# Patient Record
Sex: Male | Born: 1950
Health system: Southern US, Community
[De-identification: ages and names within clinical notes are randomized; demographics above are authoritative.]

## PROBLEM LIST (undated history)

## (undated) DIAGNOSIS — C8330 Diffuse large B-cell lymphoma, unspecified site: Secondary | ICD-10-CM

## (undated) DIAGNOSIS — I10 Essential (primary) hypertension: Secondary | ICD-10-CM

## (undated) DIAGNOSIS — I5032 Chronic diastolic (congestive) heart failure: Secondary | ICD-10-CM

## (undated) DIAGNOSIS — E119 Type 2 diabetes mellitus without complications: Secondary | ICD-10-CM

## (undated) DIAGNOSIS — I872 Venous insufficiency (chronic) (peripheral): Secondary | ICD-10-CM

## (undated) DIAGNOSIS — C8580 Other specified types of non-Hodgkin lymphoma, unspecified site: Secondary | ICD-10-CM

## (undated) DIAGNOSIS — D509 Iron deficiency anemia, unspecified: Secondary | ICD-10-CM

## (undated) DIAGNOSIS — E782 Mixed hyperlipidemia: Secondary | ICD-10-CM

## (undated) DIAGNOSIS — R609 Edema, unspecified: Secondary | ICD-10-CM

## (undated) HISTORY — DX: Morbid (severe) obesity due to excess calories: E66.01

## (undated) HISTORY — DX: Other specified types of non-hodgkin lymphoma, unspecified site: C85.80

## (undated) HISTORY — PX: LIMBAL STEM CELL TRANSPLANT: SHX1969

## (undated) HISTORY — DX: Iron deficiency anemia, unspecified: D50.9

## (undated) HISTORY — DX: Venous insufficiency (chronic) (peripheral): I87.2

## (undated) HISTORY — DX: Chronic diastolic (congestive) heart failure: I50.32

## (undated) HISTORY — DX: Mixed hyperlipidemia: E78.2

## (undated) HISTORY — DX: Diffuse large B-cell lymphoma, unspecified site: C83.30

## (undated) HISTORY — DX: Edema, unspecified: R60.9

## (undated) HISTORY — PX: OTHER SURGICAL HISTORY: SHX169

## (undated) HISTORY — DX: Essential (primary) hypertension: I10

---

## 2004-08-24 HISTORY — PX: BACK SURGERY: SHX140

## 2004-10-20 ENCOUNTER — Ambulatory Visit: Payer: Self-pay | Admitting: Internal Medicine

## 2004-11-24 ENCOUNTER — Ambulatory Visit: Payer: Self-pay | Admitting: Internal Medicine

## 2004-11-24 ENCOUNTER — Ambulatory Visit (HOSPITAL_COMMUNITY): Admission: RE | Admit: 2004-11-24 | Discharge: 2004-11-24 | Payer: Self-pay | Admitting: Internal Medicine

## 2004-11-24 HISTORY — PX: COLONOSCOPY: SHX174

## 2005-01-05 ENCOUNTER — Ambulatory Visit: Payer: Self-pay | Admitting: Cardiology

## 2005-01-09 ENCOUNTER — Ambulatory Visit (HOSPITAL_COMMUNITY): Admission: RE | Admit: 2005-01-09 | Discharge: 2005-01-09 | Payer: Self-pay | Admitting: Family Medicine

## 2005-01-13 ENCOUNTER — Ambulatory Visit (HOSPITAL_COMMUNITY): Admission: RE | Admit: 2005-01-13 | Discharge: 2005-01-13 | Payer: Self-pay | Admitting: Family Medicine

## 2005-01-14 ENCOUNTER — Ambulatory Visit (HOSPITAL_COMMUNITY): Admission: RE | Admit: 2005-01-14 | Discharge: 2005-01-14 | Payer: Self-pay | Admitting: Family Medicine

## 2005-01-15 ENCOUNTER — Encounter (HOSPITAL_COMMUNITY): Admission: RE | Admit: 2005-01-15 | Discharge: 2005-02-14 | Payer: Self-pay | Admitting: Family Medicine

## 2005-01-20 ENCOUNTER — Encounter (HOSPITAL_COMMUNITY): Admission: RE | Admit: 2005-01-20 | Discharge: 2005-02-19 | Payer: Self-pay | Admitting: Cardiology

## 2005-01-20 ENCOUNTER — Ambulatory Visit: Payer: Self-pay | Admitting: *Deleted

## 2005-01-22 DIAGNOSIS — C8580 Other specified types of non-Hodgkin lymphoma, unspecified site: Secondary | ICD-10-CM

## 2005-01-22 HISTORY — PX: LUNG BIOPSY: SHX232

## 2005-01-22 HISTORY — DX: Other specified types of non-hodgkin lymphoma, unspecified site: C85.80

## 2005-02-02 ENCOUNTER — Encounter: Payer: Self-pay | Admitting: Emergency Medicine

## 2005-02-02 ENCOUNTER — Inpatient Hospital Stay (HOSPITAL_COMMUNITY): Admission: EM | Admit: 2005-02-02 | Discharge: 2005-02-10 | Payer: Self-pay | Admitting: Neurological Surgery

## 2005-02-03 ENCOUNTER — Encounter (INDEPENDENT_AMBULATORY_CARE_PROVIDER_SITE_OTHER): Payer: Self-pay | Admitting: *Deleted

## 2005-02-04 ENCOUNTER — Encounter: Payer: Self-pay | Admitting: Thoracic Surgery

## 2005-02-23 ENCOUNTER — Encounter: Admission: RE | Admit: 2005-02-23 | Discharge: 2005-02-23 | Payer: Self-pay | Admitting: Oncology

## 2005-02-23 ENCOUNTER — Encounter (HOSPITAL_COMMUNITY): Admission: RE | Admit: 2005-02-23 | Discharge: 2005-03-25 | Payer: Self-pay | Admitting: Oncology

## 2005-02-26 ENCOUNTER — Ambulatory Visit (HOSPITAL_COMMUNITY): Admission: RE | Admit: 2005-02-26 | Discharge: 2005-02-26 | Payer: Self-pay | Admitting: Psychiatry

## 2005-03-02 ENCOUNTER — Ambulatory Visit (HOSPITAL_COMMUNITY): Admission: RE | Admit: 2005-03-02 | Discharge: 2005-03-02 | Payer: Self-pay | Admitting: Thoracic Surgery

## 2005-03-06 ENCOUNTER — Ambulatory Visit (HOSPITAL_COMMUNITY): Payer: Self-pay | Admitting: Oncology

## 2005-03-10 ENCOUNTER — Encounter: Admission: EM | Admit: 2005-03-10 | Discharge: 2005-03-10 | Payer: Self-pay | Admitting: Dentistry

## 2005-03-10 ENCOUNTER — Ambulatory Visit: Payer: Self-pay | Admitting: Dentistry

## 2005-03-27 ENCOUNTER — Encounter (HOSPITAL_COMMUNITY): Admission: RE | Admit: 2005-03-27 | Discharge: 2005-04-26 | Payer: Self-pay | Admitting: Oncology

## 2005-03-27 ENCOUNTER — Encounter: Admission: RE | Admit: 2005-03-27 | Discharge: 2005-03-27 | Payer: Self-pay | Admitting: Oncology

## 2005-04-21 ENCOUNTER — Ambulatory Visit (HOSPITAL_COMMUNITY): Payer: Self-pay | Admitting: Oncology

## 2005-04-24 HISTORY — PX: MULTIPLE TOOTH EXTRACTIONS: SHX2053

## 2005-05-04 ENCOUNTER — Ambulatory Visit (HOSPITAL_COMMUNITY): Admission: RE | Admit: 2005-05-04 | Discharge: 2005-05-04 | Payer: Self-pay | Admitting: Oncology

## 2005-05-05 ENCOUNTER — Encounter (HOSPITAL_COMMUNITY): Admission: RE | Admit: 2005-05-05 | Discharge: 2005-05-22 | Payer: Self-pay | Admitting: Oncology

## 2005-05-05 ENCOUNTER — Encounter: Admission: RE | Admit: 2005-05-05 | Discharge: 2005-05-22 | Payer: Self-pay | Admitting: Oncology

## 2005-05-07 ENCOUNTER — Ambulatory Visit (HOSPITAL_COMMUNITY): Admission: RE | Admit: 2005-05-07 | Discharge: 2005-05-07 | Payer: Self-pay | Admitting: Dentistry

## 2005-05-07 ENCOUNTER — Ambulatory Visit: Payer: Self-pay | Admitting: Dentistry

## 2005-05-25 ENCOUNTER — Ambulatory Visit: Payer: Self-pay | Admitting: Dentistry

## 2005-06-02 ENCOUNTER — Encounter (HOSPITAL_COMMUNITY): Admission: RE | Admit: 2005-06-02 | Discharge: 2005-07-02 | Payer: Self-pay | Admitting: Oncology

## 2005-06-02 ENCOUNTER — Encounter: Admission: RE | Admit: 2005-06-02 | Discharge: 2005-06-02 | Payer: Self-pay | Admitting: Oncology

## 2005-06-17 ENCOUNTER — Ambulatory Visit (HOSPITAL_COMMUNITY): Payer: Self-pay | Admitting: Oncology

## 2005-07-10 ENCOUNTER — Encounter: Admission: RE | Admit: 2005-07-10 | Discharge: 2005-07-10 | Payer: Self-pay | Admitting: Oncology

## 2005-07-10 ENCOUNTER — Encounter (HOSPITAL_COMMUNITY): Admission: RE | Admit: 2005-07-10 | Discharge: 2005-08-09 | Payer: Self-pay | Admitting: Oncology

## 2005-07-13 ENCOUNTER — Encounter (HOSPITAL_COMMUNITY): Payer: Self-pay | Admitting: Oncology

## 2005-08-03 ENCOUNTER — Ambulatory Visit (HOSPITAL_COMMUNITY): Payer: Self-pay | Admitting: Oncology

## 2005-08-11 ENCOUNTER — Encounter (HOSPITAL_COMMUNITY): Admission: RE | Admit: 2005-08-11 | Discharge: 2005-08-13 | Payer: Self-pay | Admitting: Oncology

## 2005-08-11 ENCOUNTER — Encounter: Admission: RE | Admit: 2005-08-11 | Discharge: 2005-08-13 | Payer: Self-pay | Admitting: Oncology

## 2005-08-25 ENCOUNTER — Encounter: Admission: RE | Admit: 2005-08-25 | Discharge: 2005-08-25 | Payer: Self-pay | Admitting: Oncology

## 2005-08-25 ENCOUNTER — Encounter (HOSPITAL_COMMUNITY): Admission: RE | Admit: 2005-08-25 | Discharge: 2005-09-24 | Payer: Self-pay | Admitting: Oncology

## 2005-08-31 ENCOUNTER — Ambulatory Visit (HOSPITAL_COMMUNITY): Admission: RE | Admit: 2005-08-31 | Discharge: 2005-08-31 | Payer: Self-pay | Admitting: Oncology

## 2005-09-02 ENCOUNTER — Ambulatory Visit: Admission: RE | Admit: 2005-09-02 | Discharge: 2005-09-28 | Payer: Self-pay | Admitting: Radiation Oncology

## 2005-09-08 ENCOUNTER — Ambulatory Visit: Payer: Self-pay | Admitting: Internal Medicine

## 2005-09-09 ENCOUNTER — Ambulatory Visit: Payer: Self-pay | Admitting: Dentistry

## 2005-09-22 ENCOUNTER — Ambulatory Visit (HOSPITAL_COMMUNITY): Payer: Self-pay | Admitting: Oncology

## 2005-09-23 ENCOUNTER — Ambulatory Visit: Payer: Self-pay | Admitting: Dentistry

## 2005-10-06 ENCOUNTER — Encounter (HOSPITAL_COMMUNITY): Admission: RE | Admit: 2005-10-06 | Discharge: 2005-11-05 | Payer: Self-pay | Admitting: Oncology

## 2005-10-06 ENCOUNTER — Encounter: Admission: RE | Admit: 2005-10-06 | Discharge: 2005-10-06 | Payer: Self-pay | Admitting: Oncology

## 2005-10-15 ENCOUNTER — Ambulatory Visit: Payer: Self-pay | Admitting: Internal Medicine

## 2005-11-05 ENCOUNTER — Encounter: Admission: RE | Admit: 2005-11-05 | Discharge: 2005-11-05 | Payer: Self-pay | Admitting: Oncology

## 2005-11-05 ENCOUNTER — Encounter (HOSPITAL_COMMUNITY): Admission: RE | Admit: 2005-11-05 | Discharge: 2005-12-05 | Payer: Self-pay | Admitting: Oncology

## 2005-11-09 ENCOUNTER — Ambulatory Visit (HOSPITAL_COMMUNITY): Payer: Self-pay | Admitting: Oncology

## 2005-12-17 ENCOUNTER — Encounter: Admission: RE | Admit: 2005-12-17 | Discharge: 2005-12-17 | Payer: Self-pay | Admitting: Oncology

## 2006-01-01 ENCOUNTER — Ambulatory Visit (HOSPITAL_COMMUNITY): Payer: Self-pay | Admitting: Oncology

## 2006-01-29 ENCOUNTER — Encounter: Admission: RE | Admit: 2006-01-29 | Discharge: 2006-01-29 | Payer: Self-pay | Admitting: Oncology

## 2006-01-29 ENCOUNTER — Encounter (HOSPITAL_COMMUNITY): Admission: RE | Admit: 2006-01-29 | Discharge: 2006-02-28 | Payer: Self-pay | Admitting: Oncology

## 2006-04-05 ENCOUNTER — Encounter (HOSPITAL_COMMUNITY): Admission: RE | Admit: 2006-04-05 | Discharge: 2006-05-05 | Payer: Self-pay | Admitting: Oncology

## 2006-04-05 ENCOUNTER — Encounter: Admission: RE | Admit: 2006-04-05 | Discharge: 2006-04-05 | Payer: Self-pay | Admitting: Oncology

## 2006-04-05 ENCOUNTER — Ambulatory Visit (HOSPITAL_COMMUNITY): Payer: Self-pay | Admitting: Oncology

## 2006-05-07 ENCOUNTER — Encounter: Admission: RE | Admit: 2006-05-07 | Discharge: 2006-05-21 | Payer: Self-pay | Admitting: Oncology

## 2006-05-07 ENCOUNTER — Encounter (HOSPITAL_COMMUNITY): Admission: RE | Admit: 2006-05-07 | Discharge: 2006-05-21 | Payer: Self-pay | Admitting: Oncology

## 2006-06-01 ENCOUNTER — Ambulatory Visit (HOSPITAL_COMMUNITY): Payer: Self-pay | Admitting: Oncology

## 2006-06-01 ENCOUNTER — Encounter: Admission: RE | Admit: 2006-06-01 | Discharge: 2006-06-01 | Payer: Self-pay | Admitting: Oncology

## 2006-06-01 ENCOUNTER — Encounter (HOSPITAL_COMMUNITY): Admission: RE | Admit: 2006-06-01 | Discharge: 2006-07-01 | Payer: Self-pay | Admitting: Oncology

## 2006-06-07 ENCOUNTER — Ambulatory Visit: Payer: Self-pay | Admitting: Cardiology

## 2006-06-09 ENCOUNTER — Ambulatory Visit (HOSPITAL_COMMUNITY): Admission: RE | Admit: 2006-06-09 | Discharge: 2006-06-09 | Payer: Self-pay | Admitting: Cardiology

## 2006-06-09 ENCOUNTER — Ambulatory Visit: Payer: Self-pay | Admitting: Cardiology

## 2006-07-06 ENCOUNTER — Encounter: Admission: RE | Admit: 2006-07-06 | Discharge: 2006-07-06 | Payer: Self-pay | Admitting: Thoracic Surgery

## 2006-07-07 ENCOUNTER — Ambulatory Visit: Payer: Self-pay | Admitting: Cardiology

## 2006-07-29 ENCOUNTER — Ambulatory Visit: Admission: RE | Admit: 2006-07-29 | Discharge: 2006-07-29 | Payer: Self-pay | Admitting: Thoracic Surgery

## 2006-09-02 ENCOUNTER — Ambulatory Visit (HOSPITAL_COMMUNITY): Payer: Self-pay | Admitting: Oncology

## 2006-09-02 ENCOUNTER — Encounter (HOSPITAL_COMMUNITY): Admission: RE | Admit: 2006-09-02 | Discharge: 2006-10-02 | Payer: Self-pay | Admitting: Oncology

## 2006-09-07 ENCOUNTER — Ambulatory Visit (HOSPITAL_COMMUNITY): Admission: RE | Admit: 2006-09-07 | Discharge: 2006-09-07 | Payer: Self-pay | Admitting: Thoracic Surgery

## 2006-10-14 ENCOUNTER — Encounter (HOSPITAL_COMMUNITY): Admission: RE | Admit: 2006-10-14 | Discharge: 2006-11-13 | Payer: Self-pay | Admitting: Oncology

## 2006-10-18 ENCOUNTER — Ambulatory Visit (HOSPITAL_COMMUNITY): Payer: Self-pay | Admitting: Oncology

## 2006-10-26 ENCOUNTER — Ambulatory Visit: Payer: Self-pay | Admitting: Dentistry

## 2006-12-21 ENCOUNTER — Ambulatory Visit (HOSPITAL_COMMUNITY): Payer: Self-pay | Admitting: Oncology

## 2006-12-21 ENCOUNTER — Encounter (HOSPITAL_COMMUNITY): Admission: RE | Admit: 2006-12-21 | Discharge: 2007-01-20 | Payer: Self-pay | Admitting: Oncology

## 2007-01-31 ENCOUNTER — Ambulatory Visit: Payer: Self-pay | Admitting: Dentistry

## 2007-03-22 ENCOUNTER — Ambulatory Visit: Payer: Self-pay | Admitting: Dentistry

## 2007-04-21 ENCOUNTER — Encounter (HOSPITAL_COMMUNITY): Admission: RE | Admit: 2007-04-21 | Discharge: 2007-05-21 | Payer: Self-pay | Admitting: Oncology

## 2007-04-21 ENCOUNTER — Ambulatory Visit (HOSPITAL_COMMUNITY): Payer: Self-pay | Admitting: Oncology

## 2007-06-16 ENCOUNTER — Ambulatory Visit: Payer: Self-pay | Admitting: Dentistry

## 2007-06-30 ENCOUNTER — Ambulatory Visit (HOSPITAL_COMMUNITY): Payer: Self-pay | Admitting: Oncology

## 2007-07-25 ENCOUNTER — Ambulatory Visit: Payer: Self-pay | Admitting: Dentistry

## 2007-09-20 ENCOUNTER — Ambulatory Visit: Payer: Self-pay | Admitting: Dentistry

## 2007-09-22 ENCOUNTER — Ambulatory Visit (HOSPITAL_COMMUNITY): Payer: Self-pay | Admitting: Oncology

## 2007-09-22 ENCOUNTER — Encounter (HOSPITAL_COMMUNITY): Admission: RE | Admit: 2007-09-22 | Discharge: 2007-10-22 | Payer: Self-pay | Admitting: Oncology

## 2007-11-03 ENCOUNTER — Encounter (HOSPITAL_COMMUNITY): Admission: RE | Admit: 2007-11-03 | Discharge: 2007-12-03 | Payer: Self-pay | Admitting: Oncology

## 2007-11-10 ENCOUNTER — Ambulatory Visit (HOSPITAL_COMMUNITY): Payer: Self-pay | Admitting: Oncology

## 2008-01-18 ENCOUNTER — Ambulatory Visit (HOSPITAL_COMMUNITY): Payer: Self-pay | Admitting: Oncology

## 2008-02-20 ENCOUNTER — Ambulatory Visit: Payer: Self-pay | Admitting: Dentistry

## 2008-04-11 ENCOUNTER — Ambulatory Visit (HOSPITAL_COMMUNITY): Payer: Self-pay | Admitting: Oncology

## 2008-05-23 ENCOUNTER — Encounter (HOSPITAL_COMMUNITY): Admission: RE | Admit: 2008-05-23 | Discharge: 2008-06-22 | Payer: Self-pay | Admitting: Oncology

## 2008-05-30 ENCOUNTER — Ambulatory Visit (HOSPITAL_COMMUNITY): Payer: Self-pay | Admitting: Oncology

## 2008-10-11 ENCOUNTER — Ambulatory Visit (HOSPITAL_COMMUNITY): Payer: Self-pay | Admitting: Oncology

## 2008-10-19 ENCOUNTER — Ambulatory Visit (HOSPITAL_COMMUNITY): Admission: RE | Admit: 2008-10-19 | Discharge: 2008-10-19 | Payer: Self-pay | Admitting: Family Medicine

## 2008-10-24 ENCOUNTER — Ambulatory Visit (HOSPITAL_COMMUNITY): Admission: RE | Admit: 2008-10-24 | Discharge: 2008-10-24 | Payer: Self-pay | Admitting: Family Medicine

## 2008-11-01 ENCOUNTER — Ambulatory Visit: Payer: Self-pay | Admitting: Cardiology

## 2008-11-06 ENCOUNTER — Encounter: Payer: Self-pay | Admitting: Cardiology

## 2008-11-06 ENCOUNTER — Ambulatory Visit: Payer: Self-pay | Admitting: Cardiology

## 2008-11-06 ENCOUNTER — Ambulatory Visit (HOSPITAL_COMMUNITY): Admission: RE | Admit: 2008-11-06 | Discharge: 2008-11-06 | Payer: Self-pay | Admitting: Cardiology

## 2008-11-13 ENCOUNTER — Encounter (HOSPITAL_COMMUNITY): Admission: RE | Admit: 2008-11-13 | Discharge: 2008-12-13 | Payer: Self-pay | Admitting: Oncology

## 2008-12-03 ENCOUNTER — Ambulatory Visit: Payer: Self-pay | Admitting: Cardiology

## 2009-01-29 ENCOUNTER — Ambulatory Visit (HOSPITAL_COMMUNITY): Payer: Self-pay | Admitting: Oncology

## 2009-02-28 DIAGNOSIS — D649 Anemia, unspecified: Secondary | ICD-10-CM

## 2009-02-28 DIAGNOSIS — R609 Edema, unspecified: Secondary | ICD-10-CM

## 2009-02-28 DIAGNOSIS — E782 Mixed hyperlipidemia: Secondary | ICD-10-CM | POA: Insufficient documentation

## 2009-02-28 DIAGNOSIS — C8330 Diffuse large B-cell lymphoma, unspecified site: Secondary | ICD-10-CM

## 2009-02-28 DIAGNOSIS — I10 Essential (primary) hypertension: Secondary | ICD-10-CM | POA: Insufficient documentation

## 2009-02-28 DIAGNOSIS — E785 Hyperlipidemia, unspecified: Secondary | ICD-10-CM | POA: Insufficient documentation

## 2009-02-28 DIAGNOSIS — I5032 Chronic diastolic (congestive) heart failure: Secondary | ICD-10-CM

## 2009-02-28 HISTORY — DX: Diffuse large B-cell lymphoma, unspecified site: C83.30

## 2009-04-17 ENCOUNTER — Ambulatory Visit (HOSPITAL_COMMUNITY): Payer: Self-pay | Admitting: Oncology

## 2009-04-17 ENCOUNTER — Encounter (HOSPITAL_COMMUNITY): Admission: RE | Admit: 2009-04-17 | Discharge: 2009-05-17 | Payer: Self-pay | Admitting: Oncology

## 2009-04-24 ENCOUNTER — Encounter: Payer: Self-pay | Admitting: Cardiology

## 2009-06-05 ENCOUNTER — Ambulatory Visit (HOSPITAL_COMMUNITY): Payer: Self-pay | Admitting: Oncology

## 2009-08-22 ENCOUNTER — Ambulatory Visit (HOSPITAL_COMMUNITY): Payer: Self-pay | Admitting: Oncology

## 2009-10-23 ENCOUNTER — Telehealth (INDEPENDENT_AMBULATORY_CARE_PROVIDER_SITE_OTHER): Payer: Self-pay

## 2009-10-23 ENCOUNTER — Encounter (INDEPENDENT_AMBULATORY_CARE_PROVIDER_SITE_OTHER): Payer: Self-pay

## 2009-10-23 ENCOUNTER — Ambulatory Visit (HOSPITAL_COMMUNITY): Payer: Self-pay | Admitting: Oncology

## 2009-10-23 ENCOUNTER — Encounter: Payer: Self-pay | Admitting: Cardiology

## 2009-10-23 ENCOUNTER — Encounter: Payer: Self-pay | Admitting: Internal Medicine

## 2009-10-31 ENCOUNTER — Encounter (INDEPENDENT_AMBULATORY_CARE_PROVIDER_SITE_OTHER): Payer: Self-pay

## 2009-11-14 ENCOUNTER — Encounter (HOSPITAL_COMMUNITY): Admission: RE | Admit: 2009-11-14 | Discharge: 2009-12-14 | Payer: Self-pay | Admitting: Oncology

## 2009-12-30 ENCOUNTER — Ambulatory Visit (HOSPITAL_COMMUNITY): Payer: Self-pay | Admitting: Oncology

## 2010-03-24 ENCOUNTER — Ambulatory Visit (HOSPITAL_COMMUNITY): Payer: Self-pay | Admitting: Oncology

## 2010-04-29 ENCOUNTER — Encounter (HOSPITAL_COMMUNITY): Admission: RE | Admit: 2010-04-29 | Discharge: 2010-05-23 | Payer: Self-pay | Admitting: Oncology

## 2010-05-07 ENCOUNTER — Encounter: Payer: Self-pay | Admitting: Cardiology

## 2010-07-10 ENCOUNTER — Ambulatory Visit (HOSPITAL_COMMUNITY): Payer: Self-pay | Admitting: Oncology

## 2010-07-10 ENCOUNTER — Encounter (HOSPITAL_COMMUNITY)
Admission: RE | Admit: 2010-07-10 | Discharge: 2010-08-09 | Payer: Self-pay | Source: Home / Self Care | Attending: Oncology | Admitting: Oncology

## 2010-08-28 ENCOUNTER — Encounter (HOSPITAL_COMMUNITY)
Admission: RE | Admit: 2010-08-28 | Discharge: 2010-09-23 | Payer: Self-pay | Source: Home / Self Care | Attending: Oncology | Admitting: Oncology

## 2010-08-28 ENCOUNTER — Ambulatory Visit (HOSPITAL_COMMUNITY)
Admission: RE | Admit: 2010-08-28 | Discharge: 2010-09-23 | Payer: Self-pay | Source: Home / Self Care | Attending: Oncology | Admitting: Oncology

## 2010-09-14 ENCOUNTER — Encounter: Payer: Self-pay | Admitting: Thoracic Surgery

## 2010-09-14 ENCOUNTER — Encounter: Payer: Self-pay | Admitting: Cardiology

## 2010-09-14 ENCOUNTER — Encounter (HOSPITAL_COMMUNITY): Payer: Self-pay | Admitting: Oncology

## 2010-09-23 NOTE — Miscellaneous (Signed)
Summary: 11/24/2004   Colonoscopy with biopsy  Clinical Lists Changes NAME:  [name], Frank                  ACCOUNT NO.:  [id number]   MEDICAL RECORD NO.:  [id number]          PATIENT TYPE:  AMB   LOCATION:  DAY                           FACILITY:  [facility]   PHYSICIAN:  R. Roetta Sessions, M.D. DATE OF BIRTH:  [birth year]   DATE OF PROCEDURE:  11/24/2004  DATE OF DISCHARGE:                                 OPERATIVE REPORT   PROCEDURE PERFORMED:  Colonoscopy with biopsy and snare polypectomy.   INDICATIONS FOR PROCEDURE:  The patient is a 60 year old gentleman referred  by Patrica Duel, M.D. for colorectal cancer screening.  He had a  colonoscopy back in 1994.  He had some hyperplastic polyps taken out, no  family history of colorectal neoplasia.  He has no bowel symptoms.  Colonoscopy is now being done.  This approach has been discussed with the  patient at length,  Potential risks, benefits and alternatives have been  reviewed, questions answered, he is agreeable.  Please see documentation in  the medical record.   DESCRIPTION OF PROCEDURE:  Oxygen saturations, blood pressure, pulse and  respirations were monitored throughout the entire procedure.  Conscious  sedation with Versed 5 mg IV, Demerol 75 mg IVl.   INSTRUMENT USED:  Olympus video chip system.   FINDINGS:  Digital rectal examination revealed no abnormalities.   ENDOSCOPIC FINDINGS:  Prep was adequate.   Rectum:  Examination of the rectal mucosa including retroflex view of the  anal verge revealed no abnormalities.  Colon:  Colonic mucosa surveyed from the rectosigmoid junction through the  left, transverse, right colon to the area of the appendiceal orifice,  ileocecal valve and cecum.  These structures were well seen and photographed  for the record.  From this level the scope was slowly withdrawn.  All  previously mentioned mucosal surfaces were again seen.  At 30 cm there was a  diminutive 4 mm polyp which was removed  with cold biopsy forceps.  At 15 cm  there was a 7 mm somewhat pedunculated polyp which was removed  with cold  snare technique.  There was also two 2 cm submucosal lesions consistent with  lipomas.  One in the ascending colon and one in the mid descending colon  which were photographed but not manipulated.  The patient tolerated the  procedure well and was reacted in endoscopy.   IMPRESSION:  1.  Normal rectum.  2.  Polyps in the left colon ablated/removed as described above.  Two      submucosal lesions consistent with lipomas as described above not      manipulated.   RECOMMENDATIONS:  1.  Follow-up on pathology.  2.  Further recommendations to follow.      RMR/MEDQ  D:  11/24/2004  T:  11/24/2004  Job:  [id number]   cc:   Patrica Duel, M.D.  [address], [address]  [place]  [place] [address]  Fax: [phone]

## 2010-09-23 NOTE — Letter (Signed)
Summary: Internal Other/ Colon Path..11/2004  Internal Other/ Colon Path..11/2004   Imported By: Cloria Spring LPN 71/24/5809 98:33:82  _____________________________________________________________________  External Attachment:    Type:   Image     Comment:   External Document

## 2010-09-23 NOTE — Letter (Signed)
Summary: Recall Colonoscopy/Endoscopy, Change to Office Visit  [facility]  [address]   [place], [place] [address]   Phone: [phone]  Fax: [phone]      October 31, 2009   [facility] [address] [place], [place]  [address] [birth year]   Dear Mr. Adkison,   According to our records, it is time for you to schedule your next colonoscopy. Please call our office and ask to speak to the nurse to get triaged for scheduling your procedure.  You can reach [place] at [phone].   Sincerely,   Cloria Spring LPN  [facility] Ph: [phone]   Fax: [phone]

## 2010-09-23 NOTE — Letter (Signed)
Summary: [facility] Progress Note  [facility] Progress Note   Imported By: Roderic Ovens 11/28/2009 12:03:39  _____________________________________________________________________  External Attachment:    Type:   Image     Comment:   External Document

## 2010-09-23 NOTE — Letter (Signed)
Summary: [facility]  [facility]   Imported By: Marylou Mccoy 06/27/2010 09:47:49  _____________________________________________________________________  External Attachment:    Type:   Image     Comment:   External Document

## 2010-09-23 NOTE — Progress Notes (Signed)
Summary: Next TCS due 11/2009/ copies of TCS/Path of 11/2004 to Dr Lanae Boast  Phone Note Outgoing Call   Call placed by: Tyler Aas Call placed to: Patient Summary of Call: Per Dr. Jena Gauss, pt is due  next TCS in 11/2009. ( He is on April call back list). LMOM for pt to call and inform  him of that. Also,  paper copies of the Colonoscopy and path of 11/2004 were faxed to Dr. Mariel Sleet per Dr. Luvenia Starch request, for continuity  of care. Initial call taken by: Cloria Spring LPN,  October 23, 2009 4:44 PM     Appended Document: Next TCS due 11/2009/ copies of TCS/Path of 11/2004 to Dr Lanae Boast [facility] to call.  Appended Document: Next TCS due 11/2009/ copies of TCS/Path of 11/2004 to Dr Lanae Boast [facility] to call.

## 2010-10-09 ENCOUNTER — Other Ambulatory Visit (HOSPITAL_COMMUNITY): Payer: Medicare Other

## 2010-10-09 ENCOUNTER — Encounter (HOSPITAL_COMMUNITY): Payer: Medicare Other | Attending: Oncology

## 2010-10-09 DIAGNOSIS — C8589 Other specified types of non-Hodgkin lymphoma, extranodal and solid organ sites: Secondary | ICD-10-CM | POA: Insufficient documentation

## 2010-10-09 DIAGNOSIS — Z9481 Bone marrow transplant status: Secondary | ICD-10-CM | POA: Insufficient documentation

## 2010-10-09 DIAGNOSIS — Z9221 Personal history of antineoplastic chemotherapy: Secondary | ICD-10-CM | POA: Insufficient documentation

## 2010-10-09 DIAGNOSIS — Z452 Encounter for adjustment and management of vascular access device: Secondary | ICD-10-CM

## 2010-11-05 ENCOUNTER — Ambulatory Visit (HOSPITAL_COMMUNITY): Payer: Medicare Other | Admitting: Oncology

## 2010-11-05 DIAGNOSIS — C8589 Other specified types of non-Hodgkin lymphoma, extranodal and solid organ sites: Secondary | ICD-10-CM

## 2010-11-06 LAB — COMPREHENSIVE METABOLIC PANEL
Albumin: 3.7 g/dL (ref 3.5–5.2)
BUN: 7 mg/dL (ref 6–23)
Calcium: 8.6 mg/dL (ref 8.4–10.5)
Creatinine, Ser: 0.98 mg/dL (ref 0.4–1.5)
GFR calc Af Amer: >60 mL/min (ref >60)
GFR calc non Af Amer: >60 mL/min (ref >60)
Glucose, Bld: 101 mg/dL — ABNORMAL HIGH (ref 70–99)
Potassium: 3.5 mEq/L (ref 3.5–5.1)
Sodium: 140 mEq/L (ref 135–145)

## 2010-11-06 LAB — CBC
HCT: 36.4 % — ABNORMAL LOW (ref 39.0–52.0)
Hemoglobin: 12.3 g/dL — ABNORMAL LOW (ref 13.0–17.0)
MCH: 31.4 pg (ref 26.0–34.0)
MCHC: 33.8 g/dL (ref 30.0–36.0)
MCV: 92.9 fL (ref 78.0–100.0)
Platelets: 207 10*3/uL (ref 150–400)
RDW: 16.4 % — ABNORMAL HIGH (ref 11.5–15.5)

## 2010-11-06 LAB — DIFFERENTIAL
Basophils Absolute: 0 10*3/uL (ref 0.0–0.1)
Basophils Relative: 1 % (ref 0–1)
Monocytes Absolute: 0.7 10*3/uL (ref 0.1–1.0)
Monocytes Relative: 16 % — ABNORMAL HIGH (ref 3–12)
Neutro Abs: 2.4 10*3/uL (ref 1.7–7.7)

## 2010-11-06 LAB — LACTATE DEHYDROGENASE: LDH: 171 U/L (ref 94–250)

## 2010-11-06 LAB — SEDIMENTATION RATE: Sed Rate: 84 mm/hr — ABNORMAL HIGH (ref 0–16)

## 2010-11-16 LAB — COMPREHENSIVE METABOLIC PANEL
ALT: 17 U/L (ref 0–53)
AST: 17 U/L (ref 0–37)
Albumin: 3.7 g/dL (ref 3.5–5.2)
Alkaline Phosphatase: 72 U/L (ref 39–117)
Calcium: 9 mg/dL (ref 8.4–10.5)
GFR calc Af Amer: >60 mL/min (ref >60)
Potassium: 3.9 mEq/L (ref 3.5–5.1)
Sodium: 140 mEq/L (ref 135–145)
Total Protein: 6.1 g/dL (ref 6.0–8.3)

## 2010-11-16 LAB — CBC
HCT: 36.8 % — ABNORMAL LOW (ref 39.0–52.0)
Hemoglobin: 12.8 g/dL — ABNORMAL LOW (ref 13.0–17.0)
MCHC: 34.8 g/dL (ref 30.0–36.0)
MCV: 92.1 fL (ref 78.0–100.0)
Platelets: 189 10*3/uL (ref 150–400)
RBC: 3.99 MIL/uL — ABNORMAL LOW (ref 4.22–5.81)
RDW: 16.1 % — ABNORMAL HIGH (ref 11.5–15.5)
WBC: 3.9 10*3/uL — ABNORMAL LOW (ref 4.0–10.5)

## 2010-11-16 LAB — LACTATE DEHYDROGENASE: LDH: 154 U/L (ref 94–250)

## 2010-11-16 LAB — DIFFERENTIAL
Eosinophils Absolute: 0.1 10*3/uL (ref 0.0–0.7)
Eosinophils Relative: 3 % (ref 0–5)
Lymphs Abs: 1 10*3/uL (ref 0.7–4.0)
Monocytes Absolute: 0.5 10*3/uL (ref 0.1–1.0)
Monocytes Relative: 13 % — ABNORMAL HIGH (ref 3–12)
Neutrophils Relative %: 56 % (ref 43–77)

## 2010-11-20 ENCOUNTER — Encounter (HOSPITAL_COMMUNITY): Payer: Medicare Other

## 2010-11-20 DIAGNOSIS — Z452 Encounter for adjustment and management of vascular access device: Secondary | ICD-10-CM

## 2010-11-20 DIAGNOSIS — C8589 Other specified types of non-Hodgkin lymphoma, extranodal and solid organ sites: Secondary | ICD-10-CM

## 2010-11-29 LAB — CBC
HCT: 33.8 % — ABNORMAL LOW (ref 39.0–52.0)
Hemoglobin: 11.7 g/dL — ABNORMAL LOW (ref 13.0–17.0)
MCV: 94 fL (ref 78.0–100.0)
RDW: 16 % — ABNORMAL HIGH (ref 11.5–15.5)

## 2010-11-29 LAB — COMPREHENSIVE METABOLIC PANEL
Alkaline Phosphatase: 70 U/L (ref 39–117)
BUN: 8 mg/dL (ref 6–23)
Creatinine, Ser: 1.1 mg/dL (ref 0.4–1.5)
Glucose, Bld: 118 mg/dL — ABNORMAL HIGH (ref 70–99)
Potassium: 3.5 mEq/L (ref 3.5–5.1)
Total Bilirubin: 0.3 mg/dL (ref 0.3–1.2)
Total Protein: 5.4 g/dL — ABNORMAL LOW (ref 6.0–8.3)

## 2010-11-29 LAB — DIFFERENTIAL
Basophils Absolute: 0 10*3/uL (ref 0.0–0.1)
Basophils Relative: 0 % (ref 0–1)
Monocytes Relative: 15 % — ABNORMAL HIGH (ref 3–12)
Neutro Abs: 1.9 10*3/uL (ref 1.7–7.7)
Neutrophils Relative %: 54 % (ref 43–77)

## 2010-12-04 LAB — COMPREHENSIVE METABOLIC PANEL
ALT: 22 U/L (ref 0–53)
AST: 22 U/L (ref 0–37)
CO2: 26 mEq/L (ref 19–32)
Chloride: 107 mEq/L (ref 96–112)
GFR calc Af Amer: >60 mL/min (ref >60)
GFR calc non Af Amer: >60 mL/min (ref >60)
Sodium: 138 mEq/L (ref 135–145)
Total Bilirubin: 0.3 mg/dL (ref 0.3–1.2)

## 2010-12-04 LAB — CBC
RBC: 3.69 MIL/uL — ABNORMAL LOW (ref 4.22–5.81)
WBC: 3.5 10*3/uL — ABNORMAL LOW (ref 4.0–10.5)

## 2010-12-04 LAB — DIFFERENTIAL
Basophils Absolute: 0 10*3/uL (ref 0.0–0.1)
Eosinophils Absolute: 0.2 10*3/uL (ref 0.0–0.7)
Eosinophils Relative: 5 % (ref 0–5)
Lymphs Abs: 0.8 10*3/uL (ref 0.7–4.0)

## 2010-12-04 LAB — LACTATE DEHYDROGENASE: LDH: 174 U/L (ref 94–250)

## 2011-01-01 ENCOUNTER — Encounter (HOSPITAL_COMMUNITY): Payer: Medicare Other

## 2011-01-01 ENCOUNTER — Encounter (HOSPITAL_COMMUNITY): Payer: Medicare Other | Attending: Oncology

## 2011-01-01 DIAGNOSIS — Z452 Encounter for adjustment and management of vascular access device: Secondary | ICD-10-CM

## 2011-01-01 DIAGNOSIS — C8589 Other specified types of non-Hodgkin lymphoma, extranodal and solid organ sites: Secondary | ICD-10-CM | POA: Insufficient documentation

## 2011-01-01 DIAGNOSIS — Z9481 Bone marrow transplant status: Secondary | ICD-10-CM | POA: Insufficient documentation

## 2011-01-01 DIAGNOSIS — Z9221 Personal history of antineoplastic chemotherapy: Secondary | ICD-10-CM | POA: Insufficient documentation

## 2011-01-06 NOTE — Assessment & Plan Note (Signed)
[facility]                       [place] CARDIOLOGY OFFICE NOTE   [name], [name]                         MRN:          [id number]  DATE:12/03/2008                            DOB:          Oct 10, 1950    [name] comes in today for his chronic diastolic heart failure and  particularly his lower extremity edema.   On his last visit, we repeated a 2-D echocardiogram which showed  basically normal left ventricular chamber size and function, mild LVH,  no important valvular disease, mild right ventricular hypertrophy with  normal RV function, and no suggestion of pulmonary hypertension.   He takes torsemide 20 mg a day and potassium 20 mEq a day.  Apparently,  his potassium is low recently and Dr. Mariel Sleet added this supplement.  Unfortunately, he does not take his torsemide everyday.  His weight is  fluctuating as much as 10-12 pounds since last week.  He did not take  his torsemide yesterday because of church and he did not take it today  because he is coming to see [place].   PHYSICAL EXAMINATION:  VITAL SIGNS:  Today, his blood pressure is  130/78, his pulse is 90 and regular, his weight is 305, which is  identical to a month ago.  He is 5 feet 11.  HEENT:  Normal.  NECK:  I could not assess JVD.  Thyroid is not enlarged.  Trachea is  midline.  LUNGS:  Clear to auscultation and percussion.  HEART:  Poorly-appreciated PMI.  Normal S1 and S2.  No murmur, rub, or  gallop.  ABDOMEN:  Protuberant, good bowel sounds.  No midline bruit.  No  hepatomegaly.  EXTREMITIES:  No cyanosis or clubbing.  He does have tight peripheral  edema.  There are some brawny changes occurring with 2+ pitting.  There  is no tenderness.  Pulses were palpable, but reduced.  No definite sign  of DVT.   ASSESSMENT AND PLAN:  I had a long talk with [name] today.  He  clearly is taking his torsemide everyday along with his potassium.  He  says when he does that, his legs  were actually much less tense.  I  talked to him about the complications and consequences of his legs being  this tense and tight including risk of lower extremity clots,  cellulitis, or weeping an infection.   I have made no changes in his program.  I will plan on seeing him back  again in 6 months.     Thomas C. Daleen Squibb, MD, [facility]  Electronically Signed    TCW/MedQ  DD: 12/03/2008  DT: 12/04/2008  Job #: [id number]

## 2011-01-06 NOTE — Assessment & Plan Note (Signed)
[facility]                       [place] CARDIOLOGY OFFICE NOTE   [name], [name]                         MRN:          [id number]  DATE:11/01/2008                            DOB:          1950/12/11    [name] comes in today because of increasing lower extremity edema.  He saw Dr. Patrica Duel the other day who is seen him over for  evaluation.   Dr. Nobie Putnam obtained a chest x-rays which showed no acute abnormalities  and no cardiomegaly or evidence of any pleural effusion.  In addition,  he obtained lower extremity Doppler studies which showed no sign of DVT  bilaterally.   [name] was seen by Dr. Dietrich Pates in October 2007 for lower extremity  edema as well.  At that time, he had a 2-D echocardiogram which showed  an EF of 40-45% with some mild global hypokinesia.  He had normal right-  sided structures and function.  There was no pericardial effusion.  He  also had a stress nuclear study, May 2006 which basically showed  relatively good exercise tolerance with no ischemia, EF 62%.   He denies any orthopnea or PND.  He has had no hemoptysis or cough.  He  denies any tachy palpitations, presyncope, or syncope.  He has had no  symptoms of angina or ischemia other than some dyspnea on exertion and  shortness of breath.   Dr. Nobie Putnam started torsemide 20 mg per day and potassium 10 mg a day.   He has been taking the torsemide on an irregular basis based on his  schedule for taking his kids to school or his wife to [place].  The  first week, he took it religiously and he noticed a significant  difference in his edema as well as some paresthesias in his feet.   He is on no other medications.   PHYSICAL EXAMINATION:  GENERAL:  He is a pleasant gentleman, in no acute  distress.  He is markedly overweight.  VITAL SIGNS:  His blood pressure is 132/72, his pulse 76 and regular,  his weight is 305.  He was 246 when he was seen by Dr.  Dietrich Pates in 2007.  HEENT:  He has a beard otherwise.  Sclerae are muddy, but negative HEENT  otherwise.  He does have some missing teeth, but they are in good repair  otherwise.  NECK:  Large JVD, could not be really adequately assessed.  Carotid  upstrokes were equal bilaterally without bruits, no JVD.  Thyroid is not  enlarged.  LUNGS:  Clear to auscultation and percussion.  HEART:  Nondisplaced PMI, which is somewhat difficult to palpate.  There  is no obvious S3 gallop.  There is no obvious right-sided lift.  ABDOMEN:  Obese with good bowel sounds.  Organomegaly is difficult to  assess.  EXTREMITIES:  Chronic tight peripheral edema with some skin changes.  He  does have 1-2+ pitting edema on top of this in the pretibial and ankles.  His pulses were 2+/4+ dorsalis pedis and posterior tibia.  His capillary  refill is normal.  There was no  tenderness to calf squeezing.  NEURO:  Intact.   ASSESSMENT:  1. Chronic lower extremity edema now increased over the last several      months.  Rule out any significant cardiac deterioration or      pulmonary hypertension.  2. Good initial response to torsemide and potassium if he takes it on      a regular basis.  3. Morbid obese.  4. History of lymphoma, status post marrow transplant and      chemotherapy.   PLAN:  1. A 2-D echocardiogram to assess LV function, any degree of LVH, and      diastolic noncompliance, and also right-sided structures and      function, and question of pulmonary hypertension.  2. The patient encouraged to take his torsemide and potassium every      day.   I will plan on seeing him back in about 4-6 weeks.     Thomas C. Daleen Squibb, MD, [facility]  Electronically Signed    TCW/MedQ  DD: 11/01/2008  DT: 11/01/2008  Job #: [id number]   cc:   Patrica Duel, M.D.

## 2011-01-09 NOTE — H&P (Signed)
[name], Frank NO.:  [id number]   MEDICAL RECORD NO.:  [id number]          PATIENT TYPE:  INP   LOCATION:  3005                         FACILITY:  [facility]   PHYSICIAN:  Stefani Dama, M.D.  DATE OF BIRTH:  1951-02-10   DATE OF ADMISSION:  02/02/2005  DATE OF DISCHARGE:                                HISTORY & PHYSICAL   ADMISSION DIAGNOSIS:  Thoracic vertabrae-6 metastatic spinal cancer with  paraparesis.   HISTORY OF PRESENT ILLNESS:  Mr. [name] is a 60 year old right-handed  individual who tells me that he had a chest mass diagnosed several weeks ago  secondary to an abnormal chest x-ray. It apparently showed some hilar  adenopathy. He was having some diffuse back pain at the time which prompted  the x-ray study. The patient was to have further evaluation and to be  referred to Dr. Ines Bloomer; however, in the interim, his back pain  worsened and the patient developed significant weakness in his legs the  other day and had difficulty walking. Denies any problem with bowel or  bladder control.   He was seen at the [facility] Emergency Room today. MRI was  performed which demonstrated presence of pathologic fracture of the T6  vertebrae with cancerous growth and compression of the spinal cord. He was  transferred here for further definitive care.   PAST MEDICAL HISTORY:  Reveals that the patient has generally had good  health. He does have some hypercholesterolemia and had some hypertension. He  is followed by Dr. Patrica Duel for his general medical problems.   ALLERGIES:  He has an allergy to PENICILLIN which gives him hives.   SOCIAL HISTORY:  The patient does not smoke. He does not drink any alcohol.  He had some modest weight gain. Currently states his weight is at 280 pounds  and his height is 6 foot.   REVIEW OF SYMPTOMS:  Notable that he had some hypertension. He has a history  of some asthma and he has some lower  extremity weakness. Otherwise, he  denies any significant pain, though some problems with his skin and eats a  regular diet.   FAMILY HISTORY:  Negative for any substantial medical problems in the  family.   PHYSICAL EXAMINATION:  GENERAL:  Awake, alert, and oriented. He is alert and  cooperative individual in no overt distress.  VITAL SIGNS:  Blood pressure on today's examination is 164/96, heart rate 72  and regular, respirations are 20, temperature is 97.8 with a saturation of  95 on room air.  NEUROLOGICAL:  He stands with some difficulty but he walks with a severely  wide based gait and is markedly unstable on his gait. His motor strength is  good and iliopsoas quad, tibialis, anterior gastrocnemius. His deep tendon  reflexes are 2+ in the patellae, 1+ in the Achilles. Babinski's are upgoing  bilaterally. Sensation appears intact in the lower extremities. Intact in  the trunk and about the saddle. Motor strength in the upper extremities is  intact in the deltoids, biceps, triceps, grips, and  intrinsics. His reflexes  in the upper extremities reveal his biceps are 2+, triceps are 2+, brachial  radialis is 1+.  The patient has evidence of lesion in T6.  NECK:  Palpation of the neck reveals no masses. No bruits are heard.  LUNGS:  Clear to auscultation.  HEART:  Regular rate and rhythm. No murmurs are heard. There was apparently  some evidence of hilar adenopathy.   IMPRESSION:  At this point, he has a pathologic fracture of thoracic  vertabrae-6 with spinal cord compression. He has some mild signs of  myelopathy with a wide-based gait and some generalized weakness in the lower  extremities. He will require a surgical decompression via thoracotomy and  this will be planned with Dr. Ines Bloomer in the next day or so. He has  been started on some high-dose steroids in the meantime. We will admit the  patient to my service.       HJE/MEDQ  D:  02/03/2005  T:  02/03/2005   Job:  [id number]

## 2011-01-09 NOTE — Letter (Signed)
June 07, 2006    Ladona Horns. Neijstrom, MD  [address]  [place], [place] [address]   RE:  [name], [name]  MRN:  [id number]  /  DOB:  [birth year]   Dear [name]:   It was my pleasure to assess [name] in the office today at your request.  As you know, this gentleman was seen by me approximately one year ago with  chest discomfort.  A stress nuclear study was negative with normal left  ventricular systolic function.  He was subsequently diagnosed with lymphoma  and underwent chemotherapy, radiation therapy, and then autologous bone  marrow transplantation.  Approximately two months ago, he was admitted to  [facility] for what sounds like Pneumocystis pneumonia.  A chest x-ray  last month sounds as if it revealed clearing of that process.  He now  describes episodic fatigue, generally with exertion.  This has a variable  threshold, and some days he can do a lot more exercise than on other days.  Otherwise, he has been generally healthy.  He does have a history of mild  hypertension and dyslipidemia that has been well treated medically.  He has  recently been anemic with a hemoglobin of approximately 10.   Current medications include atorvastatin 10 mg daily, Prevacid 30 mg daily,  Bactrim b.i.d.   Records from [place] and from your office were obtained and reviewed.   PHYSICAL EXAMINATION:  GENERAL:  A pleasant, healthy-appearing gentleman in  no acute distress.  VITAL SIGNS:  Weight is 241, 48 pounds less than in 1997.  Blood pressure  110/70, heart rate 60 and irregular, respirations 16.  NECK:  No jugular venous distention; normal carotid upstrokes without  bruits.  LUNGS:  Clear.  CARDIAC:  Normal first and second heart sounds; minimal systolic murmur.  ABDOMEN:  Soft and nontender; liver edge at the right costal margin; normal  bowel sounds without bruits.  EXTREMITIES:  Minimal edema; distal pulses intact.   IMPRESSION:  [name] has been through a great deal over the  past year.  It  is not surprising that it is physically deconditioned and experiencing  exertional symptoms related to that process and to anemia.  Although I do  not know the exact nature of his chemotherapeutic regimens, he has probably  had some cardiotoxic agents.  An echocardiogram will be obtained.  I do not  see a recent TSH; that study will be requested as well.  If results are good  on those two tests, I think it would be safe for Mr. Mcneish to begin regular  exercise.  I will reassess this nice gentleman once those studies have been  completed.    Sincerely,     Gerrit Friends. Dietrich Pates, MD, [facility]    RMR/MedQ  /  Job #:  [id number]  DD:  06/07/2006 / DT:  06/08/2006   CC:    Dr Phillips Odor

## 2011-01-09 NOTE — Procedures (Signed)
NAME:  [name], [name] NO.:  [id number]   MEDICAL RECORD NO.:  [id number]          PATIENT TYPE:  REC   LOCATION:  RAD                           FACILITY:  [facility]   PHYSICIAN:  Vida Roller, M.D.   DATE OF BIRTH:  [birth year]   DATE OF PROCEDURE:  DATE OF DISCHARGE:                                    STRESS TEST   BRIEF HISTORY:  [name] is a 60 year old gentleman with no known coronary  disease with atypical chest discomfort and abnormal EKG and multiple cardiac  risk factors including hypertension, hyperlipidemia and tobacco use.   BASELINE DATA:  Electrocardiogram reveals a sinus rhythm at 70 beats per  minute with inferolateral ST abnormalities and poor R-wave progression.   The patient exercised for a total of 6 minutes and 38 seconds, Bruce  protocol stage 3 and 7.0 METS. Maximal heart rate achieved was 156 beats per  minute which is 93% of predicted maximum. Maximum blood pressure was 212/80  resolved down to 142/72 in recovery. Electrocardiogram revealed few PVCs.  Baseline abnormal EKG with inferolateral ST depressions, pseudo  normalization during exercise.   The patient experienced mild shortness of breath at the  end of exercise. He  reported after he had stopped exercising that he has chest tightness before  he had started, and this resolved during exercise. Exercise was stopped  secondary to fatigue.   Final images and results are pending M.D. review.      AB/MEDQ  D:  01/20/2005  T:  01/20/2005  Job:  [id number]

## 2011-01-09 NOTE — Op Note (Signed)
NAMEJOVANE, FOUTZ NO.:  [id number]   MEDICAL RECORD NO.:  [id number]          PATIENT TYPE:  AMB   LOCATION:  SDS                          FACILITY:  [facility]   PHYSICIAN:  Ines Bloomer, M.D. DATE OF BIRTH:  1951/06/04   DATE OF PROCEDURE:  09/07/2006  DATE OF DISCHARGE:  09/07/2006                               OPERATIVE REPORT   PREOPERATIVE DIAGNOSIS:  Malfunctioning left subclavian PowerPort Port-A-  Cath   POSTOPERATIVE DIAGNOSIS:  Malfunctioning left subclavian PowerPort Port-  A-Cath.   OPERATION PERFORMED:  Removal of left subclavian PowerPort Port-A-Cath,  insertion of right internal jugular PowerPort Port-A-Cath.   SURGEON:  Ines Bloomer, M.D.   ANESTHESIA:  IV sedation, 1% Xylocaine.   After adequate IV sedation, the left subclavian Port-A-Cath was  infiltrated with 1% Xylocaine at the incision and dissection was carried  down dissecting out the Port-A-Cath.  The Port-A-Cath was then injected  and when it was injected the fluid extravasated along the track.  We  thought this was first a hole in the tubing, but could not find a hole  at the area where the extravasation was, so then we infiltrated the area  at the entry site in the left subclavian area and the previous incision  was open and the tubing was dissected out and a left subclavian puncture  was then performed, and we attempted to thread a guide wire to the right  atrium, but would not pass the left innominate vein.  We suspected that  there was scarring secondary to the Port-A-Cath that was obstructing the  Port-A-Cath.  For this reason, we decided to put in a new Port-A-Cath  and infiltrated the right neck with 1% Xylocaine and did a right IJ  puncture and under fluoro guidance, passed the guide wire to the right  atrium.  A stab wound was made around the guide wire, another area was  infiltrated with 1% Xylocaine in the anterior chest and a pocket was  dissected out with  scissors and electrocautery.  Then we tunneled the  Port-A-Cath tubing from the pocket up to the stab wound over the guide  wire, passed the dilator with a peel-away sheath.  The dilator and the  guide wire were removed and the tubing was passed through the peel-away  sheath down to the superior portion of the right atrium.  We put it in  the right atrium just to be sure there was free flow and there was not  any more obstruction from scar tissue.  We then cut the tubing  appropriately and placed it on the new Port-A-Cath reservoir and put the  locking nut in place and placed the reservoir into the pocket and  sutured in place with 2-0 silk.  It flushed easy and withdrew easy.  Then we closed all the wounds with 3-0 Vicryl and the subcutaneous  tissue and Dermabond for the skin.  The patient was returned to the  recovery room in stable condition.           ______________________________  Ines Bloomer, M.D.  DPB/MEDQ  D:  09/07/2006  T:  09/07/2006  Job:  [id number]

## 2011-01-09 NOTE — Op Note (Signed)
[name], BARBIAN NO.:  [id number]   MEDICAL RECORD NO.:  [id number]          PATIENT TYPE:  AMB   LOCATION:  DFTL                         FACILITY:  [facility]   PHYSICIAN:  Ines Bloomer, M.D. DATE OF BIRTH:  [birth year]   DATE OF PROCEDURE:  DATE OF DISCHARGE:  07/29/2006                               OPERATIVE REPORT   PREOPERATIVE DIAGNOSIS:  Malfunctioning left subclavian Port-A-Cath.   POSTOPERATIVE DIAGNOSIS:  Malfunctioning left subclavian Port-A-Cath.   OPERATION PERFORMED:  Revision of left subclavian Port-A-Cath with  insertion of PowerPort Port-A-Cath.  After prep and drape in the left  chest and IV sedation, the area was infiltrated with 1% Xylocaine at the  previous Port-A-Cath incision and also at the puncture at the  subclavian.  The previous incision was opened, and dissection was  carried down, dissecting out the PowerPort.  We could infuse though  this, but it would not draw blood.  The tubing was divided and clamped  with a __________, and then the Port-A-Cath reservoir was dissected out  and enlarged to accommodate a PowerPort reservoir.  Then a guidewire was  passed through the tubing, and the tubing was removed.  Then a stab  wound was made over the guidewire at the infraclavicular just below the  clavicle, and the guidewire was dissected out.  Over the guidewire was  passed a PowerPort tubing, and it was confirmed to be in the right  atrial SVC junction by fluoro.  The tubing was then retrograde tunneled  using a tenotomy tunneler to the reservoir site.  It was connected to  the PowerPort and locked in place, and the PowerPort was placed in the  pocket and sutured in place with 3-0 silk.  It flushed easy and withdrew  easily.  Wounds were closed with 3-0 Vicryl and Dermabond for the skin.  The patient was then returned to the recovery room in stable condition.           ______________________________  Ines Bloomer, M.D.     DPB/MEDQ  D:  07/29/2006  T:  07/30/2006  Job:  [id number]   cc:   Ines Bloomer, M.D.

## 2011-01-09 NOTE — Op Note (Signed)
[name], [name] NO.:  [id number]   MEDICAL RECORD NO.:  [id number]          PATIENT TYPE:  AMB   LOCATION:  DAY                          FACILITY:  [facility]   PHYSICIAN:  Charlynne Pander, D.D.S.DATE OF BIRTH:  1950/10/16   DATE OF PROCEDURE:  05/07/2005  DATE OF DISCHARGE:                                 OPERATIVE REPORT   PREOPERATIVE DIAGNOSES:  1.  Lymphoma.  2.  Active chemotherapy.  3.  Chronic apical periodontitis.  4.  Chronic periodontitis. Marland Kitchen  5.  Accretions .  6.  Multiple retained roots   POSTOPERATIVE DIAGNOSES:  1.  Lymphoma.  2.  Active chemotherapy.  3.  Chronic apical periodontitis.  4.  Chronic periodontitis. Marland Kitchen  5.  Accretions .  6.  Multiple retained roots   OPERATIONS:  1.  Extraction of tooth numbers 1, 2, 16, 17, 19, 20, 27, 28, 30, 31 and 32      .  2.  Three quadrants of alveoloplasty .  3.  Gross debridement of the remaining dentition.   SURGEON:  Charlynne Pander, D.D.S.   ASSISTANT:  Elliot Dally (Sales executive).   ANESTHESIA:  General anesthesia via nasoendotracheal tube with Dr. Eilene Ghazi as the attending.   MEDICATIONS:  1.  Clindamycin 600 mg prior to invasive dental procedures .  2.  Local anesthesia with a total utilization of 4 carpules each containing      36 mg of Xylocaine with 0.018 mg of epinephrine as well as 2 carpules      containing 9 mg of bupivacaine with 0.009 mg of epinephrine.   SPECIMENS:  There were 11 teeth which were discarded.   CULTURES:  None.   DRAINS:  None.   COMPLICATIONS:  None.   ESTIMATED BLOOD LOSS:  100 mL.   FLUIDS:  2100 mL of lactated Ringer solution .   INDICATIONS:  The patient was recently diagnosed with lymphoma and was  undergoing active chemotherapy.  A dental consultation was requested to rule  out dental infection which would affect the patient's systemic health while  on active chemotherapy.  A treatment plan was formulated which would allow  several cycles of chemotherapy to take place with the multiple dental  extractions with alveoloplasty and gross debridement of the remaining  dentition to proceed in the operating room at that time.  This treatment  plan was formulated to decrease the risks and complications associated with  dental infection from affecting the patient's systemic health while on the  active chemotherapy.   OPERATIVE FINDINGS:  The patient was examined in operating room #6.  The  teeth were identified for extraction.  The patient was noted to be affected  by chronic apical periodontitis, chronic periodontitis, accretions, multiple  dental caries and multiple retained root segments.  The aforementioned  necessitated the removal of tooth numbers 1, 2, 16, 17, 19, 20, 27, 28, 30,  31, and 32 with alveoloplasty along with a grossly debridement of the  remaining dentition.   DESCRIPTION OF PROCEDURE:  The patient was brought to the main  operating  room #6.  The patient was then placed in supine position on the operating  room table.  General anesthesia was induced per the anesthesia team.  A  throat pack was placed at this time.  The patient was then prepped and  draped in the usual manner for a dental medicine procedure.  The oral cavity  was thoroughly examined with the findings as noted above.  The patient was  then ready for the dental medicine procedure as follows:   Local anesthesia was administered sequentially over the 2-hour long  procedure with a total utilization of 4 carpules each containing 36 mg of  Xylocaine with 0.018 mg of epinephrine as well as 2 carpules each containing  9 mg of bupivacaine with 0.009 mg of epinephrine.   The maxillary quadrants were first approached.  Anesthesia was delivered as  previously described.  A 15 blade incision was made from the distal of #1  and extended to the mesial of #3.  A surgical flap was then carefully  reflected.  Tooth numbers 1 and 2 were subluxated  with a series of straight  elevators.  Tooth number 2 was then removed with a 53-R forceps without  complications.  Appropriate amounts of buccal and interseptal bone were  removed around the remaining root #1.  This was then elevated out with a  large straight elevator.  Alveoplasty was then performed utilizing a  rongeurs and bone file.  The tissues were approximated and trimmed  appropriately.  The surgical site was then irrigated with copious amounts of  sterile saline.  Surgical site was then closed from the distal of the  tuberosity and extended to the distal of tooth #3 .   The maxillary left quadrant was then approached.  Tooth #16 was then  subluxated with a series of straight elevators and removed with a 150  forceps without complications.  The socket was curetted and compressed  appropriately.  The surgical site was then closed with a figure-of-eight  suture technique and 3-0 chromic gut suture material.   The mandibular quadrants were then approached.  The patient was given  bilateral inferior alveolar nerve blocks with the bupivacaine with  epinephrine.  Further infiltration was achieved with Xylocaine with  epinephrine.  At this time, the mandibular left quadrant was approached.  A  15 blade incision was made from the distal of #17 and extended to the mesial  of #21.  A surgical flap was then carefully reflected.  Tooth #17 was then  removed with a 23 forceps without complications.  The coronal segment of  tooth #20 was then removed at this time.  This left remaining roots in the  area of tooth numbers 19 and 20.  A surgical handpiece and bur and copious  amounts of sterile saline were utilized to remove bone around these  remaining root segments appropriately.  These roots were then elevated and  removed with a 151 forceps without complications.  Alveoplasty was then performed utilizing rongeurs and bone file.  Surgical site was then  irrigated with copious amounts of  sterile saline.  The tissues were  approximated and trimmed appropriately.  The surgical site was then closed  from the distal of #17 in a figure-of-eight suture technique in this area.  Chromic 3-0 gut suture material was then utilized from the mesial of #18 and  extended to the distal of #21, utilizing 3-0 chromic gut suture in a  continuous interrupted suture technique x 1.  At this point in time, the mandibular right quadrant was approached.  A 15  blade incision was made from the distal of #32 and extended to the distal of  #26.  Surgical flap was then carefully reflected.  An appropraite amount of  bone was removed around root #27.  The remaining roots were then elevated  and removed with a 151 forceps without complications.  Alveoloplasty was  then performed utilizing a rongeurs and bone file.  The surgical site was  then irrigated with copious amounts sterile saline.  Please note that tooth  numbers 27, 28, 30, 31, and 32 were removed at this time.  The tissues were  then approximated and trimmed appropriately.  The surgical site was then  closed from the distal of #32 and extended to the distal of #26, utilizing 3-  0 chromic gut suture in a continuous interrupted suture technique x 1.   At this point in time, a [employer] sonic scaler was then utilized to remove  accretions around the remaining teeth.  A series of hand curettes were then  utilized to further remove accretions.  The [employer] sonic scaler was then  again utilized to refine the removal of the accretions.  At this point in  time, the entire mouth was irrigated with copious amounts of sterile saline.  The patient was examined for complications, seeing none, the dental medicine  procedure was deemed to be complete.  The throat pack was removed at this  time.  A series of 4x4 gauzes were placed in the mouth along with an oral  airway at the request of the anesthesia team.  The patient was then handed  over to the  anesthesia team for final disposition.  After an appropriate  amount of time, the patient was extubated and taken to the post anesthesia  care unit with stable vital signs and good oxygenation level.  All counts  were correct for the dental medicine procedure.  The patient will be given  appropriate pain medication and will follow up with dental medicine in  approximately 1 week for evaluation for suture removal.      Charlynne Pander, D.D.S.  Electronically Signed     RFK/MEDQ  D:  05/07/2005  T:  05/07/2005  Job:  [id number]   cc:   Ladona Horns. Neijstrom, MD  [address]  [place]  [place] [address]  Fax: [phone]   Artist Pais. Kathrynn Running, M.D.  [address] [facility]  [place]  [place] [address]  Fax: [phone]

## 2011-01-09 NOTE — Op Note (Signed)
NAMEKIPPER, BUCH NO.:  [id number]   MEDICAL RECORD NO.:  [id number]          PATIENT TYPE:  INP   LOCATION:  3114                         FACILITY:  [facility]   PHYSICIAN:  Stefani Dama, M.D.  DATE OF BIRTH:  06-23-1951   DATE OF PROCEDURE:  02/03/2005  DATE OF DISCHARGE:                                 OPERATIVE REPORT   PREOPERATIVE DIAGNOSIS:  T6 compression fracture with paraparesis secondary  to metastatic cancer.   POSTOPERATIVE DIAGNOSIS:  T6 compression fracture with paraparesis secondary  to metastatic cancer.   OPERATION:  Right T5 thoracotomy, open biopsy of mediastinal lymph nodes,  corpectomy of T6 with reconstruction with titanium cage and Alphatec plate.   SURGEONS:  1.  Dr. Barnett Abu and Dr. Maeola Harman for the decompression and      stabilization.  2.  The approach and closure, and mediastinal biopsies done by Dr. Karle Plumber.   INDICATIONS:  [name] is a 60 year old individual who has had  significant problems with paraparesis.  He was noted to have a chest mass a  few weeks ago and was supposed to have further workup for this process;  however, he started developing weakness in his legs with difficulty walking.  He was advised regarding surgical decompression when it was noted on MRI  that he had collapse of T6 vertebrae with involvement of cancer.   PROCEDURE:  The patient was brought to the operating room supine on a  stretcher.  After the smooth induction of general endotracheal anesthesia,  he was placed in the left lateral decubitus position with the arm being  outstretched in f front of him on the right side to allow for a thoracotomy  of the T5 vertebra.  The patient was prepped and draped sterilely and the  procedure was started by Dr. Karle Plumber, who performed the thoracotomy  to approach the right side.  The 5th root rib was removed.  The dissection  was then continued by me once Dr. Edwyna Shell had  biopsied the lymph node and had  opened the parietal pleura along the rib cage overlying the mass at the T6  vertebra.  Once this was isolated, then the mass was entered.  Some  prespinal fat was removed and resected.  This fat was extremely edematous.  The vertebra was then noted be significantly collapsed and the bone was  quite loose and several biopsies of loose bony material were obtained.  As  the dissection continued, portions of vertebra were removed en bloc and  ultimately the endplates at the T5-6 and at T6-T7 vertebrae were identified.  Once these were identified, the endplates were cleared and the borders of  the resection cavity were identified positively.  The resection then  continued posteriorly and to the left side so as to expose the common dural  tube; this was done in a very careful piecemeal fashion, removing some small  portions of bone that were distinctly attached to the posterior longitudinal  ligament.  The ligament itself was removed and ultimately, significant  epidural fat that was markedly edematous was encountered; biopsies of this  were obtained to see if there was any tumor within this area.  This area was  decompressed so that the dura was completely exposed in the thoracic spinal  canal across the level of the T6 vertebra.  No gross tumor could be  identified; however, the edematous fat was very suspicious for a cancerous  growth.  In any event, once it was decompressed, the endplates were prepared  and then Synthes titanium cage construct was used with the medium-sized  titanium spacers being placed into the interspace and being tamped into the  center of the vertebra.  The cage measured 30 mm with the endplates attached  to it.  This was trial-fit with the interspace being distracted slightly.   The distraction was accomplished by placing the vertebral screws into the  bodies of T5 and T7, using an awl as a guide to place the holes for the  vertebra;  30-mm long, 6.5-mm-diameter screws were placed in T5, 35-mm 6.5-mm-  diameter screws were placed in T7, once these holes were tapped.  The  distraction tool was then placed on top of the screw heads and this allowed  for some modest distraction so that the cage could be started and impacted  into the interspace of T6.  Once this was accomplished, the distraction  instrument was removed and then a standard 53-mm plate was placed over the  long screws in the vertebrae; these slid into position easily and then a  small awl was used to create the holes for the small locking screws and  these were 25- mm screws that were used for this purpose; they were inserted  without difficulty.  Ultimately, the system was torqued down into its final  locking configuration.  An attempt at a cross-table lateral x-ray  demonstrated good position of hardware, so great detail was difficult to  tell because of the patient's large size and overall modest quality of the x-  ray.  In any event, the T6 vertebra was well-decompressed.  Hemostasis  around the surgical bed was obtained adequately and then the procedure was  turned over to Dr. Dewayne Shorter for final closure of the thoracotomy.       HJE/MEDQ  D:  02/03/2005  T:  02/04/2005  Job:  [id number]

## 2011-01-09 NOTE — Op Note (Signed)
NAMEJOURDON, [name] NO.:  [id number]   MEDICAL RECORD NO.:  [id number]          PATIENT TYPE:  INP   LOCATION:  3114                         FACILITY:  [facility]   PHYSICIAN:  Ines Bloomer, M.D. DATE OF BIRTH:  September 26, 1950   DATE OF PROCEDURE:  02/03/2005  DATE OF DISCHARGE:                                 OPERATIVE REPORT   PREOPERATIVE DIAGNOSIS:  Marked mediastinal adenopathy, T6 vertebral body  destruction, with cord compression.   POSTOPERATIVE DIAGNOSES:  1.  Marked mediastinal adenopathy, T6 vertebral body destruction, with cord      compression.  2.  Possible lymphoma.   OPERATION PERFORMED:  Right exploratory thoracotomy, resection of  mediastinal nodes, exposure of T6 vertebral body.   SURGEON:  Ines Bloomer, M.D.   FIRST ASSISTANT:  Stefani Dama, M.D.   ANESTHESIA:  General anesthesia.   After percutaneous insertion of all monitoring lines, the patient under  general anesthesia was turned to the right lateral thoracotomy position and  was prepped and draped in the usual sterile manner.  Two trocar sites were  made in the anterior and posterior axillary line at the seventh intercostal  space.  Then, a posterolateral thoracotomy was made over the fifth  intercostal space.  The latissimus was divided.  The serratus was reflected  anteriorly.  The fifth intercostal space was entered, and a portion of the  sixth rib was taken subperiosteally at the angle.  A fenestrated retractor  was inserted.  The lung was deflated with the dual-lumen tube.  The patient  had marked mediastinal adenopathy, and just above the azygous vein there was  a 4-5 cm node that was readily visible.  The pleura was opened with  electrocautery over this, and dissection was carried down to the base of the  node, and this was divided with electrocautery.  All bleeding was  coagulated.  The node was then sent for permanent section.  After this had  been done, attention was  turned to the T5, T6, and T7 vertebral bodies, and  the pleural was dissected off these bodies. There were marked abnormalities  of the T6 body where it was destructed, and after the pleura had been  dissected off, biopsies were taken of the T6 vertebral body and sent for  permanent section. Then, Dr. Danielle Dess did a corpectomy and reconstruction with  a titanium cage and plates.  After this had been stabilized and confirmed by  x-  ray, two chest tubes were brought into the trocar sites, tied in place with  0 silk.  The chest was closed with six pericostals, #1 Vicryl in the muscle  layer, 2-0 Vicryl in the subcutaneous tissue, and Ethicon skin clips.  The  patient was returned to the recovery room in stable condition.       DPB/MEDQ  D:  02/05/2005  T:  02/05/2005  Job:  [id number]   cc:   Ines Bloomer, M.D.  [address]  [place]  [place] [address]

## 2011-01-09 NOTE — Discharge Summary (Signed)
NAMEJAYMZ, [name] NO.:  [id number]   MEDICAL RECORD NO.:  [id number]          PATIENT TYPE:  INP   LOCATION:  3040                         FACILITY:  [facility]   PHYSICIAN:  Stefani Dama, M.D.  DATE OF BIRTH:  [birth year]   DATE OF ADMISSION:  02/02/2005  DATE OF DISCHARGE:  02/10/2005                                 DISCHARGE SUMMARY   ADMISSION DIAGNOSIS:  T6 compression fracture with paraparesis and  mediastinal mass, new diagnosis.   DISCHARGE/FINAL DIAGNOSIS:  Mediastinal mass with T6 compression fracture  secondary to metastatic B-cell type lymphoma, large cell.   OPERATIONS:  Status post thoracotomy at T5 with biopsy of mediastinal mass  and resection of T6 vertebra, with decompression stabilization from T5 to  T7.   CONDITION ON DISCHARGE:  Improving.   HOSPITAL COURSE:  Mr. [name] is a 60 year old individual who had been in  fairly good state of health until being diagnosed with a chest mass, found  to be widening his mediastinum. He was doing well until he developed  significant back pain and leg weakness and was found to have compression  fracture of T6, secondary to pathologic fracture there. He was transferred  from [facility] emergently to [facility] and further  workup demonstrated that there was cord compression at the T6 level and he  was advised regarding surgical decompression via thoracotomy. This was  undertaken on February 03, 2005 with the thoracotomy at the T5 level being  performed by Dr. Edwyna [name] and myself performing the corpectomy and  decompression of the T6 vertebral body. Biopsy specimens revealed a large  cell, B-cell type lymphoma and the patient was maintained in the Intensive  Care Unit for the first 48 hours postoperatively. Chest tubes were gradually  removed and he was then mobilized after removal of the chest tubes to the  floor and gradually started to proceed with ambulation. Initially, it was  felt  that he might need some inpatient rehabilitation because of his modest  weakness that he had in his legs but this rapidly improved after surgery and  at the time of discharge, the patient has some occasional spasms in his legs  and he has been medicated with Robaxin. Additionally, he had some modest  pain in the chest, being treated with Darvocet. He will be seen in the  office in about 3 to 4 weeks time. The incisions are clean and dry. Staples  have been removed. He will be followed up with radiation oncology and has  been seen in the hospital by Dr. Jodi [name]. He has also been seen by Dr.  Mancel [name] and followup will likely be in the [place] area with Dr.  Mariel [name].       HJE/MEDQ  D:  02/10/2005  T:  02/11/2005  Job:  [id number]

## 2011-01-09 NOTE — Letter (Signed)
July 07, 2006     RE:  Frank, [name]  MRN:  [id number]  /  DOB:  31-Jan-1951   Judithann Sheen, MD  [address], [address]  [place], [place] [address]   Dear Vonna Kotyk,   [name] returns to the office for continued assessment and treatment of  fatigue.  When I last saw him, he had just completed chemotherapy for  lymphoma.  We checked with Dr. Thornton Papas office, who reported that his  only therapeutic agent was Rituxan.  This is not apparently associated with  cardiac toxicity.   [name] reports a marked improvement in his sense of well-being.  He  remains relatively sedentary, but fatigue and malaise have virtually  resolved.  A CBC has not been reassessed to follow up his anemia.   CURRENT MEDICATIONS INCLUDE:  1. Atorvastatin 10 mg daily.  2. Prevacid 30 mg daily.  3. Bactrim bi-weekly.   PHYSICAL EXAMINATION:  GENERAL:  Burly, pleasant gentleman, in no acute  distress.  VITAL SIGNS:  The weight is 246, five pounds more than last month.  Blood  pressure 130/70, heart rate 64 and regular, respirations 16.  NECK:  No jugular venous distention.  LUNGS:  Clear.  CARDIAC:  Scratchy early systolic ejection murmur.  ABDOMEN:  Soft and nontender; no organomegaly.  EXTREMITIES:  1+ edema.   IMPRESSION:  [name] is doing well.  I reassured him that his edema is  probably insignificant.  He will use salt restriction and leg elevation to  treat this minor problem.  He will attempt to gradually increase activity.  Influenza vaccine was administered, at his request.  Please let me know at  any time that I can offer further assistance in the care of this nice  gentleman.    Sincerely,      Gerrit Friends. Dietrich Pates, MD, [facility]  Electronically Signed    RMR/MedQ  DD: 07/07/2006  DT: 07/07/2006  Job #: [id number]   CC:    Ladona Horns. Mariel Sleet, MD

## 2011-01-09 NOTE — Procedures (Signed)
NAMELAYTON, NAVES NO.:  [id number]   MEDICAL RECORD NO.:  [id number]          PATIENT TYPE:  OUT   LOCATION:  RAD                           FACILITY:  [facility]   PHYSICIAN:  Thomas C. Wall, MD, FACCDATE OF BIRTH:  1951/02/25   DATE OF PROCEDURE:  06/09/2006  DATE OF DISCHARGE:                                  ECHOCARDIOGRAM   INDICATIONS:  Nonspecified cardiovascular disease, 429.2, and hypertension,  401.1.   The echocardiogram was technically adequate.   CONCLUSION:  1. Normal left and right atrial size.  2. Mild left ventricular hypertrophy with disproportionate septal wall      thickness.  There is no obvious outflow tract obstruction.  There is      mild global hypokinesia, EF around 45-50%.  Left ventricular chamber      size is normal, however.  3. Normal valvular function.  4. Normal right-sided structures and function.  5. No pericardial effusion.      Thomas C. Daleen Squibb, MD, [facility]  Electronically Signed     TCW/MEDQ  D:  06/09/2006  T:  06/10/2006  Job:  [id number]   cc:   Patrica Duel, M.D.  Fax: [phone]   Gerrit Friends. Dietrich Pates, MD, [facility]  [address]  [place], [place] [address]

## 2011-01-09 NOTE — Consult Note (Signed)
NAME:  [name], [name] NO.:  [id number]   MEDICAL RECORD NO.:  [id number]         PATIENT TYPE:  AMB   LOCATION:                                 FACILITY:   PHYSICIAN:  [name], M.D. DATE OF BIRTH:  1950/11/29   DATE OF CONSULTATION:  10/20/2004  DATE OF DISCHARGE:                                   CONSULTATION   REFERRING PHYSICIAN:  Patrica [name], M.D.   REASON FOR CONSULTATION:  Colonoscopy.   HISTORY OF PRESENT ILLNESS:  Mr. [name] is a 60 year old African-American  male patient of Dr. Nobie [name].  He has history of colonic polyps.  Colonoscopy from June 13, 1993, revealed inflamed hyperplastic rectal  polyps.  He also had vascular ectasias in the transverse right colon of  uncertain significance and otherwise normal exam.  Over the past 11 years,  he has done quite well.  He denies any abdominal pain, rectal bleeding, or  melena.  Bowel movements have been normal at 1 to 2 per day, soft and brown.  He denies nausea, vomiting, heartburn, indigestion, dysphagia, or  odynophagia.   PAST MEDICAL HISTORY:  1.  Hyperlipidemia.  2.  Hypertension.  3.  Intermittent left lower extremity edema.  4.  Colonoscopy as described in HPI.   PAST SURGICAL HISTORY:  Denies.   CURRENT MEDICATIONS:  1.  Lipitor 10 mg daily.  2.  Benicar 1 tablet daily.   ALLERGIES:  PENICILLIN.   FAMILY HISTORY:  No known family history of colorectal carcinoma or liver  chronic GI problems. Mother, age 73, is alive with history of lung  carcinoma.  Father, age 18, has history of carcinoma of unknown etiology.  He has one healthy sister and one health brother.   SOCIAL HISTORY:  Mr. [name] has been married 21 years.  He has two children,  ages 74 and 65.  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  He reports occasional cigar smoking about every other day and  remote cigarette smoking history of about 10 years.  He denies alcohol or  drug use.   REVIEW  OF SYSTEMS:  CONSTITUTIONAL:  Weight is stable.  Appetite is not as  much as it used to be; however, his weight continues to be stable.  Denies  any fever or chills.  CARDIOVASCULAR:  Denies any chest pain or  palpitations.  PULMONARY:  Denies any discharge, cough, hemoptysis. GI:  See  HPI.   PHYSICAL EXAMINATION:  VITAL SIGNS:  Weight 271.5 pounds, temperature 98.1,  blood pressure 140/90, pulse 80.  GENERAL:  Mr. [name] is a 60 year old African-American male who is alert,  pleasant, cooperative, in no acute distress.  HEENT:  Sclerae clear.  Not icteric.  Oropharynx pink and moist without  lesions.  NECK:  Supple without mass or thyromegaly.  HEART:  Regular rate and rhythm.  Normal S1 and S2 without murmurs, clicks,  rubs, or gallops.  LUNGS:  Clear to auscultation bilaterally.  ABDOMEN:  Protuberant, positive bowel sounds x 4.  No bruits auscultated.  Soft, nontender, nondistended without palpable  mas or hepatosplenomegaly.  No rebound tenderness or guarding.  EXTREMITIES:  Trace bilateral lower extremity edema.  SKIN: Warm and dry without rash or jaundice.   IMPRESSION:  Mr.  [name] is a 60 year old African-American male who is due  for a screening colonoscopy.  He has history of rectal hyperplastic polyps  from last colonoscopy in October 1994.  Symptomatically, he is doing quite  well.   RECOMMENDATIONS:  We will scheduled screening colonoscopy with Dr. Jena [name] in  the near future.   Discussed the procedure including risks and benefits which included but are  not limited to bleeding, infection, perforation, and drug reaction.  He  agrees with the plan.  Consent will be obtained.   We would like to thank Dr. Nobie [name] for allowing [place] to participate in the  care of Mr. [name].      KC/MEDQ  D:  10/20/2004  T:  10/20/2004  Job:  [id number]   cc:   [name], M.D.  [address]  [place]  [place] [address]  Fax: [phone]

## 2011-01-09 NOTE — Op Note (Signed)
NAME:  [name], [name] NO.:  [id number]   MEDICAL RECORD NO.:  [id number]          PATIENT TYPE:  AMB   LOCATION:  DAY                           FACILITY:  [facility]   PHYSICIAN:  R. Roetta Sessions, M.D. DATE OF BIRTH:  1951/03/21   DATE OF PROCEDURE:  11/24/2004  DATE OF DISCHARGE:                                 OPERATIVE REPORT   PROCEDURE PERFORMED:  Colonoscopy with biopsy and snare polypectomy.   INDICATIONS FOR PROCEDURE:  The patient is a 60 year old gentleman referred  by Patrica Duel, M.D. for colorectal cancer screening.  He had a  colonoscopy back in 1994.  He had some hyperplastic polyps taken out, no  family history of colorectal neoplasia.  He has no bowel symptoms.  Colonoscopy is now being done.  This approach has been discussed with the  patient at length,  Potential risks, benefits and alternatives have been  reviewed, questions answered, he is agreeable.  Please see documentation in  the medical record.   DESCRIPTION OF PROCEDURE:  Oxygen saturations, blood pressure, pulse and  respirations were monitored throughout the entire procedure.  Conscious  sedation with Versed 5 mg IV, Demerol 75 mg IVl.   INSTRUMENT USED:  Olympus video chip system.   FINDINGS:  Digital rectal examination revealed no abnormalities.   ENDOSCOPIC FINDINGS:  Prep was adequate.   Rectum:  Examination of the rectal mucosa including retroflex view of the  anal verge revealed no abnormalities.  Colon:  Colonic mucosa surveyed from the rectosigmoid junction through the  left, transverse, right colon to the area of the appendiceal orifice,  ileocecal valve and cecum.  These structures were well seen and photographed  for the record.  From this level the scope was slowly withdrawn.  All  previously mentioned mucosal surfaces were again seen.  At 30 cm there was a  diminutive 4 mm polyp which was removed with cold biopsy forceps.  At 15 cm  there was a 7 mm somewhat  pedunculated polyp which was removed  with cold  snare technique.  There was also two 2 cm submucosal lesions consistent with  lipomas.  One in the ascending colon and one in the mid descending colon  which were photographed but not manipulated.  The patient tolerated the  procedure well and was reacted in endoscopy.   IMPRESSION:  1.  Normal rectum.  2.  Polyps in the left colon ablated/removed as described above.  Two      submucosal lesions consistent with lipomas as described above not      manipulated.   RECOMMENDATIONS:  1.  Follow-up on pathology.  2.  Further recommendations to follow.      RMR/MEDQ  D:  11/24/2004  T:  11/24/2004  Job:  [id number]   cc:   Patrica Duel, M.D.  [address]  [place]  [place] [address]  Fax: [phone]

## 2011-01-09 NOTE — Op Note (Signed)
[name], [name] NO.:  [id number]   MEDICAL RECORD NO.:  [id number]          PATIENT TYPE:  OIB   LOCATION:  2899                         FACILITY:  [facility]   PHYSICIAN:  Ines Bloomer, M.D. DATE OF BIRTH:  [birth year]   DATE OF PROCEDURE:  03/02/2005  DATE OF DISCHARGE:                                 OPERATIVE REPORT   PREOPERATIVE DIAGNOSIS:  Large cell lymphoma.   POSTOPERATIVE DIAGNOSIS:  Large cell lymphoma.   OPERATION PERFORMED:  Left subclavian port-A-Cath.   SURGEON:  Ines Bloomer, M.D.   ANESTHESIA:  Local anesthesia.   After local anesthesia with Cetacaine, Xylocaine, and IV sedation, the left  neck was prepped and draped in the usual sterile manner, and the left chest  was prepped and draped in the usual sterile manner.  Area was infiltrated  with 1% Xylocaine, and a left subclavian puncture was performed and a guide  wire threaded under fluoro to the right atrium.  A stab wound was made  around the guide wire, and another area was infiltrated with 1% Xylocaine  inferior to this, and a dissection was carried out, dissecting out a port-A-  Cath pocket.  The reservoir was then placed in the pocket.  The reservoir  was sutured and placed in the pocket with 3-0 silk, and then the tubing was  tunneled up to the stab wound around the guide wire.  Using fluoro guidance,  it was measured to reach to the right atrium/SVC junction and cut  appropriately.  Over the guide wire was passed a dilator with peelaway  sheath.  The dilator and the guide wire were removed, and through the  peelaway sheath was passed the tubing.  The peelaway sheath was removed.  This was confirmed to be in the right atrium.  Wounds were closed with 3-0  Vicryl in the subcutaneous tissue and subcuticular stitches, and Dermabond  for the skin.  It flushed well and withdrew easily.       DPB/MEDQ  D:  03/02/2005  T:  03/02/2005  Job:  [id number]   cc:   Ines Bloomer,  M.D.  [address]  [place]  [place] [address]

## 2011-02-12 ENCOUNTER — Encounter (HOSPITAL_COMMUNITY): Payer: Medicare Other | Attending: Oncology

## 2011-02-12 DIAGNOSIS — C8589 Other specified types of non-Hodgkin lymphoma, extranodal and solid organ sites: Secondary | ICD-10-CM

## 2011-02-12 DIAGNOSIS — Z9221 Personal history of antineoplastic chemotherapy: Secondary | ICD-10-CM | POA: Insufficient documentation

## 2011-02-12 DIAGNOSIS — Z9481 Bone marrow transplant status: Secondary | ICD-10-CM | POA: Insufficient documentation

## 2011-02-12 DIAGNOSIS — Z452 Encounter for adjustment and management of vascular access device: Secondary | ICD-10-CM

## 2011-03-26 ENCOUNTER — Encounter (HOSPITAL_COMMUNITY): Payer: Medicare Other | Attending: Oncology

## 2011-03-26 DIAGNOSIS — C8590 Non-Hodgkin lymphoma, unspecified, unspecified site: Secondary | ICD-10-CM

## 2011-03-26 DIAGNOSIS — Z452 Encounter for adjustment and management of vascular access device: Secondary | ICD-10-CM

## 2011-03-26 DIAGNOSIS — C8589 Other specified types of non-Hodgkin lymphoma, extranodal and solid organ sites: Secondary | ICD-10-CM | POA: Insufficient documentation

## 2011-03-26 LAB — DIFFERENTIAL
Basophils Absolute: 0 10*3/uL (ref 0.0–0.1)
Basophils Relative: 1 % (ref 0–1)
Eosinophils Absolute: 0.2 10*3/uL (ref 0.0–0.7)
Monocytes Absolute: 0.5 10*3/uL (ref 0.1–1.0)
Neutro Abs: 2.4 10*3/uL (ref 1.7–7.7)

## 2011-03-26 LAB — COMPREHENSIVE METABOLIC PANEL
AST: 15 U/L (ref 0–37)
Albumin: 3.4 g/dL — ABNORMAL LOW (ref 3.5–5.2)
Calcium: 9.2 mg/dL (ref 8.4–10.5)
Chloride: 105 mEq/L (ref 96–112)
Creatinine, Ser: 0.93 mg/dL (ref 0.50–1.35)
Total Bilirubin: 0.3 mg/dL (ref 0.3–1.2)
Total Protein: 6.7 g/dL (ref 6.0–8.3)

## 2011-03-26 LAB — CBC
HCT: 35.5 % — ABNORMAL LOW (ref 39.0–52.0)
MCHC: 33.8 g/dL (ref 30.0–36.0)
RDW: 15.7 % — ABNORMAL HIGH (ref 11.5–15.5)

## 2011-03-26 LAB — SEDIMENTATION RATE: Sed Rate: 15 mm/hr (ref 0–16)

## 2011-03-26 MED ORDER — HEPARIN SOD (PORK) LOCK FLUSH 100 UNIT/ML IV SOLN
INTRAVENOUS | Status: AC
  Filled 2011-03-26: qty 5

## 2011-03-26 NOTE — Progress Notes (Signed)
[name] presented for Portacath access and flush. Proper placement of portacath confirmed by CXR. Portacath located right chest wall accessed with  H 20 needle. Good blood return present.  Specimen drawn for labs. Portacath flushed with 20ml NS and 500U/24ml Heparin and needle removed intact. Procedure without incident. Patient tolerated procedure well.

## 2011-04-29 ENCOUNTER — Encounter (HOSPITAL_COMMUNITY): Payer: Medicare Other | Attending: Oncology | Admitting: Oncology

## 2011-04-29 ENCOUNTER — Encounter (HOSPITAL_COMMUNITY): Payer: Self-pay | Admitting: Oncology

## 2011-04-29 VITALS — BP 148/85 | HR 84 | Temp 99.2°F | Wt 300.0 lb

## 2011-04-29 DIAGNOSIS — R7309 Other abnormal glucose: Secondary | ICD-10-CM

## 2011-04-29 DIAGNOSIS — R609 Edema, unspecified: Secondary | ICD-10-CM

## 2011-04-29 DIAGNOSIS — E669 Obesity, unspecified: Secondary | ICD-10-CM

## 2011-04-29 DIAGNOSIS — I872 Venous insufficiency (chronic) (peripheral): Secondary | ICD-10-CM

## 2011-04-29 DIAGNOSIS — C8589 Other specified types of non-Hodgkin lymphoma, extranodal and solid organ sites: Secondary | ICD-10-CM

## 2011-04-29 DIAGNOSIS — D649 Anemia, unspecified: Secondary | ICD-10-CM

## 2011-04-29 HISTORY — DX: Venous insufficiency (chronic) (peripheral): I87.2

## 2011-04-29 MED ORDER — HEPARIN SOD (PORK) LOCK FLUSH 100 UNIT/ML IV SOLN
INTRAVENOUS | Status: AC
  Administered 2011-04-29: 500 [IU] via INTRAVENOUS
  Filled 2011-04-29: qty 5

## 2011-04-29 MED ORDER — SODIUM CHLORIDE 0.9 % IJ SOLN
10.0000 mL | Freq: Once | INTRAMUSCULAR | Status: AC
  Administered 2011-04-29: 10 mL via INTRAVENOUS
  Filled 2011-04-29: qty 10

## 2011-04-29 MED ORDER — HEPARIN SOD (PORK) LOCK FLUSH 100 UNIT/ML IV SOLN
500.0000 [IU] | Freq: Once | INTRAVENOUS | Status: AC
  Administered 2011-04-29: 500 [IU] via INTRAVENOUS
  Filled 2011-04-29: qty 5

## 2011-04-29 NOTE — Progress Notes (Signed)
[name] presented for Portacath access and flush. Proper placement of portacath confirmed by CXR. Portacath located rt chest wall accessed with  H 20 needle. Good blood return present. Portacath flushed with 20ml NS and 500U/107ml Heparin and needle removed intact. Procedure without incident. Patient tolerated procedure well.

## 2011-04-29 NOTE — Patient Instructions (Signed)
[facility]  Discharge Instructions  RECOMMENDATIONS MADE BY THE CONSULTANT AND ANY TEST RESULTS WILL BE SENT TO YOUR REFERRING DOCTOR.   EXAM FINDINGS BY MD TODAY AND SIGNS AND SYMPTOMS TO REPORT TO CLINIC OR PRIMARY MD:  Exam Good!   INSTRUCTIONS GIVEN AND DISCUSSED: Continue port flushes every 6 weeks. SPECIAL INSTRUCTIONS/FOLLOW-UP: Return to Clinic on  6 months   I acknowledge that I have been informed and understand all the instructions given to me and received a copy. I do not have any more questions at this time, but understand that I may call the Specialty Clinic at [facility] at [phone] during business hours should I have any further questions or need assistance in obtaining follow-up care.    __________________________________________  _____________  __________ Signature of Patient or Authorized Representative            Date                   Time    __________________________________________ Nurse's Signature

## 2011-04-29 NOTE — Progress Notes (Signed)
Cassell Smiles., MD [address] [place] [address]  1. LYMPHOMA  CBC, Differential, Comprehensive metabolic panel, Lactate dehydrogenase  2. ANEMIA    3. OBESITY-MORBID (>100')    4. Edema    5. Venous insufficiency      CURRENT THERAPY: S/P chemotherapy following stabilization of the spine.  Initial biopsy was on 07/13/2005 on his bone marrow.  Thoracic vertebra was biopsied on 02/04/2005 and mediastinum on 02/03/2005.  S/P R-CHOP and achieved an incomplete response, then underwent ICE chemotherapy pre-transplant and total body radiation with autologous stem cell transplant on 01/15/2006 and maintained on Rituxan for 2 years.  Thus far without recurrence.  INTERVAL HISTORY: [name] 60 y.o. male returns for  regular  visit for followup of Stage IV Diffuce Large B Cell Lymphoma.  The patient denies any complaints.  He denies any B symptoms including fevers, chills, night sweats, weight loss, and decrease in appetite.  I personally reviewed and went over laboratory results with the patient.  I went over pathophysiology and general patient education regarding his increased glucose level.    The patient reports that he has not felt any new lumps or bumps.   He reports that he is able to perform some maintenance and yard work for his church. The patient reports that he feels well.  He does tell me that he has gained weight and is "carrying more water than usual."  He explains that this is secondary to the types of foods he is eating lately.  He explains that since his children are back in school, the family has been eating out more than cooking at home.  He will try hard to refrain from eating out and do more home cooking that is healthier.   Past Medical History  Diagnosis Date  . Hypertension   . Asthma   . Large cell lymphoma 01/2005  . Venous insufficiency 04/29/2011    has DLBCL (diffuse large B cell lymphoma); HYPERLIPIDEMIA-MIXED; OBESITY-MORBID  (>100'); ANEMIA; HYPERTENSION, UNSPECIFIED; DIASTOLIC HEART FAILURE, CHRONIC; EDEMA; and Venous insufficiency on his problem list.     is allergic to penicillins.  Mr. Chatterjee does not currently have medications on file.  Past Surgical History  Procedure Date  . Multiple tooth extractions 04/2005  . Back surgery 2006    T6 vertebrae removed/titanium placed  . Lung biopsy 6/06  . Coloncscopy     Denies any headaches, dizziness, double vision, fevers, chills, night sweats, nausea, vomiting, diarrhea, constipation, chest pain, heart palpitations, shortness of breath, blood in stool, black tarry stool, urinary pain, urinary burning, urinary frequency, hematuria.   PHYSICAL EXAMINATION  ECOG PERFORMANCE STATUS: 1 - Symptomatic but completely ambulatory  Filed Vitals:   04/29/11 1028  BP: 148/85  Pulse: 84  Temp: 99.2 F (37.3 C)    GENERAL:alert, no distress, well nourished, well developed, comfortable, cooperative, obese and smiling SKIN: skin color, texture, turgor are normal HEAD: Normocephalic EYES: normal EARS: External ears normal OROPHARYNX:mucous membranes are moist  NECK: supple, no adenopathy, no bruits, no JVD, thyroid normal size, non-tender, without nodularity, no stridor, non-tender, trachea midline LYMPH:  no palpable lymphadenopathy, no hepatosplenomegaly BREAST:not examined LUNGS: clear to auscultation and percussion HEART: regular rate & rhythm, no murmurs, no gallops, S1 normal and S2 normal ABDOMEN:abdomen soft, non-tender, obese and normal bowel sounds BACK: Back symmetric, no curvature., No CVA tenderness EXTREMITIES:less then 2 second capillary refill, no joint deformities, effusion, or inflammation, no skin discoloration, no clubbing, no cyanosis, positive findings:  edema B/L LE woody infiltration of the calf with 2-3+ pitting edema.  Patient also has some UE edema which is noticeable in his finger (unable to remove his wedding band).  NEURO: alert &  oriented x 3 with fluent speech, no focal motor/sensory deficits, gait normal   LABORATORY DATA: CBC    Component Value Date/Time   WBC 4.0 03/26/2011 0925   RBC 3.92* 03/26/2011 0925   HGB 12.0* 03/26/2011 0925   HCT 35.5* 03/26/2011 0925   PLT 173 03/26/2011 0925   MCV 90.6 03/26/2011 0925   MCH 30.6 03/26/2011 0925   MCHC 33.8 03/26/2011 0925   RDW 15.7* 03/26/2011 0925   LYMPHSABS 1.0 03/26/2011 0925   MONOABS 0.5 03/26/2011 0925   EOSABS 0.2 03/26/2011 0925   BASOSABS 0.0 03/26/2011 0925      Chemistry      Component Value Date/Time   NA 139 03/26/2011 0925   K 3.8 03/26/2011 0925   CL 105 03/26/2011 0925   CO2 22 03/26/2011 0925   BUN 10 03/26/2011 0925   CREATININE 0.93 03/26/2011 0925      Component Value Date/Time   CALCIUM 9.2 03/26/2011 0925   ALKPHOS 70 03/26/2011 0925   AST 15 03/26/2011 0925   ALT 16 03/26/2011 0925   BILITOT 0.3 03/26/2011 0925        ASSESSMENT:  1. Stage IV Diffuse Large B Cell Lymphoma, S/P Chemotherapy, whole body radiation, and autologous stem cell transplant 2. Obesity 3. Pre-diabetes/borderline diabetes, will defer to PCP 4. Diffuse edema 5. Obesity   PLAN:  1. Lab work in 6 months: CBC diff, CMET, LDH 2. Return to the clinic in 6 months for follow-up. 3. I personally reviewed and went over laboratory results with the patient. 4. Went over patient education regarding pre-diabetes.   5. Encouraged healthier eating habits. 6. Encouraged weight loss.  All questions were answered. The patient knows to call the clinic with any problems, questions or concerns. We can certainly see the patient much sooner if necessary.  The patient and plan discussed with Glenford Peers, MD and he is in agreement with the aforementioned.  More than 50% of the time spent with the patient was utilized for counseling.  Allyanna Appleman

## 2011-05-07 ENCOUNTER — Encounter (HOSPITAL_COMMUNITY): Payer: Medicare Other

## 2011-05-18 LAB — CBC
Hemoglobin: 12.1 — ABNORMAL LOW
MCHC: 35.6
RBC: 3.58 — ABNORMAL LOW
WBC: 3.3 — ABNORMAL LOW

## 2011-05-18 LAB — COMPREHENSIVE METABOLIC PANEL
ALT: 23
AST: 22
Alkaline Phosphatase: 65
CO2: 25
Chloride: 108
GFR calc Af Amer: >60
GFR calc non Af Amer: >60
Glucose, Bld: 110 — ABNORMAL HIGH
Sodium: 138
Total Bilirubin: 0.5

## 2011-05-18 LAB — MAGNESIUM: Magnesium: 2.4

## 2011-05-18 LAB — DIFFERENTIAL
Basophils Absolute: 0
Basophils Relative: 1
Eosinophils Absolute: 0.2
Eosinophils Relative: 5
Neutrophils Relative %: 53

## 2011-05-18 LAB — LACTATE DEHYDROGENASE: LDH: 156

## 2011-05-25 LAB — DIFFERENTIAL
Basophils Absolute: 0
Basophils Relative: 0
Eosinophils Absolute: 0.2
Monocytes Relative: 13 — ABNORMAL HIGH
Neutro Abs: 3.2
Neutrophils Relative %: 64

## 2011-05-25 LAB — COMPREHENSIVE METABOLIC PANEL
ALT: 18
AST: 17
Albumin: 3.5
Alkaline Phosphatase: 68
BUN: 9
Chloride: 109
GFR calc Af Amer: >60
Potassium: 3.5
Sodium: 141
Total Bilirubin: 0.5
Total Protein: 5.7 — ABNORMAL LOW

## 2011-05-25 LAB — CBC
MCHC: 34.9
MCV: 95.1
Platelets: 194
RDW: 16 — ABNORMAL HIGH

## 2011-06-05 LAB — COMPREHENSIVE METABOLIC PANEL
ALT: 19
AST: 18
Albumin: 3.6
CO2: 24
Calcium: 8.5
Chloride: 109
Creatinine, Ser: 0.97
GFR calc Af Amer: >60
Sodium: 140

## 2011-06-05 LAB — CBC
MCV: 97
Platelets: 159
RBC: 3.59 — ABNORMAL LOW
WBC: 3.9 — ABNORMAL LOW

## 2011-06-05 LAB — DIFFERENTIAL
Eosinophils Absolute: 0.2
Eosinophils Relative: 4
Lymphocytes Relative: 18
Lymphs Abs: 0.7
Monocytes Absolute: 0.5

## 2011-06-11 ENCOUNTER — Encounter (HOSPITAL_COMMUNITY): Payer: Medicare Other | Attending: Oncology

## 2011-06-11 DIAGNOSIS — C8589 Other specified types of non-Hodgkin lymphoma, extranodal and solid organ sites: Secondary | ICD-10-CM | POA: Insufficient documentation

## 2011-06-11 MED ORDER — SODIUM CHLORIDE 0.9 % IJ SOLN
INTRAMUSCULAR | Status: AC
  Filled 2011-06-11: qty 10

## 2011-06-11 MED ORDER — HEPARIN SOD (PORK) LOCK FLUSH 100 UNIT/ML IV SOLN
INTRAVENOUS | Status: AC
  Filled 2011-06-11: qty 5

## 2011-06-11 NOTE — Progress Notes (Signed)
[name] presented for Portacath access and flush. Proper placement of portacath confirmed by CXR. Portacath located right chest wall accessed with  H 20 needle. Good blood return present. Portacath flushed with 20ml NS and 500U/64ml Heparin and needle removed intact. Procedure without incident. Patient tolerated procedure well.

## 2011-07-15 DIAGNOSIS — Z9481 Bone marrow transplant status: Secondary | ICD-10-CM | POA: Insufficient documentation

## 2011-08-05 ENCOUNTER — Encounter: Payer: Self-pay | Admitting: Cardiology

## 2011-09-10 ENCOUNTER — Encounter (HOSPITAL_COMMUNITY): Payer: Medicare Other | Attending: Oncology

## 2011-09-10 DIAGNOSIS — C8589 Other specified types of non-Hodgkin lymphoma, extranodal and solid organ sites: Secondary | ICD-10-CM

## 2011-09-10 DIAGNOSIS — Z452 Encounter for adjustment and management of vascular access device: Secondary | ICD-10-CM | POA: Diagnosis not present

## 2011-09-10 DIAGNOSIS — C8590 Non-Hodgkin lymphoma, unspecified, unspecified site: Secondary | ICD-10-CM

## 2011-09-10 MED ORDER — HEPARIN SOD (PORK) LOCK FLUSH 100 UNIT/ML IV SOLN
INTRAVENOUS | Status: AC
  Administered 2011-09-10: 500 [IU] via INTRAVENOUS
  Filled 2011-09-10: qty 5

## 2011-09-10 MED ORDER — HEPARIN SOD (PORK) LOCK FLUSH 100 UNIT/ML IV SOLN
500.0000 [IU] | Freq: Once | INTRAVENOUS | Status: AC
  Administered 2011-09-10: 500 [IU] via INTRAVENOUS
  Filled 2011-09-10: qty 5

## 2011-09-10 MED ORDER — SODIUM CHLORIDE 0.9 % IJ SOLN
10.0000 mL | INTRAMUSCULAR | Status: DC | PRN
  Administered 2011-09-10: 10 mL via INTRAVENOUS
  Filled 2011-09-10: qty 10

## 2011-09-10 NOTE — Progress Notes (Signed)
[name] presented for Portacath access and flush. Proper placement of portacath confirmed by CXR. Portacath located rt chest wall accessed with  H 20 needle. Good blood return present. Portacath flushed with 20ml NS and 500U/5ml Heparin and needle removed intact. Procedure without incident. Patient tolerated procedure well.   

## 2011-10-22 ENCOUNTER — Encounter (HOSPITAL_COMMUNITY): Payer: Medicare Other

## 2011-10-26 ENCOUNTER — Encounter (HOSPITAL_COMMUNITY): Payer: Medicare Other | Attending: Oncology

## 2011-10-26 DIAGNOSIS — C8589 Other specified types of non-Hodgkin lymphoma, extranodal and solid organ sites: Secondary | ICD-10-CM | POA: Insufficient documentation

## 2011-10-26 LAB — COMPREHENSIVE METABOLIC PANEL
Alkaline Phosphatase: 81 U/L (ref 39–117)
BUN: 12 mg/dL (ref 6–23)
Calcium: 9.3 mg/dL (ref 8.4–10.5)
Creatinine, Ser: 0.98 mg/dL (ref 0.50–1.35)
GFR calc Af Amer: >90 mL/min (ref >90)
Glucose, Bld: 141 mg/dL — ABNORMAL HIGH (ref 70–99)
Potassium: 3.7 mEq/L (ref 3.5–5.1)
Total Protein: 6.4 g/dL (ref 6.0–8.3)

## 2011-10-26 LAB — CBC
HCT: 36.8 % — ABNORMAL LOW (ref 39.0–52.0)
Hemoglobin: 11.9 g/dL — ABNORMAL LOW (ref 13.0–17.0)
MCH: 29.5 pg (ref 26.0–34.0)
MCHC: 32.3 g/dL (ref 30.0–36.0)
MCV: 91.1 fL (ref 78.0–100.0)
RBC: 4.04 MIL/uL — ABNORMAL LOW (ref 4.22–5.81)

## 2011-10-26 LAB — DIFFERENTIAL
Eosinophils Absolute: 0.2 10*3/uL (ref 0.0–0.7)
Eosinophils Relative: 4 % (ref 0–5)
Lymphs Abs: 1 10*3/uL (ref 0.7–4.0)
Monocytes Absolute: 0.7 10*3/uL (ref 0.1–1.0)
Monocytes Relative: 15 % — ABNORMAL HIGH (ref 3–12)

## 2011-10-26 LAB — LACTATE DEHYDROGENASE: LDH: 200 U/L (ref 94–250)

## 2011-10-26 MED ORDER — SODIUM CHLORIDE 0.9 % IJ SOLN
INTRAMUSCULAR | Status: AC
  Administered 2011-10-26: 10 mL via INTRAVENOUS
  Filled 2011-10-26: qty 10

## 2011-10-26 MED ORDER — HEPARIN SOD (PORK) LOCK FLUSH 100 UNIT/ML IV SOLN
500.0000 [IU] | Freq: Once | INTRAVENOUS | Status: AC
  Administered 2011-10-26: 500 [IU] via INTRAVENOUS
  Filled 2011-10-26: qty 5

## 2011-10-26 MED ORDER — HEPARIN SOD (PORK) LOCK FLUSH 100 UNIT/ML IV SOLN
INTRAVENOUS | Status: AC
  Administered 2011-10-26: 500 [IU] via INTRAVENOUS
  Filled 2011-10-26: qty 5

## 2011-10-26 MED ORDER — SODIUM CHLORIDE 0.9 % IJ SOLN
10.0000 mL | INTRAMUSCULAR | Status: DC | PRN
  Administered 2011-10-26: 10 mL via INTRAVENOUS
  Filled 2011-10-26: qty 10

## 2011-10-26 NOTE — Progress Notes (Signed)
Port accessed. Specimen obtained for lab.  Tolerated well.

## 2011-10-28 ENCOUNTER — Encounter (HOSPITAL_BASED_OUTPATIENT_CLINIC_OR_DEPARTMENT_OTHER): Payer: Medicare Other | Admitting: Oncology

## 2011-10-28 ENCOUNTER — Encounter (HOSPITAL_COMMUNITY): Payer: Self-pay | Admitting: Oncology

## 2011-10-28 VITALS — BP 136/71 | HR 84 | Temp 98.0°F | Ht 70.0 in | Wt 316.4 lb

## 2011-10-28 DIAGNOSIS — I872 Venous insufficiency (chronic) (peripheral): Secondary | ICD-10-CM | POA: Diagnosis not present

## 2011-10-28 DIAGNOSIS — D649 Anemia, unspecified: Secondary | ICD-10-CM

## 2011-10-28 DIAGNOSIS — C8589 Other specified types of non-Hodgkin lymphoma, extranodal and solid organ sites: Secondary | ICD-10-CM

## 2011-10-28 DIAGNOSIS — C8330 Diffuse large B-cell lymphoma, unspecified site: Secondary | ICD-10-CM

## 2011-10-28 DIAGNOSIS — R7309 Other abnormal glucose: Secondary | ICD-10-CM

## 2011-10-28 NOTE — Progress Notes (Signed)
CC:   [name]. Frank Gambler, MD [name], M.D. Frank C. Daleen Squibb, MD, [facility] Stefani Dama, M.D.  DIAGNOSES: 1. Diffuse large B-cell lymphoma presented with extensive stage IVA     disease with thoracic vertebral involvement biopsy on 02/04/2005.     He had chemotherapy after surgical stabilization of his thoracic     spine.  He was treated with R-CHOP chemotherapy and achieved an     incomplete response, went on to have ICE chemotherapy pretransplant     and total body radiation with stem cell transplant on 01/15/2006     which was on autologous stem cell transplant.  He was then     maintained on Rituxan for 2 years.  He has thus far had no     recurrence.  His last PET scan of November 2011 was negative.  We     have not repeated it since. 2. Venous insufficiency of the lower extremities probably due to     marked obesity. 3. Severe obesity now weighing 316 pounds on a 5 foot 10 inch frame. 4. Possible methicillin-resistant Staphylococcus aureus of the skin in     the past. 5. Colonoscopy April 2006, needs another one in 2016. [name] is here today with his wife.  He still remains mildly anemic but that is not new or different.  Unfortunately, his sugars are occasionally high and he may well be a prediabetic.  He saw Dr. Marlaine Hind in November but we do not have that note yet but it is probably because of the EPIC system that they initiated as well.  He is asymptomatic from the standpoint of his lymphoma.  His biggest problem is his weight which is extremely high at this juncture with a BMI that is 45.5.  He does have his brawny induration of both lower extremities from this chronic edematous state and again most likely all from his obesity.  He does not exercise on a regular basis, does not eat the right foods and we clearly went over those issues for about 25 minutes all by itself.  EXAM:  Otherwise he has no adenopathy.  No obvious hepatosplenomegaly. Lungs:  Clear to  auscultation and percussion.  Heart:  Shows a regular rhythm and rate without murmur, rub, or gallop.  Port is intact in the right upper chest wall.  Vital signs:  His blood pressure is 136/71 in the right arm sitting position.  Pulse 84 and regular, respirations 16 and unlabored, and he is afebrile.  Right now he denies any pain.  We went over his diet in detail and basically they eat out most of the time.  They do not watch what they are really eating, etc.  He eats a lot of carbohydrates.  So we talked about what he needs eat.  We offered him a dietary consult once again.  He did that before but they just do not follow that.  He needs an exercise program.  I think that can be worked out with a Building surveyor.  His port he would like to have out and what we decided is after Dr. Greggory [name] sees him in November of this year, we will probably let him get that out.  We will just have to see him once a year thereafter anyway for blood work and followup.  So he looks good. He still seems free of disease.  His biggest problems going forward are this obesity issue.  I think we addressed that quite extensively today.  ______________________________ Ladona Horns. Mariel Sleet, MD ESN/MEDQ  D:  10/28/2011  T:  10/28/2011  Job:  [id number]

## 2011-10-28 NOTE — Patient Instructions (Signed)
[name]  [id number] [birth year]   [facility]  Discharge Instructions  RECOMMENDATIONS MADE BY THE CONSULTANT AND ANY TEST RESULTS WILL BE SENT TO YOUR REFERRING DOCTOR.   EXAM FINDINGS BY MD TODAY AND SIGNS AND SYMPTOMS TO REPORT TO CLINIC OR PRIMARY MD: you are doing well concerning your lymphoma.  Need to work on losing weight. Try to eat 6 - 9 servings of fibrous foods daily and increase your exercise.  Could use recliner bike, swimming or walking if it does not hurt your back.  After you see Dr. Greggory Stallion in November you can get your port out.  MEDICATIONS PRESCRIBED: none   INSTRUCTIONS GIVEN AND DISCUSSED: Other :  Report recurring infections, any new lumps, etc.  SPECIAL INSTRUCTIONS/FOLLOW-UP: Return to Clinic :  See schedule  I acknowledge that I have been informed and understand all the instructions given to me and received a copy. I do not have any more questions at this time, but understand that I may call the Specialty Clinic at [facility] at [phone] during business hours should I have any further questions or need assistance in obtaining follow-up care.    __________________________________________  _____________  __________ Signature of Patient or Authorized Representative            Date                   Time    __________________________________________ Nurse's Signature

## 2011-10-28 NOTE — Progress Notes (Signed)
This office note has been dictated.

## 2011-11-10 ENCOUNTER — Encounter (HOSPITAL_COMMUNITY): Payer: Self-pay | Admitting: Dietician

## 2011-11-10 NOTE — Progress Notes (Signed)
[facility] Diabetes Class Completion  Date:November 10, 2011  Time: 10:00 AM  Pt attended [facility]'s Diabetes Class on November 10, 2011.   Patient was educated on the following topics: carbohydrate metabolism in relation to diabetes, sources of carbohydrate, carbohydrate counting, meal planning strategies, food label reading, and portion control.   Keva Darty A. Kayan, RD, LDN Date:November 10, 2011 Time: 10:00 AM 

## 2011-12-07 ENCOUNTER — Other Ambulatory Visit (HOSPITAL_COMMUNITY): Payer: Medicare Other

## 2011-12-07 ENCOUNTER — Encounter (HOSPITAL_COMMUNITY): Payer: Medicare Other

## 2011-12-07 ENCOUNTER — Encounter (HOSPITAL_COMMUNITY): Payer: Medicare Other | Attending: Oncology

## 2011-12-07 DIAGNOSIS — C8589 Other specified types of non-Hodgkin lymphoma, extranodal and solid organ sites: Secondary | ICD-10-CM | POA: Diagnosis not present

## 2011-12-07 DIAGNOSIS — Z452 Encounter for adjustment and management of vascular access device: Secondary | ICD-10-CM

## 2011-12-07 DIAGNOSIS — C8330 Diffuse large B-cell lymphoma, unspecified site: Secondary | ICD-10-CM

## 2011-12-07 MED ORDER — SODIUM CHLORIDE 0.9 % IJ SOLN
INTRAMUSCULAR | Status: AC
  Filled 2011-12-07: qty 10

## 2011-12-07 MED ORDER — HEPARIN SOD (PORK) LOCK FLUSH 100 UNIT/ML IV SOLN
INTRAVENOUS | Status: AC
  Filled 2011-12-07: qty 5

## 2011-12-07 MED ORDER — SODIUM CHLORIDE 0.9 % IJ SOLN
10.0000 mL | INTRAMUSCULAR | Status: DC | PRN
  Administered 2011-12-07: 10 mL via INTRAVENOUS
  Filled 2011-12-07: qty 10

## 2011-12-07 MED ORDER — HEPARIN SOD (PORK) LOCK FLUSH 100 UNIT/ML IV SOLN
500.0000 [IU] | Freq: Once | INTRAVENOUS | Status: AC
  Administered 2011-12-07: 500 [IU] via INTRAVENOUS
  Filled 2011-12-07: qty 5

## 2011-12-07 NOTE — Progress Notes (Signed)
Port flushed without difficulty.  Tolerated well.

## 2012-01-15 ENCOUNTER — Encounter (HOSPITAL_COMMUNITY): Payer: Medicare Other | Attending: Oncology

## 2012-01-15 DIAGNOSIS — Z452 Encounter for adjustment and management of vascular access device: Secondary | ICD-10-CM

## 2012-01-15 DIAGNOSIS — C8589 Other specified types of non-Hodgkin lymphoma, extranodal and solid organ sites: Secondary | ICD-10-CM

## 2012-01-15 DIAGNOSIS — C8330 Diffuse large B-cell lymphoma, unspecified site: Secondary | ICD-10-CM

## 2012-01-15 MED ORDER — HEPARIN SOD (PORK) LOCK FLUSH 100 UNIT/ML IV SOLN
INTRAVENOUS | Status: AC
  Filled 2012-01-15: qty 5

## 2012-01-15 MED ORDER — SODIUM CHLORIDE 0.9 % IJ SOLN
10.0000 mL | INTRAMUSCULAR | Status: DC | PRN
  Administered 2012-01-15: 10 mL via INTRAVENOUS
  Filled 2012-01-15: qty 10

## 2012-01-15 MED ORDER — SODIUM CHLORIDE 0.9 % IJ SOLN
INTRAMUSCULAR | Status: AC
  Filled 2012-01-15: qty 10

## 2012-01-15 MED ORDER — HEPARIN SOD (PORK) LOCK FLUSH 100 UNIT/ML IV SOLN
500.0000 [IU] | Freq: Once | INTRAVENOUS | Status: AC
  Administered 2012-01-15: 500 [IU] via INTRAVENOUS
  Filled 2012-01-15: qty 5

## 2012-01-15 NOTE — Progress Notes (Signed)
Tolerated port flush well.  Good blood return. 

## 2012-01-19 ENCOUNTER — Encounter (HOSPITAL_COMMUNITY): Payer: Medicare Other

## 2012-02-29 ENCOUNTER — Encounter (HOSPITAL_COMMUNITY): Payer: Medicare Other

## 2012-02-29 ENCOUNTER — Encounter (HOSPITAL_COMMUNITY): Payer: Medicare Other | Attending: Oncology

## 2012-02-29 DIAGNOSIS — Z452 Encounter for adjustment and management of vascular access device: Secondary | ICD-10-CM

## 2012-02-29 DIAGNOSIS — C8589 Other specified types of non-Hodgkin lymphoma, extranodal and solid organ sites: Secondary | ICD-10-CM | POA: Diagnosis not present

## 2012-02-29 DIAGNOSIS — C8330 Diffuse large B-cell lymphoma, unspecified site: Secondary | ICD-10-CM

## 2012-02-29 MED ORDER — SODIUM CHLORIDE 0.9 % IJ SOLN
10.0000 mL | INTRAMUSCULAR | Status: DC | PRN
  Administered 2012-02-29: 10 mL via INTRAVENOUS
  Filled 2012-02-29: qty 10

## 2012-02-29 MED ORDER — HEPARIN SOD (PORK) LOCK FLUSH 100 UNIT/ML IV SOLN
500.0000 [IU] | Freq: Once | INTRAVENOUS | Status: AC
  Administered 2012-02-29: 500 [IU] via INTRAVENOUS
  Filled 2012-02-29: qty 5

## 2012-02-29 MED ORDER — SODIUM CHLORIDE 0.9 % IJ SOLN
INTRAMUSCULAR | Status: AC
  Filled 2012-02-29: qty 10

## 2012-02-29 MED ORDER — HEPARIN SOD (PORK) LOCK FLUSH 100 UNIT/ML IV SOLN
INTRAVENOUS | Status: AC
  Filled 2012-02-29: qty 5

## 2012-02-29 NOTE — Progress Notes (Signed)
Tolerated port flush well. 

## 2012-04-11 ENCOUNTER — Encounter (HOSPITAL_COMMUNITY): Payer: Medicare Other | Attending: Oncology

## 2012-04-11 ENCOUNTER — Other Ambulatory Visit (HOSPITAL_COMMUNITY): Payer: Medicare Other

## 2012-04-11 ENCOUNTER — Encounter (HOSPITAL_COMMUNITY): Payer: Medicare Other

## 2012-04-11 DIAGNOSIS — C8589 Other specified types of non-Hodgkin lymphoma, extranodal and solid organ sites: Secondary | ICD-10-CM | POA: Diagnosis not present

## 2012-04-11 DIAGNOSIS — Z452 Encounter for adjustment and management of vascular access device: Secondary | ICD-10-CM | POA: Diagnosis not present

## 2012-04-11 DIAGNOSIS — C8330 Diffuse large B-cell lymphoma, unspecified site: Secondary | ICD-10-CM

## 2012-04-11 MED ORDER — HEPARIN SOD (PORK) LOCK FLUSH 100 UNIT/ML IV SOLN
500.0000 [IU] | Freq: Once | INTRAVENOUS | Status: AC
  Administered 2012-04-11: 500 [IU] via INTRAVENOUS
  Filled 2012-04-11: qty 5

## 2012-04-11 MED ORDER — SODIUM CHLORIDE 0.9 % IJ SOLN
10.0000 mL | INTRAMUSCULAR | Status: DC | PRN
  Administered 2012-04-11: 10 mL via INTRAVENOUS
  Filled 2012-04-11: qty 10

## 2012-04-11 NOTE — Progress Notes (Signed)
Tolerated port flush well.  Good blood return. 

## 2012-04-12 ENCOUNTER — Ambulatory Visit (HOSPITAL_COMMUNITY): Payer: Medicare Other | Admitting: Oncology

## 2012-05-23 ENCOUNTER — Encounter (HOSPITAL_COMMUNITY): Payer: Medicare Other | Attending: Oncology

## 2012-05-23 DIAGNOSIS — C8589 Other specified types of non-Hodgkin lymphoma, extranodal and solid organ sites: Secondary | ICD-10-CM

## 2012-05-23 DIAGNOSIS — C8330 Diffuse large B-cell lymphoma, unspecified site: Secondary | ICD-10-CM

## 2012-05-23 LAB — DIFFERENTIAL
Basophils Relative: 1 % (ref 0–1)
Monocytes Relative: 13 % — ABNORMAL HIGH (ref 3–12)
Neutro Abs: 2 10*3/uL (ref 1.7–7.7)
Neutrophils Relative %: 55 % (ref 43–77)

## 2012-05-23 LAB — COMPREHENSIVE METABOLIC PANEL
ALT: 19 U/L (ref 0–53)
AST: 18 U/L (ref 0–37)
Albumin: 3.4 g/dL — ABNORMAL LOW (ref 3.5–5.2)
Alkaline Phosphatase: 78 U/L (ref 39–117)
BUN: 10 mg/dL (ref 6–23)
Chloride: 107 mEq/L (ref 96–112)
Potassium: 3.4 mEq/L — ABNORMAL LOW (ref 3.5–5.1)
Sodium: 140 mEq/L (ref 135–145)
Total Bilirubin: 0.3 mg/dL (ref 0.3–1.2)

## 2012-05-23 LAB — CBC
Hemoglobin: 12 g/dL — ABNORMAL LOW (ref 13.0–17.0)
MCHC: 32.5 g/dL (ref 30.0–36.0)
RBC: 4.15 MIL/uL — ABNORMAL LOW (ref 4.22–5.81)

## 2012-05-23 LAB — LACTATE DEHYDROGENASE: LDH: 208 U/L (ref 94–250)

## 2012-05-23 MED ORDER — SODIUM CHLORIDE 0.9 % IJ SOLN
10.0000 mL | INTRAMUSCULAR | Status: DC | PRN
  Administered 2012-05-23: 10 mL via INTRAVENOUS
  Filled 2012-05-23: qty 10

## 2012-05-23 MED ORDER — HEPARIN SOD (PORK) LOCK FLUSH 100 UNIT/ML IV SOLN
500.0000 [IU] | Freq: Once | INTRAVENOUS | Status: AC
  Administered 2012-05-23: 500 [IU] via INTRAVENOUS
  Filled 2012-05-23: qty 5

## 2012-05-23 MED ORDER — HEPARIN SOD (PORK) LOCK FLUSH 100 UNIT/ML IV SOLN
INTRAVENOUS | Status: AC
  Filled 2012-05-23: qty 5

## 2012-05-23 MED ORDER — SODIUM CHLORIDE 0.9 % IJ SOLN
INTRAMUSCULAR | Status: AC
  Filled 2012-05-23: qty 10

## 2012-05-23 NOTE — Progress Notes (Signed)
Tolerated port flush well. 

## 2012-05-25 ENCOUNTER — Telehealth (HOSPITAL_COMMUNITY): Payer: Self-pay | Admitting: *Deleted

## 2012-05-25 NOTE — Telephone Encounter (Signed)
Kdur called in 

## 2012-06-08 ENCOUNTER — Telehealth (HOSPITAL_COMMUNITY): Payer: Self-pay | Admitting: Oncology

## 2012-06-08 ENCOUNTER — Encounter (HOSPITAL_COMMUNITY): Payer: Medicare Other | Attending: Oncology | Admitting: Oncology

## 2012-06-08 ENCOUNTER — Encounter (HOSPITAL_COMMUNITY): Payer: Self-pay | Admitting: Oncology

## 2012-06-08 VITALS — BP 133/79 | HR 77 | Temp 97.7°F | Resp 18 | Wt 300.0 lb

## 2012-06-08 DIAGNOSIS — E876 Hypokalemia: Secondary | ICD-10-CM | POA: Diagnosis not present

## 2012-06-08 DIAGNOSIS — Z23 Encounter for immunization: Secondary | ICD-10-CM

## 2012-06-08 DIAGNOSIS — C8330 Diffuse large B-cell lymphoma, unspecified site: Secondary | ICD-10-CM

## 2012-06-08 DIAGNOSIS — C8589 Other specified types of non-Hodgkin lymphoma, extranodal and solid organ sites: Secondary | ICD-10-CM | POA: Diagnosis not present

## 2012-06-08 MED ORDER — INFLUENZA VIRUS VACC SPLIT PF IM SUSP
0.5000 mL | Freq: Once | INTRAMUSCULAR | Status: AC
  Administered 2012-06-08: 0.5 mL via INTRAMUSCULAR

## 2012-06-08 MED ORDER — INFLUENZA VIRUS VACC SPLIT PF IM SUSP
INTRAMUSCULAR | Status: AC
  Filled 2012-06-08: qty 0.5

## 2012-06-08 NOTE — Progress Notes (Signed)
[name] presents today for injection per MD orders. Flu vaccine 0.5 ml administered im in right Upper Arm. Administration without incident. Patient tolerated well.

## 2012-06-08 NOTE — Progress Notes (Signed)
Diagnosis #1 diffuse large B-cell lymphoma presenting with extensive stage IV a disease including thoracic vertebral body involvement with biopsy on 02/04/2005. He had surgical stabilization of his thoracic spine followed by chemotherapy with R. CHOP. He went on to have ICE chemotherapy pre-transplant followed by total body irradiation followed by stem cell transplant on 01/15/2006. This was an autologous stem cell transplant. We then maintain him on rituximab for 2 years. He did not get a complete remission with the R. CHOP chemotherapy initially. His last PET scan was in November 2011. Diagnosis #2 chronic lower extremity edema secondary to marked obesity Diagnosis #3 morbid obesity Diagnosis #4 possible MRSA of the skin in the past Diagnosis #5 colonoscopy in April 2006 and he is due for another one in 2016 The patient remains asymptomatic. He is staying active has lost approximately 16 pounds. This is great but he still has these woody edema of both legs. He never filled the prescription for the compression stockings. I want him to try once again. We will give an intermediate compression knee-high stockings. He needs to stay active and continued to lose weight. He has no B. symptomatology. Back is stable. Physical exam shows no change. He has no lymphadenopathy. His obese abdomen. There is no obvious mass. Bowel sounds are normal. Lungs are clear. Heart shows no murmur rub or gallop. He has a regular rhythm and rate. He has no arm edema. He is alert and oriented.  He will see Dr. Greggory Stallion in November. His blood work on September 30 showed minimal hypokalemia and he is now back on his potassium pill. His diuretic for his blood pressure is most likely the cause of his hypokalemia. His white count remains intermittently low but without change. His hemoglobin was excellent at 12 g. LDH was normal We will see him in 6 months sooner if need be he would like his port removed if it is okay with Dr. Greggory Stallion and he will  discuss this with Dr. Greggory Stallion

## 2012-06-08 NOTE — Patient Instructions (Addendum)
[facility]  Discharge Instructions  RECOMMENDATIONS MADE BY THE CONSULTANT AND ANY TEST RESULTS WILL BE SENT TO YOUR REFERRING DOCTOR.   EXAM FINDINGS BY MD TODAY AND SIGNS AND SYMPTOMS TO REPORT TO CLINIC OR PRIMARY MD: Exam good  MEDICATIONS PRESCRIBED: Take your potassium daily. You will probably need this for the rest of your life.   INSTRUCTIONS GIVEN AND DISCUSSED: You can have port out if Dr. Greggory Stallion says it is ok. Flu vaccine today Intermediate compression stockings for your legs. Put them on before you get up every morning  SPECIAL INSTRUCTIONS/FOLLOW-UP:  Referral to rourk for f/u colonostomy---they will call you with appt.   I acknowledge that I have been informed and understand all the instructions given to me and received a copy. I do not have any more questions at this time, but understand that I may call the Specialty Clinic at [facility] at [phone] during business hours should I have any further questions or need assistance in obtaining follow-up care.    __________________________________________  _____________  __________ Signature of Patient or Authorized Representative            Date                   Time    __________________________________________ Nurse's Signature

## 2012-06-10 ENCOUNTER — Encounter: Payer: Self-pay | Admitting: Internal Medicine

## 2012-06-13 ENCOUNTER — Encounter: Payer: Self-pay | Admitting: Gastroenterology

## 2012-06-13 ENCOUNTER — Ambulatory Visit (INDEPENDENT_AMBULATORY_CARE_PROVIDER_SITE_OTHER): Payer: Medicare Other | Admitting: Gastroenterology

## 2012-06-13 ENCOUNTER — Other Ambulatory Visit: Payer: Self-pay | Admitting: Internal Medicine

## 2012-06-13 VITALS — BP 146/77 | HR 84 | Temp 98.2°F | Ht 71.0 in | Wt 302.0 lb

## 2012-06-13 DIAGNOSIS — Z86010 Personal history of colonic polyps: Secondary | ICD-10-CM | POA: Diagnosis not present

## 2012-06-13 DIAGNOSIS — D649 Anemia, unspecified: Secondary | ICD-10-CM | POA: Diagnosis not present

## 2012-06-13 DIAGNOSIS — Z860101 Personal history of adenomatous and serrated colon polyps: Secondary | ICD-10-CM | POA: Insufficient documentation

## 2012-06-13 DIAGNOSIS — K635 Polyp of colon: Secondary | ICD-10-CM

## 2012-06-13 MED ORDER — PEG 3350-KCL-NA BICARB-NACL 420 G PO SOLR: 4000.0000 mL | ORAL | Status: DC

## 2012-06-13 NOTE — Assessment & Plan Note (Signed)
Due for surveillance colonoscopy. H/O chronic stable mild anemia s/p stem cell transplant for lymphoma in 2007. Colonoscopy in near future.  I have discussed the risks, alternatives, benefits with regards to but not limited to the risk of reaction to medication, bleeding, infection, perforation and the patient is agreeable to proceed. Written consent to be obtained.

## 2012-06-13 NOTE — Progress Notes (Signed)
Primary Care Physician:  Cassell Smiles., MD  Primary Gastroenterologist:  Roetta Sessions, MD   Chief Complaint  Patient presents with  . Colonoscopy    HPI:  [name] is a 61 y.o. male here to schedule surveillance colonoscopy. We actually sent him a letter in 2011 but patient did not follow through at that time. His last colonoscopy was in 11/2004. He had polyps in left colon ablated/removed, two submucosal lesions c/w lipomas not manipulated. Path unavailable to me at this time but has been requested. Since we saw this nice gentlemen, he was diagnosed with B cell lymphoma in 2006. He underwent autologous stem cell transplant in 2007 but did require chemo afterwards to obtain remission. He is followed by Dr. Mariel Sleet. He has h/o chronic but stable mild anemia.   BM daily. No melena, brbpr. No abdominal pain. Some postprandial bloating. No heartburn. No dysphagia/odynophagia.   Current Outpatient Prescriptions  Medication Sig Dispense Refill  . baclofen (LIORESAL) 10 MG tablet Take 10 mg by mouth 3 (three) times daily as needed.      Marland Kitchen ibuprofen (ADVIL,MOTRIN) 200 MG tablet Take 200 mg by mouth every 6 (six) hours as needed. Use seldom.      . potassium chloride SA (K-DUR,KLOR-CON) 20 MEQ tablet Take 20 mEq by mouth daily. Taking one a day now      . triamterene-hydrochlorothiazide (DYAZIDE) 37.5-25 MG per capsule Take 1 capsule by mouth every morning.        Allergies as of 06/13/2012 - Review Complete 06/13/2012  Allergen Reaction Noted  . Penicillins Rash 04/29/2011    Past Medical History  Diagnosis Date  . Hypertension   . Asthma   . 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 Venous insufficiency 04/29/2011  . Obesity, morbid (more than 100 lbs over ideal weight or BMI > 40)   . Edema   . Heart failure, diastolic, chronic     patient denies  . Anemia     chronic, stable  . Hyperlipidemia, mixed     Past Surgical History  Procedure Date   . Multiple tooth extractions 04/2005  . Back surgery 2006    T6 vertebrae removed/titanium placed  . Lung biopsy 6/06  . Colonoscopy 11/24/2004    Polyps in the left colon ablated/removed as described above.  Two submucosal lesions consistent with lipomas as described above not  manipulated./ Normal rectum  . Port a cath placement     Family History  Problem Relation Age of Onset  . Cancer Mother     lung  . Cancer Father     prostate  . Colon cancer Neg Hx     History   Social History  . Marital Status: Married    Spouse Name: N/A    Number of Children: 2  . Years of Education: N/A   Occupational History  . disability due to back    Social History Main Topics  . Smoking status: Former Smoker -- 0.5 packs/day for 15 years  . Smokeless tobacco: Never Used  . Alcohol Use: No  . Drug Use: No  . Sexually Active: Not on file   Other Topics Concern  . Not on file   Social History Narrative   MarriedNo regular exercise      ROS:  General: Negative for anorexia, weight loss, fever, chills, fatigue, weakness. Eyes: Negative for vision changes.  ENT: Negative for hoarseness, difficulty swallowing , nasal congestion. CV: Negative for  chest pain, angina, palpitations, dyspnea on exertion. Chronic peripheral edema.  Respiratory: Negative for dyspnea at rest, dyspnea on exertion, cough, sputum, wheezing.  GI: See history of present illness. GU:  Negative for dysuria, hematuria, urinary incontinence, urinary frequency, nocturnal urination.  MS: Negative for joint pain. chronic low back pain.  Derm: Negative for rash or itching.  Neuro: Negative for weakness, abnormal sensation, seizure, frequent headaches, memory loss, confusion.  Psych: Negative for anxiety, depression, suicidal ideation, hallucinations.  Endo: Negative for unusual weight change.  Heme: Negative for bruising or bleeding. Allergy: Negative for rash or hives.    Physical Examination:  BP 146/77   Pulse 84  Temp 98.2 F (36.8 C) (Temporal)  Ht 5\' 11"  (1.803 m)  Wt 302 lb (136.986 kg)  BMI 42.12 kg/m2   General: Well-nourished, well-developed in no acute distress. obese Head: Normocephalic, atraumatic.   Eyes: Conjunctiva pink, no icterus. Mouth: Oropharyngeal mucosa moist and pink , no lesions erythema or exudate. Neck: Supple without thyromegaly, masses, or lymphadenopathy.  Lungs: Clear to auscultation bilaterally.  Heart: Regular rate and rhythm, no murmurs rubs or gallops.  Abdomen: Bowel sounds are normal, nontender, nondistended, no hepatosplenomegaly or masses, no abdominal bruits or    hernia , no rebound or guarding.   Rectal: not performed Extremities: chronic venous stasis changes. 1+ lower extremity edema. No clubbing or deformities.  Neuro: Alert and oriented x 4 , grossly normal neurologically.  Skin: Warm and dry, no rash or jaundice.   Psych: Alert and cooperative, normal mood and affect.  Labs: Lab Results  Component Value Date   WBC 3.6* 05/23/2012   HGB 12.0* 05/23/2012   HCT 36.9* 05/23/2012   MCV 88.9 05/23/2012   PLT 191 05/23/2012   Lab Results  Component Value Date   CREATININE 0.99 05/23/2012   BUN 10 05/23/2012   NA 140 05/23/2012   K 3.4* 05/23/2012   CL 107 05/23/2012   CO2 24 05/23/2012   Lab Results  Component Value Date   ALT 19 05/23/2012   AST 18 05/23/2012   ALKPHOS 78 05/23/2012   BILITOT 0.3 05/23/2012     Imaging Studies: No results found.

## 2012-06-13 NOTE — Progress Notes (Signed)
Faxed to PCP

## 2012-06-13 NOTE — Patient Instructions (Signed)
We have scheduled you for a colonoscopy with Dr. Rourk. Please see separate instructions. 

## 2012-06-14 NOTE — Progress Notes (Signed)
Received path from 11/2004 colonoscopy. Patient had adenomatous polyp at 30cm.

## 2012-06-17 ENCOUNTER — Other Ambulatory Visit: Payer: Self-pay | Admitting: Internal Medicine

## 2012-06-17 DIAGNOSIS — D649 Anemia, unspecified: Secondary | ICD-10-CM

## 2012-06-17 DIAGNOSIS — Z86010 Personal history of colonic polyps: Secondary | ICD-10-CM

## 2012-06-20 ENCOUNTER — Ambulatory Visit (HOSPITAL_COMMUNITY)
Admission: RE | Admit: 2012-06-20 | Discharge: 2012-06-20 | Disposition: A | Discharge status: Home / Self Care | Payer: Medicare Other | Source: Ambulatory Visit | Attending: Internal Medicine | Admitting: Internal Medicine

## 2012-06-20 ENCOUNTER — Encounter (HOSPITAL_COMMUNITY)
Admission: RE | Disposition: A | Discharge status: Home / Self Care | Payer: Self-pay | Source: Ambulatory Visit | Attending: Internal Medicine

## 2012-06-20 ENCOUNTER — Encounter (HOSPITAL_COMMUNITY): Payer: Self-pay | Admitting: *Deleted

## 2012-06-20 DIAGNOSIS — K626 Ulcer of anus and rectum: Secondary | ICD-10-CM | POA: Diagnosis not present

## 2012-06-20 DIAGNOSIS — I10 Essential (primary) hypertension: Secondary | ICD-10-CM | POA: Diagnosis not present

## 2012-06-20 DIAGNOSIS — Z1211 Encounter for screening for malignant neoplasm of colon: Secondary | ICD-10-CM | POA: Diagnosis not present

## 2012-06-20 DIAGNOSIS — Z860100 Personal history of colon polyps, unspecified: Secondary | ICD-10-CM | POA: Insufficient documentation

## 2012-06-20 DIAGNOSIS — D126 Benign neoplasm of colon, unspecified: Secondary | ICD-10-CM

## 2012-06-20 DIAGNOSIS — D649 Anemia, unspecified: Secondary | ICD-10-CM

## 2012-06-20 DIAGNOSIS — K621 Rectal polyp: Secondary | ICD-10-CM | POA: Diagnosis not present

## 2012-06-20 DIAGNOSIS — D128 Benign neoplasm of rectum: Secondary | ICD-10-CM | POA: Diagnosis not present

## 2012-06-20 DIAGNOSIS — Z09 Encounter for follow-up examination after completed treatment for conditions other than malignant neoplasm: Secondary | ICD-10-CM | POA: Diagnosis not present

## 2012-06-20 DIAGNOSIS — K620 Anal polyp: Secondary | ICD-10-CM | POA: Diagnosis not present

## 2012-06-20 HISTORY — PX: COLONOSCOPY: SHX5424

## 2012-06-20 SURGERY — COLONOSCOPY
Anesthesia: Moderate Sedation

## 2012-06-20 MED ORDER — MEPERIDINE HCL 100 MG/ML IJ SOLN
INTRAMUSCULAR | Status: AC
  Filled 2012-06-20: qty 1

## 2012-06-20 MED ORDER — MIDAZOLAM HCL 5 MG/5ML IJ SOLN
INTRAMUSCULAR | Status: DC | PRN
  Administered 2012-06-20: 1 mg via INTRAVENOUS
  Administered 2012-06-20: 2 mg via INTRAVENOUS
  Administered 2012-06-20 (×2): 1 mg via INTRAVENOUS

## 2012-06-20 MED ORDER — MIDAZOLAM HCL 5 MG/5ML IJ SOLN
INTRAMUSCULAR | Status: AC
  Filled 2012-06-20: qty 10

## 2012-06-20 MED ORDER — MEPERIDINE HCL 100 MG/ML IJ SOLN
INTRAMUSCULAR | Status: DC | PRN
  Administered 2012-06-20: 25 mg via INTRAVENOUS
  Administered 2012-06-20: 50 mg via INTRAVENOUS

## 2012-06-20 MED ORDER — SODIUM CHLORIDE 0.45 % IV SOLN
INTRAVENOUS | Status: DC
  Administered 2012-06-20: 13:00:00 via INTRAVENOUS

## 2012-06-20 MED ORDER — STERILE WATER FOR IRRIGATION IR SOLN
Status: DC | PRN
  Administered 2012-06-20: 14:00:00

## 2012-06-20 NOTE — H&P (View-Only) (Signed)
Primary Care Physician:  FUSCO,LAWRENCE J., MD  Primary Gastroenterologist:  Michael Rourk, MD   Chief Complaint  Patient presents with  . Colonoscopy    HPI:  [name] is a 61 y.o. male here to schedule surveillance colonoscopy. We actually sent him a letter in 2011 but patient did not follow through at that time. His last colonoscopy was in 11/2004. He had polyps in left colon ablated/removed, two submucosal lesions c/w lipomas not manipulated. Path unavailable to me at this time but has been requested. Since we saw this nice gentlemen, he was diagnosed with B cell lymphoma in 2006. He underwent autologous stem cell transplant in 2007 but did require chemo afterwards to obtain remission. He is followed by Dr. Neijstrom. He has h/o chronic but stable mild anemia.   BM daily. No melena, brbpr. No abdominal pain. Some postprandial bloating. No heartburn. No dysphagia/odynophagia.   Current Outpatient Prescriptions  Medication Sig Dispense Refill  . baclofen (LIORESAL) 10 MG tablet Take 10 mg by mouth 3 (three) times daily as needed.      . ibuprofen (ADVIL,MOTRIN) 200 MG tablet Take 200 mg by mouth every 6 (six) hours as needed. Use seldom.      . potassium chloride SA (K-DUR,KLOR-CON) 20 MEQ tablet Take 20 mEq by mouth daily. Taking one a day now      . triamterene-hydrochlorothiazide (DYAZIDE) 37.5-25 MG per capsule Take 1 capsule by mouth every morning.        Allergies as of 06/13/2012 - Review Complete 06/13/2012  Allergen Reaction Noted  . Penicillins Rash 04/29/2011    Past Medical History  Diagnosis Date  . Hypertension   . Asthma   . 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 Venous insufficiency 04/29/2011  . Obesity, morbid (more than 100 lbs over ideal weight or BMI > 40)   . Edema   . Heart failure, diastolic, chronic     patient denies  . Anemia     chronic, stable  . Hyperlipidemia, mixed     Past Surgical History  Procedure Date   . Multiple tooth extractions 04/2005  . Back surgery 2006    T6 vertebrae removed/titanium placed  . Lung biopsy 6/06  . Colonoscopy 11/24/2004    Polyps in the left colon ablated/removed as described above.  Two submucosal lesions consistent with lipomas as described above not  manipulated./ Normal rectum  . Port a cath placement     Family History  Problem Relation Age of Onset  . Cancer Mother     lung  . Cancer Father     prostate  . Colon cancer Neg Hx     History   Social History  . Marital Status: Married    Spouse Name: N/A    Number of Children: 2  . Years of Education: N/A   Occupational History  . disability due to back    Social History Main Topics  . Smoking status: Former Smoker -- 0.5 packs/day for 15 years  . Smokeless tobacco: Never Used  . Alcohol Use: No  . Drug Use: No  . Sexually Active: Not on file   Other Topics Concern  . Not on file   Social History Narrative   MarriedNo regular exercise      ROS:  General: Negative for anorexia, weight loss, fever, chills, fatigue, weakness. Eyes: Negative for vision changes.  ENT: Negative for hoarseness, difficulty swallowing , nasal congestion. CV: Negative for   chest pain, angina, palpitations, dyspnea on exertion. Chronic peripheral edema.  Respiratory: Negative for dyspnea at rest, dyspnea on exertion, cough, sputum, wheezing.  GI: See history of present illness. GU:  Negative for dysuria, hematuria, urinary incontinence, urinary frequency, nocturnal urination.  MS: Negative for joint pain. chronic low back pain.  Derm: Negative for rash or itching.  Neuro: Negative for weakness, abnormal sensation, seizure, frequent headaches, memory loss, confusion.  Psych: Negative for anxiety, depression, suicidal ideation, hallucinations.  Endo: Negative for unusual weight change.  Heme: Negative for bruising or bleeding. Allergy: Negative for rash or hives.    Physical Examination:  BP 146/77   Pulse 84  Temp 98.2 F (36.8 C) (Temporal)  Ht 5' 11" (1.803 m)  Wt 302 lb (136.986 kg)  BMI 42.12 kg/m2   General: Well-nourished, well-developed in no acute distress. obese Head: Normocephalic, atraumatic.   Eyes: Conjunctiva pink, no icterus. Mouth: Oropharyngeal mucosa moist and pink , no lesions erythema or exudate. Neck: Supple without thyromegaly, masses, or lymphadenopathy.  Lungs: Clear to auscultation bilaterally.  Heart: Regular rate and rhythm, no murmurs rubs or gallops.  Abdomen: Bowel sounds are normal, nontender, nondistended, no hepatosplenomegaly or masses, no abdominal bruits or    hernia , no rebound or guarding.   Rectal: not performed Extremities: chronic venous stasis changes. 1+ lower extremity edema. No clubbing or deformities.  Neuro: Alert and oriented x 4 , grossly normal neurologically.  Skin: Warm and dry, no rash or jaundice.   Psych: Alert and cooperative, normal mood and affect.  Labs: Lab Results  Component Value Date   WBC 3.6* 05/23/2012   HGB 12.0* 05/23/2012   HCT 36.9* 05/23/2012   MCV 88.9 05/23/2012   PLT 191 05/23/2012   Lab Results  Component Value Date   CREATININE 0.99 05/23/2012   BUN 10 05/23/2012   NA 140 05/23/2012   K 3.4* 05/23/2012   CL 107 05/23/2012   CO2 24 05/23/2012   Lab Results  Component Value Date   ALT 19 05/23/2012   AST 18 05/23/2012   ALKPHOS 78 05/23/2012   BILITOT 0.3 05/23/2012     Imaging Studies: No results found.    

## 2012-06-20 NOTE — Op Note (Signed)
[facility] [address] [place], [address]   COLONOSCOPY PROCEDURE REPORT  PATIENT: [name], [name]  MR#:         [id number] BIRTHDATE: 29-Jul-1951 , 61  yrs. old GENDER: Male ENDOSCOPIST: R.  Roetta Sessions, MD FACP FACG REFERRED BY:  Artis Delay, M.D. PROCEDURE DATE:  06/20/2012 PROCEDURE:     Colonoscopy with biopsy, snare polypectomy and polyp ablation  INDICATIONS: history of colonic adenoma; surveillance examination  INFORMED CONSENT:  The risks, benefits, alternatives and imponderables including but not limited to bleeding, perforation as well as the possibility of a missed lesion have been reviewed.  The potential for biopsy, lesion removal, etc. have also been discussed.  Questions have been answered.  All parties agreeable. Please see the history and physical in the medical record for more information.  MEDICATIONS: Versed 5 mg IV and Demerol 75 mg IV in divided doses.  DESCRIPTION OF PROCEDURE:  After a digital rectal exam was performed, the     colonoscope was advanced from the anus through the rectum and colon to the area of the cecum, ileocecal valve and appendiceal orifice.  The cecum was deeply intubated.  These structures were well-seen and photographed for the record.  From the level of the cecum and ileocecal valve, the scope was slowly and cautiously withdrawn.  The mucosal surfaces were carefully surveyed utilizing scope tip deflection to facilitate fold flattening as needed.  The scope was pulled down into the rectum where a thorough examination including retroflexion was performed.     FINDINGS:  Suboptimal preparation. 6 mm pigmented polyp and 5 cm from anal verge; otherwise normal rectum. Multiple small hyperplastic diminutive polyps in the sigmoid segment. Patient had a single diminutive polyp in the base the cecum and one of the ileocecal valve; the remainder of the colonic mucosa appeared normal. 1.5 cm yellowish submucosal  nodule soft just distal to the ileocecal valve consistent with prior observation. Appears to be a lipoma.  THERAPEUTIC / DIAGNOSTIC MANEUVERS PERFORMED:  polyps at the ileocecal valve and cecum were cold biopsy/removed. One of the sigmoid polyp was cold biopsied/removed. Multiple diminutive sigmoid polyps were ablated with the tip of the hot snare loop. The rectal polyp was hot snare removed and recovered for the pathologist.  COMPLICATIONS: none  CECAL WITHDRAWAL TIME:  18 minutes  IMPRESSION:  Multiple rectal and colonic polyps treated/removed as described above  RECOMMENDATIONS: Followup on pathology   _______________________________ eSigned:  R. Roetta Sessions, MD FACP [facility] 06/20/2012 2:55 PM   CC:

## 2012-06-20 NOTE — Interval H&P Note (Signed)
History and Physical Interval Note:  06/20/2012 2:17 PM  [name]  has presented today for surgery, with the diagnosis of COLON POLYPS AND CHRONIC ANEMIA  The various methods of treatment have been discussed with the patient and family. After consideration of risks, benefits and other options for treatment, the patient has consented to  Procedure(s) (LRB) with comments: COLONOSCOPY (N/A) - 1:45 as a surgical intervention .  The patient's history has been reviewed, patient examined, no change in status, stable for surgery.  I have reviewed the patient's chart and labs.  Questions were answered to the patient's satisfaction.     Eula Listen

## 2012-06-23 ENCOUNTER — Encounter: Payer: Self-pay | Admitting: *Deleted

## 2012-06-23 ENCOUNTER — Encounter (HOSPITAL_COMMUNITY): Payer: Self-pay | Admitting: Internal Medicine

## 2012-06-23 ENCOUNTER — Encounter: Payer: Self-pay | Admitting: Internal Medicine

## 2012-06-24 ENCOUNTER — Encounter: Payer: Self-pay | Admitting: Internal Medicine

## 2012-07-20 DIAGNOSIS — C8589 Other specified types of non-Hodgkin lymphoma, extranodal and solid organ sites: Secondary | ICD-10-CM | POA: Diagnosis not present

## 2012-07-20 DIAGNOSIS — Z9481 Bone marrow transplant status: Secondary | ICD-10-CM | POA: Diagnosis not present

## 2012-07-20 DIAGNOSIS — G608 Other hereditary and idiopathic neuropathies: Secondary | ICD-10-CM | POA: Diagnosis not present

## 2012-07-27 ENCOUNTER — Telehealth (HOSPITAL_COMMUNITY): Payer: Self-pay

## 2012-07-27 NOTE — Telephone Encounter (Signed)
Spoke with Mr. Ketchum and he wants port removed and would like to go back to one of Dr. Scheryl Darter associates for removal.

## 2012-07-27 NOTE — Telephone Encounter (Signed)
Per Dr. Thornton Papas request patient notified that he can have port a cath removed.  Patient would like port removed

## 2012-07-28 ENCOUNTER — Telehealth (HOSPITAL_COMMUNITY): Payer: Self-pay | Admitting: *Deleted

## 2012-08-30 ENCOUNTER — Ambulatory Visit: Payer: Medicare Other | Admitting: Thoracic Surgery (Cardiothoracic Vascular Surgery)

## 2012-08-31 ENCOUNTER — Ambulatory Visit (INDEPENDENT_AMBULATORY_CARE_PROVIDER_SITE_OTHER): Payer: Medicare Other | Admitting: Thoracic Surgery (Cardiothoracic Vascular Surgery)

## 2012-08-31 ENCOUNTER — Encounter: Payer: Self-pay | Admitting: Thoracic Surgery (Cardiothoracic Vascular Surgery)

## 2012-08-31 VITALS — BP 149/82 | HR 88 | Resp 20 | Ht 71.0 in | Wt 301.0 lb

## 2012-08-31 DIAGNOSIS — Z9889 Other specified postprocedural states: Secondary | ICD-10-CM | POA: Diagnosis not present

## 2012-08-31 DIAGNOSIS — C8589 Other specified types of non-Hodgkin lymphoma, extranodal and solid organ sites: Secondary | ICD-10-CM | POA: Diagnosis not present

## 2012-08-31 DIAGNOSIS — Z95828 Presence of other vascular implants and grafts: Secondary | ICD-10-CM

## 2012-08-31 NOTE — Progress Notes (Signed)
PCP is FUSCO,LAWRENCE J., MD Referring Provider is Neijstrom, Eric S, MD  Chief Complaint  Patient presents with  . Lymphoma    Referral from Dr. Neijstrom for eval on the removal of Port-A-Cath     HPI: 61-year-old gentleman with a history of bone marrow transplantation for non-Hodgkin's lymphoma since consultation regarding Port-A-Cath removal.  [name] is a 61-year-old gentleman who was diagnosed with non-Hodgkin's lymphoma 2006. After initial treatment failed he underwent bone marrow transplantation following year. He has had a Port-A-Cath in since that time. He now no longer needs the port and wishes to have it removed.    Past Medical History  Diagnosis Date  . Hypertension   . Asthma   . 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 Venous insufficiency 04/29/2011  . Obesity, morbid (more than 100 lbs over ideal weight or BMI > 40)   . Edema   . Heart failure, diastolic, chronic     patient denies  . Anemia     chronic, stable  . Hyperlipidemia, mixed     Past Surgical History  Procedure Date  . Multiple tooth extractions 04/2005  . Back surgery 2006    T6 vertebrae removed/titanium placed  . Lung biopsy 6/06  . Colonoscopy 11/24/2004    Polyps in the left colon ablated/removed as described above.  Two submucosal lesions consistent with lipomas as described above not  manipulated./ Normal rectum  . Port a cath placement   . Colonoscopy 06/20/2012    Procedure: COLONOSCOPY;  Surgeon: Robert M Rourk, MD;  Location: [facility];  Service: Endoscopy;  Laterality: N/A;  1:45  . Limbal stem cell transplant     Family History  Problem Relation Age of Onset  . Cancer Mother     lung  . Cancer Father     prostate  . Colon cancer Neg Hx     Social History History  Substance Use Topics  . Smoking status: Former Smoker -- 0.5 packs/day for 15 years    Quit date: 11/11/2011  . Smokeless tobacco: Never Used  . Alcohol Use: No     Current Outpatient Prescriptions  Medication Sig Dispense Refill  . baclofen (LIORESAL) 10 MG tablet Take 10 mg by mouth 3 (three) times daily as needed.      . ibuprofen (ADVIL,MOTRIN) 200 MG tablet Take 200 mg by mouth every 6 (six) hours as needed. Use seldom.      . potassium chloride SA (K-DUR,KLOR-CON) 20 MEQ tablet Take 20 mEq by mouth daily. Taking one a day now      . triamterene-hydrochlorothiazide (DYAZIDE) 37.5-25 MG per capsule Take 1 capsule by mouth every morning.        Allergies  Allergen Reactions  . Penicillins Rash    Review of Systems  Constitutional:       Obesity  Eyes: Positive for visual disturbance.  Respiratory: Negative for chest tightness, shortness of breath and wheezing.   Cardiovascular: Positive for leg swelling. Negative for chest pain.       Poor Circulation  All other systems reviewed and are negative.    BP 149/82  Pulse 88  Resp 20  Ht 5' 11" (1.803 m)  Wt 301 lb (136.533 kg)  BMI 41.98 kg/m2  SpO2 96% Physical Exam  Vitals reviewed. Constitutional: He is oriented to person, place, and time. No distress.       Morbidly Obese  HENT:  Head: Normocephalic and atraumatic.    Eyes: EOM are normal. Pupils are equal, round, and reactive to light.  Neck: Neck supple.  Cardiovascular: Normal rate, regular rhythm and normal heart sounds.   Pulmonary/Chest: Effort normal and breath sounds normal.  Abdominal: Soft. There is no tenderness.  Musculoskeletal: He exhibits edema (3+).  Lymphadenopathy:    He has no cervical adenopathy.  Neurological: He is alert and oriented to person, place, and time. No cranial nerve deficit.  Skin: Skin is warm and dry.     Diagnostic Tests: None  Impression: 61-year-old gentleman with indwelling Port-A-Cath no longer needs the IV access. He wishes to have it removed.  I discussed with them the indications, risks, and benefits. We discussed the nature of the procedure which would be under local  anesthesia with IV sedation. He understands the risk include bleeding, infection, and retained catheter. He accepts these risks and wishes to proceed.  Plan: Port-A-Cath removal on Thursday, 09/08/2012 

## 2012-09-01 ENCOUNTER — Other Ambulatory Visit: Payer: Self-pay | Admitting: *Deleted

## 2012-09-01 DIAGNOSIS — C8590 Non-Hodgkin lymphoma, unspecified, unspecified site: Secondary | ICD-10-CM

## 2012-09-05 ENCOUNTER — Encounter (HOSPITAL_COMMUNITY): Payer: Self-pay | Admitting: Respiratory Therapy

## 2012-09-06 NOTE — Pre-Procedure Instructions (Signed)
ELIASAR HLAVATY  09/06/2012   Your procedure is scheduled on:  Thursday, January 16th   Report to [facility] at  8:30 AM.             (per your surgeon's request)  Call this number if you have problems the morning of surgery: [phone]   Remember:   Do not eat food or drink liquids after midnight. ( that includes water, coffee, tea)   Take these medicines the morning of surgery with A SIP OF WATER: Baclofen   Do not wear jewelry. Do not wear lotions, powders, or colognes. You may NOT wear deodorant.   Men may shave face and neck.   Do not bring valuables to the hospital.  Contacts, dentures or bridgework may not be worn into surgery.   Leave suitcase in the car. After surgery it may be brought to your room.  For patients admitted to the hospital, checkout time is 11:00 AM the day of  discharge.   Patients discharged the day of surgery will not be allowed to drive home and someone will need to stay with you for the first 24 hrs after              surgery.  Name and phone number of your driver:   Special Instructions: Shower using CHG 2 nights before surgery and the night before surgery.  If you shower the day of surgery use CHG.  Use special wash - you have one bottle of CHG for all showers.  You should use approximately 1/3 of the bottle for each shower.   Please read over the following fact sheets that you were given: Pain Booklet, MRSA Information and Surgical Site Infection Prevention

## 2012-09-07 ENCOUNTER — Encounter (HOSPITAL_COMMUNITY): Payer: Self-pay

## 2012-09-07 ENCOUNTER — Encounter (HOSPITAL_COMMUNITY)
Admission: RE | Admit: 2012-09-07 | Discharge: 2012-09-07 | Disposition: A | Discharge status: Home / Self Care | Payer: Medicare Other | Source: Ambulatory Visit | Attending: Cardiothoracic Surgery | Admitting: Cardiothoracic Surgery

## 2012-09-07 ENCOUNTER — Encounter (HOSPITAL_COMMUNITY)
Admission: RE | Admit: 2012-09-07 | Discharge: 2012-09-07 | Disposition: A | Discharge status: Home / Self Care | Payer: Medicare Other | Source: Ambulatory Visit | Attending: Anesthesiology | Admitting: Anesthesiology

## 2012-09-07 VITALS — BP 141/84 | HR 75 | Temp 98.2°F | Resp 20 | Ht 71.0 in | Wt 305.8 lb

## 2012-09-07 DIAGNOSIS — D649 Anemia, unspecified: Secondary | ICD-10-CM

## 2012-09-07 DIAGNOSIS — Z01810 Encounter for preprocedural cardiovascular examination: Secondary | ICD-10-CM | POA: Diagnosis not present

## 2012-09-07 DIAGNOSIS — C8590 Non-Hodgkin lymphoma, unspecified, unspecified site: Secondary | ICD-10-CM

## 2012-09-07 DIAGNOSIS — Z01811 Encounter for preprocedural respiratory examination: Secondary | ICD-10-CM | POA: Diagnosis not present

## 2012-09-07 DIAGNOSIS — Z01818 Encounter for other preprocedural examination: Secondary | ICD-10-CM | POA: Diagnosis not present

## 2012-09-07 DIAGNOSIS — K635 Polyp of colon: Secondary | ICD-10-CM

## 2012-09-07 DIAGNOSIS — Z452 Encounter for adjustment and management of vascular access device: Secondary | ICD-10-CM | POA: Diagnosis not present

## 2012-09-07 DIAGNOSIS — C8589 Other specified types of non-Hodgkin lymphoma, extranodal and solid organ sites: Secondary | ICD-10-CM | POA: Diagnosis not present

## 2012-09-07 DIAGNOSIS — Z01812 Encounter for preprocedural laboratory examination: Secondary | ICD-10-CM | POA: Diagnosis not present

## 2012-09-07 LAB — CBC
HCT: 39.7 % (ref 39.0–52.0)
MCH: 29.1 pg (ref 26.0–34.0)
MCHC: 32.7 g/dL (ref 30.0–36.0)
MCV: 88.8 fL (ref 78.0–100.0)
Platelets: 180 10*3/uL (ref 150–400)
RDW: 16.1 % — ABNORMAL HIGH (ref 11.5–15.5)
WBC: 5.2 10*3/uL (ref 4.0–10.5)

## 2012-09-07 LAB — BASIC METABOLIC PANEL
BUN: 14 mg/dL (ref 6–23)
CO2: 23 mEq/L (ref 19–32)
Chloride: 105 mEq/L (ref 96–112)
Glucose, Bld: 138 mg/dL — ABNORMAL HIGH (ref 70–99)
Potassium: 4 mEq/L (ref 3.5–5.1)
Sodium: 140 mEq/L (ref 135–145)

## 2012-09-07 MED ORDER — SODIUM CHLORIDE 0.45 % IV SOLN: INTRAVENOUS | Status: DC

## 2012-09-07 NOTE — Progress Notes (Addendum)
71  SPOKE WITH ONE OF THE GIRLS AT THE OFFICE CONCERNING THE ARRIVAL TIME FOR MR. Scarbrough.. SURGERY ISN'T UNTIL 15:30 ON THE 17TH....TOLD HIM TO ARRIVE AT 13:30PM ( HE LIVES IN [place], HAS TO GET KIDS OFF TO SCHOOL, ETC) THE OFFICE SAID TO LEAVE AS IS UNLESS I HEAR DIFFERENT FROM THEM.......DA ALSO REQUESTED OLD EKG FROM [facility] IN [place].......Marland KitchenDA  GETTING NEW LABELS AS SURGERY IS TO BE DONE BY DR. HENDRICKSON.Marland KitchenDA

## 2012-09-07 NOTE — Progress Notes (Signed)
09/07/12 1012  OBSTRUCTIVE SLEEP APNEA  Score 4 or greater  Results sent to PCP

## 2012-09-08 ENCOUNTER — Encounter (HOSPITAL_COMMUNITY)
Admission: RE | Disposition: A | Discharge status: Home / Self Care | Payer: Self-pay | Source: Ambulatory Visit | Attending: Thoracic Surgery (Cardiothoracic Vascular Surgery)

## 2012-09-08 ENCOUNTER — Ambulatory Visit (HOSPITAL_COMMUNITY)
Admission: RE | Admit: 2012-09-08 | Discharge: 2012-09-08 | Disposition: A | Discharge status: Home / Self Care | Payer: Medicare Other | Source: Ambulatory Visit | Attending: Thoracic Surgery (Cardiothoracic Vascular Surgery) | Admitting: Thoracic Surgery (Cardiothoracic Vascular Surgery)

## 2012-09-08 ENCOUNTER — Encounter (HOSPITAL_COMMUNITY): Payer: Self-pay | Admitting: Anesthesiology

## 2012-09-08 ENCOUNTER — Encounter (HOSPITAL_COMMUNITY): Payer: Self-pay | Admitting: *Deleted

## 2012-09-08 ENCOUNTER — Ambulatory Visit (HOSPITAL_COMMUNITY): Payer: Medicare Other | Admitting: Certified Registered"

## 2012-09-08 ENCOUNTER — Encounter (HOSPITAL_COMMUNITY): Payer: Self-pay | Admitting: Certified Registered"

## 2012-09-08 DIAGNOSIS — Z01818 Encounter for other preprocedural examination: Secondary | ICD-10-CM | POA: Insufficient documentation

## 2012-09-08 DIAGNOSIS — Z01812 Encounter for preprocedural laboratory examination: Secondary | ICD-10-CM | POA: Insufficient documentation

## 2012-09-08 DIAGNOSIS — Z452 Encounter for adjustment and management of vascular access device: Secondary | ICD-10-CM | POA: Diagnosis not present

## 2012-09-08 DIAGNOSIS — C8589 Other specified types of non-Hodgkin lymphoma, extranodal and solid organ sites: Secondary | ICD-10-CM | POA: Insufficient documentation

## 2012-09-08 DIAGNOSIS — C8599 Non-Hodgkin lymphoma, unspecified, extranodal and solid organ sites: Secondary | ICD-10-CM | POA: Diagnosis not present

## 2012-09-08 DIAGNOSIS — Z01810 Encounter for preprocedural cardiovascular examination: Secondary | ICD-10-CM | POA: Insufficient documentation

## 2012-09-08 DIAGNOSIS — C8590 Non-Hodgkin lymphoma, unspecified, unspecified site: Secondary | ICD-10-CM

## 2012-09-08 DIAGNOSIS — C3490 Malignant neoplasm of unspecified part of unspecified bronchus or lung: Secondary | ICD-10-CM | POA: Diagnosis not present

## 2012-09-08 HISTORY — PX: PORT-A-CATH REMOVAL: SHX5289

## 2012-09-08 SURGERY — REMOVAL PORT-A-CATH
Anesthesia: General | Site: Chest | Wound class: Clean

## 2012-09-08 MED ORDER — FENTANYL CITRATE 0.05 MG/ML IJ SOLN
INTRAMUSCULAR | Status: DC | PRN
  Administered 2012-09-08: 50 ug via INTRAVENOUS

## 2012-09-08 MED ORDER — PROPOFOL INFUSION 10 MG/ML OPTIME
INTRAVENOUS | Status: DC | PRN
  Administered 2012-09-08: 100 ug/kg/min via INTRAVENOUS

## 2012-09-08 MED ORDER — LACTATED RINGERS IV SOLN
INTRAVENOUS | Status: DC | PRN
  Administered 2012-09-08: 14:00:00 via INTRAVENOUS

## 2012-09-08 MED ORDER — OXYCODONE HCL 5 MG PO TABS: 5.0000 mg | ORAL_TABLET | Freq: Four times a day (QID) | ORAL | Status: DC | PRN

## 2012-09-08 MED ORDER — ACETAMINOPHEN 650 MG RE SUPP: 650.0000 mg | RECTAL | Status: DC | PRN

## 2012-09-08 MED ORDER — ACETAMINOPHEN 325 MG PO TABS: 650.0000 mg | ORAL_TABLET | ORAL | Status: DC | PRN

## 2012-09-08 MED ORDER — SODIUM CHLORIDE 0.9 % IJ SOLN: 3.0000 mL | Freq: Two times a day (BID) | INTRAMUSCULAR | Status: DC

## 2012-09-08 MED ORDER — LIDOCAINE-EPINEPHRINE (PF) 1 %-1:200000 IJ SOLN
INTRAMUSCULAR | Status: DC | PRN
  Administered 2012-09-08: 17 mL

## 2012-09-08 MED ORDER — SODIUM CHLORIDE 0.9 % IV SOLN: 250.0000 mL | INTRAVENOUS | Status: DC | PRN

## 2012-09-08 MED ORDER — LACTATED RINGERS IV SOLN
INTRAVENOUS | Status: DC
  Administered 2012-09-08: 14:00:00 via INTRAVENOUS

## 2012-09-08 MED ORDER — MIDAZOLAM HCL 5 MG/5ML IJ SOLN
INTRAMUSCULAR | Status: DC | PRN
  Administered 2012-09-08: 2 mg via INTRAVENOUS

## 2012-09-08 MED ORDER — ONDANSETRON HCL 4 MG/2ML IJ SOLN
INTRAMUSCULAR | Status: DC | PRN
  Administered 2012-09-08: 4 mg via INTRAVENOUS

## 2012-09-08 MED ORDER — LIDOCAINE-EPINEPHRINE (PF) 1 %-1:200000 IJ SOLN
INTRAMUSCULAR | Status: AC
  Filled 2012-09-08: qty 10

## 2012-09-08 MED ORDER — VANCOMYCIN HCL IN DEXTROSE 1-5 GM/200ML-% IV SOLN
INTRAVENOUS | Status: AC
  Filled 2012-09-08: qty 200

## 2012-09-08 MED ORDER — 0.9 % SODIUM CHLORIDE (POUR BTL) OPTIME
TOPICAL | Status: DC | PRN
  Administered 2012-09-08: 1000 mL

## 2012-09-08 MED ORDER — ONDANSETRON HCL 4 MG/2ML IJ SOLN: 4.0000 mg | Freq: Four times a day (QID) | INTRAMUSCULAR | Status: DC | PRN

## 2012-09-08 MED ORDER — VANCOMYCIN HCL 1000 MG IV SOLR
1000.0000 mg | INTRAVENOUS | Status: DC | PRN
  Administered 2012-09-08: 1000 mg via INTRAVENOUS

## 2012-09-08 MED ORDER — SODIUM CHLORIDE 0.9 % IJ SOLN: 3.0000 mL | INTRAMUSCULAR | Status: DC | PRN

## 2012-09-08 SURGICAL SUPPLY — 31 items
BLADE SURG 11 STRL SS (BLADE) ×2 IMPLANT
CANISTER SUCTION 2500CC (MISCELLANEOUS) ×2 IMPLANT
CLOTH BEACON ORANGE TIMEOUT ST (SAFETY) ×2 IMPLANT
COVER SURGICAL LIGHT HANDLE (MISCELLANEOUS) ×4 IMPLANT
DERMABOND ADVANCED (GAUZE/BANDAGES/DRESSINGS) ×1
DERMABOND ADVANCED .7 DNX12 (GAUZE/BANDAGES/DRESSINGS) ×1 IMPLANT
DRAPE LAPAROTOMY T 102X78X121 (DRAPES) ×2 IMPLANT
ELECT REM PT RETURN 9FT ADLT (ELECTROSURGICAL) ×2
ELECTRODE REM PT RTRN 9FT ADLT (ELECTROSURGICAL) ×1 IMPLANT
GLOVE BIOGEL PI IND STRL 6.5 (GLOVE) ×2 IMPLANT
GLOVE BIOGEL PI IND STRL 7.5 (GLOVE) ×1 IMPLANT
GLOVE BIOGEL PI INDICATOR 6.5 (GLOVE) ×2
GLOVE BIOGEL PI INDICATOR 7.5 (GLOVE) ×1
GLOVE EUDERMIC 7 POWDERFREE (GLOVE) ×2 IMPLANT
GLOVE SURG SS PI 7.5 STRL IVOR (GLOVE) ×2 IMPLANT
GOWN PREVENTION PLUS XLARGE (GOWN DISPOSABLE) ×4 IMPLANT
GOWN STRL NON-REIN LRG LVL3 (GOWN DISPOSABLE) ×4 IMPLANT
KIT BASIN OR (CUSTOM PROCEDURE TRAY) ×2 IMPLANT
KIT ROOM TURNOVER OR (KITS) ×2 IMPLANT
NEEDLE 22X1 1/2 (OR ONLY) (NEEDLE) ×2 IMPLANT
NS IRRIG 1000ML POUR BTL (IV SOLUTION) ×2 IMPLANT
PACK GENERAL/GYN (CUSTOM PROCEDURE TRAY) ×2 IMPLANT
PAD ARMBOARD 7.5X6 YLW CONV (MISCELLANEOUS) ×4 IMPLANT
SPONGE GAUZE 4X4 12PLY (GAUZE/BANDAGES/DRESSINGS) ×2 IMPLANT
SUT VIC AB 3-0 SH 27 (SUTURE) ×1
SUT VIC AB 3-0 SH 27X BRD (SUTURE) ×1 IMPLANT
SUT VIC AB 4-0 PS2 27 (SUTURE) ×2 IMPLANT
SYR CONTROL 10ML LL (SYRINGE) ×2 IMPLANT
TOWEL OR 17X24 6PK STRL BLUE (TOWEL DISPOSABLE) ×2 IMPLANT
TOWEL OR 17X26 10 PK STRL BLUE (TOWEL DISPOSABLE) ×2 IMPLANT
WATER STERILE IRR 1000ML POUR (IV SOLUTION) ×2 IMPLANT

## 2012-09-08 NOTE — Brief Op Note (Signed)
09/08/2012  2:46 PM  PATIENT:  Jeffie Pollock  62 y.o. male  PRE-OPERATIVE DIAGNOSIS:  Lymphoma- in remission  POST-OPERATIVE DIAGNOSIS:  Lymphoma- in remission   PROCEDURE:  Procedure(s) (LRB) with comments: REMOVAL PORT-A-CATH (N/A)  SURGEON:  Surgeon(s) and Role:    * Loreli Slot, MD - Primary   ANESTHESIA:   general  EBL:  Total I/O In: 250 [I.V.:250] Out: 5 [Blood:5]   LOCAL MEDICATIONS USED:  LIDOCAINE  and Amount: 17 ml   PLAN OF CARE: Discharge to home after PACU  PATIENT DISPOSITION:  PACU - hemodynamically stable.   Delay start of Pharmacological VTE agent (>24hrs) due to surgical blood loss or risk of bleeding: not applicable

## 2012-09-08 NOTE — Interval H&P Note (Signed)
History and Physical Interval Note:  09/08/2012 1:52 PM  [name]  has presented today for surgery, with the diagnosis of LUNG CA  The various methods of treatment have been discussed with the patient and family. After consideration of risks, benefits and other options for treatment, the patient has consented to  Procedure(s) (LRB) with comments: REMOVAL PORT-A-CATH (N/A) as a surgical intervention .  The patient's history has been reviewed, patient examined, no change in status, stable for surgery.  I have reviewed the patient's chart and labs.  Questions were answered to the patient's satisfaction.     Teea Ducey C

## 2012-09-08 NOTE — Anesthesia Postprocedure Evaluation (Signed)
  Anesthesia Post-op Note  Patient: [name]  Procedure(s) Performed: Procedure(s) (LRB) with comments: REMOVAL PORT-A-CATH (N/A)  Patient Location: PACU  Anesthesia Type:General  Level of Consciousness: awake  Airway and Oxygen Therapy: Patient Spontanous Breathing  Post-op Pain: mild  Post-op Assessment: Post-op Vital signs reviewed  Post-op Vital Signs: Reviewed  Complications: No apparent anesthesia complications

## 2012-09-08 NOTE — Anesthesia Preprocedure Evaluation (Signed)
Anesthesia Evaluation  Patient identified by MRN, date of birth, ID band Patient awake    Reviewed: Allergy & Precautions, H&P , NPO status , Patient's Chart, lab work & pertinent test results, reviewed documented beta blocker date and time   Airway Mallampati: II TM Distance: >3 FB     Dental  (+) Teeth Intact   Pulmonary asthma ,          Cardiovascular hypertension, Pt. on medications + Peripheral Vascular Disease     Neuro/Psych    GI/Hepatic   Endo/Other  Morbid obesity  Renal/GU      Musculoskeletal   Abdominal   Peds  Hematology   Anesthesia Other Findings   Reproductive/Obstetrics                           Anesthesia Physical Anesthesia Plan  ASA: III  Anesthesia Plan: MAC   Post-op Pain Management:    Induction: Intravenous  Airway Management Planned: Mask  Additional Equipment:   Intra-op Plan:   Post-operative Plan:   Informed Consent: I have reviewed the patients History and Physical, chart, labs and discussed the procedure including the risks, benefits and alternatives for the proposed anesthesia with the patient or authorized representative who has indicated his/her understanding and acceptance.     Plan Discussed with:   Anesthesia Plan Comments:         Anesthesia Quick Evaluation

## 2012-09-08 NOTE — Preoperative (Signed)
Beta Blockers   Reason not to administer Beta Blockers:Not Applicable 

## 2012-09-08 NOTE — Anesthesia Procedure Notes (Signed)
Procedure Name: MAC Date/Time: 09/08/2012 2:08 PM Performed by: Jerilee Hoh Pre-anesthesia Checklist: Patient identified, Emergency Drugs available, Suction available and Patient being monitored Patient Re-evaluated:Patient Re-evaluated prior to inductionOxygen Delivery Method: Simple face mask Intubation Type: IV induction Placement Confirmation: positive ETCO2 and breath sounds checked- equal and bilateral

## 2012-09-08 NOTE — H&P (View-Only) (Signed)
PCP is Cassell Smiles., MD Referring Provider is Randall An, MD  Chief Complaint  Patient presents with  . Lymphoma    Referral from Dr. Mariel Sleet for eval on the removal of Port-A-Cath     HPI: 62 year old gentleman with a history of bone marrow transplantation for non-Hodgkin's lymphoma since consultation regarding Port-A-Cath removal.  [name] is a 62 year old gentleman who was diagnosed with non-Hodgkin's lymphoma 2006. After initial treatment failed he underwent bone marrow transplantation following year. He has had a Port-A-Cath in since that time. He now no longer needs the port and wishes to have it removed.    Past Medical History  Diagnosis Date  . Hypertension   . Asthma   . 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 Venous insufficiency 04/29/2011  . Obesity, morbid (more than 100 lbs over ideal weight or BMI > 40)   . Edema   . Heart failure, diastolic, chronic     patient denies  . Anemia     chronic, stable  . Hyperlipidemia, mixed     Past Surgical History  Procedure Date  . Multiple tooth extractions 04/2005  . Back surgery 2006    T6 vertebrae removed/titanium placed  . Lung biopsy 6/06  . Colonoscopy 11/24/2004    Polyps in the left colon ablated/removed as described above.  Two submucosal lesions consistent with lipomas as described above not  manipulated./ Normal rectum  . Port a cath placement   . Colonoscopy 06/20/2012    Procedure: COLONOSCOPY;  Surgeon: Corbin Ade, MD;  [facility]: [facility];  Service: Endoscopy;  Laterality: N/A;  1:45  . Limbal stem cell transplant     Family History  Problem Relation Age of Onset  . Cancer Mother     lung  . Cancer Father     prostate  . Colon cancer Neg Hx     Social History History  Substance Use Topics  . Smoking status: Former Smoker -- 0.5 packs/day for 15 years    Quit date: 11/11/2011  . Smokeless tobacco: Never Used  . Alcohol Use: No     Current Outpatient Prescriptions  Medication Sig Dispense Refill  . baclofen (LIORESAL) 10 MG tablet Take 10 mg by mouth 3 (three) times daily as needed.      Marland Kitchen ibuprofen (ADVIL,MOTRIN) 200 MG tablet Take 200 mg by mouth every 6 (six) hours as needed. Use seldom.      . potassium chloride SA (K-DUR,KLOR-CON) 20 MEQ tablet Take 20 mEq by mouth daily. Taking one a day now      . triamterene-hydrochlorothiazide (DYAZIDE) 37.5-25 MG per capsule Take 1 capsule by mouth every morning.        Allergies  Allergen Reactions  . Penicillins Rash    Review of Systems  Constitutional:       Obesity  Eyes: Positive for visual disturbance.  Respiratory: Negative for chest tightness, shortness of breath and wheezing.   Cardiovascular: Positive for leg swelling. Negative for chest pain.       Poor Circulation  All other systems reviewed and are negative.    BP 149/82  Pulse 88  Resp 20  Ht 5\' 11"  (1.803 m)  Wt 301 lb (136.533 kg)  BMI 41.98 kg/m2  SpO2 96% Physical Exam  Vitals reviewed. Constitutional: He is oriented to person, place, and time. No distress.       Morbidly Obese  HENT:  Head: Normocephalic and atraumatic.  Eyes: EOM are normal. Pupils are equal, round, and reactive to light.  Neck: Neck supple.  Cardiovascular: Normal rate, regular rhythm and normal heart sounds.   Pulmonary/Chest: Effort normal and breath sounds normal.  Abdominal: Soft. There is no tenderness.  Musculoskeletal: He exhibits edema (3+).  Lymphadenopathy:    He has no cervical adenopathy.  Neurological: He is alert and oriented to person, place, and time. No cranial nerve deficit.  Skin: Skin is warm and dry.     Diagnostic Tests: None  Impression: 62 year old gentleman with indwelling Port-A-Cath no longer needs the IV access. He wishes to have it removed.  I discussed with them the indications, risks, and benefits. We discussed the nature of the procedure which would be under local  anesthesia with IV sedation. He understands the risk include bleeding, infection, and retained catheter. He accepts these risks and wishes to proceed.  Plan: Port-A-Cath removal on Thursday, 09/08/2012

## 2012-09-08 NOTE — Transfer of Care (Signed)
Immediate Anesthesia Transfer of Care Note  Patient: [name]  Procedure(s) Performed: Procedure(s) (LRB) with comments: REMOVAL PORT-A-CATH (N/A)  Patient Location: PACU  Anesthesia Type:MAC  Level of Consciousness: awake, alert , oriented and patient cooperative  Airway & Oxygen Therapy: Patient Spontanous Breathing  Post-op Assessment: Report given to PACU RN, Post -op Vital signs reviewed and stable and Patient moving all extremities  Post vital signs: Reviewed and stable  Complications: No apparent anesthesia complications

## 2012-09-09 ENCOUNTER — Encounter (HOSPITAL_COMMUNITY): Payer: Self-pay | Admitting: Thoracic Surgery (Cardiothoracic Vascular Surgery)

## 2012-09-09 NOTE — Op Note (Signed)
[name], EKHOLM NO.:  [id number]  MEDICAL RECORD NO.:  [id number]  LOCATION:  [facility]                         FACILITY:  [facility]  PHYSICIAN:  Salvatore Decent. Dorris Fetch, M.D.DATE OF BIRTH:  11/20/1950  DATE OF PROCEDURE:  09/08/2012 DATE OF DISCHARGE:  09/08/2012                              OPERATIVE REPORT   PREOPERATIVE DIAGNOSIS:  Lymphoma in remission.  No longer needs Port-A- Cath.  POSTOPERATIVE DIAGNOSIS:  Lymphoma in remission.  No longer needs Port-A- Cath.  PROCEDURE:  Port-A-Cath removal.  SURGEON:  Salvatore Decent. Dorris Fetch, MD  ANESTHESIA:  Local with intravenous sedation.  FINDINGS:  Port-A-Cath removed intact.  Majority of capsule removed.  CLINICAL NOTE:  Mr. Cuevas is a 62 year old gentleman who had a bone marrow transplant for non-Hodgkin's lymphoma which is in remission and he wishes to have the Port-A-Cath removed.  The indications, risks, benefits, and alternatives were discussed in detail with the patient and are outlined in his office consultation note.  He understood and accepted the risks and agreed to proceed.  OPERATIVE NOTE:  Mr. Littler was brought to the operating room on August 29, 2012. Anesthesia administered intravenous vancomycin, and gave the patient sedation, and monitored the patient during the procedure.  The chest was prepped and draped in the usual sterile fashion.  Lidocaine 1% was used to anesthetize the previous incision and the area around the port itself.  The patient did experience some discomfort with initial injection of local but tolerated the remainder of the procedure without difficulty.  After assessing and ensuring that there was adequate local anesthetic effect, an incision was made through the previous scar and was carried through the skin and subcutaneous tissue.  Hemostasis was achieved with electrocautery.  The subcutaneous tissue was divided down to the port and the capsule was entered.  The catheter was  identified and dissected out and removed intact without resistance.  The capsule then was incised and the Port also came out without difficulty.  The capsule then was excised.  A small portion of the very inferior aspect of the capsule was difficult to access and would have required excessive traction or lengthening the incision.  This portion of the capsule was cauterized.  The wound then was closed in 2 layers with a running 3-0 Vicryl subcutaneous suture and a 4-0 Vicryl subcuticular suture. Dermabond was applied to the incision. The patient was taken from the operating room to the postanesthetic care unit in good condition.     Salvatore Decent Dorris Fetch, M.D.     SCH/MEDQ  D:  09/08/2012  T:  09/08/2012  Job:  [id number]

## 2012-12-01 ENCOUNTER — Other Ambulatory Visit (HOSPITAL_COMMUNITY): Payer: Self-pay | Admitting: Physician Assistant

## 2012-12-01 ENCOUNTER — Ambulatory Visit (HOSPITAL_COMMUNITY)
Admission: RE | Admit: 2012-12-01 | Discharge: 2012-12-01 | Disposition: A | Discharge status: Home / Self Care | Payer: Medicare Other | Source: Ambulatory Visit | Attending: Physician Assistant | Admitting: Physician Assistant

## 2012-12-01 DIAGNOSIS — R55 Syncope and collapse: Secondary | ICD-10-CM | POA: Diagnosis not present

## 2012-12-01 DIAGNOSIS — Z681 Body mass index (BMI) 19 or less, adult: Secondary | ICD-10-CM | POA: Diagnosis not present

## 2012-12-01 DIAGNOSIS — R0602 Shortness of breath: Secondary | ICD-10-CM | POA: Diagnosis not present

## 2012-12-01 DIAGNOSIS — I2510 Atherosclerotic heart disease of native coronary artery without angina pectoris: Secondary | ICD-10-CM

## 2012-12-01 DIAGNOSIS — J019 Acute sinusitis, unspecified: Secondary | ICD-10-CM | POA: Diagnosis not present

## 2012-12-01 DIAGNOSIS — R091 Pleurisy: Secondary | ICD-10-CM | POA: Diagnosis not present

## 2012-12-02 DIAGNOSIS — E669 Obesity, unspecified: Secondary | ICD-10-CM | POA: Diagnosis not present

## 2012-12-02 DIAGNOSIS — D539 Nutritional anemia, unspecified: Secondary | ICD-10-CM | POA: Diagnosis not present

## 2012-12-02 DIAGNOSIS — I2510 Atherosclerotic heart disease of native coronary artery without angina pectoris: Secondary | ICD-10-CM | POA: Diagnosis not present

## 2012-12-02 DIAGNOSIS — J019 Acute sinusitis, unspecified: Secondary | ICD-10-CM | POA: Diagnosis not present

## 2012-12-02 DIAGNOSIS — R55 Syncope and collapse: Secondary | ICD-10-CM | POA: Diagnosis not present

## 2012-12-15 DIAGNOSIS — E669 Obesity, unspecified: Secondary | ICD-10-CM | POA: Diagnosis not present

## 2012-12-15 DIAGNOSIS — Z0000 Encounter for general adult medical examination without abnormal findings: Secondary | ICD-10-CM | POA: Diagnosis not present

## 2012-12-15 DIAGNOSIS — E785 Hyperlipidemia, unspecified: Secondary | ICD-10-CM | POA: Diagnosis not present

## 2012-12-15 DIAGNOSIS — R7309 Other abnormal glucose: Secondary | ICD-10-CM | POA: Diagnosis not present

## 2012-12-15 DIAGNOSIS — Z6841 Body Mass Index (BMI) 40.0 and over, adult: Secondary | ICD-10-CM | POA: Diagnosis not present

## 2013-01-06 ENCOUNTER — Encounter (HOSPITAL_COMMUNITY): Payer: Medicare Other | Attending: Oncology

## 2013-01-06 DIAGNOSIS — I10 Essential (primary) hypertension: Secondary | ICD-10-CM | POA: Insufficient documentation

## 2013-01-06 DIAGNOSIS — C8589 Other specified types of non-Hodgkin lymphoma, extranodal and solid organ sites: Secondary | ICD-10-CM | POA: Insufficient documentation

## 2013-01-06 DIAGNOSIS — C8330 Diffuse large B-cell lymphoma, unspecified site: Secondary | ICD-10-CM

## 2013-01-06 LAB — COMPREHENSIVE METABOLIC PANEL
Alkaline Phosphatase: 81 U/L (ref 39–117)
BUN: 10 mg/dL (ref 6–23)
Chloride: 106 mEq/L (ref 96–112)
GFR calc Af Amer: 86 mL/min — ABNORMAL LOW (ref >90)
GFR calc non Af Amer: 74 mL/min — ABNORMAL LOW (ref >90)
Glucose, Bld: 100 mg/dL — ABNORMAL HIGH (ref 70–99)
Potassium: 4.3 mEq/L (ref 3.5–5.1)
Total Bilirubin: 0.2 mg/dL — ABNORMAL LOW (ref 0.3–1.2)
Total Protein: 6.7 g/dL (ref 6.0–8.3)

## 2013-01-06 LAB — LACTATE DEHYDROGENASE: LDH: 225 U/L (ref 94–250)

## 2013-01-06 LAB — CBC WITH DIFFERENTIAL/PLATELET
Eosinophils Absolute: 0.2 10*3/uL (ref 0.0–0.7)
Hemoglobin: 12.1 g/dL — ABNORMAL LOW (ref 13.0–17.0)
Lymphs Abs: 1.8 10*3/uL (ref 0.7–4.0)
MCH: 29.7 pg (ref 26.0–34.0)
MCV: 90.9 fL (ref 78.0–100.0)
Monocytes Relative: 12 % (ref 3–12)
Neutrophils Relative %: 46 % (ref 43–77)
RBC: 4.07 MIL/uL — ABNORMAL LOW (ref 4.22–5.81)

## 2013-01-06 NOTE — Progress Notes (Signed)
Labs drawn today for cbc/diff,cmp,ldh,sed rate 

## 2013-01-11 ENCOUNTER — Encounter (HOSPITAL_COMMUNITY): Payer: Self-pay

## 2013-01-11 ENCOUNTER — Encounter (HOSPITAL_BASED_OUTPATIENT_CLINIC_OR_DEPARTMENT_OTHER): Payer: Medicare Other

## 2013-01-11 VITALS — BP 146/82 | HR 81 | Temp 98.0°F | Resp 22 | Wt 305.7 lb

## 2013-01-11 DIAGNOSIS — C8589 Other specified types of non-Hodgkin lymphoma, extranodal and solid organ sites: Secondary | ICD-10-CM

## 2013-01-11 DIAGNOSIS — I890 Lymphedema, not elsewhere classified: Secondary | ICD-10-CM | POA: Diagnosis not present

## 2013-01-11 DIAGNOSIS — C8330 Diffuse large B-cell lymphoma, unspecified site: Secondary | ICD-10-CM

## 2013-01-11 NOTE — Patient Instructions (Addendum)
[facility] Discharge Instructions  RECOMMENDATIONS MADE BY THE CONSULTANT AND ANY TEST RESULTS WILL BE SENT TO YOUR REFERRING PHYSICIAN.  EXAM FINDINGS BY THE PHYSICIAN TODAY AND SIGNS OR SYMPTOMS TO REPORT TO CLINIC OR PRIMARY PHYSICIAN: Exam and discussion by Dr. Sharia Reeve.  You are doing well.  Blood counts are stable.  MEDICATIONS PRESCRIBED:  none  INSTRUCTIONS GIVEN AND DISCUSSED: Report night sweats, recurring infections any new lumps, etc.  SPECIAL INSTRUCTIONS/FOLLOW-UP: Referral to PT for evaluation of lower extremity edema. Blood work and follow-up in 6 months.   Thank you for choosing [facility] to provide your oncology and hematology care.  To afford each patient quality time with our providers, please arrive at least 15 minutes before your scheduled appointment time.  With your help, our goal is to use those 15 minutes to complete the necessary work-up to ensure our physicians have the information they need to help with your evaluation and healthcare recommendations.    Effective January 1st, 2014, we ask that you re-schedule your appointment with our physicians should you arrive 10 or more minutes late for your appointment.  We strive to give you quality time with our providers, and arriving late affects you and other patients whose appointments are after yours.    Again, thank you for choosing [facility].  Our hope is that these requests will decrease the amount of time that you wait before being seen by our physicians.       _____________________________________________________________  Should you have questions after your visit to [facility], please contact our office at [phone] between the hours of 8:30 a.m. and 5:00 p.m.  Voicemails left after 4:30 p.m. will not be returned until the following business day.  For prescription refill requests, have your pharmacy contact our office with your prescription  refill request.

## 2013-01-11 NOTE — Progress Notes (Signed)
[facility] Telephone:([phone]) [phone]   Fax:([phone]  OFFICE PROGRESS NOTE  [name]., MD [address] [place] [address]  DIAGNOSIS: Diffuse large B-cell lymphoma  Oncologic History: Per [name] Note; Diffuse large B-cell lymphoma presenting with extensive stage IV a disease including thoracic vertebral body involvement with biopsy on 02/04/2005. He had surgical stabilization of his thoracic spine followed by chemotherapy with R. CHOP. He went on to have ICE chemotherapy pre-transplant followed by total body irradiation followed by stem cell transplant on 01/15/2006. This was an autologous stem cell transplant. We then maintain him on rituximab for 2 years. He did not get a complete remission with the R. CHOP chemotherapy initially. His last PET scan was in November 2011.   INTERVAL HISTORY: [name] 62 y.o. male returns to the clinic today for schedule 6 monthly follow up. He tells me he feels well . He does not have any fever or chills. He also denies lymphadenopathy or unintended weight loss. He denies night sweats. He reports that he has chronic edema of both lower extremities which is on and off.It appears he had total body irradiation prior to stem cell transplant.He otherwise does not report any new complaints today.  MEDICAL HISTORY: Past Medical History  Diagnosis Date  . Hypertension   . Asthma   . 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 Venous insufficiency 04/29/2011  . Obesity, morbid (more than 100 lbs over ideal weight or BMI > 40)   . Edema   . Heart failure, diastolic, chronic     patient denies  . Hyperlipidemia, mixed     ALLERGIES:  is allergic to penicillins.  MEDICATIONS:  Current Outpatient Prescriptions  Medication Sig Dispense Refill  . acetaminophen (TYLENOL) 500 MG tablet Take 500 mg by mouth every 6 (six) hours as needed for pain.      . baclofen  (LIORESAL) 10 MG tablet Take 10 mg by mouth 3 (three) times daily as needed. For hand cramps      . ibuprofen (ADVIL,MOTRIN) 200 MG tablet Take 200 mg by mouth every 6 (six) hours as needed. Use seldom. For pain      . triamterene-hydrochlorothiazide (DYAZIDE) 37.5-25 MG per capsule Take 1 capsule by mouth daily.       . potassium chloride SA (K-DUR,KLOR-CON) 20 MEQ tablet Take 20 mEq by mouth daily.        No current facility-administered medications for this visit.    SURGICAL HISTORY:  Past Surgical History  Procedure Laterality Date  . Multiple tooth extractions  04/2005  . Back surgery  2006    T6 vertebrae removed/titanium placed  . Lung biopsy  6/06  . Colonoscopy  11/24/2004    Polyps in the left colon ablated/removed as described above.  Two submucosal lesions consistent with lipomas as described above not  manipulated./ Normal rectum  . Port a cath placement    . Colonoscopy  06/20/2012    Procedure: COLONOSCOPY;  Surgeon: Corbin Ade, MD;  Location[facility];  Service: Endoscopy;  Laterality: N/A;  1:45  . Limbal stem cell transplant    . Port-a-cath removal  09/08/2012    Procedure: REMOVAL PORT-A-CATH;  Surgeon: Loreli Slot, MD;  Location: [facility] OR;  Service: Thoracic;  Laterality: N/A;    REVIEW OF SYSTEMS:  14 point review of system is as in the history above otherwise negative.  PHYSICAL EXAMINATION:   Blood pressure 185/94, pulse 94, temperature 98 F (36.7 C), temperature source Oral, resp. rate 22, weight 305 lb 11.2 oz (138.665 kg). GENERAL: No distress, Severely obese.   SKIN:  No rashes or significant lesions  HEAD: Normocephalic, No masses, lesions, tenderness or abnormalities  EYES: Conjunctiva are pink and non-injected  ENT: External ears normal ,lips, buccal mucosa, and tongue normal and mucous membranes are moist  LYMPH: No palpable lymphadenopathy in the neck,supraclavicular, axillary or inguinal is lymph node. LUNGS: clear to auscultation  , no crackles or wheezes HEART: regular rate & rhythm, no murmurs, no gallops, S1 normal and S2 normal  ABDOMEN: Abdomen soft, non-tender, normal bowel sounds, no masses or organomegaly and no hepatosplenomegaly palpable. EXTREMITIES: Lymphedema extremities with chronic stasis changes. NEURO: alert & oriented , no focal motor/sensory deficits.    LABORATORY DATA: Lab Results  Component Value Date   WBC 4.8 01/06/2013   HGB 12.1* 01/06/2013   HCT 37.0* 01/06/2013   MCV 90.9 01/06/2013   PLT 210 01/06/2013      Chemistry      Component Value Date/Time   NA 139 01/06/2013 0953   K 4.3 01/06/2013 0953   CL 106 01/06/2013 0953   CO2 23 01/06/2013 0953   BUN 10 01/06/2013 0953   CREATININE 1.06 01/06/2013 0953      Component Value Date/Time   CALCIUM 9.0 01/06/2013 0953   ALKPHOS 81 01/06/2013 0953   AST 17 01/06/2013 0953   ALT 16 01/06/2013 0953   BILITOT 0.2* 01/06/2013 0953       RADIOGRAPHIC STUDIES: No results found.   ASSESSMENT: Mr. [name] has a history of  diffuse large B-cell lymphoma and was treated as double read that RCHOP chemotherapy followed salvage chemotherapy with ICE. This was a then followed by an total body irradiation and  autologous stem cell transplant approximately 7 years ago.Since then she has no evidence of disease. He appears to have chronic lymphedema with chronic stasis changes his lower extremities, this may be related to the radiation off the lower lymph nodes in the iliac and groin areas.He is currently not seeing PT for this. He has a mild anemia but his blood counts  are stable.Overall he has no evidence of disease recurrence.  PLAN:  1. I referred him to physical therapy for lymphedema therapy. 2. I reassured him there is no evidence of disease recurrence at this time. He told him that it is unusual for lymphoma to recurr the late. However he stands at the risk of developing on the malignancies especially considering total body radiation ,autologous stem  cell transplant and prior chemotherapies. 3. He will return to clinic in 6 months. Repeat CBC CMP and LDH has been ordered.     All questions were answered. The patient knows to call the clinic with any problems, questions or concerns. We can certainly see the patient much sooner if necessary.  I spent 15 minutes counseling the patient face to face. The total time spent in the appointment was 30 minutes.   Sherral Hammers, MD FACP. Hematology/Oncology.

## 2013-07-13 NOTE — Progress Notes (Signed)
[name]., MD [address] [place] [address]  DLBCL (diffuse large B cell lymphoma) - Plan: CBC with Differential, Comprehensive metabolic panel, Lactate dehydrogenase, CBC with Differential, Comprehensive metabolic panel, Lactate dehydrogenase, triamterene-hydrochlorothiazide (DYAZIDE) 37.5-25 MG per capsule  Preventative health care - Plan: influenza vac split quadrivalent PF (FLUARIX) injection 0.5 mL  CURRENT THERAPY: Surveillance  INTERVAL HISTORY: [name] 62 y.o. male returns for  regular  visit for followup of Diffuse large B-cell lymphoma presenting with extensive stage IV a disease including thoracic vertebral body involvement with biopsy on 02/04/2005. He had surgical stabilization of his thoracic spine followed by chemotherapy with R. CHOP. He went on to have ICE chemotherapy pre-transplant followed by total body irradiation followed by stem cell transplant on 01/15/2006. This was an autologous stem cell transplant. We then maintain him on rituximab for 2 years. He did not get a complete remission with the R. CHOP chemotherapy initially. His last PET scan was in November 2011.   I personally reviewed and went over laboratory results with the patient.  Last labs are from May 2014.  We will update labs with lab work today.  At that time however, his labs are stable and solid.   NCCN guidelines recommends the follow surveillance for Stage I-IV NHL for those who attain a complete response to therapy:  A. H&P every 3-6 months for 5 years, then yearly or as clinically indicated.  B. Labs every 3-6 months for 5 years and then annually or as clinically indicated.  C. Repeat CT scans only as clinically indicated.   He requests a flu shot today which we will give.  He reports that he thinks he is due for his next pneumovax in 1-2 years.  He will call [facility] to find out when he got it last.  He will then call us so we can update his immunization  record.   He denies any B symptoms and he feels great.  He is obese so we talked about healthy eating choices and lifestyle choices.  I recommend he start working on weight loss.  He requests a refill on his BP medication.  He plans on seeing PCP in the near future and we have agreed together that I will refill x 30 day supply and he will get in touch with PCP for future refills and BP management.   Hematologically, he denies any complaints and ROS questioning is negative.    Past Medical History  Diagnosis Date  . Hypertension   . Asthma   . 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 Venous insufficiency 04/29/2011  . Obesity, morbid (more than 100 lbs over ideal weight or BMI > 40)   . Edema   . Heart failure, diastolic, chronic     patient denies  . Hyperlipidemia, mixed     has DLBCL (diffuse large B cell lymphoma); HYPERLIPIDEMIA-MIXED; OBESITY-MORBID (>100'); ANEMIA; HYPERTENSION, UNSPECIFIED; DIASTOLIC HEART FAILURE, CHRONIC; EDEMA; Venous insufficiency; and Hx of adenomatous colonic polyps on his problem list.     is allergic to penicillins.  [name] had no medications administered during this visit.  Past Surgical History  Procedure Laterality Date  . Multiple tooth extractions  04/2005  . Back surgery  2006    T6 vertebrae removed/titanium placed  . Lung biopsy  6/06  . Colonoscopy  11/24/2004    Polyps in the left colon ablated/removed as described above.  Two submucosal  lesions consistent with lipomas as described above not  manipulated./ Normal rectum  . Port a cath placement    . Colonoscopy  06/20/2012    Procedure: COLONOSCOPY;  Surgeon: Corbin Ade, MD;  [facility];  Service: Endoscopy;  Laterality: N/A;  1:45  . Limbal stem cell transplant    . Port-a-cath removal  09/08/2012    Procedure: REMOVAL PORT-A-CATH;  Surgeon: Loreli Slot, MD;  Location: [facility] OR;  Service: Thoracic;  Laterality: N/A;    Denies  any headaches, dizziness, double vision, fevers, chills, night sweats, nausea, vomiting, diarrhea, constipation, chest pain, heart palpitations, shortness of breath, blood in stool, black tarry stool, urinary pain, urinary burning, urinary frequency, hematuria.   PHYSICAL EXAMINATION  ECOG PERFORMANCE STATUS: 0 - Asymptomatic  Filed Vitals:   07/14/13 1000  BP: 152/82  Pulse: 86  Temp: 98.6 F (37 C)  Resp: 20    GENERAL:alert, no distress, well nourished, well developed, comfortable, cooperative, obese and smiling SKIN: skin color, texture, turgor are normal, no rashes or significant lesions HEAD: Normocephalic, No masses, lesions, tenderness or abnormalities EYES: normal, PERRLA, EOMI, Conjunctiva are pink and non-injected EARS: External ears normal OROPHARYNX:mucous membranes are moist  NECK: supple, no adenopathy, thyroid normal size, non-tender, without nodularity, no stridor, non-tender, trachea midline LYMPH:  no palpable lymphadenopathy, no hepatosplenomegaly BREAST:not examined LUNGS: clear to auscultation and percussion HEART: regular rate & rhythm, no murmurs, no gallops, S1 normal and S2 normal ABDOMEN:abdomen soft, non-tender, obese, normal bowel sounds, no masses or organomegaly and no hepatosplenomegaly BACK: Back symmetric, no curvature., No CVA tenderness EXTREMITIES:less then 2 second capillary refill, no joint deformities, effusion, or inflammation, no skin discoloration, no clubbing, no cyanosis  NEURO: alert & oriented x 3 with fluent speech, no focal motor/sensory deficits, gait normal   LABORATORY DATA: CBC    Component Value Date/Time   WBC 4.8 01/06/2013 0953   RBC 4.07* 01/06/2013 0953   HGB 12.1* 01/06/2013 0953   HCT 37.0* 01/06/2013 0953   PLT 210 01/06/2013 0953   MCV 90.9 01/06/2013 0953   MCH 29.7 01/06/2013 0953   MCHC 32.7 01/06/2013 0953   RDW 16.6* 01/06/2013 0953   LYMPHSABS 1.8 01/06/2013 0953   MONOABS 0.6 01/06/2013 0953   EOSABS 0.2  01/06/2013 0953   BASOSABS 0.0 01/06/2013 0953      Chemistry      Component Value Date/Time   NA 139 01/06/2013 0953   K 4.3 01/06/2013 0953   CL 106 01/06/2013 0953   CO2 23 01/06/2013 0953   BUN 10 01/06/2013 0953   CREATININE 1.06 01/06/2013 0953      Component Value Date/Time   CALCIUM 9.0 01/06/2013 0953   ALKPHOS 81 01/06/2013 0953   AST 17 01/06/2013 0953   ALT 16 01/06/2013 0953   BILITOT 0.2* 01/06/2013 0953      Results for Lavey, VYRON FRONCZAK (MRN [id number]) as of 07/14/2013 10:37  Ref. Range 01/06/2013 09:53  LDH Latest Range: 94-250 U/L 225      RADIOGRAPHIC STUDIES:  12/01/2012  *RADIOLOGY REPORT*  Clinical Data: Shortness of breath.  CHEST - 2 VIEW  Comparison: Plain films of the chest 09/07/2012 and 10/19/2008.  Findings: Pleural thickening along the right chest wall is stable  in appearance. Lungs are clear. Heart size normal. No  pneumothorax or pleural effusion. The patient is status post mid  thoracic corpectomy and fusion.  IMPRESSION:  No acute finding. Stable compared to prior exam.  Original Report  Authenticated By: Holley Dexter, M.D.    ASSESSMENT:  1. 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 He had surgical stabilization of his thoracic spine followed by chemotherapy with R. CHOP. He went on to have ICE chemotherapy pre-transplant followed by total body irradiation followed by stem cell transplant on 01/15/2006. This was an autologous stem cell transplant. We then maintain him on rituximab for 2 years. He did not get a complete remission with the R. CHOP chemotherapy initially. His last PET scan was in November 2011. 2. Chronic LE edema, seeing physical therapy for lymphedema  Patient Active Problem List   Diagnosis Date Noted  . Hx of adenomatous colonic polyps 06/13/2012  . Venous insufficiency 04/29/2011  . DLBCL (diffuse large B cell lymphoma)  02/28/2009  . HYPERLIPIDEMIA-MIXED 02/28/2009  . OBESITY-MORBID (>100') 02/28/2009  . ANEMIA 02/28/2009  . HYPERTENSION, UNSPECIFIED 02/28/2009  . DIASTOLIC HEART FAILURE, CHRONIC 02/28/2009  . EDEMA 02/28/2009     PLAN:  1. I personally reviewed and went over laboratory results with the patient. 2. I personally reviewed and went over radiographic studies with the patient. 3. Influenza vaccine to be given today. 4. Labs today: CBC diff, CMET, LDH 5. Reviewed the NCCN guidelines for surveillance of NHL Stage IV 6. He will contact the clinic with an update on pneumovax given at [facility] so we can update our immunization record.  7. RX for Dyazide 30 day supply.  Future refills from PCP.  8. Return in 6 months for follow-up   THERAPY PLAN:  Caleel is doing well.  There is no indication of recurrence at this time.  He does stand the chance of experiencing a secondary malignancy due to intensity of previous treatment, young age, and whole body irradiation. NCCN guidelines recommends the follow surveillance for Stage I-IV NHL for those who attain a complete response to therapy:  A. H&P every 3-6 months for 5 years, then yearly or as clinically indicated.  B. Labs every 3-6 months for 5 years and then annually or as clinically indicated.  C. Repeat CT scans only as clinically indicated.    All questions were answered. The patient knows to call the clinic with any problems, questions or concerns. We can certainly see the patient much sooner if necessary.  Patient and plan discussed with Dr. Alla German and he is in agreement with the aforementioned.   Laquan Beier

## 2013-07-14 ENCOUNTER — Encounter (HOSPITAL_COMMUNITY): Payer: Medicare Other | Attending: Oncology | Admitting: Oncology

## 2013-07-14 ENCOUNTER — Encounter (HOSPITAL_COMMUNITY): Payer: Self-pay | Admitting: Oncology

## 2013-07-14 VITALS — BP 152/82 | HR 86 | Temp 98.6°F | Resp 20 | Wt 300.5 lb

## 2013-07-14 DIAGNOSIS — C8589 Other specified types of non-Hodgkin lymphoma, extranodal and solid organ sites: Secondary | ICD-10-CM | POA: Diagnosis not present

## 2013-07-14 DIAGNOSIS — Z0000 Encounter for general adult medical examination without abnormal findings: Secondary | ICD-10-CM

## 2013-07-14 DIAGNOSIS — C8330 Diffuse large B-cell lymphoma, unspecified site: Secondary | ICD-10-CM

## 2013-07-14 DIAGNOSIS — I890 Lymphedema, not elsewhere classified: Secondary | ICD-10-CM | POA: Diagnosis not present

## 2013-07-14 DIAGNOSIS — Z23 Encounter for immunization: Secondary | ICD-10-CM

## 2013-07-14 LAB — CBC WITH DIFFERENTIAL/PLATELET
Basophils Relative: 1 % (ref 0–1)
Eosinophils Absolute: 0.2 10*3/uL (ref 0.0–0.7)
MCH: 28.7 pg (ref 26.0–34.0)
MCHC: 32.6 g/dL (ref 30.0–36.0)
Neutrophils Relative %: 62 % (ref 43–77)
Platelets: 225 10*3/uL (ref 150–400)
RDW: 15.8 % — ABNORMAL HIGH (ref 11.5–15.5)

## 2013-07-14 LAB — COMPREHENSIVE METABOLIC PANEL
ALT: 20 U/L (ref 0–53)
Albumin: 3.5 g/dL (ref 3.5–5.2)
Alkaline Phosphatase: 85 U/L (ref 39–117)
Calcium: 9.6 mg/dL (ref 8.4–10.5)
Potassium: 3.9 mEq/L (ref 3.5–5.1)
Sodium: 138 mEq/L (ref 135–145)
Total Protein: 7.1 g/dL (ref 6.0–8.3)

## 2013-07-14 LAB — LACTATE DEHYDROGENASE: LDH: 238 U/L (ref 94–250)

## 2013-07-14 MED ORDER — TRIAMTERENE-HCTZ 37.5-25 MG PO CAPS: 1.0000 | ORAL_CAPSULE | Freq: Every day | ORAL | Status: DC

## 2013-07-14 MED ORDER — INFLUENZA VAC SPLIT QUAD 0.5 ML IM SUSP
0.5000 mL | Freq: Once | INTRAMUSCULAR | Status: AC
  Administered 2013-07-14: 0.5 mL via INTRAMUSCULAR
  Filled 2013-07-14: qty 0.5

## 2013-07-14 NOTE — Patient Instructions (Signed)
[facility] Discharge Instructions  RECOMMENDATIONS MADE BY THE CONSULTANT AND ANY TEST RESULTS WILL BE SENT TO YOUR REFERRING PHYSICIAN.  EXAM FINDINGS BY THE PHYSICIAN TODAY AND SIGNS OR SYMPTOMS TO REPORT TO CLINIC OR PRIMARY PHYSICIAN: Exam and findings as discussed by Dellis Anes, PA-C.  Will give you your flu shot today.  Report night sweats, fevers, etc.  MEDICATIONS PRESCRIBED:  Refill for Dyazide.  When this runs out get refills from your primary care physician  INSTRUCTIONS/FOLLOW-UP: Follow-up in 6 months.  Thank you for choosing [facility] to provide your oncology and hematology care.  To afford each patient quality time with our providers, please arrive at least 15 minutes before your scheduled appointment time.  With your help, our goal is to use those 15 minutes to complete the necessary work-up to ensure our physicians have the information they need to help with your evaluation and healthcare recommendations.    Effective January 1st, 2014, we ask that you re-schedule your appointment with our physicians should you arrive 10 or more minutes late for your appointment.  We strive to give you quality time with our providers, and arriving late affects you and other patients whose appointments are after yours.    Again, thank you for choosing [facility].  Our hope is that these requests will decrease the amount of time that you wait before being seen by our physicians.       _____________________________________________________________  Should you have questions after your visit to [facility], please contact our office at [phone] between the hours of 8:30 a.m. and 5:00 p.m.  Voicemails left after 4:30 p.m. will not be returned until the following business day.  For prescription refill requests, have your pharmacy contact our office with your prescription refill request.

## 2013-07-14 NOTE — Progress Notes (Signed)
[name] presents today for injection per MD orders. Flu vaccine administered im in right Upper Arm. Administration without incident. Patient tolerated well.

## 2014-01-09 NOTE — Progress Notes (Signed)
[name], MD [place] [address]  DLBCL (diffuse large B cell lymphoma) - Plan: Homeopathic Products (ALLERGY MEDICINE PO), CBC with Differential, Comprehensive metabolic panel, Lactate dehydrogenase, Sedimentation rate, Beta 2 microglobuline, serum  CURRENT THERAPY: Surveillance  INTERVAL HISTORY: [name] 63 y.o. male returns for  regular  visit for followup of Diffuse large B-cell lymphoma presenting with extensive stage IV a disease including thoracic vertebral body involvement with biopsy on 02/04/2005. He had surgical stabilization of his thoracic spine followed by chemotherapy with R. CHOP. He went on to have ICE chemotherapy pre-transplant followed by total body irradiation followed by stem cell transplant on 01/15/2006. This was an autologous stem cell transplant. We then maintain him on rituximab for 2 years. He did not get a complete remission with the R. CHOP chemotherapy initially. His last PET scan was in November 2011.   Oncology History   S/P chemotherapy following stabilization of the spine.  Initial biopsy was on 07/13/2005 on his bone marrow.  Thoracic vertebra was biopsied on 02/04/2005 and mediastinum on 02/03/2005.  S/P R-CHOP and achieved an incomplete response, then underwent ICE chemotherapy pre-transplant and total body radiation with autologous stem cell transplant on 01/15/2006 and maintained on Rituxan for 2 years.  Thus far without recurrence.     DLBCL (diffuse large B cell lymphoma)   01/27/2005 Imaging PET- diffuse activity  involving the L cervical area, extensive activity in the mediastinumn particularly in the R paratracheal area and to a lesser degree in the anterior mediastinum.  Lg area in porta caval nodes in upper abd and diffuse bone activity   02/03/2005 Pathology Mediastinal lymph node demonstrating diffuse large cell lymphoma, B-type.  Vertebral bone biopsy demonstrating diffuse large b-cell lymphoma   03/11/2005 -  08/04/2005 Chemotherapy R-CHOP x 8 cycles   05/04/2005 Imaging PET- near resolution of previously identified uptake.   07/13/2005 Bone Marrow Biopsy Bone marrow aspiration and biopsy is negative for lymphoma   08/31/2005 Imaging PET- further response to therapy with findings consistent with residual disease.  Hypermetabolic activity identified within the anterior mediastinal lymph node (smaller but still pathologic).  Borderline hypermetabolic small right pleural effusion   10/20/2005 - 11/13/2005 Chemotherapy R-ICE x 2 cycles at [facility]   01/08/2006 - 01/09/2006 Chemotherapy Cyclophosphamide 600 mg daily x 2   01/10/2006 - 01/14/2006 Radiation Therapy Total body radiation twice daily   01/15/2006 Bone Marrow Transplant Stem cell transplant at [facility]   04/05/2006 - 12/05/2007 Chemotherapy Rituxan weekly x 4 every 6 months x 2 years   I personally reviewed and went over laboratory results with the patient.  The results are noted within this dictation.  From a hematology standpoint he doing great!  He denies any complaints and ROS questioning is negative, including B symptoms.   He is having a few issues with his medications due to a lack of education regarding the role of the medications.  I educated him on the role of his Dyazide and Kdur.  I educated him that Dyazide is a diuretic and will produce increased urination (which was his compliant with the medication and causing him to take it only PRN).  A side effect of Dyazide is K+ loss and therefore, K+ supplementation is required.  With this information, he is agreeable to be more compliant and I will defer to PCP.  He has exhausted surveillance per NCCN guidelines and therefore, it is appropriate to see him annually with labs.   Past Medical History  Diagnosis Date  . Hypertension   . Asthma   . 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 Venous insufficiency 04/29/2011  . Obesity, morbid (more than 100 lbs over ideal weight or  BMI > 40)   . Edema   . Heart failure, diastolic, chronic     patient denies  . Hyperlipidemia, mixed     has DLBCL (diffuse large B cell lymphoma); HYPERLIPIDEMIA-MIXED; OBESITY-MORBID (>100'); ANEMIA; HYPERTENSION, UNSPECIFIED; DIASTOLIC HEART FAILURE, CHRONIC; EDEMA; Venous insufficiency; and Hx of adenomatous colonic polyps on his problem list.     is allergic to penicillins.  Mr. Biermann does not currently have medications on file.  Past Surgical History  Procedure Laterality Date  . Multiple tooth extractions  04/2005  . Back surgery  2006    T6 vertebrae removed/titanium placed  . Lung biopsy  6/06  . Colonoscopy  11/24/2004    Polyps in the left colon ablated/removed as described above.  Two submucosal lesions consistent with lipomas as described above not  manipulated./ Normal rectum  . Port a cath placement    . Colonoscopy  06/20/2012    Procedure: COLONOSCOPY;  Surgeon: Daneil Dolin, MD;  Location: [facility];  Service: Endoscopy;  Laterality: N/A;  1:45  . Limbal stem cell transplant    . Port-a-cath removal  09/08/2012    Procedure: REMOVAL PORT-A-CATH;  Surgeon: Melrose Nakayama, MD;  Location: [facility];  Service: Thoracic;  Laterality: N/A;    Denies any headaches, dizziness, double vision, fevers, chills, night sweats, nausea, vomiting, diarrhea, constipation, chest pain, heart palpitations, shortness of breath, blood in stool, black tarry stool, urinary pain, urinary burning, urinary frequency, hematuria.   PHYSICAL EXAMINATION  ECOG PERFORMANCE STATUS: 0 - Asymptomatic  Filed Vitals:   01/11/14 0943  BP: 142/81  Pulse: 77  Temp: 97.9 F (36.6 C)  Resp: 20    GENERAL:alert, no distress, well nourished, well developed, comfortable, cooperative, obese and smiling SKIN: skin color, texture, turgor are normal, no rashes or significant lesions HEAD: Normocephalic, No masses, lesions, tenderness or abnormalities EYES: normal, PERRLA, EOMI, Conjunctiva are  pink and non-injected EARS: External ears normal OROPHARYNX:mucous membranes are moist  NECK: supple, no adenopathy, thyroid normal size, non-tender, without nodularity, no stridor, non-tender, trachea midline LYMPH:  no palpable lymphadenopathy, no hepatosplenomegaly BREAST:gynecomastia noted LUNGS: clear to auscultation and percussion HEART: regular rate & rhythm, no murmurs, no gallops, S1 normal and S2 normal ABDOMEN:abdomen soft, non-tender, obese, normal bowel sounds, no masses or organomegaly and body habitus prohibits good abdominal exam BACK: Back symmetric, no curvature., No CVA tenderness EXTREMITIES:less then 2 second capillary refill, no joint deformities, effusion, or inflammation, no skin discoloration, no clubbing, no cyanosis, positive findings:  edema B/L woody infiltration of LE with 2-3+ pitting edema B/L.  NEURO: alert & oriented x 3 with fluent speech, no focal motor/sensory deficits, gait normal    LABORATORY DATA: CBC    Component Value Date/Time   WBC 4.9 01/11/2014 0909   RBC 4.23 01/11/2014 0909   HGB 11.6* 01/11/2014 0909   HCT 36.8* 01/11/2014 0909   PLT 203 01/11/2014 0909   MCV 87.0 01/11/2014 0909   MCH 27.4 01/11/2014 0909   MCHC 31.5 01/11/2014 0909   RDW 16.3* 01/11/2014 0909   LYMPHSABS 1.1 01/11/2014 0909   MONOABS 0.7 01/11/2014 0909   EOSABS 0.3 01/11/2014 0909   BASOSABS 0.0 01/11/2014 0909      Chemistry  Component Value Date/Time   NA 143 01/11/2014 0909   K 4.2 01/11/2014 0909   CL 108 01/11/2014 0909   CO2 23 01/11/2014 0909   BUN 13 01/11/2014 0909   CREATININE 1.15 01/11/2014 0909      Component Value Date/Time   CALCIUM 9.1 01/11/2014 0909   ALKPHOS 91 01/11/2014 0909   AST 19 01/11/2014 0909   ALT 18 01/11/2014 0909   BILITOT 0.2* 01/11/2014 0909      Results for Heiberger, CLINT BIELLO (MRN [id number]) as of 01/11/2014 10:12  Ref. Range 01/11/2014 09:09  LDH Latest Range: 94-250 U/L 217     RADIOGRAPHIC STUDIES:  12/01/2012  *RADIOLOGY  REPORT*  Clinical Data: Shortness of breath.  CHEST - 2 VIEW  Comparison: Plain films of the chest 09/07/2012 and 10/19/2008.  Findings: Pleural thickening along the right chest wall is stable  in appearance. Lungs are clear. Heart size normal. No  pneumothorax or pleural effusion. The patient is status post mid  thoracic corpectomy and fusion.  IMPRESSION:  No acute finding. Stable compared to prior exam.  Original Report Authenticated By: Orlean Patten, M.D.    ASSESSMENT:  1. 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 He had surgical stabilization of his thoracic spine followed by chemotherapy with R. CHOP. He went on to have ICE chemotherapy pre-transplant followed by total body irradiation followed by stem cell transplant on 01/15/2006. This was an autologous stem cell transplant. We then maintain him on rituximab for 2 years. He did not get a complete remission with the R. CHOP chemotherapy initially. His last PET scan was in November 2011.  Patient Active Problem List   Diagnosis Date Noted  . Hx of adenomatous colonic polyps 06/13/2012  . Venous insufficiency 04/29/2011  . DLBCL (diffuse large B cell lymphoma) 02/28/2009  . HYPERLIPIDEMIA-MIXED 02/28/2009  . OBESITY-MORBID (>100') 02/28/2009  . ANEMIA 02/28/2009  . HYPERTENSION, UNSPECIFIED 02/28/2009  . DIASTOLIC HEART FAILURE, CHRONIC 02/28/2009  . EDEMA 02/28/2009     PLAN:  1. I personally reviewed and went over laboratory results with the patient.  The results are noted within this dictation. 2. I personally reviewed and went over radiographic studies with the patient.  The results are noted within this dictation.   3. Chart reviewed 4. NCCN guidelines pertaining to Stage IV NHL surveillance reviewed 5. Labs today and again in 12 months: CBC diff, CMET, LDH, ESR and B2M. 6. Patient education regarding his  diuretic medication and K+ supplementation 7. Return in 1 year for follow-up   THERAPY PLAN:  Jules is doing well. There is no indication of recurrence at this time. He does stand the chance of experiencing a secondary malignancy due to intensity of previous treatment, young age, and whole body irradiation. NCCN guidelines recommends the follow surveillance for Stage I-IV NHL for those who attain a complete response to therapy:  A. H&P every 3-6 months for 5 years, then yearly or as clinically indicated.  B. Labs every 3-6 months for 5 years and then annually or as clinically indicated.  C. Repeat CT scans only as clinically indicated.    All questions were answered. The patient knows to call the clinic with any problems, questions or concerns. We can certainly see the patient much sooner if necessary.  Patient and plan discussed with Dr. Farrel Gobble and he is in agreement with the aforementioned.   [facility] 01/11/2014

## 2014-01-11 ENCOUNTER — Encounter (HOSPITAL_COMMUNITY): Payer: Medicare Other

## 2014-01-11 ENCOUNTER — Encounter (HOSPITAL_COMMUNITY): Payer: Medicare Other | Attending: Oncology | Admitting: Oncology

## 2014-01-11 ENCOUNTER — Encounter (HOSPITAL_COMMUNITY): Payer: Self-pay | Admitting: Oncology

## 2014-01-11 VITALS — BP 142/81 | HR 77 | Temp 97.9°F | Resp 20 | Wt 309.8 lb

## 2014-01-11 DIAGNOSIS — I872 Venous insufficiency (chronic) (peripheral): Secondary | ICD-10-CM | POA: Diagnosis not present

## 2014-01-11 DIAGNOSIS — Z6841 Body Mass Index (BMI) 40.0 and over, adult: Secondary | ICD-10-CM | POA: Insufficient documentation

## 2014-01-11 DIAGNOSIS — Z9489 Other transplanted organ and tissue status: Secondary | ICD-10-CM | POA: Diagnosis not present

## 2014-01-11 DIAGNOSIS — J45909 Unspecified asthma, uncomplicated: Secondary | ICD-10-CM | POA: Diagnosis not present

## 2014-01-11 DIAGNOSIS — C8330 Diffuse large B-cell lymphoma, unspecified site: Secondary | ICD-10-CM

## 2014-01-11 DIAGNOSIS — Z9221 Personal history of antineoplastic chemotherapy: Secondary | ICD-10-CM | POA: Diagnosis not present

## 2014-01-11 DIAGNOSIS — E782 Mixed hyperlipidemia: Secondary | ICD-10-CM | POA: Diagnosis not present

## 2014-01-11 DIAGNOSIS — Z09 Encounter for follow-up examination after completed treatment for conditions other than malignant neoplasm: Secondary | ICD-10-CM | POA: Diagnosis not present

## 2014-01-11 DIAGNOSIS — I10 Essential (primary) hypertension: Secondary | ICD-10-CM | POA: Insufficient documentation

## 2014-01-11 DIAGNOSIS — C8589 Other specified types of non-Hodgkin lymphoma, extranodal and solid organ sites: Secondary | ICD-10-CM

## 2014-01-11 DIAGNOSIS — I509 Heart failure, unspecified: Secondary | ICD-10-CM | POA: Diagnosis not present

## 2014-01-11 DIAGNOSIS — I5032 Chronic diastolic (congestive) heart failure: Secondary | ICD-10-CM | POA: Diagnosis not present

## 2014-01-11 LAB — CBC WITH DIFFERENTIAL/PLATELET
BASOS ABS: 0 10*3/uL (ref 0.0–0.1)
Basophils Relative: 1 % (ref 0–1)
EOS ABS: 0.3 10*3/uL (ref 0.0–0.7)
EOS PCT: 5 % (ref 0–5)
HCT: 36.8 % — ABNORMAL LOW (ref 39.0–52.0)
Hemoglobin: 11.6 g/dL — ABNORMAL LOW (ref 13.0–17.0)
LYMPHS PCT: 23 % (ref 12–46)
Lymphs Abs: 1.1 10*3/uL (ref 0.7–4.0)
MCH: 27.4 pg (ref 26.0–34.0)
MCHC: 31.5 g/dL (ref 30.0–36.0)
MCV: 87 fL (ref 78.0–100.0)
Monocytes Absolute: 0.7 10*3/uL (ref 0.1–1.0)
Monocytes Relative: 14 % — ABNORMAL HIGH (ref 3–12)
NEUTROS PCT: 57 % (ref 43–77)
Neutro Abs: 2.8 10*3/uL (ref 1.7–7.7)
PLATELETS: 203 10*3/uL (ref 150–400)
RBC: 4.23 MIL/uL (ref 4.22–5.81)
RDW: 16.3 % — AB (ref 11.5–15.5)
WBC: 4.9 10*3/uL (ref 4.0–10.5)

## 2014-01-11 LAB — COMPREHENSIVE METABOLIC PANEL
ALT: 18 U/L (ref 0–53)
AST: 19 U/L (ref 0–37)
Albumin: 3.6 g/dL (ref 3.5–5.2)
Alkaline Phosphatase: 91 U/L (ref 39–117)
BUN: 13 mg/dL (ref 6–23)
CALCIUM: 9.1 mg/dL (ref 8.4–10.5)
CO2: 23 mEq/L (ref 19–32)
Chloride: 108 mEq/L (ref 96–112)
Creatinine, Ser: 1.15 mg/dL (ref 0.50–1.35)
GFR calc Af Amer: 78 mL/min — ABNORMAL LOW (ref >90)
GFR calc non Af Amer: 67 mL/min — ABNORMAL LOW (ref >90)
Glucose, Bld: 105 mg/dL — ABNORMAL HIGH (ref 70–99)
Potassium: 4.2 mEq/L (ref 3.7–5.3)
SODIUM: 143 meq/L (ref 137–147)
TOTAL PROTEIN: 6.7 g/dL (ref 6.0–8.3)
Total Bilirubin: 0.2 mg/dL — ABNORMAL LOW (ref 0.3–1.2)

## 2014-01-11 LAB — LACTATE DEHYDROGENASE: LDH: 217 U/L (ref 94–250)

## 2014-01-11 NOTE — Patient Instructions (Signed)
[facility] Discharge Instructions  RECOMMENDATIONS MADE BY THE CONSULTANT AND ANY TEST RESULTS WILL BE SENT TO YOUR REFERRING PHYSICIAN.  EXAM FINDINGS BY THE PHYSICIAN TODAY AND SIGNS OR SYMPTOMS TO REPORT TO CLINIC OR PRIMARY PHYSICIAN: Exam and findings as discussed by Robynn Pane, PA-C.  Report any fevers, night sweats, unexplained weight loss, new lumps, etc.  MEDICATIONS PRESCRIBED:  none  INSTRUCTIONS/FOLLOW-UP: Follow-up in 1 year with labs and office visit.  Thank you for choosing [facility] to provide your oncology and hematology care.  To afford each patient quality time with our providers, please arrive at least 15 minutes before your scheduled appointment time.  With your help, our goal is to use those 15 minutes to complete the necessary work-up to ensure our physicians have the information they need to help with your evaluation and healthcare recommendations.    Effective January 1st, 2014, we ask that you re-schedule your appointment with our physicians should you arrive 10 or more minutes late for your appointment.  We strive to give you quality time with our providers, and arriving late affects you and other patients whose appointments are after yours.    Again, thank you for choosing [facility].  Our hope is that these requests will decrease the amount of time that you wait before being seen by our physicians.       _____________________________________________________________  Should you have questions after your visit to [facility], please contact our office at [phone] between the hours of 8:30 a.m. and 5:00 p.m.  Voicemails left after 4:30 p.m. will not be returned until the following business day.  For prescription refill requests, have your pharmacy contact our office with your prescription refill request.

## 2014-01-19 NOTE — Progress Notes (Signed)
Labs drawn

## 2015-01-10 ENCOUNTER — Ambulatory Visit (HOSPITAL_COMMUNITY): Payer: Medicare Other | Admitting: Oncology

## 2015-01-10 ENCOUNTER — Encounter (HOSPITAL_COMMUNITY): Payer: Medicare Other | Attending: Hematology & Oncology

## 2015-01-10 ENCOUNTER — Encounter (HOSPITAL_COMMUNITY): Payer: Self-pay | Admitting: Hematology & Oncology

## 2015-01-10 ENCOUNTER — Encounter (HOSPITAL_BASED_OUTPATIENT_CLINIC_OR_DEPARTMENT_OTHER): Payer: Medicare Other | Admitting: Hematology & Oncology

## 2015-01-10 VITALS — BP 124/59 | HR 80 | Temp 98.5°F | Resp 18 | Wt 312.6 lb

## 2015-01-10 DIAGNOSIS — D649 Anemia, unspecified: Secondary | ICD-10-CM | POA: Insufficient documentation

## 2015-01-10 DIAGNOSIS — C8330 Diffuse large B-cell lymphoma, unspecified site: Secondary | ICD-10-CM

## 2015-01-10 LAB — VITAMIN B12: VITAMIN B 12: 575 pg/mL (ref 180–914)

## 2015-01-10 LAB — CBC WITH DIFFERENTIAL/PLATELET
BASOS PCT: 1 % (ref 0–1)
Basophils Absolute: 0 10*3/uL (ref 0.0–0.1)
EOS ABS: 0.2 10*3/uL (ref 0.0–0.7)
EOS PCT: 5 % (ref 0–5)
HCT: 31.7 % — ABNORMAL LOW (ref 39.0–52.0)
Hemoglobin: 9.4 g/dL — ABNORMAL LOW (ref 13.0–17.0)
LYMPHS ABS: 0.9 10*3/uL (ref 0.7–4.0)
Lymphocytes Relative: 22 % (ref 12–46)
MCH: 23.2 pg — AB (ref 26.0–34.0)
MCHC: 29.7 g/dL — AB (ref 30.0–36.0)
MCV: 78.3 fL (ref 78.0–100.0)
MONOS PCT: 15 % — AB (ref 3–12)
Monocytes Absolute: 0.6 10*3/uL (ref 0.1–1.0)
Neutro Abs: 2.3 10*3/uL (ref 1.7–7.7)
Neutrophils Relative %: 57 % (ref 43–77)
PLATELETS: 124 10*3/uL — AB (ref 150–400)
RBC: 4.05 MIL/uL — AB (ref 4.22–5.81)
RDW: 17.8 % — ABNORMAL HIGH (ref 11.5–15.5)
WBC: 3.9 10*3/uL — ABNORMAL LOW (ref 4.0–10.5)

## 2015-01-10 LAB — IRON AND TIBC
Iron: 22 ug/dL — ABNORMAL LOW (ref 45–182)
Saturation Ratios: 4 % — ABNORMAL LOW (ref 17.9–39.5)
TIBC: 491 ug/dL — ABNORMAL HIGH (ref 250–450)
UIBC: 469 ug/dL

## 2015-01-10 LAB — RETICULOCYTES
RBC.: 3.87 MIL/uL — ABNORMAL LOW (ref 4.22–5.81)
RETIC CT PCT: 1.8 % (ref 0.4–3.1)
Retic Count, Absolute: 69.7 10*3/uL (ref 19.0–186.0)

## 2015-01-10 LAB — COMPREHENSIVE METABOLIC PANEL
ALBUMIN: 4.1 g/dL (ref 3.5–5.0)
ALT: 25 U/L (ref 17–63)
AST: 24 U/L (ref 15–41)
Alkaline Phosphatase: 75 U/L (ref 38–126)
Anion gap: 7 (ref 5–15)
BUN: 15 mg/dL (ref 6–20)
CHLORIDE: 108 mmol/L (ref 101–111)
CO2: 24 mmol/L (ref 22–32)
CREATININE: 1.09 mg/dL (ref 0.61–1.24)
Calcium: 8.9 mg/dL (ref 8.9–10.3)
GFR calc Af Amer: >60 mL/min (ref >60)
Glucose, Bld: 115 mg/dL — ABNORMAL HIGH (ref 65–99)
POTASSIUM: 4.6 mmol/L (ref 3.5–5.1)
Sodium: 139 mmol/L (ref 135–145)
Total Bilirubin: 0.5 mg/dL (ref 0.3–1.2)
Total Protein: 6.9 g/dL (ref 6.5–8.1)

## 2015-01-10 LAB — FERRITIN: Ferritin: 11 ng/mL — ABNORMAL LOW (ref 24–336)

## 2015-01-10 LAB — SEDIMENTATION RATE: Sed Rate: 25 mm/hr — ABNORMAL HIGH (ref 0–16)

## 2015-01-10 LAB — LACTATE DEHYDROGENASE: LDH: 207 U/L — ABNORMAL HIGH (ref 98–192)

## 2015-01-10 LAB — FOLATE: FOLATE: 25 ng/mL (ref >5.9)

## 2015-01-10 NOTE — Progress Notes (Signed)
[name]., MD [place] [address]  Diffuse large B cell lymphoma  CURRENT THERAPY: Surveillance  INTERVAL HISTORY: [name] 64 y.o. male returns for  regular  visit for followup of Diffuse large B-cell lymphoma presenting with extensive stage IV a disease including thoracic vertebral body involvement with biopsy on 02/04/2005. He had surgical stabilization of his thoracic spine followed by chemotherapy with R. CHOP. He went on to have ICE chemotherapy pre-transplant followed by total body irradiation followed by stem cell transplant on 01/15/2006. This was an autologous stem cell transplant. We then maintain him on rituximab for 2 years. He did not get a complete remission with the R. CHOP chemotherapy initially. His last PET scan was in November 2011.  He has stopped taking his high blood pressure medication. He is eating and sleeping well. He reports his weight varies. The patient also reports low back pain. He has occasional knee pain. The patient reports swelling in his lower extremities. He denies any blood in the stool. Patient states he has been more tired.  Oncology History   S/P chemotherapy following stabilization of the spine.  Initial biopsy was on 07/13/2005 on his bone marrow.  Thoracic vertebra was biopsied on 02/04/2005 and mediastinum on 02/03/2005.  S/P R-CHOP and achieved an incomplete response, then underwent ICE chemotherapy pre-transplant and total body radiation with autologous stem cell transplant on 01/15/2006 and maintained on Rituxan for 2 years.  Thus far without recurrence.     DLBCL (diffuse large B cell lymphoma)   01/27/2005 Imaging PET- diffuse activity  involving the L cervical area, extensive activity in the mediastinumn particularly in the R paratracheal area and to a lesser degree in the anterior mediastinum.  Lg area in porta caval nodes in upper abd and diffuse bone activity   02/03/2005 Pathology Results Mediastinal lymph  node demonstrating diffuse large cell lymphoma, B-type.  Vertebral bone biopsy demonstrating diffuse large b-cell lymphoma   03/11/2005 - 08/04/2005 Chemotherapy R-CHOP x 8 cycles   05/04/2005 Imaging PET- near resolution of previously identified uptake.   07/13/2005 Bone Marrow Biopsy Bone marrow aspiration and biopsy is negative for lymphoma   08/31/2005 Imaging PET- further response to therapy with findings consistent with residual disease.  Hypermetabolic activity identified within the anterior mediastinal lymph node (smaller but still pathologic).  Borderline hypermetabolic small right pleural effusion   10/20/2005 - 11/13/2005 Chemotherapy R-[facility] x 2 cycles at [facility]   01/08/2006 - 01/09/2006 Chemotherapy Cyclophosphamide 600 mg daily x 2   01/10/2006 - 01/14/2006 Radiation Therapy Total body radiation twice daily   01/15/2006 Bone Marrow Transplant Stem cell transplant at [facility]   04/05/2006 - 12/05/2007 Chemotherapy Rituxan weekly x 4 every 6 months x 2 years   I personally reviewed and went over laboratory results with the patient.  The results are noted within this dictation.  From a hematology standpoint he doing great!  He denies any complaints and ROS questioning is negative.     Past Medical History  Diagnosis Date  . Hypertension   . Asthma   . 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 Venous insufficiency 04/29/2011  . Obesity, morbid (more than 100 lbs over ideal weight or BMI > 40)   . Edema   . Heart failure, diastolic, chronic     patient denies  . Hyperlipidemia, mixed     has DLBCL (diffuse large B cell lymphoma); HYPERLIPIDEMIA-MIXED; OBESITY-MORBID (>100'); ANEMIA; HYPERTENSION, UNSPECIFIED; DIASTOLIC  HEART FAILURE, CHRONIC; EDEMA; Venous insufficiency; and Hx of adenomatous colonic polyps on his problem list.     is allergic to penicillins.  [name] does not currently have medications on file.  Past Surgical History  Procedure Laterality  Date  . Multiple tooth extractions  04/2005  . Back surgery  2006    T6 vertebrae removed/titanium placed  . Lung biopsy  6/06  . Colonoscopy  11/24/2004    Polyps in the left colon ablated/removed as described above.  Two submucosal lesions consistent with lipomas as described above not  manipulated./ Normal rectum  . Port a cath placement    . Colonoscopy  06/20/2012    Procedure: COLONOSCOPY;  Surgeon: Daneil Dolin, MD;  [facility];  Service: Endoscopy;  Laterality: N/A;  1:45  . Limbal stem cell transplant    . Port-a-cath removal  09/08/2012    Procedure: REMOVAL PORT-A-CATH;  Surgeon: Melrose Nakayama, MD;  Location: [facility];  Service: Thoracic;  Laterality: N/A;   Social History: He has been married for 32 years with 2 children   ROS: Denies any headaches, dizziness, double vision, fevers, chills, night sweats, nausea, vomiting, diarrhea, constipation, chest pain, heart palpitations, shortness of breath, blood in stool,  urinary pain, urinary burning, urinary frequency, hematuria.   PHYSICAL EXAMINATION  ECOG PERFORMANCE STATUS: 0 - Asymptomatic  Filed Vitals:   01/10/15 0900  BP: 124/59  Pulse: 80  Temp: 98.5 F (36.9 C)  Resp: 18    GENERAL:alert, no distress, well nourished, well developed, comfortable, cooperative, obese and smiling SKIN: skin color, texture, turgor are normal, no rashes or significant lesions HEAD: Normocephalic, No masses, lesions, tenderness or abnormalities EYES: normal, PERRLA, EOMI, Conjunctiva are pink and non-injected EARS: External ears normal OROPHARYNX:mucous membranes are moist  NECK: supple, no adenopathy, thyroid normal size, non-tender, without nodularity, no stridor, non-tender, trachea midline LYMPH:  no palpable lymphadenopathy, no hepatosplenomegaly BREAST:gynecomastia noted LUNGS: clear to auscultation and percussion HEART: regular rate & rhythm, no murmurs, no gallops, S1 normal and S2 normal ABDOMEN:abdomen  soft, non-tender, obese, normal bowel sounds, no masses or organomegaly and body habitus prohibits good abdominal exam BACK: Back symmetric, no curvature., No CVA tenderness EXTREMITIES:less then 2 second capillary refill, no joint deformities, effusion, or inflammation, no clubbing, no cyanosis, positive findings:  Firm 3+ edema in LE B/L. Chronic LE skin changes. Shoe laces are untied secondary to swelling NEURO: alert & oriented x 3 with fluent speech, no focal motor/sensory deficits, gait normal    LABORATORY DATA: CBC    Component Value Date/Time   WBC 3.9* 01/10/2015 0852   RBC 4.05* 01/10/2015 0852   HGB 9.4* 01/10/2015 0852   HCT 31.7* 01/10/2015 0852   PLT 124* 01/10/2015 0852   MCV 78.3 01/10/2015 0852   MCH 23.2* 01/10/2015 0852   MCHC 29.7* 01/10/2015 0852   RDW 17.8* 01/10/2015 0852   LYMPHSABS 0.9 01/10/2015 0852   MONOABS 0.6 01/10/2015 0852   EOSABS 0.2 01/10/2015 0852   BASOSABS 0.0 01/10/2015 0852      Chemistry      Component Value Date/Time   NA 143 01/11/2014 0909   K 4.2 01/11/2014 0909   CL 108 01/11/2014 0909   CO2 23 01/11/2014 0909   BUN 13 01/11/2014 0909   CREATININE 1.15 01/11/2014 0909      Component Value Date/Time   CALCIUM 9.1 01/11/2014 0909   ALKPHOS 91 01/11/2014 0909   AST 19 01/11/2014 0909   ALT  18 01/11/2014 0909   BILITOT 0.2* 01/11/2014 0909      Results for [name], [name] (MRN [id number]) as of 01/11/2014 10:12  Ref. Range 01/11/2014 09:09  LDH Latest Range: 94-250 U/L 217     ASSESSMENT:  Stage IV DLBCL Anemia HIstory of Colonic polyps  His lymphoma was back in 2006 and at this point he is considered cured. He did undergo a bone marrow transplant and therefore continuing with observation and blood counts is reasonable. He has a worsening anemia of uncertain etiology. I discussed this with him today. Have recommended additional laboratory studies and Hemoccult cards. His last colonoscopy was in October 2013. He has a  history of colonic polyps although they have always been benign. Additional recommendations in regards to his anemia will be made once laboratory studies are available. He will return in 3 weeks for additional review and recommendations regarding further follow-up.  Patient Active Problem List   Diagnosis Date Noted  . Hx of adenomatous colonic polyps 06/13/2012  . Venous insufficiency 04/29/2011  . DLBCL (diffuse large B cell lymphoma) 02/28/2009  . HYPERLIPIDEMIA-MIXED 02/28/2009  . OBESITY-MORBID (>100') 02/28/2009  . ANEMIA 02/28/2009  . HYPERTENSION, UNSPECIFIED 02/28/2009  . DIASTOLIC HEART FAILURE, CHRONIC 02/28/2009  . EDEMA 02/28/2009    All questions were answered. The patient knows to call the clinic with any problems, questions or concerns. We can certainly see the patient much sooner if necessary.   This document serves as a record of services personally performed by Ancil Linsey, MD. It was created on her behalf by Pearlie Oyster, a trained medical scribe. The creation of this record is based on the scribe's personal observations and the provider's statements to them. This document has been checked and approved by the attending provider.    I have reviewed the above documentation for accuracy and completeness, and I agree with the above.  Frank Fam. Rushie Brazel MD

## 2015-01-10 NOTE — Patient Instructions (Signed)
[place] at [facility] Discharge Instructions  RECOMMENDATIONS MADE BY THE CONSULTANT AND ANY TEST RESULTS WILL BE SENT TO YOUR REFERRING PHYSICIAN.  Exam and discussion by Dr. Whitney Muse.  Need to do some additional labs today because your hemoglobin has dropped to 9.4. If we need to do anything based upon lab results we will contact you. Will also get you to collect some stool samples to be checked for blood.  Follow-up in 1 month.  Thank you for choosing [place] at [facility] to provide your oncology and hematology care.  To afford each patient quality time with our provider, please arrive at least 15 minutes before your scheduled appointment time.    You need to re-schedule your appointment should you arrive 10 or more minutes late.  We strive to give you quality time with our providers, and arriving late affects you and other patients whose appointments are after yours.  Also, if you no show three or more times for appointments you may be dismissed from the clinic at the providers discretion.     Again, thank you for choosing [facility].  Our hope is that these requests will decrease the amount of time that you wait before being seen by our physicians.       _____________________________________________________________  Should you have questions after your visit to [facility], please contact our office at [phone] between the hours of 8:30 a.m. and 4:30 p.m.  Voicemails left after 4:30 p.m. will not be returned until the following business day.  For prescription refill requests, have your pharmacy contact our office.    \Fecal Occult Blood Test This is a test done on a stool specimen to screen for gastrointestinal bleeding, which may be an indicator of colon cancer Is is usually done as part of a routine examination, annually, after age 67 or as directed by your caregiver. The fecal occult blood test (FOBT)  checks for blood in your stool. Normally, there will not be enough blood lost through the gastrointestinal tract to turn an FOBT positive or for you to notice it visually in the form of bloody or dark, tarry stools. Any significant amount of blood being passed should be investigated.  A positive FOBT will tell your caregiver that you have bleeding occurring somewhere in your gastrointestinal tract. This blood loss could be due to ulcers, diverticulosis, bleeding polyps, inflammatory bowel disease, hemorrhoids, from swallowed blood due to bleeding gums or nosebleeds, or it could be due to benign or cancerous tumors. Anything that protrudes into the lumen (the empty space in the intestine), like a polyp or tumor, and is rubbed against by the fecal waste as it passes through has the potential to eventually bleed intermittently. Often this small amount of blood is the first, and sometimes the only, symptom of early colon cancer, making the FOBT a valuable screening tool. PREPARATION FOR TEST  You should not eat red meat within three days before testing. Other substances that could cause a false positive test result include fish, turnips, horseradish, and drugs such as colchicines and oxidizing drugs (for example, iodine and boric acid). Be sure to carefully follow your caregiver's instructions. With FOBT, your caregiver or laboratory will give you one or more test "cards." You collect a separate sample from three different stools, usually on consecutive days. Each stool sample should be collected into a clean container and should not be contaminated with urine or water. The  slide is labeled with your name and the date; then, with an applicator stick, you apply a thin smear of stool onto each filter paper square/window contained on the card. Allow the filter paper to dry. Once it is dry, it is stable. Usually you will collect all of the consecutive samples, and then return all of them to your caregiver or laboratory  at the same time, sometimes by mailing them. There are also over the counter tests which are dropped in your toilet. NORMAL FINDINGS   No occult blood within the stool.  The FOBT test is normally negative. A positive indicates either blood in the stool or an interfering substance. Multiple samples are done to: 1) catch intermittent bleeding; and 2) help rule out false positives. Ranges for normal findings may vary among different laboratories and hospitals. You should always check with your doctor after having lab work or other tests done to discuss the meaning of your test results and whether your values are considered within normal limits. MEANING OF TEST  Your caregiver will go over the test results with you and discuss the importance and meaning of your results, as well as treatment options and the need for additional tests if necessary. OBTAINING THE TEST RESULTS  It is your responsibility to obtain your test results. Ask the lab or department performing the test when and how you will get your results. Document Released: 09/04/2004 Document Revised: 11/02/2011 Document Reviewed: 07/20/2008 [facility] Patient Information 2015 [place], [place]. This information is not intended to replace advice given to you by your health care provider. Make sure you discuss any questions you have with your health care provider.

## 2015-01-10 NOTE — Progress Notes (Signed)
LABS DRAWN

## 2015-01-11 LAB — PROTEIN ELECTROPHORESIS, SERUM
A/G RATIO SPE: 1.3 (ref 0.7–2.0)
ALBUMIN ELP: 3.5 g/dL (ref 3.2–5.6)
ALPHA-2-GLOBULIN: 0.6 g/dL (ref 0.4–1.2)
Alpha-1-Globulin: 0.3 g/dL (ref 0.1–0.4)
Beta Globulin: 1.2 g/dL (ref 0.6–1.3)
GAMMA GLOBULIN: 0.6 g/dL (ref 0.5–1.6)
Globulin, Total: 2.6 g/dL (ref 2.0–4.5)
TOTAL PROTEIN ELP: 6.1 g/dL (ref 6.0–8.5)

## 2015-01-11 LAB — KAPPA/LAMBDA LIGHT CHAINS
KAPPA FREE LGHT CHN: 12.72 mg/L (ref 3.30–19.40)
KAPPA, LAMDA LIGHT CHAIN RATIO: 0.6 (ref 0.26–1.65)
Lambda free light chains: 21.31 mg/L (ref 5.71–26.30)

## 2015-01-11 LAB — IMMUNOFIXATION ELECTROPHORESIS
IgA: 50 mg/dL — ABNORMAL LOW (ref 61–437)
IgG (Immunoglobin G), Serum: 620 mg/dL — ABNORMAL LOW (ref 700–1600)
IgM, Serum: 124 mg/dL (ref 20–172)
TOTAL PROTEIN ELP: 6.1 g/dL (ref 6.0–8.5)

## 2015-01-11 LAB — IGG, IGA, IGM
IGA: 54 mg/dL — AB (ref 61–437)
IGM, SERUM: 125 mg/dL (ref 20–172)
IgG (Immunoglobin G), Serum: 716 mg/dL (ref 700–1600)

## 2015-01-11 LAB — BETA 2 MICROGLOBULIN, SERUM: BETA 2 MICROGLOBULIN: 2.2 mg/L (ref 0.6–2.4)

## 2015-01-11 LAB — HAPTOGLOBIN: HAPTOGLOBIN: 147 mg/dL (ref 34–200)

## 2015-01-16 ENCOUNTER — Encounter (HOSPITAL_COMMUNITY): Payer: Medicare Other

## 2015-01-16 ENCOUNTER — Other Ambulatory Visit (HOSPITAL_COMMUNITY): Payer: Self-pay | Admitting: *Deleted

## 2015-01-16 DIAGNOSIS — C8330 Diffuse large B-cell lymphoma, unspecified site: Secondary | ICD-10-CM | POA: Diagnosis not present

## 2015-01-16 DIAGNOSIS — Z86010 Personal history of colonic polyps: Secondary | ICD-10-CM

## 2015-01-16 DIAGNOSIS — D649 Anemia, unspecified: Secondary | ICD-10-CM | POA: Diagnosis not present

## 2015-01-16 LAB — OCCULT BLOOD X 1 CARD TO LAB, STOOL
FECAL OCCULT BLD: NEGATIVE
Fecal Occult Bld: NEGATIVE
Fecal Occult Bld: NEGATIVE

## 2015-01-16 NOTE — Progress Notes (Addendum)
Labs drawn cards x3 dropped off

## 2015-01-29 DIAGNOSIS — I10 Essential (primary) hypertension: Secondary | ICD-10-CM | POA: Diagnosis not present

## 2015-01-29 DIAGNOSIS — R600 Localized edema: Secondary | ICD-10-CM | POA: Diagnosis not present

## 2015-01-29 DIAGNOSIS — E119 Type 2 diabetes mellitus without complications: Secondary | ICD-10-CM | POA: Diagnosis not present

## 2015-01-29 DIAGNOSIS — Z681 Body mass index (BMI) 19 or less, adult: Secondary | ICD-10-CM | POA: Diagnosis not present

## 2015-02-04 ENCOUNTER — Encounter (HOSPITAL_COMMUNITY): Payer: Medicare Other | Attending: Hematology & Oncology | Admitting: Hematology & Oncology

## 2015-02-04 ENCOUNTER — Encounter (HOSPITAL_COMMUNITY): Payer: Self-pay | Admitting: Hematology & Oncology

## 2015-02-04 VITALS — BP 146/57 | HR 88 | Temp 98.2°F | Resp 18 | Wt 300.6 lb

## 2015-02-04 DIAGNOSIS — D649 Anemia, unspecified: Secondary | ICD-10-CM | POA: Diagnosis not present

## 2015-02-04 DIAGNOSIS — Z8572 Personal history of non-Hodgkin lymphomas: Secondary | ICD-10-CM

## 2015-02-04 DIAGNOSIS — C8330 Diffuse large B-cell lymphoma, unspecified site: Secondary | ICD-10-CM | POA: Insufficient documentation

## 2015-02-04 DIAGNOSIS — K635 Polyp of colon: Secondary | ICD-10-CM

## 2015-02-04 DIAGNOSIS — C8590 Non-Hodgkin lymphoma, unspecified, unspecified site: Secondary | ICD-10-CM

## 2015-02-04 DIAGNOSIS — D508 Other iron deficiency anemias: Secondary | ICD-10-CM

## 2015-02-04 NOTE — Progress Notes (Signed)
[name]., [name] [place] [address]  Diffuse large B cell lymphoma, Stage IV disease Treated in 2006  CURRENT THERAPY: Surveillance  INTERVAL HISTORY: [name] 64 y.o. male returns for  regular  visit for followup of Diffuse large B-cell lymphoma presenting with extensive stage IV a disease including thoracic vertebral body involvement with biopsy on 02/04/2005. He had surgical stabilization of his thoracic spine followed by chemotherapy with R. CHOP. He went on to have ICE chemotherapy pre-transplant followed by total body irradiation followed by stem cell transplant on 01/15/2006. This was an autologous stem cell transplant. We then maintain him on rituximab for 2 years. He did not get a complete remission with the R. CHOP chemotherapy initially. His last PET scan was in November 2011.  He is present alone today and says he is doing fine. Colonoscopy done in 2013 with Dr. Sydell Axon.   Previously had a Fissure, no extreme bleeding now. He notes that approximately 8 months ago, he had thick black stools  He denies any other major concerns. His PS is unchanged. His appetite is excellent.  Oncology History   S/P chemotherapy following stabilization of the spine.  Initial biopsy was on 07/13/2005 on his bone marrow.  Thoracic vertebra was biopsied on 02/04/2005 and mediastinum on 02/03/2005.  S/P R-CHOP and achieved an incomplete response, then underwent ICE chemotherapy pre-transplant and total body radiation with autologous stem cell transplant on 01/15/2006 and maintained on Rituxan for 2 years.  Thus far without recurrence.     DLBCL (diffuse large B cell lymphoma)   01/27/2005 Imaging PET- diffuse activity  involving the L cervical area, extensive activity in the mediastinumn particularly in the R paratracheal area and to a lesser degree in the anterior mediastinum.  Lg area in porta caval nodes in upper abd and diffuse bone activity   02/03/2005 Pathology  Results Mediastinal lymph node demonstrating diffuse large cell lymphoma, B-type.  Vertebral bone biopsy demonstrating diffuse large b-cell lymphoma   03/11/2005 - 08/04/2005 Chemotherapy R-CHOP x 8 cycles   05/04/2005 Imaging PET- near resolution of previously identified uptake.   07/13/2005 Bone Marrow Biopsy Bone marrow aspiration and biopsy is negative for lymphoma   08/31/2005 Imaging PET- further response to therapy with findings consistent with residual disease.  Hypermetabolic activity identified within the anterior mediastinal lymph node (smaller but still pathologic).  Borderline hypermetabolic small right pleural effusion   10/20/2005 - 11/13/2005 Chemotherapy R-ICE x 2 cycles at [facility]   01/08/2006 - 01/09/2006 Chemotherapy Cyclophosphamide 600 mg daily x 2   01/10/2006 - 01/14/2006 Radiation Therapy Total body radiation twice daily   01/15/2006 Bone Marrow Transplant St[facility] cell transplant at [facility]   04/05/2006 - 12/05/2007 Chemotherapy Rituxan weekly x 4 every 6 months x 2 years    Past Medical History  Diagnosis Date  . Hypertension   . Asthma   . 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 Venous insufficiency 04/29/2011  . Obesity, morbid (more than 100 lbs over ideal weight or BMI > 40)   . Edema   . Heart failure, diastolic, chronic     patient denies  . Hyperlipidemia, mixed     has DLBCL (diffuse large B cell lymphoma); HYPERLIPIDEMIA-MIXED; OBESITY-MORBID (>100'); ANEMIA; HYPERTENSION, UNSPECIFIED; DIASTOLIC HEART FAILURE, CHRONIC; EDEMA; Venous insufficiency; and Hx of adenomatous colonic polyps on his problem list.     is allergic to penicillins.  [name] had no medications administered during this  visit.  Past Surgical History  Procedure Laterality Date  . Multiple tooth extractions  04/2005  . Back surgery  2006    T6 vertebrae removed/titanium placed  . Lung biopsy  6/06  . Colonoscopy  11/24/2004    Polyps in the left colon ablated/removed  as described above.  Two submucosal lesions consistent with lipomas as described above not  manipulated./ Normal rectum  . Port a cath placement    . Colonoscopy  06/20/2012    Procedure: COLONOSCOPY;  Surgeon: Daneil Dolin, [name];  [facility];  Service: Endoscopy;  Laterality: N/A;  1:45  . Limbal stem cell transplant    . Port-a-cath removal  09/08/2012    Procedure: REMOVAL PORT-A-CATH;  Surgeon: Melrose Nakayama, [name];  Location: [facility];  Service: Thoracic;  Laterality: N/A;   Social History: He has been married for 32 years with 2 children   ROS: Denies any headaches, dizziness, double vision, fevers, chills, night sweats, nausea, vomiting, diarrhea, constipation, chest pain, heart palpitations, shortness of breath, blood in stool,  urinary pain, urinary burning, urinary frequency, hematuria. 14 point review of systems was performed and is negative except as detailed under history of present illness and above    PHYSICAL EXAMINATION  ECOG PERFORMANCE STATUS: 0 - Asymptomatic  Filed Vitals:   02/04/15 1015  BP: 146/57  Pulse: 88  Temp: 98.2 F (36.8 C)  Resp: 18    GENERAL:alert, no distress, well nourished, well developed, comfortable, cooperative, obese and smiling SKIN: skin color, texture, turgor are normal, no rashes or significant lesions HEAD: Normocephalic, No masses, lesions, tenderness or abnormalities EYES: normal, PERRLA, EOMI, Conjunctiva are pink and non-injected EARS: External ears normal OROPHARYNX:mucous membranes are moist  NECK: supple, no adenopathy, thyroid normal size, non-tender, without nodularity, no stridor, non-tender, trachea midline LYMPH:  no palpable lymphadenopathy, no hepatosplenomegaly BREAST:gynecomastia noted LUNGS: clear to auscultation and percussion HEART: regular rate & rhythm, no murmurs, no gallops, S1 normal and S2 normal ABDOMEN:abdomen soft, non-tender, obese, normal bowel sounds, no masses or organomegaly and body  habitus prohibits good abdominal exam BACK: Back symmetric, no curvature., No CVA tenderness EXTREMITIES:less then 2 second capillary refill, no joint deformities, effusion, or inflammation, no clubbing, no cyanosis NEURO: alert & oriented x 3 with fluent speech, no focal motor/sensory deficits, gait normal    LABORATORY DATA: CBC    Component Value Date/Time   WBC 3.9* 01/10/2015 0852   RBC 3.87* 01/10/2015 1023   RBC 4.05* 01/10/2015 0852   HGB 9.4* 01/10/2015 0852   HCT 31.7* 01/10/2015 0852   PLT 124* 01/10/2015 0852   MCV 78.3 01/10/2015 0852   MCH 23.2* 01/10/2015 0852   MCHC 29.7* 01/10/2015 0852   RDW 17.8* 01/10/2015 0852   LYMPHSABS 0.9 01/10/2015 0852   MONOABS 0.6 01/10/2015 0852   EOSABS 0.2 01/10/2015 0852   BASOSABS 0.0 01/10/2015 0852      Chemistry      Component Value Date/Time   NA 139 01/10/2015 0852   K 4.6 01/10/2015 0852   CL 108 01/10/2015 0852   CO2 24 01/10/2015 0852   BUN 15 01/10/2015 0852   CREATININE 1.09 01/10/2015 0852      Component Value Date/Time   CALCIUM 8.9 01/10/2015 0852   ALKPHOS 75 01/10/2015 0852   AST 24 01/10/2015 0852   ALT 25 01/10/2015 0852   BILITOT 0.5 01/10/2015 0852      Results for [name], [name] (MRN [id number]) as of 01/11/2014 10:12  Ref. Range 01/11/2014 09:09  LDH Latest Range: 94-250 U/L 217     ASSESSMENT:  Stage IV DLBCL Anemia HIstory of Colonic polyps  His lymphoma was back in 2006 and at this point he is considered cured. He did undergo a bone marrow transplant and therefore continuing with observation and blood counts is reasonable. He has a worsening anemia of uncertain etiology. I discussed this with him today. Have recommended additional laboratory studies and Hemoccult cards. His last colonoscopy was in October 2013. He has a history of colonic polyps although they have always been benign. Additional recommendations in regards to his anemia will be made once laboratory studies are available. He  will return in 3 weeks for additional review and recommendations regarding further follow-up.  We will make a referral back to GI.  Patient Active Problem List   Diagnosis Date Noted  . Hx of adenomatous colonic polyps 06/13/2012  . Venous insufficiency 04/29/2011  . DLBCL (diffuse large B cell lymphoma) 02/28/2009  . HYPERLIPIDEMIA-MIXED 02/28/2009  . OBESITY-MORBID (>100') 02/28/2009  . ANEMIA 02/28/2009  . HYPERTENSION, UNSPECIFIED 02/28/2009  . DIASTOLIC HEART FAILURE, CHRONIC 02/28/2009  . EDEMA 02/28/2009    All questions were answered. The patient knows to call the clinic with any problems, questions or concerns. We can certainly see the patient much sooner if necessary.   This document serves as a record of services personally performed by Ancil Linsey, [name]. It was created on her behalf by Janace Hoard, a trained medical scribe. The creation of this record is based on the scribe's personal observations and the provider's statements to them. This document has been checked and approved by the attending provider.  I have reviewed the above documentation for accuracy and completeness, and I agree with the above.  [name]

## 2015-02-04 NOTE — Patient Instructions (Addendum)
[place] at [facility] Discharge Instructions  RECOMMENDATIONS MADE BY THE CONSULTANT AND ANY TEST RESULTS WILL BE SENT TO YOUR REFERRING PHYSICIAN.  Exam and discussion by Dr. Whitney Muse Your iron level is very low and we will get you scheduled for 2 iron infusions. Referral to Dr. Gala Romney to evaluate your anemia.  Labs and office visit in 4 weeks.  Thank you for choosing [place] at [facility] to provide your oncology and hematology care.  To afford each patient quality time with our provider, please arrive at least 15 minutes before your scheduled appointment time.    You need to re-schedule your appointment should you arrive 10 or more minutes late.  We strive to give you quality time with our providers, and arriving late affects you and other patients whose appointments are after yours.  Also, if you no show three or more times for appointments you may be dismissed from the clinic at the providers discretion.     Again, thank you for choosing [facility].  Our hope is that these requests will decrease the amount of time that you wait before being seen by our physicians.       _____________________________________________________________  Should you have questions after your visit to [facility], please contact our office at [phone] between the hours of 8:30 a.m. and 4:30 p.m.  Voicemails left after 4:30 p.m. will not be returned until the following business day.  For prescription refill requests, have your pharmacy contact our office.    Iron Deficiency Anemia Anemia is when you have a low number of healthy red blood cells. It is often caused by too little iron. This is called iron deficiency anemia. It may make you tired and short of breath. HOME CARE   Take iron as told by your doctor.  Take vitamins as told by your doctor.  Eat foods that have iron in them. This includes liver, lean beef, whole-grain  bread, eggs, dried fruit, and dark green leafy vegetables. GET HELP RIGHT AWAY IF:  You pass out (faint).  You have chest pain.  You feel sick to your stomach (nauseous) or throw up (vomit).  You get very short of breath with activity.  You are weak.  You have a fast heartbeat.  You start to sweat for no reason.  You become light-headed when getting up from a chair or bed. MAKE SURE YOU:  Understand these instructions.  Will watch your condition.  Will get help right away if you are not doing well or get worse. Document Released: 09/12/2010 Document Revised: 08/15/2013 Document Reviewed: 04/17/2013 [facility] Patient Information 2015 [place], [place]. This information is not intended to replace advice given to you by your health care provider. Make sure you discuss any questions you have with your health care provider. Ferumoxytol injection What is this medicine? FERUMOXYTOL is an iron complex. Iron is used to make healthy red blood cells, which carry oxygen and nutrients throughout the body. This medicine is used to treat iron deficiency anemia in people with chronic kidney disease. This medicine may be used for other purposes; ask your health care provider or pharmacist if you have questions. COMMON BRAND NAME(S): Feraheme What should I tell my health care provider before I take this medicine? They need to know if you have any of these conditions: -anemia not caused by low iron levels -high levels of iron in the blood -magnetic resonance imaging (MRI) test scheduled -  an unusual or allergic reaction to iron, other medicines, foods, dyes, or preservatives -pregnant or trying to get pregnant -breast-feeding How should I use this medicine? This medicine is for injection into a vein. It is given by a health care professional in a hospital or clinic setting. Talk to your pediatrician regarding the use of this medicine in children. Special care may be needed. Overdosage: If you  think you've taken too much of this medicine contact a poison control center or emergency room at once. Overdosage: If you think you have taken too much of this medicine contact a poison control center or emergency room at once. NOTE: This medicine is only for you. Do not share this medicine with others. What if I miss a dose? It is important not to miss your dose. Call your doctor or health care professional if you are unable to keep an appointment. What may interact with this medicine? This medicine may interact with the following medications: -other iron products This list may not describe all possible interactions. Give your health care provider a list of all the medicines, herbs, non-prescription drugs, or dietary supplements you use. Also tell them if you smoke, drink alcohol, or use illegal drugs. Some items may interact with your medicine. What should I watch for while using this medicine? Visit your doctor or healthcare professional regularly. Tell your doctor or healthcare professional if your symptoms do not start to get better or if they get worse. You may need blood work done while you are taking this medicine. You may need to follow a special diet. Talk to your doctor. Foods that contain iron include: whole grains/cereals, dried fruits, beans, or peas, leafy green vegetables, and organ meats (liver, kidney). What side effects may I notice from receiving this medicine? Side effects that you should report to your doctor or health care professional as soon as possible: -allergic reactions like skin rash, itching or hives, swelling of the face, lips, or tongue -breathing problems -changes in blood pressure -feeling faint or lightheaded, falls -fever or chills -flushing, sweating, or hot feelings -swelling of the ankles or feet Side effects that usually do not require medical attention (Report these to your doctor or health care professional if they continue or are  bothersome.): -diarrhea -headache -nausea, vomiting -stomach pain This list may not describe all possible side effects. Call your doctor for medical advice about side effects. You may report side effects to FDA at 1-800-FDA-1088. Where should I keep my medicine? This drug is given in a hospital or clinic and will not be stored at home. NOTE: This sheet is a summary. It may not cover all possible information. If you have questions about this medicine, talk to your doctor, pharmacist, or health care provider.  2015, Elsevier/Gold Standard. (2012-03-25 15:23:36)

## 2015-02-05 ENCOUNTER — Encounter: Payer: Self-pay | Admitting: Internal Medicine

## 2015-02-08 ENCOUNTER — Encounter (HOSPITAL_COMMUNITY): Payer: Self-pay

## 2015-02-08 ENCOUNTER — Encounter (HOSPITAL_BASED_OUTPATIENT_CLINIC_OR_DEPARTMENT_OTHER): Payer: Medicare Other

## 2015-02-08 DIAGNOSIS — D649 Anemia, unspecified: Secondary | ICD-10-CM | POA: Diagnosis present

## 2015-02-08 MED ORDER — SODIUM CHLORIDE 0.9 % IV SOLN
510.0000 mg | INTRAVENOUS | Status: DC
  Administered 2015-02-08: 510 mg via INTRAVENOUS
  Filled 2015-02-08: qty 17

## 2015-02-08 MED ORDER — SODIUM CHLORIDE 0.9 % IV SOLN
INTRAVENOUS | Status: DC
  Administered 2015-02-08: 14:00:00 via INTRAVENOUS

## 2015-02-08 NOTE — Patient Instructions (Signed)
[facility] at [facility] Discharge Instructions  RECOMMENDATIONS MADE BY THE CONSULTANT AND ANY TEST RESULTS WILL BE SENT TO YOUR REFERRING PHYSICIAN.  Iron infusion today Follow up as scheduled Please call the clinic if you have any questions or concerns  Thank you for choosing [facility] at [facility] to provide your oncology and hematology care.  To afford each patient quality time with our provider, please arrive at least 15 minutes before your scheduled appointment time.    You need to re-schedule your appointment should you arrive 10 or more minutes late.  We strive to give you quality time with our providers, and arriving late affects you and other patients whose appointments are after yours.  Also, if you no show three or more times for appointments you may be dismissed from the clinic at the providers discretion.     Again, thank you for choosing [facility].  Our hope is that these requests will decrease the amount of time that you wait before being seen by our physicians.       _____________________________________________________________  Should you have questions after your visit to [facility], please contact our office at [phone] between the hours of 8:30 a.m. and 4:30 p.m.  Voicemails left after 4:30 p.m. will not be returned until the following business day.  For prescription refill requests, have your pharmacy contact our office.    

## 2015-02-08 NOTE — Progress Notes (Signed)
[name] Card Tolerated iron infusion well Discharged ambulatory

## 2015-02-13 ENCOUNTER — Encounter (HOSPITAL_COMMUNITY): Payer: Self-pay | Admitting: Hematology & Oncology

## 2015-02-15 ENCOUNTER — Encounter (HOSPITAL_COMMUNITY): Payer: Self-pay

## 2015-02-15 ENCOUNTER — Encounter (HOSPITAL_BASED_OUTPATIENT_CLINIC_OR_DEPARTMENT_OTHER): Payer: Medicare Other

## 2015-02-15 VITALS — BP 135/62 | HR 68 | Temp 98.3°F | Resp 18

## 2015-02-15 DIAGNOSIS — D508 Other iron deficiency anemias: Secondary | ICD-10-CM

## 2015-02-15 DIAGNOSIS — D649 Anemia, unspecified: Secondary | ICD-10-CM

## 2015-02-15 MED ORDER — SODIUM CHLORIDE 0.9 % IV SOLN
Freq: Once | INTRAVENOUS | Status: AC
  Administered 2015-02-15: 14:00:00 via INTRAVENOUS

## 2015-02-15 MED ORDER — SODIUM CHLORIDE 0.9 % IV SOLN
510.0000 mg | Freq: Once | INTRAVENOUS | Status: AC
  Administered 2015-02-15: 510 mg via INTRAVENOUS
  Filled 2015-02-15: qty 17

## 2015-02-15 MED ORDER — SODIUM CHLORIDE 0.9 % IJ SOLN: 10.0000 mL | Freq: Once | INTRAMUSCULAR | Status: DC

## 2015-02-15 NOTE — Progress Notes (Signed)
..  [name] presented today for iv iron, tolerated well

## 2015-02-21 ENCOUNTER — Telehealth: Payer: Self-pay

## 2015-02-21 ENCOUNTER — Encounter: Payer: Self-pay | Admitting: Nurse Practitioner

## 2015-02-21 ENCOUNTER — Ambulatory Visit (INDEPENDENT_AMBULATORY_CARE_PROVIDER_SITE_OTHER): Payer: Medicare Other | Admitting: Nurse Practitioner

## 2015-02-21 ENCOUNTER — Other Ambulatory Visit: Payer: Self-pay

## 2015-02-21 VITALS — BP 143/79 | HR 86 | Temp 97.6°F | Ht 71.0 in | Wt 298.0 lb

## 2015-02-21 DIAGNOSIS — Z86010 Personal history of colonic polyps: Secondary | ICD-10-CM | POA: Diagnosis not present

## 2015-02-21 DIAGNOSIS — D508 Other iron deficiency anemias: Secondary | ICD-10-CM | POA: Diagnosis not present

## 2015-02-21 DIAGNOSIS — D509 Iron deficiency anemia, unspecified: Secondary | ICD-10-CM

## 2015-02-21 MED ORDER — PEG 3350-KCL-NA BICARB-NACL 420 G PO SOLR: 4000.0000 mL | Freq: Once | ORAL | Status: DC

## 2015-02-21 NOTE — Patient Instructions (Signed)
1. We'll schedule your procedures for you. 2. Further recommendations to be based on results your procedures. 3. Return for follow-up in 2 months.

## 2015-02-21 NOTE — Progress Notes (Signed)
Referring Provider: Patrici Ranks, MD Primary Care Physician:  [name], MD Primary GI:  Dr. Gala Romney  Chief Complaint  Patient presents with  . Anemia    HPI:   64 year old male referred to [place] by oncology for persistent iron deficiency anemia. Patient has a diagnosis of non-Hodgkin's lymphoma which was treated with a bone marrow transplant and at this point he is considered cured. He continues to see oncology for observation and blood counts. They're unsure of the etiology of his current anemia. Patient's last colonoscopy was completed on 06/20/2012 for surveillance due to history of colon adenomas. Findings included suboptimal prep and multiple polyps removed including a 6 mm polyp at the anal verge, multiple small hyperplastic diminutive polyps in the sigmoid segment, diminutive polyp at the base of cecum, and one at the ileocecal valve. Noted 1.57 m yellowish submucosal nodule soft just distal to the ileocecal valve consistent with prior sedation appearing to be a lipoma. Impression was multiple rectal: Polyps treated and removed. Pathology showed benign polypoid colorectal mucosa with lymphoid aggregates as well as hyperplastic polyps. Recommended 5 year repeat colonoscopy (2018). Most recent labs completed by oncology on 01/10/2015 included hemoglobin of 9.4, hematocrit of 31.7, platelets of 124. Stool for occult blood cards 3 were all negative.  Today he states his current issues with anemia started a couple months ago. Is having increased fatigue, weakness, and dyspnea. Denies dizziness and chest pain. Has more gas but is taking more fiber. Denies abdominal pain, N/V, hematochezia. When asked about melena he states "not really, have had a couple episodes of darker stools but it clears up after a couple days." Last incidence of dark stools was about 3 months ago. Denies GERD symptoms. Takes Aleve "every now and then" for knee pain. Denies ASA powders. Denies fever, chills,  unintentional weight loss, dizziness, lightheadedness, syncope, near syncope. Denies any other upper or lower GI symptoms.   Past Medical History  Diagnosis Date  . Hypertension   . Asthma   . 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 Venous insufficiency 04/29/2011  . Obesity, morbid (more than 100 lbs over ideal weight or BMI > 40)   . Edema   . Heart failure, diastolic, chronic     patient denies  . Hyperlipidemia, mixed     Past Surgical History  Procedure Laterality Date  . Multiple tooth extractions  04/2005  . Back surgery  2006    T6 vertebrae removed/titanium placed  . Lung biopsy  6/06  . Colonoscopy  11/24/2004    Polyps in the left colon ablated/removed as described above.  Two submucosal lesions consistent with lipomas as described above not  manipulated./ Normal rectum  . Port a cath placement    . Colonoscopy  06/20/2012    MULTIPLE RECTAL AND COLONIC POLYPS  . Limbal stem cell transplant    . Port-a-cath removal  09/08/2012    Procedure: REMOVAL PORT-A-CATH;  Surgeon: Melrose Nakayama, MD;  Location: [place];  Service: Thoracic;  Laterality: N/A;    Current Outpatient Prescriptions  Medication Sig Dispense Refill  . acetaminophen (TYLENOL) 500 MG tablet Take 500 mg by mouth every 6 (six) hours as needed for pain.    . baclofen (LIORESAL) 10 MG tablet Take 10 mg by mouth 3 (three) times daily as needed. For hand cramps    . hydrochlorothiazide (HYDRODIURIL) 25 MG tablet Take 25 mg by mouth daily.  0  . potassium  chloride (K-DUR,KLOR-CON) 10 MEQ tablet Take 1 tablet by mouth 3 (three) times a week.  0  . Homeopathic Products (ALLERGY MEDICINE PO) Take 1 capsule by mouth as needed.    Marland Kitchen ibuprofen (ADVIL,MOTRIN) 200 MG tablet Take 200 mg by mouth every 6 (six) hours as needed. Use seldom. For pain     No current facility-administered medications for this visit.    Allergies as of 02/21/2015 - Review Complete 02/21/2015  Allergen  Reaction Noted  . Penicillins Rash 04/29/2011    Family History  Problem Relation Age of Onset  . Cancer Mother     lung  . Cancer Father     prostate  . Colon cancer Neg Hx     History   Social History  . Marital Status: Married    Spouse Name: N/A  . Number of Children: 2  . Years of Education: N/A   Occupational History  . disability due to back    Social History Main Topics  . Smoking status: Former Smoker -- 0.50 packs/day for 15 years    Quit date: 11/11/2011  . Smokeless tobacco: Never Used  . Alcohol Use: No  . Drug Use: No  . Sexual Activity: Not on file   Other Topics Concern  . None   Social History Narrative   Married   No regular exercise    Review of Systems: General: Negative for anorexia, weight loss, fever, chills. Admits increasing fatigue, weakness. Eyes: Negative for vision changes.  ENT: Negative for hoarseness, difficulty swallowing . CV: Negative for chest pain, angina, palpitations, peripheral edema.  Respiratory: Negative for cough, sputum, wheezing.  GI: See history of present illness. Derm: Negative for rash or itching.  Endo: Negative for unusual weight change.  Heme: Negative for bruising or bleeding. Allergy: Negative for rash or hives.   Physical Exam: BP 143/79 mmHg  Pulse 86  Temp(Src) 97.6 F (36.4 C) (Oral)  Ht '5\' 11"'$  (1.803 m)  Wt 298 lb (135.172 kg)  BMI 41.58 kg/m2 General:   Alert and oriented. Pleasant and cooperative. Well-nourished and well-developed. Appears tired. Head:  Normocephalic and atraumatic. Eyes:  Without icterus, sclera clear and conjunctiva pink.  Ears:  Normal auditory acuity. Cardiovascular:  S1, S2 present without murmurs appreciated. Normal pulses noted. Extremities without clubbing or edema. Respiratory:  Clear to auscultation bilaterally. No wheezes, rales, or rhonchi. No distress.  Gastrointestinal:  +BS, soft, non-tender and non-distended. No HSM noted. No guarding or rebound. No masses  appreciated.  Rectal:  Deferred  Skin:  Intact without significant lesions or rashes noted. Neurologic:  Alert and oriented x4;  grossly normal neurologically. Psych:  Alert and cooperative. Normal mood and affect. Heme/Lymph/Immune: No excessive bruising noted.    02/21/2015 9:30 AM

## 2015-02-21 NOTE — Telephone Encounter (Signed)
Pt insurance doesn't require pre- certification for [place] [phone])

## 2015-03-01 ENCOUNTER — Other Ambulatory Visit (HOSPITAL_COMMUNITY): Payer: Self-pay

## 2015-03-01 NOTE — Assessment & Plan Note (Addendum)
64 year old male with a history of non-Hodgkin's lymphoma which is cured after bone marrow transplant. Has had a recurrent new onset of anemia which is evaluated by oncology and they're unsure the etiology at this point. Patient does have a history of colonic adenomas, his last colonoscopy was completed in 2013 with multiple polyp removal which were found to be hyperplastic. His baseline hemoglobin appears to be between 12 and 13 however his last hemoglobin 1 month ago was significantly lower at 9.6. At this point given his history and new onset anemia we will plan to evaluate him further with a colonoscopy and endoscopy with possible capsule study if needed to complete workup for GI etiology. We'll have him return in 2 months for further evaluation.  Proceed with TCS and EGD +/- capsule endoscopy with Dr. Gala Romney in near future: the risks, benefits, and alternatives have been discussed with the patient in detail. The patient states understanding and desires to proceed.  The patient is not on any anticoagulants, antidepressants, chronic pain medicines, anxiolytics. His last procedure 2-3 years ago was completed with conscious sedation without issue. Conscious sedation is likely adequate for his procedure.

## 2015-03-01 NOTE — Assessment & Plan Note (Signed)
Patient with a new onset of acute anemia with hemoglobin 9.6 which is substantially lower from his baseline of 12-13. Has a history of colonic adenomas. His last colonoscopy was in 2013 which had poor prep and multiple polyps removed which were generally found to be hyperplastic. Recommended 5 year repeat colonoscopy which 2018. However given his oncology evaluation with new onset anemia of unknown etiology we will proceed with colonoscopy and endoscopy with possible capsule study to further evaluate possible GI etiology for his anemia. We will have him return to the clinic in 2 months for further evaluation after his procedure.  Proceed with TCS and EGD +/- capsule endoscopy with Dr. Gala Romney in near future: the risks, benefits, and alternatives have been discussed with the patient in detail. The patient states understanding and desires to proceed.  The patient is not on any anticoagulants, antidepressants, chronic pain medicines, anxiolytics. His last procedure 2-3 years ago was completed with conscious sedation without issue. Conscious sedation is likely adequate for his procedure.

## 2015-03-05 ENCOUNTER — Encounter (HOSPITAL_COMMUNITY): Payer: Self-pay | Admitting: Hematology & Oncology

## 2015-03-05 ENCOUNTER — Other Ambulatory Visit: Payer: Self-pay

## 2015-03-05 ENCOUNTER — Encounter (HOSPITAL_COMMUNITY): Payer: Medicare Other | Attending: Hematology & Oncology | Admitting: Hematology & Oncology

## 2015-03-05 ENCOUNTER — Encounter (HOSPITAL_BASED_OUTPATIENT_CLINIC_OR_DEPARTMENT_OTHER): Payer: Medicare Other

## 2015-03-05 VITALS — BP 137/41 | HR 48 | Temp 98.0°F | Resp 18 | Wt 305.2 lb

## 2015-03-05 DIAGNOSIS — D649 Anemia, unspecified: Secondary | ICD-10-CM | POA: Diagnosis not present

## 2015-03-05 DIAGNOSIS — D508 Other iron deficiency anemias: Secondary | ICD-10-CM

## 2015-03-05 DIAGNOSIS — C8330 Diffuse large B-cell lymphoma, unspecified site: Secondary | ICD-10-CM | POA: Diagnosis not present

## 2015-03-05 DIAGNOSIS — C8590 Non-Hodgkin lymphoma, unspecified, unspecified site: Secondary | ICD-10-CM | POA: Diagnosis not present

## 2015-03-05 DIAGNOSIS — I499 Cardiac arrhythmia, unspecified: Secondary | ICD-10-CM

## 2015-03-05 DIAGNOSIS — D509 Iron deficiency anemia, unspecified: Secondary | ICD-10-CM

## 2015-03-05 DIAGNOSIS — I440 Atrioventricular block, first degree: Secondary | ICD-10-CM

## 2015-03-05 LAB — CBC WITH DIFFERENTIAL/PLATELET
Basophils Absolute: 0 10*3/uL (ref 0.0–0.1)
Basophils Relative: 1 % (ref 0–1)
EOS PCT: 5 % (ref 0–5)
Eosinophils Absolute: 0.2 10*3/uL (ref 0.0–0.7)
HCT: 36.7 % — ABNORMAL LOW (ref 39.0–52.0)
HEMOGLOBIN: 11.6 g/dL — AB (ref 13.0–17.0)
LYMPHS PCT: 22 % (ref 12–46)
Lymphs Abs: 0.9 10*3/uL (ref 0.7–4.0)
MCH: 26.4 pg (ref 26.0–34.0)
MCHC: 31.6 g/dL (ref 30.0–36.0)
MCV: 83.6 fL (ref 78.0–100.0)
MONOS PCT: 13 % — AB (ref 3–12)
Monocytes Absolute: 0.5 10*3/uL (ref 0.1–1.0)
NEUTROS PCT: 59 % (ref 43–77)
Neutro Abs: 2.3 10*3/uL (ref 1.7–7.7)
Platelets: 187 10*3/uL (ref 150–400)
RBC: 4.39 MIL/uL (ref 4.22–5.81)
RDW: 26.8 % — AB (ref 11.5–15.5)
WBC: 3.9 10*3/uL — ABNORMAL LOW (ref 4.0–10.5)

## 2015-03-05 LAB — IRON AND TIBC
Iron: 57 ug/dL (ref 45–182)
Saturation Ratios: 16 % — ABNORMAL LOW (ref 17.9–39.5)
TIBC: 354 ug/dL (ref 250–450)
UIBC: 297 ug/dL

## 2015-03-05 LAB — FERRITIN: Ferritin: 83 ng/mL (ref 24–336)

## 2015-03-05 NOTE — Progress Notes (Signed)
CC'ED TO REFERRING

## 2015-03-05 NOTE — Progress Notes (Signed)
[name], MD [place] [address]  Diffuse large B cell lymphoma, Stage IV disease Treated in 2006  CURRENT THERAPY: Surveillance  INTERVAL HISTORY: [name] 64 y.o. male returns for  regular  visit for followup of Diffuse large B-cell lymphoma presenting with extensive stage IV a disease including thoracic vertebral body involvement with biopsy on 02/04/2005. He had surgical stabilization of his thoracic spine followed by chemotherapy with R. CHOP. He went on to have ICE chemotherapy pre-transplant followed by total body irradiation followed by stem cell transplant on 01/15/2006. This was an autologous stem cell transplant. We then maintain him on rituximab for 2 years. He did not get a complete remission with the R. CHOP chemotherapy initially. His last PET scan was in November 2011.  He is present alone today and says he is doing fine. Colonoscopy done in 2013 with Dr. Gala Romney.   Previously had a Fissure, no extreme bleeding now. He notes that approximately 8 months ago, he had thick black stools  At our last visit he was noted to have a new anemia and iron deficiency. He has received IV iron.His energy levels are better though he is still not to 100%. He felt better after his second iron infusion, not the first. We have sent him back to Dr. Gala Romney for additional GI workup.  This Friday, 7/15, he will be having a pill cam done.   Oncology History   S/P chemotherapy following stabilization of the spine.  Initial biopsy was on 07/13/2005 on his bone marrow.  Thoracic vertebra was biopsied on 02/04/2005 and mediastinum on 02/03/2005.  S/P R-CHOP and achieved an incomplete response, then underwent ICE chemotherapy pre-transplant and total body radiation with autologous stem cell transplant on 01/15/2006 and maintained on Rituxan for 2 years.  Thus far without recurrence.     DLBCL (diffuse large B cell lymphoma)   01/27/2005 Imaging PET- diffuse activity   involving the L cervical area, extensive activity in the mediastinumn particularly in the R paratracheal area and to a lesser degree in the anterior mediastinum.  Lg area in porta caval nodes in upper abd and diffuse bone activity   02/03/2005 Pathology Results Mediastinal lymph node demonstrating diffuse large cell lymphoma, B-type.  Vertebral bone biopsy demonstrating diffuse large b-cell lymphoma   03/11/2005 - 08/04/2005 Chemotherapy R-CHOP x 8 cycles   05/04/2005 Imaging PET- near resolution of previously identified uptake.   07/13/2005 Bone Marrow Biopsy Bone marrow aspiration and biopsy is negative for lymphoma   08/31/2005 Imaging PET- further response to therapy with findings consistent with residual disease.  Hypermetabolic activity identified within the anterior mediastinal lymph node (smaller but still pathologic).  Borderline hypermetabolic small right pleural effusion   10/20/2005 - 11/13/2005 Chemotherapy R-ICE x 2 [facility] at [facility]   01/08/2006 - 01/09/2006 Chemotherapy Cyclophosphamide 600 mg daily x 2   01/10/2006 - 01/14/2006 Radiation Therapy Total body radiation twice daily   01/15/2006 Bone Marrow Transplant Stem cell transplant at [facility]   04/05/2006 - 12/05/2007 Chemotherapy Rituxan weekly x 4 every 6 months x 2 years    Past Medical History  Diagnosis Date  . Hypertension   . Asthma   . 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 Venous insufficiency 04/29/2011  . Obesity, morbid (more than 100 lbs over ideal weight or BMI > 40)   . Edema   . Heart failure, diastolic, chronic     patient denies  .  Hyperlipidemia, mixed     has DLBCL (diffuse large B cell lymphoma); HYPERLIPIDEMIA-MIXED; OBESITY-MORBID (>100'); Anemia; HYPERTENSION, UNSPECIFIED; DIASTOLIC HEART FAILURE, CHRONIC; EDEMA; Venous insufficiency; Hx of adenomatous colonic polyps; Mucosal abnormality of stomach; History of colonic polyps; and IDA (iron deficiency anemia) on his problem list.      is allergic to penicillins.  [name] does not currently have medications on file.  Past Surgical History  Procedure Laterality Date  . Multiple tooth extractions  04/2005  . Back surgery  2006    T6 vertebrae removed/titanium placed  . Lung biopsy  6/06  . Colonoscopy  11/24/2004    Polyps in the left colon ablated/removed as described above.  Two submucosal lesions consistent with lipomas as described above not  manipulated./ Normal rectum  . Port a cath placement    . Colonoscopy  06/20/2012    MULTIPLE RECTAL AND COLONIC POLYPS  . Limbal stem cell transplant    . Port-a-cath removal  09/08/2012    Procedure: REMOVAL PORT-A-CATH;  Surgeon: Melrose Nakayama, MD;  Location: [place] [facility];  Service: Thoracic;  Laterality: N/A;  . Colonoscopy N/A 03/08/2015    Procedure: COLONOSCOPY;  Surgeon: Daneil Dolin, MD;  [facility];  Service: Endoscopy;  Laterality: N/A;  930  . Esophagogastroduodenoscopy N/A 03/08/2015    Procedure: ESOPHAGOGASTRODUODENOSCOPY (EGD);  Surgeon: Daneil Dolin, MD;  [facility];  Service: Endoscopy;  Laterality: N/A;  . Givens capsule study N/A 03/08/2015    Procedure: GIVENS CAPSULE STUDY;  Surgeon: Daneil Dolin, MD;  [facility];  Service: Endoscopy;  Laterality: N/A;   Social History: He has been married for 32 years with 2 children   ROS: Denies any headaches, dizziness, double vision, fevers, chills, night sweats, nausea, vomiting, diarrhea, constipation, chest pain, heart palpitations, shortness of breath, blood in stool,  urinary pain, urinary burning, urinary frequency, hematuria. Positive for malaise/fatigue and shortness of breath. 14 point review of systems was performed and is negative except as detailed under history of present illness and above    PHYSICAL EXAMINATION  ECOG PERFORMANCE STATUS: 0 - Asymptomatic  Filed Vitals:   03/05/15 1338  BP:   Pulse: 48  Temp:   Resp:     GENERAL:alert, no  distress, well nourished, well developed, comfortable, cooperative, obese and smiling SKIN: skin color, texture, turgor are normal, no rashes or significant lesions HEAD: Normocephalic, No masses, lesions, tenderness or abnormalities EYES: normal, PERRLA, EOMI, Conjunctiva are pink and non-injected EARS: External ears normal OROPHARYNX:mucous membranes are moist  NECK: supple, no adenopathy, thyroid normal size, non-tender, without nodularity, no stridor, non-tender, trachea midline LYMPH:  no palpable lymphadenopathy, no hepatosplenomegaly BREAST:gynecomastia noted LUNGS: clear to auscultation and percussion HEART: no murmurs, no gallops, S1 normal and S2 normal Irregular ABDOMEN:abdomen soft, non-tender, obese, normal bowel sounds, no masses or organomegaly and body habitus prohibits good abdominal exam BACK: Back symmetric, no curvature., No CVA tenderness EXTREMITIES:less then 2 second capillary refill, no joint deformities, effusion, or inflammation, no clubbing, no cyanosis NEURO: alert & oriented x 3 with fluent speech, no focal motor/sensory deficits, gait normal   LABORATORY DATA: CBC    Component Value Date/Time   WBC 3.9* 03/05/2015 1258   RBC 4.39 03/05/2015 1258   RBC 3.87* 01/10/2015 1023   HGB 11.6* 03/05/2015 1258   HCT 36.7* 03/05/2015 1258   PLT 187 03/05/2015 1258   MCV 83.6 03/05/2015 1258   MCH 26.4 03/05/2015 1258   MCHC 31.6  03/05/2015 1258   RDW 26.8* 03/05/2015 1258   LYMPHSABS 0.9 03/05/2015 1258   MONOABS 0.5 03/05/2015 1258   EOSABS 0.2 03/05/2015 1258   BASOSABS 0.0 03/05/2015 1258      Chemistry      Component Value Date/Time   NA 140 03/06/2015 1620   K 3.1* 03/06/2015 1620   CL 104 03/06/2015 1620   CO2 27 03/06/2015 1620   BUN 13 03/06/2015 1620   CREATININE 1.19 03/06/2015 1620      Component Value Date/Time   CALCIUM 8.5* 03/06/2015 1620   ALKPHOS 75 01/10/2015 0852   AST 24 01/10/2015 0852   ALT 25 01/10/2015 0852   BILITOT 0.5  01/10/2015 0852      Results for Hoose, [name] (MRN [id number]) as of 01/11/2014 10:12  Ref. Range 01/11/2014 09:09  LDH Latest Range: 94-250 U/L 217     ASSESSMENT:  Stage IV DLBCL Anemia HIstory of Colonic polyps Iron deficiency with serum ferritin of 11 ng/ml First Degree AV block  His lymphoma was back in 2006 and at this point he is considered cured. He did undergo a bone marrow transplant and therefore continuing with observation and blood counts is reasonable  He has been diagnosed with iron deficiency and has received IV iron with improvement in his counts. Heme occult cards were negative 3. He had developed a significant anemia with a hemoglobin of 9.4 and a serum ferritin of 11. He is working with gastroenterology and has a PillCam scheduled this upcoming Friday.  EKG was performed given his ectopy on exam and showed a first degree AV block, incomplete R bundle branch block. He will return to see [place] again in 6 weeks with additional lab work. He currently does not follow with cardiology. He is not opposed to a cardiology consultation and given his history of transplantation and TBI I feel he needs to proceed with a cardiology visit.   Patient Active Problem List   Diagnosis Date Noted  . IDA (iron deficiency anemia)   . Mucosal abnormality of stomach   . History of colonic polyps   . Hx of adenomatous colonic polyps 06/13/2012  . Venous insufficiency 04/29/2011  . DLBCL (diffuse large B cell lymphoma) 02/28/2009  . HYPERLIPIDEMIA-MIXED 02/28/2009  . OBESITY-MORBID (>100') 02/28/2009  . Anemia 02/28/2009  . HYPERTENSION, UNSPECIFIED 02/28/2009  . DIASTOLIC HEART FAILURE, CHRONIC 02/28/2009  . EDEMA 02/28/2009    All questions were answered. The patient knows to call the clinic with any problems, questions or concerns. We can certainly see the patient much sooner if necessary.   This document serves as a record of services personally performed by Ancil Linsey, MD. It  was created on her behalf by Arlyce Harman, a trained medical scribe. The creation of this record is based on the scribe's personal observations and the provider's statements to them. This document has been checked and approved by the attending provider.  I have reviewed the above documentation for accuracy and completeness, and I agree with the above.  Kelby Fam. Penland MD

## 2015-03-05 NOTE — Progress Notes (Signed)
CC'ED TO PCP 

## 2015-03-05 NOTE — Patient Instructions (Signed)
[place] at [facility] Discharge Instructions  RECOMMENDATIONS MADE BY THE CONSULTANT AND ANY TEST RESULTS WILL BE SENT TO YOUR REFERRING PHYSICIAN.  Exam and discussion by Dr. Whitney Muse Your heart has an irregular rhythm and we need to make a referral to cardiology.  You can still have your GI procedure as scheduled. Will let you know if any concerns with your blood work today.  Follow-up in 6 weeks with labs and office visits.  Thank you for choosing [place] at [facility] to provide your oncology and hematology care.  To afford each patient quality time with our provider, please arrive at least 15 minutes before your scheduled appointment time.    You need to re-schedule your appointment should you arrive 10 or more minutes late.  We strive to give you quality time with our providers, and arriving late affects you and other patients whose appointments are after yours.  Also, if you no show three or more times for appointments you may be dismissed from the clinic at the providers discretion.     Again, thank you for choosing [facility].  Our hope is that these requests will decrease the amount of time that you wait before being seen by our physicians.       _____________________________________________________________  Should you have questions after your visit to [facility], please contact our office at [phone] between the hours of 8:30 a.m. and 4:30 p.m.  Voicemails left after 4:30 p.m. will not be returned until the following business day.  For prescription refill requests, have your pharmacy contact our office.

## 2015-03-05 NOTE — Progress Notes (Signed)
Labs drawn

## 2015-03-06 ENCOUNTER — Ambulatory Visit (INDEPENDENT_AMBULATORY_CARE_PROVIDER_SITE_OTHER): Payer: Medicare Other | Admitting: Cardiovascular Disease

## 2015-03-06 ENCOUNTER — Encounter: Payer: Self-pay | Admitting: Cardiovascular Disease

## 2015-03-06 ENCOUNTER — Other Ambulatory Visit (HOSPITAL_COMMUNITY)
Admission: RE | Admit: 2015-03-06 | Discharge: 2015-03-06 | Disposition: A | Discharge status: Home / Self Care | Payer: Medicare Other | Source: Ambulatory Visit | Attending: Cardiovascular Disease | Admitting: Cardiovascular Disease

## 2015-03-06 VITALS — BP 132/74 | HR 83 | Ht 71.5 in | Wt 300.8 lb

## 2015-03-06 DIAGNOSIS — I498 Other specified cardiac arrhythmias: Secondary | ICD-10-CM | POA: Diagnosis not present

## 2015-03-06 DIAGNOSIS — K635 Polyp of colon: Secondary | ICD-10-CM | POA: Diagnosis not present

## 2015-03-06 DIAGNOSIS — R0609 Other forms of dyspnea: Secondary | ICD-10-CM

## 2015-03-06 DIAGNOSIS — K317 Polyp of stomach and duodenum: Secondary | ICD-10-CM

## 2015-03-06 DIAGNOSIS — Z86010 Personal history of colonic polyps: Secondary | ICD-10-CM

## 2015-03-06 DIAGNOSIS — K3189 Other diseases of stomach and duodenum: Secondary | ICD-10-CM

## 2015-03-06 DIAGNOSIS — R5383 Other fatigue: Secondary | ICD-10-CM

## 2015-03-06 DIAGNOSIS — I517 Cardiomegaly: Secondary | ICD-10-CM

## 2015-03-06 DIAGNOSIS — I493 Ventricular premature depolarization: Secondary | ICD-10-CM

## 2015-03-06 DIAGNOSIS — M7989 Other specified soft tissue disorders: Secondary | ICD-10-CM | POA: Diagnosis not present

## 2015-03-06 LAB — BASIC METABOLIC PANEL
Anion gap: 9 (ref 5–15)
BUN: 13 mg/dL (ref 6–20)
CO2: 27 mmol/L (ref 22–32)
Calcium: 8.5 mg/dL — ABNORMAL LOW (ref 8.9–10.3)
Chloride: 104 mmol/L (ref 101–111)
Creatinine, Ser: 1.19 mg/dL (ref 0.61–1.24)
GFR calc Af Amer: >60 mL/min (ref >60)
GFR calc non Af Amer: >60 mL/min (ref >60)
GLUCOSE: 105 mg/dL — AB (ref 65–99)
Potassium: 3.1 mmol/L — ABNORMAL LOW (ref 3.5–5.1)
SODIUM: 140 mmol/L (ref 135–145)

## 2015-03-06 LAB — MAGNESIUM: MAGNESIUM: 2.1 mg/dL (ref 1.7–2.4)

## 2015-03-06 NOTE — Patient Instructions (Signed)
Your physician recommends that you schedule a follow-up appointment in: 4-6 weeks with Arnold Long NP  Please get labs BMET,Magnesium   Your physician has requested that you have a lexiscan myoview. For further information please visit [email or web address]. Please follow instruction sheet, as given.   Your physician has requested that you have an echocardiogram. Echocardiography is a painless test that uses sound waves to create images of your heart. It provides your doctor with information about the size and shape of your heart and how well your heart's chambers and valves are working. This procedure takes approximately one hour. There are no restrictions for this procedure.    Thank you for choosing [place] !

## 2015-03-06 NOTE — Progress Notes (Signed)
Patient ID: [name], male   DOB: 10-13-1950, 64 y.o.   MRN: [id number]       CARDIOLOGY CONSULT NOTE  Patient ID: [name] MRN: [id number] DOB/AGE: [birth year] 63 y.o.  Admit date: (Not on file) Primary Physician [name], MD  Reason for Consultation: arrhythmia  HPI: The patient is a 64 year old male with a history of diffuse large B-cell lymphoma who previously underwent chemotherapy and total body radiation followed by stem cell transplant in May 2007. He is considered cured from this standpoint according to Dr. Whitney Muse. He has an anemia of uncertain etiology which is also being monitored. During an office visit on 03/05/15, his heart rhythm was noted to be irregular. He underwent an ECG which demonstrated normal sinus rhythm with first-degree AV block, PR interval 220 ms, incomplete right bundle-branch block, QRS duration 114 ms, PVCs, and left ventricular hypertrophy with repolarization abnormalities.  He denies chest pain. He has had leg edema for several years and takes hydrochlorothiazide. 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 He used to be able to go "non stop". He did not experience this last year. He denies orthopnea and paroxysmal nocturnal dyspnea, as well as lightheadedness and dizziness.  An echocardiogram on 11/06/2008 demonstrated normal left ventricular systolic function and regional wall motion with mild LVH and mild RVH.  CBC on 7/12 showed hemoglobin 11.6, blood cells 3.9, platelets 187.  Allergies  Allergen Reactions  . Penicillins Rash    Current Outpatient Prescriptions  Medication Sig Dispense Refill  . baclofen (LIORESAL) 10 MG tablet Take 10 mg by mouth 3 (three) times daily as needed. For hand cramps    . hydrochlorothiazide (HYDRODIURIL) 25 MG tablet Take 25 mg by mouth daily.  0  . ibuprofen (ADVIL,MOTRIN) 200 MG tablet Take 200 mg by mouth every 6 (six) hours as needed. Use seldom. For  pain    . potassium chloride (K-DUR,KLOR-CON) 10 MEQ tablet Take 1 tablet by mouth 3 (three) times a week.  0   No current facility-administered medications for this visit.    Past Medical History  Diagnosis Date  . Hypertension   . Asthma   . 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 Venous insufficiency 04/29/2011  . Obesity, morbid (more than 100 lbs over ideal weight or BMI > 40)   . Edema   . Heart failure, diastolic, chronic     patient denies  . Hyperlipidemia, mixed     Past Surgical History  Procedure Laterality Date  . Multiple tooth extractions  04/2005  . Back surgery  2006    T6 vertebrae removed/titanium placed  . Lung biopsy  6/06  . Colonoscopy  11/24/2004    Polyps in the left colon ablated/removed as described above.  Two submucosal lesions consistent with lipomas as described above not  manipulated./ Normal rectum  . Port a cath placement    . Colonoscopy  06/20/2012    MULTIPLE RECTAL AND COLONIC POLYPS  . Limbal stem cell transplant    . Port-a-cath removal  09/08/2012    Procedure: REMOVAL PORT-A-CATH;  Surgeon: Melrose Nakayama, MD;  Location: [place];  Service: Thoracic;  Laterality: N/A;    History   Social History  . Marital Status: Married    Spouse Name: N/A  . Number of Children: 2  . Years of Education: N/A   Occupational History  . disability due to back  Social History Main Topics  . Smoking status: Former Smoker -- 0.50 packs/day for 15 years    Quit date: 11/11/2002  . Smokeless tobacco: Never Used  . Alcohol Use: No  . Drug Use: No  . Sexual Activity: Not on file   Other Topics Concern  . Not on file   Social History Narrative   Married   No regular exercise     No family history of premature CAD in 1st degree relatives.  Prior to Admission medications   Medication Sig Start Date End Date Taking? Authorizing Provider  baclofen (LIORESAL) 10 MG tablet Take 10 mg by mouth 3 (three) times  daily as needed. For hand cramps   Yes Historical Provider, MD  hydrochlorothiazide (HYDRODIURIL) 25 MG tablet Take 25 mg by mouth daily. 01/29/15  Yes Historical Provider, MD  ibuprofen (ADVIL,MOTRIN) 200 MG tablet Take 200 mg by mouth every 6 (six) hours as needed. Use seldom. For pain   Yes Historical Provider, MD  potassium chloride (K-DUR,KLOR-CON) 10 MEQ tablet Take 1 tablet by mouth 3 (three) times a week. 01/31/15  Yes Historical Provider, MD     Review of systems complete and found to be negative unless listed above in HPI     Physical exam Blood pressure 132/74, pulse 83, height 5' 11.5" (1.816 m), weight 300 lb 12.8 oz (136.442 kg), SpO2 96 %. General: NAD Neck: No JVD, no thyromegaly or thyroid nodule.  Lungs: Clear to auscultation bilaterally with normal respiratory effort. CV: Nondisplaced PMI. Regular rate and rhythm, normal S1/S2, no S3/S4, no murmur.  Woody pretibial edema with chronic venous stasis.  No carotid bruit.   Abdomen: Firm, obese. Skin: Intact without lesions or rashes.  Neurologic: Alert and oriented x 3.  Psych: Normal affect. HEENT: Normal.   ECG: Most recent ECG reviewed.  Labs:   Lab Results  Component Value Date   WBC 3.9* 03/05/2015   HGB 11.6* 03/05/2015   HCT 36.7* 03/05/2015   MCV 83.6 03/05/2015   PLT 187 03/05/2015   No results for input(s): NA, K, CL, CO2, BUN, CREATININE, CALCIUM, PROT, BILITOT, ALKPHOS, ALT, AST, GLUCOSE in the last 168 hours.  Invalid input(s): LABALBU No results found for: CKTOTAL, CKMB, CKMBINDEX, TROPONINI No results found for: CHOL No results found for: HDL No results found for: LDLCALC No results found for: TRIG No results found for: CHOLHDL No results found for: LDLDIRECT       Studies: No results found.  ASSESSMENT AND PLAN:  1. Arrhythmia with PVC's: Appears to be asymptomatic from this standpoint, and is in a regular rhythm today. Does have LVH with repolarization changes. Will obtain basic  metabolic panel and serum magnesium level to evaluate for readily correctable electrolyte abnormalities which can lead to frequent PVCs. Will also obtain an echocardiogram to evaluate left ventricular systolic and diastolic function, regional wall motion, and wall thickness.  2. Progressive exertional dyspnea and fatigue: Given ECG abnormalities, will obtain a Lexiscan Cardiolite stress test to evaluate for occult ischemic heart disease.  Dispo: f/u 4-6 weeks.   Signed: Kate Sable, M.D., F.A.C.C.  03/06/2015, 3:25 PM

## 2015-03-08 ENCOUNTER — Telehealth: Payer: Self-pay | Admitting: Cardiovascular Disease

## 2015-03-08 ENCOUNTER — Other Ambulatory Visit (HOSPITAL_COMMUNITY): Payer: Self-pay | Admitting: Hematology & Oncology

## 2015-03-08 ENCOUNTER — Ambulatory Visit (HOSPITAL_COMMUNITY)
Admission: RE | Admit: 2015-03-08 | Discharge: 2015-03-08 | Disposition: A | Discharge status: Home / Self Care | Payer: Medicare Other | Source: Ambulatory Visit | Attending: Internal Medicine | Admitting: Internal Medicine

## 2015-03-08 ENCOUNTER — Encounter (HOSPITAL_COMMUNITY)
Admission: RE | Disposition: A | Discharge status: Home / Self Care | Payer: Self-pay | Source: Ambulatory Visit | Attending: Internal Medicine

## 2015-03-08 ENCOUNTER — Telehealth: Payer: Self-pay

## 2015-03-08 ENCOUNTER — Encounter (HOSPITAL_COMMUNITY): Payer: Self-pay | Admitting: *Deleted

## 2015-03-08 DIAGNOSIS — Z8572 Personal history of non-Hodgkin lymphomas: Secondary | ICD-10-CM | POA: Diagnosis not present

## 2015-03-08 DIAGNOSIS — Z6841 Body Mass Index (BMI) 40.0 and over, adult: Secondary | ICD-10-CM | POA: Diagnosis not present

## 2015-03-08 DIAGNOSIS — D122 Benign neoplasm of ascending colon: Secondary | ICD-10-CM | POA: Insufficient documentation

## 2015-03-08 DIAGNOSIS — K5140 Inflammatory polyps of colon without complications: Secondary | ICD-10-CM | POA: Diagnosis not present

## 2015-03-08 DIAGNOSIS — Z79899 Other long term (current) drug therapy: Secondary | ICD-10-CM | POA: Diagnosis not present

## 2015-03-08 DIAGNOSIS — K3189 Other diseases of stomach and duodenum: Secondary | ICD-10-CM | POA: Insufficient documentation

## 2015-03-08 DIAGNOSIS — K449 Diaphragmatic hernia without obstruction or gangrene: Secondary | ICD-10-CM | POA: Insufficient documentation

## 2015-03-08 DIAGNOSIS — I10 Essential (primary) hypertension: Secondary | ICD-10-CM | POA: Diagnosis not present

## 2015-03-08 DIAGNOSIS — Z7982 Long term (current) use of aspirin: Secondary | ICD-10-CM | POA: Diagnosis not present

## 2015-03-08 DIAGNOSIS — Q438 Other specified congenital malformations of intestine: Secondary | ICD-10-CM | POA: Diagnosis not present

## 2015-03-08 DIAGNOSIS — Z86010 Personal history of colonic polyps: Secondary | ICD-10-CM | POA: Diagnosis not present

## 2015-03-08 DIAGNOSIS — I5032 Chronic diastolic (congestive) heart failure: Secondary | ICD-10-CM | POA: Insufficient documentation

## 2015-03-08 DIAGNOSIS — E876 Hypokalemia: Secondary | ICD-10-CM

## 2015-03-08 DIAGNOSIS — D509 Iron deficiency anemia, unspecified: Secondary | ICD-10-CM | POA: Insufficient documentation

## 2015-03-08 DIAGNOSIS — D125 Benign neoplasm of sigmoid colon: Secondary | ICD-10-CM | POA: Insufficient documentation

## 2015-03-08 DIAGNOSIS — D127 Benign neoplasm of rectosigmoid junction: Secondary | ICD-10-CM | POA: Diagnosis not present

## 2015-03-08 DIAGNOSIS — Z87891 Personal history of nicotine dependence: Secondary | ICD-10-CM | POA: Diagnosis not present

## 2015-03-08 DIAGNOSIS — E782 Mixed hyperlipidemia: Secondary | ICD-10-CM | POA: Diagnosis not present

## 2015-03-08 DIAGNOSIS — J45909 Unspecified asthma, uncomplicated: Secondary | ICD-10-CM | POA: Diagnosis not present

## 2015-03-08 DIAGNOSIS — D175 Benign lipomatous neoplasm of intra-abdominal organs: Secondary | ICD-10-CM | POA: Diagnosis not present

## 2015-03-08 DIAGNOSIS — K317 Polyp of stomach and duodenum: Secondary | ICD-10-CM | POA: Diagnosis not present

## 2015-03-08 DIAGNOSIS — K2950 Unspecified chronic gastritis without bleeding: Secondary | ICD-10-CM | POA: Diagnosis not present

## 2015-03-08 HISTORY — PX: COLONOSCOPY: SHX5424

## 2015-03-08 HISTORY — PX: ESOPHAGOGASTRODUODENOSCOPY: SHX5428

## 2015-03-08 HISTORY — PX: GIVENS CAPSULE STUDY: SHX5432

## 2015-03-08 SURGERY — COLONOSCOPY
Anesthesia: Moderate Sedation

## 2015-03-08 MED ORDER — LIDOCAINE VISCOUS 2 % MT SOLN
OROMUCOSAL | Status: AC
  Filled 2015-03-08: qty 15

## 2015-03-08 MED ORDER — MIDAZOLAM HCL 5 MG/5ML IJ SOLN
INTRAMUSCULAR | Status: AC
  Filled 2015-03-08: qty 5

## 2015-03-08 MED ORDER — POTASSIUM CHLORIDE ER 10 MEQ PO TBCR: EXTENDED_RELEASE_TABLET | ORAL | Status: DC

## 2015-03-08 MED ORDER — MEPERIDINE HCL 100 MG/ML IJ SOLN
INTRAMUSCULAR | Status: DC | PRN
  Administered 2015-03-08: 50 mg via INTRAVENOUS
  Administered 2015-03-08 (×4): 25 mg via INTRAVENOUS

## 2015-03-08 MED ORDER — ONDANSETRON HCL 4 MG/2ML IJ SOLN
INTRAMUSCULAR | Status: AC
  Filled 2015-03-08: qty 2

## 2015-03-08 MED ORDER — ONDANSETRON HCL 4 MG/2ML IJ SOLN
INTRAMUSCULAR | Status: DC | PRN
  Administered 2015-03-08: 4 mg via INTRAVENOUS

## 2015-03-08 MED ORDER — LIDOCAINE VISCOUS 2 % MT SOLN
OROMUCOSAL | Status: DC | PRN
  Administered 2015-03-08: 6 mL via OROMUCOSAL

## 2015-03-08 MED ORDER — MIDAZOLAM HCL 5 MG/5ML IJ SOLN
INTRAMUSCULAR | Status: DC | PRN
  Administered 2015-03-08 (×4): 1 mg via INTRAVENOUS
  Administered 2015-03-08 (×2): 2 mg via INTRAVENOUS
  Administered 2015-03-08 (×2): 1 mg via INTRAVENOUS

## 2015-03-08 MED ORDER — STERILE WATER FOR IRRIGATION IR SOLN
Status: DC | PRN
  Administered 2015-03-08: 10:00:00

## 2015-03-08 MED ORDER — SODIUM CHLORIDE 0.9 % IV SOLN
INTRAVENOUS | Status: DC | PRN
  Administered 2015-03-08: 1000 mL via INTRAVENOUS

## 2015-03-08 MED ORDER — MIDAZOLAM HCL 5 MG/5ML IJ SOLN
INTRAMUSCULAR | Status: AC
  Filled 2015-03-08: qty 10

## 2015-03-08 MED ORDER — MEPERIDINE HCL 100 MG/ML IJ SOLN
INTRAMUSCULAR | Status: AC
  Filled 2015-03-08: qty 2

## 2015-03-08 MED ORDER — SODIUM CHLORIDE 0.9 % IV SOLN
INTRAVENOUS | Status: DC
  Administered 2015-03-08: 09:00:00 via INTRAVENOUS

## 2015-03-08 NOTE — Telephone Encounter (Signed)
Error - see previous phone note for message.

## 2015-03-08 NOTE — Telephone Encounter (Signed)
LMTCB-cc,lab order placed for BMET

## 2015-03-08 NOTE — Op Note (Signed)
[facility] [address] [place], [address]   ENDOSCOPY PROCEDURE REPORT  PATIENT: [name], [name]  MR#: [id number] BIRTHDATE: Jun 08, 1951 , 43  yrs. old GENDER: male ENDOSCOPIST: R.  Garfield Cornea, MD FACP [facility] REFERRED BY: PROCEDURE DATE:  03/31/15 PROCEDURE:  EGD w/ biopsy and snare polypectomy  ; Video Capsule Deployment and hemostasis clip placement INDICATIONS:  Iron deficiency anemia; see colonoscopy report. MEDICATIONS: Versed 11 mg IV and Demerol 150 mg IV in divided doses. Xylocaine gel orally.  Zofran 4 mg IV. ASA CLASS:      Class III  CONSENT: The risks, benefits, limitations, alternatives and imponderables have been discussed.  The potential for biopsy, esophogeal dilation, etc. have also been reviewed.  Questions have been answered.  All parties agreeable.  Please see the history and physical in the medical record for more information.  DESCRIPTION OF PROCEDURE: After the risks benefits and alternatives of the procedure were thoroughly explained, informed consent was obtained.  The EC-[id number] ([id number]) endoscope was introduced through the mouth and advanced to the second portion of the duodenum , limited by Without limitations. The instrument was slowly withdrawn as the mucosa was fully examined. Estimated blood loss is zero unless otherwise noted in this procedure report.    Normal-appearing tubular esophagus.  Stomach with some bile-stained gastric mucus.  Gastric mucosa appeared polypoid diffusely. Patient had multiple pedunculated polyps some some friable; the largest 8 mm pedunculated at the angularis.  Please see photos.  3 cm hiatal hernia.  No ulcer or infiltrating process.  Pyloric channel somewhat angulated and difficult approach with the scope it was patent.  Examination of the first and second portion of the duodenum revealed no abnormalities.  The scope was withdrawn and capsule deployment device was loaded and the video capsule  was loaded up.  Scope was reintroduced into the stomach.  Very difficult approach lining up on the pyloric channel after multiple attempts, I was unable to deploy the capsule via the scope through the pyloric channel; I decided to go ahead and deploy it into the antrum.  I subsequently placed a hemostasis clip on the stalk of the largest gastric polyp.  Subsequently, I removed it with hot snare cautery.  Finally, biopsies of the gastric mucosa were taken.  Patient tolerated the procedure well.  EBL 5 mL  Of note, after these maneuvers, it was noted that the capsule spontaneously passed into the duodenum on its own.         The scope was then withdrawn from the patient and the procedure completed.  COMPLICATIONS: There were no immediate complications.  ENDOSCOPIC IMPRESSION: Hiatal hernia. Polypoid gastric mucosa with multiple gastric polyps. Largest polyp removed via snare polypectomy hemostasis clip placed at base. Status post gastric biopsy. Status post video capsule placement  RECOMMENDATIONS: Follow up on pathology. See colonoscopy report. No MRI until clips gone. Review Data As It Becomes Available.  REPEAT EXAM:  eSigned:  R. Garfield Cornea, MD [facility] March 31, 2015 11:01 AM    CC:  CPT CODES: ICD CODES:  The ICD and CPT codes recommended by this software are interpretations from the data that the clinical staff has captured with the software.  The verification of the translation of this report to the ICD and CPT codes and modifiers is the sole responsibility of the health care institution and practicing physician where this report was generated.  [place]. will not be held responsible for the validity of the ICD and CPT codes  included on this report.  AMA assumes no liability for data contained or not contained herein. CPT is a Designer, television/film set of the [employer].  PATIENT NAME:  Frank, [name] MR#: [id number]

## 2015-03-08 NOTE — Op Note (Signed)
[facility] [address] [place], [address]   COLONOSCOPY PROCEDURE REPORT  PATIENT: [name], [name]  MR#: [id number] BIRTHDATE: 1951-07-23 , 12  yrs. old GENDER: male ENDOSCOPIST: R.  Garfield Cornea, MD FACP FACG REFERRED IH:[id number] Penland, MD PROCEDURE DATE:  03/31/2015 PROCEDURE:   Ileo-colonoscopy with ablation, snare polypectomy and biopsy INDICATIONS:Iron deficiency anemia; history of colonic adenoma. MEDICATIONS: Versed 7 mg IV and Demerol 100 mg IV in divided doses. Zofran 4 mg IV. ASA CLASS:       Class III  CONSENT: The risks, benefits, alternatives and imponderables including but not limited to bleeding, perforation as well as the possibility of a missed lesion have been reviewed.  The potential for biopsy, lesion removal, etc. have also been discussed. Questions have been answered.  All parties agreeable.  Please see the history and physical in the medical record for more information.  DESCRIPTION OF PROCEDURE:   After the risks benefits and alternatives of the procedure were thoroughly explained, informed consent was obtained.  The digital rectal exam revealed no abnormalities of the rectum.   The EC-[id number] ([id number])  endoscope was introduced through the anus and advanced to the terminal ileum which was intubated for a short distance. No adverse events experienced.   The quality of the prep was adequate  The instrument was then slowly withdrawn as the colon was fully examined. Estimated blood loss is zero unless otherwise noted in this procedure report.      COLON FINDINGS: Redundant, capacious colon.  Marginal preparation with some vegetable matter and viscous colonic effluent throughout which took some washing and suctioning to gain an adequate preparation.  Multiple position changes and external abdominal pressure required to reach the cecum.  The patient had (1) 7 mm flat polyp at the rectosigmoid junction with adjacent diminutive  polyps.  The patient had (1) 8 mm pedunculated polyp in the mid ascending segment.  The patient also had (2) diminutive polyps in the mid sigmoid segment.  The terminal ileum was intubated to 10 cm.  The patient had a very prominent lymphoid aggregates in this segment but no evidence of ulceration or overt neoplasm.   The patient had a nearly 2 cm lipoma in the ascending segment (chronic finding)  Biopsies of the terminal ileum taken.  The sigmoid polyps were cold biopsy/removed.  The ascending and largest rectosigmoid polyp were hot snare removed.  The adjacent diminutive rectosigmoid polyps were ablated with the tip of a hot snare loop.  Retroflexion was performed. Marland Kitchen EBL 5 mL Withdrawal time=20 minutes 0 seconds.  The scope was withdrawn and the procedure completed. COMPLICATIONS: There were no immediate complications.  ENDOSCOPIC IMPRESSION: Capacious, redundant colon. Multiple colonic and rectosigmoid polyps removed/ablated as described above.  Colonic lipoma Abnormal appearing terminal ileum (likely a variant of normal). However, with history, biopsies obtained.  No explanation for iron deficiency anemia based on today's colonoscopy findings.  RECOMMENDATIONS: Follow-up on pathology. See EGD report.  eSigned:  R. Garfield Cornea, MD Rosalita Chessman [facility] 03-31-2015 10:24 AM   cc:  CPT CODES: ICD CODES:  The ICD and CPT codes recommended by this software are interpretations from the data that the clinical staff has captured with the software.  The verification of the translation of this report to the ICD and CPT codes and modifiers is the sole responsibility of the health care institution and practicing physician where this report was generated.  [facility]. will not be held responsible for the validity of the ICD  and CPT codes included on this report.  AMA assumes no liability for data contained or not contained herein. CPT is a Designer, television/film set of the  [employer].  PATIENT NAME:  [name], [name] MR#: [id number]

## 2015-03-08 NOTE — Addendum Note (Signed)
Addended by: Barbarann Ehlers A on: 03/08/2015 03:35 PM   Modules accepted: Orders, Medications

## 2015-03-08 NOTE — H&P (View-Only) (Signed)
Referring Provider: Patrici Ranks, MD Primary Care Physician:  [name], MD Primary GI:  Dr. Gala Romney  Chief Complaint  Patient presents with  . Anemia    HPI:   64 year old male referred to [place] by oncology for persistent iron deficiency anemia. Patient has a diagnosis of non-Hodgkin's lymphoma which was treated with a bone marrow transplant and at this point he is considered cured. He continues to see oncology for observation and blood counts. They're unsure of the etiology of his current anemia. Patient's last colonoscopy was completed on 06/20/2012 for surveillance due to history of colon adenomas. Findings included suboptimal prep and multiple polyps removed including a 6 mm polyp at the anal verge, multiple small hyperplastic diminutive polyps in the sigmoid segment, diminutive polyp at the base of cecum, and one at the ileocecal valve. Noted 1.57 m yellowish submucosal nodule soft just distal to the ileocecal valve consistent with prior sedation appearing to be a lipoma. Impression was multiple rectal: Polyps treated and removed. Pathology showed benign polypoid colorectal mucosa with lymphoid aggregates as well as hyperplastic polyps. Recommended 5 year repeat colonoscopy (2018). Most recent labs completed by oncology on 01/10/2015 included hemoglobin of 9.4, hematocrit of 31.7, platelets of 124. Stool for occult blood cards 3 were all negative.  Today he states his current issues with anemia started a couple months ago. Is having increased fatigue, weakness, and dyspnea. Denies dizziness and chest pain. Has more gas but is taking more fiber. Denies abdominal pain, N/V, hematochezia. When asked about melena he states "not really, have had a couple episodes of darker stools but it clears up after a couple days." Last incidence of dark stools was about 3 months ago. Denies GERD symptoms. Takes Aleve "every now and then" for knee pain. Denies ASA powders. Denies fever, chills,  unintentional weight loss, dizziness, lightheadedness, syncope, near syncope. Denies any other upper or lower GI symptoms.   Past Medical History  Diagnosis Date  . Hypertension   . Asthma   . 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 Venous insufficiency 04/29/2011  . Obesity, morbid (more than 100 lbs over ideal weight or BMI > 40)   . Edema   . Heart failure, diastolic, chronic     patient denies  . Hyperlipidemia, mixed     Past Surgical History  Procedure Laterality Date  . Multiple tooth extractions  04/2005  . Back surgery  2006    T6 vertebrae removed/titanium placed  . Lung biopsy  6/06  . Colonoscopy  11/24/2004    Polyps in the left colon ablated/removed as described above.  Two submucosal lesions consistent with lipomas as described above not  manipulated./ Normal rectum  . Port a cath placement    . Colonoscopy  06/20/2012    MULTIPLE RECTAL AND COLONIC POLYPS  . Limbal stem cell transplant    . Port-a-cath removal  09/08/2012    Procedure: REMOVAL PORT-A-CATH;  Surgeon: Melrose Nakayama, MD;  Location: [facility];  Service: Thoracic;  Laterality: N/A;    Current Outpatient Prescriptions  Medication Sig Dispense Refill  . acetaminophen (TYLENOL) 500 MG tablet Take 500 mg by mouth every 6 (six) hours as needed for pain.    . baclofen (LIORESAL) 10 MG tablet Take 10 mg by mouth 3 (three) times daily as needed. For hand cramps    . hydrochlorothiazide (HYDRODIURIL) 25 MG tablet Take 25 mg by mouth daily.  0  . potassium  chloride (K-DUR,KLOR-CON) 10 MEQ tablet Take 1 tablet by mouth 3 (three) times a week.  0  . Homeopathic Products (ALLERGY MEDICINE PO) Take 1 capsule by mouth as needed.    Marland Kitchen ibuprofen (ADVIL,MOTRIN) 200 MG tablet Take 200 mg by mouth every 6 (six) hours as needed. Use seldom. For pain     No current facility-administered medications for this visit.    Allergies as of 02/21/2015 - Review Complete 02/21/2015  Allergen  Reaction Noted  . Penicillins Rash 04/29/2011    Family History  Problem Relation Age of Onset  . Cancer Mother     lung  . Cancer Father     prostate  . Colon cancer Neg Hx     History   Social History  . Marital Status: Married    Spouse Name: N/A  . Number of Children: 2  . Years of Education: N/A   Occupational History  . disability due to back    Social History Main Topics  . Smoking status: Former Smoker -- 0.50 packs/day for 15 years    Quit date: 11/11/2011  . Smokeless tobacco: Never Used  . Alcohol Use: No  . Drug Use: No  . Sexual Activity: Not on file   Other Topics Concern  . None   Social History Narrative   Married   No regular exercise    Review of Systems: General: Negative for anorexia, weight loss, fever, chills. Admits increasing fatigue, weakness. Eyes: Negative for vision changes.  ENT: Negative for hoarseness, difficulty swallowing . CV: Negative for chest pain, angina, palpitations, peripheral edema.  Respiratory: Negative for cough, sputum, wheezing.  GI: See history of present illness. Derm: Negative for rash or itching.  Endo: Negative for unusual weight change.  Heme: Negative for bruising or bleeding. Allergy: Negative for rash or hives.   Physical Exam: BP 143/79 mmHg  Pulse 86  Temp(Src) 97.6 F (36.4 C) (Oral)  Ht '5\' 11"'$  (1.803 m)  Wt 298 lb (135.172 kg)  BMI 41.58 kg/m2 General:   Alert and oriented. Pleasant and cooperative. Well-nourished and well-developed. Appears tired. Head:  Normocephalic and atraumatic. Eyes:  Without icterus, sclera clear and conjunctiva pink.  Ears:  Normal auditory acuity. Cardiovascular:  S1, S2 present without murmurs appreciated. Normal pulses noted. Extremities without clubbing or edema. Respiratory:  Clear to auscultation bilaterally. No wheezes, rales, or rhonchi. No distress.  Gastrointestinal:  +BS, soft, non-tender and non-distended. No HSM noted. No guarding or rebound. No masses  appreciated.  Rectal:  Deferred  Skin:  Intact without significant lesions or rashes noted. Neurologic:  Alert and oriented x4;  grossly normal neurologically. Psych:  Alert and cooperative. Normal mood and affect. Heme/Lymph/Immune: No excessive bruising noted.    02/21/2015 9:30 AM

## 2015-03-08 NOTE — Discharge Instructions (Signed)
Colonoscopy Discharge Instructions  Read the instructions outlined below and refer to this sheet in the next few weeks. These discharge instructions provide you with general information on caring for yourself after you leave the hospital. Your doctor may also give you specific instructions. While your treatment has been planned according to the most current medical practices available, unavoidable complications occasionally occur. If you have any problems or questions after discharge, call Dr. Gala Romney at [phone]. ACTIVITY  You may resume your regular activity, but move at a slower pace for the next 24 hours.   Take frequent rest periods for the next 24 hours.   Walking will help get rid of the air and reduce the bloated feeling in your belly (abdomen).   No driving for 24 hours (because of the medicine (anesthesia) used during the test).    Do not sign any important legal documents or operate any machinery for 24 hours (because of the anesthesia used during the test).  NUTRITION  Drink plenty of fluids.   You may resume your normal diet as instructed by your doctor.   Begin with a light meal and progress to your normal diet. Heavy or fried foods are harder to digest and may make you feel sick to your stomach (nauseated).   Avoid alcoholic beverages for 24 hours or as instructed.  MEDICATIONS  You may resume your normal medications unless your doctor tells you otherwise.  WHAT YOU CAN EXPECT TODAY  Some feelings of bloating in the abdomen.   Passage of more gas than usual.   Spotting of blood in your stool or on the toilet paper.  IF YOU HAD POLYPS REMOVED DURING THE COLONOSCOPY:  No aspirin products for 7 days or as instructed.   No alcohol for 7 days or as instructed.   Eat a soft diet for the next 24 hours.  FINDING OUT THE RESULTS OF YOUR TEST Not all test results are available during your visit. If your test results are not back during the visit, make an appointment  with your caregiver to find out the results. Do not assume everything is normal if you have not heard from your caregiver or the medical facility. It is important for you to follow up on all of your test results.  SEEK IMMEDIATE MEDICAL ATTENTION IF:  You have more than a spotting of blood in your stool.   Your belly is swollen (abdominal distention).   You are nauseated or vomiting.   You have a temperature over 101.  You have abdominal pain or discomfort that is severe or gets worse throughout the day. EGD Discharge instructions Please read the instructions outlined below and refer to this sheet in the next few weeks. These discharge instructions provide you with general information on caring for yourself after you leave the hospital. Your doctor may also give you specific instructions. While your treatment has been planned according to the most current medical practices available, unavoidable complications occasionally occur. If you have any problems or questions after discharge, please call your doctor. ACTIVITY You may resume your regular activity but move at a slower pace for the next 24 hours.  Take frequent rest periods for the next 24 hours.  Walking will help expel (get rid of) the air and reduce the bloated feeling in your abdomen.  No driving for 24 hours (because of the anesthesia (medicine) used during the test).  You may shower.  Do not sign any important legal documents or operate any machinery for 24  hours (because of the anesthesia used during the test).  NUTRITION Drink plenty of fluids.  You may resume your normal diet.  Begin with a light meal and progress to your normal diet.  Avoid alcoholic beverages for 24 hours or as instructed by your caregiver.  MEDICATIONS You may resume your normal medications unless your caregiver tells you otherwise.  WHAT YOU CAN EXPECT TODAY You may experience abdominal discomfort such as a feeling of fullness or gas pains.   FOLLOW-UP Your doctor will discuss the results of your test with you.  SEEK IMMEDIATE MEDICAL ATTENTION IF ANY OF THE FOLLOWING OCCUR: Excessive nausea (feeling sick to your stomach) and/or vomiting.  Severe abdominal pain and distention (swelling).  Trouble swallowing.  Temperature over 101 F (37.8 C).  Rectal bleeding or vomiting of blood.     Follow-up instructions provided regarding small intestine video capsule placement  Polyp information provided  No MRI in the future until stomach clip noted to be gone  Further recommendations to follow once results are back for review.   Colon Polyps Polyps are lumps of extra tissue growing inside the body. Polyps can grow in the large intestine (colon). Most colon polyps are noncancerous (benign). However, some colon polyps can become cancerous over time. Polyps that are larger than a pea may be harmful. To be safe, caregivers remove and test all polyps. CAUSES  Polyps form when mutations in the genes cause your cells to grow and divide even though no more tissue is needed. RISK FACTORS There are a number of risk factors that can increase your chances of getting colon polyps. They include: Being older than 50 years. Family history of colon polyps or colon cancer. Long-term colon diseases, such as colitis or Crohn disease. Being overweight. Smoking. Being inactive. Drinking too much alcohol. SYMPTOMS  Most small polyps do not cause symptoms. If symptoms are present, they may include: Blood in the stool. The stool may look dark red or black. Constipation or diarrhea that lasts longer than 1 week. DIAGNOSIS People often do not know they have polyps until their caregiver finds them during a regular checkup. Your caregiver can use 4 tests to check for polyps: Digital rectal exam. The caregiver wears gloves and feels inside the rectum. This test would find polyps only in the rectum. Barium enema. The caregiver puts a liquid called  barium into your rectum before taking X-rays of your colon. Barium makes your colon look white. Polyps are dark, so they are easy to see in the X-ray pictures. Sigmoidoscopy. A thin, flexible tube (sigmoidoscope) is placed into your rectum. The sigmoidoscope has a light and tiny camera in it. The caregiver uses the sigmoidoscope to look at the last third of your colon. Colonoscopy. This test is like sigmoidoscopy, but the caregiver looks at the entire colon. This is the most common method for finding and removing polyps. TREATMENT  Any polyps will be removed during a sigmoidoscopy or colonoscopy. The polyps are then tested for cancer. PREVENTION  To help lower your risk of getting more colon polyps: Eat plenty of fruits and vegetables. Avoid eating fatty foods. Do not smoke. Avoid drinking alcohol. Exercise every day. Lose weight if recommended by your caregiver. Eat plenty of calcium and folate. Foods that are rich in calcium include milk, cheese, and broccoli. Foods that are rich in folate include chickpeas, kidney beans, and spinach. HOME CARE INSTRUCTIONS Keep all follow-up appointments as directed by your caregiver. You may need periodic exams to check for  polyps. SEEK MEDICAL CARE IF: You notice bleeding during a bowel movement. Document Released: 05/06/2004 Document Revised: 11/02/2011 Document Reviewed: 10/20/2011 [facility] Patient Information 2015 [place], [place]. This information is not intended to replace advice given to you by your health care provider. Make sure you discuss any questions you have with your health care provider.

## 2015-03-08 NOTE — Telephone Encounter (Signed)
Spoke with wife,she understands that pt will take Potassium 40 meq today and then 10 meq daily thereafter

## 2015-03-08 NOTE — Telephone Encounter (Signed)
-----   Message from Herminio Commons, MD sent at 03/07/2015 10:18 AM EDT ----- Hypokalemic. Would take 40 meq KCl x 1, then take 10 meq daily and repeat BMET in 5 days. Likely contributing to PVC's.

## 2015-03-08 NOTE — Telephone Encounter (Signed)
Please Mrs. Broker back at [phone]. The patient is with her at home.

## 2015-03-08 NOTE — Interval H&P Note (Signed)
History and Physical Interval Note:  03/08/2015 9:22 AM  [name]  has presented today for surgery, with the diagnosis of IDA, hx of colon polyps  The various methods of treatment have been discussed with the patient and family. After consideration of risks, benefits and other options for treatment, the patient has consented to  Procedure(s) with comments: COLONOSCOPY (N/A) - 930 ESOPHAGOGASTRODUODENOSCOPY (EGD) (N/A) GIVENS CAPSULE STUDY (N/A) as a surgical intervention .  The patient's history has been reviewed, patient examined, no change in status, stable for surgery.  I have reviewed the patient's chart and labs.  Questions were answered to the patient's satisfaction.     [name]  No change. Iron deficiency anemia. Hemoccult-negative multiple times. Plan as outlined in the office note.  The risks, benefits, limitations, imponderables and alternatives regarding both EGD and colonoscopy have been reviewed with the patient. Questions have been answered. All parties agreeable.

## 2015-03-08 NOTE — Progress Notes (Signed)
Given's capsule at started at 1040

## 2015-03-08 NOTE — Telephone Encounter (Signed)
Continue to attempt to reach pt-cc

## 2015-03-11 ENCOUNTER — Encounter (HOSPITAL_COMMUNITY): Payer: Self-pay | Admitting: Internal Medicine

## 2015-03-13 ENCOUNTER — Encounter: Payer: Self-pay | Admitting: Internal Medicine

## 2015-03-14 ENCOUNTER — Telehealth: Payer: Self-pay | Admitting: Gastroenterology

## 2015-03-14 DIAGNOSIS — D509 Iron deficiency anemia, unspecified: Secondary | ICD-10-CM | POA: Insufficient documentation

## 2015-03-14 NOTE — Telephone Encounter (Signed)
CAPSULE STUDY completed. I have attached my recommendations/findings below:   64 year old male with IDA and without endoscopic findings for etiology, with capsule raising question of AVMs as culprit. It must be noted that visualization of small bowel was not ideal but fair throughout the study. Likely AVM at 04:07:33, but other areas as mentioned above appeared to have "old blood", which is likely secondary to previous biopsies completed at time of procedure. Upon review of outpatient med list, Ibuprofen is listed as taken prn. Appears iron, ferritin, and Hgb have improved from several months ago, after iron infusion per Hematology.   1. Would recommend absolute avoidance of all NSAIDs, aspirin powders.  2. Keep close follow-up with Hematology and GI. Repeat CBC, iron studies in 6 weeks (already on file, hematology has ordered).  3. Keep upcoming office visit with Walden Field, NP on August 30th. Hold on referral to [facility], as AVMs are questionable and very sparse.  4. Monitor for worsening anemia, black stools. In interim, supportive measures indicated.

## 2015-03-14 NOTE — Op Note (Signed)
Small Bowel Givens Capsule Study Procedure date:  March 08, 2015  Referring Provider:  Dr. Gala Romney  PCP:  Dr. [name], MD  Indication for procedure:   64 year old male with history of new onset IDA, with recent colonoscopy and EGD completed. EGD noting polypoid gastric mucosa with multiple gastric polyps s/p snare polypectomy of largest polyps, gastric biopsy with inflammatory gastric polyp. No explanation for IDA. Colonoscopy with redundant colon, multiple colonic and rectosigmoid polyps removed, hyperplastic. Terminal ileum biopsies normal. Capsule study now indicated to conclude evaluation for IDA.    Findings:   Pyloric channel angulated and difficult to deploy capsule via the scope; therefore, it was deployed in the antrum. Capsule study was complete to the cecum. Exam somewhat limited due to debris, bile. Question of AVM at 4:00:36 vs downstream remnant from polypectomy, biopsies. Likely definite AVM at 4:07:33. Again at 4:39:23 a question of old blood. Interesting appearance to distal small bowel consistent with ileal lymphoid hyperplasia. This was also noted on colonoscopy. Bile stained mucosa throughout distal small bowel. No obvious mass noted. Query possibility of AVMs as culprit for IDA. Due to amount of time in stomach, unable to rule out delayed gastric emptying.   First Gastric image:  00:15:44 First Duodenal image: 03:50:48 First Cecal image: 16:83:72 Gastric Passage time: 3h 6mSmall Bowel Passage time:  5h 314mSummary & Recommendations: [age]ear old male with IDA and without endoscopic findings for etiology, with capsule raising question of AVMs as culprit. It must be noted that visualization of small bowel was not ideal but fair throughout the study. Likely AVM at 04:07:33, but other areas as mentioned above appeared to have "old blood", which is likely secondary to previous biopsies completed at time of procedure. Upon review of outpatient med list, Ibuprofen is  listed as taken prn. Appears iron, ferritin, and Hgb have improved from several months ago, after iron infusion per Hematology.   Would recommend absolute avoidance of all NSAIDs, aspirin powders. Keep close follow-up with Hematology and GI. Repeat CBC, iron studies in 6 weeks (already on file, hematology has ordered). Keep upcoming office visit with ErWalden FieldNP on August 30th. [facility]. Monitor for worsening anemia, black stools. In interim, supportive measures indicated.   AnOrvil FeilANP-BC [facility]     Attending note:  Pertinent images reviewed. Agree with above assessment and recommendations

## 2015-03-18 ENCOUNTER — Other Ambulatory Visit (HOSPITAL_COMMUNITY): Payer: Medicare Other

## 2015-03-18 ENCOUNTER — Encounter (HOSPITAL_COMMUNITY): Payer: Medicare Other

## 2015-03-18 ENCOUNTER — Inpatient Hospital Stay (HOSPITAL_COMMUNITY): Admission: RE | Admit: 2015-03-18 | Payer: Medicare Other | Source: Ambulatory Visit

## 2015-03-20 ENCOUNTER — Other Ambulatory Visit (HOSPITAL_COMMUNITY): Payer: Medicare Other

## 2015-03-20 DIAGNOSIS — Z0000 Encounter for general adult medical examination without abnormal findings: Secondary | ICD-10-CM | POA: Diagnosis not present

## 2015-03-20 DIAGNOSIS — E782 Mixed hyperlipidemia: Secondary | ICD-10-CM | POA: Diagnosis not present

## 2015-03-20 DIAGNOSIS — Z6841 Body Mass Index (BMI) 40.0 and over, adult: Secondary | ICD-10-CM | POA: Diagnosis not present

## 2015-03-20 DIAGNOSIS — R972 Elevated prostate specific antigen [PSA]: Secondary | ICD-10-CM | POA: Diagnosis not present

## 2015-03-20 DIAGNOSIS — Z1389 Encounter for screening for other disorder: Secondary | ICD-10-CM | POA: Diagnosis not present

## 2015-03-21 ENCOUNTER — Encounter (HOSPITAL_COMMUNITY): Payer: Self-pay | Admitting: Hematology & Oncology

## 2015-03-22 ENCOUNTER — Other Ambulatory Visit (HOSPITAL_COMMUNITY): Payer: Medicare Other

## 2015-03-22 ENCOUNTER — Encounter (HOSPITAL_COMMUNITY): Payer: Medicare Other

## 2015-03-25 NOTE — Telephone Encounter (Signed)
Pt is aware of results, recommendations,  and his appt date and time.

## 2015-03-27 ENCOUNTER — Encounter (HOSPITAL_COMMUNITY): Payer: Self-pay

## 2015-03-27 ENCOUNTER — Ambulatory Visit (HOSPITAL_BASED_OUTPATIENT_CLINIC_OR_DEPARTMENT_OTHER)
Admission: RE | Admit: 2015-03-27 | Discharge: 2015-03-27 | Disposition: A | Discharge status: Home / Self Care | Payer: Medicare Other | Source: Ambulatory Visit | Attending: Cardiovascular Disease | Admitting: Cardiovascular Disease

## 2015-03-27 ENCOUNTER — Encounter (HOSPITAL_COMMUNITY)
Admission: RE | Admit: 2015-03-27 | Discharge: 2015-03-27 | Disposition: A | Discharge status: Home / Self Care | Payer: Medicare Other | Source: Ambulatory Visit | Attending: Cardiovascular Disease | Admitting: Cardiovascular Disease

## 2015-03-27 ENCOUNTER — Inpatient Hospital Stay (HOSPITAL_COMMUNITY): Admission: RE | Admit: 2015-03-27 | Payer: Medicare Other | Source: Ambulatory Visit

## 2015-03-27 DIAGNOSIS — M7989 Other specified soft tissue disorders: Secondary | ICD-10-CM | POA: Diagnosis not present

## 2015-03-27 DIAGNOSIS — R0609 Other forms of dyspnea: Secondary | ICD-10-CM

## 2015-03-27 LAB — NM MYOCAR MULTI W/SPECT W/WALL MOTION / EF
CHL CUP NUCLEAR SDS: 4
CHL CUP NUCLEAR SRS: 4
LVDIAVOL: 101 mL
LVSYSVOL: 42 mL
Peak HR: 99 {beats}/min
RATE: 0.29
Rest HR: 79 {beats}/min
SSS: 7
TID: 1.05

## 2015-03-27 MED ORDER — TECHNETIUM TC 99M SESTAMIBI GENERIC - CARDIOLITE
10.0000 | Freq: Once | INTRAVENOUS | Status: AC | PRN
  Administered 2015-03-27: 10 via INTRAVENOUS

## 2015-03-27 MED ORDER — TECHNETIUM TC 99M SESTAMIBI - CARDIOLITE
30.0000 | Freq: Once | INTRAVENOUS | Status: AC | PRN
  Administered 2015-03-27: 30 via INTRAVENOUS

## 2015-03-27 MED ORDER — REGADENOSON 0.4 MG/5ML IV SOLN
INTRAVENOUS | Status: AC
  Administered 2015-03-27: 0.4 mg via INTRAVENOUS
  Filled 2015-03-27: qty 5

## 2015-03-27 MED ORDER — SODIUM CHLORIDE 0.9 % IJ SOLN
INTRAMUSCULAR | Status: AC
  Administered 2015-03-27: 10 mL via INTRAVENOUS
  Filled 2015-03-27: qty 3

## 2015-03-29 ENCOUNTER — Encounter (HOSPITAL_COMMUNITY): Payer: Medicare Other | Attending: Hematology & Oncology

## 2015-03-29 ENCOUNTER — Encounter (HOSPITAL_COMMUNITY): Payer: Self-pay

## 2015-03-29 DIAGNOSIS — C8330 Diffuse large B-cell lymphoma, unspecified site: Secondary | ICD-10-CM | POA: Insufficient documentation

## 2015-03-29 DIAGNOSIS — D649 Anemia, unspecified: Secondary | ICD-10-CM

## 2015-03-29 MED ORDER — SODIUM CHLORIDE 0.9 % IV SOLN
INTRAVENOUS | Status: DC
  Administered 2015-03-29: 14:00:00 via INTRAVENOUS

## 2015-03-29 MED ORDER — SODIUM CHLORIDE 0.9 % IV SOLN
510.0000 mg | Freq: Once | INTRAVENOUS | Status: AC
  Administered 2015-03-29: 510 mg via INTRAVENOUS
  Filled 2015-03-29: qty 17

## 2015-03-29 NOTE — Progress Notes (Signed)
Bradlee H Spark Tolerated iron infusion well discharged ambulatory

## 2015-03-29 NOTE — Patient Instructions (Signed)
[place] at [facility] Discharge Instructions  RECOMMENDATIONS MADE BY THE CONSULTANT AND ANY TEST RESULTS WILL BE SENT TO YOUR REFERRING PHYSICIAN.  IV iron infusion today Please return as scheduled Please call the clinic if you have any questions or concerns  Thank you for choosing [place] at [facility] to provide your oncology and hematology care.  To afford each patient quality time with our provider, please arrive at least 15 minutes before your scheduled appointment time.    You need to re-schedule your appointment should you arrive 10 or more minutes late.  We strive to give you quality time with our providers, and arriving late affects you and other patients whose appointments are after yours.  Also, if you no show three or more times for appointments you may be dismissed from the clinic at the providers discretion.     Again, thank you for choosing [facility].  Our hope is that these requests will decrease the amount of time that you wait before being seen by our physicians.       _____________________________________________________________  Should you have questions after your visit to [facility], please contact our office at [phone] between the hours of 8:30 a.m. and 4:30 p.m.  Voicemails left after 4:30 p.m. will not be returned until the following business day.  For prescription refill requests, have your pharmacy contact our office.

## 2015-04-03 ENCOUNTER — Encounter: Payer: Medicare Other | Admitting: Adult Health

## 2015-04-03 ENCOUNTER — Encounter: Payer: Self-pay | Admitting: Adult Health

## 2015-04-03 VITALS — BP 126/68 | HR 84 | Ht 71.5 in | Wt 299.0 lb

## 2015-04-03 DIAGNOSIS — I10 Essential (primary) hypertension: Secondary | ICD-10-CM | POA: Insufficient documentation

## 2015-04-03 NOTE — Progress Notes (Signed)
Cardiology Office Note   Date:  04/03/2015   ID:  [name], DOB 03-01-1951, MRN [id number]  PCP:  [name], MD  Cardiologist:  Woodroe Chen, NP   Chief Complaint  Patient presents with  . Irregular Heart Beat      History of Present Illness: [name] is a 64 y.o. male who presents for for ongoing assessment and management of hypertension, irregular heart rhythm, who was seen on consultation during admission to the hospital.  The patient EKG during office visit on 03/05/2015 demonstrated normal sinus rhythm with first degree AV block, PR interval of 220 ms, incomplete right bundle branch block, QRS duration 114 ms.  A repeat echocardiogram revealed an EF of 55-60%.  On 03/27/2015, images were inadequate for LV wall motion assessment, Doppler parameters are consistent with abnormal left ventricular relaxation with grade 1 diastolic dysfunction.  The left atrium was mildly dilated.  A stress test was completed on 03/27/2015, demonstrating no ST segment deviation noted during stress.  There was a small defect of moderate severity present in the basal inferior location.  Due to soft tissue attenuation artifact.  This was found to  be a low risk study.  Left ventricular ejection fraction was normal at 55%.  He is doing well and is compliant with medications. He denies any symptoms of chest pain, palpitations, or DOE.   Past Medical History  Diagnosis Date  . Hypertension   . Asthma   . 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 Venous insufficiency 04/29/2011  . Obesity, morbid (more than 100 lbs over ideal weight or BMI > 40)   . Edema   . Heart failure, diastolic, chronic     patient denies  . Hyperlipidemia, mixed     Past Surgical History  Procedure Laterality Date  . Multiple tooth extractions  04/2005  . Back surgery  2006    T6 vertebrae removed/titanium placed  . Lung biopsy  6/06  . Colonoscopy  11/24/2004     Polyps in the left colon ablated/removed as described above.  Two submucosal lesions consistent with lipomas as described above not  manipulated./ Normal rectum  . Port a cath placement    . Colonoscopy  06/20/2012    MULTIPLE RECTAL AND COLONIC POLYPS  . Limbal stem cell transplant    . Port-a-cath removal  09/08/2012    Procedure: REMOVAL PORT-A-CATH;  Surgeon: Melrose Nakayama, MD;  Location: [facility];  Service: Thoracic;  Laterality: N/A;  . Colonoscopy N/A 03/08/2015    Procedure: COLONOSCOPY;  Surgeon: Daneil Dolin, MD;  [facility];  Service: Endoscopy;  Laterality: N/A;  930  . Esophagogastroduodenoscopy N/A 03/08/2015    Procedure: ESOPHAGOGASTRODUODENOSCOPY (EGD);  Surgeon: Daneil Dolin, MD;  [facility];  Service: Endoscopy;  Laterality: N/A;  . Givens capsule study N/A 03/08/2015    Procedure: GIVENS CAPSULE STUDY;  Surgeon: Daneil Dolin, MD;  [facility];  Service: Endoscopy;  Laterality: N/A;     Current Outpatient Prescriptions  Medication Sig Dispense Refill  . baclofen (LIORESAL) 10 MG tablet Take 10 mg by mouth 3 (three) times daily as needed. For hand cramps    . hydrochlorothiazide (HYDRODIURIL) 25 MG tablet Take 25 mg by mouth daily.  0  . ibuprofen (ADVIL,MOTRIN) 200 MG tablet Take 200 mg by mouth every 6 (six) hours as needed. Use seldom. For pain    . potassium chloride (  K-DUR) 10 MEQ tablet Take 40 meq TODAY 7/15, then take 10 meq daily 90 tablet 3   No current facility-administered medications for this visit.    Allergies:   Penicillins    Social History:  The patient  reports that he quit smoking about 12 years ago. He has never used smokeless tobacco. He reports that he does not drink alcohol or use illicit drugs.   Family History:  The patient's family history includes Cancer in his father and mother. There is no history of Colon cancer.    ROS: .   All other systems are reviewed and negative.Unless otherwise  mentioned in H&P above.   PHYSICAL EXAM: VS:  BP 126/68 mmHg  Pulse 84  Ht 5' 11.5" (1.816 m)  Wt 299 lb (135.626 kg)  BMI 41.13 kg/m2  SpO2 98% , BMI Body mass index is 41.13 kg/(m^2). GEN: Well nourished, well developed, in no acute distress HEENT: normal Neck: no JVD, carotid bruits, or masses Cardiac: RRR; no murmurs, rubs, or gallops,no edema  Respiratory:  clear to auscultation bilaterally, normal work of breathing GI: soft, nontender, nondistended, + BS MS: no deformity or atrophy Skin: warm and dry, no rash Neuro:  Strength and sensation are intact Psych: euthymic mood, full affect   Recent Labs: 01/10/2015: ALT 25 03/05/2015: Hemoglobin 11.6*; Platelets 187 03/06/2015: BUN 13; Creatinine, Ser 1.19; Magnesium 2.1; Potassium 3.1*; Sodium 140    Lipid Panel No results found for: CHOL, TRIG, HDL, CHOLHDL, VLDL, LDLCALC, LDLDIRECT    Wt Readings from Last 3 Encounters:  04/03/15 299 lb (135.626 kg)  03/08/15 300 lb (136.079 kg)  03/06/15 300 lb 12.8 oz (136.442 kg)      Other studies Reviewed: Additional studies/ records that were reviewed today include: None Review of the above records demonstrates: N/A   ASSESSMENT AND PLAN:  1. Palpitations:  Follow up MPI was negative for ischemia. He is without complaints. No further ischemic testing is planned.   2. Hypertension: Review of BP's are all WNL. He is to continue current antihypertensive regimen. He is recommended for a low salt diet and increased exercise regimen. He verbalizes understanding. He will be seen PRN.   Current medicines are reviewed at length with the patient today.    Labs/ tests ordered today include: NA No orders of the defined types were placed in this encounter.     Disposition:   FU with PRN   Signed, Jory Sims, NP  04/03/2015 1:54 PM    [facility]. [address], [place], [place] [address] Phone: [phone]; Fax: [phone]

## 2015-04-03 NOTE — Patient Instructions (Signed)
Your physician recommends that you schedule a follow-up appointment As needed  Your physician recommends that you continue on your current medications as directed. Please refer to the Current Medication list given to you today.  Thank you for choosing [facility]!

## 2015-04-03 NOTE — Progress Notes (Deleted)
Name: [name]    DOB: 1951/03/27  Age: 64 y.o.  MR#: [id number]       PCP:  [name], MD      Insurance: Payor: MEDICARE / Plan: MEDICARE PART A AND B / Product Type: *No Product type* /   CC:    Chief Complaint  Patient presents with  . Irregular Heart Beat    VS Filed Vitals:   04/03/15 1347  BP: 126/68  Pulse: 84  Height: 5' 11.5" (1.816 m)  Weight: 299 lb (135.626 kg)  SpO2: 98%    Weights Current Weight  04/03/15 299 lb (135.626 kg)  03/08/15 300 lb (136.079 kg)  03/06/15 300 lb 12.8 oz (136.442 kg)    Blood Pressure  BP Readings from Last 3 Encounters:  04/03/15 126/68  03/29/15 127/58  03/08/15 122/68     Admit date:  (Not on file) Last encounter with RMR:  Visit date not found   Allergy Penicillins  Current Outpatient Prescriptions  Medication Sig Dispense Refill  . baclofen (LIORESAL) 10 MG tablet Take 10 mg by mouth 3 (three) times daily as needed. For hand cramps    . hydrochlorothiazide (HYDRODIURIL) 25 MG tablet Take 25 mg by mouth daily.  0  . ibuprofen (ADVIL,MOTRIN) 200 MG tablet Take 200 mg by mouth every 6 (six) hours as needed. Use seldom. For pain    . potassium chloride (K-DUR) 10 MEQ tablet Take 40 meq TODAY 7/15, then take 10 meq daily 90 tablet 3   No current facility-administered medications for this visit.    Discontinued Meds:    Medications Discontinued During This Encounter  Medication Reason  . naproxen sodium (ANAPROX) 220 MG tablet Error    Patient Active Problem List   Diagnosis Date Noted  . IDA (iron deficiency anemia)   . Mucosal abnormality of stomach   . History of colonic polyps   . Hx of adenomatous colonic polyps 06/13/2012  . Venous insufficiency 04/29/2011  . DLBCL (diffuse large B cell lymphoma) 02/28/2009  . HYPERLIPIDEMIA-MIXED 02/28/2009  . OBESITY-MORBID (>100') 02/28/2009  . Anemia 02/28/2009  . HYPERTENSION, UNSPECIFIED 02/28/2009  . DIASTOLIC HEART FAILURE, CHRONIC 02/28/2009  . EDEMA  02/28/2009    LABS    Component Value Date/Time   NA 140 03/06/2015 1620   NA 139 01/10/2015 0852   NA 143 01/11/2014 0909   K 3.1* 03/06/2015 1620   K 4.6 01/10/2015 0852   K 4.2 01/11/2014 0909   CL 104 03/06/2015 1620   CL 108 01/10/2015 0852   CL 108 01/11/2014 0909   CO2 27 03/06/2015 1620   CO2 24 01/10/2015 0852   CO2 23 01/11/2014 0909   GLUCOSE 105* 03/06/2015 1620   GLUCOSE 115* 01/10/2015 0852   GLUCOSE 105* 01/11/2014 0909   BUN 13 03/06/2015 1620   BUN 15 01/10/2015 0852   BUN 13 01/11/2014 0909   CREATININE 1.19 03/06/2015 1620   CREATININE 1.09 01/10/2015 0852   CREATININE 1.15 01/11/2014 0909   CALCIUM 8.5* 03/06/2015 1620   CALCIUM 8.9 01/10/2015 0852   CALCIUM 9.1 01/11/2014 0909   GFRNONAA >60 03/06/2015 1620   GFRNONAA >60 01/10/2015 0852   GFRNONAA 67* 01/11/2014 0909   GFRAA >60 03/06/2015 1620   GFRAA >60 01/10/2015 0852   GFRAA 78* 01/11/2014 0909   CMP     Component Value Date/Time   NA 140 03/06/2015 1620   K 3.1* 03/06/2015 1620   CL 104 03/06/2015 1620   CO2 27  03/06/2015 1620   GLUCOSE 105* 03/06/2015 1620   BUN 13 03/06/2015 1620   CREATININE 1.19 03/06/2015 1620   CALCIUM 8.5* 03/06/2015 1620   PROT 6.9 01/10/2015 0852   ALBUMIN 4.1 01/10/2015 0852   AST 24 01/10/2015 0852   ALT 25 01/10/2015 0852   ALKPHOS 75 01/10/2015 0852   BILITOT 0.5 01/10/2015 0852   GFRNONAA >60 03/06/2015 1620   GFRAA >60 03/06/2015 1620       Component Value Date/Time   WBC 3.9* 03/05/2015 1258   WBC 3.9* 01/10/2015 0852   WBC 4.9 01/11/2014 0909   HGB 11.6* 03/05/2015 1258   HGB 9.4* 01/10/2015 0852   HGB 11.6* 01/11/2014 0909   HCT 36.7* 03/05/2015 1258   HCT 31.7* 01/10/2015 0852   HCT 36.8* 01/11/2014 0909   MCV 83.6 03/05/2015 1258   MCV 78.3 01/10/2015 0852   MCV 87.0 01/11/2014 0909    Lipid Panel  No results found for: CHOL, TRIG, HDL, CHOLHDL, VLDL, LDLCALC, LDLDIRECT  ABG No results found for: PHART, PCO2ART, PO2ART, HCO3,  TCO2, ACIDBASEDEF, O2SAT   No results found for: TSH BNP (last 3 results) No results for input(s): BNP in the last 8760 hours.  ProBNP (last 3 results) No results for input(s): PROBNP in the last 8760 hours.  Cardiac Panel (last 3 results) No results for input(s): CKTOTAL, CKMB, TROPONINI, RELINDX in the last 72 hours.  Iron/TIBC/Ferritin/ %Sat    Component Value Date/Time   IRON 57 03/05/2015 1259   TIBC 354 03/05/2015 1259   FERRITIN 83 03/05/2015 1259   IRONPCTSAT 16* 03/05/2015 1259     EKG Orders placed or performed in visit on 03/05/15  . EKG 12-Lead     Prior Assessment and Plan Problem List as of 04/03/2015      Cardiovascular and Mediastinum   HYPERTENSION, UNSPECIFIED   DIASTOLIC HEART FAILURE, CHRONIC   Venous insufficiency     Digestive   Mucosal abnormality of stomach     Other   DLBCL (diffuse large B cell lymphoma)   HYPERLIPIDEMIA-MIXED   OBESITY-MORBID (>100')   Anemia   Last Assessment & Plan 02/21/2015 Office Visit Edited 03/01/2015  3:47 PM by Carlis Stable, NP    64 year old male with a history of non-Hodgkin's lymphoma which is cured after bone marrow transplant. Has had a recurrent new onset of anemia which is evaluated by oncology and they're unsure the etiology at this point. Patient does have a history of colonic adenomas, his last colonoscopy was completed in 2013 with multiple polyp removal which were found to be hyperplastic. His baseline hemoglobin appears to be between 12 and 13 however his last hemoglobin 1 month ago was significantly lower at 9.6. At this point given his history and new onset anemia we will plan to evaluate him further with a colonoscopy and endoscopy with possible capsule study if needed to complete workup for GI etiology. We'll have him return in 2 months for further evaluation.  Proceed with TCS and EGD +/- capsule endoscopy with Dr. Gala Romney in near future: the risks, benefits, and alternatives have been discussed with the  patient in detail. The patient states understanding and desires to proceed.  The patient is not on any anticoagulants, antidepressants, chronic pain medicines, anxiolytics. His last procedure 2-3 years ago was completed with conscious sedation without issue. Conscious sedation is likely adequate for his procedure.       EDEMA   Hx of adenomatous colonic polyps   Last Assessment & Plan  02/21/2015 Office Visit Written 03/01/2015  3:48 PM by Carlis Stable, NP    Patient with a new onset of acute anemia with hemoglobin 9.6 which is substantially lower from his baseline of 12-13. Has a history of colonic adenomas. His last colonoscopy was in 2013 which had poor prep and multiple polyps removed which were generally found to be hyperplastic. Recommended 5 year repeat colonoscopy which 2018. However given his oncology evaluation with new onset anemia of unknown etiology we will proceed with colonoscopy and endoscopy with possible capsule study to further evaluate possible GI etiology for his anemia. We will have him return to the clinic in 2 months for further evaluation after his procedure.  Proceed with TCS and EGD +/- capsule endoscopy with Dr. Gala Romney in near future: the risks, benefits, and alternatives have been discussed with the patient in detail. The patient states understanding and desires to proceed.  The patient is not on any anticoagulants, antidepressants, chronic pain medicines, anxiolytics. His last procedure 2-3 years ago was completed with conscious sedation without issue. Conscious sedation is likely adequate for his procedure.       History of colonic polyps   IDA (iron deficiency anemia)       Imaging: Nm Myocar Multi W/spect W/wall Motion / Ef  03/27/2015    There was no ST segment deviation noted during stress.  Defect 1: There is a small defect of moderate severity present in the  basal inferior location. Due to soft tissue attenuation artifact.  This is a low risk study.  The  left ventricular ejection fraction is normal (55-65%).

## 2015-04-16 ENCOUNTER — Encounter (HOSPITAL_BASED_OUTPATIENT_CLINIC_OR_DEPARTMENT_OTHER): Payer: Medicare Other

## 2015-04-16 ENCOUNTER — Encounter (HOSPITAL_BASED_OUTPATIENT_CLINIC_OR_DEPARTMENT_OTHER): Payer: Medicare Other | Admitting: Hematology & Oncology

## 2015-04-16 ENCOUNTER — Encounter (HOSPITAL_COMMUNITY): Payer: Self-pay | Admitting: Hematology & Oncology

## 2015-04-16 VITALS — BP 136/76 | HR 80 | Temp 98.3°F | Resp 18 | Wt 298.4 lb

## 2015-04-16 DIAGNOSIS — E611 Iron deficiency: Secondary | ICD-10-CM | POA: Diagnosis not present

## 2015-04-16 DIAGNOSIS — D508 Other iron deficiency anemias: Secondary | ICD-10-CM

## 2015-04-16 DIAGNOSIS — D649 Anemia, unspecified: Secondary | ICD-10-CM | POA: Diagnosis not present

## 2015-04-16 DIAGNOSIS — I499 Cardiac arrhythmia, unspecified: Secondary | ICD-10-CM

## 2015-04-16 DIAGNOSIS — D509 Iron deficiency anemia, unspecified: Secondary | ICD-10-CM

## 2015-04-16 DIAGNOSIS — Z8572 Personal history of non-Hodgkin lymphomas: Secondary | ICD-10-CM

## 2015-04-16 DIAGNOSIS — Z86010 Personal history of colonic polyps: Secondary | ICD-10-CM | POA: Diagnosis not present

## 2015-04-16 DIAGNOSIS — C8330 Diffuse large B-cell lymphoma, unspecified site: Secondary | ICD-10-CM | POA: Diagnosis not present

## 2015-04-16 LAB — CBC WITH DIFFERENTIAL/PLATELET
Basophils Absolute: 0.1 10*3/uL (ref 0.0–0.1)
Basophils Relative: 1 % (ref 0–1)
Eosinophils Absolute: 0.2 10*3/uL (ref 0.0–0.7)
Eosinophils Relative: 6 % — ABNORMAL HIGH (ref 0–5)
HCT: 41.1 % (ref 39.0–52.0)
Hemoglobin: 14.1 g/dL (ref 13.0–17.0)
Lymphocytes Relative: 27 % (ref 12–46)
Lymphs Abs: 1.1 10*3/uL (ref 0.7–4.0)
MCH: 31 pg (ref 26.0–34.0)
MCHC: 34.3 g/dL (ref 30.0–36.0)
MCV: 90.3 fL (ref 78.0–100.0)
Monocytes Absolute: 0.7 10*3/uL (ref 0.1–1.0)
Monocytes Relative: 16 % — ABNORMAL HIGH (ref 3–12)
Neutro Abs: 2.1 10*3/uL (ref 1.7–7.7)
Neutrophils Relative %: 50 % (ref 43–77)
Platelets: 172 10*3/uL (ref 150–400)
RBC: 4.55 MIL/uL (ref 4.22–5.81)
RDW: 21.9 % — ABNORMAL HIGH (ref 11.5–15.5)
WBC: 4.2 10*3/uL (ref 4.0–10.5)

## 2015-04-16 LAB — IRON AND TIBC
Iron: 71 ug/dL (ref 45–182)
Saturation Ratios: 20 % (ref 17.9–39.5)
TIBC: 351 ug/dL (ref 250–450)
UIBC: 280 ug/dL

## 2015-04-16 LAB — COMPREHENSIVE METABOLIC PANEL
ALT: 31 U/L (ref 17–63)
AST: 29 U/L (ref 15–41)
Albumin: 4.1 g/dL (ref 3.5–5.0)
Alkaline Phosphatase: 64 U/L (ref 38–126)
Anion gap: 8 (ref 5–15)
BUN: 13 mg/dL (ref 6–20)
CO2: 27 mmol/L (ref 22–32)
Calcium: 8.8 mg/dL — ABNORMAL LOW (ref 8.9–10.3)
Chloride: 103 mmol/L (ref 101–111)
Creatinine, Ser: 1.09 mg/dL (ref 0.61–1.24)
GFR calc Af Amer: >60 mL/min (ref >60)
GFR calc non Af Amer: >60 mL/min (ref >60)
Glucose, Bld: 134 mg/dL — ABNORMAL HIGH (ref 65–99)
Potassium: 3.5 mmol/L (ref 3.5–5.1)
Sodium: 138 mmol/L (ref 135–145)
Total Bilirubin: 0.6 mg/dL (ref 0.3–1.2)
Total Protein: 7.2 g/dL (ref 6.5–8.1)

## 2015-04-16 LAB — FERRITIN: FERRITIN: 114 ng/mL (ref 24–336)

## 2015-04-16 NOTE — Patient Instructions (Signed)
[place] at [facility] Discharge Instructions  RECOMMENDATIONS MADE BY THE CONSULTANT AND ANY TEST RESULTS WILL BE SENT TO YOUR REFERRING PHYSICIAN.  Exam and discussion by Dr. Whitney Muse Your blood counts are stable. Report night sweats, fevers, increased weakness, increased shortness of breath or other concerns.  Follow-up: Labs every 2 months and office visit in 6 months.  Thank you for choosing [place] at [facility] to provide your oncology and hematology care.  To afford each patient quality time with our provider, please arrive at least 15 minutes before your scheduled appointment time.    You need to re-schedule your appointment should you arrive 10 or more minutes late.  We strive to give you quality time with our providers, and arriving late affects you and other patients whose appointments are after yours.  Also, if you no show three or more times for appointments you may be dismissed from the clinic at the providers discretion.     Again, thank you for choosing [facility].  Our hope is that these requests will decrease the amount of time that you wait before being seen by our physicians.       _____________________________________________________________  Should you have questions after your visit to [facility], please contact our office at [phone] between the hours of 8:30 a.m. and 4:30 p.m.  Voicemails left after 4:30 p.m. will not be returned until the following business day.  For prescription refill requests, have your pharmacy contact our office.

## 2015-04-16 NOTE — Progress Notes (Signed)
LABS DRAWN

## 2015-04-16 NOTE — Progress Notes (Signed)
[name], MD [place] [address]  Diffuse large B cell lymphoma, Stage IV disease Treated in 2006 Iron deficiency anemia Colonoscopy and EGD 03/08/2015 Pillcam 03/08/2015 with probable AVM's  CURRENT THERAPY: Surveillance, IV iron prn  INTERVAL HISTORY: [name] 64 y.o. male returns for  regular  visit for followup of Diffuse large B-cell lymphoma presenting with extensive stage IV a disease including thoracic vertebral body involvement with biopsy on 02/04/2005. He had surgical stabilization of his thoracic spine followed by chemotherapy with R. CHOP. He went on to have ICE chemotherapy pre-transplant followed by total body irradiation followed by stem cell transplant on 01/15/2006. This was an autologous stem cell transplant. We then maintain him on rituximab for 2 years. He did not get a complete remission with the R. CHOP chemotherapy initially. His last PET scan was in November 2011.  The patient will discuss the results of his biopsies and pill cam with Dr. Sydell Axon on 8/30.  He states that all of the procedures from his cardiologist returned negative.  He has no complaints at this time. His energy level is improved with his iron replacement.   Oncology History   S/P chemotherapy following stabilization of the spine.  Initial biopsy was on 07/13/2005 on his bone marrow.  Thoracic vertebra was biopsied on 02/04/2005 and mediastinum on 02/03/2005.  S/P R-CHOP and achieved an incomplete response, then underwent ICE chemotherapy pre-transplant and total body radiation with autologous stem cell transplant on 01/15/2006 and maintained on Rituxan for 2 years.  Thus far without recurrence.     DLBCL (diffuse large B cell lymphoma)   01/27/2005 Imaging PET- diffuse activity  involving the L cervical area, extensive activity in the mediastinumn particularly in the R paratracheal area and to a lesser degree in the anterior mediastinum.  Lg area in porta caval nodes  in upper abd and diffuse bone activity   02/03/2005 Pathology Results Mediastinal lymph node demonstrating diffuse large cell lymphoma, B-type.  Vertebral bone biopsy demonstrating diffuse large b-cell lymphoma   03/11/2005 - 08/04/2005 Chemotherapy R-CHOP x 8 cycles   05/04/2005 Imaging PET- near resolution of previously identified uptake.   07/13/2005 Bone Marrow Biopsy Bone marrow aspiration and biopsy is negative for lymphoma   08/31/2005 Imaging PET- further response to therapy with findings consistent with residual disease.  Hypermetabolic activity identified within the anterior mediastinal lymph node (smaller but still pathologic).  Borderline hypermetabolic small right pleural effusion   10/20/2005 - 11/13/2005 Chemotherapy R-ICE x 2 cycles at [facility]   01/08/2006 - 01/09/2006 Chemotherapy Cyclophosphamide 600 mg daily x 2   01/10/2006 - 01/14/2006 Radiation Therapy Total body radiation twice daily   01/15/2006 Bone Marrow Transplant Stem cell transplant at [facility]   04/05/2006 - 12/05/2007 Chemotherapy Rituxan weekly x 4 every 6 months x 2 years    Past Medical History  Diagnosis Date  . Hypertension   . Asthma   . 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 Venous insufficiency 04/29/2011  . Obesity, morbid (more than 100 lbs over ideal weight or BMI > 40)   . Edema   . Heart failure, diastolic, chronic     patient denies  . Hyperlipidemia, mixed     has DLBCL (diffuse large B cell lymphoma); HYPERLIPIDEMIA-MIXED; OBESITY-MORBID (>100'); Anemia; HYPERTENSION, UNSPECIFIED; DIASTOLIC HEART FAILURE, CHRONIC; EDEMA; Venous insufficiency; Hx of adenomatous colonic polyps; Mucosal abnormality of stomach; History of colonic polyps; IDA (iron deficiency anemia); and Essential  hypertension on his problem list.     is allergic to penicillins.  [name] does not currently have medications on file.  Past Surgical History  Procedure Laterality Date  . Multiple tooth  extractions  04/2005  . Back surgery  2006    T6 vertebrae removed/titanium placed  . Lung biopsy  6/06  . Colonoscopy  11/24/2004    Polyps in the left colon ablated/removed as described above.  Two submucosal lesions consistent with lipomas as described above not  manipulated./ Normal rectum  . Port a cath placement    . Colonoscopy  06/20/2012    MULTIPLE RECTAL AND COLONIC POLYPS  . Limbal stem cell transplant    . Port-a-cath removal  09/08/2012    Procedure: REMOVAL PORT-A-CATH;  Surgeon: Melrose Nakayama, MD;  Location: [place];  Service: Thoracic;  Laterality: N/A;  . Colonoscopy N/A 03/08/2015    Procedure: COLONOSCOPY;  Surgeon: Daneil Dolin, MD;  [facility];  Service: Endoscopy;  Laterality: N/A;  930  . Esophagogastroduodenoscopy N/A 03/08/2015    Procedure: ESOPHAGOGASTRODUODENOSCOPY (EGD);  Surgeon: Daneil Dolin, MD;  [facility];  Service: Endoscopy;  Laterality: N/A;  . Givens capsule study N/A 03/08/2015    Procedure: GIVENS CAPSULE STUDY;  Surgeon: Daneil Dolin, MD;  [facility];  Service: Endoscopy;  Laterality: N/A;   Social History: He has been married for 32 years with 2 children  ROS: Denies any headaches, dizziness, double vision, fevers, chills, night sweats, nausea, vomiting, diarrhea, constipation, chest pain, heart palpitations, shortness of breath, blood in stool,  urinary pain, urinary burning, urinary frequency, hematuria. Positive for malaise/fatigue (improved) 14 point review of systems was performed and is negative except as detailed under history of present illness and above    PHYSICAL EXAMINATION  ECOG PERFORMANCE STATUS: 0 - Asymptomatic  Filed Vitals:   04/16/15 1328  BP: 136/76  Pulse: 80  Temp: 98.3 F (36.8 C)  Resp: 18    GENERAL:alert, no distress, well nourished, well developed, comfortable, cooperative, obese and smiling SKIN: skin color, texture, turgor are normal, no rashes or significant  lesions HEAD: Normocephalic, No masses, lesions, tenderness or abnormalities EYES: normal, PERRLA, EOMI, Conjunctiva are pink and non-injected EARS: External ears normal OROPHARYNX:mucous membranes are moist  NECK: supple, no adenopathy, thyroid normal size, non-tender, without nodularity, no stridor, non-tender, trachea midline LYMPH:  no palpable lymphadenopathy, no hepatosplenomegaly BREAST:gynecomastia noted LUNGS: clear to auscultation and percussion HEART: no murmurs, no gallops, S1 normal and S2 normal ABDOMEN:abdomen soft, non-tender, obese, normal bowel sounds, no masses or organomegaly and body habitus prohibits good abdominal exam BACK: Back symmetric, no curvature., No CVA tenderness EXTREMITIES:less then 2 second capillary refill, no joint deformities, effusion, or inflammation, no clubbing, no cyanosis NEURO: alert & oriented x 3 with fluent speech, no focal motor/sensory deficits, gait normal   LABORATORY DATA: CBC    Component Value Date/Time   WBC 3.9* 03/05/2015 1258   RBC 4.39 03/05/2015 1258   RBC 3.87* 01/10/2015 1023   HGB 11.6* 03/05/2015 1258   HCT 36.7* 03/05/2015 1258   PLT 187 03/05/2015 1258   MCV 83.6 03/05/2015 1258   MCH 26.4 03/05/2015 1258   MCHC 31.6 03/05/2015 1258   RDW 26.8* 03/05/2015 1258   LYMPHSABS 0.9 03/05/2015 1258   MONOABS 0.5 03/05/2015 1258   EOSABS 0.2 03/05/2015 1258   BASOSABS 0.0 03/05/2015 1258      Chemistry      Component Value  Date/Time   NA 140 03/06/2015 1620   K 3.1* 03/06/2015 1620   CL 104 03/06/2015 1620   CO2 27 03/06/2015 1620   BUN 13 03/06/2015 1620   CREATININE 1.19 03/06/2015 1620      Component Value Date/Time   CALCIUM 8.5* 03/06/2015 1620   ALKPHOS 75 01/10/2015 0852   AST 24 01/10/2015 0852   ALT 25 01/10/2015 0852   BILITOT 0.5 01/10/2015 0852      Results for [name], Frank BIALAS (MRN [id number]) as of 01/11/2014 10:12  Ref. Range 01/11/2014 09:09  LDH Latest Range: 94-250 U/L 217       ASSESSMENT:  Stage IV DLBCL Anemia HIstory of Colonic polyps Iron deficiency with serum ferritin of 11 ng/ml First Degree AV block  His lymphoma was back in 2006 and at this point he is considered cured. He did undergo a bone marrow transplant and therefore continuing with observation and blood counts is reasonable  He has been diagnosed with iron deficiency and has received IV iron with improvement in his counts.  He had developed a significant anemia with a hemoglobin of 9.4 and a serum ferritin of 11. Pillcam is suggests AVM's.  He has been evaluated by cardiology. He is currently doing well.    We will proceed with a CBC and ferritin level every 2 months. He will be notified of the results of his ferritin when it is available from today. If he needs additional iron replacement we will arrange for this. I will tentatively see him back in 6 months for physical exam and office visit.   Patient Active Problem List   Diagnosis Date Noted  . Essential hypertension 04/03/2015  . IDA (iron deficiency anemia)   . Mucosal abnormality of stomach   . History of colonic polyps   . Hx of adenomatous colonic polyps 06/13/2012  . Venous insufficiency 04/29/2011  . DLBCL (diffuse large B cell lymphoma) 02/28/2009  . HYPERLIPIDEMIA-MIXED 02/28/2009  . OBESITY-MORBID (>100') 02/28/2009  . Anemia 02/28/2009  . HYPERTENSION, UNSPECIFIED 02/28/2009  . DIASTOLIC HEART FAILURE, CHRONIC 02/28/2009  . EDEMA 02/28/2009    All questions were answered. The patient knows to call the clinic with any problems, questions or concerns. We can certainly see the patient much sooner if necessary.   This document serves as a record of services personally performed by Ancil Linsey, MD. It was created on her behalf by Janace Hoard, a trained medical scribe. The creation of this record is based on the scribe's personal observations and the provider's statements to them. This document has been checked and  approved by the attending provider.  I have reviewed the above documentation for accuracy and completeness, and I agree with the above.  This note was electronically signed.   Kelby Fam. Brooklyn Jeff MD

## 2015-04-23 ENCOUNTER — Encounter: Payer: Self-pay | Admitting: Nurse Practitioner

## 2015-04-23 ENCOUNTER — Ambulatory Visit (INDEPENDENT_AMBULATORY_CARE_PROVIDER_SITE_OTHER): Payer: Medicare Other | Admitting: Nurse Practitioner

## 2015-04-23 VITALS — BP 145/67 | HR 75 | Temp 97.8°F | Ht 71.0 in | Wt 292.0 lb

## 2015-04-23 DIAGNOSIS — D509 Iron deficiency anemia, unspecified: Secondary | ICD-10-CM | POA: Diagnosis not present

## 2015-04-23 NOTE — Progress Notes (Signed)
Referring Provider: Redmond School, MD Primary Care Physician:  [name], MD Primary GI:  Dr. Gala Romney  Chief Complaint  Patient presents with  . Follow-up    HPI:   64 year old male presents for follow-up on anemia and status post endoscopic procedures. Colonoscopy completed 03/08/2015 which found redundant, capacious colon, marginal prep, multiple colon and rectosigmoid polyps removed, colonic lipoma, abnormal appearing terminal ileum likely a variant of normal status post biopsy. Endoscopy completed the same day found hiatal hernia, polypoid gastric mucosa with multiple polyps and the largest polyp removed with hemostasis clip placement, gastric biopsy.   Pathology of multiple polyps found a mix of benign inflammation type polyp with prolapse changes, hyperplastic. Ileum biopsy found benign small bowel mucosa with associated prominent benign lymphoid aggregates. Stomach biopsies found benign gastric mucosa with chronic inactive gastritis and inflammatory hyperplastic polyps, all negative for H. pylori.   Capsule endoscopy was a complete study. Somewhat limited due to debris. 1 questionable N1 likely definite AVM found and deemed likely culprit for IDA. Possible delayed gastric emptying. Some areas appeared to have "old blood" likely residual from previous biopsies. Recommendation includes avoidance of all NSAIDs and aspirin powders, close follow-up with hematology in GI, repeat CBC and iron studies in 6 weeks. Hold on referral to [facility] as AVMs are questionable and very sparse. Last CBC completed a 20 11/11/2014 which had a normal H&H of 14.1/41.1. CMP essentially normal. Iron and TIBC all normal. Ferritin normal as well.  Today he states he is not having abdominal pain, N/V, hematochezia, melena, unintentional weight loss, fevers. Denies chest pain. Dyspnea better with improved iron levels. Denies lightheadedness, dizziness, syncope, and near syncope. Denies changes in bowel habits.  Denies any other upper or lowe GI symptoms. States he feels pretty good, other than needing to lose some weight.  Past Medical History  Diagnosis Date  . Hypertension   . Asthma   . 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 Venous insufficiency 04/29/2011  . Obesity, morbid (more than 100 lbs over ideal weight or BMI > 40)   . Edema   . Heart failure, diastolic, chronic     patient denies  . Hyperlipidemia, mixed     Past Surgical History  Procedure Laterality Date  . Multiple tooth extractions  04/2005  . Back surgery  2006    T6 vertebrae removed/titanium placed  . Lung biopsy  6/06  . Colonoscopy  11/24/2004    Polyps in the left colon ablated/removed as described above.  Two submucosal lesions consistent with lipomas as described above not  manipulated./ Normal rectum  . Port a cath placement    . Colonoscopy  06/20/2012    MULTIPLE RECTAL AND COLONIC POLYPS  . Limbal stem cell transplant    . Port-a-cath removal  09/08/2012    Procedure: REMOVAL PORT-A-CATH;  Surgeon: Melrose Nakayama, MD;  Location: [place];  Service: Thoracic;  Laterality: N/A;  . Colonoscopy N/A 03/08/2015    RMR: Capacious, redundant colon. Multiple colonic and rectosigmoid polyps removed. ablated as described above. colonic lipoma abnormal appearing terminal ileum likely a variant of normal). Howeverwith history  biopsies obtained.   . Esophagogastroduodenoscopy N/A 03/08/2015    RMR: Hiatal hernia Polypoid gastric mucosa with multiple gastric polyps. largest polyp removed via snare polypectomgy hemostasis clip placed at base. Status post gastric biopsy. Status post video capsule placement.   . Givens capsule study N/A 03/08/2015    Procedure: GIVENS CAPSULE  STUDY;  Surgeon: Daneil Dolin, MD;  [facility];  Service: Endoscopy;  Laterality: N/A;    Current Outpatient Prescriptions  Medication Sig Dispense Refill  . baclofen (LIORESAL) 10 MG tablet Take 10 mg by  mouth 3 (three) times daily as needed. For hand cramps    . hydrochlorothiazide (HYDRODIURIL) 25 MG tablet Take 25 mg by mouth daily.  0  . ibuprofen (ADVIL,MOTRIN) 200 MG tablet Take 200 mg by mouth every 6 (six) hours as needed. Use seldom. For pain    . potassium chloride (K-DUR) 10 MEQ tablet Take 40 meq TODAY 7/15, then take 10 meq daily 90 tablet 3   No current facility-administered medications for this visit.    Allergies as of 04/23/2015 - Review Complete 04/23/2015  Allergen Reaction Noted  . Penicillins Rash 04/29/2011    Family History  Problem Relation Age of Onset  . Cancer Mother     lung  . Cancer Father     prostate  . Colon cancer Neg Hx     Social History   Social History  . Marital Status: Married    Spouse Name: N/A  . Number of Children: 2  . Years of Education: N/A   Occupational History  . disability due to back    Social History Main Topics  . Smoking status: Former Smoker -- 0.50 packs/day for 15 years    Quit date: 11/11/2002  . Smokeless tobacco: Never Used  . Alcohol Use: No  . Drug Use: No  . Sexual Activity: Not Asked   Other Topics Concern  . None   Social History Narrative   Married   No regular exercise    Review of Systems: General: Negative for anorexia, weight loss, fever, chills, fatigue, weakness. CV: Negative for chest pain, angina, palpitations, dyspnea on exertion, peripheral edema.  Respiratory: Negative for dyspnea at rest, dyspnea on exertion, cough, sputum, wheezing.  GI: See history of present illness. Endo: Negative for unusual weight change.  Heme: Negative for bruising or bleeding.   Physical Exam: BP 145/67 mmHg  Pulse 75  Temp(Src) 97.8 F (36.6 C)  Ht '5\' 11"'$  (1.803 m)  Wt 292 lb (132.45 kg)  BMI 40.74 kg/m2 General:   Alert and oriented. Pleasant and cooperative. Well-nourished and well-developed.  Cardiovascular:  S1, S2 present without murmurs appreciated. Normal pulses noted. Extremities without  clubbing or edema. Respiratory:  Clear to auscultation bilaterally. No wheezes, rales, or rhonchi. No distress.  Gastrointestinal:  +BS, soft, non-tender and non-distended. No HSM noted. No guarding or rebound. No masses appreciated.  Rectal:  Deferred  Psych:  Alert and cooperative. Normal mood and affect. Heme/Lymph/Immune: No excessive bruising noted.    04/23/2015 9:20 AM

## 2015-04-23 NOTE — Assessment & Plan Note (Signed)
Patient with a history of iron deficiency anemia followed actively by hematology. The referred him to [place] for GI evaluation. He completed colonoscopy with endoscopy and capsule study. Only likely etiology for bleeding are sparse AVMs, only two noted. It was decided to hold off on sending him to [facility] due to the rarity of his AVMs. Last labs checked about a week ago by hematology and all iron studies, ferritin, and CBC are normal. We will continue to follow for any changes and necessary further evaluation or referral. Return for follow-up in 6 months. Instructed the patient to call if any changes such as blood in stool, Elin

## 2015-04-23 NOTE — Patient Instructions (Signed)
1. Return for follow-up in 6 months. 2. Notify us if you have any changes such as blood in her stool or black tarry sticky stools. 3. Continue follow-up with hematology.

## 2015-04-23 NOTE — Progress Notes (Signed)
CC'ED TO PCP 

## 2015-05-14 ENCOUNTER — Other Ambulatory Visit (HOSPITAL_COMMUNITY): Payer: Medicare Other

## 2015-06-05 ENCOUNTER — Other Ambulatory Visit (HOSPITAL_COMMUNITY): Payer: Self-pay

## 2015-06-11 ENCOUNTER — Encounter (HOSPITAL_COMMUNITY): Payer: Medicare Other | Attending: Hematology & Oncology

## 2015-06-11 ENCOUNTER — Other Ambulatory Visit (HOSPITAL_COMMUNITY): Payer: Medicare Other

## 2015-06-11 DIAGNOSIS — D649 Anemia, unspecified: Secondary | ICD-10-CM | POA: Insufficient documentation

## 2015-06-11 DIAGNOSIS — D509 Iron deficiency anemia, unspecified: Secondary | ICD-10-CM

## 2015-06-11 DIAGNOSIS — C8330 Diffuse large B-cell lymphoma, unspecified site: Secondary | ICD-10-CM | POA: Insufficient documentation

## 2015-06-11 LAB — CBC WITH DIFFERENTIAL/PLATELET
BASOS PCT: 1 %
Basophils Absolute: 0.1 10*3/uL (ref 0.0–0.1)
Eosinophils Absolute: 0.3 10*3/uL (ref 0.0–0.7)
Eosinophils Relative: 7 %
HCT: 39.7 % (ref 39.0–52.0)
HEMOGLOBIN: 13.6 g/dL (ref 13.0–17.0)
Lymphocytes Relative: 22 %
Lymphs Abs: 0.8 10*3/uL (ref 0.7–4.0)
MCH: 32.2 pg (ref 26.0–34.0)
MCHC: 34.3 g/dL (ref 30.0–36.0)
MCV: 93.9 fL (ref 78.0–100.0)
Monocytes Absolute: 0.5 10*3/uL (ref 0.1–1.0)
Monocytes Relative: 13 %
NEUTROS PCT: 57 %
Neutro Abs: 2.2 10*3/uL (ref 1.7–7.7)
Platelets: 168 10*3/uL (ref 150–400)
RBC: 4.23 MIL/uL (ref 4.22–5.81)
RDW: 14.7 % (ref 11.5–15.5)
WBC: 3.8 10*3/uL — AB (ref 4.0–10.5)

## 2015-06-11 LAB — FERRITIN: FERRITIN: 43 ng/mL (ref 24–336)

## 2015-06-13 NOTE — Progress Notes (Signed)
LABS DRAWN

## 2015-06-14 ENCOUNTER — Other Ambulatory Visit (HOSPITAL_COMMUNITY): Payer: Self-pay | Admitting: Hematology & Oncology

## 2015-06-20 ENCOUNTER — Encounter (HOSPITAL_BASED_OUTPATIENT_CLINIC_OR_DEPARTMENT_OTHER): Payer: Medicare Other

## 2015-06-20 VITALS — BP 124/64 | HR 83 | Temp 98.4°F | Resp 20

## 2015-06-20 DIAGNOSIS — D509 Iron deficiency anemia, unspecified: Secondary | ICD-10-CM | POA: Diagnosis present

## 2015-06-20 DIAGNOSIS — Z23 Encounter for immunization: Secondary | ICD-10-CM

## 2015-06-20 MED ORDER — SODIUM CHLORIDE 0.9 % IV SOLN
INTRAVENOUS | Status: DC
  Administered 2015-06-20: 13:00:00 via INTRAVENOUS

## 2015-06-20 MED ORDER — SODIUM CHLORIDE 0.9 % IV SOLN
510.0000 mg | Freq: Once | INTRAVENOUS | Status: AC
  Administered 2015-06-20: 510 mg via INTRAVENOUS
  Filled 2015-06-20: qty 17

## 2015-06-20 MED ORDER — INFLUENZA VAC SPLIT QUAD 0.5 ML IM SUSY
0.5000 mL | PREFILLED_SYRINGE | Freq: Once | INTRAMUSCULAR | Status: AC
  Administered 2015-06-20: 0.5 mL via INTRAMUSCULAR
  Filled 2015-06-20: qty 0.5

## 2015-06-20 NOTE — Progress Notes (Signed)
Tolerated iron infusion well. [name] presents today for injection per MD orders. Fluarix vaccine administered IM in left Upper Arm. Administration without incident. Patient tolerated well.

## 2015-06-20 NOTE — Patient Instructions (Signed)
[place] at [facility] Discharge Instructions  RECOMMENDATIONS MADE BY THE CONSULTANT AND ANY TEST RESULTS WILL BE SENT TO YOUR REFERRING PHYSICIAN.  Ferritin (iron) level 43. You received Feraheme 510 mg iron infusion today as ordered. Return as scheduled.  Thank you for choosing [place] at [facility] to provide your oncology and hematology care.  To afford each patient quality time with our provider, please arrive at least 15 minutes before your scheduled appointment time.    You need to re-schedule your appointment should you arrive 10 or more minutes late.  We strive to give you quality time with our providers, and arriving late affects you and other patients whose appointments are after yours.  Also, if you no show three or more times for appointments you may be dismissed from the clinic at the providers discretion.     Again, thank you for choosing [facility].  Our hope is that these requests will decrease the amount of time that you wait before being seen by our physicians.       _____________________________________________________________  Should you have questions after your visit to [facility], please contact our office at [phone] between the hours of 8:30 a.m. and 4:30 p.m.  Voicemails left after 4:30 p.m. will not be returned until the following business day.  For prescription refill requests, have your pharmacy contact our office.

## 2015-07-09 ENCOUNTER — Other Ambulatory Visit (HOSPITAL_COMMUNITY): Payer: Medicare Other

## 2015-08-06 ENCOUNTER — Encounter (HOSPITAL_COMMUNITY): Payer: Medicare Other | Attending: Hematology & Oncology

## 2015-08-06 DIAGNOSIS — C8330 Diffuse large B-cell lymphoma, unspecified site: Secondary | ICD-10-CM | POA: Diagnosis not present

## 2015-08-06 DIAGNOSIS — D649 Anemia, unspecified: Secondary | ICD-10-CM | POA: Insufficient documentation

## 2015-08-06 DIAGNOSIS — D509 Iron deficiency anemia, unspecified: Secondary | ICD-10-CM

## 2015-08-06 LAB — CBC WITH DIFFERENTIAL/PLATELET
BASOS ABS: 0.1 10*3/uL (ref 0.0–0.1)
BASOS PCT: 1 %
EOS ABS: 0.2 10*3/uL (ref 0.0–0.7)
EOS PCT: 3 %
HCT: 34.5 % — ABNORMAL LOW (ref 39.0–52.0)
HEMOGLOBIN: 11.9 g/dL — AB (ref 13.0–17.0)
LYMPHS ABS: 1.1 10*3/uL (ref 0.7–4.0)
Lymphocytes Relative: 22 %
MCH: 35.2 pg — ABNORMAL HIGH (ref 26.0–34.0)
MCHC: 34.5 g/dL (ref 30.0–36.0)
MCV: 102.1 fL — ABNORMAL HIGH (ref 78.0–100.0)
Monocytes Absolute: 0.7 10*3/uL (ref 0.1–1.0)
Monocytes Relative: 13 %
NEUTROS PCT: 61 %
Neutro Abs: 3.2 10*3/uL (ref 1.7–7.7)
PLATELETS: 205 10*3/uL (ref 150–400)
RBC: 3.38 MIL/uL — AB (ref 4.22–5.81)
RDW: 16.4 % — ABNORMAL HIGH (ref 11.5–15.5)
WBC: 5.2 10*3/uL (ref 4.0–10.5)

## 2015-08-06 LAB — FERRITIN: FERRITIN: 219 ng/mL (ref 24–336)

## 2015-08-09 ENCOUNTER — Other Ambulatory Visit (HOSPITAL_COMMUNITY): Payer: Self-pay

## 2015-08-09 DIAGNOSIS — D649 Anemia, unspecified: Secondary | ICD-10-CM

## 2015-08-15 ENCOUNTER — Encounter (HOSPITAL_COMMUNITY): Payer: Medicare Other

## 2015-08-15 DIAGNOSIS — D649 Anemia, unspecified: Secondary | ICD-10-CM | POA: Diagnosis not present

## 2015-08-15 DIAGNOSIS — C8330 Diffuse large B-cell lymphoma, unspecified site: Secondary | ICD-10-CM | POA: Diagnosis not present

## 2015-08-15 LAB — FOLATE: Folate: 21.5 ng/mL (ref >5.9)

## 2015-08-15 LAB — RETICULOCYTES
RBC.: 3.25 MIL/uL — ABNORMAL LOW (ref 4.22–5.81)
RETIC COUNT ABSOLUTE: 156 10*3/uL (ref 19.0–186.0)
RETIC CT PCT: 4.8 % — AB (ref 0.4–3.1)

## 2015-08-15 LAB — VITAMIN B12: VITAMIN B 12: 426 pg/mL (ref 180–914)

## 2015-09-02 ENCOUNTER — Encounter: Payer: Self-pay | Admitting: Internal Medicine

## 2015-10-01 ENCOUNTER — Encounter (HOSPITAL_COMMUNITY): Payer: Medicare Other | Attending: Hematology & Oncology | Admitting: Hematology & Oncology

## 2015-10-01 ENCOUNTER — Encounter (HOSPITAL_COMMUNITY): Payer: Medicare Other

## 2015-10-01 ENCOUNTER — Encounter (HOSPITAL_COMMUNITY): Payer: Self-pay | Admitting: Hematology & Oncology

## 2015-10-01 VITALS — BP 156/77 | HR 84 | Temp 98.6°F | Resp 18 | Wt 304.8 lb

## 2015-10-01 DIAGNOSIS — D500 Iron deficiency anemia secondary to blood loss (chronic): Secondary | ICD-10-CM | POA: Diagnosis not present

## 2015-10-01 DIAGNOSIS — C8330 Diffuse large B-cell lymphoma, unspecified site: Secondary | ICD-10-CM

## 2015-10-01 DIAGNOSIS — Q2730 Arteriovenous malformation, site unspecified: Secondary | ICD-10-CM | POA: Diagnosis not present

## 2015-10-01 DIAGNOSIS — Z8572 Personal history of non-Hodgkin lymphomas: Secondary | ICD-10-CM

## 2015-10-01 DIAGNOSIS — D509 Iron deficiency anemia, unspecified: Secondary | ICD-10-CM

## 2015-10-01 DIAGNOSIS — D649 Anemia, unspecified: Secondary | ICD-10-CM | POA: Diagnosis not present

## 2015-10-01 LAB — CBC WITH DIFFERENTIAL/PLATELET
BASOS ABS: 0 10*3/uL (ref 0.0–0.1)
BASOS PCT: 1 %
EOS PCT: 5 %
Eosinophils Absolute: 0.2 10*3/uL (ref 0.0–0.7)
HCT: 37.4 % — ABNORMAL LOW (ref 39.0–52.0)
Hemoglobin: 13 g/dL (ref 13.0–17.0)
Lymphocytes Relative: 25 %
Lymphs Abs: 1 10*3/uL (ref 0.7–4.0)
MCH: 34.5 pg — ABNORMAL HIGH (ref 26.0–34.0)
MCHC: 34.8 g/dL (ref 30.0–36.0)
MCV: 99.2 fL (ref 78.0–100.0)
MONO ABS: 0.6 10*3/uL (ref 0.1–1.0)
Monocytes Relative: 15 %
Neutro Abs: 2.1 10*3/uL (ref 1.7–7.7)
Neutrophils Relative %: 54 %
PLATELETS: 147 10*3/uL — AB (ref 150–400)
RBC: 3.77 MIL/uL — AB (ref 4.22–5.81)
RDW: 13 % (ref 11.5–15.5)
WBC: 3.9 10*3/uL — AB (ref 4.0–10.5)

## 2015-10-01 LAB — FERRITIN: FERRITIN: 97 ng/mL (ref 24–336)

## 2015-10-01 NOTE — Progress Notes (Signed)
[facility] [place] [address] [place] [id number]  Diffuse large B cell lymphoma, Stage IV disease Treated in 2006 Iron deficiency anemia Colonoscopy and EGD 03/08/2015 Pillcam 03/08/2015 with probable AVM's  CURRENT THERAPY: Surveillance, IV iron prn  INTERVAL HISTORY: [name] 65 y.o. male returns for  regular  visit for followup of Diffuse large B-cell lymphoma presenting with extensive stage IV a disease including thoracic vertebral body involvement with biopsy on 02/04/2005. He had surgical stabilization of his thoracic spine followed by chemotherapy with R. CHOP. He went on to have ICE chemotherapy pre-transplant followed by total body irradiation followed by stem cell transplant on 01/15/2006. This was an autologous stem cell transplant. He received Rituxan for 2 years. He did not get a complete remission with the R. CHOP chemotherapy initially. His last PET scan was in November 2011.  He has also been diagnosed with iron deficiency anemia secondary to GI related blood loss. GI evaluation has shown AVM's.  [name] returns to the [place] alone today.  He says that he's feeling great. He says that his appetite is "too good" and that "everything's fine." He says that he goes back to get his heart checked out soon, but other than that, he's fine.   He says that he did good through the holidays with his weight, but then he guesses the snow got him. For the summer and spring, he has a 32 month old grandbaby that he plans on spending time with. He says he's very proud, and plans on taking care of himself so he can be there for his grandson.  He denies any blood in his stool. He denies any palpitations. No nausea or vomiting. No chest pain or new SOB.   He confirms that his legs are swelling and feel tight today. (chronic)   Oncology History   S/P chemotherapy following stabilization of the spine.   Initial biopsy was on 07/13/2005 on his bone marrow.  Thoracic vertebra was biopsied on 02/04/2005 and mediastinum on 02/03/2005.  S/P R-CHOP and achieved an incomplete response, then underwent ICE chemotherapy pre-transplant and total body radiation with autologous stem cell transplant on 01/15/2006 and maintained on Rituxan for 2 years.  Thus far without recurrence.     DLBCL (diffuse large B cell lymphoma) ([place])   01/27/2005 Imaging PET- diffuse activity  involving the L cervical area, extensive activity in the mediastinumn particularly in the R paratracheal area and to a lesser degree in the anterior mediastinum.  Lg area in porta caval nodes in upper abd and diffuse bone activity   02/03/2005 Pathology Results Mediastinal lymph node demonstrating diffuse large cell lymphoma, B-type.  Vertebral bone biopsy demonstrating diffuse large b-cell lymphoma   03/11/2005 - 08/04/2005 Chemotherapy R-CHOP x 8 cycles   05/04/2005 Imaging PET- near resolution of previously identified uptake.   07/13/2005 Bone Marrow Biopsy Bone marrow aspiration and biopsy is negative for lymphoma   08/31/2005 Imaging PET- further response to therapy with findings consistent with residual disease.  Hypermetabolic activity identified within the anterior mediastinal lymph node (smaller but still pathologic).  Borderline hypermetabolic small right pleural effusion   10/20/2005 - 11/13/2005 Chemotherapy R-ICE x 2 cycles at [facility]   01/08/2006 - 01/09/2006 Chemotherapy Cyclophosphamide 600 mg daily x 2   01/10/2006 - 01/14/2006 Radiation Therapy Total body radiation twice daily   01/15/2006 Bone Marrow Transplant Stem cell transplant at [facility]   04/05/2006 -  12/05/2007 Chemotherapy Rituxan weekly x 4 every 6 months x 2 years    Past Medical History  Diagnosis Date  . Hypertension   . Asthma   . Large cell lymphoma ([place]) 01/2005    autologous stem cell transplant 12/2005  . Venous insufficiency 04/29/2011  . Obesity, morbid (more than 100 lbs over  ideal weight or BMI > 40) (HCC)   . Edema   . Heart failure, diastolic, chronic ([place])     patient denies  . Hyperlipidemia, mixed     has DLBCL (diffuse large B cell lymphoma) ([place]); HYPERLIPIDEMIA-MIXED; OBESITY-MORBID (>100'); Anemia; HYPERTENSION, UNSPECIFIED; DIASTOLIC HEART FAILURE, CHRONIC; EDEMA; Venous insufficiency; Hx of adenomatous colonic polyps; Mucosal abnormality of stomach; History of colonic polyps; IDA (iron deficiency anemia); and Essential hypertension on his problem list.     is allergic to penicillins.  [name] does not currently have medications on file.  Past Surgical History  Procedure Laterality Date  . Multiple tooth extractions  04/2005  . Back surgery  2006    T6 vertebrae removed/titanium placed  . Lung biopsy  6/06  . Colonoscopy  11/24/2004    Polyps in the left colon ablated/removed as described above.  Two submucosal lesions consistent with lipomas as described above not  manipulated./ Normal rectum  . Port a cath placement    . Colonoscopy  06/20/2012    MULTIPLE RECTAL AND COLONIC POLYPS  . Limbal stem cell transplant    . Port-a-cath removal  09/08/2012    Procedure: REMOVAL PORT-A-CATH;  Surgeon: Melrose Nakayama, MD;  Location: [place];  Service: Thoracic;  Laterality: N/A;  . Colonoscopy N/A 03/08/2015    RMR: Capacious, redundant colon. Multiple colonic and rectosigmoid polyps removed. ablated as described above. colonic lipoma abnormal appearing terminal ileum likely a variant of normal). Howeverwith history  biopsies obtained.   . Esophagogastroduodenoscopy N/A 03/08/2015    RMR: Hiatal hernia Polypoid gastric mucosa with multiple gastric polyps. largest polyp removed via snare polypectomgy hemostasis clip placed at base. Status post gastric biopsy. Status post video capsule placement.   Freda Munro capsule study N/A 03/08/2015    Procedure: GIVENS CAPSULE STUDY;  Surgeon: Daneil Dolin, MD;  [facility];  Service: Endoscopy;   Laterality: N/A;   Social History: He has been married for 32 years with 2 children  ROS: Denies any headaches, dizziness, double vision, fevers, chills, night sweats, nausea, vomiting, diarrhea, constipation, chest pain, heart palpitations, shortness of breath, blood in stool,  urinary pain, urinary burning, urinary frequency, hematuria. Positive for malaise/fatigue (improved) 14 point review of systems was performed and is negative except as detailed under history of present illness and above   PHYSICAL EXAMINATION  ECOG PERFORMANCE STATUS: 0 - Asymptomatic  Filed Vitals:   10/01/15 1326  BP: 156/77  Pulse: 84  Temp: 98.6 F (37 C)  Resp: 18    GENERAL:alert, no distress, well nourished, well developed, comfortable, cooperative, obese and smiling SKIN: skin color, texture, turgor are normal, no rashes or significant lesions HEAD: Normocephalic, No masses, lesions, tenderness or abnormalities EYES: normal, PERRLA, EOMI, Conjunctiva are pink and non-injected EARS: External ears normal OROPHARYNX:mucous membranes are moist  NECK: supple, no adenopathy, thyroid normal size, non-tender, without nodularity, no stridor, non-tender, trachea midline LYMPH:  no palpable lymphadenopathy, no hepatosplenomegaly BREAST:gynecomastia noted LUNGS: clear to auscultation and percussion HEART: no murmurs, no gallops, S1 normal and S2 normal   Heart rate is regular today and sounds good. ABDOMEN:abdomen soft, non-tender, obese,  normal bowel sounds, no masses or organomegaly and body habitus prohibits good abdominal exam BACK: Back symmetric, no curvature., No CVA tenderness EXTREMITIES:less then 2 second capillary refill, no joint deformities, effusion, or inflammation, no clubbing, no cyanosis  Leg swelling. NEURO: alert & oriented x 3 with fluent speech, no focal motor/sensory deficits, gait normal   LABORATORY DATA: I have reviewed the data as listed.  CBC    Component Value Date/Time    WBC 3.9* 10/01/2015 1219   RBC 3.77* 10/01/2015 1219   RBC 3.25* 08/15/2015 0845   HGB 13.0 10/01/2015 1219   HCT 37.4* 10/01/2015 1219   PLT 147* 10/01/2015 1219   MCV 99.2 10/01/2015 1219   MCH 34.5* 10/01/2015 1219   MCHC 34.8 10/01/2015 1219   RDW 13.0 10/01/2015 1219   LYMPHSABS 1.0 10/01/2015 1219   MONOABS 0.6 10/01/2015 1219   EOSABS 0.2 10/01/2015 1219   BASOSABS 0.0 10/01/2015 1219      Chemistry      Component Value Date/Time   NA 138 04/16/2015 1318   K 3.5 04/16/2015 1318   CL 103 04/16/2015 1318   CO2 27 04/16/2015 1318   BUN 13 04/16/2015 1318   CREATININE 1.09 04/16/2015 1318      Component Value Date/Time   CALCIUM 8.8* 04/16/2015 1318   ALKPHOS 64 04/16/2015 1318   AST 29 04/16/2015 1318   ALT 31 04/16/2015 1318   BILITOT 0.6 04/16/2015 1318      Results for [name], [name] (MRN [id number]) as of 01/11/2014 10:12  Ref. Range 01/11/2014 09:09  LDH Latest Range: 94-250 U/L 217     ASSESSMENT:  H/O Stage IV DLBCL Iron deficiency anemia secondary to GI related blood loss AVM's HIstory of Colonic polyps Iron deficiency with serum ferritin of 11 ng/ml First Degree AV block  His lymphoma was back in 2006 and at this point he is considered cured. He did undergo a bone marrow transplant and therefore continuing with observation and blood counts is reasonable  He has been diagnosed with iron deficiency and has received IV iron with improvement in his counts.  He had developed a significant anemia with a hemoglobin of 9.4 and a serum ferritin of 11. Pillcam shows AVM's.  He has been evaluated by cardiology. He is currently doing well.    We will proceed with a CBC and ferritin level every 2 months. He will be notified of the results of his ferritin when it is available from today. If he needs additional iron replacement we will arrange for this. I will tentatively see him back in 6 months for physical exam and office visit.  Patient Active Problem List    Diagnosis Date Noted  . Essential hypertension 04/03/2015  . IDA (iron deficiency anemia)   . Mucosal abnormality of stomach   . History of colonic polyps   . Hx of adenomatous colonic polyps 06/13/2012  . Venous insufficiency 04/29/2011  . DLBCL (diffuse large B cell lymphoma) ([place]) 02/28/2009  . HYPERLIPIDEMIA-MIXED 02/28/2009  . OBESITY-MORBID (>100') 02/28/2009  . Anemia 02/28/2009  . HYPERTENSION, UNSPECIFIED 02/28/2009  . DIASTOLIC HEART FAILURE, CHRONIC 02/28/2009  . EDEMA 02/28/2009    Orders Placed This Encounter  Procedures  . CBC with Differential    Standing Status: Standing     Number of Occurrences: 15     Standing Expiration Date: 09/30/2016  . Ferritin    Standing Status: Standing     Number of Occurrences: 15     Standing Expiration  Date: 09/30/2016   All questions were answered. The patient knows to call the clinic with any problems, questions or concerns. We can certainly see the patient much sooner if necessary.   This document serves as a record of services personally performed by Ancil Linsey, MD. It was created on her behalf by Toni Amend, a trained medical scribe. The creation of this record is based on the scribe's personal observations and the provider's statements to them. This document has been checked and approved by the attending provider.  I have reviewed the above documentation for accuracy and completeness, and I agree with the above.  This note was electronically signed.   Kelby Fam. Lenny Bouchillon MD

## 2015-10-01 NOTE — Patient Instructions (Addendum)
[place] at [facility] Discharge Instructions  RECOMMENDATIONS MADE BY THE CONSULTANT AND ANY TEST RESULTS WILL BE SENT TO YOUR REFERRING PHYSICIAN.   Exam and discussion by Dr Whitney Muse today Ferritin is still pending, we will call you with those results Hemoglobin is 13.0 Return to see the doctor in 6 months Monthly lab work Please call the clinic if you have any questions or concerns   Thank you for choosing [place] at [facility] to provide your oncology and hematology care.  To afford each patient quality time with our provider, please arrive at least 15 minutes before your scheduled appointment time.   Beginning January 23rd 2017 lab work for the [facility] will be done in the  Main lab at [facility] on 1st floor. If you have a lab appointment with the [facility] please come in thru the  Main Entrance and check in at the main information desk  You need to re-schedule your appointment should you arrive 10 or more minutes late.  We strive to give you quality time with our providers, and arriving late affects you and other patients whose appointments are after yours.  Also, if you no show three or more times for appointments you may be dismissed from the clinic at the providers discretion.     Again, thank you for choosing [facility].  Our hope is that these requests will decrease the amount of time that you wait before being seen by our physicians.       _____________________________________________________________  Should you have questions after your visit to [facility], please contact our office at [phone] between the hours of 8:30 a.m. and 4:30 p.m.  Voicemails left after 4:30 p.m. will not be returned until the following business day.  For prescription refill requests, have your pharmacy contact our office.

## 2015-10-22 ENCOUNTER — Ambulatory Visit: Payer: Medicare Other | Admitting: Nurse Practitioner

## 2015-10-23 ENCOUNTER — Ambulatory Visit: Payer: Medicare Other | Admitting: Nurse Practitioner

## 2015-10-29 ENCOUNTER — Encounter (HOSPITAL_COMMUNITY): Payer: Medicare Other | Attending: Hematology & Oncology

## 2015-10-29 DIAGNOSIS — D649 Anemia, unspecified: Secondary | ICD-10-CM | POA: Diagnosis not present

## 2015-10-29 DIAGNOSIS — C8330 Diffuse large B-cell lymphoma, unspecified site: Secondary | ICD-10-CM | POA: Diagnosis not present

## 2015-10-29 LAB — CBC WITH DIFFERENTIAL/PLATELET
Basophils Absolute: 0 10*3/uL (ref 0.0–0.1)
Basophils Relative: 0 %
EOS ABS: 0.2 10*3/uL (ref 0.0–0.7)
EOS PCT: 4 %
HCT: 39.2 % (ref 39.0–52.0)
Hemoglobin: 13.7 g/dL (ref 13.0–17.0)
LYMPHS ABS: 0.9 10*3/uL (ref 0.7–4.0)
Lymphocytes Relative: 20 %
MCH: 33.4 pg (ref 26.0–34.0)
MCHC: 34.9 g/dL (ref 30.0–36.0)
MCV: 95.6 fL (ref 78.0–100.0)
MONO ABS: 0.6 10*3/uL (ref 0.1–1.0)
Monocytes Relative: 14 %
Neutro Abs: 2.8 10*3/uL (ref 1.7–7.7)
Neutrophils Relative %: 62 %
PLATELETS: 149 10*3/uL — AB (ref 150–400)
RBC: 4.1 MIL/uL — AB (ref 4.22–5.81)
RDW: 12.7 % (ref 11.5–15.5)
WBC: 4.5 10*3/uL (ref 4.0–10.5)

## 2015-10-29 LAB — FERRITIN: FERRITIN: 100 ng/mL (ref 24–336)

## 2015-11-07 ENCOUNTER — Ambulatory Visit: Payer: Medicare Other | Admitting: Nurse Practitioner

## 2015-11-21 ENCOUNTER — Encounter: Payer: Self-pay | Admitting: Nurse Practitioner

## 2015-11-21 ENCOUNTER — Ambulatory Visit (INDEPENDENT_AMBULATORY_CARE_PROVIDER_SITE_OTHER): Payer: Medicare Other | Admitting: Nurse Practitioner

## 2015-11-21 VITALS — BP 152/91 | HR 88 | Temp 98.4°F | Ht 71.0 in | Wt 289.6 lb

## 2015-11-21 DIAGNOSIS — D509 Iron deficiency anemia, unspecified: Secondary | ICD-10-CM | POA: Diagnosis not present

## 2015-11-21 NOTE — Assessment & Plan Note (Signed)
Asian with iron deficiency anemia, actively seen hematology. His labs look great as of a couple weeks ago with normal hemoglobin and normal ferritin. Not needed iron transfusion since December 2016. Generally asymptomatic from a GI standpoint today. Does take Advil-type medications about twice a month or less for knee pain. Recommend he minimize NSAID use. Colonoscopy up-to-date, next due 2021. Recommend continue seen hematology for ID and management, return for follow-up in one year or sooner if symptoms change.

## 2015-11-21 NOTE — Patient Instructions (Signed)
1. Minimize the use of ibuprofen-based medications as much as possible. 2. Keep seeing Dr. Whitney Muse with hematology. 3. Return for follow-up in one year. 4. He have any new stomach or colon symptoms were noticed any bleeding, call us and we can see you sooner.

## 2015-11-21 NOTE — Progress Notes (Signed)
Referring Provider: Redmond School, MD Primary Care Physician:  [name], MD Primary GI:  Dr. Gala Romney  Chief Complaint  Patient presents with  . Follow-up  . Other    IDA    HPI:   [name] is a 65 y.o. male who presents for follow-up on iron deficiency anemia and last seen in our office on 04/23/2015. Is also followed by hematology/oncology. At his last office visit he was generally asymptomatic. Workup from a GI perspective included colonoscopy, endoscopy, and capsule study with only findings related to possible etiology of anemia include sparse AVMs on the capsule study. The patient was made to not refer back to [facility] due to the sparsity of his AVMs. At that time his recent labs were normal.  Today he states he's doing well. Denies hematochezia, melena. Some darker stools, has been on iron supplementation with last infusion in December 2016. Denies excessive bruising. Denies abdominal pain, N/V, unintentional weight loss. No GERD symptoms. Gets excess gas with dairy consumption, only drinks it seldomly. Denies chest pain, dyspnea, dizziness, lightheadedness, syncope, near syncope. Denies any other upper or lower GI symptoms. Takes Ibuprofen rarely, twice a month or less.  Past Medical History  Diagnosis Date  . Hypertension   . Asthma   . Large cell lymphoma ([place]) 01/2005    autologous stem cell transplant 12/2005  . Venous insufficiency 04/29/2011  . Obesity, morbid (more than 100 lbs over ideal weight or BMI > 40) (HCC)   . Edema   . Heart failure, diastolic, chronic ([place])     patient denies  . Hyperlipidemia, mixed     Past Surgical History  Procedure Laterality Date  . Multiple tooth extractions  04/2005  . Back surgery  2006    T6 vertebrae removed/titanium placed  . Lung biopsy  6/06  . Colonoscopy  11/24/2004    Polyps in the left colon ablated/removed as described above.  Two submucosal lesions consistent with lipomas as described above not   manipulated./ Normal rectum  . Port a cath placement    . Colonoscopy  06/20/2012    MULTIPLE RECTAL AND COLONIC POLYPS  . Limbal stem cell transplant    . Port-a-cath removal  09/08/2012    Procedure: REMOVAL PORT-A-CATH;  Surgeon: Melrose Nakayama, MD;  Location: [place];  Service: Thoracic;  Laterality: N/A;  . Colonoscopy N/A 03/08/2015    RMR: Capacious, redundant colon. Multiple colonic and rectosigmoid polyps removed. ablated as described above. colonic lipoma abnormal appearing terminal ileum likely a variant of normal). Howeverwith history  biopsies obtained.   . Esophagogastroduodenoscopy N/A 03/08/2015    RMR: Hiatal hernia Polypoid gastric mucosa with multiple gastric polyps. largest polyp removed via snare polypectomgy hemostasis clip placed at base. Status post gastric biopsy. Status post video capsule placement.   Freda Munro capsule study N/A 03/08/2015    Procedure: GIVENS CAPSULE STUDY;  Surgeon: Daneil Dolin, MD;  [facility];  Service: Endoscopy;  Laterality: N/A;    Current Outpatient Prescriptions  Medication Sig Dispense Refill  . baclofen (LIORESAL) 10 MG tablet Take 10 mg by mouth 3 (three) times daily as needed. For hand cramps    . hydrochlorothiazide (HYDRODIURIL) 25 MG tablet Take 25 mg by mouth daily.  0  . ibuprofen (ADVIL,MOTRIN) 200 MG tablet Take 200 mg by mouth every 6 (six) hours as needed. Use seldom. For pain    . potassium chloride (K-DUR) 10 MEQ tablet Take 40 meq TODAY 7/15, then  take 10 meq daily 90 tablet 3   No current facility-administered medications for this visit.    Allergies as of 11/21/2015 - Review Complete 11/21/2015  Allergen Reaction Noted  . Penicillins Rash 04/29/2011    Family History  Problem Relation Age of Onset  . Cancer Mother     lung  . Cancer Father     prostate  . Colon cancer Neg Hx     Social History   Social History  . Marital Status: Married    Spouse Name: N/A  . Number of Children: 2  .  Years of Education: N/A   Occupational History  . disability due to back    Social History Main Topics  . Smoking status: Former Smoker -- 0.50 packs/day for 15 years    Quit date: 11/11/2002  . Smokeless tobacco: Never Used  . Alcohol Use: No  . Drug Use: No  . Sexual Activity: Not Asked   Other Topics Concern  . None   Social History Narrative   Married   No regular exercise    Review of Systems: General: Negative for anorexia, weight loss, fever, chills, fatigue, weakness. ENT: Negative for hoarseness, difficulty swallowing. CV: Negative for chest pain, angina, palpitations, peripheral edema.  Respiratory: Negative for dyspnea at rest, cough, sputum, wheezing.  GI: See history of present illness. MS: Admits chronic knee pain.  Endo: Negative for unusual weight change.  Heme: Negative for bruising or bleeding.   Physical Exam: BP 152/91 mmHg  Pulse 88  Temp(Src) 98.4 F (36.9 C)  Ht '5\' 11"'$  (1.803 m)  Wt 289 lb 9.6 oz (131.362 kg)  BMI 40.41 kg/m2 General:   Obese male, alert and oriented. Pleasant and cooperative. Well-nourished and well-developed.  Head:  Normocephalic and atraumatic. Cardiovascular:  S1, S2 present without murmurs appreciated. Extremities without clubbing or edema. Respiratory:  Clear to auscultation bilaterally. No wheezes, rales, or rhonchi. No distress.  Gastrointestinal:  +BS, rounded but soft, non-tender and non-distended. No HSM noted. No guarding or rebound. No masses appreciated.  Rectal:  Deferred  Neurologic:  Alert and oriented x4;  grossly normal neurologically. Psych:  Alert and cooperative. Normal mood and affect. Heme/Lymph/Immune: No excessive bruising noted.    11/21/2015 11:30 AM   Disclaimer: This note was dictated with voice recognition software. Similar sounding words can inadvertently be transcribed and may not be corrected upon review.

## 2015-11-21 NOTE — Progress Notes (Signed)
cc'ed to pcp °

## 2015-11-29 ENCOUNTER — Other Ambulatory Visit (HOSPITAL_COMMUNITY): Payer: Medicare Other

## 2015-12-02 ENCOUNTER — Encounter (HOSPITAL_COMMUNITY): Payer: Medicare Other | Attending: Hematology & Oncology

## 2015-12-02 DIAGNOSIS — C8330 Diffuse large B-cell lymphoma, unspecified site: Secondary | ICD-10-CM

## 2015-12-02 DIAGNOSIS — D649 Anemia, unspecified: Secondary | ICD-10-CM | POA: Diagnosis not present

## 2015-12-02 LAB — CBC WITH DIFFERENTIAL/PLATELET
BASOS ABS: 0 10*3/uL (ref 0.0–0.1)
Basophils Relative: 1 %
EOS ABS: 0.2 10*3/uL (ref 0.0–0.7)
EOS PCT: 5 %
HCT: 38.8 % — ABNORMAL LOW (ref 39.0–52.0)
Hemoglobin: 13.5 g/dL (ref 13.0–17.0)
LYMPHS ABS: 1 10*3/uL (ref 0.7–4.0)
LYMPHS PCT: 26 %
MCH: 32.3 pg (ref 26.0–34.0)
MCHC: 34.8 g/dL (ref 30.0–36.0)
MCV: 92.8 fL (ref 78.0–100.0)
MONO ABS: 0.5 10*3/uL (ref 0.1–1.0)
Monocytes Relative: 12 %
Neutro Abs: 2.1 10*3/uL (ref 1.7–7.7)
Neutrophils Relative %: 56 %
PLATELETS: 175 10*3/uL (ref 150–400)
RBC: 4.18 MIL/uL — ABNORMAL LOW (ref 4.22–5.81)
RDW: 13.4 % (ref 11.5–15.5)
WBC: 3.8 10*3/uL — ABNORMAL LOW (ref 4.0–10.5)

## 2015-12-02 LAB — FERRITIN: Ferritin: 147 ng/mL (ref 24–336)

## 2015-12-26 DIAGNOSIS — Z6838 Body mass index (BMI) 38.0-38.9, adult: Secondary | ICD-10-CM | POA: Diagnosis not present

## 2015-12-26 DIAGNOSIS — I10 Essential (primary) hypertension: Secondary | ICD-10-CM | POA: Diagnosis not present

## 2015-12-26 DIAGNOSIS — E1165 Type 2 diabetes mellitus with hyperglycemia: Secondary | ICD-10-CM | POA: Diagnosis not present

## 2015-12-26 DIAGNOSIS — Z1389 Encounter for screening for other disorder: Secondary | ICD-10-CM | POA: Diagnosis not present

## 2015-12-26 DIAGNOSIS — E782 Mixed hyperlipidemia: Secondary | ICD-10-CM | POA: Diagnosis not present

## 2015-12-30 ENCOUNTER — Other Ambulatory Visit (HOSPITAL_COMMUNITY): Payer: Medicare Other

## 2016-01-01 ENCOUNTER — Emergency Department (HOSPITAL_COMMUNITY)
Admission: EM | Admit: 2016-01-01 | Discharge: 2016-01-01 | Disposition: A | Discharge status: Home / Self Care | Payer: Medicare Other | Attending: Emergency Medicine | Admitting: Emergency Medicine

## 2016-01-01 ENCOUNTER — Encounter (HOSPITAL_COMMUNITY): Payer: Self-pay

## 2016-01-01 DIAGNOSIS — E119 Type 2 diabetes mellitus without complications: Secondary | ICD-10-CM | POA: Diagnosis not present

## 2016-01-01 DIAGNOSIS — J45909 Unspecified asthma, uncomplicated: Secondary | ICD-10-CM | POA: Diagnosis not present

## 2016-01-01 DIAGNOSIS — E1165 Type 2 diabetes mellitus with hyperglycemia: Secondary | ICD-10-CM

## 2016-01-01 DIAGNOSIS — R000 Tachycardia, unspecified: Secondary | ICD-10-CM | POA: Diagnosis not present

## 2016-01-01 DIAGNOSIS — Z87891 Personal history of nicotine dependence: Secondary | ICD-10-CM | POA: Insufficient documentation

## 2016-01-01 DIAGNOSIS — I10 Essential (primary) hypertension: Secondary | ICD-10-CM | POA: Insufficient documentation

## 2016-01-01 DIAGNOSIS — E785 Hyperlipidemia, unspecified: Secondary | ICD-10-CM | POA: Diagnosis not present

## 2016-01-01 DIAGNOSIS — R358 Other polyuria: Secondary | ICD-10-CM | POA: Insufficient documentation

## 2016-01-01 HISTORY — DX: Type 2 diabetes mellitus without complications: E11.9

## 2016-01-01 LAB — CBC WITH DIFFERENTIAL/PLATELET
Basophils Absolute: 0 10*3/uL (ref 0.0–0.1)
Basophils Relative: 1 %
EOS ABS: 0.1 10*3/uL (ref 0.0–0.7)
EOS PCT: 2 %
HCT: 49.2 % (ref 39.0–52.0)
HEMOGLOBIN: 17 g/dL (ref 13.0–17.0)
LYMPHS ABS: 1.7 10*3/uL (ref 0.7–4.0)
Lymphocytes Relative: 30 %
MCH: 32 pg (ref 26.0–34.0)
MCHC: 34.6 g/dL (ref 30.0–36.0)
MCV: 92.5 fL (ref 78.0–100.0)
MONO ABS: 0.6 10*3/uL (ref 0.1–1.0)
MONOS PCT: 11 %
Neutro Abs: 3.2 10*3/uL (ref 1.7–7.7)
Neutrophils Relative %: 56 %
Platelets: 213 10*3/uL (ref 150–400)
RBC: 5.32 MIL/uL (ref 4.22–5.81)
RDW: 13.7 % (ref 11.5–15.5)
WBC: 5.7 10*3/uL (ref 4.0–10.5)

## 2016-01-01 LAB — BASIC METABOLIC PANEL
Anion gap: 17 — ABNORMAL HIGH (ref 5–15)
BUN: 21 mg/dL — AB (ref 6–20)
CHLORIDE: 96 mmol/L — AB (ref 101–111)
CO2: 23 mmol/L (ref 22–32)
Calcium: 9.6 mg/dL (ref 8.9–10.3)
Creatinine, Ser: 1.51 mg/dL — ABNORMAL HIGH (ref 0.61–1.24)
GFR calc Af Amer: 55 mL/min — ABNORMAL LOW (ref >60)
GFR calc non Af Amer: 47 mL/min — ABNORMAL LOW (ref >60)
GLUCOSE: 360 mg/dL — AB (ref 65–99)
POTASSIUM: 3.9 mmol/L (ref 3.5–5.1)
Sodium: 136 mmol/L (ref 135–145)

## 2016-01-01 LAB — CBG MONITORING, ED
Glucose-Capillary: 345 mg/dL — ABNORMAL HIGH (ref 65–99)
Glucose-Capillary: 270 mg/dL — ABNORMAL HIGH (ref 65–99)

## 2016-01-01 LAB — URINE MICROSCOPIC-ADD ON

## 2016-01-01 LAB — URINALYSIS, ROUTINE W REFLEX MICROSCOPIC
Glucose, UA: 500 mg/dL — AB
HGB URINE DIPSTICK: NEGATIVE
Ketones, ur: 15 mg/dL — AB
Leukocytes, UA: NEGATIVE
Nitrite: NEGATIVE
PH: 5.5 (ref 5.0–8.0)
Protein, ur: 100 mg/dL — AB

## 2016-01-01 LAB — TROPONIN I

## 2016-01-01 MED ORDER — SODIUM CHLORIDE 0.9 % IV BOLUS (SEPSIS)
1000.0000 mL | Freq: Once | INTRAVENOUS | Status: AC
  Administered 2016-01-01: 1000 mL via INTRAVENOUS

## 2016-01-01 MED ORDER — INSULIN ASPART 100 UNIT/ML IV SOLN
10.0000 [IU] | Freq: Once | INTRAVENOUS | Status: AC
  Administered 2016-01-01: 10 [IU] via INTRAVENOUS

## 2016-01-01 NOTE — ED Provider Notes (Signed)
CSN: [id number]     Arrival date & time 01/01/16  1630 History   First MD Initiated Contact with Patient 01/01/16 1812     Chief Complaint  Patient presents with  . Hyperglycemia  PT SAID THAT HE HAS BEEN DRINKING A LOT AND URINATING A LOT, SO HE SAW HIS PCP.  HIS PCP STARTED HIM ON MULTIPLE MEDS ON 5/4.  PT STILL FEELS LIKE HIS SUGAR IS HIGH SO HE CAME INTO THE ED.  PT FEELS WEAK, HAS A DRY MOUTH, AND HAS DECREASED TASTE.   (Consider location/radiation/quality/duration/timing/severity/associated sxs/prior Treatment) Patient is a 65 y.o. male presenting with hyperglycemia. The history is provided by the patient.  Hyperglycemia Severity:  Moderate Onset quality:  Gradual Timing:  Constant Progression:  Unchanged Chronicity:  Chronic Context: new diabetes diagnosis   Associated symptoms: increased thirst, polyuria and weakness     Past Medical History  Diagnosis Date  . Hypertension   . Asthma   . 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 Venous insufficiency 04/29/2011  . Obesity, morbid (more than 100 lbs over ideal weight or BMI > 40) ([facility])   . Edema   . Heart failure, diastolic, chronic ([place])     patient denies  . Hyperlipidemia, mixed   . Diabetes mellitus without complication [facility])    Past Surgical History  Procedure Laterality Date  . Multiple tooth extractions  04/2005  . Back surgery  2006    T6 vertebrae removed/titanium placed  . Lung biopsy  6/06  . Colonoscopy  11/24/2004    Polyps in the left colon ablated/removed as described above.  Two submucosal lesions consistent with lipomas as described above not  manipulated./ Normal rectum  . Port a cath placement    . Colonoscopy  06/20/2012    MULTIPLE RECTAL AND COLONIC POLYPS  . Limbal stem cell transplant    . Port-a-cath removal  09/08/2012    Procedure: REMOVAL PORT-A-CATH;  Surgeon: Melrose Nakayama, MD;  Location: [facility];  Service: Thoracic;  Laterality: N/A;  .  Colonoscopy N/A 03/08/2015    RMR: Capacious, redundant colon. Multiple colonic and rectosigmoid polyps removed. ablated as described above. colonic lipoma abnormal appearing terminal ileum likely a variant of normal). Howeverwith history  biopsies obtained.   . Esophagogastroduodenoscopy N/A 03/08/2015    RMR: Hiatal hernia Polypoid gastric mucosa with multiple gastric polyps. largest polyp removed via snare polypectomgy hemostasis clip placed at base. Status post gastric biopsy. Status post video capsule placement.   Freda Munro capsule study N/A 03/08/2015    Procedure: GIVENS CAPSULE STUDY;  Surgeon: Daneil Dolin, MD;  [facility];  Service: Endoscopy;  Laterality: N/A;   Family History  Problem Relation Age of Onset  . Cancer Mother     lung  . Cancer Father     prostate  . Colon cancer Neg Hx    Social History  Substance Use Topics  . Smoking status: Former Smoker -- 0.50 packs/day for 15 years    Quit date: 11/11/2002  . Smokeless tobacco: Never Used  . Alcohol Use: No    Review of Systems  Endocrine: Positive for polydipsia and polyuria.  Neurological: Positive for weakness.  All other systems reviewed and are negative.     Allergies  Penicillins  Home Medications   Prior to Admission medications   Medication Sig Start Date End Date Taking? Authorizing Provider  baclofen (LIORESAL) 10 MG tablet Take 10 mg by  mouth 3 (three) times daily as needed. For hand cramps    Historical Provider, MD  hydrochlorothiazide (HYDRODIURIL) 25 MG tablet Take 25 mg by mouth daily. 01/29/15   Historical Provider, MD  ibuprofen (ADVIL,MOTRIN) 200 MG tablet Take 200 mg by mouth every 6 (six) hours as needed. Use seldom. For pain    Historical Provider, MD  potassium chloride (K-DUR) 10 MEQ tablet Take 40 meq TODAY 7/15, then take 10 meq daily 03/08/15   Herminio Commons, MD   BP 127/66 mmHg  Pulse 93  Temp(Src) 98 F (36.7 C) (Oral)  Resp 18  Ht 6' (1.829 m)  Wt 257 lb  (116.574 kg)  BMI 34.85 kg/m2  SpO2 100% Physical Exam  Constitutional: He is oriented to person, place, and time. He appears well-developed and well-nourished.  HENT:  Head: Normocephalic and atraumatic.  Right Ear: External ear normal.  Left Ear: External ear normal.  Mouth/Throat: Mucous membranes are dry.  Cardiovascular: Regular rhythm, normal heart sounds and intact distal pulses.  Tachycardia present.   Pulmonary/Chest: Effort normal and breath sounds normal.  Abdominal: Soft. Bowel sounds are normal.  Musculoskeletal: Normal range of motion.  Neurological: He is alert and oriented to person, place, and time.  Skin: Skin is warm and dry.  Psychiatric: He has a normal mood and affect. His behavior is normal. Judgment and thought content normal.  Nursing note and vitals reviewed.   ED Course  Procedures (including critical care time) Labs Review Labs Reviewed  BASIC METABOLIC PANEL - Abnormal; Notable for the following:    Chloride 96 (*)    Glucose, Bld 360 (*)    BUN 21 (*)    Creatinine, Ser 1.51 (*)    GFR calc non Af Amer 47 (*)    GFR calc Af Amer 55 (*)    Anion gap 17 (*)    All other components within normal limits  URINALYSIS, ROUTINE W REFLEX MICROSCOPIC (NOT AT [facility]) - Abnormal; Notable for the following:    Specific Gravity, Urine >1.030 (*)    Glucose, UA 500 (*)    Bilirubin Urine MODERATE (*)    Ketones, ur 15 (*)    Protein, ur 100 (*)    All other components within normal limits  URINE MICROSCOPIC-ADD ON - Abnormal; Notable for the following:    Squamous Epithelial / LPF 0-5 (*)    Bacteria, UA FEW (*)    Casts HYALINE CASTS (*)    All other components within normal limits  CBG MONITORING, ED - Abnormal; Notable for the following:    Glucose-Capillary 345 (*)    All other components within normal limits  CBG MONITORING, ED - Abnormal; Notable for the following:    Glucose-Capillary 270 (*)    All other components within normal limits  CBC WITH  DIFFERENTIAL/PLATELET  TROPONIN I    Imaging Review No results found. I have personally reviewed and evaluated these images and lab results as part of my medical decision-making.   EKG Interpretation   Date/Time:  Wednesday Jan 01 2016 16:39:02 EDT Ventricular Rate:  119 PR Interval:  174 QRS Duration: 104 QT Interval:  312 QTC Calculation: 438 R Axis:   115 Text Interpretation:  Sinus tachycardia Incomplete right bundle branch  block Left posterior fascicular block T wave abnormality, consider  inferior ischemia Abnormal ECG INFERIOR T WAVE CHANGES ARE NEW Confirmed  by Emrys Mckamie MD, Ailanie Ruttan ([id number]) on 01/01/2016 6:21:48 PM      MDM  PT LOOKS  AND FEELS MUCH BETTER.  I SPOKE WITH HIM ABOUT THE CORRECT FOODS TO EAT.  HE KNOWS TO RETURN IF WORSE. Final diagnoses:  Poorly controlled type 2 diabetes mellitus ([place])        Isla Pence, MD 01/01/16 2012

## 2016-01-01 NOTE — ED Notes (Signed)
Pt reports saw his pcp a few days ago and was told his blood sugar was elevated.  Pt was given flimerpiride, lisinopril, metformin, pioglitazone, simvastatin,  COQ-10, and HCTZ to start on 5/4.  Pt says here today because of generalized weakness, dry mouth, decreased appetite and decreased taste.

## 2016-01-23 DIAGNOSIS — Z1389 Encounter for screening for other disorder: Secondary | ICD-10-CM | POA: Diagnosis not present

## 2016-01-23 DIAGNOSIS — E1129 Type 2 diabetes mellitus with other diabetic kidney complication: Secondary | ICD-10-CM | POA: Diagnosis not present

## 2016-01-23 DIAGNOSIS — Z6838 Body mass index (BMI) 38.0-38.9, adult: Secondary | ICD-10-CM | POA: Diagnosis not present

## 2016-01-30 ENCOUNTER — Encounter (HOSPITAL_COMMUNITY): Payer: Medicare Other | Attending: Hematology & Oncology

## 2016-01-30 DIAGNOSIS — D649 Anemia, unspecified: Secondary | ICD-10-CM | POA: Insufficient documentation

## 2016-01-30 DIAGNOSIS — C8330 Diffuse large B-cell lymphoma, unspecified site: Secondary | ICD-10-CM | POA: Insufficient documentation

## 2016-01-30 LAB — CBC WITH DIFFERENTIAL/PLATELET
Basophils Absolute: 0 10*3/uL (ref 0.0–0.1)
Basophils Relative: 1 %
EOS ABS: 0.1 10*3/uL (ref 0.0–0.7)
EOS PCT: 3 %
HCT: 38.5 % — ABNORMAL LOW (ref 39.0–52.0)
Hemoglobin: 13 g/dL (ref 13.0–17.0)
LYMPHS ABS: 1.1 10*3/uL (ref 0.7–4.0)
LYMPHS PCT: 27 %
MCH: 32.1 pg (ref 26.0–34.0)
MCHC: 33.8 g/dL (ref 30.0–36.0)
MCV: 95.1 fL (ref 78.0–100.0)
MONO ABS: 0.5 10*3/uL (ref 0.1–1.0)
Monocytes Relative: 12 %
Neutro Abs: 2.2 10*3/uL (ref 1.7–7.7)
Neutrophils Relative %: 57 %
PLATELETS: 203 10*3/uL (ref 150–400)
RBC: 4.05 MIL/uL — ABNORMAL LOW (ref 4.22–5.81)
RDW: 14.7 % (ref 11.5–15.5)
WBC: 3.9 10*3/uL — ABNORMAL LOW (ref 4.0–10.5)

## 2016-01-30 LAB — FERRITIN: Ferritin: 173 ng/mL (ref 24–336)

## 2016-02-28 ENCOUNTER — Encounter (HOSPITAL_COMMUNITY): Payer: Medicare Other | Attending: Hematology & Oncology

## 2016-02-28 ENCOUNTER — Other Ambulatory Visit (HOSPITAL_COMMUNITY): Payer: Self-pay | Admitting: Hematology & Oncology

## 2016-02-28 DIAGNOSIS — D649 Anemia, unspecified: Secondary | ICD-10-CM | POA: Insufficient documentation

## 2016-02-28 DIAGNOSIS — C8330 Diffuse large B-cell lymphoma, unspecified site: Secondary | ICD-10-CM | POA: Diagnosis not present

## 2016-02-28 LAB — CBC WITH DIFFERENTIAL/PLATELET
BASOS PCT: 1 %
Basophils Absolute: 0 10*3/uL (ref 0.0–0.1)
EOS ABS: 0.2 10*3/uL (ref 0.0–0.7)
EOS PCT: 5 %
HCT: 38.6 % — ABNORMAL LOW (ref 39.0–52.0)
HEMOGLOBIN: 12.8 g/dL — AB (ref 13.0–17.0)
Lymphocytes Relative: 28 %
Lymphs Abs: 1 10*3/uL (ref 0.7–4.0)
MCH: 32.3 pg (ref 26.0–34.0)
MCHC: 33.2 g/dL (ref 30.0–36.0)
MCV: 97.5 fL (ref 78.0–100.0)
MONOS PCT: 15 %
Monocytes Absolute: 0.5 10*3/uL (ref 0.1–1.0)
NEUTROS PCT: 51 %
Neutro Abs: 1.7 10*3/uL (ref 1.7–7.7)
PLATELETS: 149 10*3/uL — AB (ref 150–400)
RBC: 3.96 MIL/uL — ABNORMAL LOW (ref 4.22–5.81)
RDW: 15.1 % (ref 11.5–15.5)
WBC: 3.4 10*3/uL — ABNORMAL LOW (ref 4.0–10.5)

## 2016-02-28 LAB — FERRITIN: FERRITIN: 69 ng/mL (ref 24–336)

## 2016-03-10 ENCOUNTER — Ambulatory Visit (HOSPITAL_COMMUNITY): Payer: Medicare Other

## 2016-03-16 ENCOUNTER — Encounter (HOSPITAL_BASED_OUTPATIENT_CLINIC_OR_DEPARTMENT_OTHER): Payer: Medicare Other

## 2016-03-16 VITALS — BP 117/55 | HR 67 | Temp 97.9°F | Resp 18

## 2016-03-16 DIAGNOSIS — D500 Iron deficiency anemia secondary to blood loss (chronic): Secondary | ICD-10-CM

## 2016-03-16 DIAGNOSIS — D509 Iron deficiency anemia, unspecified: Secondary | ICD-10-CM

## 2016-03-16 DIAGNOSIS — Q2730 Arteriovenous malformation, site unspecified: Secondary | ICD-10-CM | POA: Diagnosis present

## 2016-03-16 MED ORDER — SODIUM CHLORIDE 0.9 % IV SOLN
510.0000 mg | Freq: Once | INTRAVENOUS | Status: AC
  Administered 2016-03-16: 510 mg via INTRAVENOUS
  Filled 2016-03-16: qty 17

## 2016-03-16 MED ORDER — SODIUM CHLORIDE 0.9 % IV SOLN
INTRAVENOUS | Status: DC
  Administered 2016-03-16: 15:00:00 via INTRAVENOUS

## 2016-03-16 NOTE — Progress Notes (Signed)
Patient tolerated infusion well.  VSS.   

## 2016-03-16 NOTE — Patient Instructions (Signed)
[facility] at [facility] Discharge Instructions  RECOMMENDATIONS MADE BY THE CONSULTANT AND ANY TEST RESULTS WILL BE SENT TO YOUR REFERRING PHYSICIAN.  IV iron today.    Thank you for choosing [facility] at [facility] to provide your oncology and hematology care.  To afford each patient quality time with our provider, please arrive at least 15 minutes before your scheduled appointment time.   Beginning January 23rd 2017 lab work for the Cancer Center will be done in the  Main lab at [place] on 1st floor. If you have a lab appointment with the Cancer Center please come in thru the  Main Entrance and check in at the main information desk  You need to re-schedule your appointment should you arrive 10 or more minutes late.  We strive to give you quality time with our providers, and arriving late affects you and other patients whose appointments are after yours.  Also, if you no show three or more times for appointments you may be dismissed from the clinic at the providers discretion.     Again, thank you for choosing [facility].  Our hope is that these requests will decrease the amount of time that you wait before being seen by our physicians.       _____________________________________________________________  Should you have questions after your visit to [facility], please contact our office at [phone] between the hours of 8:30 a.m. and 4:30 p.m.  Voicemails left after 4:30 p.m. will not be returned until the following business day.  For prescription refill requests, have your pharmacy contact our office.         Resources For Cancer Patients and their Caregivers ? American Cancer Society: Can assist with transportation, wigs, general needs, runs Look Good Feel Better.        1-888-227-6333 ? Cancer Care: Provides financial assistance, online support groups, medication/co-pay assistance.  1-800-813-HOPE  (4673) ? [facility] Assists [place] cancer patients and their families through emotional , educational and financial support.  336-427-4357 ? [place] DSS Where to apply for food stamps, Medicaid and utility assistance. 336-342-1394 ? RCATS: Transportation to medical appointments. 336-347-2287 ? Social Security Administration: May apply for disability if have a Stage IV cancer. 336-342-7796 1-800-772-1213 ? [place] Aging, Disability and Transit Services: Assists with nutrition, care and transit needs. 336-349-2343  Cancer Center Support Programs: @10RELATIVEDAYS@ > Cancer Support Group  2nd Tuesday of the month 1pm-2pm, Journey Room  > Creative Journey  3rd Tuesday of the month 1130am-1pm, Journey Room  > Look Good Feel Better  1st Wednesday of the month 10am-12 noon, Journey Room (Call [employer] to register 1-800-395-5775)    

## 2016-03-30 ENCOUNTER — Encounter (HOSPITAL_COMMUNITY): Payer: Medicare Other | Attending: Oncology | Admitting: Oncology

## 2016-03-30 ENCOUNTER — Encounter (HOSPITAL_COMMUNITY): Payer: Medicare Other

## 2016-03-30 ENCOUNTER — Encounter (HOSPITAL_COMMUNITY): Payer: Self-pay | Admitting: Oncology

## 2016-03-30 DIAGNOSIS — D649 Anemia, unspecified: Secondary | ICD-10-CM | POA: Diagnosis not present

## 2016-03-30 DIAGNOSIS — C8330 Diffuse large B-cell lymphoma, unspecified site: Secondary | ICD-10-CM | POA: Insufficient documentation

## 2016-03-30 DIAGNOSIS — D509 Iron deficiency anemia, unspecified: Secondary | ICD-10-CM

## 2016-03-30 DIAGNOSIS — Z8572 Personal history of non-Hodgkin lymphomas: Secondary | ICD-10-CM

## 2016-03-30 LAB — CBC WITH DIFFERENTIAL/PLATELET
BASOS ABS: 0 10*3/uL (ref 0.0–0.1)
Basophils Relative: 1 %
Eosinophils Absolute: 0.2 10*3/uL (ref 0.0–0.7)
Eosinophils Relative: 5 %
HCT: 40.7 % (ref 39.0–52.0)
HEMOGLOBIN: 13.7 g/dL (ref 13.0–17.0)
LYMPHS ABS: 0.8 10*3/uL (ref 0.7–4.0)
LYMPHS PCT: 26 %
MCH: 32.7 pg (ref 26.0–34.0)
MCHC: 33.7 g/dL (ref 30.0–36.0)
MCV: 97.1 fL (ref 78.0–100.0)
Monocytes Absolute: 0.5 10*3/uL (ref 0.1–1.0)
Monocytes Relative: 17 %
NEUTROS PCT: 51 %
Neutro Abs: 1.6 10*3/uL — ABNORMAL LOW (ref 1.7–7.7)
Platelets: 145 10*3/uL — ABNORMAL LOW (ref 150–400)
RBC: 4.19 MIL/uL — AB (ref 4.22–5.81)
RDW: 15 % (ref 11.5–15.5)
WBC: 3.1 10*3/uL — AB (ref 4.0–10.5)

## 2016-03-30 LAB — FERRITIN: FERRITIN: 176 ng/mL (ref 24–336)

## 2016-03-30 NOTE — Patient Instructions (Signed)
[facility] at [facility] Discharge Instructions  RECOMMENDATIONS MADE BY THE CONSULTANT AND ANY TEST RESULTS WILL BE SENT TO YOUR REFERRING PHYSICIAN.  You were seen by Gershon Mussel today. Lab work monthly. Return to clinic in 6 months for follow-up visit. Referral placed to Survivorship. Call clinic with any questions or concerns.   Thank you for choosing [place] at [facility] to provide your oncology and hematology care.  To afford each patient quality time with our provider, please arrive at least 15 minutes before your scheduled appointment time.   Beginning January 23rd 2017 lab work for the [facility] will be done in the  Main lab at [facility] on 1st floor. If you have a lab appointment with the [facility] please come in thru the  Main Entrance and check in at the main information desk  You need to re-schedule your appointment should you arrive 10 or more minutes late.  We strive to give you quality time with our providers, and arriving late affects you and other patients whose appointments are after yours.  Also, if you no show three or more times for appointments you may be dismissed from the clinic at the providers discretion.     Again, thank you for choosing [facility].  Our hope is that these requests will decrease the amount of time that you wait before being seen by our physicians.       _____________________________________________________________  Should you have questions after your visit to [facility], please contact our office at [phone] between the hours of 8:30 a.m. and 4:30 p.m.  Voicemails left after 4:30 p.m. will not be returned until the following business day.  For prescription refill requests, have your pharmacy contact our office.         Resources For Cancer Patients and their Caregivers ? American Cancer Society: Can assist with transportation, wigs, general needs, runs Look  Good Feel Better.        (501)381-6187 ? Cancer Care: Provides financial assistance, online support groups, medication/co-pay assistance.  1-800-813-HOPE 480-230-5118) ? [facility] Assists [place] cancer patients and their families through emotional , educational and financial support.  (504)724-1263 ? [place] DSS Where to apply for food stamps, Medicaid and utility assistance. 321-463-8930 ? RCATS: Transportation to medical appointments. (706) 353-7538 ? Social Security Administration: May apply for disability if have a Stage IV cancer. 682-842-9268 (225)760-5410 ? [employer], Disability and Transit Services: Assists with nutrition, care and transit needs. [place] Support Programs: '@10RELATIVEDAYS'$ @ > Cancer Support Group  2nd Tuesday of the month 1pm-2pm, Journey Room  > Creative Journey  3rd Tuesday of the month 1130am-1pm, Journey Room  > Look Good Feel Better  1st Wednesday of the month 10am-12 noon, Journey Room (Call [place] to register [phone])

## 2016-03-30 NOTE — Assessment & Plan Note (Addendum)
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 He had surgical stabilization of his thoracic spine followed by chemotherapy with R. CHOP. He went on to have ICE chemotherapy pre-transplant followed by total body irradiation followed by stem cell transplant on 01/15/2006. This was an autologous stem cell transplant. He received Rituxan for 2 years. He did not get a complete remission with the R. CHOP chemotherapy initially. His last PET scan was in November 2011.  Oncology history is up to date.  Labs today: CBC diff, ferritin.  I personally reviewed and went over laboratory results with the patient.  The results are noted within this dictation.  Labs in 6 months: CBC diff, CMET, LDH.  Will refer to survivorship program.  Return in 6 months for follow-up.

## 2016-03-30 NOTE — Assessment & Plan Note (Signed)
Iron deficiency anemia with Colonoscopy and EGD on 03/08/2015 and Pillcam 03/08/2015 with probable AVMs.  Requriing intermittent IV iron infusion.  Oncology Flowsheet 03/16/2016  ferumoxytol [facility]) IV 510 mg   Labs today: CBC diff, ferritin.  I personally reviewed and went over laboratory results with the patient.  The results are noted within this dictation.  Labs monthly: CBC diff, ferritin.  Return in 6 months for follow-up.

## 2016-03-30 NOTE — Progress Notes (Signed)
[name]., MD [place] [address]  DLBCL (diffuse large B cell lymphoma) (HCC) - Plan: Comprehensive metabolic panel, Lactate dehydrogenase  IDA (iron deficiency anemia)  CURRENT THERAPY: IV iron when indicated.  Surveillance per NCCN guidelines  INTERVAL HISTORY: [name] 65 y.o. male returns for followup of Iron deficiency anemia with Colonoscopy and EGD on 03/08/2015 and Pillcam 03/08/2015 with probable AVMs.  Requriing intermittent IV iron infusion. AND Diffuse large B-cell lymphoma presenting with extensive stage IV a disease including thoracic vertebral body involvement with biopsy on 02/04/2005. He had surgical stabilization of his thoracic spine followed by chemotherapy with R. CHOP. He went on to have ICE chemotherapy pre-transplant followed by total body irradiation followed by stem cell transplant on 01/15/2006. This was an autologous stem cell transplant. He received Rituxan for 2 years. He did not get a complete remission with the R. CHOP chemotherapy initially. His last PET scan was in November 2011.  Oncology History   S/P chemotherapy following stabilization of the spine.  Initial biopsy was on 07/13/2005 on his bone marrow.  Thoracic vertebra was biopsied on 02/04/2005 and mediastinum on 02/03/2005.  S/P R-CHOP and achieved an incomplete response, then underwent ICE chemotherapy pre-transplant and total body radiation with autologous stem cell transplant on 01/15/2006 and maintained on Rituxan for 2 years.  Thus far without recurrence.     DLBCL (diffuse large B cell lymphoma) ([place])   01/27/2005 Imaging    PET- diffuse activity  involving the L cervical area, extensive activity in the mediastinumn particularly in the R paratracheal area and to a lesser degree in the anterior mediastinum.  Lg area in porta caval nodes in upper abd and diffuse bone activity     02/03/2005 Pathology Results    Mediastinal lymph node demonstrating diffuse  large cell lymphoma, B-type.  Vertebral bone biopsy demonstrating diffuse large b-cell lymphoma     03/11/2005 - 08/04/2005 Chemotherapy    R-CHOP x 8 cycles     05/04/2005 Imaging    PET- near resolution of previously identified uptake.     07/13/2005 Bone Marrow Biopsy    Bone marrow aspiration and biopsy is negative for lymphoma     08/31/2005 Imaging    PET- further response to therapy with findings consistent with residual disease.  Hypermetabolic activity identified within the anterior mediastinal lymph node (smaller but still pathologic).  Borderline hypermetabolic small right pleural effusion     10/20/2005 - 11/13/2005 Chemotherapy    R-ICE x 2 cycles at [facility]     01/08/2006 - 01/09/2006 Chemotherapy    Cyclophosphamide 600 mg daily x 2     01/10/2006 - 01/14/2006 Radiation Therapy    Total body radiation twice daily     01/15/2006 Bone Marrow Transplant    Stem cell transplant at [facility]     04/05/2006 - 12/05/2007 Chemotherapy    Rituxan weekly x 4 every 6 months x 2 years     He is doing very well.  He denies any B symptoms.  He denies any unintentional weight loss.  He denies any blood in his stools.  Review of Systems  Constitutional: Negative.  Negative for chills and fever.  HENT: Negative.   Eyes: Negative.  Negative for blurred vision and double vision.  Respiratory: Negative.  Negative for cough and shortness of breath.   Cardiovascular: Negative.  Negative for chest pain.  Gastrointestinal: Negative.  Negative for abdominal pain, blood in stool, melena, nausea  and vomiting.  Genitourinary: Negative.   Musculoskeletal: Negative.  Negative for falls.  Skin: Negative.  Negative for rash.  Neurological: Negative.  Negative for weakness and headaches.  Endo/Heme/Allergies: Negative.   Psychiatric/Behavioral: Negative.     Past Medical History:  Diagnosis Date  . Asthma   . Diabetes mellitus without complication ([place])   . DLBCL (diffuse large B cell lymphoma) ([place])  02/28/2009  . Edema   . Heart failure, diastolic, chronic ([place])    patient denies  . Hyperlipidemia, mixed   . Hypertension   . IDA (iron deficiency anemia)   . Large cell lymphoma ([place]) 01/2005   autologous stem cell transplant 12/2005  . Obesity, morbid (more than 100 lbs over ideal weight or BMI > 40) (HCC)   . Venous insufficiency 04/29/2011    Past Surgical History:  Procedure Laterality Date  . BACK SURGERY  2006   T6 vertebrae removed/titanium placed  . COLONOSCOPY  11/24/2004   Polyps in the left colon ablated/removed as described above.  Two submucosal lesions consistent with lipomas as described above not  manipulated./ Normal rectum  . COLONOSCOPY  06/20/2012   MULTIPLE RECTAL AND COLONIC POLYPS  . COLONOSCOPY N/A 03/08/2015   RMR: Capacious, redundant colon. Multiple colonic and rectosigmoid polyps removed. ablated as described above. colonic lipoma abnormal appearing terminal ileum likely a variant of normal). Howeverwith history  biopsies obtained.   . ESOPHAGOGASTRODUODENOSCOPY N/A 03/08/2015   RMR: Hiatal hernia Polypoid gastric mucosa with multiple gastric polyps. largest polyp removed via snare polypectomgy hemostasis clip placed at base. Status post gastric biopsy. Status post video capsule placement.   Marland Kitchen GIVENS CAPSULE STUDY N/A 03/08/2015   Procedure: GIVENS CAPSULE STUDY;  Surgeon: Daneil Dolin, MD;  Location: [facility];  Service: Endoscopy;  Laterality: N/A;  . LIMBAL STEM CELL TRANSPLANT    . LUNG BIOPSY  6/06  . MULTIPLE TOOTH EXTRACTIONS  04/2005  . port a cath placement    . PORT-A-CATH REMOVAL  09/08/2012   Procedure: REMOVAL PORT-A-CATH;  Surgeon: Melrose Nakayama, MD;  Location: [facility] OR;  Service: Thoracic;  Laterality: N/A;    Family History  Problem Relation Age of Onset  . Cancer Mother     lung  . Cancer Father     prostate  . Colon cancer Neg Hx     Social History   Social History  . Marital status: Married    Spouse name: N/A  . Number  of children: 2  . Years of education: N/A   Occupational History  . disability due to back Unemployed   Social History Main Topics  . Smoking status: Former Smoker    Packs/day: 0.50    Years: 15.00    Quit date: 11/11/2002  . Smokeless tobacco: Never Used  . Alcohol use No  . Drug use: No  . Sexual activity: Not Asked   Other Topics Concern  . None   Social History Narrative   Married   No regular exercise     PHYSICAL EXAMINATION  ECOG PERFORMANCE STATUS: 0 - Asymptomatic  Vitals:   03/30/16 0846  BP: 119/61  Pulse: 76  Resp: 16  Temp: 98.8 F (37.1 C)    GENERAL:alert, no distress, well nourished, well developed, comfortable, cooperative, obese, smiling and unaccompanied SKIN: skin color, texture, turgor are normal, no rashes or significant lesions HEAD: Normocephalic, No masses, lesions, tenderness or abnormalities EYES: normal, EOMI, Conjunctiva are pink and non-injected EARS: External ears normal OROPHARYNX:lips, buccal mucosa,  and tongue normal and mucous membranes are moist  NECK: supple, no adenopathy, thyroid normal size, non-tender, without nodularity, trachea midline LYMPH:  no palpable lymphadenopathy, no hepatosplenomegaly BREAST:not examined LUNGS: clear to auscultation  HEART: regular rate & rhythm ABDOMEN:abdomen soft, non-tender, obese and normal bowel sounds BACK: Back symmetric, no curvature. EXTREMITIES:less then 2 second capillary refill, no joint deformities, effusion, or inflammation, no skin discoloration, no cyanosis  NEURO: alert & oriented x 3 with fluent speech, no focal motor/sensory deficits, gait normal   LABORATORY DATA: CBC    Component Value Date/Time   WBC 3.1 (L) 03/30/2016 0813   RBC 4.19 (L) 03/30/2016 0813   HGB 13.7 03/30/2016 0813   HCT 40.7 03/30/2016 0813   PLT 145 (L) 03/30/2016 0813   MCV 97.1 03/30/2016 0813   MCH 32.7 03/30/2016 0813   MCHC 33.7 03/30/2016 0813   RDW 15.0 03/30/2016 0813   LYMPHSABS 0.8  03/30/2016 0813   MONOABS 0.5 03/30/2016 0813   EOSABS 0.2 03/30/2016 0813   BASOSABS 0.0 03/30/2016 0813      Chemistry      Component Value Date/Time   NA 136 01/01/2016 1648   K 3.9 01/01/2016 1648   CL 96 (L) 01/01/2016 1648   CO2 23 01/01/2016 1648   BUN 21 (H) 01/01/2016 1648   CREATININE 1.51 (H) 01/01/2016 1648      Component Value Date/Time   CALCIUM 9.6 01/01/2016 1648   ALKPHOS 64 04/16/2015 1318   AST 29 04/16/2015 1318   ALT 31 04/16/2015 1318   BILITOT 0.6 04/16/2015 1318        PENDING LABS:   RADIOGRAPHIC STUDIES:  No results found.   PATHOLOGY:    ASSESSMENT AND PLAN:  DLBCL (diffuse large B cell lymphoma) (HCC) Diffuse large B-cell lymphoma presenting with extensive stage IV a disease including thoracic vertebral body involvement with biopsy on 02/04/2005. He had surgical stabilization of his thoracic spine followed by chemotherapy with R. CHOP. He went on to have ICE chemotherapy pre-transplant followed by total body irradiation followed by stem cell transplant on 01/15/2006. This was an autologous stem cell transplant. He received Rituxan for 2 years. He did not get a complete remission with the R. CHOP chemotherapy initially. His last PET scan was in November 2011.  Oncology history is up to date.  Labs today: CBC diff, ferritin.  I personally reviewed and went over laboratory results with the patient.  The results are noted within this dictation.  Labs in 6 months: CBC diff, CMET, LDH.  Will refer to survivorship program.  Return in 6 months for follow-up.  IDA (iron deficiency anemia) Iron deficiency anemia with Colonoscopy and EGD on 03/08/2015 and Pillcam 03/08/2015 with probable AVMs.  Requriing intermittent IV iron infusion.  Oncology Flowsheet 03/16/2016  ferumoxytol [facility]) IV 510 mg   Labs today: CBC diff, ferritin.  I personally reviewed and went over laboratory results with the patient.  The results are noted within this  dictation.  Labs monthly: CBC diff, ferritin.  Return in 6 months for follow-up.   ORDERS PLACED FOR THIS ENCOUNTER: Orders Placed This Encounter  Procedures  . Comprehensive metabolic panel  . Lactate dehydrogenase    MEDICATIONS PRESCRIBED THIS ENCOUNTER: Meds ordered this encounter  Medications  . glimepiride (AMARYL) 1 MG tablet    Refill:  0  . BAYER CONTOUR NEXT TEST test strip    Sig: TEST FOUR TIMES A DAY AND AS NEEDED DEPENDING ON SYMPTOMS    Refill:  0  . BAYER MICROLET LANCETS lancets    Sig: daily. for testing    Refill:  0  . lisinopril (PRINIVIL,ZESTRIL) 2.5 MG tablet  . metFORMIN (GLUCOPHAGE) 1000 MG tablet    Sig: Take 1,000 mg by mouth 2 (two) times daily.    Refill:  0  . pioglitazone (ACTOS) 30 MG tablet    Refill:  0  . simvastatin (ZOCOR) 20 MG tablet    THERAPY PLAN:  We will continue to monitor iron studies and provide IV iron when indicated.  NCCN guidelines recommends the follow surveillance for Stage I, II for those who asertain a complete response in the first-line treatment setting (3.2017):  A. H&P every 3-6 months for 5 years, then yearly or as clinically indicated.  B. Labs every 3-6 months for 5 years and then annually or as clinically indicated.  C. Imaging only as indicated. For those ascertaining a complete response in the Stage III, IV setting in first-line treatment setting:  A. H&P every 3-6 months for 5 years, then yearly or as clinically indicated.  B. Labs every 3-6 months for 5 years and then annually or as clinically indicated.  C. CT C/A/P with contrast no more than every 6 months for 2 years after  completion of treatment; then only as indicated.   All questions were answered. The patient knows to call the clinic with any problems, questions or concerns. We can certainly see the patient much sooner if necessary.  Patient and plan discussed with Dr. Ancil Linsey and she is in agreement with the aforementioned.   This  note is electronically signed by: Doy Mince 03/30/2016 8:59 AM

## 2016-04-30 ENCOUNTER — Encounter (HOSPITAL_COMMUNITY): Payer: Medicare Other | Attending: Hematology & Oncology

## 2016-04-30 DIAGNOSIS — C8330 Diffuse large B-cell lymphoma, unspecified site: Secondary | ICD-10-CM | POA: Diagnosis not present

## 2016-04-30 LAB — FERRITIN: FERRITIN: 105 ng/mL (ref 24–336)

## 2016-04-30 LAB — CBC WITH DIFFERENTIAL/PLATELET
BASOS PCT: 1 %
Basophils Absolute: 0 10*3/uL (ref 0.0–0.1)
EOS ABS: 0.2 10*3/uL (ref 0.0–0.7)
EOS PCT: 4 %
HCT: 43.2 % (ref 39.0–52.0)
Hemoglobin: 15 g/dL (ref 13.0–17.0)
LYMPHS ABS: 1.1 10*3/uL (ref 0.7–4.0)
Lymphocytes Relative: 24 %
MCH: 33.3 pg (ref 26.0–34.0)
MCHC: 34.7 g/dL (ref 30.0–36.0)
MCV: 95.8 fL (ref 78.0–100.0)
MONO ABS: 0.7 10*3/uL (ref 0.1–1.0)
MONOS PCT: 16 %
Neutro Abs: 2.5 10*3/uL (ref 1.7–7.7)
Neutrophils Relative %: 55 %
PLATELETS: 169 10*3/uL (ref 150–400)
RBC: 4.51 MIL/uL (ref 4.22–5.81)
RDW: 14.1 % (ref 11.5–15.5)
WBC: 4.5 10*3/uL (ref 4.0–10.5)

## 2016-05-12 ENCOUNTER — Ambulatory Visit (HOSPITAL_COMMUNITY): Payer: Medicare Other | Admitting: Adult Health

## 2016-05-29 ENCOUNTER — Encounter (HOSPITAL_COMMUNITY): Payer: Medicare Other | Attending: Hematology & Oncology

## 2016-05-29 DIAGNOSIS — C8330 Diffuse large B-cell lymphoma, unspecified site: Secondary | ICD-10-CM | POA: Insufficient documentation

## 2016-05-29 LAB — CBC WITH DIFFERENTIAL/PLATELET
BASOS ABS: 0 10*3/uL (ref 0.0–0.1)
BASOS PCT: 1 %
EOS ABS: 0.2 10*3/uL (ref 0.0–0.7)
Eosinophils Relative: 6 %
HEMATOCRIT: 42.6 % (ref 39.0–52.0)
HEMOGLOBIN: 14.6 g/dL (ref 13.0–17.0)
Lymphocytes Relative: 26 %
Lymphs Abs: 0.9 10*3/uL (ref 0.7–4.0)
MCH: 32.7 pg (ref 26.0–34.0)
MCHC: 34.3 g/dL (ref 30.0–36.0)
MCV: 95.5 fL (ref 78.0–100.0)
Monocytes Absolute: 0.3 10*3/uL (ref 0.1–1.0)
Monocytes Relative: 10 %
NEUTROS ABS: 1.9 10*3/uL (ref 1.7–7.7)
NEUTROS PCT: 57 %
Platelets: 155 10*3/uL (ref 150–400)
RBC: 4.46 MIL/uL (ref 4.22–5.81)
RDW: 14.2 % (ref 11.5–15.5)
WBC: 3.3 10*3/uL — AB (ref 4.0–10.5)

## 2016-05-29 LAB — FERRITIN: FERRITIN: 103 ng/mL (ref 24–336)

## 2016-06-09 ENCOUNTER — Encounter (HOSPITAL_BASED_OUTPATIENT_CLINIC_OR_DEPARTMENT_OTHER): Payer: Medicare Other | Admitting: Adult Health

## 2016-06-09 ENCOUNTER — Encounter (HOSPITAL_COMMUNITY): Payer: Self-pay | Admitting: Adult Health

## 2016-06-09 DIAGNOSIS — C8330 Diffuse large B-cell lymphoma, unspecified site: Secondary | ICD-10-CM | POA: Diagnosis not present

## 2016-06-09 DIAGNOSIS — D509 Iron deficiency anemia, unspecified: Secondary | ICD-10-CM

## 2016-06-09 DIAGNOSIS — R413 Other amnesia: Secondary | ICD-10-CM | POA: Diagnosis not present

## 2016-06-09 NOTE — Progress Notes (Signed)
[place], [place] [address]   CLINIC:  Survivorship  REASON FOR VISIT:  Long-term survivorship visit for h/o DLBCL s/p stem cell transplant.   BRIEF ONCOLOGY HISTORY:  Oncology History   S/P chemotherapy following stabilization of the spine.  Initial biopsy was on 07/13/2005 on his bone marrow.  Thoracic vertebra was biopsied on 02/04/2005 and mediastinum on 02/03/2005.  S/P R-CHOP and achieved an incomplete response, then underwent ICE chemotherapy pre-transplant and total body radiation with autologous stem cell transplant on 01/15/2006 and maintained on Rituxan for 2 years.  Thus far without recurrence.     DLBCL (diffuse large B cell lymphoma) ([facility] [facility])   01/27/2005 Imaging    PET- diffuse activity  involving the L cervical area, extensive activity in the mediastinumn particularly in the R paratracheal area and to a lesser degree in the anterior mediastinum.  Lg area in porta caval nodes in upper abd and diffuse bone activity      02/03/2005 Pathology Results    Mediastinal lymph node demonstrating diffuse large cell lymphoma, B-type.  Vertebral bone biopsy demonstrating diffuse large b-cell lymphoma      03/11/2005 - 08/04/2005 Chemotherapy    R-CHOP x 8 cycles      05/04/2005 Imaging    PET- near resolution of previously identified uptake.      07/13/2005 Bone Marrow Biopsy    Bone marrow aspiration and biopsy is negative for lymphoma      08/31/2005 Imaging    PET- further response to therapy with findings consistent with residual disease.  Hypermetabolic activity identified within the anterior mediastinal lymph node (smaller but still pathologic).  Borderline hypermetabolic small right pleural effusion      10/20/2005 - 11/13/2005 Chemotherapy    R-ICE x 2 cycles at [facility]      01/08/2006 - 01/09/2006 Chemotherapy    Cyclophosphamide 600 mg daily x 2      01/10/2006 - 01/14/2006 Radiation Therapy    Total body radiation twice daily      01/15/2006 Bone Marrow Transplant    Stem cell transplant at [facility]      04/05/2006 - 12/05/2007 Chemotherapy    Rituxan weekly x 4 every 6 months x 2 years        INTERVAL HISTORY:  Mr. Nazir presents to the survivorship clinic today for our initial meeting. He is now 11 years out from his diffuse large B-cell lymphoma with extensive thoracic vertebral body involvement. He underwent 8 cycles of R-CHOP chemotherapy, which he remained with residual disease. His care was transferred to [facility], where he received additional chemotherapy and total body radiation. He underwent stem cell transplant under the care of Dr. Marcell Anger at [facility] in 12/2005. He went on to complete 2 years of post transplant Rituxan therapy, which he completed in 11/2007.  Since his last visit to the cancer center, he tells me he has been feeling well. He denies any B symptoms. He has not had any unintentional weight loss. He does have some peripheral neuropathy in his bilateral feet, but this is not worsened from previous. He also endorses occasional back pain (r/t h/o thoracic cancer involvement & subsequent placement of titanium plate per patient).  Also reports occasional joint aches/pains, for which he takes ibuprofen as needed. He is trying to make more healthy choices with regards to his diet, particularly in the setting of his diabetes. He takes a diuretic for hypertension. He tells me his energy levels have been  good, "but when I get tired, I'm tired and rest." He doesn't tell acinic exercises regularly, including stretching. He tells me that he walks some for exercise, but is limited due to his peripheral neuropathy and arthritis. He does endorse that he has not been sleeping well, but this does not interfere with his ability to function during the day. He tells me his mental focus is not as sharp as it used to be, he wonders if this is related to chemo brain.  He is also being followed for her iron deficiency anemia,  intermittently requiring IV iron infusions. He last saw Dr. Gordy Levan from gastroenterology in 10/2015.  His wife has been very ill for the past several years, and was recently diagnosed with Sjogren syndrome. She has been hospitalized a few times within the past year, and often has several doctors' appointments. They've been married for 33 years, and it has been hard on him to see her sick. They have 2 children; 1 daughter (age 87), who is in school and working. They also have 1 son (age 35), who is also in school and working. He has one grandson (his daughter's son) who just turned 1 on 9/26.  He feels like his spirits are good overall.  He currently is unable to work; he is on disability. 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 Not being able to work is one of his current stressors. He is definitely interested in learning more about potential financial resources that may be available to he and his family, should he need them in the future.  For fun, he really enjoys spending time with an caring for his family. He really enjoys playing with his 17-year-old grandson.  ADDITIONAL REVIEW OF SYSTEMS:  Review of Systems  Constitutional: Negative for chills and fever.  HENT: Negative.   Eyes: Negative.   Respiratory: Negative.   Cardiovascular: Negative.   Gastrointestinal: Negative.  Negative for blood in stool and melena.  Genitourinary: Negative.   Musculoskeletal: Positive for back pain and joint pain.  Skin: Negative.   Neurological: Negative.   Endo/Heme/Allergies: Negative.   Psychiatric/Behavioral: Positive for memory loss.     PAST MEDICAL & SURGICAL HISTORY:  Past Medical History:  Diagnosis Date  . Asthma   . Diabetes mellitus without complication ([facility])   . DLBCL (diffuse large B cell lymphoma) ([facility]) 02/28/2009  . Edema   . Heart failure, diastolic, chronic ([facility] [facility])    patient denies  . Hyperlipidemia, mixed   . Hypertension   . IDA (iron deficiency anemia)   .  Large cell lymphoma ([facility]) 01/2005   autologous stem cell transplant 12/2005  . Obesity, morbid (more than 100 lbs over ideal weight or BMI > 40) ([facility])   . Venous insufficiency 04/29/2011   Past Surgical History:  Procedure Laterality Date  . BACK SURGERY  2006   T6 vertebrae removed/titanium placed  . COLONOSCOPY  11/24/2004   Polyps in the left colon ablated/removed as described above.  Two submucosal lesions consistent with lipomas as described above not  manipulated./ Normal rectum  . COLONOSCOPY  06/20/2012   MULTIPLE RECTAL AND COLONIC POLYPS  . COLONOSCOPY N/A 03/08/2015   RMR: Capacious, redundant colon. Multiple colonic and rectosigmoid polyps removed. ablated as described above. colonic lipoma abnormal appearing terminal ileum likely a variant of normal). Howeverwith history  biopsies obtained.   . ESOPHAGOGASTRODUODENOSCOPY N/A 03/08/2015   RMR: Hiatal hernia Polypoid gastric mucosa with multiple gastric polyps. largest polyp removed via snare polypectomgy  hemostasis clip placed at base. Status post gastric biopsy. Status post video capsule placement.   Marland Kitchen GIVENS CAPSULE STUDY N/A 03/08/2015   Procedure: GIVENS CAPSULE STUDY;  Surgeon: Daneil Dolin, MD;  Location[facility];  Service: Endoscopy;  Laterality: N/A;  . LIMBAL STEM CELL TRANSPLANT    . LUNG BIOPSY  6/06  . MULTIPLE TOOTH EXTRACTIONS  04/2005  . port a cath placement    . PORT-A-CATH REMOVAL  09/08/2012   Procedure: REMOVAL PORT-A-CATH;  Surgeon: Melrose Nakayama, MD;  Location: [facility];  Service: Thoracic;  Laterality: N/A;     SOCIAL HISTORY:  Mr. Petrich is married to his wife of 62 years. They have 2 children; a daughter age 35 and the son age 88. They have one grandson, who is 1. Mr. Kirksey is currently on disability and unable to work. 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 He denies any current tobacco, alcohol, or illicit drug use.  CURRENT MEDICATIONS:  Current Outpatient  Prescriptions on File Prior to Visit  Medication Sig Dispense Refill  . baclofen (LIORESAL) 10 MG tablet Take 10 mg by mouth 3 (three) times daily as needed. For hand cramps    . BAYER CONTOUR NEXT TEST test strip TEST FOUR TIMES A DAY AND AS NEEDED DEPENDING ON SYMPTOMS  0  . BAYER MICROLET LANCETS lancets daily. for testing  0  . glimepiride (AMARYL) 1 MG tablet   0  . hydrochlorothiazide (HYDRODIURIL) 25 MG tablet Take 25 mg by mouth daily.  0  . ibuprofen (ADVIL,MOTRIN) 200 MG tablet Take 200 mg by mouth every 6 (six) hours as needed. Use seldom. For pain    . lisinopril (PRINIVIL,ZESTRIL) 2.5 MG tablet     . metFORMIN (GLUCOPHAGE) 1000 MG tablet Take 1,000 mg by mouth 2 (two) times daily.  0  . pioglitazone (ACTOS) 30 MG tablet   0  . potassium chloride (K-DUR) 10 MEQ tablet Take 40 meq TODAY 7/15, then take 10 meq daily (Patient not taking: Reported on 03/30/2016) 90 tablet 3  . simvastatin (ZOCOR) 20 MG tablet      No current facility-administered medications on file prior to visit.     ALLERGIES: Allergies  Allergen Reactions  . Penicillins Rash    PHYSICAL EXAM:  General: Male in no acute distress.  Unaccompanied today. HEENT: Head is normocephalic. Conjunctivae clear without exudate.  Sclerae anicteric.   Cardiovascular: Normal rate and rhythm Respiratory: Clear to auscultation bilaterally. Chest expansion symmetric without accessory muscle use. Breathing non-labored.    GU: Deferred.   GI: Soft, non-tender abdomen. Normoactive bowel sounds.  Neuro: No focal deficits. Steady gait.   Psych: Normal mood and affect for situation. Appropriately tearful at times during visit.  Extremities: 1+ BLE edema.    Skin: Warm and dry.   LABORATORY DATA: None for this visit.   DIAGNOSTIC IMAGING:  None for this visit.    ASSESSMENT & PLAN:  Mr. Requena is a pleasant 65 y.o. male with history of Stage IV diffuse large B-cell lymphoma including the thoracic vertebral body, diagnosed  in 01/2005; he underwent thoracic spine surgery for stabilization, followed by R CHOP chemotherapy and ice chemotherapy before transplant. On 01/15/2006, he underwent total body radiation followed by autologous stem cell transplant.  He received a total of 2 years of Rituxan post transplant. He has reportedly been without evidence of disease since that time. Patient presents to survivorship clinic today for long-term survivorship visit and routine cancer surveillance.  1. History of Stage IV DLBCL s/p autologous BMT, now in remission: Clinically, Mr. Pontillo continues to do quite well. He is now over 11 years out from his original diagnosis without evidence of recurrence, which is very favorable. He has occasional back pain, secondary to his thoracic spine disease and subsequent surgery. He has occasional peripheral neuropathy to his feet since completing chemotherapy; this is chronic, and not worsened from previous. Otherwise, he is largely without complaints. He will continue to have labs checked monthly. He will continue follow-up at [facility] in 09/2016 with labs and physical exam.  2. Iron deficiency anemia: Mr. Wager denies any blood in his stools or melena. He will continue follow-up for iron deficiency anemia at the cancer center as previously directed. He will also maintain adequate follow-up with gastroenterology.  3. Memory loss: We discussed that Mr. Macgowan memory loss could certainly be secondary to chemotherapy, but that generally "chemo brain" improves with time. We also discussed that perhaps the current stressors in his life with the illness of his wife, could be contributing to his inability to remember things as well as he could previously. He does endorse occasional sleep disturbance, as he is more comfortable sleeping wall in a seated position, given his back pain. Fatigue and lack of sleep and certainly contributing to memory loss as well. I encouraged him to engage in  exercise, as this may improve but this fatigue and memory concerns. He is encouraged to let us know if symptoms do not improve.  4. Emotional support: It is not uncommon for this period of the patient's cancer care trajectory to be one of many emotions.  One of his current major stressors is the declining health with his wife. I encouraged him to take care of himself, and let us know how we can continue to support him.  Mr. Sharp was encouraged to take advantage of our support services programs and support groups to better cope in his new life as a cancer survivor after completing anti-cancer treatment. Mr. Drees became tearful today during our visit, expressing his gratitude for his cancer team both at [facility] and here at [facility].  I provided support through active listening, validation & normalization of concerns, and expressive supportive counseling. He stated, "I think I have been holding that in for a long time and I feel better for letting it out."  Encouraged him to call me with any questions or concerns.   5. Physical activity/Healthy eating: Getting adequate physical activity and maintaining a healthy diet as a cancer survivor is important for overall wellness and reduces the risk of cancer recurrence. We discussed the [facility], which is a fitness program that is offered to cancer survivors free of charge.  We also reviewed "The Nutrition Rainbow" handout, which provides patients with recommendations for fresh fruits/vegetables and the anti-cancer properties of foods in certain color groups.     6. Health promotion:  Mr. Goeller currently does not use tobacco products or drink alcohol. I commended his efforts to remain both tobacco and alcohol free, as both of the known to increase risk of cancer recurrence.    Dispo:  -Encouraged to call the cancer center with any additional questions or concerns before his next scheduled appointment here. -Return to [facility]  in 09/2016.     A total of 35 minutes was spent in face-to-face care of this patient, with greater than 50% of that time spent in counseling and care  coordination.   Mike Craze, NP Survivorship Program [place] [phone]

## 2016-06-16 ENCOUNTER — Encounter: Payer: Self-pay | Admitting: *Deleted

## 2016-06-16 NOTE — Progress Notes (Signed)
[facility] Clinical Social Work  Clinical Social Work was referred by Engineer, mining for assessment of psychosocial needs due to request for possible community resources. Clinical Social Worker attempted to contact patient at home to offer support and assess for needs.   CSW left brief, supportive message and encouraged pt to return CSW call.    Clinical Social Work interventions: Resource assistance  Loren Racer, [place] Tuesdays   [phone]

## 2016-06-29 ENCOUNTER — Other Ambulatory Visit (HOSPITAL_COMMUNITY): Payer: Medicare Other

## 2016-07-29 ENCOUNTER — Encounter (HOSPITAL_COMMUNITY): Payer: Medicare Other | Attending: Hematology & Oncology

## 2016-07-29 DIAGNOSIS — C8330 Diffuse large B-cell lymphoma, unspecified site: Secondary | ICD-10-CM | POA: Insufficient documentation

## 2016-07-29 LAB — CBC WITH DIFFERENTIAL/PLATELET
BASOS PCT: 0 %
Basophils Absolute: 0 10*3/uL (ref 0.0–0.1)
EOS ABS: 0.3 10*3/uL (ref 0.0–0.7)
EOS PCT: 5 %
HCT: 39.1 % (ref 39.0–52.0)
Hemoglobin: 13.3 g/dL (ref 13.0–17.0)
LYMPHS ABS: 0.9 10*3/uL (ref 0.7–4.0)
Lymphocytes Relative: 18 %
MCH: 32.8 pg (ref 26.0–34.0)
MCHC: 34 g/dL (ref 30.0–36.0)
MCV: 96.5 fL (ref 78.0–100.0)
MONOS PCT: 12 %
Monocytes Absolute: 0.6 10*3/uL (ref 0.1–1.0)
NEUTROS PCT: 65 %
Neutro Abs: 3.3 10*3/uL (ref 1.7–7.7)
Platelets: 170 10*3/uL (ref 150–400)
RBC: 4.05 MIL/uL — AB (ref 4.22–5.81)
RDW: 14.3 % (ref 11.5–15.5)
WBC: 5.1 10*3/uL (ref 4.0–10.5)

## 2016-07-29 LAB — FERRITIN: FERRITIN: 145 ng/mL (ref 24–336)

## 2016-08-28 ENCOUNTER — Encounter (HOSPITAL_COMMUNITY): Payer: Medicare Other | Attending: Hematology & Oncology

## 2016-08-28 DIAGNOSIS — C8330 Diffuse large B-cell lymphoma, unspecified site: Secondary | ICD-10-CM | POA: Diagnosis not present

## 2016-08-28 LAB — FERRITIN: Ferritin: 124 ng/mL (ref 24–336)

## 2016-08-28 LAB — CBC WITH DIFFERENTIAL/PLATELET
BASOS PCT: 1 %
Basophils Absolute: 0 10*3/uL (ref 0.0–0.1)
EOS PCT: 6 %
Eosinophils Absolute: 0.2 10*3/uL (ref 0.0–0.7)
HEMATOCRIT: 41.2 % (ref 39.0–52.0)
Hemoglobin: 13.9 g/dL (ref 13.0–17.0)
Lymphocytes Relative: 27 %
Lymphs Abs: 0.9 10*3/uL (ref 0.7–4.0)
MCH: 33 pg (ref 26.0–34.0)
MCHC: 33.7 g/dL (ref 30.0–36.0)
MCV: 97.9 fL (ref 78.0–100.0)
MONO ABS: 0.4 10*3/uL (ref 0.1–1.0)
MONOS PCT: 12 %
NEUTROS ABS: 1.9 10*3/uL (ref 1.7–7.7)
Neutrophils Relative %: 54 %
Platelets: 140 10*3/uL — ABNORMAL LOW (ref 150–400)
RBC: 4.21 MIL/uL — ABNORMAL LOW (ref 4.22–5.81)
RDW: 14.4 % (ref 11.5–15.5)
WBC: 3.4 10*3/uL — ABNORMAL LOW (ref 4.0–10.5)

## 2016-09-30 ENCOUNTER — Other Ambulatory Visit (HOSPITAL_COMMUNITY): Payer: Medicare Other

## 2016-09-30 ENCOUNTER — Ambulatory Visit (HOSPITAL_COMMUNITY): Payer: Medicare Other | Admitting: Oncology

## 2016-10-01 ENCOUNTER — Encounter: Payer: Self-pay | Admitting: Internal Medicine

## 2016-10-27 ENCOUNTER — Other Ambulatory Visit (HOSPITAL_COMMUNITY): Payer: Medicare Other

## 2016-10-27 ENCOUNTER — Ambulatory Visit (HOSPITAL_COMMUNITY): Payer: Medicare Other | Admitting: Oncology

## 2016-11-03 ENCOUNTER — Other Ambulatory Visit (HOSPITAL_COMMUNITY): Payer: Self-pay | Admitting: *Deleted

## 2016-11-03 NOTE — Assessment & Plan Note (Deleted)
Iron deficiency anemia with Colonoscopy and EGD on 03/08/2015 and Pillcam 03/08/2015 with probable AVMs.  Requring intermittent IV iron infusion.  Oncology Flowsheet 03/16/2016  ferumoxytol [facility]) IV 510 mg   Labs today: CBC diff, CMET, iron/TIBC, ferritin.  I personally reviewed and went over laboratory results with the patient.  The results are noted within this dictation.  Labs in 6 months: CBC diff, CMET, iron/TIBC, ferritin.  Return in 6 months for follow-up.

## 2016-11-03 NOTE — Assessment & Plan Note (Deleted)
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 He had surgical stabilization of his thoracic spine followed by chemotherapy with R. CHOP. He went on to have ICE chemotherapy pre-transplant followed by total body irradiation followed by stem cell transplant on 01/15/2006. This was an autologous stem cell transplant. He received Rituxan for 2 years. He did not get a complete remission with the R. CHOP chemotherapy initially. His last PET scan was in November 2011.  Oncology history is up to date.  Labs today: CBC diff, CMET, LDH.  I personally reviewed and went over laboratory results with the patient.  The results are noted within this dictation.  Labs in 6 months: CBC diff, CMET, LDH.

## 2016-11-03 NOTE — Progress Notes (Signed)
NO SHOW  Review of Systems

## 2016-11-04 ENCOUNTER — Other Ambulatory Visit (HOSPITAL_COMMUNITY): Payer: Medicare Other

## 2016-11-04 ENCOUNTER — Ambulatory Visit (HOSPITAL_COMMUNITY): Payer: Medicare Other | Admitting: Oncology

## 2016-11-17 DIAGNOSIS — Z1389 Encounter for screening for other disorder: Secondary | ICD-10-CM | POA: Diagnosis not present

## 2016-11-17 DIAGNOSIS — Z6841 Body Mass Index (BMI) 40.0 and over, adult: Secondary | ICD-10-CM | POA: Diagnosis not present

## 2016-11-17 DIAGNOSIS — B351 Tinea unguium: Secondary | ICD-10-CM | POA: Diagnosis not present

## 2016-11-17 DIAGNOSIS — L84 Corns and callosities: Secondary | ICD-10-CM | POA: Diagnosis not present

## 2016-11-17 DIAGNOSIS — C8330 Diffuse large B-cell lymphoma, unspecified site: Secondary | ICD-10-CM | POA: Diagnosis not present

## 2016-11-17 DIAGNOSIS — Z0000 Encounter for general adult medical examination without abnormal findings: Secondary | ICD-10-CM | POA: Diagnosis not present

## 2016-11-17 DIAGNOSIS — Z0001 Encounter for general adult medical examination with abnormal findings: Secondary | ICD-10-CM | POA: Diagnosis not present

## 2016-11-17 DIAGNOSIS — E782 Mixed hyperlipidemia: Secondary | ICD-10-CM | POA: Diagnosis not present

## 2016-11-17 DIAGNOSIS — Z23 Encounter for immunization: Secondary | ICD-10-CM | POA: Diagnosis not present

## 2016-11-17 DIAGNOSIS — Z125 Encounter for screening for malignant neoplasm of prostate: Secondary | ICD-10-CM | POA: Diagnosis not present

## 2016-11-17 DIAGNOSIS — E1129 Type 2 diabetes mellitus with other diabetic kidney complication: Secondary | ICD-10-CM | POA: Diagnosis not present

## 2017-01-13 ENCOUNTER — Encounter: Payer: Self-pay | Admitting: Internal Medicine

## 2017-01-20 ENCOUNTER — Other Ambulatory Visit (HOSPITAL_COMMUNITY): Payer: Self-pay | Admitting: *Deleted

## 2017-01-21 ENCOUNTER — Encounter (HOSPITAL_COMMUNITY): Payer: Medicare Other | Attending: Oncology

## 2017-01-21 ENCOUNTER — Other Ambulatory Visit (HOSPITAL_COMMUNITY): Payer: Medicare Other

## 2017-01-22 ENCOUNTER — Encounter (HOSPITAL_COMMUNITY): Payer: Medicare Other | Attending: Oncology

## 2017-01-22 DIAGNOSIS — C8330 Diffuse large B-cell lymphoma, unspecified site: Secondary | ICD-10-CM | POA: Insufficient documentation

## 2017-01-22 DIAGNOSIS — D509 Iron deficiency anemia, unspecified: Secondary | ICD-10-CM | POA: Insufficient documentation

## 2017-01-22 LAB — CBC WITH DIFFERENTIAL/PLATELET
BASOS ABS: 0 10*3/uL (ref 0.0–0.1)
Basophils Relative: 0 %
Eosinophils Absolute: 0.2 10*3/uL (ref 0.0–0.7)
Eosinophils Relative: 3 %
HEMATOCRIT: 39.3 % (ref 39.0–52.0)
HEMOGLOBIN: 13.7 g/dL (ref 13.0–17.0)
LYMPHS PCT: 19 %
Lymphs Abs: 1.5 10*3/uL (ref 0.7–4.0)
MCH: 32.2 pg (ref 26.0–34.0)
MCHC: 34.9 g/dL (ref 30.0–36.0)
MCV: 92.3 fL (ref 78.0–100.0)
Monocytes Absolute: 0.9 10*3/uL (ref 0.1–1.0)
Monocytes Relative: 12 %
NEUTROS PCT: 66 %
Neutro Abs: 5.3 10*3/uL (ref 1.7–7.7)
PLATELETS: 159 10*3/uL (ref 150–400)
RBC: 4.26 MIL/uL (ref 4.22–5.81)
RDW: 14.2 % (ref 11.5–15.5)
WBC: 7.9 10*3/uL (ref 4.0–10.5)

## 2017-01-22 LAB — IRON AND TIBC
Iron: 31 ug/dL — ABNORMAL LOW (ref 45–182)
Saturation Ratios: 10 % — ABNORMAL LOW (ref 17.9–39.5)
TIBC: 311 ug/dL (ref 250–450)
UIBC: 280 ug/dL

## 2017-01-22 LAB — COMPREHENSIVE METABOLIC PANEL
ALT: 20 U/L (ref 17–63)
AST: 19 U/L (ref 15–41)
Albumin: 3.8 g/dL (ref 3.5–5.0)
Alkaline Phosphatase: 64 U/L (ref 38–126)
Anion gap: 11 (ref 5–15)
BILIRUBIN TOTAL: 0.9 mg/dL (ref 0.3–1.2)
BUN: 18 mg/dL (ref 6–20)
CHLORIDE: 100 mmol/L — AB (ref 101–111)
CO2: 25 mmol/L (ref 22–32)
Calcium: 9.2 mg/dL (ref 8.9–10.3)
Creatinine, Ser: 1.35 mg/dL — ABNORMAL HIGH (ref 0.61–1.24)
GFR calc Af Amer: >60 mL/min (ref >60)
GFR, EST NON AFRICAN AMERICAN: 54 mL/min — AB (ref >60)
Glucose, Bld: 103 mg/dL — ABNORMAL HIGH (ref 65–99)
Potassium: 3.4 mmol/L — ABNORMAL LOW (ref 3.5–5.1)
Sodium: 136 mmol/L (ref 135–145)
Total Protein: 7.3 g/dL (ref 6.5–8.1)

## 2017-01-22 LAB — LACTATE DEHYDROGENASE: LDH: 167 U/L (ref 98–192)

## 2017-01-22 LAB — FERRITIN: Ferritin: 147 ng/mL (ref 24–336)

## 2017-02-05 ENCOUNTER — Encounter (HOSPITAL_BASED_OUTPATIENT_CLINIC_OR_DEPARTMENT_OTHER): Payer: Medicare Other | Admitting: Adult Health

## 2017-02-05 ENCOUNTER — Encounter (HOSPITAL_COMMUNITY): Payer: Self-pay | Admitting: Adult Health

## 2017-02-05 VITALS — Ht 71.0 in | Wt 276.0 lb

## 2017-02-05 DIAGNOSIS — Z8579 Personal history of other malignant neoplasms of lymphoid, hematopoietic and related tissues: Secondary | ICD-10-CM | POA: Diagnosis not present

## 2017-02-05 DIAGNOSIS — R05 Cough: Secondary | ICD-10-CM | POA: Diagnosis not present

## 2017-02-05 DIAGNOSIS — C8330 Diffuse large B-cell lymphoma, unspecified site: Secondary | ICD-10-CM

## 2017-02-05 DIAGNOSIS — D509 Iron deficiency anemia, unspecified: Secondary | ICD-10-CM

## 2017-02-05 NOTE — Patient Instructions (Addendum)
[place] at [facility] Discharge Instructions  RECOMMENDATIONS MADE BY THE CONSULTANT AND ANY TEST RESULTS WILL BE SENT TO YOUR REFERRING PHYSICIAN.  You were seen today by Mike Craze NP. Try taking Zyrtec or Claritin for cough and allergies. Call [facility] if interested in [facility]. Return in 3 months for labs. Return in 6 months for labs and follow up.   Thank you for choosing [place] at [facility] to provide your oncology and hematology care.  To afford each patient quality time with our provider, please arrive at least 15 minutes before your scheduled appointment time.    If you have a lab appointment with the [facility] please come in thru the  Main Entrance and check in at the main information desk  You need to re-schedule your appointment should you arrive 10 or more minutes late.  We strive to give you quality time with our providers, and arriving late affects you and other patients whose appointments are after yours.  Also, if you no show three or more times for appointments you may be dismissed from the clinic at the providers discretion.     Again, thank you for choosing [facility].  Our hope is that these requests will decrease the amount of time that you wait before being seen by our physicians.       _____________________________________________________________  Should you have questions after your visit to [facility], please contact our office at [phone] between the hours of 8:30 a.m. and 4:30 p.m.  Voicemails left after 4:30 p.m. will not be returned until the following business day.  For prescription refill requests, have your pharmacy contact our office.       Resources For Cancer Patients and their Caregivers ? American Cancer Society: Can assist with transportation, wigs, general needs, runs Look Good Feel Better.        (270)608-1131 ? Cancer Care: Provides  financial assistance, online support groups, medication/co-pay assistance.  1-800-813-HOPE (973) 034-4250) ? [place] [facility] Assists [place] cancer patients and their families through emotional , educational and financial support.  828-616-6274 ? [place] DSS Where to apply for food stamps, Medicaid and utility assistance. 731-028-7361 ? [facility]: Transportation to medical appointments. 805 760 6183 ? Social Security Administration: May apply for disability if have a Stage IV cancer. 802-660-5317 315-477-7133 ? [employer], Disability and Transit Services: Assists with nutrition, care and transit needs. [place] Support Programs: @10RELATIVEDAYS @ > Cancer Support Group  2nd Tuesday of the month 1pm-2pm, Journey Room  > Creative Journey  3rd Tuesday of the month 1130am-1pm, Journey Room  > Look Good Feel Better  1st Wednesday of the month 10am-12 noon, Journey Room (Call [facility] to register [phone])

## 2017-02-07 NOTE — Addendum Note (Signed)
Addended by: Holley Bouche on: 02/07/2017 02:53 PM   Modules accepted: Level of Service

## 2017-02-07 NOTE — Progress Notes (Addendum)
[address] [place], [place] [address]   CLINIC:  Medical Oncology/Hematology  PCP:  [facility], [place] [address] [phone]   REASON FOR VISIT:  Follow-up for DLBCL s/p stem cell transplant (2007) AND Iron deficiency anemia   CURRENT THERAPY: Observation AND IV iron prn    BRIEF ONCOLOGIC HISTORY:  Oncology History   S/P chemotherapy following stabilization of the spine.  Initial biopsy was on 07/13/2005 on his bone marrow.  Thoracic vertebra was biopsied on 02/04/2005 and mediastinum on 02/03/2005.  S/P R-CHOP and achieved an incomplete response, then underwent ICE chemotherapy pre-transplant and total body radiation with autologous stem cell transplant on 01/15/2006 and maintained on Rituxan for 2 years.  Thus far without recurrence.     DLBCL (diffuse large B cell lymphoma) ([place])   01/27/2005 Imaging    PET- diffuse activity  involving the L cervical area, extensive activity in the mediastinumn particularly in the R paratracheal area and to a lesser degree in the anterior mediastinum.  Lg area in porta caval nodes in upper abd and diffuse bone activity      02/03/2005 Pathology Results    Mediastinal lymph node demonstrating diffuse large cell lymphoma, B-type.  Vertebral bone biopsy demonstrating diffuse large b-cell lymphoma      03/11/2005 - 08/04/2005 Chemotherapy    R-CHOP x 8 cycles      05/04/2005 Imaging    PET- near resolution of previously identified uptake.      07/13/2005 Bone Marrow Biopsy    Bone marrow aspiration and biopsy is negative for lymphoma      08/31/2005 Imaging    PET- further response to therapy with findings consistent with residual disease.  Hypermetabolic activity identified within the anterior mediastinal lymph node (smaller but still pathologic).  Borderline hypermetabolic small right pleural effusion      10/20/2005 - 11/13/2005 Chemotherapy    R-ICE x 2 cycles at [facility]      01/08/2006 - 01/09/2006 Chemotherapy    Cyclophosphamide 600 mg daily x 2      01/10/2006 - 01/14/2006 Radiation Therapy    Total body radiation twice daily      01/15/2006 Bone Marrow Transplant    Stem cell transplant at [facility]      04/05/2006 - 12/05/2007 Chemotherapy    Rituxan weekly x 4 every 6 months x 2 years        INTERVAL HISTORY:  Mr. [name] 66 y.o. male returns for routine follow-up for [facility] s/p stem cell transplant in 2007, as well as iron deficiency anemia.   He is here today with his wife. Overall, he tells me that he has been feeling great. Both his appetite and energy levels are 100%.  He has been working on losing weight by being more active and trying to eat a more healthy diet.  Denies fevers, night sweats, new lumps, or unexplained weight loss.   He has occasional fatigue; most recent episode a few weeks ago where he felt very weak "like my blood counts were low."  He was called with his most recent labs a few weeks ago and does not need IV iron infusion at that time. Denies any blood in his stool or dark/tarry stools.  Denies hematuria, nosebleeds, or gingival bleeding. He has a chronic cough, which he attributes to his sinuses/allergies.  The peripheral neuropathy to his feet that he has had for many years is improving.    Overall, he feels "pretty  good."      REVIEW OF SYSTEMS:  Review of Systems  Constitutional: Positive for fatigue (occasional fatigue). Negative for chills and fever.  HENT:  Negative.  Negative for lump/mass and nosebleeds.   Eyes: Negative.   Respiratory: Positive for cough. Negative for shortness of breath.   Cardiovascular: Positive for leg swelling. Negative for chest pain.  Gastrointestinal: Negative.  Negative for abdominal pain, blood in stool, constipation, diarrhea, nausea and vomiting.  Endocrine: Negative.   Genitourinary: Negative.  Negative for dysuria and hematuria.   Musculoskeletal: Negative.  Negative for arthralgias.    Skin: Negative.  Negative for rash.  Neurological: Positive for numbness. Negative for dizziness and headaches.  Hematological: Negative.  Negative for adenopathy. Does not bruise/bleed easily.  Psychiatric/Behavioral: Negative.  Negative for depression and sleep disturbance. The patient is not nervous/anxious.      PAST MEDICAL/SURGICAL HISTORY:  Past Medical History:  Diagnosis Date  . Asthma   . Diabetes mellitus without complication ([place])   . DLBCL (diffuse large B cell lymphoma) ([place]) 02/28/2009  . Edema   . Heart failure, diastolic, chronic ([place])    patient denies  . Hyperlipidemia, mixed   . Hypertension   . IDA (iron deficiency anemia)   . Large cell lymphoma ([place]) 01/2005   autologous stem cell transplant 12/2005  . Obesity, morbid (more than 100 lbs over ideal weight or BMI > 40) ([facility])   . Venous insufficiency 04/29/2011   Past Surgical History:  Procedure Laterality Date  . BACK SURGERY  2006   T6 vertebrae removed/titanium placed  . COLONOSCOPY  11/24/2004   Polyps in the left colon ablated/removed as described above.  Two submucosal lesions consistent with lipomas as described above not  manipulated./ Normal rectum  . COLONOSCOPY  06/20/2012   MULTIPLE RECTAL AND COLONIC POLYPS  . COLONOSCOPY N/A 03/08/2015   RMR: Capacious, redundant colon. Multiple colonic and rectosigmoid polyps removed. ablated as described above. colonic lipoma abnormal appearing terminal ileum likely a variant of normal). Howeverwith history  biopsies obtained.   . ESOPHAGOGASTRODUODENOSCOPY N/A 03/08/2015   RMR: Hiatal hernia Polypoid gastric mucosa with multiple gastric polyps. largest polyp removed via snare polypectomgy hemostasis clip placed at base. Status post gastric biopsy. Status post video capsule placement.   Marland Kitchen GIVENS CAPSULE STUDY N/A 03/08/2015   Procedure: GIVENS CAPSULE STUDY;  Surgeon: Daneil Dolin, MD;  [facility];  Service: Endoscopy;  Laterality: N/A;  . LIMBAL STEM  CELL TRANSPLANT    . LUNG BIOPSY  6/06  . MULTIPLE TOOTH EXTRACTIONS  04/2005  . port a cath placement    . PORT-A-CATH REMOVAL  09/08/2012   Procedure: REMOVAL PORT-A-CATH;  Surgeon: Melrose Nakayama, MD;  Location: [facility];  Service: Thoracic;  Laterality: N/A;     SOCIAL HISTORY:  Social History   Social History  . Marital status: Married    Spouse name: N/A  . Number of children: 2  . Years of education: N/A   Occupational History  . disability due to back Unemployed   Social History Main Topics  . Smoking status: Former Smoker    Packs/day: 0.50    Years: 15.00    Quit date: 11/11/2002  . Smokeless tobacco: Never Used  . Alcohol use No  . Drug use: No  . Sexual activity: Not on file   Other Topics Concern  . Not on file   Social History Narrative   Married   No regular exercise    FAMILY  HISTORY:  Family History  Problem Relation Age of Onset  . Cancer Mother        lung  . Cancer Father        prostate  . Colon cancer Neg Hx     CURRENT MEDICATIONS:  Outpatient Encounter Prescriptions as of 02/05/2017  Medication Sig Note  . baclofen (LIORESAL) 10 MG tablet Take 10 mg by mouth 3 (three) times daily as needed. For hand cramps   . BAYER CONTOUR NEXT TEST test strip TEST FOUR TIMES A DAY AND AS NEEDED DEPENDING ON SYMPTOMS 03/30/2016: Received from: External Pharmacy  . BAYER MICROLET LANCETS lancets daily. for testing 03/30/2016: Received from: External Pharmacy Received Sig: TEST once daily  . glimepiride (AMARYL) 1 MG tablet  03/30/2016: Received from: External Pharmacy  . hydrochlorothiazide (HYDRODIURIL) 25 MG tablet Take 25 mg by mouth daily. 02/04/2015: Received from: External Pharmacy  . ibuprofen (ADVIL,MOTRIN) 200 MG tablet Take 200 mg by mouth every 6 (six) hours as needed. Use seldom. For pain   . lisinopril (PRINIVIL,ZESTRIL) 2.5 MG tablet  03/30/2016: Received from: External Pharmacy  . simvastatin (ZOCOR) 20 MG tablet  03/30/2016: Received from:  External Pharmacy  . simvastatin (ZOCOR) 40 MG tablet Take 40 mg by mouth at bedtime.   . terbinafine (LAMISIL) 250 MG tablet Take 250 mg by mouth daily.   . [DISCONTINUED] metFORMIN (GLUCOPHAGE) 1000 MG tablet Take 1,000 mg by mouth 2 (two) times daily. 03/30/2016: Received from: External Pharmacy Received Sig: take 1 tablet by mouth twice a day  . [DISCONTINUED] pioglitazone (ACTOS) 30 MG tablet  03/30/2016: Received from: External Pharmacy  . [DISCONTINUED] potassium chloride (K-DUR) 10 MEQ tablet Take 40 meq TODAY 7/15, then take 10 meq daily (Patient not taking: Reported on 03/30/2016) 04/16/2015: Taking 10 meq 3 times week   No facility-administered encounter medications on file as of 02/05/2017.     ALLERGIES:  Allergies  Allergen Reactions  . Penicillins Rash     PHYSICAL EXAM:  ECOG Performance status: 0-1 - Mild symptoms; remains independent   BP: 125/58 HR: 88 Resp: 16 O2 sat: 99%  Filed Weights   02/05/17 1405  Weight: 276 lb (125.2 kg)    Physical Exam  Constitutional: He is oriented to person, place, and time and well-developed, well-nourished, and in no distress.  HENT:  Head: Normocephalic.  Mild posterior pharyngeal erythema.   Eyes: Conjunctivae are normal. Pupils are equal, round, and reactive to light. No scleral icterus.  Neck: Normal range of motion. Neck supple.  Cardiovascular: Normal rate, regular rhythm and normal heart sounds.   Pulmonary/Chest: Effort normal and breath sounds normal. No respiratory distress.  Abdominal: Soft. Bowel sounds are normal. There is no tenderness. There is no rebound and no guarding.  Musculoskeletal: Normal range of motion. He exhibits edema (1-2+ BLE edema (chronic)).  Lymphadenopathy:    He has no cervical adenopathy.    He has no axillary adenopathy.       Right: No supraclavicular adenopathy present.       Left: No supraclavicular adenopathy present.  Neurological: He is alert and oriented to person, place, and time. No  cranial nerve deficit. Gait normal.  Skin: Skin is warm and dry. No rash noted.  Very dry skin, particularly to legs.   Psychiatric: Mood, memory, affect and judgment normal.  Nursing note and vitals reviewed.    LABORATORY DATA:  I have reviewed the labs as listed.  CBC    Component Value Date/Time   WBC  7.9 01/22/2017 1420   RBC 4.26 01/22/2017 1420   HGB 13.7 01/22/2017 1420   HCT 39.3 01/22/2017 1420   PLT 159 01/22/2017 1420   MCV 92.3 01/22/2017 1420   MCH 32.2 01/22/2017 1420   MCHC 34.9 01/22/2017 1420   RDW 14.2 01/22/2017 1420   LYMPHSABS 1.5 01/22/2017 1420   MONOABS 0.9 01/22/2017 1420   EOSABS 0.2 01/22/2017 1420   BASOSABS 0.0 01/22/2017 1420   CMP Latest Ref Rng & Units 01/22/2017 01/01/2016 04/16/2015  Glucose 65 - 99 mg/dL 103(H) 360(H) 134(H)  BUN 6 - 20 mg/dL 18 21(H) 13  Creatinine 0.61 - 1.24 mg/dL 1.35(H) 1.51(H) 1.09  Sodium 135 - 145 mmol/L 136 136 138  Potassium 3.5 - 5.1 mmol/L 3.4(L) 3.9 3.5  Chloride 101 - 111 mmol/L 100(L) 96(L) 103  CO2 22 - 32 mmol/L 25 23 27   Calcium 8.9 - 10.3 mg/dL 9.2 9.6 8.8(L)  Total Protein 6.5 - 8.1 g/dL 7.3 - 7.2  Total Bilirubin 0.3 - 1.2 mg/dL 0.9 - 0.6  Alkaline Phos 38 - 126 U/L 64 - 64  AST 15 - 41 U/L 19 - 29  ALT 17 - 63 U/L 20 - 31    Ref. Range 01/22/2017 14:20  Iron Latest Ref Range: 45 - 182 ug/dL 31 (L)  UIBC Latest Units: ug/dL 280  TIBC Latest Ref Range: 250 - 450 ug/dL 311  Saturation Ratios Latest Ref Range: 17.9 - 39.5 % 10 (L)  Ferritin Latest Ref Range: 24 - 336 ng/mL 147   PENDING LABS:    DIAGNOSTIC IMAGING:    PATHOLOGY:     ASSESSMENT & PLAN:   DLBLC s/p stem cell transplant:  -Diagnosed in 06/2005. Completed R-CHOP chemotherapy with incomplete response. Went on to have ICE chemotherapy pre-transplant and total body radiation with autologous stem cell transplant on 01/15/06. Completed 2 years of maintenance Rituxan post-transplant.  He has remained without recurrent disease since  that time.  -Clinically remains NED. He was released from stem cell transplant clinic several years ago.   -We will continue to monitor.   Iron deficiency anemia:  -Thought to be secondary to chronic GI blood loss; h/o colonoscopy and EGD in 02/2015 revealed probable AVMs.  -Last dose of IV iron in 02/2016.  Oncology Flowsheet 03/16/2016  ferumoxytol [facility]) IV 510 mg  -Last iron studies adequate for now.  Ferritin >100.  Will recheck labs in about 3 months, as he may possibly need IV iron soon (with lower iron sats at 10 on 01/22/17). Return to cancer center in 6 months with labs.  -Discussed option of trial of oral prescription iron supplementation.  He is reluctant to try oral iron given possible side effects, which is certainly reasonable.  -Discussed that if his iron levels remain stable, then we can increase time between follow-up visits to annually (from every 6 months).   Cough:  -Lungs clear to auscultation today. Cough likely secondary to post-nasal drip.  -Recommended he try OTC allergy medication, like Claritin or Zyrtec, to see if his symptoms improve. Encouraged him to follow-up with PCP if symptoms remain.   Health maintenance/Wellness promotion:  -Recommended he try the [facility] program, which is a free fitness program designed for cancer survivors to work on creating a healthy lifestyle.  -Encouraged him to continue follow-up with PCP as indicated for routine physical exams, age/gender appropriate cancer screenings, and vaccinations.  -Commended his efforts for healthy diet and exercise.     Dispo:  -Labs only in  3 months.  -Return to cancer center in 6 months with labs.    All questions were answered to patient's stated satisfaction. Encouraged patient to call with any new concerns or questions before his next visit to the cancer center and we can certain see him sooner, if needed.    Plan of care discussed with Dr. Irene Limbo, who agrees with the above aforementioned.     Orders placed this encounter:  Orders Placed This Encounter  Procedures  . CBC with Differential/Platelet  . Comprehensive metabolic panel  . Ferritin  . Iron and TIBC  . Lactate dehydrogenase      Mike Craze, NP [place] [phone]

## 2017-02-19 ENCOUNTER — Ambulatory Visit (INDEPENDENT_AMBULATORY_CARE_PROVIDER_SITE_OTHER): Payer: Medicare Other | Admitting: Endocrinology

## 2017-02-19 ENCOUNTER — Encounter: Payer: Self-pay | Admitting: Endocrinology

## 2017-02-19 DIAGNOSIS — N183 Chronic kidney disease, stage 3 (moderate): Secondary | ICD-10-CM

## 2017-02-19 DIAGNOSIS — E118 Type 2 diabetes mellitus with unspecified complications: Secondary | ICD-10-CM | POA: Insufficient documentation

## 2017-02-19 DIAGNOSIS — E119 Type 2 diabetes mellitus without complications: Secondary | ICD-10-CM | POA: Insufficient documentation

## 2017-02-19 DIAGNOSIS — E1122 Type 2 diabetes mellitus with diabetic chronic kidney disease: Secondary | ICD-10-CM | POA: Diagnosis not present

## 2017-02-19 LAB — POCT GLYCOSYLATED HEMOGLOBIN (HGB A1C): Hemoglobin A1C: 6.2

## 2017-02-19 MED ORDER — GLIMEPIRIDE 1 MG PO TABS: 0.5000 mg | ORAL_TABLET | Freq: Every day | ORAL | 1 refills | Status: DC

## 2017-02-19 NOTE — Progress Notes (Signed)
Subjective:    Patient ID: [name], male    DOB: 07-Oct-1950, 66 y.o.   MRN: [id number]  HPI pt is referred by Dr Hilma Favors, for diabetes.  Pt states DM was dx'ed in 2006; he has mild neuropathy of the lower extremities, and associated renal insuff; he has never been on insulin; pt says his diet and exercise are not good; he has never had pancreatitis, pancreatic surgery, severe hypoglycemia or DKA (but he was seen in ER for severe hyperglycemia).  He brings med bottles.  He takes amaryl only.  He says cbg's are approx 100.   Past Medical History:  Diagnosis Date  . Asthma   . Diabetes mellitus without complication ([place])   . DLBCL (diffuse large B cell lymphoma) ([place]) 02/28/2009  . Edema   . Heart failure, diastolic, chronic ([place])    patient denies  . Hyperlipidemia, mixed   . Hypertension   . IDA (iron deficiency anemia)   . Large cell lymphoma ([place]) 01/2005   autologous stem cell transplant 12/2005  . Obesity, morbid (more than 100 lbs over ideal weight or BMI > 40) ([facility])   . Venous insufficiency 04/29/2011    Past Surgical History:  Procedure Laterality Date  . BACK SURGERY  2006   T6 vertebrae removed/titanium placed  . COLONOSCOPY  11/24/2004   Polyps in the left colon ablated/removed as described above.  Two submucosal lesions consistent with lipomas as described above not  manipulated./ Normal rectum  . COLONOSCOPY  06/20/2012   MULTIPLE RECTAL AND COLONIC POLYPS  . COLONOSCOPY N/A 03/08/2015   RMR: Capacious, redundant colon. Multiple colonic and rectosigmoid polyps removed. ablated as described above. colonic lipoma abnormal appearing terminal ileum likely a variant of normal). Howeverwith history  biopsies obtained.   . ESOPHAGOGASTRODUODENOSCOPY N/A 03/08/2015   RMR: Hiatal hernia Polypoid gastric mucosa with multiple gastric polyps. largest polyp removed via snare polypectomgy hemostasis clip placed at base. Status post gastric biopsy. Status post video capsule placement.     Marland Kitchen GIVENS CAPSULE STUDY N/A 03/08/2015   Procedure: GIVENS CAPSULE STUDY;  Surgeon: Daneil Dolin, MD;  [facility];  Service: Endoscopy;  Laterality: N/A;  . LIMBAL STEM CELL TRANSPLANT    . LUNG BIOPSY  6/06  . MULTIPLE TOOTH EXTRACTIONS  04/2005  . port a cath placement    . PORT-A-CATH REMOVAL  09/08/2012   Procedure: REMOVAL PORT-A-CATH;  Surgeon: Melrose Nakayama, MD;  Location: [facility];  Service: Thoracic;  Laterality: N/A;    Social History   Social History  . Marital status: Married    Spouse name: N/A  . Number of children: 2  . Years of education: N/A   Occupational History  . disability due to back Unemployed   Social History Main Topics  . Smoking status: Former Smoker    Packs/day: 0.50    Years: 15.00    Quit date: 11/11/2002  . Smokeless tobacco: Never Used  . Alcohol use No  . Drug use: No  . Sexual activity: Not on file   Other Topics Concern  . Not on file   Social History Narrative   Married   No regular exercise    Current Outpatient Prescriptions on File Prior to Visit  Medication Sig Dispense Refill  . baclofen (LIORESAL) 10 MG tablet Take 10 mg by mouth 3 (three) times daily as needed. For hand cramps    . BAYER CONTOUR NEXT TEST test strip TEST FOUR TIMES A DAY  AND AS NEEDED DEPENDING ON SYMPTOMS  0  . BAYER MICROLET LANCETS lancets daily. for testing  0  . hydrochlorothiazide (HYDRODIURIL) 25 MG tablet Take 25 mg by mouth daily.  0  . ibuprofen (ADVIL,MOTRIN) 200 MG tablet Take 200 mg by mouth every 6 (six) hours as needed. Use seldom. For pain    . lisinopril (PRINIVIL,ZESTRIL) 2.5 MG tablet     . simvastatin (ZOCOR) 40 MG tablet Take 40 mg by mouth at bedtime.  0  . terbinafine (LAMISIL) 250 MG tablet Take 250 mg by mouth daily.  0  . simvastatin (ZOCOR) 20 MG tablet      No current facility-administered medications on file prior to visit.     Allergies  Allergen Reactions  . Penicillins Rash    Family History   Problem Relation Age of Onset  . Cancer Mother        lung  . Cancer Father        prostate  . Colon cancer Neg Hx   . Diabetes Neg Hx     BP 124/70   Pulse 86   Ht 5\' 11"  (1.803 m)   Wt 274 lb (124.3 kg)   SpO2 96%   BMI 38.22 kg/m    Review of Systems denies blurry vision, headache, chest pain, sob, n/v, urinary frequency, muscle cramps, memory loss, hypoglycemia, cold intolerance, rhinorrhea, and easy bruising.  He has lost a few lbs recently.  He has dry skin.     Objective:   Physical Exam VS: see vs page GEN: no distress HEAD: head: no deformity eyes: no periorbital swelling, no proptosis external nose and ears are normal mouth: no lesion seen NECK: supple, thyroid is not enlarged CHEST WALL: no deformity LUNGS: clear to auscultation CV: reg rate and rhythm, no murmur ABD: abdomen is soft, nontender.  no hepatosplenomegaly.  not distended.  no hernia MUSCULOSKELETAL: muscle bulk and strength are grossly normal.  no obvious joint swelling.  gait is normal and steady EXTEMITIES: no deformity.  no ulcer on the feet.  feet are of normal color and temp.  2+ bilat leg edema.  There is bilateral onychomycosis of the toenails.  PULSES: dorsalis pedis intact bilat.  no carotid bruit NEURO:  cn 2-12 grossly intact.   readily moves all 4's.  sensation is intact to touch on the feet SKIN:  Normal texture and temperature.  No rash or suspicious lesion is visible.  There is spotty hyperpigmentation of the legs and feet  NODES:  None palpable at the neck PSYCH: alert, well-oriented.  Does not appear anxious nor depressed.  A1c=6.2%  I personally reviewed electrocardiogram tracing (01/01/16): Indication: severe hyperglycemia Impression: ST.  No MI.  No hypertrophy.  Bifasicular block Compared to 2016: ST is new  Lab Results  Component Value Date   CREATININE 1.35 (H) 01/22/2017   BUN 18 01/22/2017   NA 136 01/22/2017   K 3.4 (L) 01/22/2017   CL 100 (L) 01/22/2017   CO2  25 01/22/2017      Assessment & Plan:  Type 2 DM: overcontrolled, for this SU-containing regimen.  Decrease amaryl to 0.5 mg qam.  Edema: this limits rx options Renal insuff: this may explain decreased med requirement.  Patient Instructions  good diet and exercise significantly improve the control of your diabetes.  please let me know if you wish to be referred to a dietician.  high blood sugar is very risky to your health.  you should see an eye doctor  and dentist every year.  It is very important to get all recommended vaccinations.  Controlling your blood pressure and cholesterol drastically reduces the damage diabetes does to your body.  Those who smoke should quit.  Please discuss these with your doctor.  check your blood sugar once a day.  vary the time of day when you check, between before the 3 meals, and at bedtime.  also check if you have symptoms of your blood sugar being too high or too low.  please keep a record of the readings and bring it to your next appointment here (or you can bring the meter itself).  You can write it on any piece of paper.  please call us sooner if your blood sugar goes below 70, or if you have a lot of readings over 200.  Please come back for a follow-up appointment in 2-3 months.

## 2017-02-19 NOTE — Patient Instructions (Signed)
good diet and exercise significantly improve the control of your diabetes.  please let me know if you wish to be referred to a dietician.  high blood sugar is very risky to your health.  you should see an eye doctor and dentist every year.  It is very important to get all recommended vaccinations.  Controlling your blood pressure and cholesterol drastically reduces the damage diabetes does to your body.  Those who smoke should quit.  Please discuss these with your doctor.  check your blood sugar once a day.  vary the time of day when you check, between before the 3 meals, and at bedtime.  also check if you have symptoms of your blood sugar being too high or too low.  please keep a record of the readings and bring it to your next appointment here (or you can bring the meter itself).  You can write it on any piece of paper.  please call us sooner if your blood sugar goes below 70, or if you have a lot of readings over 200.  Please come back for a follow-up appointment in 2-3 months.

## 2017-05-10 ENCOUNTER — Encounter (HOSPITAL_COMMUNITY): Payer: Medicare Other | Attending: Oncology

## 2017-05-10 DIAGNOSIS — D509 Iron deficiency anemia, unspecified: Secondary | ICD-10-CM | POA: Diagnosis not present

## 2017-05-10 DIAGNOSIS — C8330 Diffuse large B-cell lymphoma, unspecified site: Secondary | ICD-10-CM | POA: Diagnosis not present

## 2017-05-10 LAB — CBC WITH DIFFERENTIAL/PLATELET
Basophils Absolute: 0 10*3/uL (ref 0.0–0.1)
Basophils Relative: 0 %
EOS ABS: 0.3 10*3/uL (ref 0.0–0.7)
EOS PCT: 8 %
HCT: 38.4 % — ABNORMAL LOW (ref 39.0–52.0)
Hemoglobin: 13.6 g/dL (ref 13.0–17.0)
LYMPHS ABS: 0.9 10*3/uL (ref 0.7–4.0)
LYMPHS PCT: 22 %
MCH: 32.8 pg (ref 26.0–34.0)
MCHC: 35.4 g/dL (ref 30.0–36.0)
MCV: 92.5 fL (ref 78.0–100.0)
Monocytes Absolute: 0.5 10*3/uL (ref 0.1–1.0)
Monocytes Relative: 12 %
Neutro Abs: 2.5 10*3/uL (ref 1.7–7.7)
Neutrophils Relative %: 58 %
PLATELETS: 162 10*3/uL (ref 150–400)
RBC: 4.15 MIL/uL — AB (ref 4.22–5.81)
RDW: 13.4 % (ref 11.5–15.5)
WBC: 4.3 10*3/uL (ref 4.0–10.5)

## 2017-05-10 LAB — COMPREHENSIVE METABOLIC PANEL
ALT: 27 U/L (ref 17–63)
AST: 25 U/L (ref 15–41)
Albumin: 3.7 g/dL (ref 3.5–5.0)
Alkaline Phosphatase: 69 U/L (ref 38–126)
Anion gap: 12 (ref 5–15)
BUN: 14 mg/dL (ref 6–20)
CHLORIDE: 99 mmol/L — AB (ref 101–111)
CO2: 24 mmol/L (ref 22–32)
CREATININE: 1.15 mg/dL (ref 0.61–1.24)
Calcium: 9 mg/dL (ref 8.9–10.3)
GFR calc Af Amer: >60 mL/min (ref >60)
Glucose, Bld: 346 mg/dL — ABNORMAL HIGH (ref 65–99)
Potassium: 3.8 mmol/L (ref 3.5–5.1)
SODIUM: 135 mmol/L (ref 135–145)
Total Bilirubin: 0.4 mg/dL (ref 0.3–1.2)
Total Protein: 6.9 g/dL (ref 6.5–8.1)

## 2017-05-10 LAB — IRON AND TIBC
Iron: 69 ug/dL (ref 45–182)
SATURATION RATIOS: 21 % (ref 17.9–39.5)
TIBC: 329 ug/dL (ref 250–450)
UIBC: 260 ug/dL

## 2017-05-10 LAB — FERRITIN: FERRITIN: 90 ng/mL (ref 24–336)

## 2017-05-10 LAB — LACTATE DEHYDROGENASE: LDH: 187 U/L (ref 98–192)

## 2017-05-18 ENCOUNTER — Other Ambulatory Visit: Payer: Self-pay

## 2017-05-18 ENCOUNTER — Inpatient Hospital Stay (HOSPITAL_COMMUNITY)
Admission: EM | Admit: 2017-05-18 | Discharge: 2017-05-21 | DRG: 871 | Disposition: A | Discharge status: Home Health | Payer: Medicare Other | Attending: Internal Medicine | Admitting: Internal Medicine

## 2017-05-18 ENCOUNTER — Emergency Department (HOSPITAL_COMMUNITY): Payer: Medicare Other

## 2017-05-18 ENCOUNTER — Encounter (HOSPITAL_COMMUNITY): Payer: Self-pay | Admitting: *Deleted

## 2017-05-18 DIAGNOSIS — Z7984 Long term (current) use of oral hypoglycemic drugs: Secondary | ICD-10-CM | POA: Diagnosis not present

## 2017-05-18 DIAGNOSIS — E119 Type 2 diabetes mellitus without complications: Secondary | ICD-10-CM

## 2017-05-18 DIAGNOSIS — K760 Fatty (change of) liver, not elsewhere classified: Secondary | ICD-10-CM | POA: Diagnosis not present

## 2017-05-18 DIAGNOSIS — N179 Acute kidney failure, unspecified: Secondary | ICD-10-CM | POA: Diagnosis present

## 2017-05-18 DIAGNOSIS — E782 Mixed hyperlipidemia: Secondary | ICD-10-CM | POA: Diagnosis present

## 2017-05-18 DIAGNOSIS — R6520 Severe sepsis without septic shock: Secondary | ICD-10-CM | POA: Diagnosis present

## 2017-05-18 DIAGNOSIS — I110 Hypertensive heart disease with heart failure: Secondary | ICD-10-CM | POA: Diagnosis present

## 2017-05-18 DIAGNOSIS — Z9484 Stem cells transplant status: Secondary | ICD-10-CM | POA: Diagnosis not present

## 2017-05-18 DIAGNOSIS — M6282 Rhabdomyolysis: Secondary | ICD-10-CM | POA: Diagnosis present

## 2017-05-18 DIAGNOSIS — J45909 Unspecified asthma, uncomplicated: Secondary | ICD-10-CM | POA: Diagnosis present

## 2017-05-18 DIAGNOSIS — Z8572 Personal history of non-Hodgkin lymphomas: Secondary | ICD-10-CM

## 2017-05-18 DIAGNOSIS — I10 Essential (primary) hypertension: Secondary | ICD-10-CM | POA: Diagnosis not present

## 2017-05-18 DIAGNOSIS — T380X5A Adverse effect of glucocorticoids and synthetic analogues, initial encounter: Secondary | ICD-10-CM | POA: Diagnosis present

## 2017-05-18 DIAGNOSIS — A419 Sepsis, unspecified organism: Principal | ICD-10-CM | POA: Diagnosis present

## 2017-05-18 DIAGNOSIS — J181 Lobar pneumonia, unspecified organism: Secondary | ICD-10-CM

## 2017-05-18 DIAGNOSIS — J189 Pneumonia, unspecified organism: Secondary | ICD-10-CM | POA: Insufficient documentation

## 2017-05-18 DIAGNOSIS — J9601 Acute respiratory failure with hypoxia: Secondary | ICD-10-CM | POA: Diagnosis not present

## 2017-05-18 DIAGNOSIS — R05 Cough: Secondary | ICD-10-CM | POA: Diagnosis not present

## 2017-05-18 DIAGNOSIS — E118 Type 2 diabetes mellitus with unspecified complications: Secondary | ICD-10-CM

## 2017-05-18 DIAGNOSIS — I5032 Chronic diastolic (congestive) heart failure: Secondary | ICD-10-CM | POA: Diagnosis present

## 2017-05-18 DIAGNOSIS — Z87891 Personal history of nicotine dependence: Secondary | ICD-10-CM | POA: Diagnosis not present

## 2017-05-18 DIAGNOSIS — E860 Dehydration: Secondary | ICD-10-CM | POA: Diagnosis present

## 2017-05-18 DIAGNOSIS — R0602 Shortness of breath: Secondary | ICD-10-CM | POA: Diagnosis not present

## 2017-05-18 DIAGNOSIS — J9600 Acute respiratory failure, unspecified whether with hypoxia or hypercapnia: Secondary | ICD-10-CM | POA: Diagnosis not present

## 2017-05-18 LAB — COMPREHENSIVE METABOLIC PANEL
ALK PHOS: 60 U/L (ref 38–126)
ALT: 21 U/L (ref 17–63)
ANION GAP: 12 (ref 5–15)
AST: 18 U/L (ref 15–41)
Albumin: 3.8 g/dL (ref 3.5–5.0)
BILIRUBIN TOTAL: 1.1 mg/dL (ref 0.3–1.2)
BUN: 23 mg/dL — ABNORMAL HIGH (ref 6–20)
CALCIUM: 9.1 mg/dL (ref 8.9–10.3)
CO2: 25 mmol/L (ref 22–32)
CREATININE: 1.79 mg/dL — AB (ref 0.61–1.24)
Chloride: 96 mmol/L — ABNORMAL LOW (ref 101–111)
GFR, EST AFRICAN AMERICAN: 44 mL/min — AB (ref >60)
GFR, EST NON AFRICAN AMERICAN: 38 mL/min — AB (ref >60)
Glucose, Bld: 231 mg/dL — ABNORMAL HIGH (ref 65–99)
Potassium: 3.7 mmol/L (ref 3.5–5.1)
SODIUM: 133 mmol/L — AB (ref 135–145)
TOTAL PROTEIN: 7.8 g/dL (ref 6.5–8.1)

## 2017-05-18 LAB — CBC
HCT: 41.3 % (ref 39.0–52.0)
HEMOGLOBIN: 14.4 g/dL (ref 13.0–17.0)
MCH: 32.3 pg (ref 26.0–34.0)
MCHC: 34.9 g/dL (ref 30.0–36.0)
MCV: 92.6 fL (ref 78.0–100.0)
PLATELETS: 191 10*3/uL (ref 150–400)
RBC: 4.46 MIL/uL (ref 4.22–5.81)
RDW: 13.2 % (ref 11.5–15.5)
WBC: 9.8 10*3/uL (ref 4.0–10.5)

## 2017-05-18 LAB — BLOOD GAS, ARTERIAL
Acid-base deficit: 1.3 mmol/L (ref 0.0–2.0)
Bicarbonate: 23.9 mmol/L (ref 20.0–28.0)
Drawn by: 28459
FIO2: 28
O2 SAT: 94 %
PCO2 ART: 31.5 mmHg — AB (ref 32.0–48.0)
PH ART: 7.458 — AB (ref 7.350–7.450)
Patient temperature: 37
pO2, Arterial: 68.6 mmHg — ABNORMAL LOW (ref 83.0–108.0)

## 2017-05-18 LAB — APTT: aPTT: 31 seconds (ref 24–36)

## 2017-05-18 LAB — LIPASE, BLOOD: Lipase: 21 U/L (ref 11–51)

## 2017-05-18 LAB — PROTIME-INR
INR: 1.24
PROTHROMBIN TIME: 15.5 s — AB (ref 11.4–15.2)

## 2017-05-18 LAB — BRAIN NATRIURETIC PEPTIDE: B NATRIURETIC PEPTIDE 5: 73 pg/mL (ref 0.0–100.0)

## 2017-05-18 LAB — INFLUENZA PANEL BY PCR (TYPE A & B)
Influenza A By PCR: NEGATIVE
Influenza B By PCR: NEGATIVE

## 2017-05-18 LAB — GLUCOSE, CAPILLARY: Glucose-Capillary: 250 mg/dL — ABNORMAL HIGH (ref 65–99)

## 2017-05-18 LAB — TROPONIN I: Troponin I: <0.03 ng/mL (ref <0.03)

## 2017-05-18 LAB — PROCALCITONIN: PROCALCITONIN: 27 ng/mL

## 2017-05-18 LAB — LACTIC ACID, PLASMA: Lactic Acid, Venous: 2 mmol/L (ref 0.5–1.9)

## 2017-05-18 MED ORDER — DEXTROSE 5 % IV SOLN
500.0000 mg | INTRAVENOUS | Status: DC
  Administered 2017-05-19 (×2): 500 mg via INTRAVENOUS
  Filled 2017-05-18 (×4): qty 500

## 2017-05-18 MED ORDER — SODIUM CHLORIDE 0.9 % IV SOLN
INTRAVENOUS | Status: DC
  Administered 2017-05-18 – 2017-05-21 (×5): via INTRAVENOUS

## 2017-05-18 MED ORDER — ACETAMINOPHEN 325 MG PO TABS
650.0000 mg | ORAL_TABLET | Freq: Four times a day (QID) | ORAL | Status: DC | PRN
  Administered 2017-05-18 – 2017-05-20 (×3): 650 mg via ORAL
  Filled 2017-05-18 (×3): qty 2

## 2017-05-18 MED ORDER — ALBUTEROL SULFATE (2.5 MG/3ML) 0.083% IN NEBU: 2.5000 mg | INHALATION_SOLUTION | RESPIRATORY_TRACT | Status: DC | PRN

## 2017-05-18 MED ORDER — IPRATROPIUM-ALBUTEROL 0.5-2.5 (3) MG/3ML IN SOLN
3.0000 mL | Freq: Four times a day (QID) | RESPIRATORY_TRACT | Status: DC
  Administered 2017-05-18: 3 mL via RESPIRATORY_TRACT
  Filled 2017-05-18: qty 3

## 2017-05-18 MED ORDER — DEXTROSE 5 % IV SOLN
1.0000 g | Freq: Once | INTRAVENOUS | Status: AC
  Administered 2017-05-18: 1 g via INTRAVENOUS
  Filled 2017-05-18: qty 10

## 2017-05-18 MED ORDER — IOPAMIDOL (ISOVUE-370) INJECTION 76%
80.0000 mL | Freq: Once | INTRAVENOUS | Status: AC | PRN
  Administered 2017-05-18: 80 mL via INTRAVENOUS

## 2017-05-18 MED ORDER — INSULIN ASPART 100 UNIT/ML ~~LOC~~ SOLN
0.0000 [IU] | Freq: Every day | SUBCUTANEOUS | Status: DC
  Administered 2017-05-18: 2 [IU] via SUBCUTANEOUS
  Administered 2017-05-20: 3 [IU] via SUBCUTANEOUS

## 2017-05-18 MED ORDER — INSULIN ASPART 100 UNIT/ML ~~LOC~~ SOLN
0.0000 [IU] | Freq: Three times a day (TID) | SUBCUTANEOUS | Status: DC
  Administered 2017-05-19: 5 [IU] via SUBCUTANEOUS
  Administered 2017-05-19 (×2): 11 [IU] via SUBCUTANEOUS
  Administered 2017-05-20: 8 [IU] via SUBCUTANEOUS
  Administered 2017-05-20 – 2017-05-21 (×5): 5 [IU] via SUBCUTANEOUS

## 2017-05-18 MED ORDER — ENOXAPARIN SODIUM 40 MG/0.4ML ~~LOC~~ SOLN
40.0000 mg | SUBCUTANEOUS | Status: DC
  Administered 2017-05-18 – 2017-05-20 (×3): 40 mg via SUBCUTANEOUS
  Filled 2017-05-18 (×3): qty 0.4

## 2017-05-18 MED ORDER — SIMVASTATIN 20 MG PO TABS
20.0000 mg | ORAL_TABLET | Freq: Every day | ORAL | Status: DC
  Administered 2017-05-18 – 2017-05-19 (×2): 20 mg via ORAL
  Filled 2017-05-18 (×2): qty 1

## 2017-05-18 MED ORDER — PREDNISONE 20 MG PO TABS
40.0000 mg | ORAL_TABLET | Freq: Every day | ORAL | Status: DC
  Administered 2017-05-18 – 2017-05-20 (×2): 40 mg via ORAL
  Filled 2017-05-18 (×2): qty 2

## 2017-05-18 MED ORDER — SODIUM CHLORIDE 0.9 % IV BOLUS (SEPSIS)
500.0000 mL | Freq: Once | INTRAVENOUS | Status: AC
  Administered 2017-05-18: 500 mL via INTRAVENOUS

## 2017-05-18 MED ORDER — HYDRALAZINE HCL 20 MG/ML IJ SOLN: 5.0000 mg | INTRAMUSCULAR | Status: DC | PRN

## 2017-05-18 MED ORDER — AZITHROMYCIN 250 MG PO TABS
500.0000 mg | ORAL_TABLET | Freq: Once | ORAL | Status: AC
  Administered 2017-05-18: 500 mg via ORAL
  Filled 2017-05-18: qty 2

## 2017-05-18 MED ORDER — MORPHINE SULFATE (PF) 4 MG/ML IV SOLN
4.0000 mg | Freq: Once | INTRAVENOUS | Status: AC
  Administered 2017-05-18: 4 mg via INTRAVENOUS
  Filled 2017-05-18: qty 1

## 2017-05-18 MED ORDER — IPRATROPIUM-ALBUTEROL 0.5-2.5 (3) MG/3ML IN SOLN
3.0000 mL | Freq: Three times a day (TID) | RESPIRATORY_TRACT | Status: DC
  Administered 2017-05-19 – 2017-05-21 (×7): 3 mL via RESPIRATORY_TRACT
  Filled 2017-05-18 (×7): qty 3

## 2017-05-18 MED ORDER — DEXTROSE 5 % IV SOLN
1.0000 g | INTRAVENOUS | Status: DC
  Administered 2017-05-19 – 2017-05-21 (×3): 1 g via INTRAVENOUS
  Filled 2017-05-18 (×5): qty 10

## 2017-05-18 NOTE — Progress Notes (Signed)
CRITICAL VALUE ALERT  Critical Value:  Lactic 2.0  Date & Time Notied:  05/18/2017 2315  Provider Notified: Schorr NP  Orders Received/Actions taken: 500cc NS Bolus

## 2017-05-18 NOTE — H&P (Signed)
History and Physical    [name] WNU:[id number] DOB: 02/21/1951 DOA: 05/18/2017  PCP: Redmond School, MD Consultants:  Talbert Cage - oncology; GI - [place] Patient coming from:  Home - lives with son; [place]: son  Chief Complaint: SOB  HPI: [name] is a 66 y.o. 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  Abdominal pain in LLQ.  No n/v/d.  SOB since Thursday or Friday.  Chronic cough.  Cough is productive of a small amount of white sputum.  Excessively sleepy during my evaluation and so history was limited.   ED Course: Tachypnea, hypoxia.  Abd CT negative.  CXR with PNA - given Rocephin and Azithromycin.  Just prior to transfer to SDU, he was sitting on the side of the bed with the urinal and he slid to the floor.  He denies pain.  Review of Systems: As per HPI; otherwise review of systems reviewed and negative. This was limited by his somnolence.  Ambulatory Status:  Ambulates without assistance  Past Medical History:  Diagnosis Date  . Asthma   . Diabetes mellitus without complication ([place])   . DLBCL (diffuse large B cell lymphoma) ([place]) 02/28/2009  . Edema   . Heart failure, diastolic, chronic ([place])    patient denies  . Hyperlipidemia, mixed   . Hypertension   . IDA (iron deficiency anemia)   . Large cell lymphoma ([place]) 01/2005   autologous stem cell transplant 12/2005  . Obesity, morbid (more than 100 lbs over ideal weight or BMI > 40) ([facility])   . Venous insufficiency 04/29/2011    Past Surgical History:  Procedure Laterality Date  . BACK SURGERY  2006   T6 vertebrae removed/titanium placed  . COLONOSCOPY  11/24/2004   Polyps in the left colon ablated/removed as described above.  Two submucosal lesions consistent with lipomas as described above not  manipulated./ Normal rectum  . COLONOSCOPY   06/20/2012   MULTIPLE RECTAL AND COLONIC POLYPS  . COLONOSCOPY N/A 03/08/2015   RMR: Capacious, redundant colon. Multiple colonic and rectosigmoid polyps removed. ablated as described above. colonic lipoma abnormal appearing terminal ileum likely a variant of normal). Howeverwith history  biopsies obtained.   . ESOPHAGOGASTRODUODENOSCOPY N/A 03/08/2015   RMR: Hiatal hernia Polypoid gastric mucosa with multiple gastric polyps. largest polyp removed via snare polypectomgy hemostasis clip placed at base. Status post gastric biopsy. Status post video capsule placement.   Marland Kitchen GIVENS CAPSULE STUDY N/A 03/08/2015   Procedure: GIVENS CAPSULE STUDY;  Surgeon: Daneil Dolin, MD;  [facility];  Service: Endoscopy;  Laterality: N/A;  . LIMBAL STEM CELL TRANSPLANT    . LUNG BIOPSY  6/06  . MULTIPLE TOOTH EXTRACTIONS  04/2005  . port a cath placement    . PORT-A-CATH REMOVAL  09/08/2012   Procedure: REMOVAL PORT-A-CATH;  Surgeon: Melrose Nakayama, MD;  Location: [facility];  Service: Thoracic;  Laterality: N/A;    Social History   Social History  . Marital status: Married    Spouse name: N/A  . Number of children: 2  . Years of education: N/A   Occupational History  . disability due to back Unemployed   Social History Main Topics  . Smoking status: Former Smoker    Packs/day: 0.50    Years: 15.00    Quit date: 11/11/2002  .  Smokeless tobacco: Never Used  . Alcohol use No  . Drug use: No  . Sexual activity: Not on file   Other Topics Concern  . Not on file   Social History Narrative   Married   No regular exercise    Allergies  Allergen Reactions  . Penicillins Rash    Has patient had a PCN reaction causing immediate rash, facial/tongue/throat swelling, SOB or lightheadedness with hypotension: Unknown Has patient had a PCN reaction causing severe rash involving mucus membranes or skin necrosis: Unknown Has patient had a PCN reaction that required hospitalization: Unknown Has  patient had a PCN reaction occurring within the last 10 years: No If all of the above answers are "NO", then may proceed with Cephalosporin use.     Family History  Problem Relation Age of Onset  . Cancer Mother        lung  . Cancer Father        prostate  . Colon cancer Neg Hx   . Diabetes Neg Hx     Prior to Admission medications   Medication Sig Start Date End Date Taking? Authorizing Provider  glimepiride (AMARYL) 1 MG tablet Take 0.5 tablets (0.5 mg total) by mouth daily with breakfast. 02/19/17  Yes Renato Shin, MD  hydrochlorothiazide (HYDRODIURIL) 25 MG tablet Take 25 mg by mouth daily. 01/29/15  Yes [provider]  ibuprofen (ADVIL,MOTRIN) 200 MG tablet Take 200 mg by mouth every 6 (six) hours as needed. Use seldom. For pain   Yes [provider]  lisinopril (PRINIVIL,ZESTRIL) 2.5 MG tablet Take 2.5 mg by mouth daily.  03/28/16  Yes [provider]  simvastatin (ZOCOR) 20 MG tablet Take 20 mg by mouth daily at 6 PM.  03/28/16  Yes [provider]    Physical Exam: Vitals:   05/18/17 2030 05/18/17 2102 05/18/17 2122 05/18/17 2130  BP: (!) 131/58 125/63  (!) 118/58  Pulse:   (!) 113 (!) 113  Resp: (!) 34  (!) 44 (!) 37  Temp:   (!) 101 F (38.3 C)   TempSrc:   Oral   SpO2:   93% 93%  Weight:   123.7 kg (272 lb 11.3 oz)   Height:   5' 10.5" (1.791 m)      General:  Appears quite somnolent Eyes:  PERRL, EOMI, normal lids, iris ENT:  grossly normal hearing, lips & tongue, mmm; poor dentition Neck:  no LAD, masses or thyromegaly; no carotid bruits Cardiovascular:  Tachycardia, no m/r/g. No LE edema.  Respiratory:  CTA bilaterally with no wheezes/rales/rhonchi.  Mildly increased respiratory effort. Abdomen:  soft, NT, ND, NABS Skin:  no rash or induration seen on limited exam Musculoskeletal:  grossly normal tone BUE/BLE, good ROM, no bony abnormality Psychiatric:  grossly normal mood and affect, speech fluent and appropriate,  AOx3 Neurologic:  CN 2-12 grossly intact, moves all extremities in coordinated fashion, sensation intact    Radiological Exams on Admission: Dg Chest 2 View  Result Date: 05/18/2017 CLINICAL DATA:  Shortness of breath, productive cough. EXAM: CHEST  2 VIEW COMPARISON:  Radiographs of December 01, 2012. FINDINGS: Stable cardiomediastinal silhouette. Atherosclerosis of thoracic aorta is noted. Mild central pulmonary vascular congestion is noted. Increased bibasilar atelectasis or edema is noted with mild bilateral pleural effusions. Status post surgical fixation of midthoracic spine. IMPRESSION: Aortic atherosclerosis. Mild central pulmonary vascular congestion. Increased bibasilar atelectasis or edema is noted with mild bilateral pleural effusions. Electronically Signed   By: Marijo Conception,  M.D.   On: 05/18/2017 12:43   Ct Angio Chest Pe W And/or Wo Contrast  Result Date: 05/18/2017 CLINICAL DATA:  Shortness of breath today and cough. Abdomen pain. Suspect diverticulitis. EXAM: CT ANGIOGRAPHY CHEST CT ABDOMEN AND PELVIS WITH CONTRAST TECHNIQUE: Multidetector CT imaging of the chest was performed using the standard protocol during bolus administration of intravenous contrast. Multiplanar CT image reconstructions and MIPs were obtained to evaluate the vascular anatomy. Multidetector CT imaging of the abdomen and pelvis was performed using the standard protocol during bolus administration of intravenous contrast. CONTRAST:  80 mL Isovue 370. COMPARISON:  None. FINDINGS: CTA CHEST FINDINGS Cardiovascular: Satisfactory opacification of the pulmonary arteries to the segmental level. No evidence of pulmonary embolism. The heart size is mildly enlarged. No pericardial effusion. Mediastinum/Nodes: No enlarged mediastinal, hilar, or axillary lymph nodes. Thyroid gland, trachea, and esophagus demonstrate no significant findings. Lungs/Pleura: There is a small left pleural effusion. Patchy consolidation of the left  lower lobe, inferior left upper lobe, peripheral right lower lobe and to a lesser degree right middle lobe are noted. Musculoskeletal: Postsurgical changes of thoracic spine are noted. Review of the MIP images confirms the above findings. CT ABDOMEN and PELVIS FINDINGS Hepatobiliary: There is diffuse low density of the liver without vessel displacement. No focal liver lesion is identified. The gallbladder is normal. The biliary tree is normal. Pancreas: Unremarkable. No pancreatic ductal dilatation or surrounding inflammatory changes. Spleen: No focal or acute abnormality identified within the spleen. Adrenals/Urinary Tract: Adrenal glands are unremarkable. Kidneys are normal, without renal calculi, focal lesion, or hydronephrosis. Bladder is unremarkable. Stomach/Bowel: There is a small hiatal hernia. The stomach is otherwise normal. There is no small bowel obstruction. There is no evidence of diverticulitis. The appendix is not definitely seen but no inflammation is noted around cecum. Vascular/Lymphatic: Aortic atherosclerosis. No enlarged abdominal or pelvic lymph nodes. Reproductive: Prostate is unremarkable. Other: There is mild umbilical herniation of mesenteric fat. Musculoskeletal: Degenerative joint changes of the spine are noted. Review of the MIP images confirms the above findings. IMPRESSION: No pulmonary embolus. Patchy consolidations of bilateral lungs suspicious for developing pneumonias. Small left pleural effusion. No evidence of diverticulitis. No acute abnormality identified in the abdomen and pelvis. Fatty infiltration of liver. Electronically Signed   By: Abelardo Diesel M.D.   On: 05/18/2017 16:26   Ct Abdomen Pelvis W Contrast  Result Date: 05/18/2017 CLINICAL DATA:  Shortness of breath today and cough. Abdomen pain. Suspect diverticulitis. EXAM: CT ANGIOGRAPHY CHEST CT ABDOMEN AND PELVIS WITH CONTRAST TECHNIQUE: Multidetector CT imaging of the chest was performed using the standard  protocol during bolus administration of intravenous contrast. Multiplanar CT image reconstructions and MIPs were obtained to evaluate the vascular anatomy. Multidetector CT imaging of the abdomen and pelvis was performed using the standard protocol during bolus administration of intravenous contrast. CONTRAST:  80 mL Isovue 370. COMPARISON:  None. FINDINGS: CTA CHEST FINDINGS Cardiovascular: Satisfactory opacification of the pulmonary arteries to the segmental level. No evidence of pulmonary embolism. The heart size is mildly enlarged. No pericardial effusion. Mediastinum/Nodes: No enlarged mediastinal, hilar, or axillary lymph nodes. Thyroid gland, trachea, and esophagus demonstrate no significant findings. Lungs/Pleura: There is a small left pleural effusion. Patchy consolidation of the left lower lobe, inferior left upper lobe, peripheral right lower lobe and to a lesser degree right middle lobe are noted. Musculoskeletal: Postsurgical changes of thoracic spine are noted. Review of the MIP images confirms the above findings. CT ABDOMEN and PELVIS FINDINGS Hepatobiliary: There is  diffuse low density of the liver without vessel displacement. No focal liver lesion is identified. The gallbladder is normal. The biliary tree is normal. Pancreas: Unremarkable. No pancreatic ductal dilatation or surrounding inflammatory changes. Spleen: No focal or acute abnormality identified within the spleen. Adrenals/Urinary Tract: Adrenal glands are unremarkable. Kidneys are normal, without renal calculi, focal lesion, or hydronephrosis. Bladder is unremarkable. Stomach/Bowel: There is a small hiatal hernia. The stomach is otherwise normal. There is no small bowel obstruction. There is no evidence of diverticulitis. The appendix is not definitely seen but no inflammation is noted around cecum. Vascular/Lymphatic: Aortic atherosclerosis. No enlarged abdominal or pelvic lymph nodes. Reproductive: Prostate is unremarkable. Other: There  is mild umbilical herniation of mesenteric fat. Musculoskeletal: Degenerative joint changes of the spine are noted. Review of the MIP images confirms the above findings. IMPRESSION: No pulmonary embolus. Patchy consolidations of bilateral lungs suspicious for developing pneumonias. Small left pleural effusion. No evidence of diverticulitis. No acute abnormality identified in the abdomen and pelvis. Fatty infiltration of liver. Electronically Signed   By: Abelardo Diesel M.D.   On: 05/18/2017 16:26    EKG: Independently reviewed.  Sinus tachycardia with rate 113; PVCs and nonspecific ST changes with no evidence of acute ischemia   Labs on Admission: I have personally reviewed the available labs and imaging studies at the time of the admission.  Pertinent labs:   ABG: 7.458/31.5/68.6 Glucose 231 BUN 23/Creatinine 1.79/GFR 44; 14/1.15/>60 on 9/17 BNP negative Troponin <0.03 Flu negative WBC 9.8   Assessment/Plan Principal Problem:   Sepsis due to pneumonia [facility]) Active Problems:   Essential hypertension   Diabetes (HCC)   Acute renal failure (ARF) (HCC)   Sepsis due to PNA -Fever, tachycardia, tachypnea with pending lactate  -While awaiting blood cultures, this appears to be a preseptic condition. -Sepsis protocol initiated --Given productive cough, fever to 101, mildly decreased oxygen saturation, and developing bilateral infiltrates on chest CT, most likely community-acquired pneumonia.  -Influenza negative. -CURB-65 score is 4, meaning that the patient has a 40% risk of death; will place in SDU. -Pneumonia Severity Index (PSI) is Class 4, 9% mortality. -Corticosteroids have been to shown to low overall mortality rate; risk of ARDS; and need for mechanical ventilation.  This is particularly true in severe PNA (class 3+ PSI).  Will add 40 mg prednisone daily for 3-7 days. -Will start Azithromycin 500 mg daily AND Rocephin due to no risk factors for MDR cause. -Additional complicating  factors include: hypoxia/hypoxemia; persistent tachypnea. -NS @ 75cc/hr -Fever control -Repeat CBC in am -Sputum cultures -Blood cultures -Strep pneumo testing -Will order procalcitonin level.  >0.5 indicates infection and >>0.5 indicates more serious disease.  As the procalcitonin level normalizes, it will be reasonable to consider de-escalation of antibiotic coverage. -albuterol PRN -Standing Duonebs -mucinex -Blood and urine cultures pending -Will admit to SDU and continue to monitor -Will add HIV -Will trend lactate  Acute renal failure  -Likely due to prerenal failure secondary to dehydration in the setting of infection and continuation of ACEI, diruetic and NSAIDs use. -Hold nephrotoxic medications for now -IVF as above -Check FeNa -Follow up renal function by BMP -Avoid ACEI and NSAIDs  HTN -Holding HCTZ and Lisinopril -Will cover with IV prn hydralazine  DM -Recent A1c 6.2 -hold Amaryl -Cover with moderate-scale SSI   DVT prophylaxis:  Lovenox  Code Status: Full - confirmed with patient Family Communication: None present Disposition Plan:  Home once clinically improved Consults called: None  Admission status: Admit -  It is my clinical opinion that admission to INPATIENT is reasonable and necessary because this patient will require at least 2 midnights in the hospital to treat this condition based on the medical complexity of the problems presented.  Given the aforementioned information, the predictability of an adverse outcome is felt to be significant.    Karmen Bongo MD [facility]  If note is complete, please contact covering daytime or nighttime physician. [email or web address] Password [facility]  05/18/2017, 9:46 PM

## 2017-05-18 NOTE — ED Provider Notes (Signed)
[facility] DEPT Provider Note   CSN: [id number] Arrival date & time: 05/18/17  1139     History   Chief Complaint Chief Complaint  Patient presents with  . Abdominal Pain  . Shortness of Breath    HPI [name] is a 66 y.o. male.  HPI Pt has had a cough for months.  He has not seen anyone for it.  On Sunday it started to increase and then he started having pain.   The pain is on the left side of his abdomen.  It increases with coughing.  No vomiting or diarrhea.  He does feel short of breath.  No fevers.  NO  Trouble urinating.  Past Medical History:  Diagnosis Date  . Asthma   . Diabetes mellitus without complication ([facility])   . DLBCL (diffuse large B cell lymphoma) ([place]) 02/28/2009  . Edema   . Heart failure, diastolic, chronic ([place])    patient denies  . Hyperlipidemia, mixed   . Hypertension   . IDA (iron deficiency anemia)   . Large cell lymphoma ([place]) 01/2005   autologous stem cell transplant 12/2005  . Obesity, morbid (more than 100 lbs over ideal weight or BMI > 40) ([facility])   . Venous insufficiency 04/29/2011    Patient Active Problem List   Diagnosis Date Noted  . Diabetes ([place]) 02/19/2017  . Essential hypertension 04/03/2015  . IDA (iron deficiency anemia)   . Mucosal abnormality of stomach   . History of colonic polyps   . Hx of adenomatous colonic polyps 06/13/2012  . Venous insufficiency 04/29/2011  . DLBCL (diffuse large B cell lymphoma) ([place]) 02/28/2009  . HYPERLIPIDEMIA-MIXED 02/28/2009  . OBESITY-MORBID (>100') 02/28/2009  . Anemia 02/28/2009  . HYPERTENSION, UNSPECIFIED 02/28/2009  . DIASTOLIC HEART FAILURE, CHRONIC 02/28/2009  . EDEMA 02/28/2009    Past Surgical History:  Procedure Laterality Date  . BACK SURGERY  2006   T6 vertebrae removed/titanium placed  . COLONOSCOPY  11/24/2004   Polyps in the left colon ablated/removed as described above.  Two submucosal lesions consistent with lipomas as described above not  manipulated./ Normal  rectum  . COLONOSCOPY  06/20/2012   MULTIPLE RECTAL AND COLONIC POLYPS  . COLONOSCOPY N/A 03/08/2015   RMR: Capacious, redundant colon. Multiple colonic and rectosigmoid polyps removed. ablated as described above. colonic lipoma abnormal appearing terminal ileum likely a variant of normal). Howeverwith history  biopsies obtained.   . ESOPHAGOGASTRODUODENOSCOPY N/A 03/08/2015   RMR: Hiatal hernia Polypoid gastric mucosa with multiple gastric polyps. largest polyp removed via snare polypectomgy hemostasis clip placed at base. Status post gastric biopsy. Status post video capsule placement.   Marland Kitchen GIVENS CAPSULE STUDY N/A 03/08/2015   Procedure: GIVENS CAPSULE STUDY;  Surgeon: Daneil Dolin, MD;  Location: [facility];  Service: Endoscopy;  Laterality: N/A;  . LIMBAL STEM CELL TRANSPLANT    . LUNG BIOPSY  6/06  . MULTIPLE TOOTH EXTRACTIONS  04/2005  . port a cath placement    . PORT-A-CATH REMOVAL  09/08/2012   Procedure: REMOVAL PORT-A-CATH;  Surgeon: Melrose Nakayama, MD;  Location: [facility] OR;  Service: Thoracic;  Laterality: N/A;       Home Medications    Prior to Admission medications   Medication Sig Start Date End Date Taking? Authorizing Provider  glimepiride (AMARYL) 1 MG tablet Take 0.5 tablets (0.5 mg total) by mouth daily with breakfast. 02/19/17  Yes Renato Shin, MD  hydrochlorothiazide (HYDRODIURIL) 25 MG tablet Take 25 mg by mouth daily. 01/29/15  Yes [provider]  ibuprofen (ADVIL,MOTRIN) 200 MG tablet Take 200 mg by mouth every 6 (six) hours as needed. Use seldom. For pain   Yes [provider]  lisinopril (PRINIVIL,ZESTRIL) 2.5 MG tablet Take 2.5 mg by mouth daily.  03/28/16  Yes [provider]  simvastatin (ZOCOR) 20 MG tablet Take 20 mg by mouth daily at 6 PM.  03/28/16  Yes [provider]    Family History Family History  Problem Relation Age of Onset  . Cancer Mother        lung  . Cancer Father        prostate  . Colon cancer Neg  Hx   . Diabetes Neg Hx     Social History Social History  Substance Use Topics  . Smoking status: Former Smoker    Packs/day: 0.50    Years: 15.00    Quit date: 11/11/2002  . Smokeless tobacco: Never Used  . Alcohol use No     Allergies   Penicillins   Review of Systems Review of Systems  Cardiovascular: Positive for leg swelling.  All other systems reviewed and are negative.    Physical Exam Updated Vital Signs BP (!) 111/49   Pulse (!) 109   Temp 98.3 F (36.8 C) (Oral)   Resp (!) 36   Ht 1.816 m (5' 11.5")   Wt 121.6 kg (268 lb)   SpO2 91%   BMI 36.86 kg/m   Physical Exam  Constitutional: He appears well-developed and well-nourished. No distress.  HENT:  Head: Normocephalic and atraumatic.  Right Ear: External ear normal.  Left Ear: External ear normal.  Eyes: Conjunctivae are normal. Right eye exhibits no discharge. Left eye exhibits no discharge. No scleral icterus.  Neck: Neck supple. No tracheal deviation present.  Cardiovascular: Normal rate, regular rhythm and intact distal pulses.   Pulmonary/Chest: Effort normal and breath sounds normal. No stridor. Tachypnea noted. No respiratory distress. He has no wheezes. He has no rales.  Abdominal: Soft. Bowel sounds are normal. He exhibits no distension. There is tenderness in the epigastric area, left upper quadrant and left lower quadrant. There is no rebound and no guarding.  Musculoskeletal: He exhibits edema. He exhibits no tenderness.  Edema bilateral lower extrem   Neurological: He is alert. He has normal strength. No cranial nerve deficit (no facial droop, extraocular movements intact, no slurred speech) or sensory deficit. He exhibits normal muscle tone. He displays no seizure activity. Coordination normal.  Skin: Skin is warm and dry. No rash noted.  Psychiatric: He has a normal mood and affect.  Nursing note and vitals reviewed.    ED Treatments / Results  Labs (all labs ordered are listed,  but only abnormal results are displayed) Labs Reviewed  COMPREHENSIVE METABOLIC PANEL - Abnormal; Notable for the following:       Result Value   Sodium 133 (*)    Chloride 96 (*)    Glucose, Bld 231 (*)    BUN 23 (*)    Creatinine, Ser 1.79 (*)    GFR calc non Af Amer 38 (*)    GFR calc Af Amer 44 (*)    All other components within normal limits  LIPASE, BLOOD  CBC  BRAIN NATRIURETIC PEPTIDE  TROPONIN I  URINALYSIS, ROUTINE W REFLEX MICROSCOPIC  INFLUENZA PANEL BY PCR (TYPE A & B)    EKG  EKG Interpretation  Date/Time:  Tuesday May 18 2017 11:49:17 EDT Ventricular Rate:  113 PR Interval:  186 QRS Duration: 106 QT Interval:  326 QTC Calculation: 447 R Axis:   -26 Text Interpretation:  Sinus tachycardia with Premature atrial complexes with Abberant conduction Left ventricular hypertrophy with repolarization abnormality Cannot rule out Septal infarct , age undetermined Abnormal ECG inferior t wave changes resolved since last tracing Confirmed by Dorie Rank [phone]) on 05/18/2017 1:30:46 PM       Radiology Dg Chest 2 View  Result Date: 05/18/2017 CLINICAL DATA:  Shortness of breath, productive cough. EXAM: CHEST  2 VIEW COMPARISON:  Radiographs of December 01, 2012. FINDINGS: Stable cardiomediastinal silhouette. Atherosclerosis of thoracic aorta is noted. Mild central pulmonary vascular congestion is noted. Increased bibasilar atelectasis or edema is noted with mild bilateral pleural effusions. Status post surgical fixation of midthoracic spine. IMPRESSION: Aortic atherosclerosis. Mild central pulmonary vascular congestion. Increased bibasilar atelectasis or edema is noted with mild bilateral pleural effusions. Electronically Signed   By: Marijo Conception, M.D.   On: 05/18/2017 12:43   Ct Angio Chest Pe W And/or Wo Contrast  Result Date: 05/18/2017 CLINICAL DATA:  Shortness of breath today and cough. Abdomen pain. Suspect diverticulitis. EXAM: CT ANGIOGRAPHY CHEST CT ABDOMEN  AND PELVIS WITH CONTRAST TECHNIQUE: Multidetector CT imaging of the chest was performed using the standard protocol during bolus administration of intravenous contrast. Multiplanar CT image reconstructions and MIPs were obtained to evaluate the vascular anatomy. Multidetector CT imaging of the abdomen and pelvis was performed using the standard protocol during bolus administration of intravenous contrast. CONTRAST:  80 mL Isovue 370. COMPARISON:  None. FINDINGS: CTA CHEST FINDINGS Cardiovascular: Satisfactory opacification of the pulmonary arteries to the segmental level. No evidence of pulmonary embolism. The heart size is mildly enlarged. No pericardial effusion. Mediastinum/Nodes: No enlarged mediastinal, hilar, or axillary lymph nodes. Thyroid gland, trachea, and esophagus demonstrate no significant findings. Lungs/Pleura: There is a small left pleural effusion. Patchy consolidation of the left lower lobe, inferior left upper lobe, peripheral right lower lobe and to a lesser degree right middle lobe are noted. Musculoskeletal: Postsurgical changes of thoracic spine are noted. Review of the MIP images confirms the above findings. CT ABDOMEN and PELVIS FINDINGS Hepatobiliary: There is diffuse low density of the liver without vessel displacement. No focal liver lesion is identified. The gallbladder is normal. The biliary tree is normal. Pancreas: Unremarkable. No pancreatic ductal dilatation or surrounding inflammatory changes. Spleen: No focal or acute abnormality identified within the spleen. Adrenals/Urinary Tract: Adrenal glands are unremarkable. Kidneys are normal, without renal calculi, focal lesion, or hydronephrosis. Bladder is unremarkable. Stomach/Bowel: There is a small hiatal hernia. The stomach is otherwise normal. There is no small bowel obstruction. There is no evidence of diverticulitis. The appendix is not definitely seen but no inflammation is noted around cecum. Vascular/Lymphatic: Aortic  atherosclerosis. No enlarged abdominal or pelvic lymph nodes. Reproductive: Prostate is unremarkable. Other: There is mild umbilical herniation of mesenteric fat. Musculoskeletal: Degenerative joint changes of the spine are noted. Review of the MIP images confirms the above findings. IMPRESSION: No pulmonary embolus. Patchy consolidations of bilateral lungs suspicious for developing pneumonias. Small left pleural effusion. No evidence of diverticulitis. No acute abnormality identified in the abdomen and pelvis. Fatty infiltration of liver. Electronically Signed   By: Abelardo Diesel M.D.   On: 05/18/2017 16:26   Ct Abdomen Pelvis W Contrast  Result Date: 05/18/2017 CLINICAL DATA:  Shortness of breath today and cough. Abdomen pain. Suspect diverticulitis. EXAM: CT ANGIOGRAPHY CHEST CT ABDOMEN AND PELVIS WITH CONTRAST TECHNIQUE: Multidetector CT imaging of  the chest was performed using the standard protocol during bolus administration of intravenous contrast. Multiplanar CT image reconstructions and MIPs were obtained to evaluate the vascular anatomy. Multidetector CT imaging of the abdomen and pelvis was performed using the standard protocol during bolus administration of intravenous contrast. CONTRAST:  80 mL Isovue 370. COMPARISON:  None. FINDINGS: CTA CHEST FINDINGS Cardiovascular: Satisfactory opacification of the pulmonary arteries to the segmental level. No evidence of pulmonary embolism. The heart size is mildly enlarged. No pericardial effusion. Mediastinum/Nodes: No enlarged mediastinal, hilar, or axillary lymph nodes. Thyroid gland, trachea, and esophagus demonstrate no significant findings. Lungs/Pleura: There is a small left pleural effusion. Patchy consolidation of the left lower lobe, inferior left upper lobe, peripheral right lower lobe and to a lesser degree right middle lobe are noted. Musculoskeletal: Postsurgical changes of thoracic spine are noted. Review of the MIP images confirms the above  findings. CT ABDOMEN and PELVIS FINDINGS Hepatobiliary: There is diffuse low density of the liver without vessel displacement. No focal liver lesion is identified. The gallbladder is normal. The biliary tree is normal. Pancreas: Unremarkable. No pancreatic ductal dilatation or surrounding inflammatory changes. Spleen: No focal or acute abnormality identified within the spleen. Adrenals/Urinary Tract: Adrenal glands are unremarkable. Kidneys are normal, without renal calculi, focal lesion, or hydronephrosis. Bladder is unremarkable. Stomach/Bowel: There is a small hiatal hernia. The stomach is otherwise normal. There is no small bowel obstruction. There is no evidence of diverticulitis. The appendix is not definitely seen but no inflammation is noted around cecum. Vascular/Lymphatic: Aortic atherosclerosis. No enlarged abdominal or pelvic lymph nodes. Reproductive: Prostate is unremarkable. Other: There is mild umbilical herniation of mesenteric fat. Musculoskeletal: Degenerative joint changes of the spine are noted. Review of the MIP images confirms the above findings. IMPRESSION: No pulmonary embolus. Patchy consolidations of bilateral lungs suspicious for developing pneumonias. Small left pleural effusion. No evidence of diverticulitis. No acute abnormality identified in the abdomen and pelvis. Fatty infiltration of liver. Electronically Signed   By: Abelardo Diesel M.D.   On: 05/18/2017 16:26    Procedures Procedures (including critical care time)  Medications Ordered in ED Medications  cefTRIAXone (ROCEPHIN) 1 g in dextrose 5 % 50 mL IVPB (not administered)  azithromycin (ZITHROMAX) tablet 500 mg (not administered)  morphine 4 MG/ML injection 4 mg (4 mg Intravenous Given 05/18/17 1332)  iopamidol (ISOVUE-370) 76 % injection 80 mL (80 mLs Intravenous Contrast Given 05/18/17 1552)     Initial Impression / Assessment and Plan / ED Course  I have reviewed the triage vital signs and the nursing  notes.  Pertinent labs & imaging results that were available during my care of the patient were reviewed by me and considered in my medical decision making (see chart for details).   patient presented to the emergency room with complaints of cough, chest and abdominal pain.  HE was notably tachypnea And his oxygen saturation was lower than normal. Cardiac evaluation is reassuring. No evidence of congestive heart failure or cardiac ischemia. CT abdomen pelvis was performed. No evidence of any acute abdominal pathology. X-ray does suggest developing pneumonia.  I have ordered IV antibiotics. I will add on influenza screen. I will consult the medical service for admission.  Final Clinical Impressions(s) / ED Diagnoses   Final diagnoses:  Community acquired pneumonia, unspecified laterality       Dorie Rank, MD 05/18/17 1701

## 2017-05-18 NOTE — ED Notes (Signed)
Pt sob going from wheelchair to bed. sats stayed 99-100 %.  Pt placed on heart monitor.  Irregular rhythm with rate in the 120's.

## 2017-05-18 NOTE — ED Triage Notes (Signed)
Pt c/o left sided abdominal pain and trouble breathing that started today after coughing really hard. Pt reports non productive cough x several weeks. Denies fever. Pt states, "I feel as if I've pulled a stomach muscle".

## 2017-05-18 NOTE — ED Notes (Signed)
Report to Lysbeth Galas, RN ICU

## 2017-05-18 NOTE — ED Notes (Signed)
Pt placed on 2 liters [place].

## 2017-05-19 ENCOUNTER — Inpatient Hospital Stay (HOSPITAL_COMMUNITY): Payer: Medicare Other

## 2017-05-19 DIAGNOSIS — N179 Acute kidney failure, unspecified: Secondary | ICD-10-CM

## 2017-05-19 DIAGNOSIS — J9601 Acute respiratory failure with hypoxia: Secondary | ICD-10-CM

## 2017-05-19 DIAGNOSIS — J9600 Acute respiratory failure, unspecified whether with hypoxia or hypercapnia: Secondary | ICD-10-CM

## 2017-05-19 DIAGNOSIS — A419 Sepsis, unspecified organism: Secondary | ICD-10-CM

## 2017-05-19 DIAGNOSIS — I10 Essential (primary) hypertension: Secondary | ICD-10-CM

## 2017-05-19 DIAGNOSIS — J181 Lobar pneumonia, unspecified organism: Secondary | ICD-10-CM

## 2017-05-19 LAB — NA AND K (SODIUM & POTASSIUM), RAND UR: POTASSIUM UR: 61 mmol/L

## 2017-05-19 LAB — CBC WITH DIFFERENTIAL/PLATELET
BASOS ABS: 0 10*3/uL (ref 0.0–0.1)
Basophils Relative: 0 %
EOS ABS: 0 10*3/uL (ref 0.0–0.7)
Eosinophils Relative: 0 %
HEMATOCRIT: 38.3 % — AB (ref 39.0–52.0)
Hemoglobin: 13.1 g/dL (ref 13.0–17.0)
LYMPHS ABS: 0.7 10*3/uL (ref 0.7–4.0)
Lymphocytes Relative: 5 %
MCH: 32 pg (ref 26.0–34.0)
MCHC: 34.2 g/dL (ref 30.0–36.0)
MCV: 93.6 fL (ref 78.0–100.0)
MONOS PCT: 13 %
Monocytes Absolute: 1.8 10*3/uL — ABNORMAL HIGH (ref 0.1–1.0)
Neutro Abs: 11.5 10*3/uL — ABNORMAL HIGH (ref 1.7–7.7)
Neutrophils Relative %: 82 %
PLATELETS: 179 10*3/uL (ref 150–400)
RBC: 4.09 MIL/uL — AB (ref 4.22–5.81)
RDW: 13.1 % (ref 11.5–15.5)
WBC: 14 10*3/uL — AB (ref 4.0–10.5)

## 2017-05-19 LAB — LACTIC ACID, PLASMA
LACTIC ACID, VENOUS: 2.1 mmol/L — AB (ref 0.5–1.9)
Lactic Acid, Venous: 2.7 mmol/L (ref 0.5–1.9)

## 2017-05-19 LAB — BASIC METABOLIC PANEL
ANION GAP: 13 (ref 5–15)
BUN: 32 mg/dL — AB (ref 6–20)
CALCIUM: 8.6 mg/dL — AB (ref 8.9–10.3)
CO2: 23 mmol/L (ref 22–32)
Chloride: 98 mmol/L — ABNORMAL LOW (ref 101–111)
Creatinine, Ser: 2.32 mg/dL — ABNORMAL HIGH (ref 0.61–1.24)
GFR calc Af Amer: 32 mL/min — ABNORMAL LOW (ref >60)
GFR, EST NON AFRICAN AMERICAN: 28 mL/min — AB (ref >60)
Glucose, Bld: 349 mg/dL — ABNORMAL HIGH (ref 65–99)
POTASSIUM: 3.9 mmol/L (ref 3.5–5.1)
SODIUM: 134 mmol/L — AB (ref 135–145)

## 2017-05-19 LAB — GLUCOSE, CAPILLARY
GLUCOSE-CAPILLARY: 214 mg/dL — AB (ref 65–99)
GLUCOSE-CAPILLARY: 158 mg/dL — AB (ref 65–99)
Glucose-Capillary: 340 mg/dL — ABNORMAL HIGH (ref 65–99)
Glucose-Capillary: 324 mg/dL — ABNORMAL HIGH (ref 65–99)

## 2017-05-19 LAB — MRSA PCR SCREENING: MRSA by PCR: NEGATIVE

## 2017-05-19 LAB — STREP PNEUMONIAE URINARY ANTIGEN: STREP PNEUMO URINARY ANTIGEN: NEGATIVE

## 2017-05-19 LAB — TROPONIN I
Troponin I: <0.03 ng/mL (ref <0.03)
Troponin I: <0.03 ng/mL (ref <0.03)

## 2017-05-19 LAB — CK: Total CK: 984 U/L — ABNORMAL HIGH (ref 49–397)

## 2017-05-19 MED ORDER — SODIUM CHLORIDE 0.9 % IV BOLUS (SEPSIS)
1000.0000 mL | Freq: Once | INTRAVENOUS | Status: AC
Start: 2017-05-19 — End: 2017-05-19
  Administered 2017-05-19: 1000 mL via INTRAVENOUS

## 2017-05-19 NOTE — Care Management Note (Signed)
Case Management Note  Patient Details  Name: [name] MRN: [id number] Date of Birth: 02/10/1951  Subjective/Objective: Adm with sepsis due to pneumonia. From home, lives with son. Ind with ADL's. No home health or DME prior to admission. Has PCP, still drives to appointments. Reports no issues affording medications. Acutely using oxygen.                    Action/Plan: Anticipate DC home with self care. Anticipate wean to room air. CM will follow for needs.  Expected Discharge Date:     05/21/2017             Expected Discharge Plan:  Home/Self Care  In-House Referral:     Discharge planning Services  CM Consult  Post Acute Care Choice:    Choice offered to:  NA  DME Arranged:    DME Agency:     HH Arranged:    HH Agency:     Status of Service:  In process, will continue to follow  If discussed at Long Length of Stay Meetings, dates discussed:    Additional Comments:  Beulah Capobianco, Chauncey Reading, RN 05/19/2017, 2:01 PM

## 2017-05-19 NOTE — ED Notes (Signed)
Pt sitting on end of bed, urinal given

## 2017-05-19 NOTE — Progress Notes (Signed)
Inpatient Diabetes Program Recommendations  AACE/ADA: New Consensus Statement on Inpatient Glycemic Control (2015)  Target Ranges:  Prepandial:   less than 140 mg/dL      Peak postprandial:   less than 180 mg/dL (1-2 hours)      Critically ill patients:  140 - 180 mg/dL  Results for BJORN, HALLAS (MRN [id number]) as of 05/19/2017 11:50  Ref. Range 05/18/2017 22:22 05/19/2017 07:46  Glucose-Capillary Latest Ref Range: 65 - 99 mg/dL 250 (H) 340 (H)   Results for [name], SHADWICK (MRN [id number]) as of 05/19/2017 11:50  Ref. Range 02/19/2017 13:45  Hemoglobin A1C Unknown 6.2   Review of Glycemic Control  Diabetes history: DM2 Outpatient Diabetes medications: Amaryl 0.5 mg QAM Current orders for Inpatient glycemic control: Novolog 0-15 units TID with meals, Novolog 0-5 units QHS  Inpatient Diabetes Program Recommendations: Insulin - Basal: If steroids are continued, please consider ordering Lantus 10 units Q24H. HgbA1C: Please consider ordering an A1C to evaluate glycemic control over the past 2-3 months.  Thanks, Barnie Alderman, RN, MSN, CDE Diabetes Coordinator Inpatient Diabetes Program [phone] (Team Pager from 8am to 5pm)

## 2017-05-19 NOTE — ED Notes (Signed)
Pt slid off end of bed into floor, upon entering room pt sitting on floor, pt denies hitting head, pain and injuries, assisted pt up and into bed

## 2017-05-19 NOTE — Progress Notes (Signed)
PROGRESS NOTE  [name] ENI:[id number] DOB: [birth year] DOA: 05/18/2017 PCP: [employer] [employer], MD  Brief History:  66 year old male with history of large B cell lymphoma status post stem cell transplant 2007, essential hypertension, hyperlipidemia, diabetes mellitus presented with shortness of breath that began on 05/14/2017. The patient has also been complaining of worsening cough with clear sputum production at that time. He denied any fevers, chills, headache, neck pain, nausea, vomiting, diarrhea, dysuria, hematuria. He complained of left sided chest pain radiating to his left upper quadrant. Upon admission, CT angio of the chest was performed was negative for pulmonary embolus but showed LLL consolidation with small left pleural effusion. There is also consolidation of RLL. CT of abdomen was negative for any acute findings. The patient was started on azithromycin and ceftriaxone.  Assessment/Plan: Sepsis -Secondary to pneumonia -Continue ceftriaxone and azithromycin -Continue IV fluids -Lactic acid peaked to 2.1 -Procalcitonin 27.0  Lobar Pneumonia -05/18/17 CTA chest--neg for PE, showed LLL consolidation with small left pleural effusion. There is also consolidation of RLL -Continue ceftriaxone and azithromycin  Acute respiratory failure with hypoxia -Secondary to pneumonia -Presently stable on 3 L nasal cannula -wean oxygen for saturation greater than 92% -continue bronchodilators  AKI -Secondary to sepsis -Baseline creatinine 1.0-1.3 -Anticipate a serum creatinine to plateau in next 24 hours -renal US -AM BMP  Atypical chest pain -likely due to PNA -finish cycling troponins -personally reviewed EKG--sinus tachycardia, nonspecific T-wave changes -personally reviewed CXR--LLL opacity, bilateral pleural effusions  Essential hypertension -Holding lisinopril and HCTZ  Diabetes mellitus type 2, controlled -02/19/2017 hemoglobin A1c 6.2 -NovoLog sliding  scale -Holding Amaryl  Hyperlipidemia -Continue statin   Disposition Plan:   Home in 2-3 days  Family Communication:   No Family at bedside  Consultants:  none  Code Status:  FULL  DVT Prophylaxis: [place] Lovenox   Procedures: As Listed in Progress Note Above  Antibiotics: Ceftriaxone 9/25>>> Azithromycin 9/25>>>    Subjective: Patient feels that he is breathing better than yesterday. He still has a nonproductive cough. Denies any hemoptysis. Denies any fevers, chills, headache, neck pain, nausea, vomiting, diarrhea, abdominal pain, dysuria, hematuria. He has some LUQ pain with coughing.  Objective: Vitals:   05/19/17 0600 05/19/17 0700 05/19/17 0800 05/19/17 0826  BP: (!) 109/55 102/61 105/62   Pulse: 80 92 94   Resp: (!) 25 (!) 32 (!) 35   Temp: 98.7 F (37.1 C)     TempSrc: Oral     SpO2: 95% 95% 95% 95%  Weight: 125 kg (275 lb 9.2 oz)     Height:        Intake/Output Summary (Last 24 hours) at 05/19/17 1002 Last data filed at 05/19/17 0800  Gross per 24 hour  Intake          1193.75 ml  Output              350 ml  Net           843.75 ml   Weight change:  Exam:   General:  Pt is alert, follows commands appropriately, not in acute distress  HEENT: No icterus, No thrush, No neck mass, Pelzer/AT  Cardiovascular: RRR, S1/S2, no rubs, no gallops  Respiratory: Bibasilar crackles with diminished breath sounds left greater than right. No wheezing.  Abdomen: Soft/+BS, non tender, non distended, no guarding  Extremities: No edema, No lymphangitis, No petechiae, No rashes, no synovitis   Data Reviewed: I have personally  reviewed following labs and imaging studies Basic Metabolic Panel:  Recent Labs Lab 05/18/17 1339 05/19/17 0444  NA 133* 134*  K 3.7 3.9  CL 96* 98*  CO2 25 23  GLUCOSE 231* 349*  BUN 23* 32*  CREATININE 1.79* 2.32*  CALCIUM 9.1 8.6*   Liver Function Tests:  Recent Labs Lab 05/18/17 1339  AST 18  ALT 21  ALKPHOS 60  BILITOT  1.1  PROT 7.8  ALBUMIN 3.8    Recent Labs Lab 05/18/17 1339  LIPASE 21   No results for input(s): AMMONIA in the last 168 hours. Coagulation Profile:  Recent Labs Lab 05/18/17 2209  INR 1.24   CBC:  Recent Labs Lab 05/18/17 1339 05/19/17 0444  WBC 9.8 14.0*  NEUTROABS  --  11.5*  HGB 14.4 13.1  HCT 41.3 38.3*  MCV 92.6 93.6  PLT 191 179   Cardiac Enzymes:  Recent Labs Lab 05/18/17 1339  TROPONINI <0.03   BNP: Invalid input(s): POCBNP CBG:  Recent Labs Lab 05/18/17 2222 05/19/17 0746  GLUCAP 250* 340*   HbA1C: No results for input(s): HGBA1C in the last 72 hours. Urine analysis:    Component Value Date/Time   COLORURINE YELLOW 01/01/2016 1740   APPEARANCEUR CLEAR 01/01/2016 1740   LABSPEC >1.030 (H) 01/01/2016 1740   PHURINE 5.5 01/01/2016 1740   GLUCOSEU 500 (A) 01/01/2016 1740   HGBUR NEGATIVE 01/01/2016 1740   BILIRUBINUR MODERATE (A) 01/01/2016 1740   KETONESUR 15 (A) 01/01/2016 1740   PROTEINUR 100 (A) 01/01/2016 1740   NITRITE NEGATIVE 01/01/2016 1740   LEUKOCYTESUR NEGATIVE 01/01/2016 1740   Sepsis Labs: @LABRCNTIP (procalcitonin:4,lacticidven:4) ) Recent Results (from the past 240 hour(s))  MRSA PCR Screening     Status: None   Collection Time: 05/18/17  9:32 PM  Result Value Ref Range Status   MRSA by PCR NEGATIVE NEGATIVE Final    Comment:        The GeneXpert MRSA Assay (FDA approved for NASAL specimens only), is one component of a comprehensive MRSA colonization surveillance program. It is not intended to diagnose MRSA infection nor to guide or monitor treatment for MRSA infections.   Culture, blood (routine x 2) Call MD if unable to obtain prior to antibiotics being given     Status: None (Preliminary result)   Collection Time: 05/18/17  9:43 PM  Result Value Ref Range Status   Specimen Description RIGHT ANTECUBITAL  Final   Special Requests   Final    BOTTLES DRAWN AEROBIC AND ANAEROBIC Blood Culture results may not  be optimal due to an inadequate volume of blood received in culture bottles   Culture NO GROWTH < 12 HOURS  Final   Report Status PENDING  Incomplete  Culture, blood (routine x 2) Call MD if unable to obtain prior to antibiotics being given     Status: None (Preliminary result)   Collection Time: 05/18/17  9:43 PM  Result Value Ref Range Status   Specimen Description BLOOD LEFT HAND  Final   Special Requests   Final    BOTTLES DRAWN AEROBIC ONLY Blood Culture results may not be optimal due to an inadequate volume of blood received in culture bottles   Culture NO GROWTH < 12 HOURS  Final   Report Status PENDING  Incomplete     Scheduled Meds: . enoxaparin (LOVENOX) injection  40 mg Subcutaneous Q24H  . insulin aspart  0-15 Units Subcutaneous TID WC  . insulin aspart  0-5 Units Subcutaneous QHS  . ipratropium-albuterol  3 mL Nebulization TID  . predniSONE  40 mg Oral Q breakfast  . simvastatin  20 mg Oral q1800   Continuous Infusions: . sodium chloride 75 mL/hr at 05/19/17 0801  . azithromycin Stopped (05/19/17 0125)  . cefTRIAXone (ROCEPHIN)  IV      Procedures/Studies: Dg Chest 2 View  Result Date: 05/18/2017 CLINICAL DATA:  Shortness of breath, productive cough. EXAM: CHEST  2 VIEW COMPARISON:  Radiographs of December 01, 2012. FINDINGS: Stable cardiomediastinal silhouette. Atherosclerosis of thoracic aorta is noted. Mild central pulmonary vascular congestion is noted. Increased bibasilar atelectasis or edema is noted with mild bilateral pleural effusions. Status post surgical fixation of midthoracic spine. IMPRESSION: Aortic atherosclerosis. Mild central pulmonary vascular congestion. Increased bibasilar atelectasis or edema is noted with mild bilateral pleural effusions. Electronically Signed   By: Marijo Conception, M.D.   On: 05/18/2017 12:43   Ct Angio Chest Pe W And/or Wo Contrast  Result Date: 05/18/2017 CLINICAL DATA:  Shortness of breath today and cough. Abdomen pain. Suspect  diverticulitis. EXAM: CT ANGIOGRAPHY CHEST CT ABDOMEN AND PELVIS WITH CONTRAST TECHNIQUE: Multidetector CT imaging of the chest was performed using the standard protocol during bolus administration of intravenous contrast. Multiplanar CT image reconstructions and MIPs were obtained to evaluate the vascular anatomy. Multidetector CT imaging of the abdomen and pelvis was performed using the standard protocol during bolus administration of intravenous contrast. CONTRAST:  80 mL Isovue 370. COMPARISON:  None. FINDINGS: CTA CHEST FINDINGS Cardiovascular: Satisfactory opacification of the pulmonary arteries to the segmental level. No evidence of pulmonary embolism. The heart size is mildly enlarged. No pericardial effusion. Mediastinum/Nodes: No enlarged mediastinal, hilar, or axillary lymph nodes. Thyroid gland, trachea, and esophagus demonstrate no significant findings. Lungs/Pleura: There is a small left pleural effusion. Patchy consolidation of the left lower lobe, inferior left upper lobe, peripheral right lower lobe and to a lesser degree right middle lobe are noted. Musculoskeletal: Postsurgical changes of thoracic spine are noted. Review of the MIP images confirms the above findings. CT ABDOMEN and PELVIS FINDINGS Hepatobiliary: There is diffuse low density of the liver without vessel displacement. No focal liver lesion is identified. The gallbladder is normal. The biliary tree is normal. Pancreas: Unremarkable. No pancreatic ductal dilatation or surrounding inflammatory changes. Spleen: No focal or acute abnormality identified within the spleen. Adrenals/Urinary Tract: Adrenal glands are unremarkable. Kidneys are normal, without renal calculi, focal lesion, or hydronephrosis. Bladder is unremarkable. Stomach/Bowel: There is a small hiatal hernia. The stomach is otherwise normal. There is no small bowel obstruction. There is no evidence of diverticulitis. The appendix is not definitely seen but no inflammation is  noted around cecum. Vascular/Lymphatic: Aortic atherosclerosis. No enlarged abdominal or pelvic lymph nodes. Reproductive: Prostate is unremarkable. Other: There is mild umbilical herniation of mesenteric fat. Musculoskeletal: Degenerative joint changes of the spine are noted. Review of the MIP images confirms the above findings. IMPRESSION: No pulmonary embolus. Patchy consolidations of bilateral lungs suspicious for developing pneumonias. Small left pleural effusion. No evidence of diverticulitis. No acute abnormality identified in the abdomen and pelvis. Fatty infiltration of liver. Electronically Signed   By: Abelardo Diesel M.D.   On: 05/18/2017 16:26   Ct Abdomen Pelvis W Contrast  Result Date: 05/18/2017 CLINICAL DATA:  Shortness of breath today and cough. Abdomen pain. Suspect diverticulitis. EXAM: CT ANGIOGRAPHY CHEST CT ABDOMEN AND PELVIS WITH CONTRAST TECHNIQUE: Multidetector CT imaging of the chest was performed using the standard protocol during bolus administration of intravenous contrast. Multiplanar CT image  reconstructions and MIPs were obtained to evaluate the vascular anatomy. Multidetector CT imaging of the abdomen and pelvis was performed using the standard protocol during bolus administration of intravenous contrast. CONTRAST:  80 mL Isovue 370. COMPARISON:  None. FINDINGS: CTA CHEST FINDINGS Cardiovascular: Satisfactory opacification of the pulmonary arteries to the segmental level. No evidence of pulmonary embolism. The heart size is mildly enlarged. No pericardial effusion. Mediastinum/Nodes: No enlarged mediastinal, hilar, or axillary lymph nodes. Thyroid gland, trachea, and esophagus demonstrate no significant findings. Lungs/Pleura: There is a small left pleural effusion. Patchy consolidation of the left lower lobe, inferior left upper lobe, peripheral right lower lobe and to a lesser degree right middle lobe are noted. Musculoskeletal: Postsurgical changes of thoracic spine are noted.  Review of the MIP images confirms the above findings. CT ABDOMEN and PELVIS FINDINGS Hepatobiliary: There is diffuse low density of the liver without vessel displacement. No focal liver lesion is identified. The gallbladder is normal. The biliary tree is normal. Pancreas: Unremarkable. No pancreatic ductal dilatation or surrounding inflammatory changes. Spleen: No focal or acute abnormality identified within the spleen. Adrenals/Urinary Tract: Adrenal glands are unremarkable. Kidneys are normal, without renal calculi, focal lesion, or hydronephrosis. Bladder is unremarkable. Stomach/Bowel: There is a small hiatal hernia. The stomach is otherwise normal. There is no small bowel obstruction. There is no evidence of diverticulitis. The appendix is not definitely seen but no inflammation is noted around cecum. Vascular/Lymphatic: Aortic atherosclerosis. No enlarged abdominal or pelvic lymph nodes. Reproductive: Prostate is unremarkable. Other: There is mild umbilical herniation of mesenteric fat. Musculoskeletal: Degenerative joint changes of the spine are noted. Review of the MIP images confirms the above findings. IMPRESSION: No pulmonary embolus. Patchy consolidations of bilateral lungs suspicious for developing pneumonias. Small left pleural effusion. No evidence of diverticulitis. No acute abnormality identified in the abdomen and pelvis. Fatty infiltration of liver. Electronically Signed   By: Abelardo Diesel M.D.   On: 05/18/2017 16:26    Irasema Chalk, DO  [facility] Pager [phone]  If 7PM-7AM, please contact night-coverage [email or web address] Password [email or web address] 05/19/2017, 10:02 AM   LOS: 1 day

## 2017-05-19 NOTE — Progress Notes (Signed)
CRITICAL VALUE ALERT  Critical Value:  Lactic 2.1  Date & Time Notied:  05/19/2017 0135  Provider Notified: MD Darrick Meigs

## 2017-05-20 DIAGNOSIS — J189 Pneumonia, unspecified organism: Secondary | ICD-10-CM

## 2017-05-20 LAB — BASIC METABOLIC PANEL
Anion gap: 11 (ref 5–15)
BUN: 26 mg/dL — AB (ref 6–20)
CO2: 24 mmol/L (ref 22–32)
CREATININE: 1.34 mg/dL — AB (ref 0.61–1.24)
Calcium: 8.5 mg/dL — ABNORMAL LOW (ref 8.9–10.3)
Chloride: 102 mmol/L (ref 101–111)
GFR calc Af Amer: >60 mL/min (ref >60)
GFR calc non Af Amer: 54 mL/min — ABNORMAL LOW (ref >60)
GLUCOSE: 209 mg/dL — AB (ref 65–99)
Potassium: 3.5 mmol/L (ref 3.5–5.1)
Sodium: 137 mmol/L (ref 135–145)

## 2017-05-20 LAB — CBC
HCT: 36.5 % — ABNORMAL LOW (ref 39.0–52.0)
Hemoglobin: 12.6 g/dL — ABNORMAL LOW (ref 13.0–17.0)
MCH: 31.9 pg (ref 26.0–34.0)
MCHC: 34.5 g/dL (ref 30.0–36.0)
MCV: 92.4 fL (ref 78.0–100.0)
PLATELETS: 190 10*3/uL (ref 150–400)
RBC: 3.95 MIL/uL — ABNORMAL LOW (ref 4.22–5.81)
RDW: 13.4 % (ref 11.5–15.5)
WBC: 11 10*3/uL — ABNORMAL HIGH (ref 4.0–10.5)

## 2017-05-20 LAB — HIV ANTIBODY (ROUTINE TESTING W REFLEX): HIV SCREEN 4TH GENERATION: NONREACTIVE

## 2017-05-20 LAB — GLUCOSE, CAPILLARY
GLUCOSE-CAPILLARY: 252 mg/dL — AB (ref 65–99)
Glucose-Capillary: 209 mg/dL — ABNORMAL HIGH (ref 65–99)
Glucose-Capillary: 263 mg/dL — ABNORMAL HIGH (ref 65–99)
Glucose-Capillary: 272 mg/dL — ABNORMAL HIGH (ref 65–99)

## 2017-05-20 LAB — LACTIC ACID, PLASMA: LACTIC ACID, VENOUS: 1.3 mmol/L (ref 0.5–1.9)

## 2017-05-20 MED ORDER — AZITHROMYCIN 250 MG PO TABS
500.0000 mg | ORAL_TABLET | Freq: Every day | ORAL | Status: DC
  Administered 2017-05-20 – 2017-05-21 (×2): 500 mg via ORAL
  Filled 2017-05-20 (×2): qty 2

## 2017-05-20 MED ORDER — INSULIN GLARGINE 100 UNIT/ML ~~LOC~~ SOLN
SUBCUTANEOUS | Status: AC
  Filled 2017-05-20: qty 10

## 2017-05-20 MED ORDER — INSULIN GLARGINE 100 UNIT/ML ~~LOC~~ SOLN
5.0000 [IU] | Freq: Every day | SUBCUTANEOUS | Status: DC
  Administered 2017-05-20 – 2017-05-21 (×2): 5 [IU] via SUBCUTANEOUS
  Filled 2017-05-20 (×4): qty 0.05

## 2017-05-20 NOTE — Progress Notes (Signed)
Inpatient Diabetes Program Recommendations  AACE/ADA: New Consensus Statement on Inpatient Glycemic Control (2015)  Target Ranges:  Prepandial:   less than 140 mg/dL      Peak postprandial:   less than 180 mg/dL (1-2 hours)      Critically ill patients:  140 - 180 mg/dL   Results for KHAMANI, FAIRLEY (MRN [id number]) as of 05/20/2017 09:46  Ref. Range 05/19/2017 07:46 05/19/2017 11:45 05/19/2017 16:53 05/19/2017 21:16 05/20/2017 07:49  Glucose-Capillary Latest Ref Range: 65 - 99 mg/dL 340 (H) 324 (H) 214 (H) 158 (H) 209 (H)   Review of Glycemic Control  Diabetes history: DM2 Outpatient Diabetes medications: Amaryl 0.5 mg QAM Current orders for Inpatient glycemic control: Novolog 0-15 units TID with meals, Novolog 0-5 units QHS  Inpatient Diabetes Program Recommendations: Insulin - Basal: If steroids are continued, please consider ordering Lantus 10 units Q24H. HgbA1C: Please consider ordering an A1C to evaluate glycemic control over the past 2-3 months.  Thanks, Barnie Alderman, RN, MSN, CDE Diabetes Coordinator Inpatient Diabetes Program [phone] (Team Pager from 8am to 5pm)

## 2017-05-20 NOTE — Progress Notes (Signed)
Pt had runs of vtach, MD made aware, vitals stable, will continue to monitor.

## 2017-05-20 NOTE — Progress Notes (Signed)
PROGRESS NOTE  [name] OVF:[id number] DOB: 1950/09/11 DOA: 05/18/2017 PCP: [place]mond School, MD  Brief History:  66 year old male with history of large B cell lymphoma status post stem cell transplant 2007, essential hypertension, hyperlipidemia, diabetes mellitus presented with shortness of breath that began on 05/14/2017. The patient has also been complaining of worsening cough with clear sputum production at that time. He denied any fevers, chills, headache, neck pain, nausea, vomiting, diarrhea, dysuria, hematuria. He complained of left sided chest pain radiating to his left upper quadrant. Upon admission, CT angio of the chest was performed was negative for pulmonary embolus but showed LLL consolidation with small left pleural effusion. There is also consolidation of RLL. CT of abdomen was negative for any acute findings. The patient was started on azithromycin and ceftriaxone.  Assessment/Plan: Sepsis -Secondary to pneumonia -Continue ceftriaxone and azithromycin -Continue IV fluids -Lactic acid peaked to 2.7>>>1.3 -Procalcitonin 27.0  Lobar Pneumonia -05/18/17 CTA chest--neg for PE, showed LLL consolidation with small left pleural effusion. There is also consolidation of RLL -Continue ceftriaxone and azithromycin -d/c prednisone  Acute respiratory failure with hypoxia -Secondary to pneumonia -Presently stable on 2 L nasal cannula -wean oxygen for saturation greater than 92% -continue bronchodilators  AKI -Secondary to sepsis -Baseline creatinine 1.0-1.3 -Anticipate a serum creatinine to plateau in next 24 hours -renal US--neg for hydronephrosis -AM BMP  Atypical chest pain -likely due to PNA -troponins neg x 3 -personally reviewed EKG--sinus tachycardia, nonspecific T-wave changes -personally reviewed CXR--LLL opacity, bilateral pleural effusions  Essential hypertension -Holding lisinopril and HCTZ -BP remains acceptable  Diabetes mellitus type  2, controlled -02/19/2017 hemoglobin A1c 6.2 -NovoLog sliding scale -Holding Amaryl -CBGs elevated due to steroids -add lantus 5 units -anticipate improvement now with d/c steroids  Hyperlipidemia -holding statin due to elevated CK -CK 989 -am CK  Rhabdomyolysis -mild -d/c statin for now   Disposition Plan:   Home vs SNF 9/28 or 9/29 Family Communication:   No Family at bedside  Consultants:  none  Code Status:  FULL  DVT Prophylaxis: [place] Lovenox   Procedures: As Listed in Progress Note Above  Antibiotics: Ceftriaxone 9/25>>> Azithromycin 9/25>>>   Subjective: Overall, the patient is breathing better. He still has some dyspnea on exertion. He denies any fevers, chills, chest pain, nausea, vomiting, diarrhea, abdominal pain. He still has his left upper quadrant pain. He has a nonproductive cough.  Objective: Vitals:   05/20/17 1400 05/20/17 1500 05/20/17 1600 05/20/17 1652  BP: (!) 142/65 (!) 141/79 134/76   Pulse: 93 94 93   Resp: (!) 33 (!) 23 (!) 28   Temp:   98.8 F (37.1 C)   TempSrc:   Oral   SpO2: 93% 92% 95% 96%  Weight:      Height:        Intake/Output Summary (Last 24 hours) at 05/20/17 1720 Last data filed at 05/20/17 1500  Gross per 24 hour  Intake             1975 ml  Output             1150 ml  Net              825 ml   Weight change: 3.836 kg (8 lb 7.3 oz) Exam:   General:  Pt is alert, follows commands appropriately, not in acute distress  HEENT: No icterus, No thrush, No neck mass, [place]/AT  Cardiovascular: RRR, S1/S2, no rubs, no  gallops  Respiratory: Bibasilar crackles. Diminished breath sounds left base. No wheezing.  Abdomen: Soft/+BS, LUQ tender, non distended, no guarding  Extremities: No edema, No lymphangitis, No petechiae, No rashes, no synovitis   Data Reviewed: I have personally reviewed following labs and imaging studies Basic Metabolic Panel:  Recent Labs Lab 05/18/17 1339 05/19/17 0444  05/20/17 0410  NA 133* 134* 137  K 3.7 3.9 3.5  CL 96* 98* 102  CO2 25 23 24   GLUCOSE 231* 349* 209*  BUN 23* 32* 26*  CREATININE 1.79* 2.32* 1.34*  CALCIUM 9.1 8.6* 8.5*   Liver Function Tests:  Recent Labs Lab 05/18/17 1339  AST 18  ALT 21  ALKPHOS 60  BILITOT 1.1  PROT 7.8  ALBUMIN 3.8    Recent Labs Lab 05/18/17 1339  LIPASE 21   No results for input(s): AMMONIA in the last 168 hours. Coagulation Profile:  Recent Labs Lab 05/18/17 2209  INR 1.24   CBC:  Recent Labs Lab 05/18/17 1339 05/19/17 0444 05/20/17 0410  WBC 9.8 14.0* 11.0*  NEUTROABS  --  11.5*  --   HGB 14.4 13.1 12.6*  HCT 41.3 38.3* 36.5*  MCV 92.6 93.6 92.4  PLT 191 179 190   Cardiac Enzymes:  Recent Labs Lab 05/18/17 1339 05/19/17 1040 05/19/17 1616  CKTOTAL  --  984*  --   TROPONINI <0.03 <0.03 <0.03   BNP: Invalid input(s): POCBNP CBG:  Recent Labs Lab 05/19/17 1653 05/19/17 2116 05/20/17 0749 05/20/17 1129 05/20/17 1608  GLUCAP 214* 158* 209* 263* 272*   HbA1C: No results for input(s): HGBA1C in the last 72 hours. Urine analysis:    Component Value Date/Time   COLORURINE YELLOW 01/01/2016 1740   APPEARANCEUR CLEAR 01/01/2016 1740   LABSPEC >1.030 (H) 01/01/2016 1740   PHURINE 5.5 01/01/2016 1740   GLUCOSEU 500 (A) 01/01/2016 1740   HGBUR NEGATIVE 01/01/2016 1740   BILIRUBINUR MODERATE (A) 01/01/2016 1740   KETONESUR 15 (A) 01/01/2016 1740   PROTEINUR 100 (A) 01/01/2016 1740   NITRITE NEGATIVE 01/01/2016 1740   LEUKOCYTESUR NEGATIVE 01/01/2016 1740   Sepsis Labs: @LABRCNTIP (procalcitonin:4,lacticidven:4) ) Recent Results (from the past 240 hour(s))  MRSA PCR Screening     Status: None   Collection Time: 05/18/17  9:32 PM  Result Value Ref Range Status   MRSA by PCR NEGATIVE NEGATIVE Final    Comment:        The GeneXpert MRSA Assay (FDA approved for NASAL specimens only), is one component of a comprehensive MRSA colonization surveillance  program. It is not intended to diagnose MRSA infection nor to guide or monitor treatment for MRSA infections.   Culture, blood (routine x 2) Call MD if unable to obtain prior to antibiotics being given     Status: None (Preliminary result)   Collection Time: 05/18/17  9:43 PM  Result Value Ref Range Status   Specimen Description RIGHT ANTECUBITAL  Final   Special Requests   Final    BOTTLES DRAWN AEROBIC AND ANAEROBIC Blood Culture results may not be optimal due to an inadequate volume of blood received in culture bottles   Culture NO GROWTH 2 DAYS  Final   Report Status PENDING  Incomplete  Culture, blood (routine x 2) Call MD if unable to obtain prior to antibiotics being given     Status: None (Preliminary result)   Collection Time: 05/18/17  9:43 PM  Result Value Ref Range Status   Specimen Description BLOOD LEFT HAND  Final   Special  Requests   Final    BOTTLES DRAWN AEROBIC ONLY Blood Culture results may not be optimal due to an inadequate volume of blood received in culture bottles   Culture NO GROWTH 2 DAYS  Final   Report Status PENDING  Incomplete     Scheduled Meds: . enoxaparin (LOVENOX) injection  40 mg Subcutaneous Q24H  . insulin aspart  0-15 Units Subcutaneous TID WC  . insulin aspart  0-5 Units Subcutaneous QHS  . ipratropium-albuterol  3 mL Nebulization TID   Continuous Infusions: . sodium chloride 75 mL/hr at 05/20/17 1630  . azithromycin Stopped (05/20/17 0041)  . cefTRIAXone (ROCEPHIN)  IV 1 g (05/20/17 1630)    Procedures/Studies: Dg Chest 2 View  Result Date: 05/18/2017 CLINICAL DATA:  Shortness of breath, productive cough. EXAM: CHEST  2 VIEW COMPARISON:  Radiographs of December 01, 2012. FINDINGS: Stable cardiomediastinal silhouette. Atherosclerosis of thoracic aorta is noted. Mild central pulmonary vascular congestion is noted. Increased bibasilar atelectasis or edema is noted with mild bilateral pleural effusions. Status post surgical fixation of  midthoracic spine. IMPRESSION: Aortic atherosclerosis. Mild central pulmonary vascular congestion. Increased bibasilar atelectasis or edema is noted with mild bilateral pleural effusions. Electronically Signed   By: Marijo Conception, M.D.   On: 05/18/2017 12:43   Ct Angio Chest Pe W And/or Wo Contrast  Result Date: 05/18/2017 CLINICAL DATA:  Shortness of breath today and cough. Abdomen pain. Suspect diverticulitis. EXAM: CT ANGIOGRAPHY CHEST CT ABDOMEN AND PELVIS WITH CONTRAST TECHNIQUE: Multidetector CT imaging of the chest was performed using the standard protocol during bolus administration of intravenous contrast. Multiplanar CT image reconstructions and MIPs were obtained to evaluate the vascular anatomy. Multidetector CT imaging of the abdomen and pelvis was performed using the standard protocol during bolus administration of intravenous contrast. CONTRAST:  80 mL Isovue 370. COMPARISON:  None. FINDINGS: CTA CHEST FINDINGS Cardiovascular: Satisfactory opacification of the pulmonary arteries to the segmental level. No evidence of pulmonary embolism. The heart size is mildly enlarged. No pericardial effusion. Mediastinum/Nodes: No enlarged mediastinal, hilar, or axillary lymph nodes. Thyroid gland, trachea, and esophagus demonstrate no significant findings. Lungs/Pleura: There is a small left pleural effusion. Patchy consolidation of the left lower lobe, inferior left upper lobe, peripheral right lower lobe and to a lesser degree right middle lobe are noted. Musculoskeletal: Postsurgical changes of thoracic spine are noted. Review of the MIP images confirms the above findings. CT ABDOMEN and PELVIS FINDINGS Hepatobiliary: There is diffuse low density of the liver without vessel displacement. No focal liver lesion is identified. The gallbladder is normal. The biliary tree is normal. Pancreas: Unremarkable. No pancreatic ductal dilatation or surrounding inflammatory changes. Spleen: No focal or acute  abnormality identified within the spleen. Adrenals/Urinary Tract: Adrenal glands are unremarkable. Kidneys are normal, without renal calculi, focal lesion, or hydronephrosis. Bladder is unremarkable. Stomach/Bowel: There is a small hiatal hernia. The stomach is otherwise normal. There is no small bowel obstruction. There is no evidence of diverticulitis. The appendix is not definitely seen but no inflammation is noted around cecum. Vascular/Lymphatic: Aortic atherosclerosis. No enlarged abdominal or pelvic lymph nodes. Reproductive: Prostate is unremarkable. Other: There is mild umbilical herniation of mesenteric fat. Musculoskeletal: Degenerative joint changes of the spine are noted. Review of the MIP images confirms the above findings. IMPRESSION: No pulmonary embolus. Patchy consolidations of bilateral lungs suspicious for developing pneumonias. Small left pleural effusion. No evidence of diverticulitis. No acute abnormality identified in the abdomen and pelvis. Fatty infiltration of liver. Electronically Signed  By: Abelardo Diesel M.D.   On: 05/18/2017 16:26   Ct Abdomen Pelvis W Contrast  Result Date: 05/18/2017 CLINICAL DATA:  Shortness of breath today and cough. Abdomen pain. Suspect diverticulitis. EXAM: CT ANGIOGRAPHY CHEST CT ABDOMEN AND PELVIS WITH CONTRAST TECHNIQUE: Multidetector CT imaging of the chest was performed using the standard protocol during bolus administration of intravenous contrast. Multiplanar CT image reconstructions and MIPs were obtained to evaluate the vascular anatomy. Multidetector CT imaging of the abdomen and pelvis was performed using the standard protocol during bolus administration of intravenous contrast. CONTRAST:  80 mL Isovue 370. COMPARISON:  None. FINDINGS: CTA CHEST FINDINGS Cardiovascular: Satisfactory opacification of the pulmonary arteries to the segmental level. No evidence of pulmonary embolism. The heart size is mildly enlarged. No pericardial effusion.  Mediastinum/Nodes: No enlarged mediastinal, hilar, or axillary lymph nodes. Thyroid gland, trachea, and esophagus demonstrate no significant findings. Lungs/Pleura: There is a small left pleural effusion. Patchy consolidation of the left lower lobe, inferior left upper lobe, peripheral right lower lobe and to a lesser degree right middle lobe are noted. Musculoskeletal: Postsurgical changes of thoracic spine are noted. Review of the MIP images confirms the above findings. CT ABDOMEN and PELVIS FINDINGS Hepatobiliary: There is diffuse low density of the liver without vessel displacement. No focal liver lesion is identified. The gallbladder is normal. The biliary tree is normal. Pancreas: Unremarkable. No pancreatic ductal dilatation or surrounding inflammatory changes. Spleen: No focal or acute abnormality identified within the spleen. Adrenals/Urinary Tract: Adrenal glands are unremarkable. Kidneys are normal, without renal calculi, focal lesion, or hydronephrosis. Bladder is unremarkable. Stomach/Bowel: There is a small hiatal hernia. The stomach is otherwise normal. There is no small bowel obstruction. There is no evidence of diverticulitis. The appendix is not definitely seen but no inflammation is noted around cecum. Vascular/Lymphatic: Aortic atherosclerosis. No enlarged abdominal or pelvic lymph nodes. Reproductive: Prostate is unremarkable. Other: There is mild umbilical herniation of mesenteric fat. Musculoskeletal: Degenerative joint changes of the spine are noted. Review of the MIP images confirms the above findings. IMPRESSION: No pulmonary embolus. Patchy consolidations of bilateral lungs suspicious for developing pneumonias. Small left pleural effusion. No evidence of diverticulitis. No acute abnormality identified in the abdomen and pelvis. Fatty infiltration of liver. Electronically Signed   By: Abelardo Diesel M.D.   On: 05/18/2017 16:26   US Renal  Result Date: 05/19/2017 CLINICAL DATA:  Acute  kidney injury. Admitted for pneumonia. History of hypertension, lymphoma, diabetes. EXAM: RENAL / URINARY TRACT ULTRASOUND COMPLETE COMPARISON:  CT chest, abdomen and pelvis May 18, 2017 FINDINGS: Right Kidney: Length: 12.8 cm. Echogenicity within normal limits. No mass or hydronephrosis visualized. Left Kidney: Length: 11 cm. Echogenicity within normal limits. No mass or hydronephrosis visualized. Bladder: Decompressed. Incidental LEFT pleural effusion as seen on yesterday's CT chest. IMPRESSION: Normal renal ultrasound. Electronically Signed   By: Elon Alas M.D.   On: 05/19/2017 13:46    Jonnell Hentges, DO  [facility] Pager [phone]  If 7PM-7AM, please contact night-coverage [email or web address] Password [email or web address] 05/20/2017, 5:20 PM   LOS: 2 days

## 2017-05-21 ENCOUNTER — Ambulatory Visit: Payer: Medicare Other | Admitting: Endocrinology

## 2017-05-21 DIAGNOSIS — Z0289 Encounter for other administrative examinations: Secondary | ICD-10-CM

## 2017-05-21 LAB — BASIC METABOLIC PANEL
ANION GAP: 9 (ref 5–15)
BUN: 21 mg/dL — AB (ref 6–20)
CO2: 25 mmol/L (ref 22–32)
Calcium: 8.5 mg/dL — ABNORMAL LOW (ref 8.9–10.3)
Chloride: 104 mmol/L (ref 101–111)
Creatinine, Ser: 1.12 mg/dL (ref 0.61–1.24)
GFR calc Af Amer: >60 mL/min (ref >60)
GFR calc non Af Amer: >60 mL/min (ref >60)
GLUCOSE: 217 mg/dL — AB (ref 65–99)
POTASSIUM: 3.7 mmol/L (ref 3.5–5.1)
Sodium: 138 mmol/L (ref 135–145)

## 2017-05-21 LAB — GLUCOSE, CAPILLARY
GLUCOSE-CAPILLARY: 236 mg/dL — AB (ref 65–99)
GLUCOSE-CAPILLARY: 165 mg/dL — AB (ref 65–99)
Glucose-Capillary: 201 mg/dL — ABNORMAL HIGH (ref 65–99)
Glucose-Capillary: 148 mg/dL — ABNORMAL HIGH (ref 65–99)

## 2017-05-21 LAB — HEMOGLOBIN A1C
Hgb A1c MFr Bld: 8.2 % — ABNORMAL HIGH (ref 4.8–5.6)
Mean Plasma Glucose: 188.64 mg/dL

## 2017-05-21 LAB — MAGNESIUM: Magnesium: 2.1 mg/dL (ref 1.7–2.4)

## 2017-05-21 LAB — CK: Total CK: 419 U/L — ABNORMAL HIGH (ref 49–397)

## 2017-05-21 MED ORDER — METOPROLOL TARTRATE 25 MG PO TABS
12.5000 mg | ORAL_TABLET | Freq: Two times a day (BID) | ORAL | Status: DC
  Administered 2017-05-21 (×2): 12.5 mg via ORAL
  Filled 2017-05-21 (×2): qty 1

## 2017-05-21 MED ORDER — METOPROLOL TARTRATE 25 MG PO TABS: 12.5000 mg | ORAL_TABLET | Freq: Two times a day (BID) | ORAL | 1 refills | Status: DC

## 2017-05-21 MED ORDER — POTASSIUM CHLORIDE CRYS ER 20 MEQ PO TBCR
20.0000 meq | EXTENDED_RELEASE_TABLET | Freq: Once | ORAL | Status: AC
  Administered 2017-05-21: 20 meq via ORAL
  Filled 2017-05-21: qty 1

## 2017-05-21 MED ORDER — AZITHROMYCIN 500 MG PO TABS: 500.0000 mg | ORAL_TABLET | Freq: Every day | ORAL | 0 refills | Status: DC

## 2017-05-21 MED ORDER — IPRATROPIUM-ALBUTEROL 0.5-2.5 (3) MG/3ML IN SOLN
3.0000 mL | Freq: Two times a day (BID) | RESPIRATORY_TRACT | Status: DC
  Administered 2017-05-21: 3 mL via RESPIRATORY_TRACT
  Filled 2017-05-21: qty 3

## 2017-05-21 MED ORDER — CEFPODOXIME PROXETIL 200 MG PO TABS: 200.0000 mg | ORAL_TABLET | Freq: Two times a day (BID) | ORAL | 0 refills | Status: DC

## 2017-05-21 NOTE — Care Management Note (Signed)
Case Management Note  Patient Details  Name: [name] MRN: [id number] Date of Birth: July 12, 1951  Expected Discharge Date:  05/21/17               Expected Discharge Plan:  [place]  In-House Referral:  NA  Discharge planning Services  CM Consult  Post Acute Care Choice:  Home Health, Durable Medical Equipment Choice offered to:  Patient  DME Arranged:  [facility] rolling DME Agency:  [place] Arranged:  RN, PT [facility] Agency:  [place]  Status of Service:  Completed, signed off  Additional Comments: Pt discharging home today. Pt may decide to go stay at dtr house after DC. PT has recommended RW and HH PT. Pt agreeable, no preference of HH/DME provider, agreeable to [facility] Brad, [facility] rep, aware of referral and will pull pt info from [facility]. Pt aware HH has 48 hrs to make first visit.  Sherald Barge, RN 05/21/2017, 11:47 AM

## 2017-05-21 NOTE — Evaluation (Signed)
Physical Therapy Evaluation Patient Details Name: [name] MRN: [id number] DOB: 1951/02/22 Today's Date: 05/21/2017   History of Present Illness  MALYK GIROUARD is a 66 y.o. 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  Abdominal pain in LLQ.  No n/v/d.  SOB since Thursday or Friday.  Chronic cough.  Cough is productive of a small amount of white sputum.  Excessively sleepy during my evaluation and so history was limited.    Clinical Impression  Patient limited mostly due to SOB (see below).  Patient will benefit from continued physical therapy in hospital and recommended venue below to increase strength, balance, endurance for safe ADLs and gait.    Follow Up Recommendations Home health PT;Supervision/Assistance - 24 hour    Equipment Recommendations  Rolling walker with 5" wheels    Recommendations for Other Services       Precautions / Restrictions Precautions Precautions: Fall Restrictions Weight Bearing Restrictions: No      Mobility  Bed Mobility Overal bed mobility: Needs Assistance Bed Mobility: Supine to Sit;Sit to Supine     Supine to sit: Supervision Sit to supine: Supervision      Transfers Overall transfer level: Needs assistance Equipment used: Rolling walker (2 wheeled) Transfers: Sit to/from [facility] Sit to Stand: Supervision Stand pivot transfers: Supervision          Ambulation/Gait Ambulation/Gait assistance: Min guard Ambulation Distance (Feet): 20 Feet Assistive device: Rolling walker (2 wheeled);Straight cane Gait Pattern/deviations: Decreased step length - right;Decreased step length - left;Decreased stride length   Gait velocity interpretation: Below normal speed for age/gender General Gait Details: limited secondary to left sided chest  wall soreness and SOB with O2 sats dropping to 88% on room, able to recover to 94% once in chair  Stairs            Wheelchair Mobility    Modified Rankin (Stroke Patients Only)       Balance Overall balance assessment: Needs assistance Sitting-balance support: Feet supported;No upper extremity supported Sitting balance-Leahy Scale: Good     Standing balance support: Bilateral upper extremity supported;During functional activity Standing balance-Leahy Scale: Fair                               Pertinent Vitals/Pain Pain Assessment: 0-10 Pain Score: 5  Pain Location: left flank possibly due to severe coughing per patient Pain Descriptors / Indicators: Pressure;Sore Pain Intervention(s): Limited activity within patient's tolerance;Monitored during session    [place] expects to be discharged to:: Private residence Living Arrangements: Children Available Help at Discharge: Family Type of Home: House Home Access: Level entry     Home Layout: One level [place]: Kasandra Knudsen - single point      Prior Function Level of Independence: Independent         Comments: occasionally uses SPC due to knee pain     Hand Dominance        Extremity/Trunk Assessment   Upper Extremity Assessment Upper Extremity Assessment: Generalized weakness    Lower Extremity Assessment Lower Extremity Assessment: Generalized weakness    Cervical / Trunk Assessment Cervical / Trunk Assessment: Kyphotic  Communication   Communication: No difficulties  Cognition Arousal/Alertness: Awake/alert Behavior During Therapy: WFL for tasks assessed/performed Overall  Cognitive Status: Within Functional Limits for tasks assessed                                        General Comments      Exercises     Assessment/Plan    PT Assessment Patient needs continued PT services  PT Problem List Decreased strength;Decreased activity  tolerance;Decreased balance;Decreased mobility       PT Treatment Interventions Gait training;Therapeutic activities;Therapeutic exercise;Patient/family education;Functional mobility training    PT Goals (Current goals can be found in the Care Plan section)  Acute Rehab PT Goals Patient Stated Goal: Return home able to walk safely PT Goal Formulation: With patient Time For Goal Achievement: 05/25/17 Potential to Achieve Goals: Good    Frequency Min 3X/week   Barriers to discharge        Co-evaluation               AM-PAC PT "6 Clicks" Daily Activity  Outcome Measure Difficulty turning over in bed (including adjusting bedclothes, sheets and blankets)?: None Difficulty moving from lying on back to sitting on the side of the bed? : None Difficulty sitting down on and standing up from a chair with arms (e.g., wheelchair, bedside commode, etc,.)?: None Help needed moving to and from a bed to chair (including a wheelchair)?: A Little Help needed walking in hospital room?: A Little Help needed climbing 3-5 steps with a railing? : A Little 6 Click Score: 21    End of Session   Activity Tolerance: Patient limited by fatigue Patient left: in chair;with call bell/phone within reach Nurse Communication: Mobility status PT Visit Diagnosis: Unsteadiness on feet (R26.81);Other abnormalities of gait and mobility (R26.89);Muscle weakness (generalized) (M62.81)    Time: 7371-0626 PT Time Calculation (min) (ACUTE ONLY): 33 min   Charges:   PT Evaluation $PT Eval Low Complexity: 1 Low PT Treatments $Therapeutic Activity: 23-37 mins   PT G Codes:   PT G-Codes **NOT FOR INPATIENT CLASS** Functional Assessment Tool Used: AM-PAC 6 Clicks Basic Mobility Functional Limitation: Mobility: Walking and moving around Mobility: Walking and Moving Around Current Status (R4854): At least 20 percent but less than 40 percent impaired, limited or restricted Mobility: Walking and Moving Around  Goal Status [phone]): At least 20 percent but less than 40 percent impaired, limited or restricted Mobility: Walking and Moving Around Discharge Status [phone]): At least 20 percent but less than 40 percent impaired, limited or restricted    3:38 PM, 05/21/17 Lonell Grandchild, MPT Physical Therapist with [facility] [phone] office [phone] mobile phone

## 2017-05-21 NOTE — Progress Notes (Signed)
SATURATION QUALIFICATIONS: (This note is used to comply with regulatory documentation for home oxygen)  Patient Saturations on Room Air at Rest = 93%  Patient Saturations on Room Air while Ambulating = 95%  Patient Saturations on Room Air after Ambulating = 92%

## 2017-05-21 NOTE — Progress Notes (Signed)
patient being d/c @ this time. IV and telemetry removed. D/c teaching reviewed by nurse and signed by patient

## 2017-05-21 NOTE — Care Management Important Message (Signed)
Important Message  Patient Details  Name: [name] MRN: [id number] Date of Birth: Jun 28, 1951   Medicare Important Message Given:  Yes    Sherald Barge, RN 05/21/2017, 11:11 AM

## 2017-05-21 NOTE — Discharge Summary (Addendum)
Physician Discharge Summary  [name]:[id number] DOB: Nov 10, 1950 DOA: 05/18/2017  PCP: Redmond School, MD  Admit date: 05/18/2017 Discharge date: 05/21/2017  Admitted From: Home Disposition: Home  Recommendations for Outpatient Follow-up:  1. Follow up with PCP in 1-2 weeks 2. Please obtain BMP/CBC and CK in one week 3. Restart zocor 20 mg if repeat CK is normal   Home Health: YES Equipment/Devices: Rolling walker, HHPT  Discharge Condition: Stable CODE STATUS:FULL Diet recommendation: Heart Healthy / Carb Modified   Brief/Interim Summary: 66 year old male with history of large B cell lymphoma status post stem cell transplant 2007, essential hypertension, hyperlipidemia, diabetes mellitus presented with shortness of breath that began on 05/14/2017. The patient has also been complaining of worsening cough with clear sputum production at that time. He denied any fevers, chills, headache, neck pain, nausea, vomiting, diarrhea, dysuria, hematuria. He complained of left sided chest pain radiating to his left upper quadrant. Upon admission, CT angioof the chest was performed was negative for pulmonary embolus but showed LLL consolidation with small left pleural effusion. There is also consolidation of RLL.CT of abdomen was negative for any acute findings.The patient was started on azithromycin and ceftriaxone.  Discharge Diagnoses:  Sepsis -Secondary to pneumonia -Continue ceftriaxone and azithromycin -d/c with cefpodoxime and azithromycin x 4 more days to complete one week of tx -Continue IV fluids -Lactic acid peaked to 2.7>>>1.3 -Procalcitonin 27.0  Lobar Pneumonia -05/18/17 CTA chest--neg for PE, showed LLL consolidation with small left pleural effusion. There is also consolidation of RLL -Continue ceftriaxone and azithromycin -d/c prednisone  Acute respiratory failure with hypoxia -Secondary to pneumonia -Presently stable on 2 L nasal cannula-->weaned to  RA -wean oxygen for saturation greater than 92% -continue bronchodilators -pt did not desaturate with ambulation  AKI -Secondary to sepsis -Baseline creatinine 1.0-1.3 -Anticipate a serum creatinine to plateau in next 24 hours -renal US--neg for hydronephrosis -serum creatinine 1.12 at time of d/c  Atypical chest pain -likely due to PNA -troponins neg x 3 -personally reviewed EKG--sinus tachycardia, nonspecific T-wave changes -personally reviewed CXR--LLL opacity, bilateral pleural effusions  Essential hypertension -Holdinglisinopril and HCTZ due to AKI and soft BPs initially -will not restart lisinopril/HCTZ in setting of AKI -start low dose metoprolol tartrate   Diabetes mellitus type 2, controlled -02/19/2017 hemoglobin A1c 6.2 -NovoLog sliding scale -Holding Amaryl--restart after d/c -CBGs elevated due to steroids -add lantus 5 units during the hospitalization -anticipate improvement now with d/c steroids  Hyperlipidemia -holding zocor 20 mg due to elevated CK -CK 989>>>419  Rhabdomyolysis -mild -d/c statin for now -repeat CK in one week--can restart zocor 20 mg if CK ok   Discharge Instructions  Discharge Instructions    Diet - low sodium heart healthy    Complete by:  As directed    Increase activity slowly    Complete by:  As directed      Allergies as of 05/21/2017      Reactions   Penicillins Rash   Has patient had a PCN reaction causing immediate rash, facial/tongue/throat swelling, SOB or lightheadedness with hypotension: Unknown Has patient had a PCN reaction causing severe rash involving mucus membranes or skin necrosis: Unknown Has patient had a PCN reaction that required hospitalization: Unknown Has patient had a PCN reaction occurring within the last 10 years: No If all of the above answers are "NO", then may proceed with Cephalosporin use.      Medication List    STOP taking these medications   hydrochlorothiazide 25 MG  tablet  Commonly known as:  HYDRODIURIL   ibuprofen 200 MG tablet Commonly known as:  ADVIL,MOTRIN   lisinopril 2.5 MG tablet Commonly known as:  PRINIVIL,ZESTRIL   simvastatin 20 MG tablet Commonly known as:  ZOCOR     TAKE these medications   azithromycin 500 MG tablet Commonly known as:  ZITHROMAX Take 1 tablet (500 mg total) by mouth daily.   cefpodoxime 200 MG tablet Commonly known as:  VANTIN Take 1 tablet (200 mg total) by mouth 2 (two) times daily.   glimepiride 1 MG tablet Commonly known as:  AMARYL Take 0.5 tablets (0.5 mg total) by mouth daily with breakfast.   metoprolol tartrate 25 MG tablet Commonly known as:  LOPRESSOR Take 0.5 tablets (12.5 mg total) by mouth 2 (two) times daily.            Durable Medical Equipment        Start     Ordered   05/21/17 1114  For home use only DME Walker rolling  Once    Comments:  May need bariatric RW Ht: 5'11" Wt: 276lbs  Question:  Patient needs a walker to treat with the following condition  Answer:  Sepsis ([name])   05/21/17 1114   05/21/17 1106  For home use only DME oxygen  Once    Question Answer Comment  Mode or (Route) Nasal cannula   Liters per Minute 2   Frequency Continuous (stationary and portable oxygen unit needed)   Oxygen delivery system Gas      05/21/17 1106       Discharge Care Instructions        Start     Ordered   05/21/17 0000  azithromycin (ZITHROMAX) 500 MG tablet  Daily     05/21/17 1019   05/21/17 0000  metoprolol tartrate (LOPRESSOR) 25 MG tablet  2 times daily     05/21/17 1019   05/21/17 0000  cefpodoxime (VANTIN) 200 MG tablet  2 times daily     05/21/17 1019   05/21/17 0000  Increase activity slowly     05/21/17 1019   05/21/17 0000  Diet - low sodium heart healthy     05/21/17 1019      Allergies  Allergen Reactions  . Penicillins Rash    Has patient had a PCN reaction causing immediate rash, facial/tongue/throat swelling, SOB or lightheadedness with  hypotension: Unknown Has patient had a PCN reaction causing severe rash involving mucus membranes or skin necrosis: Unknown Has patient had a PCN reaction that required hospitalization: Unknown Has patient had a PCN reaction occurring within the last 10 years: No If all of the above answers are "NO", then may proceed with Cephalosporin use.     Consultations:  none   Procedures/Studies: Dg Chest 2 View  Result Date: 05/18/2017 CLINICAL DATA:  Shortness of breath, productive cough. EXAM: CHEST  2 VIEW COMPARISON:  Radiographs of December 01, 2012. FINDINGS: Stable cardiomediastinal silhouette. Atherosclerosis of thoracic aorta is noted. Mild central pulmonary vascular congestion is noted. Increased bibasilar atelectasis or edema is noted with mild bilateral pleural effusions. Status post surgical fixation of midthoracic spine. IMPRESSION: Aortic atherosclerosis. Mild central pulmonary vascular congestion. Increased bibasilar atelectasis or edema is noted with mild bilateral pleural effusions. Electronically Signed   By: Marijo Conception, M.D.   On: 05/18/2017 12:43   Ct Angio Chest Pe W And/or Wo Contrast  Result Date: 05/18/2017 CLINICAL DATA:  Shortness of breath today and cough. Abdomen pain. Suspect diverticulitis. EXAM:  CT ANGIOGRAPHY CHEST CT ABDOMEN AND PELVIS WITH CONTRAST TECHNIQUE: Multidetector CT imaging of the chest was performed using the standard protocol during bolus administration of intravenous contrast. Multiplanar CT image reconstructions and MIPs were obtained to evaluate the vascular anatomy. Multidetector CT imaging of the abdomen and pelvis was performed using the standard protocol during bolus administration of intravenous contrast. CONTRAST:  80 mL Isovue 370. COMPARISON:  None. FINDINGS: CTA CHEST FINDINGS Cardiovascular: Satisfactory opacification of the pulmonary arteries to the segmental level. No evidence of pulmonary embolism. The heart size is mildly enlarged. No  pericardial effusion. Mediastinum/Nodes: No enlarged mediastinal, hilar, or axillary lymph nodes. Thyroid gland, trachea, and esophagus demonstrate no significant findings. Lungs/Pleura: There is a small left pleural effusion. Patchy consolidation of the left lower lobe, inferior left upper lobe, peripheral right lower lobe and to a lesser degree right middle lobe are noted. Musculoskeletal: Postsurgical changes of thoracic spine are noted. Review of the MIP images confirms the above findings. CT ABDOMEN and PELVIS FINDINGS Hepatobiliary: There is diffuse low density of the liver without vessel displacement. No focal liver lesion is identified. The gallbladder is normal. The biliary tree is normal. Pancreas: Unremarkable. No pancreatic ductal dilatation or surrounding inflammatory changes. Spleen: No focal or acute abnormality identified within the spleen. Adrenals/Urinary Tract: Adrenal glands are unremarkable. Kidneys are normal, without renal calculi, focal lesion, or hydronephrosis. Bladder is unremarkable. Stomach/Bowel: There is a small hiatal hernia. The stomach is otherwise normal. There is no small bowel obstruction. There is no evidence of diverticulitis. The appendix is not definitely seen but no inflammation is noted around cecum. Vascular/Lymphatic: Aortic atherosclerosis. No enlarged abdominal or pelvic lymph nodes. Reproductive: Prostate is unremarkable. Other: There is mild umbilical herniation of mesenteric fat. Musculoskeletal: Degenerative joint changes of the spine are noted. Review of the MIP images confirms the above findings. IMPRESSION: No pulmonary embolus. Patchy consolidations of bilateral lungs suspicious for developing pneumonias. Small left pleural effusion. No evidence of diverticulitis. No acute abnormality identified in the abdomen and pelvis. Fatty infiltration of liver. Electronically Signed   By: Abelardo Diesel M.D.   On: 05/18/2017 16:26   Ct Abdomen Pelvis W Contrast  Result  Date: 05/18/2017 CLINICAL DATA:  Shortness of breath today and cough. Abdomen pain. Suspect diverticulitis. EXAM: CT ANGIOGRAPHY CHEST CT ABDOMEN AND PELVIS WITH CONTRAST TECHNIQUE: Multidetector CT imaging of the chest was performed using the standard protocol during bolus administration of intravenous contrast. Multiplanar CT image reconstructions and MIPs were obtained to evaluate the vascular anatomy. Multidetector CT imaging of the abdomen and pelvis was performed using the standard protocol during bolus administration of intravenous contrast. CONTRAST:  80 mL Isovue 370. COMPARISON:  None. FINDINGS: CTA CHEST FINDINGS Cardiovascular: Satisfactory opacification of the pulmonary arteries to the segmental level. No evidence of pulmonary embolism. The heart size is mildly enlarged. No pericardial effusion. Mediastinum/Nodes: No enlarged mediastinal, hilar, or axillary lymph nodes. Thyroid gland, trachea, and esophagus demonstrate no significant findings. Lungs/Pleura: There is a small left pleural effusion. Patchy consolidation of the left lower lobe, inferior left upper lobe, peripheral right lower lobe and to a lesser degree right middle lobe are noted. Musculoskeletal: Postsurgical changes of thoracic spine are noted. Review of the MIP images confirms the above findings. CT ABDOMEN and PELVIS FINDINGS Hepatobiliary: There is diffuse low density of the liver without vessel displacement. No focal liver lesion is identified. The gallbladder is normal. The biliary tree is normal. Pancreas: Unremarkable. No pancreatic ductal dilatation or surrounding inflammatory changes.  Spleen: No focal or acute abnormality identified within the spleen. Adrenals/Urinary Tract: Adrenal glands are unremarkable. Kidneys are normal, without renal calculi, focal lesion, or hydronephrosis. Bladder is unremarkable. Stomach/Bowel: There is a small hiatal hernia. The stomach is otherwise normal. There is no small bowel obstruction. There  is no evidence of diverticulitis. The appendix is not definitely seen but no inflammation is noted around cecum. Vascular/Lymphatic: Aortic atherosclerosis. No enlarged abdominal or pelvic lymph nodes. Reproductive: Prostate is unremarkable. Other: There is mild umbilical herniation of mesenteric fat. Musculoskeletal: Degenerative joint changes of the spine are noted. Review of the MIP images confirms the above findings. IMPRESSION: No pulmonary embolus. Patchy consolidations of bilateral lungs suspicious for developing pneumonias. Small left pleural effusion. No evidence of diverticulitis. No acute abnormality identified in the abdomen and pelvis. Fatty infiltration of liver. Electronically Signed   By: Abelardo Diesel M.D.   On: 05/18/2017 16:26   US Renal  Result Date: 05/19/2017 CLINICAL DATA:  Acute kidney injury. Admitted for pneumonia. History of hypertension, lymphoma, diabetes. EXAM: RENAL / URINARY TRACT ULTRASOUND COMPLETE COMPARISON:  CT chest, abdomen and pelvis May 18, 2017 FINDINGS: Right Kidney: Length: 12.8 cm. Echogenicity within normal limits. No mass or hydronephrosis visualized. Left Kidney: Length: 11 cm. Echogenicity within normal limits. No mass or hydronephrosis visualized. Bladder: Decompressed. Incidental LEFT pleural effusion as seen on yesterday's CT chest. IMPRESSION: Normal renal ultrasound. Electronically Signed   By: Elon Alas M.D.   On: 05/19/2017 13:46        Discharge Exam: Vitals:   05/21/17 0432 05/21/17 0840  BP: (!) 141/65   Pulse: 81   Resp: 20   Temp: 99.1 F (37.3 C)   SpO2: 94% 95%   Vitals:   05/20/17 1952 05/20/17 2051 05/21/17 0432 05/21/17 0840  BP:  118/69 (!) 141/65   Pulse:  100 81   Resp:  20 20   Temp:  98.2 F (36.8 C) 99.1 F (37.3 C)   TempSrc:  Oral Oral   SpO2: 95% 96% 94% 95%  Weight:  125.6 kg (276 lb 12.8 oz)    Height:  5\' 11"  (1.803 m)      General: Pt is alert, awake, not in acute distress Cardiovascular:  RRR, S1/S2 +, no rubs, no gallops Respiratory: CTA bilaterally, no wheezing, no rhonchi Abdominal: Soft, NT, ND, bowel sounds + Extremities: no edema, no cyanosis   The results of significant diagnostics from this hospitalization (including imaging, microbiology, ancillary and laboratory) are listed below for reference.    Significant Diagnostic Studies: Dg Chest 2 View  Result Date: 05/18/2017 CLINICAL DATA:  Shortness of breath, productive cough. EXAM: CHEST  2 VIEW COMPARISON:  Radiographs of December 01, 2012. FINDINGS: Stable cardiomediastinal silhouette. Atherosclerosis of thoracic aorta is noted. Mild central pulmonary vascular congestion is noted. Increased bibasilar atelectasis or edema is noted with mild bilateral pleural effusions. Status post surgical fixation of midthoracic spine. IMPRESSION: Aortic atherosclerosis. Mild central pulmonary vascular congestion. Increased bibasilar atelectasis or edema is noted with mild bilateral pleural effusions. Electronically Signed   By: Marijo Conception, M.D.   On: 05/18/2017 12:43   Ct Angio Chest Pe W And/or Wo Contrast  Result Date: 05/18/2017 CLINICAL DATA:  Shortness of breath today and cough. Abdomen pain. Suspect diverticulitis. EXAM: CT ANGIOGRAPHY CHEST CT ABDOMEN AND PELVIS WITH CONTRAST TECHNIQUE: Multidetector CT imaging of the chest was performed using the standard protocol during bolus administration of intravenous contrast. Multiplanar CT image reconstructions and MIPs were obtained  to evaluate the vascular anatomy. Multidetector CT imaging of the abdomen and pelvis was performed using the standard protocol during bolus administration of intravenous contrast. CONTRAST:  80 mL Isovue 370. COMPARISON:  None. FINDINGS: CTA CHEST FINDINGS Cardiovascular: Satisfactory opacification of the pulmonary arteries to the segmental level. No evidence of pulmonary embolism. The heart size is mildly enlarged. No pericardial effusion. Mediastinum/Nodes: No  enlarged mediastinal, hilar, or axillary lymph nodes. Thyroid gland, trachea, and esophagus demonstrate no significant findings. Lungs/Pleura: There is a small left pleural effusion. Patchy consolidation of the left lower lobe, inferior left upper lobe, peripheral right lower lobe and to a lesser degree right middle lobe are noted. Musculoskeletal: Postsurgical changes of thoracic spine are noted. Review of the MIP images confirms the above findings. CT ABDOMEN and PELVIS FINDINGS Hepatobiliary: There is diffuse low density of the liver without vessel displacement. No focal liver lesion is identified. The gallbladder is normal. The biliary tree is normal. Pancreas: Unremarkable. No pancreatic ductal dilatation or surrounding inflammatory changes. Spleen: No focal or acute abnormality identified within the spleen. Adrenals/Urinary Tract: Adrenal glands are unremarkable. Kidneys are normal, without renal calculi, focal lesion, or hydronephrosis. Bladder is unremarkable. Stomach/Bowel: There is a small hiatal hernia. The stomach is otherwise normal. There is no small bowel obstruction. There is no evidence of diverticulitis. The appendix is not definitely seen but no inflammation is noted around cecum. Vascular/Lymphatic: Aortic atherosclerosis. No enlarged abdominal or pelvic lymph nodes. Reproductive: Prostate is unremarkable. Other: There is mild umbilical herniation of mesenteric fat. Musculoskeletal: Degenerative joint changes of the spine are noted. Review of the MIP images confirms the above findings. IMPRESSION: No pulmonary embolus. Patchy consolidations of bilateral lungs suspicious for developing pneumonias. Small left pleural effusion. No evidence of diverticulitis. No acute abnormality identified in the abdomen and pelvis. Fatty infiltration of liver. Electronically Signed   By: Abelardo Diesel M.D.   On: 05/18/2017 16:26   Ct Abdomen Pelvis W Contrast  Result Date: 05/18/2017 CLINICAL DATA:  Shortness  of breath today and cough. Abdomen pain. Suspect diverticulitis. EXAM: CT ANGIOGRAPHY CHEST CT ABDOMEN AND PELVIS WITH CONTRAST TECHNIQUE: Multidetector CT imaging of the chest was performed using the standard protocol during bolus administration of intravenous contrast. Multiplanar CT image reconstructions and MIPs were obtained to evaluate the vascular anatomy. Multidetector CT imaging of the abdomen and pelvis was performed using the standard protocol during bolus administration of intravenous contrast. CONTRAST:  80 mL Isovue 370. COMPARISON:  None. FINDINGS: CTA CHEST FINDINGS Cardiovascular: Satisfactory opacification of the pulmonary arteries to the segmental level. No evidence of pulmonary embolism. The heart size is mildly enlarged. No pericardial effusion. Mediastinum/Nodes: No enlarged mediastinal, hilar, or axillary lymph nodes. Thyroid gland, trachea, and esophagus demonstrate no significant findings. Lungs/Pleura: There is a small left pleural effusion. Patchy consolidation of the left lower lobe, inferior left upper lobe, peripheral right lower lobe and to a lesser degree right middle lobe are noted. Musculoskeletal: Postsurgical changes of thoracic spine are noted. Review of the MIP images confirms the above findings. CT ABDOMEN and PELVIS FINDINGS Hepatobiliary: There is diffuse low density of the liver without vessel displacement. No focal liver lesion is identified. The gallbladder is normal. The biliary tree is normal. Pancreas: Unremarkable. No pancreatic ductal dilatation or surrounding inflammatory changes. Spleen: No focal or acute abnormality identified within the spleen. Adrenals/Urinary Tract: Adrenal glands are unremarkable. Kidneys are normal, without renal calculi, focal lesion, or hydronephrosis. Bladder is unremarkable. Stomach/Bowel: There is a small hiatal hernia.  The stomach is otherwise normal. There is no small bowel obstruction. There is no evidence of diverticulitis. The  appendix is not definitely seen but no inflammation is noted around cecum. Vascular/Lymphatic: Aortic atherosclerosis. No enlarged abdominal or pelvic lymph nodes. Reproductive: Prostate is unremarkable. Other: There is mild umbilical herniation of mesenteric fat. Musculoskeletal: Degenerative joint changes of the spine are noted. Review of the MIP images confirms the above findings. IMPRESSION: No pulmonary embolus. Patchy consolidations of bilateral lungs suspicious for developing pneumonias. Small left pleural effusion. No evidence of diverticulitis. No acute abnormality identified in the abdomen and pelvis. Fatty infiltration of liver. Electronically Signed   By: Abelardo Diesel M.D.   On: 05/18/2017 16:26   US Renal  Result Date: 05/19/2017 CLINICAL DATA:  Acute kidney injury. Admitted for pneumonia. History of hypertension, lymphoma, diabetes. EXAM: RENAL / URINARY TRACT ULTRASOUND COMPLETE COMPARISON:  CT chest, abdomen and pelvis May 18, 2017 FINDINGS: Right Kidney: Length: 12.8 cm. Echogenicity within normal limits. No mass or hydronephrosis visualized. Left Kidney: Length: 11 cm. Echogenicity within normal limits. No mass or hydronephrosis visualized. Bladder: Decompressed. Incidental LEFT pleural effusion as seen on yesterday's CT chest. IMPRESSION: Normal renal ultrasound. Electronically Signed   By: Elon Alas M.D.   On: 05/19/2017 13:46     Microbiology: Recent Results (from the past 240 hour(s))  MRSA PCR Screening     Status: None   Collection Time: 05/18/17  9:32 PM  Result Value Ref Range Status   MRSA by PCR NEGATIVE NEGATIVE Final    Comment:        The GeneXpert MRSA Assay (FDA approved for NASAL specimens only), is one component of a comprehensive MRSA colonization surveillance program. It is not intended to diagnose MRSA infection nor to guide or monitor treatment for MRSA infections.   Culture, blood (routine x 2) Call MD if unable to obtain prior to  antibiotics being given     Status: None (Preliminary result)   Collection Time: 05/18/17  9:43 PM  Result Value Ref Range Status   Specimen Description RIGHT ANTECUBITAL  Final   Special Requests   Final    BOTTLES DRAWN AEROBIC AND ANAEROBIC Blood Culture results may not be optimal due to an inadequate volume of blood received in culture bottles   Culture NO GROWTH 3 DAYS  Final   Report Status PENDING  Incomplete  Culture, blood (routine x 2) Call MD if unable to obtain prior to antibiotics being given     Status: None (Preliminary result)   Collection Time: 05/18/17  9:43 PM  Result Value Ref Range Status   Specimen Description BLOOD LEFT HAND  Final   Special Requests   Final    BOTTLES DRAWN AEROBIC ONLY Blood Culture results may not be optimal due to an inadequate volume of blood received in culture bottles   Culture NO GROWTH 3 DAYS  Final   Report Status PENDING  Incomplete     Labs: Basic Metabolic Panel:  Recent Labs Lab 05/18/17 1339 05/19/17 0444 05/20/17 0410 05/21/17 0608  NA 133* 134* 137 138  K 3.7 3.9 3.5 3.7  CL 96* 98* 102 104  CO2 25 23 24 25   GLUCOSE 231* 349* 209* 217*  BUN 23* 32* 26* 21*  CREATININE 1.79* 2.32* 1.34* 1.12  CALCIUM 9.1 8.6* 8.5* 8.5*  MG  --   --   --  2.1   Liver Function Tests:  Recent Labs Lab 05/18/17 1339  AST 18  ALT  21  ALKPHOS 60  BILITOT 1.1  PROT 7.8  ALBUMIN 3.8    Recent Labs Lab 05/18/17 1339  LIPASE 21   No results for input(s): AMMONIA in the last 168 hours. CBC:  Recent Labs Lab 05/18/17 1339 05/19/17 0444 05/20/17 0410  WBC 9.8 14.0* 11.0*  NEUTROABS  --  11.5*  --   HGB 14.4 13.1 12.6*  HCT 41.3 38.3* 36.5*  MCV 92.6 93.6 92.4  PLT 191 179 190   Cardiac Enzymes:  Recent Labs Lab 05/18/17 1339 05/19/17 1040 05/19/17 1616 05/21/17 0608  CKTOTAL  --  984*  --  419*  TROPONINI <0.03 <0.03 <0.03  --    BNP: Invalid input(s): POCBNP CBG:  Recent Labs Lab 05/20/17 0749  05/20/17 1129 05/20/17 1608 05/20/17 2048 05/21/17 0730  GLUCAP 209* 263* 272* 252* 201*    Time coordinating discharge:  Greater than 30 minutes  Signed:  Leitha Hyppolite, DO [facility] Pager: [phone] 05/21/2017, 11:20 AM

## 2017-05-23 LAB — CULTURE, BLOOD (ROUTINE X 2)
Culture: NO GROWTH
Culture: NO GROWTH

## 2017-05-26 DIAGNOSIS — I5032 Chronic diastolic (congestive) heart failure: Secondary | ICD-10-CM | POA: Diagnosis not present

## 2017-05-26 DIAGNOSIS — J181 Lobar pneumonia, unspecified organism: Secondary | ICD-10-CM | POA: Diagnosis not present

## 2017-05-26 DIAGNOSIS — Z7984 Long term (current) use of oral hypoglycemic drugs: Secondary | ICD-10-CM | POA: Diagnosis not present

## 2017-05-26 DIAGNOSIS — Z6841 Body Mass Index (BMI) 40.0 and over, adult: Secondary | ICD-10-CM | POA: Diagnosis not present

## 2017-05-26 DIAGNOSIS — E785 Hyperlipidemia, unspecified: Secondary | ICD-10-CM | POA: Diagnosis not present

## 2017-05-26 DIAGNOSIS — M6282 Rhabdomyolysis: Secondary | ICD-10-CM | POA: Diagnosis not present

## 2017-05-26 DIAGNOSIS — I110 Hypertensive heart disease with heart failure: Secondary | ICD-10-CM | POA: Diagnosis not present

## 2017-05-26 DIAGNOSIS — C8330 Diffuse large B-cell lymphoma, unspecified site: Secondary | ICD-10-CM | POA: Diagnosis not present

## 2017-05-26 DIAGNOSIS — E1151 Type 2 diabetes mellitus with diabetic peripheral angiopathy without gangrene: Secondary | ICD-10-CM | POA: Diagnosis not present

## 2017-05-26 DIAGNOSIS — I10 Essential (primary) hypertension: Secondary | ICD-10-CM | POA: Diagnosis not present

## 2017-05-26 DIAGNOSIS — Z87891 Personal history of nicotine dependence: Secondary | ICD-10-CM | POA: Diagnosis not present

## 2017-05-27 DIAGNOSIS — I10 Essential (primary) hypertension: Secondary | ICD-10-CM | POA: Diagnosis not present

## 2017-05-27 DIAGNOSIS — Z6838 Body mass index (BMI) 38.0-38.9, adult: Secondary | ICD-10-CM | POA: Diagnosis not present

## 2017-05-27 DIAGNOSIS — J189 Pneumonia, unspecified organism: Secondary | ICD-10-CM | POA: Diagnosis not present

## 2017-05-27 DIAGNOSIS — I5032 Chronic diastolic (congestive) heart failure: Secondary | ICD-10-CM | POA: Diagnosis not present

## 2017-05-27 DIAGNOSIS — E669 Obesity, unspecified: Secondary | ICD-10-CM | POA: Diagnosis not present

## 2017-05-27 DIAGNOSIS — Z23 Encounter for immunization: Secondary | ICD-10-CM | POA: Diagnosis not present

## 2017-05-27 DIAGNOSIS — E1165 Type 2 diabetes mellitus with hyperglycemia: Secondary | ICD-10-CM | POA: Diagnosis not present

## 2017-05-28 DIAGNOSIS — I5032 Chronic diastolic (congestive) heart failure: Secondary | ICD-10-CM | POA: Diagnosis not present

## 2017-05-28 DIAGNOSIS — J181 Lobar pneumonia, unspecified organism: Secondary | ICD-10-CM | POA: Diagnosis not present

## 2017-05-28 DIAGNOSIS — I110 Hypertensive heart disease with heart failure: Secondary | ICD-10-CM | POA: Diagnosis not present

## 2017-05-28 DIAGNOSIS — C8330 Diffuse large B-cell lymphoma, unspecified site: Secondary | ICD-10-CM | POA: Diagnosis not present

## 2017-05-28 DIAGNOSIS — E1151 Type 2 diabetes mellitus with diabetic peripheral angiopathy without gangrene: Secondary | ICD-10-CM | POA: Diagnosis not present

## 2017-05-28 DIAGNOSIS — M6282 Rhabdomyolysis: Secondary | ICD-10-CM | POA: Diagnosis not present

## 2017-06-02 DIAGNOSIS — M6282 Rhabdomyolysis: Secondary | ICD-10-CM | POA: Diagnosis not present

## 2017-06-02 DIAGNOSIS — C8330 Diffuse large B-cell lymphoma, unspecified site: Secondary | ICD-10-CM | POA: Diagnosis not present

## 2017-06-02 DIAGNOSIS — I110 Hypertensive heart disease with heart failure: Secondary | ICD-10-CM | POA: Diagnosis not present

## 2017-06-02 DIAGNOSIS — I5032 Chronic diastolic (congestive) heart failure: Secondary | ICD-10-CM | POA: Diagnosis not present

## 2017-06-02 DIAGNOSIS — J181 Lobar pneumonia, unspecified organism: Secondary | ICD-10-CM | POA: Diagnosis not present

## 2017-06-02 DIAGNOSIS — E1151 Type 2 diabetes mellitus with diabetic peripheral angiopathy without gangrene: Secondary | ICD-10-CM | POA: Diagnosis not present

## 2017-06-04 ENCOUNTER — Other Ambulatory Visit: Payer: Self-pay | Admitting: Endocrinology

## 2017-06-07 DIAGNOSIS — E1151 Type 2 diabetes mellitus with diabetic peripheral angiopathy without gangrene: Secondary | ICD-10-CM | POA: Diagnosis not present

## 2017-06-07 DIAGNOSIS — M6282 Rhabdomyolysis: Secondary | ICD-10-CM | POA: Diagnosis not present

## 2017-06-07 DIAGNOSIS — I110 Hypertensive heart disease with heart failure: Secondary | ICD-10-CM | POA: Diagnosis not present

## 2017-06-07 DIAGNOSIS — C8330 Diffuse large B-cell lymphoma, unspecified site: Secondary | ICD-10-CM | POA: Diagnosis not present

## 2017-06-07 DIAGNOSIS — J181 Lobar pneumonia, unspecified organism: Secondary | ICD-10-CM | POA: Diagnosis not present

## 2017-06-07 DIAGNOSIS — I5032 Chronic diastolic (congestive) heart failure: Secondary | ICD-10-CM | POA: Diagnosis not present

## 2017-06-09 DIAGNOSIS — I110 Hypertensive heart disease with heart failure: Secondary | ICD-10-CM | POA: Diagnosis not present

## 2017-06-09 DIAGNOSIS — M6282 Rhabdomyolysis: Secondary | ICD-10-CM | POA: Diagnosis not present

## 2017-06-09 DIAGNOSIS — J181 Lobar pneumonia, unspecified organism: Secondary | ICD-10-CM | POA: Diagnosis not present

## 2017-06-09 DIAGNOSIS — I5032 Chronic diastolic (congestive) heart failure: Secondary | ICD-10-CM | POA: Diagnosis not present

## 2017-06-09 DIAGNOSIS — C8330 Diffuse large B-cell lymphoma, unspecified site: Secondary | ICD-10-CM | POA: Diagnosis not present

## 2017-06-09 DIAGNOSIS — E1151 Type 2 diabetes mellitus with diabetic peripheral angiopathy without gangrene: Secondary | ICD-10-CM | POA: Diagnosis not present

## 2017-06-15 DIAGNOSIS — J181 Lobar pneumonia, unspecified organism: Secondary | ICD-10-CM | POA: Diagnosis not present

## 2017-06-15 DIAGNOSIS — E1151 Type 2 diabetes mellitus with diabetic peripheral angiopathy without gangrene: Secondary | ICD-10-CM | POA: Diagnosis not present

## 2017-06-15 DIAGNOSIS — I110 Hypertensive heart disease with heart failure: Secondary | ICD-10-CM | POA: Diagnosis not present

## 2017-06-15 DIAGNOSIS — I5032 Chronic diastolic (congestive) heart failure: Secondary | ICD-10-CM | POA: Diagnosis not present

## 2017-06-15 DIAGNOSIS — M6282 Rhabdomyolysis: Secondary | ICD-10-CM | POA: Diagnosis not present

## 2017-06-15 DIAGNOSIS — C8330 Diffuse large B-cell lymphoma, unspecified site: Secondary | ICD-10-CM | POA: Diagnosis not present

## 2017-06-17 DIAGNOSIS — M6282 Rhabdomyolysis: Secondary | ICD-10-CM | POA: Diagnosis not present

## 2017-06-17 DIAGNOSIS — J181 Lobar pneumonia, unspecified organism: Secondary | ICD-10-CM | POA: Diagnosis not present

## 2017-06-17 DIAGNOSIS — C8330 Diffuse large B-cell lymphoma, unspecified site: Secondary | ICD-10-CM | POA: Diagnosis not present

## 2017-06-17 DIAGNOSIS — I5032 Chronic diastolic (congestive) heart failure: Secondary | ICD-10-CM | POA: Diagnosis not present

## 2017-06-17 DIAGNOSIS — E1151 Type 2 diabetes mellitus with diabetic peripheral angiopathy without gangrene: Secondary | ICD-10-CM | POA: Diagnosis not present

## 2017-06-17 DIAGNOSIS — I110 Hypertensive heart disease with heart failure: Secondary | ICD-10-CM | POA: Diagnosis not present

## 2017-08-09 ENCOUNTER — Other Ambulatory Visit (HOSPITAL_COMMUNITY): Payer: Medicare Other

## 2017-08-09 ENCOUNTER — Ambulatory Visit (HOSPITAL_COMMUNITY): Payer: Medicare Other

## 2017-09-06 ENCOUNTER — Encounter (HOSPITAL_COMMUNITY): Payer: Self-pay | Admitting: Emergency Medicine

## 2017-09-06 ENCOUNTER — Inpatient Hospital Stay (HOSPITAL_COMMUNITY)
Admission: EM | Admit: 2017-09-06 | Discharge: 2017-09-10 | DRG: 871 | Disposition: A | Discharge status: Skilled Nursing Facility | Payer: Medicare Other | Attending: Family Medicine | Admitting: Family Medicine

## 2017-09-06 ENCOUNTER — Emergency Department (HOSPITAL_COMMUNITY): Payer: Medicare Other

## 2017-09-06 ENCOUNTER — Other Ambulatory Visit: Payer: Self-pay

## 2017-09-06 DIAGNOSIS — J9601 Acute respiratory failure with hypoxia: Secondary | ICD-10-CM | POA: Diagnosis not present

## 2017-09-06 DIAGNOSIS — C8330 Diffuse large B-cell lymphoma, unspecified site: Secondary | ICD-10-CM | POA: Diagnosis present

## 2017-09-06 DIAGNOSIS — Z8572 Personal history of non-Hodgkin lymphomas: Secondary | ICD-10-CM

## 2017-09-06 DIAGNOSIS — I10 Essential (primary) hypertension: Secondary | ICD-10-CM | POA: Diagnosis present

## 2017-09-06 DIAGNOSIS — A419 Sepsis, unspecified organism: Secondary | ICD-10-CM

## 2017-09-06 DIAGNOSIS — Z880 Allergy status to penicillin: Secondary | ICD-10-CM

## 2017-09-06 DIAGNOSIS — J189 Pneumonia, unspecified organism: Secondary | ICD-10-CM | POA: Diagnosis not present

## 2017-09-06 DIAGNOSIS — Z9481 Bone marrow transplant status: Secondary | ICD-10-CM

## 2017-09-06 DIAGNOSIS — N183 Chronic kidney disease, stage 3 (moderate): Secondary | ICD-10-CM | POA: Diagnosis not present

## 2017-09-06 DIAGNOSIS — E119 Type 2 diabetes mellitus without complications: Secondary | ICD-10-CM | POA: Diagnosis present

## 2017-09-06 DIAGNOSIS — E872 Acidosis: Secondary | ICD-10-CM | POA: Diagnosis present

## 2017-09-06 DIAGNOSIS — R6520 Severe sepsis without septic shock: Secondary | ICD-10-CM | POA: Diagnosis present

## 2017-09-06 DIAGNOSIS — I4892 Unspecified atrial flutter: Secondary | ICD-10-CM | POA: Diagnosis not present

## 2017-09-06 DIAGNOSIS — Z6838 Body mass index (BMI) 38.0-38.9, adult: Secondary | ICD-10-CM

## 2017-09-06 DIAGNOSIS — R0602 Shortness of breath: Secondary | ICD-10-CM | POA: Diagnosis not present

## 2017-09-06 DIAGNOSIS — Z8042 Family history of malignant neoplasm of prostate: Secondary | ICD-10-CM

## 2017-09-06 DIAGNOSIS — I110 Hypertensive heart disease with heart failure: Secondary | ICD-10-CM | POA: Diagnosis present

## 2017-09-06 DIAGNOSIS — Z87891 Personal history of nicotine dependence: Secondary | ICD-10-CM

## 2017-09-06 DIAGNOSIS — Z86010 Personal history of colonic polyps: Secondary | ICD-10-CM

## 2017-09-06 DIAGNOSIS — I5032 Chronic diastolic (congestive) heart failure: Secondary | ICD-10-CM | POA: Diagnosis present

## 2017-09-06 DIAGNOSIS — E782 Mixed hyperlipidemia: Secondary | ICD-10-CM | POA: Diagnosis present

## 2017-09-06 DIAGNOSIS — Z801 Family history of malignant neoplasm of trachea, bronchus and lung: Secondary | ICD-10-CM

## 2017-09-06 DIAGNOSIS — E118 Type 2 diabetes mellitus with unspecified complications: Secondary | ICD-10-CM

## 2017-09-06 DIAGNOSIS — E1122 Type 2 diabetes mellitus with diabetic chronic kidney disease: Secondary | ICD-10-CM | POA: Diagnosis not present

## 2017-09-06 DIAGNOSIS — R05 Cough: Secondary | ICD-10-CM | POA: Diagnosis not present

## 2017-09-06 DIAGNOSIS — R531 Weakness: Secondary | ICD-10-CM | POA: Diagnosis not present

## 2017-09-06 DIAGNOSIS — Z7984 Long term (current) use of oral hypoglycemic drugs: Secondary | ICD-10-CM

## 2017-09-06 LAB — COMPREHENSIVE METABOLIC PANEL
ALT: 21 U/L (ref 17–63)
AST: 30 U/L (ref 15–41)
Albumin: 3.8 g/dL (ref 3.5–5.0)
Alkaline Phosphatase: 73 U/L (ref 38–126)
Anion gap: 11 (ref 5–15)
BUN: 10 mg/dL (ref 6–20)
CHLORIDE: 103 mmol/L (ref 101–111)
CO2: 23 mmol/L (ref 22–32)
CREATININE: 1.04 mg/dL (ref 0.61–1.24)
Calcium: 9.2 mg/dL (ref 8.9–10.3)
GFR calc non Af Amer: >60 mL/min (ref >60)
Glucose, Bld: 94 mg/dL (ref 65–99)
POTASSIUM: 4.4 mmol/L (ref 3.5–5.1)
SODIUM: 137 mmol/L (ref 135–145)
Total Bilirubin: 1 mg/dL (ref 0.3–1.2)
Total Protein: 7.4 g/dL (ref 6.5–8.1)

## 2017-09-06 LAB — I-STAT CG4 LACTIC ACID, ED: LACTIC ACID, VENOUS: 2.22 mmol/L — AB (ref 0.5–1.9)

## 2017-09-06 LAB — URINALYSIS, ROUTINE W REFLEX MICROSCOPIC
Bacteria, UA: NONE SEEN
Bilirubin Urine: NEGATIVE
GLUCOSE, UA: NEGATIVE mg/dL
Hgb urine dipstick: NEGATIVE
Ketones, ur: NEGATIVE mg/dL
Leukocytes, UA: NEGATIVE
Nitrite: NEGATIVE
PH: 5 (ref 5.0–8.0)
PROTEIN: 100 mg/dL — AB
Specific Gravity, Urine: 1.021 (ref 1.005–1.030)

## 2017-09-06 LAB — CBC WITH DIFFERENTIAL/PLATELET
Basophils Absolute: 0 10*3/uL (ref 0.0–0.1)
Basophils Relative: 0 %
EOS ABS: 0.1 10*3/uL (ref 0.0–0.7)
Eosinophils Relative: 3 %
HEMATOCRIT: 43.5 % (ref 39.0–52.0)
Hemoglobin: 14.5 g/dL (ref 13.0–17.0)
LYMPHS ABS: 0.7 10*3/uL (ref 0.7–4.0)
LYMPHS PCT: 12 %
MCH: 30.6 pg (ref 26.0–34.0)
MCHC: 33.3 g/dL (ref 30.0–36.0)
MCV: 91.8 fL (ref 78.0–100.0)
MONOS PCT: 12 %
Monocytes Absolute: 0.7 10*3/uL (ref 0.1–1.0)
NEUTROS ABS: 4.2 10*3/uL (ref 1.7–7.7)
NEUTROS PCT: 73 %
Platelets: 166 10*3/uL (ref 150–400)
RBC: 4.74 MIL/uL (ref 4.22–5.81)
RDW: 15 % (ref 11.5–15.5)
WBC: 5.7 10*3/uL (ref 4.0–10.5)

## 2017-09-06 LAB — LACTIC ACID, PLASMA
LACTIC ACID, VENOUS: 1 mmol/L (ref 0.5–1.9)
Lactic Acid, Venous: 1.8 mmol/L (ref 0.5–1.9)

## 2017-09-06 MED ORDER — ENOXAPARIN SODIUM 40 MG/0.4ML ~~LOC~~ SOLN
40.0000 mg | SUBCUTANEOUS | Status: DC
  Administered 2017-09-07: 40 mg via SUBCUTANEOUS
  Filled 2017-09-06: qty 0.4

## 2017-09-06 MED ORDER — ACETAMINOPHEN 500 MG PO TABS
ORAL_TABLET | ORAL | Status: AC
  Filled 2017-09-06: qty 2

## 2017-09-06 MED ORDER — INSULIN ASPART 100 UNIT/ML ~~LOC~~ SOLN
0.0000 [IU] | Freq: Three times a day (TID) | SUBCUTANEOUS | Status: DC
  Administered 2017-09-10: 1 [IU] via SUBCUTANEOUS

## 2017-09-06 MED ORDER — DEXTROSE 5 % IV SOLN
1.0000 g | Freq: Once | INTRAVENOUS | Status: AC
  Administered 2017-09-06: 1 g via INTRAVENOUS
  Filled 2017-09-06: qty 10

## 2017-09-06 MED ORDER — SODIUM CHLORIDE 0.9 % IV BOLUS (SEPSIS)
1000.0000 mL | Freq: Once | INTRAVENOUS | Status: AC
  Administered 2017-09-06: 1000 mL via INTRAVENOUS

## 2017-09-06 MED ORDER — ACETAMINOPHEN 500 MG PO TABS
1000.0000 mg | ORAL_TABLET | Freq: Once | ORAL | Status: AC
  Administered 2017-09-06: 1000 mg via ORAL

## 2017-09-06 MED ORDER — DEXTROSE 5 % IV SOLN
1.0000 g | INTRAVENOUS | Status: DC
  Administered 2017-09-07 – 2017-09-08 (×2): 1 g via INTRAVENOUS
  Filled 2017-09-06 (×3): qty 10

## 2017-09-06 MED ORDER — SODIUM CHLORIDE 0.9 % IV BOLUS (SEPSIS): 1000.0000 mL | Freq: Once | INTRAVENOUS | Status: DC

## 2017-09-06 MED ORDER — SODIUM CHLORIDE 0.9 % IV SOLN
INTRAVENOUS | Status: DC
  Administered 2017-09-07: via INTRAVENOUS

## 2017-09-06 MED ORDER — DEXTROSE 5 % IV SOLN
500.0000 mg | INTRAVENOUS | Status: DC
  Administered 2017-09-07 – 2017-09-08 (×2): 500 mg via INTRAVENOUS
  Filled 2017-09-06 (×3): qty 500

## 2017-09-06 MED ORDER — DEXTROSE 5 % IV SOLN
500.0000 mg | Freq: Once | INTRAVENOUS | Status: AC
  Administered 2017-09-06: 500 mg via INTRAVENOUS
  Filled 2017-09-06: qty 500

## 2017-09-06 NOTE — ED Provider Notes (Addendum)
[facility] EMERGENCY DEPARTMENT Provider Note   CSN: [id number] Arrival date & time: 09/06/17  1812     History   Chief Complaint Chief Complaint  Patient presents with  . Shortness of Breath    HPI [name] is a 67 y.o. male.  Chief complaint is weakness, shortness of breath, chills.  HPI: 67 year old male.  History of asthma, not as apparent diabetes, grade 1 diastolic dysfunction not on medications, hypertension, previous non-Hodgkin's lymphoma status post autologous bone marrow transplant, not immune suppressed.  He describes a cough for 2 weeks.  He did receive the Prevnar vaccine, as well as seasonal influenza vaccine.  He had community acquired pneumonia last fall and was hospitalized for a short time and recovered well.  Son encouraged him to come in today because of weakness and difficulty doing ADLs or even getting from room to room at home secondary to dyspnea.  Chills.  Was not aware of fever.  No pain.  No GI complaints.  Past Medical History:  Diagnosis Date  . Asthma   . Diabetes mellitus without complication ([place])   . DLBCL (diffuse large B cell lymphoma) ([place]) 02/28/2009  . Edema   . Heart failure, diastolic, chronic ([place])    patient denies  . Hyperlipidemia, mixed   . Hypertension   . IDA (iron deficiency anemia)   . Large cell lymphoma ([place]) 01/2005   autologous stem cell transplant 12/2005  . Obesity, morbid (more than 100 lbs over ideal weight or BMI > 40) (HCC)   . Venous insufficiency 04/29/2011    Patient Active Problem List   Diagnosis Date Noted  . Acute respiratory failure with hypoxia ([place]) 05/19/2017  . Lobar pneumonia ([place]) 05/19/2017  . Sepsis due to undetermined organism ([place]) 05/19/2017  . AKI (acute kidney injury) ([place])   . Sepsis due to pneumonia ([place]) 05/18/2017  . Acute renal failure (ARF) ([place]) 05/18/2017  . Diabetes ([place]) 02/19/2017  . Essential hypertension 04/03/2015  . IDA (iron deficiency anemia)   . Mucosal abnormality  of stomach   . History of colonic polyps   . Hx of adenomatous colonic polyps 06/13/2012  . Venous insufficiency 04/29/2011  . DLBCL (diffuse large B cell lymphoma) ([place]) 02/28/2009  . HYPERLIPIDEMIA-MIXED 02/28/2009  . OBESITY-MORBID (>100') 02/28/2009  . DIASTOLIC HEART FAILURE, CHRONIC 02/28/2009  . EDEMA 02/28/2009    Past Surgical History:  Procedure Laterality Date  . BACK SURGERY  2006   T6 vertebrae removed/titanium placed  . COLONOSCOPY  11/24/2004   Polyps in the left colon ablated/removed as described above.  Two submucosal lesions consistent with lipomas as described above not  manipulated./ Normal rectum  . COLONOSCOPY  06/20/2012   MULTIPLE RECTAL AND COLONIC POLYPS  . COLONOSCOPY N/A 03/08/2015   RMR: Capacious, redundant colon. Multiple colonic and rectosigmoid polyps removed. ablated as described above. colonic lipoma abnormal appearing terminal ileum likely a variant of normal). Howeverwith history  biopsies obtained.   . ESOPHAGOGASTRODUODENOSCOPY N/A 03/08/2015   RMR: Hiatal hernia Polypoid gastric mucosa with multiple gastric polyps. largest polyp removed via snare polypectomgy hemostasis clip placed at base. Status post gastric biopsy. Status post video capsule placement.   Marland Kitchen GIVENS CAPSULE STUDY N/A 03/08/2015   Procedure: GIVENS CAPSULE STUDY;  Surgeon: Daneil Dolin, MD;  Location[facility];  Service: Endoscopy;  Laterality: N/A;  . LIMBAL STEM CELL TRANSPLANT    . LUNG BIOPSY  6/06  . MULTIPLE TOOTH EXTRACTIONS  04/2005  . port a cath  placement    . PORT-A-CATH REMOVAL  09/08/2012   Procedure: REMOVAL PORT-A-CATH;  Surgeon: Melrose Nakayama, MD;  Location: [facility] OR;  Service: Thoracic;  Laterality: N/A;       Home Medications    Prior to Admission medications   Medication Sig Start Date End Date Taking? Authorizing Provider  glimepiride (AMARYL) 1 MG tablet take 1/2 tablet by mouth once daily WITH BREAKFAST 06/04/17  Yes Renato Shin, MD    ibuprofen (ADVIL,MOTRIN) 200 MG tablet Take 200 mg by mouth daily as needed for headache.    Yes [provider]  metoprolol tartrate (LOPRESSOR) 25 MG tablet Take 0.5 tablets (12.5 mg total) by mouth 2 (two) times daily. 05/21/17  Yes TatShanon Brow, MD    Family History Family History  Problem Relation Age of Onset  . Cancer Mother        lung  . Cancer Father        prostate  . Colon cancer Neg Hx   . Diabetes Neg Hx     Social History Social History   Tobacco Use  . Smoking status: Former Smoker    Packs/day: 0.50    Years: 15.00    Pack years: 7.50    Last attempt to quit: 11/11/2002    Years since quitting: 14.8  . Smokeless tobacco: Never Used  Substance Use Topics  . Alcohol use: No    Alcohol/week: 0.0 oz  . Drug use: No     Allergies   Penicillins   Review of Systems Review of Systems  Constitutional: Positive for chills and fatigue. Negative for appetite change, diaphoresis and fever.  HENT: Negative for mouth sores, sore throat and trouble swallowing.   Eyes: Negative for visual disturbance.  Respiratory: Positive for cough and shortness of breath. Negative for chest tightness and wheezing.   Cardiovascular: Negative for chest pain.  Gastrointestinal: Negative for abdominal distention, abdominal pain, diarrhea, nausea and vomiting.  Endocrine: Negative for polydipsia, polyphagia and polyuria.  Genitourinary: Negative for dysuria, frequency and hematuria.  Musculoskeletal: Negative for gait problem.  Skin: Negative for color change, pallor and rash.  Neurological: Positive for weakness. Negative for dizziness, syncope, light-headedness and headaches.  Hematological: Does not bruise/bleed easily.  Psychiatric/Behavioral: Negative for behavioral problems and confusion.     Physical Exam Updated Vital Signs BP 137/77   Pulse 96   Temp 99 F (37.2 C) (Oral)   Resp (!) 29   Ht 5\' 11"  (1.803 m)   Wt 126.1 kg (278 lb)   SpO2 95%   BMI 38.77  kg/m   Physical Exam  Constitutional: He is oriented to person, place, and time. He appears well-developed and well-nourished. No distress.  Awake and alert.  Appears ill.  Mentating well.  Dyspneic with conversation.  HENT:  Head: Normocephalic.  Eyes: Conjunctivae are normal. Pupils are equal, round, and reactive to light. No scleral icterus.  Neck: Normal range of motion. Neck supple. No thyromegaly present.  Cardiovascular: Regular rhythm. Tachycardia present. Exam reveals no gallop and no friction rub.  No murmur heard. [place] tachycardia 108.  Pulmonary/Chest: Effort normal and breath sounds normal. No respiratory distress. He has no wheezes. He has no rales.      Abdominal: Soft. Bowel sounds are normal. He exhibits no distension. There is no tenderness. There is no rebound.  Musculoskeletal: Normal range of motion.  Neurological: He is alert and oriented to person, place, and time.  Skin: Skin is warm and dry. No rash noted.  Symmetric lower extremity edema.  Psychiatric: He has a normal mood and affect. His behavior is normal.     ED Treatments / Results  Labs (all labs ordered are listed, but only abnormal results are displayed) Labs Reviewed  URINALYSIS, ROUTINE W REFLEX MICROSCOPIC - Abnormal; Notable for the following components:      Result Value   Protein, ur 100 (*)    Squamous Epithelial / LPF 0-5 (*)    All other components within normal limits  I-STAT CG4 LACTIC ACID, ED - Abnormal; Notable for the following components:   Lactic Acid, Venous 2.22 (*)    All other components within normal limits  CULTURE, BLOOD (ROUTINE X 2)  CULTURE, BLOOD (ROUTINE X 2)  COMPREHENSIVE METABOLIC PANEL  CBC WITH DIFFERENTIAL/PLATELET  LACTIC ACID, PLASMA  LACTIC ACID, PLASMA  I-STAT CG4 LACTIC ACID, ED  I-STAT CG4 LACTIC ACID, ED    EKG  EKG Interpretation None       Radiology Dg Chest 2 View  Result Date: 09/06/2017 CLINICAL DATA:  Cough fever and short of  breath EXAM: CHEST  2 VIEW COMPARISON:  CT chest 05/18/2017, chest radiograph 05/18/2017 FINDINGS: Suspected small pleural effusion. Multifocal airspace opacities within the bilateral lung bases and left mid lung. Stable cardiomegaly with vascular congestion. No pneumothorax. Surgical changes of the thoracic spine. IMPRESSION: Suspected small pleural effusions with multifocal bilateral airspace opacities suspicious for pneumonia. Radiographic follow-up to resolution is recommended Mild cardiomegaly with vascular congestion Electronically Signed   By: Donavan Foil M.D.   On: 09/06/2017 19:13    Procedures Procedures (including critical care time)  Medications Ordered in ED Medications  acetaminophen (TYLENOL) 500 MG tablet (not administered)  sodium chloride 0.9 % bolus 1,000 mL (0 mLs Intravenous Stopped 09/06/17 2049)    And  sodium chloride 0.9 % bolus 1,000 mL (0 mLs Intravenous Stopped 09/06/17 2200)    And  sodium chloride 0.9 % bolus 1,000 mL (1,000 mLs Intravenous New Bag/Given 09/06/17 2200)    And  sodium chloride 0.9 % bolus 1,000 mL (not administered)  acetaminophen (TYLENOL) tablet 1,000 mg (1,000 mg Oral Given 09/06/17 1851)  cefTRIAXone (ROCEPHIN) 1 g in dextrose 5 % 50 mL IVPB (0 g Intravenous Stopped 09/06/17 2038)  azithromycin (ZITHROMAX) 500 mg in dextrose 5 % 250 mL IVPB (0 mg Intravenous Stopped 09/06/17 2201)     Initial Impression / Assessment and Plan / ED Course  I have reviewed the triage vital signs and the nursing notes.  Pertinent labs & imaging results that were available during my care of the patient were reviewed by me and considered in my medical decision making (see chart for details).    Sirs criteria.  Tachycardic.  Not hypoxemic.  Elevated lactate.  Clinical, and radiographical pneumonia.  Community-acquired.  Given Rocephin, and Zithromax.  He has penicillin allergy but tolerated Rocephin and Vantin last fall.  Plan sepsis fluids, sepsis alert.  Will  reevaluate.  Serial lactates.  Probable admission.  CRITICAL CARE Performed by: Lolita Patella   Total critical care time: 30 minutes  Critical care time was exclusive of separately billable procedures and treating other patients.  Critical care was necessary to treat or prevent imminent or life-threatening deterioration.  Critical care was time spent personally by me on the following activities: development of treatment plan with patient and/or surrogate as well as nursing, discussions with consultants, evaluation of patient's response to treatment, examination of patient, obtaining history from patient or surrogate, ordering and performing  treatments and interventions, ordering and review of laboratory studies, ordering and review of radiographic studies, pulse oximetry and re-evaluation of patient's condition.   Final Clinical Impressions(s) / ED Diagnoses   Final diagnoses:  Community acquired pneumonia, unspecified laterality      ED Discharge Orders    None     22:20: Heart rate 98.  Saturation 93-95%.  States he still feels quite weak.  Still dyspneic with conversation.  Lactate is cleared to 1.0.  Gust with hospitalist regarding admission.  Although his numbers have improved he still is tachypneic, and dyspneic with conversation   Tanna Furry, MD 09/06/17 2220    Tanna Furry, MD 09/25/17 [phone]

## 2017-09-06 NOTE — H&P (Signed)
TRH H&P    Patient Demographics:    [name], is a 67 y.o. male  MRN: [id number]  DOB - Jan 05, 1951  Admit Date - 09/06/2017  Referring MD/NP/PA:  Dr. Jeneen Rinks  Outpatient Primary MD for the patient is Redmond School, MD  Patient coming from:  home  Chief Complaint  Patient presents with  . Shortness of Breath      HPI:    [name]  is a 66 y.o. male, with history of diabetes mellitus, hypertension, non-Hodgkin lymphoma status post autologous bone marrow transplant in 2007, hypertension, hyperlipidemia came to hospital with two-week history of shortness of breath, cough, fever. Patient also was coughing up yellow colored phlegm. Patient says that because of increased generalized weakness he came to ED for further evaluation.  In the ED patient was found to be febrile with temperature 102.9. Chest x-ray showed multifocal bilateral airspace opacity suspicious for pneumonia. Lactic acid was 1.8, went up to 2.22 and then 1.0 blood cultures time to obtain in the ED. Patient started on ceftriaxone and Zithromax.  He admits to having nausea with retching but no vomiting. Had one episode of loose bowel movement which resolved yesterday. Denies chest pain no dysuria or urgency of urination denies blurred vision or passing out.   Review of systems:      All other systems reviewed and are negative.   With Past History of the following :    Past Medical History:  Diagnosis Date  . Asthma   . Diabetes mellitus without complication ([place])   . DLBCL (diffuse large B cell lymphoma) ([place]) 02/28/2009  . Edema   . Heart failure, diastolic, chronic ([place])    patient denies  . Hyperlipidemia, mixed   . Hypertension   . IDA (iron deficiency anemia)   . Large cell lymphoma ([place]) 01/2005   autologous stem cell transplant 12/2005  . Obesity, morbid (more than 100 lbs over ideal weight or BMI > 40) (HCC)   .  Venous insufficiency 04/29/2011      Past Surgical History:  Procedure Laterality Date  . BACK SURGERY  2006   T6 vertebrae removed/titanium placed  . COLONOSCOPY  11/24/2004   Polyps in the left colon ablated/removed as described above.  Two submucosal lesions consistent with lipomas as described above not  manipulated./ Normal rectum  . COLONOSCOPY  06/20/2012   MULTIPLE RECTAL AND COLONIC POLYPS  . COLONOSCOPY N/A 03/08/2015   RMR: Capacious, redundant colon. Multiple colonic and rectosigmoid polyps removed. ablated as described above. colonic lipoma abnormal appearing terminal ileum likely a variant of normal). Howeverwith history  biopsies obtained.   . ESOPHAGOGASTRODUODENOSCOPY N/A 03/08/2015   RMR: Hiatal hernia Polypoid gastric mucosa with multiple gastric polyps. largest polyp removed via snare polypectomgy hemostasis clip placed at base. Status post gastric biopsy. Status post video capsule placement.   Marland Kitchen GIVENS CAPSULE STUDY N/A 03/08/2015   Procedure: GIVENS CAPSULE STUDY;  Surgeon: Daneil Dolin, MD;  Location[facility];  Service: Endoscopy;  Laterality: N/A;  . LIMBAL STEM CELL TRANSPLANT    .  LUNG BIOPSY  6/06  . MULTIPLE TOOTH EXTRACTIONS  04/2005  . port a cath placement    . PORT-A-CATH REMOVAL  09/08/2012   Procedure: REMOVAL PORT-A-CATH;  Surgeon: Melrose Nakayama, MD;  Location: [facility] OR;  Service: Thoracic;  Laterality: N/A;      Social History:      Social History   Tobacco Use  . Smoking status: Former Smoker    Packs/day: 0.50    Years: 15.00    Pack years: 7.50    Last attempt to quit: 11/11/2002    Years since quitting: 14.8  . Smokeless tobacco: Never Used  Substance Use Topics  . Alcohol use: No    Alcohol/week: 0.0 oz       Family History :     Family History  Problem Relation Age of Onset  . Cancer Mother        lung  . Cancer Father        prostate  . Colon cancer Neg Hx   . Diabetes Neg Hx       Home Medications:   Prior  to Admission medications   Medication Sig Start Date End Date Taking? Authorizing Provider  glimepiride (AMARYL) 1 MG tablet take 1/2 tablet by mouth once daily WITH BREAKFAST 06/04/17  Yes Renato Shin, MD  ibuprofen (ADVIL,MOTRIN) 200 MG tablet Take 200 mg by mouth daily as needed for headache.    Yes [provider]  metoprolol tartrate (LOPRESSOR) 25 MG tablet Take 0.5 tablets (12.5 mg total) by mouth 2 (two) times daily. 05/21/17  Yes Orson Eva, MD     Allergies:     Allergies  Allergen Reactions  . Penicillins Rash    Has patient had a PCN reaction causing immediate rash, facial/tongue/throat swelling, SOB or lightheadedness with hypotension: Unknown Has patient had a PCN reaction causing severe rash involving mucus membranes or skin necrosis: Unknown Has patient had a PCN reaction that required hospitalization: Unknown Has patient had a PCN reaction occurring within the last 10 years: No If all of the above answers are "NO", then may proceed with Cephalosporin use.      Physical Exam:   Vitals  Blood pressure 134/80, pulse 88, temperature 99 F (37.2 C), temperature source Oral, resp. rate (!) 28, height 5\' 11"  (1.803 m), weight 126.1 kg (278 lb), SpO2 92 %.  1.  General: appears in no acute distress  2. Psychiatric:  Intact judgement and  insight, awake alert, oriented x 3.  3. Neurologic: No focal neurological deficits, all cranial nerves intact.Strength 5/5 all 4 extremities, sensation intact all 4 extremities, plantars down going.  4. Eyes :  anicteric sclerae, moist conjunctivae with no lid lag. PERRLA.  5. ENMT:  Oropharynx clear with moist mucous membranes and good dentition  6. Neck:  supple, no cervical lymphadenopathy appriciated, No thyromegaly  7. Respiratory : Normal respiratory effort, bibasilar  crackles left more than right  8. Cardiovascular : RRR, no gallops, rubs or murmurs, no leg edema  9. Gastrointestinal:  Positive bowel  sounds, abdomen soft, non-tender to palpation,no hepatosplenomegaly, no rigidity or guarding       10. Skin:  No cyanosis, normal texture and turgor, no rash, lesions or ulcers  11.Musculoskeletal:  Good muscle tone,  joints appear normal , no effusions,  normal range of motion    Data Review:    CBC Recent Labs  Lab 09/06/17 1910  WBC 5.7  HGB 14.5  HCT 43.5  PLT 166  MCV 91.8  MCH 30.6  MCHC 33.3  RDW 15.0  LYMPHSABS 0.7  MONOABS 0.7  EOSABS 0.1  BASOSABS 0.0   ------------------------------------------------------------------------------------------------------------------  Chemistries  Recent Labs  Lab 09/06/17 1910  NA 137  K 4.4  CL 103  CO2 23  GLUCOSE 94  BUN 10  CREATININE 1.04  CALCIUM 9.2  AST 30  ALT 21  ALKPHOS 73  BILITOT 1.0   ------------------------------------------------------------------------------------------------------------------  ------------------------------------------------------------------------------------------------------------------ GFR: Estimated Creatinine Clearance: 95.8 mL/min (by C-G formula based on SCr of 1.04 mg/dL). Liver Function Tests: Recent Labs  Lab 09/06/17 1910  AST 30  ALT 21  ALKPHOS 73  BILITOT 1.0  PROT 7.4  ALBUMIN 3.8   No results for input(s): LIPASE, AMYLASE in the last 168 hours. No results for input(s): AMMONIA in the last 168 hours. Coagulation Profile: No results for input(s): INR, PROTIME in the last 168 hours. Cardiac Enzymes: No results for input(s): CKTOTAL, CKMB, CKMBINDEX, TROPONINI in the last 168 hours. BNP (last 3 results) No results for input(s): PROBNP in the last 8760 hours. HbA1C: No results for input(s): HGBA1C in the last 72 hours. CBG: No results for input(s): GLUCAP in the last 168 hours. Lipid Profile: No results for input(s): CHOL, HDL, LDLCALC, TRIG, CHOLHDL, LDLDIRECT in the last 72 hours. Thyroid Function Tests: No results for input(s): TSH, T4TOTAL,  FREET4, T3FREE, THYROIDAB in the last 72 hours. Anemia Panel: No results for input(s): VITAMINB12, FOLATE, FERRITIN, TIBC, IRON, RETICCTPCT in the last 72 hours.  --------------------------------------------------------------------------------------------------------------- Urine analysis:    Component Value Date/Time   COLORURINE YELLOW 09/06/2017 2119   APPEARANCEUR CLEAR 09/06/2017 2119   LABSPEC 1.021 09/06/2017 2119   PHURINE 5.0 09/06/2017 2119   GLUCOSEU NEGATIVE 09/06/2017 2119   HGBUR NEGATIVE 09/06/2017 2119   [place] NEGATIVE 09/06/2017 2119   [place] NEGATIVE 09/06/2017 2119   PROTEINUR 100 (A) 09/06/2017 2119   NITRITE NEGATIVE 09/06/2017 2119   LEUKOCYTESUR NEGATIVE 09/06/2017 2119      Imaging Results:    Dg Chest 2 View  Result Date: 09/06/2017 CLINICAL DATA:  Cough fever and short of breath EXAM: CHEST  2 VIEW COMPARISON:  CT chest 05/18/2017, chest radiograph 05/18/2017 FINDINGS: Suspected small pleural effusion. Multifocal airspace opacities within the bilateral lung bases and left mid lung. Stable cardiomegaly with vascular congestion. No pneumothorax. Surgical changes of the thoracic spine. IMPRESSION: Suspected small pleural effusions with multifocal bilateral airspace opacities suspicious for pneumonia. Radiographic follow-up to resolution is recommended Mild cardiomegaly with vascular congestion Electronically Signed   By: Donavan Foil M.D.   On: 09/06/2017 19:13    My personal review of EKG: Rhythm -irregular rhythm,? Atrial fibrillation   Assessment & Plan:    Active Problems:   DLBCL (diffuse large B cell lymphoma) (HCC)   DIASTOLIC HEART FAILURE, CHRONIC   Essential hypertension   Diabetes ([place])   CAP (community acquired pneumonia)   1. Community acquired pneumonia-will admit the patient, initiate pneumonia protocol. Ceftriaxone and Zithromax started. Will obtain urinary strep pneumo antigen, follow blood culture results.  2. Diabetes  mellitus-hold Glucotrol, start sliding scale insulin with NovoLog. Check CBG QAC &HS. 3. Hypertension-blood pressure is stable, continue metoprolol. 4. History of non-Hodgkin lymphoma-status post autologous bone marrow transplantation in 2007.  5. Chronic diastolic CHF- stable, echocardiogram from 2016 showed EF 55 to 64%, grade 1 diastolic dysfunction. 6. Abnormal EKG- EKG showed  ? irregular rhythm, no previous history of atrial fibrillation. Will repeat EKG.   DVT Prophylaxis-   Lovenox  AM Labs Ordered, also please review Full Orders  Family Communication: Admission, patients condition and plan of care including tests being ordered have been discussed with the patient  who indicate understanding and agree with the plan and Code Status.  Code Status: full code  Admission status:  inpatient  Time spent in minutes :  60 minutes   Oswald Hillock M.D on 09/06/2017 at 11:01 PM  Between 7am to 7pm - Pager - [phone]. After 7pm go to [email or web address] - password [facility]  [facility] - Office  [phone]

## 2017-09-06 NOTE — ED Notes (Addendum)
Critical result. Lactic acid 2.22. MD Jeneen Rinks notified.

## 2017-09-06 NOTE — ED Notes (Signed)
Gave pt urinal & informed him we need urine specimen.

## 2017-09-06 NOTE — ED Triage Notes (Signed)
Pt c/o sob 2 weeks. Prod cough with yellow sputum.

## 2017-09-06 NOTE — ED Notes (Signed)
Dr Jeneen Rinks aware of pt

## 2017-09-07 ENCOUNTER — Inpatient Hospital Stay (HOSPITAL_COMMUNITY): Payer: Medicare Other

## 2017-09-07 ENCOUNTER — Other Ambulatory Visit: Payer: Self-pay

## 2017-09-07 DIAGNOSIS — R6520 Severe sepsis without septic shock: Secondary | ICD-10-CM | POA: Diagnosis not present

## 2017-09-07 DIAGNOSIS — C8330 Diffuse large B-cell lymphoma, unspecified site: Secondary | ICD-10-CM

## 2017-09-07 DIAGNOSIS — R0600 Dyspnea, unspecified: Secondary | ICD-10-CM | POA: Diagnosis not present

## 2017-09-07 DIAGNOSIS — R279 Unspecified lack of coordination: Secondary | ICD-10-CM | POA: Diagnosis not present

## 2017-09-07 DIAGNOSIS — I10 Essential (primary) hypertension: Secondary | ICD-10-CM | POA: Diagnosis not present

## 2017-09-07 DIAGNOSIS — Z86010 Personal history of colonic polyps: Secondary | ICD-10-CM | POA: Diagnosis not present

## 2017-09-07 DIAGNOSIS — Z9981 Dependence on supplemental oxygen: Secondary | ICD-10-CM | POA: Diagnosis not present

## 2017-09-07 DIAGNOSIS — I4892 Unspecified atrial flutter: Secondary | ICD-10-CM

## 2017-09-07 DIAGNOSIS — E872 Acidosis: Secondary | ICD-10-CM | POA: Diagnosis not present

## 2017-09-07 DIAGNOSIS — A419 Sepsis, unspecified organism: Principal | ICD-10-CM

## 2017-09-07 DIAGNOSIS — E1122 Type 2 diabetes mellitus with diabetic chronic kidney disease: Secondary | ICD-10-CM | POA: Diagnosis not present

## 2017-09-07 DIAGNOSIS — J9601 Acute respiratory failure with hypoxia: Secondary | ICD-10-CM

## 2017-09-07 DIAGNOSIS — I5032 Chronic diastolic (congestive) heart failure: Secondary | ICD-10-CM | POA: Diagnosis not present

## 2017-09-07 DIAGNOSIS — Z880 Allergy status to penicillin: Secondary | ICD-10-CM | POA: Diagnosis not present

## 2017-09-07 DIAGNOSIS — Z7984 Long term (current) use of oral hypoglycemic drugs: Secondary | ICD-10-CM | POA: Diagnosis not present

## 2017-09-07 DIAGNOSIS — I110 Hypertensive heart disease with heart failure: Secondary | ICD-10-CM | POA: Diagnosis not present

## 2017-09-07 DIAGNOSIS — Z5189 Encounter for other specified aftercare: Secondary | ICD-10-CM | POA: Diagnosis not present

## 2017-09-07 DIAGNOSIS — J189 Pneumonia, unspecified organism: Secondary | ICD-10-CM

## 2017-09-07 DIAGNOSIS — Z801 Family history of malignant neoplasm of trachea, bronchus and lung: Secondary | ICD-10-CM | POA: Diagnosis not present

## 2017-09-07 DIAGNOSIS — R0602 Shortness of breath: Secondary | ICD-10-CM | POA: Diagnosis not present

## 2017-09-07 DIAGNOSIS — R262 Difficulty in walking, not elsewhere classified: Secondary | ICD-10-CM | POA: Diagnosis not present

## 2017-09-07 DIAGNOSIS — Z6838 Body mass index (BMI) 38.0-38.9, adult: Secondary | ICD-10-CM | POA: Diagnosis not present

## 2017-09-07 DIAGNOSIS — Z8572 Personal history of non-Hodgkin lymphomas: Secondary | ICD-10-CM | POA: Diagnosis not present

## 2017-09-07 DIAGNOSIS — Z9481 Bone marrow transplant status: Secondary | ICD-10-CM | POA: Diagnosis not present

## 2017-09-07 DIAGNOSIS — E785 Hyperlipidemia, unspecified: Secondary | ICD-10-CM | POA: Diagnosis not present

## 2017-09-07 DIAGNOSIS — Z87891 Personal history of nicotine dependence: Secondary | ICD-10-CM | POA: Diagnosis not present

## 2017-09-07 DIAGNOSIS — M6281 Muscle weakness (generalized): Secondary | ICD-10-CM | POA: Diagnosis not present

## 2017-09-07 DIAGNOSIS — E119 Type 2 diabetes mellitus without complications: Secondary | ICD-10-CM | POA: Diagnosis not present

## 2017-09-07 DIAGNOSIS — E782 Mixed hyperlipidemia: Secondary | ICD-10-CM | POA: Diagnosis present

## 2017-09-07 DIAGNOSIS — Z8042 Family history of malignant neoplasm of prostate: Secondary | ICD-10-CM | POA: Diagnosis not present

## 2017-09-07 DIAGNOSIS — N183 Chronic kidney disease, stage 3 (moderate): Secondary | ICD-10-CM | POA: Diagnosis not present

## 2017-09-07 LAB — COMPREHENSIVE METABOLIC PANEL
ALBUMIN: 3.2 g/dL — AB (ref 3.5–5.0)
ALT: 15 U/L — ABNORMAL LOW (ref 17–63)
ANION GAP: 10 (ref 5–15)
AST: 17 U/L (ref 15–41)
Alkaline Phosphatase: 57 U/L (ref 38–126)
BILIRUBIN TOTAL: 0.7 mg/dL (ref 0.3–1.2)
BUN: 9 mg/dL (ref 6–20)
CHLORIDE: 106 mmol/L (ref 101–111)
CO2: 20 mmol/L — AB (ref 22–32)
Calcium: 8.3 mg/dL — ABNORMAL LOW (ref 8.9–10.3)
Creatinine, Ser: 0.9 mg/dL (ref 0.61–1.24)
GFR calc Af Amer: >60 mL/min (ref >60)
GFR calc non Af Amer: >60 mL/min (ref >60)
GLUCOSE: 96 mg/dL (ref 65–99)
POTASSIUM: 3.6 mmol/L (ref 3.5–5.1)
SODIUM: 136 mmol/L (ref 135–145)
TOTAL PROTEIN: 6.1 g/dL — AB (ref 6.5–8.1)

## 2017-09-07 LAB — CBC
HCT: 38.3 % — ABNORMAL LOW (ref 39.0–52.0)
Hemoglobin: 12.5 g/dL — ABNORMAL LOW (ref 13.0–17.0)
MCH: 30.1 pg (ref 26.0–34.0)
MCHC: 32.6 g/dL (ref 30.0–36.0)
MCV: 92.3 fL (ref 78.0–100.0)
PLATELETS: 160 10*3/uL (ref 150–400)
RBC: 4.15 MIL/uL — ABNORMAL LOW (ref 4.22–5.81)
RDW: 15.2 % (ref 11.5–15.5)
WBC: 4.4 10*3/uL (ref 4.0–10.5)

## 2017-09-07 LAB — STREP PNEUMONIAE URINARY ANTIGEN: Strep Pneumo Urinary Antigen: NEGATIVE

## 2017-09-07 LAB — HEMOGLOBIN A1C
Hgb A1c MFr Bld: 5.3 % (ref 4.8–5.6)
MEAN PLASMA GLUCOSE: 105.41 mg/dL

## 2017-09-07 LAB — ECHOCARDIOGRAM COMPLETE
HEIGHTINCHES: 71 in
WEIGHTICAEL: 4443.2 [oz_av]

## 2017-09-07 LAB — GLUCOSE, CAPILLARY
GLUCOSE-CAPILLARY: 88 mg/dL (ref 65–99)
GLUCOSE-CAPILLARY: 75 mg/dL (ref 65–99)
Glucose-Capillary: 75 mg/dL (ref 65–99)
Glucose-Capillary: 86 mg/dL (ref 65–99)
Glucose-Capillary: 91 mg/dL (ref 65–99)

## 2017-09-07 MED ORDER — METOPROLOL TARTRATE 25 MG PO TABS
12.5000 mg | ORAL_TABLET | Freq: Two times a day (BID) | ORAL | Status: DC
  Administered 2017-09-07 (×2): 12.5 mg via ORAL
  Filled 2017-09-07 (×2): qty 1

## 2017-09-07 MED ORDER — PERFLUTREN LIPID MICROSPHERE
1.0000 mL | INTRAVENOUS | Status: AC | PRN
  Administered 2017-09-07: 2 mL via INTRAVENOUS
  Filled 2017-09-07: qty 10

## 2017-09-07 MED ORDER — IPRATROPIUM-ALBUTEROL 0.5-2.5 (3) MG/3ML IN SOLN
3.0000 mL | Freq: Four times a day (QID) | RESPIRATORY_TRACT | Status: DC | PRN
  Administered 2017-09-07 – 2017-09-09 (×2): 3 mL via RESPIRATORY_TRACT
  Filled 2017-09-07 (×2): qty 3

## 2017-09-07 MED ORDER — APIXABAN 5 MG PO TABS
5.0000 mg | ORAL_TABLET | Freq: Two times a day (BID) | ORAL | Status: DC
  Administered 2017-09-07 – 2017-09-10 (×7): 5 mg via ORAL
  Filled 2017-09-07: qty 2
  Filled 2017-09-07: qty 1
  Filled 2017-09-07: qty 2
  Filled 2017-09-07 (×4): qty 1

## 2017-09-07 NOTE — Progress Notes (Signed)
*  PRELIMINARY RESULTS* Echocardiogram 2D Echocardiogram with definity has been performed.  Leavy Cella 09/07/2017, 3:20 PM

## 2017-09-07 NOTE — Progress Notes (Signed)
Pt ambulated in hallway but was very short of breath with exertion. Pt had to be placed on 2L oxygen. O2 saturation now 100% however I was informed by central telemetry that pt became very tachycardic during ambulation with HR in 140's. MD has been notified. Will continue to monitor patient.

## 2017-09-07 NOTE — Progress Notes (Signed)
SATURATION QUALIFICATIONS: (This note is used to comply with regulatory documentation for home oxygen)  Patient Saturations on Room Air at Rest = 95 %  Patient Saturations on Room Air while Ambulating = 88 %  Patient Saturations on 2 Liters of oxygen while Ambulating = 100 %  Please briefly explain why patient needs home oxygen:

## 2017-09-07 NOTE — Consult Note (Signed)
Cardiology Consult    Patient ID: [name]; [id number]; 04/10/1951   Admit date: 09/06/2017 Date of Consult: 09/07/2017  Primary Care Provider: Redmond School, MD Primary Cardiologist: Dr. Bronson Ing   Patient Profile    [name] is a 67 y.o. male with past medical history of diffuse large B-cell lymphoma (in remission), HTN, and HLD, and Type 2 DM who is being seen today for the evaluation of new-onset atrial flutter at the request of Dr. Darrick Meigs.   History of Present Illness    [name] was last examined by Dr. Bronson Ing in 02/2015 for an abnormal EKG. He was noted to have frequent PVC's and an echocardiogram was obtained which showed a preserved EF of 55-60% with Grade 1 DD and no regional WMA. NST showed no significant ischemia or prior infarct, overall being a low-risk study.   He presented to [facility] ED on 09/06/2017 for evaluation of a productive cough and weakness over the past several days. He initially thought he just had a common cold but symptoms continued to worsen, therefore he proceeded to the ED for further evaluation. Was febrile to 103.9 while in the ED. Initial labs showed WBC of 5.7, Hgb 14.5, and platelets 166. Na+ 137, K+ 4.4, and creatinine 1.04. Lactic Acid 2.22. CXR showing small pleural effusions with multifocal bilateral airspace opacities suspicious for PNA. EKG showing an irregular tachycardia, HR 108, consistent with atrial flutter.   He was admitted for further treatment of his PNA and has been started on Zithromax and Rocephin. Cardiology is asked to see for new-onset atrial flutter.   He denies any recent chest pain, dyspnea on exertion, orthopnea, PND, or palpitations. Unaware of his irregular heart rhythm. Has already noticed improvement in his productive cough and weakness since being started on antibiotic therapy. Telemetry is reviewed and shows atrial flutter with HR well-controlled with rates in the 80's to 90's. Possible p-waves noted at  times.   Past Medical History:  Diagnosis Date  . Asthma   . Diabetes mellitus without complication ([place])   . DLBCL (diffuse large B cell lymphoma) ([place]) 02/28/2009  . Edema   . Heart failure, diastolic, chronic ([place])    patient denies  . Hyperlipidemia, mixed   . Hypertension   . IDA (iron deficiency anemia)   . Large cell lymphoma ([place]) 01/2005   autologous stem cell transplant 12/2005  . Obesity, morbid (more than 100 lbs over ideal weight or BMI > 40) ([facility])   . Venous insufficiency 04/29/2011    Past Surgical History:  Procedure Laterality Date  . BACK SURGERY  2006   T6 vertebrae removed/titanium placed  . COLONOSCOPY  11/24/2004   Polyps in the left colon ablated/removed as described above.  Two submucosal lesions consistent with lipomas as described above not  manipulated./ Normal rectum  . COLONOSCOPY  06/20/2012   MULTIPLE RECTAL AND COLONIC POLYPS  . COLONOSCOPY N/A 03/08/2015   RMR: Capacious, redundant colon. Multiple colonic and rectosigmoid polyps removed. ablated as described above. colonic lipoma abnormal appearing terminal ileum likely a variant of normal). Howeverwith history  biopsies obtained.   . ESOPHAGOGASTRODUODENOSCOPY N/A 03/08/2015   RMR: Hiatal hernia Polypoid gastric mucosa with multiple gastric polyps. largest polyp removed via snare polypectomgy hemostasis clip placed at base. Status post gastric biopsy. Status post video capsule placement.   Marland Kitchen GIVENS CAPSULE STUDY N/A 03/08/2015   Procedure: GIVENS CAPSULE STUDY;  Surgeon: Daneil Dolin, MD;  Location[facility];  Service:  Endoscopy;  Laterality: N/A;  . LIMBAL STEM CELL TRANSPLANT    . LUNG BIOPSY  6/06  . MULTIPLE TOOTH EXTRACTIONS  04/2005  . port a cath placement    . PORT-A-CATH REMOVAL  09/08/2012   Procedure: REMOVAL PORT-A-CATH;  Surgeon: Melrose Nakayama, MD;  Location: [place];  Service: Thoracic;  Laterality: N/A;     Home Medications:  Prior to Admission medications   Medication Sig  Start Date End Date Taking? Authorizing Provider  glimepiride (AMARYL) 1 MG tablet take 1/2 tablet by mouth once daily WITH BREAKFAST 06/04/17  Yes Renato Shin, MD  ibuprofen (ADVIL,MOTRIN) 200 MG tablet Take 200 mg by mouth daily as needed for headache.    Yes [provider]  metoprolol tartrate (LOPRESSOR) 25 MG tablet Take 0.5 tablets (12.5 mg total) by mouth 2 (two) times daily. 05/21/17  Yes TatShanon Brow, MD    Inpatient Medications: Scheduled Meds: . enoxaparin (LOVENOX) injection  40 mg Subcutaneous Q24H  . insulin aspart  0-9 Units Subcutaneous TID WC  . metoprolol tartrate  12.5 mg Oral BID   Continuous Infusions: . sodium chloride 75 mL/hr at 09/07/17 0017  . azithromycin    . cefTRIAXone (ROCEPHIN)  IV    . sodium chloride     PRN Meds: ipratropium-albuterol  Allergies:    Allergies  Allergen Reactions  . Penicillins Rash    Has patient had a PCN reaction causing immediate rash, facial/tongue/throat swelling, SOB or lightheadedness with hypotension: Unknown Has patient had a PCN reaction causing severe rash involving mucus membranes or skin necrosis: Unknown Has patient had a PCN reaction that required hospitalization: Unknown Has patient had a PCN reaction occurring within the last 10 years: No If all of the above answers are "NO", then may proceed with Cephalosporin use.     Social History:   Social History   Socioeconomic History  . Marital status: Married    Spouse name: Not on file  . Number of children: 2  . Years of education: Not on file  . Highest education level: Not on file  Social Needs  . Financial resource strain: Not on file  . Food insecurity - worry: Not on file  . Food insecurity - inability: Not on file  . Transportation needs - medical: Not on file  . Transportation needs - non-medical: Not on file  Occupational History  . Occupation: disability due to back    Employer: UNEMPLOYED  Tobacco Use  . Smoking status: Former Smoker     Packs/day: 0.50    Years: 15.00    Pack years: 7.50    Last attempt to quit: 11/11/2002    Years since quitting: 14.8  . Smokeless tobacco: Never Used  Substance and Sexual Activity  . Alcohol use: No    Alcohol/week: 0.0 oz  . Drug use: No  . Sexual activity: Not on file  Other Topics Concern  . Not on file  Social History Narrative   Married   No regular exercise     Family History:    Family History  Problem Relation Age of Onset  . Cancer Mother        lung  . Cancer Father        prostate  . Colon cancer Neg Hx   . Diabetes Neg Hx       Review of Systems    General:  No chills, fever, night sweats or weight changes.  Cardiovascular:  No chest pain, dyspnea on exertion,  edema, orthopnea, palpitations, paroxysmal nocturnal dyspnea. Dermatological: No rash, lesions/masses Respiratory: No dyspnea. Positive for productive cough.  Urologic: No hematuria, dysuria Abdominal:   No nausea, vomiting, diarrhea, bright red blood per rectum, melena, or hematemesis Neurologic:  No visual changes, changes in mental status. Positive for weakness. All other systems reviewed and are otherwise negative except as noted above.  Physical Exam/Data    Vitals:   09/06/17 2300 09/06/17 2357 09/07/17 0142 09/07/17 0441  BP: 125/80 (!) 146/64  (!) 142/79  Pulse: 88 88  91  Resp: (!) 34   20  Temp:  98.7 F (37.1 C)  99.5 F (37.5 C)  TempSrc:  Oral  Oral  SpO2: 94% 100% 91% 97%  Weight:  277 lb 11.2 oz (126 kg)    Height:  5\' 11"  (1.803 m)      Intake/Output Summary (Last 24 hours) at 09/07/2017 1041 Last data filed at 09/07/2017 0545 Gross per 24 hour  Intake 3603.75 ml  Output 600 ml  Net 3003.75 ml   Filed Weights   09/06/17 2005 09/06/17 2357  Weight: 278 lb (126.1 kg) 277 lb 11.2 oz (126 kg)   Body mass index is 38.73 kg/m.   General: Pleasant African American male appearing in NAD Psych: Normal affect. Neuro: Alert and oriented X 3. Moves all extremities  spontaneously. HEENT: Normal  Neck: Supple without bruits or JVD. Lungs:  Resp regular and unlabored, mild wheezing along upper lung fields bilaterally.  Heart: Irregularly irregular no s3, s4, or murmurs. Abdomen: Soft, non-tender, non-distended, BS + x 4.  Extremities: No clubbing or cyanosis. 1+ pitting edema with chronic xerosis. DP/PT/Radials 2+ and equal bilaterally.   EKG:  The EKG was personally reviewed and demonstrates: Irregular tachycardia, HR 108, consistent with atrial flutter.    Labs/Studies     Relevant CV Studies:  Echocardiogram: 03/2015 Study Conclusions  - Procedure narrative: Transthoracic echocardiography. Image   quality was fair. Endocardium was suboptimally visualized. - Left ventricle: The cavity size was normal. Wall thickness was   increased in a pattern of mild LVH. Systolic function was normal.   The estimated ejection fraction was in the range of 55% to 60%.   Images were inadequate for LV wall motion assessment. Doppler   parameters are consistent with abnormal left ventricular   relaxation (grade 1 diastolic dysfunction). - Ventricular septum: Septal motion showed abnormal function and   dyssynergy. These changes are consistent with intraventricular   conduction delay. - Left atrium: The atrium was mildly dilated  NST: 03/2015  There was no ST segment deviation noted during stress.  Defect 1: There is a small defect of moderate severity present in the basal inferior location. Due to soft tissue attenuation artifact.  This is a low risk study.  The left ventricular ejection fraction is normal (55-65%).  Laboratory Data:  Chemistry Recent Labs  Lab 09/06/17 1910 09/07/17 0421  NA 137 136  K 4.4 3.6  CL 103 106  CO2 23 20*  GLUCOSE 94 96  BUN 10 9  CREATININE 1.04 0.90  CALCIUM 9.2 8.3*  GFRNONAA >60 >60  GFRAA >60 >60  ANIONGAP 11 10    Recent Labs  Lab 09/06/17 1910 09/07/17 0421  PROT 7.4 6.1*  ALBUMIN 3.8 3.2*    AST 30 17  ALT 21 15*  ALKPHOS 73 57  BILITOT 1.0 0.7   Hematology Recent Labs  Lab 09/06/17 1910 09/07/17 0421  WBC 5.7 4.4  RBC 4.74 4.15*  HGB 14.5  12.5*  HCT 43.5 38.3*  MCV 91.8 92.3  MCH 30.6 30.1  MCHC 33.3 32.6  RDW 15.0 15.2  PLT 166 160   Cardiac EnzymesNo results for input(s): TROPONINI in the last 168 hours. No results for input(s): TROPIPOC in the last 168 hours.  BNPNo results for input(s): BNP, PROBNP in the last 168 hours.  DDimer No results for input(s): DDIMER in the last 168 hours.  Radiology/Studies:  Dg Chest 2 View  Result Date: 09/06/2017 CLINICAL DATA:  Cough fever and short of breath EXAM: CHEST  2 VIEW COMPARISON:  CT chest 05/18/2017, chest radiograph 05/18/2017 FINDINGS: Suspected small pleural effusion. Multifocal airspace opacities within the bilateral lung bases and left mid lung. Stable cardiomegaly with vascular congestion. No pneumothorax. Surgical changes of the thoracic spine. IMPRESSION: Suspected small pleural effusions with multifocal bilateral airspace opacities suspicious for pneumonia. Radiographic follow-up to resolution is recommended Mild cardiomegaly with vascular congestion Electronically Signed   By: Donavan Foil M.D.   On: 09/06/2017 19:13     Assessment & Plan    1. New-Onset Atrial Flutter - presented with worsening weakness and a productive cough, found to have multifocal PNA and started on antibiotic treatment. Initial EKG showed what appears to be most consistent with atrial flutter. Telemetry reviewed and it appears he has remained in atrial flutter with possible episodes MAT overnight as it is difficult to discern his flutter waves from possible p-waves at times. K+ WNL. Will check TSH and Mg. Atrial flutter likely secondary to his PNA but difficult to discern an exact starting date as he is asymptomatic with the arrhythmia. - will restart PTA Lopressor 12.5mg  BID for rate-control. HR has been mostly well-controlled in the  80's to 90's and we can further titrate this if needed.  - This patients CHA2DS2-VASc Score and unadjusted Ischemic Stroke Rate (% per year) is equal to 3.2 % stroke rate/year from a score of 3 (HTN, DM, Age). Discussed the indication for anticoagulation with the patient and his sister and he is in agreement to start a NOAC.   2. HTN - BP has been variable at 119/58 - 166/80 while admitted.  - will restart PTA Lopressor 12.5mg  BID.   3. Type 2 DM - Hgb A1c elevated to 8.2 in 04/2017. Repeat labs pending.  - on SSI while admitted.   4. Large B-cell lymphoma - s/p stem cell transplant. In remission according to the patient and followed by Oncology in the outpatient setting.  5. CAP -  has been started on Zithromax and Rocephin. - per admitting team.    For questions or updates, please contact [place] Please consult [email or web address] for contact info under Cardiology/STEMI.  Signed, Erma Heritage, PA-C 09/07/2017, 10:41 AM Pager: [phone]  The patient was seen and examined, and I agree with the history, physical exam, assessment and plan as documented above, with modifications as noted below. I have also personally reviewed all relevant documentation, old records, labs, and both radiographic and cardiovascular studies. I have also independently interpreted old and new ECG's.  Briefly, this is a 67 year old male who I last evaluated in July 2016 for an abnormal EKG.  He had frequent PVCs at that time.  Echocardiogram at that time demonstrated normal left ventricular systolic function, LVEF 69-62%, with grade 1 diastolic dysfunction and normal regional wall motion.  Nuclear stress test was normal.  Past medical history includes hypertension, type 2 diabetes, and large B-cell lymphoma status post bone marrow transplant.  He presented to  the [facility] emergency room yesterday with a productive cough and generalized weakness over the last several days.  He also had  progressive exertional dyspnea.  He was febrile on admission with a chest x-ray suspicious for pneumonia.  ECG which I personally interpreted demonstrated atrial flutter with variable conduction.  He has been started on azithromycin and ceftriaxone.  He denies chest pain, palpitations, orthopnea, and paroxysmal nocturnal dyspnea.  He is feeling better than he was yesterday.  He did try walking with his nurse and became tachycardic and short of breath.  We will restart low-dose metoprolol tartrate 12.5 mg twice daily for rate control.  His heart rate is controlled at rest but quickly accelerates with exertion, being driven by his multifocal pneumonia. CHADSVASC score is 3 thus anticoagulation is indicated for thromboembolic risk reduction.  I will review his echocardiogram to assess valvular apparatus.  I will likely start Eliquis 5 mg twice daily.   Kate Sable, MD, [facility]  09/07/2017 12:16 PM

## 2017-09-07 NOTE — Discharge Instructions (Addendum)
Information on my medicine - ELIQUIS (apixaban)  This medication education was reviewed with me or my healthcare representative as part of my discharge preparation.  Why was Eliquis prescribed for you? Eliquis was prescribed for you to reduce the risk of a blood clot forming that can cause a stroke if you have a medical condition called atrial fibrillation (a type of irregular heartbeat).  What do You need to know about Eliquis ? Take your Eliquis TWICE DAILY - one tablet in the morning and one tablet in the evening with or without food. If you have difficulty swallowing the tablet whole please discuss with your pharmacist how to take the medication safely.  Take Eliquis exactly as prescribed by your doctor and DO NOT stop taking Eliquis without talking to the doctor who prescribed the medication.  Stopping may increase your risk of developing a stroke.  Refill your prescription before you run out.  After discharge, you should have regular check-up appointments with your healthcare provider that is prescribing your Eliquis.  In the future your dose may need to be changed if your kidney function or weight changes by a significant amount or as you get older.  What do you do if you miss a dose? If you miss a dose, take it as soon as you remember on the same day and resume taking twice daily.  Do not take more than one dose of ELIQUIS at the same time to make up a missed dose.  Important Safety Information A possible side effect of Eliquis is bleeding. You should call your healthcare provider right away if you experience any of the following: ? Bleeding from an injury or your nose that does not stop. ? Unusual colored urine (red or dark brown) or unusual colored stools (red or black). ? Unusual bruising for unknown reasons. ? A serious fall or if you hit your head (even if there is no bleeding).  Some medicines may interact with Eliquis and might increase your risk of bleeding or  clotting while on Eliquis. To help avoid this, consult your healthcare provider or pharmacist prior to using any new prescription or non-prescription medications, including herbals, vitamins, non-steroidal anti-inflammatory drugs (NSAIDs) and supplements.  This website has more information on Eliquis (apixaban): http://www.eliquis.com/eliquis/home     Antibiotic Medicine, Adult Antibiotic medicines are used to treat infections caused by bacteria, such as strep throat and urinary tract infection (UTI). Antibiotic medicines will not work for viral illnesses, such as colds or the flu (influenza). They work by killing the bacteria that is making you sick. Antibiotics can also have serious side effects. It is important that you take antibiotic medicines safely and only when needed. When do I need to take antibiotics? Antibiotics are medicines that treat bacterial infections. You may need antibiotics for:  UTI.  Strep throat.  Meningitis. This infection affects the spinal cord and brain.  Bacterial sinusitis.  Serious lung infection.  You may start antibiotics while your health care provider waits for test results to come back. Common tests may include throat, urine, blood, or mucus culture. Your health care provider may change or stop the antibiotic depending on your test results. When are antibiotics not needed? You do not need antibiotics for most common illnesses. These illnesses may be caused by a virus, not a bacteria. You do not need antibiotics for:  The common cold.  Influenza.  Sore throat.  Discolored mucus.  Bronchitis.  Antibiotics are not always needed for all bacterial infections. Many of these  infections clear up without antibiotic treatment. Do not ask for or take antibiotics when they are not necessary. How long should I take the antibiotic? You must take the entire prescription. Continue to take your antibiotic for as long as told by your health care provider. Do  not stop taking it even if you start to feel better. If you stop taking it too soon:  You may start to feel sick again.  Your infection may become harder to treat.  Complications may develop.  Each course of antibiotics needs a different amount of time to work. Some antibiotic courses last only a few days. Some last about a week to 10 days. In some cases, you may need to take antibiotics for a few weeks to completely treat the infection. What if I miss a dose? Try not to miss any doses of medicine. If you miss a dose, call your health care provider or pharmacist for advice. Sometimes it is okay to take the missed dose as soon as possible. What are the risks of taking antibiotics? Most antibiotics can cause an infection called Clostridium difficile (C. difficile), which causes severe diarrhea. This infection happens when the antibiotics kill the healthy bacteria in your intestines. This allows C. difficile to grow. The infection needs to be treated right away. Let your health care provider know if:  You have diarrhea while taking an antibiotic.  You have diarrhea after you stop taking an antibiotic. C. difficile infection can start weeks after stopping the antibiotic.  Taking an antibiotic also puts you at risk for getting a bacteria that does not respond to medicine (antibiotic-resistant infection) in the future. Antibiotics can cause bacteria to change so that if the antibiotic is taken again, the medicine is not able to kill the bacteria. These infections can be more serious and, in some cases, life-threatening. Do antibiotics affect birth control? Birth control pills may not work while you are on antibiotics. If you are taking birth control pills, continue taking them as usual and use a second form of birth control, such as a condom, to avoid unwanted pregnancy. Continue using the second form of birth control until your health care provider says you can stop. What else should I know about  taking antibiotics? It is important for you to take antibiotics exactly as told. Make sure that you:  Take the entire course of antibiotic that was prescribed. Do not stop taking your antibiotics even if your symptoms improve.  Take the correct amount of medicine each day.  Ask your health care provider: ? How long to wait in between doses. ? If the antibiotic should be taken with food. ? If there are any foods, drinks, or medicines that you should avoid while taking the antibiotics. ? If there are any side effects you should be aware of.  Only use the antibiotics prescribed for you by your health care provider. Do not use antibiotics prescribed for someone else.  Drink a large glass of water along with the antibiotics.  Ask the pharmacist for a syringe, cup, or spoon that properly measures the antibiotics.  Throw away any leftover medicine.  Contact a health care provider if:  Your symptoms get worse.  You have new joint pain or muscle aches that begin after starting the antibiotic. When should I seek immediate medical care?  You have signs of a serious allergic reaction to antibiotics. If you have signs of a severe allergic reaction, stop taking the antibiotic right away. Signs may include: ?  Hives, which are raised, itchy, red bumps on the skin. ? Skin rash. ? Trouble breathing. ? A wheezing sound when you breathe. ? Swelling anywhere on your body. ? Feeling dizzy. ? Vomiting.  Your urine turns dark or becomes blood-colored.  Your skin turns yellow.  You bruise or bleed easily.  You have severe diarrhea and abdominal cramps.  You have a severe headache. Summary  Antibiotic medicines are used to treat infections caused by bacteria, such as strep throat and UTIs. It is important that you take antibiotic medicines only when needed.  Your health care provider may change or stop the antibiotic depending on your test results.  Most antibiotics can cause an infection  called Clostridium difficile (C. difficile), which causes severe diarrhea. Let your health care provider know if you develop diarrhea while taking an antibiotic.  Take the entire course of antibiotic that was prescribed. This information is not intended to replace advice given to you by your health care provider. Make sure you discuss any questions you have with your health care provider. Document Released: 04/22/2004 Document Revised: 08/11/2016 Document Reviewed: 08/11/2016 Elsevier Interactive Patient Education  [employer].

## 2017-09-07 NOTE — Progress Notes (Addendum)
PROGRESS NOTE    [name]   ZOX:[id number]  DOB: 1950/09/10  DOA: 09/06/2017 PCP: Redmond School, MD   Brief Narrative:  [name] a 67 y.o. male, with history of NIDDM, hypertension, non-Hodgkin lymphoma status post autologous bone marrow transplant in 2007, hypertension, hyperlipidemia who presents with a complaint of dyspnea, cough with yellow sputum, fevers and generalized weakness. Symptoms have been progressive over the past 1-2 wks.  In the ED he is noted to have a fever of 102.9 and has a CXR revealing bilateral infiltrates suspicious for pneumonia. He was also noted to have mild lactic acidosis.   Subjective: Cough is slightly better today. No dyspnea and no palpitations or chest pain    Assessment & Plan:   Principal Problem:   Bilateral pneumonia/   Acute respiratory failure with hypoxia - Severe Sepsis with fever, tachypnea, tachycardia and lactic acidosis - cont Rocephin and Zithromax- wean O2 as able -cont Neb treatments - f/u on strep U antigen  Active Problems:     Atrial flutter - new diagnosis - cont on Lopressor - CHA2DS2/VAS  3 but  ECHO is pending - add Eliquis if eCHO shows that this is non-valvular     DLBCL (diffuse large B cell lymphoma)  - in remission     Essential hypertension -cont Metoprolol    Diabetes   - on Amaryl at home- SSI in the hospital-  follow sugars   DVT prophylaxis: Lovenox Code Status: Full code Family Communication:  Disposition Plan: home when stable Consultants:   cardiology Procedures:   ECHO pending Antimicrobials:  Anti-infectives (From admission, onward)   Start     Dose/Rate Route Frequency Ordered Stop   09/07/17 2100  azithromycin (ZITHROMAX) 500 mg in dextrose 5 % 250 mL IVPB     500 mg 250 mL/hr over 60 Minutes Intravenous Every 24 hours 09/06/17 2342 09/14/17 2059   09/07/17 2000  cefTRIAXone (ROCEPHIN) 1 g in dextrose 5 % 50 mL IVPB     1 g 100 mL/hr over 30 Minutes Intravenous Every 24  hours 09/06/17 2342 09/14/17 1959   09/06/17 1945  cefTRIAXone (ROCEPHIN) 1 g in dextrose 5 % 50 mL IVPB     1 g 100 mL/hr over 30 Minutes Intravenous  Once 09/06/17 1933 09/06/17 2038   09/06/17 1945  azithromycin (ZITHROMAX) 500 mg in dextrose 5 % 250 mL IVPB     500 mg 250 mL/hr over 60 Minutes Intravenous  Once 09/06/17 1933 09/06/17 2201       Objective: Vitals:   09/06/17 2300 09/06/17 2357 09/07/17 0142 09/07/17 0441  BP: 125/80 (!) 146/64  (!) 142/79  Pulse: 88 88  91  Resp: (!) 34   20  Temp:  98.7 F (37.1 C)  99.5 F (37.5 C)  TempSrc:  Oral  Oral  SpO2: 94% 100% 91% 97%  Weight:  126 kg (277 lb 11.2 oz)    Height:  5\' 11"  (1.803 m)      Intake/Output Summary (Last 24 hours) at 09/07/2017 1130 Last data filed at 09/07/2017 0900 Gross per 24 hour  Intake 3603.75 ml  Output 600 ml  Net 3003.75 ml   Filed Weights   09/06/17 2005 09/06/17 2357  Weight: 126.1 kg (278 lb) 126 kg (277 lb 11.2 oz)    Examination: General exam: Appears comfortable  HEENT: PERRLA, oral mucosa moist, no sclera icterus or thrush Respiratory system: mild rhonchi. Respiratory effort normal. Cardiovascular system: S1 & S2 heard, IIRR.  No murmurs  Gastrointestinal system: Abdomen soft, non-tender, nondistended. Normal bowel sound. No organomegaly Central nervous system: Alert and oriented. No focal neurological deficits. Extremities: No cyanosis, clubbing or edema Skin: No rashes or ulcers Psychiatry:  Mood & affect appropriate.     Data Reviewed: I have personally reviewed following labs and imaging studies  CBC: Recent Labs  Lab 09/06/17 1910 09/07/17 0421  WBC 5.7 4.4  NEUTROABS 4.2  --   HGB 14.5 12.5*  HCT 43.5 38.3*  MCV 91.8 92.3  PLT 166 161   Basic Metabolic Panel: Recent Labs  Lab 09/06/17 1910 09/07/17 0421  NA 137 136  K 4.4 3.6  CL 103 106  CO2 23 20*  GLUCOSE 94 96  BUN 10 9  CREATININE 1.04 0.90  CALCIUM 9.2 8.3*   GFR: Estimated Creatinine  Clearance: 110.6 mL/min (by C-G formula based on SCr of 0.9 mg/dL). Liver Function Tests: Recent Labs  Lab 09/06/17 1910 09/07/17 0421  AST 30 17  ALT 21 15*  ALKPHOS 73 57  BILITOT 1.0 0.7  PROT 7.4 6.1*  ALBUMIN 3.8 3.2*   No results for input(s): LIPASE, AMYLASE in the last 168 hours. No results for input(s): AMMONIA in the last 168 hours. Coagulation Profile: No results for input(s): INR, PROTIME in the last 168 hours. Cardiac Enzymes: No results for input(s): CKTOTAL, CKMB, CKMBINDEX, TROPONINI in the last 168 hours. BNP (last 3 results) No results for input(s): PROBNP in the last 8760 hours. HbA1C: No results for input(s): HGBA1C in the last 72 hours. CBG: Recent Labs  Lab 09/07/17 0006 09/07/17 0741 09/07/17 1118  GLUCAP 75 91 88   Lipid Profile: No results for input(s): CHOL, HDL, LDLCALC, TRIG, CHOLHDL, LDLDIRECT in the last 72 hours. Thyroid Function Tests: No results for input(s): TSH, T4TOTAL, FREET4, T3FREE, THYROIDAB in the last 72 hours. Anemia Panel: No results for input(s): VITAMINB12, FOLATE, FERRITIN, TIBC, IRON, RETICCTPCT in the last 72 hours. Urine analysis:    Component Value Date/Time   COLORURINE YELLOW 09/06/2017 2119   APPEARANCEUR CLEAR 09/06/2017 2119   LABSPEC 1.021 09/06/2017 2119   PHURINE 5.0 09/06/2017 2119   GLUCOSEU NEGATIVE 09/06/2017 2119   HGBUR NEGATIVE 09/06/2017 2119   [place] NEGATIVE 09/06/2017 2119   [place] NEGATIVE 09/06/2017 2119   PROTEINUR 100 (A) 09/06/2017 2119   NITRITE NEGATIVE 09/06/2017 2119   LEUKOCYTESUR NEGATIVE 09/06/2017 2119   Sepsis Labs: @LABRCNTIP (procalcitonin:4,lacticidven:4) ) Recent Results (from the past 240 hour(s))  Culture, blood (Routine x 2)     Status: None (Preliminary result)   Collection Time: 09/06/17  7:10 PM  Result Value Ref Range Status   Specimen Description BLOOD LEFT FOREARM  Final   Special Requests   Final    BOTTLES DRAWN AEROBIC AND ANAEROBIC Blood Culture  adequate volume   Culture NO GROWTH < 12 HOURS  Final   Report Status PENDING  Incomplete  Culture, blood (Routine x 2)     Status: None (Preliminary result)   Collection Time: 09/06/17  7:16 PM  Result Value Ref Range Status   Specimen Description RIGHT ANTECUBITAL  Final   Special Requests   Final    BOTTLES DRAWN AEROBIC AND ANAEROBIC Blood Culture adequate volume   Culture NO GROWTH < 12 HOURS  Final   Report Status PENDING  Incomplete         Radiology Studies: Dg Chest 2 View  Result Date: 09/06/2017 CLINICAL DATA:  Cough fever and short of breath EXAM: CHEST  2  VIEW COMPARISON:  CT chest 05/18/2017, chest radiograph 05/18/2017 FINDINGS: Suspected small pleural effusion. Multifocal airspace opacities within the bilateral lung bases and left mid lung. Stable cardiomegaly with vascular congestion. No pneumothorax. Surgical changes of the thoracic spine. IMPRESSION: Suspected small pleural effusions with multifocal bilateral airspace opacities suspicious for pneumonia. Radiographic follow-up to resolution is recommended Mild cardiomegaly with vascular congestion Electronically Signed   By: Donavan Foil M.D.   On: 09/06/2017 19:13      Scheduled Meds: . enoxaparin (LOVENOX) injection  40 mg Subcutaneous Q24H  . insulin aspart  0-9 Units Subcutaneous TID WC  . metoprolol tartrate  12.5 mg Oral BID   Continuous Infusions: . sodium chloride 75 mL/hr at 09/07/17 0017  . azithromycin    . cefTRIAXone (ROCEPHIN)  IV    . sodium chloride       LOS: 0 days    Time spent in minutes: 35    Debbe Odea, MD [facility] Pager: [email or web address] Password [email or web address] 09/07/2017, 11:30 AM

## 2017-09-07 NOTE — Care Management Note (Signed)
Case Management Note  Patient Details  Name: [name] MRN: [id number] Date of Birth: 1951/02/07  Subjective/Objective:  Bilateral pneumonia/   Acute respiratory failure with hypoxia - Severe Sepsis with fever, tachypnea, tachycardia and lactic acidosis. From home with family. Ind with ADL's pta. Has RW and cane at home, does not use. He has PCP, still drives, reports no issues affording medications, gets them filled at [facility]. Acutely requiring oxygen. Desaturated today with ambulation.   Action/Plan: Anticipate DC home with self care. CM following for oxygen needs.    Expected Discharge Date:      unk             Expected Discharge Plan:  Home/Self Care  In-House Referral:     Discharge planning Services  CM Consult  Post Acute Care Choice:    Choice offered to:     DME Arranged:    DME Agency:     HH Arranged:    HH Agency:     Status of Service:  In process, will continue to follow  If discussed at Long Length of Stay Meetings, dates discussed:    Additional Comments:  Wilberth Damon, Chauncey Reading, RN 09/07/2017, 11:57 AM

## 2017-09-08 DIAGNOSIS — I5032 Chronic diastolic (congestive) heart failure: Secondary | ICD-10-CM

## 2017-09-08 LAB — CBC
HCT: 41.6 % (ref 39.0–52.0)
Hemoglobin: 13.5 g/dL (ref 13.0–17.0)
MCH: 30 pg (ref 26.0–34.0)
MCHC: 32.5 g/dL (ref 30.0–36.0)
MCV: 92.4 fL (ref 78.0–100.0)
PLATELETS: 169 10*3/uL (ref 150–400)
RBC: 4.5 MIL/uL (ref 4.22–5.81)
RDW: 15.2 % (ref 11.5–15.5)
WBC: 4.5 10*3/uL (ref 4.0–10.5)

## 2017-09-08 LAB — RESPIRATORY PANEL BY PCR
Adenovirus: NOT DETECTED
Bordetella pertussis: NOT DETECTED
CORONAVIRUS 229E-RVPPCR: NOT DETECTED
Chlamydophila pneumoniae: NOT DETECTED
Coronavirus HKU1: NOT DETECTED
Coronavirus NL63: NOT DETECTED
Coronavirus OC43: NOT DETECTED
INFLUENZA A-RVPPCR: NOT DETECTED
INFLUENZA B-RVPPCR: NOT DETECTED
METAPNEUMOVIRUS-RVPPCR: NOT DETECTED
MYCOPLASMA PNEUMONIAE-RVPPCR: NOT DETECTED
PARAINFLUENZA VIRUS 4-RVPPCR: NOT DETECTED
Parainfluenza Virus 1: NOT DETECTED
Parainfluenza Virus 2: NOT DETECTED
Parainfluenza Virus 3: NOT DETECTED
RESPIRATORY SYNCYTIAL VIRUS-RVPPCR: NOT DETECTED
Rhinovirus / Enterovirus: NOT DETECTED

## 2017-09-08 LAB — TSH: TSH: 1.122 u[IU]/mL (ref 0.350–4.500)

## 2017-09-08 LAB — EXPECTORATED SPUTUM ASSESSMENT W REFEX TO RESP CULTURE

## 2017-09-08 LAB — BASIC METABOLIC PANEL
Anion gap: 11 (ref 5–15)
BUN: 9 mg/dL (ref 6–20)
CALCIUM: 9.1 mg/dL (ref 8.9–10.3)
CHLORIDE: 105 mmol/L (ref 101–111)
CO2: 22 mmol/L (ref 22–32)
CREATININE: 0.91 mg/dL (ref 0.61–1.24)
GFR calc non Af Amer: >60 mL/min (ref >60)
Glucose, Bld: 87 mg/dL (ref 65–99)
Potassium: 4.1 mmol/L (ref 3.5–5.1)
Sodium: 138 mmol/L (ref 135–145)

## 2017-09-08 LAB — GLUCOSE, CAPILLARY
Glucose-Capillary: 82 mg/dL (ref 65–99)
Glucose-Capillary: 82 mg/dL (ref 65–99)
Glucose-Capillary: 77 mg/dL (ref 65–99)
Glucose-Capillary: 99 mg/dL (ref 65–99)

## 2017-09-08 LAB — HIV ANTIBODY (ROUTINE TESTING W REFLEX): HIV SCREEN 4TH GENERATION: NONREACTIVE

## 2017-09-08 LAB — EXPECTORATED SPUTUM ASSESSMENT W GRAM STAIN, RFLX TO RESP C

## 2017-09-08 LAB — MAGNESIUM: Magnesium: 2 mg/dL (ref 1.7–2.4)

## 2017-09-08 MED ORDER — METOPROLOL TARTRATE 25 MG PO TABS
25.0000 mg | ORAL_TABLET | Freq: Two times a day (BID) | ORAL | Status: DC
  Administered 2017-09-08 – 2017-09-10 (×5): 25 mg via ORAL
  Filled 2017-09-08 (×5): qty 1

## 2017-09-08 MED ORDER — IPRATROPIUM-ALBUTEROL 0.5-2.5 (3) MG/3ML IN SOLN
3.0000 mL | Freq: Once | RESPIRATORY_TRACT | Status: AC
  Administered 2017-09-08: 3 mL via RESPIRATORY_TRACT
  Filled 2017-09-08: qty 3

## 2017-09-08 MED ORDER — APIXABAN 5 MG PO TABS: 5.0000 mg | ORAL_TABLET | Freq: Two times a day (BID) | ORAL | 0 refills | Status: DC

## 2017-09-08 MED ORDER — DOXYCYCLINE HYCLATE 100 MG PO CAPS: 100.0000 mg | ORAL_CAPSULE | Freq: Two times a day (BID) | ORAL | 0 refills | Status: AC

## 2017-09-08 MED ORDER — GLIMEPIRIDE 1 MG PO TABS: ORAL_TABLET | ORAL | 0 refills | Status: DC

## 2017-09-08 MED ORDER — METOPROLOL TARTRATE 25 MG PO TABS: 25.0000 mg | ORAL_TABLET | Freq: Two times a day (BID) | ORAL | 0 refills | Status: DC

## 2017-09-08 NOTE — Care Management (Signed)
Patient qualifies for home oxygen, orders faxed to [facility] per patient request.

## 2017-09-08 NOTE — Progress Notes (Signed)
SATURATION QUALIFICATIONS: (This note is used to comply with regulatory documentation for home oxygen)  Patient Saturations on Room Air at Rest = 88%  Patient Saturations on Room Air while Ambulating = 88%  Patient Saturations on 4 Liters of oxygen while Ambulating = 92%  Please briefly explain why patient needs home oxygen:

## 2017-09-08 NOTE — Progress Notes (Signed)
09/08/2017 11:20 AM  Holding discharge today.  Re-evaluate tomorrow.   Murvin Natal MD

## 2017-09-08 NOTE — Discharge Summary (Addendum)
Physician Discharge Summary  [name] MWU:[id number] DOB: 1951-01-14 DOA: 09/06/2017  PCP: Redmond School, MD Cardiologist: Dr. Bronson Ing  Admit date: 09/06/2017 Discharge date: 09/10/2017  Admitted From: Home  Disposition: [place]    Recommendations for Outpatient Follow-up:  1. Follow up with PCP in 1 weeks 2. Please follow up with cardiologist in 2 weeks 3. Please monitor blood sugar every morning and PRN if not feeling well. 4. Continue Oxygen Nasal Cannula 2L/min - wean off as tolerated 5. Please obtain BMP/CBC in one week 6. Please follow up on the following pending results: final culture data  Discharge Condition: STABLE   CODE STATUS: FULL    Brief Hospitalization Summary: Please see all hospital notes, images, labs for full details of the hospitalization.  Brief Narrative:  [name] a66 y.o.male,with history of NIDDM,hypertension, non-Hodgkin lymphoma status post autologous bone marrow transplant in 2007, hypertension, hyperlipidemia who presents with a complaint of dyspnea, cough with yellow sputum, fevers and generalized weakness. Symptoms have been progressive over the past 1-2 wks.  In the ED he is noted to have a fever of 102.9 and has a CXR revealing bilateral infiltrates suspicious for pneumonia. He was also noted to have mild lactic acidosis.    Bilateral pneumonia/Acute respiratory failure with hypoxia - Severe Sepsis with fever, tachypnea, tachycardia and lactic acidosis - Sepsis Resolved now.  - discharge on doxycycline 100 mg BID x 7 days - cont Neb treatments - strep pneumo antigen negative - He qualifies for continuous oxygen.  He is being discharged with oxygen 2L/min, re-evaluate with PCP and wean off as tolerated.    Atrial flutter - new diagnosis - cont on Lopressor per cardiology team. Increased dose to 25 mg BID.   - CHA2DS2/VAS  3  - added Eliquis 5 mg BID per cardiology team.  Outpatient cardiology follow up  arranged.      DLBCL (diffuse large B cell lymphoma)  - in remission     Essential hypertension -cont Metoprolol 25 mg BID, titrate outpatient if needed    Diabetes   - on Amaryl at home- SSI in the hospital, resume amaryl at discharge. Follow BS every morning and PRN if not feeling well.    CBG (last 3)  Recent Labs    09/09/17 1547 09/09/17 2046 09/10/17 0804  GLUCAP 86 125* 116*   DVT prophylaxis: Lovenox Code Status: Full code Family Communication:  Disposition Plan: home when stable Consultants:   cardiology  Echocardiogram: Study Conclusions - Procedure narrative: Transthoracic echocardiography. Image  quality was adequate. Intravenous contrast (Definity) was administered. - Left ventricle: The cavity size was normal. Wall thickness was  increased in a pattern of mild LVH. Systolic function was normal.   The estimated ejection fraction was in the range of 60% to 65%.   Wall motion was normal; there were no regional wall motion  abnormalities. The study is not technically sufficient to allow   evaluation of LV diastolic function due to atrial flutter. - Left atrium: The atrium was mildly dilated. - Systemic veins: IVC is dilated with normal respiratory variation.  Estimated CVP 8 mmHg  Discharge Diagnoses:  Principal Problem:   Bilateral pneumonia Active Problems:   DLBCL (diffuse large B cell lymphoma) (HCC)   DIASTOLIC HEART FAILURE, CHRONIC   Essential hypertension   Diabetes ([place])   Acute respiratory failure with hypoxia (HCC)   Atrial flutter [facility])  Discharge Instructions: Discharge Instructions    Call MD for:  difficulty breathing, headache  or visual disturbances   Complete by:  As directed    Call MD for:  difficulty breathing, headache or visual disturbances   Complete by:  As directed    Call MD for:  extreme fatigue   Complete by:  As directed    Call MD for:  extreme fatigue   Complete by:  As directed    Call MD for:  hives   Complete by:   As directed    Call MD for:  hives   Complete by:  As directed    Call MD for:  persistant dizziness or light-headedness   Complete by:  As directed    Call MD for:  persistant dizziness or light-headedness   Complete by:  As directed    Call MD for:  persistant nausea and vomiting   Complete by:  As directed    Call MD for:  severe uncontrolled pain   Complete by:  As directed    Increase activity slowly   Complete by:  As directed    Increase activity slowly   Complete by:  As directed    Increase activity slowly   Complete by:  As directed      Allergies as of 09/10/2017      Reactions   Penicillins Rash   Has patient had a PCN reaction causing immediate rash, facial/tongue/throat swelling, SOB or lightheadedness with hypotension: Unknown Has patient had a PCN reaction causing severe rash involving mucus membranes or skin necrosis: Unknown Has patient had a PCN reaction that required hospitalization: Unknown Has patient had a PCN reaction occurring within the last 10 years: No If all of the above answers are "NO", then may proceed with Cephalosporin use.      Medication List    TAKE these medications   apixaban 5 MG Tabs tablet Commonly known as:  ELIQUIS Take 1 tablet (5 mg total) by mouth 2 (two) times daily.   doxycycline 100 MG capsule Commonly known as:  VIBRAMYCIN Take 1 capsule (100 mg total) by mouth 2 (two) times daily for 7 days.   glimepiride 1 MG tablet Commonly known as:  AMARYL take 1/2 tablet by mouth once daily WITH BREAKFAST   ibuprofen 200 MG tablet Commonly known as:  ADVIL,MOTRIN Take 200 mg by mouth daily as needed for headache.   ipratropium-albuterol 0.5-2.5 (3) MG/3ML Soln Commonly known as:  DUONEB Take 3 mLs by nebulization every 8 (eight) hours.   metoprolol tartrate 25 MG tablet Commonly known as:  LOPRESSOR Take 1 tablet (25 mg total) by mouth 2 (two) times daily. What changed:  how much to take   predniSONE 20 MG  tablet Commonly known as:  DELTASONE Take 2 tablets (40 mg total) by mouth daily with breakfast for 5 days. Start taking on:  09/11/2017            Durable Medical Equipment  (From admission, onward)        Start     Ordered   09/08/17 1041  For home use only DME oxygen  Once    Question Answer Comment  Mode or (Route) Nasal cannula   Liters per Minute 2   Frequency Continuous (stationary and portable oxygen unit needed)   Oxygen conserving device Yes   Oxygen delivery system Gas      09/08/17 1040      Contact information for follow-up providers    Herminio Commons, MD Follow up on 09/23/2017.   Specialty:  Cardiology Why:  at 1:30 pm Contact information: [place] [address] [phone]        Redmond School, MD Follow up on 09/13/2017.   Specialty:  Internal Medicine Why:  at 11:15 am Contact information: [address] [place] [address] [phone]            Contact information for after-discharge care    [facility] Follow up.   Service:  Skilled Nursing Contact information: [address] [place] [address] [phone]                 Allergies  Allergen Reactions  . Penicillins Rash    Has patient had a PCN reaction causing immediate rash, facial/tongue/throat swelling, SOB or lightheadedness with hypotension: Unknown Has patient had a PCN reaction causing severe rash involving mucus membranes or skin necrosis: Unknown Has patient had a PCN reaction that required hospitalization: Unknown Has patient had a PCN reaction occurring within the last 10 years: No If all of the above answers are "NO", then may proceed with Cephalosporin use.    Allergies as of 09/10/2017      Reactions   Penicillins Rash   Has patient had a PCN reaction causing immediate rash, facial/tongue/throat swelling, SOB or lightheadedness with hypotension: Unknown Has patient had a PCN  reaction causing severe rash involving mucus membranes or skin necrosis: Unknown Has patient had a PCN reaction that required hospitalization: Unknown Has patient had a PCN reaction occurring within the last 10 years: No If all of the above answers are "NO", then may proceed with Cephalosporin use.      Medication List    TAKE these medications   apixaban 5 MG Tabs tablet Commonly known as:  ELIQUIS Take 1 tablet (5 mg total) by mouth 2 (two) times daily.   doxycycline 100 MG capsule Commonly known as:  VIBRAMYCIN Take 1 capsule (100 mg total) by mouth 2 (two) times daily for 7 days.   glimepiride 1 MG tablet Commonly known as:  AMARYL take 1/2 tablet by mouth once daily WITH BREAKFAST   ibuprofen 200 MG tablet Commonly known as:  ADVIL,MOTRIN Take 200 mg by mouth daily as needed for headache.   ipratropium-albuterol 0.5-2.5 (3) MG/3ML Soln Commonly known as:  DUONEB Take 3 mLs by nebulization every 8 (eight) hours.   metoprolol tartrate 25 MG tablet Commonly known as:  LOPRESSOR Take 1 tablet (25 mg total) by mouth 2 (two) times daily. What changed:  how much to take   predniSONE 20 MG tablet Commonly known as:  DELTASONE Take 2 tablets (40 mg total) by mouth daily with breakfast for 5 days. Start taking on:  09/11/2017            Durable Medical Equipment  (From admission, onward)        Start     Ordered   09/08/17 1041  For home use only DME oxygen  Once    Question Answer Comment  Mode or (Route) Nasal cannula   Liters per Minute 2   Frequency Continuous (stationary and portable oxygen unit needed)   Oxygen conserving device Yes   Oxygen delivery system Gas      09/08/17 1040     Procedures/Studies: Dg Chest 2 View  Result Date: 09/06/2017 CLINICAL DATA:  Cough fever and short of breath EXAM: CHEST  2 VIEW COMPARISON:  CT chest 05/18/2017, chest radiograph 05/18/2017 FINDINGS: Suspected small pleural effusion. Multifocal airspace opacities  within  the bilateral lung bases and left mid lung. Stable cardiomegaly with vascular congestion. No pneumothorax. Surgical changes of the thoracic spine. IMPRESSION: Suspected small pleural effusions with multifocal bilateral airspace opacities suspicious for pneumonia. Radiographic follow-up to resolution is recommended Mild cardiomegaly with vascular congestion Electronically Signed   By: Donavan Foil M.D.   On: 09/06/2017 19:13   Dg Chest Port 1 View  Result Date: 09/09/2017 CLINICAL DATA:  Increase shortness of breath, asthma, lymphoma, former smoking history EXAM: PORTABLE CHEST 1 VIEW COMPARISON:  Chest x-ray of 09/06/2017 FINDINGS: The lungs of the again are poorly aerated and there is right basilar parenchymal opacity present. There is some pulmonary vascular congestion, and a passage at the right base could also be due to effusion although pneumonia cannot be excluded. Cardiomegaly is stable. Corpectomy is noted in the mid upper thoracic spine. IMPRESSION: 1. Question CHF with cardiomegaly, pulmonary vascular congestion and possible small right effusion. 2. Cannot exclude pneumonia at the right lung base. Electronically Signed   By: Ivar Drape M.D.   On: 09/09/2017 12:04     Subjective: Pt without complaints.  He says he feels much better today.  No CP.  No SOB.  Ready to discharge to SNF.   Discharge Exam: Vitals:   09/10/17 0759 09/10/17 0840  BP:  (!) 150/78  Pulse:  89  Resp:    Temp:    SpO2: 94%    Vitals:   09/09/17 2126 09/10/17 0407 09/10/17 0759 09/10/17 0840  BP: (!) 150/79 (!) 149/88  (!) 150/78  Pulse: (!) 106 70  89  Resp: 20 16    Temp: 98.4 F (36.9 C) 97.8 F (36.6 C)    TempSrc: Oral Oral    SpO2: 97% 95% 94%   Weight:      Height:       General: Pt is alert, awake, not in acute distress Cardiovascular: irregularly irregular, S1/S2 +, no rubs, no gallops Respiratory: CTA bilaterally, no wheezing, no rhonchi Abdominal: Soft, NT, ND, bowel sounds + Extremities:  no edema, no cyanosis   The results of significant diagnostics from this hospitalization (including imaging, microbiology, ancillary and laboratory) are listed below for reference.     Microbiology: Recent Results (from the past 240 hour(s))  Culture, blood (Routine x 2)     Status: None (Preliminary result)   Collection Time: 09/06/17  7:10 PM  Result Value Ref Range Status   Specimen Description BLOOD LEFT FOREARM  Final   Special Requests   Final    BOTTLES DRAWN AEROBIC AND ANAEROBIC Blood Culture adequate volume   Culture NO GROWTH 4 DAYS  Final   Report Status PENDING  Incomplete  Culture, blood (Routine x 2)     Status: None (Preliminary result)   Collection Time: 09/06/17  7:16 PM  Result Value Ref Range Status   Specimen Description RIGHT ANTECUBITAL  Final   Special Requests   Final    BOTTLES DRAWN AEROBIC AND ANAEROBIC Blood Culture adequate volume   Culture NO GROWTH 4 DAYS  Final   Report Status PENDING  Incomplete  Respiratory Panel by PCR     Status: None   Collection Time: 09/07/17  3:00 PM  Result Value Ref Range Status   Adenovirus NOT DETECTED NOT DETECTED Final   Coronavirus 229E NOT DETECTED NOT DETECTED Final   Coronavirus HKU1 NOT DETECTED NOT DETECTED Final   Coronavirus NL63 NOT DETECTED NOT DETECTED Final   Coronavirus OC43 NOT DETECTED NOT DETECTED  Final   Metapneumovirus NOT DETECTED NOT DETECTED Final   Rhinovirus / Enterovirus NOT DETECTED NOT DETECTED Final   Influenza A NOT DETECTED NOT DETECTED Final   Influenza B NOT DETECTED NOT DETECTED Final   Parainfluenza Virus 1 NOT DETECTED NOT DETECTED Final   Parainfluenza Virus 2 NOT DETECTED NOT DETECTED Final   Parainfluenza Virus 3 NOT DETECTED NOT DETECTED Final   Parainfluenza Virus 4 NOT DETECTED NOT DETECTED Final   Respiratory Syncytial Virus NOT DETECTED NOT DETECTED Final   Bordetella pertussis NOT DETECTED NOT DETECTED Final   Chlamydophila pneumoniae NOT DETECTED NOT DETECTED Final    Mycoplasma pneumoniae NOT DETECTED NOT DETECTED Final    Comment: Performed at [facility], [place] [address], [place], [place] [address]  Culture, sputum-assessment     Status: None   Collection Time: 09/08/17  6:00 AM  Result Value Ref Range Status   Specimen Description SPUTUM  Final   Special Requests NONE  Final   Sputum evaluation THIS SPECIMEN IS ACCEPTABLE FOR SPUTUM CULTURE  Final   Report Status 09/08/2017 FINAL  Final  Culture, respiratory (NON-Expectorated)     Status: None (Preliminary result)   Collection Time: 09/08/17  6:00 AM  Result Value Ref Range Status   Specimen Description SPUTUM  Final   Special Requests NONE Reflexed from [id number]  Final   Gram Stain   Final    FEW WBC PRESENT, PREDOMINANTLY PMN FEW GRAM POSITIVE COCCI FEW GRAM POSITIVE RODS    Culture   Final    CULTURE REINCUBATED FOR BETTER GROWTH Performed at [facility], [place] [address], [place], [place] [address]    Report Status PENDING  Incomplete     Labs: BNP (last 3 results) Recent Labs    05/18/17 1339  BNP 99.3   Basic Metabolic Panel: Recent Labs  Lab 09/06/17 1910 09/07/17 0421 09/08/17 0515  NA 137 136 138  K 4.4 3.6 4.1  CL 103 106 105  CO2 23 20* 22  GLUCOSE 94 96 87  BUN 10 9 9   CREATININE 1.04 0.90 0.91  CALCIUM 9.2 8.3* 9.1  MG  --   --  2.0   Liver Function Tests: Recent Labs  Lab 09/06/17 1910 09/07/17 0421  AST 30 17  ALT 21 15*  ALKPHOS 73 57  BILITOT 1.0 0.7  PROT 7.4 6.1*  ALBUMIN 3.8 3.2*   No results for input(s): LIPASE, AMYLASE in the last 168 hours. No results for input(s): AMMONIA in the last 168 hours. CBC: Recent Labs  Lab 09/06/17 1910 09/07/17 0421 09/08/17 0515  WBC 5.7 4.4 4.5  NEUTROABS 4.2  --   --   HGB 14.5 12.5* 13.5  HCT 43.5 38.3* 41.6  MCV 91.8 92.3 92.4  PLT 166 160 169   Cardiac Enzymes: No results for input(s): CKTOTAL, CKMB, CKMBINDEX, TROPONINI in the last 168 hours. BNP: Invalid input(s):  POCBNP CBG: Recent Labs  Lab 09/09/17 0748 09/09/17 1133 09/09/17 1547 09/09/17 2046 09/10/17 0804  GLUCAP 94 96 86 125* 116*   D-Dimer No results for input(s): DDIMER in the last 72 hours. Hgb A1c No results for input(s): HGBA1C in the last 72 hours. Lipid Profile No results for input(s): CHOL, HDL, LDLCALC, TRIG, CHOLHDL, LDLDIRECT in the last 72 hours. Thyroid function studies Recent Labs    09/08/17 0515  TSH 1.122   Anemia work up No results for input(s): VITAMINB12, FOLATE, FERRITIN, TIBC, IRON, RETICCTPCT in the last 72 hours. Urinalysis  Component Value Date/Time   COLORURINE YELLOW 09/06/2017 2119   APPEARANCEUR CLEAR 09/06/2017 2119   LABSPEC 1.021 09/06/2017 2119   PHURINE 5.0 09/06/2017 2119   GLUCOSEU NEGATIVE 09/06/2017 2119   HGBUR NEGATIVE 09/06/2017 2119   [place] NEGATIVE 09/06/2017 2119   [place] NEGATIVE 09/06/2017 2119   PROTEINUR 100 (A) 09/06/2017 2119   NITRITE NEGATIVE 09/06/2017 2119   LEUKOCYTESUR NEGATIVE 09/06/2017 2119   Sepsis Labs Invalid input(s): PROCALCITONIN,  WBC,  LACTICIDVEN Microbiology Recent Results (from the past 240 hour(s))  Culture, blood (Routine x 2)     Status: None (Preliminary result)   Collection Time: 09/06/17  7:10 PM  Result Value Ref Range Status   Specimen Description BLOOD LEFT FOREARM  Final   Special Requests   Final    BOTTLES DRAWN AEROBIC AND ANAEROBIC Blood Culture adequate volume   Culture NO GROWTH 4 DAYS  Final   Report Status PENDING  Incomplete  Culture, blood (Routine x 2)     Status: None (Preliminary result)   Collection Time: 09/06/17  7:16 PM  Result Value Ref Range Status   Specimen Description RIGHT ANTECUBITAL  Final   Special Requests   Final    BOTTLES DRAWN AEROBIC AND ANAEROBIC Blood Culture adequate volume   Culture NO GROWTH 4 DAYS  Final   Report Status PENDING  Incomplete  Respiratory Panel by PCR     Status: None   Collection Time: 09/07/17  3:00 PM  Result Value  Ref Range Status   Adenovirus NOT DETECTED NOT DETECTED Final   Coronavirus 229E NOT DETECTED NOT DETECTED Final   Coronavirus HKU1 NOT DETECTED NOT DETECTED Final   Coronavirus NL63 NOT DETECTED NOT DETECTED Final   Coronavirus OC43 NOT DETECTED NOT DETECTED Final   Metapneumovirus NOT DETECTED NOT DETECTED Final   Rhinovirus / Enterovirus NOT DETECTED NOT DETECTED Final   Influenza A NOT DETECTED NOT DETECTED Final   Influenza B NOT DETECTED NOT DETECTED Final   Parainfluenza Virus 1 NOT DETECTED NOT DETECTED Final   Parainfluenza Virus 2 NOT DETECTED NOT DETECTED Final   Parainfluenza Virus 3 NOT DETECTED NOT DETECTED Final   Parainfluenza Virus 4 NOT DETECTED NOT DETECTED Final   Respiratory Syncytial Virus NOT DETECTED NOT DETECTED Final   Bordetella pertussis NOT DETECTED NOT DETECTED Final   Chlamydophila pneumoniae NOT DETECTED NOT DETECTED Final   Mycoplasma pneumoniae NOT DETECTED NOT DETECTED Final    Comment: Performed at [facility], [place] [address] [place], [place] [address]  Culture, sputum-assessment     Status: None   Collection Time: 09/08/17  6:00 AM  Result Value Ref Range Status   Specimen Description SPUTUM  Final   Special Requests NONE  Final   Sputum evaluation THIS SPECIMEN IS ACCEPTABLE FOR SPUTUM CULTURE  Final   Report Status 09/08/2017 FINAL  Final  Culture, respiratory (NON-Expectorated)     Status: None (Preliminary result)   Collection Time: 09/08/17  6:00 AM  Result Value Ref Range Status   Specimen Description SPUTUM  Final   Special Requests NONE Reflexed from [id number]  Final   Gram Stain   Final    FEW WBC PRESENT, PREDOMINANTLY PMN FEW GRAM POSITIVE COCCI FEW GRAM POSITIVE RODS    Culture   Final    CULTURE REINCUBATED FOR BETTER GROWTH Performed at [facility], [place] [address], [place], [place] [address]    Report Status PENDING  Incomplete    Time coordinating discharge:  42  minutes  SIGNED:  Irwin Brakeman,  MD  [facility] 09/10/2017, 10:04 AM Pager [phone]  If 7PM-7AM, please contact night-coverage [email or web address] Password [email or web address]

## 2017-09-08 NOTE — Progress Notes (Signed)
Pt weaned to room air, O2 saturation 88% on room air sitting/walking to bathroom. Pt became very short of breath, was assisted back to chair and instructed to take slow deep breaths. O2 via [place] replaced and patient's O2 saturation increased over time. Pt was then assisted ambulating in hallway, O2 had to be increased to 4 L to keep O2 saturations above 92%. HR was also elevated 130's to 140's while ambulating. Dr. Wynetta Emery paged and made aware.

## 2017-09-09 ENCOUNTER — Encounter (HOSPITAL_COMMUNITY): Payer: Self-pay | Admitting: Family Medicine

## 2017-09-09 ENCOUNTER — Inpatient Hospital Stay (HOSPITAL_COMMUNITY): Payer: Medicare Other

## 2017-09-09 DIAGNOSIS — E1122 Type 2 diabetes mellitus with diabetic chronic kidney disease: Secondary | ICD-10-CM

## 2017-09-09 DIAGNOSIS — N183 Chronic kidney disease, stage 3 (moderate): Secondary | ICD-10-CM

## 2017-09-09 LAB — GLUCOSE, CAPILLARY
GLUCOSE-CAPILLARY: 86 mg/dL (ref 65–99)
GLUCOSE-CAPILLARY: 125 mg/dL — AB (ref 65–99)
Glucose-Capillary: 94 mg/dL (ref 65–99)
Glucose-Capillary: 96 mg/dL (ref 65–99)

## 2017-09-09 MED ORDER — IPRATROPIUM-ALBUTEROL 0.5-2.5 (3) MG/3ML IN SOLN
3.0000 mL | RESPIRATORY_TRACT | Status: DC
  Administered 2017-09-09 – 2017-09-10 (×5): 3 mL via RESPIRATORY_TRACT
  Filled 2017-09-09 (×6): qty 3

## 2017-09-09 MED ORDER — PREDNISONE 20 MG PO TABS
40.0000 mg | ORAL_TABLET | Freq: Two times a day (BID) | ORAL | Status: DC
  Administered 2017-09-09 – 2017-09-10 (×3): 40 mg via ORAL
  Filled 2017-09-09 (×3): qty 2

## 2017-09-09 MED ORDER — DOXYCYCLINE HYCLATE 100 MG PO TABS
100.0000 mg | ORAL_TABLET | Freq: Two times a day (BID) | ORAL | Status: DC
  Administered 2017-09-09 – 2017-09-10 (×3): 100 mg via ORAL
  Filled 2017-09-09 (×3): qty 1

## 2017-09-09 MED ORDER — METHYLPREDNISOLONE SODIUM SUCC 125 MG IJ SOLR: 60.0000 mg | Freq: Two times a day (BID) | INTRAMUSCULAR | Status: DC

## 2017-09-09 NOTE — Progress Notes (Addendum)
PROGRESS NOTE    [name]   HQI:[id number]  DOB: [birth year]  DOA: 09/06/2017 PCP: Redmond School, MD   Brief Narrative:  [name] a 67 y.o. male, with history of NIDDM, hypertension, non-Hodgkin lymphoma status post autologous bone marrow transplant in 2007, hypertension, hyperlipidemia who presents with a complaint of dyspnea, cough with yellow sputum, fevers and generalized weakness. Symptoms have been progressive over the past 1-2 wks.  In the ED he is noted to have a fever of 102.9 and has a CXR revealing bilateral infiltrates suspicious for pneumonia. He was also noted to have mild lactic acidosis.  Subjective: Pt having SOB with slight activity.  He is very deconditioned.     Assessment & Plan:     Bilateral pneumonia/   Acute respiratory failure with hypoxia - Severe Sepsis with fever, tachypnea, tachycardia and lactic acidosis - cont Rocephin and Zithromax- continue oxygen support -cont Neb treatments - strep antigen negative    Atrial flutter - new diagnosis - cont on Lopressor - CHA2DS2/VAS  3 but  ECHO is pending - added Eliquis per cardiology team     DLBCL (diffuse large B cell lymphoma)  - in remission     Essential hypertension -cont Metoprolol 25 mg BID    Diabetes   - on Amaryl at home- SSI in the hospital-  follow sugars  CBG (last 3)  Recent Labs    09/08/17 2041 09/09/17 0748 09/09/17 1133  GLUCAP 99 94 96   DVT prophylaxis: Lovenox Code Status: Full code Family Communication:  Disposition Plan: PT eval pending, I think he will need SNF placement Consultants:   cardiology Procedures:   ECHO Study Conclusions - Procedure narrative: Transthoracic echocardiography. Image  quality was adequate. Intravenous contrast (Definity) was  administered. - Left ventricle: The cavity size was normal. Wall thickness was increased in a pattern of mild LVH. Systolic function was normal.   The estimated ejection fraction was in the range of 60% to  65%.   Wall motion was normal; there were no regional wall motion abnormalities. The study is not technically sufficient to allow evaluation of LV diastolic function due to atrial flutter. - Left atrium: The atrium was mildly dilated. - Systemic veins: IVC is dilated with normal respiratory variation.  Estimated CVP 8 mmHg. Antimicrobials:  Anti-infectives (From admission, onward)   Start     Dose/Rate Route Frequency Ordered Stop   09/08/17 0000  doxycycline (VIBRAMYCIN) 100 MG capsule     100 mg Oral 2 times daily 09/08/17 0940 09/15/17 2359   09/07/17 2100  azithromycin (ZITHROMAX) 500 mg in dextrose 5 % 250 mL IVPB     500 mg 250 mL/hr over 60 Minutes Intravenous Every 24 hours 09/06/17 2342 09/14/17 2059   09/07/17 2000  cefTRIAXone (ROCEPHIN) 1 g in dextrose 5 % 50 mL IVPB     1 g 100 mL/hr over 30 Minutes Intravenous Every 24 hours 09/06/17 2342 09/14/17 1959   09/06/17 1945  cefTRIAXone (ROCEPHIN) 1 g in dextrose 5 % 50 mL IVPB     1 g 100 mL/hr over 30 Minutes Intravenous  Once 09/06/17 1933 09/06/17 2038   09/06/17 1945  azithromycin (ZITHROMAX) 500 mg in dextrose 5 % 250 mL IVPB     500 mg 250 mL/hr over 60 Minutes Intravenous  Once 09/06/17 1933 09/06/17 2201     Objective: Vitals:   09/08/17 2111 09/08/17 2123 09/09/17 0121 09/09/17 0541  BP: (!) 179/89   (!) 147/81  Pulse: 100   98  Resp: 20   18  Temp: 99 F (37.2 C)   98.2 F (36.8 C)  TempSrc: Oral   Oral  SpO2: 93% 95% 91% 94%  Weight:      Height:        Intake/Output Summary (Last 24 hours) at 09/09/2017 1150 Last data filed at 09/08/2017 1900 Gross per 24 hour  Intake 240 ml  Output -  Net 240 ml   Filed Weights   09/06/17 2005 09/06/17 2357  Weight: 126.1 kg (278 lb) 126 kg (277 lb 11.2 oz)   Examination: General exam: Appears comfortable  HEENT: PERRLA, oral mucosa moist, no sclera icterus or thrush Respiratory system: mild tachypnea. Poor air movement.  Cardiovascular system: S1 & S2 heard,  IIRR.  No murmurs  Gastrointestinal system: Abdomen soft, non-tender, nondistended. Normal bowel sound. No organomegaly Central nervous system: Alert and oriented. No focal neurological deficits. Extremities: No cyanosis, clubbing or edema Skin: No rashes or ulcers Psychiatry:  Mood & affect appropriate.   Data Reviewed: I have personally reviewed following labs and imaging studies  CBC: Recent Labs  Lab 09/06/17 1910 09/07/17 0421 09/08/17 0515  WBC 5.7 4.4 4.5  NEUTROABS 4.2  --   --   HGB 14.5 12.5* 13.5  HCT 43.5 38.3* 41.6  MCV 91.8 92.3 92.4  PLT 166 160 202   Basic Metabolic Panel: Recent Labs  Lab 09/06/17 1910 09/07/17 0421 09/08/17 0515  NA 137 136 138  K 4.4 3.6 4.1  CL 103 106 105  CO2 23 20* 22  GLUCOSE 94 96 87  BUN 10 9 9   CREATININE 1.04 0.90 0.91  CALCIUM 9.2 8.3* 9.1  MG  --   --  2.0   GFR: Estimated Creatinine Clearance: 109.4 mL/min (by C-G formula based on SCr of 0.91 mg/dL). Liver Function Tests: Recent Labs  Lab 09/06/17 1910 09/07/17 0421  AST 30 17  ALT 21 15*  ALKPHOS 73 57  BILITOT 1.0 0.7  PROT 7.4 6.1*  ALBUMIN 3.8 3.2*   No results for input(s): LIPASE, AMYLASE in the last 168 hours. No results for input(s): AMMONIA in the last 168 hours. Coagulation Profile: No results for input(s): INR, PROTIME in the last 168 hours. Cardiac Enzymes: No results for input(s): CKTOTAL, CKMB, CKMBINDEX, TROPONINI in the last 168 hours. BNP (last 3 results) No results for input(s): PROBNP in the last 8760 hours. HbA1C: Recent Labs    09/06/17 2047  HGBA1C 5.3   CBG: Recent Labs  Lab 09/08/17 1133 09/08/17 1629 09/08/17 2041 09/09/17 0748 09/09/17 1133  GLUCAP 82 77 99 94 96   Lipid Profile: No results for input(s): CHOL, HDL, LDLCALC, TRIG, CHOLHDL, LDLDIRECT in the last 72 hours. Thyroid Function Tests: Recent Labs    09/08/17 0515  TSH 1.122   Anemia Panel: No results for input(s): VITAMINB12, FOLATE, FERRITIN, TIBC,  IRON, RETICCTPCT in the last 72 hours. Urine analysis:    Component Value Date/Time   COLORURINE YELLOW 09/06/2017 2119   APPEARANCEUR CLEAR 09/06/2017 2119   LABSPEC 1.021 09/06/2017 2119   PHURINE 5.0 09/06/2017 2119   GLUCOSEU NEGATIVE 09/06/2017 2119   HGBUR NEGATIVE 09/06/2017 2119   [place] NEGATIVE 09/06/2017 2119   [place] NEGATIVE 09/06/2017 2119   PROTEINUR 100 (A) 09/06/2017 2119   NITRITE NEGATIVE 09/06/2017 2119   LEUKOCYTESUR NEGATIVE 09/06/2017 2119   Recent Results (from the past 240 hour(s))  Culture, blood (Routine x 2)     Status: None (  Preliminary result)   Collection Time: 09/06/17  7:10 PM  Result Value Ref Range Status   Specimen Description BLOOD LEFT FOREARM  Final   Special Requests   Final    BOTTLES DRAWN AEROBIC AND ANAEROBIC Blood Culture adequate volume   Culture NO GROWTH 3 DAYS  Final   Report Status PENDING  Incomplete  Culture, blood (Routine x 2)     Status: None (Preliminary result)   Collection Time: 09/06/17  7:16 PM  Result Value Ref Range Status   Specimen Description RIGHT ANTECUBITAL  Final   Special Requests   Final    BOTTLES DRAWN AEROBIC AND ANAEROBIC Blood Culture adequate volume   Culture NO GROWTH 3 DAYS  Final   Report Status PENDING  Incomplete  Respiratory Panel by PCR     Status: None   Collection Time: 09/07/17  3:00 PM  Result Value Ref Range Status   Adenovirus NOT DETECTED NOT DETECTED Final   Coronavirus 229E NOT DETECTED NOT DETECTED Final   Coronavirus HKU1 NOT DETECTED NOT DETECTED Final   Coronavirus NL63 NOT DETECTED NOT DETECTED Final   Coronavirus OC43 NOT DETECTED NOT DETECTED Final   Metapneumovirus NOT DETECTED NOT DETECTED Final   Rhinovirus / Enterovirus NOT DETECTED NOT DETECTED Final   Influenza A NOT DETECTED NOT DETECTED Final   Influenza B NOT DETECTED NOT DETECTED Final   Parainfluenza Virus 1 NOT DETECTED NOT DETECTED Final   Parainfluenza Virus 2 NOT DETECTED NOT DETECTED Final    Parainfluenza Virus 3 NOT DETECTED NOT DETECTED Final   Parainfluenza Virus 4 NOT DETECTED NOT DETECTED Final   Respiratory Syncytial Virus NOT DETECTED NOT DETECTED Final   Bordetella pertussis NOT DETECTED NOT DETECTED Final   Chlamydophila pneumoniae NOT DETECTED NOT DETECTED Final   Mycoplasma pneumoniae NOT DETECTED NOT DETECTED Final    Comment: Performed at [facility], [place] [address], [place], [place] [address]  Culture, sputum-assessment     Status: None   Collection Time: 09/08/17  6:00 AM  Result Value Ref Range Status   Specimen Description SPUTUM  Final   Special Requests NONE  Final   Sputum evaluation THIS SPECIMEN IS ACCEPTABLE FOR SPUTUM CULTURE  Final   Report Status 09/08/2017 FINAL  Final  Culture, respiratory (NON-Expectorated)     Status: None (Preliminary result)   Collection Time: 09/08/17  6:00 AM  Result Value Ref Range Status   Specimen Description SPUTUM  Final   Special Requests NONE Reflexed from [id number]  Final   Gram Stain   Final    FEW WBC PRESENT, PREDOMINANTLY PMN FEW GRAM POSITIVE COCCI FEW GRAM POSITIVE RODS    Culture   Final    CULTURE REINCUBATED FOR BETTER GROWTH Performed at [facility], [place] [address], [place], [place] [address]    Report Status PENDING  Incomplete    Radiology Studies: No results found.  Scheduled Meds: . apixaban  5 mg Oral BID  . insulin aspart  0-9 Units Subcutaneous TID WC  . ipratropium-albuterol  3 mL Nebulization Q4H  . metoprolol tartrate  25 mg Oral BID   Continuous Infusions: . azithromycin Stopped (09/08/17 2155)  . cefTRIAXone (ROCEPHIN)  IV Stopped (09/08/17 2030)  . sodium chloride      LOS: 2 days   Time spent in minutes: 35  Irwin Brakeman, MD [facility] Pager: [email or web address] Password [email or web address] 09/09/2017, 11:50 AM

## 2017-09-09 NOTE — Evaluation (Signed)
Physical Therapy Evaluation Patient Details Name: [name] MRN: [id number] DOB: [birth year] Today's Date: 09/09/2017   History of Present Illness  67 yo male with onset of PNA was admitted, has CHF suspected and cardiomegaly.  No previous use of O2 at home.  PMHx:  Lymphoma, asthma, bone marrow transplant, HTN, DM,   Clinical Impression  Pt is up to walk with PT after having been admitted for SOB and PNA and now demonstrating ability to maintain O2 sats on O2 but did not attempt gait without it.  Plan is for pt to go to SNF for strengthening and endurance training, and will return home when comfortably able to walk and care for himself.  Follow acutely for strengthening and gait training as tolerated and may try a gait trip with no O2 for confirmation of the need to maintain sats.  96% pregait and 94% after gait on O2 2L.    Follow Up Recommendations SNF    Equipment Recommendations  None recommended by PT    Recommendations for Other Services       Precautions / Restrictions Precautions Precautions: Fall(telemetry) Precaution Comments: O2 via nasal cannula Restrictions Weight Bearing Restrictions: No      Mobility  Bed Mobility               General bed mobility comments: up in chair when PT arrived  Transfers Overall transfer level: Needs assistance Equipment used: Rolling walker (2 wheeled);1 person hand held assist Transfers: Sit to/from Stand Sit to Stand: Mod assist         General transfer comment: mod to power up from both recliner and later in BR  Ambulation/Gait Ambulation/Gait assistance: Min guard;Min assist Ambulation Distance (Feet): 70 Feet Assistive device: Rolling walker (2 wheeled);1 person hand held assist Gait Pattern/deviations: Step-through pattern;Decreased stride length;Wide base of support;Trunk flexed;Shuffle(slow pace with controlled breathing pursed lips w/SOB) Gait velocity: reduced Gait velocity interpretation: Below normal speed  for age/gender General Gait Details: pt is motivated to increase distance but very slow pace due to SOB and can turn walker with no help but can take a few steps with no AD  Stairs            Wheelchair Mobility    Modified Rankin (Stroke Patients Only)       Balance Overall balance assessment: Needs assistance Sitting-balance support: Feet supported Sitting balance-Leahy Scale: Good     Standing balance support: Bilateral upper extremity supported;During functional activity Standing balance-Leahy Scale: Fair                               Pertinent Vitals/Pain Pain Assessment: No/denies pain    Home Living Family/patient expects to be discharged to:: Skilled nursing facility Living Arrangements: Children Available Help at Discharge: Family Type of Home: House Home Access: Level entry     Home Layout: One level Home Equipment: Environmental consultant - 2 wheels;Cane - single point      Prior Function Level of Independence: Independent with assistive device(s)         Comments: was walking with walker more than cane recently     Hand Dominance        Extremity/Trunk Assessment   Upper Extremity Assessment Upper Extremity Assessment: Overall WFL for tasks assessed    Lower Extremity Assessment Lower Extremity Assessment: Generalized weakness    Cervical / Trunk Assessment Cervical / Trunk Assessment: Normal  Communication   Communication: No difficulties  Cognition Arousal/Alertness: Awake/alert Behavior During Therapy: WFL for tasks assessed/performed Overall Cognitive Status: Within Functional Limits for tasks assessed                                        General Comments      Exercises     Assessment/Plan    PT Assessment Patient needs continued PT services  PT Problem List Decreased strength;Decreased range of motion;Decreased activity tolerance;Decreased balance;Decreased mobility;Decreased coordination;Decreased  knowledge of use of DME;Decreased safety awareness;Cardiopulmonary status limiting activity;Obesity       PT Treatment Interventions DME instruction;Gait training;Functional mobility training;Therapeutic activities;Therapeutic exercise;Balance training;Neuromuscular re-education;Patient/family education    PT Goals (Current goals can be found in the Care Plan section)  Acute Rehab PT Goals Patient Stated Goal: to get stronger and get home PT Goal Formulation: With patient Time For Goal Achievement: 09/23/17 Potential to Achieve Goals: Good    Frequency Min 2X/week   Barriers to discharge Decreased caregiver support home with family assist PRN    Co-evaluation               AM-PAC PT "6 Clicks" Daily Activity  Outcome Measure Difficulty turning over in bed (including adjusting bedclothes, sheets and blankets)?: A Lot Difficulty moving from lying on back to sitting on the side of the bed? : Unable Difficulty sitting down on and standing up from a chair with arms (e.g., wheelchair, bedside commode, etc,.)?: Unable Help needed moving to and from a bed to chair (including a wheelchair)?: A Lot Help needed walking in hospital room?: A Little Help needed climbing 3-5 steps with a railing? : A Lot 6 Click Score: 11    End of Session Equipment Utilized During Treatment: Gait belt;Oxygen Activity Tolerance: Patient tolerated treatment well;Patient limited by fatigue;Treatment limited secondary to medical complications (Comment) Patient left: in chair;with call bell/phone within reach;with family/visitor present Nurse Communication: Mobility status PT Visit Diagnosis: Unsteadiness on feet (R26.81);Muscle weakness (generalized) (M62.81);Difficulty in walking, not elsewhere classified (R26.2)    Time: 8466-5993 PT Time Calculation (min) (ACUTE ONLY): 32 min   Charges:   PT Evaluation $PT Eval Moderate Complexity: 1 Mod PT Treatments $Gait Training: 8-22 mins   PT G Codes:    PT G-Codes **NOT FOR INPATIENT CLASS** Functional Assessment Tool Used: AM-PAC 6 Clicks Basic Mobility    Ramond Dial 09/09/2017, 5:10 PM  Mee Hives, PT MS Acute Rehab Dept. Number: [place] and [place]

## 2017-09-09 NOTE — Progress Notes (Deleted)
09/09/2017 10:19 AM   Pt was seen and examined.  He feels much better.  His vitals are stable and his home oxygen has been arranged.  He is stable for discharge home today.  Please see updated discharge summary.    Murvin Natal MD

## 2017-09-09 NOTE — Care Management Important Message (Signed)
Important Message  Patient Details  Name: IVAL PACER MRN: [id number] Date of Birth: 1951/05/11   Medicare Important Message Given:  Yes    Nikolaj Geraghty, Chauncey Reading, RN 09/09/2017, 10:17 AM

## 2017-09-09 NOTE — NC FL2 (Signed)
[place] LEVEL OF CARE SCREENING TOOL     IDENTIFICATION  Patient Name: [name] Birthdate: [birth year] Sex: male Admission Date (Current Location): 09/06/2017  [facility] and [place] Number:  [employer] and Address:  [place] [address] [place]      Provider Number: [id number]  Attending Physician Name and Address:  Murlean Iba, MD  Relative Name and Phone Number:       Current Level of Care: Hospital Recommended Level of Care: [place] Prior Approval Number:    Date Approved/Denied:   PASRR Number: [id number]  Discharge Plan: SNF    Current Diagnoses: Patient Active Problem List   Diagnosis Date Noted  . Atrial flutter ([place]) 09/07/2017  . Bilateral pneumonia 09/07/2017  . Acute respiratory failure with hypoxia ([place]) 05/19/2017  . Lobar pneumonia ([place]) 05/19/2017  . Sepsis due to undetermined organism ([place]) 05/19/2017  . AKI (acute kidney injury) ([place])   . Sepsis due to pneumonia ([place]) 05/18/2017  . Acute renal failure (ARF) ([place]) 05/18/2017  . Diabetes ([place]) 02/19/2017  . Essential hypertension 04/03/2015  . IDA (iron deficiency anemia)   . Mucosal abnormality of stomach   . History of colonic polyps   . Hx of adenomatous colonic polyps 06/13/2012  . Venous insufficiency 04/29/2011  . DLBCL (diffuse large B cell lymphoma) ([place]) 02/28/2009  . HYPERLIPIDEMIA-MIXED 02/28/2009  . OBESITY-MORBID (>100') 02/28/2009  . DIASTOLIC HEART FAILURE, CHRONIC 02/28/2009  . EDEMA 02/28/2009    Orientation RESPIRATION BLADDER Height & Weight     Self, Time, Situation, Place  O2(2L) Continent Weight: 277 lb 11.2 oz (126 kg) Height:  5\' 11"  (180.3 cm)  BEHAVIORAL SYMPTOMS/MOOD NEUROLOGICAL BOWEL NUTRITION STATUS      Continent Diet(Carb Modified)  AMBULATORY STATUS COMMUNICATION OF NEEDS Skin   Limited Assist Verbally Normal                       Personal Care Assistance Level of  Assistance  Bathing, Feeding, Dressing Bathing Assistance: Limited assistance Feeding assistance: Independent Dressing Assistance: Limited assistance     Functional Limitations Info  Sight, Hearing, Speech Sight Info: Adequate Hearing Info: Adequate Speech Info: Adequate    SPECIAL CARE FACTORS FREQUENCY  PT (By licensed PT)     PT Frequency: 5x/week              Contractures Contractures Info: Not present    Additional Factors Info  Code Status Code Status Info: Full Code             Current Medications (09/09/2017):  This is the current hospital active medication list Current Facility-Administered Medications  Medication Dose Route Frequency Provider Last Rate Last Dose  . apixaban (ELIQUIS) tablet 5 mg  5 mg Oral BID Herminio Commons, MD   5 mg at 09/09/17 0840  . doxycycline (VIBRA-TABS) tablet 100 mg  100 mg Oral Q12H Johnson, Clanford L, MD   100 mg at 09/09/17 1302  . insulin aspart (novoLOG) injection 0-9 Units  0-9 Units Subcutaneous TID WC Lama, Marge Duncans, MD      . ipratropium-albuterol (DUONEB) 0.5-2.5 (3) MG/3ML nebulizer solution 3 mL  3 mL Nebulization Q4H Johnson, Clanford L, MD   3 mL at 09/09/17 1211  . metoprolol tartrate (LOPRESSOR) tablet 25 mg  25 mg Oral BID Johnson, Clanford L, MD   25 mg at 09/09/17 0840  . predniSONE (DELTASONE) tablet 40 mg  40  mg Oral BID WC Johnson, Clanford L, MD   40 mg at 09/09/17 1302     Discharge Medications: Please see discharge summary for a list of discharge medications.  Relevant Imaging Results:  Relevant Lab Results:   Additional Information SSN [id number] Clydene Pugh, LCSW

## 2017-09-09 NOTE — Clinical Social Work Note (Signed)
Clinical Social Work Assessment  Patient Details  Name: [name] MRN: [id number] Date of Birth: 29-May-1951  Date of referral:  09/09/17               Reason for consult:  Facility Placement                Permission sought to share information with:    Permission granted to share information::     Name::        Agency::     Relationship::     Contact Information:  multiple family members were at bedside  Housing/Transportation Living arrangements for the past 2 months:  [place] of Information:  Adult Children, Siblings, Spouse Patient Interpreter Needed:  None Criminal Activity/Legal Involvement Pertinent to Current Situation/Hospitalization:  No - Comment as needed Significant Relationships:  Adult Children, Siblings, Spouse Lives with:    Do you feel safe going back to the place where you live?  Yes Need for family participation in patient care:  Yes (Comment)  Care giving concerns:  Patient is independent at baseline.    Social Worker assessment / plan:  Patient lives with his son. His wife is currently staying with their daughter. He states that once he completes rehab he will be moving in with his daughter as well. Patient is independent at baseline in ADL, drives and requires no assistance.  He is not on O2 at baseline. Patient is agreeable to SNF at this time.   Employment status:  Retired Forensic scientist:  Medicare PT Recommendations:  Not assessed at this time Information / Referral to community resources:     Patient/Family's Response to care:  Patient and family are agreeable to short term SNF.   Patient/Family's Understanding of and Emotional Response to Diagnosis, Current Treatment, and Prognosis:  Patient and family understand his diagnosis, treatment and prognosis and realize that patient is in need of SNF due to his deconditioned state.   Emotional Assessment Appearance:  Appears stated age Attitude/Demeanor/Rapport:     Affect (typically observed):  Accepting, Calm Orientation:  Oriented to Self, Oriented to Place, Oriented to  Time, Oriented to Situation Alcohol / Substance use:  Not Applicable Psych involvement (Current and /or in the community):  No (Comment)  Discharge Needs  Concerns to be addressed:  No discharge needs identified Readmission within the last 30 days:  Yes Current discharge risk:  None Barriers to Discharge:  No Barriers Identified   Ihor Gully, LCSW 09/09/2017, 1:17 PM

## 2017-09-09 NOTE — Care Management Note (Signed)
Case Management Note  Patient Details  Name: [name] MRN: [id number] Date of Birth: 02/15/1951    Expected Discharge Date:  09/09/17               Expected Discharge Plan:  Home/Self Care  In-House Referral:     Discharge planning Services  CM Consult  Post Acute Care Choice:  Durable Medical Equipment, Home Health Choice offered to:  Patient  DME Arranged:  Oxygen DME Agency:  [facility]  HH Arranged:  RN [place] Agency:  [place]  Status of Service:  Completed, signed off  If discussed at [place] of Stay Meetings, dates discussed:    Additional Comments: Patient discharging home today. Portable O2 tanks delivered to room by [employer]. Discussed HH RN, patient agreeable. Has had [facility] in the past, would like them again. Vaughan Basta of [facility] notified and will obtain orders when available. MD paged for orders. Zakye Baby, Chauncey Reading, RN 09/09/2017, 10:57 AM

## 2017-09-10 ENCOUNTER — Inpatient Hospital Stay
Admission: RE | Admit: 2017-09-10 | Discharge: 2017-09-21 | Disposition: A | Discharge status: Home / Self Care | Payer: Medicare Other | Source: Ambulatory Visit | Attending: Internal Medicine | Admitting: Internal Medicine

## 2017-09-10 DIAGNOSIS — A419 Sepsis, unspecified organism: Secondary | ICD-10-CM | POA: Diagnosis not present

## 2017-09-10 DIAGNOSIS — I4892 Unspecified atrial flutter: Secondary | ICD-10-CM | POA: Diagnosis not present

## 2017-09-10 DIAGNOSIS — E785 Hyperlipidemia, unspecified: Secondary | ICD-10-CM | POA: Diagnosis not present

## 2017-09-10 DIAGNOSIS — C8330 Diffuse large B-cell lymphoma, unspecified site: Secondary | ICD-10-CM | POA: Diagnosis not present

## 2017-09-10 DIAGNOSIS — Z5189 Encounter for other specified aftercare: Secondary | ICD-10-CM | POA: Diagnosis not present

## 2017-09-10 DIAGNOSIS — R0600 Dyspnea, unspecified: Secondary | ICD-10-CM | POA: Diagnosis not present

## 2017-09-10 DIAGNOSIS — R279 Unspecified lack of coordination: Secondary | ICD-10-CM | POA: Diagnosis not present

## 2017-09-10 DIAGNOSIS — E1122 Type 2 diabetes mellitus with diabetic chronic kidney disease: Secondary | ICD-10-CM | POA: Diagnosis not present

## 2017-09-10 DIAGNOSIS — Z9981 Dependence on supplemental oxygen: Secondary | ICD-10-CM | POA: Diagnosis not present

## 2017-09-10 DIAGNOSIS — M6281 Muscle weakness (generalized): Secondary | ICD-10-CM | POA: Diagnosis not present

## 2017-09-10 DIAGNOSIS — J189 Pneumonia, unspecified organism: Secondary | ICD-10-CM | POA: Diagnosis not present

## 2017-09-10 DIAGNOSIS — E119 Type 2 diabetes mellitus without complications: Secondary | ICD-10-CM | POA: Diagnosis not present

## 2017-09-10 DIAGNOSIS — N183 Chronic kidney disease, stage 3 (moderate): Secondary | ICD-10-CM | POA: Diagnosis not present

## 2017-09-10 DIAGNOSIS — I5032 Chronic diastolic (congestive) heart failure: Secondary | ICD-10-CM | POA: Diagnosis not present

## 2017-09-10 DIAGNOSIS — R262 Difficulty in walking, not elsewhere classified: Secondary | ICD-10-CM | POA: Diagnosis not present

## 2017-09-10 DIAGNOSIS — I10 Essential (primary) hypertension: Secondary | ICD-10-CM | POA: Diagnosis not present

## 2017-09-10 LAB — CULTURE, RESPIRATORY: CULTURE: NORMAL

## 2017-09-10 LAB — CULTURE, RESPIRATORY W GRAM STAIN

## 2017-09-10 LAB — GLUCOSE, CAPILLARY
GLUCOSE-CAPILLARY: 116 mg/dL — AB (ref 65–99)
GLUCOSE-CAPILLARY: 122 mg/dL — AB (ref 65–99)

## 2017-09-10 MED ORDER — IPRATROPIUM-ALBUTEROL 0.5-2.5 (3) MG/3ML IN SOLN: 3.0000 mL | Freq: Three times a day (TID) | RESPIRATORY_TRACT | Status: DC

## 2017-09-10 MED ORDER — PREDNISONE 20 MG PO TABS: 40.0000 mg | ORAL_TABLET | Freq: Every day | ORAL | 0 refills | Status: AC

## 2017-09-10 NOTE — Progress Notes (Signed)
09/10/2017 10:02 AM  Pt was seen and examined. He says he feels much better.  He is ready to go to SNF today.  He is breathing much better.   See updated discharge summary.    Murvin Natal MD

## 2017-09-10 NOTE — Clinical Social Work Placement (Signed)
   CLINICAL SOCIAL WORK PLACEMENT  NOTE  Date:  09/10/2017  Patient Details  Name: [name] MRN: [id number] Date of Birth: 15-Jan-1951  Clinical Social Work is seeking post-discharge placement for this patient at the [place] level of care (*CSW will initial, date and re-position this form in  chart as items are completed):  Yes   Patient/family provided with [place] Work Department's list of facilities offering this level of care within the geographic area requested by the patient (or if unable, by the patient's family).  Yes   Patient/family informed of their freedom to choose among providers that offer the needed level of care, that participate in Medicare, Medicaid or managed care program needed by the patient, have an available bed and are willing to accept the patient.  Yes   Patient/family informed of [place]'s ownership interest in [facility] and [facility], as well as of the fact that they are under no obligation to receive care at these facilities.  PASRR submitted to EDS on       PASRR number received on       Existing PASRR number confirmed on 09/09/17     FL2 transmitted to all facilities in geographic area requested by pt/family on 09/09/17     FL2 transmitted to all facilities within larger geographic area on       Patient informed that his/her managed care company has contracts with or will negotiate with certain facilities, including the following:        Yes   Patient/family informed of bed offers received.  Patient chooses bed at [facility]     Physician recommends and patient chooses bed at      Patient to be transferred to [facility] on 09/10/17.  Patient to be transferred to facility by [facility] staff     Patient family notified on 09/10/17 of transfer.  Name of family member notified:  Patinet's family is aware of discharge.      PHYSICIAN       Additional Comment:  Facility  notified. Discharge clinicals sent.   _______________________________________________ Ihor Gully, LCSW 09/10/2017, 10:07 AM

## 2017-09-10 NOTE — Progress Notes (Signed)
Physical Therapy Treatment Patient Details Name: [name] MRN: [id number] DOB: 1951/03/29 Today's Date: 09/10/2017    History of Present Illness 67 yo male with onset of PNA was admitted, has CHF suspected and cardiomegaly.  No previous use of O2 at home.  PMHx:  Lymphoma, asthma, bone marrow transplant, HTN, DM,     PT Comments    Patient limited for gait secondary to c/o fatigue and mild SOB, on room air with O2 sats between 90-94%, winded after walking and tolerated staying up in chair after therapy.  Patient will benefit from continued physical therapy in hospital and recommended venue below to increase strength, balance, endurance for safe ADLs and gait.    Follow Up Recommendations  SNF     Equipment Recommendations  None recommended by PT    Recommendations for Other Services       Precautions / Restrictions Precautions Precautions: Fall Restrictions Weight Bearing Restrictions: No    Mobility  Bed Mobility               General bed mobility comments: up in chair when PT arrived  Transfers Overall transfer level: Needs assistance Equipment used: Rolling walker (2 wheeled) Transfers: Sit to/from Stand Sit to Stand: Min guard         General transfer comment: demonstrates increased BLE strength for sit to stands  Ambulation/Gait Ambulation/Gait assistance: Min guard Ambulation Distance (Feet): 40 Feet Assistive device: Rolling walker (2 wheeled) Gait Pattern/deviations: Decreased step length - right;Decreased step length - left;Decreased stride length   Gait velocity interpretation: Below normal speed for age/gender General Gait Details: Patient demonstrates slighlty labored cadence without loss of balance, limited secondary to mild SOB while on room air with O2 sats dropping to 90%   Stairs            Wheelchair Mobility    Modified Rankin (Stroke Patients Only)       Balance Overall balance assessment: Needs  assistance Sitting-balance support: No upper extremity supported;Feet supported Sitting balance-Leahy Scale: Good     Standing balance support: Bilateral upper extremity supported;During functional activity Standing balance-Leahy Scale: Fair                              Cognition Arousal/Alertness: Awake/alert Behavior During Therapy: WFL for tasks assessed/performed Overall Cognitive Status: Within Functional Limits for tasks assessed                                        Exercises General Exercises - Lower Extremity Ankle Circles/Pumps: Seated;AROM;Strengthening;Both;10 reps Long Arc Quad: Seated;AROM;Strengthening;Both;10 reps Hip Flexion/Marching: Seated;AROM;Strengthening;Both;10 reps    General Comments        Pertinent Vitals/Pain Pain Assessment: No/denies pain    Home Living                      Prior Function            PT Goals (current goals can now be found in the care plan section) Acute Rehab PT Goals Patient Stated Goal: to get stronger and get home PT Goal Formulation: With patient Time For Goal Achievement: 09/23/17 Potential to Achieve Goals: Good Progress towards PT goals: Progressing toward goals    Frequency    Min 3X/week      PT Plan Current plan remains appropriate    Co-evaluation  AM-PAC PT "6 Clicks" Daily Activity  Outcome Measure  Difficulty turning over in bed (including adjusting bedclothes, sheets and blankets)?: A Little Difficulty moving from lying on back to sitting on the side of the bed? : A Little Difficulty sitting down on and standing up from a chair with arms (e.g., wheelchair, bedside commode, etc,.)?: A Little Help needed moving to and from a bed to chair (including a wheelchair)?: A Little Help needed walking in hospital room?: A Little Help needed climbing 3-5 steps with a railing? : A Lot 6 Click Score: 17    End of Session   Activity Tolerance:  Patient tolerated treatment well;Patient limited by fatigue Patient left: in chair;with call bell/phone within reach Nurse Communication: Mobility status PT Visit Diagnosis: Unsteadiness on feet (R26.81);Muscle weakness (generalized) (M62.81);Difficulty in walking, not elsewhere classified (R26.2)     Time: 3846-6599 PT Time Calculation (min) (ACUTE ONLY): 22 min  Charges:  $Therapeutic Activity: 8-22 mins                    G Codes:       11:01 AM, September 22, 2017 Lonell Grandchild, MPT Physical Therapist with [facility] [phone] office [phone] mobile phone

## 2017-09-10 NOTE — Progress Notes (Signed)
Pt's IV catheter removed and intact. Pt's IV site clean dry and intact. Report called and given to [place]. All questions were answered and no further questions at this time. Pt in stable condition and in no acute distress at time of discharge. Pt will be escorted by nurse tech.

## 2017-09-11 LAB — CULTURE, BLOOD (ROUTINE X 2)
CULTURE: NO GROWTH
Culture: NO GROWTH
SPECIAL REQUESTS: ADEQUATE
Special Requests: ADEQUATE

## 2017-09-13 ENCOUNTER — Encounter: Payer: Self-pay | Admitting: Internal Medicine

## 2017-09-13 ENCOUNTER — Non-Acute Institutional Stay (SKILLED_NURSING_FACILITY): Payer: Medicare Other | Admitting: Internal Medicine

## 2017-09-13 DIAGNOSIS — J189 Pneumonia, unspecified organism: Secondary | ICD-10-CM | POA: Diagnosis not present

## 2017-09-13 DIAGNOSIS — I10 Essential (primary) hypertension: Secondary | ICD-10-CM

## 2017-09-13 DIAGNOSIS — I5032 Chronic diastolic (congestive) heart failure: Secondary | ICD-10-CM

## 2017-09-13 DIAGNOSIS — A419 Sepsis, unspecified organism: Secondary | ICD-10-CM | POA: Diagnosis not present

## 2017-09-13 DIAGNOSIS — N183 Chronic kidney disease, stage 3 (moderate): Secondary | ICD-10-CM | POA: Diagnosis not present

## 2017-09-13 DIAGNOSIS — I4892 Unspecified atrial flutter: Secondary | ICD-10-CM | POA: Diagnosis not present

## 2017-09-13 DIAGNOSIS — E1122 Type 2 diabetes mellitus with diabetic chronic kidney disease: Secondary | ICD-10-CM

## 2017-09-13 NOTE — Progress Notes (Signed)
Provider:  Veleta Miners Location:   [facility] Room Number: 159/P Place of Service:  SNF (31)  PCP: Redmond School, MD Patient Care Team: Redmond School, MD as PCP - General (Internal Medicine) Gala Romney Cristopher Estimable, MD (Gastroenterology)  Extended Emergency Contact Information Primary Emergency Contact: Gunnerson,[name] Address: [address]          [place], [place] [address] [place] Phone: [phone] Mobile Phone: [phone] Relation: Spouse Secondary Emergency Contact: Kirt Boys [place] Mobile Phone: [phone] Relation: Daughter  Code Status: Full Code Goals of Care: Advanced Directive information Advanced Directives 09/13/2017  Does Patient Have a Medical Advance Directive? Yes  Type of Advance Directive (No Data)  Does patient want to make changes to medical advance directive? No - Patient declined  Would patient like information on creating a medical advance directive? No - Patient declined  Pre-existing out of facility DNR order (yellow form or pink MOST form) -      Chief Complaint  Patient presents with  . New Admit To SNF    New Admission Visit    HPI: Patient is a 67 y.o. male seen today for admission to SNF for Therapy after hospitalization for Bilateral Pneumonia From 01/14-01/18  Patient has a history of NIDDM, hypertension, hyperlipidemia, history of non-Hodgkin's lymphoma status post bone marrow transplant in 2007.  He came to the emergency room with fever cough and weakness.  He had chest x-ray show bilateral infiltrates suspicious for pneumonia.  He was treated with antibiotics and is now discharged on doxycycline for 7 days. He also has a new diagnosis of atrial flutter.  Per cardiology he was started on Lopressor and Eliquis. Patient is doing very well in the facility.  He says his cough is better.  He is off his oxygen.  He walks with a walker today with therapy. Patient lives with  his son.  He said he was active and driving before.  He plans to go home    Past Medical History:  Diagnosis Date  . Asthma   . Diabetes mellitus without complication ([place])   . DLBCL (diffuse large B cell lymphoma) ([place]) 02/28/2009  . Edema   . Heart failure, diastolic, chronic ([place])    patient denies  . Hyperlipidemia, mixed   . Hypertension   . IDA (iron deficiency anemia)   . 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 Obesity, morbid (more than 100 lbs over ideal weight or BMI > 40) ([facility])   . Venous insufficiency 04/29/2011   Past Surgical History:  Procedure Laterality Date  . BACK SURGERY  2006   T6 vertebrae removed/titanium placed  . COLONOSCOPY  11/24/2004   Polyps in the left colon ablated/removed as described above.  Two submucosal lesions consistent with lipomas as described above not  manipulated./ Normal rectum  . COLONOSCOPY  06/20/2012   MULTIPLE RECTAL AND COLONIC POLYPS  . COLONOSCOPY N/A 03/08/2015   RMR: Capacious, redundant colon. Multiple colonic and rectosigmoid polyps removed. ablated as described above. colonic lipoma abnormal appearing terminal ileum likely a variant of normal). Howeverwith history  biopsies obtained.   . ESOPHAGOGASTRODUODENOSCOPY N/A 03/08/2015   RMR: Hiatal hernia Polypoid gastric mucosa with multiple gastric polyps. largest polyp removed via snare polypectomgy hemostasis clip placed at base. Status post gastric biopsy. Status post video capsule placement.   Marland Kitchen GIVENS CAPSULE STUDY N/A 03/08/2015   Procedure: GIVENS CAPSULE STUDY;  Surgeon: Cristopher Estimable  Rourk, MD;  Location: [facility];  Service: Endoscopy;  Laterality: N/A;  . LIMBAL STEM CELL TRANSPLANT    . LUNG BIOPSY  6/06  . MULTIPLE TOOTH EXTRACTIONS  04/2005  . port a cath placement    . PORT-A-CATH REMOVAL  09/08/2012   Procedure: REMOVAL PORT-A-CATH;  Surgeon: Melrose Nakayama, MD;  Location: [facility];  Service: Thoracic;  Laterality: N/A;     reports that he quit smoking about 14 years ago. He has a 7.50 pack-year smoking history. he has never used smokeless tobacco. He reports that he does not drink alcohol or use drugs. Social History   Socioeconomic History  . Marital status: Married    Spouse name: Not on file  . Number of children: 2  . Years of education: Not on file  . Highest education level: Not on file  Social Needs  . Financial resource strain: Not on file  . Food insecurity - worry: Not on file  . Food insecurity - inability: Not on file  . Transportation needs - medical: Not on file  . Transportation needs - non-medical: Not on file  Occupational History  . Occupation: disability due to back    Employer: UNEMPLOYED  Tobacco Use  . Smoking status: Former Smoker    Packs/day: 0.50    Years: 15.00    Pack years: 7.50    Last attempt to quit: 11/11/2002    Years since quitting: 14.8  . Smokeless tobacco: Never Used  Substance and Sexual Activity  . Alcohol use: No    Alcohol/week: 0.0 oz  . Drug use: No  . Sexual activity: Not on file  Other Topics Concern  . Not on file  Social History Narrative   Married   No regular exercise    Functional Status Survey:    Family History  Problem Relation Age of Onset  . Cancer Mother        lung  . Cancer Father        prostate  . Colon cancer Neg Hx   . Diabetes Neg Hx     Health Maintenance  Topic Date Due  . INFLUENZA VACCINE  10/14/2017 (Originally 03/24/2017)  . FOOT EXAM  10/14/2017 (Originally 04/23/1962)  . OPHTHALMOLOGY EXAM  10/14/2017 (Originally 04/23/1962)  . URINE MICROALBUMIN  10/14/2017 (Originally 04/23/1962)  . TETANUS/TDAP  10/14/2017 (Originally 04/24/1971)  . Hepatitis C Screening  10/14/2017 (Originally 05/20/1951)  . PNA vac Low Risk Adult (1 of 2 - PCV13) 10/14/2017 (Originally 04/23/2017)  . HEMOGLOBIN A1C  03/06/2018  . COLONOSCOPY  03/07/2025  . HIV Screening  Completed    Allergies  Allergen Reactions  . Penicillins Rash     Has patient had a PCN reaction causing immediate rash, facial/tongue/throat swelling, SOB or lightheadedness with hypotension: Unknown Has patient had a PCN reaction causing severe rash involving mucus membranes or skin necrosis: Unknown Has patient had a PCN reaction that required hospitalization: Unknown Has patient had a PCN reaction occurring within the last 10 years: No If all of the above answers are "NO", then may proceed with Cephalosporin use.     Outpatient Encounter Medications as of 09/13/2017  Medication Sig  . apixaban (ELIQUIS) 5 MG TABS tablet Take 1 tablet (5 mg total) by mouth 2 (two) times daily.  Marland Kitchen doxycycline (VIBRAMYCIN) 100 MG capsule Take 1 capsule (100 mg total) by mouth 2 (two) times daily for 7 days.  Marland Kitchen glimepiride (AMARYL) 1 MG tablet take 1/2 tablet by mouth once  daily WITH BREAKFAST  . ipratropium-albuterol (DUONEB) 0.5-2.5 (3) MG/3ML SOLN Take 3 mLs by nebulization every 8 (eight) hours.  . metoprolol tartrate (LOPRESSOR) 25 MG tablet Take 1 tablet (25 mg total) by mouth 2 (two) times daily.  . predniSONE (DELTASONE) 20 MG tablet Take 2 tablets (40 mg total) by mouth daily with breakfast for 5 days.  . [DISCONTINUED] ibuprofen (ADVIL,MOTRIN) 200 MG tablet Take 200 mg by mouth daily as needed for headache.    No facility-administered encounter medications on file as of 09/13/2017.      Review of Systems  Review of Systems  Constitutional: Negative for activity change, appetite change, chills, diaphoresis, fatigue and fever.  HENT: Negative for mouth sores, postnasal drip, rhinorrhea, sinus pain and sore throat.   Respiratory: Negative for apnea, cough, chest tightness, shortness of breath and wheezing.   Cardiovascular: Negative for chest pain, palpitations and leg swelling.  Gastrointestinal: Negative for abdominal distention, abdominal pain, constipation, diarrhea, nausea and vomiting.  Genitourinary: Negative for dysuria and frequency.  Musculoskeletal:  Negative for arthralgias, joint swelling and myalgias.  Skin: Negative for rash.  Neurological: Negative for dizziness, syncope, weakness, light-headedness and numbness.  Psychiatric/Behavioral: Negative for behavioral problems, confusion and sleep disturbance.     Vitals:   09/13/17 1124  BP: 133/84  Pulse: 86  Resp: 20  Temp: 97.8 F (36.6 C)  TempSrc: Oral   There is no height or weight on file to calculate BMI. Physical Exam  Constitutional: He appears well-developed and well-nourished.  HENT:  Head: Normocephalic.  Mouth/Throat: Oropharynx is clear and moist.  Eyes: Pupils are equal, round, and reactive to light.  Neck: Neck supple.  Cardiovascular: Normal rate, regular rhythm and normal heart sounds.  No murmur heard. Pulmonary/Chest: Effort normal.  Bilateral Rales in the Bases.  Abdominal: Soft. Bowel sounds are normal. He exhibits no distension. There is no tenderness. There is no rebound.  Musculoskeletal:  Edema with Chronic Venous Changes Bilateral.  Lymphadenopathy:    He has no cervical adenopathy.  Skin: Skin is warm and dry.  Psychiatric: He has a normal mood and affect. His behavior is normal. Thought content normal.    Labs reviewed: Basic Metabolic Panel: Recent Labs    05/21/17 0608 09/06/17 1910 09/07/17 0421 09/08/17 0515  NA 138 137 136 138  K 3.7 4.4 3.6 4.1  CL 104 103 106 105  CO2 25 23 20* 22  GLUCOSE 217* 94 96 87  BUN 21* 10 9 9   CREATININE 1.12 1.04 0.90 0.91  CALCIUM 8.5* 9.2 8.3* 9.1  MG 2.1  --   --  2.0   Liver Function Tests: Recent Labs    05/18/17 1339 09/06/17 1910 09/07/17 0421  AST 18 30 17   ALT 21 21 15*  ALKPHOS 60 73 57  BILITOT 1.1 1.0 0.7  PROT 7.8 7.4 6.1*  ALBUMIN 3.8 3.8 3.2*   Recent Labs    05/18/17 1339  LIPASE 21   No results for input(s): AMMONIA in the last 8760 hours. CBC: Recent Labs    05/10/17 1110  05/19/17 0444  09/06/17 1910 09/07/17 0421 09/08/17 0515  WBC 4.3   < > 14.0*    < > 5.7 4.4 4.5  NEUTROABS 2.5  --  11.5*  --  4.2  --   --   HGB 13.6   < > 13.1   < > 14.5 12.5* 13.5  HCT 38.4*   < > 38.3*   < > 43.5 38.3* 41.6  MCV 92.5   < > 93.6   < > 91.8 92.3 92.4  PLT 162   < > 179   < > 166 160 169   < > = values in this interval not displayed.   Cardiac Enzymes: Recent Labs    05/18/17 1339 05/19/17 1040 05/19/17 1616 05/21/17 0608  CKTOTAL  --  984*  --  419*  TROPONINI <0.03 <0.03 <0.03  --    BNP: Invalid input(s): POCBNP Lab Results  Component Value Date   HGBA1C 5.3 09/06/2017   Lab Results  Component Value Date   TSH 1.122 09/08/2017   Lab Results  Component Value Date   EUMPNTIR44 315 08/15/2015   Lab Results  Component Value Date   FOLATE 21.5 08/15/2015   Lab Results  Component Value Date   IRON 69 05/10/2017   TIBC 329 05/10/2017   FERRITIN 90 05/10/2017    Imaging and Procedures obtained prior to SNF admission: No results found.  Assessment/Plan  Bilateral pneumonia with sepsis Patient feeling better Finishing a course of doxycycline He is weaned off his oxygen He is also on prednisone for a few more days Repeat chest x-ray in 4 weeks to follow the infiltrate per radiology. To be noted patient had bilateral pneumonia in 09/18 also.  But did have a CT scan of his chest at that time.  Patient denies any problems with swallowing.  Atrial flutter new diagnosis His Lopressor was increased to 25 twice daily and he was started on Eliquis 5 mg twice daily Follow-up with cardiology in 2 weeks Diabetes type 2 Continue Amaryl Blood sugars mostly less than 200 A1c was 5.3 in the hospital Hypertension Continue on metoprolol 25 twice daily Discharge planning Patient will be discharged home.  Family/ staff Communication:   Labs/tests ordered: Total time spent in this patient care encounter was 45_ minutes; greater than 50% of the visit spent counseling patient, reviewing records , Labs and coordinating care for  problems addressed at this encounter.

## 2017-09-20 ENCOUNTER — Encounter: Payer: Self-pay | Admitting: Internal Medicine

## 2017-09-20 ENCOUNTER — Non-Acute Institutional Stay (SKILLED_NURSING_FACILITY): Payer: Medicare Other | Admitting: Internal Medicine

## 2017-09-20 ENCOUNTER — Encounter (HOSPITAL_COMMUNITY)
Admission: RE | Admit: 2017-09-20 | Discharge: 2017-09-20 | Disposition: A | Discharge status: Home / Self Care | Payer: Medicare Other | Source: Skilled Nursing Facility | Attending: Internal Medicine | Admitting: Internal Medicine

## 2017-09-20 DIAGNOSIS — N183 Chronic kidney disease, stage 3 unspecified: Secondary | ICD-10-CM

## 2017-09-20 DIAGNOSIS — I4892 Unspecified atrial flutter: Secondary | ICD-10-CM | POA: Diagnosis not present

## 2017-09-20 DIAGNOSIS — E1122 Type 2 diabetes mellitus with diabetic chronic kidney disease: Secondary | ICD-10-CM

## 2017-09-20 DIAGNOSIS — J189 Pneumonia, unspecified organism: Secondary | ICD-10-CM

## 2017-09-20 DIAGNOSIS — I10 Essential (primary) hypertension: Secondary | ICD-10-CM

## 2017-09-20 DIAGNOSIS — Z9481 Bone marrow transplant status: Secondary | ICD-10-CM | POA: Insufficient documentation

## 2017-09-20 LAB — COMPREHENSIVE METABOLIC PANEL
ALK PHOS: 61 U/L (ref 38–126)
ALT: 31 U/L (ref 17–63)
ANION GAP: 8 (ref 5–15)
AST: 24 U/L (ref 15–41)
Albumin: 3.4 g/dL — ABNORMAL LOW (ref 3.5–5.0)
BILIRUBIN TOTAL: 0.5 mg/dL (ref 0.3–1.2)
BUN: 16 mg/dL (ref 6–20)
CALCIUM: 8.7 mg/dL — AB (ref 8.9–10.3)
CO2: 25 mmol/L (ref 22–32)
CREATININE: 0.91 mg/dL (ref 0.61–1.24)
Chloride: 106 mmol/L (ref 101–111)
GFR calc Af Amer: >60 mL/min (ref >60)
GFR calc non Af Amer: >60 mL/min (ref >60)
GLUCOSE: 114 mg/dL — AB (ref 65–99)
Potassium: 4.3 mmol/L (ref 3.5–5.1)
SODIUM: 139 mmol/L (ref 135–145)
TOTAL PROTEIN: 6.2 g/dL — AB (ref 6.5–8.1)

## 2017-09-20 LAB — CBC WITH DIFFERENTIAL/PLATELET
Basophils Absolute: 0 10*3/uL (ref 0.0–0.1)
Basophils Relative: 0 %
Eosinophils Absolute: 0.3 10*3/uL (ref 0.0–0.7)
Eosinophils Relative: 7 %
HEMATOCRIT: 43.3 % (ref 39.0–52.0)
HEMOGLOBIN: 13.6 g/dL (ref 13.0–17.0)
Lymphocytes Relative: 26 %
Lymphs Abs: 0.9 10*3/uL (ref 0.7–4.0)
MCH: 29.4 pg (ref 26.0–34.0)
MCHC: 31.4 g/dL (ref 30.0–36.0)
MCV: 93.7 fL (ref 78.0–100.0)
MONOS PCT: 16 %
Monocytes Absolute: 0.5 10*3/uL (ref 0.1–1.0)
NEUTROS ABS: 1.7 10*3/uL (ref 1.7–7.7)
NEUTROS PCT: 51 %
Platelets: 181 10*3/uL (ref 150–400)
RBC: 4.62 MIL/uL (ref 4.22–5.81)
RDW: 14.9 % (ref 11.5–15.5)
WBC: 3.4 10*3/uL — AB (ref 4.0–10.5)

## 2017-09-20 NOTE — Progress Notes (Signed)
Location:   [facility] Room Number: 159/P Place of Service:  SNF (31)  Provider: Granville Lewis  PCP: Redmond School, MD Patient Care Team: Redmond School, MD as PCP - General (Internal Medicine) Gala Romney Cristopher Estimable, MD (Gastroenterology)  Extended Emergency Contact Information Primary Emergency Contact: Begue,[name] Address: [address]          [place], [place] [address] Johnnette Litter of [place] Phone: [phone] Mobile Phone: [phone] Relation: Spouse Secondary Emergency Contact: Kirt Boys [place] of [place] Mobile Phone: [phone] Relation: Daughter  Code Status: Full Code Goals of care:  Advanced Directive information Advanced Directives 09/20/2017  Does Patient Have a Medical Advance Directive? Yes  Type of Advance Directive (No Data)  Does patient want to make changes to medical advance directive? No - Patient declined  Would patient like information on creating a medical advance directive? No - Patient declined  Pre-existing out of facility DNR order (yellow form or pink MOST form) -     Allergies  Allergen Reactions  . Penicillins Rash    Has patient had a PCN reaction causing immediate rash, facial/tongue/throat swelling, SOB or lightheadedness with hypotension: Unknown Has patient had a PCN reaction causing severe rash involving mucus membranes or skin necrosis: Unknown Has patient had a PCN reaction that required hospitalization: Unknown Has patient had a PCN reaction occurring within the last 10 years: No If all of the above answers are "NO", then may proceed with Cephalosporin use.     Chief Complaint  Patient presents with  . Discharge Note    Discharge Visit    HPI:  67 y.o. male seen today for discharge from facility tomorrow  Patient was here for rehab after hospital admission for bilateral pneumonia earlier this month.  He also has a history of non-insulin-dependent diabetes as well as hypertension  hyperlipidemia history of non-Hodgkin's lymphoma status post bone marrow transplant back in 2007.  He came to the ER with fever cough and weakness x-ray showed bilateral infiltrates-history with antibiotics and has completed a course of doxycycline here in the facility.  He also has a new diagnosis of atrial flutter as per cardiology he was started on Lopressor and Eliquis and this appears to be rate controlled.  He continues to do quite well in the facility he has a minimal cough he says he does not complain of shortness of breath he is off oxygen now doing well  He does have a history of diabetes type 2 on Glimepiride--CBGs appear to be largely in the lower mid 100s  Patient will be going home with his daughter and wife he will need continued PT and OT for strengthening with some debility he also has cardiology follow-up    Past Medical History:  Diagnosis Date  . Asthma   . Diabetes mellitus without complication ([facility])   . DLBCL (diffuse large B cell lymphoma) ([facility]) 02/28/2009  . Edema   . Heart failure, diastolic, chronic ([facility])    patient denies  . Hyperlipidemia, mixed   . Hypertension   . IDA (iron deficiency anemia)   . Large cell lymphoma ([facility]) 01/2005   autologous stem cell transplant 12/2005  . Obesity, morbid (more than 100 lbs over ideal weight or BMI > 40) ([facility])   . Venous insufficiency 04/29/2011    Past Surgical History:  Procedure Laterality Date  . BACK SURGERY  2006   T6 vertebrae removed/titanium placed  . COLONOSCOPY  11/24/2004   Polyps in the left colon ablated/removed  as described above.  Two submucosal lesions consistent with lipomas as described above not  manipulated./ Normal rectum  . COLONOSCOPY  06/20/2012   MULTIPLE RECTAL AND COLONIC POLYPS  . COLONOSCOPY N/A 03/08/2015   RMR: Capacious, redundant colon. Multiple colonic and rectosigmoid polyps removed. ablated as described above. colonic lipoma abnormal appearing terminal ileum likely a variant of  normal). Howeverwith history  biopsies obtained.   . ESOPHAGOGASTRODUODENOSCOPY N/A 03/08/2015   RMR: Hiatal hernia Polypoid gastric mucosa with multiple gastric polyps. largest polyp removed via snare polypectomgy hemostasis clip placed at base. Status post gastric biopsy. Status post video capsule placement.   Marland Kitchen GIVENS CAPSULE STUDY N/A 03/08/2015   Procedure: GIVENS CAPSULE STUDY;  Surgeon: Daneil Dolin, MD;  [facility];  Service: Endoscopy;  Laterality: N/A;  . LIMBAL STEM CELL TRANSPLANT    . LUNG BIOPSY  6/06  . MULTIPLE TOOTH EXTRACTIONS  04/2005  . port a cath placement    . PORT-A-CATH REMOVAL  09/08/2012   Procedure: REMOVAL PORT-A-CATH;  Surgeon: Melrose Nakayama, MD;  Location: [facility];  Service: Thoracic;  Laterality: N/A;      reports that he quit smoking about 14 years ago. He has a 7.50 pack-year smoking history. he has never used smokeless tobacco. He reports that he does not drink alcohol or use drugs. Social History   Socioeconomic History  . Marital status: Married    Spouse name: Not on file  . Number of children: 2  . Years of education: Not on file  . Highest education level: Not on file  Social Needs  . Financial resource strain: Not on file  . Food insecurity - worry: Not on file  . Food insecurity - inability: Not on file  . Transportation needs - medical: Not on file  . Transportation needs - non-medical: Not on file  Occupational History  . Occupation: disability due to back    Employer: UNEMPLOYED  Tobacco Use  . Smoking status: Former Smoker    Packs/day: 0.50    Years: 15.00    Pack years: 7.50    Last attempt to quit: 11/11/2002    Years since quitting: 14.8  . Smokeless tobacco: Never Used  Substance and Sexual Activity  . Alcohol use: No    Alcohol/week: 0.0 oz  . Drug use: No  . Sexual activity: Not on file  Other Topics Concern  . Not on file  Social History Narrative   Married   No regular exercise   Functional Status  Survey:    Allergies  Allergen Reactions  . Penicillins Rash    Has patient had a PCN reaction causing immediate rash, facial/tongue/throat swelling, SOB or lightheadedness with hypotension: Unknown Has patient had a PCN reaction causing severe rash involving mucus membranes or skin necrosis: Unknown Has patient had a PCN reaction that required hospitalization: Unknown Has patient had a PCN reaction occurring within the last 10 years: No If all of the above answers are "NO", then may proceed with Cephalosporin use.     Pertinent  Health Maintenance Due  Topic Date Due  . INFLUENZA VACCINE  10/14/2017 (Originally 03/24/2017)  . FOOT EXAM  10/14/2017 (Originally 04/23/1961)  . OPHTHALMOLOGY EXAM  10/14/2017 (Originally 04/23/1961)  . URINE MICROALBUMIN  10/14/2017 (Originally 04/23/1961)  . PNA vac Low Risk Adult (1 of 2 - PCV13) 10/14/2017 (Originally 04/23/2017)  . HEMOGLOBIN A1C  03/06/2018  . COLONOSCOPY  03/07/2025    Medications: Outpatient Encounter Medications as of 09/20/2017  Medication Sig  . apixaban (ELIQUIS) 5 MG TABS tablet Take 1 tablet (5 mg total) by mouth 2 (two) times daily.  Marland Kitchen glimepiride (AMARYL) 1 MG tablet take 1/2 tablet by mouth once daily WITH BREAKFAST  . ipratropium-albuterol (DUONEB) 0.5-2.5 (3) MG/3ML SOLN Take 3 mLs by nebulization every 8 (eight) hours.  . metoprolol tartrate (LOPRESSOR) 25 MG tablet Take 1 tablet (25 mg total) by mouth 2 (two) times daily.   No facility-administered encounter medications on file as of 09/20/2017.      Review of Systems   In general is not complaining of any fever chills weight appears to be stable.  Skin does not complain of rashes or itching  .  Head ears eyes nose mouth and throat does not complain of visual changes or sore throat.  Respiratory denies shortness of breath says he has a mild residual cough which is improving.  Cardiac does not complain of chest pain or palpitations does have a history of atrial  flutter--has chronic venous stasis edema.  GI does not complain of abdominal discomfort nausea vomiting diarrhea or constipation.  Musculoskeletal is not complaining of joint pain.  Neurologic does not complain of dizziness headache syncope or numbness.  Psych does not complain of any anxiety or depression appears to be in good spirits looking forward to going home   He is afebrile pulse of 80 respirations of 18 blood pressure manually 140/90 Physical Exam   In general this is a very pleasant elderly male in no distress.  His skin is warm and dry he does have venous stasis changes to lower extremities bilaterally.  Eyes visual acuity appears grossly intact.  Oropharynx clear mucous membranes moist.  Chest is clear to auscultation with somewhat shallow air entry there is no labored breathing.  Heart is regular rate and rhythm with occasional irregular beats he has chronic edema with venous stasis changes bilaterally.  Abdomen is obese soft nontender with positive bowel sounds.  Musculoskeletal moves all extremities x4 and appears at baseline strength appears to be intact all 4 extremities.  Neurologic is grossly intact no lateralizing findings his speech is clear.  Psych he is alert and oriented pleasant and appropriate.  Back  Labs reviewed: Basic Metabolic Panel: Recent Labs    05/21/17 0608  09/07/17 0421 09/08/17 0515 09/20/17 0330  NA 138   < > 136 138 139  K 3.7   < > 3.6 4.1 4.3  CL 104   < > 106 105 106  CO2 25   < > 20* 22 25  GLUCOSE 217*   < > 96 87 114*  BUN 21*   < > 9 9 16   CREATININE 1.12   < > 0.90 0.91 0.91  CALCIUM 8.5*   < > 8.3* 9.1 8.7*  MG 2.1  --   --  2.0  --    < > = values in this interval not displayed.   Liver Function Tests: Recent Labs    09/06/17 1910 09/07/17 0421 09/20/17 0330  AST 30 17 24   ALT 21 15* 31  ALKPHOS 73 57 61  BILITOT 1.0 0.7 0.5  PROT 7.4 6.1* 6.2*  ALBUMIN 3.8 3.2* 3.4*   Recent Labs    05/18/17 1339    LIPASE 21   No results for input(s): AMMONIA in the last 8760 hours. CBC: Recent Labs    05/19/17 0444  09/06/17 1910 09/07/17 0421 09/08/17 0515 09/20/17 0330  WBC 14.0*   < > 5.7 4.4  4.5 3.4*  NEUTROABS 11.5*  --  4.2  --   --  1.7  HGB 13.1   < > 14.5 12.5* 13.5 13.6  HCT 38.3*   < > 43.5 38.3* 41.6 43.3  MCV 93.6   < > 91.8 92.3 92.4 93.7  PLT 179   < > 166 160 169 181   < > = values in this interval not displayed.   Cardiac Enzymes: Recent Labs    05/18/17 1339 05/19/17 1040 05/19/17 1616 05/21/17 0608  CKTOTAL  --  984*  --  419*  TROPONINI <0.03 <0.03 <0.03  --    BNP: Invalid input(s): POCBNP CBG: Recent Labs    09/09/17 2046 09/10/17 0804 09/10/17 1144  GLUCAP 125* 116* 122*    Procedures and Imaging Studies During Stay: Dg Chest 2 View  Result Date: 09/06/2017 CLINICAL DATA:  Cough fever and short of breath EXAM: CHEST  2 VIEW COMPARISON:  CT chest 05/18/2017, chest radiograph 05/18/2017 FINDINGS: Suspected small pleural effusion. Multifocal airspace opacities within the bilateral lung bases and left mid lung. Stable cardiomegaly with vascular congestion. No pneumothorax. Surgical changes of the thoracic spine. IMPRESSION: Suspected small pleural effusions with multifocal bilateral airspace opacities suspicious for pneumonia. Radiographic follow-up to resolution is recommended Mild cardiomegaly with vascular congestion Electronically Signed   By: Donavan Foil M.D.   On: 09/06/2017 19:13   Dg Chest Port 1 View  Result Date: 09/09/2017 CLINICAL DATA:  Increase shortness of breath, asthma, lymphoma, former smoking history EXAM: PORTABLE CHEST 1 VIEW COMPARISON:  Chest x-ray of 09/06/2017 FINDINGS: The lungs of the again are poorly aerated and there is right basilar parenchymal opacity present. There is some pulmonary vascular congestion, and a passage at the right base could also be due to effusion although pneumonia cannot be excluded. Cardiomegaly is stable.  Corpectomy is noted in the mid upper thoracic spine. IMPRESSION: 1. Question CHF with cardiomegaly, pulmonary vascular congestion and possible small right effusion. 2. Cannot exclude pneumonia at the right lung base. Electronically Signed   By: Ivar Drape M.D.   On: 09/09/2017 12:04    Assessment/Plan:    #1 history of bilateral pneumonia with sepsis patient appears to be doing very well in this regards he says his cough is now minimal does not complain of shortness of breath he does have a follow-up chest x-ray first week of March.  He no longer requires oxygen is completed prednisone and doxycycline-at this point will defer to primary care provider for follow-up he does have orders for duo nebs every 8 hours with.  2.  History of atrial flutter which is a new diagnosis this appears to be rate controlled on Lopressor his Lopressor was increased recently-he is also on Eliquis for anticoagulation-he does have cardiology follow-up.  .  3.  History of type 2 diabetes he is on Amaryl as noted above this appears well controlled with blood sugars largely in the lower mid 100s.  Hemoglobin A1c was 5.3 in the hospital.  4.  Hypertension  this appears to be relatively stable he does have some variable readings 140/90 manually today --see previous reading 136/78- 164/85-at this point since he is about to leave will defer to primary care provider as well as cardiology for any changes.  Again clinically he appears to be quite stable he will need PT and OT for further strengthening but appears to be doing well he will need again expedient follow-up by cardiology as well as his primary care provider  will need a follow-up chest x-ray the first week of March.  [id number] note greater than 30 minutes spent on this discharge summary-greater than 50% of time spent coordinating a plan of care for numerous diagnoses

## 2017-09-21 NOTE — Progress Notes (Signed)
Cardiology Office Note    Date:  09/23/2017   ID:  [name], DOB May 30, 1951, MRN [id number]  PCP:  Redmond School, MD  Cardiologist: Dr. Bronson Ing  Chief Complaint  Patient presents with  . Hospitalization Follow-up    History of Present Illness:    [name] is a 67 y.o. male with past medical history of diffuse large B-cell lymphoma (in remission), HTN, and Type 2 DM who presents to the office today for hospital follow-up.   He was recently admitted to [facility] 09/06/2017 for evaluation of a productive cough and progressive weakness over the past several days. A CXR was obtained and showed small pleural effusions with multifocal bilateral airspace opacities concerning for pneumonia. EKG at time of admission showed atrial flutter with a heart rate of 108. He was admitted for treatment of his pneumonia and Cardiology was consulted in regards to new onset atrial flutter. TSH and electrolytes were WNL. An echocardiogram was obtained and showed a preserved EF of 60-65% with no regional wall motion abnormalities and mild dilation of the left atrium. He was restarted on Lopressor 12.5 mg twice daily for rate control and Eliquis 5 mg twice daily was initiated for anticoagulation.  In talking with the patient today, he reports significant improvement in his symptoms since hospital discharge. He was initially discharged to the [facility] for short-term rehabilitation and was just discharged two days ago. Says he underwent PT on a daily basis and feels like his weakness improved. Says his HR was checked regularly and remained in the 60's to 90's. Unsure about his BP but says it was well-controlled. He received Eliquis while at the [facility] but did not pick up his Rx from the Pharmacy due to being unsure if he needed to continue the medication. Has also only been taking Lopressor once daily.   He denies any recent dyspnea on exertion, orthopnea, PND, chest pain, or palpitations. Has  chronic lower extremity edema which is unchanged by his report. Weight was 277 lbs at the time of hospital discharge, down to 271 lbs today.    Past Medical History:  Diagnosis Date  . Asthma   . Diabetes mellitus without complication ([place])   . DLBCL (diffuse large B cell lymphoma) ([place]) 02/28/2009  . Edema   . Heart failure, diastolic, chronic ([place])    patient denies  . Hyperlipidemia, mixed   . Hypertension   . IDA (iron deficiency anemia)   . Large cell lymphoma ([place]) 01/2005   autologous stem cell transplant 12/2005  . Obesity, morbid (more than 100 lbs over ideal weight or BMI > 40) ([facility])   . Venous insufficiency 04/29/2011    Past Surgical History:  Procedure Laterality Date  . BACK SURGERY  2006   T6 vertebrae removed/titanium placed  . COLONOSCOPY  11/24/2004   Polyps in the left colon ablated/removed as described above.  Two submucosal lesions consistent with lipomas as described above not  manipulated./ Normal rectum  . COLONOSCOPY  06/20/2012   MULTIPLE RECTAL AND COLONIC POLYPS  . COLONOSCOPY N/A 03/08/2015   RMR: Capacious, redundant colon. Multiple colonic and rectosigmoid polyps removed. ablated as described above. colonic lipoma abnormal appearing terminal ileum likely a variant of normal). Howeverwith history  biopsies obtained.   . ESOPHAGOGASTRODUODENOSCOPY N/A 03/08/2015   RMR: Hiatal hernia Polypoid gastric mucosa with multiple gastric polyps. largest polyp removed via snare polypectomgy hemostasis clip placed at base. Status post gastric biopsy. Status post video capsule placement.   Marland Kitchen  GIVENS CAPSULE STUDY N/A 03/08/2015   Procedure: GIVENS CAPSULE STUDY;  Surgeon: Daneil Dolin, MD;  Location: [facility];  Service: Endoscopy;  Laterality: N/A;  . LIMBAL STEM CELL TRANSPLANT    . LUNG BIOPSY  6/06  . MULTIPLE TOOTH EXTRACTIONS  04/2005  . port a cath placement    . PORT-A-CATH REMOVAL  09/08/2012   Procedure: REMOVAL PORT-A-CATH;  Surgeon: Melrose Nakayama,  MD;  Location: [facility];  Service: Thoracic;  Laterality: N/A;    Current Medications: Outpatient Medications Prior to Visit  Medication Sig Dispense Refill  . glimepiride (AMARYL) 1 MG tablet take 1/2 tablet by mouth once daily WITH BREAKFAST 15 tablet 0  . ipratropium-albuterol (DUONEB) 0.5-2.5 (3) MG/3ML SOLN Take 3 mLs by nebulization every 8 (eight) hours. 360 mL   . metoprolol tartrate (LOPRESSOR) 25 MG tablet Take 1 tablet (25 mg total) by mouth 2 (two) times daily. 60 tablet 0  . apixaban (ELIQUIS) 5 MG TABS tablet Take 1 tablet (5 mg total) by mouth 2 (two) times daily. (Patient not taking: Reported on 09/23/2017) 60 tablet 0   No facility-administered medications prior to visit.      Allergies:   Penicillins   Social History   Socioeconomic History  . Marital status: Married    Spouse name: None  . Number of children: 2  . Years of education: None  . Highest education level: None  Social Needs  . Financial resource strain: None  . Food insecurity - worry: None  . Food insecurity - inability: None  . Transportation needs - medical: None  . Transportation needs - non-medical: None  Occupational History  . Occupation: disability due to back    Employer: UNEMPLOYED  Tobacco Use  . Smoking status: Former Smoker    Packs/day: 0.50    Years: 15.00    Pack years: 7.50    Last attempt to quit: 11/11/2002    Years since quitting: 14.8  . Smokeless tobacco: Never Used  Substance and Sexual Activity  . Alcohol use: No    Alcohol/week: 0.0 oz  . Drug use: No  . Sexual activity: None  Other Topics Concern  . None  Social History Narrative   Married   No regular exercise     Family History:  The patient's family history includes Cancer in his father and mother.   Review of Systems:   Please see the history of present illness.     General:  No chills, fever, night sweats or weight changes.  Cardiovascular:  No chest pain, dyspnea on exertion, orthopnea, palpitations,  paroxysmal nocturnal dyspnea. Positive for lower extremity edema.  Dermatological: No rash, lesions/masses Respiratory: No cough, dyspnea Urologic: No hematuria, dysuria Abdominal:   No nausea, vomiting, diarrhea, bright red blood per rectum, melena, or hematemesis Neurologic:  No visual changes, changes in mental status. Positive for weakness.   All other systems reviewed and are otherwise negative except as noted above.   Physical Exam:    VS:  BP (!) 152/98   Pulse 93   Ht 5\' 11"  (1.803 m)   Wt 271 lb (122.9 kg)   SpO2 95%   BMI 37.80 kg/m    General: Well developed, well nourished [place] American male appearing in no acute distress. Head: Normocephalic, atraumatic, sclera non-icteric, no xanthomas, nares are without discharge.  Neck: No carotid bruits. JVD not elevated.  Lungs: Respirations regular and unlabored, without wheezes or rales.  Heart: Irregularly irregular. No S3 or S4.  No murmur, no rubs, or gallops appreciated. Abdomen: Soft, non-tender, non-distended with normoactive bowel sounds. No hepatomegaly. No rebound/guarding. No obvious abdominal masses. Msk:  Strength and tone appear normal for age. No joint deformities or effusions. Extremities: No clubbing or cyanosis. Chronic edema with xerosis.  Distal pedal pulses are 2+ bilaterally. Neuro: Alert and oriented X 3. Moves all extremities spontaneously. No focal deficits noted. Psych:  Responds to questions appropriately with a normal affect. Skin: No rashes or lesions noted  Wt Readings from Last 3 Encounters:  09/23/17 271 lb (122.9 kg)  09/06/17 277 lb 11.2 oz (126 kg)  05/20/17 276 lb 12.8 oz (125.6 kg)     Studies/Labs Reviewed:   EKG:  EKG is not ordered today.    Recent Labs: 05/18/2017: B Natriuretic Peptide 73.0 09/08/2017: Magnesium 2.0; TSH 1.122 09/20/2017: ALT 31; BUN 16; Creatinine, Ser 0.91; Hemoglobin 13.6; Platelets 181; Potassium 4.3; Sodium 139   Lipid Panel No results found for: CHOL,  TRIG, HDL, CHOLHDL, VLDL, LDLCALC, LDLDIRECT  Additional studies/ records that were reviewed today include:   Echocardiogram: 09/07/2017 Study Conclusions  - Procedure narrative: Transthoracic echocardiography. Image   quality was adequate. Intravenous contrast (Definity) was   administered. - Left ventricle: The cavity size was normal. Wall thickness was   increased in a pattern of mild LVH. Systolic function was normal.   The estimated ejection fraction was in the range of 60% to 65%.   Wall motion was normal; there were no regional wall motion   abnormalities. The study is not technically sufficient to allow   evaluation of LV diastolic function due to atrial flutter. - Left atrium: The atrium was mildly dilated. - Systemic veins: IVC is dilated with normal respiratory variation.   Estimated CVP 8 mmHg.  Assessment:    1. Atrial flutter with controlled response ([place])   2. Current use of long term anticoagulation   3. DIASTOLIC HEART FAILURE, CHRONIC   4. Essential hypertension   5. Type 2 diabetes mellitus with complication, without long-term current use of insulin ([place])      Plan:   In order of problems listed above:  1. Atrial Flutter/ Use of Long-Term Anticoagulation - the patient was recently admitted for PNA and found to have new onset atrial flutter. TSH and electrolytes were WNL. Echo showed a preserved EF of 60-65% with no regional wall motion abnormalities and mild dilation of the left atrium. - he was started on Eliquis for anticoagulation but did not pick up the Rx following discharge from the [facility]. He does not wish to remain on medications long-term but we extensively reviewed the indications for anticoagulation in the setting of his arrhythmia along with risks and benefits and he is in agreement to being on the medication at this time. Will therefore resume Eliquis 5mg  BID in the setting of his CHA2DS2-VASc Score of 3 (HTN, DM, Age).  - HR is in the 80's to  90's during today's visit. He has only been taking Lopressor once daily. Will titrate to 25mg  BID for improved HR and BP control.   2. Chronic Diastolic CHF - recent echo showed a preserved EF of 60-65% with no regional WMA. He has chronic edema but denies any recent dyspnea on exertion, orthopnea, or PND. Weight has declined by 6 lbs since hospital discharge.  - he declines to be on a diuretic at this time. Continued fluid restriction and sodium restriction advised along with elevating his extremities.   3. HTN - BP  is elevated at 152/98 during today's visit. Reports this was well-controlled when checked at the [facility] but since returning home he has only been taking Lopressor once daily. Will restart at 25mg  BID. I have asked him to keep a log of blood pressure readings at home and to call back if numbers remain elevated.   4. Type 2 DM - Hgb A1c well-controlled at 5.3 when checked earlier this month.  - remains on Glimepiride.    Medication Adjustments/Labs and Tests Ordered: Current medicines are reviewed at length with the patient today.  Concerns regarding medicines are outlined above.  Medication changes, Labs and Tests ordered today are listed in the Patient Instructions below. Patient Instructions  Medication Instructions:  Your physician recommends that you continue on your current medications as directed. Please refer to the Current Medication list given to you today.  Labwork: NONE   Testing/Procedures: NONE   Follow-Up: Your physician recommends that you schedule a follow-up appointment in: 2 Months with Dr. Bronson Ing   Any Other Special Instructions Will Be Listed Below (If Applicable).  If you need a refill on your cardiac medications before your next appointment, please call your pharmacy. Thank you for choosing [facility]!     Signed, Erma Heritage, PA-C  09/23/2017 4:46 PM    [facility] [address]  [place], [place] [address] Phone: [phone]

## 2017-09-23 ENCOUNTER — Encounter: Payer: Self-pay | Admitting: Student

## 2017-09-23 ENCOUNTER — Ambulatory Visit (INDEPENDENT_AMBULATORY_CARE_PROVIDER_SITE_OTHER): Payer: Medicare Other | Admitting: Student

## 2017-09-23 VITALS — BP 152/98 | HR 93 | Ht 71.0 in | Wt 271.0 lb

## 2017-09-23 DIAGNOSIS — I4892 Unspecified atrial flutter: Secondary | ICD-10-CM | POA: Diagnosis not present

## 2017-09-23 DIAGNOSIS — I10 Essential (primary) hypertension: Secondary | ICD-10-CM

## 2017-09-23 DIAGNOSIS — Z7901 Long term (current) use of anticoagulants: Secondary | ICD-10-CM

## 2017-09-23 DIAGNOSIS — I5032 Chronic diastolic (congestive) heart failure: Secondary | ICD-10-CM | POA: Diagnosis not present

## 2017-09-23 DIAGNOSIS — E118 Type 2 diabetes mellitus with unspecified complications: Secondary | ICD-10-CM

## 2017-09-23 MED ORDER — METOPROLOL TARTRATE 25 MG PO TABS: 25.0000 mg | ORAL_TABLET | Freq: Two times a day (BID) | ORAL | 3 refills | Status: DC

## 2017-09-23 MED ORDER — APIXABAN 5 MG PO TABS: 5.0000 mg | ORAL_TABLET | Freq: Two times a day (BID) | ORAL | 0 refills | Status: DC

## 2017-09-23 MED ORDER — APIXABAN 5 MG PO TABS: 5.0000 mg | ORAL_TABLET | Freq: Two times a day (BID) | ORAL | 11 refills | Status: DC

## 2017-09-23 NOTE — Patient Instructions (Signed)
Medication Instructions:  Your physician recommends that you continue on your current medications as directed. Please refer to the Current Medication list given to you today.   Labwork: NONE   Testing/Procedures: NONE   Follow-Up: Your physician recommends that you schedule a follow-up appointment in: 2 Months with Dr. Bronson Ing    Any Other Special Instructions Will Be Listed Below (If Applicable).     If you need a refill on your cardiac medications before your next appointment, please call your pharmacy. Thank you for choosing [facility]!

## 2017-09-27 DIAGNOSIS — R946 Abnormal results of thyroid function studies: Secondary | ICD-10-CM | POA: Diagnosis not present

## 2017-09-27 DIAGNOSIS — I509 Heart failure, unspecified: Secondary | ICD-10-CM | POA: Diagnosis not present

## 2017-09-27 DIAGNOSIS — J189 Pneumonia, unspecified organism: Secondary | ICD-10-CM | POA: Diagnosis not present

## 2017-09-27 DIAGNOSIS — Z1389 Encounter for screening for other disorder: Secondary | ICD-10-CM | POA: Diagnosis not present

## 2017-09-27 DIAGNOSIS — I4892 Unspecified atrial flutter: Secondary | ICD-10-CM | POA: Diagnosis not present

## 2017-09-27 DIAGNOSIS — Z6838 Body mass index (BMI) 38.0-38.9, adult: Secondary | ICD-10-CM | POA: Diagnosis not present

## 2017-09-27 DIAGNOSIS — Z125 Encounter for screening for malignant neoplasm of prostate: Secondary | ICD-10-CM | POA: Diagnosis not present

## 2017-10-05 DIAGNOSIS — Z7984 Long term (current) use of oral hypoglycemic drugs: Secondary | ICD-10-CM | POA: Diagnosis not present

## 2017-10-05 DIAGNOSIS — Z9481 Bone marrow transplant status: Secondary | ICD-10-CM | POA: Diagnosis not present

## 2017-10-05 DIAGNOSIS — J9600 Acute respiratory failure, unspecified whether with hypoxia or hypercapnia: Secondary | ICD-10-CM | POA: Diagnosis not present

## 2017-10-05 DIAGNOSIS — I110 Hypertensive heart disease with heart failure: Secondary | ICD-10-CM | POA: Diagnosis not present

## 2017-10-05 DIAGNOSIS — I4892 Unspecified atrial flutter: Secondary | ICD-10-CM | POA: Diagnosis not present

## 2017-10-05 DIAGNOSIS — E119 Type 2 diabetes mellitus without complications: Secondary | ICD-10-CM | POA: Diagnosis not present

## 2017-10-05 DIAGNOSIS — Z8572 Personal history of non-Hodgkin lymphomas: Secondary | ICD-10-CM | POA: Diagnosis not present

## 2017-10-05 DIAGNOSIS — J189 Pneumonia, unspecified organism: Secondary | ICD-10-CM | POA: Diagnosis not present

## 2017-10-05 DIAGNOSIS — E782 Mixed hyperlipidemia: Secondary | ICD-10-CM | POA: Diagnosis not present

## 2017-10-05 DIAGNOSIS — I5032 Chronic diastolic (congestive) heart failure: Secondary | ICD-10-CM | POA: Diagnosis not present

## 2017-10-05 DIAGNOSIS — Z87891 Personal history of nicotine dependence: Secondary | ICD-10-CM | POA: Diagnosis not present

## 2017-10-05 DIAGNOSIS — Z6837 Body mass index (BMI) 37.0-37.9, adult: Secondary | ICD-10-CM | POA: Diagnosis not present

## 2017-10-05 DIAGNOSIS — J45909 Unspecified asthma, uncomplicated: Secondary | ICD-10-CM | POA: Diagnosis not present

## 2017-10-08 DIAGNOSIS — I110 Hypertensive heart disease with heart failure: Secondary | ICD-10-CM | POA: Diagnosis not present

## 2017-10-08 DIAGNOSIS — J9600 Acute respiratory failure, unspecified whether with hypoxia or hypercapnia: Secondary | ICD-10-CM | POA: Diagnosis not present

## 2017-10-08 DIAGNOSIS — E119 Type 2 diabetes mellitus without complications: Secondary | ICD-10-CM | POA: Diagnosis not present

## 2017-10-08 DIAGNOSIS — J189 Pneumonia, unspecified organism: Secondary | ICD-10-CM | POA: Diagnosis not present

## 2017-10-08 DIAGNOSIS — I5032 Chronic diastolic (congestive) heart failure: Secondary | ICD-10-CM | POA: Diagnosis not present

## 2017-10-08 DIAGNOSIS — I4892 Unspecified atrial flutter: Secondary | ICD-10-CM | POA: Diagnosis not present

## 2017-10-13 DIAGNOSIS — I110 Hypertensive heart disease with heart failure: Secondary | ICD-10-CM | POA: Diagnosis not present

## 2017-10-13 DIAGNOSIS — E119 Type 2 diabetes mellitus without complications: Secondary | ICD-10-CM | POA: Diagnosis not present

## 2017-10-13 DIAGNOSIS — J189 Pneumonia, unspecified organism: Secondary | ICD-10-CM | POA: Diagnosis not present

## 2017-10-13 DIAGNOSIS — J9600 Acute respiratory failure, unspecified whether with hypoxia or hypercapnia: Secondary | ICD-10-CM | POA: Diagnosis not present

## 2017-10-13 DIAGNOSIS — I5032 Chronic diastolic (congestive) heart failure: Secondary | ICD-10-CM | POA: Diagnosis not present

## 2017-10-13 DIAGNOSIS — I4892 Unspecified atrial flutter: Secondary | ICD-10-CM | POA: Diagnosis not present

## 2017-10-15 DIAGNOSIS — I110 Hypertensive heart disease with heart failure: Secondary | ICD-10-CM | POA: Diagnosis not present

## 2017-10-15 DIAGNOSIS — J189 Pneumonia, unspecified organism: Secondary | ICD-10-CM | POA: Diagnosis not present

## 2017-10-15 DIAGNOSIS — I4892 Unspecified atrial flutter: Secondary | ICD-10-CM | POA: Diagnosis not present

## 2017-10-15 DIAGNOSIS — I5032 Chronic diastolic (congestive) heart failure: Secondary | ICD-10-CM | POA: Diagnosis not present

## 2017-10-15 DIAGNOSIS — E119 Type 2 diabetes mellitus without complications: Secondary | ICD-10-CM | POA: Diagnosis not present

## 2017-10-15 DIAGNOSIS — J9600 Acute respiratory failure, unspecified whether with hypoxia or hypercapnia: Secondary | ICD-10-CM | POA: Diagnosis not present

## 2017-11-24 ENCOUNTER — Inpatient Hospital Stay (HOSPITAL_COMMUNITY): Payer: Medicare Other | Admitting: Adult Health

## 2017-11-24 ENCOUNTER — Other Ambulatory Visit (HOSPITAL_COMMUNITY): Payer: Self-pay | Admitting: *Deleted

## 2017-11-24 ENCOUNTER — Inpatient Hospital Stay (HOSPITAL_COMMUNITY): Payer: Medicare Other | Attending: Internal Medicine

## 2017-11-24 DIAGNOSIS — C8330 Diffuse large B-cell lymphoma, unspecified site: Secondary | ICD-10-CM

## 2017-11-24 DIAGNOSIS — Z8572 Personal history of non-Hodgkin lymphomas: Secondary | ICD-10-CM | POA: Diagnosis not present

## 2017-11-24 DIAGNOSIS — D509 Iron deficiency anemia, unspecified: Secondary | ICD-10-CM | POA: Diagnosis not present

## 2017-11-24 DIAGNOSIS — D696 Thrombocytopenia, unspecified: Secondary | ICD-10-CM | POA: Insufficient documentation

## 2017-11-24 LAB — CBC WITH DIFFERENTIAL/PLATELET
BASOS ABS: 0 10*3/uL (ref 0.0–0.1)
Basophils Relative: 1 %
EOS ABS: 0.2 10*3/uL (ref 0.0–0.7)
EOS PCT: 6 %
HCT: 41.3 % (ref 39.0–52.0)
HEMOGLOBIN: 13.9 g/dL (ref 13.0–17.0)
LYMPHS PCT: 26 %
Lymphs Abs: 1 10*3/uL (ref 0.7–4.0)
MCH: 29.6 pg (ref 26.0–34.0)
MCHC: 33.7 g/dL (ref 30.0–36.0)
MCV: 88.1 fL (ref 78.0–100.0)
Monocytes Absolute: 0.6 10*3/uL (ref 0.1–1.0)
Monocytes Relative: 16 %
Neutro Abs: 2 10*3/uL (ref 1.7–7.7)
Neutrophils Relative %: 51 %
PLATELETS: 70 10*3/uL — AB (ref 150–400)
RBC: 4.69 MIL/uL (ref 4.22–5.81)
RDW: 14.7 % (ref 11.5–15.5)
WBC: 4 10*3/uL (ref 4.0–10.5)

## 2017-11-24 LAB — FERRITIN: Ferritin: 90 ng/mL (ref 24–336)

## 2017-11-24 LAB — COMPREHENSIVE METABOLIC PANEL
ALT: 18 U/L (ref 17–63)
AST: 22 U/L (ref 15–41)
Albumin: 3.8 g/dL (ref 3.5–5.0)
Alkaline Phosphatase: 71 U/L (ref 38–126)
Anion gap: 10 (ref 5–15)
BUN: 14 mg/dL (ref 6–20)
CHLORIDE: 107 mmol/L (ref 101–111)
CO2: 23 mmol/L (ref 22–32)
CREATININE: 1.12 mg/dL (ref 0.61–1.24)
Calcium: 9.3 mg/dL (ref 8.9–10.3)
GFR calc Af Amer: >60 mL/min (ref >60)
GFR calc non Af Amer: >60 mL/min (ref >60)
GLUCOSE: 88 mg/dL (ref 65–99)
Potassium: 4.2 mmol/L (ref 3.5–5.1)
Sodium: 140 mmol/L (ref 135–145)
Total Bilirubin: 0.6 mg/dL (ref 0.3–1.2)
Total Protein: 7.2 g/dL (ref 6.5–8.1)

## 2017-11-24 LAB — LACTATE DEHYDROGENASE: LDH: 182 U/L (ref 98–192)

## 2017-11-24 LAB — IRON AND TIBC
Iron: 50 ug/dL (ref 45–182)
Saturation Ratios: 14 % — ABNORMAL LOW (ref 17.9–39.5)
TIBC: 363 ug/dL (ref 250–450)
UIBC: 313 ug/dL

## 2017-11-24 NOTE — Progress Notes (Signed)
[name], [name] [address]   CLINIC:  Medical Oncology/Hematology  PCP:  [facility], [place] [address] [phone]   REASON FOR VISIT:  Follow-up for DLBCL s/p stem cell transplant (2007) AND Iron deficiency anemia   CURRENT THERAPY: Observation AND IV iron prn    BRIEF ONCOLOGIC HISTORY:  Oncology History   S/P chemotherapy following stabilization of the spine.  Initial biopsy was on 07/13/2005 on his bone marrow.  Thoracic vertebra was biopsied on 02/04/2005 and mediastinum on 02/03/2005.  S/P R-CHOP and achieved an incomplete response, then underwent ICE chemotherapy pre-transplant and total body radiation with autologous stem cell transplant on 01/15/2006 and maintained on Rituxan for 2 years.  Thus far without recurrence.     DLBCL (diffuse large B cell lymphoma) ([facility])   01/27/2005 Imaging    PET- diffuse activity  involving the L cervical area, extensive activity in the mediastinumn particularly in the R paratracheal area and to a lesser degree in the anterior mediastinum.  Lg area in porta caval nodes in upper abd and diffuse bone activity      02/03/2005 Pathology Results    Mediastinal lymph node demonstrating diffuse large cell lymphoma, B-type.  Vertebral bone biopsy demonstrating diffuse large b-cell lymphoma      03/11/2005 - 08/04/2005 Chemotherapy    R-CHOP x 8 cycles      05/04/2005 Imaging    PET- near resolution of previously identified uptake.      07/13/2005 Bone Marrow Biopsy    Bone marrow aspiration and biopsy is negative for lymphoma      08/31/2005 Imaging    PET- further response to therapy with findings consistent with residual disease.  Hypermetabolic activity identified within the anterior mediastinal lymph node (smaller but still pathologic).  Borderline hypermetabolic small right pleural effusion      10/20/2005 - 11/13/2005 Chemotherapy    R-[facility] x 2 [facility] at [facility]      01/08/2006 - 01/09/2006 Chemotherapy    Cyclophosphamide 600 mg daily x 2      01/10/2006 - 01/14/2006 Radiation Therapy    Total body radiation twice daily      01/15/2006 Bone Marrow Transplant    Stem cell transplant at [facility]      04/05/2006 - 12/05/2007 Chemotherapy    Rituxan weekly x 4 every 6 months x 2 years        INTERVAL HISTORY:  [name] 67 y.o. male returns for routine follow-up for [facility] s/p stem cell transplant in 2007, as well as iron deficiency anemia.   He is here today with his wife. Overall, he tells me that he has been feeling great. Both his appetite and energy levels are 100%.  He has been working on losing weight by being more active and trying to eat a more healthy diet.  Denies fevers, night sweats, new lumps, or unexplained weight loss.   He has occasional fatigue; most recent episode a few weeks ago where he felt very weak "like my blood counts were low."  He was called with his most recent labs a few weeks ago and does not need IV iron infusion at that time. Denies any blood in his stool or dark/tarry stools.  Denies hematuria, nosebleeds, or gingival bleeding. He has a chronic cough, which he attributes to his sinuses/allergies.  The peripheral neuropathy to his feet that he has had for many years is improving.    Overall, he feels "pretty  good."      REVIEW OF SYSTEMS:  Review of Systems  Constitutional: Positive for fatigue (occasional fatigue). Negative for chills and fever.  HENT:  Negative.  Negative for lump/mass and nosebleeds.   Eyes: Negative.   Respiratory: Positive for cough. Negative for shortness of breath.   Cardiovascular: Positive for leg swelling. Negative for chest pain.  Gastrointestinal: Negative.  Negative for abdominal pain, blood in stool, constipation, diarrhea, nausea and vomiting.  Endocrine: Negative.   Genitourinary: Negative.  Negative for dysuria and hematuria.   Musculoskeletal: Negative.  Negative for arthralgias.    Skin: Negative.  Negative for rash.  Neurological: Positive for numbness. Negative for dizziness and headaches.  Hematological: Negative.  Negative for adenopathy. Does not bruise/bleed easily.  Psychiatric/Behavioral: Negative.  Negative for depression and sleep disturbance. The patient is not nervous/anxious.      PAST MEDICAL/SURGICAL HISTORY:  Past Medical History:  Diagnosis Date  . Asthma   . Diabetes mellitus without complication ([facility])   . DLBCL (diffuse large B cell lymphoma) ([place]) 02/28/2009  . Edema   . Heart failure, diastolic, chronic ([place])    patient denies  . Hyperlipidemia, mixed   . Hypertension   . IDA (iron deficiency anemia)   . Large cell lymphoma ([place]) 01/2005   autologous stem cell transplant 12/2005  . Obesity, morbid (more than 100 lbs over ideal weight or BMI > 40) ([facility])   . Venous insufficiency 04/29/2011   Past Surgical History:  Procedure Laterality Date  . BACK SURGERY  2006   T6 vertebrae removed/titanium placed  . COLONOSCOPY  11/24/2004   Polyps in the left colon ablated/removed as described above.  Two submucosal lesions consistent with lipomas as described above not  manipulated./ Normal rectum  . COLONOSCOPY  06/20/2012   MULTIPLE RECTAL AND COLONIC POLYPS  . COLONOSCOPY N/A 03/08/2015   RMR: Capacious, redundant colon. Multiple colonic and rectosigmoid polyps removed. ablated as described above. colonic lipoma abnormal appearing terminal ileum likely a variant of normal). Howeverwith history  biopsies obtained.   . ESOPHAGOGASTRODUODENOSCOPY N/A 03/08/2015   RMR: Hiatal hernia Polypoid gastric mucosa with multiple gastric polyps. largest polyp removed via snare polypectomgy hemostasis clip placed at base. Status post gastric biopsy. Status post video capsule placement.   Marland Kitchen GIVENS CAPSULE STUDY N/A 03/08/2015   Procedure: GIVENS CAPSULE STUDY;  Surgeon: Daneil Dolin, MD;  Location[facility];  Service: Endoscopy;  Laterality: N/A;  . LIMBAL STEM  CELL TRANSPLANT    . LUNG BIOPSY  6/06  . MULTIPLE TOOTH EXTRACTIONS  04/2005  . port a cath placement    . PORT-A-CATH REMOVAL  09/08/2012   Procedure: REMOVAL PORT-A-CATH;  Surgeon: Melrose Nakayama, MD;  Location: [facility];  Service: Thoracic;  Laterality: N/A;     SOCIAL HISTORY:  Social History   Socioeconomic History  . Marital status: Married    Spouse name: Not on file  . Number of children: 2  . Years of education: Not on file  . Highest education level: Not on file  Occupational History  . Occupation: disability due to back    Employer: UNEMPLOYED  Social Needs  . Financial resource strain: Not on file  . Food insecurity:    Worry: Not on file    Inability: Not on file  . Transportation needs:    Medical: Not on file    Non-medical: Not on file  Tobacco Use  . Smoking status: Former Smoker    Packs/day: 0.50  Years: 15.00    Pack years: 7.50    Last attempt to quit: 11/11/2002    Years since quitting: 15.0  . Smokeless tobacco: Never Used  Substance and Sexual Activity  . Alcohol use: No    Alcohol/week: 0.0 oz  . Drug use: No  . Sexual activity: Not on file  Lifestyle  . Physical activity:    Days per week: Not on file    Minutes per session: Not on file  . Stress: Not on file  Relationships  . Social connections:    Talks on phone: Not on file    Gets together: Not on file    Attends religious service: Not on file    Active member of club or organization: Not on file    Attends meetings of clubs or organizations: Not on file    Relationship status: Not on file  . Intimate partner violence:    Fear of current or ex partner: Not on file    Emotionally abused: Not on file    Physically abused: Not on file    Forced sexual activity: Not on file  Other Topics Concern  . Not on file  Social History Narrative   Married   No regular exercise    FAMILY HISTORY:  Family History  Problem Relation Age of Onset  . Cancer Mother        lung  .  Cancer Father        prostate  . Colon cancer Neg Hx   . Diabetes Neg Hx     CURRENT MEDICATIONS:  Outpatient Encounter Medications as of 11/24/2017  Medication Sig  . apixaban (ELIQUIS) 5 MG TABS tablet Take 1 tablet (5 mg total) by mouth 2 (two) times daily.  Marland Kitchen glimepiride (AMARYL) 1 MG tablet take 1/2 tablet by mouth once daily WITH BREAKFAST  . ipratropium-albuterol (DUONEB) 0.5-2.5 (3) MG/3ML SOLN Take 3 mLs by nebulization every 8 (eight) hours.  . metoprolol tartrate (LOPRESSOR) 25 MG tablet Take 1 tablet (25 mg total) by mouth 2 (two) times daily.   No facility-administered encounter medications on file as of 11/24/2017.     ALLERGIES:  Allergies  Allergen Reactions  . Penicillins Rash    Has patient had a PCN reaction causing immediate rash, facial/tongue/throat swelling, SOB or lightheadedness with hypotension: Unknown Has patient had a PCN reaction causing severe rash involving mucus membranes or skin necrosis: Unknown Has patient had a PCN reaction that required hospitalization: Unknown Has patient had a PCN reaction occurring within the last 10 years: No If all of the above answers are "NO", then may proceed with Cephalosporin use.      PHYSICAL EXAM:  ECOG Performance status: 0-1 - Mild symptoms; remains independent   BP: 125/58 HR: 88 Resp: 16 O2 sat: 99%  There were no vitals filed for this visit.  Physical Exam  Constitutional: He is oriented to person, place, and time and well-developed, well-nourished, and in no distress.  HENT:  Head: Normocephalic.  Mild posterior pharyngeal erythema.   Eyes: Pupils are equal, round, and reactive to light. Conjunctivae are normal. No scleral icterus.  Neck: Normal range of motion. Neck supple.  Cardiovascular: Normal rate, regular rhythm and normal heart sounds.  Pulmonary/Chest: Effort normal and breath sounds normal. No respiratory distress.  Abdominal: Soft. Bowel sounds are normal. There is no tenderness. There is  no rebound and no guarding.  Musculoskeletal: Normal range of motion. He exhibits edema (1-2+ BLE edema (chronic)).  Lymphadenopathy:  He has no cervical adenopathy.    He has no axillary adenopathy.       Right: No supraclavicular adenopathy present.       Left: No supraclavicular adenopathy present.  Neurological: He is alert and oriented to person, place, and time. No cranial nerve deficit. Gait normal.  Skin: Skin is warm and dry. No rash noted.  Very dry skin, particularly to legs.   Psychiatric: Mood, memory, affect and judgment normal.  Nursing note and vitals reviewed.    LABORATORY DATA:  I have reviewed the labs as listed.  CBC    Component Value Date/Time   WBC 3.4 (L) 09/20/2017 0330   RBC 4.62 09/20/2017 0330   HGB 13.6 09/20/2017 0330   HCT 43.3 09/20/2017 0330   PLT 181 09/20/2017 0330   MCV 93.7 09/20/2017 0330   MCH 29.4 09/20/2017 0330   MCHC 31.4 09/20/2017 0330   RDW 14.9 09/20/2017 0330   LYMPHSABS 0.9 09/20/2017 0330   MONOABS 0.5 09/20/2017 0330   EOSABS 0.3 09/20/2017 0330   BASOSABS 0.0 09/20/2017 0330   CMP Latest Ref Rng & Units 09/20/2017 09/08/2017 09/07/2017  Glucose 65 - 99 mg/dL 114(H) 87 96  BUN 6 - 20 mg/dL 16 9 9   Creatinine 0.61 - 1.24 mg/dL 0.91 0.91 0.90  Sodium 135 - 145 mmol/L 139 138 136  Potassium 3.5 - 5.1 mmol/L 4.3 4.1 3.6  Chloride 101 - 111 mmol/L 106 105 106  CO2 22 - 32 mmol/L 25 22 20(L)  Calcium 8.9 - 10.3 mg/dL 8.7(L) 9.1 8.3(L)  Total Protein 6.5 - 8.1 g/dL 6.2(L) - 6.1(L)  Total Bilirubin 0.3 - 1.2 mg/dL 0.5 - 0.7  Alkaline Phos 38 - 126 U/L 61 - 57  AST 15 - 41 U/L 24 - 17  ALT 17 - 63 U/L 31 - 15(L)    Ref. Range 01/22/2017 14:20  Iron Latest Ref Range: 45 - 182 ug/dL 31 (L)  UIBC Latest Units: ug/dL 280  TIBC Latest Ref Range: 250 - 450 ug/dL 311  Saturation Ratios Latest Ref Range: 17.9 - 39.5 % 10 (L)  Ferritin Latest Ref Range: 24 - 336 ng/mL 147   PENDING LABS:    DIAGNOSTIC IMAGING:    PATHOLOGY:       ASSESSMENT & PLAN:   DLBLC s/p stem cell transplant:  -Diagnosed in 06/2005. Completed R-CHOP chemotherapy with incomplete response. Went on to have ICE chemotherapy pre-transplant and total body radiation with autologous stem cell transplant on 01/15/06. Completed 2 years of maintenance Rituxan post-transplant.  He has remained without recurrent disease since that time.  -Clinically remains NED. He was released from stem cell transplant clinic several years ago.   -We will continue to monitor.   Iron deficiency anemia:  -Thought to be secondary to chronic GI blood loss; h/o colonoscopy and EGD in 02/2015 revealed probable AVMs.  -Last dose of IV iron in 02/2016.  Oncology Flowsheet 03/16/2016  ferumoxytol [facility]) IV 510 mg  -Last iron studies adequate for now.  Ferritin >100.  Will recheck labs in about 3 months, as he may possibly need IV iron soon (with lower iron sats at 10 on 01/22/17). Return to cancer center in 6 months with labs.  -Discussed option of trial of oral prescription iron supplementation.  He is reluctant to try oral iron given possible side effects, which is certainly reasonable.  -Discussed that if his iron levels remain stable, then we can increase time between follow-up visits to annually (from every  6 months).   Cough:  -Lungs clear to auscultation today. Cough likely secondary to post-nasal drip.  -Recommended he try OTC allergy medication, like Claritin or Zyrtec, to see if his symptoms improve. Encouraged him to follow-up with PCP if symptoms remain.   Health maintenance/Wellness promotion:  -Recommended he try the [facility] program, which is a free fitness program designed for cancer survivors to work on creating a healthy lifestyle.  -Encouraged him to continue follow-up with PCP as indicated for routine physical exams, age/gender appropriate cancer screenings, and vaccinations.  -Commended his efforts for healthy diet and exercise.     Dispo:   -Labs only in 3 months.  -Return to cancer center in 6 months with labs.    All questions were answered to patient's stated satisfaction. Encouraged patient to call with any new concerns or questions before his next visit to the cancer center and we can certain see him sooner, if needed.      Orders placed this encounter:  No orders of the defined types were placed in this encounter.     Mike Craze, NP [place] [phone]   This encounter was created in error - please disregard.

## 2017-11-25 ENCOUNTER — Encounter (HOSPITAL_COMMUNITY): Payer: Self-pay | Admitting: Adult Health

## 2017-11-25 ENCOUNTER — Other Ambulatory Visit: Payer: Self-pay

## 2017-11-25 ENCOUNTER — Inpatient Hospital Stay (HOSPITAL_BASED_OUTPATIENT_CLINIC_OR_DEPARTMENT_OTHER): Payer: Medicare Other | Admitting: Adult Health

## 2017-11-25 VITALS — BP 118/102 | HR 85 | Temp 98.4°F | Resp 18 | Ht 71.5 in | Wt 274.5 lb

## 2017-11-25 DIAGNOSIS — Z8572 Personal history of non-Hodgkin lymphomas: Secondary | ICD-10-CM

## 2017-11-25 DIAGNOSIS — D696 Thrombocytopenia, unspecified: Secondary | ICD-10-CM | POA: Diagnosis not present

## 2017-11-25 DIAGNOSIS — D509 Iron deficiency anemia, unspecified: Secondary | ICD-10-CM

## 2017-11-25 DIAGNOSIS — C8330 Diffuse large B-cell lymphoma, unspecified site: Secondary | ICD-10-CM

## 2017-11-25 NOTE — Progress Notes (Signed)
[place], [address] [address]   CLINIC:  Medical Oncology/Hematology  PCP:  [facility], [place] [address] [phone]   REASON FOR VISIT:  Follow-up for DLBCL s/p stem cell transplant (2007) AND Iron deficiency anemia   CURRENT THERAPY: Observation AND IV iron prn    BRIEF ONCOLOGIC HISTORY:  Oncology History   S/P chemotherapy following stabilization of the spine.  Initial biopsy was on 07/13/2005 on his bone marrow.  Thoracic vertebra was biopsied on 02/04/2005 and mediastinum on 02/03/2005.  S/P R-CHOP and achieved an incomplete response, then underwent ICE chemotherapy pre-transplant and total body radiation with autologous stem cell transplant on 01/15/2006 and maintained on Rituxan for 2 years.  Thus far without recurrence.     DLBCL (diffuse large B cell lymphoma) ([facility])   01/27/2005 Imaging    PET- diffuse activity  involving the L cervical area, extensive activity in the mediastinumn particularly in the R paratracheal area and to a lesser degree in the anterior mediastinum.  Lg area in porta caval nodes in upper abd and diffuse bone activity      02/03/2005 Pathology Results    Mediastinal lymph node demonstrating diffuse large cell lymphoma, B-type.  Vertebral bone biopsy demonstrating diffuse large b-cell lymphoma      03/11/2005 - 08/04/2005 Chemotherapy    R-CHOP x 8 cycles      05/04/2005 Imaging    PET- near resolution of previously identified uptake.      07/13/2005 Bone Marrow Biopsy    Bone marrow aspiration and biopsy is negative for lymphoma      08/31/2005 Imaging    PET- further response to therapy with findings consistent with residual disease.  Hypermetabolic activity identified within the anterior mediastinal lymph node (smaller but still pathologic).  Borderline hypermetabolic small right pleural effusion      10/20/2005 - 11/13/2005 Chemotherapy    R-ICE x 2 cycles at [facility]      01/08/2006 - 01/09/2006 Chemotherapy    Cyclophosphamide 600 mg daily x 2      01/10/2006 - 01/14/2006 Radiation Therapy    Total body radiation twice daily      01/15/2006 Bone Marrow Transplant    Stem cell transplant at [facility]      04/05/2006 - 12/05/2007 Chemotherapy    Rituxan weekly x 4 every 6 months x 2 years        INTERVAL HISTORY:  Mr. Ermis 67 y.o. male returns for routine follow-up for [facility] s/p stem cell transplant in 2007, as well as iron deficiency anemia.   Here today unaccompanied.   Overall, he tells me he has been feeling "pretty good."  Appetite 100%; energy levels 50%. Does report feeling a bit more tired recently; he attributes some of this to his sinuses/allergies. "My head just feels stopped up."  He has not tried any OTC medications for this.  He has chronic issues with bilateral leg swelling, skin itching/dryness, feeling weak, and peripheral neuropathy to his legs/feet.  His knees have been bothering him more lately; he is walking with a cane today d/t pain.   Denies any adenopathy, night sweats, feverschills, blood in stools, dark/tarry stools, hematuria, hemoptysis, nosebleeds, or gingival bleeding.    Remains on Eliquis for A-fib; managed by cardiology.   He has a "bump" on his right upper leg that he wants me to take a look at today. "My brother had blood clots in his legs, so I just want  to make sure this isn't a blood clot."      REVIEW OF SYSTEMS:  Review of Systems  per HPI. Otherwise, 12 point ROS completed and negative except as stated above.    PAST MEDICAL/SURGICAL HISTORY:  Past Medical History:  Diagnosis Date  . Asthma   . Diabetes mellitus without complication ([facility])   . DLBCL (diffuse large B cell lymphoma) ([facility]) 02/28/2009  . Edema   . Heart failure, diastolic, chronic ([facility])    patient denies  . Hyperlipidemia, mixed   . Hypertension   . IDA (iron deficiency anemia)   . Large cell lymphoma ([facility]) 01/2005   autologous stem cell  transplant 12/2005  . Obesity, morbid (more than 100 lbs over ideal weight or BMI > 40) ([facility])   . Venous insufficiency 04/29/2011   Past Surgical History:  Procedure Laterality Date  . BACK SURGERY  2006   T6 vertebrae removed/titanium placed  . COLONOSCOPY  11/24/2004   Polyps in the left colon ablated/removed as described above.  Two submucosal lesions consistent with lipomas as described above not  manipulated./ Normal rectum  . COLONOSCOPY  06/20/2012   MULTIPLE RECTAL AND COLONIC POLYPS  . COLONOSCOPY N/A 03/08/2015   RMR: Capacious, redundant colon. Multiple colonic and rectosigmoid polyps removed. ablated as described above. colonic lipoma abnormal appearing terminal ileum likely a variant of normal). Howeverwith history  biopsies obtained.   . ESOPHAGOGASTRODUODENOSCOPY N/A 03/08/2015   RMR: Hiatal hernia Polypoid gastric mucosa with multiple gastric polyps. largest polyp removed via snare polypectomgy hemostasis clip placed at base. Status post gastric biopsy. Status post video capsule placement.   Marland Kitchen GIVENS CAPSULE STUDY N/A 03/08/2015   Procedure: GIVENS CAPSULE STUDY;  Surgeon: Daneil Dolin, MD;  [facility];  Service: Endoscopy;  Laterality: N/A;  . LIMBAL STEM CELL TRANSPLANT    . LUNG BIOPSY  6/06  . MULTIPLE TOOTH EXTRACTIONS  04/2005  . port a cath placement    . PORT-A-CATH REMOVAL  09/08/2012   Procedure: REMOVAL PORT-A-CATH;  Surgeon: Melrose Nakayama, MD;  Location: [facility];  Service: Thoracic;  Laterality: N/A;     SOCIAL HISTORY:  Social History   Socioeconomic History  . Marital status: Married    Spouse name: Not on file  . Number of children: 2  . Years of education: Not on file  . Highest education level: Not on file  Occupational History  . Occupation: disability due to back    Employer: UNEMPLOYED  Social Needs  . Financial resource strain: Not on file  . Food insecurity:    Worry: Not on file    Inability: Not on file  . Transportation  needs:    Medical: Not on file    Non-medical: Not on file  Tobacco Use  . Smoking status: Former Smoker    Packs/day: 0.50    Years: 15.00    Pack years: 7.50    Last attempt to quit: 11/11/2002    Years since quitting: 15.0  . Smokeless tobacco: Never Used  Substance and Sexual Activity  . Alcohol use: No    Alcohol/week: 0.0 oz  . Drug use: No  . Sexual activity: Not on file  Lifestyle  . Physical activity:    Days per week: Not on file    Minutes per session: Not on file  . Stress: Not on file  Relationships  . Social connections:    Talks on phone: Not on file    Gets together: Not  on file    Attends religious service: Not on file    Active member of club or organization: Not on file    Attends meetings of clubs or organizations: Not on file    Relationship status: Not on file  . Intimate partner violence:    Fear of current or ex partner: Not on file    Emotionally abused: Not on file    Physically abused: Not on file    Forced sexual activity: Not on file  Other Topics Concern  . Not on file  Social History Narrative   Married   No regular exercise    FAMILY HISTORY:  Family History  Problem Relation Age of Onset  . Cancer Mother        lung  . Cancer Father        prostate  . Colon cancer Neg Hx   . Diabetes Neg Hx     CURRENT MEDICATIONS:  Outpatient Encounter Medications as of 11/25/2017  Medication Sig  . apixaban (ELIQUIS) 5 MG TABS tablet Take 1 tablet (5 mg total) by mouth 2 (two) times daily.  . furosemide (LASIX) 20 MG tablet take 1 tablet by mouth once daily if needed  . glimepiride (AMARYL) 1 MG tablet take 1/2 tablet by mouth once daily WITH BREAKFAST  . metoprolol tartrate (LOPRESSOR) 25 MG tablet Take 1 tablet (25 mg total) by mouth 2 (two) times daily.  Marland Kitchen ipratropium-albuterol (DUONEB) 0.5-2.5 (3) MG/3ML SOLN Take 3 mLs by nebulization every 8 (eight) hours. (Patient not taking: Reported on 11/25/2017)   No facility-administered encounter  medications on file as of 11/25/2017.     ALLERGIES:  Allergies  Allergen Reactions  . Penicillins Rash    Has patient had a PCN reaction causing immediate rash, facial/tongue/throat swelling, SOB or lightheadedness with hypotension: Unknown Has patient had a PCN reaction causing severe rash involving mucus membranes or skin necrosis: Unknown Has patient had a PCN reaction that required hospitalization: Unknown Has patient had a PCN reaction occurring within the last 10 years: No If all of the above answers are "NO", then may proceed with Cephalosporin use.      PHYSICAL EXAM:  ECOG Performance status: 0-1 - Mild symptoms; remains independent   Vitals:   11/25/17 1417  BP: (!) 118/102  Pulse: 85  Resp: 18  Temp: 98.4 F (36.9 C)  SpO2: 98%    Filed Weights   11/25/17 1417  Weight: 274 lb 8 oz (124.5 kg)    Physical Exam  Constitutional: He is oriented to person, place, and time and well-developed, well-nourished, and in no distress.  HENT:  Head: Normocephalic.  Mouth/Throat: Oropharynx is clear and moist. No oropharyngeal exudate.  Eyes: Pupils are equal, round, and reactive to light. Conjunctivae are normal. No scleral icterus.  Neck: Normal range of motion. Neck supple.  Cardiovascular: Normal rate.  Irregularly regular   Pulmonary/Chest: Effort normal and breath sounds normal. No respiratory distress. He has no wheezes.  Abdominal: Soft. Bowel sounds are normal. There is no tenderness.  Musculoskeletal: He exhibits edema (2-3+ BLE edema ).  Guarded assistance to get onto exam table -Ambulating with cane   Lymphadenopathy:    He has no cervical adenopathy.       Right: No supraclavicular adenopathy present.       Left: No supraclavicular adenopathy present.  Neurological: He is alert and oriented to person, place, and time. No cranial nerve deficit. Gait normal.  Skin: Skin is warm and dry. No  rash noted.     Skin very dry/scaly, particularly to BLE    Psychiatric: Mood, memory, affect and judgment normal.  Nursing note and vitals reviewed.    LABORATORY DATA:  I have reviewed the labs as listed.  CBC    Component Value Date/Time   WBC 4.0 11/24/2017 1321   RBC 4.69 11/24/2017 1321   HGB 13.9 11/24/2017 1321   HCT 41.3 11/24/2017 1321   PLT 70 (L) 11/24/2017 1321   MCV 88.1 11/24/2017 1321   MCH 29.6 11/24/2017 1321   MCHC 33.7 11/24/2017 1321   RDW 14.7 11/24/2017 1321   LYMPHSABS 1.0 11/24/2017 1321   MONOABS 0.6 11/24/2017 1321   EOSABS 0.2 11/24/2017 1321   BASOSABS 0.0 11/24/2017 1321   CMP Latest Ref Rng & Units 11/24/2017 09/20/2017 09/08/2017  Glucose 65 - 99 mg/dL 88 114(H) 87  BUN 6 - 20 mg/dL 14 16 9   Creatinine 0.61 - 1.24 mg/dL 1.12 0.91 0.91  Sodium 135 - 145 mmol/L 140 139 138  Potassium 3.5 - 5.1 mmol/L 4.2 4.3 4.1  Chloride 101 - 111 mmol/L 107 106 105  CO2 22 - 32 mmol/L 23 25 22   Calcium 8.9 - 10.3 mg/dL 9.3 8.7(L) 9.1  Total Protein 6.5 - 8.1 g/dL 7.2 6.2(L) -  Total Bilirubin 0.3 - 1.2 mg/dL 0.6 0.5 -  Alkaline Phos 38 - 126 U/L 71 61 -  AST 15 - 41 U/L 22 24 -  ALT 17 - 63 U/L 18 31 -   Results for Jaking, Thayer CARLOSDANIEL GROB (MRN [id number])   Ref. Range 11/24/2017 13:21  LDH Latest Ref Range: 98 - 192 U/L 182  Iron Latest Ref Range: 45 - 182 ug/dL 50  UIBC Latest Units: ug/dL 313  TIBC Latest Ref Range: 250 - 450 ug/dL 363  Saturation Ratios Latest Ref Range: 17.9 - 39.5 % 14 (L)  Ferritin Latest Ref Range: 24 - 336 ng/mL 90    PENDING LABS:    DIAGNOSTIC IMAGING:    PATHOLOGY:     ASSESSMENT & PLAN:   DLBLC s/p stem cell transplant:  -Diagnosed in 06/2005. Completed R-CHOP chemotherapy with incomplete response. Went on to have ICE chemotherapy pre-transplant and total body radiation with autologous stem cell transplant on 01/15/06. Completed 2 years of maintenance Rituxan post-transplant.  He has remained NED since that time.  -Clinically remains NED on physical exam today. No B symptoms noted. He  was released from stem cell transplant clinic follow-up several years ago.   -Labs reviewed in detail with patient. He was provided a paper copy of labs as well; all labs are stable, with exception of platelet count (see below).   -Will move his follow-up visits for his h/o lymphoma to annually; he is enthusiastic about this plan.  Will check labs only in 6 months to ensure stability of iron studies and blood counts.   -Return to cancer center in 1 year for follow-up with labs.     Thrombocytopenia:  -New thrombocytopenia noted with plt count 70,000 from labs done on 11/24/17.  He did have hospitalization a few months ago for pneumonia, which required discharge to SNF/Rehab after hospital stay.  He may have a bit of residual bone marrow suppression from this semi-recent infection.  Medications reviewed and no new medications that could be contributing to low platelet count.  He denies any frank bleeding episodes or abnormal bruising.  -Will plan to recheck CBC with diff in ~1 month to re-assess.  If continued or  progressive thrombocytopenia noted at that time, then may need additional work-up at that time.  Will keep monitoring for now.     Iron deficiency anemia:  -Thought to be secondary to chronic GI blood loss; h/o colonoscopy and EGD in 02/2015 revealed probable AVMs.  -Last dose of IV iron in 02/2016.  Oncology Flowsheet 03/16/2016  ferumoxytol [facility]) IV 510 mg  -Iron studies from recent labs on 11/24/17 reviewed with patient.  Ferritin 90; iron sats mildly low at 14%.  Denies any bleeding episodes or dark/tarry stools.  Hgb normal at 13.9 g/dL.  He does endorse fatigue, but attributes this to his current sinus issues.  We have discussed oral iron supplements in the past, which he has historically been resistant to try d/t concerns for side effects.  Offered him 1 dose of IV Feraheme today, given low iron sats.  He prefers to forgo any additional IV iron for now, which is reasonable.  He was given  strict instructions to contact us with any new/worsening symptoms and/or go to closest ED with any active bleeding episodes.  He agrees with this plan.   -Will check labs only in 6 months to ensure stability of his iron stores.     Sinus concerns:  -He has complained of chronic sinusitis/post-nasal drip/cough type symptoms in the past. These symptoms persist.  He has occasional headaches as well.  Afebrile today and denies any recent fevers at home.  Lungs clear to auscultation today.   -Recommended he try OTC allergy medication, like Claritin or Zyrtec, +/- decongestant to see if his symptoms improve. Encouraged him to follow-up with PCP if symptoms remain.    Health maintenance/Wellness promotion:  -Encouraged him to continue follow-up with PCP as indicated for routine physical exams, age/gender appropriate cancer screenings, and vaccinations. He asks about his last pneumonia vaccine, which we unfortunately do not have record of him receiving here.  Recommended he follow-up with his PCP, as they may have a more comprehensive record of his previously given vaccinations.  -Commended his efforts for healthy diet and exercise. He is continuing to work on losing weight and reports being frustrated by how slow his progress has been. Encouraged him to keep up the good work, as tolerated.        Dispo:  -Recheck CBC in ~1 month.  -Labs only in 6 months. (CBC with diff, iron studies)  -Return to cancer center in 1 year for follow-up with labs. (CBC with diff, CMET, iron studies, LDH)    All questions were answered to patient's stated satisfaction. Encouraged patient to call with any new concerns or questions before his next visit to the cancer center and we can certain see him sooner, if needed.      Orders placed this encounter:  Orders Placed This Encounter  Procedures  . Comprehensive metabolic panel  . CBC with Differential/Platelet  . Iron and TIBC  . Ferritin  . CBC with  Differential/Platelet  . Lactate dehydrogenase      Mike Craze, NP [place] [phone]

## 2017-11-25 NOTE — Patient Instructions (Signed)
[place] at [facility] Discharge Instructions  You were seen today by Mike Craze NP. Try using Claritin or Zyrtec over the counter to help with your allergies. Return in 6 months for labs. Return in 1 year for labs and follow up.    Thank you for choosing [place] at [facility] to provide your oncology and hematology care.  To afford each patient quality time with our provider, please arrive at least 15 minutes before your scheduled appointment time.   If you have a lab appointment with the [facility] please come in thru the  Main Entrance and check in at the main information desk  You need to re-schedule your appointment should you arrive 10 or more minutes late.  We strive to give you quality time with our providers, and arriving late affects you and other patients whose appointments are after yours.  Also, if you no show three or more times for appointments you may be dismissed from the clinic at the providers discretion.     Again, thank you for choosing [facility].  Our hope is that these requests will decrease the amount of time that you wait before being seen by our physicians.       _____________________________________________________________  Should you have questions after your visit to [facility], please contact our office at [phone] between the hours of 8:30 a.m. and 4:30 p.m.  Voicemails left after 4:30 p.m. will not be returned until the following business day.  For prescription refill requests, have your pharmacy contact our office.       Resources For Cancer Patients and their Caregivers ? American Cancer Society: Can assist with transportation, wigs, general needs, runs Look Good Feel Better.        (302) 197-6387 ? Cancer Care: Provides financial assistance, online support groups, medication/co-pay assistance.  1-800-813-HOPE 940-777-4594) ? [place] Assists [place] cancer patients and their families through emotional , educational and financial support.  417 175 2050 ? [place] DSS Where to apply for food stamps, Medicaid and utility assistance. 850-143-9092 ? [facility]: Transportation to medical appointments. 425-173-3196 ? Social Security Administration: May apply for disability if have a Stage IV cancer. (906)519-6583 724 720 9040 ? [employer], Disability and Transit Services: Assists with nutrition, care and transit needs. [facility] Support Programs:   > Cancer Support Group  2nd Tuesday of the month 1pm-2pm, Journey Room   > Creative Journey  3rd Tuesday of the month 1130am-1pm, Journey Room

## 2017-11-30 DIAGNOSIS — I10 Essential (primary) hypertension: Secondary | ICD-10-CM | POA: Diagnosis not present

## 2017-11-30 DIAGNOSIS — I4891 Unspecified atrial fibrillation: Secondary | ICD-10-CM | POA: Diagnosis not present

## 2017-11-30 DIAGNOSIS — Z6839 Body mass index (BMI) 39.0-39.9, adult: Secondary | ICD-10-CM | POA: Diagnosis not present

## 2017-11-30 DIAGNOSIS — E1129 Type 2 diabetes mellitus with other diabetic kidney complication: Secondary | ICD-10-CM | POA: Diagnosis not present

## 2017-11-30 DIAGNOSIS — I8311 Varicose veins of right lower extremity with inflammation: Secondary | ICD-10-CM | POA: Diagnosis not present

## 2017-11-30 DIAGNOSIS — I8312 Varicose veins of left lower extremity with inflammation: Secondary | ICD-10-CM | POA: Diagnosis not present

## 2017-11-30 DIAGNOSIS — E782 Mixed hyperlipidemia: Secondary | ICD-10-CM | POA: Diagnosis not present

## 2017-11-30 DIAGNOSIS — B351 Tinea unguium: Secondary | ICD-10-CM | POA: Diagnosis not present

## 2017-11-30 DIAGNOSIS — J329 Chronic sinusitis, unspecified: Secondary | ICD-10-CM | POA: Diagnosis not present

## 2017-11-30 DIAGNOSIS — E669 Obesity, unspecified: Secondary | ICD-10-CM | POA: Diagnosis not present

## 2017-11-30 DIAGNOSIS — I2510 Atherosclerotic heart disease of native coronary artery without angina pectoris: Secondary | ICD-10-CM | POA: Diagnosis not present

## 2017-12-01 DIAGNOSIS — E119 Type 2 diabetes mellitus without complications: Secondary | ICD-10-CM | POA: Diagnosis not present

## 2017-12-01 DIAGNOSIS — E1129 Type 2 diabetes mellitus with other diabetic kidney complication: Secondary | ICD-10-CM | POA: Diagnosis not present

## 2017-12-08 ENCOUNTER — Encounter: Payer: Self-pay | Admitting: Cardiovascular Disease

## 2017-12-08 ENCOUNTER — Ambulatory Visit (INDEPENDENT_AMBULATORY_CARE_PROVIDER_SITE_OTHER): Payer: Medicare Other | Admitting: Cardiovascular Disease

## 2017-12-08 VITALS — BP 150/84 | HR 90 | Ht 71.0 in | Wt 268.0 lb

## 2017-12-08 DIAGNOSIS — Z7901 Long term (current) use of anticoagulants: Secondary | ICD-10-CM

## 2017-12-08 DIAGNOSIS — I4892 Unspecified atrial flutter: Secondary | ICD-10-CM

## 2017-12-08 DIAGNOSIS — I10 Essential (primary) hypertension: Secondary | ICD-10-CM | POA: Diagnosis not present

## 2017-12-08 DIAGNOSIS — I5032 Chronic diastolic (congestive) heart failure: Secondary | ICD-10-CM

## 2017-12-08 NOTE — Progress Notes (Signed)
SUBJECTIVE: The patient presents for follow-up of atrial flutter and chronic diastolic heart failure.  He declined a diuretic at the time of office visit on 09/23/17.  He also has a history of diffuse large B-cell lymphoma status post bone marrow transplant.  He recently developed a cough productive of phlegm.  He was prescribed an antibiotic by his PCP.  He denies chest pain, fevers, and chills.  He said his systolic blood pressure at his PCPs office generally ranges in the 120-130 range.    Review of Systems: As per "subjective", otherwise negative.  Allergies  Allergen Reactions  . Penicillins Rash    Has patient had a PCN reaction causing immediate rash, facial/tongue/throat swelling, SOB or lightheadedness with hypotension: Unknown Has patient had a PCN reaction causing severe rash involving mucus membranes or skin necrosis: Unknown Has patient had a PCN reaction that required hospitalization: Unknown Has patient had a PCN reaction occurring within the last 10 years: No If all of the above answers are "NO", then may proceed with Cephalosporin use.     Current Outpatient Medications  Medication Sig Dispense Refill  . apixaban (ELIQUIS) 5 MG TABS tablet Take 1 tablet (5 mg total) by mouth 2 (two) times daily. 60 tablet 11  . furosemide (LASIX) 20 MG tablet take 1 tablet by mouth once daily if needed  0  . glimepiride (AMARYL) 1 MG tablet take 1/2 tablet by mouth once daily WITH BREAKFAST (Patient taking differently: Take 1 mg by mouth daily with breakfast. take 1 tablet by mouth twice daily WITH BREAKFAST) 15 tablet 0  . ipratropium-albuterol (DUONEB) 0.5-2.5 (3) MG/3ML SOLN Take 3 mLs by nebulization every 8 (eight) hours. 360 mL   . metoprolol tartrate (LOPRESSOR) 25 MG tablet Take 1 tablet (25 mg total) by mouth 2 (two) times daily. 180 tablet 3   No current facility-administered medications for this visit.     Past Medical History:  Diagnosis Date  . Asthma   .  Diabetes mellitus without complication ([place])   . DLBCL (diffuse large B cell lymphoma) ([place]) 02/28/2009  . Edema   . Heart failure, diastolic, chronic ([place])    patient denies  . Hyperlipidemia, mixed   . Hypertension   . IDA (iron deficiency anemia)   . Large cell lymphoma ([place]) 01/2005   autologous stem cell transplant 12/2005  . Obesity, morbid (more than 100 lbs over ideal weight or BMI > 40) (HCC)   . Venous insufficiency 04/29/2011    Past Surgical History:  Procedure Laterality Date  . BACK SURGERY  2006   T6 vertebrae removed/titanium placed  . COLONOSCOPY  11/24/2004   Polyps in the left colon ablated/removed as described above.  Two submucosal lesions consistent with lipomas as described above not  manipulated./ Normal rectum  . COLONOSCOPY  06/20/2012   MULTIPLE RECTAL AND COLONIC POLYPS  . COLONOSCOPY N/A 03/08/2015   RMR: Capacious, redundant colon. Multiple colonic and rectosigmoid polyps removed. ablated as described above. colonic lipoma abnormal appearing terminal ileum likely a variant of normal). Howeverwith history  biopsies obtained.   . ESOPHAGOGASTRODUODENOSCOPY N/A 03/08/2015   RMR: Hiatal hernia Polypoid gastric mucosa with multiple gastric polyps. largest polyp removed via snare polypectomgy hemostasis clip placed at base. Status post gastric biopsy. Status post video capsule placement.   Marland Kitchen GIVENS CAPSULE STUDY N/A 03/08/2015   Procedure: GIVENS CAPSULE STUDY;  Surgeon: Daneil Dolin, MD;  [facility];  Service: Endoscopy;  Laterality: N/A;  .  LIMBAL STEM CELL TRANSPLANT    . LUNG BIOPSY  6/06  . MULTIPLE TOOTH EXTRACTIONS  04/2005  . port a cath placement    . PORT-A-CATH REMOVAL  09/08/2012   Procedure: REMOVAL PORT-A-CATH;  Surgeon: Melrose Nakayama, MD;  Location: [facility] OR;  Service: Thoracic;  Laterality: N/A;    Social History   Socioeconomic History  . Marital status: Married    Spouse name: Not on file  . Number of children: 2  . Years of  education: Not on file  . Highest education level: Not on file  Occupational History  . Occupation: disability due to back    Employer: UNEMPLOYED  Social Needs  . Financial resource strain: Not on file  . Food insecurity:    Worry: Not on file    Inability: Not on file  . Transportation needs:    Medical: Not on file    Non-medical: Not on file  Tobacco Use  . Smoking status: Former Smoker    Packs/day: 0.50    Years: 15.00    Pack years: 7.50    Last attempt to quit: 11/11/2002    Years since quitting: 15.0  . Smokeless tobacco: Never Used  Substance and Sexual Activity  . Alcohol use: No    Alcohol/week: 0.0 oz  . Drug use: No  . Sexual activity: Not on file  Lifestyle  . Physical activity:    Days per week: Not on file    Minutes per session: Not on file  . Stress: Not on file  Relationships  . Social connections:    Talks on phone: Not on file    Gets together: Not on file    Attends religious service: Not on file    Active member of club or organization: Not on file    Attends meetings of clubs or organizations: Not on file    Relationship status: Not on file  . Intimate partner violence:    Fear of current or ex partner: Not on file    Emotionally abused: Not on file    Physically abused: Not on file    Forced sexual activity: Not on file  Other Topics Concern  . Not on file  Social History Narrative   Married   No regular exercise     Vitals:   12/08/17 1607  BP: (!) 150/84  Pulse: 90  SpO2: 94%  Weight: 268 lb (121.6 kg)  Height: 5\' 11"  (1.803 m)    Wt Readings from Last 3 Encounters:  12/08/17 268 lb (121.6 kg)  11/25/17 274 lb 8 oz (124.5 kg)  09/23/17 271 lb (122.9 kg)     PHYSICAL EXAM General: NAD HEENT: Normal. Neck: No JVD, no thyromegaly. Lungs: Faint scattered inspiratory wheezes, no crackles. CV: Regular rate and rhythm, normal S1/S2, no S3/S4, no murmur.  Chronic bilateral lower extremity edema with  lipodermatosclerosis. Abdomen: Soft, nontender, no distention.  Neurologic: Alert and oriented.  Psych: Normal affect. Skin: Bilateral lower extremity lipodermatosclerosis. Musculoskeletal: No gross deformities.    ECG: Most recent ECG reviewed.   Labs: Lab Results  Component Value Date/Time   K 4.2 11/24/2017 01:21 PM   BUN 14 11/24/2017 01:21 PM   CREATININE 1.12 11/24/2017 01:21 PM   ALT 18 11/24/2017 01:21 PM   TSH 1.122 09/08/2017 05:15 AM   HGB 13.9 11/24/2017 01:21 PM     Lipids: No results found for: LDLCALC, LDLDIRECT, CHOL, TRIG, HDL     ASSESSMENT AND PLAN: 1.  Atrial  flutter: Symptomatically stable.  Heart rate is controlled on metoprolol tartrate 25 mg twice daily.  Systemically anticoagulated with Eliquis 5 mg twice daily.  2.  Hypertension: Blood pressure is elevated.  He said it is usually normal at his PCPs office.  I told him to continue to monitor this to see if further antihypertensive titration is indicated.  3.  Chronic diastolic heart failure: Blood pressure is elevated. He said it is usually normal at his PCPs office.  I told him to continue to monitor this to see if further antihypertensive titration is indicated.  He takes Lasix 20 mg as needed.   Disposition: Follow up 1 year.   Kate Sable, M.D., F.A.C.C.

## 2017-12-08 NOTE — Patient Instructions (Signed)
Your physician wants you to follow-up in: 1 year with Dr.Koneswaran You will receive a reminder letter in the mail two months in advance. If you don't receive a letter, please call our office to schedule the follow-up appointment.    Your physician recommends that you continue on your current medications as directed. Please refer to the Current Medication list given to you today.   If you need a refill on your cardiac medications before your next appointment, please call your pharmacy.    No lab work or tests ordered      Thank you for choosing [facility] !

## 2017-12-27 ENCOUNTER — Inpatient Hospital Stay (HOSPITAL_COMMUNITY): Payer: Medicare Other | Attending: Hematology

## 2017-12-27 DIAGNOSIS — Z8572 Personal history of non-Hodgkin lymphomas: Secondary | ICD-10-CM | POA: Diagnosis not present

## 2017-12-27 DIAGNOSIS — D509 Iron deficiency anemia, unspecified: Secondary | ICD-10-CM

## 2017-12-27 DIAGNOSIS — C8330 Diffuse large B-cell lymphoma, unspecified site: Secondary | ICD-10-CM

## 2017-12-27 DIAGNOSIS — D696 Thrombocytopenia, unspecified: Secondary | ICD-10-CM

## 2017-12-27 LAB — CBC WITH DIFFERENTIAL/PLATELET
BASOS ABS: 0 10*3/uL (ref 0.0–0.1)
BASOS PCT: 0 %
Eosinophils Absolute: 0.4 10*3/uL (ref 0.0–0.7)
Eosinophils Relative: 6 %
HCT: 43.4 % (ref 39.0–52.0)
HEMOGLOBIN: 14.3 g/dL (ref 13.0–17.0)
Lymphocytes Relative: 16 %
Lymphs Abs: 1.1 10*3/uL (ref 0.7–4.0)
MCH: 30.3 pg (ref 26.0–34.0)
MCHC: 32.9 g/dL (ref 30.0–36.0)
MCV: 91.9 fL (ref 78.0–100.0)
MONOS PCT: 13 %
Monocytes Absolute: 0.9 10*3/uL (ref 0.1–1.0)
NEUTROS PCT: 65 %
Neutro Abs: 4.3 10*3/uL (ref 1.7–7.7)
Platelets: 102 10*3/uL — ABNORMAL LOW (ref 150–400)
RBC: 4.72 MIL/uL (ref 4.22–5.81)
RDW: 15.6 % — ABNORMAL HIGH (ref 11.5–15.5)
WBC: 6.7 10*3/uL (ref 4.0–10.5)

## 2017-12-30 ENCOUNTER — Other Ambulatory Visit (HOSPITAL_COMMUNITY): Payer: Self-pay | Admitting: *Deleted

## 2017-12-30 DIAGNOSIS — C8330 Diffuse large B-cell lymphoma, unspecified site: Secondary | ICD-10-CM

## 2017-12-30 DIAGNOSIS — D696 Thrombocytopenia, unspecified: Secondary | ICD-10-CM

## 2018-01-10 ENCOUNTER — Other Ambulatory Visit: Payer: Self-pay | Admitting: Internal Medicine

## 2018-01-24 ENCOUNTER — Other Ambulatory Visit: Payer: Self-pay | Admitting: *Deleted

## 2018-01-24 MED ORDER — APIXABAN 5 MG PO TABS: 5.0000 mg | ORAL_TABLET | Freq: Two times a day (BID) | ORAL | 11 refills | Status: DC

## 2018-02-09 ENCOUNTER — Ambulatory Visit (HOSPITAL_COMMUNITY)
Admission: RE | Admit: 2018-02-09 | Discharge: 2018-02-09 | Disposition: A | Discharge status: Home / Self Care | Payer: Medicare Other | Source: Ambulatory Visit | Attending: Internal Medicine | Admitting: Internal Medicine

## 2018-02-09 ENCOUNTER — Other Ambulatory Visit (HOSPITAL_COMMUNITY): Payer: Self-pay | Admitting: Internal Medicine

## 2018-02-09 DIAGNOSIS — E119 Type 2 diabetes mellitus without complications: Secondary | ICD-10-CM | POA: Diagnosis not present

## 2018-02-09 DIAGNOSIS — M1991 Primary osteoarthritis, unspecified site: Secondary | ICD-10-CM | POA: Diagnosis not present

## 2018-02-09 DIAGNOSIS — Z0001 Encounter for general adult medical examination with abnormal findings: Secondary | ICD-10-CM | POA: Diagnosis not present

## 2018-02-09 DIAGNOSIS — R059 Cough, unspecified: Secondary | ICD-10-CM

## 2018-02-09 DIAGNOSIS — I10 Essential (primary) hypertension: Secondary | ICD-10-CM | POA: Diagnosis not present

## 2018-02-09 DIAGNOSIS — R05 Cough: Secondary | ICD-10-CM | POA: Diagnosis not present

## 2018-02-09 DIAGNOSIS — J418 Mixed simple and mucopurulent chronic bronchitis: Secondary | ICD-10-CM | POA: Diagnosis not present

## 2018-02-09 DIAGNOSIS — Z6841 Body Mass Index (BMI) 40.0 and over, adult: Secondary | ICD-10-CM | POA: Diagnosis not present

## 2018-02-09 DIAGNOSIS — J984 Other disorders of lung: Secondary | ICD-10-CM | POA: Diagnosis not present

## 2018-02-09 DIAGNOSIS — E782 Mixed hyperlipidemia: Secondary | ICD-10-CM | POA: Diagnosis not present

## 2018-02-09 DIAGNOSIS — Z1389 Encounter for screening for other disorder: Secondary | ICD-10-CM | POA: Diagnosis not present

## 2018-03-02 ENCOUNTER — Inpatient Hospital Stay (HOSPITAL_COMMUNITY): Payer: Medicare Other | Attending: Hematology

## 2018-03-02 DIAGNOSIS — D696 Thrombocytopenia, unspecified: Secondary | ICD-10-CM

## 2018-03-02 DIAGNOSIS — D509 Iron deficiency anemia, unspecified: Secondary | ICD-10-CM | POA: Diagnosis not present

## 2018-03-02 DIAGNOSIS — Z8572 Personal history of non-Hodgkin lymphomas: Secondary | ICD-10-CM | POA: Insufficient documentation

## 2018-03-02 DIAGNOSIS — C8330 Diffuse large B-cell lymphoma, unspecified site: Secondary | ICD-10-CM

## 2018-03-02 LAB — CBC WITH DIFFERENTIAL/PLATELET
Basophils Absolute: 0 10*3/uL (ref 0.0–0.1)
Basophils Relative: 1 %
Eosinophils Absolute: 0.4 10*3/uL (ref 0.0–0.7)
Eosinophils Relative: 8 %
HCT: 42.4 % (ref 39.0–52.0)
Hemoglobin: 14.1 g/dL (ref 13.0–17.0)
Lymphocytes Relative: 34 %
Lymphs Abs: 1.6 10*3/uL (ref 0.7–4.0)
MCH: 30.9 pg (ref 26.0–34.0)
MCHC: 33.3 g/dL (ref 30.0–36.0)
MCV: 92.8 fL (ref 78.0–100.0)
Monocytes Absolute: 0.8 10*3/uL (ref 0.1–1.0)
Monocytes Relative: 16 %
Neutro Abs: 2 10*3/uL (ref 1.7–7.7)
Neutrophils Relative %: 41 %
Platelets: 139 10*3/uL — ABNORMAL LOW (ref 150–400)
RBC: 4.57 MIL/uL (ref 4.22–5.81)
RDW: 15.1 % (ref 11.5–15.5)
WBC: 4.8 10*3/uL (ref 4.0–10.5)

## 2018-05-27 ENCOUNTER — Inpatient Hospital Stay (HOSPITAL_COMMUNITY): Payer: Medicare Other | Attending: Hematology

## 2018-05-27 DIAGNOSIS — Z8572 Personal history of non-Hodgkin lymphomas: Secondary | ICD-10-CM | POA: Diagnosis not present

## 2018-05-27 DIAGNOSIS — D509 Iron deficiency anemia, unspecified: Secondary | ICD-10-CM | POA: Diagnosis not present

## 2018-05-27 DIAGNOSIS — D696 Thrombocytopenia, unspecified: Secondary | ICD-10-CM

## 2018-05-27 DIAGNOSIS — C8330 Diffuse large B-cell lymphoma, unspecified site: Secondary | ICD-10-CM

## 2018-05-27 LAB — CBC WITH DIFFERENTIAL/PLATELET
BASOS PCT: 1 %
Basophils Absolute: 0 10*3/uL (ref 0.0–0.1)
EOS ABS: 0.3 10*3/uL (ref 0.0–0.7)
EOS PCT: 7 %
HCT: 43.6 % (ref 39.0–52.0)
Hemoglobin: 14.8 g/dL (ref 13.0–17.0)
Lymphocytes Relative: 35 %
Lymphs Abs: 1.4 10*3/uL (ref 0.7–4.0)
MCH: 31.5 pg (ref 26.0–34.0)
MCHC: 33.9 g/dL (ref 30.0–36.0)
MCV: 92.8 fL (ref 78.0–100.0)
MONO ABS: 0.6 10*3/uL (ref 0.1–1.0)
Monocytes Relative: 14 %
Neutro Abs: 1.7 10*3/uL (ref 1.7–7.7)
Neutrophils Relative %: 43 %
Platelets: 151 10*3/uL (ref 150–400)
RBC: 4.7 MIL/uL (ref 4.22–5.81)
RDW: 14.5 % (ref 11.5–15.5)
WBC: 3.9 10*3/uL — ABNORMAL LOW (ref 4.0–10.5)

## 2018-05-27 LAB — IRON AND TIBC
IRON: 60 ug/dL (ref 45–182)
Saturation Ratios: 15 % — ABNORMAL LOW (ref 17.9–39.5)
TIBC: 408 ug/dL (ref 250–450)
UIBC: 348 ug/dL

## 2018-05-27 LAB — FERRITIN: Ferritin: 53 ng/mL (ref 24–336)

## 2018-09-06 DIAGNOSIS — E1129 Type 2 diabetes mellitus with other diabetic kidney complication: Secondary | ICD-10-CM | POA: Diagnosis not present

## 2018-09-06 DIAGNOSIS — I10 Essential (primary) hypertension: Secondary | ICD-10-CM | POA: Diagnosis not present

## 2018-09-06 DIAGNOSIS — E119 Type 2 diabetes mellitus without complications: Secondary | ICD-10-CM | POA: Diagnosis not present

## 2018-09-06 DIAGNOSIS — Z1389 Encounter for screening for other disorder: Secondary | ICD-10-CM | POA: Diagnosis not present

## 2018-09-06 DIAGNOSIS — Z0000 Encounter for general adult medical examination without abnormal findings: Secondary | ICD-10-CM | POA: Diagnosis not present

## 2018-09-06 DIAGNOSIS — Z0001 Encounter for general adult medical examination with abnormal findings: Secondary | ICD-10-CM | POA: Diagnosis not present

## 2018-09-22 DIAGNOSIS — Z23 Encounter for immunization: Secondary | ICD-10-CM | POA: Diagnosis not present

## 2018-11-02 DIAGNOSIS — R05 Cough: Secondary | ICD-10-CM | POA: Diagnosis not present

## 2018-11-02 DIAGNOSIS — R0602 Shortness of breath: Secondary | ICD-10-CM | POA: Diagnosis not present

## 2018-11-18 ENCOUNTER — Other Ambulatory Visit (HOSPITAL_COMMUNITY): Payer: Self-pay | Admitting: Nurse Practitioner

## 2018-11-18 DIAGNOSIS — C8330 Diffuse large B-cell lymphoma, unspecified site: Secondary | ICD-10-CM

## 2018-11-18 DIAGNOSIS — D509 Iron deficiency anemia, unspecified: Secondary | ICD-10-CM

## 2018-11-18 DIAGNOSIS — D696 Thrombocytopenia, unspecified: Secondary | ICD-10-CM

## 2018-11-28 ENCOUNTER — Other Ambulatory Visit (HOSPITAL_COMMUNITY): Payer: Medicare Other

## 2018-11-28 ENCOUNTER — Ambulatory Visit (HOSPITAL_COMMUNITY): Payer: Medicare Other | Admitting: Hematology

## 2018-11-28 ENCOUNTER — Ambulatory Visit (HOSPITAL_COMMUNITY): Payer: Medicare Other | Admitting: Nurse Practitioner

## 2018-12-23 ENCOUNTER — Other Ambulatory Visit: Payer: Self-pay

## 2018-12-23 ENCOUNTER — Inpatient Hospital Stay (HOSPITAL_COMMUNITY): Payer: Medicare Other | Attending: Nurse Practitioner

## 2018-12-23 DIAGNOSIS — D696 Thrombocytopenia, unspecified: Secondary | ICD-10-CM | POA: Insufficient documentation

## 2018-12-23 DIAGNOSIS — J45909 Unspecified asthma, uncomplicated: Secondary | ICD-10-CM | POA: Diagnosis not present

## 2018-12-23 DIAGNOSIS — C8330 Diffuse large B-cell lymphoma, unspecified site: Secondary | ICD-10-CM | POA: Insufficient documentation

## 2018-12-23 DIAGNOSIS — Z923 Personal history of irradiation: Secondary | ICD-10-CM | POA: Insufficient documentation

## 2018-12-23 DIAGNOSIS — Z87891 Personal history of nicotine dependence: Secondary | ICD-10-CM | POA: Insufficient documentation

## 2018-12-23 DIAGNOSIS — I10 Essential (primary) hypertension: Secondary | ICD-10-CM | POA: Diagnosis not present

## 2018-12-23 DIAGNOSIS — Z7984 Long term (current) use of oral hypoglycemic drugs: Secondary | ICD-10-CM | POA: Insufficient documentation

## 2018-12-23 DIAGNOSIS — Z9484 Stem cells transplant status: Secondary | ICD-10-CM | POA: Insufficient documentation

## 2018-12-23 DIAGNOSIS — Z7901 Long term (current) use of anticoagulants: Secondary | ICD-10-CM | POA: Insufficient documentation

## 2018-12-23 DIAGNOSIS — Z7951 Long term (current) use of inhaled steroids: Secondary | ICD-10-CM | POA: Diagnosis not present

## 2018-12-23 DIAGNOSIS — D509 Iron deficiency anemia, unspecified: Secondary | ICD-10-CM | POA: Diagnosis not present

## 2018-12-23 DIAGNOSIS — Z9221 Personal history of antineoplastic chemotherapy: Secondary | ICD-10-CM | POA: Insufficient documentation

## 2018-12-23 DIAGNOSIS — E119 Type 2 diabetes mellitus without complications: Secondary | ICD-10-CM | POA: Insufficient documentation

## 2018-12-23 DIAGNOSIS — Z79899 Other long term (current) drug therapy: Secondary | ICD-10-CM | POA: Diagnosis not present

## 2018-12-23 LAB — COMPREHENSIVE METABOLIC PANEL
ALT: 17 U/L (ref 0–44)
AST: 17 U/L (ref 15–41)
Albumin: 3.8 g/dL (ref 3.5–5.0)
Alkaline Phosphatase: 73 U/L (ref 38–126)
Anion gap: 9 (ref 5–15)
BUN: 14 mg/dL (ref 8–23)
CO2: 26 mmol/L (ref 22–32)
Calcium: 9.2 mg/dL (ref 8.9–10.3)
Chloride: 105 mmol/L (ref 98–111)
Creatinine, Ser: 1.09 mg/dL (ref 0.61–1.24)
GFR calc Af Amer: >60 mL/min (ref >60)
GFR calc non Af Amer: >60 mL/min (ref >60)
Glucose, Bld: 117 mg/dL — ABNORMAL HIGH (ref 70–99)
Potassium: 4.1 mmol/L (ref 3.5–5.1)
Sodium: 140 mmol/L (ref 135–145)
Total Bilirubin: 0.6 mg/dL (ref 0.3–1.2)
Total Protein: 7.2 g/dL (ref 6.5–8.1)

## 2018-12-23 LAB — CBC WITH DIFFERENTIAL/PLATELET
Abs Immature Granulocytes: 0.01 10*3/uL (ref 0.00–0.07)
Basophils Absolute: 0 10*3/uL (ref 0.0–0.1)
Basophils Relative: 1 %
Eosinophils Absolute: 0.3 10*3/uL (ref 0.0–0.5)
Eosinophils Relative: 7 %
HCT: 44.3 % (ref 39.0–52.0)
Hemoglobin: 14.5 g/dL (ref 13.0–17.0)
Immature Granulocytes: 0 %
Lymphocytes Relative: 24 %
Lymphs Abs: 1 10*3/uL (ref 0.7–4.0)
MCH: 31 pg (ref 26.0–34.0)
MCHC: 32.7 g/dL (ref 30.0–36.0)
MCV: 94.7 fL (ref 80.0–100.0)
Monocytes Absolute: 0.6 10*3/uL (ref 0.1–1.0)
Monocytes Relative: 16 %
Neutro Abs: 2.1 10*3/uL (ref 1.7–7.7)
Neutrophils Relative %: 52 %
Platelets: 189 10*3/uL (ref 150–400)
RBC: 4.68 MIL/uL (ref 4.22–5.81)
RDW: 14.3 % (ref 11.5–15.5)
WBC: 4 10*3/uL (ref 4.0–10.5)
nRBC: 0 % (ref 0.0–0.2)

## 2018-12-23 LAB — IRON AND TIBC
Iron: 63 ug/dL (ref 45–182)
Saturation Ratios: 16 % — ABNORMAL LOW (ref 17.9–39.5)
TIBC: 402 ug/dL (ref 250–450)
UIBC: 339 ug/dL

## 2018-12-23 LAB — VITAMIN B12: Vitamin B-12: 578 pg/mL (ref 180–914)

## 2018-12-23 LAB — LACTATE DEHYDROGENASE: LDH: 189 U/L (ref 98–192)

## 2018-12-23 LAB — FOLATE: Folate: 19.5 ng/mL (ref >5.9)

## 2018-12-23 LAB — FERRITIN: Ferritin: 55 ng/mL (ref 24–336)

## 2018-12-28 ENCOUNTER — Encounter (HOSPITAL_COMMUNITY): Payer: Self-pay | Admitting: Nurse Practitioner

## 2018-12-28 ENCOUNTER — Other Ambulatory Visit: Payer: Self-pay

## 2018-12-28 ENCOUNTER — Inpatient Hospital Stay (HOSPITAL_COMMUNITY): Payer: Medicare Other | Admitting: Nurse Practitioner

## 2018-12-28 DIAGNOSIS — D509 Iron deficiency anemia, unspecified: Secondary | ICD-10-CM | POA: Diagnosis not present

## 2018-12-28 DIAGNOSIS — J45909 Unspecified asthma, uncomplicated: Secondary | ICD-10-CM | POA: Diagnosis not present

## 2018-12-28 DIAGNOSIS — E119 Type 2 diabetes mellitus without complications: Secondary | ICD-10-CM

## 2018-12-28 DIAGNOSIS — C8330 Diffuse large B-cell lymphoma, unspecified site: Secondary | ICD-10-CM | POA: Diagnosis not present

## 2018-12-28 DIAGNOSIS — I10 Essential (primary) hypertension: Secondary | ICD-10-CM

## 2018-12-28 DIAGNOSIS — Z7984 Long term (current) use of oral hypoglycemic drugs: Secondary | ICD-10-CM

## 2018-12-28 DIAGNOSIS — Z9221 Personal history of antineoplastic chemotherapy: Secondary | ICD-10-CM | POA: Diagnosis not present

## 2018-12-28 DIAGNOSIS — Z7901 Long term (current) use of anticoagulants: Secondary | ICD-10-CM

## 2018-12-28 DIAGNOSIS — D696 Thrombocytopenia, unspecified: Secondary | ICD-10-CM | POA: Diagnosis not present

## 2018-12-28 DIAGNOSIS — Z87891 Personal history of nicotine dependence: Secondary | ICD-10-CM

## 2018-12-28 DIAGNOSIS — Z9484 Stem cells transplant status: Secondary | ICD-10-CM

## 2018-12-28 DIAGNOSIS — Z923 Personal history of irradiation: Secondary | ICD-10-CM

## 2018-12-28 DIAGNOSIS — Z79899 Other long term (current) drug therapy: Secondary | ICD-10-CM

## 2018-12-28 DIAGNOSIS — Z7951 Long term (current) use of inhaled steroids: Secondary | ICD-10-CM

## 2018-12-28 NOTE — Patient Instructions (Signed)
[place] at [facility] Discharge Instructions  Follow up in 1 year with repeat labs. Please have your labs checked in 6 months.   Thank you for choosing [place] at [facility] to provide your oncology and hematology care.  To afford each patient quality time with our provider, please arrive at least 15 minutes before your scheduled appointment time.   If you have a lab appointment with the [facility] please come in thru the  Main Entrance and check in at the main information desk  You need to re-schedule your appointment should you arrive 10 or more minutes late.  We strive to give you quality time with our providers, and arriving late affects you and other patients whose appointments are after yours.  Also, if you no show three or more times for appointments you may be dismissed from the clinic at the providers discretion.     Again, thank you for choosing [facility].  Our hope is that these requests will decrease the amount of time that you wait before being seen by our physicians.       _____________________________________________________________  Should you have questions after your visit to [facility], please contact our office at [phone] between the hours of 8:00 a.m. and 4:30 p.m.  Voicemails left after 4:00 p.m. will not be returned until the following business day.  For prescription refill requests, have your pharmacy contact our office and allow 72 hours.    Cancer Center Support Programs:   > Cancer Support Group  2nd Tuesday of the month 1pm-2pm, Journey Room

## 2018-12-28 NOTE — Assessment & Plan Note (Signed)
1.  DLBLC status post stem cell transplant: - Patient was diagnosed 06/2005. -She completed R-CHOP chemotherapy with incomplete response. -She went on to have ICE chemotherapy pre-transplant and total body radiation with autologous stem cell transplant on 01/15/2006. -He completed 2 years of maintenance Rituxan post transplant. -He has remained NED since that time. -He was released from the stem cell transplant clinic a few years ago. - Physical exam today was negative.  Patient denies any B symptoms. - We will continue to check labs every 6 months to ensure stability due to his thrombocytopenia and iron deficiency. - He can follow-up in 1 year with labs.  2.  Thrombocytopenia: - Patient was found to have thrombocytopenia on 11/24/2017 with his labs. -The initial platelet count was 70,000. - Around that time he was hospitalized for pneumonia. - He was on antibiotics but no other new medications contributing to his low platelet count. -He denies any bright red bleeding or melena. -Labs on 12/23/2018 showed his platelet counts at 189. - We will continue to monitor.  3.  Iron deficiency anemia: - This is thought to be secondary to chronic GI blood loss. - His last dose of Feraheme was on 03/16/2016. - He had a colonoscopy and EGD in 02/2015 which revealed probable AVMs. -He does report fatigue but denies any bleeding. - He has tried oral iron supplements in the past and was unable to tolerate them due to side effects. - Labs on 12/23/2018 showed his ferritin 55, percent saturation at 16, his hemoglobin was 14.5. - He wishes to hold off on any IV iron at this time. - We will continue to follow him every 6 months with labs.

## 2018-12-28 NOTE — Progress Notes (Signed)
[place], [place] [address]   CLINIC:  Medical Oncology/Hematology  PCP:  [facility], [place] [address] [phone]   REASON FOR VISIT: Follow-up for  DLBCL s/p stem cell transplant (2007) AND Iron deficiency anemia   CURRENT THERAPY: Observation AND IV iron prn   BRIEF ONCOLOGIC HISTORY:  Oncology History   S/P chemotherapy following stabilization of the spine.  Initial biopsy was on 07/13/2005 on his bone marrow.  Thoracic vertebra was biopsied on 02/04/2005 and mediastinum on 02/03/2005.  S/P R-CHOP and achieved an incomplete response, then underwent ICE chemotherapy pre-transplant and total body radiation with autologous stem cell transplant on 01/15/2006 and maintained on Rituxan for 2 years.  Thus far without recurrence.     DLBCL (diffuse large B cell lymphoma) ([place])   01/27/2005 Imaging    PET- diffuse activity  involving the L cervical area, extensive activity in the mediastinumn particularly in the R paratracheal area and to a lesser degree in the anterior mediastinum.  Lg area in porta caval nodes in upper abd and diffuse bone activity    02/03/2005 Pathology Results    Mediastinal lymph node demonstrating diffuse large cell lymphoma, B-type.  Vertebral bone biopsy demonstrating diffuse large b-cell lymphoma    03/11/2005 - 08/04/2005 Chemotherapy    R-CHOP x 8 cycles    05/04/2005 Imaging    PET- near resolution of previously identified uptake.    07/13/2005 Bone Marrow Biopsy    Bone marrow aspiration and biopsy is negative for lymphoma    08/31/2005 Imaging    PET- further response to therapy with findings consistent with residual disease.  Hypermetabolic activity identified within the anterior mediastinal lymph node (smaller but still pathologic).  Borderline hypermetabolic small right pleural effusion    10/20/2005 - 11/13/2005 Chemotherapy    R-ICE x 2 cycles at [facility]    01/08/2006 - 01/09/2006  Chemotherapy    Cyclophosphamide 600 mg daily x 2    01/10/2006 - 01/14/2006 Radiation Therapy    Total body radiation twice daily    01/15/2006 Bone Marrow Transplant    [facility] at [facility]    04/05/2006 - 12/05/2007 Chemotherapy    Rituxan weekly x 4 every 6 months x 2 years      CANCER STAGING: Cancer Staging DLBCL (diffuse large B cell lymphoma) (HCC) Staging form: Lymphoid Neoplasms, AJCC 6th Edition - Clinical: Stage IV - Signed by B[facility] Cancer, PA on 04/29/2011    INTERVAL HISTORY:  [name] 68 y.o. male returns for routine follow-up for diffuse large B cell lymphoma and iron deficiency anemia. Denies any nausea, vomiting, or diarrhea. Denies any new pains. Had not noticed any recent bleeding such as epistaxis, hematuria or hematochezia. Denies recent chest pain on exertion, shortness of breath on minimal exertion, pre-syncopal episodes, or palpitations. Denies any numbness or tingling in hands or feet. Denies any recent fevers, infections, or recent hospitalizations. Patient reports appetite at 75% and energy level at 75%.  He is eating well and maintaining his weight at this time.   REVIEW OF SYSTEMS:  Review of Systems  Constitutional: Positive for fatigue.  Respiratory: Positive for shortness of breath.   Cardiovascular: Positive for leg swelling.  Neurological: Positive for numbness.  All other systems reviewed and are negative.    PAST MEDICAL/SURGICAL HISTORY:  Past Medical History:  Diagnosis Date  . Asthma   . Diabetes mellitus without complication ([place])   . DLBCL (diffuse  large B cell lymphoma) ([place]) 02/28/2009  . Edema   . Heart failure, diastolic, chronic ([place])    patient denies  . Hyperlipidemia, mixed   . Hypertension   . IDA (iron deficiency anemia)   . Large cell lymphoma ([place]) 01/2005   autologous stem cell transplant 12/2005  . Obesity, morbid (more than 100 lbs over ideal weight or BMI > 40) (HCC)   . Venous insufficiency 04/29/2011    Past Surgical History:  Procedure Laterality Date  . BACK SURGERY  2006   T6 vertebrae removed/titanium placed  . COLONOSCOPY  11/24/2004   Polyps in the left colon ablated/removed as described above.  Two submucosal lesions consistent with lipomas as described above not  manipulated./ Normal rectum  . COLONOSCOPY  06/20/2012   MULTIPLE RECTAL AND COLONIC POLYPS  . COLONOSCOPY N/A 03/08/2015   RMR: Capacious, redundant colon. Multiple colonic and rectosigmoid polyps removed. ablated as described above. colonic lipoma abnormal appearing terminal ileum likely a variant of normal). Howeverwith history  biopsies obtained.   . ESOPHAGOGASTRODUODENOSCOPY N/A 03/08/2015   RMR: Hiatal hernia Polypoid gastric mucosa with multiple gastric polyps. largest polyp removed via snare polypectomgy hemostasis clip placed at base. Status post gastric biopsy. Status post video capsule placement.   Marland Kitchen GIVENS CAPSULE STUDY N/A 03/08/2015   Procedure: GIVENS CAPSULE STUDY;  Surgeon: Daneil Dolin, MD;  Location[facility];  Service: Endoscopy;  Laterality: N/A;  . LIMBAL STEM CELL TRANSPLANT    . LUNG BIOPSY  6/06  . MULTIPLE TOOTH EXTRACTIONS  04/2005  . port a cath placement    . PORT-A-CATH REMOVAL  09/08/2012   Procedure: REMOVAL PORT-A-CATH;  Surgeon: Melrose Nakayama, MD;  Location: [facility];  Service: Thoracic;  Laterality: N/A;     SOCIAL HISTORY:  Social History   Socioeconomic History  . Marital status: Married    Spouse name: Not on file  . Number of children: 2  . Years of education: Not on file  . Highest education level: Not on file  Occupational History  . Occupation: disability due to back    Employer: UNEMPLOYED  Social Needs  . Financial resource strain: Not on file  . Food insecurity:    Worry: Not on file    Inability: Not on file  . Transportation needs:    Medical: Not on file    Non-medical: Not on file  Tobacco Use  . Smoking status: Former Smoker    Packs/day: 0.50     Years: 15.00    Pack years: 7.50    Last attempt to quit: 11/11/2002    Years since quitting: 16.1  . Smokeless tobacco: Never Used  Substance and Sexual Activity  . Alcohol use: No    Alcohol/week: 0.0 standard drinks  . Drug use: No  . Sexual activity: Not on file  Lifestyle  . Physical activity:    Days per week: Not on file    Minutes per session: Not on file  . Stress: Not on file  Relationships  . Social connections:    Talks on phone: Not on file    Gets together: Not on file    Attends religious service: Not on file    Active member of club or organization: Not on file    Attends meetings of clubs or organizations: Not on file    Relationship status: Not on file  . Intimate partner violence:    Fear of current or ex partner: Not on file  Emotionally abused: Not on file    Physically abused: Not on file    Forced sexual activity: Not on file  Other Topics Concern  . Not on file  Social History Narrative   Married   No regular exercise    FAMILY HISTORY:  Family History  Problem Relation Age of Onset  . Cancer Mother        lung  . Cancer Father        prostate  . Colon cancer Neg Hx   . Diabetes Neg Hx     CURRENT MEDICATIONS:  Outpatient Encounter Medications as of 12/28/2018  Medication Sig  . apixaban (ELIQUIS) 5 MG TABS tablet Take 1 tablet (5 mg total) by mouth 2 (two) times daily.  . clotrimazole-betamethasone (LOTRISONE) cream APP EXT AA BID  . furosemide (LASIX) 20 MG tablet take 1 tablet by mouth once daily if needed  . glimepiride (AMARYL) 1 MG tablet take 1/2 tablet by mouth once daily WITH BREAKFAST (Patient taking differently: Take 1 mg by mouth daily with breakfast. take 1 tablet by mouth twice daily WITH BREAKFAST)  . metoprolol tartrate (LOPRESSOR) 25 MG tablet Take 25 mg by mouth 2 (two) times daily.  Marland Kitchen ipratropium-albuterol (DUONEB) 0.5-2.5 (3) MG/3ML SOLN Take 3 mLs by nebulization every 8 (eight) hours. (Patient not taking: Reported on  12/28/2018)  . metoprolol tartrate (LOPRESSOR) 25 MG tablet Take 1 tablet (25 mg total) by mouth 2 (two) times daily.   No facility-administered encounter medications on file as of 12/28/2018.     ALLERGIES:  Allergies  Allergen Reactions  . Penicillins Rash    Has patient had a PCN reaction causing immediate rash, facial/tongue/throat swelling, SOB or lightheadedness with hypotension: Unknown Has patient had a PCN reaction causing severe rash involving mucus membranes or skin necrosis: Unknown Has patient had a PCN reaction that required hospitalization: Unknown Has patient had a PCN reaction occurring within the last 10 years: No If all of the above answers are "NO", then may proceed with Cephalosporin use.      PHYSICAL EXAM:  ECOG Performance status: 1  Vitals:   12/28/18 0940  BP: 123/64  Pulse: 85  Resp: 20  Temp: 97.8 F (36.6 C)  SpO2: 99%   Filed Weights   12/28/18 0940  Weight: 293 lb (132.9 kg)    Physical Exam Constitutional:      Appearance: Normal appearance. He is normal weight.  Cardiovascular:     Rate and Rhythm: Normal rate and regular rhythm.     Heart sounds: Normal heart sounds.  Pulmonary:     Effort: Pulmonary effort is normal.     Breath sounds: Normal breath sounds.  Abdominal:     General: Bowel sounds are normal.     Palpations: Abdomen is soft.  Musculoskeletal: Normal range of motion.  Skin:    General: Skin is warm and dry.  Neurological:     Mental Status: He is alert and oriented to person, place, and time. Mental status is at baseline.  Psychiatric:        Mood and Affect: Mood normal.        Behavior: Behavior normal.        Thought Content: Thought content normal.        Judgment: Judgment normal.      LABORATORY DATA:  I have reviewed the labs as listed.  CBC    Component Value Date/Time   WBC 4.0 12/23/2018 1027   RBC 4.68 12/23/2018 1027  HGB 14.5 12/23/2018 1027   HCT 44.3 12/23/2018 1027   PLT 189 12/23/2018  1027   MCV 94.7 12/23/2018 1027   MCH 31.0 12/23/2018 1027   MCHC 32.7 12/23/2018 1027   RDW 14.3 12/23/2018 1027   LYMPHSABS 1.0 12/23/2018 1027   MONOABS 0.6 12/23/2018 1027   EOSABS 0.3 12/23/2018 1027   BASOSABS 0.0 12/23/2018 1027   CMP Latest Ref Rng & Units 12/23/2018 11/24/2017 09/20/2017  Glucose 70 - 99 mg/dL 117(H) 88 114(H)  BUN 8 - 23 mg/dL _0 Creatinine 0.61 - 1.24 mg/dL 1.09 1.12 0.91  Sodium 135 - 145 mmol/L 140 140 139  Potassium 3.5 - 5.1 mmol/L 4.1 4.2 4.3  Chloride 98 - 111 mmol/L 105 107 106  CO2 22 - 32 mmol/L _1 Calcium 8.9 - 10.3 mg/dL 9.2 9.3 8.7(L)  Total Protein 6.5 - 8.1 g/dL 7.2 7.2 6.2(L)  Total Bilirubin 0.3 - 1.2 mg/dL 0.6 0.6 0.5  Alkaline Phos 38 - 126 U/L 73 71 61  AST 15 - 41 U/L _2 ALT 0 - 44 U/L _3 I personally performed a face-to-face visit.  All questions were answered to patient's stated satisfaction. Encouraged patient to call with any new concerns or questions before his next visit to the cancer center and we can certain see him sooner, if needed.     ASSESSMENT & PLAN:   DLBCL (diffuse large B cell lymphoma) ([place]) 1.  DLBLC status post stem cell transplant: - Patient was diagnosed 06/2005. -She completed R-CHOP chemotherapy with incomplete response. -She went on to have ICE chemotherapy pre-transplant and total body radiation with autologous stem cell transplant on 01/15/2006. -He completed 2 years of maintenance Rituxan post transplant. -He has remained NED since that time. -He was released from the stem cell transplant clinic a few years ago. - Physical exam today was negative.  Patient denies any B symptoms. - We will continue to check labs every 6 months to ensure stability due to his thrombocytopenia and iron deficiency. - He can follow-up in 1 year with labs.  2.  Thrombocytopenia: - Patient was found to have thrombocytopenia on 11/24/2017 with his labs. -The initial platelet count was 70,000. -  Around that time he was hospitalized for pneumonia. - He was on antibiotics but no other new medications contributing to his low platelet count. -He denies any bright red bleeding or melena. -Labs on 12/23/2018 showed his platelet counts at 189. - We will continue to monitor.  3.  Iron deficiency anemia: - This is thought to be secondary to chronic GI blood loss. - His last dose of Feraheme was on 03/16/2016. - He had a colonoscopy and EGD in 02/2015 which revealed probable AVMs. -He does report fatigue but denies any bleeding. - He has tried oral iron supplements in the past and was unable to tolerate them due to side effects. - Labs on 12/23/2018 showed his ferritin 55, percent saturation at 16, his hemoglobin was 14.5. - He wishes to hold off on any IV iron at this time. - We will continue to follow him every 6 months with labs.      Orders placed this encounter:  Orders Placed This Encounter  Procedures  . Lactate dehydrogenase  . CBC with Differential/Platelet  . Comprehensive metabolic panel  . Ferritin  . Iron and TIBC  . Vitamin B12  . VITAMIN D 25 Hydroxy (Vit-D Deficiency, Fractures)  . Folate  .  Lactate dehydrogenase  . CBC with Differential/Platelet  . Comprehensive metabolic panel  . Ferritin  . Iron and TIBC  . VITAMIN D 25 Hydroxy (Vit-D Deficiency, Fractures)  . Vitamin B12  . Folate      Francene Finders, FNP-C [place] [phone]

## 2019-02-08 DIAGNOSIS — E119 Type 2 diabetes mellitus without complications: Secondary | ICD-10-CM | POA: Diagnosis not present

## 2019-03-09 DIAGNOSIS — H25813 Combined forms of age-related cataract, bilateral: Secondary | ICD-10-CM | POA: Diagnosis not present

## 2019-03-22 NOTE — H&P (Signed)
Surgical History & Physical  Patient Name: [name] DOB: [birth year]  Surgery: Cataract extraction with intraocular lens implant phacoemulsification; Right Eye  Surgeon: Baruch Goldmann MD Surgery Date:  03/31/2019 Pre-Op Date:  03/14/2019  HPI: A 32 Yr. old male patient referred by Dr. Jorja Loa for cataract evaluation 1. 1. The patient complains of difficulty when viewing TV, reading closed caption, news scrolls on TV. Both eyes are affected. The episode is gradual. The patient describes dull and hazy symptoms affecting their eyes/vision. The condition's severity decreased since last visit with Dr. Jorja Loa. Patient does note glare with sunlight. This is negatively affecting the patient's quality of life. Patient ready for cataract surgery for BCVA. HPI was performed by Baruch Goldmann .  Medical History: Cataracts Arthritis Cancer Diabetes Heart Problem High Blood Pressure Lung Problems Musculoskeletal Joint Ache, Stiffness Respiratory Shortness of Breath  Review of Systems All recorded systems are negative except as noted above.  Social   Former smoker   Medication Artifical Tears,  Eliquis, Metformin, Metoprolol, Patient unsure of meds,   Sx/Procedures  None  Drug Allergies  Penicillin,   History & Physical: Heent: Cataract, Right eye NECK: supple without bruits LUNGS: lungs clear to auscultation CV: regular rate and rhythm Abdomen: soft and non-tender  Impression & Plan: Assessment: 1.  COMBINED FORMS AGE RELATED CATARACT; Both Eyes (H25.813)  Plan: 1.  Cataract accounts for the patient's decreased vision. This visual impairment is not correctable with a tolerable change in glasses or contact lenses. Cataract surgery with an implantation of a new lens should significantly improve the visual and functional status of the patient. Discussed all risks, benefits, alternatives, and potential complications. Discussed the procedures and recovery. Patient desires to have  surgery. A-scan ordered and performed today for intra-ocular lens calculations. The surgery will be performed in order to improve vision for driving, reading, and for eye examinations. Recommend phacoemulsification with intra-ocular lens. Right Eye. Dilates well - shugarcaine by protocol.

## 2019-03-24 DIAGNOSIS — H25811 Combined forms of age-related cataract, right eye: Secondary | ICD-10-CM | POA: Diagnosis not present

## 2019-03-27 NOTE — Patient Instructions (Signed)
[name]  03/27/2019     @PREFPERIOPPHARMACY @   Your procedure is scheduled on 03/31/19.  Report to [facility] at 1000 A.M.  Call this number if you have problems the morning of surgery:  [phone]   Remember:  Do not eat or drink after midnight.      Take these medicines the morning of surgery with A SIP OF WATER Duoneb and Metoprolol    Do not wear jewelry, make-up or nail polish.  Do not wear lotions, powders, or perfumes, or deodorant.  Do not shave 48 hours prior to surgery.  Men may shave face and neck.  Do not bring valuables to the hospital.  [facility] is not responsible for any belongings or valuables.  Contacts, dentures or bridgework may not be worn into surgery.  Leave your suitcase in the car.  After surgery it may be brought to your room.  For patients admitted to the hospital, discharge time will be determined by your treatment team.  Patients discharged the day of surgery will not be allowed to drive home.    Please read over the following fact sheets that you were given. Anesthesia Post-op Instructions     PATIENT INSTRUCTIONS POST-ANESTHESIA  IMMEDIATELY FOLLOWING SURGERY:  Do not drive or operate machinery for the first twenty four hours after surgery.  Do not make any important decisions for twenty four hours after surgery or while taking narcotic pain medications or sedatives.  If you develop intractable nausea and vomiting or a severe headache please notify your doctor immediately.  FOLLOW-UP:  Please make an appointment with your surgeon as instructed. You do not need to follow up with anesthesia unless specifically instructed to do so.  WOUND CARE INSTRUCTIONS (if applicable):  Keep a dry clean dressing on the anesthesia/puncture wound site if there is drainage.  Once the wound has quit draining you may leave it open to air.  Generally you should leave the bandage intact for twenty four hours unless there is drainage.  If the epidural site  drains for more than 36-48 hours please call the anesthesia department.  QUESTIONS?:  Please feel free to call your physician or the hospital operator if you have any questions, and they will be happy to assist you.      Cataract Surgery Cataract surgery is a procedure to remove a cloudy lens (cataract) in the eye. The lens focuses light inside the eye. When a lens becomes cloudy, your vision is affected. Another lens (intraocular lens or IOL) is usually inserted to replace the cloudy lens and to properly focus light in the eye. Cataract surgery is usually recommended when the cataract is causing problems with daily functioning, such as reading, watching television, or driving. Tell a health care provider about:  Any allergies you have.  All medicines you are taking, including vitamins, herbs, eye drops, creams, and over-the-counter medicines.  Any problems you or family members have had with anesthetic medicines.  Any blood disorders you have.  Any surgeries you have had, especially eye surgeries that include refractive surgery, such as PRK and LASIK.  Any medical conditions you have.  Whether you are pregnant or may be pregnant. What are the risks? Generally, this is a safe procedure. However, problems may occur, including:  Infection.  Bleeding.  High pressure in the eye (glaucoma).  Detachment of the retina.  Corneal swelling.  Allergic reactions to medicines.  Damage to other structures or organs.  Inflammation of the eye.  Clouding  of the part of your eye that holds an IOL in place (after-cataract). This is common and can be treated at a later date with laser surgery.  An IOL moving out of position (rare).  Loss of vision (rare). What happens before the procedure? Staying hydrated Follow instructions from your health care provider about hydration, which may include:  Up to 2 to 6 hours before the procedure - you may continue to drink clear liquids, such as  water, clear fruit juice, black coffee, and plain tea. Exact instructions will be given by your health care provider.  Eating and drinking restrictions Follow instructions from your health care provider about eating and drinking, which may include:  8 hours before the procedure - stop eating heavy meals or foods, such as meat, fried foods, or fatty foods.  6-8 hours before the procedure - stop eating light meals or foods, such as toast or cereal.  6-8 hours before the procedure - stop drinking milk or drinks that contain milk.  2-6 hours before the procedure - stop drinking clear liquids. Medicines Ask your health care provider about:  Changing or stopping your regular medicines. This is especially important if you are taking diabetes medicines or blood thinners.  Taking medicines such as aspirin and ibuprofen. These medicines can thin your blood. Do not take these medicines unless your health care provider tells you to take them.  Taking over-the-counter medicines, vitamins, herbs, and supplements. General instructions  Do not put contact lenses in either eye on the day of your surgery.  Plan to have someone take you home from the hospital or clinic.  If you will be going home right after the procedure, plan to have someone with you for 24 hours.  Ask your health care provider what steps will be taken to help prevent infection. These may include: ? Washing skin with a germ-killing soap. ? Taking antibiotic medicine. What happens during the procedure?      An IV may be inserted into one of your veins.  You will be given one or both of the following: ? A medicine to help you relax (sedative). ? A medicine to numb the area (local anesthetic). This may be numbing eye drops or an injection that is given behind the eye.  A small cut (incision) will be made to the edge of the clear surface that covers the front of the eye (cornea).  A small probe will be inserted into the eye.  This device gives off ultrasound waves that soften and break up the cloudy center of the lens. This makes it easier for the cataract to be removed.  The cataract will be removed by suction.  An IOL may be implanted.  Part of the capsule that surrounds the lens will be left in the eye to support the IOL.  Your surgeon may use stitches (sutures) to close the incision. The procedure may vary among health care providers and hospitals. What happens after the procedure?  Your blood pressure, heart rate, breathing rate, and blood oxygen level will be monitored until you leave the hospital or clinic.  You may be given a protective shield to wear over your eye.  You may be given medicines to relieve discomfort and swelling. Some medicines may be in the form of eye drops.  Do not drive for 24 hours if you were given a sedative during your procedure. Summary  Cataract surgery is a procedure to remove a cloudy lens (cataract) in the eye.  This is  a safe procedure. However, problems may occur, including infection, bleeding, glaucoma, inflammation, and loss of vision.  Follow your health care provider's instructions about when to stop eating and drinking and whether to change or stop certain medicines.  After the procedure, you may be given medicines or an eye shield to wear over your eye.  Do not drive for 24 hours if you were given a sedative during your procedure. This information is not intended to replace advice given to you by your health care provider. Make sure you discuss any questions you have with your health care provider. Document Released: 07/30/2011 Document Revised: 02/07/2018 Document Reviewed: 02/07/2018 Elsevier Patient Education  2020 [employer].

## 2019-03-29 ENCOUNTER — Other Ambulatory Visit: Payer: Self-pay

## 2019-03-29 ENCOUNTER — Encounter (HOSPITAL_COMMUNITY)
Admission: RE | Admit: 2019-03-29 | Discharge: 2019-03-29 | Disposition: A | Discharge status: Home / Self Care | Payer: Medicare Other | Source: Ambulatory Visit | Attending: Ophthalmology | Admitting: Ophthalmology

## 2019-03-29 ENCOUNTER — Other Ambulatory Visit (HOSPITAL_COMMUNITY)
Admission: RE | Admit: 2019-03-29 | Discharge: 2019-03-29 | Disposition: A | Discharge status: Home / Self Care | Payer: Medicare Other | Source: Ambulatory Visit | Attending: Ophthalmology | Admitting: Ophthalmology

## 2019-03-29 ENCOUNTER — Encounter (HOSPITAL_COMMUNITY): Payer: Self-pay

## 2019-03-29 DIAGNOSIS — E119 Type 2 diabetes mellitus without complications: Secondary | ICD-10-CM | POA: Diagnosis not present

## 2019-03-29 DIAGNOSIS — I10 Essential (primary) hypertension: Secondary | ICD-10-CM | POA: Diagnosis not present

## 2019-03-29 DIAGNOSIS — Z7901 Long term (current) use of anticoagulants: Secondary | ICD-10-CM | POA: Diagnosis not present

## 2019-03-29 DIAGNOSIS — H25811 Combined forms of age-related cataract, right eye: Secondary | ICD-10-CM | POA: Diagnosis not present

## 2019-03-29 DIAGNOSIS — Z87891 Personal history of nicotine dependence: Secondary | ICD-10-CM | POA: Diagnosis not present

## 2019-03-29 DIAGNOSIS — E1136 Type 2 diabetes mellitus with diabetic cataract: Secondary | ICD-10-CM | POA: Diagnosis not present

## 2019-03-29 DIAGNOSIS — Z20828 Contact with and (suspected) exposure to other viral communicable diseases: Secondary | ICD-10-CM | POA: Diagnosis not present

## 2019-03-29 DIAGNOSIS — Z880 Allergy status to penicillin: Secondary | ICD-10-CM | POA: Diagnosis not present

## 2019-03-29 DIAGNOSIS — J45909 Unspecified asthma, uncomplicated: Secondary | ICD-10-CM | POA: Diagnosis not present

## 2019-03-29 DIAGNOSIS — Z7984 Long term (current) use of oral hypoglycemic drugs: Secondary | ICD-10-CM | POA: Diagnosis not present

## 2019-03-29 DIAGNOSIS — M1990 Unspecified osteoarthritis, unspecified site: Secondary | ICD-10-CM | POA: Diagnosis not present

## 2019-03-29 DIAGNOSIS — Z6841 Body Mass Index (BMI) 40.0 and over, adult: Secondary | ICD-10-CM | POA: Diagnosis not present

## 2019-03-29 DIAGNOSIS — Z79899 Other long term (current) drug therapy: Secondary | ICD-10-CM | POA: Diagnosis not present

## 2019-03-29 LAB — BASIC METABOLIC PANEL
Anion gap: 9 (ref 5–15)
BUN: 9 mg/dL (ref 8–23)
CO2: 23 mmol/L (ref 22–32)
Calcium: 8.8 mg/dL — ABNORMAL LOW (ref 8.9–10.3)
Chloride: 107 mmol/L (ref 98–111)
Creatinine, Ser: 1.05 mg/dL (ref 0.61–1.24)
GFR calc Af Amer: >60 mL/min (ref >60)
GFR calc non Af Amer: >60 mL/min (ref >60)
Glucose, Bld: 111 mg/dL — ABNORMAL HIGH (ref 70–99)
Potassium: 3.7 mmol/L (ref 3.5–5.1)
Sodium: 139 mmol/L (ref 135–145)

## 2019-03-29 LAB — SARS CORONAVIRUS 2 (TAT 6-24 HRS): SARS Coronavirus 2: NEGATIVE

## 2019-03-29 LAB — HEMOGLOBIN A1C
Hgb A1c MFr Bld: 4.9 % (ref 4.8–5.6)
Mean Plasma Glucose: 93.93 mg/dL

## 2019-03-29 LAB — GLUCOSE, CAPILLARY: Glucose-Capillary: 100 mg/dL — ABNORMAL HIGH (ref 70–99)

## 2019-03-29 NOTE — Progress Notes (Signed)
   03/29/19 1004  OBSTRUCTIVE SLEEP APNEA  Have you ever been diagnosed with sleep apnea through a sleep study? No  Do you snore loudly (loud enough to be heard through closed doors)?  0  Do you often feel tired, fatigued, or sleepy during the daytime (such as falling asleep during driving or talking to someone)? 0  Has anyone observed you stop breathing during your sleep? 0  Do you have, or are you being treated for high blood pressure? 1  BMI more than 35 kg/m2? 1  Age > 50 (1-yes) 1  Neck circumference greater than:Male 16 inches or larger, Male 17inches or larger? 0  Male Gender (Yes=1) 1  Obstructive Sleep Apnea Score 4  Score 5 or greater  Results sent to PCP

## 2019-03-31 ENCOUNTER — Ambulatory Visit (HOSPITAL_COMMUNITY)
Admission: RE | Admit: 2019-03-31 | Discharge: 2019-03-31 | Disposition: A | Discharge status: Home / Self Care | Payer: Medicare Other | Attending: Ophthalmology | Admitting: Ophthalmology

## 2019-03-31 ENCOUNTER — Ambulatory Visit (HOSPITAL_COMMUNITY): Payer: Medicare Other | Admitting: Anesthesiology

## 2019-03-31 ENCOUNTER — Encounter (HOSPITAL_COMMUNITY)
Admission: RE | Disposition: A | Discharge status: Home / Self Care | Payer: Self-pay | Source: Home / Self Care | Attending: Ophthalmology

## 2019-03-31 DIAGNOSIS — M1990 Unspecified osteoarthritis, unspecified site: Secondary | ICD-10-CM | POA: Insufficient documentation

## 2019-03-31 DIAGNOSIS — Z7984 Long term (current) use of oral hypoglycemic drugs: Secondary | ICD-10-CM | POA: Diagnosis not present

## 2019-03-31 DIAGNOSIS — Z7901 Long term (current) use of anticoagulants: Secondary | ICD-10-CM | POA: Insufficient documentation

## 2019-03-31 DIAGNOSIS — J45909 Unspecified asthma, uncomplicated: Secondary | ICD-10-CM | POA: Diagnosis not present

## 2019-03-31 DIAGNOSIS — Z880 Allergy status to penicillin: Secondary | ICD-10-CM | POA: Diagnosis not present

## 2019-03-31 DIAGNOSIS — Z6841 Body Mass Index (BMI) 40.0 and over, adult: Secondary | ICD-10-CM | POA: Insufficient documentation

## 2019-03-31 DIAGNOSIS — Z20828 Contact with and (suspected) exposure to other viral communicable diseases: Secondary | ICD-10-CM | POA: Insufficient documentation

## 2019-03-31 DIAGNOSIS — Z87891 Personal history of nicotine dependence: Secondary | ICD-10-CM | POA: Insufficient documentation

## 2019-03-31 DIAGNOSIS — H25811 Combined forms of age-related cataract, right eye: Secondary | ICD-10-CM | POA: Diagnosis not present

## 2019-03-31 DIAGNOSIS — E1136 Type 2 diabetes mellitus with diabetic cataract: Secondary | ICD-10-CM | POA: Diagnosis not present

## 2019-03-31 DIAGNOSIS — Z79899 Other long term (current) drug therapy: Secondary | ICD-10-CM | POA: Insufficient documentation

## 2019-03-31 DIAGNOSIS — I10 Essential (primary) hypertension: Secondary | ICD-10-CM | POA: Diagnosis not present

## 2019-03-31 HISTORY — PX: CATARACT EXTRACTION W/PHACO: SHX586

## 2019-03-31 LAB — GLUCOSE, CAPILLARY: Glucose-Capillary: 97 mg/dL (ref 70–99)

## 2019-03-31 SURGERY — PHACOEMULSIFICATION, CATARACT, WITH IOL INSERTION
Anesthesia: Monitor Anesthesia Care | Site: Eye | Laterality: Right

## 2019-03-31 MED ORDER — LIDOCAINE HCL 3.5 % OP GEL
1.0000 "application " | Freq: Once | OPHTHALMIC | Status: AC
  Administered 2019-03-31: 1 via OPHTHALMIC

## 2019-03-31 MED ORDER — BSS IO SOLN
INTRAOCULAR | Status: DC | PRN
  Administered 2019-03-31: 15 mL via INTRAOCULAR

## 2019-03-31 MED ORDER — POVIDONE-IODINE 5 % OP SOLN
OPHTHALMIC | Status: DC | PRN
  Administered 2019-03-31: 1 via OPHTHALMIC

## 2019-03-31 MED ORDER — MIDAZOLAM HCL 2 MG/2ML IJ SOLN
INTRAMUSCULAR | Status: DC | PRN
  Administered 2019-03-31: 2 mg via INTRAVENOUS

## 2019-03-31 MED ORDER — PHENYLEPHRINE HCL 2.5 % OP SOLN
1.0000 [drp] | OPHTHALMIC | Status: AC | PRN
  Administered 2019-03-31 (×3): 1 [drp] via OPHTHALMIC

## 2019-03-31 MED ORDER — LIDOCAINE HCL (PF) 1 % IJ SOLN
INTRAOCULAR | Status: DC | PRN
  Administered 2019-03-31: 11:00:00 1 mL via OPHTHALMIC

## 2019-03-31 MED ORDER — SODIUM CHLORIDE 0.9% FLUSH
10.0000 mL | INTRAVENOUS | Status: DC | PRN
  Administered 2019-03-31: 11:00:00 5 mL via INTRAVENOUS

## 2019-03-31 MED ORDER — PROVISC 10 MG/ML IO SOLN
INTRAOCULAR | Status: DC | PRN
  Administered 2019-03-31: 0.85 mL via INTRAOCULAR

## 2019-03-31 MED ORDER — CYCLOPENTOLATE-PHENYLEPHRINE 0.2-1 % OP SOLN
1.0000 [drp] | OPHTHALMIC | Status: AC | PRN
  Administered 2019-03-31 (×3): 1 [drp] via OPHTHALMIC

## 2019-03-31 MED ORDER — SODIUM HYALURONATE 23 MG/ML IO SOLN
INTRAOCULAR | Status: DC | PRN
  Administered 2019-03-31: 0.6 mL via INTRAOCULAR

## 2019-03-31 MED ORDER — TETRACAINE HCL 0.5 % OP SOLN
1.0000 [drp] | OPHTHALMIC | Status: AC | PRN
  Administered 2019-03-31 (×3): 1 [drp] via OPHTHALMIC

## 2019-03-31 MED ORDER — EPINEPHRINE PF 1 MG/ML IJ SOLN
INTRAOCULAR | Status: DC | PRN
  Administered 2019-03-31: 11:00:00 500 mL

## 2019-03-31 MED ORDER — NEOMYCIN-POLYMYXIN-DEXAMETH 3.5-10000-0.1 OP SUSP
OPHTHALMIC | Status: DC | PRN
  Administered 2019-03-31: 2 [drp] via OPHTHALMIC

## 2019-03-31 MED ORDER — MIDAZOLAM HCL 2 MG/2ML IJ SOLN
INTRAMUSCULAR | Status: AC
  Filled 2019-03-31: qty 2

## 2019-03-31 SURGICAL SUPPLY — 16 items
CLOTH BEACON ORANGE TIMEOUT ST (SAFETY) ×2 IMPLANT
DEVICE MILOOP (MISCELLANEOUS) IMPLANT
EYE SHIELD UNIVERSAL CLEAR (GAUZE/BANDAGES/DRESSINGS) ×2 IMPLANT
GLOVE BIOGEL PI IND STRL 7.0 (GLOVE) IMPLANT
GLOVE BIOGEL PI INDICATOR 7.0 (GLOVE) ×4
LENS ALC ACRYL/TECN (Ophthalmic Related) ×2 IMPLANT
MILOOP DEVICE (MISCELLANEOUS)
NDL HYPO 18GX1.5 BLUNT FILL (NEEDLE) IMPLANT
NEEDLE HYPO 18GX1.5 BLUNT FILL (NEEDLE) ×3 IMPLANT
PAD ARMBOARD 7.5X6 YLW CONV (MISCELLANEOUS) ×2 IMPLANT
RING MALYGIN (MISCELLANEOUS) IMPLANT
SYR TB 1ML LL NO SAFETY (SYRINGE) ×2 IMPLANT
TAPE SURG TRANSPORE 1 IN (GAUZE/BANDAGES/DRESSINGS) IMPLANT
TAPE SURGICAL TRANSPORE 1 IN (GAUZE/BANDAGES/DRESSINGS) ×2
VISCOELASTIC ADDITIONAL (OPHTHALMIC RELATED) ×2 IMPLANT
WATER STERILE IRR 250ML POUR (IV SOLUTION) ×2 IMPLANT

## 2019-03-31 NOTE — Op Note (Signed)
Date of procedure: 03/31/19  Pre-operative diagnosis: Visually significant age-related cataract, Right Eye (H25.811)  Post-operative diagnosis: Visually significant age-related cataract, Right Eye  Procedure: Removal of cataract via phacoemulsification and insertion of intra-ocular lens [employer] PCB00  +18.0D into the capsular bag of the Right Eye  Attending surgeon: Gerda Diss. Arshia Rondon, MD, MA  Anesthesia: MAC, Topical Akten  Complications: None  Estimated Blood Loss: <35m (minimal)  Specimens: None  Implants: As above  Indications:  Visually significant age-related cataract, Right Eye  Procedure:  The patient was seen and identified in the pre-operative area. The operative eye was identified and dilated.  The operative eye was marked.  Topical anesthesia was administered to the operative eye.     The patient was then to the operative suite and placed in the supine position.  A timeout was performed confirming the patient, procedure to be performed, and all other relevant information.   The patient's face was prepped and draped in the usual fashion for intra-ocular surgery.  A lid speculum was placed into the operative eye and the surgical microscope moved into place and focused.  A superotemporal paracentesis was created using a 20 gauge paracentesis blade.  Shugarcaine was injected into the anterior chamber.  Viscoelastic was injected into the anterior chamber.  A temporal clear-corneal main wound incision was created using a 2.426mmicrokeratome.  A continuous curvilinear capsulorrhexis was initiated using an irrigating cystitome and completed using capsulorrhexis forceps.  Hydrodissection and hydrodeliniation were performed.  Viscoelastic was injected into the anterior chamber.  A phacoemulsification handpiece and a chopper as a second instrument were used to remove the nucleus and epinucleus. The irrigation/aspiration handpiece was used to remove any remaining cortical  material.   The capsular bag was reinflated with viscoelastic, checked, and found to be intact.  The intraocular lens was inserted into the capsular bag and dialed into place using a Kuglen hook.  The irrigation/aspiration handpiece was used to remove any remaining viscoelastic.  The clear corneal wound and paracentesis wounds were then hydrated and checked with Weck-Cels to be watertight.  The lid-speculum and drape was removed, and the patient's face was cleaned with a wet and dry 4x4.  Maxitrol was instilled in the eye before a clear shield was taped over the eye. The patient was taken to the post-operative care unit in good condition, having tolerated the procedure well.  Post-Op Instructions: [facility] and will receive all other orders and instructions.

## 2019-03-31 NOTE — Transfer of Care (Signed)
Immediate Anesthesia Transfer of Care Note  Patient: [name]  Procedure(s) Performed: CATARACT EXTRACTION PHACO AND INTRAOCULAR LENS PLACEMENT (IOC) (Right Eye)  Patient Location: Short Stay  Anesthesia Type:MAC  Level of Consciousness: awake, alert  and patient cooperative  Airway & Oxygen Therapy: Patient Spontanous Breathing  Post-op Assessment: Report given to RN and Post -op Vital signs reviewed and stable  Post vital signs: Reviewed and stable  Last Vitals:  Vitals Value Taken Time  BP 139/79   Temp 98.1   Pulse 61   Resp 18   SpO2 96     Last Pain:  Vitals:   03/31/19 1033  TempSrc: Oral  PainSc: 0-No pain      Patients Stated Pain Goal: 5 (87/68/11 5726)  Complications: No apparent anesthesia complications

## 2019-03-31 NOTE — Anesthesia Postprocedure Evaluation (Signed)
Anesthesia Post Note  Patient: [name]  Procedure(s) Performed: CATARACT EXTRACTION PHACO AND INTRAOCULAR LENS PLACEMENT (IOC) (Right Eye)  Patient location during evaluation: Short Stay Anesthesia Type: MAC Level of consciousness: awake and alert and patient cooperative Pain management: pain level controlled Vital Signs Assessment: post-procedure vital signs reviewed and stable Respiratory status: spontaneous breathing Cardiovascular status: stable Postop Assessment: no apparent nausea or vomiting Anesthetic complications: no     Last Vitals:  Vitals:   03/31/19 1033 03/31/19 1131  BP: 136/80 139/79  Pulse: 62 63  Resp: 18 16  Temp: 37.1 C 36.7 C  SpO2: 99% 98%    Last Pain:  Vitals:   03/31/19 1131  TempSrc: Oral  PainSc: 0-No pain                 Deyona Soza

## 2019-03-31 NOTE — Discharge Instructions (Addendum)
Monitored Anesthesia Care, Care After These instructions provide you with information about caring for yourself after your procedure. Your health care provider may also give you more specific instructions. Your treatment has been planned according to current medical practices, but problems sometimes occur. Call your health care provider if you have any problems or questions after your procedure. What can I expect after the procedure? After your procedure, you may:  Feel sleepy for several hours.  Feel clumsy and have poor balance for several hours.  Feel forgetful about what happened after the procedure.  Have poor judgment for several hours.  Feel nauseous or vomit.  Have a sore throat if you had a breathing tube during the procedure. Follow these instructions at home: For at least 24 hours after the procedure:      Have a responsible adult stay with you. It is important to have someone help care for you until you are awake and alert.  Rest as needed.  Do not: ? Participate in activities in which you could fall or become injured. ? Drive. ? Use heavy machinery. ? Drink alcohol. ? Take sleeping pills or medicines that cause drowsiness. ? Make important decisions or sign legal documents. ? Take care of children on your own. Eating and drinking  Follow the diet that is recommended by your health care provider.  If you vomit, drink water, juice, or soup when you can drink without vomiting.  Make sure you have little or no nausea before eating solid foods. General instructions  Take over-the-counter and prescription medicines only as told by your health care provider.  If you have sleep apnea, surgery and certain medicines can increase your risk for breathing problems. Follow instructions from your health care provider about wearing your sleep device: ? Anytime you are sleeping, including during daytime naps. ? While taking prescription pain medicines, sleeping medicines,  or medicines that make you drowsy.  If you smoke, do not smoke without supervision.  Keep all follow-up visits as told by your health care provider. This is important. Contact a health care provider if:  You keep feeling nauseous or you keep vomiting.  You feel light-headed.  You develop a rash.  You have a fever. Get help right away if:  You have trouble breathing. Summary  For several hours after your procedure, you may feel sleepy and have poor judgment.  Have a responsible adult stay with you for at least 24 hours or until you are awake and alert. This information is not intended to replace advice given to you by your health care provider. Make sure you discuss any questions you have with your health care provider. Document Released: 12/01/2015 Document Revised: 11/08/2017 Document Reviewed: 12/01/2015 Elsevier Patient Education  2020 [place]. Please discharge patient when stable, will follow up today with Dr. Marisa Hua at the [facility] office immediately following discharge.  Leave shield in place until visit.  All paperwork with discharge instructions will be given at the office.

## 2019-03-31 NOTE — Anesthesia Procedure Notes (Signed)
Procedure Name: MAC Date/Time: 03/31/2019 11:00 AM Performed by: Vista Deck, CRNA Pre-anesthesia Checklist: Patient identified, Emergency Drugs available, Suction available, Timeout performed and Patient being monitored Patient Re-evaluated:Patient Re-evaluated prior to induction Oxygen Delivery Method: Nasal Cannula

## 2019-03-31 NOTE — Anesthesia Preprocedure Evaluation (Addendum)
Anesthesia Evaluation  Patient identified by MRN, date of birth, ID band Patient awake    Reviewed: Allergy & Precautions, NPO status , Patient's Chart, lab work & pertinent test results  Airway Mallampati: III  TM Distance: >3 FB Neck ROM: Full    Dental  (+) Upper Dentures, Lower Dentures,    Pulmonary asthma , pneumonia (2 years ago), resolved, former smoker,    Pulmonary exam normal        Cardiovascular Exercise Tolerance: Poor hypertension, Pt. on medications Normal cardiovascular exam     Neuro/Psych    GI/Hepatic negative GI ROS, Neg liver ROS,   Endo/Other  diabetes, Well Controlled, Type 2Morbid obesity  Renal/GU Renal disease     Musculoskeletal   Abdominal   Peds  Hematology  (+) anemia ,   Anesthesia Other Findings   Reproductive/Obstetrics                           Anesthesia Physical Anesthesia Plan  ASA: III  Anesthesia Plan: MAC   Post-op Pain Management:    Induction:   PONV Risk Score and Plan:   Airway Management Planned: Nasal Cannula and Natural Airway  Additional Equipment:   Intra-op Plan:   Post-operative Plan:   Informed Consent: I have reviewed the patients History and Physical, chart, labs and discussed the procedure including the risks, benefits and alternatives for the proposed anesthesia with the patient or authorized representative who has indicated his/her understanding and acceptance.     Dental advisory given  Plan Discussed with:   Anesthesia Plan Comments:        Anesthesia Quick Evaluation

## 2019-03-31 NOTE — Interval H&P Note (Signed)
History and Physical Interval Note: The H and P was reviewed and updated. The patient was examined.  No changes were found after exam.  The surgical eye was marked.  03/31/2019 10:58 AM  [name]  has presented today for surgery, with the diagnosis of nuclear cataract right eye.  The various methods of treatment have been discussed with the patient and family. After consideration of risks, benefits and other options for treatment, the patient has consented to  Procedure(s) with comments: CATARACT EXTRACTION PHACO AND INTRAOCULAR LENS PLACEMENT (IOC) (Right) - right as a surgical intervention.  The patient's history has been reviewed, patient examined, no change in status, stable for surgery.  I have reviewed the patient's chart and labs.  Questions were answered to the patient's satisfaction.     [name]

## 2019-04-03 ENCOUNTER — Encounter (HOSPITAL_COMMUNITY): Payer: Self-pay | Admitting: Ophthalmology

## 2019-04-07 DIAGNOSIS — H25812 Combined forms of age-related cataract, left eye: Secondary | ICD-10-CM | POA: Diagnosis not present

## 2019-04-07 NOTE — H&P (Signed)
Surgical History & Physical  Patient Name: [name] DOB: 1951/01/14  Surgery: Cataract extraction with intraocular lens implant phacoemulsification; Left Eye  Surgeon: Baruch Goldmann MD Surgery Date:  04/14/2019 Pre-Op Date:  04/06/2019  HPI: A 77 Yr. old male patient 1. The patient is returning after cataract surgery. The right eye is affected. Status post cataract surgery: Onset was S/P 1 week. Since the last visit, the affected area feels improvement; Pt says he is very happy due to how much more clear everything looks in OD. Patient is following medication instructions; TID OD. Pt complains that if he closes his OD , OS VA is very blurry, he is not able to read small prints or drive comfortably bc of OS VA. This This is negatively affecting the patient's quality of life. Pt BS Friday was 100. HPI was performed by Baruch Goldmann .  Medical History: Cataracts Arthritis Cancer Diabetes Heart Problem High Blood Pressure Lung Problems  Review of Systems Musculoskeletal Joint Ache, Stiffness Respiratory Shortness of Breath All recorded systems are negative except as noted above.  Social   Former smoker   Medication Artifical Tears, Ilevro, Moxifloxacin, Prednisolone acetate 1%,  Eliquis, Metformin, Metoprolol, Patient unsure of meds,   Sx/Procedures Phaco c IOL,   Drug Allergies  Penicillin,   History & Physical: Heent:  Cataract, Left eye NECK: supple without bruits LUNGS: lungs clear to auscultation CV: regular rate and rhythm Abdomen: soft and non-tender  Impression & Plan: Assessment: 1.  COMBINED FORMS AGE RELATED CATARACT; Left Eye (H25.812) 2.  CATARACT EXTRACTION STATUS; Right Eye (Z98.41) 3.  Hyperopia ; Right Eye (H52.01)  Plan: 1.  Cataract accounts for the patient's decreased vision. This visual impairment is not correctable with a tolerable change in glasses or contact lenses. Cataract surgery with an implantation of a new lens should significantly  improve the visual and functional status of the patient. Discussed all risks, benefits, alternatives, and potential complications. Discussed the procedures and recovery. Patient desires to have surgery. A-scan ordered and performed today for intra-ocular lens calculations. The surgery will be performed in order to improve vision for driving, reading, and for eye examinations. Recommend phacoemulsification with intra-ocular lens. Left Eye. Surgery required to correct imbalance of vision. Dilates well - shugarcaine by protocol. 2.  1 week after cataract surgery. Doing well with improved vision and normal eye pressure. Call with any problems or concerns. Stop Vigamox. Continue Ilevro 1 drop 1x/day for 3 more weeks. Continue Pred Acetate 1 drop 2x/day for 3 more weeks.

## 2019-04-11 ENCOUNTER — Encounter (HOSPITAL_COMMUNITY)
Admission: RE | Admit: 2019-04-11 | Discharge: 2019-04-11 | Disposition: A | Discharge status: Home / Self Care | Payer: Medicare Other | Source: Ambulatory Visit | Attending: Ophthalmology | Admitting: Ophthalmology

## 2019-04-11 ENCOUNTER — Other Ambulatory Visit: Payer: Self-pay

## 2019-04-12 ENCOUNTER — Other Ambulatory Visit (HOSPITAL_COMMUNITY)
Admission: RE | Admit: 2019-04-12 | Discharge: 2019-04-12 | Disposition: A | Discharge status: Home / Self Care | Payer: Medicare Other | Source: Ambulatory Visit | Attending: Ophthalmology | Admitting: Ophthalmology

## 2019-04-12 DIAGNOSIS — Z20828 Contact with and (suspected) exposure to other viral communicable diseases: Secondary | ICD-10-CM | POA: Insufficient documentation

## 2019-04-12 DIAGNOSIS — Z01812 Encounter for preprocedural laboratory examination: Secondary | ICD-10-CM | POA: Diagnosis not present

## 2019-04-12 LAB — SARS CORONAVIRUS 2 (TAT 6-24 HRS): SARS Coronavirus 2: NEGATIVE

## 2019-04-14 ENCOUNTER — Ambulatory Visit (HOSPITAL_COMMUNITY): Payer: Medicare Other | Admitting: Anesthesiology

## 2019-04-14 ENCOUNTER — Encounter (HOSPITAL_COMMUNITY)
Admission: RE | Disposition: A | Discharge status: Home / Self Care | Payer: Self-pay | Source: Home / Self Care | Attending: Ophthalmology

## 2019-04-14 ENCOUNTER — Other Ambulatory Visit: Payer: Self-pay

## 2019-04-14 ENCOUNTER — Encounter (HOSPITAL_COMMUNITY): Payer: Self-pay | Admitting: *Deleted

## 2019-04-14 ENCOUNTER — Ambulatory Visit (HOSPITAL_COMMUNITY)
Admission: RE | Admit: 2019-04-14 | Discharge: 2019-04-14 | Disposition: A | Discharge status: Home / Self Care | Payer: Medicare Other | Attending: Ophthalmology | Admitting: Ophthalmology

## 2019-04-14 DIAGNOSIS — Z7901 Long term (current) use of anticoagulants: Secondary | ICD-10-CM | POA: Insufficient documentation

## 2019-04-14 DIAGNOSIS — H25812 Combined forms of age-related cataract, left eye: Secondary | ICD-10-CM | POA: Insufficient documentation

## 2019-04-14 DIAGNOSIS — Z87891 Personal history of nicotine dependence: Secondary | ICD-10-CM | POA: Insufficient documentation

## 2019-04-14 DIAGNOSIS — J45909 Unspecified asthma, uncomplicated: Secondary | ICD-10-CM | POA: Insufficient documentation

## 2019-04-14 DIAGNOSIS — Z9841 Cataract extraction status, right eye: Secondary | ICD-10-CM | POA: Insufficient documentation

## 2019-04-14 DIAGNOSIS — I4891 Unspecified atrial fibrillation: Secondary | ICD-10-CM | POA: Diagnosis not present

## 2019-04-14 DIAGNOSIS — H5201 Hypermetropia, right eye: Secondary | ICD-10-CM | POA: Diagnosis not present

## 2019-04-14 DIAGNOSIS — Z880 Allergy status to penicillin: Secondary | ICD-10-CM | POA: Diagnosis not present

## 2019-04-14 DIAGNOSIS — E1136 Type 2 diabetes mellitus with diabetic cataract: Secondary | ICD-10-CM | POA: Diagnosis not present

## 2019-04-14 DIAGNOSIS — Z79899 Other long term (current) drug therapy: Secondary | ICD-10-CM | POA: Insufficient documentation

## 2019-04-14 DIAGNOSIS — M1990 Unspecified osteoarthritis, unspecified site: Secondary | ICD-10-CM | POA: Diagnosis not present

## 2019-04-14 DIAGNOSIS — Z7984 Long term (current) use of oral hypoglycemic drugs: Secondary | ICD-10-CM | POA: Insufficient documentation

## 2019-04-14 DIAGNOSIS — I509 Heart failure, unspecified: Secondary | ICD-10-CM | POA: Insufficient documentation

## 2019-04-14 DIAGNOSIS — I110 Hypertensive heart disease with heart failure: Secondary | ICD-10-CM | POA: Insufficient documentation

## 2019-04-14 HISTORY — PX: CATARACT EXTRACTION W/PHACO: SHX586

## 2019-04-14 LAB — GLUCOSE, CAPILLARY: Glucose-Capillary: 120 mg/dL — ABNORMAL HIGH (ref 70–99)

## 2019-04-14 SURGERY — PHACOEMULSIFICATION, CATARACT, WITH IOL INSERTION
Anesthesia: Monitor Anesthesia Care | Site: Eye | Laterality: Left

## 2019-04-14 MED ORDER — PHENYLEPHRINE HCL 2.5 % OP SOLN
1.0000 [drp] | OPHTHALMIC | Status: AC | PRN
  Administered 2019-04-14 (×3): 1 [drp] via OPHTHALMIC

## 2019-04-14 MED ORDER — BSS IO SOLN
INTRAOCULAR | Status: DC | PRN
  Administered 2019-04-14: 15 mL

## 2019-04-14 MED ORDER — MIDAZOLAM HCL 5 MG/5ML IJ SOLN
INTRAMUSCULAR | Status: DC | PRN
  Administered 2019-04-14 (×2): 1 mg via INTRAVENOUS

## 2019-04-14 MED ORDER — SODIUM HYALURONATE 23 MG/ML IO SOLN
INTRAOCULAR | Status: DC | PRN
  Administered 2019-04-14: 0.6 mL via INTRAOCULAR

## 2019-04-14 MED ORDER — EPINEPHRINE PF 1 MG/ML IJ SOLN
INTRAOCULAR | Status: DC | PRN
  Administered 2019-04-14: 10:00:00 500 mL

## 2019-04-14 MED ORDER — MIDAZOLAM HCL 2 MG/2ML IJ SOLN
INTRAMUSCULAR | Status: AC
  Filled 2019-04-14: qty 2

## 2019-04-14 MED ORDER — NEOMYCIN-POLYMYXIN-DEXAMETH 3.5-10000-0.1 OP SUSP
OPHTHALMIC | Status: DC | PRN
  Administered 2019-04-14: 1 [drp] via OPHTHALMIC

## 2019-04-14 MED ORDER — POVIDONE-IODINE 5 % OP SOLN
OPHTHALMIC | Status: DC | PRN
  Administered 2019-04-14: 1 via OPHTHALMIC

## 2019-04-14 MED ORDER — LIDOCAINE HCL (PF) 1 % IJ SOLN
INTRAOCULAR | Status: DC | PRN
  Administered 2019-04-14: 1 mL via OPHTHALMIC

## 2019-04-14 MED ORDER — LIDOCAINE HCL 3.5 % OP GEL: 1.0000 "application " | Freq: Once | OPHTHALMIC | Status: DC

## 2019-04-14 MED ORDER — TETRACAINE HCL 0.5 % OP SOLN
1.0000 [drp] | OPHTHALMIC | Status: AC | PRN
  Administered 2019-04-14 (×3): 1 [drp] via OPHTHALMIC

## 2019-04-14 MED ORDER — PROVISC 10 MG/ML IO SOLN
INTRAOCULAR | Status: DC | PRN
  Administered 2019-04-14: 0.85 mL via INTRAOCULAR

## 2019-04-14 MED ORDER — CYCLOPENTOLATE-PHENYLEPHRINE 0.2-1 % OP SOLN
1.0000 [drp] | OPHTHALMIC | Status: AC | PRN
  Administered 2019-04-14 (×3): 1 [drp] via OPHTHALMIC

## 2019-04-14 SURGICAL SUPPLY — 14 items
CLOTH BEACON ORANGE TIMEOUT ST (SAFETY) ×3 IMPLANT
EYE SHIELD UNIVERSAL CLEAR (GAUZE/BANDAGES/DRESSINGS) ×3 IMPLANT
GLOVE BIOGEL PI IND STRL 6.5 (GLOVE) ×1 IMPLANT
GLOVE BIOGEL PI IND STRL 7.0 (GLOVE) ×1 IMPLANT
GLOVE BIOGEL PI INDICATOR 6.5 (GLOVE) ×2
GLOVE BIOGEL PI INDICATOR 7.0 (GLOVE) ×2
LENS ALC ACRYL/TECN (Ophthalmic Related) ×3 IMPLANT
NEEDLE HYPO 18GX1.5 BLUNT FILL (NEEDLE) ×3 IMPLANT
PAD ARMBOARD 7.5X6 YLW CONV (MISCELLANEOUS) ×3 IMPLANT
SYR TB 1ML LL NO SAFETY (SYRINGE) ×3 IMPLANT
TAPE SURG TRANSPORE 1 IN (GAUZE/BANDAGES/DRESSINGS) ×1 IMPLANT
TAPE SURGICAL TRANSPORE 1 IN (GAUZE/BANDAGES/DRESSINGS) ×2
VISCOELASTIC ADDITIONAL (OPHTHALMIC RELATED) ×3 IMPLANT
WATER STERILE IRR 250ML POUR (IV SOLUTION) ×3 IMPLANT

## 2019-04-14 NOTE — Progress Notes (Signed)
Dr Hilaria Ota aware of infiltration. Education provided to patient and wife on caring for IV infiltration. Pt in no pain.   Chandell Attridge Hermina Barters, RN

## 2019-04-14 NOTE — Discharge Instructions (Addendum)
Please discharge patient when stable, will follow up today with Dr. Marisa Hua at the [facility] office immediately following discharge.  Leave shield in place until visit.  All paperwork with discharge instructions will be given at the office.   IV Infiltration IV infiltration is when fluid from your IV leaks under your skin. This can happen if the IV needle comes out of the vein or if the IV pokes through the vein to the other side. What are the causes?  Your IV needle is not inserted in the correct position.  Your IV needle moves after being positioned correctly.  You have swelling (inflammation) or blood flow problems near the IV needle. What are the signs or symptoms?  Pain at the IV site.  Skin at the IV site that is: ? Swollen. ? Tight. ? Stiff.  Skin at the IV site that is: ? Red. ? White. ? Cool-feeling.  A damp or wet bandage (dressing), if you have one.  An IV that works more slowly than normal.  Burning or loss of skin at the IV site. This may happen in very bad cases. How is this treated?  Treatment for mild cases may include: ? Removing your IV. ? Applying a cold compress. ? Applying a warm compress. ? Keeping your arm raised (elevated) when at rest. ? Wearing a bandage if your injection site is leaking. ? Injecting medicine into the area to help your body absorb the leaking fluid.  Treatment for very bad cases may include surgery to remove skin. Follow these instructions at home: Managing pain, stiffness, and swelling      If told, put ice on the swollen area. ? Put ice in a plastic bag. ? Place a towel between your skin and the bag. ? Leave the ice on for 20 minutes, 2-3 times a day.  Raise the swollen area above the level of your heart while you are sitting or lying down.  If told, put heat on the swollen area. Do this as often as told by your doctor. Use the heat source that your doctor recommends, such as a moist heat pack or a heating  pad. ? Place a towel between your skin and the heat source. ? Leave the heat on for 20-30 minutes ? Remove the heat if your skin turns bright red. This is very important if you are unable to feel pain, heat, or cold. You may have a greater risk of getting burned. General instructions  Take over-the-counter and prescription medicines only as told by your doctor.  Do not wear tight-fitting clothing, watches, or jewelry near the swollen area. Contact a doctor if:  You have a fever.  Your skin at the IV site becomes: ? Swollen. ? Red. ? Painful.  Your skin feels numb.  Your skin feels tight or cool to the touch.  You have blisters around the IV site.  Your skin changes color or is bruised.  You cannot feel your pulse where the IV was placed. Get help right away if:  Your skin around the IV site turns dark and peels.  You have red streaks on your skin coming from the IV site.  You have pus coming from the IV site. Summary  IV infiltration is when fluid from your IV leaks under your skin.  This can happen if the IV needle comes out of the vein or if the IV pokes through the vein to the other side.  Treatment may include removing your IV, putting  ice or a warm compress on the area, and wearing a bandage if your injection site is leaking.  Take over-the-counter and prescription medicines only as told by your doctor.  Contact a doctor if your skin at the IV site becomes swollen, red, or painful. This information is not intended to replace advice given to you by your health care provider. Make sure you discuss any questions you have with your health care provider. Document Released: 05/19/2008 Document Revised: 06/09/2018 Document Reviewed: 06/09/2018 Elsevier Patient Education  2020 [employer].

## 2019-04-14 NOTE — Transfer of Care (Signed)
Immediate Anesthesia Transfer of Care Note  Patient: [name]  Procedure(s) Performed: CATARACT EXTRACTION PHACO AND INTRAOCULAR LENS PLACEMENT (IOC) (Left Eye)  Patient Location: Short Stay  Anesthesia Type:MAC  Level of Consciousness: awake and patient cooperative  Airway & Oxygen Therapy: Patient Spontanous Breathing  Post-op Assessment: Report given to RN and Post -op Vital signs reviewed and stable  Post vital signs: Reviewed and stable  Last Vitals:  Vitals Value Taken Time  BP    Temp    Pulse    Resp    SpO2      Last Pain:  Vitals:   04/14/19 0832  TempSrc: Oral  PainSc: 0-No pain      Patients Stated Pain Goal: 5 (65/79/03 [id number])  Complications: No apparent anesthesia complications

## 2019-04-14 NOTE — Op Note (Signed)
Date of procedure: 04/14/19  Pre-operative diagnosis: Visually significant age-related cataract, Left Eye (H25.812)  Post-operative diagnosis: Visually significant age-related cataract, Left Eye  Procedure: Removal of cataract via phacoemulsification and insertion of intra-ocular lens Johnson and [place]  +18.0D into the capsular bag of the Left Eye  Attending surgeon: Gerda Diss. Rohn Fritsch, MD, MA  Anesthesia: MAC, Topical Akten  Complications: None  Estimated Blood Loss: <45m (minimal)  Specimens: None  Implants: As above  Indications:  Visually significant age-related cataract, Left Eye  Procedure:  The patient was seen and identified in the pre-operative area. The operative eye was identified and dilated.  The operative eye was marked.  Topical anesthesia was administered to the operative eye.     The patient was then to the operative suite and placed in the supine position.  A timeout was performed confirming the patient, procedure to be performed, and all other relevant information.   The patient's face was prepped and draped in the usual fashion for intra-ocular surgery.  A lid speculum was placed into the operative eye and the surgical microscope moved into place and focused.  An inferotemporal paracentesis was created using a 20 gauge paracentesis blade.  Shugarcaine was injected into the anterior chamber.  Viscoelastic was injected into the anterior chamber.  A temporal clear-corneal main wound incision was created using a 2.42mmicrokeratome.  A continuous curvilinear capsulorrhexis was initiated using an irrigating cystitome and completed using capsulorrhexis forceps.  Hydrodissection and hydrodeliniation were performed.  Viscoelastic was injected into the anterior chamber.  A phacoemulsification handpiece and a chopper as a second instrument were used to remove the nucleus and epinucleus. The irrigation/aspiration handpiece was used to remove any remaining cortical  material.   The capsular bag was reinflated with viscoelastic, checked, and found to be intact.  The intraocular lens was inserted into the capsular bag and dialed into place using a Kuglen hook.  The irrigation/aspiration handpiece was used to remove any remaining viscoelastic.  The clear corneal wound and paracentesis wounds were then hydrated and checked with Weck-Cels to be watertight.  The lid-speculum and drape was removed, and the patient's face was cleaned with a wet and dry 4x4.  Maxitrol was instilled in the eye before a clear shield was taped over the eye. The patient was taken to the post-operative care unit in good condition, having tolerated the procedure well.  Post-Op Instructions: [facility] and will receive all other orders and instructions.

## 2019-04-14 NOTE — Interval H&P Note (Signed)
History and Physical Interval Note: The H and P was reviewed and updated. The patient was examined.  No changes were found after exam.  The surgical eye was marked.  04/14/2019 9:26 AM  [name]  has presented today for surgery, with the diagnosis of Nuclear sclerotic cataract - LEFT EYE.  The various methods of treatment have been discussed with the patient and family. After consideration of risks, benefits and other options for treatment, the patient has consented to  Procedure(s) with comments: CATARACT EXTRACTION PHACO AND INTRAOCULAR LENS PLACEMENT (IOC) (Left) - left - pt knows to arrive at 7:45 as a surgical intervention.  The patient's history has been reviewed, patient examined, no change in status, stable for surgery.  I have reviewed the patient's chart and labs.  Questions were answered to the patient's satisfaction.     Baruch Goldmann

## 2019-04-14 NOTE — Anesthesia Postprocedure Evaluation (Signed)
Anesthesia Post Note  Patient: [name]  Procedure(s) Performed: CATARACT EXTRACTION PHACO AND INTRAOCULAR LENS PLACEMENT (IOC) (Left Eye)  Patient location during evaluation: Short Stay Anesthesia Type: MAC Level of consciousness: awake and oriented Pain management: pain level controlled Vital Signs Assessment: post-procedure vital signs reviewed and stable Respiratory status: spontaneous breathing and respiratory function stable Cardiovascular status: blood pressure returned to baseline Postop Assessment: no apparent nausea or vomiting Anesthetic complications: no     Last Vitals:  Vitals:   04/14/19 0832 04/14/19 0913  BP: 123/68   Pulse: 68   Resp: (!) 22 (!) 23  Temp: 36.9 C   SpO2: 99%     Last Pain:  Vitals:   04/14/19 0832  TempSrc: Oral  PainSc: 0-No pain                 Eliav Mechling J

## 2019-04-14 NOTE — Anesthesia Preprocedure Evaluation (Addendum)
Anesthesia Evaluation  Patient identified by MRN, date of birth, ID band Patient awake    Reviewed: Allergy & Precautions, NPO status , Patient's Chart, lab work & pertinent test results  Airway Mallampati: II  TM Distance: >3 FB Neck ROM: Full    Dental no notable dental hx.    Pulmonary asthma , pneumonia, resolved, former smoker,    Pulmonary exam normal breath sounds clear to auscultation       Cardiovascular Exercise Tolerance: Poor hypertension, Pt. on medications and Pt. on home beta blockers +CHF  Normal cardiovascular exam+ dysrhythmias Atrial Fibrillation II Rhythm:Irregular Rate:Normal  On Eliquis, states feels in usual state of health  In Aflutter on moniter , on Eliquis and B blocker  Old Echo shows indication Aflutter - poor historian  After D/w pt and Dr. Viona Gilmore - both WTP    Neuro/Psych negative neurological ROS  negative psych ROS   GI/Hepatic negative GI ROS, Neg liver ROS,   Endo/Other  negative endocrine ROSdiabetes  Renal/GU Renal InsufficiencyRenal disease  negative genitourinary   Musculoskeletal negative musculoskeletal ROS (+)   Abdominal   Peds negative pediatric ROS (+)  Hematology negative hematology ROS (+) H/o DLgBCL s/p BMT 2007 Appears to have had recurrence since  Very poor historian      Anesthesia Other Findings   Reproductive/Obstetrics negative OB ROS                            Anesthesia Physical Anesthesia Plan  ASA: III  Anesthesia Plan: MAC   Post-op Pain Management:    Induction: Intravenous  PONV Risk Score and Plan:   Airway Management Planned: Nasal Cannula  Additional Equipment:   Intra-op Plan:   Post-operative Plan:   Informed Consent: I have reviewed the patients History and Physical, chart, labs and discussed the procedure including the risks, benefits and alternatives for the proposed anesthesia with the patient or  authorized representative who has indicated his/her understanding and acceptance.     Dental advisory given  Plan Discussed with: CRNA  Anesthesia Plan Comments: (Plan Full PPE use  Plan MAC - WTP as previosly  03/31/19 without issues )        Anesthesia Quick Evaluation

## 2019-04-17 ENCOUNTER — Encounter (HOSPITAL_COMMUNITY): Payer: Self-pay | Admitting: Ophthalmology

## 2019-04-24 DIAGNOSIS — E782 Mixed hyperlipidemia: Secondary | ICD-10-CM | POA: Diagnosis not present

## 2019-04-24 DIAGNOSIS — I10 Essential (primary) hypertension: Secondary | ICD-10-CM | POA: Diagnosis not present

## 2019-04-24 DIAGNOSIS — E119 Type 2 diabetes mellitus without complications: Secondary | ICD-10-CM | POA: Diagnosis not present

## 2019-05-24 DIAGNOSIS — E039 Hypothyroidism, unspecified: Secondary | ICD-10-CM | POA: Diagnosis not present

## 2019-05-24 DIAGNOSIS — E7800 Pure hypercholesterolemia, unspecified: Secondary | ICD-10-CM | POA: Diagnosis not present

## 2019-05-24 DIAGNOSIS — K219 Gastro-esophageal reflux disease without esophagitis: Secondary | ICD-10-CM | POA: Diagnosis not present

## 2019-05-24 DIAGNOSIS — I10 Essential (primary) hypertension: Secondary | ICD-10-CM | POA: Diagnosis not present

## 2019-06-24 DIAGNOSIS — E7849 Other hyperlipidemia: Secondary | ICD-10-CM | POA: Diagnosis not present

## 2019-06-24 DIAGNOSIS — E1165 Type 2 diabetes mellitus with hyperglycemia: Secondary | ICD-10-CM | POA: Diagnosis not present

## 2019-06-24 DIAGNOSIS — E1129 Type 2 diabetes mellitus with other diabetic kidney complication: Secondary | ICD-10-CM | POA: Diagnosis not present

## 2019-06-24 DIAGNOSIS — E1140 Type 2 diabetes mellitus with diabetic neuropathy, unspecified: Secondary | ICD-10-CM | POA: Diagnosis not present

## 2019-06-30 ENCOUNTER — Inpatient Hospital Stay (HOSPITAL_COMMUNITY): Payer: Medicare Other | Attending: Hematology

## 2019-07-19 ENCOUNTER — Other Ambulatory Visit: Payer: Self-pay

## 2019-07-19 DIAGNOSIS — Z20822 Contact with and (suspected) exposure to covid-19: Secondary | ICD-10-CM

## 2019-07-21 LAB — NOVEL CORONAVIRUS, NAA: SARS-CoV-2, NAA: NOT DETECTED

## 2019-07-24 DIAGNOSIS — E7849 Other hyperlipidemia: Secondary | ICD-10-CM | POA: Diagnosis not present

## 2019-07-24 DIAGNOSIS — E1129 Type 2 diabetes mellitus with other diabetic kidney complication: Secondary | ICD-10-CM | POA: Diagnosis not present

## 2019-07-24 DIAGNOSIS — I2510 Atherosclerotic heart disease of native coronary artery without angina pectoris: Secondary | ICD-10-CM | POA: Diagnosis not present

## 2019-07-24 DIAGNOSIS — I4891 Unspecified atrial fibrillation: Secondary | ICD-10-CM | POA: Diagnosis not present

## 2019-08-02 ENCOUNTER — Emergency Department (HOSPITAL_COMMUNITY): Payer: Medicare Other

## 2019-08-02 ENCOUNTER — Encounter (HOSPITAL_COMMUNITY): Payer: Self-pay | Admitting: *Deleted

## 2019-08-02 ENCOUNTER — Inpatient Hospital Stay (HOSPITAL_COMMUNITY)
Admission: EM | Admit: 2019-08-02 | Discharge: 2019-08-07 | DRG: 809 | Disposition: A | Discharge status: Home / Self Care | Payer: Medicare Other | Attending: Hospitalist | Admitting: Hospitalist

## 2019-08-02 ENCOUNTER — Other Ambulatory Visit: Payer: Self-pay

## 2019-08-02 DIAGNOSIS — R0600 Dyspnea, unspecified: Secondary | ICD-10-CM | POA: Diagnosis present

## 2019-08-02 DIAGNOSIS — I110 Hypertensive heart disease with heart failure: Secondary | ICD-10-CM | POA: Diagnosis present

## 2019-08-02 DIAGNOSIS — Z20828 Contact with and (suspected) exposure to other viral communicable diseases: Secondary | ICD-10-CM | POA: Diagnosis present

## 2019-08-02 DIAGNOSIS — J90 Pleural effusion, not elsewhere classified: Secondary | ICD-10-CM | POA: Diagnosis not present

## 2019-08-02 DIAGNOSIS — Z9889 Other specified postprocedural states: Secondary | ICD-10-CM

## 2019-08-02 DIAGNOSIS — D649 Anemia, unspecified: Secondary | ICD-10-CM | POA: Diagnosis present

## 2019-08-02 DIAGNOSIS — J9811 Atelectasis: Secondary | ICD-10-CM | POA: Diagnosis not present

## 2019-08-02 DIAGNOSIS — Z86010 Personal history of colonic polyps: Secondary | ICD-10-CM

## 2019-08-02 DIAGNOSIS — I5032 Chronic diastolic (congestive) heart failure: Secondary | ICD-10-CM | POA: Diagnosis not present

## 2019-08-02 DIAGNOSIS — Z23 Encounter for immunization: Secondary | ICD-10-CM

## 2019-08-02 DIAGNOSIS — C8338 Diffuse large B-cell lymphoma, lymph nodes of multiple sites: Secondary | ICD-10-CM | POA: Diagnosis not present

## 2019-08-02 DIAGNOSIS — Z7901 Long term (current) use of anticoagulants: Secondary | ICD-10-CM | POA: Diagnosis not present

## 2019-08-02 DIAGNOSIS — I872 Venous insufficiency (chronic) (peripheral): Secondary | ICD-10-CM | POA: Diagnosis present

## 2019-08-02 DIAGNOSIS — D509 Iron deficiency anemia, unspecified: Secondary | ICD-10-CM | POA: Diagnosis not present

## 2019-08-02 DIAGNOSIS — Z87891 Personal history of nicotine dependence: Secondary | ICD-10-CM

## 2019-08-02 DIAGNOSIS — D539 Nutritional anemia, unspecified: Secondary | ICD-10-CM

## 2019-08-02 DIAGNOSIS — R0602 Shortness of breath: Secondary | ICD-10-CM | POA: Diagnosis not present

## 2019-08-02 DIAGNOSIS — Z79899 Other long term (current) drug therapy: Secondary | ICD-10-CM

## 2019-08-02 DIAGNOSIS — R0609 Other forms of dyspnea: Secondary | ICD-10-CM | POA: Diagnosis present

## 2019-08-02 DIAGNOSIS — C8330 Diffuse large B-cell lymphoma, unspecified site: Secondary | ICD-10-CM | POA: Diagnosis not present

## 2019-08-02 DIAGNOSIS — E782 Mixed hyperlipidemia: Secondary | ICD-10-CM | POA: Diagnosis not present

## 2019-08-02 DIAGNOSIS — T50Z15A Adverse effect of immunoglobulin, initial encounter: Secondary | ICD-10-CM | POA: Diagnosis present

## 2019-08-02 DIAGNOSIS — I5030 Unspecified diastolic (congestive) heart failure: Secondary | ICD-10-CM

## 2019-08-02 DIAGNOSIS — Z801 Family history of malignant neoplasm of trachea, bronchus and lung: Secondary | ICD-10-CM

## 2019-08-02 DIAGNOSIS — D590 Drug-induced autoimmune hemolytic anemia: Principal | ICD-10-CM | POA: Diagnosis present

## 2019-08-02 DIAGNOSIS — K689 Other disorders of retroperitoneum: Secondary | ICD-10-CM | POA: Diagnosis not present

## 2019-08-02 DIAGNOSIS — R591 Generalized enlarged lymph nodes: Secondary | ICD-10-CM | POA: Diagnosis not present

## 2019-08-02 DIAGNOSIS — K760 Fatty (change of) liver, not elsewhere classified: Secondary | ICD-10-CM | POA: Diagnosis not present

## 2019-08-02 DIAGNOSIS — E119 Type 2 diabetes mellitus without complications: Secondary | ICD-10-CM | POA: Diagnosis present

## 2019-08-02 DIAGNOSIS — Z9484 Stem cells transplant status: Secondary | ICD-10-CM

## 2019-08-02 DIAGNOSIS — D5919 Other autoimmune hemolytic anemia: Secondary | ICD-10-CM

## 2019-08-02 DIAGNOSIS — E669 Obesity, unspecified: Secondary | ICD-10-CM | POA: Diagnosis present

## 2019-08-02 DIAGNOSIS — Z6838 Body mass index (BMI) 38.0-38.9, adult: Secondary | ICD-10-CM

## 2019-08-02 DIAGNOSIS — Z9221 Personal history of antineoplastic chemotherapy: Secondary | ICD-10-CM

## 2019-08-02 DIAGNOSIS — Z560 Unemployment, unspecified: Secondary | ICD-10-CM

## 2019-08-02 DIAGNOSIS — Z923 Personal history of irradiation: Secondary | ICD-10-CM

## 2019-08-02 DIAGNOSIS — I4892 Unspecified atrial flutter: Secondary | ICD-10-CM | POA: Diagnosis not present

## 2019-08-02 DIAGNOSIS — Z880 Allergy status to penicillin: Secondary | ICD-10-CM

## 2019-08-02 LAB — CBC WITH DIFFERENTIAL/PLATELET
Abs Immature Granulocytes: 0.04 10*3/uL (ref 0.00–0.07)
Basophils Absolute: 0.1 10*3/uL (ref 0.0–0.1)
Basophils Relative: 1 %
Eosinophils Absolute: 0.3 10*3/uL (ref 0.0–0.5)
Eosinophils Relative: 3 %
HCT: 25.1 % — ABNORMAL LOW (ref 39.0–52.0)
Hemoglobin: 7.1 g/dL — ABNORMAL LOW (ref 13.0–17.0)
Immature Granulocytes: 1 %
Lymphocytes Relative: 15 %
Lymphs Abs: 1.2 10*3/uL (ref 0.7–4.0)
MCH: 38.8 pg — ABNORMAL HIGH (ref 26.0–34.0)
MCHC: 28.3 g/dL — ABNORMAL LOW (ref 30.0–36.0)
MCV: 137.2 fL — ABNORMAL HIGH (ref 80.0–100.0)
Monocytes Absolute: 0.7 10*3/uL (ref 0.1–1.0)
Monocytes Relative: 10 %
Neutro Abs: 5.4 10*3/uL (ref 1.7–7.7)
Neutrophils Relative %: 70 %
Platelets: 258 10*3/uL (ref 150–400)
RBC: 1.83 MIL/uL — ABNORMAL LOW (ref 4.22–5.81)
RDW: 21.3 % — ABNORMAL HIGH (ref 11.5–15.5)
WBC: 7.7 10*3/uL (ref 4.0–10.5)
nRBC: 2.1 % — ABNORMAL HIGH (ref 0.0–0.2)

## 2019-08-02 LAB — COMPREHENSIVE METABOLIC PANEL
ALT: 12 U/L (ref 0–44)
AST: 25 U/L (ref 15–41)
Albumin: 4.2 g/dL (ref 3.5–5.0)
Alkaline Phosphatase: 53 U/L (ref 38–126)
Anion gap: 12 (ref 5–15)
BUN: 12 mg/dL (ref 8–23)
CO2: 21 mmol/L — ABNORMAL LOW (ref 22–32)
Calcium: 8.9 mg/dL (ref 8.9–10.3)
Chloride: 107 mmol/L (ref 98–111)
Creatinine, Ser: 1.16 mg/dL (ref 0.61–1.24)
GFR calc Af Amer: >60 mL/min (ref >60)
GFR calc non Af Amer: >60 mL/min (ref >60)
Glucose, Bld: 100 mg/dL — ABNORMAL HIGH (ref 70–99)
Potassium: 3.7 mmol/L (ref 3.5–5.1)
Sodium: 140 mmol/L (ref 135–145)
Total Bilirubin: 5.6 mg/dL — ABNORMAL HIGH (ref 0.3–1.2)
Total Protein: 7.5 g/dL (ref 6.5–8.1)

## 2019-08-02 LAB — BILIRUBIN, DIRECT: Bilirubin, Direct: 0.6 mg/dL — ABNORMAL HIGH (ref 0.0–0.2)

## 2019-08-02 LAB — TROPONIN I (HIGH SENSITIVITY)
Troponin I (High Sensitivity): 10 ng/L (ref <18)
Troponin I (High Sensitivity): 9 ng/L (ref <18)

## 2019-08-02 LAB — URINALYSIS, ROUTINE W REFLEX MICROSCOPIC
Bacteria, UA: NONE SEEN
Bilirubin Urine: NEGATIVE
Glucose, UA: NEGATIVE mg/dL
Ketones, ur: 5 mg/dL — AB
Leukocytes,Ua: NEGATIVE
Nitrite: NEGATIVE
Protein, ur: 30 mg/dL — AB
Specific Gravity, Urine: 1.018 (ref 1.005–1.030)
pH: 5 (ref 5.0–8.0)

## 2019-08-02 LAB — VITAMIN B12: Vitamin B-12: 444 pg/mL (ref 180–914)

## 2019-08-02 LAB — LACTATE DEHYDROGENASE: LDH: 496 U/L — ABNORMAL HIGH (ref 98–192)

## 2019-08-02 LAB — SARS CORONAVIRUS 2 (TAT 6-24 HRS): SARS Coronavirus 2: NEGATIVE

## 2019-08-02 LAB — CK: Total CK: 69 U/L (ref 49–397)

## 2019-08-02 LAB — RETICULOCYTES
Immature Retic Fract: 44.8 % — ABNORMAL HIGH (ref 2.3–15.9)
RBC.: 1.66 MIL/uL — ABNORMAL LOW (ref 4.22–5.81)
Retic Count, Absolute: 329.3 10*3/uL — ABNORMAL HIGH (ref 19.0–186.0)
Retic Ct Pct: 19.8 % — ABNORMAL HIGH (ref 0.4–3.1)

## 2019-08-02 LAB — IRON AND TIBC
Iron: 133 ug/dL (ref 45–182)
Saturation Ratios: 32 % (ref 17.9–39.5)
TIBC: 410 ug/dL (ref 250–450)
UIBC: 277 ug/dL

## 2019-08-02 LAB — HEMOGLOBIN AND HEMATOCRIT, BLOOD
HCT: 23 % — ABNORMAL LOW (ref 39.0–52.0)
Hemoglobin: 6.7 g/dL — CL (ref 13.0–17.0)

## 2019-08-02 LAB — FERRITIN: Ferritin: 74 ng/mL (ref 24–336)

## 2019-08-02 LAB — PREPARE RBC (CROSSMATCH)

## 2019-08-02 LAB — FOLATE: Folate: 6.7 ng/mL (ref >5.9)

## 2019-08-02 LAB — BRAIN NATRIURETIC PEPTIDE: B Natriuretic Peptide: 209 pg/mL — ABNORMAL HIGH (ref 0.0–100.0)

## 2019-08-02 MED ORDER — PANTOPRAZOLE SODIUM 40 MG IV SOLR
40.0000 mg | Freq: Two times a day (BID) | INTRAVENOUS | Status: DC
  Administered 2019-08-02: 12:00:00 40 mg via INTRAVENOUS
  Filled 2019-08-02: qty 40

## 2019-08-02 MED ORDER — METOPROLOL TARTRATE 25 MG PO TABS
25.0000 mg | ORAL_TABLET | Freq: Two times a day (BID) | ORAL | Status: DC
  Administered 2019-08-02 – 2019-08-07 (×10): 25 mg via ORAL
  Filled 2019-08-02 (×10): qty 1

## 2019-08-02 MED ORDER — PANTOPRAZOLE SODIUM 40 MG PO TBEC
40.0000 mg | DELAYED_RELEASE_TABLET | Freq: Every day | ORAL | Status: DC
  Administered 2019-08-02 – 2019-08-07 (×6): 40 mg via ORAL
  Filled 2019-08-02 (×6): qty 1

## 2019-08-02 MED ORDER — SODIUM CHLORIDE 0.9% IV SOLUTION
Freq: Once | INTRAVENOUS | Status: AC
  Administered 2019-08-02: 12:00:00 10 mL via INTRAVENOUS

## 2019-08-02 MED ORDER — INFLUENZA VAC A&B SA ADJ QUAD 0.5 ML IM PRSY
0.5000 mL | PREFILLED_SYRINGE | INTRAMUSCULAR | Status: AC
  Administered 2019-08-04: 10:00:00 0.5 mL via INTRAMUSCULAR
  Filled 2019-08-02: qty 0.5

## 2019-08-02 NOTE — Progress Notes (Signed)
Late entry  Spoke with lab, patient's blood for transfusion has not arrived from [facility]. Lab to call when blood arrives.

## 2019-08-02 NOTE — Consult Note (Signed)
Referring Provider: No ref. provider found Primary Care Physician:  Redmond School, MD Primary Gastroenterologist:  Dr. Sydell Axon   Reason for Consultation: Anemia  HPI: [name] is a 68 y.o. male with a past medical history of hypertension, atrial flutter on Eliquis, diastolic heart failure, diabetes mellitus type 2, diffuse large B-cell lymphoma status post stem cell transplant 2007, iron deficiency anemia and chronic lower extremity edema. He presented to [facility] earlier today with complaints of shortness of breath and a dry cough has progressively worsened over the past 2 weeks.  He producing clear to white sputum.  No hemoptysis.  His laboratory studies are significant for profound anemia.  Hemoglobin 7.1.  Hematocrit 25.1.  MCV 137.2.  His baseline Hg 14.5.  Total bilirubin level is elevated at 5.6.  BUN is 12.  He denies having any dysphagia, heartburn or upper abdominal pain.  No lower abdominal pain.  He passed a black solid stool for few days 6 or 7 months ago.  No further black stools since that time.  He is passing a normal formed brown bowel movement once daily.  A rectal exam was done by the ER physician which identified brown stool in the rectum which was guaiac negative.  He is on Eliquis 5 mg p.o. twice daily, his last dose was taken at 7 PM on 12/08.  He takes ibuprofen 200 mg 2 tabs once daily as needed for knee pain, he last took approximately 4 weeks ago.  He has a history of iron deficiency anemia which include an extensive GI evaluation by Dr. Emerson Monte in 2016.  An EGD 03/08/2015 identified a hiatal hernia with polypoid gastric mucosa with multiple gastric polyps.  A colonoscopy was done on the same date which identified several polyps and a colonic lipoma at the terminal ileum.  The EGD and colonoscopy findings did not explain his iron deficiency anemia.  He underwent a Givens capsule endoscopy 03/14/2015 which identified several AVMs, the distal small bowel showed  evidence of ileal lymphoid hyperplasia.  He denies any family history of esophageal, stomach or colorectal cancer.  Mother died from lung cancer.  He is followed by hematology by Francene Finders FNP. He last received an iron infusion was 03/16/2016.  He denies having any fever or sweats.  He has occasional chills.  No significant weight loss.  He denies alcohol or drug use.  ED course: Sodium 140.  Potassium 3.7.  Glucose 100.  BUN 12.  Creatinine 1.16.  Calcium 8.9.  Alk phos 53.  Albumin 4.2.  AST 25.  ALT 12.  Total bili 5.6.  Troponin X.  BNP 209.  WBC 7.7.  Hemoglobin 7.1.  Hematocrit 25.1.  MCV 137.2.  Platelet 258.  SARS coronavirus 2 pending.   Chest x-ray: No acute process in the chest.  He received Pantoprazole 40 mg IV x1.  Comparison labs 12/23/2018: WBC 4.0.  Hemoglobin 14.5.  Hematocrit 44.3.  MCV 94.7.  Platelet 189.  EGD 03/08/2015 by Dr. Gala Romney: Hiatal hernia. Polypoid gastric mucosa with multiple gastric polyps. Largest polyp removed via snare polypectomy hemostasis clip placed at base. Status post gastric biopsy. Status post video capsule placement Follow up on pathology. See colonoscopy report.  Colonoscopy 03/08/2015: Redundant colon. Multiple colonic and rectosigmoid hyperplastic polyps removed/ablated as described above. Colonic lipoma Abnormal appearing terminal ileum (likely a variant of normal). However, with history, biopsies obtained. No explanation for iron deficiency anemia based on today's colonoscopy findings.  Givens capsule endoscopy 03/14/2015: Pyloric  channel angulated and difficult to deploy capsule via the scope; therefore, it was deployed in the antrum. Capsule study was complete to the cecum. Exam somewhat limited due to debris, bile. Question of AVM at 4:00:36 vs downstream remnant from polypectomy, biopsies. Likely definite AVM at 4:07:33. Again at 4:39:23 a question of old blood. Interesting appearance to distal small bowel consistent with ileal lymphoid hyperplasia.  This was also noted on colonoscopy. Bile stained mucosa throughout distal small bowel. No obvious mass noted. Query possibility of AVMs as culprit for IDA. Due to amount of time in stomach, unable to rule out delayed gastric emptying  Chest/abdominal/pelvic CT with contrast 05/18/2017: No pulmonary embolus. Patchy consolidations of bilateral lungs suspicious for developing pneumonias. Small left pleural effusion. No evidence of diverticulitis. No acute abnormality identified in the abdomen and pelvis. Fatty infiltration of liver  Past Medical History:  Diagnosis Date   Asthma    as child   Diabetes mellitus without complication ([place])    DLBCL (diffuse large B cell lymphoma) ([place]) 02/28/2009   Edema    Heart failure, diastolic, chronic ([place])    patient denies   Hyperlipidemia, mixed    Hypertension    IDA (iron deficiency anemia)    Large cell lymphoma ([place]) 01/2005   autologous stem cell transplant 12/2005   Obesity, morbid (more than 100 lbs over ideal weight or BMI > 40) ([place])    Venous insufficiency 04/29/2011    Past Surgical History:  Procedure Laterality Date   BACK SURGERY  2006   T6 vertebrae removed/titanium placed   CATARACT EXTRACTION W/PHACO Right 03/31/2019   Procedure: CATARACT EXTRACTION PHACO AND INTRAOCULAR LENS PLACEMENT ([place]);  Surgeon: Baruch Goldmann, MD;  Location: [facility];  Service: Ophthalmology;  Laterality: Right;  CDE: 3.21   CATARACT EXTRACTION W/PHACO Left 04/14/2019   Procedure: CATARACT EXTRACTION PHACO AND INTRAOCULAR LENS PLACEMENT ([facility]);  Surgeon: Baruch Goldmann, MD;  Location: [facility];  Service: Ophthalmology;  Laterality: Left;  left - pt knows to arrive at 7:45, CDE: 3.55   COLONOSCOPY  11/24/2004   Polyps in the left colon ablated/removed as described above.  Two submucosal lesions consistent with lipomas as described above not  manipulated./ Normal rectum   COLONOSCOPY  06/20/2012   MULTIPLE RECTAL AND COLONIC POLYPS   COLONOSCOPY  N/A 03/08/2015   RMR: Capacious, redundant colon. Multiple colonic and rectosigmoid polyps removed. ablated as described above. colonic lipoma abnormal appearing terminal ileum likely a variant of normal). Howeverwith history  biopsies obtained.    ESOPHAGOGASTRODUODENOSCOPY N/A 03/08/2015   RMR: Hiatal hernia Polypoid gastric mucosa with multiple gastric polyps. largest polyp removed via snare polypectomgy hemostasis clip placed at base. Status post gastric biopsy. Status post video capsule placement.    GIVENS CAPSULE STUDY N/A 03/08/2015   Procedure: GIVENS CAPSULE STUDY;  Surgeon: Daneil Dolin, MD;  Location: [facility];  Service: Endoscopy;  Laterality: N/A;   LIMBAL STEM CELL TRANSPLANT     LUNG BIOPSY  6/06   MULTIPLE TOOTH EXTRACTIONS  04/2005   port a cath placement     PORT-A-CATH REMOVAL  09/08/2012   Procedure: REMOVAL PORT-A-CATH;  Surgeon: Melrose Nakayama, MD;  Location: [facility];  Service: Thoracic;  Laterality: N/A;    Prior to Admission medications   Medication Sig Start Date End Date Taking? Authorizing Provider  apixaban (ELIQUIS) 5 MG TABS tablet Take 1 tablet (5 mg total) by mouth 2 (two) times daily. 01/24/18  Yes Herminio Commons, MD  clotrimazole-betamethasone (  LOTRISONE) cream Apply 1 application topically 2 (two) times daily as needed (leg infection).  09/15/18  Yes [provider]  furosemide (LASIX) 20 MG tablet Take 20 mg by mouth daily as needed for fluid or edema.  09/27/17  Yes [provider]  glimepiride (AMARYL) 1 MG tablet take 1/2 tablet by mouth once daily WITH BREAKFAST Patient taking differently: Take 1 mg by mouth 2 (two) times daily with a meal. take 1 tablet by mouth twice daily WITH BREAKFAST 09/08/17  Yes Johnson, Clanford L, MD  metoprolol tartrate (LOPRESSOR) 25 MG tablet Take 1 tablet (25 mg total) by mouth 2 (two) times daily. 09/23/17 08/02/19 Yes Strader, Fransisco Hertz, PA-C  rosuvastatin (CRESTOR) 40 MG tablet Take 40 mg by  mouth daily. 05/04/19  Yes [provider]    Current Facility-Administered Medications  Medication Dose Route Frequency Provider Last Rate Last Dose   [START ON 08/03/2019] influenza vaccine adjuvanted (FLUAD) injection 0.5 mL  0.5 mL Intramuscular Tomorrow-1000 Enzo Bi, MD       pantoprazole (PROTONIX) injection 40 mg  40 mg Intravenous Toy Cookey, MD   40 mg at 08/02/19 1148   Current Outpatient Medications  Medication Sig Dispense Refill   apixaban (ELIQUIS) 5 MG TABS tablet Take 1 tablet (5 mg total) by mouth 2 (two) times daily. 60 tablet 11   clotrimazole-betamethasone (LOTRISONE) cream Apply 1 application topically 2 (two) times daily as needed (leg infection).      furosemide (LASIX) 20 MG tablet Take 20 mg by mouth daily as needed for fluid or edema.   0   glimepiride (AMARYL) 1 MG tablet take 1/2 tablet by mouth once daily WITH BREAKFAST (Patient taking differently: Take 1 mg by mouth 2 (two) times daily with a meal. take 1 tablet by mouth twice daily WITH BREAKFAST) 15 tablet 0   metoprolol tartrate (LOPRESSOR) 25 MG tablet Take 1 tablet (25 mg total) by mouth 2 (two) times daily. 180 tablet 3   rosuvastatin (CRESTOR) 40 MG tablet Take 40 mg by mouth daily.      Allergies as of 08/02/2019 - Review Complete 08/02/2019  Allergen Reaction Noted   Penicillins Rash 04/29/2011    Family History  Problem Relation Age of Onset   Cancer Mother        lung   Cancer Father        prostate   Colon cancer Neg Hx    Diabetes Neg Hx     Social History   Socioeconomic History   Marital status: Married    Spouse name: Not on file   Number of children: 2   Years of education: Not on file   Highest education level: Not on file  Occupational History   Occupation: disability due to back    Employer: UNEMPLOYED  Social Designer, fashion/clothing strain: Not on file   Food insecurity    Worry: Not on file    Inability: Not on file    Transportation needs    Medical: Not on file    Non-medical: Not on file  Tobacco Use   Smoking status: Former Smoker    Packs/day: 0.50    Years: 15.00    Pack years: 7.50    Quit date: 11/11/2002    Years since quitting: 16.7   Smokeless tobacco: Never Used  Substance and Sexual Activity   Alcohol use: No    Alcohol/week: 0.0 standard drinks   Drug use: No   Sexual activity:  Yes    Birth control/protection: None  Lifestyle   Physical activity    Days per week: Not on file    Minutes per session: Not on file   Stress: Not on file  Relationships   Social connections    Talks on phone: Not on file    Gets together: Not on file    Attends religious service: Not on file    Active member of club or organization: Not on file    Attends meetings of clubs or organizations: Not on file    Relationship status: Not on file   Intimate partner violence    Fear of current or ex partner: Not on file    Emotionally abused: Not on file    Physically abused: Not on file    Forced sexual activity: Not on file  Other Topics Concern   Not on file  Social History Narrative   Married   No regular exercise    Review of Systems: Gen: Positive chills. Denies fever or sweats. No weight loss.  CV: Denies chest pain or palpitations. Resp: See HPI. GI: Denies heartburn, dysphagia, stomach or lower abdominal pain. No diarrhea or constipation.  See HPI. GU : Denies urinary burning, blood in urine, increased urinary frequency or incontinence. MS: Positive knee pain. Derm: Healing ulcer to his left lower extremity associated with chronic edema. Psych: Denies depression, anxiety, memory loss, suicidal ideation and confusion. Heme: Denies bruising, bleeding. Neuro:  Denies headaches, dizziness or paresthesias. Endo:  Denies any problems with DM, thyroid or adrenal function.  Physical Exam: Vital signs in last 24 hours: Temp:  [98.8 F (37.1 C)] 98.8 F (37.1 C) (12/09 0926) Pulse  Rate:  [71-80] 77 (12/09 1130) Resp:  [23-40] 40 (12/09 1130) BP: (124-145)/(49-68) 134/49 (12/09 1130) SpO2:  [98 %-100 %] 98 % (12/09 1130) Weight:  [449 kg] 127 kg (12/09 6759)   General:   Alert well-developed 68 year old male in no acute distress Head:  Normocephalic and atraumatic. Eyes: Scleral icterus present.  Conjunctiva pink. Ears:  Normal auditory acuity. Nose:  No deformity, discharge or lesions. Mouth: Absent dentition.  No ulcers or lesions.   Neck:  Supple. Lungs: Crackles to the right lower base otherwise clear throughout. Heart: Regular rate and rhythm, no murmurs. Abdomen: Soft, nondistended, positive bowel sounds to all 4 quadrants, no masses or organomegaly. Rectal:  Deferred.  Till exam done by the ER physician documented brown stool in the rectum which was guaiac negative. Msk:  Symmetrical without gross deformities.  Pulses:  Normal pulses noted. Extremities: 2+ bilateral lower extremity edema. Neurologic:  Alert and  oriented x4;  grossly normal neurologically. Skin: No obvious jaundice.  No rash. Psych:  Alert and cooperative. Normal mood and affect.  Intake/Output from previous day: No intake/output data recorded. Intake/Output this shift: No intake/output data recorded.  Lab Results: Recent Labs    08/02/19 0839  WBC 7.7  HGB 7.1*  HCT 25.1*  PLT 258   BMET Recent Labs    08/02/19 0839  NA 140  K 3.7  CL 107  CO2 21*  GLUCOSE 100*  BUN 12  CREATININE 1.16  CALCIUM 8.9   LFT Recent Labs    08/02/19 0839  PROT 7.5  ALBUMIN 4.2  AST 25  ALT 12  ALKPHOS 53  BILITOT 5.6*   PT/INR No results for input(s): LABPROT, INR in the last 72 hours. Hepatitis Panel No results for input(s): HEPBSAG, HCVAB, HEPAIGM, HEPBIGM in the last 72 hours.  Studies/Results: Dg Chest Portable 1 View  Result Date: 08/02/2019 CLINICAL DATA:  Shortness of breath EXAM: PORTABLE CHEST 1 VIEW COMPARISON:  02/09/2018 FINDINGS: Chronic peripheral lower lung  pleuroparenchymal opacities. No new consolidation or edema. Stable cardiomediastinal contours. Thoracic spine fixation hardware is again noted. IMPRESSION: No acute process in the chest. Electronically Signed   By: Macy Mis M.D.   On: 08/02/2019 09:13    IMPRESSION/PLAN:  79.  68 year old male [facility] ER with shortness of breath found to have profound macrocytic anemia Hg 7.1, HCT 25.1. MCV 137.2 with an elevated total bilirubin level with evidence of scleral icterus concerning for hemolysis, however, rectal exam identified guaiac negative stool.  History of small bowel AVMs. -Agree with transfusion 1 PRBC -Repeat H&H posttransfusion -Pathologist smear review to be obtained stat prior to blood transfusion -LDH, haptoglobin level -Iron panel, vitamin B12 and reticulocyte count already ordered -No plans for endoscopic evaluation at this time -Clear liquid diet -Continue Pantoprazole 40 mg IV twice daily down -Consider chest CT  2.  History of atrial flutter on Eliquis.  Last dose of Eliquis was 7 PM on 12/8. -Hold Eliquis for now  3.  History of hyperplastic colon polyps  4.  History of diffuse large B-cell lymphoma status post stem cell transplant 2007  Further recommendations per Dr. Merrily Pew Dorathy Daft  08/02/2019, 12:03 PM

## 2019-08-02 NOTE — ED Provider Notes (Signed)
[facility] EMERGENCY DEPARTMENT Provider Note   CSN: [id number] Arrival date & time: 08/02/19  2683     History   Chief Complaint Chief Complaint  Patient presents with  . Shortness of Breath    HPI [name] is a 68 y.o. male.     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  Patient developed dry cough as well.  No known Covid contacts.  No fevers or chills or vomiting.  Patient has chronic leg edema not specifically worse bilateral.  No blood clot history no major surgeries recently.  Patient has not had any worsening orthopnea.     Past Medical History:  Diagnosis Date  . Asthma    as child  . Diabetes mellitus without complication ([place])   . DLBCL (diffuse large B cell lymphoma) ([place]) 02/28/2009  . Edema   . Heart failure, diastolic, chronic ([place])    patient denies  . Hyperlipidemia, mixed   . Hypertension   . IDA (iron deficiency anemia)   . Large cell lymphoma ([place]) 01/2005   autologous stem cell transplant 12/2005  . Obesity, morbid (more than 100 lbs over ideal weight or BMI > 40) (H[facility])   . Venous insufficiency 04/29/2011    Patient Active Problem List   Diagnosis Date Noted  . Anemia 08/02/2019  . Atrial flutter ([place]) 09/07/2017  . Bilateral pneumonia 09/07/2017  . Acute respiratory failure with hypoxia ([place]) 05/19/2017  . AKI (acute kidney injury) ([place])   . Sepsis due to pneumonia ([place]) 05/18/2017  . Diabetes ([place]) 02/19/2017  . Essential hypertension 04/03/2015  . IDA (iron deficiency anemia)   . Mucosal abnormality of stomach   . Hx of adenomatous colonic polyps 06/13/2012  . Status post autologous bone marrow transplantation ([place]) 07/15/2011  . Venous insufficiency 04/29/2011  . DLBCL (diffuse large B cell lymphoma) ([place])  02/28/2009  . HYPERLIPIDEMIA-MIXED 02/28/2009  . OBESITY-MORBID (>100') 02/28/2009  . DIASTOLIC HEART FAILURE, CHRONIC 02/28/2009  . EDEMA 02/28/2009    Past Surgical History:  Procedure Laterality Date  . BACK SURGERY  2006   T6 vertebrae removed/titanium placed  . CATARACT EXTRACTION W/PHACO Right 03/31/2019   Procedure: CATARACT EXTRACTION PHACO AND INTRAOCULAR LENS PLACEMENT (IOC);  Surgeon: Baruch Goldmann, MD;  Location: [facility] ORS;  Service: Ophthalmology;  Laterality: Right;  CDE: 3.21  . CATARACT EXTRACTION W/PHACO Left 04/14/2019   Procedure: CATARACT EXTRACTION PHACO AND INTRAOCULAR LENS PLACEMENT (IOC);  Surgeon: Baruch Goldmann, MD;  Location: [facility];  Service: Ophthalmology;  Laterality: Left;  left - pt knows to arrive at 7:45, CDE: 3.55  . COLONOSCOPY  11/24/2004   Polyps in the left colon ablated/removed as described above.  Two submucosal lesions consistent with lipomas as described above not  manipulated./ Normal rectum  . COLONOSCOPY  06/20/2012   MULTIPLE RECTAL AND COLONIC POLYPS  . COLONOSCOPY N/A 03/08/2015   RMR: Capacious, redundant colon. Multiple colonic and rectosigmoid polyps removed. ablated as described above. colonic lipoma abnormal appearing terminal ileum likely a variant of normal). Howeverwith history  biopsies obtained.   . ESOPHAGOGASTRODUODENOSCOPY N/A 03/08/2015   RMR: Hiatal hernia Polypoid gastric mucosa with multiple gastric polyps. largest polyp removed via snare polypectomgy hemostasis clip placed at base. Status post gastric biopsy. Status post video capsule placement.   Marland Kitchen GIVENS CAPSULE  STUDY N/A 03/08/2015   Procedure: GIVENS CAPSULE STUDY;  Surgeon: Daneil Dolin, MD;  Location: [facility] ENDO SUITE;  Service: Endoscopy;  Laterality: N/A;  . LIMBAL STEM CELL TRANSPLANT    . LUNG BIOPSY  6/06  . MULTIPLE TOOTH EXTRACTIONS  04/2005  . port a cath placement    . PORT-A-CATH REMOVAL  09/08/2012   Procedure: REMOVAL PORT-A-CATH;  Surgeon: Melrose Nakayama,  MD;  Location[facility];  Service: Thoracic;  Laterality: N/A;        Home Medications    Prior to Admission medications   Medication Sig Start Date End Date Taking? Authorizing Provider  apixaban (ELIQUIS) 5 MG TABS tablet Take 1 tablet (5 mg total) by mouth 2 (two) times daily. 01/24/18  Yes Herminio Commons, MD  clotrimazole-betamethasone (LOTRISONE) cream Apply 1 application topically 2 (two) times daily as needed (leg infection).  09/15/18  Yes [provider]  furosemide (LASIX) 20 MG tablet Take 20 mg by mouth daily as needed for fluid or edema.  09/27/17  Yes [provider]  glimepiride (AMARYL) 1 MG tablet take 1/2 tablet by mouth once daily WITH BREAKFAST Patient taking differently: Take 1 mg by mouth 2 (two) times daily with a meal. take 1 tablet by mouth twice daily WITH BREAKFAST 09/08/17  Yes Johnson, Clanford L, MD  metoprolol tartrate (LOPRESSOR) 25 MG tablet Take 1 tablet (25 mg total) by mouth 2 (two) times daily. 09/23/17 08/02/19 Yes Strader, Fransisco Hertz, PA-C  rosuvastatin (CRESTOR) 40 MG tablet Take 40 mg by mouth daily. 05/04/19  Yes [provider]    Family History Family History  Problem Relation Age of Onset  . Cancer Mother        lung  . Cancer Father        prostate  . Colon cancer Neg Hx   . Diabetes Neg Hx     Social History Social History   Tobacco Use  . Smoking status: Former Smoker    Packs/day: 0.50    Years: 15.00    Pack years: 7.50    Quit date: 11/11/2002    Years since quitting: 16.7  . Smokeless tobacco: Never Used  Substance Use Topics  . Alcohol use: No    Alcohol/week: 0.0 standard drinks  . Drug use: No     Allergies   Penicillins   Review of Systems Review of Systems  Constitutional: Positive for fatigue. Negative for chills and fever.  HENT: Negative for congestion.   Eyes: Negative for visual disturbance.  Respiratory: Positive for cough and shortness of breath.   Cardiovascular: Negative for  chest pain.  Gastrointestinal: Negative for abdominal pain, anal bleeding, blood in stool and vomiting.  Genitourinary: Negative for dysuria and flank pain.  Musculoskeletal: Negative for back pain, neck pain and neck stiffness.  Skin: Negative for rash.  Neurological: Negative for light-headedness and headaches.     Physical Exam Updated Vital Signs BP 131/68   Pulse 78   Temp 98.8 F (37.1 C) (Rectal)   Resp (!) 25   Ht 5' 11.5" (1.816 m)   Wt 127 kg   SpO2 100%   BMI 38.51 kg/m   Physical Exam Vitals signs and nursing note reviewed.  Constitutional:      Appearance: He is well-developed.  HENT:     Head: Normocephalic and atraumatic.  Eyes:     General:        Right eye: No discharge.  Left eye: No discharge.     Conjunctiva/sclera: Conjunctivae normal.  Neck:     Musculoskeletal: Normal range of motion and neck supple.     Trachea: No tracheal deviation.  Cardiovascular:     Rate and Rhythm: Normal rate and regular rhythm.  Pulmonary:     Effort: Pulmonary effort is normal.     Breath sounds: Rales (sparse bases) present.  Abdominal:     General: There is no distension.     Palpations: Abdomen is soft.     Tenderness: There is no abdominal tenderness. There is no guarding.  Musculoskeletal:     Right lower leg: Edema present.     Left lower leg: Edema present.  Skin:    General: Skin is warm.     Findings: No rash.  Neurological:     Mental Status: He is alert and oriented to person, place, and time.  Psychiatric:        Mood and Affect: Mood normal.      ED Treatments / Results  Labs (all labs ordered are listed, but only abnormal results are displayed) Labs Reviewed  COMPREHENSIVE METABOLIC PANEL - Abnormal; Notable for the following components:      Result Value   CO2 21 (*)    Glucose, Bld 100 (*)    Total Bilirubin 5.6 (*)    All other components within normal limits  CBC WITH DIFFERENTIAL/PLATELET - Abnormal; Notable for the  following components:   RBC 1.83 (*)    Hemoglobin 7.1 (*)    HCT 25.1 (*)    MCV 137.2 (*)    MCH 38.8 (*)    MCHC 28.3 (*)    RDW 21.3 (*)    nRBC 2.1 (*)    All other components within normal limits  BRAIN NATRIURETIC PEPTIDE - Abnormal; Notable for the following components:   B Natriuretic Peptide 209.0 (*)    All other components within normal limits  SARS CORONAVIRUS 2 (TAT 6-24 HRS)  URINE CULTURE  VITAMIN B12  FOLATE  IRON AND TIBC  FERRITIN  RETICULOCYTES  URINALYSIS, ROUTINE W REFLEX MICROSCOPIC  CK  POC OCCULT BLOOD, ED  TYPE AND SCREEN  PREPARE RBC (CROSSMATCH)  TROPONIN I (HIGH SENSITIVITY)  TROPONIN I (HIGH SENSITIVITY)    EKG None  Radiology Dg Chest Portable 1 View  Result Date: 08/02/2019 CLINICAL DATA:  Shortness of breath EXAM: PORTABLE CHEST 1 VIEW COMPARISON:  02/09/2018 FINDINGS: Chronic peripheral lower lung pleuroparenchymal opacities. No new consolidation or edema. Stable cardiomediastinal contours. Thoracic spine fixation hardware is again noted. IMPRESSION: No acute process in the chest. Electronically Signed   By: Macy Mis M.D.   On: 08/02/2019 09:13    Procedures .Critical Care Performed by: Elnora Morrison, MD Authorized by: Elnora Morrison, MD   Critical care provider statement:    Critical care time (minutes):  35   Critical care start time:  08/02/2019 9:00 AM   Critical care end time:  08/02/2019 9:35 AM   Critical care time was exclusive of:  Separately billable procedures and treating other patients and teaching time   Critical care was necessary to treat or prevent imminent or life-threatening deterioration of the following conditions:  Circulatory failure   Critical care was time spent personally by me on the following activities:  Evaluation of patient's response to treatment, examination of patient, ordering and performing treatments and interventions, ordering and review of laboratory studies, ordering and review of  radiographic studies, pulse oximetry, re-evaluation of patient's  condition, obtaining history from patient or surrogate and review of old charts   (including critical care time)  Medications Ordered in ED Medications  0.9 %  sodium chloride infusion (Manually program via Guardrails IV Fluids) (has no administration in time range)     Initial Impression / Assessment and Plan / ED Course  I have reviewed the triage vital signs and the nursing notes.  Pertinent labs & imaging results that were available during my care of the patient were reviewed by me and considered in my medical decision making (see chart for details).       Patient with history of iron deficiency anemia heart failure presents with worsening dyspnea.  Discussed differential diagnosis including acute heart failure exacerbation, ACS equivalent, Covid/pneumonia, anemia, other. Patient's blood work reviewed concerning for hemoglobin 7.1 significantly decreased compared to previous on file.  Has BNP approximately 200.  Patient clinically symptomatic anemia type and screen and type and cross for 1 unit ordered.  Anemia panel sent.  Patient will need admission for further evaluation.  Patient denied gross bleeding however will obtain Hemoccult test at this time.  On reassessment patient comfortable, denies blood in the stools, brown stool on exam, Hemoccult negative. Patient is on anticoagulant.  Patient does report blood or dark urine.  Urinalysis and CK added.  Discussed with hospitalist for admission for further evaluation of worsening anemia.  The patients results and plan were reviewed and discussed.   Any x-rays performed were independently reviewed by myself.   Differential diagnosis were considered with the presenting HPI.  Medications  0.9 %  sodium chloride infusion (Manually program via Guardrails IV Fluids) (has no administration in time range)    Vitals:   08/02/19 0900 08/02/19 0926 08/02/19 0930 08/02/19 1000   BP: (!) 124/51  129/64 131/68  Pulse: 79  79 78  Resp: (!) 25  (!) 27 (!) 25  Temp:  98.8 F (37.1 C)    TempSrc:  Rectal    SpO2: 100%  99% 100%  Weight:      Height:        Final diagnoses:  Acute dyspnea  Symptomatic anemia  Diastolic heart failure, unspecified HF chronicity ([place])    Admission/ observation were discussed with the admitting physician, patient and/or family and they are comfortable with the plan.    Final Clinical Impressions(s) / ED Diagnoses   Final diagnoses:  Acute dyspnea  Symptomatic anemia  Diastolic heart failure, unspecified HF chronicity [facility])    ED Discharge Orders    None       Elnora Morrison, MD 08/02/19 1040

## 2019-08-02 NOTE — Progress Notes (Signed)
CRITICAL VALUE ALERT  Critical Value:  Hemoglobin 6.7  Date & Time Notied:  08/02/2019 1555  Provider Notified: Dr. Billie Ruddy  Orders Received/Actions taken:

## 2019-08-02 NOTE — ED Triage Notes (Signed)
Pt comes in from home with c/o SOB and dry cough over the last couple of weeks. Pt reports he has been trying to be seen by PCP but they wouldn't see him until he had a Covid test. Pt reports his Covid test is still pending at this time. Denies fever, diarrhea, sore throat, runny nose.

## 2019-08-02 NOTE — H&P (Addendum)
History and Physical    [name] TGG:[id number] DOB: [birth year] DOA: 08/02/2019  PCP: Redmond School, MD  Patient coming from: home  I have personally briefly reviewed patient's old medical records in [place]  Chief Complaint: worsening dyspnea  HPI: [name] is a 68 y.o. male with medical history significant for hypertension, atrial flutter on Eliquis, diastolic heart failure, diabetes mellitus type 2, diffuse large B-cell lymphoma status post stem cell transplant 2007, iron deficiency anemia who presented with worsening dyspnea.   Pt complained of dyspnea, worse with exertion, for the past couple of weeks.  Has a cough but no sputum production.  No fever, chest pain, abdominal pain, N/V/D, dysuria.  Has chronic leg swelling that's not worse.  No signs of bleeding.  Normal PO intake, no change in BM or stools.    ED Course: afebrile, HR 80, BP 145/67, RR 23, sating 100% on room air.  Labs notable for normal WBC, Hgb 7.1 (14.5 back in May 2020), MCV 137, total bili 5.6, BNP 209, CXR wnl.  Rectal exam by ED provider showed brown stool.  1u pRBC ordered in the ED.  Review of Systems: As per HPI otherwise 10 point review of systems negative.   Past Medical History:  Diagnosis Date  . Asthma    as child  . Diabetes mellitus without complication ([place])   . DLBCL (diffuse large B cell lymphoma) ([place]) 02/28/2009  . Edema   . Heart failure, diastolic, chronic ([place])    patient denies  . Hyperlipidemia, mixed   . Hypertension   . IDA (iron deficiency anemia)   . Large cell lymphoma ([place]) 01/2005   autologous stem cell transplant 12/2005  . Obesity, morbid (more than 100 lbs over ideal weight or BMI > 40) (H[facility])   . Venous insufficiency 04/29/2011    Past Surgical History:  Procedure Laterality Date  . BACK SURGERY  2006   T6 vertebrae removed/titanium placed  . CATARACT EXTRACTION W/PHACO Right 03/31/2019   Procedure: CATARACT EXTRACTION PHACO AND INTRAOCULAR LENS PLACEMENT  (IOC);  Surgeon: Baruch Goldmann, MD;  Location: [facility] ORS;  Service: Ophthalmology;  Laterality: Right;  CDE: 3.21  . CATARACT EXTRACTION W/PHACO Left 04/14/2019   Procedure: CATARACT EXTRACTION PHACO AND INTRAOCULAR LENS PLACEMENT (IOC);  Surgeon: Baruch Goldmann, MD;  Location: [facility];  Service: Ophthalmology;  Laterality: Left;  left - pt knows to arrive at 7:45, CDE: 3.55  . COLONOSCOPY  11/24/2004   Polyps in the left colon ablated/removed as described above.  Two submucosal lesions consistent with lipomas as described above not  manipulated./ Normal rectum  . COLONOSCOPY  06/20/2012   MULTIPLE RECTAL AND COLONIC POLYPS  . COLONOSCOPY N/A 03/08/2015   RMR: Capacious, redundant colon. Multiple colonic and rectosigmoid polyps removed. ablated as described above. colonic lipoma abnormal appearing terminal ileum likely a variant of normal). Howeverwith history  biopsies obtained.   . ESOPHAGOGASTRODUODENOSCOPY N/A 03/08/2015   RMR: Hiatal hernia Polypoid gastric mucosa with multiple gastric polyps. largest polyp removed via snare polypectomgy hemostasis clip placed at base. Status post gastric biopsy. Status post video capsule placement.   Marland Kitchen GIVENS CAPSULE STUDY N/A 03/08/2015   Procedure: GIVENS CAPSULE STUDY;  Surgeon: Daneil Dolin, MD;  Location: [facility];  Service: Endoscopy;  Laterality: N/A;  . LIMBAL STEM CELL TRANSPLANT    . LUNG BIOPSY  6/06  . MULTIPLE TOOTH EXTRACTIONS  04/2005  . port a cath placement    . PORT-A-CATH REMOVAL  09/08/2012   Procedure: REMOVAL PORT-A-CATH;  Surgeon: Melrose Nakayama, MD;  Location: [place];  Service: Thoracic;  Laterality: N/A;     reports that he quit smoking about 16 years ago. He has a 7.50 pack-year smoking history. He has never used smokeless tobacco. He reports that he does not drink alcohol or use drugs.  Allergies  Allergen Reactions  . Penicillins Rash    Has patient had a PCN reaction causing immediate rash, facial/tongue/throat  swelling, SOB or lightheadedness with hypotension: Unknown Has patient had a PCN reaction causing severe rash involving mucus membranes or skin necrosis: Unknown Has patient had a PCN reaction that required hospitalization: Unknown Has patient had a PCN reaction occurring within the last 10 years: No If all of the above answers are "NO", then may proceed with Cephalosporin use.     Family History  Problem Relation Age of Onset  . Cancer Mother        lung  . Cancer Father        prostate  . Colon cancer Neg Hx   . Diabetes Neg Hx     Prior to Admission medications   Medication Sig Start Date End Date Taking? Authorizing Provider  apixaban (ELIQUIS) 5 MG TABS tablet Take 1 tablet (5 mg total) by mouth 2 (two) times daily. 01/24/18  Yes Herminio Commons, MD  clotrimazole-betamethasone (LOTRISONE) cream Apply 1 application topically 2 (two) times daily as needed (leg infection).  09/15/18  Yes [provider]  furosemide (LASIX) 20 MG tablet Take 20 mg by mouth daily as needed for fluid or edema.  09/27/17  Yes [provider]  glimepiride (AMARYL) 1 MG tablet take 1/2 tablet by mouth once daily WITH BREAKFAST Patient taking differently: Take 1 mg by mouth 2 (two) times daily with a meal. take 1 tablet by mouth twice daily WITH BREAKFAST 09/08/17  Yes Johnson, Clanford L, MD  metoprolol tartrate (LOPRESSOR) 25 MG tablet Take 1 tablet (25 mg total) by mouth 2 (two) times daily. 09/23/17 08/02/19 Yes Strader, Fransisco Hertz, PA-C  rosuvastatin (CRESTOR) 40 MG tablet Take 40 mg by mouth daily. 05/04/19  Yes [provider]    Physical Exam: Vitals:   08/02/19 0926 08/02/19 0930 08/02/19 1000 08/02/19 1030  BP:  129/64 131/68 126/62  Pulse:  79 78 71  Resp:  (!) 27 (!) 25 (!) 28  Temp: 98.8 F (37.1 C)     TempSrc: Rectal     SpO2:  99% 100% 100%  Weight:      Height:        Vitals:   08/02/19 0926 08/02/19 0930 08/02/19 1000 08/02/19 1030  BP:  129/64 131/68  126/62  Pulse:  79 78 71  Resp:  (!) 27 (!) 25 (!) 28  Temp: 98.8 F (37.1 C)     TempSrc: Rectal     SpO2:  99% 100% 100%  Weight:      Height:       Constitutional: NAD, AAOx3 HEENT: conjunctivae icterus, EOMI CV: RRR no M,R,G. Distal pulses +2.  No cyanosis.   RESP: CTA B/L, increased RR, on room air GI: +BS, NTND MSK: normal ROM and strength, no joint enlargement or tenderness of both UE and LE SKIN: warm, dry and intact Neuro: II - XII grossly intact.  Sensation intact Psych: Normal mood and affect.  Appropriate judgement and reason  Labs on Admission: I have personally reviewed following labs and imaging studies  CBC: Recent Labs  Lab 08/02/19 0839  WBC 7.7  NEUTROABS 5.4  HGB 7.1*  HCT 25.1*  MCV 137.2*  PLT 096   Basic Metabolic Panel: Recent Labs  Lab 08/02/19 0839  NA 140  K 3.7  CL 107  CO2 21*  GLUCOSE 100*  BUN 12  CREATININE 1.16  CALCIUM 8.9   GFR: Estimated Creatinine Clearance: 84.5 mL/min (by C-G formula based on SCr of 1.16 mg/dL). Liver Function Tests: Recent Labs  Lab 08/02/19 0839  AST 25  ALT 12  ALKPHOS 53  BILITOT 5.6*  PROT 7.5  ALBUMIN 4.2   No results for input(s): LIPASE, AMYLASE in the last 168 hours. No results for input(s): AMMONIA in the last 168 hours. Coagulation Profile: No results for input(s): INR, PROTIME in the last 168 hours. Cardiac Enzymes: No results for input(s): CKTOTAL, CKMB, CKMBINDEX, TROPONINI in the last 168 hours. BNP (last 3 results) No results for input(s): PROBNP in the last 8760 hours. HbA1C: No results for input(s): HGBA1C in the last 72 hours. CBG: No results for input(s): GLUCAP in the last 168 hours. Lipid Profile: No results for input(s): CHOL, HDL, LDLCALC, TRIG, CHOLHDL, LDLDIRECT in the last 72 hours. Thyroid Function Tests: No results for input(s): TSH, T4TOTAL, FREET4, T3FREE, THYROIDAB in the last 72 hours. Anemia Panel: No results for input(s): VITAMINB12, FOLATE, FERRITIN,  TIBC, IRON, RETICCTPCT in the last 72 hours. Urine analysis:    Component Value Date/Time   COLORURINE YELLOW 09/06/2017 2119   APPEARANCEUR CLEAR 09/06/2017 2119   LABSPEC 1.021 09/06/2017 2119   PHURINE 5.0 09/06/2017 2119   GLUCOSEU NEGATIVE 09/06/2017 2119   HGBUR NEGATIVE 09/06/2017 2119   [place] NEGATIVE 09/06/2017 2119   [place] NEGATIVE 09/06/2017 2119   PROTEINUR 100 (A) 09/06/2017 2119   NITRITE NEGATIVE 09/06/2017 2119   LEUKOCYTESUR NEGATIVE 09/06/2017 2119    Radiological Exams on Admission: Dg Chest Portable 1 View  Result Date: 08/02/2019 CLINICAL DATA:  Shortness of breath EXAM: PORTABLE CHEST 1 VIEW COMPARISON:  02/09/2018 FINDINGS: Chronic peripheral lower lung pleuroparenchymal opacities. No new consolidation or edema. Stable cardiomediastinal contours. Thoracic spine fixation hardware is again noted. IMPRESSION: No acute process in the chest. Electronically Signed   By: Macy Mis M.D.   On: 08/02/2019 09:13     Assessment/Plan Active Problems:   Anemia  # Symptomatic macrocytic anemia --On presentation, Hgb 7.1, MCV 137 (14.5 and 94.7 respectively back in May 2020).  HDS.  No obvious signs of GI bleed, though is on Eliquis.  Elevated bili suggests hemolysis.   --1u pRBC ordered in the ED. PLAN:  --Transfuse and check post-transfusion Hgb --Basic anemia workup, specifically checking hemolysis with LDH, haptoglobin, reticulocytes and blood smear. --Start PPI BID --GI consult --Hematology consult   # Dyspnea on exertion --Increased RR though sating well on room air.  CXR no acute finding.  No signs of volume overload or PNA.  Dyspnea likely due to anemia. --COVID test obtained in the ED. PLAN:  --f/u COVID test result --workup for anemia and transfuse PRN  # Atrial flutter on Eliquis, rate controlled --continue home metop --Hold home Eliquis for now  # DM2, not on insulin --Most recent A1c wnl. --Hold home oral agent while inpatient  #  HLD --continue home statin   DVT prophylaxis: SCD/Compression stockings Code Status: Full code  Family Communication: none today  Disposition Plan: Home 2-3 days  Consults called: GI Admission status: Inpatient   Enzo Bi MD [facility]  If 7PM-7AM, please contact night-coverage  08/02/2019, 11:05 AM

## 2019-08-02 NOTE — ED Notes (Signed)
ED TO INPATIENT HANDOFF REPORT  ED Nurse Name and Phone #: Glena Pharris [phone]  S Name/Age/Gender [name] 68 y.o. male Room/Bed: APA05/APA05  Code Status   Code Status: Prior  Home/SNF/Other Home Patient oriented to: self, place, time and situation Is this baseline? Yes   Triage Complete: Triage complete  Chief Complaint sob  Triage Note Pt comes in from home with c/o SOB and dry cough over the last couple of weeks. Pt reports he has been trying to be seen by PCP but they wouldn't see him until he had a Covid test. Pt reports his Covid test is still pending at this time. Denies fever, diarrhea, sore throat, runny nose.    Allergies Allergies  Allergen Reactions  . Penicillins Rash    Has patient had a PCN reaction causing immediate rash, facial/tongue/throat swelling, SOB or lightheadedness with hypotension: Unknown Has patient had a PCN reaction causing severe rash involving mucus membranes or skin necrosis: Unknown Has patient had a PCN reaction that required hospitalization: Unknown Has patient had a PCN reaction occurring within the last 10 years: No If all of the above answers are "NO", then may proceed with Cephalosporin use.     Level of Care/Admitting Diagnosis ED Disposition    ED Disposition Condition [facility] Area: [facility] [[id number]]  Level of Care: Med-Surg [16]  Covid Evaluation: Symptomatic Person Under Investigation (PUI)  Diagnosis: Anemia [229798]  Admitting Physician: Enzo Bi [[id number]]  Attending Physician: Enzo Bi [[id number]]  PT Class (Do Not Modify): Observation [104]  PT Acc Code (Do Not Modify): Observation [10022]       B Medical/Surgery History Past Medical History:  Diagnosis Date  . Asthma    as child  . Diabetes mellitus without complication ([place])   . DLBCL (diffuse large B cell lymphoma) ([place]) 02/28/2009  . Edema   . Heart failure, diastolic, chronic ([place])    patient denies  . Hyperlipidemia,  mixed   . Hypertension   . IDA (iron deficiency anemia)   . Large cell lymphoma ([place]) 01/2005   autologous stem cell transplant 12/2005  . Obesity, morbid (more than 100 lbs over ideal weight or BMI > 40) ([facility])   . Venous insufficiency 04/29/2011   Past Surgical History:  Procedure Laterality Date  . BACK SURGERY  2006   T6 vertebrae removed/titanium placed  . CATARACT EXTRACTION W/PHACO Right 03/31/2019   Procedure: CATARACT EXTRACTION PHACO AND INTRAOCULAR LENS PLACEMENT (IOC);  Surgeon: Baruch Goldmann, MD;  Location: [facility];  Service: Ophthalmology;  Laterality: Right;  CDE: 3.21  . CATARACT EXTRACTION W/PHACO Left 04/14/2019   Procedure: CATARACT EXTRACTION PHACO AND INTRAOCULAR LENS PLACEMENT (IOC);  Surgeon: Baruch Goldmann, MD;  Location: [facility];  Service: Ophthalmology;  Laterality: Left;  left - pt knows to arrive at 7:45, CDE: 3.55  . COLONOSCOPY  11/24/2004   Polyps in the left colon ablated/removed as described above.  Two submucosal lesions consistent with lipomas as described above not  manipulated./ Normal rectum  . COLONOSCOPY  06/20/2012   MULTIPLE RECTAL AND COLONIC POLYPS  . COLONOSCOPY N/A 03/08/2015   RMR: Capacious, redundant colon. Multiple colonic and rectosigmoid polyps removed. ablated as described above. colonic lipoma abnormal appearing terminal ileum likely a variant of normal). Howeverwith history  biopsies obtained.   . ESOPHAGOGASTRODUODENOSCOPY N/A 03/08/2015   RMR: Hiatal hernia Polypoid gastric mucosa with multiple gastric polyps. largest polyp removed via snare polypectomgy hemostasis clip placed at base. Status post gastric  biopsy. Status post video capsule placement.   Marland Kitchen GIVENS CAPSULE STUDY N/A 03/08/2015   Procedure: GIVENS CAPSULE STUDY;  Surgeon: Daneil Dolin, MD;  Location: [facility];  Service: Endoscopy;  Laterality: N/A;  . LIMBAL STEM CELL TRANSPLANT    . LUNG BIOPSY  6/06  . MULTIPLE TOOTH EXTRACTIONS  04/2005  . port a cath placement    .  PORT-A-CATH REMOVAL  09/08/2012   Procedure: REMOVAL PORT-A-CATH;  Surgeon: Melrose Nakayama, MD;  Location: [place];  Service: Thoracic;  Laterality: N/A;     A IV Location/Drains/Wounds Patient Lines/Drains/Airways Status   Active Line/Drains/Airways    Name:   Placement date:   Placement time:   Site:   Days:   Peripheral IV 08/02/19 Right Antecubital   08/02/19    0826    Antecubital   less than 1   Incision 09/08/12 Chest Right   09/08/12    1346     2519   Incision (Closed) 03/31/19 Eye Right   03/31/19    1117     124   Incision (Closed) 04/14/19 Eye Left   04/14/19    0954     110          Intake/Output Last 24 hours No intake or output data in the 24 hours ending 08/02/19 1202  Labs/Imaging Results for orders placed or performed during the hospital encounter of 08/02/19 (from the past 48 hour(s))  Comprehensive metabolic panel     Status: Abnormal   Collection Time: 08/02/19  8:39 AM  Result Value Ref Range   Sodium 140 135 - 145 mmol/L   Potassium 3.7 3.5 - 5.1 mmol/L   Chloride 107 98 - 111 mmol/L   CO2 21 (L) 22 - 32 mmol/L   Glucose, Bld 100 (H) 70 - 99 mg/dL   BUN 12 8 - 23 mg/dL   Creatinine, Ser 1.16 0.61 - 1.24 mg/dL   Calcium 8.9 8.9 - 10.3 mg/dL   Total Protein 7.5 6.5 - 8.1 g/dL   Albumin 4.2 3.5 - 5.0 g/dL   AST 25 15 - 41 U/L   ALT 12 0 - 44 U/L   Alkaline Phosphatase 53 38 - 126 U/L   Total Bilirubin 5.6 (H) 0.3 - 1.2 mg/dL   GFR calc non Af Amer >60 >60 mL/min   GFR calc Af Amer >60 >60 mL/min   Anion gap 12 5 - 15    Comment: Performed at [facility], [address], [place], [place] [address]  CBC with Differential     Status: Abnormal   Collection Time: 08/02/19  8:39 AM  Result Value Ref Range   WBC 7.7 4.0 - 10.5 K/uL   RBC 1.83 (L) 4.22 - 5.81 MIL/uL   Hemoglobin 7.1 (L) 13.0 - 17.0 g/dL   HCT 25.1 (L) 39.0 - 52.0 %   MCV 137.2 (H) 80.0 - 100.0 fL   MCH 38.8 (H) 26.0 - 34.0 pg   MCHC 28.3 (L) 30.0 - 36.0 g/dL   RDW 21.3 (H) 11.5 -  15.5 %   Platelets 258 150 - 400 K/uL   nRBC 2.1 (H) 0.0 - 0.2 %   Neutrophils Relative % 70 %   Neutro Abs 5.4 1.7 - 7.7 K/uL   Lymphocytes Relative 15 %   Lymphs Abs 1.2 0.7 - 4.0 K/uL   Monocytes Relative 10 %   Monocytes Absolute 0.7 0.1 - 1.0 K/uL   Eosinophils Relative 3 %  Eosinophils Absolute 0.3 0.0 - 0.5 K/uL   Basophils Relative 1 %   Basophils Absolute 0.1 0.0 - 0.1 K/uL   RBC Morphology ANISOCYTOSIS AND MACROCYTOSIS    Immature Granulocytes 1 %   Abs Immature Granulocytes 0.04 0.00 - 0.07 K/uL   Polychromasia PRESENT     Comment: Performed at [facility], [address], [place], [place] [address]  Troponin I (High Sensitivity)     Status: None   Collection Time: 08/02/19  8:39 AM  Result Value Ref Range   Troponin I (High Sensitivity) 10 <18 ng/L    Comment: (NOTE) Elevated high sensitivity troponin I (hsTnI) values and significant  changes across serial measurements may suggest ACS but many other  chronic and acute conditions are known to elevate hsTnI results.  Refer to the "Links" section for chest pain algorithms and additional  guidance. Performed at [facility], [address], [place], [place] [address]   Brain natriuretic peptide     Status: Abnormal   Collection Time: 08/02/19  8:41 AM  Result Value Ref Range   B Natriuretic Peptide 209.0 (H) 0.0 - 100.0 pg/mL    Comment: Performed at [facility], [address], [place], [place] [address]   Dg Chest Portable 1 View  Result Date: 08/02/2019 CLINICAL DATA:  Shortness of breath EXAM: PORTABLE CHEST 1 VIEW COMPARISON:  02/09/2018 FINDINGS: Chronic peripheral lower lung pleuroparenchymal opacities. No new consolidation or edema. Stable cardiomediastinal contours. Thoracic spine fixation hardware is again noted. IMPRESSION: No acute process in the chest. Electronically Signed   By: Macy Mis M.D.   On: 08/02/2019 09:13    Pending Labs Unresulted Labs (From admission, onward)    Start     Ordered    08/02/19 1042  Hemoglobin and hematocrit, blood  ONCE - STAT,   STAT     08/02/19 1041   08/02/19 1039  Urinalysis, Routine w reflex microscopic  Once,   STAT     08/02/19 1038   08/02/19 1039  Urine culture  ONCE - STAT,   STAT     08/02/19 1038   08/02/19 1039  CK  ONCE - STAT,   STAT     08/02/19 1038   08/02/19 0933  Type and screen  ONCE - STAT,   STAT     08/02/19 0932   08/02/19 0933  Vitamin B12  (Anemia Panel (PNL))  ONCE - STAT,   STAT     08/02/19 0932   08/02/19 0933  Folate  (Anemia Panel (PNL))  ONCE - STAT,   STAT     08/02/19 0932   08/02/19 0933  Iron and TIBC  (Anemia Panel (PNL))  ONCE - STAT,   STAT     08/02/19 0932   08/02/19 0933  Ferritin  (Anemia Panel (PNL))  ONCE - STAT,   STAT     08/02/19 0932   08/02/19 0933  Reticulocytes  (Anemia Panel (PNL))  ONCE - STAT,   STAT     08/02/19 0932   08/02/19 0933  Prepare RBC  (Emergency Blood Administration - Red Blood Cells)  Once,   R    Question Answer Comment  # of Units 1 unit   Transfusion Indications Emergent/ Trauma   Transfusion Indications Symptomatic Anemia      08/02/19 0933   08/02/19 0841  SARS CORONAVIRUS 2 (TAT 6-24 HRS) Nasopharyngeal Nasopharyngeal Swab  (Asymptomatic/Tier 3)  ONCE - STAT,   STAT    Question Answer Comment  Is this test for diagnosis or screening Diagnosis of ill patient   Symptomatic for COVID-19 as defined by CDC Yes   Date of Symptom Onset 07/26/2019   Hospitalized for COVID-19 Yes   Admitted to ICU for COVID-19 No   Previously tested for COVID-19 No   Resident in a congregate (group) care setting No   Employed in healthcare setting No      08/02/19 0841   Signed and Held  HIV Antibody (routine testing w rflx)  (HIV Antibody (Routine testing w reflex) panel)  Once,   R     Signed and Held   Signed and Held  Basic metabolic panel  Tomorrow morning,   R     Signed and Held   Signed and Held  CBC  Tomorrow morning,   R     Signed and Held          Vitals/Pain Today's  Vitals   08/02/19 1030 08/02/19 1100 08/02/19 1130 08/02/19 1149  BP: 126/62 130/65 (!) 134/49   Pulse: 71 78 77   Resp: (!) 28 (!) 34 (!) 40   Temp:      TempSrc:      SpO2: 100% 100% 98%   Weight:      Height:      PainSc:    0-No pain    Isolation Precautions No active isolations  Medications Medications  pantoprazole (PROTONIX) injection 40 mg (40 mg Intravenous Given 08/02/19 1148)  influenza vaccine adjuvanted (FLUAD) injection 0.5 mL (has no administration in time range)  0.9 %  sodium chloride infusion (Manually program via Guardrails IV Fluids) (10 mLs Intravenous New Bag/Given 08/02/19 1144)    Mobility walks with device Low fall risk   Focused Assessments    R Recommendations: See Admitting Provider Note  Report given to:   Additional Notes:

## 2019-08-02 NOTE — ED Notes (Signed)
Call from dr Shonna Chock, pt may have clear liquids, no plans to scope unless their is a change.

## 2019-08-02 NOTE — Progress Notes (Signed)
Lab unable to draw for type and screen until this afternoon.  Spoke with blood bank and will be several hours until blood is ready due to antibodies.  Spoke with Dr. Billie Ruddy concerning this.

## 2019-08-03 DIAGNOSIS — D539 Nutritional anemia, unspecified: Secondary | ICD-10-CM

## 2019-08-03 DIAGNOSIS — D649 Anemia, unspecified: Secondary | ICD-10-CM

## 2019-08-03 LAB — BASIC METABOLIC PANEL
Anion gap: 10 (ref 5–15)
BUN: 11 mg/dL (ref 8–23)
CO2: 23 mmol/L (ref 22–32)
Calcium: 8.7 mg/dL — ABNORMAL LOW (ref 8.9–10.3)
Chloride: 109 mmol/L (ref 98–111)
Creatinine, Ser: 1.1 mg/dL (ref 0.61–1.24)
GFR calc Af Amer: >60 mL/min (ref >60)
GFR calc non Af Amer: >60 mL/min (ref >60)
Glucose, Bld: 101 mg/dL — ABNORMAL HIGH (ref 70–99)
Potassium: 4.3 mmol/L (ref 3.5–5.1)
Sodium: 142 mmol/L (ref 135–145)

## 2019-08-03 LAB — CBC
HCT: 20.7 % — ABNORMAL LOW (ref 39.0–52.0)
Hemoglobin: 6.3 g/dL — CL (ref 13.0–17.0)
MCH: 41.2 pg — ABNORMAL HIGH (ref 26.0–34.0)
MCHC: 30.4 g/dL (ref 30.0–36.0)
MCV: 135.3 fL — ABNORMAL HIGH (ref 80.0–100.0)
Platelets: 200 10*3/uL (ref 150–400)
RBC: 1.53 MIL/uL — ABNORMAL LOW (ref 4.22–5.81)
RDW: 22.1 % — ABNORMAL HIGH (ref 11.5–15.5)
WBC: 6.2 10*3/uL (ref 4.0–10.5)
nRBC: 2.4 % — ABNORMAL HIGH (ref 0.0–0.2)

## 2019-08-03 LAB — HAPTOGLOBIN: Haptoglobin: <10 mg/dL — ABNORMAL LOW (ref 32–363)

## 2019-08-03 LAB — HIV ANTIBODY (ROUTINE TESTING W REFLEX): HIV Screen 4th Generation wRfx: NONREACTIVE

## 2019-08-03 LAB — HEMOGLOBIN AND HEMATOCRIT, BLOOD
HCT: 25 % — ABNORMAL LOW (ref 39.0–52.0)
Hemoglobin: 7.5 g/dL — ABNORMAL LOW (ref 13.0–17.0)

## 2019-08-03 MED ORDER — ROSUVASTATIN CALCIUM 20 MG PO TABS
40.0000 mg | ORAL_TABLET | Freq: Every day | ORAL | Status: DC
  Administered 2019-08-03 – 2019-08-06 (×4): 40 mg via ORAL
  Filled 2019-08-03 (×4): qty 2

## 2019-08-03 NOTE — Progress Notes (Addendum)
PROGRESS NOTE    [name]  QAS:[id number] DOB: May 13, 1951 DOA: 08/02/2019 PCP: Redmond School, MD    Assessment & Plan:   Active Problems:   Anemia   Macrocytic anemia   [name] is a 68 y.o. male with medical history significant for hypertension, atrial flutter on Eliquis, diastolic heart failure, diabetes mellitus type 2, diffuse large B-cell lymphoma status post stem cell transplant 2007, iron deficiency anemia who presented with worsening dyspnea.   # Symptomatic macrocytic anemia --On presentation, Hgb 7.1, MCV 137 (14.5 and 94.7 respectively back in May 2020).  HDS.  No obvious signs of GI bleed, though is on Eliquis.  Elevated bili suggests hemolysis.   --1u pRBC ordered in the ED. --Workup consistent with hemolysis. --GI consult rec no procedure PLAN:  --Transfuse and check post-transfusion Hgb --continue PPI BID for now --Hematology consult   # Dyspnea on exertion --Increased RR though sating well on room air.  CXR no acute finding.  No signs of volume overload or PNA.  Dyspnea likely due to anemia. --COVID neg PLAN:  --workup for anemia and transfuse PRN  # Atrial flutter on Eliquis, rate controlled --continue home metop --Hold home Eliquis for now  # DM2, not on insulin --Most recent A1c wnl. --Hold home oral agent while inpatient  # HLD --continue home statin   DVT prophylaxis: SCD/Compression stockings Code Status: Full code  Family Communication: none today  Disposition Plan: Home 1-2 days  Consults called: GI and hematology Admission status: Inpatient   Subjective: Pt still has dyspnea, but improved today.  Received 1u pRBC today with appropriate rise of Hgb to 7's.  No fever, chest pain, abdominal pain, N/V/D, dysuria, increased swelling.   Objective: Vitals:   08/03/19 0400 08/03/19 0443 08/03/19 0836 08/03/19 1331  BP: (!) 130/56 (!) 132/58 131/66 103/63  Pulse: 80 81 76 61  Resp: (!) 31 (!) 33  20  Temp: 97.8 F (36.6 C)  97.8 F (36.6 C) 98.8 F (37.1 C) 98.8 F (37.1 C)  TempSrc: Oral  Oral   SpO2: 99% 99% 99% 100%  Weight:      Height:        Intake/Output Summary (Last 24 hours) at 08/03/2019 2059 Last data filed at 08/03/2019 1700 Gross per 24 hour  Intake 1194 ml  Output --  Net 1194 ml   Filed Weights   08/02/19 0821  Weight: 127 kg    Examination:  Constitutional: NAD, AAOx3 HEENT: conjunctivae icterus, EOMI CV: RRR no M,R,G. Distal pulses +2.  No cyanosis.   RESP: CTA B/L, increased RR, on room air GI: +BS, NTND MSK: normal ROM and strength, no joint enlargement or tenderness of both UE and LE SKIN: warm, dry and intact Neuro: II - XII grossly intact.  Sensation intact Psych: Normal mood and affect.  Appropriate judgement and reason   Data Reviewed: I have personally reviewed following labs and imaging studies  CBC: Recent Labs  Lab 08/02/19 0839 08/02/19 1453 08/03/19 0333 08/03/19 1413  WBC 7.7  --  6.2  --   NEUTROABS 5.4  --   --   --   HGB 7.1* 6.7* 6.3* 7.5*  HCT 25.1* 23.0* 20.7* 25.0*  MCV 137.2*  --  135.3*  --   PLT 258  --  200  --    Basic Metabolic Panel: Recent Labs  Lab 08/02/19 0839 08/03/19 0333  NA 140 142  K 3.7 4.3  CL 107 109  CO2 21* 23  GLUCOSE 100* 101*  BUN 12 11  CREATININE 1.16 1.10  CALCIUM 8.9 8.7*   GFR: Estimated Creatinine Clearance: 89.1 mL/min (by C-G formula based on SCr of 1.1 mg/dL). Liver Function Tests: Recent Labs  Lab 08/02/19 0839  AST 25  ALT 12  ALKPHOS 53  BILITOT 5.6*  PROT 7.5  ALBUMIN 4.2   No results for input(s): LIPASE, AMYLASE in the last 168 hours. No results for input(s): AMMONIA in the last 168 hours. Coagulation Profile: No results for input(s): INR, PROTIME in the last 168 hours. Cardiac Enzymes: Recent Labs  Lab 08/02/19 1453  CKTOTAL 69   BNP (last 3 results) No results for input(s): PROBNP in the last 8760 hours. HbA1C: No results for input(s): HGBA1C in the last 72  hours. CBG: No results for input(s): GLUCAP in the last 168 hours. Lipid Profile: No results for input(s): CHOL, HDL, LDLCALC, TRIG, CHOLHDL, LDLDIRECT in the last 72 hours. Thyroid Function Tests: No results for input(s): TSH, T4TOTAL, FREET4, T3FREE, THYROIDAB in the last 72 hours. Anemia Panel: Recent Labs    08/02/19 1453  VITAMINB12 444  FOLATE 6.7  FERRITIN 74  TIBC 410  IRON 133  RETICCTPCT 19.8*   Sepsis Labs: No results for input(s): PROCALCITON, LATICACIDVEN in the last 168 hours.  Recent Results (from the past 240 hour(s))  SARS CORONAVIRUS 2 (TAT 6-24 HRS) Nasopharyngeal Nasopharyngeal Swab     Status: None   Collection Time: 08/02/19  8:41 AM   Specimen: Nasopharyngeal Swab  Result Value Ref Range Status   SARS Coronavirus 2 NEGATIVE NEGATIVE Final    Comment: (NOTE) SARS-CoV-2 target nucleic acids are NOT DETECTED. The SARS-CoV-2 RNA is generally detectable in upper and lower respiratory specimens during the acute phase of infection. Negative results do not preclude SARS-CoV-2 infection, do not rule out co-infections with other pathogens, and should not be used as the sole basis for treatment or other patient management decisions. Negative results must be combined with clinical observations, patient history, and epidemiological information. The expected result is Negative. Fact Sheet for Patients: SugarRoll.be Fact Sheet for Healthcare Providers: https://www.woods-mathews.com/ This test is not yet approved or cleared by the [place] FDA and  has been authorized for detection and/or diagnosis of SARS-CoV-2 by FDA under an Emergency Use Authorization (EUA). This EUA will remain  in effect (meaning this test can be used) for the duration of the COVID-19 declaration under Section 56 4(b)(1) of the Act, 21 U.S.C. section 360bbb-3(b)(1), unless the authorization is terminated or revoked sooner. Performed at [facility], [place] [address] [place], [place] [address]          Radiology Studies: DG Chest Portable 1 View  Result Date: 08/02/2019 CLINICAL DATA:  Shortness of breath EXAM: PORTABLE CHEST 1 VIEW COMPARISON:  02/09/2018 FINDINGS: Chronic peripheral lower lung pleuroparenchymal opacities. No new consolidation or edema. Stable cardiomediastinal contours. Thoracic spine fixation hardware is again noted. IMPRESSION: No acute process in the chest. Electronically Signed   By: Macy Mis M.D.   On: 08/02/2019 09:13        Scheduled Meds: . influenza vaccine adjuvanted  0.5 mL Intramuscular Tomorrow-1000  . metoprolol tartrate  25 mg Oral BID  . pantoprazole  40 mg Oral Daily  . rosuvastatin  40 mg Oral q1800   Continuous Infusions:   LOS: 1 day    Enzo Bi, MD [facility] If 7PM-7AM, please contact night-coverage 08/03/2019, 8:59 PM

## 2019-08-03 NOTE — Progress Notes (Addendum)
Subjective:  Coughing fit this morning. Some shortness of breath. No abdominal pain. No melena, brbpr. He noticed his eyes are yellow and urine is dark brown. Just completed unit of blood.   Objective: Vital signs in last 24 hours: Temp:  [97.8 F (36.6 C)-98.8 F (37.1 C)] 97.8 F (36.6 C) (12/10 0443) Pulse Rate:  [71-86] 81 (12/10 0443) Resp:  [25-40] 33 (12/10 0443) BP: (104-134)/(48-74) 132/58 (12/10 0443) SpO2:  [97 %-100 %] 99 % (12/10 0443) Last BM Date: 08/02/19 General:   Alert,  Obese male with slight shortness of breath. Improved while interviewing. No acute distress. Head:  Normocephalic and atraumatic. Eyes:  Sclera icterus.  Chest: CTA bilaterally without rales, rhonchi, crackles.    Heart:  Regular rate and rhythm; no murmurs, clicks, rubs,  or gallops. Abdomen:  Soft, nontender and nondistended.  Extremities:  Chronic venous stasis changes. Without clubbing, deformity. Neurologic:  Alert and  oriented x4;  grossly normal neurologically. Skin:  Intact without significant lesions or rashes. Psych:  Alert and cooperative. Normal mood and affect.  Intake/Output from previous day: 12/09 0701 - 12/10 0700 In: 240 [P.O.:240] Out: 250 [Urine:250] Intake/Output this shift: No intake/output data recorded.  Lab Results: CBC Recent Labs    08/02/19 0839 08/02/19 1453 08/03/19 0333  WBC 7.7  --  6.2  HGB 7.1* 6.7* 6.3*  HCT 25.1* 23.0* 20.7*  MCV 137.2*  --  135.3*  PLT 258  --  200   BMET Recent Labs    08/02/19 0839 08/03/19 0333  NA 140 142  K 3.7 4.3  CL 107 109  CO2 21* 23  GLUCOSE 100* 101*  BUN 12 11  CREATININE 1.16 1.10  CALCIUM 8.9 8.7*   LFTs Recent Labs    08/02/19 0839 08/02/19 1453  BILITOT 5.6*  --   BILIDIR  --  0.6*  ALKPHOS 53  --   AST 25  --   ALT 12  --   PROT 7.5  --   ALBUMIN 4.2  --    No results for input(s): LIPASE in the last 72 hours. PT/INR No results for input(s): LABPROT, INR in the last 72 hours.     Imaging Studies: DG Chest Portable 1 View  Result Date: 08/02/2019 CLINICAL DATA:  Shortness of breath EXAM: PORTABLE CHEST 1 VIEW COMPARISON:  02/09/2018 FINDINGS: Chronic peripheral lower lung pleuroparenchymal opacities. No new consolidation or edema. Stable cardiomediastinal contours. Thoracic spine fixation hardware is again noted. IMPRESSION: No acute process in the chest. Electronically Signed   By: Macy Mis M.D.   On: 08/02/2019 09:13  [2 weeks]   Assessment: 68 y/o male with multiple comorbidities including HTN, Aflutter on Eliquis, diastolic heart failure, diabetes, diffuse large B-cell lymphoma status post stem cell transplant in 2007, iron deficiency anemia, chronic lower extremity edema presented to the emergency department with complaints of shortness of breath, dry cough progressively worsening over the past 2 weeks.  Noted to have profound anemia.  Macrocytic anemia: Hemoglobin 7.1, MCV 137.2 (hemoglobin 14.5 and MCV 94.7 in May 2020).  Total bilirubin elevated at 5.6 mostly indirect.  BUN 12.  No reported recent melena or rectal bleeding.  Hemoccult negative stool in the ED.  LDH elevated at 496.  Haptoglobin less than 10.  Absolute reticulocyte count 329.3.B12, folate, ferritin, iron all normal.  Findings concerning for hematological process.  Peripheral smear pending.  Patient is on Eliquis twice daily for arrhythmia, last dose 08/01/2019 at 7 PM.  Currently on hold.  History of IDA with extensive GI evaluation in 2016 including EGD, ileocolonoscopy, small bowel capsule study.  Noted to have several lesions suspicious for AVMs in the small bowel.  Patient takes ibuprofen 400 mg daily as needed for knee pain.  Has been on Eliquis.  Hemoccult negative stool this admission.  No recent overt GI bleeding.  Reported possible melena 6 to 7 months ago.  Plan: 1. Follow-up peripheral smear as available. 2. Posttransfusion CBC pending. 3. PPI orally daily. 4. Consider hematology  evaluation. 5. Currently Eliquis is on hold.   Laureen Ochs. Bernarda Caffey [facility] [phone] 12/10/20209:24 AM     LOS: 1 day

## 2019-08-03 NOTE — Plan of Care (Signed)

## 2019-08-04 ENCOUNTER — Inpatient Hospital Stay (HOSPITAL_COMMUNITY): Payer: Medicare Other

## 2019-08-04 LAB — DIRECT ANTIGLOBULIN TEST (NOT AT ARMC)
DAT, IgG: POSITIVE
DAT, complement: NEGATIVE

## 2019-08-04 LAB — LACTATE DEHYDROGENASE: LDH: 479 U/L — ABNORMAL HIGH (ref 98–192)

## 2019-08-04 LAB — URINE CULTURE: Culture: <10000 — AB

## 2019-08-04 LAB — PREPARE RBC (CROSSMATCH)

## 2019-08-04 LAB — BILIRUBIN, TOTAL: Total Bilirubin: 3.8 mg/dL — ABNORMAL HIGH (ref 0.3–1.2)

## 2019-08-04 MED ORDER — IOHEXOL 9 MG/ML PO SOLN
ORAL | Status: AC
  Administered 2019-08-04: 500 mL
  Filled 2019-08-04: qty 1000

## 2019-08-04 MED ORDER — IOHEXOL 300 MG/ML  SOLN
100.0000 mL | Freq: Once | INTRAMUSCULAR | Status: AC | PRN
  Administered 2019-08-04: 19:00:00 100 mL via INTRAVENOUS

## 2019-08-04 MED ORDER — SODIUM CHLORIDE 0.9% IV SOLUTION
Freq: Once | INTRAVENOUS | Status: AC
  Administered 2019-08-05: 06:00:00 via INTRAVENOUS

## 2019-08-04 NOTE — Care Management Important Message (Signed)
Important Message  Patient Details  Name: [name] MRN: [id number] Date of Birth: 1951/01/27   Medicare Important Message Given:  Yes(Nancy, RN will deliver to patient due to contact precaution)     Tommy Medal 08/04/2019, 4:01 PM

## 2019-08-04 NOTE — Progress Notes (Signed)
PROGRESS NOTE    [name]  LZJ:[id number] DOB: 02-06-1951 DOA: 08/02/2019 PCP: [employer], MD    Assessment & Plan:   Active Problems:   Anemia   Macrocytic anemia   [name] is a 68 y.o. male with medical history significant for hypertension, atrial flutter on Eliquis, diastolic heart failure, diabetes mellitus type 2, diffuse large B-cell lymphoma status post stem cell transplant 2007, iron deficiency anemia who presented with worsening dyspnea.   # Symptomatic macrocytic anemia likely due to hemolysis --On presentation, Hgb 7.1, MCV 137 (14.5 and 94.7 respectively back in May 2020).  HDS.  No obvious signs of GI bleed, though is on Eliquis.  Elevated bili suggests hemolysis.   --1u pRBC ordered in the ED (of note, pt's blood was difficulty to match due to abx) --Workup consistent with hemolysis. --GI consult rec no procedure PLAN:  --Order 2nd unit of pRBC given still symptomatic  --d/c PPI BID  --Hematology consult, recommend CT chest/ab/pelvis w contrast to evaluate for return of lymphoma --Repeat LDH and total bili --obtain direct comb test  # Dyspnea on exertion --Increased RR though sating well on room air.  CXR no acute finding.  No signs of volume overload or PNA.  Dyspnea likely due to anemia. --COVID neg PLAN:  --workup for anemia and transfuse PRN  # Atrial flutter on Eliquis, rate controlled --continue home metop --Hold home Eliquis for now  # DM2, not on insulin --Most recent A1c wnl. --Hold home oral agent while inpatient  # HLD --continue home statin   DVT prophylaxis: SCD/Compression stockings Code Status: Full code  Family Communication: none today  Disposition Plan: Home 1-2 days  Consults called: GI and hematology Admission status: Inpatient   Subjective: Pt still has dyspnea on exertion, but improved.  No fever, cough, chest pain, abdominal pain, N/V/D, dysuria, increased swelling.   Objective: Vitals:   08/03/19  1331 08/03/19 2128 08/04/19 0650 08/04/19 1517  BP: 103/63 110/67 (!) 134/49 112/66  Pulse: 61 74 64 77  Resp: 20 20 18 20   Temp: 98.8 F (37.1 C) 99.3 F (37.4 C)  98 F (36.7 C)  TempSrc:    Oral  SpO2: 100% 100% 99% 99%  Weight:      Height:       No intake or output data in the 24 hours ending 08/04/19 1915 Filed Weights   08/02/19 0821  Weight: 127 kg    Examination:  Constitutional: NAD, AAOx3 HEENT: conjunctivae icterus, EOMI CV: RRR no M,R,G. Distal pulses +2.  No cyanosis.   RESP: CTA B/L, normal work of breathing, on room air GI: +BS, NTND MSK: normal ROM and strength, no joint enlargement or tenderness of both UE and LE SKIN: warm, dry and intact Neuro: II - XII grossly intact.  Sensation intact Psych: Normal mood and affect.  Appropriate judgement and reason   Data Reviewed: I have personally reviewed following labs and imaging studies  CBC: Recent Labs  Lab 08/02/19 0839 08/02/19 1453 08/03/19 0333 08/03/19 1413  WBC 7.7  --  6.2  --   NEUTROABS 5.4  --   --   --   HGB 7.1* 6.7* 6.3* 7.5*  HCT 25.1* 23.0* 20.7* 25.0*  MCV 137.2*  --  135.3*  --   PLT 258  --  200  --    Basic Metabolic Panel: Recent Labs  Lab 08/02/19 0839 08/03/19 0333  NA 140 142  K 3.7 4.3  CL 107 109  CO2 21* 23  GLUCOSE 100* 101*  BUN 12 11  CREATININE 1.16 1.10  CALCIUM 8.9 8.7*   GFR: Estimated Creatinine Clearance: 89.1 mL/min (by C-G formula based on SCr of 1.1 mg/dL). Liver Function Tests: Recent Labs  Lab 08/02/19 0839 08/04/19 1458  AST 25  --   ALT 12  --   ALKPHOS 53  --   BILITOT 5.6* 3.8*  PROT 7.5  --   ALBUMIN 4.2  --    No results for input(s): LIPASE, AMYLASE in the last 168 hours. No results for input(s): AMMONIA in the last 168 hours. Coagulation Profile: No results for input(s): INR, PROTIME in the last 168 hours. Cardiac Enzymes: Recent Labs  Lab 08/02/19 1453  CKTOTAL 69   BNP (last 3 results) No results for input(s): PROBNP  in the last 8760 hours. HbA1C: No results for input(s): HGBA1C in the last 72 hours. CBG: No results for input(s): GLUCAP in the last 168 hours. Lipid Profile: No results for input(s): CHOL, HDL, LDLCALC, TRIG, CHOLHDL, LDLDIRECT in the last 72 hours. Thyroid Function Tests: No results for input(s): TSH, T4TOTAL, FREET4, T3FREE, THYROIDAB in the last 72 hours. Anemia Panel: Recent Labs    08/02/19 1453  VITAMINB12 444  FOLATE 6.7  FERRITIN 74  TIBC 410  IRON 133  RETICCTPCT 19.8*   Sepsis Labs: No results for input(s): PROCALCITON, LATICACIDVEN in the last 168 hours.  Recent Results (from the past 240 hour(s))  SARS CORONAVIRUS 2 (TAT 6-24 HRS) Nasopharyngeal Nasopharyngeal Swab     Status: None   Collection Time: 08/02/19  8:41 AM   Specimen: Nasopharyngeal Swab  Result Value Ref Range Status   SARS Coronavirus 2 NEGATIVE NEGATIVE Final    Comment: (NOTE) SARS-CoV-2 target nucleic acids are NOT DETECTED. The SARS-CoV-2 RNA is generally detectable in upper and lower respiratory specimens during the acute phase of infection. Negative results do not preclude SARS-CoV-2 infection, do not rule out co-infections with other pathogens, and should not be used as the sole basis for treatment or other patient management decisions. Negative results must be combined with clinical observations, patient history, and epidemiological information. The expected result is Negative. Fact Sheet for Patients: SugarRoll.be Fact Sheet for Healthcare Providers: https://www.woods-mathews.com/ This test is not yet approved or cleared by the [place] FDA and  has been authorized for detection and/or diagnosis of SARS-CoV-2 by FDA under an Emergency Use Authorization (EUA). This EUA will remain  in effect (meaning this test can be used) for the duration of the COVID-19 declaration under Section 56 4(b)(1) of the Act, 21 U.S.C. section 360bbb-3(b)(1),  unless the authorization is terminated or revoked sooner. Performed at [facility], [place] [address] [place], [place] [address]   Urine culture     Status: Abnormal   Collection Time: 08/02/19  8:10 PM   Specimen: Urine, Clean Catch  Result Value Ref Range Status   Specimen Description   Final    URINE, CLEAN CATCH Performed at [facility], [address] [place], [place] [address]    Special Requests   Final    NONE Performed at [facility], [address], [place], [place] [address]    Culture (A)  Final    <10,000 COLONIES/mL INSIGNIFICANT GROWTH Performed at [place] [address], [place], [place] [address]    Report Status 08/04/2019 FINAL  Final         Radiology Studies: No results found.  Scheduled Meds: . metoprolol tartrate  25 mg Oral BID  . pantoprazole  40 mg Oral Daily  . rosuvastatin  40 mg Oral q1800   Continuous Infusions:   LOS: 2 days    Enzo Bi, MD [facility] If 7PM-7AM, please contact night-coverage 08/04/2019, 7:15 PM

## 2019-08-04 NOTE — Consult Note (Signed)
[facility] NOTE  Patient Care Team: Redmond School, MD as PCP - General (Internal Medicine) Herminio Commons, MD as PCP - Cardiology (Cardiology) Gala Romney Cristopher Estimable, MD (Gastroenterology)  CHIEF COMPLAINTS/PURPOSE OF CONSULTATION: Macrocytic anemia acute onset  HISTORY OF PRESENTING ILLNESS:  [name] 68 y.o. male is being followed in my clinic for history of diffuse large B-cell lymphoma and iron deficiency anemia.  He was diagnosed in 2006 and had stem cell transplant in 2007.  He completed 2 years of Rituxan post transplant.  He has been in remission ever since.  We are following him every 6 months for labs.  Patient presented to the ER with a hemoglobin of 6.3.  With suspected hemolytic anemia.  MEDICAL HISTORY:  Past Medical History:  Diagnosis Date  . Asthma    as child  . Diabetes mellitus without complication ([place])   . DLBCL (diffuse large B cell lymphoma) ([place]) 02/28/2009  . Edema   . Heart failure, diastolic, chronic ([place])    patient denies  . Hyperlipidemia, mixed   . Hypertension   . IDA (iron deficiency anemia)   . Large cell lymphoma ([place]) 01/2005   autologous stem cell transplant 12/2005  . Obesity, morbid (more than 100 lbs over ideal weight or BMI > 40) ([facility])   . Venous insufficiency 04/29/2011    SURGICAL HISTORY: Past Surgical History:  Procedure Laterality Date  . BACK SURGERY  2006   T6 vertebrae removed/titanium placed  . CATARACT EXTRACTION W/PHACO Right 03/31/2019   Procedure: CATARACT EXTRACTION PHACO AND INTRAOCULAR LENS PLACEMENT (IOC);  Surgeon: Baruch Goldmann, MD;  Location: [facility];  Service: Ophthalmology;  Laterality: Right;  CDE: 3.21  . CATARACT EXTRACTION W/PHACO Left 04/14/2019   Procedure: CATARACT EXTRACTION PHACO AND INTRAOCULAR LENS PLACEMENT (IOC);  Surgeon: Baruch Goldmann, MD;  Location: [facility];  Service: Ophthalmology;  Laterality: Left;  left - pt knows to arrive at 7:45, CDE: 3.55  . COLONOSCOPY  11/24/2004    Polyps in the left colon ablated/removed as described above.  Two submucosal lesions consistent with lipomas as described above not  manipulated./ Normal rectum  . COLONOSCOPY  06/20/2012   MULTIPLE RECTAL AND COLONIC POLYPS  . COLONOSCOPY N/A 03/08/2015   RMR: Capacious, redundant colon. Multiple colonic and rectosigmoid polyps removed. ablated as described above. colonic lipoma abnormal appearing terminal ileum likely a variant of normal). Howeverwith history  biopsies obtained.   . ESOPHAGOGASTRODUODENOSCOPY N/A 03/08/2015   RMR: Hiatal hernia Polypoid gastric mucosa with multiple gastric polyps. largest polyp removed via snare polypectomgy hemostasis clip placed at base. Status post gastric biopsy. Status post video capsule placement.   Marland Kitchen GIVENS CAPSULE STUDY N/A 03/08/2015   Procedure: GIVENS CAPSULE STUDY;  Surgeon: Daneil Dolin, MD;  Location: [facility];  Service: Endoscopy;  Laterality: N/A;  . LIMBAL STEM CELL TRANSPLANT    . LUNG BIOPSY  6/06  . MULTIPLE TOOTH EXTRACTIONS  04/2005  . port a cath placement    . PORT-A-CATH REMOVAL  09/08/2012   Procedure: REMOVAL PORT-A-CATH;  Surgeon: Melrose Nakayama, MD;  Location: [place];  Service: Thoracic;  Laterality: N/A;    SOCIAL HISTORY: Social History   Socioeconomic History  . Marital status: Married    Spouse name: Not on file  . Number of children: 2  . Years of education: Not on file  . Highest education level: Not on file  Occupational History  . Occupation: disability due to back  Employer: UNEMPLOYED  Tobacco Use  . Smoking status: Former Smoker    Packs/day: 0.50    Years: 15.00    Pack years: 7.50    Quit date: 11/11/2002    Years since quitting: 16.7  . Smokeless tobacco: Never Used  Substance and Sexual Activity  . Alcohol use: No    Alcohol/week: 0.0 standard drinks  . Drug use: No  . Sexual activity: Yes    Birth control/protection: None  Other Topics Concern  . Not on file  Social History Narrative    Married   No regular exercise   Social Determinants of Health   Financial Resource Strain:   . Difficulty of Paying Living Expenses: Not on file  Food Insecurity:   . Worried About Charity fundraiser in the Last Year: Not on file  . Ran Out of Food in the Last Year: Not on file  Transportation Needs:   . Lack of Transportation (Medical): Not on file  . Lack of Transportation (Non-Medical): Not on file  Physical Activity:   . Days of Exercise per Week: Not on file  . Minutes of Exercise per Session: Not on file  Stress:   . Feeling of Stress : Not on file  Social Connections:   . Frequency of Communication with Friends and Family: Not on file  . Frequency of Social Gatherings with Friends and Family: Not on file  . Attends Religious Services: Not on file  . Active Member of Clubs or Organizations: Not on file  . Attends Archivist Meetings: Not on file  . Marital Status: Not on file  Intimate Partner Violence:   . Fear of Current or Ex-Partner: Not on file  . Emotionally Abused: Not on file  . Physically Abused: Not on file  . Sexually Abused: Not on file    FAMILY HISTORY: Family History  Problem Relation Age of Onset  . Cancer Mother        lung  . Cancer Father        prostate  . Colon cancer Neg Hx   . Diabetes Neg Hx     ALLERGIES:  is allergic to penicillins.  MEDICATIONS:  Current Facility-Administered Medications  Medication Dose Route Frequency Provider Last Rate Last Admin  . iohexol (OMNIPAQUE) 9 MG/ML oral solution           . metoprolol tartrate (LOPRESSOR) tablet 25 mg  25 mg Oral BID Enzo Bi, MD   25 mg at 08/04/19 1021  . pantoprazole (PROTONIX) EC tablet 40 mg  40 mg Oral Daily Rehman, Mechele Dawley, MD   40 mg at 08/04/19 1022  . rosuvastatin (CRESTOR) tablet 40 mg  40 mg Oral q1800 Enzo Bi, MD   40 mg at 08/03/19 1754    REVIEW OF SYSTEMS:   Constitutional: Denies fevers, chills or abnormal night sweats Respiratory: Denies cough,  dyspnea or wheezes Cardiovascular: Denies palpitation, chest discomfort or lower extremity swelling Gastrointestinal:  Denies nausea, heartburn or change in bowel habits Skin: Denies abnormal skin rashes Lymphatics: Denies new lymphadenopathy or easy bruising Neurological:Denies numbness, tingling or new weaknesses Behavioral/Psych: Mood is stable, no new changes  All other systems were reviewed with the patient and are negative.  PHYSICAL EXAMINATION: ECOG PERFORMANCE STATUS: 2 - Symptomatic, <50% confined to bed  Vitals:   08/03/19 2128 08/04/19 0650  BP: 110/67 (!) 134/49  Pulse: 74 64  Resp: 20 18  Temp: 99.3 F (37.4 C)   SpO2: 100% 99%  Filed Weights   08/02/19 0821  Weight: 280 lb (127 kg)    GENERAL:alert, no distress and comfortable SKIN: skin color, texture, turgor are normal, no rashes or significant lesions EYES: normal, conjunctiva are pink and non-injected, sclera clear OROPHARYNX:no exudate, no erythema and lips, buccal mucosa, and tongue normal  NECK: supple, thyroid normal size, non-tender, without nodularity LYMPH:  no palpable lymphadenopathy in the cervical, axillary or inguinal LUNGS: clear to auscultation and percussion with normal breathing effort HEART: regular rate & rhythm and no murmurs and no lower extremity edema ABDOMEN:abdomen soft, non-tender and normal bowel sounds Musculoskeletal:no cyanosis of digits and no clubbing  PSYCH: alert & oriented x 3 with fluent speech NEURO: no focal motor/sensory deficits  LABORATORY DATA:  I have reviewed the data as listed Lab Results  Component Value Date   WBC 6.2 08/03/2019   HGB 7.5 (L) 08/03/2019   HCT 25.0 (L) 08/03/2019   MCV 135.3 (H) 08/03/2019   PLT 200 08/03/2019     RADIOGRAPHIC STUDIES: I have personally reviewed the radiological images as listed and agreed with the findings in the report. DG Chest Portable 1 View  Result Date: 08/02/2019 CLINICAL DATA:  Shortness of breath EXAM:  PORTABLE CHEST 1 VIEW COMPARISON:  02/09/2018 FINDINGS: Chronic peripheral lower lung pleuroparenchymal opacities. No new consolidation or edema. Stable cardiomediastinal contours. Thoracic spine fixation hardware is again noted. IMPRESSION: No acute process in the chest. Electronically Signed   By: Macy Mis M.D.   On: 08/02/2019 09:13      ASSESSMENT & PLAN:  1.DLBLC status post stem cell transplant: -Patient was diagnosed 06/2005. -He completed R-CHOP chemotherapy with incomplete response. -He went on to have ICE chemotherapy pretransplant and total body radiation with autologous stem cell transplant on 01/15/2006. -He completed 2 years of maintenance Rituxan post transplant. -He has remained NED since that time. -He was released from the stem cell transplant clinic a few years ago. -He presented to the ER on 08/02/2019 with macrocytic anemia acute onset. -Suspected hemolytic anemia.  Hemoglobin 6.3 on admission. -He was given 1 unit of PRBC. -We will order CT CAP, LDH, reticulocytes, save smear, direct Coombs, total bilirubin, direct bilirubin. -We will consider systemic steroids if Coombs test is positive.      Orders Placed This Encounter  Procedures  . Critical Care    This order was created via procedure documentation    Standing Status:   Standing    Number of Occurrences:   1  . SARS CORONAVIRUS 2 (TAT 6-24 HRS) Nasopharyngeal Nasopharyngeal Swab    Standing Status:   Standing    Number of Occurrences:   1    Order Specific Question:   Is this test for diagnosis or screening    Answer:   Diagnosis of ill patient    Order Specific Question:   Symptomatic for COVID-19 as defined by CDC    Answer:   Yes    Order Specific Question:   Date of Symptom Onset    Answer:   07/26/2019    Order Specific Question:   Hospitalized for COVID-19    Answer:   Yes    Order Specific Question:   Admitted to ICU for COVID-19    Answer:   No    Order Specific Question:   Previously  tested for COVID-19    Answer:   No    Order Specific Question:   Resident in a congregate (group) care setting    Answer:  No    Order Specific Question:   Employed in healthcare setting    Answer:   No  . Urine culture    Standing Status:   Standing    Number of Occurrences:   1  . DG Chest Portable 1 View    Standing Status:   Standing    Number of Occurrences:   1    Order Specific Question:   Reason for Exam (SYMPTOM  OR DIAGNOSIS REQUIRED)    Answer:   sob  . CT ABDOMEN PELVIS W CONTRAST    Standing Status:   Standing    Number of Occurrences:   1    Order Specific Question:   ** REASON FOR EXAM (FREE TEXT)    Answer:   concern for lymphoma return    Order Specific Question:   Does the patient have a contrast media/X-ray dye allergy?    Answer:   No    Order Specific Question:   If indicated for the ordered procedure, I authorize the administration of contrast media per Radiology protocol    Answer:   Yes    Order Specific Question:   Is Oral Contrast requested for this exam?    Answer:   Yes, Per Radiology protocol    Order Specific Question:   Radiology Contrast Protocol - do NOT remove file path    Answer:   \\[email or web address].[email or web address]  . CT CHEST W CONTRAST    Standing Status:   Standing    Number of Occurrences:   1    Order Specific Question:   Does the patient have a contrast media/X-ray dye allergy?    Answer:   No    Order Specific Question:   If indicated for the ordered procedure, I authorize the administration of contrast media per Radiology protocol    Answer:   Yes    Order Specific Question:   Radiology Contrast Protocol - do NOT remove file path    Answer:   \\[email or web address].[email or web address]  . Comprehensive metabolic panel    Standing Status:   Standing    Number of Occurrences:   1  . CBC with Differential    Standing Status:   Standing    Number of Occurrences:   1  . Brain natriuretic peptide    Standing Status:    Standing    Number of Occurrences:   1  . Vitamin B12    Standing Status:   Standing    Number of Occurrences:   1  . Folate    Standing Status:   Standing    Number of Occurrences:   1  . Iron and TIBC    Standing Status:   Standing    Number of Occurrences:   1  . Ferritin    Standing Status:   Standing    Number of Occurrences:   1  . Reticulocytes    Standing Status:   Standing    Number of Occurrences:   1  . Urinalysis, Routine w reflex microscopic    Standing Status:   Standing    Number of Occurrences:   1  . CK    Standing Status:   Standing    Number of Occurrences:   1  . Hemoglobin and hematocrit, blood    Standing Status:   Standing    Number of Occurrences:   1  . HIV Antibody (routine testing w rflx)    Standing Status:   Standing    Number of  Occurrences:   1  . Basic metabolic panel    Standing Status:   Standing    Number of Occurrences:   1  . CBC    Standing Status:   Standing    Number of Occurrences:   1  . Haptoglobin    Standing Status:   Standing    Number of Occurrences:   1  . Pathologist smear review    Standing Status:   Standing    Number of Occurrences:   1  . Bilirubin, direct    Standing Status:   Standing    Number of Occurrences:   1  . Lactate dehydrogenase    Standing Status:   Standing    Number of Occurrences:   1  . Hemoglobin and hematocrit, blood    Standing Status:   Standing    Number of Occurrences:   1  . Bilirubin, total    Standing Status:   Standing    Number of Occurrences:   1    Order Specific Question:   Specimen collection method    Answer:   Lab=Lab collect  . Lactate dehydrogenase    Standing Status:   Standing    Number of Occurrences:   1    Order Specific Question:   Specimen collection method    Answer:   Lab=Lab collect  . Diet heart healthy/carb modified Room service appropriate? Yes; Fluid consistency: Thin    Standing Status:   Standing    Number of Occurrences:   1    Order Specific  Question:   Diet-HS Snack?    Answer:   Nothing    Order Specific Question:   Room service appropriate?    Answer:   Yes    Order Specific Question:   Fluid consistency:    Answer:   Thin  . Cardiac monitoring    Standing Status:   Standing    Number of Occurrences:   1  . Check Rectal Temperature    Standing Status:   Standing    Number of Occurrences:   1  . Vital signs    Standing Status:   Standing    Number of Occurrences:   1  . Notify physician    Standing Status:   Standing    Number of Occurrences:   20    Order Specific Question:   Notify Physician    Answer:   for pulse less than 55 or greater than 120    Order Specific Question:   Notify Physician    Answer:   for respiratory rate less than 12 or greater than 25    Order Specific Question:   Notify Physician    Answer:   for temperature greater than 100.5 F    Order Specific Question:   Notify Physician    Answer:   for urinary output less than 30 mL/hr for four hours    Order Specific Question:   Notify Physician    Answer:   for systolic BP less than 90 or greater than 539, diastolic BP less than 60 or greater than 100  . Initiate Oral Care Protocol    Standing Status:   Standing    Number of Occurrences:   1  . Initiate Carrier Fluid Protocol    Standing Status:   Standing    Number of Occurrences:   1  . RN may order General Admission PRN Orders utilizing "General Admission PRN medications" (through manage orders) for the  following patient needs: allergy symptoms (Claritin), cold sores (Carmex), cough (Robitussin DM), eye irritation (Liquifilm Tears), hemorrhoids (Tucks), indigestion (Maalox), minor skin irritation (Hydrocortisone Cream), muscle pain Suezanne Jacquet Gay), nose irritation (saline nasal spray) and sore throat (Chloraseptic spray).    Standing Status:   Standing    Number of Occurrences:   [id number]  . Patient has an active order for admit to inpatient/place in observation    Standing Status:   Standing    Number  of Occurrences:   1  . SCDs    Standing Status:   Standing    Number of Occurrences:   1    Order Specific Question:   Laterality    Answer:   Bilateral  . Full code    Standing Status:   Standing    Number of Occurrences:   1  . Consult to hospitalist  ALL PATIENTS BEING ADMITTED/HAVING PROCEDURES NEED COVID-19 SCREENING    ALL PATIENTS BEING ADMITTED/HAVING PROCEDURES NEED COVID-19 SCREENING    Standing Status:   Standing    Number of Occurrences:   1    Order Specific Question:   Place call to:    Answer:   [employer] photographer Question:   Reason for Consult    Answer:   Admit  . Consult to gastroenterology Consult Timeframe: STAT - requires a response within one hour; STAT timeframe requires provider to provider communication, has the provider to provider communication been completed: Yes; Reason for Consult? symptomatic .Marland KitchenMarland Kitchen    Please let me know if you want to do EGD today.  If not, I will let pt eat. thx    Standing Status:   Standing    Number of Occurrences:   1    Order Specific Question:   Consult Timeframe    Answer:   STAT - requires a response within one hour    Order Specific Question:   STAT timeframe requires provider to provider communication, has the provider to provider communication been completed    Answer:   Yes    Order Specific Question:   Reason for Consult?    Answer:   symptomatic anemia, Hgb 7, has been NPO since yesterday.  . Consult to hematology Consult Timeframe: ROUTINE - requires response within 24 hours; Reason for Consult? symptomatic anemia likely due to hemolysis    Standing Status:   Standing    Number of Occurrences:   1    Order Specific Question:   Consult Timeframe    Answer:   ROUTINE - requires response within 24 hours    Order Specific Question:   Reason for Consult?    Answer:   symptomatic anemia likely due to hemolysis  . POC occult blood, ED    Standing Status:   Standing    Number of Occurrences:   1  . EKG 12-Lead     Standing Status:   Standing    Number of Occurrences:   1    Order Specific Question:   Reason for Exam    Answer:   sob  . EKG 12-Lead    Standing Status:   Standing    Number of Occurrences:   1  . EKG  . Type and screen    Standing Status:   Standing    Number of Occurrences:   1  . Prepare RBC    Standing Status:   Standing    Number of Occurrences:   1    Order Specific  Question:   # of Units    Answer:   1 unit    Order Specific Question:   Transfusion Indications    Answer:   Emergent/ Trauma    Order Specific Question:   Transfusion Indications    Answer:   Symptomatic Anemia  . Direct antiglobulin test (not at [facility])    Standing Status:   Standing    Number of Occurrences:   1  . Place in observation (patient's expected length of stay will be less than 2 midnights)    Standing Status:   Standing    Number of Occurrences:   1    Order Specific Question:   Hospital Area    Answer:   [facility] [[id number]]    Order Specific Question:   Level of Care    Answer:   Med-Surg [16]    Order Specific Question:   Covid Evaluation    Answer:   Symptomatic Person Under Investigation (PUI)    Order Specific Question:   Diagnosis    Answer:   Anemia [242168]    Order Specific Question:   Admitting Physician    Answer:   Enzo Bi [[id number]]    Order Specific Question:   Attending Physician    Answer:   Enzo Bi [[id number]]    Order Specific Question:   PT Class (Do Not Modify)    Answer:   Observation [104]    Order Specific Question:   PT Acc Code (Do Not Modify)    Answer:   Observation [10022]  . Admit to Inpatient (patient's expected length of stay will be greater than 2 midnights or inpatient only procedure)    Standing Status:   Standing    Number of Occurrences:   1    Order Specific Question:   Hospital Area    Answer:   [facility] [[id number]]    Order Specific Question:   Level of Care    Answer:   Med-Surg [16]    Order Specific Question:   Covid  Evaluation    Answer:   Confirmed COVID Negative    Order Specific Question:   Diagnosis    Answer:   Symptomatic anemia [[id number]]    Order Specific Question:   Admitting Physician    Answer:   Enzo Bi [[id number]]    Order Specific Question:   Attending Physician    Answer:   Enzo Bi [[id number]]    Order Specific Question:   Estimated length of stay    Answer:   3 - 4 days    Order Specific Question:   Certification:    Answer:   I certify this patient will need inpatient services for at least 2 midnights    Order Specific Question:   PT Class (Do Not Modify)    Answer:   Inpatient [101]    Order Specific Question:   PT Acc Code (Do Not Modify)    Answer:   Private [1]    All questions were answered. The patient knows to call the clinic with any problems, questions or concerns.     Glennie Isle, NP-C 08/04/2019 2:58 PM

## 2019-08-04 NOTE — Care Management Important Message (Deleted)
Important Message  Patient Details  Name: [name] MRN: [id number] Date of Birth: 12/29/1950   Medicare Important Message Given:  Yes     Tommy Medal 08/04/2019, 2:12 PM

## 2019-08-04 NOTE — Progress Notes (Signed)
Chart reviewed. Peripheral smear remains pending. Hgb 7.5 after 1 unit PRBCs yesterday. No indication for GI intervention currently. Agree with Hematology consultation, which has been requested.

## 2019-08-05 DIAGNOSIS — D5919 Other autoimmune hemolytic anemia: Secondary | ICD-10-CM

## 2019-08-05 DIAGNOSIS — D649 Anemia, unspecified: Secondary | ICD-10-CM

## 2019-08-05 DIAGNOSIS — C8330 Diffuse large B-cell lymphoma, unspecified site: Secondary | ICD-10-CM

## 2019-08-05 LAB — BASIC METABOLIC PANEL
Anion gap: 10 (ref 5–15)
BUN: 11 mg/dL (ref 8–23)
CO2: 22 mmol/L (ref 22–32)
Calcium: 8.8 mg/dL — ABNORMAL LOW (ref 8.9–10.3)
Chloride: 106 mmol/L (ref 98–111)
Creatinine, Ser: 1.01 mg/dL (ref 0.61–1.24)
GFR calc Af Amer: >60 mL/min (ref >60)
GFR calc non Af Amer: >60 mL/min (ref >60)
Glucose, Bld: 99 mg/dL (ref 70–99)
Potassium: 3.9 mmol/L (ref 3.5–5.1)
Sodium: 138 mmol/L (ref 135–145)

## 2019-08-05 LAB — CBC
HCT: 28.2 % — ABNORMAL LOW (ref 39.0–52.0)
Hemoglobin: 8.8 g/dL — ABNORMAL LOW (ref 13.0–17.0)
MCH: 37.6 pg — ABNORMAL HIGH (ref 26.0–34.0)
MCHC: 31.2 g/dL (ref 30.0–36.0)
MCV: 120.5 fL — ABNORMAL HIGH (ref 80.0–100.0)
Platelets: 202 10*3/uL (ref 150–400)
RBC: 2.34 MIL/uL — ABNORMAL LOW (ref 4.22–5.81)
RDW: 26.7 % — ABNORMAL HIGH (ref 11.5–15.5)
WBC: 7.3 10*3/uL (ref 4.0–10.5)
nRBC: 2.2 % — ABNORMAL HIGH (ref 0.0–0.2)

## 2019-08-05 LAB — MAGNESIUM: Magnesium: 2.2 mg/dL (ref 1.7–2.4)

## 2019-08-05 MED ORDER — PREDNISONE 20 MG PO TABS
120.0000 mg | ORAL_TABLET | Freq: Once | ORAL | Status: AC
  Administered 2019-08-05: 19:00:00 120 mg via ORAL
  Filled 2019-08-05: qty 6

## 2019-08-05 MED ORDER — PREDNISONE 20 MG PO TABS
120.0000 mg | ORAL_TABLET | Freq: Every day | ORAL | Status: DC
  Administered 2019-08-06 – 2019-08-07 (×2): 120 mg via ORAL
  Filled 2019-08-05 (×2): qty 6

## 2019-08-05 NOTE — Progress Notes (Signed)
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 male with medical history significant for hypertension, atrial flutter on Eliquis, diastolic heart failure, diabetes mellitus type 2, diffuse large B-cell lymphoma status post stem cell transplant 2007, iron deficiency anemia who presented with worsening dyspnea.   # Symptomatic macrocytic anemia due to autoimmune hemolytic anemia --On presentation, Hgb 7.1, MCV 137 (14.5 and 94.7 respectively back in May 2020).  HDS.  No obvious signs of GI bleed, though is on Eliquis.  Elevated bili and LDH suggests hemolysis.  HemOnc consulted who ordered additional labs: Direct Coombs test positive for IgG complement, negative for C3d. --s/p 2u pRBC with appropriate rise in Hgb (of note, pt's blood was difficulty to match due to abx) PLAN:  --start prednisone 1 mg/kg daily --Will start rituximab weekly x4 after discharge, in the clinic --follow reticulocyte count, LDH, total and direct bilirubin on daily labs.  # Dyspnea on exertion --Increased RR though sating well on room air.  CXR no acute finding.  No signs of volume overload or PNA.  Dyspnea likely due to anemia. --COVID neg PLAN:  --tx for anemia as above  # Atrial flutter on Eliquis, rate controlled --continue home metop --Hold home Eliquis for now  # DM2, not on insulin --Most recent A1c wnl. --Hold home oral agent while inpatient  # HLD --continue home statin  Diffuse large B-cell lymphoma: -Diagnosed in November 2006, treated with R-CHOP chemotherapy with incomplete response. -He went on to receive ICE chemotherapy followed by autologous stem cell transplant on 01/15/2006. -He completed 2 years of maintenance rituximab posttreatment. -He has been NED since then.  -CT CAP on 10/04/2018 showed enlarged anterior mediastinal, pretracheal and AP window lymph nodes, some of which are calcified in the anterior mediastinum.  Pretracheal lymph node appears enlarged compared to prior exam of the chest dated 05/18/2017, measuring 1.9 x 1.5 cm.  Other lymph nodes are generally similar. -We will consider doing a PET CT scan as outpatient.  DVT prophylaxis: SCD/Compression stockings Code Status: Full code  Family Communication: none today  Disposition Plan: Home 1-2 days  Consults called: GI and hematology Admission status: Inpatient   Subjective: Still having DOE.  No fever, cough, chest pain, abdominal pain, N/V/D, dysuria, increased swelling.   Objective: Vitals:   08/04/19 2119 08/05/19 0537 08/05/19 0628 08/05/19 0936  BP: 137/63 135/60 122/74 (!) 121/45  Pulse: 76 81 76 (!) 58  Resp: 17 18 18 20   Temp: 97.7 F (36.5 C) 98.6 F (37 C) 98.6 F (37 C) 98.5 F (36.9 C)  TempSrc: Oral  Oral   SpO2: 99% 100% 99% 97%  Weight:      Height:        Intake/Output Summary (Last 24 hours) at 08/05/2019 2035 Last data filed at 08/05/2019 1815 Gross per 24 hour  Intake 842.5 ml  Output -  Net 842.5 ml   Filed Weights   08/02/19 0821  Weight: 127 kg    Examination:  Constitutional: NAD, AAOx3 HEENT: conjunctivae icterus, EOMI CV: RRR no M,R,G. Distal pulses +2.  No cyanosis.   RESP: CTA B/L, normal work of breathing, on room air, but increased RR with minimum movement GI: +BS, NTND MSK: normal ROM and  strength, no joint enlargement or tenderness of both UE and LE SKIN: warm, dry and intact Neuro: II - XII grossly intact.  Sensation intact Psych: Normal mood and affect.  Appropriate judgement and reason   Data Reviewed: I have personally reviewed following labs and imaging studies  CBC: Recent Labs  Lab 08/02/19 0839 08/02/19 1453 08/03/19 0333 08/03/19 1413 08/05/19 1309  WBC 7.7  --  6.2  --  7.3  NEUTROABS 5.4  --   --   --   --    HGB 7.1* 6.7* 6.3* 7.5* 8.8*  HCT 25.1* 23.0* 20.7* 25.0* 28.2*  MCV 137.2*  --  135.3*  --  120.5*  PLT 258  --  200  --  196   Basic Metabolic Panel: Recent Labs  Lab 08/02/19 0839 08/03/19 0333 08/05/19 1309  NA 140 142 138  K 3.7 4.3 3.9  CL 107 109 106  CO2 21* 23 22  GLUCOSE 100* 101* 99  BUN 12 11 11   CREATININE 1.16 1.10 1.01  CALCIUM 8.9 8.7* 8.8*  MG  --   --  2.2   GFR: Estimated Creatinine Clearance: 97.1 mL/min (by C-G formula based on SCr of 1.01 mg/dL). Liver Function Tests: Recent Labs  Lab 08/02/19 0839 08/04/19 1458  AST 25  --   ALT 12  --   ALKPHOS 53  --   BILITOT 5.6* 3.8*  PROT 7.5  --   ALBUMIN 4.2  --    No results for input(s): LIPASE, AMYLASE in the last 168 hours. No results for input(s): AMMONIA in the last 168 hours. Coagulation Profile: No results for input(s): INR, PROTIME in the last 168 hours. Cardiac Enzymes: Recent Labs  Lab 08/02/19 1453  CKTOTAL 69   BNP (last 3 results) No results for input(s): PROBNP in the last 8760 hours. HbA1C: No results for input(s): HGBA1C in the last 72 hours. CBG: No results for input(s): GLUCAP in the last 168 hours. Lipid Profile: No results for input(s): CHOL, HDL, LDLCALC, TRIG, CHOLHDL, LDLDIRECT in the last 72 hours. Thyroid Function Tests: No results for input(s): TSH, T4TOTAL, FREET4, T3FREE, THYROIDAB in the last 72 hours. Anemia Panel: No results for input(s): VITAMINB12, FOLATE, FERRITIN, TIBC, IRON, RETICCTPCT in the last 72 hours. Sepsis Labs: No results for input(s): PROCALCITON, LATICACIDVEN in the last 168 hours.  Recent Results (from the past 240 hour(s))  SARS CORONAVIRUS 2 (TAT 6-24 HRS) Nasopharyngeal Nasopharyngeal Swab     Status: None   Collection Time: 08/02/19  8:41 AM   Specimen: Nasopharyngeal Swab  Result Value Ref Range Status   SARS Coronavirus 2 NEGATIVE NEGATIVE Final    Comment: (NOTE) SARS-CoV-2 target nucleic acids are NOT DETECTED. The SARS-CoV-2  RNA is generally detectable in upper and lower respiratory specimens during the acute phase of infection. Negative results do not preclude SARS-CoV-2 infection, do not rule out co-infections with other pathogens, and should not be used as the sole basis for treatment or other patient management decisions. Negative results must be combined with clinical observations, patient history, and epidemiological information. The expected result is Negative. Fact Sheet for Patients: SugarRoll.be Fact Sheet for Healthcare Providers: https://www.woods-mathews.com/ This test is not yet approved or cleared by the [place] FDA and  has been authorized for detection and/or diagnosis of SARS-CoV-2 by FDA under an Emergency Use Authorization (EUA). This EUA will remain  in effect (meaning this test can be used) for the duration of the COVID-19 declaration under Section 56 4(b)(1)  of the Act, 21 U.S.C. section 360bbb-3(b)(1), unless the authorization is terminated or revoked sooner. Performed at [facility], [place] [address] [place], [place] [address]   Urine culture     Status: Abnormal   Collection Time: 08/02/19  8:10 PM   Specimen: Urine, Clean Catch  Result Value Ref Range Status   Specimen Description   Final    URINE, CLEAN CATCH Performed at [facility], [address], [place], [place] [address]    Special Requests   Final    NONE Performed at [facility], [address], [place], [place] [address]    Culture (A)  Final    <10,000 COLONIES/mL INSIGNIFICANT GROWTH Performed at [place] [address], [place],  [address]    Report Status 08/04/2019 FINAL  Final         Radiology Studies: CT CHEST W CONTRAST  Result Date: 08/04/2019 CLINICAL DATA:  Shortness of breath, chest pain, history of lymphoma in remission EXAM: CT CHEST, ABDOMEN, AND PELVIS WITH CONTRAST TECHNIQUE: Multidetector CT imaging of the chest,  abdomen and pelvis was performed following the standard protocol during bolus administration of intravenous contrast. CONTRAST:  162mL OMNIPAQUE IOHEXOL 300 MG/ML SOLN, additional oral enteric contrast COMPARISON:  CT chest angiogram, 05/18/2017, PET-CT, 08/31/2005 FINDINGS: CT CHEST FINDINGS Cardiovascular: No significant vascular findings. Cardiomegaly. Scattered coronary artery calcifications. No pericardial effusion. Mediastinum/Nodes: There are enlarged anterior mediastinal, pretracheal, and AP window lymph nodes, some of which are calcified in the anterior mediastinum. A pretracheal lymph node appears enlarged compared to prior examination, measuring 1.9 x 1.5 cm (series 3, image 14). Other lymph nodes are generally similar. Thyroid gland, trachea, and esophagus demonstrate no significant findings. Lungs/Pleura: Moderate right, small left pleural effusions, which appear loculated. There is associated partial atelectasis and/or scarring of the dependent bilateral lungs. Musculoskeletal: Corpectomy of T6. CT ABDOMEN PELVIS FINDINGS Hepatobiliary: No solid liver abnormality is seen. No gallstones, gallbladder wall thickening, or biliary dilatation. Pancreas: Unremarkable. No pancreatic ductal dilatation or surrounding inflammatory changes. Spleen: Normal in size without significant abnormality. Adrenals/Urinary Tract: Adrenal glands are unremarkable. Kidneys are normal, without renal calculi, solid lesion, or hydronephrosis. Bladder is unremarkable. Stomach/Bowel: Stomach is within normal limits. Appendix appears normal. No evidence of bowel wall thickening, distention, or inflammatory changes. Vascular/Lymphatic: Aortic atherosclerosis. There is periaortic soft tissue thickening, which is new compared to prior examination dated 08/31/2005 and generally most consistent with post treatment appearance of lymphadenopathy (series 3, image 71). There are no discretely enlarged lymph nodes in the abdomen or pelvis.  Reproductive: No mass or other abnormality. Other: No abdominal wall hernia or abnormality. No abdominopelvic ascites. Musculoskeletal: No acute or significant osseous findings. IMPRESSION: 1. Moderate right, small left pleural effusions, which appear loculated. Associated partial atelectasis and/or scarring of the dependent bilateral lungs. 2. There are enlarged anterior mediastinal, pretracheal, and AP window lymph nodes, some of which are calcified in the anterior mediastinum. A pretracheal lymph node appears enlarged compared to prior examination of the chest dated 05/18/2017, measuring 1.9 x 1.5 cm (series 3, image 14). Other lymph nodes are generally similar. No definite evidence of recurrent lymphoma. PET-CT may be helpful to assess for metabolic activity and recurrent malignancy if generally suspected based upon clinical presentation. 3. Retroperitoneal soft tissue thickening, which is new compared to most recent imaging of the abdomen and pelvis dated 08/31/2005 and generally most consistent with post treatment appearance of lymphadenopathy. 4. Coronary artery disease. Aortic Atherosclerosis (ICD10-I70.0). Electronically Signed  By: Eddie Candle M.D.   On: 08/04/2019 21:34   CT ABDOMEN PELVIS W CONTRAST  Result Date: 08/04/2019 CLINICAL DATA:  Shortness of breath, chest pain, history of lymphoma in remission EXAM: CT CHEST, ABDOMEN, AND PELVIS WITH CONTRAST TECHNIQUE: Multidetector CT imaging of the chest, abdomen and pelvis was performed following the standard protocol during bolus administration of intravenous contrast. CONTRAST:  12mL OMNIPAQUE IOHEXOL 300 MG/ML SOLN, additional oral enteric contrast COMPARISON:  CT chest angiogram, 05/18/2017, PET-CT, 08/31/2005 FINDINGS: CT CHEST FINDINGS Cardiovascular: No significant vascular findings. Cardiomegaly. Scattered coronary artery calcifications. No pericardial effusion. Mediastinum/Nodes: There are enlarged anterior mediastinal, pretracheal, and AP  window lymph nodes, some of which are calcified in the anterior mediastinum. A pretracheal lymph node appears enlarged compared to prior examination, measuring 1.9 x 1.5 cm (series 3, image 14). Other lymph nodes are generally similar. Thyroid gland, trachea, and esophagus demonstrate no significant findings. Lungs/Pleura: Moderate right, small left pleural effusions, which appear loculated. There is associated partial atelectasis and/or scarring of the dependent bilateral lungs. Musculoskeletal: Corpectomy of T6. CT ABDOMEN PELVIS FINDINGS Hepatobiliary: No solid liver abnormality is seen. No gallstones, gallbladder wall thickening, or biliary dilatation. Pancreas: Unremarkable. No pancreatic ductal dilatation or surrounding inflammatory changes. Spleen: Normal in size without significant abnormality. Adrenals/Urinary Tract: Adrenal glands are unremarkable. Kidneys are normal, without renal calculi, solid lesion, or hydronephrosis. Bladder is unremarkable. Stomach/Bowel: Stomach is within normal limits. Appendix appears normal. No evidence of bowel wall thickening, distention, or inflammatory changes. Vascular/Lymphatic: Aortic atherosclerosis. There is periaortic soft tissue thickening, which is new compared to prior examination dated 08/31/2005 and generally most consistent with post treatment appearance of lymphadenopathy (series 3, image 71). There are no discretely enlarged lymph nodes in the abdomen or pelvis. Reproductive: No mass or other abnormality. Other: No abdominal wall hernia or abnormality. No abdominopelvic ascites. Musculoskeletal: No acute or significant osseous findings. IMPRESSION: 1. Moderate right, small left pleural effusions, which appear loculated. Associated partial atelectasis and/or scarring of the dependent bilateral lungs. 2. There are enlarged anterior mediastinal, pretracheal, and AP window lymph nodes, some of which are calcified in the anterior mediastinum. A pretracheal lymph  node appears enlarged compared to prior examination of the chest dated 05/18/2017, measuring 1.9 x 1.5 cm (series 3, image 14). Other lymph nodes are generally similar. No definite evidence of recurrent lymphoma. PET-CT may be helpful to assess for metabolic activity and recurrent malignancy if generally suspected based upon clinical presentation. 3. Retroperitoneal soft tissue thickening, which is new compared to most recent imaging of the abdomen and pelvis dated 08/31/2005 and generally most consistent with post treatment appearance of lymphadenopathy. 4. Coronary artery disease. Aortic Atherosclerosis (ICD10-I70.0). Electronically Signed   By: Eddie Candle M.D.   On: 08/04/2019 21:34        Scheduled Meds: . metoprolol tartrate  25 mg Oral BID  . pantoprazole  40 mg Oral Daily  . [START ON 08/06/2019] predniSONE  120 mg Oral Q breakfast  . rosuvastatin  40 mg Oral q1800   Continuous Infusions:   LOS: 3 days    Enzo Bi, MD [facility] If 7PM-7AM, please contact night-coverage 08/05/2019, 8:35 PM

## 2019-08-05 NOTE — Progress Notes (Signed)
UNMATCHED BLOOD PRODUCT NOTE  Compare the patient ID on the blood tag to the patient ID on the hospital armband and Blood Bank armband. Then confirm the unit number on the blood tag matches the unit number on the blood product.  If a discrepancy is discovered return the product to blood bank immediately.   Blood Product Type: Packed Red Blood Cells  Unit #: (Found on blood product bag, begins with W) [id number]   Product Code #:  [id number]    Start Time: 8676   Starting Rate: 120  Rate increase/decreased  (if applicable):  Na     ml/hr  Rate changed time (if applicable): NA   Stop Time:    All Other Documentation should be documented within the Blood Admin Flowsheet per policy.

## 2019-08-05 NOTE — Progress Notes (Signed)
PRBC complete with no adverse reaction

## 2019-08-05 NOTE — Consult Note (Signed)
[facility] Consultation Oncology  Name: [name]      MRN: [id number]    Location: N277/O242-35  Date: 08/05/2019 Time:5:02 PM   REFERRING PHYSICIAN: Dr. Billie Ruddy  REASON FOR CONSULT: Macrocytic anemia   DIAGNOSIS: Autoimmune hemolytic anemia  HISTORY OF PRESENT ILLNESS: [name] is a 68 year old very pleasant African-American male who is seen in consultation today for further work-up and management of sudden onset macrocytic anemia.  Labs on 08/03/2019 showed hemoglobin 6.3 with MCV of 135.3.  LDH was found to be elevated at 479.  Total bilirubin was 3.8.  He received 1 unit of PRBC as he had multiple antibodies.  His hemoglobin improved to 7.5 on 08/03/2019.  As he was having shortness of breath, another unit was transfused this morning.  Hemoglobin is 8.8.  Direct Coombs test was positive for IgG and negative for complement.  Haptoglobin was less than 10.  Reticulocyte was 13%.  This patient has a history of diffuse large B-cell lymphoma diagnosed in November 2006, completed R-CHOP chemotherapy with incomplete response.  He went on to have ice chemotherapy followed by autologous stem cell transplant on 01/15/2006.  He has been NED since then.  He denied any fevers, night sweats or weight loss in the last 6 months.  No recent new medications or antibiotic use.  A CT scan of the chest, abdomen and pelvis was also performed which showed moderate right and small left pleural effusions, which appear loculated.  Associated partial atelectasis.  There are enlarged anterior mediastinal, pretracheal and AP window lymph nodes, some of which are calcified in the anterior mediastinum.  Pretracheal lymph node appears enlarged compared to prior exam of the chest dated 05/18/2017, measuring 1.9 x 1.5 cm.  Retroperitoneal soft tissue thickening, new compared to most recent imaging in 2007, generally consistent with posttreatment appearance of lymphadenopathy.  Denies any bleeding per rectum or melena.  PAST  MEDICAL HISTORY:   Past Medical History:  Diagnosis Date  . Asthma    as child  . Diabetes mellitus without complication ([place])   . DLBCL (diffuse large B cell lymphoma) ([place]) 02/28/2009  . Edema   . Heart failure, diastolic, chronic ([place])    patient denies  . Hyperlipidemia, mixed   . Hypertension   . IDA (iron deficiency anemia)   . Large cell lymphoma ([place]) 01/2005   autologous stem cell transplant 12/2005  . Obesity, morbid (more than 100 lbs over ideal weight or BMI > 40) ([facility])   . Venous insufficiency 04/29/2011    ALLERGIES: Allergies  Allergen Reactions  . Penicillins Rash    Has patient had a PCN reaction causing immediate rash, facial/tongue/throat swelling, SOB or lightheadedness with hypotension: Unknown Has patient had a PCN reaction causing severe rash involving mucus membranes or skin necrosis: Unknown Has patient had a PCN reaction that required hospitalization: Unknown Has patient had a PCN reaction occurring within the last 10 years: No If all of the above answers are "NO", then may proceed with Cephalosporin use.       MEDICATIONS: I have reviewed the patient's current medications.     PAST SURGICAL HISTORY Past Surgical History:  Procedure Laterality Date  . BACK SURGERY  2006   T6 vertebrae removed/titanium placed  . CATARACT EXTRACTION W/PHACO Right 03/31/2019   Procedure: CATARACT EXTRACTION PHACO AND INTRAOCULAR LENS PLACEMENT (IOC);  Surgeon: Baruch Goldmann, MD;  Location: [facility];  Service: Ophthalmology;  Laterality: Right;  CDE: 3.21  . CATARACT EXTRACTION W/PHACO Left 04/14/2019  Procedure: CATARACT EXTRACTION PHACO AND INTRAOCULAR LENS PLACEMENT (IOC);  Surgeon: Baruch Goldmann, MD;  Location: [facility];  Service: Ophthalmology;  Laterality: Left;  left - pt knows to arrive at 7:45, CDE: 3.55  . COLONOSCOPY  11/24/2004   Polyps in the left colon ablated/removed as described above.  Two submucosal lesions consistent with lipomas as described above not   manipulated./ Normal rectum  . COLONOSCOPY  06/20/2012   MULTIPLE RECTAL AND COLONIC POLYPS  . COLONOSCOPY N/A 03/08/2015   RMR: Capacious, redundant colon. Multiple colonic and rectosigmoid polyps removed. ablated as described above. colonic lipoma abnormal appearing terminal ileum likely a variant of normal). Howeverwith history  biopsies obtained.   . ESOPHAGOGASTRODUODENOSCOPY N/A 03/08/2015   RMR: Hiatal hernia Polypoid gastric mucosa with multiple gastric polyps. largest polyp removed via snare polypectomgy hemostasis clip placed at base. Status post gastric biopsy. Status post video capsule placement.   Marland Kitchen GIVENS CAPSULE STUDY N/A 03/08/2015   Procedure: GIVENS CAPSULE STUDY;  Surgeon: Daneil Dolin, MD;  Location: [facility];  Service: Endoscopy;  Laterality: N/A;  . LIMBAL STEM CELL TRANSPLANT    . LUNG BIOPSY  6/06  . MULTIPLE TOOTH EXTRACTIONS  04/2005  . port a cath placement    . PORT-A-CATH REMOVAL  09/08/2012   Procedure: REMOVAL PORT-A-CATH;  Surgeon: Melrose Nakayama, MD;  Location: [facility];  Service: Thoracic;  Laterality: N/A;    FAMILY HISTORY: Family History  Problem Relation Age of Onset  . Cancer Mother        lung  . Cancer Father        prostate  . Colon cancer Neg Hx   . Diabetes Neg Hx     SOCIAL HISTORY:  reports that he quit smoking about 16 years ago. He has a 7.50 pack-year smoking history. He has never used smokeless tobacco. He reports that he does not drink alcohol or use drugs.  PERFORMANCE STATUS: The patient's performance status is 1 - Symptomatic but completely ambulatory  PHYSICAL EXAM: Most Recent Vital Signs: Blood pressure (!) 121/45, pulse (!) 58, temperature 98.5 F (36.9 C), resp. rate 20, height 5' 11.5" (1.816 m), weight 280 lb (127 kg), SpO2 97 %. BP (!) 121/45   Pulse (!) 58   Temp 98.5 F (36.9 C)   Resp 20   Ht 5' 11.5" (1.816 m)   Wt 280 lb (127 kg)   SpO2 97%   BMI 38.51 kg/m  General appearance: alert, cooperative  and appears stated age Neck: no adenopathy Lungs: clear to auscultation bilaterally and Decreased breath sounds at bases. Heart: regular rate and rhythm Abdomen: soft, non-tender; bowel sounds normal; no masses,  no organomegaly Extremities: 2+ edema bilaterally. Skin: Skin color, texture, turgor normal. No rashes or lesions Lymph nodes: Cervical, supraclavicular, and axillary nodes normal. Neurologic: Grossly normal  LABORATORY DATA:  Results for orders placed or performed during the hospital encounter of 08/02/19 (from the past 48 hour(s))  Bilirubin, total     Status: Abnormal   Collection Time: 08/04/19  2:58 PM  Result Value Ref Range   Total Bilirubin 3.8 (H) 0.3 - 1.2 mg/dL    Comment: Performed at [facility], [address] [place], [place] [address]  Lactate dehydrogenase     Status: Abnormal   Collection Time: 08/04/19  2:58 PM  Result Value Ref Range   LDH 479 (H) 98 - 192 U/L    Comment: Performed at [facility], [address], [place], [place] [address]  Direct antiglobulin test (not at [facility])     Status: None   Collection Time: 08/04/19  2:58 PM  Result Value Ref Range   DAT, complement NEG    DAT, IgG      POS Performed at [facility], [place] [address], [place], [place] [address]   CBC     Status: Abnormal   Collection Time: 08/05/19  1:09 PM  Result Value Ref Range   WBC 7.3 4.0 - 10.5 K/uL   RBC 2.34 (L) 4.22 - 5.81 MIL/uL   Hemoglobin 8.8 (L) 13.0 - 17.0 g/dL   HCT 28.2 (L) 39.0 - 52.0 %   MCV 120.5 (H) 80.0 - 100.0 fL   MCH 37.6 (H) 26.0 - 34.0 pg   MCHC 31.2 30.0 - 36.0 g/dL   RDW 26.7 (H) 11.5 - 15.5 %   Platelets 202 150 - 400 K/uL   nRBC 2.2 (H) 0.0 - 0.2 %    Comment: Performed at [facility], [address] [place], [place] [address]  Basic metabolic panel     Status: Abnormal   Collection Time: 08/05/19  1:09 PM  Result Value Ref Range   Sodium 138 135 - 145 mmol/L   Potassium 3.9 3.5 - 5.1 mmol/L   Chloride 106 98 - 111 mmol/L   CO2  22 22 - 32 mmol/L   Glucose, Bld 99 70 - 99 mg/dL   BUN 11 8 - 23 mg/dL   Creatinine, Ser 1.01 0.61 - 1.24 mg/dL   Calcium 8.8 (L) 8.9 - 10.3 mg/dL   GFR calc non Af Amer >60 >60 mL/min   GFR calc Af Amer >60 >60 mL/min   Anion gap 10 5 - 15    Comment: Performed at [facility], [address], [place], [place] [address]  Magnesium     Status: None   Collection Time: 08/05/19  1:09 PM  Result Value Ref Range   Magnesium 2.2 1.7 - 2.4 mg/dL    Comment: Performed at [facility], [address] [place], [place] [address]      RADIOGRAPHY: CT CHEST W CONTRAST  Result Date: 08/04/2019 CLINICAL DATA:  Shortness of breath, chest pain, history of lymphoma in remission EXAM: CT CHEST, ABDOMEN, AND PELVIS WITH CONTRAST TECHNIQUE: Multidetector CT imaging of the chest, abdomen and pelvis was performed following the standard protocol during bolus administration of intravenous contrast. CONTRAST:  156mL OMNIPAQUE IOHEXOL 300 MG/ML SOLN, additional oral enteric contrast COMPARISON:  CT chest angiogram, 05/18/2017, PET-CT, 08/31/2005 FINDINGS: CT CHEST FINDINGS Cardiovascular: No significant vascular findings. Cardiomegaly. Scattered coronary artery calcifications. No pericardial effusion. Mediastinum/Nodes: There are enlarged anterior mediastinal, pretracheal, and AP window lymph nodes, some of which are calcified in the anterior mediastinum. A pretracheal lymph node appears enlarged compared to prior examination, measuring 1.9 x 1.5 cm (series 3, image 14). Other lymph nodes are generally similar. Thyroid gland, trachea, and esophagus demonstrate no significant findings. Lungs/Pleura: Moderate right, small left pleural effusions, which appear loculated. There is associated partial atelectasis and/or scarring of the dependent bilateral lungs. Musculoskeletal: Corpectomy of T6. CT ABDOMEN PELVIS FINDINGS Hepatobiliary: No solid liver abnormality is seen. No gallstones, gallbladder wall thickening, or biliary  dilatation. Pancreas: Unremarkable. No pancreatic ductal dilatation or surrounding inflammatory changes. Spleen: Normal in size without significant abnormality. Adrenals/Urinary Tract: Adrenal glands are unremarkable. Kidneys are normal, without renal calculi, solid lesion, or hydronephrosis. Bladder is unremarkable. Stomach/Bowel: Stomach is within normal limits. Appendix appears normal. No evidence of bowel wall  thickening, distention, or inflammatory changes. Vascular/Lymphatic: Aortic atherosclerosis. There is periaortic soft tissue thickening, which is new compared to prior examination dated 08/31/2005 and generally most consistent with post treatment appearance of lymphadenopathy (series 3, image 71). There are no discretely enlarged lymph nodes in the abdomen or pelvis. Reproductive: No mass or other abnormality. Other: No abdominal wall hernia or abnormality. No abdominopelvic ascites. Musculoskeletal: No acute or significant osseous findings. IMPRESSION: 1. Moderate right, small left pleural effusions, which appear loculated. Associated partial atelectasis and/or scarring of the dependent bilateral lungs. 2. There are enlarged anterior mediastinal, pretracheal, and AP window lymph nodes, some of which are calcified in the anterior mediastinum. A pretracheal lymph node appears enlarged compared to prior examination of the chest dated 05/18/2017, measuring 1.9 x 1.5 cm (series 3, image 14). Other lymph nodes are generally similar. No definite evidence of recurrent lymphoma. PET-CT may be helpful to assess for metabolic activity and recurrent malignancy if generally suspected based upon clinical presentation. 3. Retroperitoneal soft tissue thickening, which is new compared to most recent imaging of the abdomen and pelvis dated 08/31/2005 and generally most consistent with post treatment appearance of lymphadenopathy. 4. Coronary artery disease. Aortic Atherosclerosis (ICD10-I70.0). Electronically Signed   By:  Eddie Candle M.D.   On: 08/04/2019 21:34   CT ABDOMEN PELVIS W CONTRAST  Result Date: 08/04/2019 CLINICAL DATA:  Shortness of breath, chest pain, history of lymphoma in remission EXAM: CT CHEST, ABDOMEN, AND PELVIS WITH CONTRAST TECHNIQUE: Multidetector CT imaging of the chest, abdomen and pelvis was performed following the standard protocol during bolus administration of intravenous contrast. CONTRAST:  11mL OMNIPAQUE IOHEXOL 300 MG/ML SOLN, additional oral enteric contrast COMPARISON:  CT chest angiogram, 05/18/2017, PET-CT, 08/31/2005 FINDINGS: CT CHEST FINDINGS Cardiovascular: No significant vascular findings. Cardiomegaly. Scattered coronary artery calcifications. No pericardial effusion. Mediastinum/Nodes: There are enlarged anterior mediastinal, pretracheal, and AP window lymph nodes, some of which are calcified in the anterior mediastinum. A pretracheal lymph node appears enlarged compared to prior examination, measuring 1.9 x 1.5 cm (series 3, image 14). Other lymph nodes are generally similar. Thyroid gland, trachea, and esophagus demonstrate no significant findings. Lungs/Pleura: Moderate right, small left pleural effusions, which appear loculated. There is associated partial atelectasis and/or scarring of the dependent bilateral lungs. Musculoskeletal: Corpectomy of T6. CT ABDOMEN PELVIS FINDINGS Hepatobiliary: No solid liver abnormality is seen. No gallstones, gallbladder wall thickening, or biliary dilatation. Pancreas: Unremarkable. No pancreatic ductal dilatation or surrounding inflammatory changes. Spleen: Normal in size without significant abnormality. Adrenals/Urinary Tract: Adrenal glands are unremarkable. Kidneys are normal, without renal calculi, solid lesion, or hydronephrosis. Bladder is unremarkable. Stomach/Bowel: Stomach is within normal limits. Appendix appears normal. No evidence of bowel wall thickening, distention, or inflammatory changes. Vascular/Lymphatic: Aortic  atherosclerosis. There is periaortic soft tissue thickening, which is new compared to prior examination dated 08/31/2005 and generally most consistent with post treatment appearance of lymphadenopathy (series 3, image 71). There are no discretely enlarged lymph nodes in the abdomen or pelvis. Reproductive: No mass or other abnormality. Other: No abdominal wall hernia or abnormality. No abdominopelvic ascites. Musculoskeletal: No acute or significant osseous findings. IMPRESSION: 1. Moderate right, small left pleural effusions, which appear loculated. Associated partial atelectasis and/or scarring of the dependent bilateral lungs. 2. There are enlarged anterior mediastinal, pretracheal, and AP window lymph nodes, some of which are calcified in the anterior mediastinum. A pretracheal lymph node appears enlarged compared to prior examination of the chest dated 05/18/2017, measuring 1.9 x 1.5 cm (series 3, image  14). Other lymph nodes are generally similar. No definite evidence of recurrent lymphoma. PET-CT may be helpful to assess for metabolic activity and recurrent malignancy if generally suspected based upon clinical presentation. 3. Retroperitoneal soft tissue thickening, which is new compared to most recent imaging of the abdomen and pelvis dated 08/31/2005 and generally most consistent with post treatment appearance of lymphadenopathy. 4. Coronary artery disease. Aortic Atherosclerosis (ICD10-I70.0). Electronically Signed   By: Eddie Candle M.D.   On: 08/04/2019 21:34       ASSESSMENT and PLAN:  1.  Acute onset macrocytic anemia: -Presentation with hemoglobin of 6.5, elevated LDH, elevated reticulocyte count, elevated indirect fraction of bilirubin. -No evidence of GI bleeding.  No new medications. -Direct Coombs test positive for IgG complement, negative for C3d -The constellation of labs consistent with autoimmune hemolytic anemia. -He received 2 units of PRBC.  He does have lots of antibodies.  His  hemoglobin posttransfusion was 8.8. -I have talked to him about the starting him on prednisone 1 mg/kg daily.  I would also recommend rituximab weekly x4.  However we will do rituximab once he is discharged in the clinic. -Please follow reticulocyte count, LDH, total and direct bilirubin on daily labs.  2.  Diffuse large B-cell lymphoma: -Diagnosed in November 2006, treated with R-CHOP chemotherapy with incomplete response. -He went on to receive ICE chemotherapy followed by autologous stem cell transplant on 01/15/2006. -He completed 2 years of maintenance rituximab posttreatment. -He has been NED since then. -CT CAP on 10/04/2018 showed enlarged anterior mediastinal, pretracheal and AP window lymph nodes, some of which are calcified in the anterior mediastinum.  Pretracheal lymph node appears enlarged compared to prior exam of the chest dated 05/18/2017, measuring 1.9 x 1.5 cm.  Other lymph nodes are generally similar. -We will consider doing a PET CT scan as outpatient.  All questions were answered. The patient knows to call the clinic with any problems, questions or concerns. We can certainly see the patient much sooner if necessary.    Derek Jack

## 2019-08-06 LAB — BPAM RBC
Blood Product Expiration Date: 202012312359
Blood Product Expiration Date: 202012312359
ISSUE DATE / TIME: 202012100423
ISSUE DATE / TIME: 202012100423
Unit Type and Rh: 6200
Unit Type and Rh: 6200

## 2019-08-06 LAB — TYPE AND SCREEN
ABO/RH(D): A POS
Antibody Screen: POSITIVE
DAT, IgG: POSITIVE
Unit division: 0
Unit division: 0

## 2019-08-06 LAB — HEPATIC FUNCTION PANEL
ALT: 12 U/L (ref 0–44)
AST: 24 U/L (ref 15–41)
Albumin: 3.8 g/dL (ref 3.5–5.0)
Alkaline Phosphatase: 54 U/L (ref 38–126)
Bilirubin, Direct: 0.5 mg/dL — ABNORMAL HIGH (ref 0.0–0.2)
Indirect Bilirubin: 3.1 mg/dL — ABNORMAL HIGH (ref 0.3–0.9)
Total Bilirubin: 3.6 mg/dL — ABNORMAL HIGH (ref 0.3–1.2)
Total Protein: 6.9 g/dL (ref 6.5–8.1)

## 2019-08-06 LAB — RETICULOCYTES
Immature Retic Fract: 38.4 % — ABNORMAL HIGH (ref 2.3–15.9)
RBC.: 2.52 MIL/uL — ABNORMAL LOW (ref 4.22–5.81)
Retic Count, Absolute: 452.3 10*3/uL — ABNORMAL HIGH (ref 19.0–186.0)
Retic Ct Pct: 18 % — ABNORMAL HIGH (ref 0.4–3.1)

## 2019-08-06 LAB — GLUCOSE, CAPILLARY
Glucose-Capillary: 130 mg/dL — ABNORMAL HIGH (ref 70–99)
Glucose-Capillary: 139 mg/dL — ABNORMAL HIGH (ref 70–99)

## 2019-08-06 LAB — LACTATE DEHYDROGENASE: LDH: 528 U/L — ABNORMAL HIGH (ref 98–192)

## 2019-08-06 NOTE — Progress Notes (Signed)
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 AA male with medical history significant for hypertension, atrial flutter on Eliquis, diastolic heart failure, diabetes mellitus type 2, diffuse large B-cell lymphoma status post stem cell transplant 2007, iron deficiency anemia who presented with worsening dyspnea.   # Symptomatic macrocytic anemia due to autoimmune hemolytic anemia --On presentation, Hgb 7.1, MCV 137 (14.5 and 94.7 respectively back in May 2020).  HDS.  No obvious signs of GI bleed, though is on Eliquis.  Elevated bili and LDH suggests hemolysis.  HemOnc consulted who ordered additional labs: Direct Coombs test positive for IgG complement, negative for C3d. --s/p 2u pRBC with appropriate rise in Hgb (of note, pt's blood was difficulty to match due to abx) --started prednisone 1 mg/kg daily on 12/12 by HemOnc. PLAN:  --continue high-dose prednisone --Will start rituximab weekly x4 after discharge, in the clinic --follow reticulocyte count, LDH, total and direct bilirubin on daily labs.  # Dyspnea on exertion --Increased RR though sating well on room air.  CXR no acute finding.  No signs of volume overload or PNA.  Recent CT chest showed small to mod pleural effusion, likely loculated.   --COVID neg PLAN:  --tx for anemia as above --IR thoracentesis tomorrow with labs  # Atrial flutter on Eliquis, rate controlled --continue home metop --Hold home Eliquis for now  # DM2, not on insulin --Most recent A1c wnl. --Hold home oral agent while inpatient  # HLD --continue home statin  Diffuse large B-cell lymphoma: -Diagnosed in November 2006, treated with R-CHOP chemotherapy with incomplete response. -He went on to receive ICE chemotherapy followed  by autologous stem cell transplant on 01/15/2006. -He completed 2 years of maintenance rituximab posttreatment. -He has been NED since then. -CT CAP on 10/04/2018 showed enlarged anterior mediastinal, pretracheal and AP window lymph nodes, some of which are calcified in the anterior mediastinum.  Pretracheal lymph node appears enlarged compared to prior exam of the chest dated 05/18/2017, measuring 1.9 x 1.5 cm.  Other lymph nodes are generally similar. -We will consider doing a PET CT scan as outpatient.  DVT prophylaxis: SCD/Compression stockings Code Status: Full code  Family Communication: none today  Disposition Plan: Home tomorrow Consults called: GI and hematology Admission status: Inpatient   Subjective: Started high-dose steroid treatment yesterday.  Pt reported not much change, no complaints.  Still has dyspnea with minimum exertion.  No fever, chest pain, abdominal pain, N/V/D, dysuria, increased swelling.    Objective: Vitals:   08/05/19 0936 08/05/19 2339 08/06/19 0546 08/06/19 1522  BP: (!) 121/45 128/74 132/67 131/62  Pulse: (!) 58 66 77 66  Resp: 20  17 18   Temp: 98.5 F (36.9 C) 98.1 F (36.7 C) 97.8 F (36.6 C) 98.9 F (37.2 C)  TempSrc:  Oral  Oral  SpO2: 97% 100% 95% 100%  Weight:      Height:       No intake or output data in the 24 hours ending 08/06/19 2125 Filed Weights   08/02/19 0821  Weight: 127 kg    Examination:  Constitutional: NAD, AAOx3 HEENT: conjunctivae cleared, EOMI CV: RRR no M,R,G. Distal pulses +2.  No cyanosis.   RESP: CTA B/L, normal work of breathing,  on room air, but increased RR with minimum movement GI: +BS, NTND MSK: normal ROM and strength, no joint enlargement or tenderness of both UE and LE SKIN: warm, dry, scaly crusted skin in BLE. Neuro: II - XII grossly intact.  Sensation intact Psych: Normal mood and affect.  Appropriate judgement and reason   Data Reviewed: I have personally reviewed following labs and imaging  studies  CBC: Recent Labs  Lab 08/02/19 0839 08/02/19 1453 08/03/19 0333 08/03/19 1413 08/05/19 1309  WBC 7.7  --  6.2  --  7.3  NEUTROABS 5.4  --   --   --   --   HGB 7.1* 6.7* 6.3* 7.5* 8.8*  HCT 25.1* 23.0* 20.7* 25.0* 28.2*  MCV 137.2*  --  135.3*  --  120.5*  PLT 258  --  200  --  397   Basic Metabolic Panel: Recent Labs  Lab 08/02/19 0839 08/03/19 0333 08/05/19 1309  NA 140 142 138  K 3.7 4.3 3.9  CL 107 109 106  CO2 21* 23 22  GLUCOSE 100* 101* 99  BUN 12 11 11   CREATININE 1.16 1.10 1.01  CALCIUM 8.9 8.7* 8.8*  MG  --   --  2.2   GFR: Estimated Creatinine Clearance: 97.1 mL/min (by C-G formula based on SCr of 1.01 mg/dL). Liver Function Tests: Recent Labs  Lab 08/02/19 0839 08/04/19 1458 08/06/19 0700  AST 25  --  24  ALT 12  --  12  ALKPHOS 53  --  54  BILITOT 5.6* 3.8* 3.6*  PROT 7.5  --  6.9  ALBUMIN 4.2  --  3.8   No results for input(s): LIPASE, AMYLASE in the last 168 hours. No results for input(s): AMMONIA in the last 168 hours. Coagulation Profile: No results for input(s): INR, PROTIME in the last 168 hours. Cardiac Enzymes: Recent Labs  Lab 08/02/19 1453  CKTOTAL 69   BNP (last 3 results) No results for input(s): PROBNP in the last 8760 hours. HbA1C: No results for input(s): HGBA1C in the last 72 hours. CBG: Recent Labs  Lab 08/06/19 0006  GLUCAP 130*   Lipid Profile: No results for input(s): CHOL, HDL, LDLCALC, TRIG, CHOLHDL, LDLDIRECT in the last 72 hours. Thyroid Function Tests: No results for input(s): TSH, T4TOTAL, FREET4, T3FREE, THYROIDAB in the last 72 hours. Anemia Panel: Recent Labs    08/06/19 0700  RETICCTPCT 18.0*   Sepsis Labs: No results for input(s): PROCALCITON, LATICACIDVEN in the last 168 hours.  Recent Results (from the past 240 hour(s))  SARS CORONAVIRUS 2 (TAT 6-24 HRS) Nasopharyngeal Nasopharyngeal Swab     Status: None   Collection Time: 08/02/19  8:41 AM   Specimen: Nasopharyngeal Swab    Result Value Ref Range Status   SARS Coronavirus 2 NEGATIVE NEGATIVE Final    Comment: (NOTE) SARS-CoV-2 target nucleic acids are NOT DETECTED. The SARS-CoV-2 RNA is generally detectable in upper and lower respiratory specimens during the acute phase of infection. Negative results do not preclude SARS-CoV-2 infection, do not rule out co-infections with other pathogens, and should not be used as the sole basis for treatment or other patient management decisions. Negative results must be combined with clinical observations, patient history, and epidemiological information. The expected result is Negative. Fact Sheet for Patients: SugarRoll.be Fact Sheet for Healthcare Providers: https://www.woods-mathews.com/ This test is not yet approved or cleared by the [place] FDA and  has been authorized for detection and/or diagnosis of SARS-CoV-2 by FDA under an Emergency Use  Authorization (EUA). This EUA will remain  in effect (meaning this test can be used) for the duration of the COVID-19 declaration under Section 56 4(b)(1) of the Act, 21 U.S.C. section 360bbb-3(b)(1), unless the authorization is terminated or revoked sooner. Performed at [facility], [place] [address], [place], [place] [address]   Urine culture     Status: Abnormal   Collection Time: 08/02/19  8:10 PM   Specimen: Urine, Clean Catch  Result Value Ref Range Status   Specimen Description   Final    URINE, CLEAN CATCH Performed at [facility], [address] [place], [place] [address]    Special Requests   Final    NONE Performed at [facility], [address] [place], [place] [address]    Culture (A)  Final    <10,000 COLONIES/mL INSIGNIFICANT GROWTH Performed at [facility] [address], [place], [place] [address]    Report Status 08/04/2019 FINAL  Final         Radiology Studies: No results found.      Scheduled Meds: . metoprolol  tartrate  25 mg Oral BID  . pantoprazole  40 mg Oral Daily  . predniSONE  120 mg Oral Q breakfast  . rosuvastatin  40 mg Oral q1800   Continuous Infusions:   LOS: 4 days    Enzo Bi, MD [facility] If 7PM-7AM, please contact night-coverage 08/06/2019, 9:25 PM

## 2019-08-07 ENCOUNTER — Encounter (HOSPITAL_COMMUNITY): Payer: Self-pay | Admitting: Hospitalist

## 2019-08-07 ENCOUNTER — Inpatient Hospital Stay (HOSPITAL_COMMUNITY): Payer: Medicare Other

## 2019-08-07 LAB — COMPREHENSIVE METABOLIC PANEL
ALT: 10 U/L (ref 0–44)
AST: 21 U/L (ref 15–41)
Albumin: 4 g/dL (ref 3.5–5.0)
Alkaline Phosphatase: 49 U/L (ref 38–126)
Anion gap: 10 (ref 5–15)
BUN: 17 mg/dL (ref 8–23)
CO2: 24 mmol/L (ref 22–32)
Calcium: 9.5 mg/dL (ref 8.9–10.3)
Chloride: 106 mmol/L (ref 98–111)
Creatinine, Ser: 1.13 mg/dL (ref 0.61–1.24)
GFR calc Af Amer: >60 mL/min (ref >60)
GFR calc non Af Amer: >60 mL/min (ref >60)
Glucose, Bld: 111 mg/dL — ABNORMAL HIGH (ref 70–99)
Potassium: 4 mmol/L (ref 3.5–5.1)
Sodium: 140 mmol/L (ref 135–145)
Total Bilirubin: 3.3 mg/dL — ABNORMAL HIGH (ref 0.3–1.2)
Total Protein: 7.1 g/dL (ref 6.5–8.1)

## 2019-08-07 LAB — LACTATE DEHYDROGENASE, PLEURAL OR PERITONEAL FLUID: LD, Fluid: 144 U/L — ABNORMAL HIGH (ref 3–23)

## 2019-08-07 LAB — BODY FLUID CELL COUNT WITH DIFFERENTIAL
Eos, Fluid: 0 %
Lymphs, Fluid: 66 %
Monocyte-Macrophage-Serous Fluid: 20 % — ABNORMAL LOW (ref 50–90)
Neutrophil Count, Fluid: 14 % (ref 0–25)
Other Cells, Fluid: 1 %
Total Nucleated Cell Count, Fluid: 1349 cu mm — ABNORMAL HIGH (ref 0–1000)

## 2019-08-07 LAB — GLUCOSE, CAPILLARY
Glucose-Capillary: 112 mg/dL — ABNORMAL HIGH (ref 70–99)
Glucose-Capillary: 111 mg/dL — ABNORMAL HIGH (ref 70–99)

## 2019-08-07 LAB — PATHOLOGIST SMEAR REVIEW

## 2019-08-07 LAB — RETICULOCYTES
Immature Retic Fract: 37.6 % — ABNORMAL HIGH (ref 2.3–15.9)
RBC.: 2.56 MIL/uL — ABNORMAL LOW (ref 4.22–5.81)
Retic Count, Absolute: 453 10*3/uL — ABNORMAL HIGH (ref 19.0–186.0)
Retic Ct Pct: 17.8 % — ABNORMAL HIGH (ref 0.4–3.1)

## 2019-08-07 LAB — GRAM STAIN: Gram Stain: NONE SEEN

## 2019-08-07 LAB — CBC
HCT: 31.1 % — ABNORMAL LOW (ref 39.0–52.0)
Hemoglobin: 9.6 g/dL — ABNORMAL LOW (ref 13.0–17.0)
MCH: 37.5 pg — ABNORMAL HIGH (ref 26.0–34.0)
MCHC: 30.9 g/dL (ref 30.0–36.0)
MCV: 121.5 fL — ABNORMAL HIGH (ref 80.0–100.0)
Platelets: 244 10*3/uL (ref 150–400)
RBC: 2.56 MIL/uL — ABNORMAL LOW (ref 4.22–5.81)
RDW: 26.4 % — ABNORMAL HIGH (ref 11.5–15.5)
WBC: 15 10*3/uL — ABNORMAL HIGH (ref 4.0–10.5)
nRBC: 0.5 % — ABNORMAL HIGH (ref 0.0–0.2)

## 2019-08-07 LAB — PROTEIN, PLEURAL OR PERITONEAL FLUID: Total protein, fluid: <3 g/dL

## 2019-08-07 LAB — GLUCOSE, PLEURAL OR PERITONEAL FLUID: Glucose, Fluid: 128 mg/dL

## 2019-08-07 LAB — LACTATE DEHYDROGENASE: LDH: 500 U/L — ABNORMAL HIGH (ref 98–192)

## 2019-08-07 MED ORDER — PANTOPRAZOLE SODIUM 40 MG PO TBEC: 40.0000 mg | DELAYED_RELEASE_TABLET | Freq: Every day | ORAL | 0 refills | Status: DC

## 2019-08-07 MED ORDER — PREDNISONE 20 MG PO TABS: 120.0000 mg | ORAL_TABLET | Freq: Every day | ORAL | 0 refills | Status: AC

## 2019-08-07 MED ORDER — GLIMEPIRIDE 1 MG PO TABS: 1.0000 mg | ORAL_TABLET | Freq: Two times a day (BID) | ORAL | Status: DC

## 2019-08-07 NOTE — Procedures (Signed)
PreOperative Dx: RIGHT pleural effusion Postoperative Dx: RIGHT  pleural effusion Procedure:   US guided diagnostic RIGHT thoracentesis Radiologist:  Thornton Papas Anesthesia:  10 ml of 1% lidocaine Specimen:  60 mL of yellow RIGHT pleural fluid EBL:   < 1 ml Complications: None

## 2019-08-07 NOTE — Progress Notes (Signed)
Thoracentesis complete no signs of distress.  

## 2019-08-08 LAB — PATHOLOGIST SMEAR REVIEW

## 2019-08-09 ENCOUNTER — Ambulatory Visit (HOSPITAL_COMMUNITY): Payer: Medicare Other | Admitting: Hematology

## 2019-08-10 ENCOUNTER — Other Ambulatory Visit (HOSPITAL_COMMUNITY): Payer: Self-pay | Admitting: *Deleted

## 2019-08-10 ENCOUNTER — Other Ambulatory Visit: Payer: Self-pay

## 2019-08-10 ENCOUNTER — Inpatient Hospital Stay (HOSPITAL_COMMUNITY): Payer: Medicare Other | Attending: Hematology | Admitting: Hematology

## 2019-08-10 ENCOUNTER — Encounter (HOSPITAL_COMMUNITY): Payer: Self-pay | Admitting: Hematology

## 2019-08-10 VITALS — BP 159/117 | HR 81 | Temp 97.0°F | Resp 20 | Wt 280.1 lb

## 2019-08-10 DIAGNOSIS — Z8572 Personal history of non-Hodgkin lymphomas: Secondary | ICD-10-CM | POA: Insufficient documentation

## 2019-08-10 DIAGNOSIS — C8330 Diffuse large B-cell lymphoma, unspecified site: Secondary | ICD-10-CM | POA: Diagnosis not present

## 2019-08-10 DIAGNOSIS — D591 Autoimmune hemolytic anemia, unspecified: Secondary | ICD-10-CM | POA: Diagnosis not present

## 2019-08-10 DIAGNOSIS — D509 Iron deficiency anemia, unspecified: Secondary | ICD-10-CM | POA: Insufficient documentation

## 2019-08-10 DIAGNOSIS — R599 Enlarged lymph nodes, unspecified: Secondary | ICD-10-CM

## 2019-08-10 DIAGNOSIS — D5919 Other autoimmune hemolytic anemia: Secondary | ICD-10-CM

## 2019-08-10 DIAGNOSIS — Z5112 Encounter for antineoplastic immunotherapy: Secondary | ICD-10-CM | POA: Insufficient documentation

## 2019-08-10 LAB — CBC WITH DIFFERENTIAL/PLATELET
Abs Immature Granulocytes: 0.06 10*3/uL (ref 0.00–0.07)
Basophils Absolute: 0 10*3/uL (ref 0.0–0.1)
Basophils Relative: 0 %
Eosinophils Absolute: 0 10*3/uL (ref 0.0–0.5)
Eosinophils Relative: 0 %
HCT: 31.2 % — ABNORMAL LOW (ref 39.0–52.0)
Hemoglobin: 9.8 g/dL — ABNORMAL LOW (ref 13.0–17.0)
Immature Granulocytes: 1 %
Lymphocytes Relative: 14 %
Lymphs Abs: 1.5 10*3/uL (ref 0.7–4.0)
MCH: 37.7 pg — ABNORMAL HIGH (ref 26.0–34.0)
MCHC: 31.4 g/dL (ref 30.0–36.0)
MCV: 120 fL — ABNORMAL HIGH (ref 80.0–100.0)
Monocytes Absolute: 1 10*3/uL (ref 0.1–1.0)
Monocytes Relative: 9 %
Neutro Abs: 8 10*3/uL — ABNORMAL HIGH (ref 1.7–7.7)
Neutrophils Relative %: 76 %
Platelets: 228 10*3/uL (ref 150–400)
RBC: 2.6 MIL/uL — ABNORMAL LOW (ref 4.22–5.81)
RDW: 23.5 % — ABNORMAL HIGH (ref 11.5–15.5)
WBC: 10.5 10*3/uL (ref 4.0–10.5)
nRBC: 1 % — ABNORMAL HIGH (ref 0.0–0.2)

## 2019-08-10 LAB — COMPREHENSIVE METABOLIC PANEL
ALT: 19 U/L (ref 0–44)
AST: 20 U/L (ref 15–41)
Albumin: 4.1 g/dL (ref 3.5–5.0)
Alkaline Phosphatase: 49 U/L (ref 38–126)
Anion gap: 12 (ref 5–15)
BUN: 19 mg/dL (ref 8–23)
CO2: 24 mmol/L (ref 22–32)
Calcium: 9.1 mg/dL (ref 8.9–10.3)
Chloride: 106 mmol/L (ref 98–111)
Creatinine, Ser: 1.01 mg/dL (ref 0.61–1.24)
GFR calc Af Amer: >60 mL/min (ref >60)
GFR calc non Af Amer: >60 mL/min (ref >60)
Glucose, Bld: 82 mg/dL (ref 70–99)
Potassium: 3.8 mmol/L (ref 3.5–5.1)
Sodium: 142 mmol/L (ref 135–145)
Total Bilirubin: 3 mg/dL — ABNORMAL HIGH (ref 0.3–1.2)
Total Protein: 6.9 g/dL (ref 6.5–8.1)

## 2019-08-10 LAB — RETICULOCYTES
Immature Retic Fract: 32.3 % — ABNORMAL HIGH (ref 2.3–15.9)
RBC.: 2.6 MIL/uL — ABNORMAL LOW (ref 4.22–5.81)
Retic Count, Absolute: 435 10*3/uL — ABNORMAL HIGH (ref 19.0–186.0)
Retic Ct Pct: 17.1 % — ABNORMAL HIGH (ref 0.4–3.1)

## 2019-08-10 LAB — LACTATE DEHYDROGENASE: LDH: 432 U/L — ABNORMAL HIGH (ref 98–192)

## 2019-08-10 NOTE — Patient Instructions (Signed)
[place] at [facility] Discharge Instructions  You were seen today by Dr. Delton Coombes. He went over your recent lab results. You have developed Hemolytic Anemia, he recommends that you continue on the prednisone at this time and for you to start Rituximab infusions. He will also schedule you for a PET scan to further evaluate the small area on your lung. He will see you back in 1 week for labs, treatment and follow up.   Thank you for choosing [place] at [facility] to provide your oncology and hematology care.  To afford each patient quality time with our provider, please arrive at least 15 minutes before your scheduled appointment time.   If you have a lab appointment with the [facility] please come in thru the  Main Entrance and check in at the main information desk  You need to re-schedule your appointment should you arrive 10 or more minutes late.  We strive to give you quality time with our providers, and arriving late affects you and other patients whose appointments are after yours.  Also, if you no show three or more times for appointments you may be dismissed from the clinic at the providers discretion.     Again, thank you for choosing [facility].  Our hope is that these requests will decrease the amount of time that you wait before being seen by our physicians.       _____________________________________________________________  Should you have questions after your visit to [facility], please contact our office at [phone] between the hours of 8:00 a.m. and 4:30 p.m.  Voicemails left after 4:00 p.m. will not be returned until the following business day.  For prescription refill requests, have your pharmacy contact our office and allow 72 hours.    Cancer Center Support Programs:   > Cancer Support Group  2nd Tuesday of the month 1pm-2pm, Journey Room

## 2019-08-10 NOTE — Progress Notes (Signed)
Patient on plan of care prior to pathways. 

## 2019-08-10 NOTE — Progress Notes (Signed)
[name], [place] [address]   CLINIC:  Medical Oncology/Hematology  PCP:  Redmond School, [place] [address] [phone]   REASON FOR VISIT:  Follow-up for large B-cell lymphoma and newly diagnosed autoimmune hemolytic anemia.  CURRENT THERAPY: Prednisone.  BRIEF ONCOLOGIC HISTORY:  Oncology History Overview Note  S/P chemotherapy following stabilization of the spine.  Initial biopsy was on 07/13/2005 on his bone marrow.  Thoracic vertebra was biopsied on 02/04/2005 and mediastinum on 02/03/2005.  S/P R-CHOP and achieved an incomplete response, then underwent ICE chemotherapy pre-transplant and total body radiation with autologous stem cell transplant on 01/15/2006 and maintained on Rituxan for 2 years.  Thus far without recurrence.   DLBCL (diffuse large B cell lymphoma) ([place])  01/27/2005 Imaging   PET- diffuse activity  involving the L cervical area, extensive activity in the mediastinumn particularly in the R paratracheal area and to a lesser degree in the anterior mediastinum.  Lg area in porta caval nodes in upper abd and diffuse bone activity   02/03/2005 Pathology Results   Mediastinal lymph node demonstrating diffuse large cell lymphoma, B-type.  Vertebral bone biopsy demonstrating diffuse large b-cell lymphoma   03/11/2005 - 08/04/2005 Chemotherapy   R-CHOP x 8 cycles   05/04/2005 Imaging   PET- near resolution of previously identified uptake.   07/13/2005 Bone Marrow Biopsy   Bone marrow aspiration and biopsy is negative for lymphoma   08/31/2005 Imaging   PET- further response to therapy with findings consistent with residual disease.  Hypermetabolic activity identified within the anterior mediastinal lymph node (smaller but still pathologic).  Borderline hypermetabolic small right pleural effusion   10/20/2005 - 11/13/2005 Chemotherapy   R-ICE x 2 cycles at [facility]   01/08/2006 - 01/09/2006 Chemotherapy   Cyclophosphamide 600 mg daily x 2   01/10/2006 - 01/14/2006 Radiation Therapy   Total body radiation twice daily   01/15/2006 Bone Marrow Transplant   Stem [facility] transplant at [facility]   04/05/2006 - 12/05/2007 Chemotherapy   Rituxan weekly x 4 every 6 months x 2 years      CANCER STAGING: Cancer Staging DLBCL (diffuse large B cell lymphoma) (HCC) Staging form: Lymphoid Neoplasms, AJCC 6th Edition - Clinical: Stage IV - Signed by Baird Cancer, PA on 04/29/2011    INTERVAL HISTORY:  [name] 68 y.o. male seen for follow-up of autoimmune hemolytic anemia in the setting of history of diffuse large B-cell lymphoma.  He was admitted to the hospital on 08/03/2019 with severe tiredness and was found to have hemoglobin of 6.3.  Further work-up for hemolytic anemia was positive for direct Coombs test for IgG.  LDH and reticulocyte count was elevated.  He was started on prednisone 120 mg daily on 07/28/2019.  He is tolerating prednisone 20 mg daily very well.  Denies any gastric distress.  Shortness of breath on exertion is present.  Appetite and energy levels are 75%.  No pain reported.  Denies any fevers, night sweats or weight loss in the last 6 months.    REVIEW OF SYSTEMS:  Review of Systems  Respiratory: Positive for shortness of breath.   All other systems reviewed and are negative.    PAST MEDICAL/SURGICAL HISTORY:  Past Medical History:  Diagnosis Date  . Asthma    as child  . Diabetes mellitus without complication ([place])   . DLBCL (diffuse large B cell lymphoma) ([place]) 02/28/2009  . Edema   . Heart failure, diastolic, chronic (  [place])    patient denies  . Hyperlipidemia, mixed   . Hypertension   . IDA (iron deficiency anemia)   . Large cell lymphoma ([place]) 01/2005   autologous stem cell transplant 12/2005  . Obesity, morbid (more than 100 lbs over ideal weight or BMI > 40) (HCC)   . Venous insufficiency 04/29/2011   Past Surgical History:  Procedure Laterality Date  . BACK SURGERY   2006   T6 vertebrae removed/titanium placed  . CATARACT EXTRACTION W/PHACO Right 03/31/2019   Procedure: CATARACT EXTRACTION PHACO AND INTRAOCULAR LENS PLACEMENT (IOC);  Surgeon: Baruch Goldmann, MD;  Location: [facility];  Service: Ophthalmology;  Laterality: Right;  CDE: 3.21  . CATARACT EXTRACTION W/PHACO Left 04/14/2019   Procedure: CATARACT EXTRACTION PHACO AND INTRAOCULAR LENS PLACEMENT (IOC);  Surgeon: Baruch Goldmann, MD;  Location: [facility];  Service: Ophthalmology;  Laterality: Left;  left - pt knows to arrive at 7:45, CDE: 3.55  . COLONOSCOPY  11/24/2004   Polyps in the left colon ablated/removed as described above.  Two submucosal lesions consistent with lipomas as described above not  manipulated./ Normal rectum  . COLONOSCOPY  06/20/2012   MULTIPLE RECTAL AND COLONIC POLYPS  . COLONOSCOPY N/A 03/08/2015   RMR: Capacious, redundant colon. Multiple colonic and rectosigmoid polyps removed. ablated as described above. colonic lipoma abnormal appearing terminal ileum likely a variant of normal). Howeverwith history  biopsies obtained.   . ESOPHAGOGASTRODUODENOSCOPY N/A 03/08/2015   RMR: Hiatal hernia Polypoid gastric mucosa with multiple gastric polyps. largest polyp removed via snare polypectomgy hemostasis clip placed at base. Status post gastric biopsy. Status post video capsule placement.   Marland Kitchen GIVENS CAPSULE STUDY N/A 03/08/2015   Procedure: GIVENS CAPSULE STUDY;  Surgeon: Daneil Dolin, MD;  Location: [facility] SU[facility];  Service: Endoscopy;  Laterality: N/A;  . LIMBAL STEM CELL TRANSPLANT    . LUNG BIOPSY  6/06  . MULTIPLE TOOTH EXTRACTIONS  04/2005  . port a cath placement    . PORT-A-CATH REMOVAL  09/08/2012   Procedure: REMOVAL PORT-A-CATH;  Surgeon: Melrose Nakayama, MD;  Location: [facility];  Service: Thoracic;  Laterality: N/A;     SOCIAL HISTORY:  Social History   Socioeconomic History  . Marital status: Married    Spouse name: Not on file  . Number of children: 2  . Years of  education: Not on file  . Highest education level: Not on file  Occupational History  . Occupation: disability due to back    Employer: UNEMPLOYED  Tobacco Use  . Smoking status: Former Smoker    Packs/day: 0.50    Years: 15.00    Pack years: 7.50    Quit date: 11/11/2002    Years since quitting: 16.7  . Smokeless tobacco: Never Used  Substance and Sexual Activity  . Alcohol use: No    Alcohol/week: 0.0 standard drinks  . Drug use: No  . Sexual activity: Yes    Birth control/protection: None  Other Topics Concern  . Not on file  Social History Narrative   Married   No regular exercise   Social Determinants of Health   Financial Resource Strain:   . Difficulty of Paying Living Expenses: Not on file  Food Insecurity:   . Worried About Charity fundraiser in the Last Year: Not on file  . Ran Out of Food in the Last Year: Not on file  Transportation Needs:   . Lack of Transportation (Medical): Not on file  . Lack of Transportation (Non-Medical):  Not on file  Physical Activity:   . Days of Exercise per Week: Not on file  . Minutes of Exercise per Session: Not on file  Stress:   . Feeling of Stress : Not on file  Social Connections:   . Frequency of Communication with Friends and Family: Not on file  . Frequency of Social Gatherings with Friends and Family: Not on file  . Attends Religious Services: Not on file  . Active Member of Clubs or Organizations: Not on file  . Attends Archivist Meetings: Not on file  . Marital Status: Not on file  Intimate Partner Violence:   . Fear of Current or Ex-Partner: Not on file  . Emotionally Abused: Not on file  . Physically Abused: Not on file  . Sexually Abused: Not on file    FAMILY HISTORY:  Family History  Problem Relation Age of Onset  . Cancer Mother        lung  . Cancer Father        prostate  . Colon cancer Neg Hx   . Diabetes Neg Hx     CURRENT MEDICATIONS:  Outpatient Encounter Medications as of  08/10/2019  Medication Sig  . apixaban (ELIQUIS) 5 MG TABS tablet Take 1 tablet (5 mg total) by mouth 2 (two) times daily.  . furosemide (LASIX) 20 MG tablet Take 20 mg by mouth daily as needed for fluid or edema.   Marland Kitchen glimepiride (AMARYL) 1 MG tablet Take 1 tablet (1 mg total) by mouth 2 (two) times daily with a meal. take 1 tablet by mouth twice daily WITH BREAKFAST  . metoprolol tartrate (LOPRESSOR) 25 MG tablet Take 1 tablet (25 mg total) by mouth 2 (two) times daily.  . pantoprazole (PROTONIX) 40 MG tablet Take 1 tablet (40 mg total) by mouth daily.  . predniSONE (DELTASONE) 20 MG tablet Take 6 tablets (120 mg total) by mouth daily with breakfast for 7 days. Or until you see the [facility] doctor.  . rosuvastatin (CRESTOR) 40 MG tablet Take 40 mg by mouth daily.  . clotrimazole-betamethasone (LOTRISONE) cream Apply 1 application topically 2 (two) times daily as needed (leg infection).    No facility-administered encounter medications on file as of 08/10/2019.    ALLERGIES:  Allergies  Allergen Reactions  . Penicillins Rash    Has patient had a PCN reaction causing immediate rash, facial/tongue/throat swelling, SOB or lightheadedness with hypotension: Unknown Has patient had a PCN reaction causing severe rash involving mucus membranes or skin necrosis: Unknown Has patient had a PCN reaction that required hospitalization: Unknown Has patient had a PCN reaction occurring within the last 10 years: No If all of the above answers are "NO", then may proceed with Cephalosporin use.      PHYSICAL EXAM:  ECOG Performance status: 1  Vitals:   08/10/19 0827  BP: (!) 159/117  Pulse: 81  Resp: 20  Temp: (!) 97 F (36.1 C)  SpO2: 93%   Filed Weights   08/10/19 0827  Weight: 280 lb 1.6 oz (127.1 kg)    Physical Exam Vitals reviewed.  Constitutional:      Appearance: Normal appearance.  Cardiovascular:     Rate and Rhythm: Normal rate and regular rhythm.     Heart sounds: Normal  heart sounds.  Pulmonary:     Effort: Pulmonary effort is normal.     Breath sounds: Normal breath sounds.  Abdominal:     General: There is no distension.  Palpations: Abdomen is soft. There is no mass.  Lymphadenopathy:     Cervical: No cervical adenopathy.  Skin:    General: Skin is warm.  Neurological:     General: No focal deficit present.     Mental Status: He is alert and oriented to person, place, and time.  Psychiatric:        Mood and Affect: Mood normal.        Behavior: Behavior normal.      LABORATORY DATA:  I have reviewed the labs as listed.  CBC    Component Value Date/Time   WBC 10.5 08/10/2019 0828   RBC 2.60 (L) 08/10/2019 0828   RBC 2.60 (L) 08/10/2019 0828   HGB 9.8 (L) 08/10/2019 0828   HCT 31.2 (L) 08/10/2019 0828   PLT 228 08/10/2019 0828   MCV 120.0 (H) 08/10/2019 0828   MCH 37.7 (H) 08/10/2019 0828   MCHC 31.4 08/10/2019 0828   RDW 23.5 (H) 08/10/2019 0828   LYMPHSABS 1.5 08/10/2019 0828   MONOABS 1.0 08/10/2019 0828   EOSABS 0.0 08/10/2019 0828   BASOSABS 0.0 08/10/2019 0828   CMP Latest Ref Rng & Units 08/10/2019 08/07/2019 08/06/2019  Glucose 70 - 99 mg/dL 82 111(H) -  BUN 8 - 23 mg/dL 19 17 -  Creatinine 0.61 - 1.24 mg/dL 1.01 1.13 -  Sodium 135 - 145 mmol/L 142 140 -  Potassium 3.5 - 5.1 mmol/L 3.8 4.0 -  Chloride 98 - 111 mmol/L 106 106 -  CO2 22 - 32 mmol/L 24 24 -  Calcium 8.9 - 10.3 mg/dL 9.1 9.5 -  Total Protein 6.5 - 8.1 g/dL 6.9 7.1 6.9  Total Bilirubin 0.3 - 1.2 mg/dL 3.0(H) 3.3(H) 3.6(H)  Alkaline Phos 38 - 126 U/L 49 49 54  AST 15 - 41 U/L 20 21 24   ALT 0 - 44 U/L 19 10 12        DIAGNOSTIC IMAGING:  I have independently reviewed the scans and discussed with the patient.   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  Autoimmune hemolytic anemia: -Admission to the hospital on 08/03/2019 with hemoglobin of 6.3.  Direct Coombs test IgG was positive.  LDH and reticulocyte count was  elevated. -Prednisone 120 mg daily started on 08/05/2019. -Patient tolerating steroids reasonably well. -We reviewed CBC today.  Hemoglobin is 9.8.  LDH is 442 and reticulocyte 17%. -I have recommended weekly rituximab x4 along with steroids for better and durable response rates.  We talked about the side effects in detail. -We will obtain hepatitis panel.  We will schedule him as soon as we get authorization from insurance. -He will continue prednisone at the same dose of 120 mg daily.  2.  Diffuse large B-cell lymphoma: -Originally diagnosed in 2006, treated with R-CHOP (03/11/2005 -08/04/2005) with complete response on PET scan. -Relapse of lymphoma on 08/31/2005.  He was given ICE chemotherapy followed by stem cell transplant on 01/15/2006. -He received rituximab weekly x4 followed by every 6 months for 2 years from 04/05/2006 through 12/05/2007. -CT CAP on 08/04/2019 showed increase in size of the pretracheal lymph node compared to prior scans.  Rest of the lymphadenopathy is stable. -I have recommended doing a PET CT scan to evaluate for recurrence.      Orders placed this encounter:  Orders Placed This Encounter  Procedures  . NM PET Image Restag (PS) Skull Base To Thigh  . CBC with Differential/Platelet  . Comprehensive metabolic panel  . Lactate  dehydrogenase  . Reticulocytes      Derek Jack, MD [facility] [phone]

## 2019-08-10 NOTE — Progress Notes (Signed)
START OFF PATHWAY REGIMEN - Other   [id number]:Rituximab (Weekly):   Administer weekly:     Rituximab-xxxx   **Always confirm dose/schedule in your pharmacy ordering system**  Patient Characteristics: Intent of Therapy: Curative Intent, Discussed with Patient

## 2019-08-10 NOTE — Assessment & Plan Note (Addendum)
1.  Autoimmune hemolytic anemia: -Admission to the hospital on 08/03/2019 with hemoglobin of 6.3.  Direct Coombs test IgG was positive.  LDH and reticulocyte count was elevated. -Prednisone 120 mg daily started on 08/05/2019. -Patient tolerating steroids reasonably well. -We reviewed CBC today.  Hemoglobin is 9.8.  LDH is 442 and reticulocyte 17%. -I have recommended weekly rituximab x4 along with steroids for better and durable response rates.  We talked about the side effects in detail. -We will obtain hepatitis panel.  We will schedule him as soon as we get authorization from insurance. -He will continue prednisone at the same dose of 120 mg daily.  2.  Diffuse large B-cell lymphoma: -Originally diagnosed in 2006, treated with R-CHOP (03/11/2005 -08/04/2005) with complete response on PET scan. -Relapse of lymphoma on 08/31/2005.  He was given ICE chemotherapy followed by stem cell transplant on 01/15/2006. -He received rituximab weekly x4 followed by every 6 months for 2 years from 04/05/2006 through 12/05/2007. -CT CAP on 08/04/2019 showed increase in size of the pretracheal lymph node compared to prior scans.  Rest of the lymphadenopathy is stable. -I have recommended doing a PET CT scan to evaluate for recurrence.

## 2019-08-11 NOTE — Discharge Summary (Addendum)
Physician Discharge Summary   [name]  male DOB: 18-Nov-1950  KKX:[id number]  PCP: Redmond School, MD  Admit date: 08/02/2019 Discharge date: 08/07/2019  Admitted From: home Disposition:  home CODE STATUS: Full code  Discharge Instructions    Diet - low sodium heart healthy   Complete by: As directed    Discharge instructions   Complete by: As directed    You need to follow up with the [place] doctor, Dr. Delton Coombes, soon after discharge.  I have provided the Dr. Tomie China contact info, please call to make sure you have an appointment scheduled.  You will take the prednisone 120 mg daily until you see your HemOnc doctor.  Please see your primary care doctor 1-2 weeks after discharge for follow up and lab checks.    Dr. Enzo Bi  Please follow up with   Increase activity slowly   Complete by: As directed        Hospital Course:  For full details, please see H&P, progress notes, consult notes and ancillary notes.  Briefly,  Frank Nicklaus Burnsis a 68 y.o.AA malewith medical history significantforhypertension, atrial flutter on Eliquis, diastolic heart failure, diabetes mellitus type 2, diffuse large B-cell lymphoma status post stem cell transplant 2007, iron deficiency anemiawho presented with worsening dyspnea.   #Symptomatic macrocytic anemia due to autoimmune hemolytic anemia On presentation,Hgb 7.1,MCV 137 (14.5 and 94.7 respectivelyback inMay 2020). HDS. No obvious signs of GI bleed, though was on Eliquis. Total bili 5.6, haptoglobin <10, LDH 496, strongly suggesting hemolysis.  Pt received 2u pRBC with appropriate rise in Hgb (of note, pt's blood was difficulty to match due to abx).  HemOnc consulted who ordered additional labs: Direct Coombs test positive for IgG complement, negative for C3d.  Pt was started on prednisone 1 mg/kg daily on 12/12 by HemOnc, and was continued at discharge until outpatient HemOnc followup.  Will start rituximab weekly x4  after discharge, in the clinic, per HemOnc.  Hgb 9.6 on the day of discharge.  #Dyspnea on exertion due to anemia Pt was very symptomatic with increased RR with just minimum exertion, though sating well on room air. COVID neg.  CXR on presentation showed no acute finding. No signs of volume overload or PNA.  CT chest/ab/pelvis on 08/04/19 showed "Moderate right, small left pleural effusions, which appear Loculated".  Since pt remained dyspneic after Hgb had increased to 8.8 with 2u pRBC, US thoracentesis was ordered with removal of 60 ml of yellow pleural fluid from the right side on 08/07/19.  Pleural fluid studies sent, which based on the protein content (<3), appeared to be transudative.  LDH was elevated in pleural fluid at 144, which was to be expected given serum LDH was very elevated due to the known hemolysis.  Pleural fluid cx neg growth after 4 days.  Dyspnea was therefore mostly due to anemia.  Diffuse large B-cell lymphoma: Per HemOnc note, "Diagnosed in November 2006, treated with R-CHOP chemotherapy with incomplete response.  He went on to receiveICEchemotherapy followed by autologous stem cell transplant on 01/15/2006.  He completed 2 years of maintenance rituximab posttreatment.  He has been NED since then."  CT CAP on 08/04/2019 showed "enlarged anterior mediastinal, pretracheal and AP window lymph nodes, some of which are calcified in the anterior mediastinum. Pretracheal lymph node appears enlarged compared to prior exam of the chest dated 05/18/2017, measuring 1.9 x 1.5 cm. Other lymph nodes are generally similar."  HemOnc will consider doing a PET CT scan as outpatient.  #  Atrial flutter on Eliquis, rate controlled Continued home metop and held home Eliquis on presentation.  Since anemia was found to be due to hemolysis and not bleeding, Eliquis was resumed at discharge.  # DM2, not on insulin Most recent A1c wnl.  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      Medication List    TAKE these medications   apixaban 5 MG Tabs tablet Commonly known as: Eliquis Take 1 tablet (5 mg total) by mouth 2 (two) times daily.   clotrimazole-betamethasone cream Commonly known as: LOTRISONE Apply 1 application topically 2 (two) times daily as needed (leg infection).   furosemide 20 MG tablet Commonly known as: LASIX Take 20 mg by mouth daily as needed for fluid or edema.   glimepiride 1 MG tablet Commonly known as: AMARYL Take 1 tablet (1 mg total) by mouth 2 (two) times daily with a meal. take 1 tablet by mouth twice daily WITH BREAKFAST   metoprolol tartrate 25 MG tablet Commonly known as: LOPRESSOR Take 1 tablet (25 mg total) by mouth 2 (two) times daily.   pantoprazole 40 MG tablet Commonly known as: PROTONIX Take 1 tablet (40 mg total) by mouth daily.   predniSONE 20 MG tablet Commonly known as: DELTASONE Take 6 tablets (120 mg total) by mouth daily with breakfast for 7 days. Or until you see the [place] doctor.   rosuvastatin 40 MG tablet Commonly known as: CRESTOR Take 40 mg by mouth daily.       Follow-up Information    Derek Jack, MD. Go on 08/09/2019.   Specialty: Hematology Why: You need to follow up with Dr. Delton Coombes  2-3 days after discharge for your anemia. Be at [facility] at 2:20 on 08/09/19 Contact information: [place] [address] [phone]           Allergies  Allergen Reactions  . Penicillins Rash    Has patient had a PCN reaction causing immediate rash, facial/tongue/throat swelling, SOB or lightheadedness with hypotension: Unknown Has patient had a PCN reaction causing severe rash involving mucus membranes or skin necrosis: Unknown Has patient had a PCN reaction that required hospitalization: Unknown Has patient had a PCN reaction occurring within the last 10 years: No If all of the above answers are "NO", then may proceed with Cephalosporin use.      The results of significant diagnostics from this hospitalization (including imaging, microbiology, ancillary and laboratory) are listed below for reference.   Consultations:   Procedures/Studies: DG Chest 1 View  Result Date: 08/07/2019 CLINICAL DATA:  Small RIGHT pleural effusion post diagnostic thoracentesis, history of large cell lymphoma, hypertension, diabetes mellitus, asthma, former smoker EXAM: CHEST  1 VIEW COMPARISON:  08/02/2019 FINDINGS: Small bibasilar effusions. Loculated fluid at RIGHT major fissure  as noted on prior CT. No pneumothorax post thoracentesis. Enlargement of cardiac silhouette. Prior thoracic spine stabilization procedure. IMPRESSION: No pneumothorax following RIGHT thoracentesis. Electronically Signed   By: Lavonia Dana M.D.   On: 08/07/2019 10:17   CT CHEST W CONTRAST  Result Date: 08/04/2019 CLINICAL DATA:  Shortness of breath, chest pain, history of lymphoma in remission EXAM: CT CHEST, ABDOMEN, AND PELVIS WITH CONTRAST TECHNIQUE: Multidetector CT imaging of the chest, abdomen and pelvis was performed following the standard protocol during bolus administration of intravenous contrast. CONTRAST:  132mL OMNIPAQUE IOHEXOL 300 MG/ML SOLN, additional oral enteric contrast COMPARISON:  CT  chest angiogram, 05/18/2017, PET-CT, 08/31/2005 FINDINGS: CT CHEST FINDINGS Cardiovascular: No significant vascular findings. Cardiomegaly. Scattered coronary artery calcifications. No pericardial effusion. Mediastinum/Nodes: There are enlarged anterior mediastinal, pretracheal, and AP window lymph nodes, some of which are calcified in the anterior mediastinum. A pretracheal lymph node appears enlarged compared to prior examination, measuring 1.9 x 1.5 cm (series 3, image 14). Other lymph nodes are generally similar. Thyroid gland, trachea, and esophagus demonstrate no significant findings. Lungs/Pleura: Moderate right, small left pleural effusions, which appear loculated. There is associated partial atelectasis and/or scarring of the dependent bilateral lungs. Musculoskeletal: Corpectomy of T6. CT ABDOMEN PELVIS FINDINGS Hepatobiliary: No solid liver abnormality is seen. No gallstones, gallbladder wall thickening, or biliary dilatation. Pancreas: Unremarkable. No pancreatic ductal dilatation or surrounding inflammatory changes. Spleen: Normal in size without significant abnormality. Adrenals/Urinary Tract: Adrenal glands are unremarkable. Kidneys are normal, without renal calculi, solid lesion, or hydronephrosis. Bladder is unremarkable. Stomach/Bowel: Stomach is within normal limits. Appendix appears normal. No evidence of bowel wall thickening, distention, or inflammatory changes. Vascular/Lymphatic: Aortic atherosclerosis. There is periaortic soft tissue thickening, which is new compared to prior examination dated 08/31/2005 and generally most consistent with post treatment appearance of lymphadenopathy (series 3, image 71). There are no discretely enlarged lymph nodes in the abdomen or pelvis. Reproductive: No mass or other abnormality. Other: No abdominal wall hernia or abnormality. No abdominopelvic ascites. Musculoskeletal: No acute or significant osseous findings. IMPRESSION: 1. Moderate right, small left  pleural effusions, which appear loculated. Associated partial atelectasis and/or scarring of the dependent bilateral lungs. 2. There are enlarged anterior mediastinal, pretracheal, and AP window lymph nodes, some of which are calcified in the anterior mediastinum. A pretracheal lymph node appears enlarged compared to prior examination of the chest dated 05/18/2017, measuring 1.9 x 1.5 cm (series 3, image 14). Other lymph nodes are generally similar. No definite evidence of recurrent lymphoma. PET-CT may be helpful to assess for metabolic activity and recurrent malignancy if generally suspected based upon clinical presentation. 3. Retroperitoneal soft tissue thickening, which is new compared to most recent imaging of the abdomen and pelvis dated 08/31/2005 and generally most consistent with post treatment appearance of lymphadenopathy. 4. Coronary artery disease. Aortic Atherosclerosis (ICD10-I70.0). Electronically Signed   By: Eddie Candle M.D.   On: 08/04/2019 21:34   CT ABDOMEN PELVIS W CONTRAST  Result Date: 08/04/2019 CLINICAL DATA:  Shortness of breath, chest pain, history of lymphoma in remission EXAM: CT CHEST, ABDOMEN, AND PELVIS WITH CONTRAST TECHNIQUE: Multidetector CT imaging of the chest, abdomen and pelvis was performed following the standard protocol during bolus administration of intravenous contrast. CONTRAST:  160mL OMNIPAQUE IOHEXOL 300 MG/ML SOLN, additional oral enteric contrast COMPARISON:  CT chest angiogram, 05/18/2017, PET-CT, 08/31/2005 FINDINGS: CT CHEST FINDINGS Cardiovascular: No significant vascular findings. Cardiomegaly. Scattered coronary artery calcifications. No pericardial effusion. Mediastinum/Nodes: There are enlarged anterior mediastinal, pretracheal, and AP  window lymph nodes, some of which are calcified in the anterior mediastinum. A pretracheal lymph node appears enlarged compared to prior examination, measuring 1.9 x 1.5 cm (series 3, image 14). Other lymph nodes are  generally similar. Thyroid gland, trachea, and esophagus demonstrate no significant findings. Lungs/Pleura: Moderate right, small left pleural effusions, which appear loculated. There is associated partial atelectasis and/or scarring of the dependent bilateral lungs. Musculoskeletal: Corpectomy of T6. CT ABDOMEN PELVIS FINDINGS Hepatobiliary: No solid liver abnormality is seen. No gallstones, gallbladder wall thickening, or biliary dilatation. Pancreas: Unremarkable. No pancreatic ductal dilatation or surrounding inflammatory changes. Spleen: Normal in size without significant abnormality. Adrenals/Urinary Tract: Adrenal glands are unremarkable. Kidneys are normal, without renal calculi, solid lesion, or hydronephrosis. Bladder is unremarkable. Stomach/Bowel: Stomach is within normal limits. Appendix appears normal. No evidence of bowel wall thickening, distention, or inflammatory changes. Vascular/Lymphatic: Aortic atherosclerosis. There is periaortic soft tissue thickening, which is new compared to prior examination dated 08/31/2005 and generally most consistent with post treatment appearance of lymphadenopathy (series 3, image 71). There are no discretely enlarged lymph nodes in the abdomen or pelvis. Reproductive: No mass or other abnormality. Other: No abdominal wall hernia or abnormality. No abdominopelvic ascites. Musculoskeletal: No acute or significant osseous findings. IMPRESSION: 1. Moderate right, small left pleural effusions, which appear loculated. Associated partial atelectasis and/or scarring of the dependent bilateral lungs. 2. There are enlarged anterior mediastinal, pretracheal, and AP window lymph nodes, some of which are calcified in the anterior mediastinum. A pretracheal lymph node appears enlarged compared to prior examination of the chest dated 05/18/2017, measuring 1.9 x 1.5 cm (series 3, image 14). Other lymph nodes are generally similar. No definite evidence of recurrent lymphoma. PET-CT  may be helpful to assess for metabolic activity and recurrent malignancy if generally suspected based upon clinical presentation. 3. Retroperitoneal soft tissue thickening, which is new compared to most recent imaging of the abdomen and pelvis dated 08/31/2005 and generally most consistent with post treatment appearance of lymphadenopathy. 4. Coronary artery disease. Aortic Atherosclerosis (ICD10-I70.0). Electronically Signed   By: Eddie Candle M.D.   On: 08/04/2019 21:34   DG Chest Portable 1 View  Result Date: 08/02/2019 CLINICAL DATA:  Shortness of breath EXAM: PORTABLE CHEST 1 VIEW COMPARISON:  02/09/2018 FINDINGS: Chronic peripheral lower lung pleuroparenchymal opacities. No new consolidation or edema. Stable cardiomediastinal contours. Thoracic spine fixation hardware is again noted. IMPRESSION: No acute process in the chest. Electronically Signed   By: Macy Mis M.D.   On: 08/02/2019 09:13   US THORACENTESIS ASP PLEURAL SPACE W/IMG GUIDE  Result Date: 08/07/2019 INDICATION: Shortness of breath, dyspnea, loculated RIGHT pleural effusion, for diagnostic thoracentesis, history large-cell lymphoma, diabetes mellitus, hypertension EXAM: ULTRASOUND GUIDED DIAGNOSTIC RIGHT THORACENTESIS MEDICATIONS: None. COMPLICATIONS: None immediate. PROCEDURE: An ultrasound guided thoracentesis was thoroughly discussed with the patient and questions answered. The benefits, risks, alternatives and complications were also discussed. The patient understands and wishes to proceed with the procedure. Written consent was obtained. Ultrasound was performed to localize and mark an small pleural effusion at the posteroinferior RIGHT chest. The area was then prepped and draped in the normal sterile fashion. 1% Lidocaine was used for local anesthesia. Under ultrasound guidance a 5 [place] Yueh catheter was introduced. Thoracentesis was performed. The catheter was removed and a dressing applied. FINDINGS: A total of  approximately 60 cc of yellow RIGHT pleural fluid was removed. Samples were sent to the laboratory as requested by the clinical team. IMPRESSION: Successful ultrasound guided RIGHT thoracentesis yielding  60 mL of pleural fluid. Electronically Signed   By: Lavonia Dana M.D.   On: 08/07/2019 10:15      Labs: BNP (last 3 results) Recent Labs    08/02/19 0841  BNP 440.3*   Basic Metabolic Panel: Recent Labs  Lab 08/05/19 1309 08/07/19 1015 08/10/19 0828  NA 138 140 142  K 3.9 4.0 3.8  CL 106 106 106  CO2 22 24 24   GLUCOSE 99 111* 82  BUN 11 17 19   CREATININE 1.01 1.13 1.01  CALCIUM 8.8* 9.5 9.1  MG 2.2  --   --    Liver Function Tests: Recent Labs  Lab 08/06/19 0700 08/07/19 1015 08/10/19 0828  AST 24 21 20   ALT 12 10 19   ALKPHOS 54 49 49  BILITOT 3.6* 3.3* 3.0*  PROT 6.9 7.1 6.9  ALBUMIN 3.8 4.0 4.1   No results for input(s): LIPASE, AMYLASE in the last 168 hours. No results for input(s): AMMONIA in the last 168 hours. CBC: Recent Labs  Lab 08/05/19 1309 08/07/19 1015 08/10/19 0828  WBC 7.3 15.0* 10.5  NEUTROABS  --   --  8.0*  HGB 8.8* 9.6* 9.8*  HCT 28.2* 31.1* 31.2*  MCV 120.5* 121.5* 120.0*  PLT 202 244 228   Cardiac Enzymes: No results for input(s): CKTOTAL, CKMB, CKMBINDEX, TROPONINI in the last 168 hours. BNP: Invalid input(s): POCBNP CBG: Recent Labs  Lab 08/06/19 0006 08/06/19 2153 08/07/19 0738 08/07/19 1142  GLUCAP 130* 139* 112* 111*   D-Dimer No results for input(s): DDIMER in the last 72 hours. Hgb A1c No results for input(s): HGBA1C in the last 72 hours. Lipid Profile No results for input(s): CHOL, HDL, LDLCALC, TRIG, CHOLHDL, LDLDIRECT in the last 72 hours. Thyroid function studies No results for input(s): TSH, T4TOTAL, T3FREE, THYROIDAB in the last 72 hours.  Invalid input(s): FREET3 Anemia work up Recent Labs    08/10/19 0828  RETICCTPCT 17.1*   Urinalysis    Component Value Date/Time   COLORURINE AMBER (A)  08/02/2019 2010   APPEARANCEUR CLEAR 08/02/2019 2010   LABSPEC 1.018 08/02/2019 2010   PHURINE 5.0 08/02/2019 2010   GLUCOSEU NEGATIVE 08/02/2019 2010   HGBUR MODERATE (A) 08/02/2019 2010   [place] NEGATIVE 08/02/2019 2010   KETONESUR 5 (A) 08/02/2019 2010   PROTEINUR 30 (A) 08/02/2019 2010   NITRITE NEGATIVE 08/02/2019 2010   LEUKOCYTESUR NEGATIVE 08/02/2019 2010   Sepsis Labs Invalid input(s): PROCALCITONIN,  WBC,  LACTICIDVEN Microbiology Recent Results (from the past 240 hour(s))  SARS CORONAVIRUS 2 (TAT 6-24 HRS) Nasopharyngeal Nasopharyngeal Swab     Status: None   Collection Time: 08/02/19  8:41 AM   Specimen: Nasopharyngeal Swab  Result Value Ref Range Status   SARS Coronavirus 2 NEGATIVE NEGATIVE Final    Comment: (NOTE) SARS-CoV-2 target nucleic acids are NOT DETECTED. The SARS-CoV-2 RNA is generally detectable in upper and lower respiratory specimens during the acute phase of infection. Negative results do not preclude SARS-CoV-2 infection, do not rule out co-infections with other pathogens, and should not be used as the sole basis for treatment or other patient management decisions. Negative results must be combined with clinical observations, patient history, and epidemiological information. The expected result is Negative. Fact Sheet for Patients: SugarRoll.be Fact Sheet for Healthcare Providers: https://www.woods-mathews.com/ This test is not yet approved or cleared by the [place] FDA and  has been authorized for detection and/or diagnosis of SARS-CoV-2 by FDA under an Emergency Use Authorization (EUA). This EUA will remain  in effect (meaning this test can be used) for the duration of the COVID-19 declaration under Section 56 4(b)(1) of the Act, 21 U.S.C. section 360bbb-3(b)(1), unless the authorization is terminated or revoked sooner. Performed at [facility], [place] [address] [place],  [place] [address]   Urine culture     Status: Abnormal   Collection Time: 08/02/19  8:10 PM   Specimen: Urine, Clean Catch  Result Value Ref Range Status   Specimen Description   Final    URINE, CLEAN CATCH Performed at [facility], [address], [place], [place] [address]    Special Requests   Final    NONE Performed at [facility], [address], [place], [place] [address]    Culture (A)  Final    <10,000 COLONIES/mL INSIGNIFICANT GROWTH Performed at [place] [address] [place], [place] [address]    Report Status 08/04/2019 FINAL  Final  Culture, body fluid-bottle     Status: None (Preliminary result)   Collection Time: 08/07/19  9:40 AM   Specimen: Pleura  Result Value Ref Range Status   Specimen Description PLEURAL  Final   Special Requests BOTTLES DRAWN AEROBIC AND ANAEROBIC 10 CC EACH  Final   Culture   Final    NO GROWTH 4 DAYS Performed at [facility], [address] [place], [place] [address]    Report Status PENDING  Incomplete  Gram stain     Status: None   Collection Time: 08/07/19  9:40 AM   Specimen: Pleura  Result Value Ref Range Status   Specimen Description PLEURAL  Final   Special Requests NONE  Final   Gram Stain   Final    NO ORGANISMS SEEN WBC PRESENT, PREDOMINANTLY MONONUCLEAR CYTOSPIN SMEAR Performed at [facility], [address], [place], [place] [address]    Report Status 08/07/2019 FINAL  Final     Total time spend on discharging this patient, including the last patient exam, discussing the hospital stay, instructions for ongoing care as it relates to all pertinent caregivers, as well as preparing the medical discharge records, prescriptions, and/or referrals as applicable, is 35 minutes.    Enzo Bi, MD  [facility] 08/11/2019, 3:00 PM  If 7PM-7AM, please contact night-coverage

## 2019-08-12 LAB — CULTURE, BODY FLUID W GRAM STAIN -BOTTLE: Culture: NO GROWTH

## 2019-08-14 ENCOUNTER — Encounter (HOSPITAL_COMMUNITY)
Admission: RE | Admit: 2019-08-14 | Discharge: 2019-08-14 | Disposition: A | Discharge status: Home / Self Care | Payer: Medicare Other | Source: Ambulatory Visit | Attending: Hematology | Admitting: Hematology

## 2019-08-14 ENCOUNTER — Other Ambulatory Visit: Payer: Self-pay

## 2019-08-14 DIAGNOSIS — C8330 Diffuse large B-cell lymphoma, unspecified site: Secondary | ICD-10-CM | POA: Diagnosis not present

## 2019-08-14 DIAGNOSIS — R599 Enlarged lymph nodes, unspecified: Secondary | ICD-10-CM | POA: Diagnosis not present

## 2019-08-14 DIAGNOSIS — D5919 Other autoimmune hemolytic anemia: Secondary | ICD-10-CM | POA: Insufficient documentation

## 2019-08-14 DIAGNOSIS — C8590 Non-Hodgkin lymphoma, unspecified, unspecified site: Secondary | ICD-10-CM | POA: Diagnosis not present

## 2019-08-14 MED ORDER — FLUDEOXYGLUCOSE F - 18 (FDG) INJECTION
16.2300 | Freq: Once | INTRAVENOUS | Status: AC | PRN
  Administered 2019-08-14: 18:00:00 16.23 via INTRAVENOUS

## 2019-08-14 NOTE — Patient Instructions (Addendum)
[name] are diagnosed with autoimmune hemolytic anemia.  You will be treated weekly for 4 weeks with rituximab to treat this disorder.  The intent of treatment is cure this disease. You will see the doctor regularly throughout treatment.  We will obtain blood work from you prior to every treatment and monitor your results to make sure it is safe to give your treatment. The doctor monitors your response to treatment by the way you are feeling, your blood work, and by obtaining scans periodically.  There will be wait times while you are here for treatment.  It will take about 30 minutes to 1 hour for your lab work to result.  Then there will be wait times while pharmacy mixes your medications.   Rituximab-xxxx (Rituxan, Ruxience, Truxima)  About This Drug  Rituximab is a monoclonal antibody (immunotherapy) used to treat cancer. It is given in the vein (IV).  Possible Side Effects . Fever and chills . Tiredness and weakness . Infection . While you are getting this drug in your vein (IV), you may have a reaction to the drug. Sometimes you may be given medication to stop or lessen these side effects. Your nurse will check you closely for these signs: fever or shaking chills, flushing, facial swelling, feeling dizzy, headache, trouble breathing, rash, itching, chest tightness, or chest pain. These reactions may happen after your infusion. If this happens, call 911 for emergency care.  . A decrease in the number of white blood cells. This may raise your risk of infection.  Note: Each of the side effects above was reported in 25% or greater of patients treated with rituximabxxxx. Not all possible side effects are included above.  Warnings and Precautions  . Tumor lysis syndrome: This drug may act on the cancer cells very quickly. This may affect how your kidneys work. . Severe infections, including viral, bacterial and fungal, which can be  life-threatening. . Severe infusion reactions, which can be life-threatening. . Abnormal heartbeat or heart attack. . Changes in your kidney function, which can cause kidney failure and be life-threatening. . Bowel obstruction - a partial or complete blockage of your small and/or large intestine. . Perforation - an abnormal hole in your small and/or large intestine. . Severe allergic skin reaction. You may develop blisters on your skin that are filled with fluid or a severe red rash all over your body that may be painful and very rarely be fatal. You may have soreness of the mouth and throat. You may have red areas, white patches, or sores that hurt. . Reactivation of the hepatitis B virus if you have ever been exposed to the virus which can affect your liver function and cause liver failure and be life-threatening. . Changes in your central nervous system can happen. The central nervous system is made up of your brain and spinal cord. You could feel extreme tiredness, agitation, or confusion, or have hallucinations (see or hear things that are not there), trouble understanding or speaking, loss of control of your bowels or bladder, eyesight changes, numbness or lack of strength to your arms, legs, face, or body, and coma. If you start to have any of these symptoms let your doctor know right away. Note: Some of the side effects above are very rare. If you have concerns and/or questions, please discuss them with your medical team.  Important Information  . Talk to your doctor before receiving any vaccinations during your treatment. Some vaccinations are  not recommended while receiving rituximab-xxxx.  Treating Side Effects  . Manage tiredness by pacing your activities for the day. . Be sure to include periods of rest between energy-draining activities. . To decrease the risk of infection, wash your hands regularly. . Avoid close contact with people who have a cold, the flu, or other  infections. . Take your temperature as your doctor or nurse tells you, and whenever you feel like you may have a fever. . Drink plenty of fluids (a minimum of eight glasses per day is recommended). . If you get a rash do not put anything on it unless your doctor or nurse says you may. Keep the area around the rash clean and dry. Ask your doctor for medicine if your rash bothers you. . Infusion reactions may occur after your infusion. If this happens, call 911 for emergency care.  Food and Drug Interactions  . There are no known interactions of rituximab-xxxx with food. . This drug may interact with other medicines. Tell your doctor and pharmacist about all the prescription and over-the-counter medicines and dietary supplements (vitamins, minerals, herbs and others) that you are taking at this time. Also, check with your doctor or pharmacist before starting any new prescription or over-the-counter medicines, or dietary supplements to make sure that there are no interactions.  When to Call the Doctor  Call your doctor or nurse if you have any of these symptoms and/or any new or unusual symptoms: . Fever of 100.4 F (38 C) or higher . Chills . Feeling dizzy or lightheaded . Confusion and/or agitation . Hallucinations . Trouble understanding or speaking . Blurry vision or changes in your eyesight . Numbness or lack of strength to your arms, legs, face, or body . Wheezing or trouble breathing . Chest pain or symptoms of a heart attack. Most heart attacks involve pain in the center of the chest that lasts more than a few minutes. The pain may go away and come back or it can be constant. It can feel like pressure, squeezing, fullness, or pain. Sometimes pain is felt in one or both arms, the back, neck, jaw, or stomach. If any of these symptoms last 2 minutes, call 911. Marland Kitchen Feeling that your heart is beating in a fast or not normal way (palpitations) . Tiredness that interferes with your daily  activities . Flu-like symptoms: fever, headache, muscle and joint aches, and fatigue (low energy, feeling weak) . Difficulty swallowing . Abdominal pain that does not go away . Throwing up more than 3 times a way . Diarrhea, 4 times in one day or diarrhea with lack of strength or a feeling of being dizzy . Nausea that stops you from eating or drinking or is not relieved by prescribed medicine . No bowel movement in 3 days or when you feel uncomfortable . Decreased or very dark urine . A new rash or a rash that is not relieved by prescribed medicines . Signs of infusion reaction: fever or shaking chills, flushing, facial swelling, feeling dizzy, headache, trouble breathing, rash, itching, chest tightness, or chest pain. If this happens, call 911 for emergency care. . While you are getting this drug, please tell your nurse right away if you have any pain, redness, or swelling at the site of the injection . Signs of possible liver problems: dark urine, pale bowel movements, bad stomach pain, feeling very tired and weak, unusual itching, or yellowing of the eyes or skin . Signs of tumor lysis: Confusion or agitation, decreased urine,  nausea/vomiting, diarrhea, muscle cramping, numbness and/or tingling, seizures. . If you think you may be pregnant  Reproduction Warnings  . Pregnancy warning: This drug can have harmful effects on the unborn baby. Women of childbearing potential should use effective methods of birth control during your cancer treatment and for at least 12 months after treatment. Let your doctor know right away if you think you may be pregnant. . Breastfeeding warning: Women should not breastfeed during treatment and for 6 months after treatment because this drug could enter the breast milk and cause harm to a breastfeeding baby. . Fertility warning: Fertility studies have not been done with this drug. Talk with your doctor or nurse if you plan to have children. Ask for information  on sperm or egg banking.  SELF CARE ACTIVITIES WHILE ON IMMUNOTHERAPY:  Hydration Increase your fluid intake 48 hours prior to treatment and drink at least 8 to 12 cups (64 ounces) of water/decaffeinated beverages per day after treatment. You can still have your cup of coffee or soda but these beverages do not count as part of your 8 to 12 cups that you need to drink daily. No alcohol intake.  Medications Continue taking your normal prescription medication as prescribed.  If you start any new herbal or new supplements please let us know first to make sure it is safe.  Mouth Care Have teeth cleaned professionally before starting treatment. Keep dentures and partial plates clean. Use soft toothbrush and do not use mouthwashes that contain alcohol. Biotene is a good mouthwash that is available at most pharmacies or may be ordered by calling [phone]. Use warm salt water gargles (1 teaspoon salt per 1 quart warm water) before and after meals and at bedtime. Or you may rinse with 2 tablespoons of three-percent hydrogen peroxide mixed in eight ounces of water. If you are still having problems with your mouth or sores in your mouth please call the clinic. If you need dental work, please let the doctor know before you go for your appointment so that we can coordinate the best possible time for you in regards to your chemo regimen. You need to also let your dentist know that you are actively taking chemo. We may need to do labs prior to your dental appointment.  Skin Care Always use sunscreen that has not expired and with SPF (Sun Protection Factor) of 50 or higher. Wear hats to protect your head from the sun. Remember to use sunscreen on your hands, ears, face, & feet.  Use good moisturizing lotions such as udder cream, eucerin, or even Vaseline. Some chemotherapies can cause dry skin, color changes in your skin and nails.    . Avoid long, hot showers or baths. . Use gentle, fragrance-free soaps and  laundry detergent. . Use moisturizers, preferably creams or ointments rather than lotions because the thicker consistency is better at preventing skin dehydration. Apply the cream or ointment within 15 minutes of showering. Reapply moisturizer at night, and moisturize your hands every time after you wash them.   Infection Prevention Please wash your hands for at least 30 seconds using warm soapy water. Handwashing is the #1 way to prevent the spread of germs. Stay away from sick people or people who are getting over a cold. If you develop respiratory systems such as green/yellow mucus production or productive cough or persistent cough let us know and we will see if you need an antibiotic. It is a good idea to keep a pair of gloves on  when going into grocery stores/[employer] to decrease your risk of coming into contact with germs on the carts, etc. Carry alcohol hand gel with you at all times and use it frequently if out in public. If your temperature reaches 100.5 or higher please call the clinic and let us know.  If it is after hours or on the weekend please go to the ER if your temperature is over 100.4.  Please have your own personal thermometer at home to use.    Sex and bodily fluids If you are going to have sex, a condom must be used to protect the person that isn't taking immunotherapy. For a few days after treatment, immunotherapy can be excreted through your bodily fluids.  When using the toilet please close the lid and flush the toilet twice.  Do this for a few day after you have had immunotherapy.   Contraception It is not known for sure whether or not immunotherapy drugs can be passed on through semen or secretions from the vagina. Because of this some doctors advise people to use a barrier method if you have sex during treatment. This applies to vaginal, anal or oral sex.  Generally, doctors advise a barrier method only for the time you are actually having the treatment and for about a week  after your treatment.  Advice like this can be worrying, but this does not mean that you have to avoid being intimate with your partner. You can still have close contact with your partner and continue to enjoy sex.  Animals If you have cats or birds we just ask that you not change the litter or change the cage.  Please have someone else do this for you while you are on immunotherapy.   Food Safety During and After Cancer Treatment Food safety is important for people both during and after cancer treatment. Cancer and cancer treatments, such as chemotherapy, radiation therapy, and stem cell/bone marrow transplantation, often weaken the immune system. This makes it harder for your body to protect itself from foodborne illness, also called food poisoning. Foodborne illness is caused by eating food that contains harmful bacteria, parasites, or viruses.  Foods to avoid Some foods have a higher risk of becoming tainted with bacteria. These include: Marland Kitchen Unwashed fresh fruit and vegetables, especially leafy vegetables that can hide dirt and other contaminants . Raw sprouts, such as alfalfa sprouts . Raw or undercooked beef, especially ground beef, or other raw or undercooked meat and poultry . Fatty, fried, or spicy foods immediately before or after treatment.  These can sit heavy on your stomach and make you feel nauseous. . Raw or undercooked shellfish, such as oysters. . Sushi and sashimi, which often contain raw fish.  . Unpasteurized beverages, such as unpasteurized fruit juices, raw milk, raw yogurt, or cider . Undercooked eggs, such as soft boiled, over easy, and poached; raw, unpasteurized eggs; or foods made with raw egg, such as homemade raw cookie dough and homemade mayonnaise  Simple steps for food safety  Shop smart. . Do not buy food stored or displayed in an unclean area. . Do not buy bruised or damaged fruits or vegetables. . Do not buy cans that have cracks, dents, or bulges. . Pick  up foods that can spoil at the end of your shopping trip and store them in a cooler on the way home.  Prepare and clean up foods carefully. . Rinse all fresh fruits and vegetables under running water, and dry them with a clean  towel or paper towel. . Clean the top of cans before opening them. . After preparing food, wash your hands for 20 seconds with hot water and soap. Pay special attention to areas between fingers and under nails. . Clean your utensils and dishes with hot water and soap. Marland Kitchen Disinfect your kitchen and cutting boards using 1 teaspoon of liquid, unscented bleach mixed into 1 quart of water.    Dispose of old food. . Eat canned and packaged food before its expiration date (the "use by" or "best before" date). . Consume refrigerated leftovers within 3 to 4 days. After that time, throw out the food. Even if the food does not smell or look spoiled, it still may be unsafe. Some bacteria, such as Listeria, can grow even on foods stored in the refrigerator if they are kept for too long.  Take precautions when eating out. . At restaurants, avoid buffets and salad bars where food sits out for a long time and comes in contact with many people. Food can become contaminated when someone with a virus, often a norovirus, or another "bug" handles it. . Put any leftover food in a "to-go" container yourself, rather than having the server do it. And, refrigerate leftovers as soon as you get home. . Choose restaurants that are clean and that are willing to prepare your food as you order it cooked.     SYMPTOMS TO REPORT AS SOON AS POSSIBLE AFTER TREATMENT:   FEVER GREATER THAN 100.5 F  CHILLS WITH OR WITHOUT FEVER  NAUSEA AND VOMITING THAT IS NOT CONTROLLED WITH YOUR NAUSEA MEDICATION  UNUSUAL SHORTNESS OF BREATH  UNUSUAL BRUISING OR BLEEDING  TENDERNESS IN MOUTH AND THROAT WITH OR WITHOUT PRESENCE OF ULCERS  URINARY PROBLEMS  BOWEL PROBLEMS  UNUSUAL RASH      Wear  comfortable clothing and clothing appropriate for easy access to any Portacath or PICC line. Let us know if there is anything that we can do to make your therapy better!    What to do if you need assistance after hours or on the weekends: CALL [phone].  HOLD on the line, do not hang up.  You will hear multiple messages but at the end you will be connected with a nurse triage line.  They will contact the doctor if necessary.  Most of the time they will be able to assist you.  Do not call the hospital operator.     I have been informed and understand all of the instructions given to me and have received a copy. I have been instructed to call the clinic [phone] or my family physician as soon as possible for continued medical care, if indicated. I do not have any more questions at this time but understand that I may call the [facility] or the Patient Navigator at [phone] during office hours should I have questions or need assistance in obtaining follow-up care.

## 2019-08-15 ENCOUNTER — Other Ambulatory Visit: Payer: Self-pay

## 2019-08-15 ENCOUNTER — Inpatient Hospital Stay (HOSPITAL_BASED_OUTPATIENT_CLINIC_OR_DEPARTMENT_OTHER): Payer: Medicare Other | Admitting: Hematology

## 2019-08-15 ENCOUNTER — Inpatient Hospital Stay (HOSPITAL_COMMUNITY): Payer: Medicare Other

## 2019-08-15 ENCOUNTER — Encounter (HOSPITAL_COMMUNITY): Payer: Self-pay | Admitting: Hematology

## 2019-08-15 VITALS — BP 160/66 | HR 71 | Temp 97.1°F | Resp 18 | Wt 276.8 lb

## 2019-08-15 VITALS — BP 137/67 | HR 84 | Temp 97.6°F | Resp 18

## 2019-08-15 DIAGNOSIS — C8330 Diffuse large B-cell lymphoma, unspecified site: Secondary | ICD-10-CM

## 2019-08-15 DIAGNOSIS — I4892 Unspecified atrial flutter: Secondary | ICD-10-CM | POA: Diagnosis not present

## 2019-08-15 DIAGNOSIS — Z5112 Encounter for antineoplastic immunotherapy: Secondary | ICD-10-CM | POA: Diagnosis not present

## 2019-08-15 DIAGNOSIS — D591 Autoimmune hemolytic anemia, unspecified: Secondary | ICD-10-CM | POA: Diagnosis not present

## 2019-08-15 DIAGNOSIS — D5919 Other autoimmune hemolytic anemia: Secondary | ICD-10-CM

## 2019-08-15 DIAGNOSIS — Z8572 Personal history of non-Hodgkin lymphomas: Secondary | ICD-10-CM | POA: Diagnosis not present

## 2019-08-15 DIAGNOSIS — D509 Iron deficiency anemia, unspecified: Secondary | ICD-10-CM | POA: Diagnosis not present

## 2019-08-15 DIAGNOSIS — E118 Type 2 diabetes mellitus with unspecified complications: Secondary | ICD-10-CM

## 2019-08-15 DIAGNOSIS — J9601 Acute respiratory failure with hypoxia: Secondary | ICD-10-CM | POA: Diagnosis not present

## 2019-08-15 LAB — CBC WITH DIFFERENTIAL/PLATELET
Abs Immature Granulocytes: 0.02 10*3/uL (ref 0.00–0.07)
Basophils Absolute: 0 10*3/uL (ref 0.0–0.1)
Basophils Relative: 0 %
Eosinophils Absolute: 0 10*3/uL (ref 0.0–0.5)
Eosinophils Relative: 0 %
HCT: 32.6 % — ABNORMAL LOW (ref 39.0–52.0)
Hemoglobin: 10.4 g/dL — ABNORMAL LOW (ref 13.0–17.0)
Immature Granulocytes: 0 %
Lymphocytes Relative: 16 %
Lymphs Abs: 1.3 10*3/uL (ref 0.7–4.0)
MCH: 37.7 pg — ABNORMAL HIGH (ref 26.0–34.0)
MCHC: 31.9 g/dL (ref 30.0–36.0)
MCV: 118.1 fL — ABNORMAL HIGH (ref 80.0–100.0)
Monocytes Absolute: 0.9 10*3/uL (ref 0.1–1.0)
Monocytes Relative: 11 %
Neutro Abs: 5.9 10*3/uL (ref 1.7–7.7)
Neutrophils Relative %: 73 %
Platelets: 196 10*3/uL (ref 150–400)
RBC: 2.76 MIL/uL — ABNORMAL LOW (ref 4.22–5.81)
RDW: 20.8 % — ABNORMAL HIGH (ref 11.5–15.5)
WBC: 8 10*3/uL (ref 4.0–10.5)
nRBC: 0 % (ref 0.0–0.2)

## 2019-08-15 LAB — COMPREHENSIVE METABOLIC PANEL
ALT: 23 U/L (ref 0–44)
AST: 17 U/L (ref 15–41)
Albumin: 3.8 g/dL (ref 3.5–5.0)
Alkaline Phosphatase: 43 U/L (ref 38–126)
Anion gap: 10 (ref 5–15)
BUN: 18 mg/dL (ref 8–23)
CO2: 25 mmol/L (ref 22–32)
Calcium: 8.8 mg/dL — ABNORMAL LOW (ref 8.9–10.3)
Chloride: 105 mmol/L (ref 98–111)
Creatinine, Ser: 1.13 mg/dL (ref 0.61–1.24)
GFR calc Af Amer: >60 mL/min (ref >60)
GFR calc non Af Amer: >60 mL/min (ref >60)
Glucose, Bld: 81 mg/dL (ref 70–99)
Potassium: 3.9 mmol/L (ref 3.5–5.1)
Sodium: 140 mmol/L (ref 135–145)
Total Bilirubin: 2 mg/dL — ABNORMAL HIGH (ref 0.3–1.2)
Total Protein: 6.3 g/dL — ABNORMAL LOW (ref 6.5–8.1)

## 2019-08-15 LAB — HEPATITIS B SURFACE ANTIGEN: Hepatitis B Surface Ag: NONREACTIVE

## 2019-08-15 LAB — RETICULOCYTES
Immature Retic Fract: 23.4 % — ABNORMAL HIGH (ref 2.3–15.9)
RBC.: 2.75 MIL/uL — ABNORMAL LOW (ref 4.22–5.81)
Retic Count, Absolute: 313.8 10*3/uL — ABNORMAL HIGH (ref 19.0–186.0)
Retic Ct Pct: 11.3 % — ABNORMAL HIGH (ref 0.4–3.1)

## 2019-08-15 LAB — LACTATE DEHYDROGENASE: LDH: 292 U/L — ABNORMAL HIGH (ref 98–192)

## 2019-08-15 LAB — HEPATITIS B SURFACE ANTIBODY,QUALITATIVE: Hep B S Ab: REACTIVE — AB

## 2019-08-15 LAB — HEPATITIS C ANTIBODY: HCV Ab: NONREACTIVE

## 2019-08-15 LAB — HEPATITIS B CORE ANTIBODY, TOTAL: Hep B Core Total Ab: NONREACTIVE

## 2019-08-15 MED ORDER — FAMOTIDINE IN NACL 20-0.9 MG/50ML-% IV SOLN
20.0000 mg | Freq: Once | INTRAVENOUS | Status: AC
  Administered 2019-08-15: 10:00:00 20 mg via INTRAVENOUS
  Filled 2019-08-15: qty 50

## 2019-08-15 MED ORDER — DIPHENHYDRAMINE HCL 50 MG/ML IJ SOLN
50.0000 mg | Freq: Once | INTRAMUSCULAR | Status: AC
  Administered 2019-08-15: 10:00:00 50 mg via INTRAVENOUS
  Filled 2019-08-15: qty 1

## 2019-08-15 MED ORDER — SODIUM CHLORIDE 0.9 % IV SOLN: Freq: Once | INTRAVENOUS | Status: AC

## 2019-08-15 MED ORDER — SODIUM CHLORIDE 0.9 % IV SOLN
100.0000 mg | Freq: Once | INTRAVENOUS | Status: AC
  Administered 2019-08-15: 11:00:00 100 mg via INTRAVENOUS
  Filled 2019-08-15: qty 10

## 2019-08-15 MED ORDER — SODIUM CHLORIDE 0.9 % IV SOLN
800.0000 mg | Freq: Once | INTRAVENOUS | Status: AC
  Administered 2019-08-15: 13:00:00 800 mg via INTRAVENOUS
  Filled 2019-08-15: qty 50

## 2019-08-15 MED ORDER — PREDNISONE 5 MG PO TABS: 20.0000 mg | ORAL_TABLET | Freq: Every day | ORAL | 2 refills | Status: DC

## 2019-08-15 MED ORDER — PREDNISONE 20 MG PO TABS: 100.0000 mg | ORAL_TABLET | Freq: Every day | ORAL | 1 refills | Status: DC

## 2019-08-15 MED ORDER — ACETAMINOPHEN 325 MG PO TABS
650.0000 mg | ORAL_TABLET | Freq: Once | ORAL | Status: AC
  Administered 2019-08-15: 10:00:00 650 mg via ORAL
  Filled 2019-08-15: qty 2

## 2019-08-15 NOTE — Progress Notes (Signed)
Immunotherapy education packet given and discussed with pt in detail.  Discussed diagnosis, staging, tx regimen, and intent of tx.  Reviewed immunotherapy medications and side effects.  Instructed on how to manage side effects at home, and when to call the clinic.  Importance of fever/chills discussed with pt and family. Discussed precautions to implement at home after receiving tx, as well as self care strategies. Phone numbers provided for clinic during regular working hours, also how to reach the clinic after hours and on weekends. Pt provided the opportunity to ask questions - all questions answered to pt's satisfaction.     

## 2019-08-15 NOTE — Patient Instructions (Signed)
[facility] Discharge Instructions for Patients Receiving Chemotherapy  Today you received the following chemotherapy agents   To help prevent nausea and vomiting after your treatment, we encourage you to take your nausea medication   If you develop nausea and vomiting that is not controlled by your nausea medication, call the clinic.   BELOW ARE SYMPTOMS THAT SHOULD BE REPORTED IMMEDIATELY:  *FEVER GREATER THAN 100.5 F  *CHILLS WITH OR WITHOUT FEVER  NAUSEA AND VOMITING THAT IS NOT CONTROLLED WITH YOUR NAUSEA MEDICATION  *UNUSUAL SHORTNESS OF BREATH  *UNUSUAL BRUISING OR BLEEDING  TENDERNESS IN MOUTH AND THROAT WITH OR WITHOUT PRESENCE OF ULCERS  *URINARY PROBLEMS  *BOWEL PROBLEMS  UNUSUAL RASH Items with * indicate a potential emergency and should be followed up as soon as possible.  Feel free to call the clinic should you have any questions or concerns. The clinic phone number is [phone].  Please show the CHEMO ALERT CARD at check-in to the Emergency Department and triage nurse.   

## 2019-08-15 NOTE — Patient Instructions (Addendum)
[facility] Discharge Instructions  You were seen today by Dr. Delton Coombes. He went over your recent lab results. He will refill your prednisone, please start taking 20mg  tomorrow. He will see you back in 1 week for labs, treatment and follow up.   Thank you for choosing [facility] at [facility] to provide your oncology and hematology care.  To afford each patient quality time with our provider, please arrive at least 15 minutes before your scheduled appointment time.   If you have a lab appointment with the [facility] please come in thru the  Main Entrance and check in at the main information desk  You need to re-schedule your appointment should you arrive 10 or more minutes late.  We strive to give you quality time with our providers, and arriving late affects you and other patients whose appointments are after yours.  Also, if you no show three or more times for appointments you may be dismissed from the clinic at the providers discretion.     Again, thank you for choosing [facility].  Our hope is that these requests will decrease the amount of time that you wait before being seen by our physicians.       _____________________________________________________________  Should you have questions after your visit to [facility], please contact our office at [phone] between the hours of 8:00 a.m. and 4:30 p.m.  Voicemails left after 4:00 p.m. will not be returned until the following business day.  For prescription refill requests, have your pharmacy contact our office and allow 72 hours.    Cancer Center Support Programs:   > Cancer Support Group  2nd Tuesday of the month 1pm-2pm, Journey Room

## 2019-08-15 NOTE — Progress Notes (Signed)
Labs reviewed with MD today, proceed with treatment per MD.   Treatment given per orders. Patient tolerated it well without problems. Vitals stable and discharged home from clinic via wheelchair.  Follow up as scheduled.

## 2019-08-15 NOTE — Progress Notes (Signed)
[address], [place] [address]   CLINIC:  Medical Oncology/Hematology  PCP:  [facility], [place] [address] [phone]   REASON FOR VISIT:  Follow-up for large B-cell lymphoma and newly diagnosed autoimmune hemolytic anemia.  CURRENT THERAPY: Prednisone.  BRIEF ONCOLOGIC HISTORY:  Oncology History Overview Note  S/P chemotherapy following stabilization of the spine.  Initial biopsy was on 07/13/2005 on his bone marrow.  Thoracic vertebra was biopsied on 02/04/2005 and mediastinum on 02/03/2005.  S/P R-CHOP and achieved an incomplete response, then underwent ICE chemotherapy pre-transplant and total body radiation with autologous stem cell transplant on 01/15/2006 and maintained on Rituxan for 2 years.  Thus far without recurrence.   DLBCL (diffuse large B cell lymphoma) ([facility])  01/27/2005 Imaging   PET- diffuse activity  involving the L cervical area, extensive activity in the mediastinumn particularly in the R paratracheal area and to a lesser degree in the anterior mediastinum.  Lg area in porta caval nodes in upper abd and diffuse bone activity   02/03/2005 Pathology Results   Mediastinal lymph node demonstrating diffuse large cell lymphoma, B-type.  Vertebral bone biopsy demonstrating diffuse large b-cell lymphoma   03/11/2005 - 08/04/2005 Chemotherapy   R-CHOP x 8 cycles   05/04/2005 Imaging   PET- near resolution of previously identified uptake.   07/13/2005 Bone Marrow Biopsy   Bone marrow aspiration and biopsy is negative for lymphoma   08/31/2005 Imaging   PET- further response to therapy with findings consistent with residual disease.  Hypermetabolic activity identified within the anterior mediastinal lymph node (smaller but still pathologic).  Borderline hypermetabolic small right pleural effusion   10/20/2005 - 11/13/2005 Chemotherapy   R-ICE x 2 cycles at [facility]   01/08/2006 - 01/09/2006 Chemotherapy   Cyclophosphamide 600 mg daily x 2   01/10/2006 - 01/14/2006 Radiation Therapy   Total body radiation twice daily   01/15/2006 Bone Marrow Transplant   Stem cell transplant at [facility]   04/05/2006 - 12/05/2007 Chemotherapy   Rituxan weekly x 4 every 6 months x 2 years      CANCER STAGING: Cancer Staging DLBCL (diffuse large B cell lymphoma) (HCC) Staging form: Lymphoid Neoplasms, AJCC 6th Edition - Clinical: Stage IV - Signed by Baird Cancer, PA on 04/29/2011    INTERVAL HISTORY:  Mr. [name] 68 y.o. male seen for follow-up of autoimmune hemolytic anemia with a history of diffuse large B-cell lymphoma.  He underwent PET scan yesterday.  He ran out of prednisone yesterday.  Appetite is 100%.  Energy levels are 75%.  Shortness of breath on exertion is present.  Leg swelling is also stable.  Denies any fevers or chills.  No nausea, vomiting, diarrhea or constipation.  No stomach upset.   REVIEW OF SYSTEMS:  Review of Systems  Respiratory: Positive for shortness of breath.   Cardiovascular: Positive for leg swelling.  All other systems reviewed and are negative.    PAST MEDICAL/SURGICAL HISTORY:  Past Medical History:  Diagnosis Date  . Asthma    as child  . Diabetes mellitus without complication ([facility])   . DLBCL (diffuse large B cell lymphoma) ([facility]) 02/28/2009  . Edema   . Heart failure, diastolic, chronic ([place])    patient denies  . Hyperlipidemia, mixed   . Hypertension   . IDA (iron deficiency anemia)   . Large cell lymphoma ([facility]) 01/2005   autologous stem cell transplant 12/2005  . Obesity, morbid (more than 100 lbs  over ideal weight or BMI > 40) (HCC)   . Venous insufficiency 04/29/2011   Past Surgical History:  Procedure Laterality Date  . BACK SURGERY  2006   T6 vertebrae removed/titanium placed  . CATARACT EXTRACTION W/PHACO Right 03/31/2019   Procedure: CATARACT EXTRACTION PHACO AND INTRAOCULAR LENS PLACEMENT (IOC);  Surgeon: Baruch Goldmann, MD;  Location: [facility];   Service: Ophthalmology;  Laterality: Right;  CDE: 3.21  . CATARACT EXTRACTION W/PHACO Left 04/14/2019   Procedure: CATARACT EXTRACTION PHACO AND INTRAOCULAR LENS PLACEMENT (IOC);  Surgeon: Baruch Goldmann, MD;  Location: [facility];  Service: Ophthalmology;  Laterality: Left;  left - pt knows to arrive at 7:45, CDE: 3.55  . COLONOSCOPY  11/24/2004   Polyps in the left colon ablated/removed as described above.  Two submucosal lesions consistent with lipomas as described above not  manipulated./ Normal rectum  . COLONOSCOPY  06/20/2012   MULTIPLE RECTAL AND COLONIC POLYPS  . COLONOSCOPY N/A 03/08/2015   RMR: Capacious, redundant colon. Multiple colonic and rectosigmoid polyps removed. ablated as described above. colonic lipoma abnormal appearing terminal ileum likely a variant of normal). Howeverwith history  biopsies obtained.   . ESOPHAGOGASTRODUODENOSCOPY N/A 03/08/2015   RMR: Hiatal hernia Polypoid gastric mucosa with multiple gastric polyps. largest polyp removed via snare polypectomgy hemostasis clip placed at base. Status post gastric biopsy. Status post video capsule placement.   Marland Kitchen GIVENS CAPSULE STUDY N/A 03/08/2015   Procedure: GIVENS CAPSULE STUDY;  Surgeon: Daneil Dolin, MD;  Location: [facility];  Service: Endoscopy;  Laterality: N/A;  . LIMBAL STEM CELL TRANSPLANT    . LUNG BIOPSY  6/06  . MULTIPLE TOOTH EXTRACTIONS  04/2005  . port a cath placement    . PORT-A-CATH REMOVAL  09/08/2012   Procedure: REMOVAL PORT-A-CATH;  Surgeon: Melrose Nakayama, MD;  Location: [facility];  Service: Thoracic;  Laterality: N/A;     SOCIAL HISTORY:  Social History   Socioeconomic History  . Marital status: Married    Spouse name: Not on file  . Number of children: 2  . Years of education: Not on file  . Highest education level: Not on file  Occupational History  . Occupation: disability due to back    Employer: UNEMPLOYED  Tobacco Use  . Smoking status: Former Smoker    Packs/day: 0.50     Years: 15.00    Pack years: 7.50    Quit date: 11/11/2002    Years since quitting: 16.7  . Smokeless tobacco: Never Used  Substance and Sexual Activity  . Alcohol use: No    Alcohol/week: 0.0 standard drinks  . Drug use: No  . Sexual activity: Yes    Birth control/protection: None  Other Topics Concern  . Not on file  Social History Narrative   Married   No regular exercise   Social Determinants of Health   Financial Resource Strain:   . Difficulty of Paying Living Expenses: Not on file  Food Insecurity:   . Worried About Charity fundraiser in the Last Year: Not on file  . Ran Out of Food in the Last Year: Not on file  Transportation Needs:   . Lack of Transportation (Medical): Not on file  . Lack of Transportation (Non-Medical): Not on file  Physical Activity:   . Days of Exercise per Week: Not on file  . Minutes of Exercise per Session: Not on file  Stress:   . Feeling of Stress : Not on file  Social Connections:   .  Frequency of Communication with Friends and Family: Not on file  . Frequency of Social Gatherings with Friends and Family: Not on file  . Attends Religious Services: Not on file  . Active Member of Clubs or Organizations: Not on file  . Attends Archivist Meetings: Not on file  . Marital Status: Not on file  Intimate Partner Violence:   . Fear of Current or Ex-Partner: Not on file  . Emotionally Abused: Not on file  . Physically Abused: Not on file  . Sexually Abused: Not on file    FAMILY HISTORY:  Family History  Problem Relation Age of Onset  . Cancer Mother        lung  . Cancer Father        prostate  . Colon cancer Neg Hx   . Diabetes Neg Hx     CURRENT MEDICATIONS:  Outpatient Encounter Medications as of 08/15/2019  Medication Sig  . apixaban (ELIQUIS) 5 MG TABS tablet Take 1 tablet (5 mg total) by mouth 2 (two) times daily.  . clotrimazole-betamethasone (LOTRISONE) cream Apply 1 application topically 2 (two) times daily as  needed (leg infection).   Marland Kitchen glimepiride (AMARYL) 1 MG tablet Take 1 tablet (1 mg total) by mouth 2 (two) times daily with a meal. take 1 tablet by mouth twice daily WITH BREAKFAST  . metoprolol tartrate (LOPRESSOR) 25 MG tablet Take 1 tablet (25 mg total) by mouth 2 (two) times daily.  . pantoprazole (PROTONIX) 40 MG tablet Take 1 tablet (40 mg total) by mouth daily.  . rosuvastatin (CRESTOR) 40 MG tablet Take 40 mg by mouth daily.  . furosemide (LASIX) 20 MG tablet Take 20 mg by mouth daily as needed for fluid or edema.   . predniSONE (DELTASONE) 20 MG tablet Take 5 tablets (100 mg total) by mouth daily with breakfast.  . [DISCONTINUED] predniSONE (DELTASONE) 5 MG tablet Take 4 tablets (20 mg total) by mouth daily with breakfast.   No facility-administered encounter medications on file as of 08/15/2019.    ALLERGIES:  Allergies  Allergen Reactions  . Penicillins Rash    Has patient had a PCN reaction causing immediate rash, facial/tongue/throat swelling, SOB or lightheadedness with hypotension: Unknown Has patient had a PCN reaction causing severe rash involving mucus membranes or skin necrosis: Unknown Has patient had a PCN reaction that required hospitalization: Unknown Has patient had a PCN reaction occurring within the last 10 years: No If all of the above answers are "NO", then may proceed with Cephalosporin use.      PHYSICAL EXAM:  ECOG Performance status: 1  Vitals:   08/15/19 0838  BP: (!) 160/66  Pulse: 71  Resp: 18  Temp: (!) 97.1 F (36.2 C)  SpO2: 96%   Filed Weights   08/15/19 0838  Weight: 276 lb 12.8 oz (125.6 kg)    Physical Exam Vitals reviewed.  Constitutional:      Appearance: Normal appearance.  Cardiovascular:     Rate and Rhythm: Normal rate and regular rhythm.     Heart sounds: Normal heart sounds.  Pulmonary:     Effort: Pulmonary effort is normal.     Breath sounds: Normal breath sounds.  Abdominal:     General: There is no distension.       Palpations: Abdomen is soft. There is no mass.  Lymphadenopathy:     Cervical: No cervical adenopathy.  Skin:    General: Skin is warm.  Neurological:     General: No  focal deficit present.     Mental Status: He is alert and oriented to person, place, and time.  Psychiatric:        Mood and Affect: Mood normal.        Behavior: Behavior normal.      LABORATORY DATA:  I have reviewed the labs as listed.  CBC    Component Value Date/Time   WBC 8.0 08/15/2019 0804   RBC 2.75 (L) 08/15/2019 1003   RBC 2.76 (L) 08/15/2019 0804   HGB 10.4 (L) 08/15/2019 0804   HCT 32.6 (L) 08/15/2019 0804   PLT 196 08/15/2019 0804   MCV 118.1 (H) 08/15/2019 0804   MCH 37.7 (H) 08/15/2019 0804   MCHC 31.9 08/15/2019 0804   RDW 20.8 (H) 08/15/2019 0804   LYMPHSABS 1.3 08/15/2019 0804   MONOABS 0.9 08/15/2019 0804   EOSABS 0.0 08/15/2019 0804   BASOSABS 0.0 08/15/2019 0804   CMP Latest Ref Rng & Units 08/15/2019 08/10/2019 08/07/2019  Glucose 70 - 99 mg/dL 81 82 111(H)  BUN 8 - 23 mg/dL 18 19 17   Creatinine 0.61 - 1.24 mg/dL 1.13 1.01 1.13  Sodium 135 - 145 mmol/L 140 142 140  Potassium 3.5 - 5.1 mmol/L 3.9 3.8 4.0  Chloride 98 - 111 mmol/L 105 106 106  CO2 22 - 32 mmol/L 25 24 24   Calcium 8.9 - 10.3 mg/dL 8.8(L) 9.1 9.5  Total Protein 6.5 - 8.1 g/dL 6.3(L) 6.9 7.1  Total Bilirubin 0.3 - 1.2 mg/dL 2.0(H) 3.0(H) 3.3(H)  Alkaline Phos 38 - 126 U/L 43 49 49  AST 15 - 41 U/L 17 20 21   ALT 0 - 44 U/L 23 19 10        DIAGNOSTIC IMAGING:  I have independently reviewed the scans and discussed with the patient.   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  Autoimmune hemolytic anemia: -Admission to the hospital on 08/03/2019 with hemoglobin of 6.3.  Direct Coombs test IgG was positive.  LDH and reticulocyte count was elevated. -Prednisone 120 mg daily started on 08/05/2019. -He reports tolerating prednisone without any major problems.  He ran out of prednisone  yesterday. -I have reviewed labs today.  Hemoglobin is 10.4.  Total bilirubin is 2.0.  LDH is 292.  Reticulocyte count is 11%. -I have recommended weekly rituximab x4 for a deeper and prolonged response. -We talked about side effects in detail.  We reviewed his hepatitis panel which was negative. -He will proceed with first dose today.  We will give him prednisone prior to infusion as he did not take any today. -We have sent a prescription for prednisone 20 mg tablets.  I will cut back on the dose to 100 mg daily. -He will come back in 1 week for his second weekly dose of rituximab.  If hemoglobin goes above 11 g/dL, would recommend further decreasing prednisone to 80 mg daily.  2.  Diffuse large B-cell lymphoma: -Originally diagnosed in 2006, treated with R-CHOP (03/11/2005 -08/04/2005) with complete response on PET scan. -Relapse of lymphoma on 08/31/2005.  He was given ICE chemotherapy followed by stem cell transplant on 01/15/2006. -He received rituximab weekly x4 followed by every 6 months for 2 years from 04/05/2006 through 12/05/2007. -CT CAP on 08/04/2019 showed increase in size of the pretracheal lymph node compared to prior scans.  Rest of the lymphadenopathy is stable. -I have reviewed results of PET scan dated 08/14/2019 which did not show any evidence of recurrence of lymphoma.  Orders placed this encounter:  Orders Placed This Encounter  Procedures  . Lactate dehydrogenase  . Reticulocytes      Derek Jack, MD [place] [phone]

## 2019-08-15 NOTE — Assessment & Plan Note (Addendum)
1.  Autoimmune hemolytic anemia: -Admission to the hospital on 08/03/2019 with hemoglobin of 6.3.  Direct Coombs test IgG was positive.  LDH and reticulocyte count was elevated. -Prednisone 120 mg daily started on 08/05/2019. -He reports tolerating prednisone without any major problems.  He ran out of prednisone yesterday. -I have reviewed labs today.  Hemoglobin is 10.4.  Total bilirubin is 2.0.  LDH is 292.  Reticulocyte count is 11%. -I have recommended weekly rituximab x4 for a deeper and prolonged response. -We talked about side effects in detail.  We reviewed his hepatitis panel which was negative. -He will proceed with first dose today.  We will give him prednisone prior to infusion as he did not take any today. -We have sent a prescription for prednisone 20 mg tablets.  I will cut back on the dose to 100 mg daily. -He will come back in 1 week for his second weekly dose of rituximab.  If hemoglobin goes above 11 g/dL, would recommend further decreasing prednisone to 80 mg daily.  2.  Diffuse large B-cell lymphoma: -Originally diagnosed in 2006, treated with R-CHOP (03/11/2005 -08/04/2005) with complete response on PET scan. -Relapse of lymphoma on 08/31/2005.  He was given ICE chemotherapy followed by stem cell transplant on 01/15/2006. -He received rituximab weekly x4 followed by every 6 months for 2 years from 04/05/2006 through 12/05/2007. -CT CAP on 08/04/2019 showed increase in size of the pretracheal lymph node compared to prior scans.  Rest of the lymphadenopathy is stable. -I have reviewed results of PET scan dated 08/14/2019 which did not show any evidence of recurrence of lymphoma.

## 2019-08-15 NOTE — Progress Notes (Signed)
08/15/19  Ok to proceed with rituximab-pvvr while awaiting Hepatitis B panel.  Give rituxumab-pvvr today as 2 infusions -  1st : 100 mg in 100 ml NS to ensure tolerates infusion then give remainder 800 mg/250 ml as 2nd bag.  T.O. Dr Rhys Martini, PharmD

## 2019-08-15 NOTE — Patient Outreach (Signed)
[facility]) Care Management  08/15/2019  [name] October 26, 1950 [id number]   Medication Adherence call to Mr. [name] Telephone call to Patient regarding Medication Adherence unable to reach patient. [name] is showing past due on Rosuvastatin 40 mg under [place].   [facility] Management Direct Dial [phone]  Fax [phone] [email or web address]

## 2019-08-16 ENCOUNTER — Other Ambulatory Visit (HOSPITAL_COMMUNITY): Payer: Self-pay | Admitting: *Deleted

## 2019-08-16 ENCOUNTER — Encounter (HOSPITAL_COMMUNITY): Payer: Self-pay | Admitting: *Deleted

## 2019-08-16 NOTE — Progress Notes (Signed)
24 hour post-treatment follow up:     I spoke with patient this morning via telephone.  I asked about his recent visit and if he had any follow up questions or concerns.  Patient reports that the is a little tired but overall doing well. He has not picked up his prescription for prednisone yet but has plans to do that today.  I made sure he was aware of the dosing and he needs to take 100 mg all at one time early mornings.  He verbalizes understanding.

## 2019-08-21 ENCOUNTER — Other Ambulatory Visit: Payer: Self-pay

## 2019-08-21 NOTE — Patient Outreach (Signed)
[facility]) Care [name]  08/21/2019  [name] 1951/02/22 [id number]  Medication Adherence call to Mr. [name] [place] [facility] Verify spoke with patient he is past due on Rosuvastatin 40 mg,patient explain he takes 1 tablet daily he explain he has enough for 20 more days and then he will order,patient is showing past due under [place].  [name] Direct Dial [phone]  Fax [phone] [name]

## 2019-08-22 ENCOUNTER — Other Ambulatory Visit: Payer: Self-pay

## 2019-08-22 ENCOUNTER — Inpatient Hospital Stay (HOSPITAL_BASED_OUTPATIENT_CLINIC_OR_DEPARTMENT_OTHER): Payer: Medicare Other | Admitting: Nurse Practitioner

## 2019-08-22 ENCOUNTER — Inpatient Hospital Stay (HOSPITAL_COMMUNITY): Payer: Medicare Other

## 2019-08-22 VITALS — BP 151/84 | HR 64 | Temp 98.5°F | Resp 16

## 2019-08-22 DIAGNOSIS — D5919 Other autoimmune hemolytic anemia: Secondary | ICD-10-CM

## 2019-08-22 DIAGNOSIS — Z5112 Encounter for antineoplastic immunotherapy: Secondary | ICD-10-CM | POA: Diagnosis not present

## 2019-08-22 DIAGNOSIS — Z8572 Personal history of non-Hodgkin lymphomas: Secondary | ICD-10-CM | POA: Diagnosis not present

## 2019-08-22 DIAGNOSIS — D591 Autoimmune hemolytic anemia, unspecified: Secondary | ICD-10-CM | POA: Diagnosis not present

## 2019-08-22 DIAGNOSIS — D509 Iron deficiency anemia, unspecified: Secondary | ICD-10-CM | POA: Diagnosis not present

## 2019-08-22 DIAGNOSIS — C8330 Diffuse large B-cell lymphoma, unspecified site: Secondary | ICD-10-CM

## 2019-08-22 LAB — IRON AND TIBC
Iron: 66 ug/dL (ref 45–182)
Saturation Ratios: 17 % — ABNORMAL LOW (ref 17.9–39.5)
TIBC: 392 ug/dL (ref 250–450)
UIBC: 326 ug/dL

## 2019-08-22 LAB — CBC WITH DIFFERENTIAL/PLATELET
Abs Immature Granulocytes: 0.03 10*3/uL (ref 0.00–0.07)
Basophils Absolute: 0 10*3/uL (ref 0.0–0.1)
Basophils Relative: 0 %
Eosinophils Absolute: 0 10*3/uL (ref 0.0–0.5)
Eosinophils Relative: 0 %
HCT: 36.1 % — ABNORMAL LOW (ref 39.0–52.0)
Hemoglobin: 11.7 g/dL — ABNORMAL LOW (ref 13.0–17.0)
Immature Granulocytes: 0 %
Lymphocytes Relative: 11 %
Lymphs Abs: 0.9 10*3/uL (ref 0.7–4.0)
MCH: 36.4 pg — ABNORMAL HIGH (ref 26.0–34.0)
MCHC: 32.4 g/dL (ref 30.0–36.0)
MCV: 112.5 fL — ABNORMAL HIGH (ref 80.0–100.0)
Monocytes Absolute: 0.7 10*3/uL (ref 0.1–1.0)
Monocytes Relative: 8 %
Neutro Abs: 7 10*3/uL (ref 1.7–7.7)
Neutrophils Relative %: 81 %
Platelets: 135 10*3/uL — ABNORMAL LOW (ref 150–400)
RBC: 3.21 MIL/uL — ABNORMAL LOW (ref 4.22–5.81)
RDW: 16.7 % — ABNORMAL HIGH (ref 11.5–15.5)
WBC: 8.7 10*3/uL (ref 4.0–10.5)
nRBC: 0 % (ref 0.0–0.2)

## 2019-08-22 LAB — VITAMIN D 25 HYDROXY (VIT D DEFICIENCY, FRACTURES): Vit D, 25-Hydroxy: 11.87 ng/mL — ABNORMAL LOW (ref 30–100)

## 2019-08-22 LAB — COMPREHENSIVE METABOLIC PANEL
ALT: 36 U/L (ref 0–44)
AST: 15 U/L (ref 15–41)
Albumin: 3.7 g/dL (ref 3.5–5.0)
Alkaline Phosphatase: 42 U/L (ref 38–126)
Anion gap: 10 (ref 5–15)
BUN: 18 mg/dL (ref 8–23)
CO2: 25 mmol/L (ref 22–32)
Calcium: 9 mg/dL (ref 8.9–10.3)
Chloride: 106 mmol/L (ref 98–111)
Creatinine, Ser: 0.91 mg/dL (ref 0.61–1.24)
GFR calc Af Amer: >60 mL/min (ref >60)
GFR calc non Af Amer: >60 mL/min (ref >60)
Glucose, Bld: 111 mg/dL — ABNORMAL HIGH (ref 70–99)
Potassium: 4 mmol/L (ref 3.5–5.1)
Sodium: 141 mmol/L (ref 135–145)
Total Bilirubin: 1 mg/dL (ref 0.3–1.2)
Total Protein: 6.2 g/dL — ABNORMAL LOW (ref 6.5–8.1)

## 2019-08-22 LAB — FERRITIN: Ferritin: 48 ng/mL (ref 24–336)

## 2019-08-22 LAB — FOLATE: Folate: 7 ng/mL (ref >5.9)

## 2019-08-22 LAB — VITAMIN B12: Vitamin B-12: 324 pg/mL (ref 180–914)

## 2019-08-22 LAB — LACTATE DEHYDROGENASE: LDH: 205 U/L — ABNORMAL HIGH (ref 98–192)

## 2019-08-22 MED ORDER — FAMOTIDINE IN NACL 20-0.9 MG/50ML-% IV SOLN
INTRAVENOUS | Status: AC
  Filled 2019-08-22: qty 50

## 2019-08-22 MED ORDER — FAMOTIDINE IN NACL 20-0.9 MG/50ML-% IV SOLN
20.0000 mg | Freq: Once | INTRAVENOUS | Status: AC
  Administered 2019-08-22: 10:00:00 20 mg via INTRAVENOUS

## 2019-08-22 MED ORDER — DIPHENHYDRAMINE HCL 50 MG/ML IJ SOLN
50.0000 mg | Freq: Once | INTRAMUSCULAR | Status: AC
  Administered 2019-08-22: 10:00:00 50 mg via INTRAVENOUS

## 2019-08-22 MED ORDER — DIPHENHYDRAMINE HCL 50 MG/ML IJ SOLN
INTRAMUSCULAR | Status: AC
  Filled 2019-08-22: qty 1

## 2019-08-22 MED ORDER — ACETAMINOPHEN 325 MG PO TABS
650.0000 mg | ORAL_TABLET | Freq: Once | ORAL | Status: AC
  Administered 2019-08-22: 650 mg via ORAL

## 2019-08-22 MED ORDER — SODIUM CHLORIDE 0.9 % IV SOLN: Freq: Once | INTRAVENOUS | Status: AC

## 2019-08-22 MED ORDER — ACETAMINOPHEN 325 MG PO TABS
ORAL_TABLET | ORAL | Status: AC
  Filled 2019-08-22: qty 2

## 2019-08-22 MED ORDER — SODIUM CHLORIDE 0.9 % IV SOLN
375.0000 mg/m2 | Freq: Once | INTRAVENOUS | Status: AC
  Administered 2019-08-22: 900 mg via INTRAVENOUS
  Filled 2019-08-22: qty 40

## 2019-08-22 NOTE — Progress Notes (Signed)
[name], [place] [address]   CLINIC:  Medical Oncology/Hematology  PCP:  [facility], [place] [address] [phone]   REASON FOR VISIT: Follow-up for autoimmune hemolytic anemia  CURRENT THERAPY: Rituximab and prednisone  BRIEF ONCOLOGIC HISTORY:  Oncology History Overview Note  S/P chemotherapy following stabilization of the spine.  Initial biopsy was on 07/13/2005 on his bone marrow.  Thoracic vertebra was biopsied on 02/04/2005 and mediastinum on 02/03/2005.  S/P R-CHOP and achieved an incomplete response, then underwent ICE chemotherapy pre-transplant and total body radiation with autologous stem cell transplant on 01/15/2006 and maintained on Rituxan for 2 years.  Thus far without recurrence.   DLBCL (diffuse large B cell lymphoma) ([place])  01/27/2005 Imaging   PET- diffuse activity  involving the L cervical area, extensive activity in the mediastinumn particularly in the R paratracheal area and to a lesser degree in the anterior mediastinum.  Lg area in porta caval nodes in upper abd and diffuse bone activity   02/03/2005 Pathology Results   Mediastinal lymph node demonstrating diffuse large cell lymphoma, B-type.  Vertebral bone biopsy demonstrating diffuse large b-cell lymphoma   03/11/2005 - 08/04/2005 Chemotherapy   R-CHOP x 8 cycles   05/04/2005 Imaging   PET- near resolution of previously identified uptake.   07/13/2005 Bone Marrow Biopsy   Bone marrow aspiration and biopsy is negative for lymphoma   08/31/2005 Imaging   PET- further response to therapy with findings consistent with residual disease.  Hypermetabolic activity identified within the anterior mediastinal lymph node (smaller but still pathologic).  Borderline hypermetabolic small right pleural effusion   10/20/2005 - 11/13/2005 Chemotherapy   R-ICE x 2 cycles at [facility]   01/08/2006 - 01/09/2006 Chemotherapy   Cyclophosphamide 600 mg daily x 2   01/10/2006 - 01/14/2006 Radiation Therapy   Total body radiation twice daily   01/15/2006 Bone Marrow Transplant   Stem cell transplant at [facility]   04/05/2006 - 12/05/2007 Chemotherapy   Rituxan weekly x 4 every 6 months x 2 years      CANCER STAGING: Cancer Staging DLBCL (diffuse large B cell lymphoma) (HCC) Staging form: Lymphoid Neoplasms, AJCC 6th Edition - Clinical: Stage IV - Signed by Baird Cancer, PA on 04/29/2011    INTERVAL HISTORY:  [name] 68 y.o. male returns for routine follow-up for autoimmune hemolytic anemia.  Patient reports he did well with his treatment last week.  He has no complaints at this time. Denies any nausea, vomiting, or diarrhea. Denies any new pains. Had not noticed any recent bleeding such as epistaxis, hematuria or hematochezia. Denies recent chest pain on exertion, shortness of breath on minimal exertion, pre-syncopal episodes, or palpitations. Denies any numbness or tingling in hands or feet. Denies any recent fevers, infections, or recent hospitalizations. Patient reports appetite at 75% and energy level at 75%.  He is eating well maintain his weight at this time.   REVIEW OF SYSTEMS:  Review of Systems  All other systems reviewed and are negative.    PAST MEDICAL/SURGICAL HISTORY:  Past Medical History:  Diagnosis Date  . Asthma    as child  . Diabetes mellitus without complication ([place])   . DLBCL (diffuse large B cell lymphoma) ([place]) 02/28/2009  . Edema   . Heart failure, diastolic, chronic ([place])    patient denies  . Hyperlipidemia, mixed   . Hypertension   . IDA (iron deficiency anemia)   . Large cell lymphoma ([place])  01/2005   autologous stem cell transplant 12/2005  . Obesity, morbid (more than 100 lbs over ideal weight or BMI > 40) (HCC)   . Venous insufficiency 04/29/2011   Past Surgical History:  Procedure Laterality Date  . BACK SURGERY  2006   T6 vertebrae removed/titanium placed  . CATARACT EXTRACTION W/PHACO Right 03/31/2019     Procedure: CATARACT EXTRACTION PHACO AND INTRAOCULAR LENS PLACEMENT (IOC);  Surgeon: Baruch Goldmann, MD;  Location: [facility];  Service: Ophthalmology;  Laterality: Right;  CDE: 3.21  . CATARACT EXTRACTION W/PHACO Left 04/14/2019   Procedure: CATARACT EXTRACTION PHACO AND INTRAOCULAR LENS PLACEMENT (IOC);  Surgeon: Baruch Goldmann, MD;  Location: [facility];  Service: Ophthalmology;  Laterality: Left;  left - pt knows to arrive at 7:45, CDE: 3.55  . COLONOSCOPY  11/24/2004   Polyps in the left colon ablated/removed as described above.  Two submucosal lesions consistent with lipomas as described above not  manipulated./ Normal rectum  . COLONOSCOPY  06/20/2012   MULTIPLE RECTAL AND COLONIC POLYPS  . COLONOSCOPY N/A 03/08/2015   RMR: Capacious, redundant colon. Multiple colonic and rectosigmoid polyps removed. ablated as described above. colonic lipoma abnormal appearing terminal ileum likely a variant of normal). Howeverwith history  biopsies obtained.   . ESOPHAGOGASTRODUODENOSCOPY N/A 03/08/2015   RMR: Hiatal hernia Polypoid gastric mucosa with multiple gastric polyps. largest polyp removed via snare polypectomgy hemostasis clip placed at base. Status post gastric biopsy. Status post video capsule placement.   Marland Kitchen GIVENS CAPSULE STUDY N/A 03/08/2015   Procedure: GIVENS CAPSULE STUDY;  Surgeon: Daneil Dolin, MD;  Location: [facility];  Service: Endoscopy;  Laterality: N/A;  . LIMBAL STEM CELL TRANSPLANT    . LUNG BIOPSY  6/06  . MULTIPLE TOOTH EXTRACTIONS  04/2005  . port a cath placement    . PORT-A-CATH REMOVAL  09/08/2012   Procedure: REMOVAL PORT-A-CATH;  Surgeon: Melrose Nakayama, MD;  Location: [facility];  Service: Thoracic;  Laterality: N/A;     SOCIAL HISTORY:  Social History   Socioeconomic History  . Marital status: Married    Spouse name: Not on file  . Number of children: 2  . Years of education: Not on file  . Highest education level: Not on file  Occupational History  .  Occupation: disability due to back    Employer: UNEMPLOYED  Tobacco Use  . Smoking status: Former Smoker    Packs/day: 0.50    Years: 15.00    Pack years: 7.50    Quit date: 11/11/2002    Years since quitting: 16.7  . Smokeless tobacco: Never Used  Substance and Sexual Activity  . Alcohol use: No    Alcohol/week: 0.0 standard drinks  . Drug use: No  . Sexual activity: Yes    Birth control/protection: None  Other Topics Concern  . Not on file  Social History Narrative   Married   No regular exercise   Social Determinants of Health   Financial Resource Strain:   . Difficulty of Paying Living Expenses: Not on file  Food Insecurity:   . Worried About Charity fundraiser in the Last Year: Not on file  . Ran Out of Food in the Last Year: Not on file  Transportation Needs:   . Lack of Transportation (Medical): Not on file  . Lack of Transportation (Non-Medical): Not on file  Physical Activity:   . Days of Exercise per Week: Not on file  . Minutes of Exercise per Session: Not on file  Stress:   . Feeling of Stress : Not on file  Social Connections:   . Frequency of Communication with Friends and Family: Not on file  . Frequency of Social Gatherings with Friends and Family: Not on file  . Attends Religious Services: Not on file  . Active Member of Clubs or Organizations: Not on file  . Attends Archivist Meetings: Not on file  . Marital Status: Not on file  Intimate Partner Violence:   . Fear of Current or Ex-Partner: Not on file  . Emotionally Abused: Not on file  . Physically Abused: Not on file  . Sexually Abused: Not on file    FAMILY HISTORY:  Family History  Problem Relation Age of Onset  . Cancer Mother        lung  . Cancer Father        prostate  . Colon cancer Neg Hx   . Diabetes Neg Hx     CURRENT MEDICATIONS:  Outpatient Encounter Medications as of 08/22/2019  Medication Sig  . apixaban (ELIQUIS) 5 MG TABS tablet Take 1 tablet (5 mg total)  by mouth 2 (two) times daily.  . furosemide (LASIX) 20 MG tablet Take 20 mg by mouth daily as needed for fluid or edema.   Marland Kitchen glimepiride (AMARYL) 1 MG tablet Take 1 tablet (1 mg total) by mouth 2 (two) times daily with a meal. take 1 tablet by mouth twice daily WITH BREAKFAST  . metoprolol tartrate (LOPRESSOR) 25 MG tablet Take 1 tablet (25 mg total) by mouth 2 (two) times daily.  . pantoprazole (PROTONIX) 40 MG tablet Take 1 tablet (40 mg total) by mouth daily.  . predniSONE (DELTASONE) 20 MG tablet Take 5 tablets (100 mg total) by mouth daily with breakfast.  . riTUXimab in sodium chloride 0.9 % 250 mL Inject into the vein once a week. Weekly x 4 weeks  . rosuvastatin (CRESTOR) 40 MG tablet Take 40 mg by mouth daily.  . clotrimazole-betamethasone (LOTRISONE) cream Apply 1 application topically 2 (two) times daily as needed (leg infection).    No facility-administered encounter medications on file as of 08/22/2019.    ALLERGIES:  Allergies  Allergen Reactions  . Penicillins Rash    Has patient had a PCN reaction causing immediate rash, facial/tongue/throat swelling, SOB or lightheadedness with hypotension: Unknown Has patient had a PCN reaction causing severe rash involving mucus membranes or skin necrosis: Unknown Has patient had a PCN reaction that required hospitalization: Unknown Has patient had a PCN reaction occurring within the last 10 years: No If all of the above answers are "NO", then may proceed with Cephalosporin use.      PHYSICAL EXAM:  ECOG Performance status: 1  Vitals:   08/22/19 0835  BP: (!) 170/85  Pulse: 72  Resp: (!) 22  Temp: (!) 97.1 F (36.2 C)  SpO2: 100%   Filed Weights   08/22/19 0835  Weight: 281 lb (127.5 kg)    Physical Exam Constitutional:      Appearance: Normal appearance. He is normal weight.  Cardiovascular:     Rate and Rhythm: Normal rate and regular rhythm.     Heart sounds: Normal heart sounds.  Pulmonary:     Effort:  Pulmonary effort is normal.     Breath sounds: Normal breath sounds.  Abdominal:     General: Bowel sounds are normal.     Palpations: Abdomen is soft.  Musculoskeletal:        General: Normal range of  motion.  Skin:    General: Skin is warm.  Neurological:     Mental Status: He is alert and oriented to person, place, and time. Mental status is at baseline.  Psychiatric:        Mood and Affect: Mood normal.        Behavior: Behavior normal.        Thought Content: Thought content normal.        Judgment: Judgment normal.      LABORATORY DATA:  I have reviewed the labs as listed.  CBC    Component Value Date/Time   WBC 8.7 08/22/2019 0815   RBC 3.21 (L) 08/22/2019 0815   HGB 11.7 (L) 08/22/2019 0815   HCT 36.1 (L) 08/22/2019 0815   PLT 135 (L) 08/22/2019 0815   MCV 112.5 (H) 08/22/2019 0815   MCH 36.4 (H) 08/22/2019 0815   MCHC 32.4 08/22/2019 0815   RDW 16.7 (H) 08/22/2019 0815   LYMPHSABS 0.9 08/22/2019 0815   MONOABS 0.7 08/22/2019 0815   EOSABS 0.0 08/22/2019 0815   BASOSABS 0.0 08/22/2019 0815   CMP Latest Ref Rng & Units 08/22/2019 08/15/2019 08/10/2019  Glucose 70 - 99 mg/dL 111(H) 81 82  BUN 8 - 23 mg/dL _0 Creatinine 0.61 - 1.24 mg/dL 0.91 1.13 1.01  Sodium 135 - 145 mmol/L 141 140 142  Potassium 3.5 - 5.1 mmol/L 4.0 3.9 3.8  Chloride 98 - 111 mmol/L 106 105 106  CO2 22 - 32 mmol/L _1 Calcium 8.9 - 10.3 mg/dL 9.0 8.8(L) 9.1  Total Protein 6.5 - 8.1 g/dL 6.2(L) 6.3(L) 6.9  Total Bilirubin 0.3 - 1.2 mg/dL 1.0 2.0(H) 3.0(H)  Alkaline Phos 38 - 126 U/L 42 43 49  AST 15 - 41 U/L _2 ALT 0 - 44 U/L 36 23 19     I personally performed a face-to-face visit.  All questions were answered to patient's stated satisfaction. Encouraged patient to call with any new concerns or questions before his next visit to the cancer center and we can certain see him sooner, if needed.     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  Autoimmune hemolytic anemia: -Admission to the hospital on 08/03/2019 with hemoglobin 6.3. Direct Coombs test IgG was positive. LDH and reticulocyte count was elevated. -Prednisone 120 mg daily started on 08/05/2019. -He reported tolerating prednisone without any major problems. -He was started on rituximab x4 for a deep and prolonged response. -Hepatitis panel was negative -Prednisone was cut back to 100 mg daily when he started the rituximab. -He is here today 08/22/2019 for his second dose of rituximab. -Labs done on 08/22/2019 showed his hemoglobin 11.7.  I will cut back his prednisone to 80 mg daily. -We will see him back next week with labs and treatment.  2.  Diffuse large B-cell lymphoma: -Originally diagnosed in 2006, treated with R-CHOP (03/11/2005-08/04/2005) with complete response on PET scan. -Relapse of lymphoma on 08/31/2005.  He was given ICE chemotherapy followed by stem cell transplant on 01/15/2006. -He received rituximab weekly x4 followed by every 6 months for 2 years from 04/05/2006-12/05/2007. -CT CAP on 08/04/2019 showed increase in size of the pretracheal lymph node compared to prior scans.  Rest of the lymphadenopathy was stable. -PET scan on 08/14/2019 did not show any evidence of recurrent lymphoma.      Orders placed this encounter:  Orders Placed This Encounter  Procedures  . Lactate dehydrogenase  .  CBC with Differential/Platelet  . Comprehensive metabolic panel  . Reticulocytes      Francene Finders, FNP-C [employer] [phone]

## 2019-08-22 NOTE — Assessment & Plan Note (Addendum)
1. Autoimmune hemolytic anemia: -Admission to the hospital on 08/03/2019 with hemoglobin 6.3. Direct Coombs test IgG was positive. LDH and reticulocyte count was elevated. -Prednisone 120 mg daily started on 08/05/2019. -He reported tolerating prednisone without any major problems. -He was started on rituximab x4 for a deep and prolonged response. -Hepatitis panel was negative -Prednisone was cut back to 100 mg daily when he started the rituximab. -He is here today 08/22/2019 for his second dose of rituximab. -Labs done on 08/22/2019 showed his hemoglobin 11.7.  I will cut back his prednisone to 80 mg daily. -We will see him back next week with labs and treatment.  2.  Diffuse large B-cell lymphoma: -Originally diagnosed in 2006, treated with R-CHOP (03/11/2005-08/04/2005) with complete response on PET scan. -Relapse of lymphoma on 08/31/2005.  He was given ICE chemotherapy followed by stem cell transplant on 01/15/2006. -He received rituximab weekly x4 followed by every 6 months for 2 years from 04/05/2006-12/05/2007. -CT CAP on 08/04/2019 showed increase in size of the pretracheal lymph node compared to prior scans.  Rest of the lymphadenopathy was stable. -PET scan on 08/14/2019 did not show any evidence of recurrent lymphoma.

## 2019-08-22 NOTE — Patient Instructions (Signed)
[facility] Discharge Instructions for Patients Receiving Chemotherapy  Today you received the following chemotherapy agents   To help prevent nausea and vomiting after your treatment, we encourage you to take your nausea medication   If you develop nausea and vomiting that is not controlled by your nausea medication, call the clinic.   BELOW ARE SYMPTOMS THAT SHOULD BE REPORTED IMMEDIATELY:  *FEVER GREATER THAN 100.5 F  *CHILLS WITH OR WITHOUT FEVER  NAUSEA AND VOMITING THAT IS NOT CONTROLLED WITH YOUR NAUSEA MEDICATION  *UNUSUAL SHORTNESS OF BREATH  *UNUSUAL BRUISING OR BLEEDING  TENDERNESS IN MOUTH AND THROAT WITH OR WITHOUT PRESENCE OF ULCERS  *URINARY PROBLEMS  *BOWEL PROBLEMS  UNUSUAL RASH Items with * indicate a potential emergency and should be followed up as soon as possible.  Feel free to call the clinic should you have any questions or concerns. The clinic phone number is [phone].  Please show the CHEMO ALERT CARD at check-in to the Emergency Department and triage nurse.   

## 2019-08-22 NOTE — Patient Instructions (Signed)
[place] at [facility] Discharge Instructions  Follow up in 1 week with labs and treatment    Thank you for choosing [place] at [facility] to provide your oncology and hematology care.  To afford each patient quality time with our provider, please arrive at least 15 minutes before your scheduled appointment time.   If you have a lab appointment with the [facility] please come in thru the Main Entrance and check in at the main information desk.  You need to re-schedule your appointment should you arrive 10 or more minutes late.  We strive to give you quality time with our providers, and arriving late affects you and other patients whose appointments are after yours.  Also, if you no show three or more times for appointments you may be dismissed from the clinic at the providers discretion.     Again, thank you for choosing [facility].  Our hope is that these requests will decrease the amount of time that you wait before being seen by our physicians.       _____________________________________________________________  Should you have questions after your visit to [facility], please contact our office at [phone] between the hours of 8:00 a.m. and 4:30 p.m.  Voicemails left after 4:00 p.m. will not be returned until the following business day.  For prescription refill requests, have your pharmacy contact our office and allow 72 hours.    Due to Covid, you will need to wear a mask upon entering the hospital. If you do not have a mask, a mask will be given to you at the Main Entrance upon arrival. For doctor visits, patients may have 1 support person with them. For treatment visits, patients can not have anyone with them due to social distancing guidelines and our immunocompromised population.

## 2019-08-22 NOTE — Progress Notes (Signed)
Labs reviewed today with Francene Finders NP. Proceed with treatment as planned.  Treatment given per orders. Patient tolerated it well without problems. Vitals stable and discharged home from clinic via wheelchair. Follow up as scheduled.

## 2019-08-29 ENCOUNTER — Encounter (HOSPITAL_COMMUNITY): Payer: Self-pay | Admitting: Hematology

## 2019-08-29 ENCOUNTER — Inpatient Hospital Stay (HOSPITAL_COMMUNITY): Payer: Medicare Other

## 2019-08-29 ENCOUNTER — Inpatient Hospital Stay (HOSPITAL_COMMUNITY): Payer: Medicare Other | Attending: Hematology

## 2019-08-29 ENCOUNTER — Inpatient Hospital Stay (HOSPITAL_COMMUNITY): Payer: Medicare Other | Admitting: Hematology

## 2019-08-29 ENCOUNTER — Other Ambulatory Visit: Payer: Self-pay

## 2019-08-29 VITALS — BP 158/74 | HR 66 | Temp 96.8°F | Resp 18

## 2019-08-29 DIAGNOSIS — I110 Hypertensive heart disease with heart failure: Secondary | ICD-10-CM | POA: Insufficient documentation

## 2019-08-29 DIAGNOSIS — I5032 Chronic diastolic (congestive) heart failure: Secondary | ICD-10-CM | POA: Insufficient documentation

## 2019-08-29 DIAGNOSIS — D5919 Other autoimmune hemolytic anemia: Secondary | ICD-10-CM | POA: Diagnosis not present

## 2019-08-29 DIAGNOSIS — M7989 Other specified soft tissue disorders: Secondary | ICD-10-CM | POA: Insufficient documentation

## 2019-08-29 DIAGNOSIS — Z5112 Encounter for antineoplastic immunotherapy: Secondary | ICD-10-CM | POA: Diagnosis not present

## 2019-08-29 DIAGNOSIS — D591 Autoimmune hemolytic anemia, unspecified: Secondary | ICD-10-CM | POA: Diagnosis not present

## 2019-08-29 DIAGNOSIS — Z8572 Personal history of non-Hodgkin lymphomas: Secondary | ICD-10-CM | POA: Diagnosis not present

## 2019-08-29 DIAGNOSIS — E119 Type 2 diabetes mellitus without complications: Secondary | ICD-10-CM | POA: Insufficient documentation

## 2019-08-29 LAB — RETICULOCYTES
Immature Retic Fract: 16.9 % — ABNORMAL HIGH (ref 2.3–15.9)
RBC.: 3.55 MIL/uL — ABNORMAL LOW (ref 4.22–5.81)
Retic Count, Absolute: 85.2 10*3/uL (ref 19.0–186.0)
Retic Ct Pct: 2.4 % (ref 0.4–3.1)

## 2019-08-29 LAB — COMPREHENSIVE METABOLIC PANEL
ALT: 46 U/L — ABNORMAL HIGH (ref 0–44)
AST: 15 U/L (ref 15–41)
Albumin: 3.6 g/dL (ref 3.5–5.0)
Alkaline Phosphatase: 47 U/L (ref 38–126)
Anion gap: 8 (ref 5–15)
BUN: 18 mg/dL (ref 8–23)
CO2: 26 mmol/L (ref 22–32)
Calcium: 8.8 mg/dL — ABNORMAL LOW (ref 8.9–10.3)
Chloride: 105 mmol/L (ref 98–111)
Creatinine, Ser: 0.92 mg/dL (ref 0.61–1.24)
GFR calc Af Amer: >60 mL/min (ref >60)
GFR calc non Af Amer: >60 mL/min (ref >60)
Glucose, Bld: 129 mg/dL — ABNORMAL HIGH (ref 70–99)
Potassium: 4.1 mmol/L (ref 3.5–5.1)
Sodium: 139 mmol/L (ref 135–145)
Total Bilirubin: 0.9 mg/dL (ref 0.3–1.2)
Total Protein: 6.3 g/dL — ABNORMAL LOW (ref 6.5–8.1)

## 2019-08-29 LAB — CBC WITH DIFFERENTIAL/PLATELET
Abs Immature Granulocytes: 0.01 10*3/uL (ref 0.00–0.07)
Basophils Absolute: 0 10*3/uL (ref 0.0–0.1)
Basophils Relative: 0 %
Eosinophils Absolute: 0 10*3/uL (ref 0.0–0.5)
Eosinophils Relative: 0 %
HCT: 38.7 % — ABNORMAL LOW (ref 39.0–52.0)
Hemoglobin: 12.5 g/dL — ABNORMAL LOW (ref 13.0–17.0)
Immature Granulocytes: 0 %
Lymphocytes Relative: 8 %
Lymphs Abs: 0.6 10*3/uL — ABNORMAL LOW (ref 0.7–4.0)
MCH: 35.8 pg — ABNORMAL HIGH (ref 26.0–34.0)
MCHC: 32.3 g/dL (ref 30.0–36.0)
MCV: 110.9 fL — ABNORMAL HIGH (ref 80.0–100.0)
Monocytes Absolute: 0.6 10*3/uL (ref 0.1–1.0)
Monocytes Relative: 9 %
Neutro Abs: 6.1 10*3/uL (ref 1.7–7.7)
Neutrophils Relative %: 83 %
Platelets: 138 10*3/uL — ABNORMAL LOW (ref 150–400)
RBC: 3.49 MIL/uL — ABNORMAL LOW (ref 4.22–5.81)
RDW: 15.2 % (ref 11.5–15.5)
WBC: 7.3 10*3/uL (ref 4.0–10.5)
nRBC: 0 % (ref 0.0–0.2)

## 2019-08-29 LAB — LACTATE DEHYDROGENASE: LDH: 168 U/L (ref 98–192)

## 2019-08-29 MED ORDER — DIPHENHYDRAMINE HCL 50 MG/ML IJ SOLN
50.0000 mg | Freq: Once | INTRAMUSCULAR | Status: AC
  Administered 2019-08-29: 50 mg via INTRAVENOUS
  Filled 2019-08-29: qty 1

## 2019-08-29 MED ORDER — FAMOTIDINE IN NACL 20-0.9 MG/50ML-% IV SOLN
20.0000 mg | Freq: Once | INTRAVENOUS | Status: AC
  Administered 2019-08-29: 20 mg via INTRAVENOUS
  Filled 2019-08-29: qty 50

## 2019-08-29 MED ORDER — ACETAMINOPHEN 325 MG PO TABS
650.0000 mg | ORAL_TABLET | Freq: Once | ORAL | Status: AC
  Administered 2019-08-29: 650 mg via ORAL
  Filled 2019-08-29: qty 2

## 2019-08-29 MED ORDER — SODIUM CHLORIDE 0.9 % IV SOLN
375.0000 mg/m2 | Freq: Once | INTRAVENOUS | Status: AC
  Administered 2019-08-29: 900 mg via INTRAVENOUS
  Filled 2019-08-29: qty 50

## 2019-08-29 MED ORDER — SODIUM CHLORIDE 0.9 % IV SOLN: Freq: Once | INTRAVENOUS | Status: AC

## 2019-08-29 NOTE — Patient Instructions (Signed)
[place] at [facility] Discharge Instructions  You were seen today by Dr. Delton Coombes. He went over your recent lab results. Start taking 3 tablets of prednisone daily. Start taking your Lasix every other day. He will see you back in 1 week for labs, treatment and follow up.   Thank you for choosing [place] at [facility] to provide your oncology and hematology care.  To afford each patient quality time with our provider, please arrive at least 15 minutes before your scheduled appointment time.   If you have a lab appointment with the [facility] please come in thru the  Main Entrance and check in at the main information desk  You need to re-schedule your appointment should you arrive 10 or more minutes late.  We strive to give you quality time with our providers, and arriving late affects you and other patients whose appointments are after yours.  Also, if you no show three or more times for appointments you may be dismissed from the clinic at the providers discretion.     Again, thank you for choosing [facility].  Our hope is that these requests will decrease the amount of time that you wait before being seen by our physicians.       _____________________________________________________________  Should you have questions after your visit to [facility], please contact our office at [phone] between the hours of 8:00 a.m. and 4:30 p.m.  Voicemails left after 4:00 p.m. will not be returned until the following business day.  For prescription refill requests, have your pharmacy contact our office and allow 72 hours.    Cancer Center Support Programs:   > Cancer Support Group  2nd Tuesday of the month 1pm-2pm, Journey Room

## 2019-08-29 NOTE — Progress Notes (Signed)
Labs reviewed today with MD. Proceed as planned.  Treatment given per orders. Patient tolerated it well without problems. Vitals stable and discharged home from clinic ambulatory. Follow up as scheduled.

## 2019-08-29 NOTE — Assessment & Plan Note (Addendum)
1.  Autoimmune hemolytic anemia: -Admission to the hospital on 08/03/2019 with hemoglobin of 6.3.  Direct Coombs test IgG was positive.  LDH and reticulocyte count was elevated. -Prednisone 120 mg daily started on 08/05/2019. -Weekly rituximab x2 from 08/15/2019 through 08/22/2019. -Prednisone dose tapered to 80 mg on 08/22/2019. -I reviewed his blood work.  Hemoglobin improved about 12.  LDH and reticulocyte count was normal.  I will cut back prednisone to 60 mg daily. -We will proceed with week 3 of rituximab today.  He will be seen back in 1 week for follow-up.  2.  Lower extremity swelling: -He reported losing from the right lateral leg due to increased swelling. -He was told to take Lasix 20 mg every other day.  3.  Diffuse large B-cell lymphoma: -Originally diagnosed in 2006, treated with R-CHOP (03/11/2005 -08/04/2005) with complete response on PET scan. -Relapse of lymphoma on 08/31/2005.  He was given ICE chemotherapy followed by stem cell transplant on 01/15/2006. -He received rituximab weekly x4 followed by every 6 months for 2 years from 04/05/2006 through 12/05/2007. -CT CAP on 08/04/2019 showed increase in size of the pretracheal lymph node compared to prior scans.  Rest of the lymphadenopathy is stable. -I have independently reviewed results of PET scan from 08/14/2019 which did not show any evidence of recurrence of lymphoma.

## 2019-08-29 NOTE — Progress Notes (Signed)
[place], [place] [address]   CLINIC:  Medical Oncology/Hematology  PCP:  [facility], [place] [address] [phone]   REASON FOR VISIT:  Follow-up for large B-cell lymphoma and newly diagnosed autoimmune hemolytic anemia.  CURRENT THERAPY: Weekly rituximab and prednisone.  BRIEF ONCOLOGIC HISTORY:  Oncology History Overview Note  S/P chemotherapy following stabilization of the spine.  Initial biopsy was on 07/13/2005 on his bone marrow.  Thoracic vertebra was biopsied on 02/04/2005 and mediastinum on 02/03/2005.  S/P R-CHOP and achieved an incomplete response, then underwent ICE chemotherapy pre-transplant and total body radiation with autologous stem cell transplant on 01/15/2006 and maintained on Rituxan for 2 years.  Thus far without recurrence.   DLBCL (diffuse large B cell lymphoma) ([facility])  01/27/2005 Imaging   PET- diffuse activity  involving the L cervical area, extensive activity in the mediastinumn particularly in the R paratracheal area and to a lesser degree in the anterior mediastinum.  Lg area in porta caval nodes in upper abd and diffuse bone activity   02/03/2005 Pathology Results   Mediastinal lymph node demonstrating diffuse large cell lymphoma, B-type.  Vertebral bone biopsy demonstrating diffuse large b-cell lymphoma   03/11/2005 - 08/04/2005 Chemotherapy   R-CHOP x 8 cycles   05/04/2005 Imaging   PET- near resolution of previously identified uptake.   07/13/2005 Bone Marrow Biopsy   Bone marrow aspiration and biopsy is negative for lymphoma   08/31/2005 Imaging   PET- further response to therapy with findings consistent with residual disease.  Hypermetabolic activity identified within the anterior mediastinal lymph node (smaller but still pathologic).  Borderline hypermetabolic small right pleural effusion   10/20/2005 - 11/13/2005 Chemotherapy   R-ICE x 2 cycles at [facility]   01/08/2006 -  01/09/2006 Chemotherapy   Cyclophosphamide 600 mg daily x 2   01/10/2006 - 01/14/2006 Radiation Therapy   Total body radiation twice daily   01/15/2006 Bone Marrow Transplant   Stem cell transplant at [facility]   04/05/2006 - 12/05/2007 Chemotherapy   Rituxan weekly x 4 every 6 months x 2 years      CANCER STAGING: Cancer Staging DLBCL (diffuse large B cell lymphoma) (HCC) Staging form: Lymphoid Neoplasms, AJCC 6th Edition - Clinical: Stage IV - Signed by Baird Cancer, PA on 04/29/2011    INTERVAL HISTORY:  [name] 69 y.o. male seen for follow-up of autoimmune hemolytic anemia and history of large B-cell lymphoma.  He complained of increased swelling of the legs with oozing of the right lateral leg.  Appetite and energy levels are 75%.  No fevers or night sweats reported.    REVIEW OF SYSTEMS:  Review of Systems  Cardiovascular: Positive for leg swelling.  All other systems reviewed and are negative.    PAST MEDICAL/SURGICAL HISTORY:  Past Medical History:  Diagnosis Date  . Asthma    as child  . Diabetes mellitus without complication ([facility])   . DLBCL (diffuse large B cell lymphoma) ([facility]) 02/28/2009  . Edema   . Heart failure, diastolic, chronic ([facility])    patient denies  . Hyperlipidemia, mixed   . Hypertension   . IDA (iron deficiency anemia)   . Large cell lymphoma ([facility]) 01/2005   autologous stem cell transplant 12/2005  . Obesity, morbid (more than 100 lbs over ideal weight or BMI > 40) (HCC)   . Venous insufficiency 04/29/2011   Past Surgical History:  Procedure Laterality Date  . BACK SURGERY  2006   T6 vertebrae removed/titanium placed  . CATARACT EXTRACTION W/PHACO Right 03/31/2019   Procedure: CATARACT EXTRACTION PHACO AND INTRAOCULAR LENS PLACEMENT (IOC);  Surgeon: Baruch Goldmann, MD;  Location: [facility];  Service: Ophthalmology;  Laterality: Right;  CDE: 3.21  . CATARACT EXTRACTION W/PHACO Left 04/14/2019   Procedure: CATARACT EXTRACTION PHACO AND INTRAOCULAR LENS  PLACEMENT (IOC);  Surgeon: Baruch Goldmann, MD;  Location: [facility];  Service: Ophthalmology;  Laterality: Left;  left - pt knows to arrive at 7:45, CDE: 3.55  . COLONOSCOPY  11/24/2004   Polyps in the left colon ablated/removed as described above.  Two submucosal lesions consistent with lipomas as described above not  manipulated./ Normal rectum  . COLONOSCOPY  06/20/2012   MULTIPLE RECTAL AND COLONIC POLYPS  . COLONOSCOPY N/A 03/08/2015   RMR: Capacious, redundant colon. Multiple colonic and rectosigmoid polyps removed. ablated as described above. colonic lipoma abnormal appearing terminal ileum likely a variant of normal). Howeverwith history  biopsies obtained.   . ESOPHAGOGASTRODUODENOSCOPY N/A 03/08/2015   RMR: Hiatal hernia Polypoid gastric mucosa with multiple gastric polyps. largest polyp removed via snare polypectomgy hemostasis clip placed at base. Status post gastric biopsy. Status post video capsule placement.   Marland Kitchen GIVENS CAPSULE STUDY N/A 03/08/2015   Procedure: GIVENS CAPSULE STUDY;  Surgeon: Daneil Dolin, MD;  Location: [facility];  Service: Endoscopy;  Laterality: N/A;  . LIMBAL STEM CELL TRANSPLANT    . LUNG BIOPSY  6/06  . MULTIPLE TOOTH EXTRACTIONS  04/2005  . port a cath placement    . PORT-A-CATH REMOVAL  09/08/2012   Procedure: REMOVAL PORT-A-CATH;  Surgeon: Melrose Nakayama, MD;  Location: [facility];  Service: Thoracic;  Laterality: N/A;     SOCIAL HISTORY:  Social History   Socioeconomic History  . Marital status: Married    Spouse name: Not on file  . Number of children: 2  . Years of education: Not on file  . Highest education level: Not on file  Occupational History  . Occupation: disability due to back    Employer: UNEMPLOYED  Tobacco Use  . Smoking status: Former Smoker    Packs/day: 0.50    Years: 15.00    Pack years: 7.50    Quit date: 11/11/2002    Years since quitting: 16.8  . Smokeless tobacco: Never Used  Substance and Sexual Activity  . Alcohol  use: No    Alcohol/week: 0.0 standard drinks  . Drug use: No  . Sexual activity: Yes    Birth control/protection: None  Other Topics Concern  . Not on file  Social History Narrative   Married   No regular exercise   Social Determinants of Health   Financial Resource Strain:   . Difficulty of Paying Living Expenses: Not on file  Food Insecurity:   . Worried About Charity fundraiser in the Last Year: Not on file  . Ran Out of Food in the Last Year: Not on file  Transportation Needs:   . Lack of Transportation (Medical): Not on file  . Lack of Transportation (Non-Medical): Not on file  Physical Activity:   . Days of Exercise per Week: Not on file  . Minutes of Exercise per Session: Not on file  Stress:   . Feeling of Stress : Not on file  Social Connections:   . Frequency of Communication with Friends and Family: Not on file  . Frequency of Social Gatherings with Friends and Family: Not on file  . Attends Religious  Services: Not on file  . Active Member of Clubs or Organizations: Not on file  . Attends Archivist Meetings: Not on file  . Marital Status: Not on file  Intimate Partner Violence:   . Fear of Current or Ex-Partner: Not on file  . Emotionally Abused: Not on file  . Physically Abused: Not on file  . Sexually Abused: Not on file    FAMILY HISTORY:  Family History  Problem Relation Age of Onset  . Cancer Mother        lung  . Cancer Father        prostate  . Colon cancer Neg Hx   . Diabetes Neg Hx     CURRENT MEDICATIONS:  Outpatient Encounter Medications as of 08/29/2019  Medication Sig  . apixaban (ELIQUIS) 5 MG TABS tablet Take 1 tablet (5 mg total) by mouth 2 (two) times daily.  Marland Kitchen glimepiride (AMARYL) 1 MG tablet Take 1 tablet (1 mg total) by mouth 2 (two) times daily with a meal. take 1 tablet by mouth twice daily WITH BREAKFAST  . metoprolol tartrate (LOPRESSOR) 25 MG tablet Take 1 tablet (25 mg total) by mouth 2 (two) times daily.  .  pantoprazole (PROTONIX) 40 MG tablet Take 1 tablet (40 mg total) by mouth daily.  . predniSONE (DELTASONE) 20 MG tablet Take 5 tablets (100 mg total) by mouth daily with breakfast.  . riTUXimab in sodium chloride 0.9 % 250 mL Inject into the vein once a week. Weekly x 4 weeks  . rosuvastatin (CRESTOR) 40 MG tablet Take 40 mg by mouth daily.  . clotrimazole-betamethasone (LOTRISONE) cream Apply 1 application topically 2 (two) times daily as needed (leg infection).   . furosemide (LASIX) 20 MG tablet Take 20 mg by mouth daily as needed for fluid or edema.    No facility-administered encounter medications on file as of 08/29/2019.    ALLERGIES:  Allergies  Allergen Reactions  . Penicillins Rash    Has patient had a PCN reaction causing immediate rash, facial/tongue/throat swelling, SOB or lightheadedness with hypotension: Unknown Has patient had a PCN reaction causing severe rash involving mucus membranes or skin necrosis: Unknown Has patient had a PCN reaction that required hospitalization: Unknown Has patient had a PCN reaction occurring within the last 10 years: No If all of the above answers are "NO", then may proceed with Cephalosporin use.      PHYSICAL EXAM:  ECOG Performance status: 1  Vitals:   08/29/19 0819 08/29/19 0825  BP:  (!) 153/79  Pulse:  72  Resp:  20  Temp: (!) 97.3 F (36.3 C) (!) 97.3 F (36.3 C)  SpO2:  100%   Filed Weights   08/29/19 0819 08/29/19 0825  Weight: 278 lb (126.1 kg) 278 lb (126.1 kg)    Physical Exam Vitals reviewed.  Constitutional:      Appearance: Normal appearance.  Cardiovascular:     Rate and Rhythm: Normal rate and regular rhythm.     Heart sounds: Normal heart sounds.  Pulmonary:     Effort: Pulmonary effort is normal.     Breath sounds: Normal breath sounds.  Abdominal:     General: There is no distension.     Palpations: Abdomen is soft. There is no mass.  Lymphadenopathy:     Cervical: No cervical adenopathy.  Skin:     General: Skin is warm.  Neurological:     General: No focal deficit present.     Mental Status: He is  alert and oriented to person, place, and time.  Psychiatric:        Mood and Affect: Mood normal.        Behavior: Behavior normal.      LABORATORY DATA:  I have reviewed the labs as listed.  CBC    Component Value Date/Time   WBC 7.3 08/29/2019 0810   RBC 3.55 (L) 08/29/2019 0811   RBC 3.49 (L) 08/29/2019 0810   HGB 12.5 (L) 08/29/2019 0810   HCT 38.7 (L) 08/29/2019 0810   PLT 138 (L) 08/29/2019 0810   MCV 110.9 (H) 08/29/2019 0810   MCH 35.8 (H) 08/29/2019 0810   MCHC 32.3 08/29/2019 0810   RDW 15.2 08/29/2019 0810   LYMPHSABS 0.6 (L) 08/29/2019 0810   MONOABS 0.6 08/29/2019 0810   EOSABS 0.0 08/29/2019 0810   BASOSABS 0.0 08/29/2019 0810   CMP Latest Ref Rng & Units 08/29/2019 08/22/2019 08/15/2019  Glucose 70 - 99 mg/dL 129(H) 111(H) 81  BUN 8 - 23 mg/dL 18 18 18   Creatinine 0.61 - 1.24 mg/dL 0.92 0.91 1.13  Sodium 135 - 145 mmol/L 139 141 140  Potassium 3.5 - 5.1 mmol/L 4.1 4.0 3.9  Chloride 98 - 111 mmol/L 105 106 105  CO2 22 - 32 mmol/L 26 25 25   Calcium 8.9 - 10.3 mg/dL 8.8(L) 9.0 8.8(L)  Total Protein 6.5 - 8.1 g/dL 6.3(L) 6.2(L) 6.3(L)  Total Bilirubin 0.3 - 1.2 mg/dL 0.9 1.0 2.0(H)  Alkaline Phos 38 - 126 U/L 47 42 43  AST 15 - 41 U/L 15 15 17   ALT 0 - 44 U/L 46(H) 36 23       DIAGNOSTIC IMAGING:  I have independently reviewed the scans and discussed with the patient.   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  Autoimmune hemolytic anemia: -Admission to the hospital on 08/03/2019 with hemoglobin of 6.3.  Direct Coombs test IgG was positive.  LDH and reticulocyte count was elevated. -Prednisone 120 mg daily started on 08/05/2019. -Weekly rituximab x2 from 08/15/2019 through 08/22/2019. -Prednisone dose tapered to 80 mg on 08/22/2019. -I reviewed his blood work.  Hemoglobin improved about 12.  LDH and reticulocyte count was  normal.  I will cut back prednisone to 60 mg daily. -We will proceed with week 3 of rituximab today.  He will be seen back in 1 week for follow-up.  2.  Lower extremity swelling: -He reported losing from the right lateral leg due to increased swelling. -He was told to take Lasix 20 mg every other day.  3.  Diffuse large B-cell lymphoma: -Originally diagnosed in 2006, treated with R-CHOP (03/11/2005 -08/04/2005) with complete response on PET scan. -Relapse of lymphoma on 08/31/2005.  He was given ICE chemotherapy followed by stem cell transplant on 01/15/2006. -He received rituximab weekly x4 followed by every 6 months for 2 years from 04/05/2006 through 12/05/2007. -CT CAP on 08/04/2019 showed increase in size of the pretracheal lymph node compared to prior scans.  Rest of the lymphadenopathy is stable. -I have independently reviewed results of PET scan from 08/14/2019 which did not show any evidence of recurrence of lymphoma.  Total time spent is 30 minutes with more than 50% of the time spent face-to-face discussing lab results and treatment plan.    Orders placed this encounter:  No orders of the defined types were placed in this encounter.     Derek Jack, MD [place] [phone]

## 2019-08-31 ENCOUNTER — Other Ambulatory Visit: Payer: Self-pay

## 2019-08-31 NOTE — Patient Outreach (Signed)
[facility]) Care Management  08/31/2019  SHIRLEY BOLLE [birth year] [id number]   Medication Adherence call to Mr. [name] [facility] Identifiers Verify spoke with patient he is past due on Rosuvastatin 40 mg.patient explain he takes 1 tablet daily,patient has enough for 6 more days,patient will order in a couple of days along his other medications. [name] is showing past due under [place].  [place] Management Direct Dial [phone]  Fax [phone] [email or web address]

## 2019-09-04 ENCOUNTER — Other Ambulatory Visit (HOSPITAL_COMMUNITY): Payer: Self-pay | Admitting: *Deleted

## 2019-09-04 DIAGNOSIS — D696 Thrombocytopenia, unspecified: Secondary | ICD-10-CM

## 2019-09-04 DIAGNOSIS — D5919 Other autoimmune hemolytic anemia: Secondary | ICD-10-CM

## 2019-09-05 ENCOUNTER — Encounter (HOSPITAL_COMMUNITY): Payer: Self-pay | Admitting: Hematology

## 2019-09-05 ENCOUNTER — Inpatient Hospital Stay (HOSPITAL_COMMUNITY): Payer: Medicare Other

## 2019-09-05 ENCOUNTER — Other Ambulatory Visit: Payer: Self-pay

## 2019-09-05 ENCOUNTER — Other Ambulatory Visit (HOSPITAL_COMMUNITY): Payer: Self-pay | Admitting: Hematology

## 2019-09-05 ENCOUNTER — Inpatient Hospital Stay (HOSPITAL_BASED_OUTPATIENT_CLINIC_OR_DEPARTMENT_OTHER): Payer: Medicare Other | Admitting: Hematology

## 2019-09-05 VITALS — BP 154/73 | HR 65 | Temp 97.7°F | Resp 18

## 2019-09-05 VITALS — BP 159/81 | HR 73 | Temp 97.3°F | Resp 18 | Wt 270.0 lb

## 2019-09-05 DIAGNOSIS — E119 Type 2 diabetes mellitus without complications: Secondary | ICD-10-CM | POA: Diagnosis not present

## 2019-09-05 DIAGNOSIS — D591 Autoimmune hemolytic anemia, unspecified: Secondary | ICD-10-CM | POA: Diagnosis not present

## 2019-09-05 DIAGNOSIS — Z8572 Personal history of non-Hodgkin lymphomas: Secondary | ICD-10-CM | POA: Diagnosis not present

## 2019-09-05 DIAGNOSIS — Z5112 Encounter for antineoplastic immunotherapy: Secondary | ICD-10-CM | POA: Diagnosis not present

## 2019-09-05 DIAGNOSIS — D5919 Other autoimmune hemolytic anemia: Secondary | ICD-10-CM

## 2019-09-05 DIAGNOSIS — D696 Thrombocytopenia, unspecified: Secondary | ICD-10-CM

## 2019-09-05 DIAGNOSIS — I110 Hypertensive heart disease with heart failure: Secondary | ICD-10-CM | POA: Diagnosis not present

## 2019-09-05 DIAGNOSIS — I5032 Chronic diastolic (congestive) heart failure: Secondary | ICD-10-CM | POA: Diagnosis not present

## 2019-09-05 DIAGNOSIS — M7989 Other specified soft tissue disorders: Secondary | ICD-10-CM | POA: Diagnosis not present

## 2019-09-05 LAB — CBC WITH DIFFERENTIAL/PLATELET
Abs Immature Granulocytes: 0.04 10*3/uL (ref 0.00–0.07)
Basophils Absolute: 0 10*3/uL (ref 0.0–0.1)
Basophils Relative: 0 %
Eosinophils Absolute: 0 10*3/uL (ref 0.0–0.5)
Eosinophils Relative: 0 %
HCT: 40.4 % (ref 39.0–52.0)
Hemoglobin: 13.2 g/dL (ref 13.0–17.0)
Immature Granulocytes: 1 %
Lymphocytes Relative: 6 %
Lymphs Abs: 0.4 10*3/uL — ABNORMAL LOW (ref 0.7–4.0)
MCH: 35.3 pg — ABNORMAL HIGH (ref 26.0–34.0)
MCHC: 32.7 g/dL (ref 30.0–36.0)
MCV: 108 fL — ABNORMAL HIGH (ref 80.0–100.0)
Monocytes Absolute: 0.5 10*3/uL (ref 0.1–1.0)
Monocytes Relative: 7 %
Neutro Abs: 5.4 10*3/uL (ref 1.7–7.7)
Neutrophils Relative %: 86 %
Platelets: 160 10*3/uL (ref 150–400)
RBC: 3.74 MIL/uL — ABNORMAL LOW (ref 4.22–5.81)
RDW: 14.4 % (ref 11.5–15.5)
WBC: 6.3 10*3/uL (ref 4.0–10.5)
nRBC: 0 % (ref 0.0–0.2)

## 2019-09-05 LAB — COMPREHENSIVE METABOLIC PANEL
ALT: 54 U/L — ABNORMAL HIGH (ref 0–44)
AST: 17 U/L (ref 15–41)
Albumin: 3.5 g/dL (ref 3.5–5.0)
Alkaline Phosphatase: 46 U/L (ref 38–126)
Anion gap: 7 (ref 5–15)
BUN: 18 mg/dL (ref 8–23)
CO2: 27 mmol/L (ref 22–32)
Calcium: 8.9 mg/dL (ref 8.9–10.3)
Chloride: 106 mmol/L (ref 98–111)
Creatinine, Ser: 0.9 mg/dL (ref 0.61–1.24)
GFR calc Af Amer: >60 mL/min (ref >60)
GFR calc non Af Amer: >60 mL/min (ref >60)
Glucose, Bld: 112 mg/dL — ABNORMAL HIGH (ref 70–99)
Potassium: 4.2 mmol/L (ref 3.5–5.1)
Sodium: 140 mmol/L (ref 135–145)
Total Bilirubin: 0.8 mg/dL (ref 0.3–1.2)
Total Protein: 6.3 g/dL — ABNORMAL LOW (ref 6.5–8.1)

## 2019-09-05 LAB — LACTATE DEHYDROGENASE: LDH: 178 U/L (ref 98–192)

## 2019-09-05 LAB — RETICULOCYTES
Immature Retic Fract: 14.3 % (ref 2.3–15.9)
RBC.: 3.8 MIL/uL — ABNORMAL LOW (ref 4.22–5.81)
Retic Count, Absolute: 78.7 10*3/uL (ref 19.0–186.0)
Retic Ct Pct: 2.1 % (ref 0.4–3.1)

## 2019-09-05 MED ORDER — FOLIC ACID 1 MG PO TABS: 1.0000 mg | ORAL_TABLET | Freq: Every day | ORAL | 4 refills | Status: DC

## 2019-09-05 MED ORDER — ACETAMINOPHEN 325 MG PO TABS
650.0000 mg | ORAL_TABLET | Freq: Once | ORAL | Status: AC
  Administered 2019-09-05: 650 mg via ORAL
  Filled 2019-09-05: qty 2

## 2019-09-05 MED ORDER — SODIUM CHLORIDE 0.9 % IV SOLN: Freq: Once | INTRAVENOUS | Status: AC

## 2019-09-05 MED ORDER — DIPHENHYDRAMINE HCL 50 MG/ML IJ SOLN
50.0000 mg | Freq: Once | INTRAMUSCULAR | Status: AC
  Administered 2019-09-05: 10:00:00 50 mg via INTRAVENOUS
  Filled 2019-09-05: qty 1

## 2019-09-05 MED ORDER — SODIUM CHLORIDE 0.9 % IV SOLN
375.0000 mg/m2 | Freq: Once | INTRAVENOUS | Status: AC
  Administered 2019-09-05: 11:00:00 900 mg via INTRAVENOUS
  Filled 2019-09-05: qty 40

## 2019-09-05 MED ORDER — FAMOTIDINE IN NACL 20-0.9 MG/50ML-% IV SOLN
20.0000 mg | Freq: Once | INTRAVENOUS | Status: AC
  Administered 2019-09-05: 20 mg via INTRAVENOUS
  Filled 2019-09-05: qty 50

## 2019-09-05 NOTE — Progress Notes (Signed)
Patient presents today for treatment and follow up appointment with Dr. Delton Coombes. MAR reviewed. Labs reviewed by Dr. Delton Coombes. Patient has no complaints of any changes since the last visit. Message received from [facility] LPN to proceed with treatment. Order received to draw a Retic. Retic to be done off of the purple top in the lab per [place].   Treatment given today per MD orders. Tolerated infusion without adverse affects. Rituxan stopped at 1414 due to patient had to leave for another Doctor appointment in [place] and transportation concerns. Per Lupita Raider RN , " May discharge patient now per Dr. Delton Coombes ." Vital signs stable. No complaints at this time. Discharged from clinic ambulatory. F/U with [facility] as scheduled.

## 2019-09-05 NOTE — Patient Instructions (Signed)
[facility] Discharge Instructions for Patients Receiving Chemotherapy  Today you received the following chemotherapy agents   To help prevent nausea and vomiting after your treatment, we encourage you to take your nausea medication   If you develop nausea and vomiting that is not controlled by your nausea medication, call the clinic.   BELOW ARE SYMPTOMS THAT SHOULD BE REPORTED IMMEDIATELY:  *FEVER GREATER THAN 100.5 F  *CHILLS WITH OR WITHOUT FEVER  NAUSEA AND VOMITING THAT IS NOT CONTROLLED WITH YOUR NAUSEA MEDICATION  *UNUSUAL SHORTNESS OF BREATH  *UNUSUAL BRUISING OR BLEEDING  TENDERNESS IN MOUTH AND THROAT WITH OR WITHOUT PRESENCE OF ULCERS  *URINARY PROBLEMS  *BOWEL PROBLEMS  UNUSUAL RASH Items with * indicate a potential emergency and should be followed up as soon as possible.  Feel free to call the clinic should you have any questions or concerns. The clinic phone number is [phone].  Please show the CHEMO ALERT CARD at check-in to the Emergency Department and triage nurse.   

## 2019-09-05 NOTE — Assessment & Plan Note (Signed)
1.  Autoimmune hemolytic anemia: -Admission to the hospital on 08/03/2019 with hemoglobin 6.3.  Direct Coombs test IgG was positive. -Prednisone 120 mg daily started on 08/05/2019, currently on 60 mg daily. -Weekly rituximab x3 from 08/15/2019 through 08/29/2019. -He has tolerated rituximab very well.  I have reviewed his labs. -Hemoglobin improved to 13.2.  LDH is 178.  Reticulocyte count is 2.1%. -I will further cut back on the prednisone to 40 mg daily. -We will proceed with last cycle 4 of rituximab today.  I will reevaluate him in 2 weeks with repeat labs.  2.  Lower extremity swelling: -He will take Lasix 20 mg every other day.  3.  Diffuse large B-cell lymphoma: -Originally diagnosed in 2006, treated with R-CHOP with complete response on PET scan. -Relapse of lymphoma on 08/31/2005, treated with salvage chemotherapy followed by stem cell transplant on 01/15/2006. -He received rituximab weekly x4 followed by every 6 months for 2 years completed on 12/05/2007. -PET scan on 08/14/2019 did not show any evidence of recurrence of lymphoma.  He does not have any B symptoms.

## 2019-09-05 NOTE — Progress Notes (Signed)
[address], [place] [address]   CLINIC:  Medical Oncology/Hematology  PCP:  [facility], [place] [address] [phone]   REASON FOR VISIT:  Follow-up for large B-cell lymphoma and newly diagnosed autoimmune hemolytic anemia.  CURRENT THERAPY: Weekly rituximab and prednisone.  BRIEF ONCOLOGIC HISTORY:  Oncology History Overview Note  S/P chemotherapy following stabilization of the spine.  Initial biopsy was on 07/13/2005 on his bone marrow.  Thoracic vertebra was biopsied on 02/04/2005 and mediastinum on 02/03/2005.  S/P R-CHOP and achieved an incomplete response, then underwent ICE chemotherapy pre-transplant and total body radiation with autologous stem cell transplant on 01/15/2006 and maintained on Rituxan for 2 years.  Thus far without recurrence.   DLBCL (diffuse large B cell lymphoma) ([place])  01/27/2005 Imaging   PET- diffuse activity  involving the L cervical area, extensive activity in the mediastinumn particularly in the R paratracheal area and to a lesser degree in the anterior mediastinum.  Lg area in porta caval nodes in upper abd and diffuse bone activity   02/03/2005 Pathology Results   Mediastinal lymph node demonstrating diffuse large cell lymphoma, B-type.  Vertebral bone biopsy demonstrating diffuse large b-cell lymphoma   03/11/2005 - 08/04/2005 Chemotherapy   R-CHOP x 8 cycles   05/04/2005 Imaging   PET- near resolution of previously identified uptake.   07/13/2005 Bone Marrow Biopsy   Bone marrow aspiration and biopsy is negative for lymphoma   08/31/2005 Imaging   PET- further response to therapy with findings consistent with residual disease.  Hypermetabolic activity identified within the anterior mediastinal lymph node (smaller but still pathologic).  Borderline hypermetabolic small right pleural effusion   10/20/2005 - 11/13/2005 Chemotherapy   R-ICE x 2 cycles at [facility]   01/08/2006 -  01/09/2006 Chemotherapy   Cyclophosphamide 600 mg daily x 2   01/10/2006 - 01/14/2006 Radiation Therapy   Total body radiation twice daily   01/15/2006 Bone Marrow Transplant   St[facility] cell [facility] at [facility]   04/05/2006 - 12/05/2007 Chemotherapy   Rituxan weekly x 4 every 6 months x 2 years      CANCER STAGING: Cancer Staging DLBCL (diffuse large B cell lymphoma) ([place]) Staging form: Lymphoid Neoplasms, AJCC 6th Edition - Clinical: Stage IV - Signed by Baird Cancer, PA on 04/29/2011    INTERVAL HISTORY:  Mr. Clubb 69 y.o. male seen for follow-up of autoimmune hemolytic anemia.  He is taking prednisone 60 mg daily.  Reports appetite and energy levels of 75%.  Shortness of breath on exertion is present.  Leg swelling is stable.  He is taking Lasix every other day.  Denies any fevers, night sweats or weight loss.  No chills reported.  REVIEW OF SYSTEMS:  Review of Systems  Cardiovascular: Positive for leg swelling.  All other systems reviewed and are negative.    PAST MEDICAL/SURGICAL HISTORY:  Past Medical History:  Diagnosis Date  . Asthma    as child  . Diabetes mellitus without complication ([place])   . DLBCL (diffuse large B cell lymphoma) ([place]) 02/28/2009  . Edema   . Heart failure, diastolic, chronic ([place])    patient denies  . Hyperlipidemia, mixed   . Hypertension   . IDA (iron deficiency anemia)   . Large cell lymphoma ([place]) 01/2005   autologous stem cell transplant 12/2005  . Obesity, morbid (more than 100 lbs over ideal weight or BMI > 40) ([facility])   . Venous insufficiency 04/29/2011  Past Surgical History:  Procedure Laterality Date  . BACK SURGERY  2006   T6 vertebrae removed/titanium placed  . CATARACT EXTRACTION W/PHACO Right 03/31/2019   Procedure: CATARACT EXTRACTION PHACO AND INTRAOCULAR LENS PLACEMENT (IOC);  Surgeon: Baruch Goldmann, MD;  Location: [facility];  Service: Ophthalmology;  Laterality: Right;  CDE: 3.21  . CATARACT EXTRACTION W/PHACO Left 04/14/2019    Procedure: CATARACT EXTRACTION PHACO AND INTRAOCULAR LENS PLACEMENT (IOC);  Surgeon: Baruch Goldmann, MD;  Location: [facility];  Service: Ophthalmology;  Laterality: Left;  left - pt knows to arrive at 7:45, CDE: 3.55  . COLONOSCOPY  11/24/2004   Polyps in the left colon ablated/removed as described above.  Two submucosal lesions consistent with lipomas as described above not  manipulated./ Normal rectum  . COLONOSCOPY  06/20/2012   MULTIPLE RECTAL AND COLONIC POLYPS  . COLONOSCOPY N/A 03/08/2015   RMR: Capacious, redundant colon. Multiple colonic and rectosigmoid polyps removed. ablated as described above. colonic lipoma abnormal appearing terminal ileum likely a variant of normal). Howeverwith history  biopsies obtained.   . ESOPHAGOGASTRODUODENOSCOPY N/A 03/08/2015   RMR: Hiatal hernia Polypoid gastric mucosa with multiple gastric polyps. largest polyp removed via snare polypectomgy hemostasis clip placed at base. Status post gastric biopsy. Status post video capsule placement.   Marland Kitchen GIVENS CAPSULE STUDY N/A 03/08/2015   Procedure: GIVENS CAPSULE STUDY;  Surgeon: Daneil Dolin, MD;  Location: [facility];  Service: Endoscopy;  Laterality: N/A;  . LIMBAL STEM CELL TRANSPLANT    . LUNG BIOPSY  6/06  . MULTIPLE TOOTH EXTRACTIONS  04/2005  . port a cath placement    . PORT-A-CATH REMOVAL  09/08/2012   Procedure: REMOVAL PORT-A-CATH;  Surgeon: Melrose Nakayama, MD;  Location: [facility];  Service: Thoracic;  Laterality: N/A;     SOCIAL HISTORY:  Social History   Socioeconomic History  . Marital status: Married    Spouse name: Not on file  . Number of children: 2  . Years of education: Not on file  . Highest education level: Not on file  Occupational History  . Occupation: disability due to back    Employer: UNEMPLOYED  Tobacco Use  . Smoking status: Former Smoker    Packs/day: 0.50    Years: 15.00    Pack years: 7.50    Quit date: 11/11/2002    Years since quitting: 16.8  . Smokeless  tobacco: Never Used  Substance and Sexual Activity  . Alcohol use: No    Alcohol/week: 0.0 standard drinks  . Drug use: No  . Sexual activity: Yes    Birth control/protection: None  Other Topics Concern  . Not on file  Social History Narrative   Married   No regular exercise   Social Determinants of Health   Financial Resource Strain:   . Difficulty of Paying Living Expenses: Not on file  Food Insecurity:   . Worried About Charity fundraiser in the Last Year: Not on file  . Ran Out of Food in the Last Year: Not on file  Transportation Needs:   . Lack of Transportation (Medical): Not on file  . Lack of Transportation (Non-Medical): Not on file  Physical Activity:   . Days of Exercise per Week: Not on file  . Minutes of Exercise per Session: Not on file  Stress:   . Feeling of Stress : Not on file  Social Connections:   . Frequency of Communication with Friends and Family: Not on file  . Frequency of Social  Gatherings with Friends and Family: Not on file  . Attends Religious Services: Not on file  . Active Member of Clubs or Organizations: Not on file  . Attends Archivist Meetings: Not on file  . Marital Status: Not on file  Intimate Partner Violence:   . Fear of Current or Ex-Partner: Not on file  . Emotionally Abused: Not on file  . Physically Abused: Not on file  . Sexually Abused: Not on file    FAMILY HISTORY:  Family History  Problem Relation Age of Onset  . Cancer Mother        lung  . Cancer Father        prostate  . Colon cancer Neg Hx   . Diabetes Neg Hx     CURRENT MEDICATIONS:  Outpatient Encounter Medications as of 09/05/2019  Medication Sig  . apixaban (ELIQUIS) 5 MG TABS tablet Take 1 tablet (5 mg total) by mouth 2 (two) times daily.  Marland Kitchen glimepiride (AMARYL) 1 MG tablet Take 1 tablet (1 mg total) by mouth 2 (two) times daily with a meal. take 1 tablet by mouth twice daily WITH BREAKFAST  . metoprolol tartrate (LOPRESSOR) 25 MG tablet  Take 1 tablet (25 mg total) by mouth 2 (two) times daily.  . pantoprazole (PROTONIX) 40 MG tablet Take 1 tablet (40 mg total) by mouth daily.  . predniSONE (DELTASONE) 20 MG tablet Take 5 tablets (100 mg total) by mouth daily with breakfast.  . riTUXimab in sodium chloride 0.9 % 250 mL Inject into the vein once a week. Weekly x 4 weeks  . rosuvastatin (CRESTOR) 40 MG tablet Take 40 mg by mouth daily.  . clotrimazole-betamethasone (LOTRISONE) cream Apply 1 application topically 2 (two) times daily as needed (leg infection).   . folic acid (FOLVITE) 1 MG tablet Take 1 tablet (1 mg total) by mouth daily.  . furosemide (LASIX) 20 MG tablet Take 20 mg by mouth daily as needed for fluid or edema.    No facility-administered encounter medications on file as of 09/05/2019.    ALLERGIES:  Allergies  Allergen Reactions  . Penicillins Rash    Has patient had a PCN reaction causing immediate rash, facial/tongue/throat swelling, SOB or lightheadedness with hypotension: Unknown Has patient had a PCN reaction causing severe rash involving mucus membranes or skin necrosis: Unknown Has patient had a PCN reaction that required hospitalization: Unknown Has patient had a PCN reaction occurring within the last 10 years: No If all of the above answers are "NO", then may proceed with Cephalosporin use.      PHYSICAL EXAM:  ECOG Performance status: 1  Vitals:   09/05/19 0827  BP: (!) 159/81  Pulse: 73  Resp: 18  Temp: (!) 97.3 F (36.3 C)  SpO2: 99%   Filed Weights   09/05/19 0827  Weight: 270 lb (122.5 kg)    Physical Exam Vitals reviewed.  Constitutional:      Appearance: Normal appearance.  Cardiovascular:     Rate and Rhythm: Normal rate and regular rhythm.     Heart sounds: Normal heart sounds.  Pulmonary:     Effort: Pulmonary effort is normal.     Breath sounds: Normal breath sounds.  Abdominal:     General: There is no distension.     Palpations: Abdomen is soft. There is no  mass.  Lymphadenopathy:     Cervical: No cervical adenopathy.  Skin:    General: Skin is warm.  Neurological:     General:  No focal deficit present.     Mental Status: He is alert and oriented to person, place, and time.  Psychiatric:        Mood and Affect: Mood normal.        Behavior: Behavior normal.      LABORATORY DATA:  I have reviewed the labs as listed.  CBC    Component Value Date/Time   WBC 6.3 09/05/2019 0805   RBC 3.74 (L) 09/05/2019 0805   RBC 3.80 (L) 09/05/2019 0805   HGB 13.2 09/05/2019 0805   HCT 40.4 09/05/2019 0805   PLT 160 09/05/2019 0805   MCV 108.0 (H) 09/05/2019 0805   MCH 35.3 (H) 09/05/2019 0805   MCHC 32.7 09/05/2019 0805   RDW 14.4 09/05/2019 0805   LYMPHSABS 0.4 (L) 09/05/2019 0805   MONOABS 0.5 09/05/2019 0805   EOSABS 0.0 09/05/2019 0805   BASOSABS 0.0 09/05/2019 0805   CMP Latest Ref Rng & Units 09/05/2019 08/29/2019 08/22/2019  Glucose 70 - 99 mg/dL 112(H) 129(H) 111(H)  BUN 8 - 23 mg/dL 18 18 18   Creatinine 0.61 - 1.24 mg/dL 0.90 0.92 0.91  Sodium 135 - 145 mmol/L 140 139 141  Potassium 3.5 - 5.1 mmol/L 4.2 4.1 4.0  Chloride 98 - 111 mmol/L 106 105 106  CO2 22 - 32 mmol/L 27 26 25   Calcium 8.9 - 10.3 mg/dL 8.9 8.8(L) 9.0  Total Protein 6.5 - 8.1 g/dL 6.3(L) 6.3(L) 6.2(L)  Total Bilirubin 0.3 - 1.2 mg/dL 0.8 0.9 1.0  Alkaline Phos 38 - 126 U/L 46 47 42  AST 15 - 41 U/L 17 15 15   ALT 0 - 44 U/L 54(H) 46(H) 36       DIAGNOSTIC IMAGING:  I have independently reviewed the scans and discussed with the patient.   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  Autoimmune hemolytic anemia: -Admission to the hospital on 08/03/2019 with hemoglobin 6.3.  Direct Coombs test IgG was positive. -Prednisone 120 mg daily started on 08/05/2019, currently on 60 mg daily. -Weekly rituximab x3 from 08/15/2019 through 08/29/2019. -He has tolerated rituximab very well.  I have reviewed his labs. -Hemoglobin improved to 13.2.   LDH is 178.  Reticulocyte count is 2.1%. -I will further cut back on the prednisone to 40 mg daily. -We will proceed with last cycle 4 of rituximab today.  I will reevaluate him in 2 weeks with repeat labs.  2.  Lower extremity swelling: -He will take Lasix 20 mg every other day.  3.  Diffuse large B-cell lymphoma: -Originally diagnosed in 2006, treated with R-CHOP with complete response on PET scan. -Relapse of lymphoma on 08/31/2005, treated with salvage chemotherapy followed by stem cell transplant on 01/15/2006. -He received rituximab weekly x4 followed by every 6 months for 2 years completed on 12/05/2007. -PET scan on 08/14/2019 did not show any evidence of recurrence of lymphoma.  He does not have any B symptoms.  Total time spent is 30 minutes with more than 50% of the time spent face-to-face discussing lab results and treatment plan.    Orders placed this encounter:  Orders Placed This Encounter  Procedures  . Reticulocytes  . Reticulocytes      Derek Jack, MD [place] [phone]

## 2019-09-05 NOTE — Patient Instructions (Addendum)
[facility] at [facility] Discharge Instructions  You were seen today by Dr. Delton Coombes. He went over your recent lab results. Start taking 2 tablets of prednisone everyday until we see you back in 2 weeks. He will see you back in 2 weeks for labs and follow up.   Thank you for choosing [facility] at [facility] to provide your oncology and hematology care.  To afford each patient quality time with our provider, please arrive at least 15 minutes before your scheduled appointment time.   If you have a lab appointment with the [facility] please come in thru the  Main Entrance and check in at the main information desk  You need to re-schedule your appointment should you arrive 10 or more minutes late.  We strive to give you quality time with our providers, and arriving late affects you and other patients whose appointments are after yours.  Also, if you no show three or more times for appointments you may be dismissed from the clinic at the providers discretion.     Again, thank you for choosing [facility].  Our hope is that these requests will decrease the amount of time that you wait before being seen by our physicians.       _____________________________________________________________  Should you have questions after your visit to [facility], please contact our office at [phone] between the hours of 8:00 a.m. and 4:30 p.m.  Voicemails left after 4:00 p.m. will not be returned until the following business day.  For prescription refill requests, have your pharmacy contact our office and allow 72 hours.    Cancer Center Support Programs:   > Cancer Support Group  2nd Tuesday of the month 1pm-2pm, Journey Room

## 2019-09-19 ENCOUNTER — Encounter (HOSPITAL_COMMUNITY): Payer: Self-pay | Admitting: Hematology

## 2019-09-19 ENCOUNTER — Other Ambulatory Visit: Payer: Self-pay

## 2019-09-19 ENCOUNTER — Inpatient Hospital Stay (HOSPITAL_BASED_OUTPATIENT_CLINIC_OR_DEPARTMENT_OTHER): Payer: Medicare Other | Admitting: Hematology

## 2019-09-19 ENCOUNTER — Inpatient Hospital Stay (HOSPITAL_COMMUNITY): Payer: Medicare Other

## 2019-09-19 VITALS — BP 135/78 | HR 89 | Temp 97.3°F | Resp 18 | Wt 273.4 lb

## 2019-09-19 DIAGNOSIS — D5919 Other autoimmune hemolytic anemia: Secondary | ICD-10-CM

## 2019-09-19 DIAGNOSIS — D696 Thrombocytopenia, unspecified: Secondary | ICD-10-CM

## 2019-09-19 DIAGNOSIS — Z5112 Encounter for antineoplastic immunotherapy: Secondary | ICD-10-CM | POA: Diagnosis not present

## 2019-09-19 DIAGNOSIS — D591 Autoimmune hemolytic anemia, unspecified: Secondary | ICD-10-CM | POA: Diagnosis not present

## 2019-09-19 DIAGNOSIS — E119 Type 2 diabetes mellitus without complications: Secondary | ICD-10-CM | POA: Diagnosis not present

## 2019-09-19 DIAGNOSIS — M7989 Other specified soft tissue disorders: Secondary | ICD-10-CM | POA: Diagnosis not present

## 2019-09-19 DIAGNOSIS — I110 Hypertensive heart disease with heart failure: Secondary | ICD-10-CM | POA: Diagnosis not present

## 2019-09-19 DIAGNOSIS — I5032 Chronic diastolic (congestive) heart failure: Secondary | ICD-10-CM | POA: Diagnosis not present

## 2019-09-19 DIAGNOSIS — Z8572 Personal history of non-Hodgkin lymphomas: Secondary | ICD-10-CM | POA: Diagnosis not present

## 2019-09-19 LAB — COMPREHENSIVE METABOLIC PANEL
ALT: 47 U/L — ABNORMAL HIGH (ref 0–44)
AST: 17 U/L (ref 15–41)
Albumin: 3.6 g/dL (ref 3.5–5.0)
Alkaline Phosphatase: 50 U/L (ref 38–126)
Anion gap: 10 (ref 5–15)
BUN: 19 mg/dL (ref 8–23)
CO2: 26 mmol/L (ref 22–32)
Calcium: 9 mg/dL (ref 8.9–10.3)
Chloride: 102 mmol/L (ref 98–111)
Creatinine, Ser: 1.03 mg/dL (ref 0.61–1.24)
GFR calc Af Amer: >60 mL/min (ref >60)
GFR calc non Af Amer: >60 mL/min (ref >60)
Glucose, Bld: 256 mg/dL — ABNORMAL HIGH (ref 70–99)
Potassium: 4.1 mmol/L (ref 3.5–5.1)
Sodium: 138 mmol/L (ref 135–145)
Total Bilirubin: 0.5 mg/dL (ref 0.3–1.2)
Total Protein: 6.3 g/dL — ABNORMAL LOW (ref 6.5–8.1)

## 2019-09-19 LAB — CBC WITH DIFFERENTIAL/PLATELET
Abs Immature Granulocytes: 0.09 10*3/uL — ABNORMAL HIGH (ref 0.00–0.07)
Basophils Absolute: 0 10*3/uL (ref 0.0–0.1)
Basophils Relative: 0 %
Eosinophils Absolute: 0 10*3/uL (ref 0.0–0.5)
Eosinophils Relative: 0 %
HCT: 44.1 % (ref 39.0–52.0)
Hemoglobin: 14.4 g/dL (ref 13.0–17.0)
Immature Granulocytes: 1 %
Lymphocytes Relative: 13 %
Lymphs Abs: 0.9 10*3/uL (ref 0.7–4.0)
MCH: 33.6 pg (ref 26.0–34.0)
MCHC: 32.7 g/dL (ref 30.0–36.0)
MCV: 103 fL — ABNORMAL HIGH (ref 80.0–100.0)
Monocytes Absolute: 0.5 10*3/uL (ref 0.1–1.0)
Monocytes Relative: 7 %
Neutro Abs: 5.4 10*3/uL (ref 1.7–7.7)
Neutrophils Relative %: 79 %
Platelets: 157 10*3/uL (ref 150–400)
RBC: 4.28 MIL/uL (ref 4.22–5.81)
RDW: 13.8 % (ref 11.5–15.5)
WBC: 7 10*3/uL (ref 4.0–10.5)
nRBC: 0 % (ref 0.0–0.2)

## 2019-09-19 LAB — RETICULOCYTES
Immature Retic Fract: 16 % — ABNORMAL HIGH (ref 2.3–15.9)
RBC.: 4.31 MIL/uL (ref 4.22–5.81)
Retic Count, Absolute: 71.1 10*3/uL (ref 19.0–186.0)
Retic Ct Pct: 1.7 % (ref 0.4–3.1)

## 2019-09-19 MED ORDER — PREDNISONE 10 MG PO TABS: ORAL_TABLET | ORAL | 0 refills | Status: DC

## 2019-09-19 NOTE — Assessment & Plan Note (Signed)
1.  Autoimmune hemolytic anemia: -Admission to the hospital on 08/03/2019 with hemoglobin 6.3.  Direct Coombs test IgG was positive. -Prednisone 120 mg daily started on 08/05/2019. -4 doses of weekly rituximab from 08/15/2019 through 09/05/2019. -he is currently taking prednisone 40 mg daily.  He is tolerating it very well. -I have reviewed his labs.  Hemoglobin today is 14.4.  Reticulocyte count is 1.7.  Bilirubin is normal. -I have suggested him to cut back on prednisone to 20 mg daily.  I have sent a new prescription for prednisone 10 mg tablets to be taken as prescribed.  I will see him back in 2 weeks for follow-up with repeat labs.  2.  Leg swelling: -He is taking Lasix 20 mg 2 tablets every other day.  3.  Diffuse large B-cell lymphoma: -Originally diagnosed in 2006, treated with R-CHOP with complete response on PET scan. -Relapse of lymphoma on 08/31/2005, treated with salvage chemotherapy followed by stem cell transplant on 01/15/2006. -He received rituximab weekly x4 followed by every 6 months for 2 years completed on 12/05/2007. -PET scan on 08/14/2019 did not show any evidence of recurrence of lymphoma.  He does not have any B symptoms.

## 2019-09-19 NOTE — Progress Notes (Signed)
[place], [place] [address]   CLINIC:  Medical Oncology/Hematology  PCP:  [facility], [address] [place] [address] [phone]   REASON FOR VISIT:  Follow-up for large B-cell lymphoma and newly diagnosed autoimmune hemolytic anemia.  CURRENT THERAPY: Weekly rituximab and prednisone.  BRIEF ONCOLOGIC HISTORY:  Oncology History Overview Note  S/P chemotherapy following stabilization of the spine.  Initial biopsy was on 07/13/2005 on his bone marrow.  Thoracic vertebra was biopsied on 02/04/2005 and mediastinum on 02/03/2005.  S/P R-CHOP and achieved an incomplete response, then underwent ICE chemotherapy pre-transplant and total body radiation with autologous stem cell transplant on 01/15/2006 and maintained on Rituxan for 2 years.  Thus far without recurrence.   DLBCL (diffuse large B cell lymphoma) ([facility])  01/27/2005 Imaging   PET- diffuse activity  involving the L cervical area, extensive activity in the mediastinumn particularly in the R paratracheal area and to a lesser degree in the anterior mediastinum.  Lg area in porta caval nodes in upper abd and diffuse bone activity   02/03/2005 Pathology Results   Mediastinal lymph node demonstrating diffuse large cell lymphoma, B-type.  Vertebral bone biopsy demonstrating diffuse large b-cell lymphoma   03/11/2005 - 08/04/2005 Chemotherapy   R-CHOP x 8 cycles   05/04/2005 Imaging   PET- near resolution of previously identified uptake.   07/13/2005 Bone Marrow Biopsy   Bone marrow aspiration and biopsy is negative for lymphoma   08/31/2005 Imaging   PET- further response to therapy with findings consistent with residual disease.  Hypermetabolic activity identified within the anterior mediastinal lymph node (smaller but still pathologic).  Borderline hypermetabolic small right pleural effusion   10/20/2005 - 11/13/2005 Chemotherapy   R-[facility] x 2 [facility] at [facility]   01/08/2006 -  01/09/2006 Chemotherapy   Cyclophosphamide 600 mg daily x 2   01/10/2006 - 01/14/2006 Radiation Therapy   Total body radiation twice daily   01/15/2006 Bone Marrow Transplant   Stem cell transplant at [facility]   04/05/2006 - 12/05/2007 Chemotherapy   Rituxan weekly x 4 every 6 months x 2 years      CANCER STAGING: Cancer Staging DLBCL (diffuse large B cell lymphoma) ([place]) Staging form: Lymphoid Neoplasms, AJCC 6th Edition - Clinical: Stage IV - Signed by Baird Cancer, PA on 04/29/2011    INTERVAL HISTORY:  [name] 69 y.o. [name] seen for follow-up of autoimmune hemolytic anemia.  [name] is taking prednisone 20 mg daily without any problems.  [name] is also taking Lasix 2 tablets every other day.  Reports appetite and energy levels of 75%.  Numbness in the feet is stable.  Denies any bleeding per rectum or melena.  REVIEW OF SYSTEMS:  Review of Systems  Cardiovascular: Positive for leg swelling.  All other systems reviewed and are negative.    PAST MEDICAL/SURGICAL HISTORY:  Past Medical History:  Diagnosis Date  . Asthma    as child  . Diabetes mellitus without complication ([place] [facility])   . DLBCL (diffuse large B cell lymphoma) ([place]) 02/28/2009  . Edema   . Heart failure, diastolic, chronic ([place])    patient denies  . Hyperlipidemia, mixed   . Hypertension   . IDA (iron deficiency anemia)   . Large cell lymphoma ([place] [place]) 01/2005   autologous stem cell transplant 12/2005  . Obesity, morbid (more than 100 lbs over ideal weight or BMI > 40) ([facility])   . Venous insufficiency 04/29/2011   Past Surgical History:  Procedure  Laterality Date  . BACK SURGERY  2006   T6 vertebrae removed/titanium placed  . CATARACT EXTRACTION W/PHACO Right 03/31/2019   Procedure: CATARACT EXTRACTION PHACO AND INTRAOCULAR LENS PLACEMENT (IOC);  Surgeon: Baruch Goldmann, MD;  Location: [facility];  Service: Ophthalmology;  Laterality: Right;  CDE: 3.21  . CATARACT EXTRACTION W/PHACO Left 04/14/2019   Procedure: CATARACT  EXTRACTION PHACO AND INTRAOCULAR LENS PLACEMENT (IOC);  Surgeon: Baruch Goldmann, MD;  Location: [facility];  Service: Ophthalmology;  Laterality: Left;  left - pt knows to arrive at 7:45, CDE: 3.55  . COLONOSCOPY  11/24/2004   Polyps in the left colon ablated/removed as described above.  Two submucosal lesions consistent with lipomas as described above not  manipulated./ Normal rectum  . COLONOSCOPY  06/20/2012   MULTIPLE RECTAL AND COLONIC POLYPS  . COLONOSCOPY N/A 03/08/2015   RMR: Capacious, redundant colon. Multiple colonic and rectosigmoid polyps removed. ablated as described above. colonic lipoma abnormal appearing terminal ileum likely a variant of normal). Howeverwith history  biopsies obtained.   . ESOPHAGOGASTRODUODENOSCOPY N/A 03/08/2015   RMR: Hiatal hernia Polypoid gastric mucosa with multiple gastric polyps. largest polyp removed via snare polypectomgy hemostasis clip placed at base. Status post gastric biopsy. Status post video capsule placement.   Marland Kitchen GIVENS CAPSULE STUDY N/A 03/08/2015   Procedure: GIVENS CAPSULE STUDY;  Surgeon: Daneil Dolin, MD;  Location: [facility];  Service: Endoscopy;  Laterality: N/A;  . LIMBAL STEM CELL TRANSPLANT    . LUNG BIOPSY  6/06  . MULTIPLE TOOTH EXTRACTIONS  04/2005  . port a cath placement    . PORT-A-CATH REMOVAL  09/08/2012   Procedure: REMOVAL PORT-A-CATH;  Surgeon: Melrose Nakayama, MD;  Location: [facility];  Service: Thoracic;  Laterality: N/A;     SOCIAL HISTORY:  Social History   Socioeconomic History  . Marital status: Married    Spouse name: Not on file  . Number of children: 2  . Years of education: Not on file  . Highest education level: Not on file  Occupational History  . Occupation: disability due to back    Employer: UNEMPLOYED  Tobacco Use  . Smoking status: Former Smoker    Packs/day: 0.50    Years: 15.00    Pack years: 7.50    Quit date: 11/11/2002    Years since quitting: 16.8  . Smokeless tobacco: Never Used   Substance and Sexual Activity  . Alcohol use: No    Alcohol/week: 0.0 standard drinks  . Drug use: No  . Sexual activity: Yes    Birth control/protection: None  Other Topics Concern  . Not on file  Social History Narrative   Married   No regular exercise   Social Determinants of Health   Financial Resource Strain:   . Difficulty of Paying Living Expenses: Not on file  Food Insecurity:   . Worried About Charity fundraiser in the Last Year: Not on file  . Ran Out of Food in the Last Year: Not on file  Transportation Needs:   . Lack of Transportation (Medical): Not on file  . Lack of Transportation (Non-Medical): Not on file  Physical Activity:   . Days of Exercise per Week: Not on file  . Minutes of Exercise per Session: Not on file  Stress:   . Feeling of Stress : Not on file  Social Connections:   . Frequency of Communication with Friends and Family: Not on file  . Frequency of Social Gatherings with Friends and Family:  Not on file  . Attends Religious Services: Not on file  . Active Member of Clubs or Organizations: Not on file  . Attends Archivist Meetings: Not on file  . Marital Status: Not on file  Intimate Partner Violence:   . Fear of Current or Ex-Partner: Not on file  . Emotionally Abused: Not on file  . Physically Abused: Not on file  . Sexually Abused: Not on file    FAMILY HISTORY:  Family History  Problem Relation Age of Onset  . Cancer Mother        lung  . Cancer Father        prostate  . Colon cancer Neg Hx   . Diabetes Neg Hx     CURRENT MEDICATIONS:  Outpatient Encounter Medications as of 09/19/2019  Medication Sig  . apixaban (ELIQUIS) 5 MG TABS tablet Take 1 tablet (5 mg total) by mouth 2 (two) times daily.  . folic acid (FOLVITE) 1 MG tablet Take 1 tablet (1 mg total) by mouth daily.  Marland Kitchen glimepiride (AMARYL) 1 MG tablet Take 1 tablet (1 mg total) by mouth 2 (two) times daily with a meal. take 1 tablet by mouth twice daily WITH  BREAKFAST  . metoprolol tartrate (LOPRESSOR) 25 MG tablet Take 1 tablet (25 mg total) by mouth 2 (two) times daily.  . pantoprazole (PROTONIX) 40 MG tablet Take 1 tablet (40 mg total) by mouth daily.  . riTUXimab in sodium chloride 0.9 % 250 mL Inject into the vein once a week. Weekly x 4 weeks  . rosuvastatin (CRESTOR) 40 MG tablet Take 40 mg by mouth daily.  . [DISCONTINUED] predniSONE (DELTASONE) 20 MG tablet Take 5 tablets (100 mg total) by mouth daily with breakfast.  . Blood Glucose Monitoring Suppl (ONE TOUCH ULTRA 2) w/Device KIT USE TO TEST BLOOD SUGAR ONCE D UTD  . clotrimazole-betamethasone (LOTRISONE) cream Apply 1 application topically 2 (two) times daily as needed (leg infection).   . furosemide (LASIX) 20 MG tablet Take 20 mg by mouth daily as needed for fluid or edema.   . Lancets (ONETOUCH DELICA PLUS [id number]) MISC USE TO TEST BLOOD SUGAR ONCE D  . ONETOUCH ULTRA test strip USE TO TEST BLOOD SUGAR ONCE D UTD  . predniSONE (DELTASONE) 10 MG tablet Take as directed.   No facility-administered encounter medications on file as of 09/19/2019.    ALLERGIES:  Allergies  Allergen Reactions  . Penicillins Rash    Has patient had a PCN reaction causing immediate rash, facial/tongue/throat swelling, SOB or lightheadedness with hypotension: Unknown Has patient had a PCN reaction causing severe rash involving mucus membranes or skin necrosis: Unknown Has patient had a PCN reaction that required hospitalization: Unknown Has patient had a PCN reaction occurring within the last 10 years: No If all of the above answers are "NO", then may proceed with Cephalosporin use.      PHYSICAL EXAM:  ECOG Performance status: 1  Vitals:   09/19/19 1017  BP: 135/78  Pulse: 89  Resp: 18  Temp: (!) 97.3 F (36.3 C)  SpO2: 99%   Filed Weights   09/19/19 1017  Weight: 273 lb 6.4 oz (124 kg)    Physical Exam Vitals reviewed.  Constitutional:      Appearance: Normal appearance.   Cardiovascular:     Rate and Rhythm: Normal rate and regular rhythm.     Heart sounds: Normal heart sounds.  Pulmonary:     Effort: Pulmonary effort is normal.  Breath sounds: Normal breath sounds.  Abdominal:     General: There is no distension.     Palpations: Abdomen is soft. There is no mass.  Lymphadenopathy:     Cervical: No cervical adenopathy.  Skin:    General: Skin is warm.  Neurological:     General: No focal deficit present.     Mental Status: [name] is alert and oriented to person, place, and time.  Psychiatric:        Mood and Affect: Mood normal.        Behavior: Behavior normal.      LABORATORY DATA:  I have reviewed the labs as listed.  CBC    Component Value Date/Time   WBC 7.0 09/19/2019 0947   RBC 4.28 09/19/2019 0947   RBC 4.31 09/19/2019 0947   HGB 14.4 09/19/2019 0947   HCT 44.1 09/19/2019 0947   PLT 157 09/19/2019 0947   MCV 103.0 (H) 09/19/2019 0947   MCH 33.6 09/19/2019 0947   MCHC 32.7 09/19/2019 0947   RDW 13.8 09/19/2019 0947   LYMPHSABS 0.9 09/19/2019 0947   MONOABS 0.5 09/19/2019 0947   EOSABS 0.0 09/19/2019 0947   BASOSABS 0.0 09/19/2019 0947   CMP Latest Ref Rng & Units 09/19/2019 09/05/2019 08/29/2019  Glucose 70 - 99 mg/dL 256(H) 112(H) 129(H)  BUN 8 - 23 mg/dL _0 Creatinine 0.61 - 1.24 mg/dL 1.03 0.90 0.92  Sodium 135 - 145 mmol/L 138 140 139  Potassium 3.5 - 5.1 mmol/L 4.1 4.2 4.1  Chloride 98 - 111 mmol/L 102 106 105  CO2 22 - 32 mmol/L _1 Calcium 8.9 - 10.3 mg/dL 9.0 8.9 8.8(L)  Total Protein 6.5 - 8.1 g/dL 6.3(L) 6.3(L) 6.3(L)  Total Bilirubin 0.3 - 1.2 mg/dL 0.5 0.8 0.9  Alkaline Phos 38 - 126 U/L 50 46 47  AST 15 - 41 U/L _2 ALT 0 - 44 U/L 47(H) 54(H) 46(H)       DIAGNOSTIC IMAGING:  I have independently reviewed the scans and discussed with the patient.   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  Autoimmune hemolytic anemia: -Admission to the hospital on 08/03/2019  with hemoglobin 6.3.  Direct [place] test IgG was positive. -Prednisone 120 mg daily started on 08/05/2019. -4 doses of weekly rituximab from 08/15/2019 through 09/05/2019. -[name] is currently taking prednisone 40 mg daily.  [name] is tolerating it very well. -I have reviewed his labs.  Hemoglobin today is 14.4.  Reticulocyte count is 1.7.  Bilirubin is normal. -I have suggested him to cut back on prednisone to 20 mg daily.  I have sent a new prescription for prednisone 10 mg tablets to be taken as prescribed.  I will see him back in 2 weeks for follow-up with repeat labs.  2.  Leg swelling: -[name] is taking Lasix 20 mg 2 tablets every other day.  3.  Diffuse large B-cell lymphoma: -Originally diagnosed in 2006, treated with R-CHOP with complete response on PET scan. -Relapse of lymphoma on 08/31/2005, treated with salvage chemotherapy followed by stem cell transplant on 01/15/2006. -[name] received rituximab weekly x4 followed by every 6 months for 2 years completed on 12/05/2007. -PET scan on 08/14/2019 did not show any evidence of recurrence of lymphoma.  [name] does not have any B symptoms.     Orders placed this encounter:  Orders Placed This Encounter  Procedures  . CBC with Differential/Platelet  . Comprehensive metabolic panel  .  Lactate dehydrogenase  . Reticulocytes      Derek Jack, MD [place] [phone]

## 2019-09-19 NOTE — Patient Instructions (Addendum)
[place] at [facility] Discharge Instructions  You were seen today by Dr. Delton Coombes. He went over your recent lab results. Start taking prednisone 20mg  daily. He will see you back in 2 weeks for labs and follow up.   Thank you for choosing [place] at [facility] to provide your oncology and hematology care.  To afford each patient quality time with our provider, please arrive at least 15 minutes before your scheduled appointment time.   If you have a lab appointment with the [facility] please come in thru the  Main Entrance and check in at the main information desk  You need to re-schedule your appointment should you arrive 10 or more minutes late.  We strive to give you quality time with our providers, and arriving late affects you and other patients whose appointments are after yours.  Also, if you no show three or more times for appointments you may be dismissed from the clinic at the providers discretion.     Again, thank you for choosing [facility].  Our hope is that these requests will decrease the amount of time that you wait before being seen by our physicians.       _____________________________________________________________  Should you have questions after your visit to [facility], please contact our office at [phone] between the hours of 8:00 a.m. and 4:30 p.m.  Voicemails left after 4:00 p.m. will not be returned until the following business day.  For prescription refill requests, have your pharmacy contact our office and allow 72 hours.    Cancer Center Support Programs:   > Cancer Support Group  2nd Tuesday of the month 1pm-2pm, Journey Room

## 2019-09-20 DIAGNOSIS — I10 Essential (primary) hypertension: Secondary | ICD-10-CM | POA: Diagnosis not present

## 2019-09-20 DIAGNOSIS — I872 Venous insufficiency (chronic) (peripheral): Secondary | ICD-10-CM | POA: Diagnosis not present

## 2019-09-20 DIAGNOSIS — Z0000 Encounter for general adult medical examination without abnormal findings: Secondary | ICD-10-CM | POA: Diagnosis not present

## 2019-09-20 DIAGNOSIS — M1991 Primary osteoarthritis, unspecified site: Secondary | ICD-10-CM | POA: Diagnosis not present

## 2019-09-20 DIAGNOSIS — I4891 Unspecified atrial fibrillation: Secondary | ICD-10-CM | POA: Diagnosis not present

## 2019-09-20 DIAGNOSIS — E1129 Type 2 diabetes mellitus with other diabetic kidney complication: Secondary | ICD-10-CM | POA: Diagnosis not present

## 2019-09-20 DIAGNOSIS — E782 Mixed hyperlipidemia: Secondary | ICD-10-CM | POA: Diagnosis not present

## 2019-09-20 DIAGNOSIS — Z1389 Encounter for screening for other disorder: Secondary | ICD-10-CM | POA: Diagnosis not present

## 2019-09-20 DIAGNOSIS — E119 Type 2 diabetes mellitus without complications: Secondary | ICD-10-CM | POA: Diagnosis not present

## 2019-09-20 DIAGNOSIS — Z0001 Encounter for general adult medical examination with abnormal findings: Secondary | ICD-10-CM | POA: Diagnosis not present

## 2019-09-20 DIAGNOSIS — C8580 Other specified types of non-Hodgkin lymphoma, unspecified site: Secondary | ICD-10-CM | POA: Diagnosis not present

## 2019-09-21 DIAGNOSIS — Z0001 Encounter for general adult medical examination with abnormal findings: Secondary | ICD-10-CM | POA: Diagnosis not present

## 2019-09-21 DIAGNOSIS — Z1389 Encounter for screening for other disorder: Secondary | ICD-10-CM | POA: Diagnosis not present

## 2019-09-21 DIAGNOSIS — E119 Type 2 diabetes mellitus without complications: Secondary | ICD-10-CM | POA: Diagnosis not present

## 2019-09-24 DIAGNOSIS — I4891 Unspecified atrial fibrillation: Secondary | ICD-10-CM | POA: Diagnosis not present

## 2019-09-24 DIAGNOSIS — E7849 Other hyperlipidemia: Secondary | ICD-10-CM | POA: Diagnosis not present

## 2019-09-24 DIAGNOSIS — I2510 Atherosclerotic heart disease of native coronary artery without angina pectoris: Secondary | ICD-10-CM | POA: Diagnosis not present

## 2019-09-24 DIAGNOSIS — E1129 Type 2 diabetes mellitus with other diabetic kidney complication: Secondary | ICD-10-CM | POA: Diagnosis not present

## 2019-10-02 ENCOUNTER — Inpatient Hospital Stay (HOSPITAL_COMMUNITY): Payer: Medicare Other | Attending: Hematology | Admitting: Hematology

## 2019-10-02 ENCOUNTER — Other Ambulatory Visit: Payer: Self-pay

## 2019-10-02 ENCOUNTER — Encounter (HOSPITAL_COMMUNITY): Payer: Self-pay | Admitting: Hematology

## 2019-10-02 ENCOUNTER — Other Ambulatory Visit (HOSPITAL_COMMUNITY): Payer: Self-pay | Admitting: Hematology

## 2019-10-02 ENCOUNTER — Inpatient Hospital Stay (HOSPITAL_COMMUNITY): Payer: Medicare Other

## 2019-10-02 VITALS — BP 137/69 | HR 87 | Temp 97.1°F | Resp 20 | Wt 268.2 lb

## 2019-10-02 DIAGNOSIS — M7989 Other specified soft tissue disorders: Secondary | ICD-10-CM | POA: Insufficient documentation

## 2019-10-02 DIAGNOSIS — D5919 Other autoimmune hemolytic anemia: Secondary | ICD-10-CM

## 2019-10-02 DIAGNOSIS — D591 Autoimmune hemolytic anemia, unspecified: Secondary | ICD-10-CM | POA: Insufficient documentation

## 2019-10-02 DIAGNOSIS — E1165 Type 2 diabetes mellitus with hyperglycemia: Secondary | ICD-10-CM | POA: Insufficient documentation

## 2019-10-02 DIAGNOSIS — Z8572 Personal history of non-Hodgkin lymphomas: Secondary | ICD-10-CM | POA: Insufficient documentation

## 2019-10-02 LAB — CBC WITH DIFFERENTIAL/PLATELET
Abs Immature Granulocytes: 0.07 10*3/uL (ref 0.00–0.07)
Basophils Absolute: 0 10*3/uL (ref 0.0–0.1)
Basophils Relative: 0 %
Eosinophils Absolute: 0 10*3/uL (ref 0.0–0.5)
Eosinophils Relative: 0 %
HCT: 41.9 % (ref 39.0–52.0)
Hemoglobin: 14.1 g/dL (ref 13.0–17.0)
Immature Granulocytes: 1 %
Lymphocytes Relative: 6 %
Lymphs Abs: 0.5 10*3/uL — ABNORMAL LOW (ref 0.7–4.0)
MCH: 33 pg (ref 26.0–34.0)
MCHC: 33.7 g/dL (ref 30.0–36.0)
MCV: 98.1 fL (ref 80.0–100.0)
Monocytes Absolute: 0.4 10*3/uL (ref 0.1–1.0)
Monocytes Relative: 5 %
Neutro Abs: 6.7 10*3/uL (ref 1.7–7.7)
Neutrophils Relative %: 88 %
Platelets: 164 10*3/uL (ref 150–400)
RBC: 4.27 MIL/uL (ref 4.22–5.81)
RDW: 13.3 % (ref 11.5–15.5)
WBC: 7.6 10*3/uL (ref 4.0–10.5)
nRBC: 0 % (ref 0.0–0.2)

## 2019-10-02 LAB — COMPREHENSIVE METABOLIC PANEL
ALT: 38 U/L (ref 0–44)
AST: 19 U/L (ref 15–41)
Albumin: 3.4 g/dL — ABNORMAL LOW (ref 3.5–5.0)
Alkaline Phosphatase: 59 U/L (ref 38–126)
Anion gap: 10 (ref 5–15)
BUN: 15 mg/dL (ref 8–23)
CO2: 25 mmol/L (ref 22–32)
Calcium: 9.2 mg/dL (ref 8.9–10.3)
Chloride: 100 mmol/L (ref 98–111)
Creatinine, Ser: 1.13 mg/dL (ref 0.61–1.24)
GFR calc Af Amer: >60 mL/min (ref >60)
GFR calc non Af Amer: >60 mL/min (ref >60)
Glucose, Bld: 525 mg/dL (ref 70–99)
Potassium: 4.5 mmol/L (ref 3.5–5.1)
Sodium: 135 mmol/L (ref 135–145)
Total Bilirubin: 0.5 mg/dL (ref 0.3–1.2)
Total Protein: 6.4 g/dL — ABNORMAL LOW (ref 6.5–8.1)

## 2019-10-02 LAB — RETICULOCYTES
Immature Retic Fract: 22.9 % — ABNORMAL HIGH (ref 2.3–15.9)
RBC.: 4.19 MIL/uL — ABNORMAL LOW (ref 4.22–5.81)
Retic Count, Absolute: 61.6 10*3/uL (ref 19.0–186.0)
Retic Ct Pct: 1.5 % (ref 0.4–3.1)

## 2019-10-02 LAB — LACTATE DEHYDROGENASE: LDH: 203 U/L — ABNORMAL HIGH (ref 98–192)

## 2019-10-02 MED ORDER — PANTOPRAZOLE SODIUM 40 MG PO TBEC: 40.0000 mg | DELAYED_RELEASE_TABLET | Freq: Every day | ORAL | 0 refills | Status: DC

## 2019-10-02 NOTE — Progress Notes (Signed)
Critical value alert:   Glucose 525  Dr. Delton Coombes aware Patient is to take his Amaryl 2 tablets twice daily  He is to report to the ER if he develops any weakness, dizziness, confusion, altered mental status, etc.     He verbalizes understanding.

## 2019-10-02 NOTE — Patient Instructions (Signed)
[place] at [facility] Discharge Instructions  You were seen today by Dr. Delton Coombes. He went over your recent lab results. Take 1 fluid pill every day. Cut back on your prednisone to 1.5 tablets a day for 2 weeks then 1 tablet a day until you see [place] back in the clinic. He will see you back in 4 weeks for labs and follow up.   Thank you for choosing [place] at [facility] to provide your oncology and hematology care.  To afford each patient quality time with our provider, please arrive at least 15 minutes before your scheduled appointment time.   If you have a lab appointment with the [facility] please come in thru the  Main Entrance and check in at the main information desk  You need to re-schedule your appointment should you arrive 10 or more minutes late.  We strive to give you quality time with our providers, and arriving late affects you and other patients whose appointments are after yours.  Also, if you no show three or more times for appointments you may be dismissed from the clinic at the providers discretion.     Again, thank you for choosing [facility].  Our hope is that these requests will decrease the amount of time that you wait before being seen by our physicians.       _____________________________________________________________  Should you have questions after your visit to [facility], please contact our office at [phone] between the hours of 8:00 a.m. and 4:30 p.m.  Voicemails left after 4:00 p.m. will not be returned until the following business day.  For prescription refill requests, have your pharmacy contact our office and allow 72 hours.    Cancer Center Support Programs:   > Cancer Support Group  2nd Tuesday of the month 1pm-2pm, Journey Room

## 2019-10-02 NOTE — Progress Notes (Signed)
[address], [place] [address]   CLINIC:  Medical Oncology/Hematology  PCP:  Redmond School, [place] [address] [phone]   REASON FOR VISIT:  Follow-up for large B-cell lymphoma and newly diagnosed autoimmune hemolytic anemia.  CURRENT THERAPY: Weekly rituximab and prednisone.  BRIEF ONCOLOGIC HISTORY:  Oncology History Overview Note  S/P chemotherapy following stabilization of the spine.  Initial biopsy was on 07/13/2005 on his bone marrow.  Thoracic vertebra was biopsied on 02/04/2005 and mediastinum on 02/03/2005.  S/P R-CHOP and achieved an incomplete response, then underwent ICE chemotherapy pre-transplant and total body radiation with autologous stem cell transplant on 01/15/2006 and maintained on Rituxan for 2 years.  Thus far without recurrence.   DLBCL (diffuse large B cell lymphoma) ([place])  01/27/2005 Imaging   PET- diffuse activity  involving the L cervical area, extensive activity in the mediastinumn particularly in the R paratracheal area and to a lesser degree in the anterior mediastinum.  Lg area in porta caval nodes in upper abd and diffuse bone activity   02/03/2005 Pathology Results   Mediastinal lymph node demonstrating diffuse large cell lymphoma, B-type.  Vertebral bone biopsy demonstrating diffuse large b-cell lymphoma   03/11/2005 - 08/04/2005 Chemotherapy   R-CHOP x 8 cycles   05/04/2005 Imaging   PET- near resolution of previously identified uptake.   07/13/2005 Bone Marrow Biopsy   Bone marrow aspiration and biopsy is negative for lymphoma   08/31/2005 Imaging   PET- further response to therapy with findings consistent with residual disease.  Hypermetabolic activity identified within the anterior mediastinal lymph node (smaller but still pathologic).  Borderline hypermetabolic small right pleural effusion   10/20/2005 - 11/13/2005 Chemotherapy   R-ICE x 2 cycles at [facility]   01/08/2006 -  01/09/2006 Chemotherapy   Cyclophosphamide 600 mg daily x 2   01/10/2006 - 01/14/2006 Radiation Therapy   Total body radiation twice daily   01/15/2006 Bone Marrow Transplant   Stem cell transplant at [facility]   04/05/2006 - 12/05/2007 Chemotherapy   Rituxan weekly x 4 every 6 months x 2 years      CANCER STAGING: Cancer Staging DLBCL (diffuse large B cell lymphoma) ([place]) Staging form: Lymphoid Neoplasms, AJCC 6th Edition - Clinical: Stage IV - Signed by Baird Cancer, PA on 04/29/2011    INTERVAL HISTORY:  Mr. Bailon 69 y.o. male seen for follow-up of autoimmune hemolytic anemia.  He is currently taking prednisone 20 mg daily without any problems.  Reports some shortness of breath on exertion.  Also reports some leg swellings which are chronic.  He is taking Lasix every other day.  REVIEW OF SYSTEMS:  Review of Systems  Respiratory: Positive for shortness of breath.   Cardiovascular: Positive for leg swelling.  All other systems reviewed and are negative.    PAST MEDICAL/SURGICAL HISTORY:  Past Medical History:  Diagnosis Date  . Asthma    as child  . Diabetes mellitus without complication ([place])   . DLBCL (diffuse large B cell lymphoma) ([place]) 02/28/2009  . Edema   . Heart failure, diastolic, chronic ([place])    patient denies  . Hyperlipidemia, mixed   . Hypertension   . IDA (iron deficiency anemia)   . Large cell lymphoma ([place]) 01/2005   autologous stem cell transplant 12/2005  . Obesity, morbid (more than 100 lbs over ideal weight or BMI > 40) (H[facility])   . Venous insufficiency 04/29/2011   Past Surgical History:  Procedure  Laterality Date  . BACK SURGERY  2006   T6 vertebrae removed/titanium placed  . CATARACT EXTRACTION W/PHACO Right 03/31/2019   Procedure: CATARACT EXTRACTION PHACO AND INTRAOCULAR LENS PLACEMENT (IOC);  Surgeon: Baruch Goldmann, MD;  Location: [facility];  Service: Ophthalmology;  Laterality: Right;  CDE: 3.21  . CATARACT EXTRACTION W/PHACO Left 04/14/2019    Procedure: CATARACT EXTRACTION PHACO AND INTRAOCULAR LENS PLACEMENT (IOC);  Surgeon: Baruch Goldmann, MD;  Location: [facility];  Service: Ophthalmology;  Laterality: Left;  left - pt knows to arrive at 7:45, CDE: 3.55  . COLONOSCOPY  11/24/2004   Polyps in the left colon ablated/removed as described above.  Two submucosal lesions consistent with lipomas as described above not  manipulated./ Normal rectum  . COLONOSCOPY  06/20/2012   MULTIPLE RECTAL AND COLONIC POLYPS  . COLONOSCOPY N/A 03/08/2015   RMR: Capacious, redundant colon. Multiple colonic and rectosigmoid polyps removed. ablated as described above. colonic lipoma abnormal appearing terminal ileum likely a variant of normal). Howeverwith history  biopsies obtained.   . ESOPHAGOGASTRODUODENOSCOPY N/A 03/08/2015   RMR: Hiatal hernia Polypoid gastric mucosa with multiple gastric polyps. largest polyp removed via snare polypectomgy hemostasis clip placed at base. Status post gastric biopsy. Status post video capsule placement.   Marland Kitchen GIVENS CAPSULE STUDY N/A 03/08/2015   Procedure: GIVENS CAPSULE STUDY;  Surgeon: Daneil Dolin, MD;  Location: [facility];  Service: Endoscopy;  Laterality: N/A;  . LIMBAL STEM CELL TRANSPLANT    . LUNG BIOPSY  6/06  . MULTIPLE TOOTH EXTRACTIONS  04/2005  . port a cath placement    . PORT-A-CATH REMOVAL  09/08/2012   Procedure: REMOVAL PORT-A-CATH;  Surgeon: Melrose Nakayama, MD;  Location: [facility];  Service: Thoracic;  Laterality: N/A;     SOCIAL HISTORY:  Social History   Socioeconomic History  . Marital status: Married    Spouse name: Not on file  . Number of children: 2  . Years of education: Not on file  . Highest education level: Not on file  Occupational History  . Occupation: disability due to back    Employer: UNEMPLOYED  Tobacco Use  . Smoking status: Former Smoker    Packs/day: 0.50    Years: 15.00    Pack years: 7.50    Quit date: 11/11/2002    Years since quitting: 16.9  . Smokeless  tobacco: Never Used  Substance and Sexual Activity  . Alcohol use: No    Alcohol/week: 0.0 standard drinks  . Drug use: No  . Sexual activity: Yes    Birth control/protection: None  Other Topics Concern  . Not on file  Social History Narrative   Married   No regular exercise   Social Determinants of Health   Financial Resource Strain:   . Difficulty of Paying Living Expenses: Not on file  Food Insecurity:   . Worried About Charity fundraiser in the Last Year: Not on file  . Ran Out of Food in the Last Year: Not on file  Transportation Needs:   . Lack of Transportation (Medical): Not on file  . Lack of Transportation (Non-Medical): Not on file  Physical Activity:   . Days of Exercise per Week: Not on file  . Minutes of Exercise per Session: Not on file  Stress:   . Feeling of Stress : Not on file  Social Connections:   . Frequency of Communication with Friends and Family: Not on file  . Frequency of Social Gatherings with Friends and Family:  Not on file  . Attends Religious Services: Not on file  . Active Member of Clubs or Organizations: Not on file  . Attends Archivist Meetings: Not on file  . Marital Status: Not on file  Intimate Partner Violence:   . Fear of Current or Ex-Partner: Not on file  . Emotionally Abused: Not on file  . Physically Abused: Not on file  . Sexually Abused: Not on file    FAMILY HISTORY:  Family History  Problem Relation Age of Onset  . Cancer Mother        lung  . Cancer Father        prostate  . Colon cancer Neg Hx   . Diabetes Neg Hx     CURRENT MEDICATIONS:  Outpatient Encounter Medications as of 10/02/2019  Medication Sig  . apixaban (ELIQUIS) 5 MG TABS tablet Take 1 tablet (5 mg total) by mouth 2 (two) times daily.  . Blood Glucose Monitoring Suppl (ONE TOUCH ULTRA 2) w/Device KIT USE TO TEST BLOOD SUGAR ONCE D UTD  . folic acid (FOLVITE) 1 MG tablet Take 1 tablet (1 mg total) by mouth daily.  Marland Kitchen glimepiride (AMARYL)  1 MG tablet Take 1 tablet (1 mg total) by mouth 2 (two) times daily with a meal. take 1 tablet by mouth twice daily WITH BREAKFAST  . Lancets (ONETOUCH DELICA PLUS [id number]) MISC USE TO TEST BLOOD SUGAR ONCE D  . metoprolol tartrate (LOPRESSOR) 25 MG tablet Take 1 tablet (25 mg total) by mouth 2 (two) times daily.  Glory Rosebush ULTRA test strip USE TO TEST BLOOD SUGAR ONCE D UTD  . predniSONE (DELTASONE) 10 MG tablet Take as directed.  . riTUXimab in sodium chloride 0.9 % 250 mL Inject into the vein once a week. Weekly x 4 weeks  . rosuvastatin (CRESTOR) 40 MG tablet Take 40 mg by mouth daily.  . [DISCONTINUED] pantoprazole (PROTONIX) 40 MG tablet Take 1 tablet (40 mg total) by mouth daily.  . [DISCONTINUED] pantoprazole (PROTONIX) 40 MG tablet Take 1 tablet (40 mg total) by mouth daily.  . clotrimazole-betamethasone (LOTRISONE) cream Apply 1 application topically 2 (two) times daily as needed (leg infection).   . furosemide (LASIX) 20 MG tablet Take 20 mg by mouth daily as needed for fluid or edema.    No facility-administered encounter medications on file as of 10/02/2019.    ALLERGIES:  Allergies  Allergen Reactions  . Penicillins Rash    Has patient had a PCN reaction causing immediate rash, facial/tongue/throat swelling, SOB or lightheadedness with hypotension: Unknown Has patient had a PCN reaction causing severe rash involving mucus membranes or skin necrosis: Unknown Has patient had a PCN reaction that required hospitalization: Unknown Has patient had a PCN reaction occurring within the last 10 years: No If all of the above answers are "NO", then may proceed with Cephalosporin use.      PHYSICAL EXAM:  ECOG Performance status: 1  Vitals:   10/02/19 1552  BP: 137/69  Pulse: 87  Resp: 20  Temp: (!) 97.1 F (36.2 C)  SpO2: 96%   Filed Weights   10/02/19 1551 10/02/19 1552  Weight: 268 lb 3.2 oz (121.7 kg) 268 lb 3.2 oz (121.7 kg)    Physical Exam Vitals reviewed.    Constitutional:      Appearance: Normal appearance.  Cardiovascular:     Rate and Rhythm: Normal rate and regular rhythm.     Heart sounds: Normal heart sounds.  Pulmonary:  Effort: Pulmonary effort is normal.     Breath sounds: Normal breath sounds.  Abdominal:     General: There is no distension.     Palpations: Abdomen is soft. There is no mass.  Lymphadenopathy:     Cervical: No cervical adenopathy.  Skin:    General: Skin is warm.  Neurological:     General: No focal deficit present.     Mental Status: He is alert and oriented to person, place, and time.  Psychiatric:        Mood and Affect: Mood normal.        Behavior: Behavior normal.      LABORATORY DATA:  I have reviewed the labs as listed.  CBC    Component Value Date/Time   WBC 7.6 10/02/2019 1521   RBC 4.27 10/02/2019 1521   RBC 4.19 (L) 10/02/2019 1521   HGB 14.1 10/02/2019 1521   HCT 41.9 10/02/2019 1521   PLT 164 10/02/2019 1521   MCV 98.1 10/02/2019 1521   MCH 33.0 10/02/2019 1521   MCHC 33.7 10/02/2019 1521   RDW 13.3 10/02/2019 1521   LYMPHSABS 0.5 (L) 10/02/2019 1521   MONOABS 0.4 10/02/2019 1521   EOSABS 0.0 10/02/2019 1521   BASOSABS 0.0 10/02/2019 1521   CMP Latest Ref Rng & Units 10/02/2019 09/19/2019 09/05/2019  Glucose 70 - 99 mg/dL 525(HH) 256(H) 112(H)  BUN 8 - 23 mg/dL 15 19 18   Creatinine 0.61 - 1.24 mg/dL 1.13 1.03 0.90  Sodium 135 - 145 mmol/L 135 138 140  Potassium 3.5 - 5.1 mmol/L 4.5 4.1 4.2  Chloride 98 - 111 mmol/L 100 102 106  CO2 22 - 32 mmol/L 25 26 27   Calcium 8.9 - 10.3 mg/dL 9.2 9.0 8.9  Total Protein 6.5 - 8.1 g/dL 6.4(L) 6.3(L) 6.3(L)  Total Bilirubin 0.3 - 1.2 mg/dL 0.5 0.5 0.8  Alkaline Phos 38 - 126 U/L 59 50 46  AST 15 - 41 U/L 19 17 17   ALT 0 - 44 U/L 38 47(H) 54(H)       DIAGNOSTIC IMAGING:  I have independently reviewed the scans and discussed with the patient.   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  Autoimmune  hemolytic anemia: -Admission to the hospital on 08/03/2019 with hemoglobin 6.3.  Direct Coombs test IgG was positive. -Prednisone 120 mg daily started on 08/05/2019. -4 doses of weekly rituximab from 08/15/2019 through 09/05/2019. -Prednisone was decreased to 20 mg on 09/19/2019. -We reviewed his labs.  Hemoglobin is 14.1 and hematocrit is 42.  Reticulocyte count is 1.5%. -I have recommended decreasing prednisone to 15 mg for the next 2 weeks.  After that he will further cut it down to 10 mg.  I will see him back in 4 weeks for follow-up with repeat labs.  2.  Leg swelling: -He was told to take Lasix 20 mg daily as needed.  3.  Diffuse large B-cell lymphoma: -Originally diagnosed in 2006, treated with R-CHOP with complete response on PET scan. -Relapse of lymphoma on 08/31/2005, treated with salvage chemotherapy followed by stem cell transplant on 01/15/2006. -He received rituximab weekly x4 followed by every 6 months for 2 years completed on 12/05/2007. -PET scan on 08/14/2019 did not show any evidence of recurrence of lymphoma.  He does not have any B symptoms.  4.  Poorly controlled diabetes: -Blood sugar today is 500.  He does not have any acidosis. -I have told him to increase his Amaryl to 2 mg  twice daily.     Orders placed this encounter:  Orders Placed This Encounter  Procedures  . CBC with Differential/Platelet  . Comprehensive metabolic panel  . Lactate dehydrogenase  . Reticulocytes  . Direct antiglobulin test      Derek Jack, MD [place] [phone]

## 2019-10-02 NOTE — Assessment & Plan Note (Signed)
1.  Autoimmune hemolytic anemia: -Admission to the hospital on 08/03/2019 with hemoglobin 6.3.  Direct Coombs test IgG was positive. -Prednisone 120 mg daily started on 08/05/2019. -4 doses of weekly rituximab from 08/15/2019 through 09/05/2019. -Prednisone was decreased to 20 mg on 09/19/2019. -We reviewed his labs.  Hemoglobin is 14.1 and hematocrit is 42.  Reticulocyte count is 1.5%. -I have recommended decreasing prednisone to 15 mg for the next 2 weeks.  After that he will further cut it down to 10 mg.  I will see him back in 4 weeks for follow-up with repeat labs.  2.  Leg swelling: -He was told to take Lasix 20 mg daily as needed.  3.  Diffuse large B-cell lymphoma: -Originally diagnosed in 2006, treated with R-CHOP with complete response on PET scan. -Relapse of lymphoma on 08/31/2005, treated with salvage chemotherapy followed by stem cell transplant on 01/15/2006. -He received rituximab weekly x4 followed by every 6 months for 2 years completed on 12/05/2007. -PET scan on 08/14/2019 did not show any evidence of recurrence of lymphoma.  He does not have any B symptoms.  4.  Poorly controlled diabetes: -Blood sugar today is 500.  He does not have any acidosis. -I have told him to increase his Amaryl to 2 mg twice daily.

## 2019-10-04 ENCOUNTER — Telehealth (INDEPENDENT_AMBULATORY_CARE_PROVIDER_SITE_OTHER): Payer: Medicare Other | Admitting: Cardiovascular Disease

## 2019-10-04 ENCOUNTER — Encounter: Payer: Self-pay | Admitting: Cardiovascular Disease

## 2019-10-04 VITALS — BP 137/69 | HR 87 | Temp 97.1°F | Ht 71.0 in | Wt 268.0 lb

## 2019-10-04 DIAGNOSIS — I4892 Unspecified atrial flutter: Secondary | ICD-10-CM

## 2019-10-04 DIAGNOSIS — Z7901 Long term (current) use of anticoagulants: Secondary | ICD-10-CM

## 2019-10-04 DIAGNOSIS — I10 Essential (primary) hypertension: Secondary | ICD-10-CM

## 2019-10-04 DIAGNOSIS — I5032 Chronic diastolic (congestive) heart failure: Secondary | ICD-10-CM | POA: Diagnosis not present

## 2019-10-04 DIAGNOSIS — I110 Hypertensive heart disease with heart failure: Secondary | ICD-10-CM

## 2019-10-04 NOTE — Patient Instructions (Signed)

## 2019-10-04 NOTE — Progress Notes (Signed)
Virtual Visit via Telephone Note   This visit type was conducted due to national recommendations for restrictions regarding the COVID-19 Pandemic (e.g. social distancing) in an effort to limit this patient's exposure and mitigate transmission in our community.  Due to his co-morbid illnesses, this patient is at least at moderate risk for complications without adequate follow up.  This format is felt to be most appropriate for this patient at this time.  The patient did not have access to video technology/had technical difficulties with video requiring transitioning to audio format only (telephone).  All issues noted in this document were discussed and addressed.  No physical exam could be performed with this format.  Please refer to the patient's chart for his  consent to telehealth for [facility].   Date:  10/04/2019   ID:  [name], DOB 1951-08-20, MRN [id number]  Patient Location: Home Provider Location: Home  PCP:  Redmond School, MD  Cardiologist:  Kate Sable, MD  Electrophysiologist:  None   Evaluation Performed:  Follow-Up Visit  Chief Complaint:  Atrial flutter  History of Present Illness:    [name] is a 69 y.o. male with atrial flutter and chronic diastolic heart failure. He also has a history of diffuse large B-cell lymphoma status post bone marrow transplant and autoimmune hemolytic anemia.  He is on prednisone which gives him dry mouth.  He denies palpitations and chest pain.   He has exertional dyspnea which is stable and may have improved.  Past Medical History:  Diagnosis Date  . Asthma    as child  . Diabetes mellitus without complication ([place])   . DLBCL (diffuse large B cell lymphoma) ([place]) 02/28/2009  . Edema   . Heart failure, diastolic, chronic ([place])    patient denies  . Hyperlipidemia, mixed   . Hypertension   . IDA (iron deficiency anemia)   . Large cell lymphoma ([place]) 01/2005   autologous stem cell transplant 12/2005  . Obesity,  morbid (more than 100 lbs over ideal weight or BMI > 40) ([facility])   . Venous insufficiency 04/29/2011   Past Surgical History:  Procedure Laterality Date  . BACK SURGERY  2006   T6 vertebrae removed/titanium placed  . CATARACT EXTRACTION W/PHACO Right 03/31/2019   Procedure: CATARACT EXTRACTION PHACO AND INTRAOCULAR LENS PLACEMENT (IOC);  Surgeon: Baruch Goldmann, MD;  Location: [facility];  Service: Ophthalmology;  Laterality: Right;  CDE: 3.21  . CATARACT EXTRACTION W/PHACO Left 04/14/2019   Procedure: CATARACT EXTRACTION PHACO AND INTRAOCULAR LENS PLACEMENT (IOC);  Surgeon: Baruch Goldmann, MD;  Location: [facility];  Service: Ophthalmology;  Laterality: Left;  left - pt knows to arrive at 7:45, CDE: 3.55  . COLONOSCOPY  11/24/2004   Polyps in the left colon ablated/removed as described above.  Two submucosal lesions consistent with lipomas as described above not  manipulated./ Normal rectum  . COLONOSCOPY  06/20/2012   MULTIPLE RECTAL AND COLONIC POLYPS  . COLONOSCOPY N/A 03/08/2015   RMR: Capacious, redundant colon. Multiple colonic and rectosigmoid polyps removed. ablated as described above. colonic lipoma abnormal appearing terminal ileum likely a variant of normal). Howeverwith history  biopsies obtained.   . ESOPHAGOGASTRODUODENOSCOPY N/A 03/08/2015   RMR: Hiatal hernia Polypoid gastric mucosa with multiple gastric polyps. largest polyp removed via snare polypectomgy hemostasis clip placed at base. Status post gastric biopsy. Status post video capsule placement.   Marland Kitchen GIVENS CAPSULE STUDY N/A 03/08/2015   Procedure: GIVENS CAPSULE STUDY;  Surgeon: Daneil Dolin, MD;  [facility];  Service: Endoscopy;  Laterality: N/A;  . LIMBAL STEM CELL TRANSPLANT    . LUNG BIOPSY  6/06  . MULTIPLE TOOTH EXTRACTIONS  04/2005  . port a cath placement    . PORT-A-CATH REMOVAL  09/08/2012   Procedure: REMOVAL PORT-A-CATH;  Surgeon: Melrose Nakayama, MD;  Location: [facility];  Service: Thoracic;  Laterality: N/A;      Current Meds  Medication Sig  . apixaban (ELIQUIS) 5 MG TABS tablet Take 1 tablet (5 mg total) by mouth 2 (two) times daily.  . Blood Glucose Monitoring Suppl (ONE TOUCH ULTRA 2) w/Device KIT USE TO TEST BLOOD SUGAR ONCE D UTD  . clotrimazole-betamethasone (LOTRISONE) cream Apply 1 application topically 2 (two) times daily as needed (leg infection).   . folic acid (FOLVITE) 1 MG tablet Take 1 tablet (1 mg total) by mouth daily.  . furosemide (LASIX) 20 MG tablet Take 20 mg by mouth daily as needed for fluid or edema.   Marland Kitchen glimepiride (AMARYL) 1 MG tablet Take 1 tablet (1 mg total) by mouth 2 (two) times daily with a meal. take 1 tablet by mouth twice daily WITH BREAKFAST  . Lancets (ONETOUCH DELICA PLUS [id number]) MISC USE TO TEST BLOOD SUGAR ONCE D  . ONETOUCH ULTRA test strip USE TO TEST BLOOD SUGAR ONCE D UTD  . pantoprazole (PROTONIX) 40 MG tablet TAKE 1 TABLET(40 MG) BY MOUTH DAILY  . predniSONE (DELTASONE) 10 MG tablet Take as directed.  . riTUXimab in sodium chloride 0.9 % 250 mL Inject into the vein once a week. Weekly x 4 weeks  . rosuvastatin (CRESTOR) 40 MG tablet Take 40 mg by mouth daily.     Allergies:   Penicillins   Social History   Tobacco Use  . Smoking status: Former Smoker    Packs/day: 0.50    Years: 15.00    Pack years: 7.50    Quit date: 11/11/2002    Years since quitting: 16.9  . Smokeless tobacco: Never Used  Substance Use Topics  . Alcohol use: No    Alcohol/week: 0.0 standard drinks  . Drug use: No     Family Hx: The patient's family history includes Cancer in his father and mother. There is no history of Colon cancer or Diabetes.  ROS:   Please see the history of present illness.     All other systems reviewed and are negative.   Prior CV studies:   The following studies were reviewed today:  Nuclear stress test 03/27/15:   There was no ST segment deviation noted during stress.  Defect 1: There is a small defect of moderate severity  present in the basal inferior location. Due to soft tissue attenuation artifact.  This is a low risk study.  The left ventricular ejection fraction is normal (55-65%).    Echo 09/07/17:  - Procedure narrative: Transthoracic echocardiography. Image  quality was adequate. Intravenous contrast (Definity) was  administered.  - Left ventricle: The cavity size was normal. Wall thickness was  increased in a pattern of mild LVH. Systolic function was normal.  The estimated ejection fraction was in the range of 60% to 65%.  Wall motion was normal; there were no regional wall motion  abnormalities. The study is not technically sufficient to allow  evaluation of LV diastolic function due to atrial flutter.  - Left atrium: The atrium was mildly dilated.  - Systemic veins: IVC is dilated with normal respiratory variation.  Estimated CVP 8 mmHg.  Labs/Other Tests and Data Reviewed:    EKG:  08/02/19: Rate controlled atrial flutter with PVC's  Recent Labs: 08/02/2019: B Natriuretic Peptide 209.0 08/05/2019: Magnesium 2.2 10/02/2019: ALT 38; BUN 15; Creatinine, Ser 1.13; Hemoglobin 14.1; Platelets 164; Potassium 4.5; Sodium 135   Recent Lipid Panel No results found for: CHOL, TRIG, HDL, CHOLHDL, LDLCALC, LDLDIRECT  Wt Readings from Last 3 Encounters:  10/04/19 268 lb (121.6 kg)  10/02/19 268 lb 3.2 oz (121.7 kg)  09/19/19 273 lb 6.4 oz (124 kg)     Objective:    Vital Signs:  BP 137/69   Pulse 87   Temp (!) 97.1 F (36.2 C)   Ht _0  (1.803 m)   Wt 268 lb (121.6 kg)   SpO2 96%   BMI 37.38 kg/m    VITAL SIGNS:  reviewed  ASSESSMENT & PLAN:    1.  Atrial flutter: Heart rate is controlled on metoprolol 25 mg bid. Symptoms are stable. Systemically anticoagulated with Eliquis 5 mg twice daily.  2.  Hypertension: Blood pressure is reasonably controlled.  No change to therapy.  3.  Chronic diastolic heart failure: Blood pressure is reasonably controlled.  He takes  Lasix 20 mg as needed.    COVID-19 Education: The signs and symptoms of COVID-19 were discussed with the patient and how to seek care for testing (follow up with PCP or arrange E-visit).  The importance of social distancing was discussed today.  Time:   Today, I have spent 15 minutes with the patient with telehealth technology discussing the above problems.     Medication Adjustments/Labs and Tests Ordered: Current medicines are reviewed at length with the patient today.  Concerns regarding medicines are outlined above.   Tests Ordered: No orders of the defined types were placed in this encounter.   Medication Changes: No orders of the defined types were placed in this encounter.   Follow Up:  In Person in 1 year(s)  Signed, Kate Sable, MD  10/04/2019 9:43 AM    [place]

## 2019-10-22 DIAGNOSIS — E7849 Other hyperlipidemia: Secondary | ICD-10-CM | POA: Diagnosis not present

## 2019-10-22 DIAGNOSIS — I4891 Unspecified atrial fibrillation: Secondary | ICD-10-CM | POA: Diagnosis not present

## 2019-10-22 DIAGNOSIS — E1129 Type 2 diabetes mellitus with other diabetic kidney complication: Secondary | ICD-10-CM | POA: Diagnosis not present

## 2019-10-22 DIAGNOSIS — I2510 Atherosclerotic heart disease of native coronary artery without angina pectoris: Secondary | ICD-10-CM | POA: Diagnosis not present

## 2019-10-24 ENCOUNTER — Inpatient Hospital Stay (HOSPITAL_COMMUNITY): Payer: Medicare Other | Attending: Hematology

## 2019-10-24 ENCOUNTER — Other Ambulatory Visit: Payer: Self-pay

## 2019-10-24 ENCOUNTER — Other Ambulatory Visit (HOSPITAL_COMMUNITY): Payer: Medicare Other

## 2019-10-24 DIAGNOSIS — D5919 Other autoimmune hemolytic anemia: Secondary | ICD-10-CM

## 2019-10-24 DIAGNOSIS — E1165 Type 2 diabetes mellitus with hyperglycemia: Secondary | ICD-10-CM | POA: Insufficient documentation

## 2019-10-24 DIAGNOSIS — R350 Frequency of micturition: Secondary | ICD-10-CM | POA: Insufficient documentation

## 2019-10-24 DIAGNOSIS — I5032 Chronic diastolic (congestive) heart failure: Secondary | ICD-10-CM | POA: Diagnosis not present

## 2019-10-24 DIAGNOSIS — Z8572 Personal history of non-Hodgkin lymphomas: Secondary | ICD-10-CM | POA: Insufficient documentation

## 2019-10-24 DIAGNOSIS — Z7952 Long term (current) use of systemic steroids: Secondary | ICD-10-CM | POA: Insufficient documentation

## 2019-10-24 DIAGNOSIS — D591 Autoimmune hemolytic anemia, unspecified: Secondary | ICD-10-CM | POA: Diagnosis not present

## 2019-10-24 DIAGNOSIS — M7989 Other specified soft tissue disorders: Secondary | ICD-10-CM | POA: Insufficient documentation

## 2019-10-24 DIAGNOSIS — I110 Hypertensive heart disease with heart failure: Secondary | ICD-10-CM | POA: Insufficient documentation

## 2019-10-24 LAB — COMPREHENSIVE METABOLIC PANEL
ALT: 32 U/L (ref 0–44)
AST: 22 U/L (ref 15–41)
Albumin: 3.6 g/dL (ref 3.5–5.0)
Alkaline Phosphatase: 70 U/L (ref 38–126)
Anion gap: 15 (ref 5–15)
BUN: 15 mg/dL (ref 8–23)
CO2: 23 mmol/L (ref 22–32)
Calcium: 9.2 mg/dL (ref 8.9–10.3)
Chloride: 98 mmol/L (ref 98–111)
Creatinine, Ser: 1.4 mg/dL — ABNORMAL HIGH (ref 0.61–1.24)
GFR calc Af Amer: 60 mL/min — ABNORMAL LOW (ref >60)
GFR calc non Af Amer: 52 mL/min — ABNORMAL LOW (ref >60)
Glucose, Bld: 442 mg/dL — ABNORMAL HIGH (ref 70–99)
Potassium: 4.3 mmol/L (ref 3.5–5.1)
Sodium: 136 mmol/L (ref 135–145)
Total Bilirubin: 1 mg/dL (ref 0.3–1.2)
Total Protein: 6.7 g/dL (ref 6.5–8.1)

## 2019-10-24 LAB — RETICULOCYTES
Immature Retic Fract: 20.7 % — ABNORMAL HIGH (ref 2.3–15.9)
RBC.: 4.76 MIL/uL (ref 4.22–5.81)
Retic Count, Absolute: 119.5 10*3/uL (ref 19.0–186.0)
Retic Ct Pct: 2.5 % (ref 0.4–3.1)

## 2019-10-24 LAB — CBC WITH DIFFERENTIAL/PLATELET
Abs Immature Granulocytes: 0.08 10*3/uL — ABNORMAL HIGH (ref 0.00–0.07)
Basophils Absolute: 0 10*3/uL (ref 0.0–0.1)
Basophils Relative: 1 %
Eosinophils Absolute: 0.1 10*3/uL (ref 0.0–0.5)
Eosinophils Relative: 1 %
HCT: 46 % (ref 39.0–52.0)
Hemoglobin: 15.3 g/dL (ref 13.0–17.0)
Immature Granulocytes: 1 %
Lymphocytes Relative: 8 %
Lymphs Abs: 0.6 10*3/uL — ABNORMAL LOW (ref 0.7–4.0)
MCH: 32.5 pg (ref 26.0–34.0)
MCHC: 33.3 g/dL (ref 30.0–36.0)
MCV: 97.7 fL (ref 80.0–100.0)
Monocytes Absolute: 0.7 10*3/uL (ref 0.1–1.0)
Monocytes Relative: 9 %
Neutro Abs: 6 10*3/uL (ref 1.7–7.7)
Neutrophils Relative %: 80 %
Platelets: 190 10*3/uL (ref 150–400)
RBC: 4.71 MIL/uL (ref 4.22–5.81)
RDW: 13.7 % (ref 11.5–15.5)
WBC: 7.5 10*3/uL (ref 4.0–10.5)
nRBC: 0 % (ref 0.0–0.2)

## 2019-10-24 LAB — LACTATE DEHYDROGENASE: LDH: 228 U/L — ABNORMAL HIGH (ref 98–192)

## 2019-10-27 ENCOUNTER — Ambulatory Visit: Payer: Medicare Other

## 2019-10-31 ENCOUNTER — Ambulatory Visit (HOSPITAL_COMMUNITY): Payer: Medicare Other | Admitting: Hematology

## 2019-11-03 ENCOUNTER — Ambulatory Visit: Payer: Medicare Other | Attending: Internal Medicine

## 2019-11-03 DIAGNOSIS — Z23 Encounter for immunization: Secondary | ICD-10-CM

## 2019-11-03 NOTE — Progress Notes (Signed)
   Covid-19 Vaccination Clinic  Name:  [name]    MRN: [id number] DOB: [birth year]  11/03/2019  Mr. [name] was observed post Covid-19 immunization for 15 minutes without incident. He was provided with Vaccine Information Sheet and instruction to access the V-Safe system.   Mr. [name] was instructed to call 911 with any severe reactions post vaccine: Marland Kitchen Difficulty breathing  . Swelling of face and throat  . A fast heartbeat  . A bad rash all over body  . Dizziness and weakness   Immunizations Administered    Name Date Dose VIS Date Route   Moderna COVID-19 Vaccine 11/03/2019  8:58 AM 0.5 mL 07/25/2019 Intramuscular   Manufacturer: Moderna   Lot: [id number]   [place]: [id number]

## 2019-11-21 ENCOUNTER — Encounter (HOSPITAL_COMMUNITY): Payer: Self-pay | Admitting: Hematology

## 2019-11-21 ENCOUNTER — Inpatient Hospital Stay (HOSPITAL_BASED_OUTPATIENT_CLINIC_OR_DEPARTMENT_OTHER): Payer: Medicare Other | Admitting: Hematology

## 2019-11-21 ENCOUNTER — Inpatient Hospital Stay (HOSPITAL_COMMUNITY): Payer: Medicare Other

## 2019-11-21 ENCOUNTER — Other Ambulatory Visit: Payer: Self-pay

## 2019-11-21 VITALS — BP 134/84 | HR 85 | Temp 97.3°F | Resp 18 | Wt 255.0 lb

## 2019-11-21 DIAGNOSIS — D5919 Other autoimmune hemolytic anemia: Secondary | ICD-10-CM

## 2019-11-21 DIAGNOSIS — I5032 Chronic diastolic (congestive) heart failure: Secondary | ICD-10-CM | POA: Diagnosis not present

## 2019-11-21 DIAGNOSIS — E1165 Type 2 diabetes mellitus with hyperglycemia: Secondary | ICD-10-CM | POA: Diagnosis not present

## 2019-11-21 DIAGNOSIS — D591 Autoimmune hemolytic anemia, unspecified: Secondary | ICD-10-CM | POA: Diagnosis not present

## 2019-11-21 DIAGNOSIS — Z7952 Long term (current) use of systemic steroids: Secondary | ICD-10-CM | POA: Diagnosis not present

## 2019-11-21 DIAGNOSIS — D696 Thrombocytopenia, unspecified: Secondary | ICD-10-CM | POA: Diagnosis not present

## 2019-11-21 DIAGNOSIS — I110 Hypertensive heart disease with heart failure: Secondary | ICD-10-CM | POA: Diagnosis not present

## 2019-11-21 DIAGNOSIS — M7989 Other specified soft tissue disorders: Secondary | ICD-10-CM | POA: Diagnosis not present

## 2019-11-21 DIAGNOSIS — R350 Frequency of micturition: Secondary | ICD-10-CM | POA: Diagnosis not present

## 2019-11-21 DIAGNOSIS — Z8572 Personal history of non-Hodgkin lymphomas: Secondary | ICD-10-CM | POA: Diagnosis not present

## 2019-11-21 LAB — COMPREHENSIVE METABOLIC PANEL
ALT: 27 U/L (ref 0–44)
AST: 16 U/L (ref 15–41)
Albumin: 3.6 g/dL (ref 3.5–5.0)
Alkaline Phosphatase: 64 U/L (ref 38–126)
Anion gap: 11 (ref 5–15)
BUN: 17 mg/dL (ref 8–23)
CO2: 27 mmol/L (ref 22–32)
Calcium: 9.6 mg/dL (ref 8.9–10.3)
Chloride: 97 mmol/L — ABNORMAL LOW (ref 98–111)
Creatinine, Ser: 1.06 mg/dL (ref 0.61–1.24)
GFR calc Af Amer: >60 mL/min (ref >60)
GFR calc non Af Amer: >60 mL/min (ref >60)
Glucose, Bld: 487 mg/dL — ABNORMAL HIGH (ref 70–99)
Potassium: 4.4 mmol/L (ref 3.5–5.1)
Sodium: 135 mmol/L (ref 135–145)
Total Bilirubin: 0.8 mg/dL (ref 0.3–1.2)
Total Protein: 6.4 g/dL — ABNORMAL LOW (ref 6.5–8.1)

## 2019-11-21 LAB — CBC WITH DIFFERENTIAL/PLATELET
Abs Immature Granulocytes: 0.05 10*3/uL (ref 0.00–0.07)
Basophils Absolute: 0 10*3/uL (ref 0.0–0.1)
Basophils Relative: 0 %
Eosinophils Absolute: 0.1 10*3/uL (ref 0.0–0.5)
Eosinophils Relative: 1 %
HCT: 43.5 % (ref 39.0–52.0)
Hemoglobin: 14.2 g/dL (ref 13.0–17.0)
Immature Granulocytes: 1 %
Lymphocytes Relative: 12 %
Lymphs Abs: 0.6 10*3/uL — ABNORMAL LOW (ref 0.7–4.0)
MCH: 31.9 pg (ref 26.0–34.0)
MCHC: 32.6 g/dL (ref 30.0–36.0)
MCV: 97.8 fL (ref 80.0–100.0)
Monocytes Absolute: 0.6 10*3/uL (ref 0.1–1.0)
Monocytes Relative: 12 %
Neutro Abs: 3.8 10*3/uL (ref 1.7–7.7)
Neutrophils Relative %: 74 %
Platelets: 198 10*3/uL (ref 150–400)
RBC: 4.45 MIL/uL (ref 4.22–5.81)
RDW: 15.1 % (ref 11.5–15.5)
WBC: 5.1 10*3/uL (ref 4.0–10.5)
nRBC: 0.4 % — ABNORMAL HIGH (ref 0.0–0.2)

## 2019-11-21 LAB — RETICULOCYTES
Immature Retic Fract: 29.9 % — ABNORMAL HIGH (ref 2.3–15.9)
RBC.: 4.48 MIL/uL (ref 4.22–5.81)
Retic Count, Absolute: 151.4 10*3/uL (ref 19.0–186.0)
Retic Ct Pct: 3.4 % — ABNORMAL HIGH (ref 0.4–3.1)

## 2019-11-21 LAB — LACTATE DEHYDROGENASE: LDH: 197 U/L — ABNORMAL HIGH (ref 98–192)

## 2019-11-21 MED ORDER — PREDNISONE 10 MG PO TABS: ORAL_TABLET | ORAL | 0 refills | Status: DC

## 2019-11-21 NOTE — Progress Notes (Signed)
[address], [place] [address]   CLINIC:  Medical Oncology/Hematology  PCP:  [facility], [place] [address] [phone]   REASON FOR VISIT:  Follow-up for large B-cell lymphoma and newly diagnosed autoimmune hemolytic anemia.  CURRENT THERAPY: Weekly rituximab and prednisone.  BRIEF ONCOLOGIC HISTORY:  Oncology History Overview Note  S/P chemotherapy following stabilization of the spine.  Initial biopsy was on 07/13/2005 on his bone marrow.  Thoracic vertebra was biopsied on 02/04/2005 and mediastinum on 02/03/2005.  S/P R-CHOP and achieved an incomplete response, then underwent ICE chemotherapy pre-transplant and total body radiation with autologous stem cell transplant on 01/15/2006 and maintained on Rituxan for 2 years.  Thus far without recurrence.   DLBCL (diffuse large B cell lymphoma) ([place])  01/27/2005 Imaging   PET- diffuse activity  involving the L cervical area, extensive activity in the mediastinumn particularly in the R paratracheal area and to a lesser degree in the anterior mediastinum.  Lg area in porta caval nodes in upper abd and diffuse bone activity   02/03/2005 Pathology Results   Mediastinal lymph node demonstrating diffuse large cell lymphoma, B-type.  Vertebral bone biopsy demonstrating diffuse large b-cell lymphoma   03/11/2005 - 08/04/2005 Chemotherapy   R-CHOP x 8 cycles   05/04/2005 Imaging   PET- near resolution of previously identified uptake.   07/13/2005 Bone Marrow Biopsy   Bone marrow aspiration and biopsy is negative for lymphoma   08/31/2005 Imaging   PET- further response to therapy with findings consistent with residual disease.  Hypermetabolic activity identified within the anterior mediastinal lymph node (smaller but still pathologic).  Borderline hypermetabolic small right pleural effusion   10/20/2005 - 11/13/2005 Chemotherapy   R-ICE x 2 [facility] at [facility]   01/08/2006 -  01/09/2006 Chemotherapy   Cyclophosphamide 600 mg daily x 2   01/10/2006 - 01/14/2006 Radiation Therapy   Total body radiation twice daily   01/15/2006 Bone Marrow Transplant   Stem cell transplant at [facility]   04/05/2006 - 12/05/2007 Chemotherapy   Rituxan weekly x 4 every 6 months x 2 years      CANCER STAGING: Cancer Staging DLBCL (diffuse large B cell lymphoma) (HCC) Staging form: Lymphoid Neoplasms, AJCC 6th Edition - Clinical: Stage IV - Signed by Baird Cancer, PA on 04/29/2011    INTERVAL HISTORY:  Mr. Arnett 69 y.o. male seen for follow-up of autoimmune hemolytic anemia and a large B-cell lymphoma.  Reports increase the frequency of urination.  Denies any dysuria.  Currently taking prednisone 10 mg daily.  Denies any fevers, night sweats or weight loss in the last 6 months.  Appetite is 50%.  Energy levels are 25%.  REVIEW OF SYSTEMS:  Review of Systems  Genitourinary: Positive for frequency.   All other systems reviewed and are negative.    PAST MEDICAL/SURGICAL HISTORY:  Past Medical History:  Diagnosis Date  . Asthma    as child  . Diabetes mellitus without complication ([place])   . DLBCL (diffuse large B cell lymphoma) ([place]) 02/28/2009  . Edema   . Heart failure, diastolic, chronic ([place])    patient denies  . Hyperlipidemia, mixed   . Hypertension   . IDA (iron deficiency anemia)   . Large cell lymphoma ([place]) 01/2005   autologous stem cell transplant 12/2005  . Obesity, morbid (more than 100 lbs over ideal weight or BMI > 40) (HCC)   . Venous insufficiency 04/29/2011   Past Surgical History:  Procedure Laterality Date  . BACK SURGERY  2006   T6 vertebrae removed/titanium placed  . CATARACT EXTRACTION W/PHACO Right 03/31/2019   Procedure: CATARACT EXTRACTION PHACO AND INTRAOCULAR LENS PLACEMENT (IOC);  Surgeon: Baruch Goldmann, MD;  Location: [facility];  Service: Ophthalmology;  Laterality: Right;  CDE: 3.21  . CATARACT EXTRACTION W/PHACO Left 04/14/2019   Procedure:  CATARACT EXTRACTION PHACO AND INTRAOCULAR LENS PLACEMENT (IOC);  Surgeon: Baruch Goldmann, MD;  Location: [facility];  Service: Ophthalmology;  Laterality: Left;  left - pt knows to arrive at 7:45, CDE: 3.55  . COLONOSCOPY  11/24/2004   Polyps in the left colon ablated/removed as described above.  Two submucosal lesions consistent with lipomas as described above not  manipulated./ Normal rectum  . COLONOSCOPY  06/20/2012   MULTIPLE RECTAL AND COLONIC POLYPS  . COLONOSCOPY N/A 03/08/2015   RMR: Capacious, redundant colon. Multiple colonic and rectosigmoid polyps removed. ablated as described above. colonic lipoma abnormal appearing terminal ileum likely a variant of normal). Howeverwith history  biopsies obtained.   . ESOPHAGOGASTRODUODENOSCOPY N/A 03/08/2015   RMR: Hiatal hernia Polypoid gastric mucosa with multiple gastric polyps. largest polyp removed via snare polypectomgy hemostasis clip placed at base. Status post gastric biopsy. Status post video capsule placement.   Marland Kitchen GIVENS CAPSULE STUDY N/A 03/08/2015   Procedure: GIVENS CAPSULE STUDY;  Surgeon: Daneil Dolin, MD;  Location: [facility];  Service: Endoscopy;  Laterality: N/A;  . LIMBAL STEM CELL TRANSPLANT    . LUNG BIOPSY  6/06  . MULTIPLE TOOTH EXTRACTIONS  04/2005  . port a cath placement    . PORT-A-CATH REMOVAL  09/08/2012   Procedure: REMOVAL PORT-A-CATH;  Surgeon: Melrose Nakayama, MD;  Location: [place];  Service: Thoracic;  Laterality: N/A;     SOCIAL HISTORY:  Social History   Socioeconomic History  . Marital status: Married    Spouse name: Not on file  . Number of children: 2  . Years of education: Not on file  . Highest education level: Not on file  Occupational History  . Occupation: disability due to back    Employer: UNEMPLOYED  Tobacco Use  . Smoking status: Former Smoker    Packs/day: 0.50    Years: 15.00    Pack years: 7.50    Quit date: 11/11/2002    Years since quitting: 17.0  . Smokeless tobacco: Never  Used  Substance and Sexual Activity  . Alcohol use: No    Alcohol/week: 0.0 standard drinks  . Drug use: No  . Sexual activity: Yes    Birth control/protection: None  Other Topics Concern  . Not on file  Social History Narrative   Married   No regular exercise   Social Determinants of Health   Financial Resource Strain:   . Difficulty of Paying Living Expenses:   Food Insecurity:   . Worried About Charity fundraiser in the Last Year:   . Arboriculturist in the Last Year:   Transportation Needs:   . Film/video editor (Medical):   Marland Kitchen Lack of Transportation (Non-Medical):   Physical Activity:   . Days of Exercise per Week:   . Minutes of Exercise per Session:   Stress:   . Feeling of Stress :   Social Connections:   . Frequency of Communication with Friends and Family:   . Frequency of Social Gatherings with Friends and Family:   . Attends Religious Services:   . Active Member of Clubs or Organizations:   .  Attends Archivist Meetings:   Marland Kitchen Marital Status:   Intimate Partner Violence:   . Fear of Current or Ex-Partner:   . Emotionally Abused:   Marland Kitchen Physically Abused:   . Sexually Abused:     FAMILY HISTORY:  Family History  Problem Relation Age of Onset  . Cancer Mother        lung  . Cancer Father        prostate  . Colon cancer Neg Hx   . Diabetes Neg Hx     CURRENT MEDICATIONS:  Outpatient Encounter Medications as of 11/21/2019  Medication Sig  . apixaban (ELIQUIS) 5 MG TABS tablet Take 1 tablet (5 mg total) by mouth 2 (two) times daily.  . Blood Glucose Monitoring Suppl (ONE TOUCH ULTRA 2) w/Device KIT USE TO TEST BLOOD SUGAR ONCE D UTD  . folic acid (FOLVITE) 1 MG tablet Take 1 tablet (1 mg total) by mouth daily.  Marland Kitchen glimepiride (AMARYL) 1 MG tablet Take 1 tablet (1 mg total) by mouth 2 (two) times daily with a meal. take 1 tablet by mouth twice daily WITH BREAKFAST  . Lancets (ONETOUCH DELICA PLUS [id number]) MISC USE TO TEST BLOOD SUGAR ONCE D    . metoprolol tartrate (LOPRESSOR) 25 MG tablet Take 1 tablet (25 mg total) by mouth 2 (two) times daily.  Glory Rosebush ULTRA test strip USE TO TEST BLOOD SUGAR ONCE D UTD  . pantoprazole (PROTONIX) 40 MG tablet TAKE 1 TABLET(40 MG) BY MOUTH DAILY  . predniSONE (DELTASONE) 10 MG tablet Take as directed.  . riTUXimab in sodium chloride 0.9 % 250 mL Inject into the vein once a week. Weekly x 4 weeks  . rosuvastatin (CRESTOR) 40 MG tablet Take 40 mg by mouth daily.  . [DISCONTINUED] predniSONE (DELTASONE) 10 MG tablet Take as directed.  . clotrimazole-betamethasone (LOTRISONE) cream Apply 1 application topically 2 (two) times daily as needed (leg infection).   . furosemide (LASIX) 20 MG tablet Take 20 mg by mouth daily as needed for fluid or edema.    No facility-administered encounter medications on file as of 11/21/2019.    ALLERGIES:  Allergies  Allergen Reactions  . Penicillins Rash    Has patient had a PCN reaction causing immediate rash, facial/tongue/throat swelling, SOB or lightheadedness with hypotension: Unknown Has patient had a PCN reaction causing severe rash involving mucus membranes or skin necrosis: Unknown Has patient had a PCN reaction that required hospitalization: Unknown Has patient had a PCN reaction occurring within the last 10 years: No If all of the above answers are "NO", then may proceed with Cephalosporin use.      PHYSICAL EXAM:  ECOG Performance status: 1  Vitals:   11/21/19 1155  BP: 134/84  Pulse: 85  Resp: 18  Temp: (!) 97.3 F (36.3 C)  SpO2: 97%   Filed Weights   11/21/19 1155  Weight: 255 lb (115.7 kg)    Physical Exam Vitals reviewed.  Constitutional:      Appearance: Normal appearance.  Cardiovascular:     Rate and Rhythm: Normal rate and regular rhythm.     Heart sounds: Normal heart sounds.  Pulmonary:     Effort: Pulmonary effort is normal.     Breath sounds: Normal breath sounds.  Abdominal:     General: There is no  distension.     Palpations: Abdomen is soft. There is no mass.  Lymphadenopathy:     Cervical: No cervical adenopathy.  Skin:    General: Skin  is warm.  Neurological:     General: No focal deficit present.     Mental Status: He is alert and oriented to person, place, and time.  Psychiatric:        Mood and Affect: Mood normal.        Behavior: Behavior normal.      LABORATORY DATA:  I have reviewed the labs as listed.  CBC    Component Value Date/Time   WBC 5.1 11/21/2019 1302   RBC 4.45 11/21/2019 1302   RBC 4.48 11/21/2019 1302   HGB 14.2 11/21/2019 1302   HCT 43.5 11/21/2019 1302   PLT 198 11/21/2019 1302   MCV 97.8 11/21/2019 1302   MCH 31.9 11/21/2019 1302   MCHC 32.6 11/21/2019 1302   RDW 15.1 11/21/2019 1302   LYMPHSABS 0.6 (L) 11/21/2019 1302   MONOABS 0.6 11/21/2019 1302   EOSABS 0.1 11/21/2019 1302   BASOSABS 0.0 11/21/2019 1302   CMP Latest Ref Rng & Units 11/21/2019 10/24/2019 10/02/2019  Glucose 70 - 99 mg/dL 487(H) 442(H) 525(HH)  BUN 8 - 23 mg/dL '17 15 15  '$ Creatinine 0.61 - 1.24 mg/dL 1.06 1.40(H) 1.13  Sodium 135 - 145 mmol/L 135 136 135  Potassium 3.5 - 5.1 mmol/L 4.4 4.3 4.5  Chloride 98 - 111 mmol/L 97(L) 98 100  CO2 22 - 32 mmol/L '27 23 25  '$ Calcium 8.9 - 10.3 mg/dL 9.6 9.2 9.2  Total Protein 6.5 - 8.1 g/dL 6.4(L) 6.7 6.4(L)  Total Bilirubin 0.3 - 1.2 mg/dL 0.8 1.0 0.5  Alkaline Phos 38 - 126 U/L 64 70 59  AST 15 - 41 U/L '16 22 19  '$ ALT 0 - 44 U/L 27 32 38       DIAGNOSTIC IMAGING:  I have reviewed his scans.   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  Autoimmune hemolytic anemia: -Prednisone 120 mg daily started on 08/05/2019, rituximab 4 doses from 08/15/2019 through 09/05/2019. -Patient currently on tapered doses of prednisone.  He is taking 10 mg daily. -We reviewed his labs today.  Hemoglobin is 14.2.  White count and platelets are normal.  LFTs are normal.  Reticulocyte count is 3%.  LDH is mildly elevated at 197  but improved. -I plan to cut his prednisone to 5 mg daily for 2 weeks followed by 5 mg every other day for the next 2 weeks. -I plan to see him back in 4 weeks.  I have sent a new prescription for prednisone 10 mg tablets #30.  2.  Leg swelling: -He will use Lasix 20 mg daily as needed.  3.  Poorly controlled diabetes: -Glucose today is 487.  He is taking Amaryl 2 mg twice daily. -He reports increased urination.  Hopefully this will improve as I am decreasing prednisone and tapered off over the next 4 weeks.  4.  DLBCL: -Diagnosed in 2006, treated with R-CHOP with complete response on PET scan. -Relapse of lymphoma in 08/31/2005, treated with salvage chemotherapy followed by stem cell transplant on 01/15/2006. -Rituximab weekly x4 followed by every 6 months for 2 years completed on 12/05/2007. -PET scan on 08/14/2019 did not show any evidence of recurrence of lymphoma.  No B symptoms at this time.      Orders placed this encounter:  Orders Placed This Encounter  Procedures  . CBC with Differential  . Reticulocytes  . Lactate dehydrogenase  . Comprehensive metabolic panel  . Hemoglobin A1c      Derek Jack, MD  [place] [phone]

## 2019-11-21 NOTE — Assessment & Plan Note (Signed)
1.  Autoimmune hemolytic anemia: -Prednisone 120 mg daily started on 08/05/2019, rituximab 4 doses from 08/15/2019 through 09/05/2019. -Patient currently on tapered doses of prednisone.  He is taking 10 mg daily. -We reviewed his labs today.  Hemoglobin is 14.2.  White count and platelets are normal.  LFTs are normal.  Reticulocyte count is 3%.  LDH is mildly elevated at 197 but improved. -I plan to cut his prednisone to 5 mg daily for 2 weeks followed by 5 mg every other day for the next 2 weeks. -I plan to see him back in 4 weeks.  I have sent a new prescription for prednisone 10 mg tablets #30.  2.  Leg swelling: -He will use Lasix 20 mg daily as needed.  3.  Poorly controlled diabetes: -Glucose today is 487.  He is taking Amaryl 2 mg twice daily. -He reports increased urination.  Hopefully this will improve as I am decreasing prednisone and tapered off over the next 4 weeks.  4.  DLBCL: -Diagnosed in 2006, treated with R-CHOP with complete response on PET scan. -Relapse of lymphoma in 08/31/2005, treated with salvage chemotherapy followed by stem cell transplant on 01/15/2006. -Rituximab weekly x4 followed by every 6 months for 2 years completed on 12/05/2007. -PET scan on 08/14/2019 did not show any evidence of recurrence of lymphoma.  No B symptoms at this time.

## 2019-11-22 ENCOUNTER — Telehealth (HOSPITAL_COMMUNITY): Payer: Self-pay | Admitting: *Deleted

## 2019-11-22 DIAGNOSIS — E1165 Type 2 diabetes mellitus with hyperglycemia: Secondary | ICD-10-CM | POA: Diagnosis not present

## 2019-11-22 DIAGNOSIS — I2510 Atherosclerotic heart disease of native coronary artery without angina pectoris: Secondary | ICD-10-CM | POA: Diagnosis not present

## 2019-11-22 DIAGNOSIS — E7849 Other hyperlipidemia: Secondary | ICD-10-CM | POA: Diagnosis not present

## 2019-11-22 DIAGNOSIS — Z720 Tobacco use: Secondary | ICD-10-CM | POA: Diagnosis not present

## 2019-11-22 LAB — HEMOGLOBIN A1C
Hgb A1c MFr Bld: >15.5 % — ABNORMAL HIGH (ref 4.8–5.6)
Mean Plasma Glucose: >398 mg/dL

## 2019-11-23 ENCOUNTER — Encounter (HOSPITAL_COMMUNITY): Payer: Self-pay | Admitting: *Deleted

## 2019-11-23 NOTE — Progress Notes (Signed)
Contacted patient today per Dr. Delton Coombes and advised him of his recent lab work. I advised that we will be referring him to endocrinologist and their office will call him with the appointment.  He asked questions regarding his diabetes and what causes it to be uncontrolled.  All his questions were answered to his satisfaction.

## 2019-11-28 ENCOUNTER — Other Ambulatory Visit (HOSPITAL_COMMUNITY): Payer: Self-pay

## 2019-11-28 DIAGNOSIS — R599 Enlarged lymph nodes, unspecified: Secondary | ICD-10-CM

## 2019-11-28 DIAGNOSIS — D696 Thrombocytopenia, unspecified: Secondary | ICD-10-CM

## 2019-11-28 DIAGNOSIS — C8330 Diffuse large B-cell lymphoma, unspecified site: Secondary | ICD-10-CM

## 2019-11-28 DIAGNOSIS — D5919 Other autoimmune hemolytic anemia: Secondary | ICD-10-CM

## 2019-11-28 DIAGNOSIS — D509 Iron deficiency anemia, unspecified: Secondary | ICD-10-CM

## 2019-11-28 MED ORDER — PREDNISONE 10 MG PO TABS: ORAL_TABLET | ORAL | 0 refills | Status: DC

## 2019-12-02 ENCOUNTER — Other Ambulatory Visit (HOSPITAL_COMMUNITY): Payer: Self-pay | Admitting: Hematology

## 2019-12-05 ENCOUNTER — Ambulatory Visit: Payer: Medicare Other | Attending: Internal Medicine

## 2019-12-05 DIAGNOSIS — Z23 Encounter for immunization: Secondary | ICD-10-CM

## 2019-12-05 NOTE — Progress Notes (Signed)
   Covid-19 Vaccination Clinic  Name:  BERGEN MAGNER    MRN: [id number] DOB: May 11, 1951  12/05/2019  Mr. Robarge was observed post Covid-19 immunization for 15 minutes without incident. He was provided with Vaccine Information Sheet and instruction to access the V-Safe system.   Mr. Eichhorst was instructed to call 911 with any severe reactions post vaccine: Marland Kitchen Difficulty breathing  . Swelling of face and throat  . A fast heartbeat  . A bad rash all over body  . Dizziness and weakness   Immunizations Administered    Name Date Dose VIS Date Route   Moderna COVID-19 Vaccine 12/05/2019 10:24 AM 0.5 mL 07/25/2019 Intramuscular   Manufacturer: Moderna   Lot: [id number]   [place]: [id number]

## 2019-12-06 ENCOUNTER — Other Ambulatory Visit (HOSPITAL_COMMUNITY): Payer: Self-pay | Admitting: Hematology

## 2019-12-14 ENCOUNTER — Encounter: Payer: Medicare Other | Attending: Internal Medicine | Admitting: Nutrition

## 2019-12-14 ENCOUNTER — Encounter: Payer: Self-pay | Admitting: "Endocrinology

## 2019-12-14 ENCOUNTER — Encounter: Payer: Self-pay | Admitting: Nutrition

## 2019-12-14 ENCOUNTER — Other Ambulatory Visit: Payer: Self-pay

## 2019-12-14 ENCOUNTER — Ambulatory Visit (INDEPENDENT_AMBULATORY_CARE_PROVIDER_SITE_OTHER): Payer: Medicare Other | Admitting: "Endocrinology

## 2019-12-14 VITALS — BP 139/83 | HR 83 | Ht 71.0 in | Wt 259.0 lb

## 2019-12-14 DIAGNOSIS — E782 Mixed hyperlipidemia: Secondary | ICD-10-CM | POA: Diagnosis not present

## 2019-12-14 DIAGNOSIS — I10 Essential (primary) hypertension: Secondary | ICD-10-CM

## 2019-12-14 DIAGNOSIS — L03119 Cellulitis of unspecified part of limb: Secondary | ICD-10-CM | POA: Insufficient documentation

## 2019-12-14 DIAGNOSIS — Z6836 Body mass index (BMI) 36.0-36.9, adult: Secondary | ICD-10-CM

## 2019-12-14 DIAGNOSIS — E66813 Obesity, class 3: Secondary | ICD-10-CM

## 2019-12-14 DIAGNOSIS — E1165 Type 2 diabetes mellitus with hyperglycemia: Secondary | ICD-10-CM

## 2019-12-14 MED ORDER — BD PEN NEEDLE SHORT U/F 31G X 8 MM MISC: 1.0000 | 3 refills | Status: DC

## 2019-12-14 MED ORDER — METFORMIN HCL 500 MG PO TABS: 500.0000 mg | ORAL_TABLET | Freq: Two times a day (BID) | ORAL | 2 refills | Status: DC

## 2019-12-14 MED ORDER — TRESIBA FLEXTOUCH 100 UNIT/ML ~~LOC~~ SOPN: 20.0000 [IU] | PEN_INJECTOR | Freq: Every day | SUBCUTANEOUS | 2 refills | Status: DC

## 2019-12-14 MED ORDER — ONETOUCH ULTRA VI STRP: ORAL_STRIP | 2 refills | Status: DC

## 2019-12-14 MED ORDER — SULFAMETHOXAZOLE-TRIMETHOPRIM 400-80 MG PO TABS: 1.0000 | ORAL_TABLET | Freq: Two times a day (BID) | ORAL | 0 refills | Status: DC

## 2019-12-14 NOTE — Progress Notes (Signed)
Endocrinology Consult Note       12/14/2019, 9:27 AM   Subjective:    Patient ID: [name], male    DOB: 68-Feb-1952.  [name] is being seen in consultation for management of currently uncontrolled symptomatic diabetes requested by  Redmond School, MD.   Past Medical History:  Diagnosis Date  . Asthma    as child  . Diabetes mellitus without complication ([name])   . DLBCL (diffuse large B cell lymphoma) ([name]) 02/28/2009  . Edema   . Heart failure, diastolic, chronic ([name])    patient denies  . Hyperlipidemia, mixed   . Hypertension   . IDA (iron deficiency anemia)   . Large cell lymphoma ([name]) 01/2005   autologous stem cell transplant 12/2005  . Obesity, morbid (more than 100 lbs over ideal weight or BMI > 40) ([name])   . Venous insufficiency 04/29/2011    Past Surgical History:  Procedure Laterality Date  . BACK SURGERY  2006   T6 vertebrae removed/titanium placed  . CATARACT EXTRACTION W/PHACO Right 03/31/2019   Procedure: CATARACT EXTRACTION PHACO AND INTRAOCULAR LENS PLACEMENT (IOC);  Surgeon: Baruch Goldmann, MD;  Location: [facility];  Service: Ophthalmology;  Laterality: Right;  CDE: 3.21  . CATARACT EXTRACTION W/PHACO Left 04/14/2019   Procedure: CATARACT EXTRACTION PHACO AND INTRAOCULAR LENS PLACEMENT (IOC);  Surgeon: Baruch Goldmann, MD;  Location: [facility];  Service: Ophthalmology;  Laterality: Left;  left - pt knows to arrive at 7:45, CDE: 3.55  . COLONOSCOPY  11/24/2004   Polyps in the left colon ablated/removed as described above.  Two submucosal lesions consistent with lipomas as described above not  manipulated./ Normal rectum  . COLONOSCOPY  06/20/2012   MULTIPLE RECTAL AND COLONIC POLYPS  . COLONOSCOPY N/A 03/08/2015   RMR: Capacious, redundant colon. Multiple colonic and rectosigmoid polyps removed. ablated as described above. colonic lipoma abnormal appearing terminal ileum likely a variant  of normal). Howeverwith history  biopsies obtained.   . ESOPHAGOGASTRODUODENOSCOPY N/A 03/08/2015   RMR: Hiatal hernia Polypoid gastric mucosa with multiple gastric polyps. largest polyp removed via snare polypectomgy hemostasis clip placed at base. Status post gastric biopsy. Status post video capsule placement.   [name] GIVENS CAPSULE STUDY N/A 03/08/2015   Procedure: GIVENS CAPSULE STUDY;  Surgeon: Daneil Dolin, MD;  Location: [facility];  Service: Endoscopy;  Laterality: N/A;  . LIMBAL STEM CELL TRANSPLANT    . LUNG BIOPSY  6/06  . MULTIPLE TOOTH EXTRACTIONS  04/2005  . port a cath placement    . PORT-A-CATH REMOVAL  09/08/2012   Procedure: REMOVAL PORT-A-CATH;  Surgeon: Melrose Nakayama, MD;  Location: [facility] OR;  Service: Thoracic;  Laterality: N/A;    Social History   Socioeconomic History  . Marital status: Married    Spouse name: Not on file  . Number of children: 2  . Years of education: Not on file  . Highest education level: Not on file  Occupational History  . Occupation: disability due to back    Employer: UNEMPLOYED  Tobacco Use  . Smoking status: Former Smoker    Packs/day: 0.50    Years:  15.00    Pack years: 7.50    Quit date: 11/11/2002    Years since quitting: 17.1  . Smokeless tobacco: Never Used  Substance and Sexual Activity  . Alcohol use: No    Alcohol/week: 0.0 standard drinks  . Drug use: No  . Sexual activity: Yes    Birth control/protection: None  Other Topics Concern  . Not on file  Social History Narrative   Married   No regular exercise   Social Determinants of Health   Financial Resource Strain:   . Difficulty of Paying Living Expenses:   Food Insecurity:   . Worried About Charity fundraiser in the Last Year:   . Arboriculturist in the Last Year:   Transportation Needs:   . Film/video editor (Medical):   [name] Lack of Transportation (Non-Medical):   Physical Activity:   . Days of Exercise per Week:   . Minutes of Exercise per  Session:   Stress:   . Feeling of Stress :   Social Connections:   . Frequency of Communication with Friends and Family:   . Frequency of Social Gatherings with Friends and Family:   . Attends Religious Services:   . Active Member of Clubs or Organizations:   . Attends Archivist Meetings:   [name] Marital Status:     Family History  Problem Relation Age of Onset  . Cancer Mother        lung  . Hypertension Mother   . Hyperlipidemia Mother   . Cancer Father        prostate  . Hypertension Father   . Colon cancer Neg Hx   . Diabetes Neg Hx     Outpatient Encounter Medications as of 12/14/2019  Medication Sig  . apixaban (ELIQUIS) 5 MG TABS tablet Take 1 tablet (5 mg total) by mouth 2 (two) times daily.  . Blood Glucose Monitoring Suppl (ONE TOUCH ULTRA 2) w/Device KIT USE TO TEST BLOOD SUGAR ONCE D UTD  . clotrimazole-betamethasone (LOTRISONE) cream Apply 1 application topically 2 (two) times daily as needed (leg infection).   . folic acid (FOLVITE) 1 MG tablet TAKE 1 TABLET(1 MG) BY MOUTH DAILY  . furosemide (LASIX) 20 MG tablet Take 20 mg by mouth daily as needed for fluid or edema.   [name] glimepiride (AMARYL) 1 MG tablet Take 1 tablet (1 mg total) by mouth 2 (two) times daily with a meal. take 1 tablet by mouth twice daily WITH BREAKFAST  . insulin degludec (TRESIBA FLEXTOUCH) 100 UNIT/ML FlexTouch Pen Inject 0.2 mLs (20 Units total) into the skin daily.  . Insulin Pen Needle (B-D ULTRAFINE III SHORT PEN) 31G X 8 MM MISC 1 each by Does not apply route as directed.  . Lancets (ONETOUCH DELICA PLUS [id number]) MISC USE TO TEST BLOOD SUGAR ONCE D  . metFORMIN (GLUCOPHAGE) 500 MG tablet Take 1 tablet (500 mg total) by mouth 2 (two) times daily with a meal.  . metoprolol tartrate (LOPRESSOR) 25 MG tablet Take 1 tablet (25 mg total) by mouth 2 (two) times daily.  Glory Rosebush ULTRA test strip USE TO TEST BLOOD SUGAR  4 TIMES A DAY  . predniSONE (DELTASONE) 10 MG tablet Take one  tablet PO every day for two weeks, then one tablet PO every other day for two weeks.  . rosuvastatin (CRESTOR) 40 MG tablet Take 40 mg by mouth daily.  [name] sulfamethoxazole-trimethoprim (BACTRIM) 400-80 MG tablet Take 1 tablet by mouth 2 (two)  times daily.  . [DISCONTINUED] ONETOUCH ULTRA test strip USE TO TEST BLOOD SUGAR ONCE D UTD  . [DISCONTINUED] pantoprazole (PROTONIX) 40 MG tablet TAKE 1 TABLET(40 MG) BY MOUTH DAILY  . [DISCONTINUED] riTUXimab in sodium chloride 0.9 % 250 mL Inject into the vein once a week. Weekly x 4 weeks   No facility-administered encounter medications on file as of 12/14/2019.    ALLERGIES: Allergies  Allergen Reactions  . Penicillins Rash    Has patient had a PCN reaction causing immediate rash, facial/tongue/throat swelling, SOB or lightheadedness with hypotension: Unknown Has patient had a PCN reaction causing severe rash involving mucus membranes or skin necrosis: Unknown Has patient had a PCN reaction that required hospitalization: Unknown Has patient had a PCN reaction occurring within the last 10 years: No If all of the above answers are "NO", then may proceed with Cephalosporin use.     VACCINATION STATUS: Immunization History  Administered Date(s) Administered  . Fluad Quad(high Dose 65+) 08/04/2019  . Influenza Split 06/08/2012  . Influenza,inj,Quad PF,6+ Mos 07/14/2013, 06/20/2015  . Moderna SARS-COVID-2 Vaccination 11/03/2019, 12/05/2019    Diabetes He presents for his initial diabetic visit. He has type 2 diabetes mellitus. Onset time: He was diagnosed in March 2021 after at least 2 years of exposure to steroids related to his B-cell lymphoma which required autologous stem cell transplant bone marrow. His disease course has been worsening. There are no hypoglycemic associated symptoms. Pertinent negatives for hypoglycemia include no confusion, headaches, pallor or seizures. Associated symptoms include blurred vision, polydipsia and polyuria.  Pertinent negatives for diabetes include no chest pain, no fatigue, no polyphagia and no weakness. There are no hypoglycemic complications. Symptoms are worsening. Diabetic complications include PVD. Risk factors for coronary artery disease include diabetes mellitus, dyslipidemia, family history, male sex, obesity, tobacco exposure and sedentary lifestyle. Current diabetic treatments: He is currently on glimepiride 1 mg twice a day. His weight is fluctuating minimally. He is following a generally unhealthy diet. When asked about meal planning, he reported none. He has not had a previous visit with a dietitian. He rarely participates in exercise. His home blood glucose trend is increasing steadily (Point-of-care blood glucose was 401, his records show blood glucose readings of 525, 487, 442.). (He did not bring any meter nor logs to review with him today.  His A1c was >15.5% at end of March 2021. ) An ACE inhibitor/angiotensin II receptor blocker is not being taken. Eye exam is current.  Hyperlipidemia This is a chronic problem. The current episode started more than 1 year ago. The problem is controlled. Exacerbating diseases include diabetes and obesity. Factors aggravating his hyperlipidemia include beta blockers. Pertinent negatives include no chest pain, myalgias or shortness of breath. Current antihyperlipidemic treatment includes statins. Risk factors for coronary artery disease include dyslipidemia, diabetes mellitus, family history, obesity, male sex, hypertension and a sedentary lifestyle.  Hypertension This is a chronic problem. Associated symptoms include blurred vision. Pertinent negatives include no chest pain, headaches, neck pain, palpitations or shortness of breath. Risk factors for coronary artery disease include dyslipidemia, diabetes mellitus, male gender, obesity, sedentary lifestyle, smoking/tobacco exposure and family history. Past treatments include beta blockers. Hypertensive end-organ  damage includes PVD.    Review of Systems  Constitutional: Negative for chills, fatigue, fever and unexpected weight change.  HENT: Negative for dental problem, mouth sores and trouble swallowing.   Eyes: Positive for blurred vision. Negative for visual disturbance.  Respiratory: Negative for cough, choking, chest tightness, shortness of breath and  wheezing.   Cardiovascular: Negative for chest pain, palpitations and leg swelling.  Gastrointestinal: Negative for abdominal distention, abdominal pain, constipation, diarrhea, nausea and vomiting.  Endocrine: Positive for polydipsia and polyuria. Negative for polyphagia.  Genitourinary: Negative for dysuria, flank pain, hematuria and urgency.  Musculoskeletal: Positive for gait problem. Negative for back pain, myalgias and neck pain.  Skin: Positive for color change and wound. Negative for pallor and rash.  Neurological: Negative for seizures, syncope, weakness, numbness and headaches.  Psychiatric/Behavioral: Negative.  Negative for confusion and dysphoric mood.    Objective:    Vitals with BMI 12/14/2019 11/21/2019 10/04/2019  Height 5' 11"  - 5' 11"   Weight 259 lbs 255 lbs 268 lbs  BMI 97.98 - 92.11  Systolic 941 740 814  Diastolic 83 84 69  Pulse 83 85 87    BP 139/83   Pulse 83   Ht 5' 11"  (1.803 m)   Wt 259 lb (117.5 kg)   BMI 36.12 kg/m   Wt Readings from Last 3 Encounters:  12/14/19 259 lb (117.5 kg)  11/21/19 255 lb (115.7 kg)  10/04/19 268 lb (121.6 kg)     Physical Exam Constitutional:      General: He is not in acute distress.    Appearance: He is well-developed.  HENT:     Head: Normocephalic and atraumatic.  Neck:     Thyroid: No thyromegaly.     Trachea: No tracheal deviation.  Cardiovascular:     Rate and Rhythm: Normal rate.     Pulses:          Dorsalis pedis pulses are 1+ on the right side and 1+ on the left side.       Posterior tibial pulses are 1+ on the right side and 1+ on the left side.      Heart sounds: Normal heart sounds, S1 normal and S2 normal. No murmur. No gallop.   Pulmonary:     Effort: Pulmonary effort is normal. No respiratory distress.     Breath sounds: No wheezing.  Abdominal:     General: Bowel sounds are normal. There is no distension.     Palpations: Abdomen is soft.     Tenderness: There is no abdominal tenderness. There is no guarding.  Musculoskeletal:        General: Swelling present.     Right shoulder: No swelling or deformity.     Cervical back: Normal range of motion and neck supple.     Right lower leg: Edema present.     Left lower leg: Edema present.     Comments: Walks with a cane  Skin:    General: Skin is warm and dry.     Findings: Lesion present. No rash.     Nails: There is no clubbing.     Comments: Cellulitis over venostasis of BLE  Neurological:     Mental Status: He is alert and oriented to person, place, and time.     Cranial Nerves: No cranial nerve deficit.     Sensory: No sensory deficit.     Gait: Gait normal.     Deep Tendon Reflexes: Reflexes are normal and symmetric.  Psychiatric:        Speech: Speech normal.        Behavior: Behavior normal. Behavior is cooperative.        Thought Content: Thought content normal.        Judgment: Judgment normal.     CMP ( most recent) CMP  Component Value Date/Time   NA 135 11/21/2019 1302   K 4.4 11/21/2019 1302   CL 97 (L) 11/21/2019 1302   CO2 27 11/21/2019 1302   GLUCOSE 487 (H) 11/21/2019 1302   BUN 17 11/21/2019 1302   CREATININE 1.06 11/21/2019 1302   CALCIUM 9.6 11/21/2019 1302   PROT 6.4 (L) 11/21/2019 1302   ALBUMIN 3.6 11/21/2019 1302   AST 16 11/21/2019 1302   ALT 27 11/21/2019 1302   ALKPHOS 64 11/21/2019 1302   BILITOT 0.8 11/21/2019 1302   GFRNONAA >60 11/21/2019 1302   GFRAA >60 11/21/2019 1302    Diabetic Labs (most recent): Lab Results  Component Value Date   HGBA1C >15.5 (H) 11/21/2019   HGBA1C 4.9 03/29/2019   HGBA1C 5.3 09/06/2017     Lab Results  Component Value Date   TSH 1.122 09/08/2017      Assessment & Plan:   1. Uncontrolled type 2 diabetes mellitus with hyperglycemia ([name])  - Avery has currently uncontrolled symptomatic type 2 DM since  69 years of age,  with most recent A1c of  >15.5 %. Recent labs reviewed. - I had a long discussion with him about the progressive nature of diabetes and the pathology behind its complications. -his diabetes is complicated by obesity/sedentary life, peripheral arterial disease with venous stasis and he remains at a high risk for more acute and chronic complications which include CAD, CVA, CKD, retinopathy, and neuropathy. These are all discussed in detail with him.  - I have counseled him on diet  and weight management  by adopting a carbohydrate restricted/protein rich diet. Patient is encouraged to switch to  unprocessed or minimally processed     complex starch and increased protein intake (animal or plant source), fruits, and vegetables. -  he is advised to stick to a routine mealtimes to eat 3 meals  a day and avoid unnecessary snacks ( to snack only to correct hypoglycemia).   - he admits that there is a room for improvement in his food and drink choices. - Suggestion is made for him to avoid simple carbohydrates  from his diet including Cakes, Sweet Desserts, Ice Cream, Soda (diet and regular), Sweet Tea, Candies, Chips, Cookies, Store Bought Juices, Alcohol in Excess of  1-2 drinks a day, Artificial Sweeteners,  Coffee Creamer, and "Sugar-free" Products. This will help patient to have more stable blood glucose profile and potentially avoid unintended weight gain.  - he will be scheduled with Jearld Fenton, RDN, CDE for diabetes education.  - I have approached him with the following individualized plan to manage  his diabetes and patient agrees:   -Given his current glycemic burden, he will need insulin treatment in order for him to achieve control of diabetes  to target.   -He is approached for basal insulin and he accepts.  I demonstrated insulin use technique in the exam room.  He was given a sample of [place] and instruction to inject 20 units nightly, associated with  strict monitoring of glucose 4 times a day-before meals and at bedtime. - he is warned not to take insulin without proper monitoring per orders. - Adjustment parameters are given to him for hypo and hyperglycemia in writing. - he is encouraged to call clinic for blood glucose levels less than 70 or above 300 mg /dl. - he is advised to continue glimepiride 1 mg p.o. twice daily, therapeutically suitable for patient . -He will also benefit from metformin treatment.  I discussed and added  Metformin 500 mg p.o. twice daily. - he is not a candidate for SGLT2 inhibitors due to compromised peripheral circulation.    - he will be considered for incretin therapy as appropriate next visit.  - Specific targets for  A1c;  LDL, HDL,  and Triglycerides were discussed with the patient.  2) Blood Pressure /Hypertension:  his blood pressure is  controlled to target.   he is advised to continue his current medications including metoprolol 25 mg p.o. twice daily, Lasix as needed.    3) Lipids/Hyperlipidemia: He does not have recent lipid panel to review.  He is advised to continue rosuvastatin 40 mg p.o. nightly. Side effects and precautions discussed with him.  4)  Weight/Diet:  Body mass index is 36.12 kg/m.  -   clearly complicating his diabetes care.   he is  a candidate for weight loss. I discussed with him the fact that loss of 5 - 10% of his  current body weight will have the most impact on his diabetes management.  Exercise, and detailed carbohydrates information provided  -  detailed on discharge instructions.  5) Cellulitis of BLE: -This is a result of chronic venous stasis, will need evaluation by vascular surgery.  In the meantime he will need treatment for superficial cellulitis.  I  discussed and added Bactrim 400/80 mg p.o. twice daily for 10 days.  6) Chronic Care/Health Maintenance:  -he  is on Statin medications and  is encouraged to initiate and continue to follow up with Ophthalmology, Dentist,  Podiatrist at least yearly or according to recommendations, and advised to   stay away from smoking. I have recommended yearly flu vaccine and pneumonia vaccine at least every 5 years; moderate intensity exercise for up to 150 minutes weekly; and  sleep for at least 7 hours a day.  - he is  advised to maintain close follow up with Redmond School, MD for primary care needs, as well as his other providers for optimal and coordinated care.   - Time spent in this patient care: 60 min, of which > 50% was spent in  counseling  him about his currently uncontrolled, complicated type 2 diabetes, hyperlipidemia, hypertension and the rest reviewing his blood glucose logs , discussing his hypoglycemia and hyperglycemia episodes, reviewing his current and  previous labs / studies  ( including abstraction from other facilities) and medications  doses and developing a  long term treatment plan based on the latest standards of care/ guidelines; and documenting his care.    Please refer to Patient Instructions for Blood Glucose Monitoring and Insulin/Medications Dosing Guide"  in media tab for additional information. Please  also refer to " Patient Self Inventory" in the Media  tab for reviewed elements of pertinent patient history.  [name] participated in the discussions, expressed understanding, and voiced agreement with the above plans.  All questions were answered to his satisfaction. he is encouraged to contact clinic should he have any questions or concerns prior to his return visit.   Follow up plan: - Return in about 10 days (around 12/24/2019) for Bring Meter and Logs- A1c in Office.  Glade Lloyd, MD [facility] [address] [place], [place] [address] Phone: [phone]  Fax: [phone]    12/14/2019, 9:27 AM  This note was partially dictated with voice recognition software. Similar sounding words can be transcribed inadequately or may not  be corrected upon review.

## 2019-12-14 NOTE — Progress Notes (Signed)
  Medical Nutrition Therapy:  Appt start time: 0900 end time:  0930.   Assessment:  Primary concerns today: Di.   Preferred Learning Style: Diabetes Type 2, Obesity, HTN and Hyperlipidemia. Lives with his wife. Daughter drove him today. He is walking with a cane. Will ask safety questions next visit. Walk in from Dr. Dorris Fetch, endocrinology. A1C > 15%. Complains of glucosuria, Hyperglycemia, increased thirst, chronic fatigue and weeping cellulitis on legs. Eats 2-3 times per day. Stays thirsty and blurry vision. Started on 20 units of Tresiba,  Metformin 500 mg BID and continue on Glimepiride. Willing to make changes with his diet and medications to improve his DM.   24 hr recall B boiled egg and toast water L- Green beans, water Dinner: baked chicken, water   Usual physical activity: ADL  Estimated energy needs: 1800 calories 200 g carbohydrates 135 g protein 50 g fat  Progress Towards Goal(s):  In progress.   Nutritional Diagnosis:  NB-1.1 Food and nutrition-related knowledge deficit As related to Diabetes Type 2.  As evidenced by A1C > 15%.    Intervention:  Nutrition and Diabetes education provided on My Plate, CHO counting, meal planning, portion sizes, timing of meals, avoiding snacks between meals unless having a low blood sugar, target ranges for A1C and blood sugars, signs/symptoms and treatment of hyper/hypoglycemia, monitoring blood sugars, taking medications as prescribed, benefits of exercising 30 minutes per day and prevention of complications of DM.  Goals Eat three meals per day at times discussed. Take 20 units of Tresiba daily. Take 500 mg of Metformin daily; after breakfast and after dinner Drink only water  Avoid snacks between meals Follow the Plate Method Test blood sugars 4 times per day. .  Teaching Method Utilized:  Visual Auditory Hands on  Handouts given during visit include:  The Plate method  Diabetes instructions Herbs to season  with.  Barriers to learning/adherence to lifestyle change: limited mobility  Demonstrated degree of understanding via:  Teach Back   Monitoring/Evaluation:  Dietary intake, exercise, , and body weight in 2 week(s).

## 2019-12-14 NOTE — Patient Instructions (Signed)

## 2019-12-18 NOTE — Telephone Encounter (Signed)
Unable to reach pt

## 2019-12-21 ENCOUNTER — Telehealth: Payer: Self-pay | Admitting: "Endocrinology

## 2019-12-21 NOTE — Telephone Encounter (Signed)
Please call pt

## 2019-12-21 NOTE — Telephone Encounter (Signed)
Called pt, discussed tresiba and how to take it, understanding voiced.

## 2019-12-22 DIAGNOSIS — E1129 Type 2 diabetes mellitus with other diabetic kidney complication: Secondary | ICD-10-CM | POA: Diagnosis not present

## 2019-12-22 DIAGNOSIS — Z87891 Personal history of nicotine dependence: Secondary | ICD-10-CM | POA: Diagnosis not present

## 2019-12-22 DIAGNOSIS — I4891 Unspecified atrial fibrillation: Secondary | ICD-10-CM | POA: Diagnosis not present

## 2019-12-22 DIAGNOSIS — I2510 Atherosclerotic heart disease of native coronary artery without angina pectoris: Secondary | ICD-10-CM | POA: Diagnosis not present

## 2019-12-25 ENCOUNTER — Encounter: Payer: Self-pay | Admitting: "Endocrinology

## 2019-12-25 ENCOUNTER — Encounter: Payer: Medicare Other | Attending: "Endocrinology | Admitting: Nutrition

## 2019-12-25 ENCOUNTER — Other Ambulatory Visit: Payer: Self-pay

## 2019-12-25 ENCOUNTER — Encounter: Payer: Self-pay | Admitting: Nutrition

## 2019-12-25 ENCOUNTER — Ambulatory Visit (INDEPENDENT_AMBULATORY_CARE_PROVIDER_SITE_OTHER): Payer: Medicare Other | Admitting: "Endocrinology

## 2019-12-25 VITALS — BP 115/74 | HR 73 | Ht 71.0 in | Wt 264.0 lb

## 2019-12-25 VITALS — Ht 71.0 in | Wt 264.0 lb

## 2019-12-25 DIAGNOSIS — E1165 Type 2 diabetes mellitus with hyperglycemia: Secondary | ICD-10-CM | POA: Insufficient documentation

## 2019-12-25 DIAGNOSIS — E782 Mixed hyperlipidemia: Secondary | ICD-10-CM | POA: Diagnosis not present

## 2019-12-25 DIAGNOSIS — I10 Essential (primary) hypertension: Secondary | ICD-10-CM

## 2019-12-25 DIAGNOSIS — Z6836 Body mass index (BMI) 36.0-36.9, adult: Secondary | ICD-10-CM | POA: Diagnosis present

## 2019-12-25 MED ORDER — TRESIBA FLEXTOUCH 100 UNIT/ML ~~LOC~~ SOPN: 40.0000 [IU] | PEN_INJECTOR | Freq: Every day | SUBCUTANEOUS | 2 refills | Status: DC

## 2019-12-25 NOTE — Patient Instructions (Signed)

## 2019-12-25 NOTE — Progress Notes (Signed)
Endocrinology Consult Note       12/25/2019, 12:47 PM   Subjective:    Patient ID: [name], male    DOB: 06-26-1951.  [name] is being seen in consultation for management of currently uncontrolled symptomatic diabetes requested by  Redmond School, MD.   Past Medical History:  Diagnosis Date  . Asthma    as child  . Diabetes mellitus without complication ([place])   . DLBCL (diffuse large B cell lymphoma) ([place]) 02/28/2009  . Edema   . Heart failure, diastolic, chronic ([place])    patient denies  . Hyperlipidemia, mixed   . Hypertension   . IDA (iron deficiency anemia)   . Large cell lymphoma ([place]) 01/2005   autologous stem cell transplant 12/2005  . Obesity, morbid (more than 100 lbs over ideal weight or BMI > 40) ([facility])   . Venous insufficiency 04/29/2011    Past Surgical History:  Procedure Laterality Date  . BACK SURGERY  2006   T6 vertebrae removed/titanium placed  . CATARACT EXTRACTION W/PHACO Right 03/31/2019   Procedure: CATARACT EXTRACTION PHACO AND INTRAOCULAR LENS PLACEMENT (IOC);  Surgeon: Baruch Goldmann, MD;  Location: [facility];  Service: Ophthalmology;  Laterality: Right;  CDE: 3.21  . CATARACT EXTRACTION W/PHACO Left 04/14/2019   Procedure: CATARACT EXTRACTION PHACO AND INTRAOCULAR LENS PLACEMENT (IOC);  Surgeon: Baruch Goldmann, MD;  Location: [facility];  Service: Ophthalmology;  Laterality: Left;  left - pt knows to arrive at 7:45, CDE: 3.55  . COLONOSCOPY  11/24/2004   Polyps in the left colon ablated/removed as described above.  Two submucosal lesions consistent with lipomas as described above not  manipulated./ Normal rectum  . COLONOSCOPY  06/20/2012   MULTIPLE RECTAL AND COLONIC POLYPS  . COLONOSCOPY N/A 03/08/2015   RMR: Capacious, redundant colon. Multiple colonic and rectosigmoid polyps removed. ablated as described above. colonic lipoma abnormal appearing terminal ileum likely a variant  of normal). Howeverwith history  biopsies obtained.   . ESOPHAGOGASTRODUODENOSCOPY N/A 03/08/2015   RMR: Hiatal hernia Polypoid gastric mucosa with multiple gastric polyps. largest polyp removed via snare polypectomgy hemostasis clip placed at base. Status post gastric biopsy. Status post video capsule placement.   Marland Kitchen GIVENS CAPSULE STUDY N/A 03/08/2015   Procedure: GIVENS CAPSULE STUDY;  Surgeon: Daneil Dolin, MD;  Location: [facility] SUITE;  Service: Endoscopy;  Laterality: N/A;  . LIMBAL STEM CELL TRANSPLANT    . LUNG BIOPSY  6/06  . MULTIPLE TOOTH EXTRACTIONS  04/2005  . port a cath placement    . PORT-A-CATH REMOVAL  09/08/2012   Procedure: REMOVAL PORT-A-CATH;  Surgeon: Melrose Nakayama, MD;  Location: [facility] OR;  Service: Thoracic;  Laterality: N/A;    Social History   Socioeconomic History  . Marital status: Married    Spouse name: Not on file  . Number of children: 2  . Years of education: Not on file  . Highest education level: Not on file  Occupational History  . Occupation: disability due to back    Employer: UNEMPLOYED  Tobacco Use  . Smoking status: Former Smoker    Packs/day: 0.50    Years:  15.00    Pack years: 7.50    Quit date: 11/11/2002    Years since quitting: 17.1  . Smokeless tobacco: Never Used  Substance and Sexual Activity  . Alcohol use: No    Alcohol/week: 0.0 standard drinks  . Drug use: No  . Sexual activity: Yes    Birth control/protection: None  Other Topics Concern  . Not on file  Social History Narrative   Married   No regular exercise   Social Determinants of Health   Financial Resource Strain:   . Difficulty of Paying Living Expenses:   Food Insecurity:   . Worried About Charity fundraiser in the Last Year:   . Arboriculturist in the Last Year:   Transportation Needs:   . Film/video editor (Medical):   Marland Kitchen Lack of Transportation (Non-Medical):   Physical Activity:   . Days of Exercise per Week:   . Minutes of Exercise per  Session:   Stress:   . Feeling of Stress :   Social Connections:   . Frequency of Communication with Friends and Family:   . Frequency of Social Gatherings with Friends and Family:   . Attends Religious Services:   . Active Member of Clubs or Organizations:   . Attends Archivist Meetings:   Marland Kitchen Marital Status:     Family History  Problem Relation Age of Onset  . Cancer Mother        lung  . Hypertension Mother   . Hyperlipidemia Mother   . Cancer Father        prostate  . Hypertension Father   . Colon cancer Neg Hx   . Diabetes Neg Hx     Outpatient Encounter Medications as of 12/25/2019  Medication Sig  . apixaban (ELIQUIS) 5 MG TABS tablet Take 1 tablet (5 mg total) by mouth 2 (two) times daily.  . Blood Glucose Monitoring Suppl (ONE TOUCH ULTRA 2) w/Device KIT USE TO TEST BLOOD SUGAR ONCE D UTD  . clotrimazole-betamethasone (LOTRISONE) cream Apply 1 application topically 2 (two) times daily as needed (leg infection).   . folic acid (FOLVITE) 1 MG tablet TAKE 1 TABLET(1 MG) BY MOUTH DAILY  . furosemide (LASIX) 20 MG tablet Take 20 mg by mouth daily as needed for fluid or edema.   Marland Kitchen glimepiride (AMARYL) 1 MG tablet Take 1 tablet (1 mg total) by mouth 2 (two) times daily with a meal. take 1 tablet by mouth twice daily WITH BREAKFAST  . insulin degludec (TRESIBA FLEXTOUCH) 100 UNIT/ML FlexTouch Pen Inject 0.4 mLs (40 Units total) into the skin at bedtime.  . Insulin Pen Needle (B-D ULTRAFINE III SHORT PEN) 31G X 8 MM MISC 1 each by Does not apply route as directed.  . Lancets (ONETOUCH DELICA PLUS [id number]) MISC USE TO TEST BLOOD SUGAR ONCE D  . metFORMIN (GLUCOPHAGE) 500 MG tablet Take 1 tablet (500 mg total) by mouth 2 (two) times daily with a meal.  . metoprolol tartrate (LOPRESSOR) 25 MG tablet Take 1 tablet (25 mg total) by mouth 2 (two) times daily.  Glory Rosebush ULTRA test strip USE TO TEST BLOOD SUGAR  4 TIMES A DAY  . predniSONE (DELTASONE) 10 MG tablet Take one  tablet PO every day for two weeks, then one tablet PO every other day for two weeks.  . rosuvastatin (CRESTOR) 40 MG tablet Take 40 mg by mouth daily.  Marland Kitchen sulfamethoxazole-trimethoprim (BACTRIM) 400-80 MG tablet Take 1 tablet by mouth 2 (  two) times daily.  . [DISCONTINUED] insulin degludec (TRESIBA FLEXTOUCH) 100 UNIT/ML FlexTouch Pen Inject 0.2 mLs (20 Units total) into the skin daily.   No facility-administered encounter medications on file as of 12/25/2019.    ALLERGIES: Allergies  Allergen Reactions  . Penicillins Rash    Has patient had a PCN reaction causing immediate rash, facial/tongue/throat swelling, SOB or lightheadedness with hypotension: Unknown Has patient had a PCN reaction causing severe rash involving mucus membranes or skin necrosis: Unknown Has patient had a PCN reaction that required hospitalization: Unknown Has patient had a PCN reaction occurring within the last 10 years: No If all of the above answers are "NO", then may proceed with Cephalosporin use.     VACCINATION STATUS: Immunization History  Administered Date(s) Administered  . Fluad Quad(high Dose 65+) 08/04/2019  . Influenza Split 06/08/2012  . Influenza,inj,Quad PF,6+ Mos 07/14/2013, 06/20/2015  . Moderna SARS-COVID-2 Vaccination 11/03/2019, 12/05/2019    Diabetes He presents for his follow-up diabetic visit. He has type 2 diabetes mellitus. Onset time: He was diagnosed in March 2021 after at least 2 years of exposure to steroids related to his B-cell lymphoma which required autologous stem cell transplant bone marrow. His disease course has been worsening. There are no hypoglycemic associated symptoms. Pertinent negatives for hypoglycemia include no confusion, headaches, pallor or seizures. Associated symptoms include blurred vision, polydipsia and polyuria. Pertinent negatives for diabetes include no chest pain, no fatigue, no polyphagia and no weakness. There are no hypoglycemic complications. Symptoms are  improving. Diabetic complications include PVD. Risk factors for coronary artery disease include diabetes mellitus, dyslipidemia, family history, male sex, obesity, tobacco exposure and sedentary lifestyle. Current diabetic treatments: He is currently on glimepiride 1 mg twice a day. His weight is fluctuating minimally. He is following a generally unhealthy diet. When asked about meal planning, he reported none. He has not had a previous visit with a dietitian. He rarely participates in exercise. His home blood glucose trend is decreasing steadily. His breakfast blood glucose range is generally >200 mg/dl. His lunch blood glucose range is generally >200 mg/dl. His dinner blood glucose range is generally >200 mg/dl. His bedtime blood glucose range is generally >200 mg/dl. His overall blood glucose range is >200 mg/dl. (He presents with his logs showing improving, however still persistently above target glycemic profile.  His recent A1c was  >15.5% at end of March 2021. ) An ACE inhibitor/angiotensin II receptor blocker is not being taken. Eye exam is current.  Hyperlipidemia This is a chronic problem. The current episode started more than 1 year ago. The problem is controlled. Exacerbating diseases include diabetes and obesity. Factors aggravating his hyperlipidemia include beta blockers. Pertinent negatives include no chest pain, myalgias or shortness of breath. Current antihyperlipidemic treatment includes statins. Risk factors for coronary artery disease include dyslipidemia, diabetes mellitus, family history, obesity, male sex, hypertension and a sedentary lifestyle.  Hypertension This is a chronic problem. Associated symptoms include blurred vision. Pertinent negatives include no chest pain, headaches, neck pain, palpitations or shortness of breath. Risk factors for coronary artery disease include dyslipidemia, diabetes mellitus, male gender, obesity, sedentary lifestyle, smoking/tobacco exposure and family  history. Past treatments include beta blockers. Hypertensive end-organ damage includes PVD.    Review of Systems  Constitutional: Negative for chills, fatigue, fever and unexpected weight change.  HENT: Negative for dental problem, mouth sores and trouble swallowing.   Eyes: Positive for blurred vision. Negative for visual disturbance.  Respiratory: Negative for cough, choking, chest tightness, shortness of  breath and wheezing.   Cardiovascular: Negative for chest pain, palpitations and leg swelling.  Gastrointestinal: Negative for abdominal distention, abdominal pain, constipation, diarrhea, nausea and vomiting.  Endocrine: Positive for polydipsia and polyuria. Negative for polyphagia.  Genitourinary: Negative for dysuria, flank pain, hematuria and urgency.  Musculoskeletal: Positive for gait problem. Negative for back pain, myalgias and neck pain.  Skin: Positive for color change and wound. Negative for pallor and rash.  Neurological: Negative for seizures, syncope, weakness, numbness and headaches.  Psychiatric/Behavioral: Negative.  Negative for confusion and dysphoric mood.    Objective:    Vitals with BMI 12/25/2019 12/25/2019 12/14/2019  Height 5' 11"  5' 11"  5' 11"   Weight 264 lbs 264 lbs 259 lbs  BMI 36.84 53.97 67.34  Systolic 193 - 790  Diastolic 74 - 83  Pulse 73 - 83    BP 115/74   Pulse 73   Ht 5' 11"  (1.803 m)   Wt 264 lb (119.7 kg)   BMI 36.82 kg/m   Wt Readings from Last 3 Encounters:  12/25/19 264 lb (119.7 kg)  12/25/19 264 lb (119.7 kg)  12/14/19 259 lb (117.5 kg)     Physical Exam Constitutional:      General: He is not in acute distress.    Appearance: He is well-developed.  HENT:     Head: Normocephalic and atraumatic.  Neck:     Thyroid: No thyromegaly.     Trachea: No tracheal deviation.  Cardiovascular:     Rate and Rhythm: Normal rate.     Pulses:          Dorsalis pedis pulses are 1+ on the right side and 1+ on the left side.       Posterior  tibial pulses are 1+ on the right side and 1+ on the left side.     Heart sounds: Normal heart sounds, S1 normal and S2 normal. No murmur. No gallop.   Pulmonary:     Effort: Pulmonary effort is normal. No respiratory distress.     Breath sounds: No wheezing.  Abdominal:     General: Bowel sounds are normal. There is no distension.     Palpations: Abdomen is soft.     Tenderness: There is no abdominal tenderness. There is no guarding.  Musculoskeletal:        General: Swelling present.     Right shoulder: No swelling or deformity.     Cervical back: Normal range of motion and neck supple.     Right lower leg: Edema present.     Left lower leg: Edema present.     Comments: Walks with a cane  Skin:    General: Skin is warm and dry.     Findings: Lesion present. No rash.     Nails: There is no clubbing.  Neurological:     Mental Status: He is alert and oriented to person, place, and time.     Cranial Nerves: No cranial nerve deficit.     Sensory: No sensory deficit.     Gait: Gait normal.     Deep Tendon Reflexes: Reflexes are normal and symmetric.  Psychiatric:        Speech: Speech normal.        Behavior: Behavior normal. Behavior is cooperative.        Thought Content: Thought content normal.        Judgment: Judgment normal.     CMP ( most recent) CMP     Component Value Date/Time  NA 135 11/21/2019 1302   K 4.4 11/21/2019 1302   CL 97 (L) 11/21/2019 1302   CO2 27 11/21/2019 1302   GLUCOSE 487 (H) 11/21/2019 1302   BUN 17 11/21/2019 1302   CREATININE 1.06 11/21/2019 1302   CALCIUM 9.6 11/21/2019 1302   PROT 6.4 (L) 11/21/2019 1302   ALBUMIN 3.6 11/21/2019 1302   AST 16 11/21/2019 1302   ALT 27 11/21/2019 1302   ALKPHOS 64 11/21/2019 1302   BILITOT 0.8 11/21/2019 1302   GFRNONAA >60 11/21/2019 1302   GFRAA >60 11/21/2019 1302    Diabetic Labs (most recent): Lab Results  Component Value Date   HGBA1C >15.5 (H) 11/21/2019   HGBA1C 4.9 03/29/2019   HGBA1C  5.3 09/06/2017    Lab Results  Component Value Date   TSH 1.122 09/08/2017      Assessment & Plan:   1. Uncontrolled type 2 diabetes mellitus with hyperglycemia (HCC)  - [place] has currently uncontrolled symptomatic type 2 DM since  69 years of age.  He presents with his logs showing improving, however still persistently above target glycemic profile.  His recent A1c was  >15.5% at end of March 2021. - Recent labs reviewed. - I had a long discussion with him about the progressive nature of diabetes and the pathology behind its complications. -his diabetes is complicated by obesity/sedentary life, peripheral arterial disease with venous stasis and he remains at a high risk for more acute and chronic complications which include CAD, CVA, CKD, retinopathy, and neuropathy. These are all discussed in detail with him.  - I have counseled him on diet  and weight management  by adopting a carbohydrate restricted/protein rich diet. Patient is encouraged to switch to  unprocessed or minimally processed     complex starch and increased protein intake (animal or plant source), fruits, and vegetables. -  he is advised to stick to a routine mealtimes to eat 3 meals  a day and avoid unnecessary snacks ( to snack only to correct hypoglycemia).   - he  admits there is a room for improvement in his diet and drink choices. -  Suggestion is made for him to avoid simple carbohydrates  from his diet including Cakes, Sweet Desserts / Pastries, Ice Cream, Soda (diet and regular), Sweet Tea, Candies, Chips, Cookies, Sweet Pastries,  Store Bought Juices, Alcohol in Excess of  1-2 drinks a day, Artificial Sweeteners, Coffee Creamer, and "Sugar-free" Products. This will help patient to have stable blood glucose profile and potentially avoid unintended weight gain.  - he will be scheduled with Jearld Fenton, RDN, CDE for diabetes education.  - I have approached him with the following individualized plan to  manage  his diabetes and patient agrees:   -Given his current glycemic burden, he will continue to need at least basal insulin in order for him to achieve and control of diabetes to target.    -He became comfortable injecting insulin.  He is advised to increase his [place] to 40 units nightly,  associated with  strict monitoring of glucose 4 times a day-before meals and at bedtime.  Insulin injection technique is redemonstrated for him. - he is warned not to take insulin without proper monitoring per orders. - he is encouraged to call clinic for blood glucose levels less than 70 or above 300 mg /dl. - he is advised to continue glimepiride 1 mg p.o. twice daily, therapeutically suitable for patient . -He is advised to continue Metformin 500 mg p.o.  twice daily.   - he is not a candidate for SGLT2 inhibitors due to compromised peripheral circulation.    - he will be considered for incretin therapy as appropriate next visit.  - Specific targets for  A1c;  LDL, HDL,  and Triglycerides were discussed with the patient.  2) Blood Pressure /Hypertension:  His blood pressure is controlled to target.  he is advised to continue his current medications including metoprolol 25 mg p.o. twice daily, Lasix as needed.    3) Lipids/Hyperlipidemia: He does not have recent lipid panel to review.  He is advised to continue atorvastatin 40 mg p.o. nightly.   Side effects and precautions discussed with him.  4)  Weight/Diet:  Body mass index is 36.82 kg/m.  -   clearly complicating his diabetes care.   he is  a candidate for weight loss. I discussed with him the fact that loss of 5 - 10% of his  current body weight will have the most impact on his diabetes management.  Exercise, and detailed carbohydrates information provided  -  detailed on discharge instructions.  5) Cellulitis of BLE: -This is a result of chronic venous stasis, will need evaluation by vascular surgery.  In the meantime he was given a brief  course of Bactrim which helped resolve superficial cellulitis.     6) Chronic Care/Health Maintenance:  -he  is on Statin medications and  is encouraged to initiate and continue to follow up with Ophthalmology, Dentist,  Podiatrist at least yearly or according to recommendations, and advised to   stay away from smoking. I have recommended yearly flu vaccine and pneumonia vaccine at least every 5 years; moderate intensity exercise for up to 150 minutes weekly; and  sleep for at least 7 hours a day.  - he is  advised to maintain close follow up with Redmond School, MD for primary care needs, as well as his other providers for optimal and coordinated care.   - Time spent on this patient care encounter:  40 min, of which > 50% was spent in  counseling and the rest reviewing his blood glucose logs , discussing his hypoglycemia and hyperglycemia episodes, reviewing his current and  previous labs / studies  ( including abstraction from other facilities) and medications  doses and developing a  long term treatment plan and documenting his care.   Please refer to Patient Instructions for Blood Glucose Monitoring and Insulin/Medications Dosing Guide"  in media tab for additional information. Please  also refer to " Patient Self Inventory" in the Media  tab for reviewed elements of pertinent patient history.  [name] participated in the discussions, expressed understanding, and voiced agreement with the above plans.  All questions were answered to his satisfaction. he is encouraged to contact clinic should he have any questions or concerns prior to his return visit.    Follow up plan: - Return in about 4 weeks (around 01/22/2020) for Follow up with Meter and Logs Only - no Labs.  Glade Lloyd, MD [facility] [address] [place], [place] [address] Phone: [phone]  Fax: [phone]    12/25/2019, 12:47 PM  This note was partially dictated  with voice recognition software. Similar sounding words can be transcribed inadequately or may not  be corrected upon review.

## 2019-12-25 NOTE — Progress Notes (Signed)
  Medical Nutrition Therapy:  Appt start time: 0900 end time:  0930.   Assessment:  Primary concerns today: Di.   Preferred Learning Style: Diabetes Type 2, Obesity, HTN and Hyperlipidemia.  Drove himself. Sees Dr. Dorris Fetch, Endocrinology. BS are 300-400's. Working on eating meals on time.  Trying to work on better balanced  meals. IDr. NIda ncreased to 40 units of Tresiba daily.  Wt Readings from Last 3 Encounters:  12/25/19 264 lb (119.7 kg)  12/25/19 264 lb (119.7 kg)  12/14/19 259 lb (117.5 kg)   Ht Readings from Last 3 Encounters:  12/25/19 5\' 11"  (1.803 m)  12/25/19 5\' 11"  (1.803 m)  12/14/19 5\' 11"  (1.803 m)   Body mass index is 36.82 kg/m. @BMIFA @ Facility age limit for growth percentiles is 20 years. Facility age limit for growth percentiles is 20 years.    24 hr recall B boiled egg and toast water L 2 pm chef salad-water D- Meat and vegetables. water   Usual physical activity: ADL  Estimated energy needs: 1800 calories 200 g carbohydrates 135 g protein 50 g fat  Progress Towards Goal(s):  In progress.   Nutritional Diagnosis:  NB-1.1 Food and nutrition-related knowledge deficit As related to Diabetes Type 2.  As evidenced by A1C > 15%.    Intervention:  Nutrition and Diabetes education provided on My Plate, CHO counting, meal planning, portion sizes, timing of meals, avoiding snacks between meals unless having a low blood sugar, target ranges for A1C and blood sugars, signs/symptoms and treatment of hyper/hypoglycemia, monitoring blood sugars, taking medications as prescribed, benefits of exercising 30 minutes per day and prevention of complications of DM.  Goals Eat three meals per day at times discussed. Take 40 units of Tresiba daily. Take 500 mg of Metformin daily; after breakfast and after dinner Drink only water  Avoid snacks between meals Follow the Plate Method Test blood sugars 4 times per day. .  Teaching Method Utilized:   Visual Auditory Hands on  Handouts given during visit include:  The Plate method  Diabetes instructions Herbs to season with.  Barriers to learning/adherence to lifestyle change: limited mobility  Demonstrated degree of understanding via:  Teach Back   Monitoring/Evaluation:  Dietary intake, exercise, , and body weight in 1 month.Marland Kitchen

## 2019-12-27 ENCOUNTER — Inpatient Hospital Stay (HOSPITAL_COMMUNITY): Payer: Medicare Other | Attending: Hematology

## 2019-12-27 ENCOUNTER — Other Ambulatory Visit: Payer: Self-pay

## 2019-12-27 DIAGNOSIS — E1165 Type 2 diabetes mellitus with hyperglycemia: Secondary | ICD-10-CM | POA: Insufficient documentation

## 2019-12-27 DIAGNOSIS — Z7952 Long term (current) use of systemic steroids: Secondary | ICD-10-CM | POA: Insufficient documentation

## 2019-12-27 DIAGNOSIS — Z8572 Personal history of non-Hodgkin lymphomas: Secondary | ICD-10-CM | POA: Diagnosis not present

## 2019-12-27 DIAGNOSIS — D591 Autoimmune hemolytic anemia, unspecified: Secondary | ICD-10-CM | POA: Diagnosis not present

## 2019-12-27 DIAGNOSIS — C8330 Diffuse large B-cell lymphoma, unspecified site: Secondary | ICD-10-CM

## 2019-12-27 DIAGNOSIS — M7989 Other specified soft tissue disorders: Secondary | ICD-10-CM | POA: Diagnosis not present

## 2019-12-27 LAB — CBC WITH DIFFERENTIAL/PLATELET
Abs Immature Granulocytes: 0.01 10*3/uL (ref 0.00–0.07)
Basophils Absolute: 0 10*3/uL (ref 0.0–0.1)
Basophils Relative: 1 %
Eosinophils Absolute: 0.3 10*3/uL (ref 0.0–0.5)
Eosinophils Relative: 5 %
HCT: 40.3 % (ref 39.0–52.0)
Hemoglobin: 12.7 g/dL — ABNORMAL LOW (ref 13.0–17.0)
Immature Granulocytes: 0 %
Lymphocytes Relative: 18 %
Lymphs Abs: 0.9 10*3/uL (ref 0.7–4.0)
MCH: 32.2 pg (ref 26.0–34.0)
MCHC: 31.5 g/dL (ref 30.0–36.0)
MCV: 102.3 fL — ABNORMAL HIGH (ref 80.0–100.0)
Monocytes Absolute: 0.6 10*3/uL (ref 0.1–1.0)
Monocytes Relative: 12 %
Neutro Abs: 3.3 10*3/uL (ref 1.7–7.7)
Neutrophils Relative %: 64 %
Platelets: 175 10*3/uL (ref 150–400)
RBC: 3.94 MIL/uL — ABNORMAL LOW (ref 4.22–5.81)
RDW: 15.1 % (ref 11.5–15.5)
WBC: 5.2 10*3/uL (ref 4.0–10.5)
nRBC: 0 % (ref 0.0–0.2)

## 2019-12-27 LAB — COMPREHENSIVE METABOLIC PANEL
ALT: 22 U/L (ref 0–44)
AST: 19 U/L (ref 15–41)
Albumin: 3.5 g/dL (ref 3.5–5.0)
Alkaline Phosphatase: 61 U/L (ref 38–126)
Anion gap: 5 (ref 5–15)
BUN: 17 mg/dL (ref 8–23)
CO2: 24 mmol/L (ref 22–32)
Calcium: 8.3 mg/dL — ABNORMAL LOW (ref 8.9–10.3)
Chloride: 107 mmol/L (ref 98–111)
Creatinine, Ser: 0.97 mg/dL (ref 0.61–1.24)
GFR calc Af Amer: >60 mL/min (ref >60)
GFR calc non Af Amer: >60 mL/min (ref >60)
Glucose, Bld: 187 mg/dL — ABNORMAL HIGH (ref 70–99)
Potassium: 4.3 mmol/L (ref 3.5–5.1)
Sodium: 136 mmol/L (ref 135–145)
Total Bilirubin: 0.6 mg/dL (ref 0.3–1.2)
Total Protein: 6.3 g/dL — ABNORMAL LOW (ref 6.5–8.1)

## 2019-12-27 LAB — IRON AND TIBC
Iron: 63 ug/dL (ref 45–182)
Saturation Ratios: 18 % (ref 17.9–39.5)
TIBC: 350 ug/dL (ref 250–450)
UIBC: 287 ug/dL

## 2019-12-27 LAB — VITAMIN D 25 HYDROXY (VIT D DEFICIENCY, FRACTURES): Vit D, 25-Hydroxy: 9.63 ng/mL — ABNORMAL LOW (ref 30–100)

## 2019-12-27 LAB — FERRITIN: Ferritin: 92 ng/mL (ref 24–336)

## 2019-12-27 LAB — FOLATE: Folate: 17.4 ng/mL (ref >5.9)

## 2019-12-27 LAB — LACTATE DEHYDROGENASE: LDH: 167 U/L (ref 98–192)

## 2019-12-27 LAB — VITAMIN B12: Vitamin B-12: 472 pg/mL (ref 180–914)

## 2019-12-28 ENCOUNTER — Encounter: Payer: Self-pay | Admitting: Nutrition

## 2019-12-28 ENCOUNTER — Other Ambulatory Visit: Payer: Self-pay

## 2019-12-28 ENCOUNTER — Inpatient Hospital Stay (HOSPITAL_COMMUNITY): Payer: Medicare Other | Admitting: Nurse Practitioner

## 2019-12-28 ENCOUNTER — Encounter (HOSPITAL_COMMUNITY): Payer: Self-pay | Admitting: Nurse Practitioner

## 2019-12-28 VITALS — BP 140/66 | HR 66 | Temp 96.2°F | Resp 19 | Wt 267.9 lb

## 2019-12-28 DIAGNOSIS — D696 Thrombocytopenia, unspecified: Secondary | ICD-10-CM

## 2019-12-28 DIAGNOSIS — Z8572 Personal history of non-Hodgkin lymphomas: Secondary | ICD-10-CM | POA: Diagnosis not present

## 2019-12-28 DIAGNOSIS — D591 Autoimmune hemolytic anemia, unspecified: Secondary | ICD-10-CM | POA: Diagnosis not present

## 2019-12-28 DIAGNOSIS — D509 Iron deficiency anemia, unspecified: Secondary | ICD-10-CM

## 2019-12-28 DIAGNOSIS — R599 Enlarged lymph nodes, unspecified: Secondary | ICD-10-CM | POA: Diagnosis not present

## 2019-12-28 DIAGNOSIS — C8330 Diffuse large B-cell lymphoma, unspecified site: Secondary | ICD-10-CM

## 2019-12-28 DIAGNOSIS — E1165 Type 2 diabetes mellitus with hyperglycemia: Secondary | ICD-10-CM | POA: Diagnosis not present

## 2019-12-28 DIAGNOSIS — E559 Vitamin D deficiency, unspecified: Secondary | ICD-10-CM | POA: Diagnosis not present

## 2019-12-28 DIAGNOSIS — Z7952 Long term (current) use of systemic steroids: Secondary | ICD-10-CM | POA: Diagnosis not present

## 2019-12-28 DIAGNOSIS — M7989 Other specified soft tissue disorders: Secondary | ICD-10-CM | POA: Diagnosis not present

## 2019-12-28 DIAGNOSIS — D5919 Other autoimmune hemolytic anemia: Secondary | ICD-10-CM

## 2019-12-28 MED ORDER — PREDNISONE 5 MG PO TABS: ORAL_TABLET | ORAL | 2 refills | Status: DC

## 2019-12-28 MED ORDER — ERGOCALCIFEROL 1.25 MG (50000 UT) PO CAPS: 50000.0000 [IU] | ORAL_CAPSULE | ORAL | 3 refills | Status: DC

## 2019-12-28 NOTE — Assessment & Plan Note (Signed)
1.  Autoimmune hemolytic anemia: -Prednisone 120 mg daily started on 08/05/2019, rituximab 4 doses from 08/15/2019 through 09/05/2019. -He was last seen on 12/22/2019 and the plan was to cut prednisone to 5 mg daily for 2 weeks followed by 5 mg every other day for the next 2 weeks. -He is currently taking 5 mg every other day. -Labs done on 12/27/2019 showed LDH 167, WBC 5.2, hemoglobin 12.7, ferritin 92, percent saturation 18, MCV 102.3, platelets 175 -We will try to infusions of IV iron -He will continue to take 5 mg of prednisone every other day. -We will see him back in 3 weeks with repeat labs.  2.  Leg swelling: -He will use Lasix 20 mg daily as needed.  3.  Poorly controlled diabetes: -Glucose today is 187.  He is taking Amaryl 2 mg twice daily. -Patient reports it is hard to control his diabetes with the prednisone.  4.  DLBCL: -Diagnosed in 2006, treated with R-CHOP with complete response on PET scan. -Relapse of lymphoma on 08/31/2005, treated with salvage chemotherapy followed by stem cell transplant on 01/15/2006. -Rituximab weekly x4 followed by every 6 months for 2 years completed on 12/05/2007. -PET scan on 08/14/2019 did not show any evidence or recurrence of lymphoma. -No B symptoms at this time.

## 2019-12-28 NOTE — Progress Notes (Signed)
[name], [place] [id number]   CLINIC:  Medical Oncology/Hematology  PCP:  [facility], [place] [address] [phone]   REASON FOR VISIT: Follow-up for hemolytic anemia   CURRENT THERAPY: Prednisone 5 mg every other day  BRIEF ONCOLOGIC HISTORY:  Oncology History Overview Note  S/P chemotherapy following stabilization of the spine.  Initial biopsy was on 07/13/2005 on his bone marrow.  Thoracic vertebra was biopsied on 02/04/2005 and mediastinum on 02/03/2005.  S/P R-CHOP and achieved an incomplete response, then underwent ICE chemotherapy pre-transplant and total body radiation with autologous stem cell transplant on 01/15/2006 and maintained on Rituxan for 2 years.  Thus far without recurrence.   DLBCL (diffuse large B cell lymphoma) ([place])  01/27/2005 Imaging   PET- diffuse activity  involving the L cervical area, extensive activity in the mediastinumn particularly in the R paratracheal area and to a lesser degree in the anterior mediastinum.  Lg area in porta caval nodes in upper abd and diffuse bone activity   02/03/2005 Pathology Results   Mediastinal lymph node demonstrating diffuse large cell lymphoma, B-type.  Vertebral bone biopsy demonstrating diffuse large b-cell lymphoma   03/11/2005 - 08/04/2005 Chemotherapy   R-CHOP x 8 cycles   05/04/2005 Imaging   PET- near resolution of previously identified uptake.   07/13/2005 Bone Marrow Biopsy   Bone marrow aspiration and biopsy is negative for lymphoma   08/31/2005 Imaging   PET- further response to therapy with findings consistent with residual disease.  Hypermetabolic activity identified within the anterior mediastinal lymph node (smaller but still pathologic).  Borderline hypermetabolic small right pleural effusion   10/20/2005 - 11/13/2005 Chemotherapy   R-ICE x 2 cycles at [facility]   01/08/2006 - 01/09/2006 Chemotherapy   Cyclophosphamide 600 mg daily x 2     01/10/2006 - 01/14/2006 Radiation Therapy   Total body radiation twice daily   01/15/2006 Bone Marrow Transplant   Stem cell transplant at [facility]   04/05/2006 - 12/05/2007 Chemotherapy   Rituxan weekly x 4 every 6 months x 2 years     CANCER STAGING: Cancer Staging DLBCL (diffuse large B cell lymphoma) ([place]) Staging form: Lymphoid Neoplasms, AJCC 6th Edition - Clinical: Stage IV - Signed by Baird Cancer, PA on 04/29/2011    INTERVAL HISTORY:  [name] 69 y.o. male returns for routine follow-up for hemolytic anemia.  Patient reports he is taking his medication as prescribed.  He reports it is hard to control his diabetes taking the prednisone. Denies any nausea, vomiting, or diarrhea. Denies any new pains. Had not noticed any recent bleeding such as epistaxis, hematuria or hematochezia. Denies recent chest pain on exertion, shortness of breath on minimal exertion, pre-syncopal episodes, or palpitations. Denies any numbness or tingling in hands or feet. Denies any recent fevers, infections, or recent hospitalizations. Patient reports appetite at 75% and energy level at 75%.  He is eating well maintain his weight this time.    REVIEW OF SYSTEMS:  Review of Systems  Respiratory: Positive for shortness of breath (With exertion).   Neurological: Positive for headaches.  All other systems reviewed and are negative.    PAST MEDICAL/SURGICAL HISTORY:  Past Medical History:  Diagnosis Date  . Asthma    as child  . Diabetes mellitus without complication ([place])   . DLBCL (diffuse large B cell lymphoma) ([place]) 02/28/2009  . Edema   . Heart failure, diastolic, chronic ([place])    patient denies  .  Hyperlipidemia, mixed   . Hypertension   . IDA (iron deficiency anemia)   . Large cell lymphoma ([place]) 01/2005   autologous stem cell transplant 12/2005  . Obesity, morbid (more than 100 lbs over ideal weight or BMI > 40) ([facility])   . Venous insufficiency 04/29/2011   Past Surgical History:  Procedure  Laterality Date  . BACK SURGERY  2006   T6 vertebrae removed/titanium placed  . CATARACT EXTRACTION W/PHACO Right 03/31/2019   Procedure: CATARACT EXTRACTION PHACO AND INTRAOCULAR LENS PLACEMENT (IOC);  Surgeon: Baruch Goldmann, MD;  Location: [facility];  Service: Ophthalmology;  Laterality: Right;  CDE: 3.21  . CATARACT EXTRACTION W/PHACO Left 04/14/2019   Procedure: CATARACT EXTRACTION PHACO AND INTRAOCULAR LENS PLACEMENT (IOC);  Surgeon: Baruch Goldmann, MD;  Location: [facility];  Service: Ophthalmology;  Laterality: Left;  left - pt knows to arrive at 7:45, CDE: 3.55  . COLONOSCOPY  11/24/2004   Polyps in the left colon ablated/removed as described above.  Two submucosal lesions consistent with lipomas as described above not  manipulated./ Normal rectum  . COLONOSCOPY  06/20/2012   MULTIPLE RECTAL AND COLONIC POLYPS  . COLONOSCOPY N/A 03/08/2015   RMR: Capacious, redundant colon. Multiple colonic and rectosigmoid polyps removed. ablated as described above. colonic lipoma abnormal appearing terminal ileum likely a variant of normal). Howeverwith history  biopsies obtained.   . ESOPHAGOGASTRODUODENOSCOPY N/A 03/08/2015   RMR: Hiatal hernia Polypoid gastric mucosa with multiple gastric polyps. largest polyp removed via snare polypectomgy hemostasis clip placed at base. Status post gastric biopsy. Status post video capsule placement.   Marland Kitchen GIVENS CAPSULE STUDY N/A 03/08/2015   Procedure: GIVENS CAPSULE STUDY;  Surgeon: Daneil Dolin, MD;  Location: [facility];  Service: Endoscopy;  Laterality: N/A;  . LIMBAL STEM CELL TRANSPLANT    . LUNG BIOPSY  6/06  . MULTIPLE TOOTH EXTRACTIONS  04/2005  . port a cath placement    . PORT-A-CATH REMOVAL  09/08/2012   Procedure: REMOVAL PORT-A-CATH;  Surgeon: Melrose Nakayama, MD;  Location: [facility];  Service: Thoracic;  Laterality: N/A;     SOCIAL HISTORY:  Social History   Socioeconomic History  . Marital status: Married    Spouse name: Not on file  . Number  of children: 2  . Years of education: Not on file  . Highest education level: Not on file  Occupational History  . Occupation: disability due to back    Employer: UNEMPLOYED  Tobacco Use  . Smoking status: Former Smoker    Packs/day: 0.50    Years: 15.00    Pack years: 7.50    Quit date: 11/11/2002    Years since quitting: 17.1  . Smokeless tobacco: Never Used  Substance and Sexual Activity  . Alcohol use: No    Alcohol/week: 0.0 standard drinks  . Drug use: No  . Sexual activity: Yes    Birth control/protection: None  Other Topics Concern  . Not on file  Social History Narrative   Married   No regular exercise   Social Determinants of Health   Financial Resource Strain:   . Difficulty of Paying Living Expenses:   Food Insecurity:   . Worried About Charity fundraiser in the Last Year:   . Arboriculturist in the Last Year:   Transportation Needs:   . Film/video editor (Medical):   Marland Kitchen Lack of Transportation (Non-Medical):   Physical Activity:   . Days of Exercise per Week:   . Minutes  of Exercise per Session:   Stress:   . Feeling of Stress :   Social Connections:   . Frequency of Communication with Friends and Family:   . Frequency of Social Gatherings with Friends and Family:   . Attends Religious Services:   . Active Member of Clubs or Organizations:   . Attends Archivist Meetings:   Marland Kitchen Marital Status:   Intimate Partner Violence:   . Fear of Current or Ex-Partner:   . Emotionally Abused:   Marland Kitchen Physically Abused:   . Sexually Abused:     FAMILY HISTORY:  Family History  Problem Relation Age of Onset  . Cancer Mother        lung  . Hypertension Mother   . Hyperlipidemia Mother   . Cancer Father        prostate  . Hypertension Father   . Colon cancer Neg Hx   . Diabetes Neg Hx     CURRENT MEDICATIONS:  Outpatient Encounter Medications as of 12/28/2019  Medication Sig  . apixaban (ELIQUIS) 5 MG TABS tablet Take 1 tablet (5 mg total) by  mouth 2 (two) times daily.  . Blood Glucose Monitoring Suppl (ONE TOUCH ULTRA 2) w/Device KIT USE TO TEST BLOOD SUGAR ONCE D UTD  . folic acid (FOLVITE) 1 MG tablet TAKE 1 TABLET(1 MG) BY MOUTH DAILY  . furosemide (LASIX) 20 MG tablet Take 20 mg by mouth daily as needed for fluid or edema.   Marland Kitchen glimepiride (AMARYL) 1 MG tablet Take 1 tablet (1 mg total) by mouth 2 (two) times daily with a meal. take 1 tablet by mouth twice daily WITH BREAKFAST  . insulin degludec (TRESIBA FLEXTOUCH) 100 UNIT/ML FlexTouch Pen Inject 0.4 mLs (40 Units total) into the skin at bedtime.  . Insulin Pen Needle (B-D ULTRAFINE III SHORT PEN) 31G X 8 MM MISC 1 each by Does not apply route as directed.  . Lancets (ONETOUCH DELICA PLUS [id number]) MISC USE TO TEST BLOOD SUGAR ONCE D  . metFORMIN (GLUCOPHAGE) 500 MG tablet Take 1 tablet (500 mg total) by mouth 2 (two) times daily with a meal.  . metoprolol tartrate (LOPRESSOR) 25 MG tablet Take 1 tablet (25 mg total) by mouth 2 (two) times daily.  Glory Rosebush ULTRA test strip USE TO TEST BLOOD SUGAR  4 TIMES A DAY  . predniSONE (DELTASONE) 10 MG tablet Take one tablet PO every day for two weeks, then one tablet PO every other day for two weeks.  . rosuvastatin (CRESTOR) 40 MG tablet Take 40 mg by mouth daily.  . clotrimazole-betamethasone (LOTRISONE) cream Apply 1 application topically 2 (two) times daily as needed (leg infection).   Marland Kitchen ergocalciferol (VITAMIN D2) 1.25 MG (50000 UT) capsule Take 1 capsule (50,000 Units total) by mouth once a week.  . [DISCONTINUED] sulfamethoxazole-trimethoprim (BACTRIM) 400-80 MG tablet Take 1 tablet by mouth 2 (two) times daily.   No facility-administered encounter medications on file as of 12/28/2019.    ALLERGIES:  Allergies  Allergen Reactions  . Penicillins Rash    Has patient had a PCN reaction causing immediate rash, facial/tongue/throat swelling, SOB or lightheadedness with hypotension: Unknown Has patient had a PCN reaction causing  severe rash involving mucus membranes or skin necrosis: Unknown Has patient had a PCN reaction that required hospitalization: Unknown Has patient had a PCN reaction occurring within the last 10 years: No If all of the above answers are "NO", then may proceed with Cephalosporin use.  PHYSICAL EXAM:  ECOG Performance status: 1  Vitals:   12/28/19 1216  BP: 140/66  Pulse: 66  Resp: 19  Temp: (!) 96.2 F (35.7 C)  SpO2: 98%   Filed Weights   12/28/19 1216  Weight: 267 lb 14.4 oz (121.5 kg)   Physical Exam Constitutional:      Appearance: Normal appearance. He is normal weight.  Cardiovascular:     Rate and Rhythm: Normal rate and regular rhythm.     Heart sounds: Normal heart sounds.  Pulmonary:     Effort: Pulmonary effort is normal.     Breath sounds: Normal breath sounds.  Abdominal:     General: Bowel sounds are normal.     Palpations: Abdomen is soft.  Musculoskeletal:        General: Normal range of motion.  Skin:    General: Skin is warm.  Neurological:     Mental Status: He is alert and oriented to person, place, and time. Mental status is at baseline.  Psychiatric:        Mood and Affect: Mood normal.        Behavior: Behavior normal.        Thought Content: Thought content normal.        Judgment: Judgment normal.      LABORATORY DATA:  I have reviewed the labs as listed.  CBC    Component Value Date/Time   WBC 5.2 12/27/2019 1029   RBC 3.94 (L) 12/27/2019 1029   HGB 12.7 (L) 12/27/2019 1029   HCT 40.3 12/27/2019 1029   PLT 175 12/27/2019 1029   MCV 102.3 (H) 12/27/2019 1029   MCH 32.2 12/27/2019 1029   MCHC 31.5 12/27/2019 1029   RDW 15.1 12/27/2019 1029   LYMPHSABS 0.9 12/27/2019 1029   MONOABS 0.6 12/27/2019 1029   EOSABS 0.3 12/27/2019 1029   BASOSABS 0.0 12/27/2019 1029   CMP Latest Ref Rng & Units 12/27/2019 11/21/2019 10/24/2019  Glucose 70 - 99 mg/dL 187(H) 487(H) 442(H)  BUN 8 - 23 mg/dL _0 Creatinine 0.61 - 1.24 mg/dL 0.97  1.06 1.40(H)  Sodium 135 - 145 mmol/L 136 135 136  Potassium 3.5 - 5.1 mmol/L 4.3 4.4 4.3  Chloride 98 - 111 mmol/L 107 97(L) 98  CO2 22 - 32 mmol/L _1 Calcium 8.9 - 10.3 mg/dL 8.3(L) 9.6 9.2  Total Protein 6.5 - 8.1 g/dL 6.3(L) 6.4(L) 6.7  Total Bilirubin 0.3 - 1.2 mg/dL 0.6 0.8 1.0  Alkaline Phos 38 - 126 U/L 61 64 70  AST 15 - 41 U/L _2 ALT 0 - 44 U/L 22 27 32    All questions were answered to patient's stated satisfaction. Encouraged patient to call with any new concerns or questions before his next visit to the cancer center and we can certain see him sooner, if needed.     ASSESSMENT & PLAN:  DLBCL (diffuse large B cell lymphoma) ([place]) 1.  Autoimmune hemolytic anemia: -Prednisone 120 mg daily started on 08/05/2019, rituximab 4 doses from 08/15/2019 through 09/05/2019. -He was last seen on 12/22/2019 and the plan was to cut prednisone to 5 mg daily for 2 weeks followed by 5 mg every other day for the next 2 weeks. -He is currently taking 5 mg every other day. -Labs done on 12/27/2019 showed LDH 167, WBC 5.2, hemoglobin 12.7, ferritin 92, percent saturation 18, MCV 102.3, platelets 175 -We will try to infusions of IV iron -He will continue to  take 5 mg of prednisone every other day. -We will see him back in 3 weeks with repeat labs.  2.  Leg swelling: -He will use Lasix 20 mg daily as needed.  3.  Poorly controlled diabetes: -Glucose today is 187.  He is taking Amaryl 2 mg twice daily. -Patient reports it is hard to control his diabetes with the prednisone.  4.  DLBCL: -Diagnosed in 2006, treated with R-CHOP with complete response on PET scan. -Relapse of lymphoma on 08/31/2005, treated with salvage chemotherapy followed by stem cell transplant on 01/15/2006. -Rituximab weekly x4 followed by every 6 months for 2 years completed on 12/05/2007. -PET scan on 08/14/2019 did not show any evidence or recurrence of lymphoma. -No B symptoms at this time.     Orders placed  this encounter:  Orders Placed This Encounter  Procedures  . Reticulocytes  . Lactate dehydrogenase  . CBC with Differential/Platelet  . Comprehensive metabolic panel  . Vitamin B12  . VITAMIN D 25 Hydroxy (Vit-D Deficiency, Fractures)      Francene Finders, FNP-C [place] [phone]

## 2019-12-28 NOTE — Patient Instructions (Addendum)
Goals Eat three meals per day at times discussed. Take 40 units of Tresiba daily. Take 500 mg of Metformin daily; after breakfast and after dinner Drink only water  Avoid snacks between meals Follow the Plate Method Test blood sugars 4 times per day. Marland Kitchen

## 2020-01-05 ENCOUNTER — Encounter (HOSPITAL_COMMUNITY): Payer: Self-pay

## 2020-01-05 ENCOUNTER — Other Ambulatory Visit: Payer: Self-pay

## 2020-01-05 ENCOUNTER — Inpatient Hospital Stay (HOSPITAL_COMMUNITY): Payer: Medicare Other

## 2020-01-05 VITALS — BP 129/67 | HR 60 | Temp 97.7°F | Resp 18

## 2020-01-05 DIAGNOSIS — D591 Autoimmune hemolytic anemia, unspecified: Secondary | ICD-10-CM | POA: Diagnosis not present

## 2020-01-05 DIAGNOSIS — M7989 Other specified soft tissue disorders: Secondary | ICD-10-CM | POA: Diagnosis not present

## 2020-01-05 DIAGNOSIS — Z7952 Long term (current) use of systemic steroids: Secondary | ICD-10-CM | POA: Diagnosis not present

## 2020-01-05 DIAGNOSIS — D6489 Other specified anemias: Secondary | ICD-10-CM

## 2020-01-05 DIAGNOSIS — E1165 Type 2 diabetes mellitus with hyperglycemia: Secondary | ICD-10-CM | POA: Diagnosis not present

## 2020-01-05 DIAGNOSIS — Z8572 Personal history of non-Hodgkin lymphomas: Secondary | ICD-10-CM | POA: Diagnosis not present

## 2020-01-05 MED ORDER — SODIUM CHLORIDE 0.9 % IV SOLN
510.0000 mg | Freq: Once | INTRAVENOUS | Status: AC
  Administered 2020-01-05: 510 mg via INTRAVENOUS
  Filled 2020-01-05: qty 510

## 2020-01-05 MED ORDER — SODIUM CHLORIDE 0.9 % IV SOLN: Freq: Once | INTRAVENOUS | Status: AC

## 2020-01-05 NOTE — Progress Notes (Signed)
[name] tolerated Feraheme infusion well without complaints or incident. Peripheral IV site checked with positive blood return noted prior to and after infusion. VSS Pt discharged self ambulatory using his cane in satisfactory  condition

## 2020-01-05 NOTE — Patient Instructions (Signed)
[facility] at [facility] Discharge Instructions  Received Feraheme infusion today. Follow-up as scheduled. Call clinic for any questions or concerns   Thank you for choosing [facility] at [facility] to provide your oncology and hematology care.  To afford each patient quality time with our provider, please arrive at least 15 minutes before your scheduled appointment time.   If you have a lab appointment with the Cancer Center please come in thru the Main Entrance and check in at the main information desk.  You need to re-schedule your appointment should you arrive 10 or more minutes late.  We strive to give you quality time with our providers, and arriving late affects you and other patients whose appointments are after yours.  Also, if you no show three or more times for appointments you may be dismissed from the clinic at the providers discretion.     Again, thank you for choosing [facility].  Our hope is that these requests will decrease the amount of time that you wait before being seen by our physicians.       _____________________________________________________________  Should you have questions after your visit to [facility], please contact our office at [phone] between the hours of 8:00 a.m. and 4:30 p.m.  Voicemails left after 4:00 p.m. will not be returned until the following business day.  For prescription refill requests, have your pharmacy contact our office and allow 72 hours.    Due to Covid, you will need to wear a mask upon entering the hospital. If you do not have a mask, a mask will be given to you at the Main Entrance upon arrival. For doctor visits, patients may have 1 support person with them. For treatment visits, patients can not have anyone with them due to social distancing guidelines and our immunocompromised population.     

## 2020-01-12 ENCOUNTER — Other Ambulatory Visit: Payer: Self-pay

## 2020-01-12 ENCOUNTER — Inpatient Hospital Stay (HOSPITAL_COMMUNITY): Payer: Medicare Other

## 2020-01-12 ENCOUNTER — Encounter (HOSPITAL_COMMUNITY): Payer: Self-pay

## 2020-01-12 VITALS — BP 137/66 | HR 66 | Temp 97.1°F | Resp 18

## 2020-01-12 DIAGNOSIS — D6489 Other specified anemias: Secondary | ICD-10-CM

## 2020-01-12 DIAGNOSIS — D591 Autoimmune hemolytic anemia, unspecified: Secondary | ICD-10-CM | POA: Diagnosis not present

## 2020-01-12 DIAGNOSIS — Z8572 Personal history of non-Hodgkin lymphomas: Secondary | ICD-10-CM | POA: Diagnosis not present

## 2020-01-12 DIAGNOSIS — Z7952 Long term (current) use of systemic steroids: Secondary | ICD-10-CM | POA: Diagnosis not present

## 2020-01-12 DIAGNOSIS — M7989 Other specified soft tissue disorders: Secondary | ICD-10-CM | POA: Diagnosis not present

## 2020-01-12 DIAGNOSIS — E1165 Type 2 diabetes mellitus with hyperglycemia: Secondary | ICD-10-CM | POA: Diagnosis not present

## 2020-01-12 MED ORDER — SODIUM CHLORIDE 0.9 % IV SOLN: Freq: Once | INTRAVENOUS | Status: AC

## 2020-01-12 MED ORDER — SODIUM CHLORIDE 0.9 % IV SOLN
510.0000 mg | Freq: Once | INTRAVENOUS | Status: AC
  Administered 2020-01-12: 510 mg via INTRAVENOUS
  Filled 2020-01-12: qty 510

## 2020-01-12 NOTE — Patient Instructions (Signed)
[facility] at [facility]  Discharge Instructions:   _______________________________________________________________  Thank you for choosing [facility] at [facility] to provide your oncology and hematology care.  To afford each patient quality time with our providers, please arrive at least 15 minutes before your scheduled appointment.  You need to re-schedule your appointment if you arrive 10 or more minutes late.  We strive to give you quality time with our providers, and arriving late affects you and other patients whose appointments are after yours.  Also, if you no show three or more times for appointments you may be dismissed from the clinic.  Again, thank you for choosing [facility] at [facility]. Our hope is that these requests will allow you access to exceptional care and in a timely manner. _______________________________________________________________  If you have questions after your visit, please contact our office at [phone] between the hours of 8:30 a.m. and 5:00 p.m. Voicemails left after 4:30 p.m. will not be returned until the following business day. _______________________________________________________________  For prescription refill requests, have your pharmacy contact our office. _______________________________________________________________  Recommendations made by the consultant and any test results will be sent to your referring physician. _______________________________________________________________ 

## 2020-01-12 NOTE — Progress Notes (Signed)
Iron infusion given per orders. Patient tolerated it well without problems. Vitals stable and discharged home from clinic ambulatory. Follow up as scheduled.  

## 2020-01-19 ENCOUNTER — Inpatient Hospital Stay (HOSPITAL_BASED_OUTPATIENT_CLINIC_OR_DEPARTMENT_OTHER): Payer: Medicare Other | Admitting: Nurse Practitioner

## 2020-01-19 ENCOUNTER — Other Ambulatory Visit: Payer: Self-pay

## 2020-01-19 ENCOUNTER — Inpatient Hospital Stay (HOSPITAL_COMMUNITY): Payer: Medicare Other

## 2020-01-19 VITALS — BP 124/61 | HR 82 | Temp 96.6°F | Resp 18 | Wt 274.9 lb

## 2020-01-19 DIAGNOSIS — E1165 Type 2 diabetes mellitus with hyperglycemia: Secondary | ICD-10-CM | POA: Diagnosis not present

## 2020-01-19 DIAGNOSIS — Z7952 Long term (current) use of systemic steroids: Secondary | ICD-10-CM | POA: Diagnosis not present

## 2020-01-19 DIAGNOSIS — Z8572 Personal history of non-Hodgkin lymphomas: Secondary | ICD-10-CM | POA: Diagnosis not present

## 2020-01-19 DIAGNOSIS — M7989 Other specified soft tissue disorders: Secondary | ICD-10-CM | POA: Diagnosis not present

## 2020-01-19 DIAGNOSIS — C8330 Diffuse large B-cell lymphoma, unspecified site: Secondary | ICD-10-CM | POA: Diagnosis not present

## 2020-01-19 DIAGNOSIS — D5919 Other autoimmune hemolytic anemia: Secondary | ICD-10-CM | POA: Diagnosis not present

## 2020-01-19 DIAGNOSIS — D591 Autoimmune hemolytic anemia, unspecified: Secondary | ICD-10-CM | POA: Diagnosis not present

## 2020-01-19 LAB — CBC WITH DIFFERENTIAL/PLATELET
Abs Immature Granulocytes: 0.01 10*3/uL (ref 0.00–0.07)
Basophils Absolute: 0 10*3/uL (ref 0.0–0.1)
Basophils Relative: 1 %
Eosinophils Absolute: 0.2 10*3/uL (ref 0.0–0.5)
Eosinophils Relative: 4 %
HCT: 39.8 % (ref 39.0–52.0)
Hemoglobin: 12.5 g/dL — ABNORMAL LOW (ref 13.0–17.0)
Immature Granulocytes: 0 %
Lymphocytes Relative: 14 %
Lymphs Abs: 0.7 10*3/uL (ref 0.7–4.0)
MCH: 32.8 pg (ref 26.0–34.0)
MCHC: 31.4 g/dL (ref 30.0–36.0)
MCV: 104.5 fL — ABNORMAL HIGH (ref 80.0–100.0)
Monocytes Absolute: 0.8 10*3/uL (ref 0.1–1.0)
Monocytes Relative: 16 %
Neutro Abs: 3.3 10*3/uL (ref 1.7–7.7)
Neutrophils Relative %: 65 %
Platelets: 187 10*3/uL (ref 150–400)
RBC: 3.81 MIL/uL — ABNORMAL LOW (ref 4.22–5.81)
RDW: 16.2 % — ABNORMAL HIGH (ref 11.5–15.5)
WBC: 5.1 10*3/uL (ref 4.0–10.5)
nRBC: 0 % (ref 0.0–0.2)

## 2020-01-19 LAB — VITAMIN D 25 HYDROXY (VIT D DEFICIENCY, FRACTURES): Vit D, 25-Hydroxy: 25 ng/mL — ABNORMAL LOW (ref 30–100)

## 2020-01-19 LAB — VITAMIN B12: Vitamin B-12: 497 pg/mL (ref 180–914)

## 2020-01-19 LAB — COMPREHENSIVE METABOLIC PANEL
ALT: 23 U/L (ref 0–44)
AST: 19 U/L (ref 15–41)
Albumin: 3.5 g/dL (ref 3.5–5.0)
Alkaline Phosphatase: 60 U/L (ref 38–126)
Anion gap: 11 (ref 5–15)
BUN: 10 mg/dL (ref 8–23)
CO2: 25 mmol/L (ref 22–32)
Calcium: 9 mg/dL (ref 8.9–10.3)
Chloride: 105 mmol/L (ref 98–111)
Creatinine, Ser: 0.86 mg/dL (ref 0.61–1.24)
GFR calc Af Amer: >60 mL/min (ref >60)
GFR calc non Af Amer: >60 mL/min (ref >60)
Glucose, Bld: 104 mg/dL — ABNORMAL HIGH (ref 70–99)
Potassium: 3.6 mmol/L (ref 3.5–5.1)
Sodium: 141 mmol/L (ref 135–145)
Total Bilirubin: 0.5 mg/dL (ref 0.3–1.2)
Total Protein: 6.4 g/dL — ABNORMAL LOW (ref 6.5–8.1)

## 2020-01-19 LAB — RETICULOCYTES
Immature Retic Fract: 27.9 % — ABNORMAL HIGH (ref 2.3–15.9)
RBC.: 3.83 MIL/uL — ABNORMAL LOW (ref 4.22–5.81)
Retic Count, Absolute: 124.1 10*3/uL (ref 19.0–186.0)
Retic Ct Pct: 3.2 % — ABNORMAL HIGH (ref 0.4–3.1)

## 2020-01-19 LAB — LACTATE DEHYDROGENASE: LDH: 167 U/L (ref 98–192)

## 2020-01-19 NOTE — Assessment & Plan Note (Signed)
1.  Autoimmune hemolytic anemia: -Prednisone 120 mg daily started on 08/05/2019, rituximab 4 doses from 08/15/2019 through 09/05/2019. -He was last seen on 12/22/2019 and the plan was to cut prednisone to 5 mg daily for 2 weeks followed by 5 mg every other day for the next 2 weeks. -He is currently taking 5 mg every other day. -We gave 2 IV iron infusions on 01/05/2020 and 01/12/2020 due to his ferritin being 92% saturation 18. -Labs done on 01/19/2020 showed LDH 167, hemoglobin 12.5, MCV 104.5, platelets 187, reticulocyte 3.2 -He will start 2.5 mg of prednisone every other day. -We will see him back in 3 weeks with repeat labs.  2.  Leg swelling: -He will use Lasix 20 mg daily as needed.  3.  Poorly controlled diabetes: -Glucose today is 187.  He is taking Amaryl 2 mg twice daily. -Patient reports it is hard to control his diabetes with the prednisone.  4.  DLBCL: -Diagnosed in 2006, treated with R-CHOP with complete response on PET scan. -Relapse of lymphoma on 08/31/2005, treated with salvage chemotherapy followed by stem cell transplant on 01/15/2006. -Rituximab weekly x4 followed by every 6 months for 2 years completed on 12/05/2007. -PET scan on 08/14/2019 did not show any evidence or recurrence of lymphoma. -No B symptoms at this time.

## 2020-01-19 NOTE — Progress Notes (Signed)
[address], [place] [address]   CLINIC:  Medical Oncology/Hematology  PCP:  [facility], [place] [address] [phone]   REASON FOR VISIT: Follow-up for autoimmune hemolytic anemia   CURRENT THERAPY: Prednisone 2.5 mg every other day  BRIEF ONCOLOGIC HISTORY:  Oncology History Overview Note  S/P chemotherapy following stabilization of the spine.  Initial biopsy was on 07/13/2005 on his bone marrow.  Thoracic vertebra was biopsied on 02/04/2005 and mediastinum on 02/03/2005.  S/P R-CHOP and achieved an incomplete response, then underwent ICE chemotherapy pre-transplant and total body radiation with autologous stem cell transplant on 01/15/2006 and maintained on Rituxan for 2 years.  Thus far without recurrence.   DLBCL (diffuse large B cell lymphoma) ([place])  01/27/2005 Imaging   PET- diffuse activity  involving the L cervical area, extensive activity in the mediastinumn particularly in the R paratracheal area and to a lesser degree in the anterior mediastinum.  Lg area in porta caval nodes in upper abd and diffuse bone activity   02/03/2005 Pathology Results   Mediastinal lymph node demonstrating diffuse large cell lymphoma, B-type.  Vertebral bone biopsy demonstrating diffuse large b-cell lymphoma   03/11/2005 - 08/04/2005 Chemotherapy   R-CHOP x 8 cycles   05/04/2005 Imaging   PET- near resolution of previously identified uptake.   07/13/2005 Bone Marrow Biopsy   Bone marrow aspiration and biopsy is negative for lymphoma   08/31/2005 Imaging   PET- further response to therapy with findings consistent with residual disease.  Hypermetabolic activity identified within the anterior mediastinal lymph node (smaller but still pathologic).  Borderline hypermetabolic small right pleural effusion   10/20/2005 - 11/13/2005 Chemotherapy   R-[facility] x 2 [facility] at [facility]   01/08/2006 - 01/09/2006 Chemotherapy   Cyclophosphamide 600 mg  daily x 2   01/10/2006 - 01/14/2006 Radiation Therapy   Total body radiation twice daily   01/15/2006 Bone Marrow Transplant   Stem cell transplant at [facility]   04/05/2006 - 12/05/2007 Chemotherapy   Rituxan weekly x 4 every 6 months x 2 years     CANCER STAGING: Cancer Staging DLBCL (diffuse large B cell lymphoma) (HCC) Staging form: Lymphoid Neoplasms, AJCC 6th Edition - Clinical: Stage IV - Signed by Baird Cancer, PA on 04/29/2011    INTERVAL HISTORY:  [name] 69 y.o. male returns for routine follow-up for autoimmune hemolytic anemia. Patient reports he has been doing well since last visit. He does report he is titrating his insulin down now that his prednisone doses are decreasing. He reports he is feeling better. He reports less swelling. Denies any nausea, vomiting, or diarrhea. Denies any new pains. Had not noticed any recent bleeding such as epistaxis, hematuria or hematochezia. Denies recent chest pain on exertion, shortness of breath on minimal exertion, pre-syncopal episodes, or palpitations. Denies any numbness or tingling in hands or feet. Denies any recent fevers, infections, or recent hospitalizations. Patient reports appetite at 75% and energy level at 75%. He is eating well maintain his weight at this time.    REVIEW OF SYSTEMS:  Review of Systems  Respiratory: Positive for shortness of breath.   Cardiovascular: Positive for leg swelling.  All other systems reviewed and are negative.    PAST MEDICAL/SURGICAL HISTORY:  Past Medical History:  Diagnosis Date  . Asthma    as child  . Diabetes mellitus without complication ([place])   . DLBCL (diffuse large B cell lymphoma) ([place]) 02/28/2009  . Edema   .  Heart failure, diastolic, chronic ([place])    patient denies  . Hyperlipidemia, mixed   . Hypertension   . IDA (iron deficiency anemia)   . Large cell lymphoma ([place]) 01/2005   autologous stem cell transplant 12/2005  . Obesity, morbid (more than 100 lbs over ideal  weight or BMI > 40) (HCC)   . Venous insufficiency 04/29/2011   Past Surgical History:  Procedure Laterality Date  . BACK SURGERY  2006   T6 vertebrae removed/titanium placed  . CATARACT EXTRACTION W/PHACO Right 03/31/2019   Procedure: CATARACT EXTRACTION PHACO AND INTRAOCULAR LENS PLACEMENT (IOC);  Surgeon: Baruch Goldmann, MD;  Location: [facility];  Service: Ophthalmology;  Laterality: Right;  CDE: 3.21  . CATARACT EXTRACTION W/PHACO Left 04/14/2019   Procedure: CATARACT EXTRACTION PHACO AND INTRAOCULAR LENS PLACEMENT (IOC);  Surgeon: Baruch Goldmann, MD;  Location: [facility];  Service: Ophthalmology;  Laterality: Left;  left - pt knows to arrive at 7:45, CDE: 3.55  . COLONOSCOPY  11/24/2004   Polyps in the left colon ablated/removed as described above.  Two submucosal lesions consistent with lipomas as described above not  manipulated./ Normal rectum  . COLONOSCOPY  06/20/2012   MULTIPLE RECTAL AND COLONIC POLYPS  . COLONOSCOPY N/A 03/08/2015   RMR: Capacious, redundant colon. Multiple colonic and rectosigmoid polyps removed. ablated as described above. colonic lipoma abnormal appearing terminal ileum likely a variant of normal). Howeverwith history  biopsies obtained.   . ESOPHAGOGASTRODUODENOSCOPY N/A 03/08/2015   RMR: Hiatal hernia Polypoid gastric mucosa with multiple gastric polyps. largest polyp removed via snare polypectomgy hemostasis clip placed at base. Status post gastric biopsy. Status post video capsule placement.   Marland Kitchen GIVENS CAPSULE STUDY N/A 03/08/2015   Procedure: GIVENS CAPSULE STUDY;  Surgeon: Daneil Dolin, MD;  Location: [facility];  Service: Endoscopy;  Laterality: N/A;  . LIMBAL STEM CELL TRANSPLANT    . LUNG BIOPSY  6/06  . MULTIPLE TOOTH EXTRACTIONS  04/2005  . port a cath placement    . PORT-A-CATH REMOVAL  09/08/2012   Procedure: REMOVAL PORT-A-CATH;  Surgeon: Melrose Nakayama, MD;  Location: [place];  Service: Thoracic;  Laterality: N/A;     SOCIAL HISTORY:  Social  History   Socioeconomic History  . Marital status: Married    Spouse name: Not on file  . Number of children: 2  . Years of education: Not on file  . Highest education level: Not on file  Occupational History  . Occupation: disability due to back    Employer: UNEMPLOYED  Tobacco Use  . Smoking status: Former Smoker    Packs/day: 0.50    Years: 15.00    Pack years: 7.50    Quit date: 11/11/2002    Years since quitting: 17.2  . Smokeless tobacco: Never Used  Substance and Sexual Activity  . Alcohol use: No    Alcohol/week: 0.0 standard drinks  . Drug use: No  . Sexual activity: Yes    Birth control/protection: None  Other Topics Concern  . Not on file  Social History Narrative   Married   No regular exercise   Social Determinants of Health   Financial Resource Strain:   . Difficulty of Paying Living Expenses:   Food Insecurity:   . Worried About Charity fundraiser in the Last Year:   . Arboriculturist in the Last Year:   Transportation Needs:   . Film/video editor (Medical):   Marland Kitchen Lack of Transportation (Non-Medical):   Physical Activity:   .  Days of Exercise per Week:   . Minutes of Exercise per Session:   Stress:   . Feeling of Stress :   Social Connections:   . Frequency of Communication with Friends and Family:   . Frequency of Social Gatherings with Friends and Family:   . Attends Religious Services:   . Active Member of Clubs or Organizations:   . Attends Archivist Meetings:   Marland Kitchen Marital Status:   Intimate Partner Violence:   . Fear of Current or Ex-Partner:   . Emotionally Abused:   Marland Kitchen Physically Abused:   . Sexually Abused:     FAMILY HISTORY:  Family History  Problem Relation Age of Onset  . Cancer Mother        lung  . Hypertension Mother   . Hyperlipidemia Mother   . Cancer Father        prostate  . Hypertension Father   . Colon cancer Neg Hx   . Diabetes Neg Hx     CURRENT MEDICATIONS:  Outpatient Encounter Medications  as of 01/19/2020  Medication Sig  . apixaban (ELIQUIS) 5 MG TABS tablet Take 1 tablet (5 mg total) by mouth 2 (two) times daily.  . Blood Glucose Monitoring Suppl (ONE TOUCH ULTRA 2) w/Device KIT USE TO TEST BLOOD SUGAR ONCE D UTD  . ergocalciferol (VITAMIN D2) 1.25 MG (50000 UT) capsule Take 1 capsule (50,000 Units total) by mouth once a week.  . folic acid (FOLVITE) 1 MG tablet TAKE 1 TABLET(1 MG) BY MOUTH DAILY  . glimepiride (AMARYL) 1 MG tablet Take 1 tablet (1 mg total) by mouth 2 (two) times daily with a meal. take 1 tablet by mouth twice daily WITH BREAKFAST  . insulin degludec (TRESIBA FLEXTOUCH) 100 UNIT/ML FlexTouch Pen Inject 0.4 mLs (40 Units total) into the skin at bedtime.  . Insulin Pen Needle (B-D ULTRAFINE III SHORT PEN) 31G X 8 MM MISC 1 each by Does not apply route as directed.  . Lancets (ONETOUCH DELICA PLUS [id number]) MISC USE TO TEST BLOOD SUGAR ONCE D  . metFORMIN (GLUCOPHAGE) 500 MG tablet Take 1 tablet (500 mg total) by mouth 2 (two) times daily with a meal.  . ONETOUCH ULTRA test strip USE TO TEST BLOOD SUGAR  4 TIMES A DAY  . predniSONE (DELTASONE) 5 MG tablet Take one tablet PO every other day  . rosuvastatin (CRESTOR) 40 MG tablet Take 40 mg by mouth daily.  . clotrimazole-betamethasone (LOTRISONE) cream Apply 1 application topically 2 (two) times daily as needed (leg infection).   . furosemide (LASIX) 20 MG tablet Take 20 mg by mouth daily as needed for fluid or edema.   . metoprolol tartrate (LOPRESSOR) 25 MG tablet Take 1 tablet (25 mg total) by mouth 2 (two) times daily.   No facility-administered encounter medications on file as of 01/19/2020.    ALLERGIES:  Allergies  Allergen Reactions  . Penicillins Rash    Has patient had a PCN reaction causing immediate rash, facial/tongue/throat swelling, SOB or lightheadedness with hypotension: Unknown Has patient had a PCN reaction causing severe rash involving mucus membranes or skin necrosis: Unknown Has patient  had a PCN reaction that required hospitalization: Unknown Has patient had a PCN reaction occurring within the last 10 years: No If all of the above answers are "NO", then may proceed with Cephalosporin use.      PHYSICAL EXAM:  ECOG Performance status: 1  Vitals:   01/19/20 1024  BP: 124/61  Pulse: 82  Resp: 18  Temp: (!) 96.6 F (35.9 C)  SpO2: 98%   Filed Weights   01/19/20 1024  Weight: 274 lb 14.4 oz (124.7 kg)   Physical Exam Constitutional:      Appearance: Normal appearance. He is normal weight.  Cardiovascular:     Rate and Rhythm: Normal rate and regular rhythm.     Heart sounds: Normal heart sounds.  Pulmonary:     Effort: Pulmonary effort is normal.     Breath sounds: Normal breath sounds.  Abdominal:     General: Bowel sounds are normal.     Palpations: Abdomen is soft.  Musculoskeletal:        General: Normal range of motion.  Skin:    General: Skin is warm.  Neurological:     Mental Status: He is alert and oriented to person, place, and time. Mental status is at baseline.  Psychiatric:        Mood and Affect: Mood normal.        Behavior: Behavior normal.        Thought Content: Thought content normal.        Judgment: Judgment normal.      LABORATORY DATA:  I have reviewed the labs as listed.  CBC    Component Value Date/Time   WBC 5.1 01/19/2020 0947   RBC 3.83 (L) 01/19/2020 0947   RBC 3.81 (L) 01/19/2020 0947   HGB 12.5 (L) 01/19/2020 0947   HCT 39.8 01/19/2020 0947   PLT 187 01/19/2020 0947   MCV 104.5 (H) 01/19/2020 0947   MCH 32.8 01/19/2020 0947   MCHC 31.4 01/19/2020 0947   RDW 16.2 (H) 01/19/2020 0947   LYMPHSABS 0.7 01/19/2020 0947   MONOABS 0.8 01/19/2020 0947   EOSABS 0.2 01/19/2020 0947   BASOSABS 0.0 01/19/2020 0947   CMP Latest Ref Rng & Units 01/19/2020 12/27/2019 11/21/2019  Glucose 70 - 99 mg/dL 104(H) 187(H) 487(H)  BUN 8 - 23 mg/dL _0 Creatinine 0.61 - 1.24 mg/dL 0.86 0.97 1.06  Sodium 135 - 145 mmol/L 141  136 135  Potassium 3.5 - 5.1 mmol/L 3.6 4.3 4.4  Chloride 98 - 111 mmol/L 105 107 97(L)  CO2 22 - 32 mmol/L _1 Calcium 8.9 - 10.3 mg/dL 9.0 8.3(L) 9.6  Total Protein 6.5 - 8.1 g/dL 6.4(L) 6.3(L) 6.4(L)  Total Bilirubin 0.3 - 1.2 mg/dL 0.5 0.6 0.8  Alkaline Phos 38 - 126 U/L 60 61 64  AST 15 - 41 U/L _2 ALT 0 - 44 U/L _3 All questions were answered to patient's stated satisfaction. Encouraged patient to call with any new concerns or questions before his next visit to the cancer center and we can certain see him sooner, if needed.     ASSESSMENT & PLAN:  DLBCL (diffuse large B cell lymphoma) ([place]) 1.  Autoimmune hemolytic anemia: -Prednisone 120 mg daily started on 08/05/2019, rituximab 4 doses from 08/15/2019 through 09/05/2019. -He was last seen on 12/22/2019 and the plan was to cut prednisone to 5 mg daily for 2 weeks followed by 5 mg every other day for the next 2 weeks. -He is currently taking 5 mg every other day. -We gave 2 IV iron infusions on 01/05/2020 and 01/12/2020 due to his ferritin being 92% saturation 18. -Labs done on 01/19/2020 showed LDH 167, hemoglobin 12.5, MCV 104.5, platelets 187, reticulocyte 3.2 -He will start 2.5 mg of prednisone every other day. -We will  see him back in 3 weeks with repeat labs.  2.  Leg swelling: -He will use Lasix 20 mg daily as needed.  3.  Poorly controlled diabetes: -Glucose today is 187.  He is taking Amaryl 2 mg twice daily. -Patient reports it is hard to control his diabetes with the prednisone.  4.  DLBCL: -Diagnosed in 2006, treated with R-CHOP with complete response on PET scan. -Relapse of lymphoma on 08/31/2005, treated with salvage chemotherapy followed by stem cell transplant on 01/15/2006. -Rituximab weekly x4 followed by every 6 months for 2 years completed on 12/05/2007. -PET scan on 08/14/2019 did not show any evidence or recurrence of lymphoma. -No B symptoms at this time.     Orders placed this  encounter:  Orders Placed This Encounter  Procedures  . Lactate dehydrogenase  . CBC with Differential/Platelet  . Comprehensive metabolic panel  . Ferritin  . Iron and TIBC  . Vitamin B12  . VITAMIN D 25 Hydroxy (Vit-D Deficiency, Fractures)  . Folate  . Methylmalonic acid, serum  . Reticulocytes  . Direct antiglobulin test (not at [facility])      Francene Finders, [place] [phone]

## 2020-01-22 DIAGNOSIS — Z87891 Personal history of nicotine dependence: Secondary | ICD-10-CM | POA: Diagnosis not present

## 2020-01-22 DIAGNOSIS — I2510 Atherosclerotic heart disease of native coronary artery without angina pectoris: Secondary | ICD-10-CM | POA: Diagnosis not present

## 2020-01-22 DIAGNOSIS — E1129 Type 2 diabetes mellitus with other diabetic kidney complication: Secondary | ICD-10-CM | POA: Diagnosis not present

## 2020-01-22 DIAGNOSIS — I4891 Unspecified atrial fibrillation: Secondary | ICD-10-CM | POA: Diagnosis not present

## 2020-01-23 ENCOUNTER — Encounter: Payer: Self-pay | Admitting: Internal Medicine

## 2020-01-24 ENCOUNTER — Ambulatory Visit: Payer: Medicare Other | Admitting: "Endocrinology

## 2020-01-24 ENCOUNTER — Ambulatory Visit: Payer: Medicare Other | Admitting: Nutrition

## 2020-01-25 ENCOUNTER — Other Ambulatory Visit: Payer: Self-pay

## 2020-01-25 ENCOUNTER — Encounter: Payer: Medicare Other | Attending: "Endocrinology | Admitting: Nutrition

## 2020-01-25 ENCOUNTER — Ambulatory Visit: Payer: Medicare Other | Admitting: "Endocrinology

## 2020-01-25 ENCOUNTER — Encounter: Payer: Self-pay | Admitting: Nutrition

## 2020-01-25 ENCOUNTER — Encounter: Payer: Self-pay | Admitting: "Endocrinology

## 2020-01-25 VITALS — BP 132/79 | HR 82 | Ht 71.0 in | Wt 277.8 lb

## 2020-01-25 DIAGNOSIS — E559 Vitamin D deficiency, unspecified: Secondary | ICD-10-CM | POA: Insufficient documentation

## 2020-01-25 DIAGNOSIS — I10 Essential (primary) hypertension: Secondary | ICD-10-CM | POA: Diagnosis not present

## 2020-01-25 DIAGNOSIS — E782 Mixed hyperlipidemia: Secondary | ICD-10-CM | POA: Insufficient documentation

## 2020-01-25 DIAGNOSIS — E1165 Type 2 diabetes mellitus with hyperglycemia: Secondary | ICD-10-CM | POA: Insufficient documentation

## 2020-01-25 DIAGNOSIS — Z6836 Body mass index (BMI) 36.0-36.9, adult: Secondary | ICD-10-CM | POA: Diagnosis present

## 2020-01-25 MED ORDER — TRESIBA FLEXTOUCH 100 UNIT/ML ~~LOC~~ SOPN: 35.0000 [IU] | PEN_INJECTOR | Freq: Every day | SUBCUTANEOUS | 2 refills | Status: DC

## 2020-01-25 MED ORDER — BD PEN NEEDLE SHORT U/F 31G X 8 MM MISC: 1.0000 | Freq: Every day | 3 refills | Status: DC

## 2020-01-25 NOTE — Progress Notes (Signed)
01/25/2020, 6:09 PM  Endocrinology follow-up note   Subjective:    Patient ID: [name], male    DOB: 23-Dec-1950.  [name] is being seen in follow-up after he was seen in consultation for management of currently uncontrolled symptomatic diabetes requested by  Redmond School, MD.   Past Medical History:  Diagnosis Date  . Asthma    as child  . Diabetes mellitus without complication ([place])   . DLBCL (diffuse large B cell lymphoma) ([facility]) 02/28/2009  . Edema   . Heart failure, diastolic, chronic ([place])    patient denies  . Hyperlipidemia, mixed   . Hypertension   . IDA (iron deficiency anemia)   . Large cell lymphoma ([place]) 01/2005   autologous stem cell transplant 12/2005  . Obesity, morbid (more than 100 lbs over ideal weight or BMI > 40) ([facility])   . Venous insufficiency 04/29/2011    Past Surgical History:  Procedure Laterality Date  . BACK SURGERY  2006   T6 vertebrae removed/titanium placed  . CATARACT EXTRACTION W/PHACO Right 03/31/2019   Procedure: CATARACT EXTRACTION PHACO AND INTRAOCULAR LENS PLACEMENT (IOC);  Surgeon: Baruch Goldmann, MD;  Location: [facility];  Service: Ophthalmology;  Laterality: Right;  CDE: 3.21  . CATARACT EXTRACTION W/PHACO Left 04/14/2019   Procedure: CATARACT EXTRACTION PHACO AND INTRAOCULAR LENS PLACEMENT (IOC);  Surgeon: Baruch Goldmann, MD;  Location: [facility];  Service: Ophthalmology;  Laterality: Left;  left - pt knows to arrive at 7:45, CDE: 3.55  . COLONOSCOPY  11/24/2004   Polyps in the left colon ablated/removed as described above.  Two submucosal lesions consistent with lipomas as described above not  manipulated./ Normal rectum  . COLONOSCOPY  06/20/2012   MULTIPLE RECTAL AND COLONIC POLYPS  . COLONOSCOPY N/A 03/08/2015   RMR: Capacious, redundant colon. Multiple colonic and rectosigmoid polyps removed. ablated as described above. colonic lipoma abnormal appearing terminal  ileum likely a variant of normal). Howeverwith history  biopsies obtained.   . ESOPHAGOGASTRODUODENOSCOPY N/A 03/08/2015   RMR: Hiatal hernia Polypoid gastric mucosa with multiple gastric polyps. largest polyp removed via snare polypectomgy hemostasis clip placed at base. Status post gastric biopsy. Status post video capsule placement.   Marland Kitchen GIVENS CAPSULE STUDY N/A 03/08/2015   Procedure: GIVENS CAPSULE STUDY;  Surgeon: Daneil Dolin, MD;  Location: [facility] SUITE;  Service: Endoscopy;  Laterality: N/A;  . LIMBAL STEM CELL TRANSPLANT    . LUNG BIOPSY  6/06  . MULTIPLE TOOTH EXTRACTIONS  04/2005  . port a cath placement    . PORT-A-CATH REMOVAL  09/08/2012   Procedure: REMOVAL PORT-A-CATH;  Surgeon: Melrose Nakayama, MD;  Location: [facility];  Service: Thoracic;  Laterality: N/A;    Social History   Socioeconomic History  . Marital status: Married    Spouse name: Not on file  . Number of children: 2  . Years of education: Not on file  . Highest education level: Not on file  Occupational History  . Occupation: disability due to back    Employer: UNEMPLOYED  Tobacco Use  . Smoking status: Former Smoker    Packs/day: 0.50    Years: 15.00    Pack years: 7.50  Quit date: 11/11/2002    Years since quitting: 17.2  . Smokeless tobacco: Never Used  Substance and Sexual Activity  . Alcohol use: No    Alcohol/week: 0.0 standard drinks  . Drug use: No  . Sexual activity: Yes    Birth control/protection: None  Other Topics Concern  . Not on file  Social History Narrative   Married   No regular exercise   Social Determinants of Health   Financial Resource Strain:   . Difficulty of Paying Living Expenses:   Food Insecurity:   . Worried About Charity fundraiser in the Last Year:   . Arboriculturist in the Last Year:   Transportation Needs:   . Film/video editor (Medical):   Marland Kitchen Lack of Transportation (Non-Medical):   Physical Activity:   . Days of Exercise per Week:   . Minutes  of Exercise per Session:   Stress:   . Feeling of Stress :   Social Connections:   . Frequency of Communication with Friends and Family:   . Frequency of Social Gatherings with Friends and Family:   . Attends Religious Services:   . Active Member of Clubs or Organizations:   . Attends Archivist Meetings:   Marland Kitchen Marital Status:     Family History  Problem Relation Age of Onset  . Cancer Mother        lung  . Hypertension Mother   . Hyperlipidemia Mother   . Cancer Father        prostate  . Hypertension Father   . Colon cancer Neg Hx   . Diabetes Neg Hx     Outpatient Encounter Medications as of 01/25/2020  Medication Sig  . apixaban (ELIQUIS) 5 MG TABS tablet Take 1 tablet (5 mg total) by mouth 2 (two) times daily.  . Blood Glucose Monitoring Suppl (ONE TOUCH ULTRA 2) w/Device KIT USE TO TEST BLOOD SUGAR ONCE D UTD  . clotrimazole-betamethasone (LOTRISONE) cream Apply 1 application topically 2 (two) times daily as needed (leg infection).   Marland Kitchen ergocalciferol (VITAMIN D2) 1.25 MG (50000 UT) capsule Take 1 capsule (50,000 Units total) by mouth once a week.  . folic acid (FOLVITE) 1 MG tablet TAKE 1 TABLET(1 MG) BY MOUTH DAILY  . furosemide (LASIX) 20 MG tablet Take 20 mg by mouth daily as needed for fluid or edema.   . insulin degludec (TRESIBA FLEXTOUCH) 100 UNIT/ML FlexTouch Pen Inject 0.35 mLs (35 Units total) into the skin at bedtime.  . Lancets (ONETOUCH DELICA PLUS [id number]) MISC USE TO TEST BLOOD SUGAR ONCE D  . metFORMIN (GLUCOPHAGE) 500 MG tablet Take 1 tablet (500 mg total) by mouth 2 (two) times daily with a meal.  . metoprolol tartrate (LOPRESSOR) 25 MG tablet Take 1 tablet (25 mg total) by mouth 2 (two) times daily.  Glory Rosebush ULTRA test strip USE TO TEST BLOOD SUGAR  4 TIMES A DAY  . predniSONE (DELTASONE) 5 MG tablet Take one tablet PO every other day  . rosuvastatin (CRESTOR) 40 MG tablet Take 40 mg by mouth daily.  . [DISCONTINUED] glimepiride (AMARYL) 1 MG  tablet Take 1 tablet (1 mg total) by mouth 2 (two) times daily with a meal. take 1 tablet by mouth twice daily WITH BREAKFAST  . [DISCONTINUED] insulin degludec (TRESIBA FLEXTOUCH) 100 UNIT/ML FlexTouch Pen Inject 0.4 mLs (40 Units total) into the skin at bedtime.  . [DISCONTINUED] Insulin Pen Needle (B-D ULTRAFINE III SHORT PEN) 31G X 8 MM  MISC 1 each by Does not apply route as directed.   No facility-administered encounter medications on file as of 01/25/2020.    ALLERGIES: Allergies  Allergen Reactions  . Penicillins Rash    Has patient had a PCN reaction causing immediate rash, facial/tongue/throat swelling, SOB or lightheadedness with hypotension: Unknown Has patient had a PCN reaction causing severe rash involving mucus membranes or skin necrosis: Unknown Has patient had a PCN reaction that required hospitalization: Unknown Has patient had a PCN reaction occurring within the last 10 years: No If all of the above answers are "NO", then may proceed with Cephalosporin use.     VACCINATION STATUS: Immunization History  Administered Date(s) Administered  . Fluad Quad(high Dose 65+) 08/04/2019  . Influenza Split 06/08/2012  . Influenza,inj,Quad PF,6+ Mos 07/14/2013, 06/20/2015  . Moderna SARS-COVID-2 Vaccination 11/03/2019, 12/05/2019    Diabetes He presents for his follow-up diabetic visit. He has type 2 diabetes mellitus. Onset time: He was diagnosed in March 2021 after at least 2 years of exposure to steroids related to his B-cell lymphoma which required autologous stem cell transplant bone marrow. His disease course has been improving. There are no hypoglycemic associated symptoms. Pertinent negatives for hypoglycemia include no confusion, headaches, pallor or seizures. Associated symptoms include blurred vision. Pertinent negatives for diabetes include no chest pain, no fatigue, no polydipsia, no polyphagia, no polyuria and no weakness. There are no hypoglycemic complications.  Symptoms are improving. Diabetic complications include PVD. Risk factors for coronary artery disease include diabetes mellitus, dyslipidemia, family history, male sex, obesity, tobacco exposure and sedentary lifestyle. Current diabetic treatments: He is currently on glimepiride 1 mg twice a day. His weight is increasing steadily. He is following a generally unhealthy diet. When asked about meal planning, he reported none. He has not had a previous visit with a dietitian. He rarely participates in exercise. His home blood glucose trend is decreasing steadily. His breakfast blood glucose range is generally 140-180 mg/dl. His lunch blood glucose range is generally 140-180 mg/dl. His dinner blood glucose range is generally 140-180 mg/dl. His bedtime blood glucose range is generally 140-180 mg/dl. His overall blood glucose range is 140-180 mg/dl. (He presents with significant improvement in his glycemic profile was fasting and postprandial.  His average blood glucose is 146-156 for the last 30 days.  This is despite his recent A1c of   >15.5% at end of March 2021. ) An ACE inhibitor/angiotensin II receptor blocker is not being taken. Eye exam is current.  Hyperlipidemia This is a chronic problem. The current episode started more than 1 year ago. The problem is controlled. Exacerbating diseases include diabetes and obesity. Factors aggravating his hyperlipidemia include beta blockers. Pertinent negatives include no chest pain, myalgias or shortness of breath. Current antihyperlipidemic treatment includes statins. Risk factors for coronary artery disease include dyslipidemia, diabetes mellitus, family history, obesity, male sex, hypertension and a sedentary lifestyle.  Hypertension This is a chronic problem. Associated symptoms include blurred vision. Pertinent negatives include no chest pain, headaches, neck pain, palpitations or shortness of breath. Risk factors for coronary artery disease include dyslipidemia,  diabetes mellitus, male gender, obesity, sedentary lifestyle, smoking/tobacco exposure and family history. Past treatments include beta blockers. Hypertensive end-organ damage includes PVD.    Review of Systems  Constitutional: Negative for chills, fatigue, fever and unexpected weight change.  HENT: Negative for dental problem, mouth sores and trouble swallowing.   Eyes: Positive for blurred vision. Negative for visual disturbance.  Respiratory: Negative for cough, choking, chest tightness,  shortness of breath and wheezing.   Cardiovascular: Negative for chest pain, palpitations and leg swelling.  Gastrointestinal: Negative for abdominal distention, abdominal pain, constipation, diarrhea, nausea and vomiting.  Endocrine: Negative for polydipsia, polyphagia and polyuria.  Genitourinary: Negative for dysuria, flank pain, hematuria and urgency.  Musculoskeletal: Positive for gait problem. Negative for back pain, myalgias and neck pain.  Skin: Positive for color change and wound. Negative for pallor and rash.  Neurological: Negative for seizures, syncope, weakness, numbness and headaches.  Psychiatric/Behavioral: Negative.  Negative for confusion and dysphoric mood.    Objective:    Vitals with BMI 01/25/2020 01/19/2020 01/12/2020  Height 5' 11"  - -  Weight 277 lbs 13 oz 274 lbs 14 oz -  BMI 82.51 - -  Systolic [phone]  Diastolic 79 61 66  Pulse 82 82 66    BP 132/79   Pulse 82   Ht 5' 11"  (1.803 m)   Wt 277 lb 12.8 oz (126 kg)   BMI 38.75 kg/m   Wt Readings from Last 3 Encounters:  01/25/20 277 lb 12.8 oz (126 kg)  01/19/20 274 lb 14.4 oz (124.7 kg)  12/28/19 267 lb 14.4 oz (121.5 kg)     Physical Exam Constitutional:      General: He is not in acute distress.    Appearance: He is well-developed.  HENT:     Head: Normocephalic and atraumatic.  Neck:     Thyroid: No thyromegaly.     Trachea: No tracheal deviation.  Cardiovascular:     Rate and Rhythm: Normal rate.      Pulses:          Dorsalis pedis pulses are 1+ on the right side and 1+ on the left side.       Posterior tibial pulses are 1+ on the right side and 1+ on the left side.     Heart sounds: Normal heart sounds, S1 normal and S2 normal. No murmur. No gallop.   Pulmonary:     Effort: Pulmonary effort is normal. No respiratory distress.     Breath sounds: No wheezing.  Abdominal:     General: Bowel sounds are normal. There is no distension.     Palpations: Abdomen is soft.     Tenderness: There is no abdominal tenderness. There is no guarding.  Musculoskeletal:        General: Swelling present.     Right shoulder: No swelling or deformity.     Cervical back: Normal range of motion and neck supple.     Right lower leg: Edema present.     Left lower leg: Edema present.     Comments: Walks with a cane, patient with massive bilateral lower extremity lymphedema.  He was treated recently with superficial superimposed cellulitis.    Skin:    General: Skin is warm and dry.     Findings: Lesion present. No rash.     Nails: There is no clubbing.  Neurological:     Mental Status: He is alert and oriented to person, place, and time.     Cranial Nerves: No cranial nerve deficit.     Sensory: No sensory deficit.     Gait: Gait normal.     Deep Tendon Reflexes: Reflexes are normal and symmetric.  Psychiatric:        Speech: Speech normal.        Behavior: Behavior normal. Behavior is cooperative.        Thought Content: Thought content normal.  Judgment: Judgment normal.     CMP ( most recent) CMP     Component Value Date/Time   NA 141 01/19/2020 0947   K 3.6 01/19/2020 0947   CL 105 01/19/2020 0947   CO2 25 01/19/2020 0947   GLUCOSE 104 (H) 01/19/2020 0947   BUN 10 01/19/2020 0947   CREATININE 0.86 01/19/2020 0947   CALCIUM 9.0 01/19/2020 0947   PROT 6.4 (L) 01/19/2020 0947   ALBUMIN 3.5 01/19/2020 0947   AST 19 01/19/2020 0947   ALT 23 01/19/2020 0947   ALKPHOS 60 01/19/2020  0947   BILITOT 0.5 01/19/2020 0947   GFRNONAA >60 01/19/2020 0947   GFRAA >60 01/19/2020 0947    Diabetic Labs (most recent): Lab Results  Component Value Date   HGBA1C >15.5 (H) 11/21/2019   HGBA1C 4.9 03/29/2019   HGBA1C 5.3 09/06/2017    Lab Results  Component Value Date   TSH 1.122 09/08/2017      Assessment & Plan:   1. Uncontrolled type 2 diabetes mellitus with hyperglycemia (HCC)  - [place] has currently uncontrolled symptomatic type 2 DM since  69 years of age.  He presents with significant improvement in his glycemic profile was fasting and postprandial.  His average blood glucose is 146-156 for the last 30 days.  This is despite his recent A1c of   >15.5% at end of March 2021.  - Recent labs reviewed. - I had a long discussion with him about the progressive nature of diabetes and the pathology behind its complications. -his diabetes is complicated by obesity/sedentary life, peripheral arterial disease with venous stasis and he remains at a high risk for more acute and chronic complications which include CAD, CVA, CKD, retinopathy, and neuropathy. These are all discussed in detail with him.  - I have counseled him on diet  and weight management  by adopting a carbohydrate restricted/protein rich diet. Patient is encouraged to switch to  unprocessed or minimally processed     complex starch and increased protein intake (animal or plant source), fruits, and vegetables. -  he is advised to stick to a routine mealtimes to eat 3 meals  a day and avoid unnecessary snacks ( to snack only to correct hypoglycemia).   - he  admits there is a room for improvement in his diet and drink choices. -  Suggestion is made for him to avoid simple carbohydrates  from his diet including Cakes, Sweet Desserts / Pastries, Ice Cream, Soda (diet and regular), Sweet Tea, Candies, Chips, Cookies, Sweet Pastries,  Store Bought Juices, Alcohol in Excess of  1-2 drinks a day, Artificial  Sweeteners, Coffee Creamer, and "Sugar-free" Products. This will help patient to have stable blood glucose profile and potentially avoid unintended weight gain.   - he will be scheduled with Jearld Fenton, RDN, CDE for diabetes education.  - I have approached him with the following individualized plan to manage  his diabetes and patient agrees:   -Given his presentation with controlled glycemic profile, he will not need bolus insulin at this time.   -He is approached to lower his [place] to 35 units nightly, associated with monitoring of blood glucose twice a day-daily before breakfast and at bedtime.   - he is warned not to take insulin without proper monitoring per orders. - he is encouraged to call clinic for blood glucose levels less than 70 or above 300 mg /dl. - he is advised to discontinue glimepiride at this time.  He will continue  to benefit from low-dose Metformin, 500 mg p.o. twice daily.    - he is not a candidate for TZDs, SGLT2 inhibitors due to compromised peripheral circulation.    - he will be considered for incretin therapy as appropriate next visit.  - Specific targets for  A1c;  LDL, HDL,  and Triglycerides were discussed with the patient.  2) Blood Pressure /Hypertension:  -His blood pressure is controlled to target.  he is advised to continue his current medications including metoprolol 25 mg p.o. twice daily, Lasix as needed.    3) Lipids/Hyperlipidemia: He does not have recent lipid panel to review.  He is advised to continue atorvastatin 40 mg p.o. nightly.   Side effects and precautions discussed with him.  4)  Weight/Diet:  Body mass index is 38.75 kg/m.  -   clearly complicating his diabetes care.   he is  a candidate for weight loss. I discussed with him the fact that loss of 5 - 10% of his  current body weight will have the most impact on his diabetes management.  Exercise, and detailed carbohydrates information provided  -  detailed on discharge  instructions.  5) Cellulitis of BLE: -Cellulitis has cleared with treatment with Bactrim, at risk of recurrent cellulitis as a result of  chronic venous stasis, will need evaluation by vascular surgery. He will also need podiatric care, will be referred to podiatry clinic in [place].   6) Chronic Care/Health Maintenance:  -he  is on Statin medications and  is encouraged to initiate and continue to follow up with Ophthalmology, Dentist,  Podiatrist at least yearly or according to recommendations, and advised to   stay away from smoking. I have recommended yearly flu vaccine and pneumonia vaccine at least every 5 years; moderate intensity exercise for up to 150 minutes weekly; and  sleep for at least 7 hours a day.  - he is  advised to maintain close follow up with Redmond School, MD for primary care needs, as well as his other providers for optimal and coordinated care.   - Time spent on this patient care encounter:  40  min, of which > 50% was spent in  counseling and the rest reviewing his blood glucose logs , discussing his hypoglycemia and hyperglycemia episodes, reviewing his current and  previous labs / studies  ( including abstraction from other facilities) and medications  doses and developing a  long term treatment plan and documenting his care.   Please refer to Patient Instructions for Blood Glucose Monitoring and Insulin/Medications Dosing Guide"  in media tab for additional information. Please  also refer to " Patient Self Inventory" in the Media  tab for reviewed elements of pertinent patient history.  [name] participated in the discussions, expressed understanding, and voiced agreement with the above plans.  All questions were answered to his satisfaction. he is encouraged to contact clinic should he have any questions or concerns prior to his return visit.   Follow up plan: - Return in about 3 months (around 04/26/2020) for Bring Meter and Logs- A1c in Office.  Glade Lloyd, MD [facility] [address] [place],  [address] Phone: [phone]  Fax: [phone]    01/25/2020, 6:09 PM  This note was partially dictated with voice recognition software. Similar sounding words can be transcribed inadequately or may not  be corrected upon review.

## 2020-01-25 NOTE — Progress Notes (Signed)
  Medical Nutrition Therapy:  Appt start time: 0900 end time:  0930.   Assessment:  Primary concerns today:  Diabetes Type 2, Obesity, HTN and Hyperlipidemia.  Drove himself. Sees Dr. Dorris Fetch, Endocrinology.  FBS 90-100's.  Tresiba 35 units a day now instead of 40 units now due to low blood sugars. He is doing better on portions and timing of meals. He has also been reduced on his prednisone. He is now going to testing his blood sugars. Cut down on eating out.  Cut out fried foods. Has increased fresh fruits and vegetables. BS are much better.  Lab Results  Component Value Date   HGBA1C >15.5 (H) 11/21/2019    Wt Readings from Last 3 Encounters:  01/25/20 277 lb 12.8 oz (126 kg)  01/19/20 274 lb 14.4 oz (124.7 kg)  12/28/19 267 lb 14.4 oz (121.5 kg)   Ht Readings from Last 3 Encounters:  01/25/20 5\' 11"  (1.803 m)  12/25/19 5\' 11"  (1.803 m)  12/25/19 5\' 11"  (1.803 m)   There is no height or weight on file to calculate BMI. @BMIFA @ Facility age limit for growth percentiles is 20 years. Facility age limit for growth percentiles is 20 years.    24 hr recall B 1 slice toast and boiled egg L 2 pm chef salad-water D- Meat and vegetables. water   Usual physical activity: ADL  Estimated energy needs: 1800 calories 200 g carbohydrates 135 g protein 50 g fat  Progress Towards Goal(s):  In progress.   Nutritional Diagnosis:  NB-1.1 Food and nutrition-related knowledge deficit As related to Diabetes Type 2.  As evidenced by A1C > 15%.    Intervention:  Nutrition and Diabetes education provided on My Plate, CHO counting, meal planning, portion sizes, timing of meals, avoiding snacks between meals unless having a low blood sugar, target ranges for A1C and blood sugars, signs/symptoms and treatment of hyper/hypoglycemia, monitoring blood sugars, taking medications as prescribed, benefits of exercising 30 minutes per day and prevention of complications of DM.   Marland KitchenGoal  Drink 5  bottles of water per day Egg sandwich with fruit or yogurt for breakfast Test blood sugars twice a day. Stop GLimperide. Metformin 500 mg BID.  Teaching Method Utilized:  Visual Auditory Hands on  Handouts given during visit include:  The Plate method  Diabetes instructions Herbs to season with.  Barriers to learning/adherence to lifestyle change: limited mobility  Demonstrated degree of understanding via:  Teach Back   Monitoring/Evaluation:  Dietary intake, exercise, , and body weight in 3 month.Marland Kitchen

## 2020-01-25 NOTE — Patient Instructions (Addendum)
Goal  Drink 5 bottles of water per day Egg sandwich with fruit or yogurt for breakfast Test blood sugars twice a day. Stop GLimperide. Metformin 500 mg BID.

## 2020-01-25 NOTE — Patient Instructions (Signed)

## 2020-01-29 ENCOUNTER — Telehealth: Payer: Self-pay | Admitting: "Endocrinology

## 2020-01-29 NOTE — Telephone Encounter (Signed)
Pt is calling and states he is needing a prescription for lancets he has ran out and he test 2x a day.  [facility] [phone] - [place], [place] AT [address] [phone] (Phone) [phone] (Fax)

## 2020-02-12 ENCOUNTER — Other Ambulatory Visit: Payer: Self-pay

## 2020-02-12 ENCOUNTER — Inpatient Hospital Stay (HOSPITAL_BASED_OUTPATIENT_CLINIC_OR_DEPARTMENT_OTHER): Payer: Medicare Other | Admitting: Nurse Practitioner

## 2020-02-12 ENCOUNTER — Inpatient Hospital Stay (HOSPITAL_COMMUNITY): Payer: Medicare Other | Attending: Hematology

## 2020-02-12 VITALS — BP 125/67 | HR 77 | Temp 98.5°F | Resp 18 | Wt 281.2 lb

## 2020-02-12 DIAGNOSIS — E1165 Type 2 diabetes mellitus with hyperglycemia: Secondary | ICD-10-CM | POA: Diagnosis not present

## 2020-02-12 DIAGNOSIS — Z8572 Personal history of non-Hodgkin lymphomas: Secondary | ICD-10-CM | POA: Insufficient documentation

## 2020-02-12 DIAGNOSIS — D5919 Other autoimmune hemolytic anemia: Secondary | ICD-10-CM

## 2020-02-12 DIAGNOSIS — C8330 Diffuse large B-cell lymphoma, unspecified site: Secondary | ICD-10-CM | POA: Diagnosis not present

## 2020-02-12 DIAGNOSIS — D591 Autoimmune hemolytic anemia, unspecified: Secondary | ICD-10-CM | POA: Insufficient documentation

## 2020-02-12 DIAGNOSIS — M7989 Other specified soft tissue disorders: Secondary | ICD-10-CM | POA: Insufficient documentation

## 2020-02-12 DIAGNOSIS — I110 Hypertensive heart disease with heart failure: Secondary | ICD-10-CM | POA: Diagnosis not present

## 2020-02-12 LAB — RETICULOCYTES
Immature Retic Fract: 27.4 % — ABNORMAL HIGH (ref 2.3–15.9)
RBC.: 3.91 MIL/uL — ABNORMAL LOW (ref 4.22–5.81)
Retic Count, Absolute: 98.1 10*3/uL (ref 19.0–186.0)
Retic Ct Pct: 2.5 % (ref 0.4–3.1)

## 2020-02-12 LAB — FOLATE: Folate: 39.4 ng/mL (ref >5.9)

## 2020-02-12 LAB — CBC WITH DIFFERENTIAL/PLATELET
Abs Immature Granulocytes: 0.02 10*3/uL (ref 0.00–0.07)
Basophils Absolute: 0 10*3/uL (ref 0.0–0.1)
Basophils Relative: 1 %
Eosinophils Absolute: 0.3 10*3/uL (ref 0.0–0.5)
Eosinophils Relative: 7 %
HCT: 39.5 % (ref 39.0–52.0)
Hemoglobin: 12.6 g/dL — ABNORMAL LOW (ref 13.0–17.0)
Immature Granulocytes: 1 %
Lymphocytes Relative: 11 %
Lymphs Abs: 0.5 10*3/uL — ABNORMAL LOW (ref 0.7–4.0)
MCH: 32.3 pg (ref 26.0–34.0)
MCHC: 31.9 g/dL (ref 30.0–36.0)
MCV: 101.3 fL — ABNORMAL HIGH (ref 80.0–100.0)
Monocytes Absolute: 0.7 10*3/uL (ref 0.1–1.0)
Monocytes Relative: 15 %
Neutro Abs: 2.8 10*3/uL (ref 1.7–7.7)
Neutrophils Relative %: 65 %
Platelets: 149 10*3/uL — ABNORMAL LOW (ref 150–400)
RBC: 3.9 MIL/uL — ABNORMAL LOW (ref 4.22–5.81)
RDW: 14.7 % (ref 11.5–15.5)
WBC: 4.2 10*3/uL (ref 4.0–10.5)
nRBC: 0 % (ref 0.0–0.2)

## 2020-02-12 LAB — COMPREHENSIVE METABOLIC PANEL
ALT: 17 U/L (ref 0–44)
AST: 18 U/L (ref 15–41)
Albumin: 3.6 g/dL (ref 3.5–5.0)
Alkaline Phosphatase: 55 U/L (ref 38–126)
Anion gap: 7 (ref 5–15)
BUN: 12 mg/dL (ref 8–23)
CO2: 27 mmol/L (ref 22–32)
Calcium: 9 mg/dL (ref 8.9–10.3)
Chloride: 107 mmol/L (ref 98–111)
Creatinine, Ser: 0.89 mg/dL (ref 0.61–1.24)
GFR calc Af Amer: >60 mL/min (ref >60)
GFR calc non Af Amer: >60 mL/min (ref >60)
Glucose, Bld: 110 mg/dL — ABNORMAL HIGH (ref 70–99)
Potassium: 3.7 mmol/L (ref 3.5–5.1)
Sodium: 141 mmol/L (ref 135–145)
Total Bilirubin: 0.4 mg/dL (ref 0.3–1.2)
Total Protein: 6.3 g/dL — ABNORMAL LOW (ref 6.5–8.1)

## 2020-02-12 LAB — IRON AND TIBC
Iron: 65 ug/dL (ref 45–182)
Saturation Ratios: 19 % (ref 17.9–39.5)
TIBC: 338 ug/dL (ref 250–450)
UIBC: 273 ug/dL

## 2020-02-12 LAB — VITAMIN B12: Vitamin B-12: 515 pg/mL (ref 180–914)

## 2020-02-12 LAB — VITAMIN D 25 HYDROXY (VIT D DEFICIENCY, FRACTURES): Vit D, 25-Hydroxy: 32.92 ng/mL (ref 30–100)

## 2020-02-12 LAB — LACTATE DEHYDROGENASE: LDH: 161 U/L (ref 98–192)

## 2020-02-12 LAB — FERRITIN: Ferritin: 191 ng/mL (ref 24–336)

## 2020-02-12 MED ORDER — PREDNISONE 1 MG PO TABS: 2.0000 mg | ORAL_TABLET | ORAL | 1 refills | Status: DC

## 2020-02-12 NOTE — Addendum Note (Signed)
Addended by: Glennie Isle on: 02/12/2020 11:32 AM   Modules accepted: Orders

## 2020-02-12 NOTE — Assessment & Plan Note (Addendum)
1.  Autoimmune hemolytic anemia: -Prednisone 120 mg daily started on 08/05/2019, rituximab 4 doses from 08/15/2019 through 09/05/2019. -He was last seen on 12/22/2019 and the plan was to cut prednisone to 5 mg daily for 2 weeks followed by 5 mg every other day for the next 2 weeks. -We gave 2 IV iron infusions on 01/05/2020 and 01/12/2020 due to his ferritin being 92% saturation 18. -He was taking 2.5 mg of prednisone every other day. -Labs done on 02/12/2020 showed LDH 161, hemoglobin 12.6, MCV 101.3, platelets 149, reticulocyte 2.5 -He will start 2 mg of prednisone every other day. -We will see him back in 3 weeks with repeat labs.  2.  Leg swelling: -He will use Lasix 20 mg daily as needed.  3.  Poorly controlled diabetes: -Glucose today is 110.  He is taking Amaryl 2 mg twice daily. -Patient reports it is hard to control his diabetes with the prednisone.  4.  DLBCL: -Diagnosed in 2006, treated with R-CHOP with complete response on PET scan. -Relapse of lymphoma on 08/31/2005, treated with salvage chemotherapy followed by stem cell transplant on 01/15/2006. -Rituximab weekly x4 followed by every 6 months for 2 years completed on 12/05/2007. -PET scan on 08/14/2019 did not show any evidence or recurrence of lymphoma. -No B symptoms at this time.

## 2020-02-12 NOTE — Progress Notes (Signed)
[facility] [place], [place] [address]   CLINIC:  Medical Oncology/Hematology  PCP:  [facility], [place] [address] [phone]   REASON FOR VISIT: Follow-up for autoimmune hemolytic anemia    CURRENT THERAPY: prednisone 77m every other day  BRIEF ONCOLOGIC HISTORY:  Oncology History Overview Note  S/P chemotherapy following stabilization of the spine.  Initial biopsy was on 07/13/2005 on his bone marrow.  Thoracic vertebra was biopsied on 02/04/2005 and mediastinum on 02/03/2005.  S/P R-CHOP and achieved an incomplete response, then underwent ICE chemotherapy pre-transplant and total body radiation with autologous stem cell transplant on 01/15/2006 and maintained on Rituxan for 2 years.  Thus far without recurrence.   DLBCL (diffuse large B cell lymphoma) ([facility]  01/27/2005 Imaging   PET- diffuse activity  involving the L cervical area, extensive activity in the mediastinumn particularly in the R paratracheal area and to a lesser degree in the anterior mediastinum.  Lg area in porta caval nodes in upper abd and diffuse bone activity   02/03/2005 Pathology Results   Mediastinal lymph node demonstrating diffuse large cell lymphoma, B-type.  Vertebral bone biopsy demonstrating diffuse large b-cell lymphoma   03/11/2005 - 08/04/2005 Chemotherapy   R-CHOP x 8 cycles   05/04/2005 Imaging   PET- near resolution of previously identified uptake.   07/13/2005 Bone Marrow Biopsy   Bone marrow aspiration and biopsy is negative for lymphoma   08/31/2005 Imaging   PET- further response to therapy with findings consistent with residual disease.  Hypermetabolic activity identified within the anterior mediastinal lymph node (smaller but still pathologic).  Borderline hypermetabolic small right pleural effusion   10/20/2005 - 11/13/2005 Chemotherapy   [facility]  01/08/2006 - 01/09/2006 Chemotherapy   Cyclophosphamide 600 mg  daily x 2   01/10/2006 - 01/14/2006 Radiation Therapy   Total body radiation twice daily   01/15/2006 Bone Marrow Transplant   [facility]  04/05/2006 - 12/05/2007 Chemotherapy   Rituxan weekly x 4 every 6 months x 2 years     CANCER STAGING: Cancer Staging DLBCL (diffuse large B cell lymphoma) (HCC) Staging form: Lymphoid Neoplasms, AJCC 6th Edition - Clinical: Stage IV - Signed by KBaird Cancer PA on 04/29/2011    INTERVAL HISTORY:  [name]. male returns for routine follow-up for autoimmune hemolytic anemia. He is doing well since we last saw him. His blood sugars are slowly normalizing. He is eating good and has no new problems at this time. Denies any nausea, vomiting, or diarrhea. Denies any new pains. Had not noticed any recent bleeding such as epistaxis, hematuria or hematochezia. Denies recent chest pain on exertion, shortness of breath on minimal exertion, pre-syncopal episodes, or palpitations. Denies any numbness or tingling in hands or feet. Denies any recent fevers, infections, or recent hospitalizations. Patient reports appetite at 50% and energy level at 50%.     REVIEW OF SYSTEMS:  Review of Systems  Respiratory: Positive for shortness of breath.   All other systems reviewed and are negative.    PAST MEDICAL/SURGICAL HISTORY:  Past Medical History:  Diagnosis Date  . Asthma    as child  . Diabetes mellitus without complication ([facility]   . DLBCL (diffuse large B cell lymphoma) ([facility] 02/28/2009  . Edema   . Heart failure, diastolic, chronic ([facility]    patient denies  . Hyperlipidemia, mixed   . Hypertension   .  IDA (iron deficiency anemia)   . Large cell lymphoma ([place]) 01/2005   autologous stem cell transplant 12/2005  . Obesity, morbid (more than 100 lbs over ideal weight or BMI > 40) (HCC)   . Venous insufficiency 04/29/2011   Past Surgical History:  Procedure Laterality Date  . BACK SURGERY  2006   T6 vertebrae removed/titanium  placed  . CATARACT EXTRACTION W/PHACO Right 03/31/2019   Procedure: CATARACT EXTRACTION PHACO AND INTRAOCULAR LENS PLACEMENT (IOC);  Surgeon: Baruch Goldmann, MD;  Location: [facility];  Service: Ophthalmology;  Laterality: Right;  CDE: 3.21  . CATARACT EXTRACTION W/PHACO Left 04/14/2019   Procedure: CATARACT EXTRACTION PHACO AND INTRAOCULAR LENS PLACEMENT (IOC);  Surgeon: Baruch Goldmann, MD;  Location: [facility];  Service: Ophthalmology;  Laterality: Left;  left - pt knows to arrive at 7:45, CDE: 3.55  . COLONOSCOPY  11/24/2004   Polyps in the left colon ablated/removed as described above.  Two submucosal lesions consistent with lipomas as described above not  manipulated./ Normal rectum  . COLONOSCOPY  06/20/2012   MULTIPLE RECTAL AND COLONIC POLYPS  . COLONOSCOPY N/A 03/08/2015   RMR: Capacious, redundant colon. Multiple colonic and rectosigmoid polyps removed. ablated as described above. colonic lipoma abnormal appearing terminal ileum likely a variant of normal). Howeverwith history  biopsies obtained.   . ESOPHAGOGASTRODUODENOSCOPY N/A 03/08/2015   RMR: Hiatal hernia Polypoid gastric mucosa with multiple gastric polyps. largest polyp removed via snare polypectomgy hemostasis clip placed at base. Status post gastric biopsy. Status post video capsule placement.   Marland Kitchen GIVENS CAPSULE STUDY N/A 03/08/2015   Procedure: GIVENS CAPSULE STUDY;  Surgeon: Daneil Dolin, MD;  Location: [facility];  Service: Endoscopy;  Laterality: N/A;  . LIMBAL STEM CELL TRANSPLANT    . LUNG BIOPSY  6/06  . MULTIPLE TOOTH EXTRACTIONS  04/2005  . port a cath placement    . PORT-A-CATH REMOVAL  09/08/2012   Procedure: REMOVAL PORT-A-CATH;  Surgeon: Melrose Nakayama, MD;  Location: [facility];  Service: Thoracic;  Laterality: N/A;     SOCIAL HISTORY:  Social History   Socioeconomic History  . Marital status: Married    Spouse name: Not on file  . Number of children: 2  . Years of education: Not on file  . Highest education  level: Not on file  Occupational History  . Occupation: disability due to back    Employer: UNEMPLOYED  Tobacco Use  . Smoking status: Former Smoker    Packs/day: 0.50    Years: 15.00    Pack years: 7.50    Quit date: 11/11/2002    Years since quitting: 17.2  . Smokeless tobacco: Never Used  Vaping Use  . Vaping Use: Never used  Substance and Sexual Activity  . Alcohol use: No    Alcohol/week: 0.0 standard drinks  . Drug use: No  . Sexual activity: Yes    Birth control/protection: None  Other Topics Concern  . Not on file  Social History Narrative   Married   No regular exercise   Social Determinants of Health   Financial Resource Strain:   . Difficulty of Paying Living Expenses:   Food Insecurity:   . Worried About Charity fundraiser in the Last Year:   . Arboriculturist in the Last Year:   Transportation Needs:   . Film/video editor (Medical):   Marland Kitchen Lack of Transportation (Non-Medical):   Physical Activity:   . Days of Exercise per Week:   . Minutes  of Exercise per Session:   Stress:   . Feeling of Stress :   Social Connections:   . Frequency of Communication with Friends and Family:   . Frequency of Social Gatherings with Friends and Family:   . Attends Religious Services:   . Active Member of Clubs or Organizations:   . Attends Archivist Meetings:   Marland Kitchen Marital Status:   Intimate Partner Violence:   . Fear of Current or Ex-Partner:   . Emotionally Abused:   Marland Kitchen Physically Abused:   . Sexually Abused:     FAMILY HISTORY:  Family History  Problem Relation Age of Onset  . Cancer Mother        lung  . Hypertension Mother   . Hyperlipidemia Mother   . Cancer Father        prostate  . Hypertension Father   . Colon cancer Neg Hx   . Diabetes Neg Hx     CURRENT MEDICATIONS:  Outpatient Encounter Medications as of 02/12/2020  Medication Sig  . apixaban (ELIQUIS) 5 MG TABS tablet Take 1 tablet (5 mg total) by mouth 2 (two) times daily.  .  Blood Glucose Monitoring Suppl (ONE TOUCH ULTRA 2) w/Device KIT USE TO TEST BLOOD SUGAR ONCE D UTD  . ergocalciferol (VITAMIN D2) 1.25 MG (50000 UT) capsule Take 1 capsule (50,000 Units total) by mouth once a week.  . folic acid (FOLVITE) 1 MG tablet TAKE 1 TABLET(1 MG) BY MOUTH DAILY  . insulin degludec (TRESIBA FLEXTOUCH) 100 UNIT/ML FlexTouch Pen Inject 0.35 mLs (35 Units total) into the skin at bedtime.  . Insulin Pen Needle (B-D ULTRAFINE III SHORT PEN) 31G X 8 MM MISC 1 each by Does not apply route daily. Use once a day as directed  . Lancets (ONETOUCH DELICA PLUS [id number]) MISC USE TO TEST BLOOD SUGAR ONCE D  . metFORMIN (GLUCOPHAGE) 500 MG tablet Take 1 tablet (500 mg total) by mouth 2 (two) times daily with a meal.  . ONETOUCH ULTRA test strip USE TO TEST BLOOD SUGAR  4 TIMES A DAY (Patient taking differently: USE TO TEST BLOOD SUGAR 2 TIMES A DAY)  . predniSONE (DELTASONE) 5 MG tablet Take one tablet PO every other day  . rosuvastatin (CRESTOR) 40 MG tablet Take 40 mg by mouth daily.  . clotrimazole-betamethasone (LOTRISONE) cream Apply 1 application topically 2 (two) times daily as needed (leg infection).  (Patient not taking: Reported on 02/12/2020)  . furosemide (LASIX) 20 MG tablet Take 20 mg by mouth daily as needed for fluid or edema.  (Patient not taking: Reported on 02/12/2020)  . metoprolol tartrate (LOPRESSOR) 25 MG tablet Take 1 tablet (25 mg total) by mouth 2 (two) times daily.  . predniSONE (DELTASONE) 1 MG tablet Take 2 tablets (2 mg total) by mouth every other day.   No facility-administered encounter medications on file as of 02/12/2020.    ALLERGIES:  Allergies  Allergen Reactions  . Penicillins Rash    Has patient had a PCN reaction causing immediate rash, facial/tongue/throat swelling, SOB or lightheadedness with hypotension: Unknown Has patient had a PCN reaction causing severe rash involving mucus membranes or skin necrosis: Unknown Has patient had a PCN reaction  that required hospitalization: Unknown Has patient had a PCN reaction occurring within the last 10 years: No If all of the above answers are "NO", then may proceed with Cephalosporin use.      PHYSICAL EXAM:  ECOG Performance status: 1  Vitals:   02/12/20 1011  BP: 125/67  Pulse: 77  Resp: 18  Temp: 98.5 F (36.9 C)  SpO2: 98%   Filed Weights   02/12/20 1011  Weight: 281 lb 3.2 oz (127.6 kg)   Physical Exam Constitutional:      Appearance: Normal appearance. He is normal weight.  Cardiovascular:     Rate and Rhythm: Normal rate and regular rhythm.     Heart sounds: Normal heart sounds.  Pulmonary:     Effort: Pulmonary effort is normal.     Breath sounds: Normal breath sounds.  Abdominal:     General: Bowel sounds are normal.     Palpations: Abdomen is soft.  Musculoskeletal:        General: Normal range of motion.  Skin:    General: Skin is warm.  Neurological:     Mental Status: He is alert and oriented to person, place, and time. Mental status is at baseline.  Psychiatric:        Mood and Affect: Mood normal.        Behavior: Behavior normal.        Thought Content: Thought content normal.        Judgment: Judgment normal.      LABORATORY DATA:  I have reviewed the labs as listed.  CBC    Component Value Date/Time   WBC 4.2 02/12/2020 0947   RBC 3.90 (L) 02/12/2020 0947   RBC 3.91 (L) 02/12/2020 0947   HGB 12.6 (L) 02/12/2020 0947   HCT 39.5 02/12/2020 0947   PLT 149 (L) 02/12/2020 0947   MCV 101.3 (H) 02/12/2020 0947   MCH 32.3 02/12/2020 0947   MCHC 31.9 02/12/2020 0947   RDW 14.7 02/12/2020 0947   LYMPHSABS 0.5 (L) 02/12/2020 0947   MONOABS 0.7 02/12/2020 0947   EOSABS 0.3 02/12/2020 0947   BASOSABS 0.0 02/12/2020 0947   CMP Latest Ref Rng & Units 02/12/2020 01/19/2020 12/27/2019  Glucose 70 - 99 mg/dL 110(H) 104(H) 187(H)  BUN 8 - 23 mg/dL 12 10 17   Creatinine 0.61 - 1.24 mg/dL 0.89 0.86 0.97  Sodium 135 - 145 mmol/L 141 141 136  Potassium  3.5 - 5.1 mmol/L 3.7 3.6 4.3  Chloride 98 - 111 mmol/L 107 105 107  CO2 22 - 32 mmol/L 27 25 24   Calcium 8.9 - 10.3 mg/dL 9.0 9.0 8.3(L)  Total Protein 6.5 - 8.1 g/dL 6.3(L) 6.4(L) 6.3(L)  Total Bilirubin 0.3 - 1.2 mg/dL 0.4 0.5 0.6  Alkaline Phos 38 - 126 U/L 55 60 61  AST 15 - 41 U/L 18 19 19   ALT 0 - 44 U/L 17 23 22    All questions were answered to patient's stated satisfaction. Encouraged patient to call with any new concerns or questions before his next visit to the cancer center and we can certain see him sooner, if needed.     ASSESSMENT & PLAN:  DLBCL (diffuse large B cell lymphoma) ([place]) 1.  Autoimmune hemolytic anemia: -Prednisone 120 mg daily started on 08/05/2019, rituximab 4 doses from 08/15/2019 through 09/05/2019. -He was last seen on 12/22/2019 and the plan was to cut prednisone to 5 mg daily for 2 weeks followed by 5 mg every other day for the next 2 weeks. -We gave 2 IV iron infusions on 01/05/2020 and 01/12/2020 due to his ferritin being 92% saturation 18. -He was taking 2.5 mg of prednisone every other day. -Labs done on 02/12/2020 showed LDH 161, hemoglobin 12.6, MCV 101.3, platelets 149, reticulocyte 2.5 -He will start 2 mg  of prednisone every other day. -We will see him back in 3 weeks with repeat labs.  2.  Leg swelling: -He will use Lasix 20 mg daily as needed.  3.  Poorly controlled diabetes: -Glucose today is 110.  He is taking Amaryl 2 mg twice daily. -Patient reports it is hard to control his diabetes with the prednisone.  4.  DLBCL: -Diagnosed in 2006, treated with R-CHOP with complete response on PET scan. -Relapse of lymphoma on 08/31/2005, treated with salvage chemotherapy followed by stem cell transplant on 01/15/2006. -Rituximab weekly x4 followed by every 6 months for 2 years completed on 12/05/2007. -PET scan on 08/14/2019 did not show any evidence or recurrence of lymphoma. -No B symptoms at this time.     Orders placed this encounter:  Orders  Placed This Encounter  Procedures  . Lactate dehydrogenase  . CBC with Differential/Platelet  . Comprehensive metabolic panel     Francene Finders, FNP-C [place] [phone]

## 2020-02-13 LAB — HEMOGLOBIN A1C
Hgb A1c MFr Bld: 8.2 % — ABNORMAL HIGH (ref 4.8–5.6)
Mean Plasma Glucose: 189 mg/dL

## 2020-02-16 ENCOUNTER — Other Ambulatory Visit: Payer: Self-pay

## 2020-02-16 ENCOUNTER — Encounter: Payer: Self-pay | Admitting: Podiatry

## 2020-02-16 ENCOUNTER — Ambulatory Visit: Payer: Medicare Other | Admitting: Podiatry

## 2020-02-16 VITALS — Temp 95.6°F

## 2020-02-16 DIAGNOSIS — M2141 Flat foot [pes planus] (acquired), right foot: Secondary | ICD-10-CM

## 2020-02-16 DIAGNOSIS — M79674 Pain in right toe(s): Secondary | ICD-10-CM | POA: Diagnosis not present

## 2020-02-16 DIAGNOSIS — B351 Tinea unguium: Secondary | ICD-10-CM

## 2020-02-16 DIAGNOSIS — T148XXA Other injury of unspecified body region, initial encounter: Secondary | ICD-10-CM | POA: Diagnosis not present

## 2020-02-16 DIAGNOSIS — M2142 Flat foot [pes planus] (acquired), left foot: Secondary | ICD-10-CM

## 2020-02-16 DIAGNOSIS — E1165 Type 2 diabetes mellitus with hyperglycemia: Secondary | ICD-10-CM

## 2020-02-16 DIAGNOSIS — M79675 Pain in left toe(s): Secondary | ICD-10-CM | POA: Diagnosis not present

## 2020-02-16 DIAGNOSIS — I890 Lymphedema, not elsewhere classified: Secondary | ICD-10-CM

## 2020-02-16 LAB — METHYLMALONIC ACID, SERUM: Methylmalonic Acid, Quantitative: 226 nmol/L (ref 0–378)

## 2020-02-16 MED ORDER — MUPIROCIN 2 % EX OINT: TOPICAL_OINTMENT | CUTANEOUS | 1 refills | Status: AC

## 2020-02-16 NOTE — Patient Instructions (Addendum)
Apply Mupirocin Ointment to left great toe once daily.  Please see Cardiologist or PCP for management of lymphedema.  We will start process for diabetic shoes for you.   Diabetes Mellitus and Foot Care Foot care is an important part of your health, especially when you have diabetes. Diabetes may cause you to have problems because of poor blood flow (circulation) to your feet and legs, which can cause your skin to:  Become thinner and drier.  Break more easily.  Heal more slowly.  Peel and crack. You may also have nerve damage (neuropathy) in your legs and feet, causing decreased feeling in them. This means that you may not notice minor injuries to your feet that could lead to more serious problems. Noticing and addressing any potential problems early is the best way to prevent future foot problems. How to care for your feet Foot hygiene  Wash your feet daily with warm water and mild soap. Do not use hot water. Then, pat your feet and the areas between your toes until they are completely dry. Do not soak your feet as this can dry your skin. Trim your toenails straight across. Do not dig under them or around the cuticle. File the edges of your nails with an emery board or nail file.  Apply a moisturizing lotion or petroleum jelly to the skin on your feet and to dry, brittle toenails. Use lotion that does not contain alcohol and is unscented. Do not apply lotion between your toes. Shoes and socks  Wear clean socks or stockings every day. Make sure they are not too tight. Do not wear knee-high stockings since they may decrease blood flow to your legs.  Wear shoes that fit properly and have enough cushioning. Always look in your shoes before you put them on to be sure there are no objects inside.  To break in new shoes, wear them for just a few hours a day. This prevents injuries on your feet. Wounds, scrapes, corns, and calluses  Check your feet daily for blisters, cuts, bruises, sores,  and redness. If you cannot see the bottom of your feet, use a mirror or ask someone for help.  Do not cut corns or calluses or try to remove them with medicine.  If you find a minor scrape, cut, or break in the skin on your feet, keep it and the skin around it clean and dry. You may clean these areas with mild soap and water. Do not clean the area with peroxide, alcohol, or iodine.  If you have a wound, scrape, corn, or callus on your foot, look at it several times a day to make sure it is healing and not infected. Check for: ? Redness, swelling, or pain. ? Fluid or blood. ? Warmth. ? Pus or a bad smell. General instructions  Do not cross your legs. This may decrease blood flow to your feet.  Do not use heating pads or hot water bottles on your feet. They may burn your skin. If you have lost feeling in your feet or legs, you may not know this is happening until it is too late.  Protect your feet from hot and cold by wearing shoes, such as at the beach or on hot pavement.  Schedule a complete foot exam at least once a year (annually) or more often if you have foot problems. If you have foot problems, report any cuts, sores, or bruises to your health care provider immediately. Contact a health care provider if:  You have a medical condition that increases your risk of infection and you have any cuts, sores, or bruises on your feet.  You have an injury that is not healing.  You have redness on your legs or feet.  You feel burning or tingling in your legs or feet.  You have pain or cramps in your legs and feet.  Your legs or feet are numb.  Your feet always feel cold.  You have pain around a toenail. Get help right away if:  You have a wound, scrape, corn, or callus on your foot and: ? You have pain, swelling, or redness that gets worse. ? You have fluid or blood coming from the wound, scrape, corn, or callus. ? Your wound, scrape, corn, or callus feels warm to the touch. ? You  have pus or a bad smell coming from the wound, scrape, corn, or callus. ? You have a fever. ? You have a red line going up your leg. Summary  Check your feet every day for cuts, sores, red spots, swelling, and blisters.  Moisturize feet and legs daily.  Wear shoes that fit properly and have enough cushioning.  If you have foot problems, report any cuts, sores, or bruises to your health care provider immediately.  Schedule a complete foot exam at least once a year (annually) or more often if you have foot problems. This information is not intended to replace advice given to you by your health care provider. Make sure you discuss any questions you have with your health care provider. Document Revised: 05/03/2019 Document Reviewed: 09/11/2016 Elsevier Patient Education  [place].     Edema  Edema is an abnormal buildup of fluids in the body tissues and under the skin. Swelling of the legs, feet, and ankles is a common symptom that becomes more likely as you get older. Swelling is also common in looser tissues, like around the eyes. When the affected area is squeezed, the fluid may move out of that spot and leave a dent for a few moments. This dent is called pitting edema. There are many possible causes of edema. Eating too much salt (sodium) and being on your feet or sitting for a long time can cause edema in your legs, feet, and ankles. Hot weather may make edema worse. Common causes of edema include:  Heart failure.  Liver or kidney disease.  Weak leg blood vessels.  Cancer.  An injury.  Pregnancy.  Medicines.  Being obese.  Low protein levels in the blood. Edema is usually painless. Your skin may look swollen or shiny. Follow these instructions at home:  Keep the affected body part raised (elevated) above the level of your heart when you are sitting or lying down.  Do not sit still or stand for long periods of time.  Do not wear tight clothing. Do not wear  garters on your upper legs.  Exercise your legs to get your circulation going. This helps to move the fluid back into your blood vessels, and it may help the swelling go down.  Wear elastic bandages or support stockings to reduce swelling as told by your health care provider.  Eat a low-salt (low-sodium) diet to reduce fluid as told by your health care provider.  Depending on the cause of your swelling, you may need to limit how much fluid you drink (fluid restriction).  Take over-the-counter and prescription medicines only as told by your health care provider. Contact a health care provider if:  Your  edema does not get better with treatment.  You have heart, liver, or kidney disease and have symptoms of edema.  You have sudden and unexplained weight gain. Get help right away if:  You develop shortness of breath or chest pain.  You cannot breathe when you lie down.  You develop pain, redness, or warmth in the swollen areas.  You have heart, liver, or kidney disease and suddenly get edema.  You have a fever and your symptoms suddenly get worse. Summary  Edema is an abnormal buildup of fluids in the body tissues and under the skin.  Eating too much salt (sodium) and being on your feet or sitting for a long time can cause edema in your legs, feet, and ankles.  Keep the affected body part raised (elevated) above the level of your heart when you are sitting or lying down. This information is not intended to replace advice given to you by your health care provider. Make sure you discuss any questions you have with your health care provider. Document Revised: 12/28/2018 Document Reviewed: 09/12/2016 Elsevier Patient Education  [place]  A bunion is a bump on the base of the big toe that forms when the bones of the big toe joint move out of position. Bunions may be small at first, but they often get larger over time. They can make walking painful. What are the  causes? A bunion may be caused by: Wearing narrow or pointed shoes that force the big toe to press against the other toes. Abnormal foot development that causes the foot to roll inward (pronate). Changes in the foot that are caused by certain diseases, such as rheumatoid arthritis or polio. A foot injury. What increases the risk? The following factors may make you more likely to develop this condition: Wearing shoes that squeeze the toes together. Having certain diseases, such as: Rheumatoid arthritis. Polio. Cerebral palsy. Having family members who have bunions. Being born with a foot deformity, such as flat feet or low arches. Doing activities that put a lot of pressure on the feet, such as ballet dancing. What are the signs or symptoms? The main symptom of a bunion is a noticeable bump on the big toe. Other symptoms may include: Pain. Swelling around the big toe. Redness and inflammation. Thick or hardened skin on the big toe or between the toes. Stiffness or loss of motion in the big toe. Trouble with walking. How is this diagnosed? A bunion may be diagnosed based on your symptoms, medical history, and activities. You may have tests, such as: X-rays. These allow your health care provider to check the position of the bones in your foot and look for damage to your joint. They also help your health care provider determine the severity of your bunion and the best way to treat it. Joint aspiration. In this test, a sample of fluid is removed from the toe joint. This test may be done if you are in a lot of pain. It helps rule out diseases that cause painful swelling of the joints, such as arthritis. How is this treated? Treatment depends on the severity of your symptoms. The goal of treatment is to relieve symptoms and prevent the bunion from getting worse. Your health care provider may recommend: Wearing shoes that have a wide toe box. Using bunion pads to cushion the affected  area. Taping your toes together to keep them in a normal position. Placing a device inside your shoe (orthotics) to help reduce  pressure on your toe joint. Taking medicine to ease pain, inflammation, and swelling. Applying heat or ice to the affected area. Doing stretching exercises. Surgery to remove scar tissue and move the toes back into their normal position. This treatment is rare. Follow these instructions at home: Managing pain, stiffness, and swelling  If directed, put ice on the painful area: Put ice in a plastic bag. Place a towel between your skin and the bag. Leave the ice on for 20 minutes, 2-3 times a day. Activity  If directed, apply heat to the affected area before you exercise. Use the heat source that your health care provider recommends, such as a moist heat pack or a heating pad. Place a towel between your skin and the heat source. Leave the heat on for 20-30 minutes. Remove the heat if your skin turns bright red. This is especially important if you are unable to feel pain, heat, or cold. You may have a greater risk of getting burned. Do exercises as told by your health care provider. General instructions Support your toe joint with proper footwear, shoe padding, or taping as told by your health care provider. Take over-the-counter and prescription medicines only as told by your health care provider. Keep all follow-up visits as told by your health care provider. This is important. Contact a health care provider if your symptoms: Get worse. Do not improve in 2 weeks. Get help right away if you have: Severe pain and trouble with walking. Summary A bunion is a bump on the base of the big toe that forms when the bones of the big toe joint move out of position. Bunions can make walking painful. Treatment depends on the severity of your symptoms. Support your toe joint with proper footwear, shoe padding, or taping as told by your health care provider. This  information is not intended to replace advice given to you by your health care provider. Make sure you discuss any questions you have with your health care provider. Document Revised: 02/14/2018 Document Reviewed: 12/21/2017 Elsevier Patient Education  [place]  Athlete's foot (tinea pedis) is a fungal infection of the skin on your feet. It often occurs on the skin that is between or underneath your toes. It can also occur on the soles of your feet. Symptoms include itchy or white and flaky areas on the skin. The infection can spread from person to person (is contagious). It can also spread when a person's bare feet come in contact with the fungus on shower floors or on items such as shoes. Follow these instructions at home: Medicines  Apply or take over-the-counter and prescription medicines only as told by your doctor.  Apply your antifungal medicine as told by your doctor. Do not stop using the medicine even if your feet start to get better. Foot care  Do not scratch your feet.  Keep your feet dry: ? Wear cotton or wool socks. Change your socks every day or if they become wet. ? Wear shoes that allow air to move around, such as sandals or canvas tennis shoes.  Wash and dry your feet: ? Every day or as told by your doctor. ? After exercising. ? Including the area between your toes. General instructions  Do not share any of these items that touch your feet: ? Towels. ? Shoes. ? Nail clippers. ? Other personal items.  Protect your feet by wearing sandals in wet areas, such as locker rooms and shared showers.  Keep  all follow-up visits as told by your doctor. This is important.  If you have diabetes, keep your blood sugar under control. Contact a doctor if:  You have a fever.  You have swelling, pain, warmth, or redness in your foot.  Your feet are not getting better with treatment.  Your symptoms get worse.  You have new  symptoms. Summary  Athlete's foot is a fungal infection of the skin on your feet.  Symptoms include itchy or white and flaky areas on the skin.  Apply your antifungal medicine as told by your doctor.  Keep your feet clean and dry. This information is not intended to replace advice given to you by your health care provider. Make sure you discuss any questions you have with your health care provider. Document Revised: 08/05/2017 Document Reviewed: 05/31/2017 Elsevier Patient Education  [place].

## 2020-02-21 DIAGNOSIS — E1129 Type 2 diabetes mellitus with other diabetic kidney complication: Secondary | ICD-10-CM | POA: Diagnosis not present

## 2020-02-21 DIAGNOSIS — Z87891 Personal history of nicotine dependence: Secondary | ICD-10-CM | POA: Diagnosis not present

## 2020-02-21 DIAGNOSIS — I2510 Atherosclerotic heart disease of native coronary artery without angina pectoris: Secondary | ICD-10-CM | POA: Diagnosis not present

## 2020-02-21 DIAGNOSIS — I4891 Unspecified atrial fibrillation: Secondary | ICD-10-CM | POA: Diagnosis not present

## 2020-02-21 NOTE — Progress Notes (Signed)
Subjective: [name] presents today referred by Redmond School, MD for diabetic foot evaluation.  Patient relates 16 year history of diabetes.  Patient denies any history of foot wounds.  Patient relates symptoms of tingling in feet. States legs feel heavy due to edema.   Today, patient c/o of painful, discolored, thick toenails which interfere with daily activities.  Pain is aggravated when wearing enclosed shoe gear.   Past Medical History:  Diagnosis Date  . Asthma    as child  . Diabetes mellitus without complication ([place])   . DLBCL (diffuse large B cell lymphoma) ([place]) 02/28/2009  . Edema   . Heart failure, diastolic, chronic ([place])    patient denies  . Hyperlipidemia, mixed   . Hypertension   . IDA (iron deficiency anemia)   . Large cell lymphoma ([place]) 01/2005   autologous stem cell transplant 12/2005  . Obesity, morbid (more than 100 lbs over ideal weight or BMI > 40) ([facility])   . Venous insufficiency 04/29/2011    Patient Active Problem List   Diagnosis Date Noted  . Vitamin D deficiency 01/25/2020  . Uncontrolled type 2 diabetes mellitus with hyperglycemia ([place]) 12/14/2019  . Cellulitis of lower extremity 12/14/2019  . Autoimmune hemolytic anemia due to IgG   . Symptomatic anemia   . Anemia 08/02/2019  . Macrocytic anemia 08/02/2019  . Atrial flutter ([place]) 09/07/2017  . Bilateral pneumonia 09/07/2017  . Acute respiratory failure with hypoxia ([place]) 05/19/2017  . AKI (acute kidney injury) ([place])   . Sepsis due to pneumonia ([place]) 05/18/2017  . Type 2 diabetes mellitus with complication, without long-term current use of insulin ([place]) 02/19/2017  . Essential hypertension, benign 04/03/2015  . IDA (iron deficiency anemia)   . Mucosal abnormality of stomach   . Hx of adenomatous colonic polyps 06/13/2012  . Status post autologous bone marrow transplantation ([place]) 07/15/2011  . Venous insufficiency 04/29/2011  . DLBCL (diffuse large B cell lymphoma) ([facility]) 02/28/2009  .  Mixed hyperlipidemia 02/28/2009  . Class 2 severe obesity due to excess calories with serious comorbidity and body mass index (BMI) of 36.0 to 36.9 in adult ([place]) 02/28/2009  . DIASTOLIC HEART FAILURE, CHRONIC 02/28/2009  . EDEMA 02/28/2009    Past Surgical History:  Procedure Laterality Date  . BACK SURGERY  2006   T6 vertebrae removed/titanium placed  . CATARACT EXTRACTION W/PHACO Right 03/31/2019   Procedure: CATARACT EXTRACTION PHACO AND INTRAOCULAR LENS PLACEMENT (IOC);  Surgeon: Baruch Goldmann, MD;  Location: [facility];  Service: Ophthalmology;  Laterality: Right;  CDE: 3.21  . CATARACT EXTRACTION W/PHACO Left 04/14/2019   Procedure: CATARACT EXTRACTION PHACO AND INTRAOCULAR LENS PLACEMENT (IOC);  Surgeon: Baruch Goldmann, MD;  Location: [facility];  Service: Ophthalmology;  Laterality: Left;  left - pt knows to arrive at 7:45, CDE: 3.55  . COLONOSCOPY  11/24/2004   Polyps in the left colon ablated/removed as described above.  Two submucosal lesions consistent with lipomas as described above not  manipulated./ Normal rectum  . COLONOSCOPY  06/20/2012   MULTIPLE RECTAL AND COLONIC POLYPS  . COLONOSCOPY N/A 03/08/2015   RMR: Capacious, redundant colon. Multiple colonic and rectosigmoid polyps removed. ablated as described above. colonic lipoma abnormal appearing terminal ileum likely a variant of normal). Howeverwith history  biopsies obtained.   . ESOPHAGOGASTRODUODENOSCOPY N/A 03/08/2015   RMR: Hiatal hernia Polypoid gastric mucosa with multiple gastric polyps. largest polyp removed via snare polypectomgy hemostasis clip placed at base. Status post gastric biopsy. Status post video capsule placement.   Marland Kitchen  GIVENS CAPSULE STUDY N/A 03/08/2015   Procedure: GIVENS CAPSULE STUDY;  Surgeon: Daneil Dolin, MD;  Location: [facility];  Service: Endoscopy;  Laterality: N/A;  . LIMBAL STEM CELL TRANSPLANT    . LUNG BIOPSY  6/06  . MULTIPLE TOOTH EXTRACTIONS  04/2005  . port a cath placement    .  PORT-A-CATH REMOVAL  09/08/2012   Procedure: REMOVAL PORT-A-CATH;  Surgeon: Melrose Nakayama, MD;  [facility];  Service: Thoracic;  Laterality: N/A;    Current Outpatient Medications on File Prior to Visit  Medication Sig Dispense Refill  . apixaban (ELIQUIS) 5 MG TABS tablet Take 1 tablet (5 mg total) by mouth 2 (two) times daily. 60 tablet 11  . Blood Glucose Monitoring Suppl (ONE TOUCH ULTRA 2) w/Device KIT USE TO TEST BLOOD SUGAR ONCE D UTD    . ergocalciferol (VITAMIN D2) 1.25 MG (50000 UT) capsule Take 1 capsule (50,000 Units total) by mouth once a week. 16 capsule 3  . folic acid (FOLVITE) 1 MG tablet TAKE 1 TABLET(1 MG) BY MOUTH DAILY 30 tablet 4  . insulin degludec (TRESIBA FLEXTOUCH) 100 UNIT/ML FlexTouch Pen Inject 0.35 mLs (35 Units total) into the skin at bedtime. 5 pen 2  . Insulin Pen Needle (B-D ULTRAFINE III SHORT PEN) 31G X 8 MM MISC 1 each by Does not apply route daily. Use once a day as directed 100 each 3  . Lancets (ONETOUCH DELICA PLUS [id number]) MISC USE TO TEST BLOOD SUGAR ONCE D    . metFORMIN (GLUCOPHAGE) 500 MG tablet Take 1 tablet (500 mg total) by mouth 2 (two) times daily with a meal. 60 tablet 2  . ONETOUCH ULTRA test strip USE TO TEST BLOOD SUGAR  4 TIMES A DAY (Patient taking differently: USE TO TEST BLOOD SUGAR 2 TIMES A DAY) 100 each 2  . predniSONE (DELTASONE) 1 MG tablet Take 2 tablets (2 mg total) by mouth every other day. 60 tablet 1  . predniSONE (DELTASONE) 5 MG tablet Take one tablet PO every other day 30 tablet 2  . rosuvastatin (CRESTOR) 40 MG tablet Take 40 mg by mouth daily.    . clotrimazole-betamethasone (LOTRISONE) cream Apply 1 application topically 2 (two) times daily as needed (leg infection).  (Patient not taking: Reported on 02/12/2020)    . furosemide (LASIX) 20 MG tablet Take 20 mg by mouth daily as needed for fluid or edema.  (Patient not taking: Reported on 02/12/2020)  0  . metoprolol tartrate (LOPRESSOR) 25 MG tablet Take 1 tablet  (25 mg total) by mouth 2 (two) times daily. 180 tablet 3   No current facility-administered medications on file prior to visit.     Allergies  Allergen Reactions  . Penicillins Rash    Has patient had a PCN reaction causing immediate rash, facial/tongue/throat swelling, SOB or lightheadedness with hypotension: Unknown Has patient had a PCN reaction causing severe rash involving mucus membranes or skin necrosis: Unknown Has patient had a PCN reaction that required hospitalization: Unknown Has patient had a PCN reaction occurring within the last 10 years: No If all of the above answers are "NO", then may proceed with Cephalosporin use.     Social History   Occupational History  . Occupation: disability due to back    Employer: UNEMPLOYED  Tobacco Use  . Smoking status: Former Smoker    Packs/day: 0.50    Years: 15.00    Pack years: 7.50    Quit date: 11/11/2002  Years since quitting: 17.2  . Smokeless tobacco: Never Used  Vaping Use  . Vaping Use: Never used  Substance and Sexual Activity  . Alcohol use: No    Alcohol/week: 0.0 standard drinks  . Drug use: No  . Sexual activity: Yes    Birth control/protection: None    Family History  Problem Relation Age of Onset  . Cancer Mother        lung  . Hypertension Mother   . Hyperlipidemia Mother   . Cancer Father        prostate  . Hypertension Father   . Colon cancer Neg Hx   . Diabetes Neg Hx     Immunization History  Administered Date(s) Administered  . Fluad Quad(high Dose 65+) 08/04/2019  . Influenza Split 06/08/2012  . Influenza,inj,Quad PF,6+ Mos 07/14/2013, 06/20/2015  . Moderna SARS-COVID-2 Vaccination 11/03/2019, 12/05/2019    Review of systems: Positive Findings in bold print.  Constitutional:  chills, fatigue, fever, sweats, weight change Communication: Optometrist, sign Ecologist, hand writing, iPad/Android device Head: headaches, head injury Eyes: changes in vision, eye pain, glaucoma,  cataracts, macular degeneration, diplopia, glare,  light sensitivity, eyeglasses or contacts, blindness Ears nose mouth throat: hearing impaired, hearing aids,  ringing in ears, deaf, sign language,  vertigo, nosebleeds,  rhinitis,  cold sores, snoring, swollen glands Cardiovascular: HTN, edema, arrhythmia, pacemaker in place, defibrillator in place, chest pain/tightness, chronic anticoagulation, blood clot, heart failure, MI Peripheral Vascular: leg cramps, varicose veins, blood clots, lymphedema, varicosities Respiratory:  difficulty breathing, denies congestion, SOB, wheezing, cough, emphysema Gastrointestinal: change in appetite or weight, abdominal pain, constipation, diarrhea, nausea, vomiting, vomiting blood, change in bowel habits, abdominal pain, jaundice, rectal bleeding, hemorrhoids, GERD Genitourinary:  nocturia,  pain on urination, polyuria,  blood in urine, Foley catheter, urinary urgency, ESRD on hemodialysis Musculoskeletal: amputation, cramping, stiff joints, painful joints, decreased joint motion, fractures, OA, gout, hemiplegia, paraplegia, uses cane, wheelchair bound, uses walker, uses rollator Skin: +changes in toenails, color change, dryness, itching, mole changes,  rash, wound(s) Neurological: headaches, numbness in feet, paresthesias in feet, burning in feet, fainting,  seizures, change in speech,  headaches, memory problems/poor historian, cerebral palsy, weakness, paralysis, CVA, TIA Endocrine: diabetes, hypothyroidism, hyperthyroidism,  goiter, dry mouth, flushing, heat intolerance,  cold intolerance,  excessive thirst, denies polyuria,  nocturia Hematological:  easy bleeding, excessive bleeding, easy bruising, enlarged lymph nodes, on long term blood thinner, history of past transusions Allergy/immunological:  hives, eczema, frequent infections, multiple drug allergies, seasonal allergies, transplant recipient, multiple food allergies Psychiatric:  anxiety, depression, mood  disorder, suicidal ideations, hallucinations, insomnia  Objective: Vitals:   02/16/20 1103  Temp: (!) 95.6 F (35.3 C)    Nason H Range is a/an 69 y.o. African American male morbidly obese in NAD.Marland Kitchen AAO X 3.  Vascular Examination: Capillary refill time to digits immediate b/l. Palpable pedal pulses b/l LE. Pedal hair present. Pedal hair absent. Lower extremity skin temperature gradient within normal limits. No pain with calf compression b/l. Lymphedema present b/l lower extremities.  Dermatological Examination: Pedal skin with normal turgor, texture and tone bilaterally. No open wounds bilaterally. No interdigital macerations bilaterally. Toenails 1-5 b/l elongated, discolored, dystrophic, thickened, crumbly with subungual debris and tenderness to dorsal palpation.   Abrasion, healing, noted distal tip of left hallux. No erythema, no edema, no drainage, no flocculence.  Musculoskeletal Examination: Normal muscle strength 5/5 to all lower extremity muscle groups bilaterally. No pain crepitus or joint limitation noted with ROM b/l. Pes planus deformity noted  b/l.   Footwear Assessment: Does the patient wear appropriate shoes? Yes. Does the patient need inserts/orthotics? No.  Neurological Examination: Protective sensation intact 5/5 intact bilaterally with 10g monofilament b/l. Vibratory sensation intact b/l. Proprioception intact bilaterally.  Hemoglobin A1C Latest Ref Rng & Units 02/12/2020 11/21/2019 03/29/2019  HGBA1C 4.8 - 5.6 % 8.2(H) >15.5(H) 4.9  Some recent data might be hidden   Assessment: 1. Abrasion   2. Pain due to onychomycosis of toenails of both feet   3. Pes planus of both feet   4. Lymphedema of both lower extremities   5. Uncontrolled type 2 diabetes mellitus with hyperglycemia (HCC)      ADA Risk Categorization:  Low Risk:  Patient has all of the following: Intact protective sensation No prior foot ulcer  No severe deformity Pedal pulses  present  Plan: -Examined patient. -Diabetic foot examination performed on today's visit. -Discussed diabetic foot care principles. Literature dispensed on today. -Toenails 1-5 b/l were debrided in length and girth with sterile nail nippers and dremel without iatrogenic bleeding.  -Patient to report any pedal injuries to medical professional immediately. -Prescription written for Mupirocin Ointment. Patient is to apply to left great toe once daily. -Patient to continue soft, supportive shoe gear daily. -Patient/POA to call should there be question/concern in the interim.  Return in about 3 months (around 05/18/2020).

## 2020-02-22 ENCOUNTER — Encounter: Payer: Self-pay | Admitting: Nutrition

## 2020-03-04 ENCOUNTER — Other Ambulatory Visit: Payer: Self-pay

## 2020-03-04 ENCOUNTER — Ambulatory Visit: Payer: Medicare Other | Admitting: Orthotics

## 2020-03-04 DIAGNOSIS — L03119 Cellulitis of unspecified part of limb: Secondary | ICD-10-CM

## 2020-03-04 DIAGNOSIS — E1165 Type 2 diabetes mellitus with hyperglycemia: Secondary | ICD-10-CM

## 2020-03-04 DIAGNOSIS — T148XXA Other injury of unspecified body region, initial encounter: Secondary | ICD-10-CM

## 2020-03-04 DIAGNOSIS — M2141 Flat foot [pes planus] (acquired), right foot: Secondary | ICD-10-CM

## 2020-03-04 NOTE — Progress Notes (Signed)

## 2020-03-05 ENCOUNTER — Inpatient Hospital Stay (HOSPITAL_COMMUNITY): Payer: Medicare Other | Attending: Nurse Practitioner | Admitting: Nurse Practitioner

## 2020-03-05 ENCOUNTER — Other Ambulatory Visit: Payer: Self-pay

## 2020-03-05 ENCOUNTER — Inpatient Hospital Stay (HOSPITAL_COMMUNITY): Payer: Medicare Other

## 2020-03-05 DIAGNOSIS — D5919 Other autoimmune hemolytic anemia: Secondary | ICD-10-CM

## 2020-03-05 DIAGNOSIS — E1165 Type 2 diabetes mellitus with hyperglycemia: Secondary | ICD-10-CM | POA: Diagnosis not present

## 2020-03-05 DIAGNOSIS — C8330 Diffuse large B-cell lymphoma, unspecified site: Secondary | ICD-10-CM

## 2020-03-05 DIAGNOSIS — D591 Autoimmune hemolytic anemia, unspecified: Secondary | ICD-10-CM | POA: Insufficient documentation

## 2020-03-05 LAB — CBC WITH DIFFERENTIAL/PLATELET
Abs Immature Granulocytes: 0.01 10*3/uL (ref 0.00–0.07)
Basophils Absolute: 0 10*3/uL (ref 0.0–0.1)
Basophils Relative: 0 %
Eosinophils Absolute: 0.3 10*3/uL (ref 0.0–0.5)
Eosinophils Relative: 7 %
HCT: 40.3 % (ref 39.0–52.0)
Hemoglobin: 13.3 g/dL (ref 13.0–17.0)
Immature Granulocytes: 0 %
Lymphocytes Relative: 16 %
Lymphs Abs: 0.7 10*3/uL (ref 0.7–4.0)
MCH: 32.8 pg (ref 26.0–34.0)
MCHC: 33 g/dL (ref 30.0–36.0)
MCV: 99.3 fL (ref 80.0–100.0)
Monocytes Absolute: 0.8 10*3/uL (ref 0.1–1.0)
Monocytes Relative: 17 %
Neutro Abs: 2.7 10*3/uL (ref 1.7–7.7)
Neutrophils Relative %: 60 %
Platelets: 158 10*3/uL (ref 150–400)
RBC: 4.06 MIL/uL — ABNORMAL LOW (ref 4.22–5.81)
RDW: 14.3 % (ref 11.5–15.5)
WBC: 4.5 10*3/uL (ref 4.0–10.5)
nRBC: 0 % (ref 0.0–0.2)

## 2020-03-05 LAB — COMPREHENSIVE METABOLIC PANEL
ALT: 17 U/L (ref 0–44)
AST: 17 U/L (ref 15–41)
Albumin: 3.7 g/dL (ref 3.5–5.0)
Alkaline Phosphatase: 63 U/L (ref 38–126)
Anion gap: 9 (ref 5–15)
BUN: 11 mg/dL (ref 8–23)
CO2: 25 mmol/L (ref 22–32)
Calcium: 9 mg/dL (ref 8.9–10.3)
Chloride: 107 mmol/L (ref 98–111)
Creatinine, Ser: 0.86 mg/dL (ref 0.61–1.24)
GFR calc Af Amer: >60 mL/min (ref >60)
GFR calc non Af Amer: >60 mL/min (ref >60)
Glucose, Bld: 107 mg/dL — ABNORMAL HIGH (ref 70–99)
Potassium: 3.9 mmol/L (ref 3.5–5.1)
Sodium: 141 mmol/L (ref 135–145)
Total Bilirubin: 0.5 mg/dL (ref 0.3–1.2)
Total Protein: 6.3 g/dL — ABNORMAL LOW (ref 6.5–8.1)

## 2020-03-05 LAB — RETICULOCYTES
Immature Retic Fract: 27.4 % — ABNORMAL HIGH (ref 2.3–15.9)
RBC.: 4.06 MIL/uL — ABNORMAL LOW (ref 4.22–5.81)
Retic Count, Absolute: 91.8 10*3/uL (ref 19.0–186.0)
Retic Ct Pct: 2.3 % (ref 0.4–3.1)

## 2020-03-05 LAB — LACTATE DEHYDROGENASE: LDH: 162 U/L (ref 98–192)

## 2020-03-05 MED ORDER — PREDNISONE 1 MG PO TABS: 2.0000 mg | ORAL_TABLET | ORAL | 1 refills | Status: DC

## 2020-03-05 NOTE — Progress Notes (Signed)
[name], [place] [address]   CLINIC:  Medical Oncology/Hematology  PCP:  [facility], [place] [address] [phone]   REASON FOR VISIT: Follow-up for autoimmune hemolytic anemia   CURRENT THERAPY: Prednisone 2 mg every other day  BRIEF ONCOLOGIC HISTORY:  Oncology History Overview Note  S/P chemotherapy following stabilization of the spine.  Initial biopsy was on 07/13/2005 on his bone marrow.  Thoracic vertebra was biopsied on 02/04/2005 and mediastinum on 02/03/2005.  S/P R-CHOP and achieved an incomplete response, then underwent ICE chemotherapy pre-transplant and total body radiation with autologous stem cell transplant on 01/15/2006 and maintained on Rituxan for 2 years.  Thus far without recurrence.   DLBCL (diffuse large B cell lymphoma) ([place])  01/27/2005 Imaging   PET- diffuse activity  involving the L cervical area, extensive activity in the mediastinumn particularly in the R paratracheal area and to a lesser degree in the anterior mediastinum.  Lg area in porta caval nodes in upper abd and diffuse bone activity   02/03/2005 Pathology Results   Mediastinal lymph node demonstrating diffuse large cell lymphoma, B-type.  Vertebral bone biopsy demonstrating diffuse large b-cell lymphoma   03/11/2005 - 08/04/2005 Chemotherapy   R-CHOP x 8 cycles   05/04/2005 Imaging   PET- near resolution of previously identified uptake.   07/13/2005 Bone Marrow Biopsy   Bone marrow aspiration and biopsy is negative for lymphoma   08/31/2005 Imaging   PET- further response to therapy with findings consistent with residual disease.  Hypermetabolic activity identified within the anterior mediastinal lymph node (smaller but still pathologic).  Borderline hypermetabolic small right pleural effusion   10/20/2005 - 11/13/2005 Chemotherapy   R-ICE x 2 cycles at [facility]   01/08/2006 - 01/09/2006 Chemotherapy   Cyclophosphamide 600 mg  daily x 2   01/10/2006 - 01/14/2006 Radiation Therapy   Total body radiation twice daily   01/15/2006 Bone Marrow Transplant   Stem cell transplant at [facility]   04/05/2006 - 12/05/2007 Chemotherapy   Rituxan weekly x 4 every 6 months x 2 years     CANCER STAGING: Cancer Staging DLBCL (diffuse large B cell lymphoma) (HCC) Staging form: Lymphoid Neoplasms, AJCC 6th Edition - Clinical: Stage IV - Signed by Baird Cancer, PA on 04/29/2011    INTERVAL HISTORY:  [name] 69 y.o. male returns for routine follow-up for autoimmune hemolytic anemia.  Patient reports he is feeling much better now that his prednisone dosages are decreased.  He denies any bleeding issues. Denies any nausea, vomiting, or diarrhea. Denies any new pains. Had not noticed any recent bleeding such as epistaxis, hematuria or hematochezia. Denies recent chest pain on exertion, shortness of breath on minimal exertion, pre-syncopal episodes, or palpitations. Denies any numbness or tingling in hands or feet. Denies any recent fevers, infections, or recent hospitalizations. Patient reports appetite at 50% and energy level at 50%.     REVIEW OF SYSTEMS:  Review of Systems  All other systems reviewed and are negative.    PAST MEDICAL/SURGICAL HISTORY:  Past Medical History:  Diagnosis Date  . Asthma    as child  . Diabetes mellitus without complication ([place])   . DLBCL (diffuse large B cell lymphoma) ([place]) 02/28/2009  . Edema   . Heart failure, diastolic, chronic ([place])    patient denies  . Hyperlipidemia, mixed   . Hypertension   . IDA (iron deficiency anemia)   . Large cell lymphoma ([place]) 01/2005  autologous stem cell transplant 12/2005  . Obesity, morbid (more than 100 lbs over ideal weight or BMI > 40) (HCC)   . Venous insufficiency 04/29/2011   Past Surgical History:  Procedure Laterality Date  . BACK SURGERY  2006   T6 vertebrae removed/titanium placed  . CATARACT EXTRACTION W/PHACO Right 03/31/2019    Procedure: CATARACT EXTRACTION PHACO AND INTRAOCULAR LENS PLACEMENT (IOC);  Surgeon: Baruch Goldmann, MD;  Location: [facility];  Service: Ophthalmology;  Laterality: Right;  CDE: 3.21  . CATARACT EXTRACTION W/PHACO Left 04/14/2019   Procedure: CATARACT EXTRACTION PHACO AND INTRAOCULAR LENS PLACEMENT (IOC);  Surgeon: Baruch Goldmann, MD;  Location: [facility];  Service: Ophthalmology;  Laterality: Left;  left - pt knows to arrive at 7:45, CDE: 3.55  . COLONOSCOPY  11/24/2004   Polyps in the left colon ablated/removed as described above.  Two submucosal lesions consistent with lipomas as described above not  manipulated./ Normal rectum  . COLONOSCOPY  06/20/2012   MULTIPLE RECTAL AND COLONIC POLYPS  . COLONOSCOPY N/A 03/08/2015   RMR: Capacious, redundant colon. Multiple colonic and rectosigmoid polyps removed. ablated as described above. colonic lipoma abnormal appearing terminal ileum likely a variant of normal). Howeverwith history  biopsies obtained.   . ESOPHAGOGASTRODUODENOSCOPY N/A 03/08/2015   RMR: Hiatal hernia Polypoid gastric mucosa with multiple gastric polyps. largest polyp removed via snare polypectomgy hemostasis clip placed at base. Status post gastric biopsy. Status post video capsule placement.   Marland Kitchen GIVENS CAPSULE STUDY N/A 03/08/2015   Procedure: GIVENS CAPSULE STUDY;  Surgeon: Daneil Dolin, MD;  Location: [facility];  Service: Endoscopy;  Laterality: N/A;  . LIMBAL STEM CELL TRANSPLANT    . LUNG BIOPSY  6/06  . MULTIPLE TOOTH EXTRACTIONS  04/2005  . port a cath placement    . PORT-A-CATH REMOVAL  09/08/2012   Procedure: REMOVAL PORT-A-CATH;  Surgeon: Melrose Nakayama, MD;  Location: [facility];  Service: Thoracic;  Laterality: N/A;     SOCIAL HISTORY:  Social History   Socioeconomic History  . Marital status: Married    Spouse name: Not on file  . Number of children: 2  . Years of education: Not on file  . Highest education level: Not on file  Occupational History  . Occupation:  disability due to back    Employer: UNEMPLOYED  Tobacco Use  . Smoking status: Former Smoker    Packs/day: 0.50    Years: 15.00    Pack years: 7.50    Quit date: 11/11/2002    Years since quitting: 17.3  . Smokeless tobacco: Never Used  Vaping Use  . Vaping Use: Never used  Substance and Sexual Activity  . Alcohol use: No    Alcohol/week: 0.0 standard drinks  . Drug use: No  . Sexual activity: Yes    Birth control/protection: None  Other Topics Concern  . Not on file  Social History Narrative   Married   No regular exercise   Social Determinants of Health   Financial Resource Strain:   . Difficulty of Paying Living Expenses:   Food Insecurity:   . Worried About Charity fundraiser in the Last Year:   . Arboriculturist in the Last Year:   Transportation Needs:   . Film/video editor (Medical):   Marland Kitchen Lack of Transportation (Non-Medical):   Physical Activity:   . Days of Exercise per Week:   . Minutes of Exercise per Session:   Stress:   . Feeling of Stress :  Social Connections:   . Frequency of Communication with Friends and Family:   . Frequency of Social Gatherings with Friends and Family:   . Attends Religious Services:   . Active Member of Clubs or Organizations:   . Attends Archivist Meetings:   Marland Kitchen Marital Status:   Intimate Partner Violence:   . Fear of Current or Ex-Partner:   . Emotionally Abused:   Marland Kitchen Physically Abused:   . Sexually Abused:     FAMILY HISTORY:  Family History  Problem Relation Age of Onset  . Cancer Mother        lung  . Hypertension Mother   . Hyperlipidemia Mother   . Cancer Father        prostate  . Hypertension Father   . Colon cancer Neg Hx   . Diabetes Neg Hx     CURRENT MEDICATIONS:  Outpatient Encounter Medications as of 03/05/2020  Medication Sig  . apixaban (ELIQUIS) 5 MG TABS tablet Take 1 tablet (5 mg total) by mouth 2 (two) times daily.  . Blood Glucose Monitoring Suppl (ONE TOUCH ULTRA 2) w/Device  KIT USE TO TEST BLOOD SUGAR ONCE D UTD  . ergocalciferol (VITAMIN D2) 1.25 MG (50000 UT) capsule Take 1 capsule (50,000 Units total) by mouth once a week.  . folic acid (FOLVITE) 1 MG tablet TAKE 1 TABLET(1 MG) BY MOUTH DAILY  . insulin degludec (TRESIBA FLEXTOUCH) 100 UNIT/ML FlexTouch Pen Inject 0.35 mLs (35 Units total) into the skin at bedtime.  . Insulin Pen Needle (B-D ULTRAFINE III SHORT PEN) 31G X 8 MM MISC 1 each by Does not apply route daily. Use once a day as directed  . Lancets (ONETOUCH DELICA PLUS [id number]) MISC USE TO TEST BLOOD SUGAR ONCE D  . metFORMIN (GLUCOPHAGE) 500 MG tablet Take 1 tablet (500 mg total) by mouth 2 (two) times daily with a meal.  . ONETOUCH ULTRA test strip USE TO TEST BLOOD SUGAR  4 TIMES A DAY (Patient taking differently: USE TO TEST BLOOD SUGAR 2 TIMES A DAY)  . predniSONE (DELTASONE) 1 MG tablet Take 2 tablets (2 mg total) by mouth every other day.  . predniSONE (DELTASONE) 5 MG tablet Take one tablet PO every other day  . rosuvastatin (CRESTOR) 40 MG tablet Take 40 mg by mouth daily.  . [DISCONTINUED] predniSONE (DELTASONE) 1 MG tablet Take 2 tablets (2 mg total) by mouth every other day.  . clotrimazole-betamethasone (LOTRISONE) cream Apply 1 application topically 2 (two) times daily as needed (leg infection).  (Patient not taking: Reported on 03/05/2020)  . furosemide (LASIX) 20 MG tablet Take 20 mg by mouth daily as needed for fluid or edema.  (Patient not taking: Reported on 03/05/2020)  . metoprolol tartrate (LOPRESSOR) 25 MG tablet Take 1 tablet (25 mg total) by mouth 2 (two) times daily.   No facility-administered encounter medications on file as of 03/05/2020.    ALLERGIES:  Allergies  Allergen Reactions  . Penicillins Rash    Has patient had a PCN reaction causing immediate rash, facial/tongue/throat swelling, SOB or lightheadedness with hypotension: Unknown Has patient had a PCN reaction causing severe rash involving mucus membranes or skin  necrosis: Unknown Has patient had a PCN reaction that required hospitalization: Unknown Has patient had a PCN reaction occurring within the last 10 years: No If all of the above answers are "NO", then may proceed with Cephalosporin use.      PHYSICAL EXAM:  ECOG Performance status: 1  Vitals:  03/05/20 1007  BP: 131/60  Pulse: 71  Resp: 20  Temp: 97.7 F (36.5 C)  SpO2: 98%   Filed Weights   03/05/20 1007  Weight: 281 lb 8.4 oz (127.7 kg)   Physical Exam Constitutional:      Appearance: Normal appearance. He is normal weight.  Cardiovascular:     Rate and Rhythm: Normal rate and regular rhythm.     Heart sounds: Normal heart sounds.  Pulmonary:     Effort: Pulmonary effort is normal.     Breath sounds: Normal breath sounds.  Abdominal:     General: Bowel sounds are normal.     Palpations: Abdomen is soft.  Musculoskeletal:        General: Normal range of motion.  Skin:    General: Skin is warm.  Neurological:     Mental Status: He is alert and oriented to person, place, and time. Mental status is at baseline.  Psychiatric:        Mood and Affect: Mood normal.        Behavior: Behavior normal.        Thought Content: Thought content normal.        Judgment: Judgment normal.      LABORATORY DATA:  I have reviewed the labs as listed.  CBC    Component Value Date/Time   WBC 4.5 03/05/2020 0900   RBC 4.06 (L) 03/05/2020 0900   RBC 4.06 (L) 03/05/2020 0900   HGB 13.3 03/05/2020 0900   HCT 40.3 03/05/2020 0900   PLT 158 03/05/2020 0900   MCV 99.3 03/05/2020 0900   MCH 32.8 03/05/2020 0900   MCHC 33.0 03/05/2020 0900   RDW 14.3 03/05/2020 0900   LYMPHSABS 0.7 03/05/2020 0900   MONOABS 0.8 03/05/2020 0900   EOSABS 0.3 03/05/2020 0900   BASOSABS 0.0 03/05/2020 0900   CMP Latest Ref Rng & Units 03/05/2020 02/12/2020 01/19/2020  Glucose 70 - 99 mg/dL 107(H) 110(H) 104(H)  BUN 8 - 23 mg/dL 11 12 10   Creatinine 0.61 - 1.24 mg/dL 0.86 0.89 0.86  Sodium 135 -  145 mmol/L 141 141 141  Potassium 3.5 - 5.1 mmol/L 3.9 3.7 3.6  Chloride 98 - 111 mmol/L 107 107 105  CO2 22 - 32 mmol/L 25 27 25   Calcium 8.9 - 10.3 mg/dL 9.0 9.0 9.0  Total Protein 6.5 - 8.1 g/dL 6.3(L) 6.3(L) 6.4(L)  Total Bilirubin 0.3 - 1.2 mg/dL 0.5 0.4 0.5  Alkaline Phos 38 - 126 U/L 63 55 60  AST 15 - 41 U/L 17 18 19   ALT 0 - 44 U/L 17 17 23    All questions were answered to patient's stated satisfaction. Encouraged patient to call with any new concerns or questions before his next visit to the cancer center and we can certain see him sooner, if needed.     ASSESSMENT & PLAN:  DLBCL (diffuse large B cell lymphoma) ([place]) 1.  Autoimmune hemolytic anemia: -Prednisone 120 mg daily started on 08/05/2019, rituximab 4 doses from 08/15/2019 through 09/05/2019. -He was last seen on 12/22/2019 and the plan was to cut prednisone to 5 mg daily for 2 weeks followed by 5 mg every other day for the next 2 weeks. -We gave 2 IV iron infusions on 01/05/2020 and 01/12/2020 due to his ferritin being 92% saturation 18. -He was taking 2.5 mg of prednisone every other day. -Labs done on 03/05/2020 showed LDH 162, hemoglobin 13.3, MCV 99.3, platelets 158, reticulocyte 2.3 -He will start 2 mg of  prednisone every other day. -We will see him back in 4 weeks with repeat labs.  2.  Leg swelling: -He will use Lasix 20 mg daily as needed.  3.  Poorly controlled diabetes: -Glucose today is 110.  He is taking Amaryl 2 mg twice daily. -Patient reports it is hard to control his diabetes with the prednisone. -Hemoglobin A1c 8.2  4.  DLBCL: -Diagnosed in 2006, treated with R-CHOP with complete response on PET scan. -Relapse of lymphoma on 08/31/2005, treated with salvage chemotherapy followed by stem cell transplant on 01/15/2006. -Rituximab weekly x4 followed by every 6 months for 2 years completed on 12/05/2007. -PET scan on 08/14/2019 did not show any evidence or recurrence of lymphoma. -No B symptoms at this  time.     Orders placed this encounter:  Orders Placed This Encounter  Procedures  . Lactate dehydrogenase  . CBC with Differential/Platelet  . Comprehensive metabolic panel  . Reticulocytes      Francene Finders, FNP-C [employer] [phone]

## 2020-03-05 NOTE — Assessment & Plan Note (Signed)
1.  Autoimmune hemolytic anemia: -Prednisone 120 mg daily started on 08/05/2019, rituximab 4 doses from 08/15/2019 through 09/05/2019. -He was last seen on 12/22/2019 and the plan was to cut prednisone to 5 mg daily for 2 weeks followed by 5 mg every other day for the next 2 weeks. -We gave 2 IV iron infusions on 01/05/2020 and 01/12/2020 due to his ferritin being 92% saturation 18. -He was taking 2.5 mg of prednisone every other day. -Labs done on 03/05/2020 showed LDH 162, hemoglobin 13.3, MCV 99.3, platelets 158, reticulocyte 2.3 -He will start 2 mg of prednisone every other day. -We will see him back in 4 weeks with repeat labs.  2.  Leg swelling: -He will use Lasix 20 mg daily as needed.  3.  Poorly controlled diabetes: -Glucose today is 110.  He is taking Amaryl 2 mg twice daily. -Patient reports it is hard to control his diabetes with the prednisone. -Hemoglobin A1c 8.2  4.  DLBCL: -Diagnosed in 2006, treated with R-CHOP with complete response on PET scan. -Relapse of lymphoma on 08/31/2005, treated with salvage chemotherapy followed by stem cell transplant on 01/15/2006. -Rituximab weekly x4 followed by every 6 months for 2 years completed on 12/05/2007. -PET scan on 08/14/2019 did not show any evidence or recurrence of lymphoma. -No B symptoms at this time.

## 2020-03-14 ENCOUNTER — Other Ambulatory Visit: Payer: Self-pay | Admitting: "Endocrinology

## 2020-03-19 ENCOUNTER — Other Ambulatory Visit: Payer: Self-pay | Admitting: "Endocrinology

## 2020-03-22 DIAGNOSIS — Z87891 Personal history of nicotine dependence: Secondary | ICD-10-CM | POA: Diagnosis not present

## 2020-03-22 DIAGNOSIS — I4891 Unspecified atrial fibrillation: Secondary | ICD-10-CM | POA: Diagnosis not present

## 2020-03-22 DIAGNOSIS — E1129 Type 2 diabetes mellitus with other diabetic kidney complication: Secondary | ICD-10-CM | POA: Diagnosis not present

## 2020-03-22 DIAGNOSIS — I2510 Atherosclerotic heart disease of native coronary artery without angina pectoris: Secondary | ICD-10-CM | POA: Diagnosis not present

## 2020-03-27 ENCOUNTER — Telehealth: Payer: Self-pay | Admitting: Podiatry

## 2020-03-27 DIAGNOSIS — E1129 Type 2 diabetes mellitus with other diabetic kidney complication: Secondary | ICD-10-CM | POA: Diagnosis not present

## 2020-03-27 DIAGNOSIS — L84 Corns and callosities: Secondary | ICD-10-CM | POA: Diagnosis not present

## 2020-03-27 DIAGNOSIS — I872 Venous insufficiency (chronic) (peripheral): Secondary | ICD-10-CM | POA: Diagnosis not present

## 2020-03-27 NOTE — Telephone Encounter (Signed)
Pt left message checking on diabetic shoe paperwork. Pt has the fax number where it needed to go.  I returned call and pt has not seen pcp since January but has seen Dr Dorris Fetch (Endo) so I am faxing the paperwork to Dr Liliane Channel office

## 2020-04-11 ENCOUNTER — Inpatient Hospital Stay (HOSPITAL_COMMUNITY): Payer: Medicare Other

## 2020-04-11 ENCOUNTER — Other Ambulatory Visit: Payer: Self-pay

## 2020-04-11 ENCOUNTER — Inpatient Hospital Stay (HOSPITAL_COMMUNITY): Payer: Medicare Other | Attending: Nurse Practitioner | Admitting: Nurse Practitioner

## 2020-04-11 VITALS — BP 132/69 | HR 50 | Temp 96.9°F | Resp 20 | Wt 272.7 lb

## 2020-04-11 DIAGNOSIS — Z8249 Family history of ischemic heart disease and other diseases of the circulatory system: Secondary | ICD-10-CM | POA: Insufficient documentation

## 2020-04-11 DIAGNOSIS — Z801 Family history of malignant neoplasm of trachea, bronchus and lung: Secondary | ICD-10-CM | POA: Insufficient documentation

## 2020-04-11 DIAGNOSIS — Z87891 Personal history of nicotine dependence: Secondary | ICD-10-CM | POA: Diagnosis not present

## 2020-04-11 DIAGNOSIS — M7989 Other specified soft tissue disorders: Secondary | ICD-10-CM | POA: Insufficient documentation

## 2020-04-11 DIAGNOSIS — C8330 Diffuse large B-cell lymphoma, unspecified site: Secondary | ICD-10-CM | POA: Diagnosis not present

## 2020-04-11 DIAGNOSIS — Z79899 Other long term (current) drug therapy: Secondary | ICD-10-CM | POA: Insufficient documentation

## 2020-04-11 DIAGNOSIS — D5919 Other autoimmune hemolytic anemia: Secondary | ICD-10-CM | POA: Diagnosis not present

## 2020-04-11 DIAGNOSIS — Z8349 Family history of other endocrine, nutritional and metabolic diseases: Secondary | ICD-10-CM | POA: Insufficient documentation

## 2020-04-11 DIAGNOSIS — D591 Autoimmune hemolytic anemia, unspecified: Secondary | ICD-10-CM | POA: Insufficient documentation

## 2020-04-11 DIAGNOSIS — Z8042 Family history of malignant neoplasm of prostate: Secondary | ICD-10-CM | POA: Diagnosis not present

## 2020-04-11 DIAGNOSIS — R05 Cough: Secondary | ICD-10-CM | POA: Diagnosis not present

## 2020-04-11 DIAGNOSIS — E119 Type 2 diabetes mellitus without complications: Secondary | ICD-10-CM | POA: Insufficient documentation

## 2020-04-11 DIAGNOSIS — E559 Vitamin D deficiency, unspecified: Secondary | ICD-10-CM | POA: Diagnosis not present

## 2020-04-11 DIAGNOSIS — I10 Essential (primary) hypertension: Secondary | ICD-10-CM | POA: Diagnosis not present

## 2020-04-11 DIAGNOSIS — Z8572 Personal history of non-Hodgkin lymphomas: Secondary | ICD-10-CM | POA: Insufficient documentation

## 2020-04-11 LAB — CBC WITH DIFFERENTIAL/PLATELET
Abs Immature Granulocytes: 0.02 10*3/uL (ref 0.00–0.07)
Basophils Absolute: 0 10*3/uL (ref 0.0–0.1)
Basophils Relative: 1 %
Eosinophils Absolute: 0.2 10*3/uL (ref 0.0–0.5)
Eosinophils Relative: 6 %
HCT: 35.5 % — ABNORMAL LOW (ref 39.0–52.0)
Hemoglobin: 11.6 g/dL — ABNORMAL LOW (ref 13.0–17.0)
Immature Granulocytes: 1 %
Lymphocytes Relative: 21 %
Lymphs Abs: 0.6 10*3/uL — ABNORMAL LOW (ref 0.7–4.0)
MCH: 31.5 pg (ref 26.0–34.0)
MCHC: 32.7 g/dL (ref 30.0–36.0)
MCV: 96.5 fL (ref 80.0–100.0)
Monocytes Absolute: 0.6 10*3/uL (ref 0.1–1.0)
Monocytes Relative: 22 %
Neutro Abs: 1.4 10*3/uL — ABNORMAL LOW (ref 1.7–7.7)
Neutrophils Relative %: 49 %
Platelets: 220 10*3/uL (ref 150–400)
RBC: 3.68 MIL/uL — ABNORMAL LOW (ref 4.22–5.81)
RDW: 12.8 % (ref 11.5–15.5)
WBC: 2.8 10*3/uL — ABNORMAL LOW (ref 4.0–10.5)
nRBC: 0 % (ref 0.0–0.2)

## 2020-04-11 LAB — COMPREHENSIVE METABOLIC PANEL
ALT: 20 U/L (ref 0–44)
AST: 19 U/L (ref 15–41)
Albumin: 3.6 g/dL (ref 3.5–5.0)
Alkaline Phosphatase: 65 U/L (ref 38–126)
Anion gap: 11 (ref 5–15)
BUN: 11 mg/dL (ref 8–23)
CO2: 26 mmol/L (ref 22–32)
Calcium: 9 mg/dL (ref 8.9–10.3)
Chloride: 106 mmol/L (ref 98–111)
Creatinine, Ser: 1 mg/dL (ref 0.61–1.24)
GFR calc Af Amer: >60 mL/min (ref >60)
GFR calc non Af Amer: >60 mL/min (ref >60)
Glucose, Bld: 110 mg/dL — ABNORMAL HIGH (ref 70–99)
Potassium: 3.4 mmol/L — ABNORMAL LOW (ref 3.5–5.1)
Sodium: 143 mmol/L (ref 135–145)
Total Bilirubin: 0.8 mg/dL (ref 0.3–1.2)
Total Protein: 6.1 g/dL — ABNORMAL LOW (ref 6.5–8.1)

## 2020-04-11 LAB — DIRECT ANTIGLOBULIN TEST (NOT AT ARMC)
DAT, IgG: POSITIVE
DAT, complement: NEGATIVE

## 2020-04-11 LAB — RETICULOCYTES
Immature Retic Fract: 1.7 % — ABNORMAL LOW (ref 2.3–15.9)
RBC.: 3.65 MIL/uL — ABNORMAL LOW (ref 4.22–5.81)
Retic Ct Pct: <0.4 % — ABNORMAL LOW (ref 0.4–3.1)

## 2020-04-11 LAB — LACTATE DEHYDROGENASE: LDH: 216 U/L — ABNORMAL HIGH (ref 98–192)

## 2020-04-11 MED ORDER — ERGOCALCIFEROL 1.25 MG (50000 UT) PO CAPS: 50000.0000 [IU] | ORAL_CAPSULE | ORAL | 3 refills | Status: DC

## 2020-04-11 NOTE — Assessment & Plan Note (Addendum)
1.  Autoimmune hemolytic anemia: -Prednisone 120 mg daily started on 08/05/2019, rituximab 4 doses from 08/15/2019 through 09/05/2019. -He was last seen on 12/22/2019 and the plan was to cut prednisone to 5 mg daily for 2 weeks followed by 5 mg every other day for the next 2 weeks. -We gave 2 IV iron infusions on 01/05/2020 and 01/12/2020 due to his ferritin being 92% saturation 18. -He has been off of his prednisone for a little over 2 weeks now. -Labs done on 04/11/2020 showed hemoglobin 11.6, WBC 2.8, MCV 96.5, platelets 220, reticulocytes 0.4. -We will check [place] test today -We will see him back in 1 week and check nutritional deficiency labs  2.  Leg swelling: -He will use Lasix 20 mg daily as needed.  3.  Poorly controlled diabetes: -Glucose today is 110.  He is taking Amaryl 2 mg twice daily. -Patient reports it is hard to control his diabetes with the prednisone. -Hemoglobin A1c 8.2  4.  DLBCL: -Diagnosed in 2006, treated with R-CHOP with complete response on PET scan. -Relapse of lymphoma on 08/31/2005, treated with salvage chemotherapy followed by stem cell transplant on 01/15/2006. -Rituximab weekly x4 followed by every 6 months for 2 years completed on 12/05/2007. -PET scan on 08/14/2019 did not show any evidence or recurrence of lymphoma. -No B symptoms at this time.

## 2020-04-11 NOTE — Progress Notes (Signed)
[place], [place] [address]   CLINIC:  Medical Oncology/Hematology  PCP:  [facility], [place] [address] [phone]   REASON FOR VISIT: Follow-up for autoimmune hemolytic anemia   CURRENT THERAPY:currently stop prednisone at this time  BRIEF ONCOLOGIC HISTORY:  Oncology History Overview Note  S/P chemotherapy following stabilization of the spine.  Initial biopsy was on 07/13/2005 on his bone marrow.  Thoracic vertebra was biopsied on 02/04/2005 and mediastinum on 02/03/2005.  S/P R-CHOP and achieved an incomplete response, then underwent ICE chemotherapy pre-transplant and total body radiation with autologous stem cell transplant on 01/15/2006 and maintained on Rituxan for 2 years.  Thus far without recurrence.   DLBCL (diffuse large B cell lymphoma) ([place])  01/27/2005 Imaging   PET- diffuse activity  involving the L cervical area, extensive activity in the mediastinumn particularly in the R paratracheal area and to a lesser degree in the anterior mediastinum.  Lg area in porta caval nodes in upper abd and diffuse bone activity   02/03/2005 Pathology Results   Mediastinal lymph node demonstrating diffuse large cell lymphoma, B-type.  Vertebral bone biopsy demonstrating diffuse large b-cell lymphoma   03/11/2005 - 08/04/2005 Chemotherapy   R-CHOP x 8 cycles   05/04/2005 Imaging   PET- near resolution of previously identified uptake.   07/13/2005 Bone Marrow Biopsy   Bone marrow aspiration and biopsy is negative for lymphoma   08/31/2005 Imaging   PET- further response to therapy with findings consistent with residual disease.  Hypermetabolic activity identified within the anterior mediastinal lymph node (smaller but still pathologic).  Borderline hypermetabolic small right pleural effusion   10/20/2005 - 11/13/2005 Chemotherapy   R-ICE x 2 cycles at [facility]   01/08/2006 - 01/09/2006 Chemotherapy   Cyclophosphamide 600  mg daily x 2   01/10/2006 - 01/14/2006 Radiation Therapy   Total body radiation twice daily   01/15/2006 Bone Marrow Transplant   Stem cell transplant at [facility]   04/05/2006 - 12/05/2007 Chemotherapy   Rituxan weekly x 4 every 6 months x 2 years     CANCER STAGING: Cancer Staging DLBCL (diffuse large B cell lymphoma) (HCC) Staging form: Lymphoid Neoplasms, AJCC 6th Edition - Clinical: Stage IV - Signed by Baird Cancer, PA on 04/29/2011    INTERVAL HISTORY:  Mr. Prill 69 y.o. male returns for routine follow-up for autoimmune hemolytic anemia.  Patient reports he is doing well since last visit.  He denies any reports of bleeding or fatigue. Denies any nausea, vomiting, or diarrhea. Denies any new pains. Had not noticed any recent bleeding such as epistaxis, hematuria or hematochezia. Denies recent chest pain on exertion, shortness of breath on minimal exertion, pre-syncopal episodes, or palpitations. Denies any numbness or tingling in hands or feet. Denies any recent fevers, infections, or recent hospitalizations. Patient reports appetite at 75% and energy level at 75%.     REVIEW OF SYSTEMS:  Review of Systems  Respiratory: Positive for cough.   All other systems reviewed and are negative.    PAST MEDICAL/SURGICAL HISTORY:  Past Medical History:  Diagnosis Date   Asthma    as child   Diabetes mellitus without complication ([place])    DLBCL (diffuse large B cell lymphoma) ([place]) 02/28/2009   Edema    Heart failure, diastolic, chronic ([place])    patient denies   Hyperlipidemia, mixed    Hypertension    IDA (iron deficiency anemia)    Large cell lymphoma ([place])  01/2005   autologous stem cell transplant 12/2005   Obesity, morbid (more than 100 lbs over ideal weight or BMI > 40) ([place])    Venous insufficiency 04/29/2011   Past Surgical History:  Procedure Laterality Date   BACK SURGERY  2006   T6 vertebrae removed/titanium placed   CATARACT EXTRACTION W/PHACO Right  03/31/2019   Procedure: CATARACT EXTRACTION PHACO AND INTRAOCULAR LENS PLACEMENT ([place]);  Surgeon: Baruch Goldmann, MD;  Location: [facility];  Service: Ophthalmology;  Laterality: Right;  CDE: 3.21   CATARACT EXTRACTION W/PHACO Left 04/14/2019   Procedure: CATARACT EXTRACTION PHACO AND INTRAOCULAR LENS PLACEMENT ([facility]);  Surgeon: Baruch Goldmann, MD;  Location: [facility];  Service: Ophthalmology;  Laterality: Left;  left - pt knows to arrive at 7:45, CDE: 3.55   COLONOSCOPY  11/24/2004   Polyps in the left colon ablated/removed as described above.  Two submucosal lesions consistent with lipomas as described above not  manipulated./ Normal rectum   COLONOSCOPY  06/20/2012   MULTIPLE RECTAL AND COLONIC POLYPS   COLONOSCOPY N/A 03/08/2015   RMR: Capacious, redundant colon. Multiple colonic and rectosigmoid polyps removed. ablated as described above. colonic lipoma abnormal appearing terminal ileum likely a variant of normal). Howeverwith history  biopsies obtained.    ESOPHAGOGASTRODUODENOSCOPY N/A 03/08/2015   RMR: Hiatal hernia Polypoid gastric mucosa with multiple gastric polyps. largest polyp removed via snare polypectomgy hemostasis clip placed at base. Status post gastric biopsy. Status post video capsule placement.    GIVENS CAPSULE STUDY N/A 03/08/2015   Procedure: GIVENS CAPSULE STUDY;  Surgeon: Daneil Dolin, MD;  Location: [facility];  Service: Endoscopy;  Laterality: N/A;   LIMBAL STEM CELL TRANSPLANT     LUNG BIOPSY  6/06   MULTIPLE TOOTH EXTRACTIONS  04/2005   port a cath placement     PORT-A-CATH REMOVAL  09/08/2012   Procedure: REMOVAL PORT-A-CATH;  Surgeon: Melrose Nakayama, MD;  Location: [facility] OR;  Service: Thoracic;  Laterality: N/A;     SOCIAL HISTORY:  Social History   Socioeconomic History   Marital status: Married    Spouse name: Not on file   Number of children: 2   Years of education: Not on file   Highest education level: Not on file  Occupational History    Occupation: disability due to back    Employer: UNEMPLOYED  Tobacco Use   Smoking status: Former Smoker    Packs/day: 0.50    Years: 15.00    Pack years: 7.50    Quit date: 11/11/2002    Years since quitting: 17.4   Smokeless tobacco: Never Used  Scientific laboratory technician Use: Never used  Substance and Sexual Activity   Alcohol use: No    Alcohol/week: 0.0 standard drinks   Drug use: No   Sexual activity: Yes    Birth control/protection: None  Other Topics Concern   Not on file  Social History Narrative   Married   No regular exercise   Social Determinants of Health   Financial Resource Strain:    Difficulty of Paying Living Expenses: Not on file  Food Insecurity:    Worried About Charity fundraiser in the Last Year: Not on file   [employer] of Food in the Last Year: Not on file  Transportation Needs:    Lack of Transportation (Medical): Not on file   Lack of Transportation (Non-Medical): Not on file  Physical Activity:    Days of Exercise per Week: Not on file  Minutes of Exercise per Session: Not on file  Stress:    Feeling of Stress : Not on file  Social Connections:    Frequency of Communication with Friends and Family: Not on file   Frequency of Social Gatherings with Friends and Family: Not on file   Attends Religious Services: Not on file   Active Member of Clubs or Organizations: Not on file   Attends Archivist Meetings: Not on file   Marital Status: Not on file  Intimate Partner Violence:    Fear of Current or Ex-Partner: Not on file   Emotionally Abused: Not on file   Physically Abused: Not on file   Sexually Abused: Not on file    FAMILY HISTORY:  Family History  Problem Relation Age of Onset   Cancer Mother        lung   Hypertension Mother    Hyperlipidemia Mother    Cancer Father        prostate   Hypertension Father    Colon cancer Neg Hx    Diabetes Neg Hx     CURRENT MEDICATIONS:  Outpatient  Encounter Medications as of 04/11/2020  Medication Sig   apixaban (ELIQUIS) 5 MG TABS tablet Take 1 tablet (5 mg total) by mouth 2 (two) times daily.   Blood Glucose Monitoring Suppl (ONE TOUCH ULTRA 2) w/Device KIT USE TO TEST BLOOD SUGAR ONCE D UTD   clotrimazole-betamethasone (LOTRISONE) cream Apply 1 application topically 2 (two) times daily as needed (leg infection).    ergocalciferol (VITAMIN D2) 1.25 MG (50000 UT) capsule Take 1 capsule (50,000 Units total) by mouth once a week.   folic acid (FOLVITE) 1 MG tablet TAKE 1 TABLET(1 MG) BY MOUTH DAILY   furosemide (LASIX) 20 MG tablet Take 20 mg by mouth daily as needed for fluid or edema.    insulin degludec (TRESIBA FLEXTOUCH) 100 UNIT/ML FlexTouch Pen Inject 0.35 mLs (35 Units total) into the skin at bedtime.   Insulin Pen Needle (B-D ULTRAFINE III SHORT PEN) 31G X 8 MM MISC 1 each by Does not apply route daily. Use once a day as directed   Lancets (ONETOUCH DELICA PLUS [id number]) MISC USE TO TEST BLOOD SUGAR ONCE D   metFORMIN (GLUCOPHAGE) 500 MG tablet TAKE 1 TABLET(500 MG) BY MOUTH TWICE DAILY WITH A MEAL   ONETOUCH ULTRA test strip USE TO TEST BLOOD SUGAR 2 TIMES A DAY   rosuvastatin (CRESTOR) 40 MG tablet Take 40 mg by mouth daily.   [DISCONTINUED] predniSONE (DELTASONE) 1 MG tablet Take 2 tablets (2 mg total) by mouth every other day.   metoprolol tartrate (LOPRESSOR) 25 MG tablet Take 1 tablet (25 mg total) by mouth 2 (two) times daily.   [DISCONTINUED] predniSONE (DELTASONE) 5 MG tablet Take one tablet PO every other day (Patient not taking: Reported on 04/11/2020)   No facility-administered encounter medications on file as of 04/11/2020.    ALLERGIES:  Allergies  Allergen Reactions   Penicillins Rash    Has patient had a PCN reaction causing immediate rash, facial/tongue/throat swelling, SOB or lightheadedness with hypotension: Unknown Has patient had a PCN reaction causing severe rash involving mucus membranes  or skin necrosis: Unknown Has patient had a PCN reaction that required hospitalization: Unknown Has patient had a PCN reaction occurring within the last 10 years: No If all of the above answers are "NO", then may proceed with Cephalosporin use.      PHYSICAL EXAM:  ECOG Performance status: 1  Vitals:  04/11/20 0913  BP: 132/69  Pulse: (!) 50  Resp: 20  Temp: (!) 96.9 F (36.1 C)  SpO2: 99%   Filed Weights   04/11/20 0913  Weight: 272 lb 11.3 oz (123.7 kg)     Physical Exam Constitutional:      Appearance: Normal appearance. He is normal weight.  Cardiovascular:     Rate and Rhythm: Normal rate and regular rhythm.     Heart sounds: Normal heart sounds.  Pulmonary:     Breath sounds: Normal breath sounds.  Abdominal:     General: Bowel sounds are normal.     Palpations: Abdomen is soft.  Musculoskeletal:        General: Normal range of motion.  Skin:    General: Skin is warm.  Neurological:     Mental Status: He is alert and oriented to person, place, and time. Mental status is at baseline.  Psychiatric:        Mood and Affect: Mood normal.        Behavior: Behavior normal.        Thought Content: Thought content normal.        Judgment: Judgment normal.      LABORATORY DATA:  I have reviewed the labs as listed.  CBC    Component Value Date/Time   WBC 2.8 (L) 04/11/2020 0900   RBC 3.68 (L) 04/11/2020 0900   RBC 3.65 (L) 04/11/2020 0900   HGB 11.6 (L) 04/11/2020 0900   HCT 35.5 (L) 04/11/2020 0900   PLT 220 04/11/2020 0900   MCV 96.5 04/11/2020 0900   MCH 31.5 04/11/2020 0900   MCHC 32.7 04/11/2020 0900   RDW 12.8 04/11/2020 0900   LYMPHSABS 0.6 (L) 04/11/2020 0900   MONOABS 0.6 04/11/2020 0900   EOSABS 0.2 04/11/2020 0900   BASOSABS 0.0 04/11/2020 0900   CMP Latest Ref Rng & Units 04/11/2020 03/05/2020 02/12/2020  Glucose 70 - 99 mg/dL 110(H) 107(H) 110(H)  BUN 8 - 23 mg/dL _0 Creatinine 0.61 - 1.24 mg/dL 1.00 0.86 0.89  Sodium 135 - 145  mmol/L 143 141 141  Potassium 3.5 - 5.1 mmol/L 3.4(L) 3.9 3.7  Chloride 98 - 111 mmol/L 106 107 107  CO2 22 - 32 mmol/L _1 Calcium 8.9 - 10.3 mg/dL 9.0 9.0 9.0  Total Protein 6.5 - 8.1 g/dL 6.1(L) 6.3(L) 6.3(L)  Total Bilirubin 0.3 - 1.2 mg/dL 0.8 0.5 0.4  Alkaline Phos 38 - 126 U/L 65 63 55  AST 15 - 41 U/L _2 ALT 0 - 44 U/L _3 All questions were answered to patient's stated satisfaction. Encouraged patient to call with any new concerns or questions before his next visit to the cancer center and we can certain see him sooner, if needed.     ASSESSMENT & PLAN:  DLBCL (diffuse large B cell lymphoma) ([place]) 1.  Autoimmune hemolytic anemia: -Prednisone 120 mg daily started on 08/05/2019, rituximab 4 doses from 08/15/2019 through 09/05/2019. -He was last seen on 12/22/2019 and the plan was to cut prednisone to 5 mg daily for 2 weeks followed by 5 mg every other day for the next 2 weeks. -We gave 2 IV iron infusions on 01/05/2020 and 01/12/2020 due to his ferritin being 92% saturation 18. -He has been off of his prednisone for a little over 2 weeks now. -Labs done on 04/11/2020 showed hemoglobin 11.6, WBC 2.8, MCV 96.5, platelets 220, reticulocytes 0.4. -We will  check Coombs test today -We will see him back in 1 week and check nutritional deficiency labs  2.  Leg swelling: -He will use Lasix 20 mg daily as needed.  3.  Poorly controlled diabetes: -Glucose today is 110.  He is taking Amaryl 2 mg twice daily. -Patient reports it is hard to control his diabetes with the prednisone. -Hemoglobin A1c 8.2  4.  DLBCL: -Diagnosed in 2006, treated with R-CHOP with complete response on PET scan. -Relapse of lymphoma on 08/31/2005, treated with salvage chemotherapy followed by stem cell transplant on 01/15/2006. -Rituximab weekly x4 followed by every 6 months for 2 years completed on 12/05/2007. -PET scan on 08/14/2019 did not show any evidence or recurrence of lymphoma. -No B  symptoms at this time.     Orders placed this encounter:  Orders Placed This Encounter  Procedures   CBC with Differential/Platelet   Comprehensive metabolic panel   Ferritin   Iron and TIBC   Lactate dehydrogenase   Reticulocytes   Folate   Vitamin B12   VITAMIN D 25 Hydroxy (Vit-D Deficiency, Fractures)   Direct antiglobulin test (not at [facility])   Direct antiglobulin test (not at [facility])      Francene Finders, FNP-C [place] [phone]

## 2020-04-16 ENCOUNTER — Other Ambulatory Visit: Payer: Self-pay

## 2020-04-16 ENCOUNTER — Inpatient Hospital Stay (HOSPITAL_COMMUNITY): Payer: Medicare Other

## 2020-04-16 DIAGNOSIS — E119 Type 2 diabetes mellitus without complications: Secondary | ICD-10-CM | POA: Diagnosis not present

## 2020-04-16 DIAGNOSIS — M7989 Other specified soft tissue disorders: Secondary | ICD-10-CM | POA: Diagnosis not present

## 2020-04-16 DIAGNOSIS — Z8249 Family history of ischemic heart disease and other diseases of the circulatory system: Secondary | ICD-10-CM | POA: Diagnosis not present

## 2020-04-16 DIAGNOSIS — Z8572 Personal history of non-Hodgkin lymphomas: Secondary | ICD-10-CM | POA: Diagnosis not present

## 2020-04-16 DIAGNOSIS — Z87891 Personal history of nicotine dependence: Secondary | ICD-10-CM | POA: Diagnosis not present

## 2020-04-16 DIAGNOSIS — D591 Autoimmune hemolytic anemia, unspecified: Secondary | ICD-10-CM | POA: Diagnosis not present

## 2020-04-16 DIAGNOSIS — Z8349 Family history of other endocrine, nutritional and metabolic diseases: Secondary | ICD-10-CM | POA: Diagnosis not present

## 2020-04-16 DIAGNOSIS — D5919 Other autoimmune hemolytic anemia: Secondary | ICD-10-CM

## 2020-04-16 DIAGNOSIS — R05 Cough: Secondary | ICD-10-CM | POA: Diagnosis not present

## 2020-04-16 DIAGNOSIS — Z79899 Other long term (current) drug therapy: Secondary | ICD-10-CM | POA: Diagnosis not present

## 2020-04-16 DIAGNOSIS — I10 Essential (primary) hypertension: Secondary | ICD-10-CM | POA: Diagnosis not present

## 2020-04-16 DIAGNOSIS — Z801 Family history of malignant neoplasm of trachea, bronchus and lung: Secondary | ICD-10-CM | POA: Diagnosis not present

## 2020-04-16 LAB — COMPREHENSIVE METABOLIC PANEL
ALT: 18 U/L (ref 0–44)
AST: 19 U/L (ref 15–41)
Albumin: 3.5 g/dL (ref 3.5–5.0)
Alkaline Phosphatase: 66 U/L (ref 38–126)
Anion gap: 9 (ref 5–15)
BUN: 8 mg/dL (ref 8–23)
CO2: 26 mmol/L (ref 22–32)
Calcium: 8.5 mg/dL — ABNORMAL LOW (ref 8.9–10.3)
Chloride: 106 mmol/L (ref 98–111)
Creatinine, Ser: 1.04 mg/dL (ref 0.61–1.24)
GFR calc Af Amer: >60 mL/min (ref >60)
GFR calc non Af Amer: >60 mL/min (ref >60)
Glucose, Bld: 129 mg/dL — ABNORMAL HIGH (ref 70–99)
Potassium: 3.1 mmol/L — ABNORMAL LOW (ref 3.5–5.1)
Sodium: 141 mmol/L (ref 135–145)
Total Bilirubin: 0.5 mg/dL (ref 0.3–1.2)
Total Protein: 6 g/dL — ABNORMAL LOW (ref 6.5–8.1)

## 2020-04-16 LAB — IRON AND TIBC
Iron: 67 ug/dL (ref 45–182)
Saturation Ratios: 21 % (ref 17.9–39.5)
TIBC: 316 ug/dL (ref 250–450)
UIBC: 249 ug/dL

## 2020-04-16 LAB — CBC WITH DIFFERENTIAL/PLATELET
Abs Immature Granulocytes: 0.02 10*3/uL (ref 0.00–0.07)
Basophils Absolute: 0.1 10*3/uL (ref 0.0–0.1)
Basophils Relative: 1 %
Eosinophils Absolute: 0.2 10*3/uL (ref 0.0–0.5)
Eosinophils Relative: 5 %
HCT: 33.3 % — ABNORMAL LOW (ref 39.0–52.0)
Hemoglobin: 10.9 g/dL — ABNORMAL LOW (ref 13.0–17.0)
Immature Granulocytes: 1 %
Lymphocytes Relative: 36 %
Lymphs Abs: 1.4 10*3/uL (ref 0.7–4.0)
MCH: 31.6 pg (ref 26.0–34.0)
MCHC: 32.7 g/dL (ref 30.0–36.0)
MCV: 96.5 fL (ref 80.0–100.0)
Monocytes Absolute: 0.6 10*3/uL (ref 0.1–1.0)
Monocytes Relative: 16 %
Neutro Abs: 1.6 10*3/uL — ABNORMAL LOW (ref 1.7–7.7)
Neutrophils Relative %: 41 %
Platelets: 204 10*3/uL (ref 150–400)
RBC: 3.45 MIL/uL — ABNORMAL LOW (ref 4.22–5.81)
RDW: 12.9 % (ref 11.5–15.5)
WBC: 3.9 10*3/uL — ABNORMAL LOW (ref 4.0–10.5)
nRBC: 0.5 % — ABNORMAL HIGH (ref 0.0–0.2)

## 2020-04-16 LAB — FERRITIN: Ferritin: 371 ng/mL — ABNORMAL HIGH (ref 24–336)

## 2020-04-16 LAB — RETICULOCYTES
Immature Retic Fract: 25.6 % — ABNORMAL HIGH (ref 2.3–15.9)
RBC.: 3.47 MIL/uL — ABNORMAL LOW (ref 4.22–5.81)
Retic Ct Pct: <0.4 % — ABNORMAL LOW (ref 0.4–3.1)

## 2020-04-16 LAB — VITAMIN D 25 HYDROXY (VIT D DEFICIENCY, FRACTURES): Vit D, 25-Hydroxy: 72.08 ng/mL (ref 30–100)

## 2020-04-16 LAB — LACTATE DEHYDROGENASE: LDH: 216 U/L — ABNORMAL HIGH (ref 98–192)

## 2020-04-16 LAB — FOLATE: Folate: 37.6 ng/mL (ref >5.9)

## 2020-04-16 LAB — VITAMIN B12: Vitamin B-12: 579 pg/mL (ref 180–914)

## 2020-04-17 ENCOUNTER — Inpatient Hospital Stay (HOSPITAL_COMMUNITY): Payer: Medicare Other | Attending: Nurse Practitioner | Admitting: Nurse Practitioner

## 2020-04-17 ENCOUNTER — Encounter (HOSPITAL_COMMUNITY): Payer: Self-pay | Admitting: Nurse Practitioner

## 2020-04-17 ENCOUNTER — Other Ambulatory Visit: Payer: Self-pay

## 2020-04-17 VITALS — BP 138/74 | HR 78 | Temp 97.3°F | Resp 20 | Wt 277.7 lb

## 2020-04-17 DIAGNOSIS — E1165 Type 2 diabetes mellitus with hyperglycemia: Secondary | ICD-10-CM | POA: Diagnosis not present

## 2020-04-17 DIAGNOSIS — Z9221 Personal history of antineoplastic chemotherapy: Secondary | ICD-10-CM | POA: Insufficient documentation

## 2020-04-17 DIAGNOSIS — Z79899 Other long term (current) drug therapy: Secondary | ICD-10-CM | POA: Diagnosis not present

## 2020-04-17 DIAGNOSIS — Z8042 Family history of malignant neoplasm of prostate: Secondary | ICD-10-CM | POA: Insufficient documentation

## 2020-04-17 DIAGNOSIS — Z7901 Long term (current) use of anticoagulants: Secondary | ICD-10-CM | POA: Diagnosis not present

## 2020-04-17 DIAGNOSIS — Z801 Family history of malignant neoplasm of trachea, bronchus and lung: Secondary | ICD-10-CM | POA: Diagnosis not present

## 2020-04-17 DIAGNOSIS — Z9484 Stem cells transplant status: Secondary | ICD-10-CM | POA: Insufficient documentation

## 2020-04-17 DIAGNOSIS — D5919 Other autoimmune hemolytic anemia: Secondary | ICD-10-CM

## 2020-04-17 DIAGNOSIS — I10 Essential (primary) hypertension: Secondary | ICD-10-CM | POA: Insufficient documentation

## 2020-04-17 DIAGNOSIS — C8330 Diffuse large B-cell lymphoma, unspecified site: Secondary | ICD-10-CM

## 2020-04-17 DIAGNOSIS — Z794 Long term (current) use of insulin: Secondary | ICD-10-CM | POA: Diagnosis not present

## 2020-04-17 DIAGNOSIS — E559 Vitamin D deficiency, unspecified: Secondary | ICD-10-CM

## 2020-04-17 DIAGNOSIS — Z87891 Personal history of nicotine dependence: Secondary | ICD-10-CM | POA: Insufficient documentation

## 2020-04-17 DIAGNOSIS — M7989 Other specified soft tissue disorders: Secondary | ICD-10-CM | POA: Insufficient documentation

## 2020-04-17 DIAGNOSIS — D591 Autoimmune hemolytic anemia, unspecified: Secondary | ICD-10-CM | POA: Diagnosis not present

## 2020-04-17 DIAGNOSIS — Z86010 Personal history of colonic polyps: Secondary | ICD-10-CM | POA: Diagnosis not present

## 2020-04-17 DIAGNOSIS — Z8572 Personal history of non-Hodgkin lymphomas: Secondary | ICD-10-CM | POA: Diagnosis not present

## 2020-04-17 MED ORDER — ERGOCALCIFEROL 1.25 MG (50000 UT) PO CAPS: 50000.0000 [IU] | ORAL_CAPSULE | ORAL | 3 refills | Status: DC

## 2020-04-17 NOTE — Assessment & Plan Note (Signed)
1.  Autoimmune hemolytic anemia: -Prednisone 120 mg daily started on 08/05/2019, rituximab 4 doses from 08/15/2019 through 09/05/2019. -He was last seen on 12/22/2019 and the plan was to cut prednisone to 5 mg daily for 2 weeks followed by 5 mg every other day for the next 2 weeks. -We gave 2 IV iron infusions on 01/05/2020 and 01/12/2020 due to his ferritin being 92% saturation 18. -He has been off of his prednisone for a little over 4 weeks now. -Labs done on 04/16/2020 showed hemoglobin 10.9, WBC 3.9, MCV 96.5, platelets 204, reticulocytes 0.4, B12 579, folate 37.6, ferritin 371, percent saturation 21.  Coombs positive. -We will give him 2 infusions of IV iron. -We will see him back in 2 week.  2.  Leg swelling: -He will use Lasix 20 mg daily as needed.  3.  Poorly controlled diabetes: -Glucose today is 110.  He is taking Amaryl 2 mg twice daily. -Patient reports it is hard to control his diabetes with the prednisone. -Hemoglobin A1c 8.2  4.  DLBCL: -Diagnosed in 2006, treated with R-CHOP with complete response on PET scan. -Relapse of lymphoma on 08/31/2005, treated with salvage chemotherapy followed by stem cell transplant on 01/15/2006. -Rituximab weekly x4 followed by every 6 months for 2 years completed on 12/05/2007. -PET scan on 08/14/2019 did not show any evidence or recurrence of lymphoma. -No B symptoms at this time.

## 2020-04-17 NOTE — Progress Notes (Signed)
[place],  [address]   CLINIC:  Medical Oncology/Hematology  PCP:  [facility], [place] [address] [phone]   REASON FOR VISIT: Follow-up for autoimmune hemolytic anemia   CURRENT THERAPY: Surveillance  BRIEF ONCOLOGIC HISTORY:  Oncology History Overview Note  S/P chemotherapy following stabilization of the spine.  Initial biopsy was on 07/13/2005 on his bone marrow.  Thoracic vertebra was biopsied on 02/04/2005 and mediastinum on 02/03/2005.  S/P R-CHOP and achieved an incomplete response, then underwent ICE chemotherapy pre-transplant and total body radiation with autologous stem cell transplant on 01/15/2006 and maintained on Rituxan for 2 years.  Thus far without recurrence.   DLBCL (diffuse large B cell lymphoma) ([place])  01/27/2005 Imaging   PET- diffuse activity  involving the L cervical area, extensive activity in the mediastinumn particularly in the R paratracheal area and to a lesser degree in the anterior mediastinum.  Lg area in porta caval nodes in upper abd and diffuse bone activity   02/03/2005 Pathology Results   Mediastinal lymph node demonstrating diffuse large cell lymphoma, B-type.  Vertebral bone biopsy demonstrating diffuse large b-cell lymphoma   03/11/2005 - 08/04/2005 Chemotherapy   R-CHOP x 8 cycles   05/04/2005 Imaging   PET- near resolution of previously identified uptake.   07/13/2005 Bone Marrow Biopsy   Bone marrow aspiration and biopsy is negative for lymphoma   08/31/2005 Imaging   PET- further response to therapy with findings consistent with residual disease.  Hypermetabolic activity identified within the anterior mediastinal lymph node (smaller but still pathologic).  Borderline hypermetabolic small right pleural effusion   10/20/2005 - 11/13/2005 Chemotherapy   [facility] 2 [facility]   01/08/2006 - 01/09/2006 Chemotherapy   Cyclophosphamide 600 mg daily x 2     01/10/2006 - 01/14/2006 Radiation Therapy   Total body radiation twice daily   01/15/2006 Bone Marrow Transplant   Stem cell transplant at [facility]   04/05/2006 - 12/05/2007 Chemotherapy   Rituxan weekly x 4 every 6 months x 2 years     CANCER STAGING: Cancer Staging DLBCL (diffuse large B cell lymphoma) (HCC) Staging form: Lymphoid Neoplasms, AJCC 6th Edition - Clinical: Stage IV - Signed by Baird Cancer, PA on 04/29/2011    INTERVAL HISTORY:  [name] 69 y.o. male returns for routine follow-up for autoimmune hemolytic anemia.  Patient reports he is doing well since his last visit.  He denies any bleeding issues. Denies any nausea, vomiting, or diarrhea. Denies any new pains. Had not noticed any recent bleeding such as epistaxis, hematuria or hematochezia. Denies recent chest pain on exertion, shortness of breath on minimal exertion, pre-syncopal episodes, or palpitations. Denies any numbness or tingling in hands or feet. Denies any recent fevers, infections, or recent hospitalizations. Patient reports appetite at 75% and energy level at 50%.     REVIEW OF SYSTEMS:  Review of Systems  Respiratory: Positive for shortness of breath (With exertion).   All other systems reviewed and are negative.    PAST MEDICAL/SURGICAL HISTORY:  Past Medical History:  Diagnosis Date  . Asthma    as child  . Diabetes mellitus without complication ([place])   . DLBCL (diffuse large B cell lymphoma) ([place]) 02/28/2009  . Edema   . Heart failure, diastolic, chronic ([place])    patient denies  . Hyperlipidemia, mixed   . Hypertension   . IDA (iron deficiency anemia)   . Large cell lymphoma ([place]) 01/2005  autologous stem cell transplant 12/2005  . Obesity, morbid (more than 100 lbs over ideal weight or BMI > 40) (HCC)   . Venous insufficiency 04/29/2011   Past Surgical History:  Procedure Laterality Date  . BACK SURGERY  2006   T6 vertebrae removed/titanium placed  . CATARACT EXTRACTION W/PHACO Right  03/31/2019   Procedure: CATARACT EXTRACTION PHACO AND INTRAOCULAR LENS PLACEMENT (IOC);  Surgeon: Baruch Goldmann, MD;  Location: [facility];  Service: Ophthalmology;  Laterality: Right;  CDE: 3.21  . CATARACT EXTRACTION W/PHACO Left 04/14/2019   Procedure: CATARACT EXTRACTION PHACO AND INTRAOCULAR LENS PLACEMENT (IOC);  Surgeon: Baruch Goldmann, MD;  Location: [facility];  Service: Ophthalmology;  Laterality: Left;  left - pt knows to arrive at 7:45, CDE: 3.55  . COLONOSCOPY  11/24/2004   Polyps in the left colon ablated/removed as described above.  Two submucosal lesions consistent with lipomas as described above not  manipulated./ Normal rectum  . COLONOSCOPY  06/20/2012   MULTIPLE RECTAL AND COLONIC POLYPS  . COLONOSCOPY N/A 03/08/2015   RMR: Capacious, redundant colon. Multiple colonic and rectosigmoid polyps removed. ablated as described above. colonic lipoma abnormal appearing terminal ileum likely a variant of normal). Howeverwith history  biopsies obtained.   . ESOPHAGOGASTRODUODENOSCOPY N/A 03/08/2015   RMR: Hiatal hernia Polypoid gastric mucosa with multiple gastric polyps. largest polyp removed via snare polypectomgy hemostasis clip placed at base. Status post gastric biopsy. Status post video capsule placement.   Marland Kitchen GIVENS CAPSULE STUDY N/A 03/08/2015   Procedure: GIVENS CAPSULE STUDY;  Surgeon: Daneil Dolin, MD;  Location: [facility];  Service: Endoscopy;  Laterality: N/A;  . LIMBAL STEM CELL TRANSPLANT    . LUNG BIOPSY  6/06  . MULTIPLE TOOTH EXTRACTIONS  04/2005  . port a cath placement    . PORT-A-CATH REMOVAL  09/08/2012   Procedure: REMOVAL PORT-A-CATH;  Surgeon: Melrose Nakayama, MD;  Location: [facility];  Service: Thoracic;  Laterality: N/A;     SOCIAL HISTORY:  Social History   Socioeconomic History  . Marital status: Married    Spouse name: Not on file  . Number of children: 2  . Years of education: Not on file  . Highest education level: Not on file  Occupational History  .  Occupation: disability due to back    Employer: UNEMPLOYED  Tobacco Use  . Smoking status: Former Smoker    Packs/day: 0.50    Years: 15.00    Pack years: 7.50    Quit date: 11/11/2002    Years since quitting: 17.4  . Smokeless tobacco: Never Used  Vaping Use  . Vaping Use: Never used  Substance and Sexual Activity  . Alcohol use: No    Alcohol/week: 0.0 standard drinks  . Drug use: No  . Sexual activity: Yes    Birth control/protection: None  Other Topics Concern  . Not on file  Social History Narrative   Married   No regular exercise   Social Determinants of Health   Financial Resource Strain:   . Difficulty of Paying Living Expenses: Not on file  Food Insecurity:   . Worried About Charity fundraiser in the Last Year: Not on file  . Ran Out of Food in the Last Year: Not on file  Transportation Needs:   . Lack of Transportation (Medical): Not on file  . Lack of Transportation (Non-Medical): Not on file  Physical Activity:   . Days of Exercise per Week: Not on file  . Minutes of Exercise  per Session: Not on file  Stress:   . Feeling of Stress : Not on file  Social Connections:   . Frequency of Communication with Friends and Family: Not on file  . Frequency of Social Gatherings with Friends and Family: Not on file  . Attends Religious Services: Not on file  . Active Member of Clubs or Organizations: Not on file  . Attends Archivist Meetings: Not on file  . Marital Status: Not on file  Intimate Partner Violence:   . Fear of Current or Ex-Partner: Not on file  . Emotionally Abused: Not on file  . Physically Abused: Not on file  . Sexually Abused: Not on file    FAMILY HISTORY:  Family History  Problem Relation Age of Onset  . Cancer Mother        lung  . Hypertension Mother   . Hyperlipidemia Mother   . Cancer Father        prostate  . Hypertension Father   . Colon cancer Neg Hx   . Diabetes Neg Hx     CURRENT MEDICATIONS:  Outpatient  Encounter Medications as of 04/17/2020  Medication Sig  . apixaban (ELIQUIS) 5 MG TABS tablet Take 1 tablet (5 mg total) by mouth 2 (two) times daily.  . Blood Glucose Monitoring Suppl (ONE TOUCH ULTRA 2) w/Device KIT USE TO TEST BLOOD SUGAR ONCE D UTD  . clotrimazole-betamethasone (LOTRISONE) cream Apply 1 application topically 2 (two) times daily as needed (leg infection).   Marland Kitchen ergocalciferol (VITAMIN D2) 1.25 MG (50000 UT) capsule Take 1 capsule (50,000 Units total) by mouth once a week.  . folic acid (FOLVITE) 1 MG tablet TAKE 1 TABLET(1 MG) BY MOUTH DAILY  . furosemide (LASIX) 20 MG tablet Take 20 mg by mouth daily as needed for fluid or edema.   . insulin degludec (TRESIBA FLEXTOUCH) 100 UNIT/ML FlexTouch Pen Inject 0.35 mLs (35 Units total) into the skin at bedtime.  . Insulin Pen Needle (B-D ULTRAFINE III SHORT PEN) 31G X 8 MM MISC 1 each by Does not apply route daily. Use once a day as directed  . Lancets (ONETOUCH DELICA PLUS [id number]) MISC USE TO TEST BLOOD SUGAR ONCE D  . metFORMIN (GLUCOPHAGE) 500 MG tablet TAKE 1 TABLET(500 MG) BY MOUTH TWICE DAILY WITH A MEAL  . metoprolol tartrate (LOPRESSOR) 25 MG tablet Take 1 tablet (25 mg total) by mouth 2 (two) times daily.  Glory Rosebush ULTRA test strip USE TO TEST BLOOD SUGAR 2 TIMES A DAY  . rosuvastatin (CRESTOR) 40 MG tablet Take 40 mg by mouth daily.  . vitamin B-12 (CYANOCOBALAMIN) 1000 MCG tablet Take 2,000 mcg by mouth daily.  . [DISCONTINUED] ergocalciferol (VITAMIN D2) 1.25 MG (50000 UT) capsule Take 1 capsule (50,000 Units total) by mouth once a week.   No facility-administered encounter medications on file as of 04/17/2020.    ALLERGIES:  Allergies  Allergen Reactions  . Penicillins Rash    Has patient had a PCN reaction causing immediate rash, facial/tongue/throat swelling, SOB or lightheadedness with hypotension: Unknown Has patient had a PCN reaction causing severe rash involving mucus membranes or skin necrosis:  Unknown Has patient had a PCN reaction that required hospitalization: Unknown Has patient had a PCN reaction occurring within the last 10 years: No If all of the above answers are "NO", then may proceed with Cephalosporin use.      PHYSICAL EXAM:  ECOG Performance status: 1  Vitals:   04/17/20 1317  BP: 138/74  Pulse: 78  Resp: 20  Temp: (!) 97.3 F (36.3 C)  SpO2: 99%   Filed Weights   04/17/20 1317  Weight: 277 lb 11.2 oz (126 kg)   Physical Exam Constitutional:      Appearance: Normal appearance. He is normal weight.  Cardiovascular:     Rate and Rhythm: Normal rate and regular rhythm.     Heart sounds: Normal heart sounds.  Pulmonary:     Effort: Pulmonary effort is normal.     Breath sounds: Normal breath sounds.  Abdominal:     General: Bowel sounds are normal.  Musculoskeletal:        General: Normal range of motion.  Skin:    General: Skin is warm.  Neurological:     Mental Status: He is alert and oriented to person, place, and time. Mental status is at baseline.  Psychiatric:        Mood and Affect: Mood normal.        Behavior: Behavior normal.        Thought Content: Thought content normal.        Judgment: Judgment normal.      LABORATORY DATA:  I have reviewed the labs as listed.  CBC    Component Value Date/Time   WBC 3.9 (L) 04/16/2020 1022   RBC 3.45 (L) 04/16/2020 1022   RBC 3.47 (L) 04/16/2020 1022   HGB 10.9 (L) 04/16/2020 1022   HCT 33.3 (L) 04/16/2020 1022   PLT 204 04/16/2020 1022   MCV 96.5 04/16/2020 1022   MCH 31.6 04/16/2020 1022   MCHC 32.7 04/16/2020 1022   RDW 12.9 04/16/2020 1022   LYMPHSABS 1.4 04/16/2020 1022   MONOABS 0.6 04/16/2020 1022   EOSABS 0.2 04/16/2020 1022   BASOSABS 0.1 04/16/2020 1022   CMP Latest Ref Rng & Units 04/16/2020 04/11/2020 03/05/2020  Glucose 70 - 99 mg/dL 129(H) 110(H) 107(H)  BUN 8 - 23 mg/dL 8 11 11   Creatinine 0.61 - 1.24 mg/dL 1.04 1.00 0.86  Sodium 135 - 145 mmol/L 141 143 141   Potassium 3.5 - 5.1 mmol/L 3.1(L) 3.4(L) 3.9  Chloride 98 - 111 mmol/L 106 106 107  CO2 22 - 32 mmol/L 26 26 25   Calcium 8.9 - 10.3 mg/dL 8.5(L) 9.0 9.0  Total Protein 6.5 - 8.1 g/dL 6.0(L) 6.1(L) 6.3(L)  Total Bilirubin 0.3 - 1.2 mg/dL 0.5 0.8 0.5  Alkaline Phos 38 - 126 U/L 66 65 63  AST 15 - 41 U/L 19 19 17   ALT 0 - 44 U/L 18 20 17    All questions were answered to patient's stated satisfaction. Encouraged patient to call with any new concerns or questions before his next visit to the cancer center and we can certain see him sooner, if needed.     ASSESSMENT & PLAN:  DLBCL (diffuse large B cell lymphoma) ([place]) 1.  Autoimmune hemolytic anemia: -Prednisone 120 mg daily started on 08/05/2019, rituximab 4 doses from 08/15/2019 through 09/05/2019. -He was last seen on 12/22/2019 and the plan was to cut prednisone to 5 mg daily for 2 weeks followed by 5 mg every other day for the next 2 weeks. -We gave 2 IV iron infusions on 01/05/2020 and 01/12/2020 due to his ferritin being 92% saturation 18. -He has been off of his prednisone for a little over 4 weeks now. -Labs done on 04/16/2020 showed hemoglobin 10.9, WBC 3.9, MCV 96.5, platelets 204, reticulocytes 0.4, B12 579, folate 37.6, ferritin 371, percent saturation 21.  Coombs positive. -  We will give him 2 infusions of IV iron. -We will see him back in 2 week.  2.  Leg swelling: -He will use Lasix 20 mg daily as needed.  3.  Poorly controlled diabetes: -Glucose today is 110.  He is taking Amaryl 2 mg twice daily. -Patient reports it is hard to control his diabetes with the prednisone. -Hemoglobin A1c 8.2  4.  DLBCL: -Diagnosed in 2006, treated with R-CHOP with complete response on PET scan. -Relapse of lymphoma on 08/31/2005, treated with salvage chemotherapy followed by stem cell transplant on 01/15/2006. -Rituximab weekly x4 followed by every 6 months for 2 years completed on 12/05/2007. -PET scan on 08/14/2019 did not show any evidence or  recurrence of lymphoma. -No B symptoms at this time.     Orders placed this encounter:  Orders Placed This Encounter  Procedures  . CBC with Differential/Platelet  . Comprehensive metabolic panel  . Ferritin  . Iron and TIBC  . Lactate dehydrogenase  . Reticulocytes  . Direct antiglobulin test (not at [facility])      Francene Finders, [place] [phone]

## 2020-04-19 ENCOUNTER — Encounter (HOSPITAL_COMMUNITY): Payer: Self-pay

## 2020-04-19 ENCOUNTER — Other Ambulatory Visit: Payer: Self-pay

## 2020-04-19 ENCOUNTER — Inpatient Hospital Stay (HOSPITAL_COMMUNITY): Payer: Medicare Other

## 2020-04-19 VITALS — BP 131/70 | HR 74 | Temp 97.2°F | Resp 18

## 2020-04-19 DIAGNOSIS — Z801 Family history of malignant neoplasm of trachea, bronchus and lung: Secondary | ICD-10-CM | POA: Diagnosis not present

## 2020-04-19 DIAGNOSIS — D6489 Other specified anemias: Secondary | ICD-10-CM

## 2020-04-19 DIAGNOSIS — M7989 Other specified soft tissue disorders: Secondary | ICD-10-CM | POA: Diagnosis not present

## 2020-04-19 DIAGNOSIS — Z8249 Family history of ischemic heart disease and other diseases of the circulatory system: Secondary | ICD-10-CM | POA: Diagnosis not present

## 2020-04-19 DIAGNOSIS — Z87891 Personal history of nicotine dependence: Secondary | ICD-10-CM | POA: Diagnosis not present

## 2020-04-19 DIAGNOSIS — Z79899 Other long term (current) drug therapy: Secondary | ICD-10-CM | POA: Diagnosis not present

## 2020-04-19 DIAGNOSIS — Z8349 Family history of other endocrine, nutritional and metabolic diseases: Secondary | ICD-10-CM | POA: Diagnosis not present

## 2020-04-19 DIAGNOSIS — Z8572 Personal history of non-Hodgkin lymphomas: Secondary | ICD-10-CM | POA: Diagnosis not present

## 2020-04-19 DIAGNOSIS — E119 Type 2 diabetes mellitus without complications: Secondary | ICD-10-CM | POA: Diagnosis not present

## 2020-04-19 DIAGNOSIS — I10 Essential (primary) hypertension: Secondary | ICD-10-CM | POA: Diagnosis not present

## 2020-04-19 DIAGNOSIS — R05 Cough: Secondary | ICD-10-CM | POA: Diagnosis not present

## 2020-04-19 DIAGNOSIS — D591 Autoimmune hemolytic anemia, unspecified: Secondary | ICD-10-CM | POA: Diagnosis not present

## 2020-04-19 MED ORDER — SODIUM CHLORIDE 0.9 % IV SOLN: Freq: Once | INTRAVENOUS | Status: AC

## 2020-04-19 MED ORDER — SODIUM CHLORIDE 0.9 % IV SOLN
510.0000 mg | Freq: Once | INTRAVENOUS | Status: AC
  Administered 2020-04-19: 510 mg via INTRAVENOUS
  Filled 2020-04-19: qty 510

## 2020-04-19 NOTE — Progress Notes (Signed)
Patient presents today for Feraheme infusion. Vital signs are stable. Patient has no complaints of any changes since his last visit. MAR reviewed and updated.   Feraheme given today per MD orders. Tolerated infusion without adverse affects. Vital signs stable. No complaints at this time. Discharged from clinic ambulatory. F/U with [facility] as scheduled.

## 2020-04-19 NOTE — Patient Instructions (Signed)
[facility] at [facility]  Discharge Instructions:   _______________________________________________________________  Thank you for choosing [facility] at [facility] to provide your oncology and hematology care.  To afford each patient quality time with our providers, please arrive at least 15 minutes before your scheduled appointment.  You need to re-schedule your appointment if you arrive 10 or more minutes late.  We strive to give you quality time with our providers, and arriving late affects you and other patients whose appointments are after yours.  Also, if you no show three or more times for appointments you may be dismissed from the clinic.  Again, thank you for choosing [facility] at [facility]. Our hope is that these requests will allow you access to exceptional care and in a timely manner. _______________________________________________________________  If you have questions after your visit, please contact our office at [phone] between the hours of 8:30 a.m. and 5:00 p.m. Voicemails left after 4:30 p.m. will not be returned until the following business day. _______________________________________________________________  For prescription refill requests, have your pharmacy contact our office. _______________________________________________________________  Recommendations made by the consultant and any test results will be sent to your referring physician. _______________________________________________________________ 

## 2020-04-23 DIAGNOSIS — Z87891 Personal history of nicotine dependence: Secondary | ICD-10-CM | POA: Diagnosis not present

## 2020-04-23 DIAGNOSIS — I2510 Atherosclerotic heart disease of native coronary artery without angina pectoris: Secondary | ICD-10-CM | POA: Diagnosis not present

## 2020-04-23 DIAGNOSIS — E1129 Type 2 diabetes mellitus with other diabetic kidney complication: Secondary | ICD-10-CM | POA: Diagnosis not present

## 2020-04-23 DIAGNOSIS — I4891 Unspecified atrial fibrillation: Secondary | ICD-10-CM | POA: Diagnosis not present

## 2020-04-26 ENCOUNTER — Encounter (HOSPITAL_COMMUNITY): Payer: Self-pay

## 2020-04-26 ENCOUNTER — Inpatient Hospital Stay (HOSPITAL_COMMUNITY): Payer: Medicare Other | Attending: Hematology

## 2020-04-26 ENCOUNTER — Other Ambulatory Visit: Payer: Self-pay

## 2020-04-26 VITALS — BP 144/59 | HR 75 | Temp 97.2°F | Resp 18

## 2020-04-26 DIAGNOSIS — Z8572 Personal history of non-Hodgkin lymphomas: Secondary | ICD-10-CM | POA: Diagnosis not present

## 2020-04-26 DIAGNOSIS — E1165 Type 2 diabetes mellitus with hyperglycemia: Secondary | ICD-10-CM | POA: Insufficient documentation

## 2020-04-26 DIAGNOSIS — D591 Autoimmune hemolytic anemia, unspecified: Secondary | ICD-10-CM | POA: Diagnosis not present

## 2020-04-26 DIAGNOSIS — D6489 Other specified anemias: Secondary | ICD-10-CM

## 2020-04-26 MED ORDER — SODIUM CHLORIDE 0.9 % IV SOLN: Freq: Once | INTRAVENOUS | Status: AC

## 2020-04-26 MED ORDER — SODIUM CHLORIDE 0.9 % IV SOLN
510.0000 mg | Freq: Once | INTRAVENOUS | Status: AC
  Administered 2020-04-26: 510 mg via INTRAVENOUS
  Filled 2020-04-26: qty 510

## 2020-04-26 NOTE — Patient Instructions (Signed)
[facility] at [facility]  Discharge Instructions:   _______________________________________________________________  Thank you for choosing [facility] at [facility] to provide your oncology and hematology care.  To afford each patient quality time with our providers, please arrive at least 15 minutes before your scheduled appointment.  You need to re-schedule your appointment if you arrive 10 or more minutes late.  We strive to give you quality time with our providers, and arriving late affects you and other patients whose appointments are after yours.  Also, if you no show three or more times for appointments you may be dismissed from the clinic.  Again, thank you for choosing [facility] at [facility]. Our hope is that these requests will allow you access to exceptional care and in a timely manner. _______________________________________________________________  If you have questions after your visit, please contact our office at [phone] between the hours of 8:30 a.m. and 5:00 p.m. Voicemails left after 4:30 p.m. will not be returned until the following business day. _______________________________________________________________  For prescription refill requests, have your pharmacy contact our office. _______________________________________________________________  Recommendations made by the consultant and any test results will be sent to your referring physician. _______________________________________________________________ 

## 2020-04-26 NOTE — Progress Notes (Signed)
Patient presents today for Feraheme infusion. Vital signs are stable. Patient denies pain today. Patient denies any significant changes since his last visit. MAR reviewed and updated.   Treatment given today per MD orders. Tolerated infusion without adverse affects. Vital signs stable. No complaints at this time. Discharged from clinic ambulatory. F/U with [facility] as scheduled.

## 2020-05-01 ENCOUNTER — Ambulatory Visit: Payer: Medicare Other | Admitting: Nurse Practitioner

## 2020-05-01 ENCOUNTER — Other Ambulatory Visit: Payer: Self-pay

## 2020-05-01 ENCOUNTER — Encounter: Payer: Medicare Other | Attending: "Endocrinology | Admitting: Nutrition

## 2020-05-01 ENCOUNTER — Encounter: Payer: Self-pay | Admitting: Nurse Practitioner

## 2020-05-01 ENCOUNTER — Encounter: Payer: Self-pay | Admitting: Nutrition

## 2020-05-01 ENCOUNTER — Inpatient Hospital Stay (HOSPITAL_COMMUNITY): Payer: Medicare Other

## 2020-05-01 VITALS — BP 152/81 | HR 79 | Ht 71.0 in | Wt 272.0 lb

## 2020-05-01 DIAGNOSIS — E559 Vitamin D deficiency, unspecified: Secondary | ICD-10-CM

## 2020-05-01 DIAGNOSIS — I10 Essential (primary) hypertension: Secondary | ICD-10-CM

## 2020-05-01 DIAGNOSIS — D591 Autoimmune hemolytic anemia, unspecified: Secondary | ICD-10-CM | POA: Diagnosis not present

## 2020-05-01 DIAGNOSIS — D5919 Other autoimmune hemolytic anemia: Secondary | ICD-10-CM

## 2020-05-01 DIAGNOSIS — E782 Mixed hyperlipidemia: Secondary | ICD-10-CM | POA: Diagnosis not present

## 2020-05-01 DIAGNOSIS — E1165 Type 2 diabetes mellitus with hyperglycemia: Secondary | ICD-10-CM | POA: Insufficient documentation

## 2020-05-01 DIAGNOSIS — Z8572 Personal history of non-Hodgkin lymphomas: Secondary | ICD-10-CM | POA: Diagnosis not present

## 2020-05-01 LAB — CBC WITH DIFFERENTIAL/PLATELET
Abs Immature Granulocytes: 0.01 10*3/uL (ref 0.00–0.07)
Basophils Absolute: 0 10*3/uL (ref 0.0–0.1)
Basophils Relative: 1 %
Eosinophils Absolute: 0.4 10*3/uL (ref 0.0–0.5)
Eosinophils Relative: 9 %
HCT: 38 % — ABNORMAL LOW (ref 39.0–52.0)
Hemoglobin: 12.2 g/dL — ABNORMAL LOW (ref 13.0–17.0)
Immature Granulocytes: 0 %
Lymphocytes Relative: 16 %
Lymphs Abs: 0.6 10*3/uL — ABNORMAL LOW (ref 0.7–4.0)
MCH: 31.9 pg (ref 26.0–34.0)
MCHC: 32.1 g/dL (ref 30.0–36.0)
MCV: 99.5 fL (ref 80.0–100.0)
Monocytes Absolute: 0.7 10*3/uL (ref 0.1–1.0)
Monocytes Relative: 18 %
Neutro Abs: 2.3 10*3/uL (ref 1.7–7.7)
Neutrophils Relative %: 56 %
Platelets: 186 10*3/uL (ref 150–400)
RBC: 3.82 MIL/uL — ABNORMAL LOW (ref 4.22–5.81)
RDW: 15.9 % — ABNORMAL HIGH (ref 11.5–15.5)
WBC: 4.1 10*3/uL (ref 4.0–10.5)
nRBC: 0 % (ref 0.0–0.2)

## 2020-05-01 LAB — COMPREHENSIVE METABOLIC PANEL
ALT: 12 U/L (ref 0–44)
AST: 17 U/L (ref 15–41)
Albumin: 3.6 g/dL (ref 3.5–5.0)
Alkaline Phosphatase: 67 U/L (ref 38–126)
Anion gap: 10 (ref 5–15)
BUN: 11 mg/dL (ref 8–23)
CO2: 27 mmol/L (ref 22–32)
Calcium: 9.1 mg/dL (ref 8.9–10.3)
Chloride: 105 mmol/L (ref 98–111)
Creatinine, Ser: 1.11 mg/dL (ref 0.61–1.24)
GFR calc Af Amer: >60 mL/min (ref >60)
GFR calc non Af Amer: >60 mL/min (ref >60)
Glucose, Bld: 100 mg/dL — ABNORMAL HIGH (ref 70–99)
Potassium: 3.6 mmol/L (ref 3.5–5.1)
Sodium: 142 mmol/L (ref 135–145)
Total Bilirubin: 0.8 mg/dL (ref 0.3–1.2)
Total Protein: 6.5 g/dL (ref 6.5–8.1)

## 2020-05-01 LAB — IRON AND TIBC
Iron: 56 ug/dL (ref 45–182)
Saturation Ratios: 17 % — ABNORMAL LOW (ref 17.9–39.5)
TIBC: 325 ug/dL (ref 250–450)
UIBC: 269 ug/dL

## 2020-05-01 LAB — RETICULOCYTES
Immature Retic Fract: 26.5 % — ABNORMAL HIGH (ref 2.3–15.9)
RBC.: 3.79 MIL/uL — ABNORMAL LOW (ref 4.22–5.81)
Retic Count, Absolute: 134.9 10*3/uL (ref 19.0–186.0)
Retic Ct Pct: 3.6 % — ABNORMAL HIGH (ref 0.4–3.1)

## 2020-05-01 LAB — FERRITIN: Ferritin: 539 ng/mL — ABNORMAL HIGH (ref 24–336)

## 2020-05-01 LAB — LACTATE DEHYDROGENASE: LDH: 181 U/L (ref 98–192)

## 2020-05-01 MED ORDER — TRESIBA FLEXTOUCH 100 UNIT/ML ~~LOC~~ SOPN: 35.0000 [IU] | PEN_INJECTOR | Freq: Every day | SUBCUTANEOUS | 3 refills | Status: DC

## 2020-05-01 MED ORDER — ONETOUCH DELICA PLUS LANCET33G MISC: 3 refills | Status: DC

## 2020-05-01 MED ORDER — BD PEN NEEDLE SHORT U/F 31G X 8 MM MISC: 1.0000 | Freq: Every day | 3 refills | Status: DC

## 2020-05-01 MED ORDER — ONETOUCH ULTRA VI STRP: ORAL_STRIP | 3 refills | Status: DC

## 2020-05-01 NOTE — Patient Instructions (Signed)

## 2020-05-01 NOTE — Patient Instructions (Addendum)
  Goals  Increase lower carb vegetables Don't skip meals. Drink a glucerna or equivalent if not able to eat a meal. Get A1C to 7% or less. Lose 2-3 lbs per month.

## 2020-05-01 NOTE — Progress Notes (Signed)
05/01/2020, 1:37 PM  Endocrinology follow-up note   Subjective:    Patient ID: [name], male    DOB: [birth year].  [name] is being seen in follow-up after he was seen in consultation for management of currently uncontrolled symptomatic diabetes requested by  Redmond School, MD.   Past Medical History:  Diagnosis Date   Asthma    as child   Diabetes mellitus without complication ([place])    DLBCL (diffuse large B cell lymphoma) ([place]) 02/28/2009   Edema    Heart failure, diastolic, chronic ([place])    patient denies   Hyperlipidemia, mixed    Hypertension    IDA (iron deficiency anemia)    Large cell lymphoma ([place]) 01/2005   autologous stem cell transplant 12/2005   Obesity, morbid (more than 100 lbs over ideal weight or BMI > 40) ([place])    Venous insufficiency 04/29/2011    Past Surgical History:  Procedure Laterality Date   BACK SURGERY  2006   T6 vertebrae removed/titanium placed   CATARACT EXTRACTION W/PHACO Right 03/31/2019   Procedure: CATARACT EXTRACTION PHACO AND INTRAOCULAR LENS PLACEMENT ([place]);  Surgeon: Baruch Goldmann, MD;  Location: [facility];  Service: Ophthalmology;  Laterality: Right;  CDE: 3.21   CATARACT EXTRACTION W/PHACO Left 04/14/2019   Procedure: CATARACT EXTRACTION PHACO AND INTRAOCULAR LENS PLACEMENT ([facility]);  Surgeon: Baruch Goldmann, MD;  Location: [facility];  Service: Ophthalmology;  Laterality: Left;  left - pt knows to arrive at 7:45, CDE: 3.55   COLONOSCOPY  11/24/2004   Polyps in the left colon ablated/removed as described above.  Two submucosal lesions consistent with lipomas as described above not  manipulated./ Normal rectum   COLONOSCOPY  06/20/2012   MULTIPLE RECTAL AND COLONIC POLYPS   COLONOSCOPY N/A 03/08/2015   RMR: Capacious, redundant colon. Multiple colonic and rectosigmoid polyps removed. ablated as described above. colonic lipoma abnormal appearing terminal  ileum likely a variant of normal). Howeverwith history  biopsies obtained.    ESOPHAGOGASTRODUODENOSCOPY N/A 03/08/2015   RMR: Hiatal hernia Polypoid gastric mucosa with multiple gastric polyps. largest polyp removed via snare polypectomgy hemostasis clip placed at base. Status post gastric biopsy. Status post video capsule placement.    GIVENS CAPSULE STUDY N/A 03/08/2015   Procedure: GIVENS CAPSULE STUDY;  Surgeon: Daneil Dolin, MD;  Location: [facility];  Service: Endoscopy;  Laterality: N/A;   LIMBAL STEM CELL TRANSPLANT     LUNG BIOPSY  6/06   MULTIPLE TOOTH EXTRACTIONS  04/2005   port a cath placement     PORT-A-CATH REMOVAL  09/08/2012   Procedure: REMOVAL PORT-A-CATH;  Surgeon: Melrose Nakayama, MD;  Location: [facility] OR;  Service: Thoracic;  Laterality: N/A;    Social History   Socioeconomic History   Marital status: Married    Spouse name: Not on file   Number of children: 2   Years of education: Not on file   Highest education level: Not on file  Occupational History   Occupation: disability due to back    Employer: UNEMPLOYED  Tobacco Use   Smoking status:  Former Smoker    Packs/day: 0.50    Years: 15.00    Pack years: 7.50    Quit date: 11/11/2002    Years since quitting: 17.4   Smokeless tobacco: Never Used  Vaping Use   Vaping Use: Never used  Substance and Sexual Activity   Alcohol use: No    Alcohol/week: 0.0 standard drinks   Drug use: No   Sexual activity: Yes    Birth control/protection: None  Other Topics Concern   Not on file  Social History Narrative   Married   No regular exercise   Social Determinants of Health   Financial Resource Strain:    Difficulty of Paying Living Expenses: Not on file  Food Insecurity:    Worried About Charity fundraiser in the Last Year: Not on file   [employer] of Food in the Last Year: Not on file  Transportation Needs:    Lack of Transportation (Medical): Not on file   Lack of  Transportation (Non-Medical): Not on file  Physical Activity:    Days of Exercise per Week: Not on file   Minutes of Exercise per Session: Not on file  Stress:    Feeling of Stress : Not on file  Social Connections:    Frequency of Communication with Friends and Family: Not on file   Frequency of Social Gatherings with Friends and Family: Not on file   Attends Religious Services: Not on file   Active Member of Clubs or Organizations: Not on file   Attends Archivist Meetings: Not on file   Marital Status: Not on file    Family History  Problem Relation Age of Onset   Cancer Mother        lung   Hypertension Mother    Hyperlipidemia Mother    Cancer Father        prostate   Hypertension Father    Colon cancer Neg Hx    Diabetes Neg Hx     Outpatient Encounter Medications as of 05/01/2020  Medication Sig   apixaban (ELIQUIS) 5 MG TABS tablet Take 1 tablet (5 mg total) by mouth 2 (two) times daily.   Blood Glucose Monitoring Suppl (ONE TOUCH ULTRA 2) w/Device KIT USE TO TEST BLOOD SUGAR ONCE D UTD   clotrimazole-betamethasone (LOTRISONE) cream Apply 1 application topically 2 (two) times daily as needed (leg infection).    ergocalciferol (VITAMIN D2) 1.25 MG (50000 UT) capsule Take 1 capsule (50,000 Units total) by mouth once a week.   folic acid (FOLVITE) 1 MG tablet TAKE 1 TABLET(1 MG) BY MOUTH DAILY   furosemide (LASIX) 20 MG tablet Take 20 mg by mouth daily as needed for fluid or edema.    insulin degludec (TRESIBA FLEXTOUCH) 100 UNIT/ML FlexTouch Pen Inject 0.35 mLs (35 Units total) into the skin at bedtime.   Insulin Pen Needle (B-D ULTRAFINE III SHORT PEN) 31G X 8 MM MISC 1 each by Does not apply route daily. Use once a day as directed   Lancets (ONETOUCH DELICA PLUS [id number]) MISC USE TO TEST BLOOD SUGAR ONCE D   metFORMIN (GLUCOPHAGE) 500 MG tablet TAKE 1 TABLET(500 MG) BY MOUTH TWICE DAILY WITH A MEAL   ONETOUCH ULTRA test strip USE TO  TEST BLOOD SUGAR 2 TIMES A DAY   rosuvastatin (CRESTOR) 40 MG tablet Take 40 mg by mouth daily.   vitamin B-12 (CYANOCOBALAMIN) 1000 MCG tablet Take 2,000 mcg by mouth daily.   metoprolol tartrate (LOPRESSOR) 25 MG  tablet Take 1 tablet (25 mg total) by mouth 2 (two) times daily.   No facility-administered encounter medications on file as of 05/01/2020.    ALLERGIES: Allergies  Allergen Reactions   Penicillins Rash    Has patient had a PCN reaction causing immediate rash, facial/tongue/throat swelling, SOB or lightheadedness with hypotension: Unknown Has patient had a PCN reaction causing severe rash involving mucus membranes or skin necrosis: Unknown Has patient had a PCN reaction that required hospitalization: Unknown Has patient had a PCN reaction occurring within the last 10 years: No If all of the above answers are "NO", then may proceed with Cephalosporin use.     VACCINATION STATUS: Immunization History  Administered Date(s) Administered   Fluad Quad(high Dose 65+) 08/04/2019   Influenza Split 06/08/2012   Influenza,inj,Quad PF,6+ Mos 07/14/2013, 06/20/2015   Moderna SARS-COVID-2 Vaccination 11/03/2019, 12/05/2019    Diabetes He presents for his follow-up diabetic visit. He has type 2 diabetes mellitus. Onset time: He was diagnosed in March 2021 after at least 2 years of exposure to steroids related to his B-cell lymphoma which required autologous stem cell transplant bone marrow. His disease course has been improving. There are no hypoglycemic associated symptoms. Pertinent negatives for hypoglycemia include no confusion, headaches, pallor or seizures. Associated symptoms include blurred vision. Pertinent negatives for diabetes include no chest pain, no fatigue, no polydipsia, no polyphagia, no polyuria and no weakness. There are no hypoglycemic complications. Symptoms are improving. Diabetic complications include PVD. Risk factors for coronary artery disease include  diabetes mellitus, dyslipidemia, family history, male sex, obesity, tobacco exposure and sedentary lifestyle. Current diabetic treatment includes insulin injections and oral agent (monotherapy) (He is currently on glimepiride 1 mg twice a day.). He is compliant with treatment most of the time. His weight is fluctuating minimally. He is following a generally unhealthy diet. When asked about meal planning, he reported none. He has not had a previous visit with a dietitian. He rarely participates in exercise. His home blood glucose trend is decreasing steadily. His overall blood glucose range is 110-130 mg/dl. (He presents today with his meter and logs showing near target fasting and postprandial glycemic profile.  His 30 day average glucose is 123.  His A1C on 02/12/2020 was 8.2%, but I suspect he is much improved since then.  There are no major episodes of hypoglycemia noted.) An ACE inhibitor/angiotensin II receptor blocker is not being taken. He sees a podiatrist.Eye exam is current.  Hyperlipidemia This is a chronic problem. The current episode started more than 1 year ago. Lipid results: no recent labs to review. Exacerbating diseases include diabetes and obesity. Factors aggravating his hyperlipidemia include beta blockers and fatty foods. Pertinent negatives include no chest pain, myalgias or shortness of breath. Current antihyperlipidemic treatment includes statins. Compliance problems include adherence to exercise and adherence to diet.  Risk factors for coronary artery disease include dyslipidemia, diabetes mellitus, family history, obesity, male sex, hypertension and a sedentary lifestyle.  Hypertension This is a chronic problem. The current episode started more than 1 year ago. The problem has been gradually improving since onset. The problem is controlled. Associated symptoms include blurred vision. Pertinent negatives include no chest pain, headaches, neck pain, palpitations or shortness of breath.  There are no associated agents to hypertension. Risk factors for coronary artery disease include dyslipidemia, diabetes mellitus, male gender, obesity, sedentary lifestyle, smoking/tobacco exposure and family history. Past treatments include beta blockers and diuretics. The current treatment provides mild improvement. Compliance problems include diet and exercise.  Hypertensive end-organ damage includes PVD.    Review of Systems  Constitutional: Negative for chills, fatigue, fever and unexpected weight change.  HENT: Negative for dental problem, mouth sores and trouble swallowing.   Eyes: Positive for blurred vision. Negative for visual disturbance.       Had cataract sx  Respiratory: Negative for cough, choking, chest tightness, shortness of breath and wheezing.   Cardiovascular: Negative for chest pain, palpitations and leg swelling.  Gastrointestinal: Negative for abdominal distention, abdominal pain, constipation, diarrhea, nausea and vomiting.  Endocrine: Negative for polydipsia, polyphagia and polyuria.  Genitourinary: Negative for dysuria, flank pain, hematuria and urgency.  Musculoskeletal: Positive for gait problem. Negative for back pain, myalgias and neck pain.       Walks with cane  Skin: Negative for color change, pallor and rash.  Neurological: Negative for seizures, syncope, weakness, numbness and headaches.  Psychiatric/Behavioral: Negative.  Negative for confusion and dysphoric mood.    Objective:    BP (!) 152/81 (BP Location: Right Arm, Patient Position: Sitting)    Pulse 79    Ht 5' 11" (1.803 m)    Wt 272 lb (123.4 kg)    BMI 37.94 kg/m   Wt Readings from Last 3 Encounters:  05/01/20 272 lb (123.4 kg)  04/17/20 277 lb 11.2 oz (126 kg)  04/11/20 272 lb 11.3 oz (123.7 kg)    BP Readings from Last 3 Encounters:  05/01/20 (!) 152/81  04/26/20 (!) 144/59  04/19/20 131/70    Physical Exam- Limited  Constitutional:  Body mass index is 37.94 kg/m. , not in acute  distress, normal state of mind Eyes:  EOMI, no exophthalmos Neck: Supple Thyroid: No gross goiter Cardiovascular: RRR, no murmers, rubs, or gallops, no edema Respiratory: Adequate breathing efforts, no crackles, rales, rhonchi, or wheezing Musculoskeletal: no gross deformities, strength intact in all four extremities, no gross restriction of joint movements, walks with a cane Skin:  no rashes, no hyperemia Neurological: no tremor with outstretched hands  CMP ( most recent) CMP     Component Value Date/Time   NA 142 05/01/2020 0931   K 3.6 05/01/2020 0931   CL 105 05/01/2020 0931   CO2 27 05/01/2020 0931   GLUCOSE 100 (H) 05/01/2020 0931   BUN 11 05/01/2020 0931   CREATININE 1.11 05/01/2020 0931   CALCIUM 9.1 05/01/2020 0931   PROT 6.5 05/01/2020 0931   ALBUMIN 3.6 05/01/2020 0931   AST 17 05/01/2020 0931   ALT 12 05/01/2020 0931   ALKPHOS 67 05/01/2020 0931   BILITOT 0.8 05/01/2020 0931   GFRNONAA >60 05/01/2020 0931   GFRAA >60 05/01/2020 0931    Diabetic Labs (most recent): Lab Results  Component Value Date   HGBA1C 8.2 (H) 02/12/2020   HGBA1C >15.5 (H) 11/21/2019   HGBA1C 4.9 03/29/2019    Lab Results  Component Value Date   TSH 1.122 09/08/2017      Assessment & Plan:   1. Uncontrolled type 2 diabetes mellitus with hyperglycemia (HCC)  - [place] has currently uncontrolled symptomatic type 2 DM since  69 years of age.  He presents today with his meter and logs showing near target fasting and postprandial glycemic profile.  His 30 day average glucose is 123.  His A1C on 02/12/2020 was 8.2%, but I suspect he is much improved since then.  There are no major episodes of hypoglycemia noted.  - Recent labs reviewed. - I had a long discussion with him about the progressive nature of  diabetes and the pathology behind its complications. -his diabetes is complicated by obesity/sedentary life, peripheral arterial disease with venous stasis and he remains at a high  risk for more acute and chronic complications which include CAD, CVA, CKD, retinopathy, and neuropathy. These are all discussed in detail with him.  - The patient admits there is a room for improvement in their diet and drink choices. -  Suggestion is made for the patient to avoid simple carbohydrates from their diet including Cakes, Sweet Desserts / Pastries, Ice Cream, Soda (diet and regular), Sweet Tea, Candies, Chips, Cookies, Sweet Pastries,  Store Bought Juices, Alcohol in Excess of  1-2 drinks a day, Artificial Sweeteners, Coffee Creamer, and "Sugar-free" Products. This will help patient to have stable blood glucose profile and potentially avoid unintended weight gain.   - I encouraged the patient to switch to  unprocessed or minimally processed complex starch and increased protein intake (animal or plant source), fruits, and vegetables.   - Patient is advised to stick to a routine mealtimes to eat 3 meals  a day and avoid unnecessary snacks ( to snack only to correct hypoglycemia).  - I have approached him with the following individualized plan to manage  his diabetes and patient agrees:   -Given his presentation with controlled glycemic profile, he will not need bolus insulin at this time.   -He is tolerating his current dose of Tresiba 35 units SQ nightly well and is advised to continue.  He is also advised to continue Metformin 500 mg po twice daily with meals. -He is encouraged to monitor blood glucose levels twice daily, before breakfast and before bed and report blood glucose levels less than 70 or greater than 200 for 3 tests in a row.  - he is not a candidate for TZDs, SGLT2 inhibitors due to compromised peripheral circulation.    - he will be considered for incretin therapy as appropriate next visit.  - Specific targets for  A1c;  LDL, HDL,  and Triglycerides were discussed with the patient.  2) Blood Pressure /Hypertension:  His blood pressure is not controlled to target.  He  is advised to continue Lasix 20 mg po daily as needed, and metoprolol 25 mg po twice daily.  He is advised to adopt a low sodium diet.  3) Lipids/Hyperlipidemia:  There is no recent lipid panel to review.  He is advised to continue Crestor 40 mg po daily at bedtime.  Side effects and precautions discussed with him.  Will check lipid panel prior to next visit.  4)  Weight/Diet:   Body mass index is 37.94 kg/m.  -   clearly complicating his diabetes care.   he is  a candidate for weight loss. I discussed with him the fact that loss of 5 - 10% of his  current body weight will have the most impact on his diabetes management.  Exercise, and detailed carbohydrates information provided  -  detailed on discharge instructions.  5) Chronic Care/Health Maintenance: -he  is on Statin medications and is encouraged to initiate and continue to follow up with Ophthalmology, Dentist,  Podiatrist at least yearly or according to recommendations, and advised to   stay away from smoking. I have recommended yearly flu vaccine and pneumonia vaccine at least every 5 years; moderate intensity exercise for up to 150 minutes weekly; and  sleep for at least 7 hours a day.  - he is  advised to maintain close follow up with Redmond School, MD for primary  care needs, as well as his other providers for optimal and coordinated care.   - Time spent on this patient care encounter:  35 min, of which > 50% was spent in  counseling and the rest reviewing his blood glucose logs , discussing his hypoglycemia and hyperglycemia episodes, reviewing his current and  previous labs / studies  ( including abstraction from other facilities) and medications  doses and developing a  long term treatment plan and documenting his care.   Please refer to Patient Instructions for Blood Glucose Monitoring and Insulin/Medications Dosing Guide"  in media tab for additional information. Please  also refer to " Patient Self Inventory" in the Media  tab for  reviewed elements of pertinent patient history.  [name] participated in the discussions, expressed understanding, and voiced agreement with the above plans.  All questions were answered to his satisfaction. he is encouraged to contact clinic should he have any questions or concerns prior to his return visit. [name] participated in the discussions, expressed understanding, and voiced agreement with the above plans.  All questions were answered to his satisfaction. he is encouraged to contact clinic should he have any questions or concerns prior to his return visit.   Follow up plan: - Return in about 4 months (around 08/31/2020) for Diabetes follow up, Previsit labs, Virtual visit ok.    [name], FNP-BC [facility] Phone: [phone] Fax: [phone]   05/01/2020, 1:37 PM  This note was partially dictated with voice recognition software. Similar sounding words can be transcribed inadequately or may not  be corrected upon review.

## 2020-05-01 NOTE — Progress Notes (Signed)
  Medical Nutrition Therapy:  Appt start time: 5277 end time: 1400   Assessment:  Primary concerns today:  Diabetes Type 2, Obesity, HTN and Hyperlipidemia.  Relative drove him today.  Sees Dr. Dorris Fetch, Endocrinology.  FBS 90-100's.  Tresiba 35 units a day. The lowest blood sugar has bene 95 mg/dl. He is doing better on portions and timing of meals. He is now going to testing his blood sugars 2 times per day. Cut out fried foods. Appetite is smaller now. Has increased fresh fruits and vegetables. BS are much better. Feel a lot better. Walking with a cane. Has had 2 iron infusions in the last few weeks. Has been a little weak from low iron levels. Goes back to oncologist tomorrow. H/o Non hodgkins lymphoma in 2006- had stem cell transplant. Complains of having a lot more gas and bloating recently. He went to podiatrist.   Lab Results  Component Value Date   HGBA1C 8.2 (H) 02/12/2020    Wt Readings from Last 3 Encounters:  05/01/20 272 lb (123.4 kg)  05/01/20 272 lb (123.4 kg)  04/17/20 277 lb 11.2 oz (126 kg)   Ht Readings from Last 3 Encounters:  05/01/20 5\' 11"  (1.803 m)  05/01/20 5\' 11"  (1.803 m)  01/25/20 5\' 11"  (1.803 m)   Body mass index is 37.94 kg/m. @BMIFA @ Facility age limit for growth percentiles is 20 years. Facility age limit for growth percentiles is 20 years.    24 hr recall B 1 slice toast and boiled egg or leftover salad. L 2 pm Boiled chicken, green peas, water,  D- Broiled fish and 1 slice bread, water  Usual physical activity: ADL  Estimated energy needs: 1800 calories 200 g carbohydrates 135 g protein 50 g fat  Progress Towards Goal(s):  In progress.   Nutritional Diagnosis:  NB-1.1 Food and nutrition-related knowledge deficit As related to Diabetes Type 2.  As evidenced by A1C > 15%.    Intervention:  Nutrition and Diabetes education provided on My Plate, CHO counting, meal planning, portion sizes, timing of meals, avoiding snacks between  meals unless having a low blood sugar, target ranges for A1C and blood sugars, signs/symptoms and treatment of hyper/hypoglycemia, monitoring blood sugars, taking medications as prescribed, benefits of exercising 30 minutes per day and prevention of complications of DM.  Marland Kitchen Goals  Increase lower carb vegetables Don't skip meals. Drink a glucerna or equivalent if not able to eat a meal. Get A1C to 7% or less. Lose 2-3 lbs per month.  Teaching Method Utilized:  Visual Auditory Hands on  Handouts given during visit include:  The Plate method  Diabetes instructions Herbs to season with.  Barriers to learning/adherence to lifestyle change: limited mobility  Demonstrated degree of understanding via:  Teach Back   Monitoring/Evaluation:  Dietary intake, exercise, , and body weight in 3 month.Marland Kitchen

## 2020-05-02 ENCOUNTER — Inpatient Hospital Stay (HOSPITAL_BASED_OUTPATIENT_CLINIC_OR_DEPARTMENT_OTHER): Payer: Medicare Other | Admitting: Nurse Practitioner

## 2020-05-02 VITALS — BP 141/65 | HR 79 | Temp 98.2°F | Resp 16 | Wt 271.8 lb

## 2020-05-02 DIAGNOSIS — C8330 Diffuse large B-cell lymphoma, unspecified site: Secondary | ICD-10-CM

## 2020-05-02 DIAGNOSIS — Z8572 Personal history of non-Hodgkin lymphomas: Secondary | ICD-10-CM | POA: Diagnosis not present

## 2020-05-02 DIAGNOSIS — D591 Autoimmune hemolytic anemia, unspecified: Secondary | ICD-10-CM | POA: Diagnosis not present

## 2020-05-02 DIAGNOSIS — D5919 Other autoimmune hemolytic anemia: Secondary | ICD-10-CM | POA: Diagnosis not present

## 2020-05-02 DIAGNOSIS — E1165 Type 2 diabetes mellitus with hyperglycemia: Secondary | ICD-10-CM | POA: Diagnosis not present

## 2020-05-02 NOTE — Progress Notes (Signed)
[place], [place] [address]   CLINIC:  Medical Oncology/Hematology  PCP:  [facility], [place] [address] [phone]   REASON FOR VISIT: Follow-up for autoimmune hemolytic anemia   CURRENT THERAPY: Surveillance  BRIEF ONCOLOGIC HISTORY:  Oncology History Overview Note  S/P chemotherapy following stabilization of the spine.  Initial biopsy was on 07/13/2005 on his bone marrow.  Thoracic vertebra was biopsied on 02/04/2005 and mediastinum on 02/03/2005.  S/P R-CHOP and achieved an incomplete response, then underwent ICE chemotherapy pre-transplant and total body radiation with autologous stem cell transplant on 01/15/2006 and maintained on Rituxan for 2 years.  Thus far without recurrence.   DLBCL (diffuse large B cell lymphoma) ([place])  01/27/2005 Imaging   PET- diffuse activity  involving the L cervical area, extensive activity in the mediastinumn particularly in the R paratracheal area and to a lesser degree in the anterior mediastinum.  Lg area in porta caval nodes in upper abd and diffuse bone activity   02/03/2005 Pathology Results   Mediastinal lymph node demonstrating diffuse large cell lymphoma, B-type.  Vertebral bone biopsy demonstrating diffuse large b-cell lymphoma   03/11/2005 - 08/04/2005 Chemotherapy   R-CHOP x 8 cycles   05/04/2005 Imaging   PET- near resolution of previously identified uptake.   07/13/2005 Bone Marrow Biopsy   Bone marrow aspiration and biopsy is negative for lymphoma   08/31/2005 Imaging   PET- further response to therapy with findings consistent with residual disease.  Hypermetabolic activity identified within the anterior mediastinal lymph node (smaller but still pathologic).  Borderline hypermetabolic small right pleural effusion   10/20/2005 - 11/13/2005 Chemotherapy   R-ICE x 2 cycles at [facility]   01/08/2006 - 01/09/2006 Chemotherapy   Cyclophosphamide 600 mg daily x 2     01/10/2006 - 01/14/2006 Radiation Therapy   Total body radiation twice daily   01/15/2006 Bone Marrow Transplant   Stem cell transplant at [facility]   04/05/2006 - 12/05/2007 Chemotherapy   Rituxan weekly x 4 every 6 months x 2 years     CANCER STAGING: Cancer Staging DLBCL (diffuse large B cell lymphoma) (HCC) Staging form: Lymphoid Neoplasms, AJCC 6th Edition - Clinical: Stage IV - Signed by Baird Cancer, PA on 04/29/2011    INTERVAL HISTORY:  [name] 69 y.o. male returns for routine follow-up for autoimmune hemolytic anemia.  Patient reports he is doing well since his last visit.  He denies any bleeding issues.  He denies any extreme fatigue. Denies any nausea, vomiting, or diarrhea. Denies any new pains. Had not noticed any recent bleeding such as epistaxis, hematuria or hematochezia. Denies recent chest pain on exertion, shortness of breath on minimal exertion, pre-syncopal episodes, or palpitations. Denies any numbness or tingling in hands or feet. Denies any recent fevers, infections, or recent hospitalizations. Patient reports appetite at 75% and energy level at 100%.  He is eating well maintain his weight this time.    REVIEW OF SYSTEMS:  Review of Systems  Respiratory: Positive for cough and shortness of breath.   All other systems reviewed and are negative.    PAST MEDICAL/SURGICAL HISTORY:  Past Medical History:  Diagnosis Date  . Asthma    as child  . Diabetes mellitus without complication ([place])   . DLBCL (diffuse large B cell lymphoma) ([place]) 02/28/2009  . Edema   . Heart failure, diastolic, chronic ([place])    patient denies  . Hyperlipidemia, mixed   . Hypertension   .  IDA (iron deficiency anemia)   . Large cell lymphoma ([place]) 01/2005   autologous stem cell transplant 12/2005  . Obesity, morbid (more than 100 lbs over ideal weight or BMI > 40) (HCC)   . Venous insufficiency 04/29/2011   Past Surgical History:  Procedure Laterality Date  . BACK SURGERY  2006    T6 vertebrae removed/titanium placed  . CATARACT EXTRACTION W/PHACO Right 03/31/2019   Procedure: CATARACT EXTRACTION PHACO AND INTRAOCULAR LENS PLACEMENT (IOC);  Surgeon: Baruch Goldmann, MD;  Location: [facility];  Service: Ophthalmology;  Laterality: Right;  CDE: 3.21  . CATARACT EXTRACTION W/PHACO Left 04/14/2019   Procedure: CATARACT EXTRACTION PHACO AND INTRAOCULAR LENS PLACEMENT (IOC);  Surgeon: Baruch Goldmann, MD;  Location: [facility];  Service: Ophthalmology;  Laterality: Left;  left - pt knows to arrive at 7:45, CDE: 3.55  . COLONOSCOPY  11/24/2004   Polyps in the left colon ablated/removed as described above.  Two submucosal lesions consistent with lipomas as described above not  manipulated./ Normal rectum  . COLONOSCOPY  06/20/2012   MULTIPLE RECTAL AND COLONIC POLYPS  . COLONOSCOPY N/A 03/08/2015   RMR: Capacious, redundant colon. Multiple colonic and rectosigmoid polyps removed. ablated as described above. colonic lipoma abnormal appearing terminal ileum likely a variant of normal). Howeverwith history  biopsies obtained.   . ESOPHAGOGASTRODUODENOSCOPY N/A 03/08/2015   RMR: Hiatal hernia Polypoid gastric mucosa with multiple gastric polyps. largest polyp removed via snare polypectomgy hemostasis clip placed at base. Status post gastric biopsy. Status post video capsule placement.   Marland Kitchen GIVENS CAPSULE STUDY N/A 03/08/2015   Procedure: GIVENS CAPSULE STUDY;  Surgeon: Daneil Dolin, MD;  Location: [facility];  Service: Endoscopy;  Laterality: N/A;  . LIMBAL STEM CELL TRANSPLANT    . LUNG BIOPSY  6/06  . MULTIPLE TOOTH EXTRACTIONS  04/2005  . port a cath placement    . PORT-A-CATH REMOVAL  09/08/2012   Procedure: REMOVAL PORT-A-CATH;  Surgeon: Melrose Nakayama, MD;  Location: [facility];  Service: Thoracic;  Laterality: N/A;     SOCIAL HISTORY:  Social History   Socioeconomic History  . Marital status: Married    Spouse name: Not on file  . Number of children: 2  . Years of education: Not  on file  . Highest education level: Not on file  Occupational History  . Occupation: disability due to back    Employer: UNEMPLOYED  Tobacco Use  . Smoking status: Former Smoker    Packs/day: 0.50    Years: 15.00    Pack years: 7.50    Quit date: 11/11/2002    Years since quitting: 17.4  . Smokeless tobacco: Never Used  Vaping Use  . Vaping Use: Never used  Substance and Sexual Activity  . Alcohol use: No    Alcohol/week: 0.0 standard drinks  . Drug use: No  . Sexual activity: Yes    Birth control/protection: None  Other Topics Concern  . Not on file  Social History Narrative   Married   No regular exercise   Social Determinants of Health   Financial Resource Strain:   . Difficulty of Paying Living Expenses: Not on file  Food Insecurity:   . Worried About Charity fundraiser in the Last Year: Not on file  . Ran Out of Food in the Last Year: Not on file  Transportation Needs:   . Lack of Transportation (Medical): Not on file  . Lack of Transportation (Non-Medical): Not on file  Physical Activity:   .  Days of Exercise per Week: Not on file  . Minutes of Exercise per Session: Not on file  Stress:   . Feeling of Stress : Not on file  Social Connections:   . Frequency of Communication with Friends and Family: Not on file  . Frequency of Social Gatherings with Friends and Family: Not on file  . Attends Religious Services: Not on file  . Active Member of Clubs or Organizations: Not on file  . Attends Archivist Meetings: Not on file  . Marital Status: Not on file  Intimate Partner Violence:   . Fear of Current or Ex-Partner: Not on file  . Emotionally Abused: Not on file  . Physically Abused: Not on file  . Sexually Abused: Not on file    FAMILY HISTORY:  Family History  Problem Relation Age of Onset  . Cancer Mother        lung  . Hypertension Mother   . Hyperlipidemia Mother   . Cancer Father        prostate  . Hypertension Father   . Colon cancer  Neg Hx   . Diabetes Neg Hx     CURRENT MEDICATIONS:  Outpatient Encounter Medications as of 05/02/2020  Medication Sig  . apixaban (ELIQUIS) 5 MG TABS tablet Take 1 tablet (5 mg total) by mouth 2 (two) times daily.  . Blood Glucose Monitoring Suppl (ONE TOUCH ULTRA 2) w/Device KIT USE TO TEST BLOOD SUGAR ONCE D UTD  . clotrimazole-betamethasone (LOTRISONE) cream Apply 1 application topically 2 (two) times daily as needed (leg infection).   Marland Kitchen ergocalciferol (VITAMIN D2) 1.25 MG (50000 UT) capsule Take 1 capsule (50,000 Units total) by mouth once a week.  . folic acid (FOLVITE) 1 MG tablet TAKE 1 TABLET(1 MG) BY MOUTH DAILY  . furosemide (LASIX) 20 MG tablet Take 20 mg by mouth daily as needed for fluid or edema.   . insulin degludec (TRESIBA FLEXTOUCH) 100 UNIT/ML FlexTouch Pen Inject 0.35 mLs (35 Units total) into the skin at bedtime.  . Insulin Pen Needle (B-D ULTRAFINE III SHORT PEN) 31G X 8 MM MISC 1 each by Does not apply route daily. Use once a day as directed  . Lancets (ONETOUCH DELICA PLUS [id number]) MISC Use to check blood glucose twice daily, before breakfast and before bed  . metFORMIN (GLUCOPHAGE) 500 MG tablet TAKE 1 TABLET(500 MG) BY MOUTH TWICE DAILY WITH A MEAL  . ONETOUCH ULTRA test strip USE TO TEST BLOOD SUGAR 2 TIMES A DAY  . rosuvastatin (CRESTOR) 40 MG tablet Take 40 mg by mouth daily.  . vitamin B-12 (CYANOCOBALAMIN) 1000 MCG tablet Take 2,000 mcg by mouth daily.  . metoprolol tartrate (LOPRESSOR) 25 MG tablet Take 1 tablet (25 mg total) by mouth 2 (two) times daily.   No facility-administered encounter medications on file as of 05/02/2020.    ALLERGIES:  Allergies  Allergen Reactions  . Penicillins Rash    Has patient had a PCN reaction causing immediate rash, facial/tongue/throat swelling, SOB or lightheadedness with hypotension: Unknown Has patient had a PCN reaction causing severe rash involving mucus membranes or skin necrosis: Unknown Has patient had a PCN  reaction that required hospitalization: Unknown Has patient had a PCN reaction occurring within the last 10 years: No If all of the above answers are "NO", then may proceed with Cephalosporin use.      PHYSICAL EXAM:  ECOG Performance status: 1  Vitals:   05/02/20 1053  BP: (!) 141/65  Pulse: 79  Resp: 16  Temp: 98.2 F (36.8 C)  SpO2: 98%   Filed Weights   05/02/20 1053  Weight: 271 lb 13.2 oz (123.3 kg)   Physical Exam Constitutional:      Appearance: Normal appearance. He is normal weight.  Cardiovascular:     Rate and Rhythm: Normal rate and regular rhythm.     Heart sounds: Normal heart sounds.  Pulmonary:     Effort: Pulmonary effort is normal.     Breath sounds: Normal breath sounds.  Abdominal:     General: Bowel sounds are normal.     Palpations: Abdomen is soft.  Musculoskeletal:        General: Normal range of motion.  Skin:    General: Skin is warm.  Neurological:     Mental Status: He is alert and oriented to person, place, and time. Mental status is at baseline.  Psychiatric:        Mood and Affect: Mood normal.        Behavior: Behavior normal.        Thought Content: Thought content normal.        Judgment: Judgment normal.      LABORATORY DATA:  I have reviewed the labs as listed.  CBC    Component Value Date/Time   WBC 4.1 05/01/2020 0931   RBC 3.82 (L) 05/01/2020 0931   RBC 3.79 (L) 05/01/2020 0931   HGB 12.2 (L) 05/01/2020 0931   HCT 38.0 (L) 05/01/2020 0931   PLT 186 05/01/2020 0931   MCV 99.5 05/01/2020 0931   MCH 31.9 05/01/2020 0931   MCHC 32.1 05/01/2020 0931   RDW 15.9 (H) 05/01/2020 0931   LYMPHSABS 0.6 (L) 05/01/2020 0931   MONOABS 0.7 05/01/2020 0931   EOSABS 0.4 05/01/2020 0931   BASOSABS 0.0 05/01/2020 0931   CMP Latest Ref Rng & Units 05/01/2020 04/16/2020 04/11/2020  Glucose 70 - 99 mg/dL 100(H) 129(H) 110(H)  BUN 8 - 23 mg/dL 11 8 11   Creatinine 0.61 - 1.24 mg/dL 1.11 1.04 1.00  Sodium 135 - 145 mmol/L 142 141 143   Potassium 3.5 - 5.1 mmol/L 3.6 3.1(L) 3.4(L)  Chloride 98 - 111 mmol/L 105 106 106  CO2 22 - 32 mmol/L 27 26 26   Calcium 8.9 - 10.3 mg/dL 9.1 8.5(L) 9.0  Total Protein 6.5 - 8.1 g/dL 6.5 6.0(L) 6.1(L)  Total Bilirubin 0.3 - 1.2 mg/dL 0.8 0.5 0.8  Alkaline Phos 38 - 126 U/L 67 66 65  AST 15 - 41 U/L 17 19 19   ALT 0 - 44 U/L 12 18 20     All questions were answered to patient's stated satisfaction. Encouraged patient to call with any new concerns or questions before his next visit to the cancer center and we can certain see him sooner, if needed.     ASSESSMENT & PLAN:  DLBCL (diffuse large B cell lymphoma) ([place]) 1.  Autoimmune hemolytic anemia: -Prednisone 120 mg daily started on 08/05/2019, rituximab 4 doses from 08/15/2019 through 09/05/2019. -He was last seen on 12/22/2019 and the plan was to cut prednisone to 5 mg daily for 2 weeks followed by 5 mg every other day for the next 2 weeks. -We gave 2 IV iron infusions on 01/05/2020 and 01/12/2020 due to his ferritin being 92% saturation 18. -He has been off of his prednisone since the end of July 2021 -Labs done on 05/01/2020 showed hemoglobin 12.2, platelets 186, reticulocytes 3.6, ferritin 539, percent saturation 17.  Bilirubin 0.8, LDH 181 -We  will see him back in 3 week.  2.  Leg swelling: -He will use Lasix 20 mg daily as needed.  3.  Poorly controlled diabetes: -Glucose today is 110.  He is taking Amaryl 2 mg twice daily. -Patient reports it is hard to control his diabetes with the prednisone. -Hemoglobin A1c 8.2  4.  DLBCL: -Diagnosed in 2006, treated with R-CHOP with complete response on PET scan. -Relapse of lymphoma on 08/31/2005, treated with salvage chemotherapy followed by stem cell transplant on 01/15/2006. -Rituximab weekly x4 followed by every 6 months for 2 years completed on 12/05/2007. -PET scan on 08/14/2019 did not show any evidence or recurrence of lymphoma. -No B symptoms at this time.     Orders placed this  encounter:  Orders Placed This Encounter  Procedures  . CBC with Differential/Platelet  . Comprehensive metabolic panel  . Ferritin  . Iron and TIBC  . Lactate dehydrogenase  . Reticulocytes  . Vitamin B12  . Folate      Francene Finders, FNP-C [place] [phone]

## 2020-05-02 NOTE — Assessment & Plan Note (Signed)
1.  Autoimmune hemolytic anemia: -Prednisone 120 mg daily started on 08/05/2019, rituximab 4 doses from 08/15/2019 through 09/05/2019. -He was last seen on 12/22/2019 and the plan was to cut prednisone to 5 mg daily for 2 weeks followed by 5 mg every other day for the next 2 weeks. -We gave 2 IV iron infusions on 01/05/2020 and 01/12/2020 due to his ferritin being 92% saturation 18. -He has been off of his prednisone since the end of July 2021 -Labs done on 05/01/2020 showed hemoglobin 12.2, platelets 186, reticulocytes 3.6, ferritin 539, percent saturation 17.  Bilirubin 0.8, LDH 181 -We will see him back in 3 week.  2.  Leg swelling: -He will use Lasix 20 mg daily as needed.  3.  Poorly controlled diabetes: -Glucose today is 110.  He is taking Amaryl 2 mg twice daily. -Patient reports it is hard to control his diabetes with the prednisone. -Hemoglobin A1c 8.2  4.  DLBCL: -Diagnosed in 2006, treated with R-CHOP with complete response on PET scan. -Relapse of lymphoma on 08/31/2005, treated with salvage chemotherapy followed by stem cell transplant on 01/15/2006. -Rituximab weekly x4 followed by every 6 months for 2 years completed on 12/05/2007. -PET scan on 08/14/2019 did not show any evidence or recurrence of lymphoma. -No B symptoms at this time.

## 2020-05-04 ENCOUNTER — Other Ambulatory Visit (HOSPITAL_COMMUNITY): Payer: Self-pay | Admitting: Hematology

## 2020-05-09 ENCOUNTER — Other Ambulatory Visit (HOSPITAL_COMMUNITY): Payer: Medicare Other

## 2020-05-09 ENCOUNTER — Ambulatory Visit (HOSPITAL_COMMUNITY): Payer: Medicare Other | Admitting: Nurse Practitioner

## 2020-05-15 ENCOUNTER — Other Ambulatory Visit: Payer: Self-pay

## 2020-05-15 DIAGNOSIS — I872 Venous insufficiency (chronic) (peripheral): Secondary | ICD-10-CM

## 2020-05-23 ENCOUNTER — Inpatient Hospital Stay (HOSPITAL_BASED_OUTPATIENT_CLINIC_OR_DEPARTMENT_OTHER): Payer: Medicare Other | Admitting: Nurse Practitioner

## 2020-05-23 ENCOUNTER — Inpatient Hospital Stay (HOSPITAL_COMMUNITY): Payer: Medicare Other

## 2020-05-23 ENCOUNTER — Other Ambulatory Visit: Payer: Self-pay

## 2020-05-23 DIAGNOSIS — D5919 Other autoimmune hemolytic anemia: Secondary | ICD-10-CM | POA: Diagnosis not present

## 2020-05-23 DIAGNOSIS — E1165 Type 2 diabetes mellitus with hyperglycemia: Secondary | ICD-10-CM | POA: Diagnosis not present

## 2020-05-23 DIAGNOSIS — E7849 Other hyperlipidemia: Secondary | ICD-10-CM | POA: Diagnosis not present

## 2020-05-23 DIAGNOSIS — Z8572 Personal history of non-Hodgkin lymphomas: Secondary | ICD-10-CM | POA: Diagnosis not present

## 2020-05-23 DIAGNOSIS — D591 Autoimmune hemolytic anemia, unspecified: Secondary | ICD-10-CM | POA: Diagnosis not present

## 2020-05-23 DIAGNOSIS — Z720 Tobacco use: Secondary | ICD-10-CM | POA: Diagnosis not present

## 2020-05-23 DIAGNOSIS — I2510 Atherosclerotic heart disease of native coronary artery without angina pectoris: Secondary | ICD-10-CM | POA: Diagnosis not present

## 2020-05-23 LAB — COMPREHENSIVE METABOLIC PANEL
ALT: 14 U/L (ref 0–44)
AST: 18 U/L (ref 15–41)
Albumin: 3.5 g/dL (ref 3.5–5.0)
Alkaline Phosphatase: 65 U/L (ref 38–126)
Anion gap: 7 (ref 5–15)
BUN: 15 mg/dL (ref 8–23)
CO2: 28 mmol/L (ref 22–32)
Calcium: 9 mg/dL (ref 8.9–10.3)
Chloride: 105 mmol/L (ref 98–111)
Creatinine, Ser: 1.21 mg/dL (ref 0.61–1.24)
GFR calc Af Amer: >60 mL/min (ref >60)
GFR calc non Af Amer: >60 mL/min (ref >60)
Glucose, Bld: 98 mg/dL (ref 70–99)
Potassium: 3.7 mmol/L (ref 3.5–5.1)
Sodium: 140 mmol/L (ref 135–145)
Total Bilirubin: 0.5 mg/dL (ref 0.3–1.2)
Total Protein: 6.2 g/dL — ABNORMAL LOW (ref 6.5–8.1)

## 2020-05-23 LAB — IRON AND TIBC
Iron: 57 ug/dL (ref 45–182)
Saturation Ratios: 18 % (ref 17.9–39.5)
TIBC: 319 ug/dL (ref 250–450)
UIBC: 262 ug/dL

## 2020-05-23 LAB — CBC WITH DIFFERENTIAL/PLATELET
Abs Immature Granulocytes: 0.01 10*3/uL (ref 0.00–0.07)
Basophils Absolute: 0 10*3/uL (ref 0.0–0.1)
Basophils Relative: 1 %
Eosinophils Absolute: 0.3 10*3/uL (ref 0.0–0.5)
Eosinophils Relative: 7 %
HCT: 38.6 % — ABNORMAL LOW (ref 39.0–52.0)
Hemoglobin: 12.7 g/dL — ABNORMAL LOW (ref 13.0–17.0)
Immature Granulocytes: 0 %
Lymphocytes Relative: 12 %
Lymphs Abs: 0.5 10*3/uL — ABNORMAL LOW (ref 0.7–4.0)
MCH: 32.3 pg (ref 26.0–34.0)
MCHC: 32.9 g/dL (ref 30.0–36.0)
MCV: 98.2 fL (ref 80.0–100.0)
Monocytes Absolute: 0.5 10*3/uL (ref 0.1–1.0)
Monocytes Relative: 14 %
Neutro Abs: 2.5 10*3/uL (ref 1.7–7.7)
Neutrophils Relative %: 66 %
Platelets: 135 10*3/uL — ABNORMAL LOW (ref 150–400)
RBC: 3.93 MIL/uL — ABNORMAL LOW (ref 4.22–5.81)
RDW: 13.9 % (ref 11.5–15.5)
WBC: 3.8 10*3/uL — ABNORMAL LOW (ref 4.0–10.5)
nRBC: 0 % (ref 0.0–0.2)

## 2020-05-23 LAB — FERRITIN: Ferritin: 325 ng/mL (ref 24–336)

## 2020-05-23 LAB — RETICULOCYTES
Immature Retic Fract: 20.3 % — ABNORMAL HIGH (ref 2.3–15.9)
RBC.: 4 MIL/uL — ABNORMAL LOW (ref 4.22–5.81)
Retic Count, Absolute: 86 10*3/uL (ref 19.0–186.0)
Retic Ct Pct: 2.2 % (ref 0.4–3.1)

## 2020-05-23 LAB — LACTATE DEHYDROGENASE: LDH: 144 U/L (ref 98–192)

## 2020-05-23 LAB — VITAMIN B12: Vitamin B-12: 1650 pg/mL — ABNORMAL HIGH (ref 180–914)

## 2020-05-23 LAB — FOLATE: Folate: 24.9 ng/mL (ref >5.9)

## 2020-05-23 NOTE — Assessment & Plan Note (Signed)
1.  Autoimmune hemolytic anemia: -Prednisone 120 mg daily started on 08/05/2019, rituximab 4 doses from 08/15/2019 through 09/05/2019. -Prednisone was tapered off very slowly. -We gave 2 IV iron infusions on 01/05/2020 and 01/12/2020 due to his ferritin being 92, percent saturation 18. -He has been off of his prednisone since the end of July 2021 -Labs done on 05/23/2020 showed hemoglobin 12.7, platelets 135, reticulocytes 2.2, bilirubin 0.5, LDH 144 -We will see him back in 4 weeks.  2.  Leg swelling: -He will use Lasix 20 mg daily as needed.  3.  Poorly controlled diabetes: -Glucose today is 110.  He is taking Amaryl 2 mg twice daily. -Patient reports it is hard to control his diabetes with the prednisone. -Hemoglobin A1c 8.2  4.  DLBCL: -Diagnosed in 2006, treated with R-CHOP with complete response on PET scan. -Relapse of lymphoma on 08/31/2005, treated with salvage chemotherapy followed by stem cell transplant on 01/15/2006. -Rituximab weekly x4 followed by every 6 months for 2 years completed on 12/05/2007. -PET scan on 08/14/2019 did not show any evidence or recurrence of lymphoma. -No B symptoms at this time.

## 2020-05-23 NOTE — Progress Notes (Signed)
[address] [place], [place] [address]   CLINIC:  Medical Oncology/Hematology  PCP:  [facility], [place] [address] [phone]   REASON FOR VISIT: Follow-up for autoimmune hemolytic anemia   CURRENT THERAPY: Surveillance  BRIEF ONCOLOGIC HISTORY:  Oncology History Overview Note  S/P chemotherapy following stabilization of the spine.  Initial biopsy was on 07/13/2005 on his bone marrow.  Thoracic vertebra was biopsied on 02/04/2005 and mediastinum on 02/03/2005.  S/P R-CHOP and achieved an incomplete response, then underwent ICE chemotherapy pre-transplant and total body radiation with autologous stem cell transplant on 01/15/2006 and maintained on Rituxan for 2 years.  Thus far without recurrence.   DLBCL (diffuse large B cell lymphoma) ([facility])  01/27/2005 Imaging   PET- diffuse activity  involving the L cervical area, extensive activity in the mediastinumn particularly in the R paratracheal area and to a lesser degree in the anterior mediastinum.  Lg area in porta caval nodes in upper abd and diffuse bone activity   02/03/2005 Pathology Results   Mediastinal lymph node demonstrating diffuse large cell lymphoma, B-type.  Vertebral bone biopsy demonstrating diffuse large b-cell lymphoma   03/11/2005 - 08/04/2005 Chemotherapy   R-CHOP x 8 cycles   05/04/2005 Imaging   PET- near resolution of previously identified uptake.   07/13/2005 Bone Marrow Biopsy   Bone marrow aspiration and biopsy is negative for lymphoma   08/31/2005 Imaging   PET- further response to therapy with findings consistent with residual disease.  Hypermetabolic activity identified within the anterior mediastinal lymph node (smaller but still pathologic).  Borderline hypermetabolic small right pleural effusion   10/20/2005 - 11/13/2005 Chemotherapy   R-ICE x 2 cycles at [facility]   01/08/2006 - 01/09/2006 Chemotherapy   Cyclophosphamide 600 mg daily x 2     01/10/2006 - 01/14/2006 Radiation Therapy   Total body radiation twice daily   01/15/2006 Bone Marrow Transplant   Stem cell transplant at [facility]   04/05/2006 - 12/05/2007 Chemotherapy   Rituxan weekly x 4 every 6 months x 2 years     CANCER STAGING: Cancer Staging DLBCL (diffuse large B cell lymphoma) (HCC) Staging form: Lymphoid Neoplasms, AJCC 6th Edition - Clinical: Stage IV - Signed by Baird Cancer, PA on 04/29/2011    INTERVAL HISTORY:  [name] 69 y.o. male returns for routine follow-up for autoimmune hemolytic anemia.  Patient reports he is doing well since he has stopped his prednisone at the end of July. Denies any nausea, vomiting, or diarrhea. Denies any new pains. Had not noticed any recent bleeding such as epistaxis, hematuria or hematochezia. Denies recent chest pain on exertion, shortness of breath on minimal exertion, pre-syncopal episodes, or palpitations. Denies any numbness or tingling in hands or feet. Denies any recent fevers, infections, or recent hospitalizations. Patient reports appetite at 75% and energy level at 80%.  He is eating well maintain his weight this time.     REVIEW OF SYSTEMS:  Review of Systems  Respiratory: Positive for cough.   All other systems reviewed and are negative.    PAST MEDICAL/SURGICAL HISTORY:  Past Medical History:  Diagnosis Date  . Asthma    as child  . Diabetes mellitus without complication ([facility])   . DLBCL (diffuse large B cell lymphoma) ([facility]) 02/28/2009  . Edema   . Heart failure, diastolic, chronic ([facility])    patient denies  . Hyperlipidemia, mixed   . Hypertension   . IDA (iron deficiency anemia)   .  Large cell lymphoma ([facility]) 01/2005   autologous stem cell transplant 12/2005  . Obesity, morbid (more than 100 lbs over ideal weight or BMI > 40) (HCC)   . Venous insufficiency 04/29/2011   Past Surgical History:  Procedure Laterality Date  . BACK SURGERY  2006   T6 vertebrae removed/titanium placed  . CATARACT  EXTRACTION W/PHACO Right 03/31/2019   Procedure: CATARACT EXTRACTION PHACO AND INTRAOCULAR LENS PLACEMENT (IOC);  Surgeon: Baruch Goldmann, MD;  Location: [facility];  Service: Ophthalmology;  Laterality: Right;  CDE: 3.21  . CATARACT EXTRACTION W/PHACO Left 04/14/2019   Procedure: CATARACT EXTRACTION PHACO AND INTRAOCULAR LENS PLACEMENT (IOC);  Surgeon: Baruch Goldmann, MD;  Location: [facility];  Service: Ophthalmology;  Laterality: Left;  left - pt knows to arrive at 7:45, CDE: 3.55  . COLONOSCOPY  11/24/2004   Polyps in the left colon ablated/removed as described above.  Two submucosal lesions consistent with lipomas as described above not  manipulated./ Normal rectum  . COLONOSCOPY  06/20/2012   MULTIPLE RECTAL AND COLONIC POLYPS  . COLONOSCOPY N/A 03/08/2015   RMR: Capacious, redundant colon. Multiple colonic and rectosigmoid polyps removed. ablated as described above. colonic lipoma abnormal appearing terminal ileum likely a variant of normal). Howeverwith history  biopsies obtained.   . ESOPHAGOGASTRODUODENOSCOPY N/A 03/08/2015   RMR: Hiatal hernia Polypoid gastric mucosa with multiple gastric polyps. largest polyp removed via snare polypectomgy hemostasis clip placed at base. Status post gastric biopsy. Status post video capsule placement.   Marland Kitchen GIVENS CAPSULE STUDY N/A 03/08/2015   Procedure: GIVENS CAPSULE STUDY;  Surgeon: Daneil Dolin, MD;  Location: [facility];  Service: Endoscopy;  Laterality: N/A;  . LIMBAL STEM CELL TRANSPLANT    . LUNG BIOPSY  6/06  . MULTIPLE TOOTH EXTRACTIONS  04/2005  . port a cath placement    . PORT-A-CATH REMOVAL  09/08/2012   Procedure: REMOVAL PORT-A-CATH;  Surgeon: Melrose Nakayama, MD;  Location: [facility];  Service: Thoracic;  Laterality: N/A;     SOCIAL HISTORY:  Social History   Socioeconomic History  . Marital status: Married    Spouse name: Not on file  . Number of children: 2  . Years of education: Not on file  . Highest education level: Not on file    Occupational History  . Occupation: disability due to back    Employer: UNEMPLOYED  Tobacco Use  . Smoking status: Former Smoker    Packs/day: 0.50    Years: 15.00    Pack years: 7.50    Quit date: 11/11/2002    Years since quitting: 17.5  . Smokeless tobacco: Never Used  Vaping Use  . Vaping Use: Never used  Substance and Sexual Activity  . Alcohol use: No    Alcohol/week: 0.0 standard drinks  . Drug use: No  . Sexual activity: Yes    Birth control/protection: None  Other Topics Concern  . Not on file  Social History Narrative   Married   No regular exercise   Social Determinants of Health   Financial Resource Strain:   . Difficulty of Paying Living Expenses: Not on file  Food Insecurity:   . Worried About Charity fundraiser in the Last Year: Not on file  . Ran Out of Food in the Last Year: Not on file  Transportation Needs:   . Lack of Transportation (Medical): Not on file  . Lack of Transportation (Non-Medical): Not on file  Physical Activity:   . Days of Exercise per Week:  Not on file  . Minutes of Exercise per Session: Not on file  Stress:   . Feeling of Stress : Not on file  Social Connections:   . Frequency of Communication with Friends and Family: Not on file  . Frequency of Social Gatherings with Friends and Family: Not on file  . Attends Religious Services: Not on file  . Active Member of Clubs or Organizations: Not on file  . Attends Archivist Meetings: Not on file  . Marital Status: Not on file  Intimate Partner Violence:   . Fear of Current or Ex-Partner: Not on file  . Emotionally Abused: Not on file  . Physically Abused: Not on file  . Sexually Abused: Not on file    FAMILY HISTORY:  Family History  Problem Relation Age of Onset  . Cancer Mother        lung  . Hypertension Mother   . Hyperlipidemia Mother   . Cancer Father        prostate  . Hypertension Father   . Colon cancer Neg Hx   . Diabetes Neg Hx     CURRENT  MEDICATIONS:  Outpatient Encounter Medications as of 05/23/2020  Medication Sig  . apixaban (ELIQUIS) 5 MG TABS tablet Take 1 tablet (5 mg total) by mouth 2 (two) times daily.  . Blood Glucose Monitoring Suppl (ONE TOUCH ULTRA 2) w/Device KIT USE TO TEST BLOOD SUGAR ONCE D UTD  . clotrimazole-betamethasone (LOTRISONE) cream Apply 1 application topically 2 (two) times daily as needed (leg infection).   Marland Kitchen ergocalciferol (VITAMIN D2) 1.25 MG (50000 UT) capsule Take 1 capsule (50,000 Units total) by mouth once a week.  . folic acid (FOLVITE) 1 MG tablet TAKE 1 TABLET(1 MG) BY MOUTH DAILY  . furosemide (LASIX) 20 MG tablet Take 20 mg by mouth daily as needed for fluid or edema.   . insulin degludec (TRESIBA FLEXTOUCH) 100 UNIT/ML FlexTouch Pen Inject 0.35 mLs (35 Units total) into the skin at bedtime.  . Insulin Pen Needle (B-D ULTRAFINE III SHORT PEN) 31G X 8 MM MISC 1 each by Does not apply route daily. Use once a day as directed  . Lancets (ONETOUCH DELICA PLUS [id number]) MISC Use to check blood glucose twice daily, before breakfast and before bed  . metFORMIN (GLUCOPHAGE) 500 MG tablet TAKE 1 TABLET(500 MG) BY MOUTH TWICE DAILY WITH A MEAL  . ONETOUCH ULTRA test strip USE TO TEST BLOOD SUGAR 2 TIMES A DAY  . rosuvastatin (CRESTOR) 40 MG tablet Take 40 mg by mouth daily.  . vitamin B-12 (CYANOCOBALAMIN) 1000 MCG tablet Take 2,000 mcg by mouth daily.  . metoprolol tartrate (LOPRESSOR) 25 MG tablet Take 1 tablet (25 mg total) by mouth 2 (two) times daily.   No facility-administered encounter medications on file as of 05/23/2020.    ALLERGIES:  Allergies  Allergen Reactions  . Penicillins Rash    Has patient had a PCN reaction causing immediate rash, facial/tongue/throat swelling, SOB or lightheadedness with hypotension: Unknown Has patient had a PCN reaction causing severe rash involving mucus membranes or skin necrosis: Unknown Has patient had a PCN reaction that required hospitalization:  Unknown Has patient had a PCN reaction occurring within the last 10 years: No If all of the above answers are "NO", then may proceed with Cephalosporin use.      PHYSICAL EXAM:  ECOG Performance status: 1  Vitals:   05/23/20 0942  BP: (!) 136/57  Pulse: 78  Resp: 20  Temp: Marland Kitchen)  96.9 F (36.1 C)  SpO2: 99%   Filed Weights   05/23/20 0942  Weight: 266 lb 5.1 oz (120.8 kg)   Physical Exam Constitutional:      Appearance: Normal appearance. He is normal weight.  Cardiovascular:     Rate and Rhythm: Normal rate and regular rhythm.     Heart sounds: Normal heart sounds.  Pulmonary:     Effort: Pulmonary effort is normal.     Breath sounds: Normal breath sounds.  Abdominal:     General: Bowel sounds are normal.     Palpations: Abdomen is soft.  Musculoskeletal:        General: Normal range of motion.  Skin:    General: Skin is warm.  Neurological:     Mental Status: He is alert and oriented to person, place, and time. Mental status is at baseline.  Psychiatric:        Mood and Affect: Mood normal.        Behavior: Behavior normal.        Thought Content: Thought content normal.        Judgment: Judgment normal.      LABORATORY DATA:  I have reviewed the labs as listed.  CBC    Component Value Date/Time   WBC 3.8 (L) 05/23/2020 0925   RBC 3.93 (L) 05/23/2020 0925   RBC 4.00 (L) 05/23/2020 0925   HGB 12.7 (L) 05/23/2020 0925   HCT 38.6 (L) 05/23/2020 0925   PLT 135 (L) 05/23/2020 0925   MCV 98.2 05/23/2020 0925   MCH 32.3 05/23/2020 0925   MCHC 32.9 05/23/2020 0925   RDW 13.9 05/23/2020 0925   LYMPHSABS 0.5 (L) 05/23/2020 0925   MONOABS 0.5 05/23/2020 0925   EOSABS 0.3 05/23/2020 0925   BASOSABS 0.0 05/23/2020 0925   CMP Latest Ref Rng & Units 05/23/2020 05/01/2020 04/16/2020  Glucose 70 - 99 mg/dL 98 100(H) 129(H)  BUN 8 - 23 mg/dL 15 11 8   Creatinine 0.61 - 1.24 mg/dL 1.21 1.11 1.04  Sodium 135 - 145 mmol/L 140 142 141  Potassium 3.5 - 5.1 mmol/L 3.7 3.6  3.1(L)  Chloride 98 - 111 mmol/L 105 105 106  CO2 22 - 32 mmol/L 28 27 26   Calcium 8.9 - 10.3 mg/dL 9.0 9.1 8.5(L)  Total Protein 6.5 - 8.1 g/dL 6.2(L) 6.5 6.0(L)  Total Bilirubin 0.3 - 1.2 mg/dL 0.5 0.8 0.5  Alkaline Phos 38 - 126 U/L 65 67 66  AST 15 - 41 U/L 18 17 19   ALT 0 - 44 U/L 14 12 18     All questions were answered to patient's stated satisfaction. Encouraged patient to call with any new concerns or questions before his next visit to the cancer center and we can certain see him sooner, if needed.     ASSESSMENT & PLAN:  Autoimmune hemolytic anemia due to IgG 1.  Autoimmune hemolytic anemia: -Prednisone 120 mg daily started on 08/05/2019, rituximab 4 doses from 08/15/2019 through 09/05/2019. -Prednisone was tapered off very slowly. -We gave 2 IV iron infusions on 01/05/2020 and 01/12/2020 due to his ferritin being 92, percent saturation 18. -He has been off of his prednisone since the end of July 2021 -Labs done on 05/23/2020 showed hemoglobin 12.7, platelets 135, reticulocytes 2.2, bilirubin 0.5, LDH 144 -We will see him back in 4 weeks.  2.  Leg swelling: -He will use Lasix 20 mg daily as needed.  3.  Poorly controlled diabetes: -Glucose today is 110.  He is taking  Amaryl 2 mg twice daily. -Patient reports it is hard to control his diabetes with the prednisone. -Hemoglobin A1c 8.2  4.  DLBCL: -Diagnosed in 2006, treated with R-CHOP with complete response on PET scan. -Relapse of lymphoma on 08/31/2005, treated with salvage chemotherapy followed by stem cell transplant on 01/15/2006. -Rituximab weekly x4 followed by every 6 months for 2 years completed on 12/05/2007. -PET scan on 08/14/2019 did not show any evidence or recurrence of lymphoma. -No B symptoms at this time.     Orders placed this encounter:  Orders Placed This Encounter  Procedures  . CBC with Differential/Platelet  . Comprehensive metabolic panel  . Ferritin  . Iron and TIBC  . Lactate dehydrogenase       Francene Finders, FNP-C [facility] [phone]

## 2020-05-24 ENCOUNTER — Ambulatory Visit (INDEPENDENT_AMBULATORY_CARE_PROVIDER_SITE_OTHER): Payer: Medicare Other | Admitting: Podiatry

## 2020-05-24 ENCOUNTER — Encounter: Payer: Self-pay | Admitting: Podiatry

## 2020-05-24 DIAGNOSIS — L84 Corns and callosities: Secondary | ICD-10-CM

## 2020-05-24 DIAGNOSIS — B351 Tinea unguium: Secondary | ICD-10-CM | POA: Diagnosis not present

## 2020-05-24 DIAGNOSIS — M79675 Pain in left toe(s): Secondary | ICD-10-CM

## 2020-05-24 DIAGNOSIS — E1165 Type 2 diabetes mellitus with hyperglycemia: Secondary | ICD-10-CM

## 2020-05-24 DIAGNOSIS — I890 Lymphedema, not elsewhere classified: Secondary | ICD-10-CM

## 2020-05-24 DIAGNOSIS — M79674 Pain in right toe(s): Secondary | ICD-10-CM

## 2020-05-24 NOTE — Patient Instructions (Signed)
Spray Lotrimin Spray Powder between toes once daily.  Spray inside of shoes with Lysol at the end of the day.  WEEKLY FOOT SOAK INSTRUCTIONS FOR FOOT HYGIENE   1. SOAK FEET IN LUKEWARM SOAPY WATER FOR 10 MINUTES. HAVE A FAMILY MEMBER OR CAREGIVER CHECK THE WATER TEMPERATURE FOR YOU BEFORE SUBMERGING YOUR FEET IN THE WATER.  2.  DRY FEET WELL TAKING CARE TO DRY WELL BETWEEN TOES AND UNDER TOES.  3.  APPLY MOISTURIZING CREAM TO FEET AVOIDING APPLICATION BETWEEN TOES

## 2020-05-27 ENCOUNTER — Other Ambulatory Visit: Payer: Self-pay

## 2020-05-27 ENCOUNTER — Ambulatory Visit (INDEPENDENT_AMBULATORY_CARE_PROVIDER_SITE_OTHER): Payer: Medicare Other | Admitting: Physician Assistant

## 2020-05-27 ENCOUNTER — Ambulatory Visit (HOSPITAL_COMMUNITY)
Admission: RE | Admit: 2020-05-27 | Discharge: 2020-05-27 | Disposition: A | Discharge status: Home / Self Care | Payer: Medicare Other | Source: Ambulatory Visit | Attending: Surgery | Admitting: Surgery

## 2020-05-27 VITALS — BP 117/74 | HR 71 | Temp 98.3°F | Resp 20 | Ht 71.0 in | Wt 266.0 lb

## 2020-05-27 DIAGNOSIS — I872 Venous insufficiency (chronic) (peripheral): Secondary | ICD-10-CM | POA: Diagnosis not present

## 2020-05-27 NOTE — Progress Notes (Signed)
[facility] [place]   Reason for referral: ChronicSwollen B LE leg  History of Present Illness  [name] is a 69 y.o. male who presents with chief complaint: swollen legs.  Patient notes, onset of swelling since 2012 months ago, associated with prolonged sitting and standing.  The patient has had no history of DVT, no history of varicose vein, no history of venous stasis ulcers, no history of  Lymphedema and positive history of skin changes in lower legs with episodes of weeping clear fluid.  There is a family history of venous disorders.  The patient has not used compression stockings in the past few years.  He denise symptoms of claudication and non healing wounds on his feet.  He has not been as active recently since he has moved from a house to an apartment.  He does try and elevate his legs throughout the day.  His Hematologist has started him on Lasix   20 mg daily PRN.  He has known  Autoimmune hemolytic anemia that requires periodic Prednisone treatments.  Poorly controlled diabetes with A1c of 8.2, Hx of diffuse large cell lymphoma s/p treatment without recurrence, chronic heart failure, history of venous insufficieny dating back to 2012, HTN and hyperlipidemia.    Past Medical History:  Diagnosis Date  . Asthma    as child  . Diabetes mellitus without complication ([place])   . DLBCL (diffuse large B cell lymphoma) ([place]) 02/28/2009  . Edema   . Heart failure, diastolic, chronic ([place])    patient denies  . Hyperlipidemia, mixed   . Hypertension   . IDA (iron deficiency anemia)   . Large cell lymphoma ([place]) 01/2005   autologous stem cell transplant 12/2005  . Obesity, morbid (more than 100 lbs over ideal weight or BMI > 40) ([facility])   . Venous insufficiency 04/29/2011    Past Surgical History:  Procedure Laterality Date  . BACK SURGERY  2006   T6 vertebrae removed/titanium placed  . CATARACT EXTRACTION W/PHACO Right 03/31/2019   Procedure: CATARACT EXTRACTION PHACO  AND INTRAOCULAR LENS PLACEMENT (IOC);  Surgeon: Baruch Goldmann, MD;  Location: [facility];  Service: Ophthalmology;  Laterality: Right;  CDE: 3.21  . CATARACT EXTRACTION W/PHACO Left 04/14/2019   Procedure: CATARACT EXTRACTION PHACO AND INTRAOCULAR LENS PLACEMENT (IOC);  Surgeon: Baruch Goldmann, MD;  Location: [facility];  Service: Ophthalmology;  Laterality: Left;  left - pt knows to arrive at 7:45, CDE: 3.55  . COLONOSCOPY  11/24/2004   Polyps in the left colon ablated/removed as described above.  Two submucosal lesions consistent with lipomas as described above not  manipulated./ Normal rectum  . COLONOSCOPY  06/20/2012   MULTIPLE RECTAL AND COLONIC POLYPS  . COLONOSCOPY N/A 03/08/2015   RMR: Capacious, redundant colon. Multiple colonic and rectosigmoid polyps removed. ablated as described above. colonic lipoma abnormal appearing terminal ileum likely a variant of normal). Howeverwith history  biopsies obtained.   . ESOPHAGOGASTRODUODENOSCOPY N/A 03/08/2015   RMR: Hiatal hernia Polypoid gastric mucosa with multiple gastric polyps. largest polyp removed via snare polypectomgy hemostasis clip placed at base. Status post gastric biopsy. Status post video capsule placement.   Marland Kitchen GIVENS CAPSULE STUDY N/A 03/08/2015   Procedure: GIVENS CAPSULE STUDY;  Surgeon: Daneil Dolin, MD;  Location: [facility];  Service: Endoscopy;  Laterality: N/A;  . LIMBAL STEM CELL TRANSPLANT    . LUNG BIOPSY  6/06  . MULTIPLE TOOTH EXTRACTIONS  04/2005  . port a cath placement    .  PORT-A-CATH REMOVAL  09/08/2012   Procedure: REMOVAL PORT-A-CATH;  Surgeon: Melrose Nakayama, MD;  Location: [facility] OR;  Service: Thoracic;  Laterality: N/A;    Social History   Socioeconomic History  . Marital status: Married    Spouse name: Not on file  . Number of children: 2  . Years of education: Not on file  . Highest education level: Not on file  Occupational History  . Occupation: disability due to back    Employer: UNEMPLOYED    Tobacco Use  . Smoking status: Former Smoker    Packs/day: 0.50    Years: 15.00    Pack years: 7.50    Quit date: 11/11/2002    Years since quitting: 17.5  . Smokeless tobacco: Never Used  Vaping Use  . Vaping Use: Never used  Substance and Sexual Activity  . Alcohol use: No    Alcohol/week: 0.0 standard drinks  . Drug use: No  . Sexual activity: Yes    Birth control/protection: None  Other Topics Concern  . Not on file  Social History Narrative   Married   No regular exercise   Social Determinants of Health   Financial Resource Strain:   . Difficulty of Paying Living Expenses: Not on file  Food Insecurity:   . Worried About Charity fundraiser in the Last Year: Not on file  . Ran Out of Food in the Last Year: Not on file  Transportation Needs:   . Lack of Transportation (Medical): Not on file  . Lack of Transportation (Non-Medical): Not on file  Physical Activity:   . Days of Exercise per Week: Not on file  . Minutes of Exercise per Session: Not on file  Stress:   . Feeling of Stress : Not on file  Social Connections:   . Frequency of Communication with Friends and Family: Not on file  . Frequency of Social Gatherings with Friends and Family: Not on file  . Attends Religious Services: Not on file  . Active Member of Clubs or Organizations: Not on file  . Attends Archivist Meetings: Not on file  . Marital Status: Not on file  Intimate Partner Violence:   . Fear of Current or Ex-Partner: Not on file  . Emotionally Abused: Not on file  . Physically Abused: Not on file  . Sexually Abused: Not on file    Family History  Problem Relation Age of Onset  . Cancer Mother        lung  . Hypertension Mother   . Hyperlipidemia Mother   . Cancer Father        prostate  . Hypertension Father   . Colon cancer Neg Hx   . Diabetes Neg Hx     Current Outpatient Medications on File Prior to Visit  Medication Sig Dispense Refill  . apixaban (ELIQUIS) 5 MG  TABS tablet Take 1 tablet (5 mg total) by mouth 2 (two) times daily. 60 tablet 11  . Blood Glucose Monitoring Suppl (ONE TOUCH ULTRA 2) w/Device KIT USE TO TEST BLOOD SUGAR ONCE D UTD    . clotrimazole-betamethasone (LOTRISONE) cream Apply 1 application topically 2 (two) times daily as needed (leg infection).     Marland Kitchen ergocalciferol (VITAMIN D2) 1.25 MG (50000 UT) capsule Take 1 capsule (50,000 Units total) by mouth once a week. 16 capsule 3  . folic acid (FOLVITE) 1 MG tablet TAKE 1 TABLET(1 MG) BY MOUTH DAILY 30 tablet 4  . furosemide (LASIX) 20 MG tablet  Take 20 mg by mouth daily as needed for fluid or edema.   0  . insulin degludec (TRESIBA FLEXTOUCH) 100 UNIT/ML FlexTouch Pen Inject 0.35 mLs (35 Units total) into the skin at bedtime. 30 mL 3  . Insulin Pen Needle (B-D ULTRAFINE III SHORT PEN) 31G X 8 MM MISC 1 each by Does not apply route daily. Use once a day as directed 100 each 3  . Lancets (ONETOUCH DELICA PLUS [id number]) MISC Use to check blood glucose twice daily, before breakfast and before bed 300 each 3  . metFORMIN (GLUCOPHAGE) 500 MG tablet TAKE 1 TABLET(500 MG) BY MOUTH TWICE DAILY WITH A MEAL 180 tablet 0  . ONETOUCH ULTRA test strip USE TO TEST BLOOD SUGAR 2 TIMES A DAY 100 strip 3  . rosuvastatin (CRESTOR) 40 MG tablet Take 40 mg by mouth daily.    . vitamin B-12 (CYANOCOBALAMIN) 1000 MCG tablet Take 2,000 mcg by mouth daily.    . metoprolol tartrate (LOPRESSOR) 25 MG tablet Take 1 tablet (25 mg total) by mouth 2 (two) times daily. 180 tablet 3   No current facility-administered medications on file prior to visit.    Allergies as of 05/27/2020 - Review Complete 05/27/2020  Allergen Reaction Noted  . Penicillins Rash 04/29/2011     ROS:   General:  No weight loss, Fever, chills  HEENT: No recent headaches, no nasal bleeding, no visual changes, no sore throat  Neurologic: No dizziness, blackouts, seizures. No recent symptoms of stroke or mini- stroke. No recent episodes  of slurred speech, or temporary blindness.  Cardiac: No recent episodes of chest pain/pressure, no shortness of breath at rest.  No shortness of breath with exertion.  Denies history of atrial fibrillation or irregular heartbeat  Vascular: No history of rest pain in feet.  No history of claudication.  No history of non-healing ulcer, No history of DVT   Pulmonary: No home oxygen, no productive cough, no hemoptysis,  No asthma or wheezing  Musculoskeletal:  [ ] Arthritis, [ ] Low back pain,  [ ] Joint pain  Hematologic:No history of hypercoagulable state.  No history of easy bleeding.  positive history of anemia  Gastrointestinal: No hematochezia or melena,  No gastroesophageal reflux, no trouble swallowing  Urinary: [ ] chronic Kidney disease, [ ] on HD - [ ] MWF or [ ] TTHS, [ ] Burning with urination, [ ] Frequent urination, [ ] Difficulty urinating;   Skin: No rashes  Psychological: No history of anxiety,  No history of depression  Physical Examination  Vitals:   05/27/20 1054  BP: 117/74  Pulse: 71  Resp: 20  Temp: 98.3 F (36.8 C)  TempSrc: Temporal  SpO2: 99%  Weight: 266 lb (120.7 kg)  Height: 5' 11" (1.803 m)    Body mass index is 37.1 kg/m.  General:  Alert and oriented, no acute distress HEENT: Normal Neck: No bruit or JVD Pulmonary: Clear to auscultation bilaterally Cardiac: Regular Rate and Rhythm without murmur Abdomen: Soft, non-tender, non-distended, no mass, no scars Skin: No rash Extremity Pulses:  2+ radial, brachial, femoral, dorsalis pedis, pulses bilaterally Musculoskeletal: No deformity or edema  Neurologic: Upper and lower extremity motor  symmetric        DATA: Venous Reflux Times  +--------------+---------+------+----------+-----------+-------------------  ---+  RIGHT     Reflux NoReflux Reflux  Diameter Comments                       Yes  Time    cms                 +--------------+---------+------+----------+-----------+-------------------  ---+  CFV            yes >1 second                     +--------------+---------+------+----------+-----------+-------------------  ---+  FV prox          yes >1 second                     +--------------+---------+------+----------+-----------+-------------------  ---+  FV mid          yes >1 second                     +--------------+---------+------+----------+-----------+-------------------  ---+  FV dist          yes >1 second                     +--------------+---------+------+----------+-----------+-------------------  ---+  Popliteal         yes >1 second                     +--------------+---------+------+----------+-----------+-------------------  ---+  GSV at [facility]        yes  >500 ms   0.493                +--------------+---------+------+----------+-----------+-------------------  ---+  GSV prox thigh      yes  >500 ms   0.523                +--------------+---------+------+----------+-----------+-------------------  ---+  GSV mid thigh no              0.564                +--------------+---------+------+----------+-----------+-------------------  ---+  GSV dist thigh      yes  >500 ms   0.557  Focal incompetent                                valve            +--------------+---------+------+----------+-----------+-------------------  ---+  GSV at knee  no              0.429                +--------------+---------+------+----------+-----------+-------------------  ---+  GSV prox calf                0.512                 +--------------+---------+------+----------+-----------+-------------------  ---+  GSV mid calf                0.500                +--------------+---------+------+----------+-----------+-------------------  ---+  SSV Pop Fossa       yes  >500 ms   0.615                +--------------+---------+------+----------+-----------+-------------------  ---+  SSV prox calf       yes  >500 ms   0.536                +--------------+---------+------+----------+-----------+-------------------  ---+  SSV mid calf       yes  >500 ms   0.531                +--------------+---------+------+----------+-----------+-------------------  ---+     +--------------+---------+------+-----------+------------+-----------------  ----+  LEFT     Reflux NoRefluxReflux TimeDiameter cmsComments                      Yes                          +--------------+---------+------+-----------+------------+-----------------  ----+  CFV            yes  >1 second                    +--------------+---------+------+-----------+------------+-----------------  ----+  FV prox          yes  >1 second                    +--------------+---------+------+-----------+------------+-----------------  ----+  FV mid          yes  >1 second                    +--------------+---------+------+-----------+------------+-----------------  ----+  FV dist          yes  >1 second                    +--------------+---------+------+-----------+------------+-----------------  ----+  Popliteal         yes  >1 second                     +--------------+---------+------+-----------+------------+-----------------  ----+  GSV at [facility]  no               0.412               +--------------+---------+------+-----------+------------+-----------------  ----+  GSV prox thighno               0.486               +--------------+---------+------+-----------+------------+-----------------  ----+  GSV mid thigh no               0.426               +--------------+---------+------+-----------+------------+-----------------  ----+  GSV dist thigh      yes  >500 ms   0.492               +--------------+---------+------+-----------+------------+-----------------  ----+  GSV at knee        yes  >500 ms   0.294               +--------------+---------+------+-----------+------------+-----------------  ----+  GSV prox calf                 0.452               +--------------+---------+------+-----------+------------+-----------------  ----+  GSV mid calf                 0.188               +--------------+---------+------+-----------+------------+-----------------  ----+  SSV Pop Fossa       yes  >500 ms   0.729               +--------------+---------+------+-----------+------------+-----------------  ----+  SSV prox calf       yes  >500 ms   0.514               +--------------+---------+------+-----------+------------+-----------------  ----+  SSV mid calf                     Unable to  visualize  secondary to                                  scleroderma         +--------------+---------+------+-----------+------------+-----------------  ----+     Assessment: Chronic Venous reflux in both deep and GSV superficial veins with chronic B LE skin changes.  Significant history of weeping episodes that soak his shoes and socks.  His study is negative for DVT.  Plan: He was fitted for thigh high compression to be worn daily.  He will increase his activity, elevation of B LE when at rest.  I will schedule him for f/u in our vein clinic to discuss venous intervention such as laser ablation of the GSV.  He is on Eliquis with a history of atrial flutter.    Roxy Horseman PA-C [facility] [place] Office: [phone]  MD in clinic [place]

## 2020-05-28 ENCOUNTER — Encounter: Payer: Self-pay | Admitting: Podiatry

## 2020-05-28 NOTE — Progress Notes (Signed)
Subjective:  Patient ID: [name], male    DOB: [birth year],  MRN: [id number]  69 y.o. male presents with preventative diabetic foot care and painful thick toenails that are difficult to trim. Pain interferes with ambulation. Aggravating factors include wearing enclosed shoe gear. Pain is relieved with periodic professional debridement..    Patient's blood sugar was 96 mg/dl this morning.  PCP: Redmond School, MD and last visit was: 01/22/2020.  Review of Systems: Negative except as noted in the HPI.  Past Medical History:  Diagnosis Date  . Asthma    as child  . Diabetes mellitus without complication ([place])   . DLBCL (diffuse large B cell lymphoma) ([place]) 02/28/2009  . Edema   . Heart failure, diastolic, chronic ([place])    patient denies  . Hyperlipidemia, mixed   . Hypertension   . IDA (iron deficiency anemia)   . Large cell lymphoma ([place]) 01/2005   autologous stem cell transplant 12/2005  . Obesity, morbid (more than 100 lbs over ideal weight or BMI > 40) ([facility])   . Venous insufficiency 04/29/2011   Past Surgical History:  Procedure Laterality Date  . BACK SURGERY  2006   T6 vertebrae removed/titanium placed  . CATARACT EXTRACTION W/PHACO Right 03/31/2019   Procedure: CATARACT EXTRACTION PHACO AND INTRAOCULAR LENS PLACEMENT (IOC);  Surgeon: Baruch Goldmann, MD;  Location: [facility];  Service: Ophthalmology;  Laterality: Right;  CDE: 3.21  . CATARACT EXTRACTION W/PHACO Left 04/14/2019   Procedure: CATARACT EXTRACTION PHACO AND INTRAOCULAR LENS PLACEMENT (IOC);  Surgeon: Baruch Goldmann, MD;  Location: [facility];  Service: Ophthalmology;  Laterality: Left;  left - pt knows to arrive at 7:45, CDE: 3.55  . COLONOSCOPY  11/24/2004   Polyps in the left colon ablated/removed as described above.  Two submucosal lesions consistent with lipomas as described above not  manipulated./ Normal rectum  . COLONOSCOPY  06/20/2012   MULTIPLE RECTAL AND COLONIC POLYPS  . COLONOSCOPY N/A 03/08/2015   RMR:  Capacious, redundant colon. Multiple colonic and rectosigmoid polyps removed. ablated as described above. colonic lipoma abnormal appearing terminal ileum likely a variant of normal). Howeverwith history  biopsies obtained.   . ESOPHAGOGASTRODUODENOSCOPY N/A 03/08/2015   RMR: Hiatal hernia Polypoid gastric mucosa with multiple gastric polyps. largest polyp removed via snare polypectomgy hemostasis clip placed at base. Status post gastric biopsy. Status post video capsule placement.   Marland Kitchen GIVENS CAPSULE STUDY N/A 03/08/2015   Procedure: GIVENS CAPSULE STUDY;  Surgeon: Daneil Dolin, MD;  [facility];  Service: Endoscopy;  Laterality: N/A;  . LIMBAL STEM CELL TRANSPLANT    . LUNG BIOPSY  6/06  . MULTIPLE TOOTH EXTRACTIONS  04/2005  . port a cath placement    . PORT-A-CATH REMOVAL  09/08/2012   Procedure: REMOVAL PORT-A-CATH;  Surgeon: Melrose Nakayama, MD;  Location: [facility];  Service: Thoracic;  Laterality: N/A;   Patient Active Problem List   Diagnosis Date Noted  . Vitamin D deficiency 01/25/2020  . Uncontrolled type 2 diabetes mellitus with hyperglycemia ([place]) 12/14/2019  . Cellulitis of lower extremity 12/14/2019  . Autoimmune hemolytic anemia due to IgG ([place])   . Symptomatic anemia   . Anemia 08/02/2019  . Macrocytic anemia 08/02/2019  . Atrial flutter ([place]) 09/07/2017  . Bilateral pneumonia 09/07/2017  . Acute respiratory failure with hypoxia ([place]) 05/19/2017  . AKI (acute kidney injury) ([place])   . Sepsis due to pneumonia ([place]) 05/18/2017  . Type 2 diabetes mellitus with complication, without long-term current use  of insulin ([place]) 02/19/2017  . Essential hypertension, benign 04/03/2015  . IDA (iron deficiency anemia)   . Mucosal abnormality of stomach   . Hx of adenomatous colonic polyps 06/13/2012  . Status post autologous bone marrow transplantation ([place]) 07/15/2011  . Venous insufficiency 04/29/2011  . DLBCL (diffuse large B cell lymphoma) ([place]) 02/28/2009  . Mixed  hyperlipidemia 02/28/2009  . Class 2 severe obesity due to excess calories with serious comorbidity and body mass index (BMI) of 36.0 to 36.9 in adult ([place]) 02/28/2009  . DIASTOLIC HEART FAILURE, CHRONIC 02/28/2009  . EDEMA 02/28/2009    Current Outpatient Medications:  .  apixaban (ELIQUIS) 5 MG TABS tablet, Take 1 tablet (5 mg total) by mouth 2 (two) times daily., Disp: 60 tablet, Rfl: 11 .  Blood Glucose Monitoring Suppl (ONE TOUCH ULTRA 2) w/Device KIT, USE TO TEST BLOOD SUGAR ONCE D UTD, Disp: , Rfl:  .  clotrimazole-betamethasone (LOTRISONE) cream, Apply 1 application topically 2 (two) times daily as needed (leg infection). , Disp: , Rfl:  .  ergocalciferol (VITAMIN D2) 1.25 MG (50000 UT) capsule, Take 1 capsule (50,000 Units total) by mouth once a week., Disp: 16 capsule, Rfl: 3 .  folic acid (FOLVITE) 1 MG tablet, TAKE 1 TABLET(1 MG) BY MOUTH DAILY, Disp: 30 tablet, Rfl: 4 .  furosemide (LASIX) 20 MG tablet, Take 20 mg by mouth daily as needed for fluid or edema. , Disp: , Rfl: 0 .  insulin degludec (TRESIBA FLEXTOUCH) 100 UNIT/ML FlexTouch Pen, Inject 0.35 mLs (35 Units total) into the skin at bedtime., Disp: 30 mL, Rfl: 3 .  Insulin Pen Needle (B-D ULTRAFINE III SHORT PEN) 31G X 8 MM MISC, 1 each by Does not apply route daily. Use once a day as directed, Disp: 100 each, Rfl: 3 .  Lancets (ONETOUCH DELICA PLUS [id number]) MISC, Use to check blood glucose twice daily, before breakfast and before bed, Disp: 300 each, Rfl: 3 .  metFORMIN (GLUCOPHAGE) 500 MG tablet, TAKE 1 TABLET(500 MG) BY MOUTH TWICE DAILY WITH A MEAL, Disp: 180 tablet, Rfl: 0 .  metoprolol tartrate (LOPRESSOR) 25 MG tablet, Take 1 tablet (25 mg total) by mouth 2 (two) times daily., Disp: 180 tablet, Rfl: 3 .  ONETOUCH ULTRA test strip, USE TO TEST BLOOD SUGAR 2 TIMES A DAY, Disp: 100 strip, Rfl: 3 .  rosuvastatin (CRESTOR) 40 MG tablet, Take 40 mg by mouth daily., Disp: , Rfl:  .  vitamin B-12 (CYANOCOBALAMIN) 1000 MCG  tablet, Take 2,000 mcg by mouth daily., Disp: , Rfl:  Allergies  Allergen Reactions  . Penicillins Rash    Has patient had a PCN reaction causing immediate rash, facial/tongue/throat swelling, SOB or lightheadedness with hypotension: Unknown Has patient had a PCN reaction causing severe rash involving mucus membranes or skin necrosis: Unknown Has patient had a PCN reaction that required hospitalization: Unknown Has patient had a PCN reaction occurring within the last 10 years: No If all of the above answers are "NO", then may proceed with Cephalosporin use.    Social History   Tobacco Use  Smoking Status Former Smoker  . Packs/day: 0.50  . Years: 15.00  . Pack years: 7.50  . Quit date: 11/11/2002  . Years since quitting: 17.5  Smokeless Tobacco Never Used   Objective:  There were no vitals filed for this visit. Constitutional Patient is a pleasant 69 y.o. African American male morbidly obese in NAD.Marland Kitchen AAO x 3.  Vascular Capillary refill time to digits immediate b/l. Palpable  pedal pulses b/l LE. Pedal hair present. Lower extremity skin temperature gradient within normal limits. No pain with calf compression b/l. Lymphedema present b/l lower extremities. No cyanosis or clubbing noted.  Neurologic Normal speech. Oriented to person, place, and time. Protective sensation intact 5/5 intact bilaterally with 10g monofilament b/l. Vibratory sensation intact b/l.  Dermatologic No open wounds bilaterally. No interdigital macerations bilaterally. Toenails 1-5 b/l elongated, discolored, dystrophic, thickened, crumbly with subungual debris and tenderness to dorsal palpation. Pedal skin with noted brawny edema b/l. Hyperkeratotic lesion(s) L hallux.  No erythema, no edema, no drainage, no flocculence.  Orthopedic: Normal muscle strength 5/5 to all lower extremity muscle groups bilaterally. No pain crepitus or joint limitation noted with ROM b/l. No gross bony deformities bilaterally. Pes planus  deformity noted b/l.    Hemoglobin A1C Latest Ref Rng & Units 02/12/2020 11/21/2019  HGBA1C 4.8 - 5.6 % 8.2(H) >15.5(H)  Some recent data might be hidden   Assessment:   1. Pain due to onychomycosis of toenails of both feet   2. Callus   3. Lymphedema of both lower extremities   4. Uncontrolled type 2 diabetes mellitus with hyperglycemia ([place])    Plan:  Patient was evaluated and treated and all questions answered.  Onychomycosis with pain -Nails palliatively debridement as below. -Educated on self-care  Procedure: Nail Debridement Rationale: Pain Type of Debridement: manual, sharp debridement. Instrumentation: Nail nipper, rotary burr. Number of Nails: 10  -Examined patient. -No new findings. No new orders. -Continue diabetic foot care principles. -Toenails 1-5 b/l were debrided in length and girth with sterile nail nippers and dremel without iatrogenic bleeding.  -Callus(es) L hallux pared utilizing sterile scalpel blade without complication or incident. Total number debrided =1. -Patient to report any pedal injuries to medical professional immediately. -Patient to continue soft, supportive shoe gear daily. -Patient/POA to call should there be question/concern in the interim.  Return in about 3 months (around 08/24/2020).  Marzetta Board, DPM

## 2020-06-10 DIAGNOSIS — M7989 Other specified soft tissue disorders: Secondary | ICD-10-CM

## 2020-06-21 ENCOUNTER — Other Ambulatory Visit (HOSPITAL_COMMUNITY): Payer: Self-pay

## 2020-06-21 DIAGNOSIS — D696 Thrombocytopenia, unspecified: Secondary | ICD-10-CM

## 2020-06-21 DIAGNOSIS — D6489 Other specified anemias: Secondary | ICD-10-CM

## 2020-06-21 DIAGNOSIS — E559 Vitamin D deficiency, unspecified: Secondary | ICD-10-CM

## 2020-06-21 DIAGNOSIS — D5919 Other autoimmune hemolytic anemia: Secondary | ICD-10-CM

## 2020-06-21 DIAGNOSIS — C8330 Diffuse large B-cell lymphoma, unspecified site: Secondary | ICD-10-CM

## 2020-06-22 DIAGNOSIS — E7849 Other hyperlipidemia: Secondary | ICD-10-CM | POA: Diagnosis not present

## 2020-06-22 DIAGNOSIS — I2510 Atherosclerotic heart disease of native coronary artery without angina pectoris: Secondary | ICD-10-CM | POA: Diagnosis not present

## 2020-06-22 DIAGNOSIS — E1165 Type 2 diabetes mellitus with hyperglycemia: Secondary | ICD-10-CM | POA: Diagnosis not present

## 2020-06-22 DIAGNOSIS — Z720 Tobacco use: Secondary | ICD-10-CM | POA: Diagnosis not present

## 2020-06-24 ENCOUNTER — Inpatient Hospital Stay (HOSPITAL_COMMUNITY): Payer: Medicare Other

## 2020-06-24 ENCOUNTER — Ambulatory Visit (HOSPITAL_COMMUNITY): Payer: Medicare Other | Admitting: Hematology

## 2020-06-27 ENCOUNTER — Other Ambulatory Visit: Payer: Self-pay | Admitting: "Endocrinology

## 2020-06-28 ENCOUNTER — Inpatient Hospital Stay (HOSPITAL_BASED_OUTPATIENT_CLINIC_OR_DEPARTMENT_OTHER): Payer: Medicare Other | Admitting: Oncology

## 2020-06-28 ENCOUNTER — Inpatient Hospital Stay (HOSPITAL_COMMUNITY): Payer: Medicare Other | Attending: Hematology

## 2020-06-28 ENCOUNTER — Other Ambulatory Visit: Payer: Self-pay

## 2020-06-28 VITALS — BP 147/68 | HR 73 | Temp 97.0°F | Resp 18 | Wt 269.2 lb

## 2020-06-28 DIAGNOSIS — Z87891 Personal history of nicotine dependence: Secondary | ICD-10-CM | POA: Insufficient documentation

## 2020-06-28 DIAGNOSIS — Z08 Encounter for follow-up examination after completed treatment for malignant neoplasm: Secondary | ICD-10-CM | POA: Insufficient documentation

## 2020-06-28 DIAGNOSIS — Z8572 Personal history of non-Hodgkin lymphomas: Secondary | ICD-10-CM | POA: Insufficient documentation

## 2020-06-28 DIAGNOSIS — Z23 Encounter for immunization: Secondary | ICD-10-CM | POA: Diagnosis not present

## 2020-06-28 DIAGNOSIS — R600 Localized edema: Secondary | ICD-10-CM | POA: Diagnosis not present

## 2020-06-28 DIAGNOSIS — Z9221 Personal history of antineoplastic chemotherapy: Secondary | ICD-10-CM | POA: Insufficient documentation

## 2020-06-28 DIAGNOSIS — I110 Hypertensive heart disease with heart failure: Secondary | ICD-10-CM | POA: Diagnosis not present

## 2020-06-28 DIAGNOSIS — D6489 Other specified anemias: Secondary | ICD-10-CM

## 2020-06-28 DIAGNOSIS — E782 Mixed hyperlipidemia: Secondary | ICD-10-CM | POA: Diagnosis not present

## 2020-06-28 DIAGNOSIS — Z7901 Long term (current) use of anticoagulants: Secondary | ICD-10-CM | POA: Insufficient documentation

## 2020-06-28 DIAGNOSIS — I5032 Chronic diastolic (congestive) heart failure: Secondary | ICD-10-CM | POA: Insufficient documentation

## 2020-06-28 DIAGNOSIS — D696 Thrombocytopenia, unspecified: Secondary | ICD-10-CM

## 2020-06-28 DIAGNOSIS — I872 Venous insufficiency (chronic) (peripheral): Secondary | ICD-10-CM | POA: Insufficient documentation

## 2020-06-28 DIAGNOSIS — Z9484 Stem cells transplant status: Secondary | ICD-10-CM | POA: Diagnosis not present

## 2020-06-28 DIAGNOSIS — E559 Vitamin D deficiency, unspecified: Secondary | ICD-10-CM

## 2020-06-28 DIAGNOSIS — C8330 Diffuse large B-cell lymphoma, unspecified site: Secondary | ICD-10-CM

## 2020-06-28 DIAGNOSIS — E119 Type 2 diabetes mellitus without complications: Secondary | ICD-10-CM | POA: Diagnosis not present

## 2020-06-28 DIAGNOSIS — Z79899 Other long term (current) drug therapy: Secondary | ICD-10-CM | POA: Insufficient documentation

## 2020-06-28 DIAGNOSIS — D591 Autoimmune hemolytic anemia, unspecified: Secondary | ICD-10-CM

## 2020-06-28 DIAGNOSIS — Z794 Long term (current) use of insulin: Secondary | ICD-10-CM | POA: Diagnosis not present

## 2020-06-28 DIAGNOSIS — Z923 Personal history of irradiation: Secondary | ICD-10-CM | POA: Insufficient documentation

## 2020-06-28 DIAGNOSIS — D5919 Other autoimmune hemolytic anemia: Secondary | ICD-10-CM

## 2020-06-28 LAB — COMPREHENSIVE METABOLIC PANEL
ALT: 15 U/L (ref 0–44)
AST: 16 U/L (ref 15–41)
Albumin: 3.4 g/dL — ABNORMAL LOW (ref 3.5–5.0)
Alkaline Phosphatase: 66 U/L (ref 38–126)
Anion gap: 9 (ref 5–15)
BUN: 10 mg/dL (ref 8–23)
CO2: 26 mmol/L (ref 22–32)
Calcium: 8.8 mg/dL — ABNORMAL LOW (ref 8.9–10.3)
Chloride: 104 mmol/L (ref 98–111)
Creatinine, Ser: 1.26 mg/dL — ABNORMAL HIGH (ref 0.61–1.24)
GFR, Estimated: >60 mL/min (ref >60)
Glucose, Bld: 110 mg/dL — ABNORMAL HIGH (ref 70–99)
Potassium: 3.5 mmol/L (ref 3.5–5.1)
Sodium: 139 mmol/L (ref 135–145)
Total Bilirubin: 0.8 mg/dL (ref 0.3–1.2)
Total Protein: 6.1 g/dL — ABNORMAL LOW (ref 6.5–8.1)

## 2020-06-28 LAB — CBC WITH DIFFERENTIAL/PLATELET
Abs Immature Granulocytes: 0 10*3/uL (ref 0.00–0.07)
Basophils Absolute: 0 10*3/uL (ref 0.0–0.1)
Basophils Relative: 1 %
Eosinophils Absolute: 0.2 10*3/uL (ref 0.0–0.5)
Eosinophils Relative: 7 %
HCT: 40.3 % (ref 39.0–52.0)
Hemoglobin: 13.2 g/dL (ref 13.0–17.0)
Immature Granulocytes: 0 %
Lymphocytes Relative: 16 %
Lymphs Abs: 0.5 10*3/uL — ABNORMAL LOW (ref 0.7–4.0)
MCH: 32.2 pg (ref 26.0–34.0)
MCHC: 32.8 g/dL (ref 30.0–36.0)
MCV: 98.3 fL (ref 80.0–100.0)
Monocytes Absolute: 0.4 10*3/uL (ref 0.1–1.0)
Monocytes Relative: 12 %
Neutro Abs: 2.2 10*3/uL (ref 1.7–7.7)
Neutrophils Relative %: 64 %
Platelets: 145 10*3/uL — ABNORMAL LOW (ref 150–400)
RBC: 4.1 MIL/uL — ABNORMAL LOW (ref 4.22–5.81)
RDW: 13.5 % (ref 11.5–15.5)
WBC: 3.4 10*3/uL — ABNORMAL LOW (ref 4.0–10.5)
nRBC: 0 % (ref 0.0–0.2)

## 2020-06-28 LAB — FERRITIN: Ferritin: 267 ng/mL (ref 24–336)

## 2020-06-28 LAB — IRON AND TIBC
Iron: 56 ug/dL (ref 45–182)
Saturation Ratios: 18 % (ref 17.9–39.5)
TIBC: 315 ug/dL (ref 250–450)
UIBC: 259 ug/dL

## 2020-06-28 LAB — LACTATE DEHYDROGENASE: LDH: 152 U/L (ref 98–192)

## 2020-06-28 MED ORDER — INFLUENZA VAC A&B SA ADJ QUAD 0.5 ML IM PRSY
0.5000 mL | PREFILLED_SYRINGE | Freq: Once | INTRAMUSCULAR | Status: AC
  Administered 2020-06-28: 0.5 mL via INTRAMUSCULAR
  Filled 2020-06-28: qty 0.5

## 2020-06-28 NOTE — Progress Notes (Signed)
[name] [place], [place] [id number]   CLINIC:  Medical Oncology/Hematology  PCP:  [facility], [place] [address] [phone]   REASON FOR VISIT: Follow-up for autoimmune hemolytic anemia   CURRENT THERAPY: Surveillance  BRIEF ONCOLOGIC HISTORY:  Oncology History Overview Note  S/P chemotherapy following stabilization of the spine.  Initial biopsy was on 07/13/2005 on his bone marrow.  Thoracic vertebra was biopsied on 02/04/2005 and mediastinum on 02/03/2005.  S/P R-CHOP and achieved an incomplete response, then underwent ICE chemotherapy pre-transplant and total body radiation with autologous stem cell transplant on 01/15/2006 and maintained on Rituxan for 2 years.  Thus far without recurrence.   DLBCL (diffuse large B cell lymphoma) ([facility])  01/27/2005 Imaging   PET- diffuse activity  involving the L cervical area, extensive activity in the mediastinumn particularly in the R paratracheal area and to a lesser degree in the anterior mediastinum.  Lg area in porta caval nodes in upper abd and diffuse bone activity   02/03/2005 Pathology Results   Mediastinal lymph node demonstrating diffuse large cell lymphoma, B-type.  Vertebral bone biopsy demonstrating diffuse large b-cell lymphoma   03/11/2005 - 08/04/2005 Chemotherapy   R-CHOP x 8 cycles   05/04/2005 Imaging   PET- near resolution of previously identified uptake.   07/13/2005 Bone Marrow Biopsy   Bone marrow aspiration and biopsy is negative for lymphoma   08/31/2005 Imaging   PET- further response to therapy with findings consistent with residual disease.  Hypermetabolic activity identified within the anterior mediastinal lymph node (smaller but still pathologic).  Borderline hypermetabolic small right pleural effusion   10/20/2005 - 11/13/2005 Chemotherapy   R-ICE x 2 cycles at [facility]   01/08/2006 - 01/09/2006 Chemotherapy   Cyclophosphamide 600 mg daily x 2     01/10/2006 - 01/14/2006 Radiation Therapy   Total body radiation twice daily   01/15/2006 Bone Marrow Transplant   Stem cell transplant at [facility]   04/05/2006 - 12/05/2007 Chemotherapy   Rituxan weekly x 4 every 6 months x 2 years     CANCER STAGING: Cancer Staging DLBCL (diffuse large B cell lymphoma) (HCC) Staging form: Lymphoid Neoplasms, AJCC 6th Edition - Clinical: Stage IV - Signed by Baird Cancer, PA on 04/29/2011   INTERVAL HISTORY:  [name] 69 y.o. male returns for routine follow-up for autoimmune hemolytic anemia.  [name] reports feeling well.  He denies any new concerns. Denies any nausea, vomiting, or diarrhea. Denies any new pains. Had not noticed any recent bleeding such as epistaxis, hematuria or hematochezia. Denies recent chest pain on exertion, shortness of breath on minimal exertion, pre-syncopal episodes, or palpitations. Denies any numbness or tingling in hands or feet. Denies any recent fevers, infections, or recent hospitalizations. Patient reports appetite at 75% and energy level at 80%.  He is eating well maintain his weight this time.   REVIEW OF SYSTEMS:  Review of Systems  Constitutional: Negative.  Negative for appetite change, chills, fatigue and fever.  HENT:  Negative.  Negative for hearing loss, lump/mass, mouth sores and nosebleeds.   Eyes: Negative.  Negative for eye problems.  Respiratory: Positive for cough and shortness of breath. Negative for hemoptysis.   Cardiovascular: Negative.  Negative for chest pain and leg swelling.  Gastrointestinal: Positive for constipation. Negative for abdominal pain, blood in stool, diarrhea, nausea and vomiting.  Endocrine: Negative.  Negative for hot flashes.  Genitourinary: Negative.  Negative for bladder incontinence, difficulty urinating, dysuria,  frequency and hematuria.   Musculoskeletal: Negative.  Negative for back pain, flank pain, gait problem and myalgias.  Skin: Negative.  Negative for itching and  rash.  Neurological: Negative.  Negative for dizziness, gait problem, headaches, light-headedness and numbness.  Hematological: Negative.  Negative for adenopathy.  Psychiatric/Behavioral: Negative for confusion. The patient is not nervous/anxious.      PAST MEDICAL/SURGICAL HISTORY:  Past Medical History:  Diagnosis Date  . Asthma    as child  . Diabetes mellitus without complication ([facility])   . DLBCL (diffuse large B cell lymphoma) ([facility]) 02/28/2009  . Edema   . Heart failure, diastolic, chronic ([facility])    patient denies  . Hyperlipidemia, mixed   . Hypertension   . IDA (iron deficiency anemia)   . Large cell lymphoma ([facility]) 01/2005   autologous stem cell transplant 12/2005  . Obesity, morbid (more than 100 lbs over ideal weight or BMI > 40) (HCC)   . Venous insufficiency 04/29/2011   Past Surgical History:  Procedure Laterality Date  . BACK SURGERY  2006   T6 vertebrae removed/titanium placed  . CATARACT EXTRACTION W/PHACO Right 03/31/2019   Procedure: CATARACT EXTRACTION PHACO AND INTRAOCULAR LENS PLACEMENT (IOC);  Surgeon: Baruch Goldmann, MD;  Location: [facility];  Service: Ophthalmology;  Laterality: Right;  CDE: 3.21  . CATARACT EXTRACTION W/PHACO Left 04/14/2019   Procedure: CATARACT EXTRACTION PHACO AND INTRAOCULAR LENS PLACEMENT (IOC);  Surgeon: Baruch Goldmann, MD;  Location: [facility];  Service: Ophthalmology;  Laterality: Left;  left - pt knows to arrive at 7:45, CDE: 3.55  . COLONOSCOPY  11/24/2004   Polyps in the left colon ablated/removed as described above.  Two submucosal lesions consistent with lipomas as described above not  manipulated./ Normal rectum  . COLONOSCOPY  06/20/2012   MULTIPLE RECTAL AND COLONIC POLYPS  . COLONOSCOPY N/A 03/08/2015   RMR: Capacious, redundant colon. Multiple colonic and rectosigmoid polyps removed. ablated as described above. colonic lipoma abnormal appearing terminal ileum likely a variant of normal). Howeverwith history  biopsies obtained.   .  ESOPHAGOGASTRODUODENOSCOPY N/A 03/08/2015   RMR: Hiatal hernia Polypoid gastric mucosa with multiple gastric polyps. largest polyp removed via snare polypectomgy hemostasis clip placed at base. Status post gastric biopsy. Status post video capsule placement.   Marland Kitchen GIVENS CAPSULE STUDY N/A 03/08/2015   Procedure: GIVENS CAPSULE STUDY;  Surgeon: Daneil Dolin, MD;  Location: [facility];  Service: Endoscopy;  Laterality: N/A;  . LIMBAL STEM CELL TRANSPLANT    . LUNG BIOPSY  6/06  . MULTIPLE TOOTH EXTRACTIONS  04/2005  . port a cath placement    . PORT-A-CATH REMOVAL  09/08/2012   Procedure: REMOVAL PORT-A-CATH;  Surgeon: Melrose Nakayama, MD;  Location: [facility];  Service: Thoracic;  Laterality: N/A;     SOCIAL HISTORY:  Social History   Socioeconomic History  . Marital status: Married    Spouse name: Not on file  . Number of children: 2  . Years of education: Not on file  . Highest education level: Not on file  Occupational History  . Occupation: disability due to back    Employer: UNEMPLOYED  Tobacco Use  . Smoking status: Former Smoker    Packs/day: 0.50    Years: 15.00    Pack years: 7.50    Quit date: 11/11/2002    Years since quitting: 17.6  . Smokeless tobacco: Never Used  Vaping Use  . Vaping Use: Never used  Substance and Sexual Activity  . Alcohol use: No  Alcohol/week: 0.0 standard drinks  . Drug use: No  . Sexual activity: Yes    Birth control/protection: None  Other Topics Concern  . Not on file  Social History Narrative   Married   No regular exercise   Social Determinants of Health   Financial Resource Strain:   . Difficulty of Paying Living Expenses: Not on file  Food Insecurity:   . Worried About Charity fundraiser in the Last Year: Not on file  . Ran Out of Food in the Last Year: Not on file  Transportation Needs:   . Lack of Transportation (Medical): Not on file  . Lack of Transportation (Non-Medical): Not on file  Physical Activity:   . Days  of Exercise per Week: Not on file  . Minutes of Exercise per Session: Not on file  Stress:   . Feeling of Stress : Not on file  Social Connections:   . Frequency of Communication with Friends and Family: Not on file  . Frequency of Social Gatherings with Friends and Family: Not on file  . Attends Religious Services: Not on file  . Active Member of Clubs or Organizations: Not on file  . Attends Archivist Meetings: Not on file  . Marital Status: Not on file  Intimate Partner Violence:   . Fear of Current or Ex-Partner: Not on file  . Emotionally Abused: Not on file  . Physically Abused: Not on file  . Sexually Abused: Not on file    FAMILY HISTORY:  Family History  Problem Relation Age of Onset  . Cancer Mother        lung  . Hypertension Mother   . Hyperlipidemia Mother   . Cancer Father        prostate  . Hypertension Father   . Colon cancer Neg Hx   . Diabetes Neg Hx     CURRENT MEDICATIONS:  Outpatient Encounter Medications as of 06/28/2020  Medication Sig  . apixaban (ELIQUIS) 5 MG TABS tablet Take 1 tablet (5 mg total) by mouth 2 (two) times daily.  . Blood Glucose Monitoring Suppl (ONE TOUCH ULTRA 2) w/Device KIT USE TO TEST BLOOD SUGAR ONCE D UTD  . clotrimazole-betamethasone (LOTRISONE) cream Apply 1 application topically 2 (two) times daily as needed (leg infection).   Marland Kitchen ergocalciferol (VITAMIN D2) 1.25 MG (50000 UT) capsule Take 1 capsule (50,000 Units total) by mouth once a week.  . folic acid (FOLVITE) 1 MG tablet TAKE 1 TABLET(1 MG) BY MOUTH DAILY  . furosemide (LASIX) 20 MG tablet Take 20 mg by mouth daily as needed for fluid or edema.   . insulin degludec (TRESIBA FLEXTOUCH) 100 UNIT/ML FlexTouch Pen Inject 0.35 mLs (35 Units total) into the skin at bedtime.  . Insulin Pen Needle (B-D ULTRAFINE III SHORT PEN) 31G X 8 MM MISC 1 each by Does not apply route daily. Use once a day as directed  . Lancets (ONETOUCH DELICA PLUS [id number]) MISC Use to check  blood glucose twice daily, before breakfast and before bed  . metFORMIN (GLUCOPHAGE) 500 MG tablet TAKE 1 TABLET(500 MG) BY MOUTH TWICE DAILY WITH A MEAL  . metoprolol tartrate (LOPRESSOR) 25 MG tablet Take 1 tablet (25 mg total) by mouth 2 (two) times daily.  Glory Rosebush ULTRA test strip USE TO TEST BLOOD SUGAR 2 TIMES A DAY  . rosuvastatin (CRESTOR) 40 MG tablet Take 40 mg by mouth daily.  . vitamin B-12 (CYANOCOBALAMIN) 1000 MCG tablet Take 2,000 mcg by mouth  daily.   No facility-administered encounter medications on file as of 06/28/2020.    ALLERGIES:  Allergies  Allergen Reactions  . Penicillins Rash    Has patient had a PCN reaction causing immediate rash, facial/tongue/throat swelling, SOB or lightheadedness with hypotension: Unknown Has patient had a PCN reaction causing severe rash involving mucus membranes or skin necrosis: Unknown Has patient had a PCN reaction that required hospitalization: Unknown Has patient had a PCN reaction occurring within the last 10 years: No If all of the above answers are "NO", then may proceed with Cephalosporin use.      PHYSICAL EXAM:  ECOG Performance status: 1  There were no vitals filed for this visit. There were no vitals filed for this visit. Physical Exam Constitutional:      Appearance: Normal appearance. He is normal weight.  Cardiovascular:     Rate and Rhythm: Normal rate and regular rhythm.     Heart sounds: Normal heart sounds.  Pulmonary:     Effort: Pulmonary effort is normal.     Breath sounds: Normal breath sounds.  Abdominal:     General: Bowel sounds are normal.     Palpations: Abdomen is soft.  Musculoskeletal:        General: Normal range of motion.  Skin:    General: Skin is warm.  Neurological:     Mental Status: He is alert and oriented to person, place, and time. Mental status is at baseline.  Psychiatric:        Mood and Affect: Mood normal.        Behavior: Behavior normal.        Thought Content:  Thought content normal.        Judgment: Judgment normal.      LABORATORY DATA:  I have reviewed the labs as listed.  CBC    Component Value Date/Time   WBC 3.4 (L) 06/28/2020 0943   RBC 4.10 (L) 06/28/2020 0943   HGB 13.2 06/28/2020 0943   HCT 40.3 06/28/2020 0943   PLT 145 (L) 06/28/2020 0943   MCV 98.3 06/28/2020 0943   MCH 32.2 06/28/2020 0943   MCHC 32.8 06/28/2020 0943   RDW 13.5 06/28/2020 0943   LYMPHSABS 0.5 (L) 06/28/2020 0943   MONOABS 0.4 06/28/2020 0943   EOSABS 0.2 06/28/2020 0943   BASOSABS 0.0 06/28/2020 0943   CMP Latest Ref Rng & Units 06/28/2020 05/23/2020 05/01/2020  Glucose 70 - 99 mg/dL 110(H) 98 100(H)  BUN 8 - 23 mg/dL _0 Creatinine 0.61 - 1.24 mg/dL 1.26(H) 1.21 1.11  Sodium 135 - 145 mmol/L 139 140 142  Potassium 3.5 - 5.1 mmol/L 3.5 3.7 3.6  Chloride 98 - 111 mmol/L 104 105 105  CO2 22 - 32 mmol/L _1 Calcium 8.9 - 10.3 mg/dL 8.8(L) 9.0 9.1  Total Protein 6.5 - 8.1 g/dL 6.1(L) 6.2(L) 6.5  Total Bilirubin 0.3 - 1.2 mg/dL 0.8 0.5 0.8  Alkaline Phos 38 - 126 U/L 66 65 67  AST 15 - 41 U/L _2 ALT 0 - 44 U/L _3 All questions were answered to patient's stated satisfaction. Encouraged patient to call with any new concerns or questions before his next visit to the cancer center and we can certain see him sooner, if needed.     ASSESSMENT & PLAN:  1.  Autoimmune hemolytic anemia: -Prednisone 120 mg daily started on 08/05/2019, rituximab 4 doses from 08/15/2019 through 09/05/2019. -Prednisone was  tapered off very slowly. -We gave 2 IV iron infusions on 01/05/2020 and 01/12/2020 due to his ferritin being 92, percent saturation 18. -He has been off of his prednisone since the end of July 2021 -Labs done on 06/28/2020 show a hemoglobin of 13.2, WBC 3.4, platelet 145,000, ferritin 267, saturation ratio 18% and a slight bump in his creatinine 1.26. -We will see him back in 8 weeks.  2.  Leg swelling: -He will use Lasix 20 mg daily  as needed.  3.  Poorly controlled diabetes: -Glucose today is 110.  He is taking Amaryl 2 mg twice daily. -Improved since stopping steroids  4.  DLBCL: -Diagnosed in 2006, treated with R-CHOP with complete response on PET scan. -Relapse of lymphoma on 08/31/2005, treated with salvage chemotherapy followed by stem cell transplant on 01/15/2006. -Rituximab weekly x4 followed by every 6 months for 2 years completed on 12/05/2007. -PET scan on 08/14/2019 did not show any evidence or recurrence of lymphoma. -No B symptoms at this time.  Disposition: -Return to clinic in 8 weeks with labs (CBC, CMP, ferritin, iron panel LDH) and assessment.  No problem-specific Assessment & Plan notes found for this encounter.  Greater than 50% was spent in counseling and coordination of care with this patient including but not limited to discussion of the relevant topics above (See A&P) including, but not limited to diagnosis and management of acute and chronic medical conditions. ,e  Orders placed this encounter:  No orders of the defined types were placed in this encounter.  Faythe Casa, NP 06/28/2020 11:09 AM  [place] [phone]

## 2020-06-28 NOTE — Progress Notes (Signed)
[name] presents today for injection per the provider's orders.  High Dose Flu administration without incident; injection site WNL; see MAR for injection details.  Patient tolerated procedure well and without incident.  No questions or complaints noted at this time.

## 2020-07-02 DIAGNOSIS — E1165 Type 2 diabetes mellitus with hyperglycemia: Secondary | ICD-10-CM | POA: Diagnosis not present

## 2020-07-03 LAB — LIPID PANEL
Chol/HDL Ratio: 3.2 ratio (ref 0.0–5.0)
Cholesterol, Total: 151 mg/dL (ref 100–199)
HDL: 47 mg/dL (ref >39)
LDL Chol Calc (NIH): 78 mg/dL (ref 0–99)
Triglycerides: 147 mg/dL (ref 0–149)
VLDL Cholesterol Cal: 26 mg/dL (ref 5–40)

## 2020-07-03 LAB — HEMOGLOBIN A1C
Est. average glucose Bld gHb Est-mCnc: 91 mg/dL
Hgb A1c MFr Bld: 4.8 % (ref 4.8–5.6)

## 2020-07-03 LAB — COMPREHENSIVE METABOLIC PANEL
ALT: 14 IU/L (ref 0–44)
AST: 16 IU/L (ref 0–40)
Albumin/Globulin Ratio: 2.1 (ref 1.2–2.2)
Albumin: 3.7 g/dL — ABNORMAL LOW (ref 3.8–4.8)
Alkaline Phosphatase: 87 IU/L (ref 44–121)
BUN/Creatinine Ratio: 8 — ABNORMAL LOW (ref 10–24)
BUN: 9 mg/dL (ref 8–27)
Bilirubin Total: 0.4 mg/dL (ref 0.0–1.2)
CO2: 24 mmol/L (ref 20–29)
Calcium: 8.8 mg/dL (ref 8.6–10.2)
Chloride: 107 mmol/L — ABNORMAL HIGH (ref 96–106)
Creatinine, Ser: 1.18 mg/dL (ref 0.76–1.27)
GFR calc Af Amer: 73 mL/min/{1.73_m2} (ref >59)
GFR calc non Af Amer: 63 mL/min/{1.73_m2} (ref >59)
Globulin, Total: 1.8 g/dL (ref 1.5–4.5)
Glucose: 84 mg/dL (ref 65–99)
Potassium: 3.7 mmol/L (ref 3.5–5.2)
Sodium: 144 mmol/L (ref 134–144)
Total Protein: 5.5 g/dL — ABNORMAL LOW (ref 6.0–8.5)

## 2020-07-03 LAB — T4, FREE: Free T4: 1.06 ng/dL (ref 0.82–1.77)

## 2020-07-03 LAB — MICROALBUMIN / CREATININE URINE RATIO
Creatinine, Urine: 125.7 mg/dL
Microalb/Creat Ratio: 3963 mg/g{creat} — ABNORMAL HIGH (ref 0–29)
Microalbumin, Urine: 4981.5 ug/mL

## 2020-07-03 LAB — TSH: TSH: 2.76 u[IU]/mL (ref 0.450–4.500)

## 2020-08-27 ENCOUNTER — Ambulatory Visit: Payer: Medicare Other | Admitting: Podiatry

## 2020-08-27 ENCOUNTER — Telehealth: Payer: Self-pay | Admitting: Nurse Practitioner

## 2020-08-27 NOTE — Telephone Encounter (Signed)
Pt got his labs done in Nov and he is supposed to come 09/03/20. Are those labs okay or does he need new labs. Not sure if insurance will allow more labs within 3 months.

## 2020-08-27 NOTE — Telephone Encounter (Signed)
It will have to be ok because insurance won't cover it again

## 2020-09-02 DIAGNOSIS — J22 Unspecified acute lower respiratory infection: Secondary | ICD-10-CM | POA: Diagnosis not present

## 2020-09-02 DIAGNOSIS — Z681 Body mass index (BMI) 19 or less, adult: Secondary | ICD-10-CM | POA: Diagnosis not present

## 2020-09-03 ENCOUNTER — Encounter: Payer: Medicare Other | Attending: Internal Medicine | Admitting: Nutrition

## 2020-09-03 ENCOUNTER — Encounter: Payer: Self-pay | Admitting: Nutrition

## 2020-09-03 ENCOUNTER — Other Ambulatory Visit: Payer: Self-pay

## 2020-09-03 ENCOUNTER — Telehealth (INDEPENDENT_AMBULATORY_CARE_PROVIDER_SITE_OTHER): Payer: Medicare Other | Admitting: Nurse Practitioner

## 2020-09-03 ENCOUNTER — Encounter: Payer: Self-pay | Admitting: Nurse Practitioner

## 2020-09-03 VITALS — Wt 269.0 lb

## 2020-09-03 DIAGNOSIS — E782 Mixed hyperlipidemia: Secondary | ICD-10-CM | POA: Insufficient documentation

## 2020-09-03 DIAGNOSIS — Z86010 Personal history of colonic polyps: Secondary | ICD-10-CM | POA: Insufficient documentation

## 2020-09-03 DIAGNOSIS — Z6836 Body mass index (BMI) 36.0-36.9, adult: Secondary | ICD-10-CM | POA: Diagnosis present

## 2020-09-03 DIAGNOSIS — E559 Vitamin D deficiency, unspecified: Secondary | ICD-10-CM | POA: Diagnosis not present

## 2020-09-03 DIAGNOSIS — E1165 Type 2 diabetes mellitus with hyperglycemia: Secondary | ICD-10-CM

## 2020-09-03 DIAGNOSIS — I10 Essential (primary) hypertension: Secondary | ICD-10-CM | POA: Insufficient documentation

## 2020-09-03 DIAGNOSIS — D508 Other iron deficiency anemias: Secondary | ICD-10-CM | POA: Insufficient documentation

## 2020-09-03 MED ORDER — TRESIBA FLEXTOUCH 100 UNIT/ML ~~LOC~~ SOPN: 35.0000 [IU] | PEN_INJECTOR | Freq: Every day | SUBCUTANEOUS | 3 refills | Status: DC

## 2020-09-03 NOTE — Patient Instructions (Signed)
.   Goals  Avoid skipping meals. Drink Glucerna in place of a meal if necessary. Try broth soups when poor appetite. Eat small frequent meals of good quality foods. Consider getting tested for covid based on your symptoms.

## 2020-09-03 NOTE — Patient Instructions (Signed)

## 2020-09-03 NOTE — Progress Notes (Signed)
PHONE VISIT Medical Nutrition Therapy:  Appt start time: 161 end time 0920  Assessment:  Primary concerns today:  Diabetes Type 2, Obesity, HTN and Hyperlipidemia.  This visit was completed via telephone due to the COVID-19 pandemic.   I spoke with [name] and verified that I was speaking with the correct person with two patient identifiers (full name and date of birth).   I discussed the limitations related to this kind of visit and the patient is willing to proceed. This SmartLink has not been configured with any valid records.   Lab Results  Component Value Date   HGBA1C 4.8 07/02/2020   Talked to Frank Pigg, FNP today. Latest A1C 4.8% Tresiba 35 units. No low blood sugars below 90's.    Got an antibiotic today for ongoing cough and not feeling well. Encouraged him to go get tested for covid due to his congested cough, fatigue and weakness. No real appetite. Some nausea but no vomiting.  H/o Non hodgkins lymphoma in 2006- had stem cell transplan. He went to podiatrist.   Surprisingly, no weight loss. Notes he eat well and cautiously over the holiday. A1C may be inaccurate and effected by his anemia, lymphoma and low red blood supply. Tolerating insulin well and no hypoglycemia episodes.  Diffuse Large B Cell Lymphoma in remission.. Followed by oncology.  Lab Results  Component Value Date   HGBA1C 4.8 07/02/2020   CMP Latest Ref Rng & Units 07/02/2020 06/28/2020 05/23/2020  Glucose 65 - 99 mg/dL 84 110(H) 98  BUN 8 - 27 mg/dL 9 10 15   Creatinine 0.76 - 1.27 mg/dL 1.18 1.26(H) 1.21  Sodium 134 - 144 mmol/L 144 139 140  Potassium 3.5 - 5.2 mmol/L 3.7 3.5 3.7  Chloride 96 - 106 mmol/L 107(H) 104 105  CO2 20 - 29 mmol/L 24 26 28   Calcium 8.6 - 10.2 mg/dL 8.8 8.8(L) 9.0  Total Protein 6.0 - 8.5 g/dL 5.5(L) 6.1(L) 6.2(L)  Total Bilirubin 0.0 - 1.2 mg/dL 0.4 0.8 0.5  Alkaline Phos 44 - 121 IU/L 87 66 65  AST 0 - 40 IU/L 16 16 18   ALT 0 - 44 IU/L 14 15 14    CBC     Component Value Date/Time   WBC 3.4 (L) 06/28/2020 0943   RBC 4.10 (L) 06/28/2020 0943   HGB 13.2 06/28/2020 0943   HCT 40.3 06/28/2020 0943   PLT 145 (L) 06/28/2020 0943   MCV 98.3 06/28/2020 0943   MCH 32.2 06/28/2020 0943   MCHC 32.8 06/28/2020 0943   RDW 13.5 06/28/2020 0943   LYMPHSABS 0.5 (L) 06/28/2020 0943   MONOABS 0.4 06/28/2020 0943   EOSABS 0.2 06/28/2020 0943   BASOSABS 0.0 06/28/2020 0943     Wt Readings from Last 3 Encounters:  09/03/20 269 lb (122 kg)  06/28/20 269 lb 3.2 oz (122.1 kg)  05/27/20 266 lb (120.7 kg)   Ht Readings from Last 3 Encounters:  05/27/20 5\' 11"  (1.803 m)  05/01/20 5\' 11"  (1.803 m)  05/01/20 5\' 11"  (1.803 m)   There is no height or weight on file to calculate BMI. @BMIFA @ Facility age limit for growth percentiles is 20 years. Facility age limit for growth percentiles is 20 years.    24 hr recall B egg, toast and milk. L  Can't remember. Not a good appetite.  D- broiled fish ,  water  Usual physical activity: ADL  Estimated energy needs: 1800 calories 200 g carbohydrates 135 g protein 50 g fat  Progress  Towards Goal(s):  In progress.   Nutritional Diagnosis:  NB-1.1 Food and nutrition-related knowledge deficit As related to Diabetes Type 2.  As evidenced by A1C > 15%.    Intervention:  Nutrition and Diabetes education provided on My Plate, CHO counting, meal planning, portion sizes, timing of meals, avoiding snacks between meals unless having a low blood sugar, target ranges for A1C and blood sugars, signs/symptoms and treatment of hyper/hypoglycemia, monitoring blood sugars, taking medications as prescribed, benefits of exercising 30 minutes per day and prevention of complications of DM.  Marland Kitchen Goals  Avoid skipping meals. Drink Glucerna in place of a meal if necessary. Try broth soups when poor appetite. Eat small frequent meals of good quality foods. Consider getting tested for covid based on your symptoms.  Teaching  Method Utilized:  Visual Auditory Hands on  Handouts given during visit include: Reviewed Herbs to season with and importance of hydration and small frequent meals.   Barriers to learning/adherence to lifestyle change: limited mobility  Demonstrated degree of understanding via:  Teach Back   Monitoring/Evaluation:  Dietary intake, exercise, , and body weight in 1 month.Marland Kitchen

## 2020-09-03 NOTE — Progress Notes (Signed)
09/03/2020, 8:56 AM  Endocrinology follow-up note   TELEHEALTH VISIT: The patient is being engaged in telehealth visit due to COVID-19.  This type of visit limits physical examination significantly, and thus is not preferable over face-to-face encounters.  I connected with  [name] on 09/03/20 by a video enabled telemedicine application and verified that I am speaking with the correct person using two identifiers.   I discussed the limitations of evaluation and management by telemedicine. The patient expressed understanding and agreed to proceed.    The participants involved in this visit include: Frank Romp, NP located at [facility] and [name]  located at their personal residence listed.  Subjective:    Patient ID: [name], male    DOB: 1951/08/14.  [name] is being seen in follow-up after he was seen in consultation for management of currently uncontrolled symptomatic diabetes requested by  Redmond School, MD.   Past Medical History:  Diagnosis Date  . Asthma    as child  . Diabetes mellitus without complication ([place])   . DLBCL (diffuse large B cell lymphoma) ([place]) 02/28/2009  . Edema   . Heart failure, diastolic, chronic ([place])    patient denies  . Hyperlipidemia, mixed   . Hypertension   . IDA (iron deficiency anemia)   . Large cell lymphoma ([place]) 01/2005   autologous stem cell transplant 12/2005  . Obesity, morbid (more than 100 lbs over ideal weight or BMI > 40) (H[facility])   . Venous insufficiency 04/29/2011    Past Surgical History:  Procedure Laterality Date  . BACK SURGERY  2006   T6 vertebrae removed/titanium placed  . CATARACT EXTRACTION W/PHACO Right 03/31/2019   Procedure: CATARACT EXTRACTION PHACO AND INTRAOCULAR LENS PLACEMENT (IOC);  Surgeon: Baruch Goldmann, MD;  Location: [facility];  Service: Ophthalmology;  Laterality: Right;  CDE: 3.21  .  CATARACT EXTRACTION W/PHACO Left 04/14/2019   Procedure: CATARACT EXTRACTION PHACO AND INTRAOCULAR LENS PLACEMENT (IOC);  Surgeon: Baruch Goldmann, MD;  Location: [facility];  Service: Ophthalmology;  Laterality: Left;  left - pt knows to arrive at 7:45, CDE: 3.55  . COLONOSCOPY  11/24/2004   Polyps in the left colon ablated/removed as described above.  Two submucosal lesions consistent with lipomas as described above not  manipulated./ Normal rectum  . COLONOSCOPY  06/20/2012   MULTIPLE RECTAL AND COLONIC POLYPS  . COLONOSCOPY N/A 03/08/2015   RMR: Capacious, redundant colon. Multiple colonic and rectosigmoid polyps removed. ablated as described above. colonic lipoma abnormal appearing terminal ileum likely a variant of normal). Howeverwith history  biopsies obtained.   . ESOPHAGOGASTRODUODENOSCOPY N/A 03/08/2015   RMR: Hiatal hernia Polypoid gastric mucosa with multiple gastric polyps. largest polyp removed via snare polypectomgy hemostasis clip placed at base. Status post gastric biopsy. Status post video capsule placement.   Marland Kitchen GIVENS CAPSULE STUDY N/A 03/08/2015   Procedure: GIVENS CAPSULE STUDY;  Surgeon: Daneil Dolin, MD;  Location: [facility];  Service: Endoscopy;  Laterality: N/A;  . LIMBAL STEM CELL TRANSPLANT    . LUNG BIOPSY  6/06  . MULTIPLE TOOTH EXTRACTIONS  04/2005  . port a cath placement    .  PORT-A-CATH REMOVAL  09/08/2012   Procedure: REMOVAL PORT-A-CATH;  Surgeon: Melrose Nakayama, MD;  Location: [facility] OR;  Service: Thoracic;  Laterality: N/A;    Social History   Socioeconomic History  . Marital status: Married    Spouse name: Not on file  . Number of children: 2  . Years of education: Not on file  . Highest education level: Not on file  Occupational History  . Occupation: disability due to back    Employer: UNEMPLOYED  Tobacco Use  . Smoking status: Former Smoker    Packs/day: 0.50    Years: 15.00    Pack years: 7.50    Quit date: 11/11/2002    Years since  quitting: 17.8  . Smokeless tobacco: Never Used  Vaping Use  . Vaping Use: Never used  Substance and Sexual Activity  . Alcohol use: No    Alcohol/week: 0.0 standard drinks  . Drug use: No  . Sexual activity: Yes    Birth control/protection: None  Other Topics Concern  . Not on file  Social History Narrative   Married   No regular exercise   Social Determinants of Health   Financial Resource Strain: Low Risk   . Difficulty of Paying Living Expenses: Not hard at all  Food Insecurity: No Food Insecurity  . Worried About Charity fundraiser in the Last Year: Never true  . Ran Out of Food in the Last Year: Never true  Transportation Needs: No Transportation Needs  . Lack of Transportation (Medical): No  . Lack of Transportation (Non-Medical): No  Physical Activity: Inactive  . Days of Exercise per Week: 0 days  . Minutes of Exercise per Session: 0 min  Stress: No Stress Concern Present  . Feeling of Stress : Not at all  Social Connections: Moderately Integrated  . Frequency of Communication with Friends and Family: More than three times a week  . Frequency of Social Gatherings with Friends and Family: Twice a week  . Attends Religious Services: More than 4 times per year  . Active Member of Clubs or Organizations: No  . Attends Archivist Meetings: Never  . Marital Status: Married    Family History  Problem Relation Age of Onset  . Cancer Mother        lung  . Hypertension Mother   . Hyperlipidemia Mother   . Cancer Father        prostate  . Hypertension Father   . Colon cancer Neg Hx   . Diabetes Neg Hx     Outpatient Encounter Medications as of 09/03/2020  Medication Sig  . apixaban (ELIQUIS) 5 MG TABS tablet Take 1 tablet (5 mg total) by mouth 2 (two) times daily.  Marland Kitchen azithromycin (ZITHROMAX) 250 MG tablet Take 250 mg by mouth as directed.  . Blood Glucose Monitoring Suppl (ONE TOUCH ULTRA 2) w/Device KIT USE TO TEST BLOOD SUGAR ONCE D UTD  .  clotrimazole-betamethasone (LOTRISONE) cream Apply 1 application topically 2 (two) times daily as needed (leg infection).   Marland Kitchen ergocalciferol (VITAMIN D2) 1.25 MG (50000 UT) capsule Take 1 capsule (50,000 Units total) by mouth once a week.  . folic acid (FOLVITE) 1 MG tablet TAKE 1 TABLET(1 MG) BY MOUTH DAILY  . furosemide (LASIX) 20 MG tablet Take 20 mg by mouth daily as needed for fluid or edema.   . Insulin Pen Needle (B-D ULTRAFINE III SHORT PEN) 31G X 8 MM MISC 1 each by Does not apply route daily. Use  once a day as directed  . Lancets (ONETOUCH DELICA PLUS [id number]) MISC Use to check blood glucose twice daily, before breakfast and before bed  . metFORMIN (GLUCOPHAGE) 500 MG tablet TAKE 1 TABLET(500 MG) BY MOUTH TWICE DAILY WITH A MEAL  . metoprolol tartrate (LOPRESSOR) 25 MG tablet Take 1 tablet (25 mg total) by mouth 2 (two) times daily.  Glory Rosebush ULTRA test strip USE TO TEST BLOOD SUGAR 2 TIMES A DAY  . rosuvastatin (CRESTOR) 40 MG tablet Take 40 mg by mouth daily.  . vitamin B-12 (CYANOCOBALAMIN) 1000 MCG tablet Take 2,000 mcg by mouth daily.  . [DISCONTINUED] insulin degludec (TRESIBA FLEXTOUCH) 100 UNIT/ML FlexTouch Pen Inject 0.35 mLs (35 Units total) into the skin at bedtime.  . insulin degludec (TRESIBA FLEXTOUCH) 100 UNIT/ML FlexTouch Pen Inject 35 Units into the skin at bedtime.  . methylPREDNISolone (MEDROL DOSEPAK) 4 MG TBPK tablet Take by mouth.   No facility-administered encounter medications on file as of 09/03/2020.    ALLERGIES: Allergies  Allergen Reactions  . Penicillins Rash    Has patient had a PCN reaction causing immediate rash, facial/tongue/throat swelling, SOB or lightheadedness with hypotension: Unknown Has patient had a PCN reaction causing severe rash involving mucus membranes or skin necrosis: Unknown Has patient had a PCN reaction that required hospitalization: Unknown Has patient had a PCN reaction occurring within the last 10 years: No If all of the  above answers are "NO", then may proceed with Cephalosporin use.     VACCINATION STATUS: Immunization History  Administered Date(s) Administered  . Fluad Quad(high Dose 65+) 08/04/2019, 06/28/2020  . Influenza Split 06/08/2012  . Influenza,inj,Quad PF,6+ Mos 07/14/2013, 06/20/2015  . Influenza-Unspecified 07/15/2011, 06/02/2012  . Moderna Sars-Covid-2 Vaccination 11/03/2019, 12/05/2019    Diabetes He presents for his follow-up diabetic visit. He has type 2 diabetes mellitus. Onset time: He was diagnosed in March 2021 after at least 2 years of exposure to steroids related to his B-cell lymphoma which required autologous stem cell transplant bone marrow. His disease course has been stable. There are no hypoglycemic associated symptoms. Pertinent negatives for hypoglycemia include no confusion, headaches, pallor or seizures. Pertinent negatives for diabetes include no blurred vision, no chest pain, no fatigue, no polydipsia, no polyphagia, no polyuria and no weakness. There are no hypoglycemic complications. Symptoms are stable. Diabetic complications include PVD. Risk factors for coronary artery disease include diabetes mellitus, dyslipidemia, family history, male sex, obesity, tobacco exposure and sedentary lifestyle. Current diabetic treatment includes insulin injections and oral agent (monotherapy) (He is currently on glimepiride 1 mg twice a day.). He is compliant with treatment most of the time. His weight is fluctuating minimally. He is following a generally unhealthy diet. When asked about meal planning, he reported none. He has not had a previous visit with a dietitian. He rarely participates in exercise. His home blood glucose trend is fluctuating minimally. His breakfast blood glucose range is generally 110-130 mg/dl. His bedtime blood glucose range is generally 110-130 mg/dl. His overall blood glucose range is 110-130 mg/dl. (He presents for his virtual visit today with his meter and logs to  review.  His most recent A1c was 4.8% on 07/02/20, improving drastically from previous visit of 8.2%.  He denies any episodes of hypoglycemia.  His last 5 days worth of readings are as follows: 1/6: 147, x 1/7: 118, x 1/8: 168, 140 1/9: 226, 120 1/10: 126, x 1/11: 120) An ACE inhibitor/angiotensin II receptor blocker is not being taken. He sees a podiatrist.Eye  exam is current.  Hyperlipidemia This is a chronic problem. The current episode started more than 1 year ago. Lipid results: no recent labs to review. Exacerbating diseases include diabetes and obesity. Factors aggravating his hyperlipidemia include beta blockers and fatty foods. Pertinent negatives include no chest pain, myalgias or shortness of breath. Current antihyperlipidemic treatment includes statins. Compliance problems include adherence to exercise and adherence to diet.  Risk factors for coronary artery disease include dyslipidemia, diabetes mellitus, family history, obesity, male sex, hypertension and a sedentary lifestyle.  Hypertension This is a chronic problem. The current episode started more than 1 year ago. The problem has been gradually improving since onset. The problem is controlled. Pertinent negatives include no blurred vision, chest pain, headaches, neck pain, palpitations or shortness of breath. There are no associated agents to hypertension. Risk factors for coronary artery disease include dyslipidemia, diabetes mellitus, male gender, obesity, sedentary lifestyle, smoking/tobacco exposure and family history. Past treatments include beta blockers and diuretics. The current treatment provides mild improvement. Compliance problems include diet and exercise.  Hypertensive end-organ damage includes PVD.    Review of systems  Constitutional: + Minimally fluctuating body weight,  current Body mass index is 37.52 kg/m. , no fatigue, no subjective hyperthermia, no subjective hypothermia Eyes: no blurry vision, no  xerophthalmia ENT: no sore throat, no nodules palpated in throat, no dysphagia/odynophagia, no hoarseness Cardiovascular: no chest pain, no shortness of breath, no palpitations, no leg swelling Respiratory: + cough (covid ruled out), no shortness of breath Gastrointestinal: no nausea/vomiting/diarrhea Musculoskeletal: no muscle/joint aches, + walks with cane Skin: no rashes, no hyperemia Neurological: no tremors, no numbness, no tingling, no dizziness Psychiatric: no depression, no anxiety  Objective:    Wt 269 lb (122 kg)   BMI 37.52 kg/m   Wt Readings from Last 3 Encounters:  09/03/20 269 lb (122 kg)  06/28/20 269 lb 3.2 oz (122.1 kg)  05/27/20 266 lb (120.7 kg)    BP Readings from Last 3 Encounters:  06/28/20 (!) 147/68  05/27/20 117/74  05/23/20 (!) 136/57    Physical Exam- Telehealth- significantly limited due to nature of visit  Constitutional: Body mass index is 37.52 kg/m. , not in acute distress, normal state of mind Respiratory: Adequate breathing efforts, + cough  CMP ( most recent) CMP     Component Value Date/Time   NA 144 07/02/2020 0909   K 3.7 07/02/2020 0909   CL 107 (H) 07/02/2020 0909   CO2 24 07/02/2020 0909   GLUCOSE 84 07/02/2020 0909   GLUCOSE 110 (H) 06/28/2020 0943   BUN 9 07/02/2020 0909   CREATININE 1.18 07/02/2020 0909   CALCIUM 8.8 07/02/2020 0909   PROT 5.5 (L) 07/02/2020 0909   ALBUMIN 3.7 (L) 07/02/2020 0909   AST 16 07/02/2020 0909   ALT 14 07/02/2020 0909   ALKPHOS 87 07/02/2020 0909   BILITOT 0.4 07/02/2020 0909   GFRNONAA 63 07/02/2020 0909   GFRNONAA >60 06/28/2020 0943   GFRAA 73 07/02/2020 0909    Diabetic Labs (most recent): Lab Results  Component Value Date   HGBA1C 4.8 07/02/2020   HGBA1C 8.2 (H) 02/12/2020   HGBA1C >15.5 (H) 11/21/2019    Lab Results  Component Value Date   TSH 2.760 07/02/2020   TSH 1.122 09/08/2017   FREET4 1.06 07/02/2020      Assessment & Plan:   1. Uncontrolled type 2 diabetes  mellitus with hyperglycemia (HCC)  - [place] has currently uncontrolled symptomatic type 2 DM since  70 years of age.  He presents for his virtual visit today with his meter and logs to review.  His most recent A1c was 4.8% on 07/02/20, improving drastically from previous visit of 8.2%.  He denies any episodes of hypoglycemia.  His last 5 days worth of readings are as follows: 1/6: 147, x 1/7: 118, x 1/8: 168, 140 1/9: 226, 120 1/10: 126, x 1/11: 120  - Recent labs reviewed.  - I had a long discussion with him about the progressive nature of diabetes and the pathology behind its complications. -his diabetes is complicated by obesity/sedentary life, peripheral arterial disease with venous stasis and he remains at a high risk for more acute and chronic complications which include CAD, CVA, CKD, retinopathy, and neuropathy. These are all discussed in detail with him.  - Nutritional counseling repeated at each appointment due to patients tendency to fall back in to old habits.  - The patient admits there is a room for improvement in their diet and drink choices. -  Suggestion is made for the patient to avoid simple carbohydrates from their diet including Cakes, Sweet Desserts / Pastries, Ice Cream, Soda (diet and regular), Sweet Tea, Candies, Chips, Cookies, Sweet Pastries,  Store Bought Juices, Alcohol in Excess of  1-2 drinks a day, Artificial Sweeteners, Coffee Creamer, and "Sugar-free" Products. This will help patient to have stable blood glucose profile and potentially avoid unintended weight gain.   - I encouraged the patient to switch to  unprocessed or minimally processed complex starch and increased protein intake (animal or plant source), fruits, and vegetables.   - Patient is advised to stick to a routine mealtimes to eat 3 meals  a day and avoid unnecessary snacks ( to snack only to correct hypoglycemia).  - I have approached him with the following individualized plan to manage   his diabetes and patient agrees:   -Given his presentation with controlled glycemic profile, he will not need bolus insulin at this time.  I suspect his A1c was an error as he did not have any hypoglycemia near the time his A1c was drawn nor does he have anemia which can skew the results.  -He is tolerating his current dose of Tresiba 35 units SQ nightly well and is advised to continue.  He is also advised to continue Metformin 500 mg po twice daily with meals.  -He is encouraged to continue monitoring blood glucose levels twice daily, before breakfast and before bed and report blood glucose levels less than 70 or greater than 200 for 3 tests in a row.  - he is not a candidate for TZDs, SGLT2 inhibitors due to compromised peripheral circulation.    - he will be considered for incretin therapy as appropriate next visit.  - Specific targets for  A1c;  LDL, HDL,  and Triglycerides were discussed with the patient.  2) Blood Pressure /Hypertension:  His blood pressure is not controlled to target according to previous visit readings.  He does not have a way of monitoring BP at home.  He is advised to continue Lasix 20 mg po daily as needed, and Metoprolol 25 mg po twice daily.  He is advised to adopt a low sodium diet.  3) Lipids/Hyperlipidemia:  His most recent lipid panel from 07/02/20 shows controlled LDL of 78.  He is advised to continue Crestor 40 mg po daily at bedtime.  Side effects and precautions discussed with him.  4)  Weight/Diet:  His Body mass index is 37.52 kg/m.  -  clearly complicating his diabetes care.   he is  a candidate for weight loss. I discussed with him the fact that loss of 5 - 10% of his  current body weight will have the most impact on his diabetes management.  Exercise, and detailed carbohydrates information provided  -  detailed on discharge instructions.  5) Chronic Care/Health Maintenance: -he is on Statin medications and is encouraged to initiate and continue to  follow up with Ophthalmology, Dentist,  Podiatrist at least yearly or according to recommendations, and advised to stay away from smoking. I have recommended yearly flu vaccine and pneumonia vaccine at least every 5 years; moderate intensity exercise for up to 150 minutes weekly; and  sleep for at least 7 hours a day.  - he is  advised to maintain close follow up with Redmond School, MD for primary care needs, as well as his other providers for optimal and coordinated care.     I spent 30 minutes dedicated to the care of this patient on the date of this encounter to include pre-visit review of records, face-to-face time with the patient, and post visit ordering of  testing.   Please refer to Patient Instructions for Blood Glucose Monitoring and Insulin/Medications Dosing Guide"  in media tab for additional information. Please  also refer to " Patient Self Inventory" in the Media  tab for reviewed elements of pertinent patient history.  [name] participated in the discussions, expressed understanding, and voiced agreement with the above plans.  All questions were answered to his satisfaction. he is encouraged to contact clinic should he have any questions or concerns prior to his return visit. [name] participated in the discussions, expressed understanding, and voiced agreement with the above plans.  All questions were answered to his satisfaction. he is encouraged to contact clinic should he have any questions or concerns prior to his return visit.   Follow up plan: - Return in about 3 months (around 12/02/2020) for Diabetes follow up with A1c in office, No previsit labs, Bring glucometer and logs.    [name], [facility] [address] [place], [place] [address] Phone: [phone] Fax: [phone]  09/03/2020, 8:56 AM

## 2020-09-04 ENCOUNTER — Inpatient Hospital Stay (HOSPITAL_COMMUNITY): Payer: Medicare Other

## 2020-09-04 ENCOUNTER — Ambulatory Visit (HOSPITAL_COMMUNITY): Payer: Medicare Other | Admitting: Hematology

## 2020-09-11 ENCOUNTER — Ambulatory Visit: Payer: Medicare Other | Admitting: Vascular Surgery

## 2020-09-13 ENCOUNTER — Ambulatory Visit: Payer: Medicare Other | Admitting: Orthotics

## 2020-09-13 ENCOUNTER — Other Ambulatory Visit: Payer: Self-pay

## 2020-09-13 DIAGNOSIS — M2141 Flat foot [pes planus] (acquired), right foot: Secondary | ICD-10-CM | POA: Diagnosis not present

## 2020-09-13 DIAGNOSIS — E1165 Type 2 diabetes mellitus with hyperglycemia: Secondary | ICD-10-CM | POA: Diagnosis not present

## 2020-09-13 DIAGNOSIS — M2142 Flat foot [pes planus] (acquired), left foot: Secondary | ICD-10-CM | POA: Diagnosis not present

## 2020-09-21 DIAGNOSIS — E7849 Other hyperlipidemia: Secondary | ICD-10-CM | POA: Diagnosis not present

## 2020-09-21 DIAGNOSIS — Z720 Tobacco use: Secondary | ICD-10-CM | POA: Diagnosis not present

## 2020-09-21 DIAGNOSIS — I2510 Atherosclerotic heart disease of native coronary artery without angina pectoris: Secondary | ICD-10-CM | POA: Diagnosis not present

## 2020-09-21 DIAGNOSIS — E1165 Type 2 diabetes mellitus with hyperglycemia: Secondary | ICD-10-CM | POA: Diagnosis not present

## 2020-09-23 ENCOUNTER — Ambulatory Visit: Payer: Medicare Other | Admitting: Orthotics

## 2020-10-01 ENCOUNTER — Other Ambulatory Visit (HOSPITAL_COMMUNITY): Payer: Self-pay

## 2020-10-01 DIAGNOSIS — D5919 Other autoimmune hemolytic anemia: Secondary | ICD-10-CM

## 2020-10-01 DIAGNOSIS — C8330 Diffuse large B-cell lymphoma, unspecified site: Secondary | ICD-10-CM

## 2020-10-01 DIAGNOSIS — D696 Thrombocytopenia, unspecified: Secondary | ICD-10-CM

## 2020-10-02 ENCOUNTER — Inpatient Hospital Stay (HOSPITAL_COMMUNITY): Payer: Medicare Other

## 2020-10-08 ENCOUNTER — Ambulatory Visit (HOSPITAL_COMMUNITY): Payer: Medicare Other | Admitting: Oncology

## 2020-10-14 NOTE — Progress Notes (Deleted)
Cardiology Office Note:    Date:  10/14/2020   ID:  [name], DOB 06-04-1951, MRN [id number]  PCP:  Redmond School, MD  Cardiologist:  Kate Sable, MD (Inactive)   Referring MD: Redmond School, MD   No chief complaint on file. ***  History of Present Illness:    [name] is a 70 y.o. male with a hx of HTN, chronic diastolic heart failure, atrial flutter, venous insufficiency, uncontrolled DM2, anemia, and HLD. 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 He is on prednisone.   He was first diagnosed with atrial flutter RVR in the setting of PNA during a hospitalization in Jan 2019. Echo at that time showed EF 60-65% no RWMA and mild dilation of left atrium. At follow up after discharge, there was some initial confusion regarding his medications - only taking lopressor once daily.   He has atrial flutter that is rate controlled on metoprolol 25 mg BID. He is anticoagulated with 5 mg eliquis BID. He was last seen by Dr. Bronson Ing on 10/04/19 and was doing well at that time. He was euvolemic on 20 mg lasix PRN.   He presents today for 1 year follow up.    Atrial flutter  - rate controlled with metoprolol 25 mg BID - TSH WNL at the time of diagnosis - ?sleep apnea   Chronic anticoagulation This patients CHA2DS2-VASc Score and unadjusted Ischemic Stroke Rate (% per year) is equal to 4.8 % stroke rate/year from a score of 4 (age, HTN, CHF, DM) - continue eliquis 5 mg BID - no bleeding concerns   Chronic diastolic heart failure - takes 20 mg lasix PRN   Hypertension -    Hyperlipidemia 07/02/2020: Cholesterol, Total 151; HDL 47; LDL Chol Calc (NIH) 78; Triglycerides 147 - continue 40 mg crestor       Past Medical History:  Diagnosis Date  . Asthma    as child  . Diabetes mellitus without complication ([place])   . DLBCL (diffuse large B cell lymphoma) ([place]) 02/28/2009  . Edema   . Heart  failure, diastolic, chronic ([place])    patient denies  . Hyperlipidemia, mixed   . Hypertension   . IDA (iron deficiency anemia)   . Large cell lymphoma ([place]) 01/2005   autologous stem cell transplant 12/2005  . Obesity, morbid (more than 100 lbs over ideal weight or BMI > 40) (HCC)   . Venous insufficiency 04/29/2011    Past Surgical History:  Procedure Laterality Date  . BACK SURGERY  2006   T6 vertebrae removed/titanium placed  . CATARACT EXTRACTION W/PHACO Right 03/31/2019   Procedure: CATARACT EXTRACTION PHACO AND INTRAOCULAR LENS PLACEMENT (IOC);  Surgeon: Baruch Goldmann, MD;  Location: [facility];  Service: Ophthalmology;  Laterality: Right;  CDE: 3.21  . CATARACT EXTRACTION W/PHACO Left 04/14/2019   Procedure: CATARACT EXTRACTION PHACO AND INTRAOCULAR LENS PLACEMENT (IOC);  Surgeon: Baruch Goldmann, MD;  Location: [facility];  Service: Ophthalmology;  Laterality: Left;  left - pt knows to arrive at 7:45, CDE: 3.55  . COLONOSCOPY  11/24/2004   Polyps in the left colon ablated/removed as described above.  Two submucosal lesions consistent with lipomas as described above not  manipulated./ Normal rectum  . COLONOSCOPY  06/20/2012   MULTIPLE RECTAL AND COLONIC POLYPS  . COLONOSCOPY N/A 03/08/2015   RMR: Capacious, redundant colon. Multiple colonic and rectosigmoid polyps removed. ablated as described above. colonic lipoma abnormal appearing terminal ileum  likely a variant of normal). Howeverwith history  biopsies obtained.   . ESOPHAGOGASTRODUODENOSCOPY N/A 03/08/2015   RMR: Hiatal hernia Polypoid gastric mucosa with multiple gastric polyps. largest polyp removed via snare polypectomgy hemostasis clip placed at base. Status post gastric biopsy. Status post video capsule placement.   Marland Kitchen GIVENS CAPSULE STUDY N/A 03/08/2015   Procedure: GIVENS CAPSULE STUDY;  Surgeon: Daneil Dolin, MD;  [facility];  Service: Endoscopy;  Laterality: N/A;  . LIMBAL STEM CELL TRANSPLANT    . LUNG BIOPSY  6/06   . MULTIPLE TOOTH EXTRACTIONS  04/2005  . port a cath placement    . PORT-A-CATH REMOVAL  09/08/2012   Procedure: REMOVAL PORT-A-CATH;  Surgeon: Melrose Nakayama, MD;  Location: [facility];  Service: Thoracic;  Laterality: N/A;    Current Medications: No outpatient medications have been marked as taking for the 10/15/20 encounter (Appointment) with Ledora Bottcher, [place].     Allergies:   Penicillins   Social History   Socioeconomic History  . Marital status: Married    Spouse name: Not on file  . Number of children: 2  . Years of education: Not on file  . Highest education level: Not on file  Occupational History  . Occupation: disability due to back    Employer: UNEMPLOYED  Tobacco Use  . Smoking status: Former Smoker    Packs/day: 0.50    Years: 15.00    Pack years: 7.50    Quit date: 11/11/2002    Years since quitting: 17.9  . Smokeless tobacco: Never Used  Vaping Use  . Vaping Use: Never used  Substance and Sexual Activity  . Alcohol use: No    Alcohol/week: 0.0 standard drinks  . Drug use: No  . Sexual activity: Yes    Birth control/protection: None  Other Topics Concern  . Not on file  Social History Narrative   Married   No regular exercise   Social Determinants of Health   Financial Resource Strain: Low Risk   . Difficulty of Paying Living Expenses: Not hard at all  Food Insecurity: No Food Insecurity  . Worried About Charity fundraiser in the Last Year: Never true  . Ran Out of Food in the Last Year: Never true  Transportation Needs: No Transportation Needs  . Lack of Transportation (Medical): No  . Lack of Transportation (Non-Medical): No  Physical Activity: Inactive  . Days of Exercise per Week: 0 days  . Minutes of Exercise per Session: 0 min  Stress: No Stress Concern Present  . Feeling of Stress : Not at all  Social Connections: Moderately Integrated  . Frequency of Communication with Friends and Family: More than three times a week  .  Frequency of Social Gatherings with Friends and Family: Twice a week  . Attends Religious Services: More than 4 times per year  . Active Member of Clubs or Organizations: No  . Attends Archivist Meetings: Never  . Marital Status: Married     Family History: The patient's ***family history includes Cancer in his father and mother; Hyperlipidemia in his mother; Hypertension in his father and mother. There is no history of Colon cancer or Diabetes.  ROS:   Please see the history of present illness.    *** All other systems reviewed and are negative.  EKGs/Labs/Other Studies Reviewed:    The following studies were reviewed today:  Nuclear stress test 03/27/15:   There was no ST segment deviation noted during stress.  Defect 1: There is a small defect of moderate severity present in the basal inferior location. Due to soft tissue attenuation artifact.  This is a low risk study.  The left ventricular ejection fraction is normal (55-65%).    Echo 09/07/17:  - Procedure narrative: Transthoracic echocardiography. Image  quality was adequate. Intravenous contrast (Definity) was  administered.  - Left ventricle: The cavity size was normal. Wall thickness was  increased in a pattern of mild LVH. Systolic function was normal.  The estimated ejection fraction was in the range of 60% to 65%.  Wall motion was normal; there were no regional wall motion  abnormalities. The study is not technically sufficient to allow  evaluation of LV diastolic function due to atrial flutter.  - Left atrium: The atrium was mildly dilated.  - Systemic veins: IVC is dilated with normal respiratory variation.  Estimated CVP 8 mmHg.     EKG:  EKG is *** ordered today.  The ekg ordered today demonstrates ***  Recent Labs: 06/28/2020: Hemoglobin 13.2; Platelets 145 07/02/2020: ALT 14; BUN 9; Creatinine, Ser 1.18; Potassium 3.7; Sodium 144; TSH 2.760  Recent Lipid Panel     Component Value Date/Time   CHOL 151 07/02/2020 0909   TRIG 147 07/02/2020 0909   HDL 47 07/02/2020 0909   CHOLHDL 3.2 07/02/2020 0909   LDLCALC 78 07/02/2020 0909    Physical Exam:    VS:  There were no vitals taken for this visit.    Wt Readings from Last 3 Encounters:  09/03/20 269 lb (122 kg)  06/28/20 269 lb 3.2 oz (122.1 kg)  05/27/20 266 lb (120.7 kg)     GEN: *** Well nourished, well developed in no acute distress HEENT: Normal NECK: No JVD; No carotid bruits LYMPHATICS: No lymphadenopathy CARDIAC: ***RRR, no murmurs, rubs, gallops RESPIRATORY:  Clear to auscultation without rales, wheezing or rhonchi  ABDOMEN: Soft, non-tender, non-distended MUSCULOSKELETAL:  No edema; No deformity  SKIN: Warm and dry NEUROLOGIC:  Alert and oriented x 3 PSYCHIATRIC:  Normal affect   ASSESSMENT:    No diagnosis found. PLAN:    In order of problems listed above:  No diagnosis found.   Medication Adjustments/Labs and Tests Ordered: Current medicines are reviewed at length with the patient today.  Concerns regarding medicines are outlined above.  No orders of the defined types were placed in this encounter.  No orders of the defined types were placed in this encounter.   Signed, Ledora Bottcher, [place]  10/14/2020 7:54 AM    [facility]

## 2020-10-15 ENCOUNTER — Ambulatory Visit: Payer: Medicare Other | Admitting: Physician Assistant

## 2020-10-29 ENCOUNTER — Encounter: Payer: Self-pay | Admitting: Cardiology

## 2020-10-29 ENCOUNTER — Other Ambulatory Visit: Payer: Self-pay

## 2020-10-29 ENCOUNTER — Ambulatory Visit (INDEPENDENT_AMBULATORY_CARE_PROVIDER_SITE_OTHER): Payer: Medicare Other | Admitting: Cardiology

## 2020-10-29 VITALS — BP 142/74 | HR 76 | Ht 71.5 in | Wt 260.4 lb

## 2020-10-29 DIAGNOSIS — I4892 Unspecified atrial flutter: Secondary | ICD-10-CM

## 2020-10-29 DIAGNOSIS — I5032 Chronic diastolic (congestive) heart failure: Secondary | ICD-10-CM

## 2020-10-29 DIAGNOSIS — R0789 Other chest pain: Secondary | ICD-10-CM

## 2020-10-29 NOTE — Patient Instructions (Signed)
Medication Instructions:  Your physician recommends that you continue on your current medications as directed. Please refer to the Current Medication list given to you today.  *If you need a refill on your cardiac medications before your next appointment, please call your pharmacy*   Lab Work: NONE   If you have labs (blood work) drawn today and your tests are completely normal, you will receive your results only by: . MyChart Message (if you have MyChart) OR . A paper copy in the mail If you have any lab test that is abnormal or we need to change your treatment, we will call you to review the results.   Testing/Procedures: NONE    Follow-Up: At [facility], you and your health needs are our priority.  As part of our continuing mission to provide you with exceptional heart care, we have created designated Provider Care Teams.  These Care Teams include your primary Cardiologist (physician) and Advanced Practice Providers (APPs -  Physician Assistants and Nurse Practitioners) who all work together to provide you with the care you need, when you need it.  We recommend signing up for the patient portal called "MyChart".  Sign up information is provided on this After Visit Summary.  MyChart is used to connect with patients for Virtual Visits (Telemedicine).  Patients are able to view lab/test results, encounter notes, upcoming appointments, etc.  Non-urgent messages can be sent to your provider as well.   To learn more about what you can do with MyChart, go to [email or web address].    Your next appointment:   6 month(s)  The format for your next appointment:   In Person  Provider:   Jonathan Branch, MD   Other Instructions Thank you for choosing [facility]!    

## 2020-10-29 NOTE — Progress Notes (Signed)
Clinical Summary [name] is a 70 y.o.male former patient of Dr Bronson Ing, this is our first visit together. He is seen for the following medical problems.  1. Aflutter - no recent palpitations - compliant with meds - no bleeding on eliquis.  2. Chest discomfort - occurs once a month - dull pain, left sided. 1/10 in severity. Usually comes on at rest - lasts a few minutes. Better with deep breathing.  - 2016 nuclear stress test no ischemia  3. Chronic diastolic HF Jan 1700 echo LVEF 17-49%, indet diastolic function - swelling at times, resolves with prn lasix.   4. LE edema - 2021 had signs of venous reflux by [place] - has compression stockings.  - followed by vacular  5. Diffuse large B cell lymphoma - prior bone marrow transplant - history of hemolytic anemia  6. Elevated blood pressure - has not taken meds yet today.    SH: takes care of grandson during the day, 86 yo.   Past Medical History:  Diagnosis Date  . Asthma    as child  . Diabetes mellitus without complication ([place])   . DLBCL (diffuse large B cell lymphoma) ([place]) 02/28/2009  . Edema   . Heart failure, diastolic, chronic ([place])    patient denies  . Hyperlipidemia, mixed   . Hypertension   . IDA (iron deficiency anemia)   . Large cell lymphoma ([place]) 01/2005   autologous stem cell transplant 12/2005  . Obesity, morbid (more than 100 lbs over ideal weight or BMI > 40) (HCC)   . Venous insufficiency 04/29/2011     Allergies  Allergen Reactions  . Penicillins Rash    Has patient had a PCN reaction causing immediate rash, facial/tongue/throat swelling, SOB or lightheadedness with hypotension: Unknown Has patient had a PCN reaction causing severe rash involving mucus membranes or skin necrosis: Unknown Has patient had a PCN reaction that required hospitalization: Unknown Has patient had a PCN reaction occurring within the last 10 years: No If all of the above answers are "NO", then may proceed with  Cephalosporin use.      Current Outpatient Medications  Medication Sig Dispense Refill  . apixaban (ELIQUIS) 5 MG TABS tablet Take 1 tablet (5 mg total) by mouth 2 (two) times daily. 60 tablet 11  . azithromycin (ZITHROMAX) 250 MG tablet Take 250 mg by mouth as directed.    . Blood Glucose Monitoring Suppl (ONE TOUCH ULTRA 2) w/Device KIT USE TO TEST BLOOD SUGAR ONCE D UTD    . clotrimazole-betamethasone (LOTRISONE) cream Apply 1 application topically 2 (two) times daily as needed (leg infection).     Marland Kitchen ergocalciferol (VITAMIN D2) 1.25 MG (50000 UT) capsule Take 1 capsule (50,000 Units total) by mouth once a week. 16 capsule 3  . folic acid (FOLVITE) 1 MG tablet TAKE 1 TABLET(1 MG) BY MOUTH DAILY 30 tablet 4  . furosemide (LASIX) 20 MG tablet Take 20 mg by mouth daily as needed for fluid or edema.   0  . insulin degludec (TRESIBA FLEXTOUCH) 100 UNIT/ML FlexTouch Pen Inject 35 Units into the skin at bedtime. 30 mL 3  . Insulin Pen Needle (B-D ULTRAFINE III SHORT PEN) 31G X 8 MM MISC 1 each by Does not apply route daily. Use once a day as directed 100 each 3  . Lancets (ONETOUCH DELICA PLUS [id number]) MISC Use to check blood glucose twice daily, before breakfast and before bed 300 each 3  . metFORMIN (GLUCOPHAGE) 500 MG tablet  TAKE 1 TABLET(500 MG) BY MOUTH TWICE DAILY WITH A MEAL 180 tablet 0  . methylPREDNISolone (MEDROL DOSEPAK) 4 MG TBPK tablet Take by mouth.    . metoprolol tartrate (LOPRESSOR) 25 MG tablet Take 1 tablet (25 mg total) by mouth 2 (two) times daily. 180 tablet 3  . ONETOUCH ULTRA test strip USE TO TEST BLOOD SUGAR 2 TIMES A DAY 100 strip 3  . rosuvastatin (CRESTOR) 40 MG tablet Take 40 mg by mouth daily.    . vitamin B-12 (CYANOCOBALAMIN) 1000 MCG tablet Take 2,000 mcg by mouth daily.     No current facility-administered medications for this visit.     Past Surgical History:  Procedure Laterality Date  . BACK SURGERY  2006   T6 vertebrae removed/titanium placed  .  CATARACT EXTRACTION W/PHACO Right 03/31/2019   Procedure: CATARACT EXTRACTION PHACO AND INTRAOCULAR LENS PLACEMENT (IOC);  Surgeon: Baruch Goldmann, MD;  Location: [facility];  Service: Ophthalmology;  Laterality: Right;  CDE: 3.21  . CATARACT EXTRACTION W/PHACO Left 04/14/2019   Procedure: CATARACT EXTRACTION PHACO AND INTRAOCULAR LENS PLACEMENT (IOC);  Surgeon: Baruch Goldmann, MD;  Location: [facility];  Service: Ophthalmology;  Laterality: Left;  left - pt knows to arrive at 7:45, CDE: 3.55  . COLONOSCOPY  11/24/2004   Polyps in the left colon ablated/removed as described above.  Two submucosal lesions consistent with lipomas as described above not  manipulated./ Normal rectum  . COLONOSCOPY  06/20/2012   MULTIPLE RECTAL AND COLONIC POLYPS  . COLONOSCOPY N/A 03/08/2015   RMR: Capacious, redundant colon. Multiple colonic and rectosigmoid polyps removed. ablated as described above. colonic lipoma abnormal appearing terminal ileum likely a variant of normal). Howeverwith history  biopsies obtained.   . ESOPHAGOGASTRODUODENOSCOPY N/A 03/08/2015   RMR: Hiatal hernia Polypoid gastric mucosa with multiple gastric polyps. largest polyp removed via snare polypectomgy hemostasis clip placed at base. Status post gastric biopsy. Status post video capsule placement.   Marland Kitchen GIVENS CAPSULE STUDY N/A 03/08/2015   Procedure: GIVENS CAPSULE STUDY;  Surgeon: Daneil Dolin, MD;  [facility];  Service: Endoscopy;  Laterality: N/A;  . LIMBAL STEM CELL TRANSPLANT    . LUNG BIOPSY  6/06  . MULTIPLE TOOTH EXTRACTIONS  04/2005  . port a cath placement    . PORT-A-CATH REMOVAL  09/08/2012   Procedure: REMOVAL PORT-A-CATH;  Surgeon: Melrose Nakayama, MD;  Location: [facility] OR;  Service: Thoracic;  Laterality: N/A;     Allergies  Allergen Reactions  . Penicillins Rash    Has patient had a PCN reaction causing immediate rash, facial/tongue/throat swelling, SOB or lightheadedness with hypotension: Unknown Has patient had a  PCN reaction causing severe rash involving mucus membranes or skin necrosis: Unknown Has patient had a PCN reaction that required hospitalization: Unknown Has patient had a PCN reaction occurring within the last 10 years: No If all of the above answers are "NO", then may proceed with Cephalosporin use.       Family History  Problem Relation Age of Onset  . Cancer Mother        lung  . Hypertension Mother   . Hyperlipidemia Mother   . Cancer Father        prostate  . Hypertension Father   . Colon cancer Neg Hx   . Diabetes Neg Hx      Social History Mr. Patmon reports that he quit smoking about 17 years ago. He has a 7.50 pack-year smoking history. He has never used smokeless tobacco.  Mr. Blanda reports no history of alcohol use.   Review of Systems CONSTITUTIONAL: No weight loss, fever, chills, weakness or fatigue.  HEENT: Eyes: No visual loss, blurred vision, double vision or yellow sclerae.No hearing loss, sneezing, congestion, runny nose or sore throat.  SKIN: No rash or itching.  CARDIOVASCULAR: per hpi RESPIRATORY: No shortness of breath, cough or sputum.  GASTROINTESTINAL: No anorexia, nausea, vomiting or diarrhea. No abdominal pain or blood.  GENITOURINARY: No burning on urination, no polyuria NEUROLOGICAL: No headache, dizziness, syncope, paralysis, ataxia, numbness or tingling in the extremities. No change in bowel or bladder control.  MUSCULOSKELETAL: No muscle, back pain, joint pain or stiffness.  LYMPHATICS: No enlarged nodes. No history of splenectomy.  PSYCHIATRIC: No history of depression or anxiety.  ENDOCRINOLOGIC: No reports of sweating, cold or heat intolerance. No polyuria or polydipsia.  Marland Kitchen   Physical Examination Today's Vitals   10/29/20 1103  BP: (!) 142/74  Pulse: 76  SpO2: 98%  Weight: 260 lb 6.4 oz (118.1 kg)  Height: 5' 11.5" (1.816 m)   Body mass index is 35.81 kg/m.  Gen: resting comfortably, no acute distress HEENT: no scleral  icterus, pupils equal round and reactive, no palptable cervical adenopathy,  CV: RRR, no mr/g, no jvd Resp: Clear to auscultation bilaterally GI: abdomen is soft, non-tender, non-distended, normal bowel sounds, no hepatosplenomegaly MSK: extremities are warm, 1+ bilateral nonpitting edema Skin: warm, no rash Neuro:  no focal deficits Psych: appropriate affect   Diagnostic Studies Nuclear stress test 03/27/15:   There was no ST segment deviation noted during stress.  Defect 1: There is a small defect of moderate severity present in the basal inferior location. Due to soft tissue attenuation artifact.  This is a low risk study.  The left ventricular ejection fraction is normal (55-65%).    Echo 09/07/17:  - Procedure narrative: Transthoracic echocardiography. Image  quality was adequate. Intravenous contrast (Definity) was  administered.  - Left ventricle: The cavity size was normal. Wall thickness was  increased in a pattern of mild LVH. Systolic function was normal.  The estimated ejection fraction was in the range of 60% to 65%.  Wall motion was normal; there were no regional wall motion  abnormalities. The study is not technically sufficient to allow  evaluation of LV diastolic function due to atrial flutter.  - Left atrium: The atrium was mildly dilated.  - Systemic veins: IVC is dilated with normal respiratory variation.  Estimated CVP 8 mmHg.        Assessment and Plan  1. Aflutter - no symptoms, EKG today shows rate controlled flutter - continue currentmeds  2. Chest pain - noncardiac symptoms, continue to monitor  3. Chronic diastolic HF - continue diuretic - a lot of his chronic LE edema is more related to venous reflux      Arnoldo Lenis, M.D.

## 2020-10-30 ENCOUNTER — Ambulatory Visit: Payer: Medicare Other | Admitting: Podiatry

## 2020-10-31 ENCOUNTER — Other Ambulatory Visit (HOSPITAL_COMMUNITY): Payer: Self-pay | Admitting: Hematology

## 2020-11-07 ENCOUNTER — Ambulatory Visit: Payer: Medicare Other | Admitting: Vascular Surgery

## 2020-11-20 DIAGNOSIS — E1165 Type 2 diabetes mellitus with hyperglycemia: Secondary | ICD-10-CM | POA: Diagnosis not present

## 2020-11-20 DIAGNOSIS — E7849 Other hyperlipidemia: Secondary | ICD-10-CM | POA: Diagnosis not present

## 2020-11-20 DIAGNOSIS — I2510 Atherosclerotic heart disease of native coronary artery without angina pectoris: Secondary | ICD-10-CM | POA: Diagnosis not present

## 2020-11-21 ENCOUNTER — Other Ambulatory Visit: Payer: Self-pay | Admitting: Nurse Practitioner

## 2020-12-02 ENCOUNTER — Other Ambulatory Visit: Payer: Self-pay

## 2020-12-02 ENCOUNTER — Encounter: Payer: Self-pay | Admitting: Nurse Practitioner

## 2020-12-02 ENCOUNTER — Ambulatory Visit (INDEPENDENT_AMBULATORY_CARE_PROVIDER_SITE_OTHER): Payer: Medicare Other | Admitting: Nurse Practitioner

## 2020-12-02 VITALS — BP 155/75 | HR 62 | Ht 71.5 in | Wt 263.0 lb

## 2020-12-02 DIAGNOSIS — E1165 Type 2 diabetes mellitus with hyperglycemia: Secondary | ICD-10-CM | POA: Diagnosis not present

## 2020-12-02 DIAGNOSIS — I10 Essential (primary) hypertension: Secondary | ICD-10-CM | POA: Diagnosis not present

## 2020-12-02 DIAGNOSIS — E559 Vitamin D deficiency, unspecified: Secondary | ICD-10-CM | POA: Diagnosis not present

## 2020-12-02 DIAGNOSIS — E782 Mixed hyperlipidemia: Secondary | ICD-10-CM | POA: Diagnosis not present

## 2020-12-02 LAB — POCT GLYCOSYLATED HEMOGLOBIN (HGB A1C): HbA1c, POC (controlled diabetic range): 5.3 % (ref 0.0–7.0)

## 2020-12-02 NOTE — Progress Notes (Signed)
12/02/2020, 8:39 AM  Endocrinology follow-up note     Subjective:    Patient ID: [name], male    DOB: October 12, 1950.  [name] is being seen in follow-up after he was seen in consultation for management of currently uncontrolled symptomatic diabetes requested by  Redmond School, MD.   Past Medical History:  Diagnosis Date  . Asthma    as child  . Diabetes mellitus without complication ([place])   . DLBCL (diffuse large B cell lymphoma) ([place]) 02/28/2009  . Edema   . Heart failure, diastolic, chronic ([place])    patient denies  . Hyperlipidemia, mixed   . Hypertension   . IDA (iron deficiency anemia)   . Large cell lymphoma ([place]) 01/2005   autologous stem cell transplant 12/2005  . Obesity, morbid (more than 100 lbs over ideal weight or BMI > 40) ([facility])   . Venous insufficiency 04/29/2011    Past Surgical History:  Procedure Laterality Date  . BACK SURGERY  2006   T6 vertebrae removed/titanium placed  . CATARACT EXTRACTION W/PHACO Right 03/31/2019   Procedure: CATARACT EXTRACTION PHACO AND INTRAOCULAR LENS PLACEMENT (IOC);  Surgeon: Baruch Goldmann, MD;  Location: [facility];  Service: Ophthalmology;  Laterality: Right;  CDE: 3.21  . CATARACT EXTRACTION W/PHACO Left 04/14/2019   Procedure: CATARACT EXTRACTION PHACO AND INTRAOCULAR LENS PLACEMENT (IOC);  Surgeon: Baruch Goldmann, MD;  Location: [facility];  Service: Ophthalmology;  Laterality: Left;  left - pt knows to arrive at 7:45, CDE: 3.55  . COLONOSCOPY  11/24/2004   Polyps in the left colon ablated/removed as described above.  Two submucosal lesions consistent with lipomas as described above not  manipulated./ Normal rectum  . COLONOSCOPY  06/20/2012   MULTIPLE RECTAL AND COLONIC POLYPS  . COLONOSCOPY N/A 03/08/2015   RMR: Capacious, redundant colon. Multiple colonic and rectosigmoid polyps removed. ablated as described above. colonic lipoma abnormal appearing  terminal ileum likely a variant of normal). Howeverwith history  biopsies obtained.   . ESOPHAGOGASTRODUODENOSCOPY N/A 03/08/2015   RMR: Hiatal hernia Polypoid gastric mucosa with multiple gastric polyps. largest polyp removed via snare polypectomgy hemostasis clip placed at base. Status post gastric biopsy. Status post video capsule placement.   Marland Kitchen GIVENS CAPSULE STUDY N/A 03/08/2015   Procedure: GIVENS CAPSULE STUDY;  Surgeon: Daneil Dolin, MD;  Location: [facility];  Service: Endoscopy;  Laterality: N/A;  . LIMBAL STEM CELL TRANSPLANT    . LUNG BIOPSY  6/06  . MULTIPLE TOOTH EXTRACTIONS  04/2005  . port a cath placement    . PORT-A-CATH REMOVAL  09/08/2012   Procedure: REMOVAL PORT-A-CATH;  Surgeon: Melrose Nakayama, MD;  Location: [facility] OR;  Service: Thoracic;  Laterality: N/A;    Social History   Socioeconomic History  . Marital status: Married    Spouse name: Not on file  . Number of children: 2  . Years of education: Not on file  . Highest education level: Not on file  Occupational History  . Occupation: disability due to back    Employer: UNEMPLOYED  Tobacco Use  . Smoking status: Former Smoker    Packs/day: 0.50    Years: 15.00    Pack years:  7.50    Quit date: 11/11/2002    Years since quitting: 18.0  . Smokeless tobacco: Never Used  Vaping Use  . Vaping Use: Never used  Substance and Sexual Activity  . Alcohol use: No    Alcohol/week: 0.0 standard drinks  . Drug use: No  . Sexual activity: Yes    Birth control/protection: None  Other Topics Concern  . Not on file  Social History Narrative   Married   No regular exercise   Social Determinants of Health   Financial Resource Strain: Low Risk   . Difficulty of Paying Living Expenses: Not hard at all  Food Insecurity: No Food Insecurity  . Worried About Charity fundraiser in the Last Year: Never true  . Ran Out of Food in the Last Year: Never true  Transportation Needs: No Transportation Needs  . Lack of  Transportation (Medical): No  . Lack of Transportation (Non-Medical): No  Physical Activity: Inactive  . Days of Exercise per Week: 0 days  . Minutes of Exercise per Session: 0 min  Stress: No Stress Concern Present  . Feeling of Stress : Not at all  Social Connections: Moderately Integrated  . Frequency of Communication with Friends and Family: More than three times a week  . Frequency of Social Gatherings with Friends and Family: Twice a week  . Attends Religious Services: More than 4 times per year  . Active Member of Clubs or Organizations: No  . Attends Archivist Meetings: Never  . Marital Status: Married    Family History  Problem Relation Age of Onset  . Cancer Mother        lung  . Hypertension Mother   . Hyperlipidemia Mother   . Cancer Father        prostate  . Hypertension Father   . Colon cancer Neg Hx   . Diabetes Neg Hx     Outpatient Encounter Medications as of 12/02/2020  Medication Sig  . apixaban (ELIQUIS) 5 MG TABS tablet Take 1 tablet (5 mg total) by mouth 2 (two) times daily.  . Blood Glucose Monitoring Suppl (ONE TOUCH ULTRA 2) w/Device KIT USE TO TEST BLOOD SUGAR ONCE D UTD  . clotrimazole-betamethasone (LOTRISONE) cream Apply 1 application topically 2 (two) times daily as needed (leg infection).   Marland Kitchen ergocalciferol (VITAMIN D2) 1.25 MG (50000 UT) capsule Take 1 capsule (50,000 Units total) by mouth once a week.  . folic acid (FOLVITE) 1 MG tablet TAKE 1 TABLET(1 MG) BY MOUTH DAILY  . furosemide (LASIX) 20 MG tablet Take 20 mg by mouth daily as needed for fluid or edema.   . insulin degludec (TRESIBA FLEXTOUCH) 100 UNIT/ML FlexTouch Pen Inject 35 Units into the skin at bedtime.  . Insulin Pen Needle (B-D ULTRAFINE III SHORT PEN) 31G X 8 MM MISC 1 each by Does not apply route daily. Use once a day as directed  . Lancets (ONETOUCH DELICA PLUS [id number]) MISC Use to check blood glucose twice daily, before breakfast and before bed  . metFORMIN  (GLUCOPHAGE) 500 MG tablet TAKE 1 TABLET(500 MG) BY MOUTH TWICE DAILY WITH A MEAL  . metoprolol tartrate (LOPRESSOR) 25 MG tablet Take 1 tablet (25 mg total) by mouth 2 (two) times daily.  Glory Rosebush ULTRA test strip TEST TWICE DAILY  . rosuvastatin (CRESTOR) 40 MG tablet Take 40 mg by mouth daily.  . vitamin B-12 (CYANOCOBALAMIN) 1000 MCG tablet Take 2,000 mcg by mouth daily.   No facility-administered encounter  medications on file as of 12/02/2020.    ALLERGIES: Allergies  Allergen Reactions  . Penicillins Rash    Has patient had a PCN reaction causing immediate rash, facial/tongue/throat swelling, SOB or lightheadedness with hypotension: Unknown Has patient had a PCN reaction causing severe rash involving mucus membranes or skin necrosis: Unknown Has patient had a PCN reaction that required hospitalization: Unknown Has patient had a PCN reaction occurring within the last 10 years: No If all of the above answers are "NO", then may proceed with Cephalosporin use.     VACCINATION STATUS: Immunization History  Administered Date(s) Administered  . Fluad Quad(high Dose 65+) 08/04/2019, 06/28/2020  . Influenza Split 06/08/2012  . Influenza,inj,Quad PF,6+ Mos 07/14/2013, 06/20/2015  . Influenza-Unspecified 07/15/2011, 06/02/2012  . Moderna Sars-Covid-2 Vaccination 11/03/2019, 12/05/2019    Diabetes He presents for his follow-up diabetic visit. He has type 2 diabetes mellitus. Onset time: He was diagnosed in March 2021 after at least 2 years of exposure to steroids related to his B-cell lymphoma which required autologous stem cell transplant bone marrow. His disease course has been stable. There are no hypoglycemic associated symptoms. Pertinent negatives for hypoglycemia include no confusion, headaches, pallor or seizures. Pertinent negatives for diabetes include no blurred vision, no chest pain, no fatigue, no polydipsia, no polyphagia, no polyuria and no weakness. There are no  hypoglycemic complications. Symptoms are stable. Diabetic complications include nephropathy and PVD. Risk factors for coronary artery disease include diabetes mellitus, dyslipidemia, family history, male sex, obesity, tobacco exposure and sedentary lifestyle. Current diabetic treatment includes insulin injections and oral agent (monotherapy). He is compliant with treatment most of the time. His weight is fluctuating minimally. He is following a generally unhealthy diet. When asked about meal planning, he reported none. He has not had a previous visit with a dietitian. He rarely participates in exercise. His home blood glucose trend is fluctuating minimally. His breakfast blood glucose range is generally 90-110 mg/dl. His bedtime blood glucose range is generally 110-130 mg/dl. (He presents today with his meter and logs showing stable glycemic profile, at goal both fasting and postprandially.  His POCT A1c today is 5.3%, increasing some from last visit of 4.8%.  He is known to have discrepancy between his glucose numbers and his A1c.  He denies any significant episodes of hypoglycemia.) An ACE inhibitor/angiotensin II receptor blocker is not being taken. He sees a podiatrist.Eye exam is current.  Hyperlipidemia This is a chronic problem. The current episode started more than 1 year ago. The problem is controlled. Recent lipid tests were reviewed and are normal. Exacerbating diseases include chronic renal disease, diabetes and obesity. Factors aggravating his hyperlipidemia include beta blockers and fatty foods. Pertinent negatives include no chest pain, myalgias or shortness of breath. Current antihyperlipidemic treatment includes statins. The current treatment provides moderate improvement of lipids. Compliance problems include adherence to exercise and adherence to diet.  Risk factors for coronary artery disease include dyslipidemia, diabetes mellitus, family history, obesity, male sex, hypertension and a  sedentary lifestyle.  Hypertension This is a chronic problem. The current episode started more than 1 year ago. The problem has been gradually improving since onset. The problem is controlled. Pertinent negatives include no blurred vision, chest pain, headaches, neck pain, palpitations or shortness of breath. There are no associated agents to hypertension. Risk factors for coronary artery disease include dyslipidemia, diabetes mellitus, male gender, obesity, sedentary lifestyle, smoking/tobacco exposure and family history. Past treatments include beta blockers and diuretics. The current treatment provides mild improvement.  Compliance problems include diet and exercise.  Hypertensive end-organ damage includes kidney disease and PVD. Identifiable causes of hypertension include chronic renal disease.    Review of systems  Constitutional: + Minimally fluctuating body weight,  current Body mass index is 36.17 kg/m. , no fatigue, no subjective hyperthermia, no subjective hypothermia Eyes: no blurry vision, no xerophthalmia ENT: no sore throat, no nodules palpated in throat, no dysphagia/odynophagia, no hoarseness Cardiovascular: no chest pain, no shortness of breath, no palpitations, no leg swelling Respiratory: no cough, no shortness of breath Gastrointestinal: no nausea/vomiting/diarrhea Musculoskeletal: no muscle/joint aches, walks with cane Skin: no rashes, no hyperemia Neurological: no tremors, no numbness, no tingling, no dizziness Psychiatric: no depression, no anxiety  Objective:    BP (!) 155/75 (BP Location: Right Arm, Patient Position: Sitting)   Pulse 62   Ht 5' 11.5" (1.816 m)   Wt 263 lb (119.3 kg)   BMI 36.17 kg/m   Wt Readings from Last 3 Encounters:  12/02/20 263 lb (119.3 kg)  10/29/20 260 lb 6.4 oz (118.1 kg)  09/03/20 269 lb (122 kg)    BP Readings from Last 3 Encounters:  12/02/20 (!) 155/75  10/29/20 (!) 142/74  06/28/20 (!) 147/68     Physical Exam-  Limited  Constitutional:  Body mass index is 36.17 kg/m. , not in acute distress, normal state of mind Eyes:  EOMI, no exophthalmos Neck: Supple Cardiovascular: RRR, no murmers, rubs, or gallops, no edema Respiratory: Adequate breathing efforts, no crackles, rales, rhonchi, or wheezing Musculoskeletal: no gross deformities, strength intact in all four extremities, no gross restriction of joint movements, walks with cane Skin:  no rashes, no hyperemia Neurological: no tremor with outstretched hands    CMP ( most recent) CMP     Component Value Date/Time   NA 144 07/02/2020 0909   K 3.7 07/02/2020 0909   CL 107 (H) 07/02/2020 0909   CO2 24 07/02/2020 0909   GLUCOSE 84 07/02/2020 0909   GLUCOSE 110 (H) 06/28/2020 0943   BUN 9 07/02/2020 0909   CREATININE 1.18 07/02/2020 0909   CALCIUM 8.8 07/02/2020 0909   PROT 5.5 (L) 07/02/2020 0909   ALBUMIN 3.7 (L) 07/02/2020 0909   AST 16 07/02/2020 0909   ALT 14 07/02/2020 0909   ALKPHOS 87 07/02/2020 0909   BILITOT 0.4 07/02/2020 0909   GFRNONAA 63 07/02/2020 0909   GFRNONAA >60 06/28/2020 0943   GFRAA 73 07/02/2020 0909    Diabetic Labs (most recent): Lab Results  Component Value Date   HGBA1C 5.3 12/02/2020   HGBA1C 4.8 07/02/2020   HGBA1C 8.2 (H) 02/12/2020    Lab Results  Component Value Date   TSH 2.760 07/02/2020   TSH 1.122 09/08/2017   FREET4 1.06 07/02/2020      Assessment & Plan:   1) Uncontrolled type 2 diabetes mellitus with hyperglycemia (HCC)  - [place] has currently uncontrolled symptomatic type 2 DM since  70 years of age.  He presents today with his meter and logs showing stable glycemic profile, at goal both fasting and postprandially.  His POCT A1c today is 5.3%, increasing some from last visit of 4.8%.  He is known to have discrepancy between his glucose numbers and his A1c.  He denies any significant episodes of hypoglycemia.  - Recent labs reviewed.  - I had a long discussion with him  about the progressive nature of diabetes and the pathology behind its complications. -his diabetes is complicated by obesity/sedentary life, peripheral arterial disease  with venous stasis and he remains at a high risk for more acute and chronic complications which include CAD, CVA, CKD, retinopathy, and neuropathy. These are all discussed in detail with him.  - Nutritional counseling repeated at each appointment due to patients tendency to fall back in to old habits.  - The patient admits there is a room for improvement in their diet and drink choices. -  Suggestion is made for the patient to avoid simple carbohydrates from their diet including Cakes, Sweet Desserts / Pastries, Ice Cream, Soda (diet and regular), Sweet Tea, Candies, Chips, Cookies, Sweet Pastries,  Store Bought Juices, Alcohol in Excess of  1-2 drinks a day, Artificial Sweeteners, Coffee Creamer, and "Sugar-free" Products. This will help patient to have stable blood glucose profile and potentially avoid unintended weight gain.   - I encouraged the patient to switch to  unprocessed or minimally processed complex starch and increased protein intake (animal or plant source), fruits, and vegetables.   - Patient is advised to stick to a routine mealtimes to eat 3 meals  a day and avoid unnecessary snacks ( to snack only to correct hypoglycemia).  - I have approached him with the following individualized plan to manage  his diabetes and patient agrees:   -Given his presentation with controlled glycemic profile, he will not need bolus insulin at this time.    -He is tolerating his current dose of Tresiba 35 units SQ nightly well and is advised to continue.  He is also advised to continue Metformin 500 mg po twice daily with meals.  -He is encouraged to continue monitoring blood glucose levels twice daily, before breakfast and before bed and report blood glucose levels less than 70 or greater than 200 for 3 tests in a row.  - he is not a  candidate for TZDs, SGLT2 inhibitors due to compromised peripheral circulation.    - he will be considered for incretin therapy as appropriate next visit.  - Specific targets for  A1c;  LDL, HDL,  and Triglycerides were discussed with the patient.  2) Blood Pressure /Hypertension:  His blood pressure is controlled to target.  He is advised to continue Lasix 20 mg po daily as needed, and Metoprolol 25 mg po twice daily.  He is advised to adopt a low sodium diet.  3) Lipids/Hyperlipidemia:  His most recent lipid panel from 07/02/20 shows controlled LDL of 78.  He is advised to continue Crestor 40 mg po daily at bedtime.  Side effects and precautions discussed with him.  4)  Weight/Diet:  His Body mass index is 36.17 kg/m.  -   clearly complicating his diabetes care.   he is  a candidate for weight loss. I discussed with him the fact that loss of 5 - 10% of his  current body weight will have the most impact on his diabetes management.  Exercise, and detailed carbohydrates information provided  -  detailed on discharge instructions.  5) Chronic Care/Health Maintenance: -he is on Statin medications and is encouraged to initiate and continue to follow up with Ophthalmology, Dentist,  Podiatrist at least yearly or according to recommendations, and advised to stay away from smoking. I have recommended yearly flu vaccine and pneumonia vaccine at least every 5 years; moderate intensity exercise for up to 150 minutes weekly; and  sleep for at least 7 hours a day.  - he is  advised to maintain close follow up with Redmond School, MD for primary care needs, as well as  his other providers for optimal and coordinated care.       I spent 35 minutes in the care of the patient today including review of labs from [facility], Lipids, Thyroid Function, Hematology (current and previous including abstractions from other facilities); face-to-face time discussing  his blood glucose readings/logs, discussing hypoglycemia  and hyperglycemia episodes and symptoms, medications doses, his options of short and long term treatment based on the latest standards of care / guidelines;  discussion about incorporating lifestyle medicine;  and documenting the encounter.    Please refer to Patient Instructions for Blood Glucose Monitoring and Insulin/Medications Dosing Guide"  in media tab for additional information. Please  also refer to " Patient Self Inventory" in the Media  tab for reviewed elements of pertinent patient history.  [name] participated in the discussions, expressed understanding, and voiced agreement with the above plans.  All questions were answered to his satisfaction. he is encouraged to contact clinic should he have any questions or concerns prior to his return visit.   Follow up plan: - Return in about 4 months (around 04/03/2021) for Diabetes follow up with A1c in office, Previsit labs, Bring glucometer and logs.    [name], [facility] [address] [place], [place] [address] Phone: [phone] Fax: [phone] 12/02/2020, 8:39 AM

## 2020-12-02 NOTE — Patient Instructions (Signed)

## 2020-12-05 ENCOUNTER — Ambulatory Visit: Payer: Medicare Other | Admitting: Vascular Surgery

## 2020-12-21 DIAGNOSIS — E1165 Type 2 diabetes mellitus with hyperglycemia: Secondary | ICD-10-CM | POA: Diagnosis not present

## 2020-12-21 DIAGNOSIS — E7849 Other hyperlipidemia: Secondary | ICD-10-CM | POA: Diagnosis not present

## 2020-12-21 DIAGNOSIS — I2510 Atherosclerotic heart disease of native coronary artery without angina pectoris: Secondary | ICD-10-CM | POA: Diagnosis not present

## 2021-01-21 DIAGNOSIS — E7849 Other hyperlipidemia: Secondary | ICD-10-CM | POA: Diagnosis not present

## 2021-01-21 DIAGNOSIS — E1165 Type 2 diabetes mellitus with hyperglycemia: Secondary | ICD-10-CM | POA: Diagnosis not present

## 2021-01-23 ENCOUNTER — Ambulatory Visit: Payer: Medicare Other | Admitting: Vascular Surgery

## 2021-01-27 ENCOUNTER — Other Ambulatory Visit: Payer: Self-pay | Admitting: Nurse Practitioner

## 2021-02-04 DIAGNOSIS — Z0001 Encounter for general adult medical examination with abnormal findings: Secondary | ICD-10-CM | POA: Diagnosis not present

## 2021-02-04 DIAGNOSIS — I700 Atherosclerosis of aorta: Secondary | ICD-10-CM | POA: Diagnosis not present

## 2021-02-13 ENCOUNTER — Other Ambulatory Visit: Payer: Self-pay

## 2021-02-13 ENCOUNTER — Ambulatory Visit: Payer: Medicare Other | Admitting: Vascular Surgery

## 2021-02-13 ENCOUNTER — Encounter: Payer: Self-pay | Admitting: Vascular Surgery

## 2021-02-13 VITALS — BP 142/70 | HR 70 | Temp 97.9°F | Resp 20 | Ht 71.5 in | Wt 258.0 lb

## 2021-02-13 DIAGNOSIS — I83813 Varicose veins of bilateral lower extremities with pain: Secondary | ICD-10-CM

## 2021-02-13 NOTE — Progress Notes (Signed)
Patient is a 70 year old male who returns for follow-up today.  He has had several years of chronic leg swelling.  He was last seen in our [facility] clinic in October 2021.  At that time it was recommended that he wear thigh-high compression stockings for control of his symptoms.  He did have evidence of reflux in the lesser saphenous and greater saphenous at that time.  He admits that he has not really worn his compression stockings at all.  He said it takes too long to put them on.  He did state that a couple of times when he did wear them they did seem to improve his symptoms.  He ambulates with a cane.  He is trying to lose weight and has lost 7 pounds over the last 2 months.  Other chronic medical problems include diabetes, hyperlipidemia, hypertension, atrial flutter (on Eliquis).  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   autologous stem cell transplant 12/2005   Obesity, morbid (more than 100 lbs over ideal weight or BMI > 40) ([place])    Venous insufficiency 04/29/2011    Past Surgical History:  Procedure Laterality Date   BACK SURGERY  2006   T6 vertebrae removed/titanium placed   CATARACT EXTRACTION W/PHACO Right 03/31/2019   Procedure: CATARACT EXTRACTION PHACO AND INTRAOCULAR LENS PLACEMENT ([place]);  Surgeon: Baruch Goldmann, MD;  Location: [facility];  Service: Ophthalmology;  Laterality: Right;  CDE: 3.21   CATARACT EXTRACTION W/PHACO Left 04/14/2019   Procedure: CATARACT EXTRACTION PHACO AND INTRAOCULAR LENS PLACEMENT ([facility]);  Surgeon: Baruch Goldmann, MD;  Location: [facility];  Service: Ophthalmology;  Laterality: Left;  left - pt knows to arrive at 7:45, CDE: 3.55   COLONOSCOPY  11/24/2004   Polyps in  the left colon ablated/removed as described above.  Two submucosal lesions consistent with lipomas as described above not  manipulated./ Normal rectum   COLONOSCOPY  06/20/2012   MULTIPLE RECTAL AND COLONIC POLYPS   COLONOSCOPY N/A 03/08/2015   RMR: Capacious, redundant colon. Multiple colonic and rectosigmoid polyps removed. ablated as described above. colonic lipoma abnormal appearing terminal ileum likely a variant of normal). Howeverwith history  biopsies obtained.    ESOPHAGOGASTRODUODENOSCOPY N/A 03/08/2015   RMR: Hiatal hernia Polypoid gastric mucosa with multiple gastric polyps. largest polyp removed via snare polypectomgy hemostasis clip placed at base. Status post gastric biopsy. Status post video capsule placement.    GIVENS CAPSULE STUDY N/A 03/08/2015   Procedure: GIVENS CAPSULE STUDY;  Surgeon: Daneil Dolin, MD;  Location: [facility];  Service: Endoscopy;  Laterality: N/A;   LIMBAL STEM CELL TRANSPLANT     LUNG BIOPSY  6/06   MULTIPLE TOOTH EXTRACTIONS  04/2005   port a cath placement     PORT-A-CATH REMOVAL  09/08/2012   Procedure: REMOVAL PORT-A-CATH;  Surgeon: Melrose Nakayama, MD;  Location: [facility];  Service: Thoracic;  Laterality: N/A;   Current Outpatient Medications on File Prior to Visit  Medication Sig Dispense Refill   apixaban (ELIQUIS) 5 MG TABS tablet Take 1 tablet (5 mg total) by mouth 2 (two) times daily. 60 tablet 11   Blood Glucose  Monitoring Suppl (ONE TOUCH ULTRA 2) w/Device KIT USE TO TEST BLOOD SUGAR ONCE D UTD     clotrimazole-betamethasone (LOTRISONE) cream Apply 1 application topically 2 (two) times daily as needed (leg infection).      ergocalciferol (VITAMIN D2) 1.25 MG (50000 UT) capsule Take 1 capsule (50,000 Units total) by mouth once a week. 16 capsule 3   folic acid (FOLVITE) 1 MG tablet TAKE 1 TABLET(1 MG) BY MOUTH DAILY 30 tablet 4   furosemide (LASIX) 20 MG tablet Take 20 mg by mouth daily as needed for fluid or edema.   0   insulin degludec  (TRESIBA FLEXTOUCH) 100 UNIT/ML FlexTouch Pen Inject 35 Units into the skin at bedtime. 30 mL 3   Insulin Pen Needle (B-D ULTRAFINE III SHORT PEN) 31G X 8 MM MISC 1 each by Does not apply route daily. Use once a day as directed 100 each 3   Lancets (ONETOUCH DELICA PLUS [id number]) MISC Use to check blood glucose twice daily, before breakfast and before bed 300 each 3   metFORMIN (GLUCOPHAGE) 500 MG tablet TAKE 1 TABLET(500 MG) BY MOUTH TWICE DAILY WITH A MEAL 180 tablet 0   ONETOUCH ULTRA test strip TEST TWICE DAILY 100 strip 3   rosuvastatin (CRESTOR) 40 MG tablet Take 40 mg by mouth daily.     vitamin B-12 (CYANOCOBALAMIN) 1000 MCG tablet Take 2,000 mcg by mouth daily.     metoprolol tartrate (LOPRESSOR) 25 MG tablet Take 1 tablet (25 mg total) by mouth 2 (two) times daily. 180 tablet 3   No current facility-administered medications on file prior to visit.   Social History   Socioeconomic History   Marital status: Married    Spouse name: Not on file   Number of children: 2   Years of education: Not on file   Highest education level: Not on file  Occupational History   Occupation: disability due to back    Employer: UNEMPLOYED  Tobacco Use   Smoking status: Former    Packs/day: 0.50    Years: 15.00    Pack years: 7.50    Types: Cigarettes    Quit date: 11/11/2002    Years since quitting: 18.2   Smokeless tobacco: Never  Vaping Use   Vaping Use: Never used  Substance and Sexual Activity   Alcohol use: No    Alcohol/week: 0.0 standard drinks   Drug use: No   Sexual activity: Yes    Birth control/protection: None  Other Topics Concern   Not on file  Social History Narrative   Married   No regular exercise   Social Determinants of Health   Financial Resource Strain: Low Risk    Difficulty of Paying Living Expenses: Not hard at all  Food Insecurity: No Food Insecurity   Worried About Charity fundraiser in the Last Year: Never true   Ran Out of Food in the Last Year:  Never true  Transportation Needs: No Transportation Needs   Lack of Transportation (Medical): No   Lack of Transportation (Non-Medical): No  Physical Activity: Inactive   Days of Exercise per Week: 0 days   Minutes of Exercise per Session: 0 min  Stress: No Stress Concern Present   Feeling of Stress : Not at all  Social Connections: Moderately Integrated   Frequency of Communication with Friends and Family: More than three times a week   Frequency of Social Gatherings with Friends and Family: Twice a week   Attends Religious Services: More than  4 times per year   Active Member of Clubs or Organizations: No   Attends Archivist Meetings: Never   Marital Status: Married  Human resources officer Violence: Not At Risk   Fear of Current or Ex-Partner: No   Emotionally Abused: No   Physically Abused: No   Sexually Abused: No    Physical exam:  Vitals:   02/13/21 0913  BP: (!) 142/70  Pulse: 70  Resp: 20  Temp: 97.9 F (36.6 C)  SpO2: 98%  Weight: 258 lb (117 kg)  Height: 5' 11.5" (1.816 m)    Extremities: 2+ dorsalis pedis pulses bilaterally.  Thickened woody type skin from the knee down to the ankle bilaterally.  No ulceration  Abdomen: Obese  Data: I reviewed the patient's duplex ultrasound from May 27, 2020 which shows a 5 mm greater saphenous vein bilaterally.  The lesser saphenous vein was also 5 to 6 mm diameter bilaterally.  He also had diffuse deep vein reflux from the femoral vein all the way through the popliteal vein bilaterally.  Assessment: Deep and superficial venous reflux bilateral lower extremities.  Most of the superficial venous reflux appears to be concentrated in the lesser saphenous vein bilaterally.  However, he does have a dilated greater saphenous bilaterally as well.  Unfortunately he has not really been compliant wearing his compression stockings.  I discussed with him today that with his 2 different components to his venous disease  compression stockings are definitely going to be mainstay of therapy for him.  I did discuss with him that we could consider laser ablation of his greater and lesser saphenous veins bilaterally.  However, due to his diffuse deep vein reflux certainly compression therapy is going to be mandatory as well.  He and I had a frank discussion about what bothers him more his leg swelling or placing compression stockings on his lower extremities.  He states he definitely does not like the leg swelling.  But he also does not like the compression stockings.  He will think more about being compliant with these in the future.  He was also counseled on weight loss and leg elevation.  Plan: Patient was fitted today for bilateral lower extremity knee-high compression stockings to see if we can increase compliance.  He will follow-up in 3 months time with Dr. Doren Custard for consideration for laser ablation.  Ruta Hinds, MD [facility] Office: [phone]

## 2021-02-20 DIAGNOSIS — I2510 Atherosclerotic heart disease of native coronary artery without angina pectoris: Secondary | ICD-10-CM | POA: Diagnosis not present

## 2021-02-20 DIAGNOSIS — E1165 Type 2 diabetes mellitus with hyperglycemia: Secondary | ICD-10-CM | POA: Diagnosis not present

## 2021-02-20 DIAGNOSIS — E782 Mixed hyperlipidemia: Secondary | ICD-10-CM | POA: Diagnosis not present

## 2021-02-26 DIAGNOSIS — M7989 Other specified soft tissue disorders: Secondary | ICD-10-CM

## 2021-03-31 DIAGNOSIS — E1165 Type 2 diabetes mellitus with hyperglycemia: Secondary | ICD-10-CM | POA: Diagnosis not present

## 2021-04-01 LAB — COMPREHENSIVE METABOLIC PANEL
ALT: 26 IU/L (ref 0–44)
AST: 23 IU/L (ref 0–40)
Albumin/Globulin Ratio: 2.6 — ABNORMAL HIGH (ref 1.2–2.2)
Albumin: 4.2 g/dL (ref 3.8–4.8)
Alkaline Phosphatase: 80 IU/L (ref 44–121)
BUN/Creatinine Ratio: 12 (ref 10–24)
BUN: 17 mg/dL (ref 8–27)
Bilirubin Total: 0.4 mg/dL (ref 0.0–1.2)
CO2: 23 mmol/L (ref 20–29)
Calcium: 9.4 mg/dL (ref 8.6–10.2)
Chloride: 103 mmol/L (ref 96–106)
Creatinine, Ser: 1.44 mg/dL — ABNORMAL HIGH (ref 0.76–1.27)
Globulin, Total: 1.6 g/dL (ref 1.5–4.5)
Glucose: 96 mg/dL (ref 65–99)
Potassium: 4.3 mmol/L (ref 3.5–5.2)
Sodium: 145 mmol/L — ABNORMAL HIGH (ref 134–144)
Total Protein: 5.8 g/dL — ABNORMAL LOW (ref 6.0–8.5)
eGFR: 53 mL/min/{1.73_m2} — ABNORMAL LOW (ref >59)

## 2021-04-02 ENCOUNTER — Other Ambulatory Visit (HOSPITAL_COMMUNITY): Payer: Self-pay | Admitting: Hematology

## 2021-04-02 NOTE — Patient Instructions (Signed)
Advice for Weight Management  -For most of us the best way to lose weight is by diet management. Generally speaking, diet management means consuming less calories intentionally which over time brings about progressive weight loss.  This can be achieved more effectively by restricting carbohydrate consumption to the minimum possible.  So, it is critically important to know your numbers: how much calorie you are consuming and how much calorie you need. More importantly, our carbohydrates sources should be unprocessed or minimally processed complex starch food items.   Sometimes, it is important to balance nutrition by increasing protein intake (animal or plant source), fruits, and vegetables.  -Sticking to a routine mealtime to eat 3 meals a day and avoiding unnecessary snacks is shown to have a big role in weight control. Under normal circumstances, the only time we lose real weight is when we are hungry, so allow hunger to take place- hunger means no food between meal times, only water.  It is not advisable to starve.   -It is better to avoid simple carbohydrates including: Cakes, Sweet Desserts, Ice Cream, Soda (diet and regular), Sweet Tea, Candies, Chips, Cookies, Store Bought Juices, Alcohol in Excess of  1-2 drinks a day, Artificial Sweeteners, Doughnuts, Coffee Creamers, "Sugar-free" Products, etc, etc.  This is not a complete list.....    -Consulting with certified diabetes educators is proven to provide you with the most accurate and current information on diet.  Also, you may be  interested in discussing diet options/exchanges , we can schedule a visit with [name], RDN, CDE for individualized nutrition education.  -Exercise: If you are able: 30 -60 minutes a day ,4 days a week, or 150 minutes a week.  The longer the better.  Combine stretch, strength, and aerobic activities.  If you were told in the past that you have high risk for cardiovascular diseases, you may seek evaluation by  your heart doctor prior to initiating moderate to intense exercise programs.    

## 2021-04-03 ENCOUNTER — Other Ambulatory Visit: Payer: Self-pay

## 2021-04-03 ENCOUNTER — Ambulatory Visit (INDEPENDENT_AMBULATORY_CARE_PROVIDER_SITE_OTHER): Payer: Medicare Other | Admitting: Nurse Practitioner

## 2021-04-03 ENCOUNTER — Encounter: Payer: Self-pay | Admitting: Nurse Practitioner

## 2021-04-03 VITALS — BP 151/86 | HR 69 | Ht 71.5 in | Wt 260.0 lb

## 2021-04-03 DIAGNOSIS — I10 Essential (primary) hypertension: Secondary | ICD-10-CM

## 2021-04-03 DIAGNOSIS — E559 Vitamin D deficiency, unspecified: Secondary | ICD-10-CM | POA: Diagnosis not present

## 2021-04-03 DIAGNOSIS — E782 Mixed hyperlipidemia: Secondary | ICD-10-CM | POA: Diagnosis not present

## 2021-04-03 DIAGNOSIS — E1165 Type 2 diabetes mellitus with hyperglycemia: Secondary | ICD-10-CM | POA: Diagnosis not present

## 2021-04-03 LAB — POCT GLYCOSYLATED HEMOGLOBIN (HGB A1C): Hemoglobin A1C: 5.3 % (ref 4.0–5.6)

## 2021-04-03 NOTE — Progress Notes (Signed)
04/03/2021, 9:37 AM  Endocrinology follow-up note     Subjective:    Patient ID: [name], male    DOB: [birth year].  [name] is being seen in follow-up after he was seen in consultation for management of currently uncontrolled symptomatic diabetes requested by  Redmond School, MD.   Past Medical History:  Diagnosis Date   Asthma    as child   Diabetes mellitus without complication ([place])    DLBCL (diffuse large B cell lymphoma) ([place]) 02/28/2009   Edema    Heart failure, diastolic, chronic ([place])    patient denies   Hyperlipidemia, mixed    Hypertension    IDA (iron deficiency anemia)    Large cell lymphoma ([place]) 01/2005   autologous stem cell transplant 12/2005   Obesity, morbid (more than 100 lbs over ideal weight or BMI > 40) ([place])    Venous insufficiency 04/29/2011    Past Surgical History:  Procedure Laterality Date   BACK SURGERY  2006   T6 vertebrae removed/titanium placed   CATARACT EXTRACTION W/PHACO Right 03/31/2019   Procedure: CATARACT EXTRACTION PHACO AND INTRAOCULAR LENS PLACEMENT ([place]);  Surgeon: Baruch Goldmann, MD;  Location: [facility];  Service: Ophthalmology;  Laterality: Right;  CDE: 3.21   CATARACT EXTRACTION W/PHACO Left 04/14/2019   Procedure: CATARACT EXTRACTION PHACO AND INTRAOCULAR LENS PLACEMENT ([facility]);  Surgeon: Baruch Goldmann, MD;  Location: [facility];  Service: Ophthalmology;  Laterality: Left;  left - pt knows to arrive at 7:45, CDE: 3.55   COLONOSCOPY  11/24/2004   Polyps in the left colon ablated/removed as described above.  Two submucosal lesions consistent with lipomas as described above not  manipulated./ Normal rectum   COLONOSCOPY  06/20/2012   MULTIPLE RECTAL AND COLONIC POLYPS   COLONOSCOPY N/A 03/08/2015   RMR: Capacious, redundant colon. Multiple colonic and rectosigmoid polyps removed. ablated as described above. colonic lipoma abnormal appearing terminal ileum likely a  variant of normal). Howeverwith history  biopsies obtained.    ESOPHAGOGASTRODUODENOSCOPY N/A 03/08/2015   RMR: Hiatal hernia Polypoid gastric mucosa with multiple gastric polyps. largest polyp removed via snare polypectomgy hemostasis clip placed at base. Status post gastric biopsy. Status post video capsule placement.    GIVENS CAPSULE STUDY N/A 03/08/2015   Procedure: GIVENS CAPSULE STUDY;  Surgeon: Daneil Dolin, MD;  Location: [facility];  Service: Endoscopy;  Laterality: N/A;   LIMBAL STEM CELL TRANSPLANT     LUNG BIOPSY  6/06   MULTIPLE TOOTH EXTRACTIONS  04/2005   port a cath placement     PORT-A-CATH REMOVAL  09/08/2012   Procedure: REMOVAL PORT-A-CATH;  Surgeon: Melrose Nakayama, MD;  Location: [facility] OR;  Service: Thoracic;  Laterality: N/A;    Social History   Socioeconomic History   Marital status: Married    Spouse name: Not on file   Number of children: 2   Years of education: Not on file   Highest education level: Not on file  Occupational History   Occupation: disability due to back    Employer: UNEMPLOYED  Tobacco Use   Smoking status: Former    Packs/day: 0.50    Years: 15.00    Pack years: 7.50  Types: Cigarettes    Quit date: 11/11/2002    Years since quitting: 18.4   Smokeless tobacco: Never  Vaping Use   Vaping Use: Never used  Substance and Sexual Activity   Alcohol use: No    Alcohol/week: 0.0 standard drinks   Drug use: No   Sexual activity: Yes    Birth control/protection: None  Other Topics Concern   Not on file  Social History Narrative   Married   No regular exercise   Social Determinants of Health   Financial Resource Strain: Low Risk    Difficulty of Paying Living Expenses: Not hard at all  Food Insecurity: No Food Insecurity   Worried About Charity fundraiser in the Last Year: Never true   Ran Out of Food in the Last Year: Never true  Transportation Needs: No Transportation Needs   Lack of Transportation (Medical): No   Lack  of Transportation (Non-Medical): No  Physical Activity: Inactive   Days of Exercise per Week: 0 days   Minutes of Exercise per Session: 0 min  Stress: No Stress Concern Present   Feeling of Stress : Not at all  Social Connections: Moderately Integrated   Frequency of Communication with Friends and Family: More than three times a week   Frequency of Social Gatherings with Friends and Family: Twice a week   Attends Religious Services: More than 4 times per year   Active Member of [employer] or Organizations: No   Attends Archivist Meetings: Never   Marital Status: Married    Family History  Problem Relation Age of Onset   Cancer Mother        lung   Hypertension Mother    Hyperlipidemia Mother    Cancer Father        prostate   Hypertension Father    Colon cancer Neg Hx    Diabetes Neg Hx     Outpatient Encounter Medications as of 04/03/2021  Medication Sig   apixaban (ELIQUIS) 5 MG TABS tablet Take 1 tablet (5 mg total) by mouth 2 (two) times daily.   Blood Glucose Monitoring Suppl (ONE TOUCH ULTRA 2) w/Device KIT USE TO TEST BLOOD SUGAR ONCE D UTD   clotrimazole-betamethasone (LOTRISONE) cream Apply 1 application topically 2 (two) times daily as needed (leg infection).    ergocalciferol (VITAMIN D2) 1.25 MG (50000 UT) capsule Take 1 capsule (50,000 Units total) by mouth once a week.   folic acid (FOLVITE) 1 MG tablet TAKE 1 TABLET(1 MG) BY MOUTH DAILY   furosemide (LASIX) 20 MG tablet Take 20 mg by mouth daily as needed for fluid or edema.    insulin degludec (TRESIBA FLEXTOUCH) 100 UNIT/ML FlexTouch Pen Inject 35 Units into the skin at bedtime.   Insulin Pen Needle (B-D ULTRAFINE III SHORT PEN) 31G X 8 MM MISC 1 each by Does not apply route daily. Use once a day as directed   Lancets (ONETOUCH DELICA PLUS CVUDTH43O) MISC Use to check blood glucose twice daily, before breakfast and before bed   metFORMIN (GLUCOPHAGE) 500 MG tablet TAKE 1 TABLET(500 MG) BY MOUTH TWICE DAILY  WITH A MEAL   metoprolol tartrate (LOPRESSOR) 25 MG tablet Take 1 tablet (25 mg total) by mouth 2 (two) times daily.   ONETOUCH ULTRA test strip TEST TWICE DAILY   rosuvastatin (CRESTOR) 40 MG tablet Take 40 mg by mouth daily.   vitamin B-12 (CYANOCOBALAMIN) 1000 MCG tablet Take 2,000 mcg by mouth daily.   No facility-administered encounter  medications on file as of 04/03/2021.    ALLERGIES: Allergies  Allergen Reactions   Penicillins Rash    Has patient had a PCN reaction causing immediate rash, facial/tongue/throat swelling, SOB or lightheadedness with hypotension: Unknown Has patient had a PCN reaction causing severe rash involving mucus membranes or skin necrosis: Unknown Has patient had a PCN reaction that required hospitalization: Unknown Has patient had a PCN reaction occurring within the last 10 years: No If all of the above answers are "NO", then may proceed with Cephalosporin use.     VACCINATION STATUS: Immunization History  Administered Date(s) Administered   Fluad Quad(high Dose 65+) 08/04/2019, 06/28/2020   Influenza Split 06/08/2012   Influenza,inj,Quad PF,6+ Mos 07/14/2013, 06/20/2015   Influenza-Unspecified 07/15/2011, 06/02/2012   Moderna Sars-Covid-2 Vaccination 11/03/2019, 12/05/2019    Diabetes He presents for his follow-up diabetic visit. He has type 2 diabetes mellitus. Onset time: He was diagnosed in March 2021 after at least 2 years of exposure to steroids related to his B-cell lymphoma which required autologous stem cell transplant bone marrow. His disease course has been stable. There are no hypoglycemic associated symptoms. Pertinent negatives for hypoglycemia include no confusion, headaches, pallor or seizures. Pertinent negatives for diabetes include no blurred vision, no chest pain, no fatigue, no polydipsia, no polyphagia, no polyuria and no weakness. There are no hypoglycemic complications. Symptoms are stable. Diabetic complications include nephropathy  and PVD. Risk factors for coronary artery disease include diabetes mellitus, dyslipidemia, family history, male sex, obesity, tobacco exposure and sedentary lifestyle. Current diabetic treatment includes insulin injections and oral agent (monotherapy). He is compliant with treatment most of the time. His weight is fluctuating minimally. He is following a generally unhealthy diet. When asked about meal planning, he reported none. He has not had a previous visit with a dietitian. He rarely participates in exercise. His home blood glucose trend is fluctuating minimally. His breakfast blood glucose range is generally 90-110 mg/dl. His bedtime blood glucose range is generally 110-130 mg/dl. (He presents today with his meter and logs showing at goal fasting and postprandial glycemic profile.  His POCT A1c today is 5.3%, unchanged from previous visit.  He denies any episodes of hypoglycemia.) An ACE inhibitor/angiotensin II receptor blocker is not being taken. He sees a podiatrist.Eye exam is current.  Hyperlipidemia This is a chronic problem. The current episode started more than 1 year ago. The problem is controlled. Recent lipid tests were reviewed and are normal. Exacerbating diseases include chronic renal disease, diabetes and obesity. Factors aggravating his hyperlipidemia include beta blockers and fatty foods. Pertinent negatives include no chest pain, myalgias or shortness of breath. Current antihyperlipidemic treatment includes statins. The current treatment provides moderate improvement of lipids. Compliance problems include adherence to exercise and adherence to diet.  Risk factors for coronary artery disease include dyslipidemia, diabetes mellitus, family history, obesity, male sex, hypertension and a sedentary lifestyle.  Hypertension This is a chronic problem. The current episode started more than 1 year ago. The problem has been gradually improving since onset. The problem is controlled. Pertinent  negatives include no blurred vision, chest pain, headaches, neck pain, palpitations or shortness of breath. There are no associated agents to hypertension. Risk factors for coronary artery disease include dyslipidemia, diabetes mellitus, male gender, obesity, sedentary lifestyle, smoking/tobacco exposure and family history. Past treatments include beta blockers and diuretics. The current treatment provides mild improvement. Compliance problems include diet and exercise.  Hypertensive end-organ damage includes kidney disease and PVD. Identifiable causes of hypertension include  chronic renal disease.   Review of systems  Constitutional: + Minimally fluctuating body weight,  current Body mass index is 35.76 kg/m. , no fatigue, no subjective hyperthermia, no subjective hypothermia Eyes: no blurry vision, no xerophthalmia ENT: no sore throat, no nodules palpated in throat, no dysphagia/odynophagia, no hoarseness Cardiovascular: no chest pain, no shortness of breath, no palpitations, + leg swelling with leaking- wears compression stockings Respiratory: no cough, no shortness of breath Gastrointestinal: no nausea/vomiting/diarrhea Musculoskeletal: no muscle/joint aches, walks with cane Skin: no rashes, no hyperemia, reports toenails in need of trim-seeing podiatrist soon Neurological: no tremors, no numbness, no tingling, no dizziness Psychiatric: no depression, no anxiety  Objective:    BP (!) 151/86   Pulse 69   Ht 5' 11.5" (1.816 m)   Wt 260 lb (117.9 kg)   BMI 35.76 kg/m   Wt Readings from Last 3 Encounters:  04/03/21 260 lb (117.9 kg)  02/13/21 258 lb (117 kg)  12/02/20 263 lb (119.3 kg)    BP Readings from Last 3 Encounters:  04/03/21 (!) 151/86  02/13/21 (!) 142/70  12/02/20 (!) 155/75      Physical Exam- Limited  Constitutional:  Body mass index is 35.76 kg/m. , not in acute distress, normal state of mind Eyes:  EOMI, no exophthalmos Neck: Supple Cardiovascular: RRR, no  murmurs, rubs, or gallops, no edema Respiratory: Adequate breathing efforts, no crackles, rales, rhonchi, or wheezing Musculoskeletal: no gross deformities, strength intact in all four extremities, no gross restriction of joint movements, walks with cane Skin:  no rashes, no hyperemia Neurological: no tremor with outstretched hands    CMP ( most recent) CMP     Component Value Date/Time   NA 145 (H) 03/31/2021 0850   K 4.3 03/31/2021 0850   CL 103 03/31/2021 0850   CO2 23 03/31/2021 0850   GLUCOSE 96 03/31/2021 0850   GLUCOSE 110 (H) 06/28/2020 0943   BUN 17 03/31/2021 0850   CREATININE 1.44 (H) 03/31/2021 0850   CALCIUM 9.4 03/31/2021 0850   PROT 5.8 (L) 03/31/2021 0850   ALBUMIN 4.2 03/31/2021 0850   AST 23 03/31/2021 0850   ALT 26 03/31/2021 0850   ALKPHOS 80 03/31/2021 0850   BILITOT 0.4 03/31/2021 0850   GFRNONAA 63 07/02/2020 0909   GFRNONAA >60 06/28/2020 0943   GFRAA 73 07/02/2020 0909    Diabetic Labs (most recent): Lab Results  Component Value Date   HGBA1C 5.3 04/03/2021   HGBA1C 5.3 12/02/2020   HGBA1C 4.8 07/02/2020    Lab Results  Component Value Date   TSH 2.760 07/02/2020   TSH 1.122 09/08/2017   FREET4 1.06 07/02/2020      Assessment & Plan:   1) Uncontrolled type 2 diabetes mellitus with hyperglycemia (HCC)  - Mason has currently uncontrolled symptomatic type 2 DM since  70 years of age.  He presents today with his meter and logs showing at goal fasting and postprandial glycemic profile.  His POCT A1c today is 5.3%, unchanged from previous visit.  He denies any episodes of hypoglycemia.  - Recent labs reviewed.  - I had a long discussion with him about the progressive nature of diabetes and the pathology behind its complications. -his diabetes is complicated by obesity/sedentary life, peripheral arterial disease with venous stasis and he remains at a high risk for more acute and chronic complications which include CAD, CVA, CKD,  retinopathy, and neuropathy. These are all discussed in detail with him.  - Nutritional counseling repeated  at each appointment due to patients tendency to fall back in to old habits.  - The patient admits there is a room for improvement in their diet and drink choices. -  Suggestion is made for the patient to avoid simple carbohydrates from their diet including Cakes, Sweet Desserts / Pastries, Ice Cream, Soda (diet and regular), Sweet Tea, Candies, Chips, Cookies, Sweet Pastries, Store Bought Juices, Alcohol in Excess of 1-2 drinks a day, Artificial Sweeteners, Coffee Creamer, and "Sugar-free" Products. This will help patient to have stable blood glucose profile and potentially avoid unintended weight gain.   - I encouraged the patient to switch to unprocessed or minimally processed complex starch and increased protein intake (animal or plant source), fruits, and vegetables.   - Patient is advised to stick to a routine mealtimes to eat 3 meals a day and avoid unnecessary snacks (to snack only to correct hypoglycemia).  - I have approached him with the following individualized plan to manage  his diabetes and patient agrees:   -Given his presentation with controlled glycemic profile, he will not need bolus insulin at this time.    -He is tolerating his current dose of Tresiba 35 units SQ nightly well and is advised to continue.  He is also advised to continue Metformin 500 mg po twice daily with meals.  -He is encouraged to continue monitoring blood glucose levels twice daily, before breakfast and before bed and report blood glucose levels less than 70 or greater than 200 for 3 tests in a row.  - he is not a candidate for TZDs, SGLT2 inhibitors due to compromised peripheral circulation.    - he will be considered for incretin therapy as appropriate next visit.  - Specific targets for  A1c;  LDL, HDL,  and Triglycerides were discussed with the patient.  2) Blood Pressure /Hypertension:  His  blood pressure is controlled to target.  He is advised to continue Lasix 20 mg po daily as needed for fluid, and Metoprolol 25 mg po twice daily.  He is advised to adopt a low sodium diet.  3) Lipids/Hyperlipidemia:  His most recent lipid panel from 07/02/20 shows controlled LDL of 78.  He is advised to continue Crestor 40 mg po daily at bedtime.  Side effects and precautions discussed with him.  Will recheck lipid panel prior to next visit.  4)  Weight/Diet:  His Body mass index is 35.76 kg/m.  -   clearly complicating his diabetes care.   he is  a candidate for weight loss. I discussed with him the fact that loss of 5 - 10% of his  current body weight will have the most impact on his diabetes management.  Exercise, and detailed carbohydrates information provided  -  detailed on discharge instructions.  5) Chronic Care/Health Maintenance: -he is on Statin medications and is encouraged to initiate and continue to follow up with Ophthalmology, Dentist,  Podiatrist at least yearly or according to recommendations, and advised to stay away from smoking. I have recommended yearly flu vaccine and pneumonia vaccine at least every 5 years; moderate intensity exercise for up to 150 minutes weekly; and  sleep for at least 7 hours a day.  - he is  advised to maintain close follow up with Redmond School, MD for primary care needs, as well as his other providers for optimal and coordinated care.      I spent 35 minutes in the care of the patient today including review of labs from CMP, Lipids,  Thyroid Function, Hematology (current and previous including abstractions from other facilities); face-to-face time discussing  his blood glucose readings/logs, discussing hypoglycemia and hyperglycemia episodes and symptoms, medications doses, his options of short and long term treatment based on the latest standards of care / guidelines;  discussion about incorporating lifestyle medicine;  and documenting the  encounter.    Please refer to Patient Instructions for Blood Glucose Monitoring and Insulin/Medications Dosing Guide"  in media tab for additional information. Please  also refer to " Patient Self Inventory" in the Media  tab for reviewed elements of pertinent patient history.  [name] participated in the discussions, expressed understanding, and voiced agreement with the above plans.  All questions were answered to his satisfaction. he is encouraged to contact clinic should he have any questions or concerns prior to his return visit.   Follow up plan: - Return in about 4 months (around 08/03/2021) for Diabetes F/U- A1c and UM in office, Previsit labs, Bring meter and logs.    [name], [facility] [address] [place], [place] [address] Phone: [phone] Fax: [phone]  04/03/2021, 9:37 AM

## 2021-04-08 IMAGING — CT CT ABD-PELV W/ CM
3 of 5 series · 14 of 36 positions shown, 17 images · IV contrast (Omnipaque or Isovue)
Comparison: CT chest angiogram, 05/18/2017, PET-CT, 08/31/2005

CLINICAL DATA: Shortness of breath, chest pain, history of lymphoma
in remission

EXAM:
CT CHEST, ABDOMEN, AND PELVIS WITH CONTRAST
TECHNIQUE: Multidetector CT imaging of the chest, abdomen and pelvis was
performed following the standard protocol during bolus
administration of intravenous contrast.
CONTRAST:  100mL OMNIPAQUE IOHEXOL 300 MG/ML SOLN, additional oral
enteric contrast

[Series 3: cap with · axial · 0.80mm/px · z∈[+889,+1384]mm · 9 of 125 slices shown, 12 images]
[im 13/125  mediastinal]
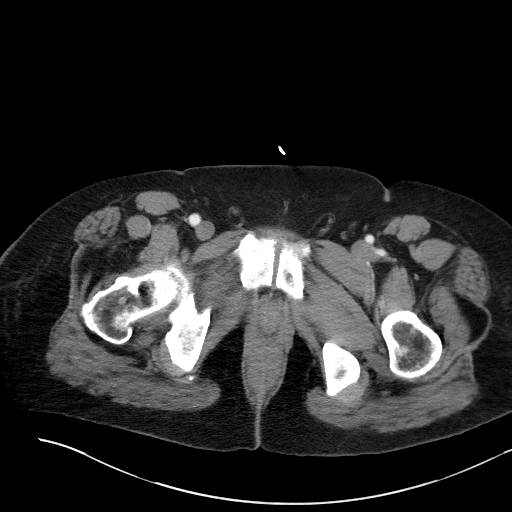
[im 13/125  lung]
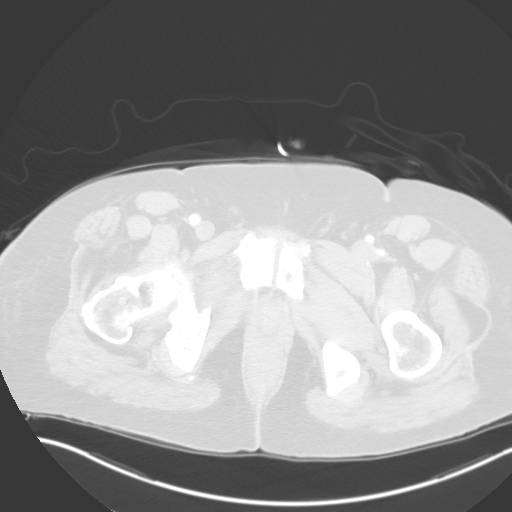
[im 25/125  lung]
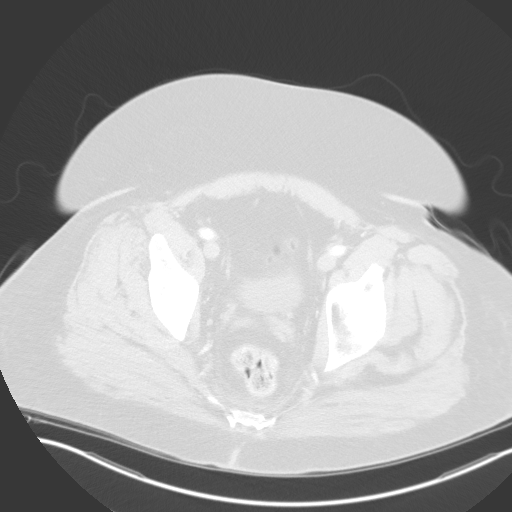
[im 38/125  lung]
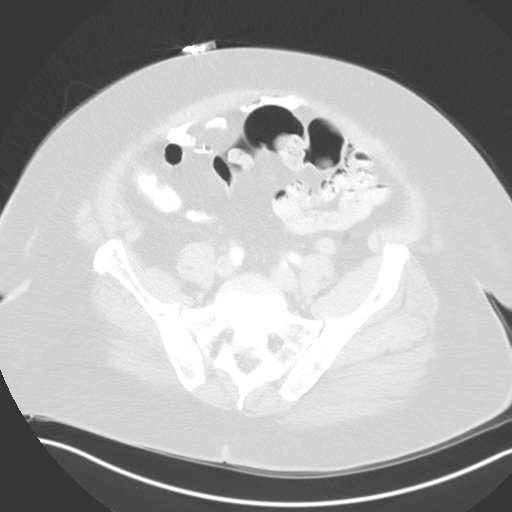
[im 50/125  lung]
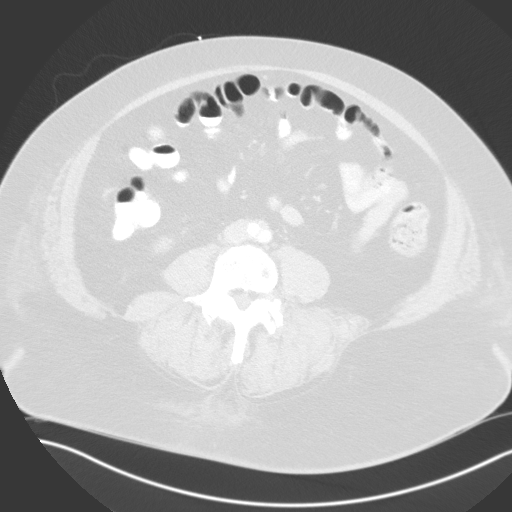
[im 63/125  mediastinal]
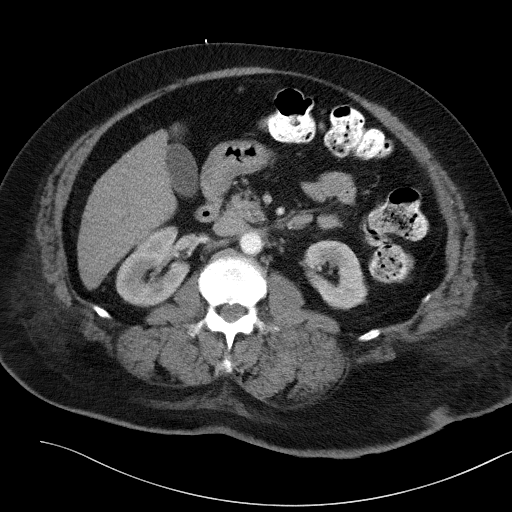
[im 63/125  lung]
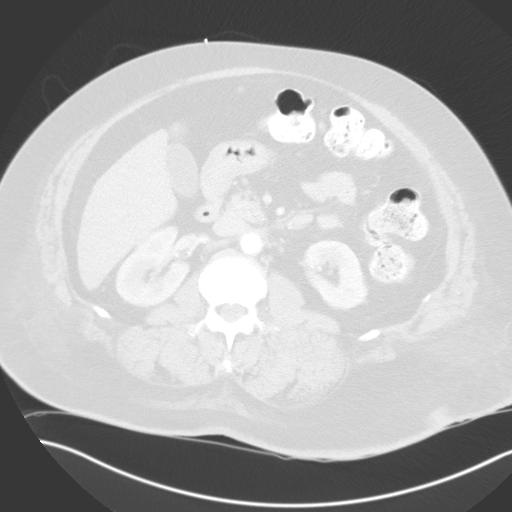
[im 75/125  lung]
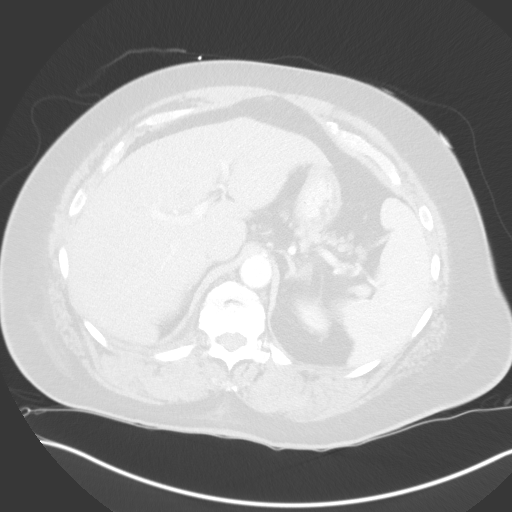
[im 87/125  lung]
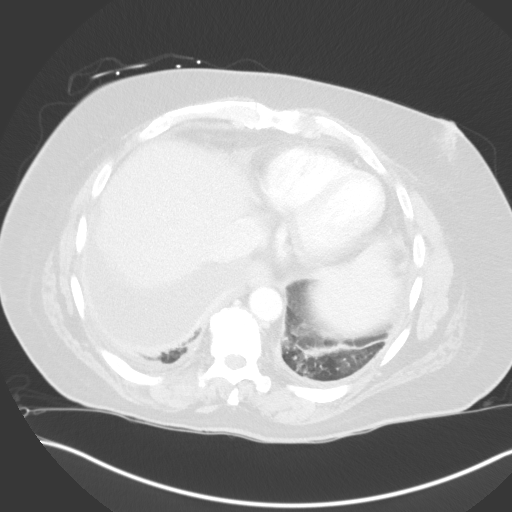
[im 100/125  lung]
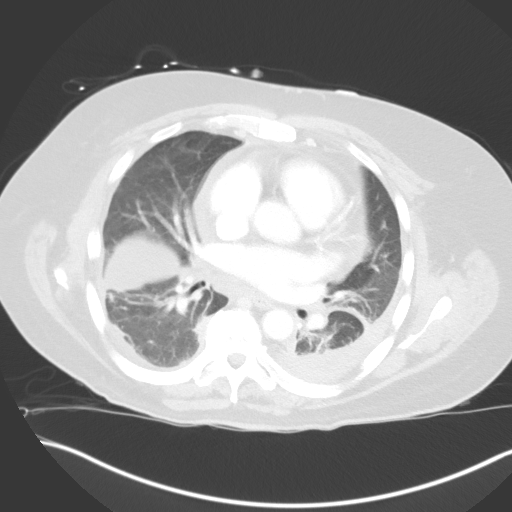
[im 112/125  mediastinal]
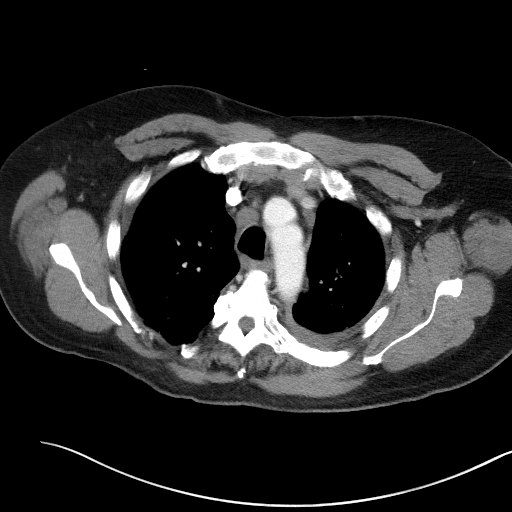
[im 112/125  lung]
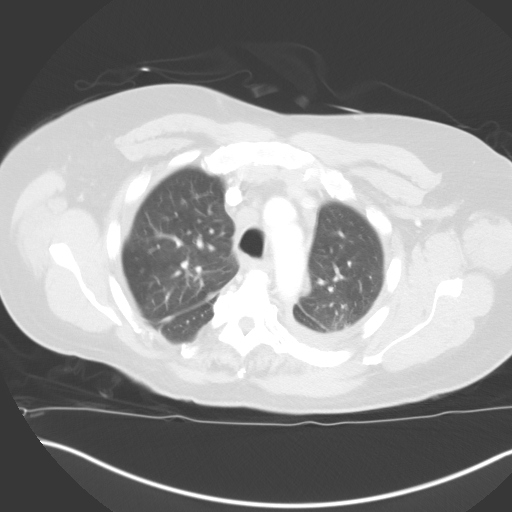

[Series 5: lung · axial · 0.72mm/px · z∈[+1167,+1215]mm · 2 of 153 slices shown]
[im 12/153  lung]
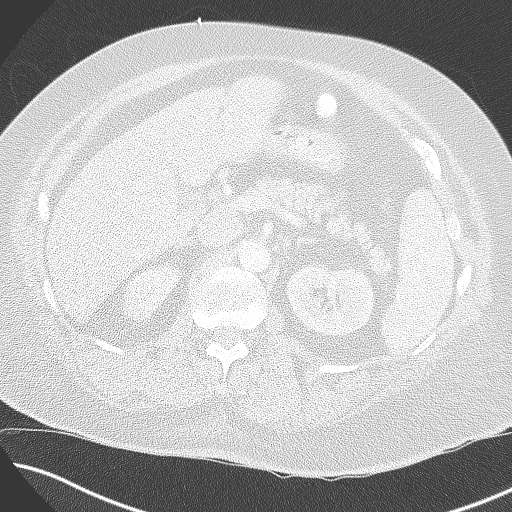
[im 36/153  lung]
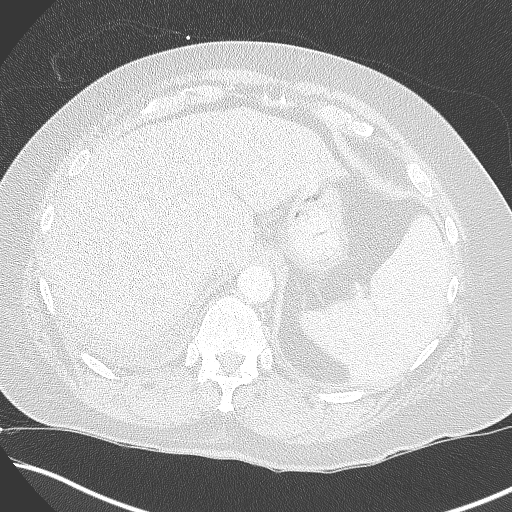

[Series 6: coronals · coronal · 1.02mm/px · 3 of 193 slices shown]
[im 39/193  lung]
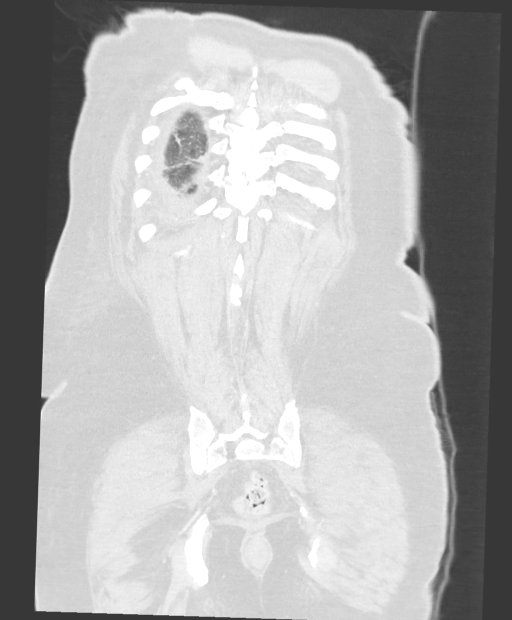
[im 77/193  lung]
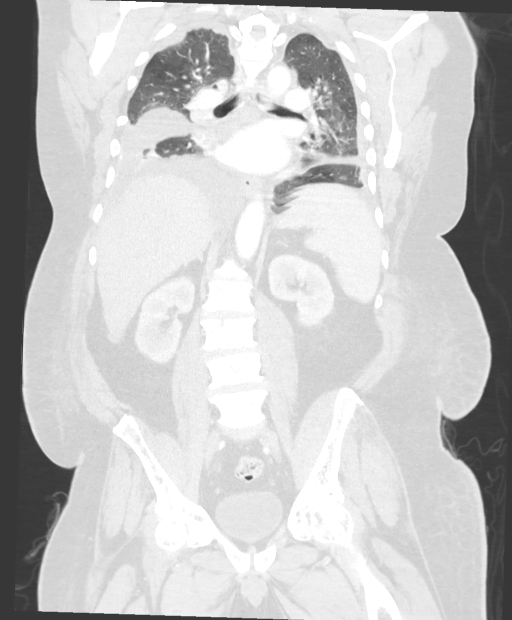
[im 116/193  lung]
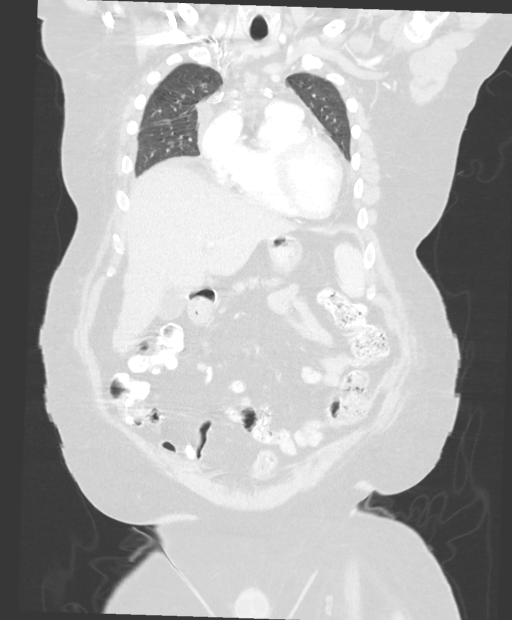

[14 of 36 positions shown; findings below may reference images not displayed]

FINDINGS: CT CHEST FINDINGS

Cardiovascular: No significant vascular findings. Cardiomegaly.
Scattered coronary artery calcifications. No pericardial effusion.

Mediastinum/Nodes: There are enlarged anterior mediastinal,
pretracheal, and AP window lymph nodes, some of which are calcified
in the anterior mediastinum. A pretracheal lymph node appears
enlarged compared to prior examination, measuring 1.9 x 1.5 cm
(series 3, image 14). Other lymph nodes are generally similar.
Thyroid gland, trachea, and esophagus demonstrate no significant
findings.

Lungs/Pleura: Moderate right, small left pleural effusions, which
appear loculated. There is associated partial atelectasis and/or
scarring of the dependent bilateral lungs.

Musculoskeletal: Corpectomy of T6.

CT ABDOMEN PELVIS FINDINGS

Hepatobiliary: No solid liver abnormality is seen. No gallstones,
gallbladder wall thickening, or biliary dilatation.

Pancreas: Unremarkable. No pancreatic ductal dilatation or
surrounding inflammatory changes.

Spleen: Normal in size without significant abnormality.

Adrenals/Urinary Tract: Adrenal glands are unremarkable. Kidneys are
normal, without renal calculi, solid lesion, or hydronephrosis.
Bladder is unremarkable.

Stomach/Bowel: Stomach is within normal limits. Appendix appears
normal. No evidence of bowel wall thickening, distention, or
inflammatory changes.

Vascular/Lymphatic: Aortic atherosclerosis. There is periaortic soft
tissue thickening, which is new compared to prior examination dated
08/31/2005 and generally most consistent with post treatment
appearance of lymphadenopathy (series 3, image 71). There are no
discretely enlarged lymph nodes in the abdomen or pelvis.

Reproductive: No mass or other abnormality.

Other: No abdominal wall hernia or abnormality. No abdominopelvic
ascites.

Musculoskeletal: No acute or significant osseous findings.
IMPRESSION: 1. Moderate right, small left pleural effusions, which appear
loculated. Associated partial atelectasis and/or scarring of the
dependent bilateral lungs.
2. There are enlarged anterior mediastinal, pretracheal, and AP
window lymph nodes, some of which are calcified in the anterior
mediastinum. A pretracheal lymph node appears enlarged compared to
prior examination of the chest dated 05/18/2017, measuring 1.9 x
cm (series 3, image 14). Other lymph nodes are generally similar. No
definite evidence of recurrent lymphoma. PET-CT may be helpful to
assess for metabolic activity and recurrent malignancy if generally
suspected based upon clinical presentation.
3. Retroperitoneal soft tissue thickening, which is new compared to
most recent imaging of the abdomen and pelvis dated 08/31/2005 and
generally most consistent with post treatment appearance of
lymphadenopathy.
4. Coronary artery disease. Aortic Atherosclerosis ([id number]).

## 2021-05-02 ENCOUNTER — Encounter: Payer: Self-pay | Admitting: Cardiology

## 2021-05-02 ENCOUNTER — Other Ambulatory Visit (HOSPITAL_COMMUNITY)
Admission: RE | Admit: 2021-05-02 | Discharge: 2021-05-02 | Disposition: A | Discharge status: Home / Self Care | Payer: Medicare Other | Source: Ambulatory Visit | Attending: Cardiology | Admitting: Cardiology

## 2021-05-02 ENCOUNTER — Other Ambulatory Visit: Payer: Self-pay

## 2021-05-02 ENCOUNTER — Ambulatory Visit: Payer: Medicare Other | Admitting: Cardiology

## 2021-05-02 VITALS — BP 140/70 | HR 71 | Ht 71.0 in | Wt 257.2 lb

## 2021-05-02 DIAGNOSIS — I5032 Chronic diastolic (congestive) heart failure: Secondary | ICD-10-CM | POA: Diagnosis not present

## 2021-05-02 DIAGNOSIS — I4892 Unspecified atrial flutter: Secondary | ICD-10-CM

## 2021-05-02 DIAGNOSIS — I10 Essential (primary) hypertension: Secondary | ICD-10-CM | POA: Diagnosis not present

## 2021-05-02 LAB — BASIC METABOLIC PANEL
Anion gap: 7 (ref 5–15)
BUN: 20 mg/dL (ref 8–23)
CO2: 25 mmol/L (ref 22–32)
Calcium: 8.7 mg/dL — ABNORMAL LOW (ref 8.9–10.3)
Chloride: 105 mmol/L (ref 98–111)
Creatinine, Ser: 1.4 mg/dL — ABNORMAL HIGH (ref 0.61–1.24)
GFR, Estimated: 54 mL/min — ABNORMAL LOW (ref >60)
Glucose, Bld: 78 mg/dL (ref 70–99)
Potassium: 3.7 mmol/L (ref 3.5–5.1)
Sodium: 137 mmol/L (ref 135–145)

## 2021-05-02 LAB — MAGNESIUM: Magnesium: 2 mg/dL (ref 1.7–2.4)

## 2021-05-02 NOTE — Progress Notes (Signed)
Clinical Summary Mr. Tiegs is a 70 y.o.male seen today for follow up of the following medical problems.    1. Aflutter   - no recent palpitaitons - compliant with meds - no bleeding on eliquis.    2. Chest discomfort - occurs once a month - dull pain, left sided. 1/10 in severity. Usually comes on at rest - lasts a few minutes. Better with deep breathing.  - 2016 nuclear stress test no ischemia  - no recent symptoms   3. Chronic diastolic HF Jan 3007 echo LVEF 62-26%, indet diastolic function - swelling at times, resolves with prn lasix.   - chronic LE edema more so related to venous relfux, working to wear compression stockings.  - has lasix as needed, takes daily. Last Cr had trended up to 1.4   4. LE edema - 2021 had signs of venous reflux by [place] - has compression stockings.  - followed by vacular   5. Diffuse large B cell lymphoma - prior bone marrow transplant - history of hemolytic anemia   6. HTN - compliant with meds      SH: takes care of grandson during the day, 35 yo.    Past Medical History:  Diagnosis Date   Asthma    as child   Diabetes mellitus without complication ([place])    DLBCL (diffuse large B cell lymphoma) ([place]) 02/28/2009   Edema    Heart failure, diastolic, chronic ([place])    patient denies   Hyperlipidemia, mixed    Hypertension    IDA (iron deficiency anemia)    Large cell lymphoma ([place]) 01/2005   autologous stem cell transplant 12/2005   Obesity, morbid (more than 100 lbs over ideal weight or BMI > 40) ([facility])    Venous insufficiency 04/29/2011     Allergies  Allergen Reactions   Penicillins Rash    Has patient had a PCN reaction causing immediate rash, facial/tongue/throat swelling, SOB or lightheadedness with hypotension: Unknown Has patient had a PCN reaction causing severe rash involving mucus membranes or skin necrosis: Unknown Has patient had a PCN reaction that required hospitalization: Unknown Has patient had a PCN  reaction occurring within the last 10 years: No If all of the above answers are "NO", then may proceed with Cephalosporin use.      Current Outpatient Medications  Medication Sig Dispense Refill   apixaban (ELIQUIS) 5 MG TABS tablet Take 1 tablet (5 mg total) by mouth 2 (two) times daily. 60 tablet 11   Blood Glucose Monitoring Suppl (ONE TOUCH ULTRA 2) w/Device KIT USE TO TEST BLOOD SUGAR ONCE D UTD     clotrimazole-betamethasone (LOTRISONE) cream Apply 1 application topically 2 (two) times daily as needed (leg infection).      ergocalciferol (VITAMIN D2) 1.25 MG (50000 UT) capsule Take 1 capsule (50,000 Units total) by mouth once a week. 16 capsule 3   folic acid (FOLVITE) 1 MG tablet TAKE 1 TABLET(1 MG) BY MOUTH DAILY 30 tablet 4   furosemide (LASIX) 20 MG tablet Take 20 mg by mouth daily as needed for fluid or edema.   0   insulin degludec (TRESIBA FLEXTOUCH) 100 UNIT/ML FlexTouch Pen Inject 35 Units into the skin at bedtime. 30 mL 3   Insulin Pen Needle (B-D ULTRAFINE III SHORT PEN) 31G X 8 MM MISC 1 each by Does not apply route daily. Use once a day as directed 100 each 3   Lancets (ONETOUCH DELICA PLUS [id number]) MISC Use to check  blood glucose twice daily, before breakfast and before bed 300 each 3   metFORMIN (GLUCOPHAGE) 500 MG tablet TAKE 1 TABLET(500 MG) BY MOUTH TWICE DAILY WITH A MEAL 180 tablet 0   metoprolol tartrate (LOPRESSOR) 25 MG tablet Take 1 tablet (25 mg total) by mouth 2 (two) times daily. 180 tablet 3   ONETOUCH ULTRA test strip TEST TWICE DAILY 100 strip 3   rosuvastatin (CRESTOR) 40 MG tablet Take 40 mg by mouth daily.     vitamin B-12 (CYANOCOBALAMIN) 1000 MCG tablet Take 2,000 mcg by mouth daily.     No current facility-administered medications for this visit.     Past Surgical History:  Procedure Laterality Date   BACK SURGERY  2006   T6 vertebrae removed/titanium placed   CATARACT EXTRACTION W/PHACO Right 03/31/2019   Procedure: CATARACT EXTRACTION PHACO  AND INTRAOCULAR LENS PLACEMENT ([place]);  Surgeon: Baruch Goldmann, MD;  Location: [facility];  Service: Ophthalmology;  Laterality: Right;  CDE: 3.21   CATARACT EXTRACTION W/PHACO Left 04/14/2019   Procedure: CATARACT EXTRACTION PHACO AND INTRAOCULAR LENS PLACEMENT ([facility]);  Surgeon: Baruch Goldmann, MD;  Location: [facility];  Service: Ophthalmology;  Laterality: Left;  left - pt knows to arrive at 7:45, CDE: 3.55   COLONOSCOPY  11/24/2004   Polyps in the left colon ablated/removed as described above.  Two submucosal lesions consistent with lipomas as described above not  manipulated./ Normal rectum   COLONOSCOPY  06/20/2012   MULTIPLE RECTAL AND COLONIC POLYPS   COLONOSCOPY N/A 03/08/2015   RMR: Capacious, redundant colon. Multiple colonic and rectosigmoid polyps removed. ablated as described above. colonic lipoma abnormal appearing terminal ileum likely a variant of normal). Howeverwith history  biopsies obtained.    ESOPHAGOGASTRODUODENOSCOPY N/A 03/08/2015   RMR: Hiatal hernia Polypoid gastric mucosa with multiple gastric polyps. largest polyp removed via snare polypectomgy hemostasis clip placed at base. Status post gastric biopsy. Status post video capsule placement.    GIVENS CAPSULE STUDY N/A 03/08/2015   Procedure: GIVENS CAPSULE STUDY;  Surgeon: Daneil Dolin, MD;  Location: [facility];  Service: Endoscopy;  Laterality: N/A;   LIMBAL STEM CELL TRANSPLANT     LUNG BIOPSY  6/06   MULTIPLE TOOTH EXTRACTIONS  04/2005   port a cath placement     PORT-A-CATH REMOVAL  09/08/2012   Procedure: REMOVAL PORT-A-CATH;  Surgeon: Melrose Nakayama, MD;  Location: [facility];  Service: Thoracic;  Laterality: N/A;     Allergies  Allergen Reactions   Penicillins Rash    Has patient had a PCN reaction causing immediate rash, facial/tongue/throat swelling, SOB or lightheadedness with hypotension: Unknown Has patient had a PCN reaction causing severe rash involving mucus membranes or skin necrosis: Unknown Has  patient had a PCN reaction that required hospitalization: Unknown Has patient had a PCN reaction occurring within the last 10 years: No If all of the above answers are "NO", then may proceed with Cephalosporin use.       Family History  Problem Relation Age of Onset   Cancer Mother        lung   Hypertension Mother    Hyperlipidemia Mother    Cancer Father        prostate   Hypertension Father    Colon cancer Neg Hx    Diabetes Neg Hx      Social History Mr. Titzer reports that he quit smoking about 18 years ago. He has a 7.50 pack-year smoking history. He has never used smokeless tobacco. Mr.  Dumm reports no history of alcohol use.   Review of Systems CONSTITUTIONAL: No weight loss, fever, chills, weakness or fatigue.  HEENT: Eyes: No visual loss, blurred vision, double vision or yellow sclerae.No hearing loss, sneezing, congestion, runny nose or sore throat.  SKIN: No rash or itching.  CARDIOVASCULAR: per hpi RESPIRATORY: No shortness of breath, cough or sputum.  GASTROINTESTINAL: No anorexia, nausea, vomiting or diarrhea. No abdominal pain or blood.  GENITOURINARY: No burning on urination, no polyuria NEUROLOGICAL: No headache, dizziness, syncope, paralysis, ataxia, numbness or tingling in the extremities. No change in bowel or bladder control.  MUSCULOSKELETAL: No muscle, back pain, joint pain or stiffness.  LYMPHATICS: No enlarged nodes. No history of splenectomy.  PSYCHIATRIC: No history of depression or anxiety.  ENDOCRINOLOGIC: No reports of sweating, cold or heat intolerance. No polyuria or polydipsia.  Marland Kitchen   Physical Examination Today's Vitals   05/02/21 1314  BP: 140/70  Pulse: 71  SpO2: 97%  Weight: 257 lb 3.2 oz (116.7 kg)  Height: 5' 11"  (1.803 m)   Body mass index is 35.87 kg/m.  Gen: resting comfortably, no acute distress HEENT: no scleral icterus, pupils equal round and reactive, no palptable cervical adenopathy,  CV: RRR, no m/r/g no jvd Resp:  Clear to auscultation bilaterally GI: abdomen is soft, non-tender, non-distended, normal bowel sounds, no hepatosplenomegaly MSK: extremities are warm, no edema.  Skin: warm, no rash Neuro:  no focal deficits Psych: appropriate affect   Diagnostic Studies  Nuclear stress test 03/27/15:   There was no ST segment deviation noted during stress. Defect 1: There is a small defect of moderate severity present in the basal inferior location. Due to soft tissue attenuation artifact. This is a low risk study. The left ventricular ejection fraction is normal (55-65%).      Echo 09/07/17:   - Procedure narrative: Transthoracic echocardiography. Image    quality was adequate. Intravenous contrast (Definity) was    administered.  - Left ventricle: The cavity size was normal. Wall thickness was    increased in a pattern of mild LVH. Systolic function was normal.    The estimated ejection fraction was in the range of 60% to 65%.    Wall motion was normal; there were no regional wall motion    abnormalities. The study is not technically sufficient to allow    evaluation of LV diastolic function due to atrial flutter.  - Left atrium: The atrium was mildly dilated.  - Systemic veins: IVC is dilated with normal respiratory variation.    Estimated CVP 8 mmHg.         Assessment and Plan  1. Aflutter - no recent symptoms, continue current meds    2. Chronic diastolic HF - a lot of his chronic LE edema is more related to venous reflux - appears euvolemic today. Last Cr was uptrending, repeat labs  3. HTN - manual recheck 130/65 which is at goal, continue current meds     Arnoldo Lenis, M.D.

## 2021-05-02 NOTE — Patient Instructions (Signed)
Medication Instructions:  Your physician recommends that you continue on your current medications as directed. Please refer to the Current Medication list given to you today.  *If you need a refill on your cardiac medications before your next appointment, please call your pharmacy*   Lab Work: Your physician recommends that you return for lab work in: Today   If you have labs (blood work) drawn today and your tests are completely normal, you will receive your results only by: MyChart Message (if you have MyChart) OR A paper copy in the mail If you have any lab test that is abnormal or we need to change your treatment, we will call you to review the results.   Testing/Procedures: NONE    Follow-Up: At [facility], you and your health needs are our priority.  As part of our continuing mission to provide you with exceptional heart care, we have created designated Provider Care Teams.  These Care Teams include your primary Cardiologist (physician) and Advanced Practice Providers (APPs -  Physician Assistants and Nurse Practitioners) who all work together to provide you with the care you need, when you need it.  We recommend signing up for the patient portal called "MyChart".  Sign up information is provided on this After Visit Summary.  MyChart is used to connect with patients for Virtual Visits (Telemedicine).  Patients are able to view lab/test results, encounter notes, upcoming appointments, etc.  Non-urgent messages can be sent to your provider as well.   To learn more about what you can do with MyChart, go to [email or web address].    Your next appointment:   6 month(s)  The format for your next appointment:   In Person  Provider:   Carlyle Dolly, MD   Other Instructions Thank you for choosing Center!

## 2021-05-05 ENCOUNTER — Telehealth: Payer: Self-pay

## 2021-05-05 NOTE — Telephone Encounter (Signed)
-----   Message from Arnoldo Lenis, MD sent at 05/04/2021  9:51 AM EDT ----- Kidney function is mildly decreased but stable from last check. Continue current meds   Zandra Abts MD

## 2021-05-05 NOTE — Telephone Encounter (Signed)
Patient notified and verbalized understanding. 

## 2021-05-14 DIAGNOSIS — U071 COVID-19: Secondary | ICD-10-CM | POA: Diagnosis not present

## 2021-05-23 DIAGNOSIS — E782 Mixed hyperlipidemia: Secondary | ICD-10-CM | POA: Diagnosis not present

## 2021-05-23 DIAGNOSIS — I2510 Atherosclerotic heart disease of native coronary artery without angina pectoris: Secondary | ICD-10-CM | POA: Diagnosis not present

## 2021-05-23 DIAGNOSIS — E1165 Type 2 diabetes mellitus with hyperglycemia: Secondary | ICD-10-CM | POA: Diagnosis not present

## 2021-05-29 ENCOUNTER — Encounter: Payer: Self-pay | Admitting: Vascular Surgery

## 2021-05-29 ENCOUNTER — Other Ambulatory Visit: Payer: Self-pay

## 2021-05-29 ENCOUNTER — Other Ambulatory Visit: Payer: Self-pay | Admitting: Nurse Practitioner

## 2021-05-29 ENCOUNTER — Ambulatory Visit (INDEPENDENT_AMBULATORY_CARE_PROVIDER_SITE_OTHER): Payer: Medicare Other | Admitting: Vascular Surgery

## 2021-05-29 VITALS — BP 133/80 | HR 79 | Temp 97.7°F | Resp 20 | Ht 71.0 in | Wt 254.0 lb

## 2021-05-29 DIAGNOSIS — I83813 Varicose veins of bilateral lower extremities with pain: Secondary | ICD-10-CM

## 2021-05-29 DIAGNOSIS — I872 Venous insufficiency (chronic) (peripheral): Secondary | ICD-10-CM | POA: Diagnosis not present

## 2021-05-29 NOTE — Progress Notes (Signed)
REASON FOR VISIT:   Follow-up of chronic venous insufficiency  MEDICAL ISSUES:   COMBINED CHRONIC VENOUS INSUFFICIENCY AND LYMPHEDEMA: This patient has CEAP C4b venous disease.  I had a long discussion with him about the importance of daily leg elevation and the proper positioning for this.  The challenge may be that he has a difficult time laying flat.  In addition we have reinforced with him again the importance of wearing his compression stockings faithfully.  I have encouraged him to avoid prolonged sitting and standing.  We discussed importance of exercise.  We also discussed the importance of maintaining a healthy weight as abdominal obesity especially increases lower extremity venous pressure.  He has significant deep venous reflux bilaterally.  On the right side he also has significant superficial venous reflux involving the great saphenous vein in the small saphenous vein.  I think he would be a candidate for staged laser ablation of the right small saphenous vein and great saphenous vein so that we can address the superficial venous disease.  Currently he states that he has a lot going on and would like to hold off on this for now.  I will plan on getting him back in 3 months and he will need a follow-up venous duplex scan on the right at that time.  I think the other thing that we should consider would be the BioTAB pneumatic compression device if his symptoms and swelling have not improved significantly when I see him back in 3 months.  I think that he does have lymphedema secondary to chronic venous insufficiency (phlebolymphedema).  He knows to call sooner if anything changes.   HPI:   [name] is a pleasant 70 y.o. male who was last seen in our office by Dr. Ruta Hinds on 02/13/2021.  He had presented with several years of leg swelling.  He had previously been seen in our office by the physicians assistant in October 2021.  At that time he was encouraged to wear thigh-high  compression stockings and elevate his legs.  However, when he had seen Dr. Oneida Alar he admitted he had not really been wearing the thigh-high compression stockings.  In order to help him be more compliant with the compression stockings he was fitted for knee-high compression stockings.  Dr. Oneida Alar also discussed weight loss and leg elevation.  He was set up for 37-monthfollow-up visit.  He has multiple medical issues including type 2 diabetes, hypertension, hypercholesterolemia, chronic diastolic congestive heart failure, and atrial flutter for which she is on Eliquis.   On my history, the patient has had a long history of bilateral lower extremity swelling.  This has been gradually progressive.  He denies having any open ulcers although he has had significant lymphorrhea.  He has been trying to wear his knee-high compression stockings.  He cannot get on the thigh-high stockings.  He denies any previous history of DVT.  He has had no previous venous procedures.  He does try to elevate his legs some.  He does walk.  Past Medical History:  Diagnosis Date   Asthma    as child   [place]    [place]   Edema    Heart [place], diastolic, chronic [place]    patient denies   Hyperlipidemia, mixed    Hypertension    IDA (iron deficiency anemia)    [place] cell [place] 01[place]2005   autologous stem cell transplant  12/2005   Obesity, morbid (more than 100 lbs over ideal weight or BMI > 40) ([facility])    Venous insufficiency 04/29/2011    Family History  Problem Relation Age of Onset   Cancer Mother        lung   Hypertension Mother    Hyperlipidemia Mother    Cancer Father        prostate   Hypertension Father    Colon cancer Neg Hx    Diabetes Neg Hx     SOCIAL HISTORY: Social History   Tobacco Use   Smoking status: Former    Packs/day: 0.50    Years: 15.00    Pack years: 7.50    Types: Cigarettes    Quit date:  11/11/2002    Years since quitting: 18.5   Smokeless tobacco: Never  Substance Use Topics   Alcohol use: No    Alcohol/week: 0.0 standard drinks    Allergies  Allergen Reactions   Penicillins Rash    Has patient had a PCN reaction causing immediate rash, facial/tongue/throat swelling, SOB or lightheadedness with hypotension: Unknown Has patient had a PCN reaction causing severe rash involving mucus membranes or skin necrosis: Unknown Has patient had a PCN reaction that required hospitalization: Unknown Has patient had a PCN reaction occurring within the last 10 years: No If all of the above answers are "NO", then may proceed with Cephalosporin use.     Current Outpatient Medications  Medication Sig Dispense Refill   apixaban (ELIQUIS) 5 MG TABS tablet Take 1 tablet (5 mg total) by mouth 2 (two) times daily. 60 tablet 11   Blood Glucose Monitoring Suppl (ONE TOUCH ULTRA 2) w/Device KIT USE TO TEST BLOOD SUGAR ONCE D UTD     clotrimazole-betamethasone (LOTRISONE) cream Apply 1 application topically 2 (two) times daily as needed (leg infection).      ergocalciferol (VITAMIN D2) 1.25 MG (50000 UT) capsule Take 1 capsule (50,000 Units total) by mouth once a week. 16 capsule 3   folic acid (FOLVITE) 1 MG tablet TAKE 1 TABLET(1 MG) BY MOUTH DAILY 30 tablet 4   furosemide (LASIX) 20 MG tablet Take 20 mg by mouth daily as needed for fluid or edema.   0   insulin degludec (TRESIBA FLEXTOUCH) 100 UNIT/ML FlexTouch Pen Inject 35 Units into the skin at bedtime. 30 mL 3   Insulin Pen Needle (B-D ULTRAFINE III SHORT PEN) 31G X 8 MM MISC 1 each by Does not apply route daily. Use once a day as directed 100 each 3   Lancets (ONETOUCH DELICA PLUS [id number]) MISC Use to check blood glucose twice daily, before breakfast and before bed 300 each 3   metFORMIN (GLUCOPHAGE) 500 MG tablet TAKE 1 TABLET(500 MG) BY MOUTH TWICE DAILY WITH A MEAL 180 tablet 0   ONETOUCH ULTRA test strip TEST TWICE DAILY 100 strip 3    rosuvastatin (CRESTOR) 40 MG tablet Take 40 mg by mouth daily.     vitamin B-12 (CYANOCOBALAMIN) 1000 MCG tablet Take 2,000 mcg by mouth daily.     metoprolol tartrate (LOPRESSOR) 25 MG tablet Take 1 tablet (25 mg total) by mouth 2 (two) times daily. 180 tablet 3   No current facility-administered medications for this visit.    REVIEW OF SYSTEMS:  [X]  denotes positive finding, [ ]  denotes negative finding Cardiac  Comments:  Chest pain or chest pressure:    Shortness of breath upon exertion:    Short of breath when lying flat:  Irregular heart rhythm:        Vascular    Pain in calf, thigh, or hip brought on by ambulation:    Pain in feet at night that wakes you up from your sleep:     Blood clot in your veins:    Leg swelling:  x Bilateral      Pulmonary    Oxygen at home:    Productive cough:     Wheezing:         Neurologic    Sudden weakness in arms or legs:     Sudden numbness in arms or legs:     Sudden onset of difficulty speaking or slurred speech:    Temporary loss of vision in one eye:     Problems with dizziness:         Gastrointestinal    Blood in stool:     Vomited blood:         Genitourinary    Burning when urinating:     Blood in urine:        Psychiatric    Major depression:         Hematologic    Bleeding problems:    Problems with blood clotting too easily:        Skin    Rashes or ulcers: x Stasis dermatitis bilateral      Constitutional    Fever or chills:     PHYSICAL EXAM:   Vitals:   05/29/21 1528  BP: 133/80  Pulse: 79  Resp: 20  Temp: 97.7 F (36.5 C)  SpO2: 97%  Weight: 254 lb (115.2 kg)  Height: 5' 11"  (1.803 m)    GENERAL: The patient is a well-nourished male, in no acute distress. The vital signs are documented above. CARDIAC: There is a regular rate and rhythm.  VASCULAR: I do not detect carotid bruits. He has palpable dorsalis pedis pulses. He has significant hyperpigmentation bilaterally with  lipodermatosclerosis.  He has had some lymphorrhea with 2 small areas that are draining on the right leg. He has significant hyperkaratosis bilat.    I did look at the right great saphenous vein myself with the SonoSite.  There is reflux in the proximal thigh.  In the mid thigh the vein exits the fascia and becomes epifascial.  This epifascial branch could likely be cannulated in the mid thigh if we were to address reflux in the great saphenous vein in the thigh.  I also looked at the small saphenous vein.  There is reflux down to the mid calf.  The vein is moderately dilated. PULMONARY: There is good air exchange bilaterally without wheezing or rales. ABDOMEN: Soft and non-tender with normal pitched bowel sounds.  MUSCULOSKELETAL: There are no major deformities or cyanosis. NEUROLOGIC: No focal weakness or paresthesias are detected. SKIN: There are no ulcers or rashes noted. PSYCHIATRIC: The patient has a normal affect.  DATA:    VENOUS DUPLEX: I have reviewed the venous duplex scan that was done on 05/27/2020.  On the right side there was no evidence of DVT.  There was significant deep venous reflux involving the common femoral vein, femoral vein, and popliteal vein.  There was superficial venous reflux in the superficial system in the great saphenous vein from the saphenofemoral junction to the distal thigh.  Diameters of the vein ranged from 5.2-5.6 mm.  There was also reflux in the small saphenous vein on the right down to the mid calf.  Diameters of the vein  were about 5.3 mm.     On the left side, there was no evidence of DVT.  There was significant deep venous reflux involving the common femoral vein, femoral vein, and popliteal vein.  There was only some superficial venous reflux involving the great saphenous vein in the distal thigh and knee with diameters of 3 to 5 mm.  There was some reflux in the proximal calf and not small saphenous vein with a diameter of 5 mm  here.     Deitra Mayo [facility] [phone]

## 2021-05-30 ENCOUNTER — Other Ambulatory Visit: Payer: Self-pay

## 2021-05-30 DIAGNOSIS — I872 Venous insufficiency (chronic) (peripheral): Secondary | ICD-10-CM

## 2021-06-05 ENCOUNTER — Other Ambulatory Visit (HOSPITAL_COMMUNITY): Payer: Self-pay | Admitting: *Deleted

## 2021-06-05 MED ORDER — VITAMIN B-12 1000 MCG PO TABS: 2000.0000 ug | ORAL_TABLET | Freq: Every day | ORAL | 3 refills | Status: DC

## 2021-06-09 ENCOUNTER — Other Ambulatory Visit (HOSPITAL_COMMUNITY): Payer: Self-pay

## 2021-06-09 DIAGNOSIS — E559 Vitamin D deficiency, unspecified: Secondary | ICD-10-CM

## 2021-06-09 MED ORDER — ERGOCALCIFEROL 1.25 MG (50000 UT) PO CAPS: 50000.0000 [IU] | ORAL_CAPSULE | ORAL | 3 refills | Status: DC

## 2021-07-12 ENCOUNTER — Other Ambulatory Visit (HOSPITAL_COMMUNITY): Payer: Self-pay | Admitting: Hematology

## 2021-07-14 ENCOUNTER — Encounter (HOSPITAL_COMMUNITY): Payer: Self-pay | Admitting: Hematology

## 2021-07-21 ENCOUNTER — Other Ambulatory Visit (HOSPITAL_COMMUNITY): Payer: Self-pay | Admitting: Hematology

## 2021-07-30 DIAGNOSIS — E1165 Type 2 diabetes mellitus with hyperglycemia: Secondary | ICD-10-CM | POA: Diagnosis not present

## 2021-07-31 LAB — COMPREHENSIVE METABOLIC PANEL
ALT: 24 IU/L (ref 0–44)
AST: 23 IU/L (ref 0–40)
Albumin/Globulin Ratio: 1.6 (ref 1.2–2.2)
Albumin: 3.9 g/dL (ref 3.8–4.8)
Alkaline Phosphatase: 71 IU/L (ref 44–121)
BUN/Creatinine Ratio: 11 (ref 10–24)
BUN: 18 mg/dL (ref 8–27)
Bilirubin Total: 0.4 mg/dL (ref 0.0–1.2)
CO2: 25 mmol/L (ref 20–29)
Calcium: 9.1 mg/dL (ref 8.6–10.2)
Chloride: 103 mmol/L (ref 96–106)
Creatinine, Ser: 1.57 mg/dL — ABNORMAL HIGH (ref 0.76–1.27)
Globulin, Total: 2.4 g/dL (ref 1.5–4.5)
Glucose: 74 mg/dL (ref 70–99)
Potassium: 4.4 mmol/L (ref 3.5–5.2)
Sodium: 142 mmol/L (ref 134–144)
Total Protein: 6.3 g/dL (ref 6.0–8.5)
eGFR: 47 mL/min/{1.73_m2} — ABNORMAL LOW (ref >59)

## 2021-07-31 LAB — TSH: TSH: 2.65 u[IU]/mL (ref 0.450–4.500)

## 2021-07-31 LAB — T4, FREE: Free T4: 1.12 ng/dL (ref 0.82–1.77)

## 2021-07-31 LAB — LIPID PANEL
Chol/HDL Ratio: 2.7 ratio (ref 0.0–5.0)
Cholesterol, Total: 133 mg/dL (ref 100–199)
HDL: 49 mg/dL (ref >39)
LDL Chol Calc (NIH): 65 mg/dL (ref 0–99)
Triglycerides: 105 mg/dL (ref 0–149)
VLDL Cholesterol Cal: 19 mg/dL (ref 5–40)

## 2021-08-02 ENCOUNTER — Other Ambulatory Visit: Payer: Self-pay | Admitting: Nurse Practitioner

## 2021-08-04 ENCOUNTER — Ambulatory Visit: Payer: Medicare Other | Admitting: Nurse Practitioner

## 2021-08-04 NOTE — Patient Instructions (Incomplete)

## 2021-08-11 ENCOUNTER — Encounter: Payer: Self-pay | Admitting: Nurse Practitioner

## 2021-08-11 ENCOUNTER — Ambulatory Visit (INDEPENDENT_AMBULATORY_CARE_PROVIDER_SITE_OTHER): Payer: Medicare Other | Admitting: Nurse Practitioner

## 2021-08-11 VITALS — BP 129/79 | HR 55 | Ht 71.0 in | Wt 262.8 lb

## 2021-08-11 DIAGNOSIS — Z794 Long term (current) use of insulin: Secondary | ICD-10-CM

## 2021-08-11 DIAGNOSIS — N1831 Chronic kidney disease, stage 3a: Secondary | ICD-10-CM | POA: Diagnosis not present

## 2021-08-11 DIAGNOSIS — E782 Mixed hyperlipidemia: Secondary | ICD-10-CM

## 2021-08-11 DIAGNOSIS — E1122 Type 2 diabetes mellitus with diabetic chronic kidney disease: Secondary | ICD-10-CM

## 2021-08-11 DIAGNOSIS — I10 Essential (primary) hypertension: Secondary | ICD-10-CM

## 2021-08-11 DIAGNOSIS — E559 Vitamin D deficiency, unspecified: Secondary | ICD-10-CM

## 2021-08-11 LAB — POCT GLYCOSYLATED HEMOGLOBIN (HGB A1C): HbA1c, POC (controlled diabetic range): 5.1 % (ref 0.0–7.0)

## 2021-08-11 MED ORDER — TRESIBA FLEXTOUCH 100 UNIT/ML ~~LOC~~ SOPN: 30.0000 [IU] | PEN_INJECTOR | Freq: Every day | SUBCUTANEOUS | 3 refills | Status: DC

## 2021-08-11 NOTE — Progress Notes (Signed)
08/11/2021, 9:30 AM  Endocrinology follow-up note     Subjective:    Patient ID: [name], male    DOB: [birth year].  [name] is being seen in follow-up after he was seen in consultation for management of currently uncontrolled symptomatic diabetes requested by  Redmond [employer], MD.   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/2005   autologous stem cell transplant 12/2005   Obesity, morbid (more than 100 lbs over ideal weight or BMI > 40) ([place])    Venous insufficiency 04/29/2011    Past Surgical History:  Procedure Laterality Date   BACK SURGERY  2006   T6 vertebrae removed/titanium placed   CATARACT EXTRACTION W/PHACO Right 03/31/2019   Procedure: CATARACT EXTRACTION PHACO AND INTRAOCULAR LENS PLACEMENT ([place]);  Surgeon: Baruch Goldmann, MD;  Location: [facility];  Service: Ophthalmology;  Laterality: Right;  CDE: 3.21   CATARACT EXTRACTION W/PHACO Left 04/14/2019   Procedure: CATARACT EXTRACTION PHACO AND INTRAOCULAR LENS PLACEMENT ([facility]);  Surgeon: Baruch Goldmann, MD;  Location: [facility];  Service: Ophthalmology;  Laterality: Left;  left - pt knows to arrive at 7:45, CDE: 3.55   COLONOSCOPY  11/24/2004   Polyps in the left colon ablated/removed as described above.  Two submucosal lesions consistent with lipomas as described above not  manipulated./ Normal rectum   COLONOSCOPY  06/20/2012   MULTIPLE RECTAL AND COLONIC POLYPS   COLONOSCOPY N/A 03/08/2015   RMR: Capacious, redundant colon. Multiple colonic and rectosigmoid polyps removed. ablated as described above. colonic lipoma abnormal appearing terminal ileum likely  a variant of normal). Howeverwith history  biopsies obtained.    ESOPHAGOGASTRODUODENOSCOPY N/A 03/08/2015   RMR: Hiatal hernia Polypoid gastric mucosa with multiple gastric polyps. largest polyp removed via snare polypectomgy hemostasis clip placed at base. Status post gastric biopsy. Status post video capsule placement.    GIVENS CAPSULE STUDY N/A 03/08/2015   Procedure: GIVENS CAPSULE STUDY;  Surgeon: Daneil Dolin, MD;  Location: [facility];  Service: Endoscopy;  Laterality: N/A;   LIMBAL STEM CELL TRANSPLANT     LUNG BIOPSY  6/06   MULTIPLE TOOTH EXTRACTIONS  04/2005   port a cath placement     PORT-A-CATH REMOVAL  09/08/2012   Procedure: REMOVAL PORT-A-CATH;  Surgeon: Melrose Nakayama, MD;  Location: [facility] OR;  Service: Thoracic;  Laterality: N/A;    Social History   Socioeconomic History   Marital status: Married    Spouse name: Not on file   Number of children: 2   Years of education: Not on file   Highest education level: Not on file  Occupational History   Occupation: disability due to back    Employer: UNEMPLOYED  Tobacco Use   Smoking status: Former    Packs/day: 0.50    Years: 15.00    Pack years: 7.50  Types: Cigarettes    Quit date: 11/11/2002    Years since quitting: 18.7   Smokeless tobacco: Never  Vaping Use   Vaping Use: Never used  Substance and Sexual Activity   Alcohol use: No    Alcohol/week: 0.0 standard drinks   Drug use: No   Sexual activity: Yes    Birth control/protection: None  Other Topics Concern   Not on file  Social History Narrative   Married   No regular exercise   Social Determinants of Health   Financial Resource Strain: Not on file  Food Insecurity: Not on file  Transportation Needs: Not on file  Physical Activity: Not on file  Stress: Not on file  Social Connections: Not on file    Family History  Problem Relation Age of Onset   Cancer Mother        lung   Hypertension Mother    Hyperlipidemia Mother    Cancer  Father        prostate   Hypertension Father    Colon cancer Neg Hx    Diabetes Neg Hx     Outpatient Encounter Medications as of 08/11/2021  Medication Sig   apixaban (ELIQUIS) 5 MG TABS tablet Take 1 tablet (5 mg total) by mouth 2 (two) times daily.   B-D ULTRAFINE III SHORT PEN 31G X 8 MM MISC USE EVERY DAY   Blood Glucose Monitoring Suppl (ONE TOUCH ULTRA 2) w/Device KIT USE TO TEST BLOOD SUGAR ONCE D UTD   clotrimazole-betamethasone (LOTRISONE) cream Apply 1 application topically 2 (two) times daily as needed (leg infection).    ergocalciferol (VITAMIN D2) 1.25 MG (50000 UT) capsule Take 1 capsule (50,000 Units total) by mouth once a week.   folic acid (FOLVITE) 1 MG tablet TAKE 1 TABLET(1 MG) BY MOUTH DAILY   furosemide (LASIX) 20 MG tablet Take 20 mg by mouth daily as needed for fluid or edema.    Lancets (ONETOUCH DELICA PLUS CVELFY10F) MISC Use to check blood glucose twice daily, before breakfast and before bed   metFORMIN (GLUCOPHAGE) 500 MG tablet TAKE 1 TABLET(500 MG) BY MOUTH TWICE DAILY WITH A MEAL   ONETOUCH ULTRA test strip TEST TWICE DAILY   rosuvastatin (CRESTOR) 40 MG tablet Take 40 mg by mouth daily.   vitamin B-12 (CYANOCOBALAMIN) 1000 MCG tablet Take 2 tablets (2,000 mcg total) by mouth daily.   [DISCONTINUED] insulin degludec (TRESIBA FLEXTOUCH) 100 UNIT/ML FlexTouch Pen Inject 35 Units into the skin at bedtime.   insulin degludec (TRESIBA FLEXTOUCH) 100 UNIT/ML FlexTouch Pen Inject 30 Units into the skin at bedtime.   metoprolol tartrate (LOPRESSOR) 25 MG tablet Take 1 tablet (25 mg total) by mouth 2 (two) times daily.   No facility-administered encounter medications on file as of 08/11/2021.    ALLERGIES: Allergies  Allergen Reactions   Penicillins Rash    Has patient had a PCN reaction causing immediate rash, facial/tongue/throat swelling, SOB or lightheadedness with hypotension: Unknown Has patient had a PCN reaction causing severe rash involving mucus  membranes or skin necrosis: Unknown Has patient had a PCN reaction that required hospitalization: Unknown Has patient had a PCN reaction occurring within the last 10 years: No If all of the above answers are "NO", then may proceed with Cephalosporin use.     VACCINATION STATUS: Immunization History  Administered Date(s) Administered   Fluad Quad(high Dose 65+) 08/04/2019, 06/28/2020   Influenza Split 06/08/2012   Influenza,inj,Quad PF,6+ Mos 07/14/2013, 06/20/2015   Influenza-Unspecified 07/15/2011, 06/02/2012  Moderna Sars-Covid-2 Vaccination 11/03/2019, 12/05/2019    Diabetes He presents for his follow-up diabetic visit. He has type 2 diabetes mellitus. Onset time: He was diagnosed in March 2021 after at least 2 years of exposure to steroids related to his B-cell lymphoma which required autologous stem cell transplant bone marrow. His disease course has been stable. There are no hypoglycemic associated symptoms. Pertinent negatives for hypoglycemia include no confusion, headaches, pallor or seizures. Pertinent negatives for diabetes include no blurred vision, no chest pain, no fatigue, no polydipsia, no polyphagia, no polyuria and no weakness. There are no hypoglycemic complications. Symptoms are stable. Diabetic complications include nephropathy and PVD. Risk factors for coronary artery disease include diabetes mellitus, dyslipidemia, family history, male sex, obesity, tobacco exposure and sedentary lifestyle. Current diabetic treatment includes insulin injections and oral agent (monotherapy). He is compliant with treatment most of the time. His weight is fluctuating minimally. He is following a generally healthy diet. When asked about meal planning, he reported none. He has not had a previous visit with a dietitian. He rarely participates in exercise. His home blood glucose trend is fluctuating minimally. His breakfast blood glucose range is generally 90-110 mg/dl. His bedtime blood glucose  range is generally 110-130 mg/dl. (He presents today with his meter and logs showing tightening fasting and at goal postprandial glycemic profile.  His POCT A1c today is 5.1%, essentially unchanged from previous visit of 5.3%.  He did have rare occasions of low glucose, he associates this with his meal timing.  Analysis of his meter shows 7-day average of 103, 14-day average of 111, 30-day average of 112.) An ACE inhibitor/angiotensin II receptor blocker is not being taken. He sees a podiatrist.Eye exam is current.  Hyperlipidemia This is a chronic problem. The current episode started more than 1 year ago. The problem is controlled. Recent lipid tests were reviewed and are normal. Exacerbating diseases include chronic renal disease, diabetes and obesity. Factors aggravating his hyperlipidemia include beta blockers and fatty foods. Pertinent negatives include no chest pain, myalgias or shortness of breath. Current antihyperlipidemic treatment includes statins. The current treatment provides moderate improvement of lipids. Compliance problems include adherence to exercise and adherence to diet.  Risk factors for coronary artery disease include dyslipidemia, diabetes mellitus, family history, obesity, male sex, hypertension and a sedentary lifestyle.  Hypertension This is a chronic problem. The current episode started more than 1 year ago. The problem has been resolved since onset. The problem is controlled. Pertinent negatives include no blurred vision, chest pain, headaches, neck pain, palpitations or shortness of breath. There are no associated agents to hypertension. Risk factors for coronary artery disease include dyslipidemia, diabetes mellitus, male gender, obesity, sedentary lifestyle, smoking/tobacco exposure and family history. Past treatments include beta blockers and diuretics. The current treatment provides mild improvement. Compliance problems include diet and exercise.  Hypertensive end-organ damage  includes kidney disease and PVD. Identifiable causes of hypertension include chronic renal disease.   Review of systems  Constitutional: + Minimally fluctuating body weight,  current Body mass index is 36.65 kg/m. , no fatigue, no subjective hyperthermia, no subjective hypothermia Eyes: no blurry vision, no xerophthalmia ENT: no sore throat, no nodules palpated in throat, no dysphagia/odynophagia, no hoarseness Cardiovascular: no chest pain, no shortness of breath, no palpitations, + leg swelling with leaking- wears compression stockings Respiratory: no cough, no shortness of breath Gastrointestinal: no nausea/vomiting/diarrhea Musculoskeletal: no muscle/joint aches, walks with cane Skin: no rashes, no hyperemia Neurological: no tremors, no numbness, no tingling, no dizziness  Psychiatric: no depression, no anxiety  Objective:    BP 129/79    Pulse (!) 55    Ht 5' 11"  (1.803 m)    Wt 262 lb 12.8 oz (119.2 kg)    SpO2 99%    BMI 36.65 kg/m   Wt Readings from Last 3 Encounters:  08/11/21 262 lb 12.8 oz (119.2 kg)  05/29/21 254 lb (115.2 kg)  05/02/21 257 lb 3.2 oz (116.7 kg)    BP Readings from Last 3 Encounters:  08/11/21 129/79  05/29/21 133/80  05/02/21 140/70      Physical Exam- Limited  Constitutional:  Body mass index is 36.65 kg/m. , not in acute distress, normal state of mind Eyes:  EOMI, no exophthalmos Neck: Supple Cardiovascular: RRR, no murmurs, rubs, or gallops, no edema Respiratory: Adequate breathing efforts, no crackles, rales, rhonchi, or wheezing Musculoskeletal: no gross deformities, strength intact in all four extremities, no gross restriction of joint movements, walks with cane Skin:  no rashes, no hyperemia Neurological: no tremor with outstretched hands    CMP ( most recent) CMP     Component Value Date/Time   NA 142 07/30/2021 0923   K 4.4 07/30/2021 0923   CL 103 07/30/2021 0923   CO2 25 07/30/2021 0923   GLUCOSE 74 07/30/2021 0923    GLUCOSE 78 05/02/2021 1420   BUN 18 07/30/2021 0923   CREATININE 1.57 (H) 07/30/2021 0923   CALCIUM 9.1 07/30/2021 0923   PROT 6.3 07/30/2021 0923   ALBUMIN 3.9 07/30/2021 0923   AST 23 07/30/2021 0923   ALT 24 07/30/2021 0923   ALKPHOS 71 07/30/2021 0923   BILITOT 0.4 07/30/2021 0923   GFRNONAA 54 (L) 05/02/2021 1420   GFRAA 73 07/02/2020 0909    Diabetic Labs (most recent): Lab Results  Component Value Date   HGBA1C 5.1 08/11/2021   HGBA1C 5.3 04/03/2021   HGBA1C 5.3 12/02/2020    Lab Results  Component Value Date   TSH 2.650 07/30/2021   TSH 2.760 07/02/2020   TSH 1.122 09/08/2017   FREET4 1.12 07/30/2021   FREET4 1.06 07/02/2020      Assessment & Plan:   1) Uncontrolled type 2 diabetes mellitus with hyperglycemia (HCC)  - Justice has currently uncontrolled symptomatic type 2 DM since  70 years of age.  He presents today with his meter and logs showing tightening fasting and at goal postprandial glycemic profile.  His POCT A1c today is 5.1%, essentially unchanged from previous visit of 5.3%.  He did have rare occasions of low glucose, he associates this with his meal timing.  Analysis of his meter shows 7-day average of 103, 14-day average of 111, 30-day average of 112.  - Recent labs reviewed.  - I had a long discussion with him about the progressive nature of diabetes and the pathology behind its complications. -his diabetes is complicated by obesity/sedentary life, peripheral arterial disease with venous stasis and he remains at a high risk for more acute and chronic complications which include CAD, CVA, CKD, retinopathy, and neuropathy. These are all discussed in detail with him.  - Nutritional counseling repeated at each appointment due to patients tendency to fall back in to old habits.  - The patient admits there is a room for improvement in their diet and drink choices. -  Suggestion is made for the patient to avoid simple carbohydrates from their diet  including Cakes, Sweet Desserts / Pastries, Ice Cream, Soda (diet and regular), Sweet Tea, Candies, Chips, Cookies,  Sweet Pastries, Store Bought Juices, Alcohol in Excess of 1-2 drinks a day, Artificial Sweeteners, Coffee Creamer, and "Sugar-free" Products. This will help patient to have stable blood glucose profile and potentially avoid unintended weight gain.   - I encouraged the patient to switch to unprocessed or minimally processed complex starch and increased protein intake (animal or plant source), fruits, and vegetables.   - Patient is advised to stick to a routine mealtimes to eat 3 meals a day and avoid unnecessary snacks (to snack only to correct hypoglycemia).  - I have approached him with the following individualized plan to manage  his diabetes and patient agrees:   -Given his presentation with controlled glycemic profile, he will not need bolus insulin at this time.    -He is advised to lower his dose of Tresiba to 30units SQ nightly due to tight fasting glucose readings.  He is also advised to continue Metformin 500 mg po twice daily with meals (renal function stable at this time).  -He is encouraged to continue monitoring blood glucose levels twice daily, before breakfast and before bed and report blood glucose levels less than 70 or greater than 200 for 3 tests in a row.  - he is not a candidate for TZDs, SGLT2 inhibitors due to compromised peripheral circulation.    - he will be considered for incretin therapy as appropriate next visit.  - Specific targets for  A1c;  LDL, HDL,  and Triglycerides were discussed with the patient.  2) Blood Pressure /Hypertension:  His blood pressure is controlled to target.  He is advised to continue Lasix 20 mg po daily as needed for fluid, and Metoprolol 25 mg po twice daily.  He is advised to adopt a low sodium diet.  3) Lipids/Hyperlipidemia:  His most recent lipid panel from 07/30/21 shows controlled LDL of 65.  He is advised to continue  Crestor 40 mg po daily at bedtime.  Side effects and precautions discussed with him.   4)  Weight/Diet:  His Body mass index is 36.65 kg/m.  -   clearly complicating his diabetes care.   he is  a candidate for weight loss. I discussed with him the fact that loss of 5 - 10% of his  current body weight will have the most impact on his diabetes management.  Exercise, and detailed carbohydrates information provided  -  detailed on discharge instructions.  5) Chronic Care/Health Maintenance: -he is on Statin medications and is encouraged to initiate and continue to follow up with Ophthalmology, Dentist,  Podiatrist at least yearly or according to recommendations, and advised to stay away from smoking. I have recommended yearly flu vaccine and pneumonia vaccine at least every 5 years; moderate intensity exercise for up to 150 minutes weekly; and  sleep for at least 7 hours a day.  - he is advised to maintain close follow up with Redmond School, MD for primary care needs, as well as his other providers for optimal and coordinated care.     I spent 30 minutes in the care of the patient today including review of labs from [facility], Lipids, Thyroid Function, Hematology (current and previous including abstractions from other facilities); face-to-face time discussing  his blood glucose readings/logs, discussing hypoglycemia and hyperglycemia episodes and symptoms, medications doses, his options of short and long term treatment based on the latest standards of care / guidelines;  discussion about incorporating lifestyle medicine;  and documenting the encounter.    Please refer to Patient Instructions for Blood  Glucose Monitoring and Insulin/Medications Dosing Guide"  in media tab for additional information. Please  also refer to " Patient Self Inventory" in the Media  tab for reviewed elements of pertinent patient history.  [name] participated in the discussions, expressed understanding, and voiced agreement  with the above plans.  All questions were answered to his satisfaction. he is encouraged to contact clinic should he have any questions or concerns prior to his return visit.   Follow up plan: - Return in about 6 months (around 02/09/2022) for Diabetes F/U with A1c in office, No previsit labs, Bring meter and logs.    [name], [facility] [address] [place], [place] [address] Phone: [phone] Fax: [phone]  08/11/2021, 9:30 AM

## 2021-08-11 NOTE — Patient Instructions (Signed)

## 2021-08-18 ENCOUNTER — Other Ambulatory Visit: Payer: Self-pay | Admitting: Nurse Practitioner

## 2021-09-03 ENCOUNTER — Encounter: Payer: Self-pay | Admitting: Vascular Surgery

## 2021-09-03 ENCOUNTER — Ambulatory Visit (HOSPITAL_COMMUNITY)
Admission: RE | Admit: 2021-09-03 | Discharge: 2021-09-03 | Disposition: A | Discharge status: Home / Self Care | Payer: Medicare Other | Source: Ambulatory Visit | Attending: Vascular Surgery | Admitting: Vascular Surgery

## 2021-09-03 ENCOUNTER — Ambulatory Visit: Payer: Medicare Other | Admitting: Vascular Surgery

## 2021-09-03 ENCOUNTER — Other Ambulatory Visit: Payer: Self-pay

## 2021-09-03 VITALS — BP 126/63 | HR 60 | Resp 18 | Ht 71.0 in | Wt 264.0 lb

## 2021-09-03 DIAGNOSIS — I872 Venous insufficiency (chronic) (peripheral): Secondary | ICD-10-CM | POA: Diagnosis not present

## 2021-09-03 NOTE — Progress Notes (Signed)
REASON FOR VISIT:   Follow-up of chronic venous insufficiency and lymphedema.  MEDICAL ISSUES:   COMBINED CHRONIC VENOUS INSUFFICIENCY AND LYMPHEDEMA: This patient has CEAP C4b venous disease.  There have been no significant changes in his symptoms.  We have again discussed the importance of leg elevation, compression therapy, exercise, and trying to avoid prolonged sitting and standing.  We also discussed the importance in maintaining a healthy weight.  In addition I encouraged him to keep his skin well lubricated.  He has deep and superficial venous reflux.  Based on my exam of his veins today I am not sure that addressing his small saphenous vein or great saphenous vein would have a big impact on his venous hypertension although that could change in the future.  We also discussed using a lymphatic compression pump and I think he would be a good candidate for that.  I explained how this ideally should be used 2 hours a day.  We also talked about the potential cost for this.  He would like to hold off on this for now.  Certainly this is something we could revisit if he changes his mind.  I would like to see him back in 1 year.  I have ordered a venous reflux study on the left at that time as he has been having symptoms on the left leg also.  He knows to call sooner if he has problems.  HPI:   [name] is a pleasant 71 y.o. male who I saw on 05/29/2021 with combined chronic venous insufficiency and lymphedema.  He had CEAP C4b venous disease.  When I saw him last, we discussed importance of leg elevation, compression therapy, avoiding prolonged sitting and standing, exercise, and weight loss.  In addition I encouraged him to keep his skin well lubricated.  We were considering possible laser ablation of the right great saphenous and the vein and right small saphenous vein.  He comes in for a follow-up visit.  Since I saw him last, he has been wearing his compression stockings.  He does elevate  his legs some.  His swelling is about the same.  He has had no significant changes in his medical history.  He describes some aching pain and heaviness in his legs which is aggravated by sitting and standing and relieved with elevation.  Past Medical History:  Diagnosis Date   Asthma    as child   Diabetes mellitus without complication ([place])    DLBCL (diffuse large B cell lymphoma) ([place]) 02/28/2009   Edema    Heart failure, diastolic, chronic ([place])    patient denies   Hyperlipidemia, mixed    Hypertension    IDA (iron deficiency anemia)    Large cell lymphoma ([place]) 01/2005   autologous stem cell transplant 12/2005   Obesity, morbid (more than 100 lbs over ideal weight or BMI > 40) ([place])    Venous insufficiency 04/29/2011    Family History  Problem Relation Age of Onset   Cancer Mother        lung   Hypertension Mother    Hyperlipidemia Mother    Cancer Father        prostate   Hypertension Father    Colon cancer Neg Hx    Diabetes Neg Hx     SOCIAL HISTORY: Social History   Tobacco Use   Smoking status: Former    Packs/day: 0.50    Years: 15.00    Pack years: 7.50  Types: Cigarettes    Quit date: 11/11/2002    Years since quitting: 18.8   Smokeless tobacco: Never  Substance Use Topics   Alcohol use: No    Alcohol/week: 0.0 standard drinks    Allergies  Allergen Reactions   Penicillins Rash    Has patient had a PCN reaction causing immediate rash, facial/tongue/throat swelling, SOB or lightheadedness with hypotension: Unknown Has patient had a PCN reaction causing severe rash involving mucus membranes or skin necrosis: Unknown Has patient had a PCN reaction that required hospitalization: Unknown Has patient had a PCN reaction occurring within the last 10 years: No If all of the above answers are "NO", then may proceed with Cephalosporin use.     Current Outpatient Medications  Medication Sig Dispense Refill   apixaban (ELIQUIS) 5 MG TABS tablet Take 1 tablet  (5 mg total) by mouth 2 (two) times daily. 60 tablet 11   B-D ULTRAFINE III SHORT PEN 31G X 8 MM MISC USE EVERY DAY 100 each 3   Blood Glucose Monitoring Suppl (ONE TOUCH ULTRA 2) w/Device KIT USE TO TEST BLOOD SUGAR ONCE D UTD     clotrimazole-betamethasone (LOTRISONE) cream Apply 1 application topically 2 (two) times daily as needed (leg infection).      ergocalciferol (VITAMIN D2) 1.25 MG (50000 UT) capsule Take 1 capsule (50,000 Units total) by mouth once a week. 16 capsule 3   folic acid (FOLVITE) 1 MG tablet TAKE 1 TABLET(1 MG) BY MOUTH DAILY 30 tablet 4   furosemide (LASIX) 20 MG tablet Take 20 mg by mouth daily as needed for fluid or edema.   0   insulin degludec (TRESIBA FLEXTOUCH) 100 UNIT/ML FlexTouch Pen Inject 30 Units into the skin at bedtime. 30 mL 3   Lancets (ONETOUCH DELICA PLUS [id number]) MISC Use to check blood glucose twice daily, before breakfast and before bed 300 each 3   metFORMIN (GLUCOPHAGE) 500 MG tablet TAKE 1 TABLET(500 MG) BY MOUTH TWICE DAILY WITH A MEAL 180 tablet 0   metoprolol tartrate (LOPRESSOR) 25 MG tablet Take 1 tablet (25 mg total) by mouth 2 (two) times daily. 180 tablet 3   ONETOUCH ULTRA test strip TEST TWICE DAILY 100 strip 3   rosuvastatin (CRESTOR) 40 MG tablet Take 40 mg by mouth daily.     vitamin B-12 (CYANOCOBALAMIN) 1000 MCG tablet Take 2 tablets (2,000 mcg total) by mouth daily. 90 tablet 3   No current facility-administered medications for this visit.    REVIEW OF SYSTEMS:  [X]  denotes positive finding, [ ]  denotes negative finding Cardiac  Comments:  Chest pain or chest pressure:    Shortness of breath upon exertion:    Short of breath when lying flat:    Irregular heart rhythm:        Vascular    Pain in calf, thigh, or hip brought on by ambulation:    Pain in feet at night that wakes you up from your sleep:     Blood clot in your veins:    Leg swelling:  x       Pulmonary    Oxygen at home:    Productive cough:     Wheezing:          Neurologic    Sudden weakness in arms or legs:     Sudden numbness in arms or legs:     Sudden onset of difficulty speaking or slurred speech:    Temporary loss of vision in one eye:  Problems with dizziness:         Gastrointestinal    Blood in stool:     Vomited blood:         Genitourinary    Burning when urinating:     Blood in urine:        Psychiatric    Major depression:         Hematologic    Bleeding problems:    Problems with blood clotting too easily:        Skin    Rashes or ulcers:        Constitutional    Fever or chills:     PHYSICAL EXAM:   There were no vitals filed for this visit.  GENERAL: The patient is a well-nourished male, in no acute distress. The vital signs are documented above. CARDIAC: There is a regular rate and rhythm.  VASCULAR: He has significant bilateral lower extremity swelling with hyperpigmentation and lipodermatosclerosis.     I did look at his right small saphenous vein and right great saphenous vein myself with the Sono[employer].  There was significant reflux throughout the small saphenous vein diameter was about 3-1/2 to 4 mm throughout.  The great saphenous vein was not especially dilated today.  PULMONARY: There is good air exchange bilaterally without wheezing or rales. ABDOMEN: Soft and non-tender with normal pitched bowel sounds.  MUSCULOSKELETAL: There are no major deformities or cyanosis. NEUROLOGIC: No focal weakness or paresthesias are detected. SKIN: There are no ulcers or rashes noted. PSYCHIATRIC: The patient has a normal affect.  DATA:    VENOUS DUPLEX: I have independently interpreted his venous duplex scan today.  This was of the right lower extremity only.  On the right side there was no evidence of DVT.  There was deep venous reflux involving the common femoral vein, femoral vein, and popliteal vein.  There was superficial venous reflux in the right great saphenous vein in the proximal thigh only.   The vein measured up to 5 mm in diameter.  There was also reflux in the small saphenous vein although the vein was not especially dilated.  Diameters ranged from 3.1-3.7 mm.     Deitra Mayo [facility] [phone]

## 2021-09-04 ENCOUNTER — Ambulatory Visit: Payer: Medicare Other | Admitting: Vascular Surgery

## 2021-09-04 ENCOUNTER — Encounter (HOSPITAL_COMMUNITY): Payer: Medicare Other

## 2021-09-05 ENCOUNTER — Other Ambulatory Visit: Payer: Self-pay | Admitting: Nurse Practitioner

## 2021-09-23 DIAGNOSIS — I2510 Atherosclerotic heart disease of native coronary artery without angina pectoris: Secondary | ICD-10-CM | POA: Diagnosis not present

## 2021-09-23 DIAGNOSIS — E1165 Type 2 diabetes mellitus with hyperglycemia: Secondary | ICD-10-CM | POA: Diagnosis not present

## 2021-09-23 DIAGNOSIS — E7849 Other hyperlipidemia: Secondary | ICD-10-CM | POA: Diagnosis not present

## 2021-09-28 ENCOUNTER — Other Ambulatory Visit (HOSPITAL_COMMUNITY): Payer: Self-pay | Admitting: Physician Assistant

## 2021-09-29 ENCOUNTER — Encounter (HOSPITAL_COMMUNITY): Payer: Self-pay | Admitting: Hematology

## 2021-12-15 ENCOUNTER — Other Ambulatory Visit: Payer: Self-pay | Admitting: "Endocrinology

## 2022-02-09 ENCOUNTER — Ambulatory Visit: Payer: Medicare Other | Admitting: Nurse Practitioner

## 2022-02-09 ENCOUNTER — Encounter: Payer: Self-pay | Admitting: Nurse Practitioner

## 2022-02-09 VITALS — BP 128/82 | HR 58 | Ht 71.0 in | Wt 258.0 lb

## 2022-02-09 DIAGNOSIS — Z794 Long term (current) use of insulin: Secondary | ICD-10-CM

## 2022-02-09 DIAGNOSIS — I10 Essential (primary) hypertension: Secondary | ICD-10-CM

## 2022-02-09 DIAGNOSIS — N1831 Chronic kidney disease, stage 3a: Secondary | ICD-10-CM

## 2022-02-09 DIAGNOSIS — E782 Mixed hyperlipidemia: Secondary | ICD-10-CM | POA: Diagnosis not present

## 2022-02-09 DIAGNOSIS — E559 Vitamin D deficiency, unspecified: Secondary | ICD-10-CM | POA: Diagnosis not present

## 2022-02-09 DIAGNOSIS — E1122 Type 2 diabetes mellitus with diabetic chronic kidney disease: Secondary | ICD-10-CM

## 2022-02-09 LAB — POCT GLYCOSYLATED HEMOGLOBIN (HGB A1C): HbA1c, POC (controlled diabetic range): 5.3 % (ref 0.0–7.0)

## 2022-02-09 MED ORDER — TRESIBA FLEXTOUCH 100 UNIT/ML ~~LOC~~ SOPN: 30.0000 [IU] | PEN_INJECTOR | Freq: Every day | SUBCUTANEOUS | 3 refills | Status: DC

## 2022-02-09 MED ORDER — METFORMIN HCL 500 MG PO TABS: ORAL_TABLET | ORAL | 3 refills | Status: DC

## 2022-02-09 NOTE — Patient Instructions (Signed)
Diabetes Mellitus and Foot Care Foot care is an important part of your health, especially when you have diabetes. Diabetes may cause you to have problems because of poor blood flow (circulation) to your feet and legs, which can cause your skin to: Become thinner and drier. Break more easily. Heal more slowly. Peel and crack. You may also have nerve damage (neuropathy) in your legs and feet, causing decreased feeling in them. This means that you may not notice minor injuries to your feet that could lead to more serious problems. Noticing and addressing any potential problems early is the best way to prevent future foot problems. How to care for your feet Foot hygiene  Wash your feet daily with warm water and mild soap. Do not use hot water. Then, pat your feet and the areas between your toes until they are completely dry. Do not soak your feet as this can dry your skin. Trim your toenails straight across. Do not dig under them or around the cuticle. File the edges of your nails with an emery board or nail file. Apply a moisturizing lotion or petroleum jelly to the skin on your feet and to dry, brittle toenails. Use lotion that does not contain alcohol and is unscented. Do not apply lotion between your toes. Shoes and socks Wear clean socks or stockings every day. Make sure they are not too tight. Do not wear knee-high stockings since they may decrease blood flow to your legs. Wear shoes that fit properly and have enough cushioning. Always look in your shoes before you put them on to be sure there are no objects inside. To break in new shoes, wear them for just a few hours a day. This prevents injuries on your feet. Wounds, scrapes, corns, and calluses  Check your feet daily for blisters, cuts, bruises, sores, and redness. If you cannot see the bottom of your feet, use a mirror or ask someone for help. Do not cut corns or calluses or try to remove them with medicine. If you find a minor scrape,  cut, or break in the skin on your feet, keep it and the skin around it clean and dry. You may clean these areas with mild soap and water. Do not clean the area with peroxide, alcohol, or iodine. If you have a wound, scrape, corn, or callus on your foot, look at it several times a day to make sure it is healing and not infected. Check for: Redness, swelling, or pain. Fluid or blood. Warmth. Pus or a bad smell. General tips Do not cross your legs. This may decrease blood flow to your feet. Do not use heating pads or hot water bottles on your feet. They may burn your skin. If you have lost feeling in your feet or legs, you may not know this is happening until it is too late. Protect your feet from hot and cold by wearing shoes, such as at the beach or on hot pavement. Schedule a complete foot exam at least once a year (annually) or more often if you have foot problems. Report any cuts, sores, or bruises to your health care provider immediately. Where to find more information American Diabetes Association: www.diabetes.org Association of Diabetes [employer] & Education Specialists: www.diabeteseducator.org Contact a health care provider if: You have a medical condition that increases your risk of infection and you have any cuts, sores, or bruises on your feet. You have an injury that is not healing. You have redness on your legs or feet. You   feel burning or tingling in your legs or feet. You have pain or cramps in your legs and feet. Your legs or feet are numb. Your feet always feel cold. You have pain around any toenails. Get help right away if: You have a wound, scrape, corn, or callus on your foot and: You have pain, swelling, or redness that gets worse. You have fluid or blood coming from the wound, scrape, corn, or callus. Your wound, scrape, corn, or callus feels warm to the touch. You have pus or a bad smell coming from the wound, scrape, corn, or callus. You have a fever. You have a red  line going up your leg. Summary Check your feet every day for blisters, cuts, bruises, sores, and redness. Apply a moisturizing lotion or petroleum jelly to the skin on your feet and to dry, brittle toenails. Wear shoes that fit properly and have enough cushioning. If you have foot problems, report any cuts, sores, or bruises to your health care provider immediately. Schedule a complete foot exam at least once a year (annually) or more often if you have foot problems. This information is not intended to replace advice given to you by your health care provider. Make sure you discuss any questions you have with your health care provider. Document Revised: 02/29/2020 Document Reviewed: 02/29/2020 [employer] Patient Education  2023 [employer]  

## 2022-02-09 NOTE — Progress Notes (Signed)
02/09/2022, 9:54 AM  Endocrinology follow-up note     Subjective:    Patient ID: LEBERT LOVERN, male    DOB: 1951-06-05.  [name] is being seen in follow-up after he was seen in consultation for management of currently uncontrolled symptomatic diabetes requested by  Redmond School, MD.   Past Medical History:  Diagnosis Date   Asthma    as child   Diabetes mellitus without complication ([place])    DLBCL (diffuse large B cell lymphoma) ([place]) 02/28/2009   Edema    Heart failure, diastolic, chronic ([place])    patient denies   Hyperlipidemia, mixed    Hypertension    IDA (iron deficiency anemia)    Large cell lymphoma ([place]) 01/2005   autologous stem cell transplant 12/2005   Obesity, morbid (more than 100 lbs over ideal weight or BMI > 40) ([place])    Venous insufficiency 04/29/2011    Past Surgical History:  Procedure Laterality Date   BACK SURGERY  2006   T6 vertebrae removed/titanium placed   CATARACT EXTRACTION W/PHACO Right 03/31/2019   Procedure: CATARACT EXTRACTION PHACO AND INTRAOCULAR LENS PLACEMENT ([place]);  Surgeon: Baruch Goldmann, MD;  Location: [facility];  Service: Ophthalmology;  Laterality: Right;  CDE: 3.21   CATARACT EXTRACTION W/PHACO Left 04/14/2019   Procedure: CATARACT EXTRACTION PHACO AND INTRAOCULAR LENS PLACEMENT ([facility]);  Surgeon: Baruch Goldmann, MD;  Location: [facility];  Service: Ophthalmology;  Laterality: Left;  left - pt knows to arrive at 7:45, CDE: 3.55   COLONOSCOPY  11/24/2004   Polyps in the left colon ablated/removed as described above.  Two submucosal lesions consistent with lipomas as described above not  manipulated./ Normal rectum   COLONOSCOPY  06/20/2012   MULTIPLE RECTAL AND COLONIC POLYPS   COLONOSCOPY N/A 03/08/2015   RMR: Capacious, redundant colon. Multiple colonic and rectosigmoid polyps removed. ablated as described above. colonic lipoma abnormal appearing terminal ileum likely a  variant of normal). Howeverwith history  biopsies obtained.    ESOPHAGOGASTRODUODENOSCOPY N/A 03/08/2015   RMR: Hiatal hernia Polypoid gastric mucosa with multiple gastric polyps. largest polyp removed via snare polypectomgy hemostasis clip placed at base. Status post gastric biopsy. Status post video capsule placement.    GIVENS CAPSULE STUDY N/A 03/08/2015   Procedure: GIVENS CAPSULE STUDY;  Surgeon: Daneil Dolin, MD;  Location: [facility];  Service: Endoscopy;  Laterality: N/A;   LIMBAL STEM CELL TRANSPLANT     LUNG BIOPSY  6/06   MULTIPLE TOOTH EXTRACTIONS  04/2005   port a cath placement     PORT-A-CATH REMOVAL  09/08/2012   Procedure: REMOVAL PORT-A-CATH;  Surgeon: Melrose Nakayama, MD;  Location: [facility] OR;  Service: Thoracic;  Laterality: N/A;    Social History   Socioeconomic History   Marital status: Married    Spouse name: Not on file   Number of children: 2   Years of education: Not on file   Highest education level: Not on file  Occupational History   Occupation: disability due to back    Employer: UNEMPLOYED  Tobacco Use   Smoking status: Former    Packs/day: 0.50    Years: 15.00    Total pack years:  7.50    Types: Cigarettes    Quit date: 11/11/2002    Years since quitting: 19.2   Smokeless tobacco: Never  Vaping Use   Vaping Use: Never used  Substance and Sexual Activity   Alcohol use: No    Alcohol/week: 0.0 standard drinks of alcohol   Drug use: No   Sexual activity: Yes    Birth control/protection: None  Other Topics Concern   Not on file  Social History Narrative   Married   No regular exercise   Social Determinants of Health   Financial Resource Strain: Low Risk  (06/28/2020)   Overall Financial Resource Strain (CARDIA)    Difficulty of Paying Living Expenses: Not hard at all  Food Insecurity: No Food Insecurity (06/28/2020)   Hunger Vital Sign    Worried About Running Out of Food in the Last Year: Never true    Ran Out of Food in the Last  Year: Never true  Transportation Needs: No Transportation Needs (06/28/2020)   PRAPARE - Hydrologist (Medical): No    Lack of Transportation (Non-Medical): No  Physical Activity: Inactive (06/28/2020)   Exercise Vital Sign    Days of Exercise per Week: 0 days    Minutes of Exercise per Session: 0 min  Stress: No Stress Concern Present (06/28/2020)   [place]    Feeling of Stress : Not at all  Social Connections: Moderately Integrated (06/28/2020)   Social Connection and Isolation Panel [NHANES]    Frequency of Communication with Friends and Family: More than three times a week    Frequency of Social Gatherings with Friends and Family: Twice a week    Attends Religious Services: More than 4 times per year    Active Member of [employer] or Organizations: No    Attends Archivist Meetings: Never    Marital Status: Married    Family History  Problem Relation Age of Onset   Cancer Mother        lung   Hypertension Mother    Hyperlipidemia Mother    Cancer Father        prostate   Hypertension Father    Colon cancer Neg Hx    Diabetes Neg Hx     Outpatient Encounter Medications as of 02/09/2022  Medication Sig   apixaban (ELIQUIS) 5 MG TABS tablet Take 1 tablet (5 mg total) by mouth 2 (two) times daily.   B-D ULTRAFINE III SHORT PEN 31G X 8 MM MISC USE EVERY DAY   Blood Glucose Monitoring Suppl (ONE TOUCH ULTRA 2) w/Device KIT USE TO TEST BLOOD SUGAR ONCE D UTD   clotrimazole-betamethasone (LOTRISONE) cream Apply 1 application topically 2 (two) times daily as needed (leg infection).    ergocalciferol (VITAMIN D2) 1.25 MG (50000 UT) capsule Take 1 capsule (50,000 Units total) by mouth once a week.   folic acid (FOLVITE) 1 MG tablet TAKE 1 TABLET(1 MG) BY MOUTH DAILY   furosemide (LASIX) 20 MG tablet Take 20 mg by mouth daily as needed for fluid or edema.    insulin degludec (TRESIBA  FLEXTOUCH) 100 UNIT/ML FlexTouch Pen Inject 30 Units into the skin at bedtime.   Lancets (ONETOUCH DELICA PLUS [id number]) MISC TEST TWICE DAILY   metFORMIN (GLUCOPHAGE) 500 MG tablet TAKE 1 TABLET(500 MG) BY MOUTH TWICE DAILY WITH A MEAL   metoprolol tartrate (LOPRESSOR) 25 MG tablet Take 1 tablet (25 mg total) by mouth 2 (  two) times daily.   ONETOUCH ULTRA test strip TEST TWICE DAILY   rosuvastatin (CRESTOR) 40 MG tablet Take 40 mg by mouth daily.   vitamin B-12 (CYANOCOBALAMIN) 1000 MCG tablet Take 2 tablets (2,000 mcg total) by mouth daily.   [DISCONTINUED] insulin degludec (TRESIBA FLEXTOUCH) 100 UNIT/ML FlexTouch Pen Inject 30 Units into the skin at bedtime.   [DISCONTINUED] metFORMIN (GLUCOPHAGE) 500 MG tablet TAKE 1 TABLET(500 MG) BY MOUTH TWICE DAILY WITH A MEAL   No facility-administered encounter medications on file as of 02/09/2022.    ALLERGIES: Allergies  Allergen Reactions   Penicillins Rash    Has patient had a PCN reaction causing immediate rash, facial/tongue/throat swelling, SOB or lightheadedness with hypotension: Unknown Has patient had a PCN reaction causing severe rash involving mucus membranes or skin necrosis: Unknown Has patient had a PCN reaction that required hospitalization: Unknown Has patient had a PCN reaction occurring within the last 10 years: No If all of the above answers are "NO", then may proceed with Cephalosporin use.     VACCINATION STATUS: Immunization History  Administered Date(s) Administered   Fluad Quad(high Dose 65+) 08/04/2019, 06/28/2020   Influenza Split 06/08/2012   Influenza,inj,Quad PF,6+ Mos 07/14/2013, 06/20/2015   Influenza-Unspecified 07/15/2011, 06/02/2012   Moderna Sars-Covid-2 Vaccination 11/03/2019, 12/05/2019    Diabetes He presents for his follow-up diabetic visit. He has type 2 diabetes mellitus. Onset time: He was diagnosed in March 2021 after at least 2 years of exposure to steroids related to his B-cell lymphoma  which required autologous stem cell transplant bone marrow. His disease course has been stable. There are no hypoglycemic associated symptoms. Pertinent negatives for hypoglycemia include no confusion, headaches, pallor or seizures. Pertinent negatives for diabetes include no blurred vision, no chest pain, no fatigue, no polydipsia, no polyphagia, no polyuria and no weakness. There are no hypoglycemic complications. Symptoms are stable. Diabetic complications include nephropathy and PVD. Risk factors for coronary artery disease include diabetes mellitus, dyslipidemia, family history, male sex, obesity, tobacco exposure and sedentary lifestyle. Current diabetic treatment includes insulin injections and oral agent (monotherapy). He is compliant with treatment most of the time. His weight is decreasing steadily. He is following a generally healthy diet. When asked about meal planning, he reported none. He has not had a previous visit with a dietitian. He rarely participates in exercise. His home blood glucose trend is fluctuating minimally. His breakfast blood glucose range is generally 90-110 mg/dl. His bedtime blood glucose range is generally 110-130 mg/dl. (He presents today with his meter and logs showing at goal glycemic profile overall.  His POCT A1c today is 5.3%, essentially unchanged from previous visit.  He denies any significant hypoglycemia.  Analysis of his meter shows 7-day average of 114, 14-day average of 120, 30-day average of 120.) An ACE inhibitor/angiotensin II receptor blocker is not being taken. He sees a podiatrist.Eye exam is current.  Hyperlipidemia This is a chronic problem. The current episode started more than 1 year ago. The problem is controlled. Recent lipid tests were reviewed and are normal. Exacerbating diseases include chronic renal disease, diabetes and obesity. Factors aggravating his hyperlipidemia include beta blockers and fatty foods. Pertinent negatives include no chest pain,  myalgias or shortness of breath. Current antihyperlipidemic treatment includes statins. The current treatment provides moderate improvement of lipids. Compliance problems include adherence to exercise and adherence to diet.  Risk factors for coronary artery disease include dyslipidemia, diabetes mellitus, family history, obesity, male sex, hypertension and a sedentary lifestyle.  Hypertension This  is a chronic problem. The current episode started more than 1 year ago. The problem has been resolved since onset. The problem is controlled. Pertinent negatives include no blurred vision, chest pain, headaches, neck pain, palpitations or shortness of breath. There are no associated agents to hypertension. Risk factors for coronary artery disease include dyslipidemia, diabetes mellitus, male gender, obesity, sedentary lifestyle, smoking/tobacco exposure and family history. Past treatments include beta blockers and diuretics. The current treatment provides mild improvement. Compliance problems include diet and exercise.  Hypertensive end-organ damage includes kidney disease and PVD. Identifiable causes of hypertension include chronic renal disease.    Review of systems  Constitutional: + steadily decreasing body weight,  current Body mass index is 35.98 kg/m. , no fatigue, no subjective hyperthermia, no subjective hypothermia Eyes: no blurry vision, no xerophthalmia ENT: no sore throat, no nodules palpated in throat, no dysphagia/odynophagia, no hoarseness Cardiovascular: no chest pain, no shortness of breath, no palpitations, + leg swelling with leaking- wears compression stockings Respiratory: no cough, no shortness of breath Gastrointestinal: no nausea/vomiting/diarrhea Musculoskeletal: no muscle/joint aches, walks with cane Skin: no rashes, no hyperemia Neurological: no tremors, no numbness, no tingling, no dizziness Psychiatric: no depression, no anxiety  Objective:    BP 128/82   Pulse (!) 58    Ht _0  (1.803 m)   Wt 258 lb (117 kg)   BMI 35.98 kg/m   Wt Readings from Last 3 Encounters:  02/09/22 258 lb (117 kg)  09/03/21 264 lb (119.7 kg)  08/11/21 262 lb 12.8 oz (119.2 kg)    BP Readings from Last 3 Encounters:  02/09/22 128/82  09/03/21 126/63  08/11/21 129/79    Physical Exam- Limited  Constitutional:  Body mass index is 35.98 kg/m. , not in acute distress, normal state of mind Eyes:  EOMI, no exophthalmos Neck: Supple Cardiovascular: RRR, no murmurs, rubs, or gallops, no edema Respiratory: Adequate breathing efforts, no crackles, rales, rhonchi, or wheezing Musculoskeletal: no gross deformities, strength intact in all four extremities, no gross restriction of joint movements, walks with cane Skin:  no rashes, no hyperemia Neurological: no tremor with outstretched hands    CMP ( most recent) CMP     Component Value Date/Time   NA 142 07/30/2021 0923   K 4.4 07/30/2021 0923   CL 103 07/30/2021 0923   CO2 25 07/30/2021 0923   GLUCOSE 74 07/30/2021 0923   GLUCOSE 78 05/02/2021 1420   BUN 18 07/30/2021 0923   CREATININE 1.57 (H) 07/30/2021 0923   CALCIUM 9.1 07/30/2021 0923   PROT 6.3 07/30/2021 0923   ALBUMIN 3.9 07/30/2021 0923   AST 23 07/30/2021 0923   ALT 24 07/30/2021 0923   ALKPHOS 71 07/30/2021 0923   BILITOT 0.4 07/30/2021 0923   GFRNONAA 54 (L) 05/02/2021 1420   GFRAA 73 07/02/2020 0909    Diabetic Labs (most recent): Lab Results  Component Value Date   HGBA1C 5.3 02/09/2022   HGBA1C 5.1 08/11/2021   HGBA1C 5.3 04/03/2021    Lab Results  Component Value Date   TSH 2.650 07/30/2021   TSH 2.760 07/02/2020   TSH 1.122 09/08/2017   FREET4 1.12 07/30/2021   FREET4 1.06 07/02/2020      Assessment & Plan:   1) Uncontrolled type 2 diabetes mellitus with hyperglycemia (HCC)  - [place] has currently uncontrolled symptomatic type 2 DM since  71 years of age.  He presents today with his meter and logs showing at goal glycemic  profile overall.  His POCT A1c today is 5.3%, essentially unchanged from previous visit.  He denies any significant hypoglycemia.  Analysis of his meter shows 7-day average of 114, 14-day average of 120, 30-day average of 120.  - Recent labs reviewed.  - I had a long discussion with him about the progressive nature of diabetes and the pathology behind its complications. -his diabetes is complicated by obesity/sedentary life, peripheral arterial disease with venous stasis and he remains at a high risk for more acute and chronic complications which include CAD, CVA, CKD, retinopathy, and neuropathy. These are all discussed in detail with him.  - Nutritional counseling repeated at each appointment due to patients tendency to fall back in to old habits.  - The patient admits there is a room for improvement in their diet and drink choices. -  Suggestion is made for the patient to avoid simple carbohydrates from their diet including Cakes, Sweet Desserts / Pastries, Ice Cream, Soda (diet and regular), Sweet Tea, Candies, Chips, Cookies, Sweet Pastries, Store Bought Juices, Alcohol in Excess of 1-2 drinks a day, Artificial Sweeteners, Coffee Creamer, and "Sugar-free" Products. This will help patient to have stable blood glucose profile and potentially avoid unintended weight gain.   - I encouraged the patient to switch to unprocessed or minimally processed complex starch and increased protein intake (animal or plant source), fruits, and vegetables.   - Patient is advised to stick to a routine mealtimes to eat 3 meals a day and avoid unnecessary snacks (to snack only to correct hypoglycemia).  - I have approached him with the following individualized plan to manage  his diabetes and patient agrees:   -Given his presentation with controlled glycemic profile, he will not need bolus insulin at this time.    -He is advised to continue his Tresiba 30 units SQ nightly and Metformin 500 mg po twice daily with  meals (renal function stable at this time).  -He is encouraged to continue monitoring blood glucose levels twice daily, before breakfast and before bed and report blood glucose levels less than 70 or greater than 200 for 3 tests in a row.  Offered him a CGM today, but he would like to wait.  - he is not a candidate for TZDs, SGLT2 inhibitors due to compromised peripheral circulation.    - he will be considered for incretin therapy as appropriate next visit.  - Specific targets for  A1c;  LDL, HDL,  and Triglycerides were discussed with the patient.  2) Blood Pressure /Hypertension:  His blood pressure is controlled to target.  He is advised to continue Lasix 20 mg po daily as needed for fluid, and Metoprolol 25 mg po twice daily.  He is advised to adopt a low sodium diet.  3) Lipids/Hyperlipidemia:  His most recent lipid panel from 07/30/21 shows controlled LDL of 65.  He is advised to continue Crestor 40 mg po daily at bedtime.  Side effects and precautions discussed with him.  He has appt with his PCP coming up.  I asked he have them send [place] copy of his labs for our records.  4)  Weight/Diet:  His Body mass index is 35.98 kg/m.  -   clearly complicating his diabetes care.   he is  a candidate for weight loss. I discussed with him the fact that loss of 5 - 10% of his  current body weight will have the most impact on his diabetes management.  Exercise, and detailed carbohydrates information provided  -  detailed  on discharge instructions.  5) Chronic Care/Health Maintenance: -he is on Statin medications and is encouraged to initiate and continue to follow up with Ophthalmology, Dentist,  Podiatrist at least yearly or according to recommendations, and advised to stay away from smoking. I have recommended yearly flu vaccine and pneumonia vaccine at least every 5 years; moderate intensity exercise for up to 150 minutes weekly; and  sleep for at least 7 hours a day.  - he is advised to maintain  close follow up with Redmond School, MD for primary care needs, as well as his other providers for optimal and coordinated care.     I spent 30 minutes in the care of the patient today including review of labs from [place], Lipids, Thyroid Function, Hematology (current and previous including abstractions from other facilities); face-to-face time discussing  his blood glucose readings/logs, discussing hypoglycemia and hyperglycemia episodes and symptoms, medications doses, his options of short and long term treatment based on the latest standards of care / guidelines;  discussion about incorporating lifestyle medicine;  and documenting the encounter.    Please refer to Patient Instructions for Blood Glucose Monitoring and Insulin/Medications Dosing Guide"  in media tab for additional information. Please  also refer to " Patient Self Inventory" in the Media  tab for reviewed elements of pertinent patient history.  [name] participated in the discussions, expressed understanding, and voiced agreement with the above plans.  All questions were answered to his satisfaction. he is encouraged to contact clinic should he have any questions or concerns prior to his return visit.   Follow up plan: - Return in about 6 months (around 08/11/2022) for Diabetes F/U with A1c in office, No previsit labs, Bring meter and logs.    [name], [facility] [address] [place], [place] [address] Phone: [phone] Fax: [phone]  02/09/2022, 9:54 AM

## 2022-02-19 DIAGNOSIS — D5919 Other autoimmune hemolytic anemia: Secondary | ICD-10-CM | POA: Diagnosis not present

## 2022-02-19 DIAGNOSIS — E559 Vitamin D deficiency, unspecified: Secondary | ICD-10-CM | POA: Diagnosis not present

## 2022-02-19 DIAGNOSIS — I4891 Unspecified atrial fibrillation: Secondary | ICD-10-CM | POA: Diagnosis not present

## 2022-02-19 DIAGNOSIS — I4892 Unspecified atrial flutter: Secondary | ICD-10-CM | POA: Diagnosis not present

## 2022-02-19 DIAGNOSIS — D649 Anemia, unspecified: Secondary | ICD-10-CM | POA: Diagnosis not present

## 2022-02-19 DIAGNOSIS — Z0001 Encounter for general adult medical examination with abnormal findings: Secondary | ICD-10-CM | POA: Diagnosis not present

## 2022-02-19 DIAGNOSIS — C8580 Other specified types of non-Hodgkin lymphoma, unspecified site: Secondary | ICD-10-CM | POA: Diagnosis not present

## 2022-02-19 DIAGNOSIS — D518 Other vitamin B12 deficiency anemias: Secondary | ICD-10-CM | POA: Diagnosis not present

## 2022-02-19 DIAGNOSIS — I700 Atherosclerosis of aorta: Secondary | ICD-10-CM | POA: Diagnosis not present

## 2022-02-19 DIAGNOSIS — E063 Autoimmune thyroiditis: Secondary | ICD-10-CM | POA: Diagnosis not present

## 2022-02-19 DIAGNOSIS — E1140 Type 2 diabetes mellitus with diabetic neuropathy, unspecified: Secondary | ICD-10-CM | POA: Diagnosis not present

## 2022-03-26 ENCOUNTER — Other Ambulatory Visit (HOSPITAL_COMMUNITY): Payer: Self-pay | Admitting: Physician Assistant

## 2022-03-30 ENCOUNTER — Other Ambulatory Visit: Payer: Self-pay

## 2022-03-30 ENCOUNTER — Encounter (HOSPITAL_COMMUNITY): Payer: Self-pay | Admitting: Hematology

## 2022-04-01 DIAGNOSIS — H26492 Other secondary cataract, left eye: Secondary | ICD-10-CM | POA: Diagnosis not present

## 2022-04-01 DIAGNOSIS — Z961 Presence of intraocular lens: Secondary | ICD-10-CM | POA: Diagnosis not present

## 2022-04-15 DIAGNOSIS — H26491 Other secondary cataract, right eye: Secondary | ICD-10-CM | POA: Diagnosis not present

## 2022-04-23 DIAGNOSIS — E119 Type 2 diabetes mellitus without complications: Secondary | ICD-10-CM | POA: Diagnosis not present

## 2022-04-23 DIAGNOSIS — I10 Essential (primary) hypertension: Secondary | ICD-10-CM | POA: Diagnosis not present

## 2022-05-01 ENCOUNTER — Ambulatory Visit: Payer: Medicare Other | Admitting: Urology

## 2022-05-14 ENCOUNTER — Encounter: Payer: Self-pay | Admitting: Urology

## 2022-05-14 ENCOUNTER — Ambulatory Visit (INDEPENDENT_AMBULATORY_CARE_PROVIDER_SITE_OTHER): Payer: Medicare Other | Admitting: Urology

## 2022-05-14 VITALS — BP 112/63 | HR 76 | Ht 71.0 in | Wt 251.6 lb

## 2022-05-14 DIAGNOSIS — R972 Elevated prostate specific antigen [PSA]: Secondary | ICD-10-CM

## 2022-05-14 NOTE — Progress Notes (Signed)
Assessment: 1. Elevated PSA     Plan: I reviewed his records from Dr. Gerarda Fraction including office notes and lab results. Request all prior PSA results from Dr. Gerarda Fraction. Today I had a long discussion with the patient regarding PSA and the rationale and controversies of prostate cancer early detection.  I discussed the pros and cons of further evaluation including TRUS and prostate Bx.  Potential adverse events and complications as well as standard instructions were given.  Patient expressed his understanding of these issues. IsoPSA today Will call with results and recommendations  Chief Complaint:  Chief Complaint  Patient presents with   Elevated PSA    History of Present Illness:  [name] is a 71 y.o. male who is seen in consultation from [employer], MD for evaluation of elevated PSA. PSA from 02/23/2022 was 4.55.  No prior PSA results available for review. He is not aware of any prior elevated PSA results.  No history of UTIs or prostatitis.  He has a family history of prostate cancer with his father. He is not having any significant lower urinary tract symptoms other than nocturia.  He associates this with his use of furosemide.  No dysuria or gross hematuria. IPSS = 2 today.  He is on Eliquis.  Past Medical History:  Past Medical History:  Diagnosis Date   Asthma    as child   Diabetes mellitus without complication ([place])    DLBCL (diffuse large B cell lymphoma) ([place]) 02/28/2009   Edema    Heart failure, diastolic, chronic ([place])    patient denies   Hyperlipidemia, mixed    Hypertension    IDA (iron deficiency anemia)    Large cell lymphoma ([place]) 01/2005   autologous stem cell transplant 12/2005   Obesity, morbid (more than 100 lbs over ideal weight or BMI > 40) ([place])    Venous insufficiency 04/29/2011    Past Surgical History:  Past Surgical History:  Procedure Laterality Date   BACK SURGERY  2006   T6 vertebrae removed/titanium placed   CATARACT EXTRACTION  W/PHACO Right 03/31/2019   Procedure: CATARACT EXTRACTION PHACO AND INTRAOCULAR LENS PLACEMENT ([place]);  Surgeon: Baruch Goldmann, MD;  Location: [facility];  Service: Ophthalmology;  Laterality: Right;  CDE: 3.21   CATARACT EXTRACTION W/PHACO Left 04/14/2019   Procedure: CATARACT EXTRACTION PHACO AND INTRAOCULAR LENS PLACEMENT ([facility]);  Surgeon: Baruch Goldmann, MD;  Location: [facility];  Service: Ophthalmology;  Laterality: Left;  left - pt knows to arrive at 7:45, CDE: 3.55   COLONOSCOPY  11/24/2004   Polyps in the left colon ablated/removed as described above.  Two submucosal lesions consistent with lipomas as described above not  manipulated./ Normal rectum   COLONOSCOPY  06/20/2012   MULTIPLE RECTAL AND COLONIC POLYPS   COLONOSCOPY N/A 03/08/2015   RMR: Capacious, redundant colon. Multiple colonic and rectosigmoid polyps removed. ablated as described above. colonic lipoma abnormal appearing terminal ileum likely a variant of normal). Howeverwith history  biopsies obtained.    ESOPHAGOGASTRODUODENOSCOPY N/A 03/08/2015   RMR: Hiatal hernia Polypoid gastric mucosa with multiple gastric polyps. largest polyp removed via snare polypectomgy hemostasis clip placed at base. Status post gastric biopsy. Status post video capsule placement.    GIVENS CAPSULE STUDY N/A 03/08/2015   Procedure: GIVENS CAPSULE STUDY;  Surgeon: Daneil Dolin, MD;  [facility];  Service: Endoscopy;  Laterality: N/A;   LIMBAL STEM CELL TRANSPLANT     LUNG BIOPSY  6/06   MULTIPLE TOOTH EXTRACTIONS  04/2005   port a cath placement     PORT-A-CATH REMOVAL  09/08/2012   Procedure: REMOVAL PORT-A-CATH;  Surgeon: Melrose Nakayama, MD;  Location: [facility];  Service: Thoracic;  Laterality: N/A;    Allergies:  Allergies  Allergen Reactions   Penicillins Rash    Has patient had a PCN reaction causing immediate rash, facial/tongue/throat swelling, SOB or lightheadedness with hypotension: Unknown Has patient had a PCN reaction causing  severe rash involving mucus membranes or skin necrosis: Unknown Has patient had a PCN reaction that required hospitalization: Unknown Has patient had a PCN reaction occurring within the last 10 years: No If all of the above answers are "NO", then may proceed with Cephalosporin use.     Family History:  Family History  Problem Relation Age of Onset   Cancer Mother        lung   Hypertension Mother    Hyperlipidemia Mother    Cancer Father        prostate   Hypertension Father    Colon cancer Neg Hx    Diabetes Neg Hx     Social History:  Social History   Tobacco Use   Smoking status: Former    Packs/day: 0.50    Years: 15.00    Total pack years: 7.50    Types: Cigarettes    Quit date: 11/11/2002    Years since quitting: 19.5   Smokeless tobacco: Never  Vaping Use   Vaping Use: Never used  Substance Use Topics   Alcohol use: No    Alcohol/week: 0.0 standard drinks of alcohol   Drug use: No    Review of symptoms:  Constitutional:  Negative for unexplained weight loss, night sweats, fever, chills ENT:  Negative for nose bleeds, sinus pain, painful swallowing CV:  Negative for chest pain, shortness of breath, exercise intolerance, palpitations, loss of consciousness Resp:  Negative for cough, wheezing, shortness of breath GI:  Negative for nausea, vomiting, diarrhea, bloody stools GU:  Positives noted in HPI; otherwise negative for gross hematuria, dysuria, urinary incontinence Neuro:  Negative for seizures, poor balance, limb weakness, slurred speech Psych:  Negative for lack of energy, depression, anxiety Endocrine:  Negative for polydipsia, polyuria, symptoms of hypoglycemia (dizziness, hunger, sweating) Hematologic:  Negative for anemia, purpura, petechia, prolonged or excessive bleeding, use of anticoagulants  Allergic:  Negative for difficulty breathing or choking as a result of exposure to anything; no shellfish allergy; no allergic response (rash/itch) to  materials, foods  Physical exam: BP 112/63   Pulse 76   Ht 5\' 11"  (1.803 m)   Wt 258 lb (117 kg)   BMI 35.98 kg/m  GENERAL APPEARANCE:  Well appearing, well developed, well nourished, NAD HEENT: Atraumatic, Normocephalic, oropharynx clear. NECK: Supple without lymphadenopathy or thyromegaly. LUNGS: Clear to auscultation bilaterally. HEART: Regular Rate and Rhythm without murmurs, gallops, or rubs. ABDOMEN: Soft, non-tender, No Masses. EXTREMITIES: Moves all extremities well.  Without clubbing, cyanosis, or edema. NEUROLOGIC:  Alert and oriented x 3, normal gait, CN II-XII grossly intact.  MENTAL STATUS:  Appropriate. BACK:  Non-tender to palpation.  No CVAT SKIN:  Warm, dry and intact. GU: Penis:  uncircumcised Meatus: Normal Scrotum: normal, no masses Testis: normal without masses bilateral Epididymis: normal Prostate: difficult to feel base, NT Rectum: Normal tone,  no masses or tenderness     Results: U/A:0-5 WBC, 0-2 RBC, few bacteria

## 2022-05-15 LAB — URINALYSIS, ROUTINE W REFLEX MICROSCOPIC
Bilirubin, UA: NEGATIVE
Glucose, UA: NEGATIVE
Ketones, UA: NEGATIVE
Leukocytes,UA: NEGATIVE
Nitrite, UA: NEGATIVE
Specific Gravity, UA: 1.025 (ref 1.005–1.030)
Urobilinogen, Ur: 1 mg/dL (ref 0.2–1.0)
pH, UA: 5.5 (ref 5.0–7.5)

## 2022-05-15 LAB — MICROSCOPIC EXAMINATION

## 2022-05-19 ENCOUNTER — Encounter: Payer: Self-pay | Admitting: Urology

## 2022-05-25 ENCOUNTER — Telehealth: Payer: Self-pay

## 2022-05-25 NOTE — Telephone Encounter (Signed)
Clearance form generated and faxed to PCP requesting clearance to hold eliquis prior to prostate biopsy.  Waiting for return fax.

## 2022-05-25 NOTE — Telephone Encounter (Signed)
-----   Message from Primus Bravo, MD sent at 05/21/2022  3:15 PM EDT ----- Regarding: Eliquis Please call Dr. Nolon Rod office to find out if Mr. [name] can hold his Eliquis prior to a prostate biopsy.  He would need to hold this for 2 full days prior to his biopsy.

## 2022-05-26 ENCOUNTER — Telehealth: Payer: Self-pay

## 2022-05-26 DIAGNOSIS — R972 Elevated prostate specific antigen [PSA]: Secondary | ICD-10-CM

## 2022-05-26 NOTE — Telephone Encounter (Signed)
Patient approved to hold eliquis prior to procedure, clearance form scanned in chart.  Patient scheduled for prostate biopsy on 10/12.  Informed patient of biopsy instructions over the phone also mailed instructions along with apt reminder.

## 2022-05-26 NOTE — Telephone Encounter (Signed)
-----   Message from Primus Bravo, MD sent at 05/21/2022  3:15 PM EDT ----- Regarding: Eliquis Please call Dr. Nolon Rod office to find out if Mr. Ankney can hold his Eliquis prior to a prostate biopsy.  He would need to hold this for 2 full days prior to his biopsy.

## 2022-06-01 ENCOUNTER — Telehealth: Payer: Self-pay

## 2022-06-01 NOTE — Telephone Encounter (Signed)
Patient would like to speak with a nurse in regards to questions about stopping his blood thinner. Please give him a call at [phone].

## 2022-06-02 NOTE — Telephone Encounter (Signed)
FYI Patient requested his biopsy apt be rescheduled.  He did not hold his Eliquis today.  I rescheduled his apt for 06/09/2022 and mailed a reminder letter.

## 2022-06-03 ENCOUNTER — Telehealth: Payer: Self-pay

## 2022-06-03 NOTE — Telephone Encounter (Signed)
Please call patient

## 2022-06-03 NOTE — Telephone Encounter (Signed)
Patient called in wanting to rescheduled prostate biopsy due to confusion regarding medication and the patient states he has a cold. Please call patient and advised on medication and explanation of instruction for prostate biopsy. Patient voiced that the prostate biopsy information was mailed to him but he was confused on the direction.

## 2022-06-03 NOTE — Telephone Encounter (Signed)
Spoke to patient he did not stop his blood thinner 2 days prior to biopsy.  His apt was rescheduled for 10/17 and I also went over biopsy instruction again with patient.  Patient voiced understanding.

## 2022-06-04 ENCOUNTER — Ambulatory Visit (HOSPITAL_COMMUNITY): Payer: Medicare Other

## 2022-06-04 ENCOUNTER — Other Ambulatory Visit: Payer: Medicare Other | Admitting: Urology

## 2022-06-05 ENCOUNTER — Telehealth: Payer: Self-pay

## 2022-06-05 NOTE — Telephone Encounter (Signed)
Called and spoke with patient about time change for prostate biopsy due to MD clinic schedule for surgery.  New arrival time of 9:30 given to patient for 06/09/2022 for 9:45 biopsy.  Patient voiced understanding. Scheduling notified of change as well.

## 2022-06-09 ENCOUNTER — Ambulatory Visit (HOSPITAL_BASED_OUTPATIENT_CLINIC_OR_DEPARTMENT_OTHER): Payer: Medicare Other | Admitting: Urology

## 2022-06-09 ENCOUNTER — Ambulatory Visit (HOSPITAL_COMMUNITY)
Admission: RE | Admit: 2022-06-09 | Discharge: 2022-06-09 | Disposition: A | Discharge status: Home / Self Care | Payer: Medicare Other | Source: Ambulatory Visit | Attending: Urology | Admitting: Urology

## 2022-06-09 ENCOUNTER — Encounter: Payer: Self-pay | Admitting: Urology

## 2022-06-09 ENCOUNTER — Encounter (HOSPITAL_COMMUNITY): Payer: Self-pay

## 2022-06-09 ENCOUNTER — Other Ambulatory Visit: Payer: Medicare Other | Admitting: Urology

## 2022-06-09 ENCOUNTER — Other Ambulatory Visit: Payer: Self-pay

## 2022-06-09 DIAGNOSIS — C61 Malignant neoplasm of prostate: Secondary | ICD-10-CM

## 2022-06-09 DIAGNOSIS — R972 Elevated prostate specific antigen [PSA]: Secondary | ICD-10-CM | POA: Diagnosis not present

## 2022-06-09 MED ORDER — LIDOCAINE HCL (PF) 1 % IJ SOLN
INTRAMUSCULAR | Status: AC
  Administered 2022-06-09: 2.1 mL
  Filled 2022-06-09: qty 5

## 2022-06-09 MED ORDER — CEFTRIAXONE SODIUM 1 G IJ SOLR: 1.0000 g | Freq: Once | INTRAMUSCULAR | Status: AC

## 2022-06-09 MED ORDER — LIDOCAINE HCL (PF) 2 % IJ SOLN
INTRAMUSCULAR | Status: AC
  Administered 2022-06-09: 10 mL
  Filled 2022-06-09: qty 10

## 2022-06-09 MED ORDER — LIDOCAINE HCL (PF) 2 % IJ SOLN: 10.0000 mL | Freq: Once | INTRAMUSCULAR | Status: AC

## 2022-06-09 MED ORDER — CEFTRIAXONE SODIUM 1 G IJ SOLR
INTRAMUSCULAR | Status: AC
  Administered 2022-06-09: 1 g via INTRAMUSCULAR
  Filled 2022-06-09: qty 10

## 2022-06-09 MED ORDER — LIDOCAINE HCL (PF) 1 % IJ SOLN: 2.1000 mL | Freq: Once | INTRAMUSCULAR | Status: AC

## 2022-06-09 NOTE — Progress Notes (Signed)
PT tolerated prostate biopsy procedure and antibiotic injection well today. Labs obtained and sent for pathology. PT ambulatory at discharge with no acute distress noted and verbalized understanding of discharge instructions.

## 2022-06-09 NOTE — Progress Notes (Signed)
Assessment: 1. Elevated PSA     Plan: Post biopsy instructions given Return to office in 7-10 days to discuss biopsy results  Chief Complaint:  Chief Complaint  Patient presents with   Prostate Biopsy    History of Present Illness:  [name] is a 71 y.o. male who is seen for further evaluation of elevated PSA. PSA from 02/23/2022 was 4.55.  No prior PSA results available for review. He is not aware of any prior elevated PSA results.  No history of UTIs or prostatitis.  He has a family history of prostate cancer with his father. IsoPSA from 9/23:  5.3 with isoPSA index of 15.4 indicating increased risk of aggressive prostate cancer.   No significant lower urinary tract symptoms other than nocturia.  He associates this with his use of furosemide.  No dysuria or gross hematuria. IPSS = 2.  He is on Eliquis.  He presents today for prostate ultrasound and biopsy. He has held his Eliquis in preparation for procedure.  Portions of the above documentation were copied from a prior visit for review purposes only.   Past Medical History:  Past Medical History:  Diagnosis Date   Asthma    as child   Diabetes mellitus without complication ([place])    DLBCL (diffuse large B cell lymphoma) ([place]) 02/28/2009   Edema    Heart failure, diastolic, chronic ([place])    patient denies   Hyperlipidemia, mixed    Hypertension    IDA (iron deficiency anemia)    Large cell lymphoma ([place]) 01/2005   autologous stem cell transplant 12/2005   Obesity, morbid (more than 100 lbs over ideal weight or BMI > 40) ([place])    Venous insufficiency 04/29/2011    Past Surgical History:  Past Surgical History:  Procedure Laterality Date   BACK SURGERY  2006   T6 vertebrae removed/titanium placed   CATARACT EXTRACTION W/PHACO Right 03/31/2019   Procedure: CATARACT EXTRACTION PHACO AND INTRAOCULAR LENS PLACEMENT ([place]);  Surgeon: Baruch Goldmann, MD;  Location: [facility];  Service: Ophthalmology;  Laterality: Right;   CDE: 3.21   CATARACT EXTRACTION W/PHACO Left 04/14/2019   Procedure: CATARACT EXTRACTION PHACO AND INTRAOCULAR LENS PLACEMENT ([facility]);  Surgeon: Baruch Goldmann, MD;  Location: [facility];  Service: Ophthalmology;  Laterality: Left;  left - pt knows to arrive at 7:45, CDE: 3.55   COLONOSCOPY  11/24/2004   Polyps in the left colon ablated/removed as described above.  Two submucosal lesions consistent with lipomas as described above not  manipulated./ Normal rectum   COLONOSCOPY  06/20/2012   MULTIPLE RECTAL AND COLONIC POLYPS   COLONOSCOPY N/A 03/08/2015   RMR: Capacious, redundant colon. Multiple colonic and rectosigmoid polyps removed. ablated as described above. colonic lipoma abnormal appearing terminal ileum likely a variant of normal). Howeverwith history  biopsies obtained.    ESOPHAGOGASTRODUODENOSCOPY N/A 03/08/2015   RMR: Hiatal hernia Polypoid gastric mucosa with multiple gastric polyps. largest polyp removed via snare polypectomgy hemostasis clip placed at base. Status post gastric biopsy. Status post video capsule placement.    GIVENS CAPSULE STUDY N/A 03/08/2015   Procedure: GIVENS CAPSULE STUDY;  Surgeon: Daneil Dolin, MD;  [facility];  Service: Endoscopy;  Laterality: N/A;   LIMBAL STEM CELL TRANSPLANT     LUNG BIOPSY  6/06   MULTIPLE TOOTH EXTRACTIONS  04/2005   port a cath placement     PORT-A-CATH REMOVAL  09/08/2012   Procedure: REMOVAL PORT-A-CATH;  Surgeon: Melrose Nakayama, MD;  [facility];  Service: Thoracic;  Laterality: N/A;    Allergies:  Allergies  Allergen Reactions   Penicillins Rash    Has patient had a PCN reaction causing immediate rash, facial/tongue/throat swelling, SOB or lightheadedness with hypotension: Unknown Has patient had a PCN reaction causing severe rash involving mucus membranes or skin necrosis: Unknown Has patient had a PCN reaction that required hospitalization: Unknown Has patient had a PCN reaction occurring within the last 10  years: No If all of the above answers are "NO", then may proceed with Cephalosporin use.     Family History:  Family History  Problem Relation Age of Onset   Cancer Mother        lung   Hypertension Mother    Hyperlipidemia Mother    Cancer Father        prostate   Hypertension Father    Colon cancer Neg Hx    Diabetes Neg Hx     Social History:  Social History   Tobacco Use   Smoking status: Former    Packs/day: 0.50    Years: 15.00    Total pack years: 7.50    Types: Cigarettes    Quit date: 11/11/2002    Years since quitting: 19.5   Smokeless tobacco: Never  Vaping Use   Vaping Use: Never used  Substance Use Topics   Alcohol use: No    Alcohol/week: 0.0 standard drinks of alcohol   Drug use: No    ROS: Constitutional:  Negative for fever, chills, weight loss CV: Negative for chest pain, previous MI, hypertension Respiratory:  Negative for shortness of breath, wheezing, sleep apnea, frequent cough GI:  Negative for nausea, vomiting, bloody stool, GERD  Physical exam: GENERAL APPEARANCE:  Well appearing, well developed, well nourished, NAD HEENT:  Atraumatic, normocephalic, oropharynx clear NECK:  Supple without lymphadenopathy or thyromegaly ABDOMEN:  Soft, non-tender, no masses EXTREMITIES:  Moves all extremities well, without clubbing, cyanosis, or edema NEUROLOGIC:  Alert and oriented x 3, normal gait, CN II-XII grossly intact MENTAL STATUS:  appropriate BACK:  Non-tender to palpation, No CVAT SKIN:  Warm, dry, and intact     Results: None  TRANSRECTAL ULTRASOUND AND PROSTATE BIOPSY  Indication:  Elevated PSA  Prophylactic antibiotic administration: Rocephin  All medications that could result in increased bleeding were discontinued within an appropriate period of the time of biopsy.  Risk including bleeding and infection were discussed.  Informed consent was obtained.  The patient was placed in the left lateral decubitus position.  PROCEDURE  1.  TRANSRECTAL ULTRASOUND OF THE PROSTATE  The 7 MHz transrectal probe was used to image the prostate.  Anal stenosis was not noted.  TRUS volume: 20.55 ml  Hypoechoic areas: None  Hyperechoic areas: None  Central calcifications: present  Margins:  normal   PROCEDURE 2:  PROSTATE BIOPSY  A periprostatic block was performed using 1% lidocaine and transrectal ultrasound guidance. Under transrectal ultrasound guidance, and using the Biopty gun, prostate biopsies were obtained systematically from the apex, mid gland, and base bilaterally.  A total of 12 cores were obtained.  Hemostasis was obtained with gentle pressure on the prostate.  The procedures were well-tolerated.  No significant bleeding was noted at the end of the procedure.  The patient was stable for discharge from the office.

## 2022-06-11 ENCOUNTER — Telehealth: Payer: Self-pay

## 2022-06-11 NOTE — Telephone Encounter (Signed)
Open in error

## 2022-06-22 ENCOUNTER — Encounter: Payer: Self-pay | Admitting: Urology

## 2022-06-22 ENCOUNTER — Ambulatory Visit (INDEPENDENT_AMBULATORY_CARE_PROVIDER_SITE_OTHER): Payer: Medicare Other | Admitting: Urology

## 2022-06-22 VITALS — BP 136/68 | HR 61

## 2022-06-22 DIAGNOSIS — C61 Malignant neoplasm of prostate: Secondary | ICD-10-CM | POA: Insufficient documentation

## 2022-06-22 NOTE — Progress Notes (Signed)
Assessment: 1. Prostate cancer [facility]); PSA 5.3; Gleason grade group 1, 2, 3; unfavorable intermediate risk; T1cNx Mx     Plan: I spent a total of 40 minutes counseling [name] regarding the diagnosis of localized prostate cancer.  I spent the first 15 minutes discussing the biopsy results.  Using the prostate cancer nomogram, I sided a probability of 32 percent for localized prostate cancer, 65 percent of extracapsular extension, 18 percent of seminal vesicle involvement, and 16 percent of lymph node involvement.  I discussed the diagnosis of localized prostate cancer in the natural history of prostate cancer. I then spent the next 25 minutes discussing treatment options for localized prostate cancer.  Specifically, I discussed active surveillance, radical prostatectomy (RALP, RRP, RPP), external beam radiation, low dose rate brachytherapy, cryosurgery, and high intensity frequency ultrasound.  I discussed the risk and benefits of each treatment.  I discussed the potential risk of impotence and incontinence as they relate to treatment of prostate cancer.  I did not recommend active surveillance given his unfavorable intermediate risk disease.  I also advised him that low-dose brachytherapy may not be his best option as a single modality.  Questions were answered.  The patient was given literature regarding prostate cancer to review.  He is primarily interested in radiation therapy. Return to office in 4 weeks   Chief Complaint:  Chief Complaint  Patient presents with   Prostate Cancer    History of Present Illness:  [name] is a 71 y.o. male who is seen for discussion of biopsy results and recently diagnosed prostate cancer. He was recently evaluated for an elevated PSA. PSA from 02/23/2022 was 4.55.  No prior PSA results available for review. He is not aware of any prior elevated PSA results.  No history of UTIs or prostatitis.  He has a family history of prostate cancer with his  father. IsoPSA from 9/23:  5.3 with isoPSA index of 15.4 indicating increased risk of aggressive prostate cancer.  No significant lower urinary tract symptoms other than nocturia.  He associates this with his use of furosemide.  No dysuria or gross hematuria. IPSS = 2.  He is on Eliquis.  He returns today following his transrectal ultrasound and biopsy of the prostate on 06/09/2022. TRUS volume: 20.55 ml  PSA density: 0.25 Biopsy results:             Gleason score: 4+ 3 = 7, 3 + 4 = 7, 3 + 3 = 6            # positive cores: 3/6 on right    5/6 on left            Location of cancer: apex, mid gland, base  Complications after biopsy: none Patient without significant LUTS.    Patient without erectile dysfunction.    Portions of the above documentation were copied from a prior visit for review purposes only.   Past Medical History:  Past Medical History:  Diagnosis Date   Asthma    as child   Diabetes mellitus without complication ([place])    DLBCL (diffuse large B cell lymphoma) ([place]) 02/28/2009   Edema    Heart failure, diastolic, chronic ([place])    patient denies   Hyperlipidemia, mixed    Hypertension    IDA (iron deficiency anemia)    Large cell lymphoma ([place]) 01/2005   autologous stem cell transplant 12/2005   Obesity, morbid (more than 100 lbs over ideal weight or BMI >  40) ([place])    Venous insufficiency 04/29/2011    Past Surgical History:  Past Surgical History:  Procedure Laterality Date   BACK SURGERY  2006   T6 vertebrae removed/titanium placed   CATARACT EXTRACTION W/PHACO Right 03/31/2019   Procedure: CATARACT EXTRACTION PHACO AND INTRAOCULAR LENS PLACEMENT ([place]);  Surgeon: Baruch Goldmann, MD;  Location: [facility];  Service: Ophthalmology;  Laterality: Right;  CDE: 3.21   CATARACT EXTRACTION W/PHACO Left 04/14/2019   Procedure: CATARACT EXTRACTION PHACO AND INTRAOCULAR LENS PLACEMENT ([facility]);  Surgeon: Baruch Goldmann, MD;  Location: [facility];  Service: Ophthalmology;   Laterality: Left;  left - pt knows to arrive at 7:45, CDE: 3.55   COLONOSCOPY  11/24/2004   Polyps in the left colon ablated/removed as described above.  Two submucosal lesions consistent with lipomas as described above not  manipulated./ Normal rectum   COLONOSCOPY  06/20/2012   MULTIPLE RECTAL AND COLONIC POLYPS   COLONOSCOPY N/A 03/08/2015   RMR: Capacious, redundant colon. Multiple colonic and rectosigmoid polyps removed. ablated as described above. colonic lipoma abnormal appearing terminal ileum likely a variant of normal). Howeverwith history  biopsies obtained.    ESOPHAGOGASTRODUODENOSCOPY N/A 03/08/2015   RMR: Hiatal hernia Polypoid gastric mucosa with multiple gastric polyps. largest polyp removed via snare polypectomgy hemostasis clip placed at base. Status post gastric biopsy. Status post video capsule placement.    GIVENS CAPSULE STUDY N/A 03/08/2015   Procedure: GIVENS CAPSULE STUDY;  Surgeon: Daneil Dolin, MD;  [facility];  Service: Endoscopy;  Laterality: N/A;   LIMBAL STEM CELL TRANSPLANT     LUNG BIOPSY  6/06   MULTIPLE TOOTH EXTRACTIONS  04/2005   port a cath placement     PORT-A-CATH REMOVAL  09/08/2012   Procedure: REMOVAL PORT-A-CATH;  Surgeon: Melrose Nakayama, MD;  Location: [facility];  Service: Thoracic;  Laterality: N/A;    Allergies:  Allergies  Allergen Reactions   Penicillins Rash    Has patient had a PCN reaction causing immediate rash, facial/tongue/throat swelling, SOB or lightheadedness with hypotension: Unknown Has patient had a PCN reaction causing severe rash involving mucus membranes or skin necrosis: Unknown Has patient had a PCN reaction that required hospitalization: Unknown Has patient had a PCN reaction occurring within the last 10 years: No If all of the above answers are "NO", then may proceed with Cephalosporin use.     Family History:  Family History  Problem Relation Age of Onset   Cancer Mother        lung   Hypertension  Mother    Hyperlipidemia Mother    Cancer Father        prostate   Hypertension Father    Colon cancer Neg Hx    Diabetes Neg Hx     Social History:  Social History   Tobacco Use   Smoking status: Former    Packs/day: 0.50    Years: 15.00    Total pack years: 7.50    Types: Cigarettes    Quit date: 11/11/2002    Years since quitting: 19.6   Smokeless tobacco: Never  Vaping Use   Vaping Use: Never used  Substance Use Topics   Alcohol use: No    Alcohol/week: 0.0 standard drinks of alcohol   Drug use: No    ROS: Constitutional:  Negative for fever, chills, weight loss CV: Negative for chest pain, previous MI, hypertension Respiratory:  Negative for shortness of breath, wheezing, sleep apnea, frequent cough GI:  Negative for nausea,  vomiting, bloody stool, GERD  Physical exam: BP 136/68   Pulse 61  GENERAL APPEARANCE:  Well appearing, well developed, well nourished, NAD HEENT:  Atraumatic, normocephalic, oropharynx clear NECK:  Supple without lymphadenopathy or thyromegaly ABDOMEN:  Soft, non-tender, no masses EXTREMITIES:  Moves all extremities well, without clubbing, cyanosis, or edema NEUROLOGIC:  Alert and oriented x 3, normal gait, CN II-XII grossly intact MENTAL STATUS:  appropriate BACK:  Non-tender to palpation, No CVAT SKIN:  Warm, dry, and intact     Results: None

## 2022-06-23 DIAGNOSIS — I10 Essential (primary) hypertension: Secondary | ICD-10-CM | POA: Diagnosis not present

## 2022-06-23 DIAGNOSIS — E1140 Type 2 diabetes mellitus with diabetic neuropathy, unspecified: Secondary | ICD-10-CM | POA: Diagnosis not present

## 2022-07-06 DIAGNOSIS — J069 Acute upper respiratory infection, unspecified: Secondary | ICD-10-CM | POA: Diagnosis not present

## 2022-07-06 DIAGNOSIS — R059 Cough, unspecified: Secondary | ICD-10-CM | POA: Diagnosis not present

## 2022-07-07 ENCOUNTER — Other Ambulatory Visit (HOSPITAL_COMMUNITY): Payer: Self-pay | Admitting: Family Medicine

## 2022-07-07 ENCOUNTER — Ambulatory Visit (HOSPITAL_COMMUNITY)
Admission: RE | Admit: 2022-07-07 | Discharge: 2022-07-07 | Disposition: A | Discharge status: Home / Self Care | Payer: Medicare Other | Source: Ambulatory Visit | Attending: Family Medicine | Admitting: Family Medicine

## 2022-07-07 DIAGNOSIS — J069 Acute upper respiratory infection, unspecified: Secondary | ICD-10-CM | POA: Insufficient documentation

## 2022-07-07 DIAGNOSIS — J9811 Atelectasis: Secondary | ICD-10-CM | POA: Diagnosis not present

## 2022-07-07 DIAGNOSIS — R059 Cough, unspecified: Secondary | ICD-10-CM | POA: Insufficient documentation

## 2022-07-20 ENCOUNTER — Ambulatory Visit: Payer: Medicare Other | Admitting: Urology

## 2022-07-20 ENCOUNTER — Encounter: Payer: Self-pay | Admitting: Urology

## 2022-07-20 VITALS — BP 147/94 | HR 73 | Ht 71.0 in | Wt 256.1 lb

## 2022-07-20 DIAGNOSIS — C61 Malignant neoplasm of prostate: Secondary | ICD-10-CM

## 2022-07-20 NOTE — Progress Notes (Signed)
Assessment: 1. Prostate cancer ([place]); PSA 5.3; Gleason grade group 1, 2, 3; unfavorable intermediate risk; T1cNx Mx     Plan: I again discussed the biopsy results as well as treatment options for unfavorable intermediate risk prostate cancer. He is primarily interested in radiation therapy. I discussed general aspects of radiation therapy for prostate cancer.  I also discussed placement of fiducial markers and SpaceOAR prior to initiation of radiation therapy. Consult Dr. Tammi Klippel with radiation oncology. We will arrange follow-up pending results of above.   Today, I personally spent 30 minutes in direct face to face time with the patient, of which greater than 50% of the time was spent in patient education and counseling as described above.   Chief Complaint:  Chief Complaint  Patient presents with   Prostate Cancer    History of Present Illness:  [name] is a 71 y.o. male who is seen for discussion of biopsy results and recently diagnosed prostate cancer. He was recently evaluated for an elevated PSA. PSA from 02/23/2022 was 4.55.  No prior PSA results available for review. He is not aware of any prior elevated PSA results.  No history of UTIs or prostatitis.  He has a family history of prostate cancer with his father. IsoPSA from 9/23:  5.3 with isoPSA index of 15.4 indicating increased risk of aggressive prostate cancer.  No significant lower urinary tract symptoms other than nocturia.  He associates this with his use of furosemide.  No dysuria or gross hematuria. IPSS = 2.  He is on Eliquis.  He underwent transrectal ultrasound and biopsy of the prostate on 06/09/2022. TRUS volume: 20.55 ml  PSA density: 0.25 Biopsy results:             Gleason score: 4+ 3 = 7, 3 + 4 = 7, 3 + 3 = 6            # positive cores: 3/6 on right    5/6 on left            Location of cancer: apex, mid gland, base  Complications after biopsy: none  He returns today for further  discussion of treatment options for his unfavorable intermediate risk prostate cancer.  No new urinary symptoms.  No dysuria or gross hematuria.  Portions of the above documentation were copied from a prior visit for review purposes only.   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   autologous stem cell transplant 12/2005   Obesity, morbid (more than 100 lbs over ideal weight or BMI > 40) ([place])    Venous insufficiency 04/29/2011    Past Surgical History:  Past Surgical History:  Procedure Laterality Date   BACK SURGERY  2006   T6 vertebrae removed/titanium placed   CATARACT EXTRACTION W/PHACO Right 03/31/2019   Procedure: CATARACT EXTRACTION PHACO AND INTRAOCULAR LENS PLACEMENT ([place]);  Surgeon: Baruch Goldmann, MD;  Location: [facility];  Service: Ophthalmology;  Laterality: Right;  CDE: 3.21   CATARACT EXTRACTION W/PHACO Left 04/14/2019   Procedure: CATARACT EXTRACTION PHACO AND INTRAOCULAR LENS PLACEMENT ([facility]);  Surgeon: Baruch Goldmann, MD;  Location: [facility];  Service: Ophthalmology;  Laterality: Left;  left - pt knows to arrive at 7:45, CDE: 3.55   COLONOSCOPY  11/24/2004   Polyps in the left colon ablated/removed as described above.  Two submucosal lesions consistent with lipomas as described above not  manipulated./ Normal rectum   COLONOSCOPY  06/20/2012   MULTIPLE RECTAL AND COLONIC POLYPS   COLONOSCOPY N/A 03/08/2015   RMR: Capacious, redundant colon. Multiple colonic and rectosigmoid polyps removed. ablated as described above. colonic lipoma abnormal appearing terminal ileum likely a variant of normal). Howeverwith history  biopsies obtained.     ESOPHAGOGASTRODUODENOSCOPY N/A 03/08/2015   RMR: Hiatal hernia Polypoid gastric mucosa with multiple gastric polyps. largest polyp removed via snare polypectomgy hemostasis clip placed at base. Status post gastric biopsy. Status post video capsule placement.    GIVENS CAPSULE STUDY N/A 03/08/2015   Procedure: GIVENS CAPSULE STUDY;  Surgeon: Daneil Dolin, MD;  [facility];  Service: Endoscopy;  Laterality: N/A;   LIMBAL STEM CELL TRANSPLANT     LUNG BIOPSY  6/06   MULTIPLE TOOTH EXTRACTIONS  04/2005   port a cath placement     PORT-A-CATH REMOVAL  09/08/2012   Procedure: REMOVAL PORT-A-CATH;  Surgeon: Melrose Nakayama, MD;  Location: [place];  Service: Thoracic;  Laterality: N/A;    Allergies:  Allergies  Allergen Reactions   Penicillins Rash    Has patient had a PCN reaction causing immediate rash, facial/tongue/throat swelling, SOB or lightheadedness with hypotension: Unknown Has patient had a PCN reaction causing severe rash involving mucus membranes or skin necrosis: Unknown Has patient had a PCN reaction that required hospitalization: Unknown Has patient had a PCN reaction occurring within the last 10 years: No If all of the above answers are "NO", then may proceed with Cephalosporin use.     Family History:  Family History  Problem Relation Age of Onset   Cancer Mother        lung   Hypertension Mother    Hyperlipidemia Mother    Cancer Father        prostate   Hypertension Father    Colon cancer Neg Hx    Diabetes Neg Hx     Social History:  Social History   Tobacco Use   Smoking status: Former    Packs/day: 0.50    Years: 15.00    Total pack years: 7.50    Types: Cigarettes    Quit date: 11/11/2002    Years since quitting: 19.7   Smokeless tobacco: Never  Vaping Use   Vaping Use: Never used  Substance Use Topics   Alcohol use: No    Alcohol/week: 0.0 standard drinks of alcohol   Drug use: No    ROS: Constitutional:  Negative for fever,  chills, weight loss CV: Negative for chest pain, previous MI, hypertension Respiratory:  Negative for shortness of breath, wheezing, sleep apnea, frequent cough GI:  Negative for nausea, vomiting, bloody stool, GERD  Physical exam: BP (!) 147/94   Pulse 73   Ht 5\' 11"  (1.803 m)   Wt 256 lb 1.6 oz (116.2 kg)   BMI 35.72 kg/m  GENERAL APPEARANCE:  Well appearing, well developed, well nourished, NAD HEENT:  Atraumatic, normocephalic, oropharynx clear NECK:  Supple without lymphadenopathy or thyromegaly ABDOMEN:  Soft, non-tender, no masses EXTREMITIES:  Moves all extremities well, without clubbing, cyanosis, or edema NEUROLOGIC:  Alert and oriented x 3, normal gait, CN II-XII grossly intact MENTAL STATUS:  appropriate BACK:  Non-tender to palpation, No CVAT SKIN:  Warm, dry, and intact     Results: U/A:  0-5 WBC, 0-2 RBC

## 2022-07-21 LAB — MICROSCOPIC EXAMINATION: Bacteria, UA: NONE SEEN

## 2022-07-21 LAB — URINALYSIS, ROUTINE W REFLEX MICROSCOPIC
Bilirubin, UA: NEGATIVE
Glucose, UA: NEGATIVE
Ketones, UA: NEGATIVE
Leukocytes,UA: NEGATIVE
Nitrite, UA: NEGATIVE
Specific Gravity, UA: 1.02 (ref 1.005–1.030)
Urobilinogen, Ur: 2 mg/dL — ABNORMAL HIGH (ref 0.2–1.0)
pH, UA: 7 (ref 5.0–7.5)

## 2022-07-21 NOTE — Progress Notes (Signed)
GU Location of Tumor / Histology: Prostate Ca  If Prostate Cancer, Gleason Score is (4 + 3) and PSA is (5.3 as of 04/2022)  Biopsies     Past/Anticipated interventions by urology, if any: NA  Past/Anticipated interventions by medical oncology, if any: NA  Weight changes, if any: {:18581}  IPSS: SHIM:  Bowel/Bladder complaints, if any: {:18581}   Nausea/Vomiting, if any: {:18581}  Pain issues, if any:  {:18581}  SAFETY ISSUES: Prior radiation? {:18581} Pacemaker/ICD? {:18581} Possible current pregnancy? Male Is the patient on methotrexate? No  Current Complaints / other details:

## 2022-07-23 ENCOUNTER — Ambulatory Visit
Admission: RE | Admit: 2022-07-23 | Discharge: 2022-07-23 | Disposition: A | Discharge status: Home / Self Care | Payer: Medicare Other | Source: Ambulatory Visit | Attending: Radiation Oncology | Admitting: Radiation Oncology

## 2022-07-23 ENCOUNTER — Telehealth: Payer: Self-pay | Admitting: Cardiology

## 2022-07-23 VITALS — BP 163/76 | HR 80 | Temp 97.6°F | Resp 18 | Ht 71.0 in | Wt 251.5 lb

## 2022-07-23 DIAGNOSIS — Z801 Family history of malignant neoplasm of trachea, bronchus and lung: Secondary | ICD-10-CM | POA: Diagnosis not present

## 2022-07-23 DIAGNOSIS — Z794 Long term (current) use of insulin: Secondary | ICD-10-CM | POA: Diagnosis not present

## 2022-07-23 DIAGNOSIS — C61 Malignant neoplasm of prostate: Secondary | ICD-10-CM | POA: Insufficient documentation

## 2022-07-23 DIAGNOSIS — Z79899 Other long term (current) drug therapy: Secondary | ICD-10-CM | POA: Insufficient documentation

## 2022-07-23 DIAGNOSIS — E119 Type 2 diabetes mellitus without complications: Secondary | ICD-10-CM | POA: Diagnosis not present

## 2022-07-23 DIAGNOSIS — Z87891 Personal history of nicotine dependence: Secondary | ICD-10-CM | POA: Insufficient documentation

## 2022-07-23 DIAGNOSIS — Z9484 Stem cells transplant status: Secondary | ICD-10-CM | POA: Insufficient documentation

## 2022-07-23 DIAGNOSIS — Z9221 Personal history of antineoplastic chemotherapy: Secondary | ICD-10-CM | POA: Diagnosis not present

## 2022-07-23 DIAGNOSIS — D509 Iron deficiency anemia, unspecified: Secondary | ICD-10-CM | POA: Insufficient documentation

## 2022-07-23 DIAGNOSIS — E782 Mixed hyperlipidemia: Secondary | ICD-10-CM | POA: Diagnosis not present

## 2022-07-23 DIAGNOSIS — Z7901 Long term (current) use of anticoagulants: Secondary | ICD-10-CM | POA: Diagnosis not present

## 2022-07-23 DIAGNOSIS — M47814 Spondylosis without myelopathy or radiculopathy, thoracic region: Secondary | ICD-10-CM | POA: Diagnosis not present

## 2022-07-23 DIAGNOSIS — Z8042 Family history of malignant neoplasm of prostate: Secondary | ICD-10-CM | POA: Insufficient documentation

## 2022-07-23 DIAGNOSIS — Z7984 Long term (current) use of oral hypoglycemic drugs: Secondary | ICD-10-CM | POA: Diagnosis not present

## 2022-07-23 DIAGNOSIS — Z191 Hormone sensitive malignancy status: Secondary | ICD-10-CM | POA: Diagnosis not present

## 2022-07-23 DIAGNOSIS — D591 Autoimmune hemolytic anemia, unspecified: Secondary | ICD-10-CM | POA: Diagnosis not present

## 2022-07-23 DIAGNOSIS — Z923 Personal history of irradiation: Secondary | ICD-10-CM | POA: Insufficient documentation

## 2022-07-23 DIAGNOSIS — Z8572 Personal history of non-Hodgkin lymphomas: Secondary | ICD-10-CM | POA: Diagnosis not present

## 2022-07-23 DIAGNOSIS — I110 Hypertensive heart disease with heart failure: Secondary | ICD-10-CM | POA: Diagnosis not present

## 2022-07-23 DIAGNOSIS — E669 Obesity, unspecified: Secondary | ICD-10-CM | POA: Diagnosis not present

## 2022-07-23 NOTE — Progress Notes (Signed)
Radiation Oncology         [phone] ________________________________  Initial Outpatient Consultation  Name: [name] MRN: [id number]  Date: 07/23/2022  DOB: 06-01-1951  CC:Redmond School, MD  Primus Bravo., *   REFERRING PHYSICIAN: Primus Bravo., *  DIAGNOSIS: 71 y.o. gentleman with Stage T1c adenocarcinoma of the prostate with Gleason score of 4+3, and PSA of 5.3.    ICD-10-CM   1. Malignant neoplasm of prostate ([place])  C61       HISTORY OF PRESENT ILLNESS: [name] is a 71 y.o. male with a diagnosis of prostate cancer. He was noted to have an elevated PSA of 4.55 on routine labs with his primary care physician, Dr. Gerarda Fraction on 02/23/22.  Accordingly, he was referred for evaluation in urology by Dr. Felipa Eth on 05/14/22,  digital rectal examination was performed at that time revealing no discrete nodules although palpation of the base was difficult.  The patient proceeded to transrectal ultrasound with 12 biopsies of the prostate on 06/09/22.  The prostate volume measured 20.55 cc.  Out of 12 core biopsies, 8 were positive.  The maximum Gleason score was 4+3, and this was seen in the left mid lateral. Additionally, there was Gleason 3+4 in the left base, left base lateral, left apex and right mid and Gleason 3+3 in the left apex lateral, right apex and right mid lateral.  The patient reviewed the biopsy results with his urologist and he has kindly been referred today for discussion of potential radiation treatment options.  Of note, he also has a h/o DLBCL, diagnosed in 2006 and treated with multiple courses of chemotherapy, total body radiation and stem cell transplant in 2007. He has been in remission since 11/2007. He also has autoimmune hemolytic anemia that is followed by Faythe Casa, NP at the [facility].  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   autologous stem cell transplant 12/2005   Obesity, morbid (more than 100 lbs over ideal weight or BMI > 40) ([place])    Venous insufficiency 04/29/2011      PAST SURGICAL HISTORY: Past Surgical History:  Procedure Laterality Date   BACK SURGERY  2006   T6 vertebrae removed/titanium placed   CATARACT EXTRACTION W/PHACO Right 03/31/2019   Procedure: CATARACT EXTRACTION PHACO AND INTRAOCULAR LENS PLACEMENT ([place]);  Surgeon: Baruch Goldmann, MD;  Location[facility];  Service: Ophthalmology;  Laterality: Right;  CDE: 3.21   CATARACT EXTRACTION W/PHACO Left 04/14/2019   Procedure: CATARACT EXTRACTION PHACO AND INTRAOCULAR LENS PLACEMENT ([facility]);  Surgeon: Baruch Goldmann, MD;  Location: [facility];  Service: Ophthalmology;  Laterality: Left;  left - pt knows to arrive at 7:45, CDE: 3.55   COLONOSCOPY  11/24/2004   Polyps in the left colon ablated/removed as described above.  Two submucosal lesions consistent with lipomas as described above not  manipulated./ Normal rectum   COLONOSCOPY  06/20/2012   MULTIPLE RECTAL AND COLONIC POLYPS   COLONOSCOPY N/A 03/08/2015   RMR: Capacious, redundant colon. Multiple colonic  and rectosigmoid polyps removed. ablated as described above. colonic lipoma abnormal appearing terminal ileum likely a variant of normal). Howeverwith history  biopsies obtained.    ESOPHAGOGASTRODUODENOSCOPY N/A 03/08/2015   RMR: Hiatal hernia Polypoid gastric mucosa with multiple gastric polyps. largest polyp removed via snare polypectomgy hemostasis clip placed at base. Status post gastric biopsy. Status post video capsule placement.    GIVENS CAPSULE STUDY N/A 03/08/2015   Procedure: GIVENS  CAPSULE STUDY;  Surgeon: Daneil Dolin, MD;  [facility];  Service: Endoscopy;  Laterality: N/A;   LIMBAL STEM CELL TRANSPLANT     LUNG BIOPSY  6/06   MULTIPLE TOOTH EXTRACTIONS  04/2005   port a cath placement     PORT-A-CATH REMOVAL  09/08/2012   Procedure: REMOVAL PORT-A-CATH;  Surgeon: Melrose Nakayama, MD;  Location: [place];  Service: Thoracic;  Laterality: N/A;    FAMILY HISTORY:  Family History  Problem Relation Age of Onset   Cancer Mother        lung   Hypertension Mother    Hyperlipidemia Mother    Cancer Father        prostate   Hypertension Father    Colon cancer Neg Hx    Diabetes Neg Hx     SOCIAL HISTORY:  Social History   Socioeconomic History   Marital status: Married    Spouse name: Not on file   Number of children: 2   Years of education: Not on file   Highest education level: Not on file  Occupational History   Occupation: disability due to back    Employer: UNEMPLOYED  Tobacco Use   Smoking status: Former    Packs/day: 0.50    Years: 15.00    Total pack years: 7.50    Types: Cigarettes    Quit date: 11/11/2002    Years since quitting: 19.7   Smokeless tobacco: Never  Vaping Use   Vaping Use: Never used  Substance and Sexual Activity   Alcohol use: No    Alcohol/week: 0.0 standard drinks of alcohol   Drug use: No   Sexual activity: Yes    Birth control/protection: None  Other Topics Concern   Not on file  Social History Narrative   Married   No regular exercise   Social Determinants of Health   Financial Resource Strain: Low Risk  (06/28/2020)   Overall Financial Resource Strain (CARDIA)    Difficulty of Paying Living Expenses: Not hard at all  Food Insecurity: No Food Insecurity (06/28/2020)   Hunger Vital Sign    Worried About Running Out of Food in the Last Year: Never true    Ran Out of Food in the Last Year: Never true  Transportation Needs: No Transportation Needs (06/28/2020)   PRAPARE - Civil engineer, contracting (Medical): No    Lack of Transportation (Non-Medical): No  Physical Activity: Inactive (06/28/2020)   Exercise Vital Sign    Days of Exercise per Week: 0 days    Minutes of Exercise per Session: 0 min  Stress: No Stress Concern Present (06/28/2020)   [place]    Feeling of Stress : Not at all  Social Connections: Moderately Integrated (06/28/2020)   Social Connection and Isolation Panel [NHANES]    Frequency of Communication with Friends and Family: More than three times a week    Frequency of Social Gatherings with Friends and Family: Twice a week    Attends  Religious Services: More than 4 times per year    Active Member of Clubs or Organizations: No    Attends Archivist Meetings: Never    Marital Status: Married  Human resources officer Violence: Not At Risk (06/28/2020)   Humiliation, Afraid, Rape, and Kick questionnaire    Fear of Current or Ex-Partner: No    Emotionally Abused: No    Physically Abused: No    Sexually Abused: No    ALLERGIES: Penicillins  MEDICATIONS:  Current Outpatient Medications  Medication Sig Dispense Refill   apixaban (ELIQUIS) 5 MG TABS tablet Take 1 tablet (5 mg total) by mouth 2 (two) times daily. 60 tablet 11   B-D ULTRAFINE III SHORT PEN 31G X 8 MM MISC USE EVERY DAY 100 each 3   Blood Glucose Monitoring Suppl (ONE TOUCH ULTRA 2) w/Device KIT USE TO TEST BLOOD SUGAR ONCE D UTD     clotrimazole-betamethasone (LOTRISONE) cream Apply 1 application topically 2 (two) times daily as needed (leg infection).      ergocalciferol (VITAMIN D2) 1.25 MG (50000 UT) capsule Take 1 capsule (50,000 Units total) by mouth once a week. 16 capsule 3   folic acid (FOLVITE) 1 MG tablet TAKE 1 TABLET(1 MG) BY MOUTH DAILY 30 tablet 4   furosemide (LASIX) 20 MG tablet Take 20 mg by mouth daily as needed for fluid or edema.   0   insulin degludec (TRESIBA FLEXTOUCH) 100 UNIT/ML FlexTouch  Pen Inject 30 Units into the skin at bedtime. 30 mL 3   Lancets (ONETOUCH DELICA PLUS [id number]) MISC TEST TWICE DAILY 300 each 3   metFORMIN (GLUCOPHAGE) 500 MG tablet TAKE 1 TABLET(500 MG) BY MOUTH TWICE DAILY WITH A MEAL 180 tablet 3   metoprolol tartrate (LOPRESSOR) 25 MG tablet Take 1 tablet (25 mg total) by mouth 2 (two) times daily. 180 tablet 3   ONETOUCH ULTRA test strip TEST TWICE DAILY 100 strip 3   rosuvastatin (CRESTOR) 40 MG tablet Take 40 mg by mouth daily.     vitamin B-12 (CYANOCOBALAMIN) 1000 MCG tablet Take 2 tablets (2,000 mcg total) by mouth daily. 90 tablet 3   No current facility-administered medications for this encounter.    REVIEW OF SYSTEMS:  On review of systems, the patient reports that he is doing well overall. He denies any chest pain, cough, fevers, chills, night sweats, or unintended weight changes. He has had some increased shortness of breath with activities and easy fatigability. He denies any bowel disturbances, and denies abdominal pain, nausea or vomiting. He denies any new musculoskeletal or joint aches or pains. His IPSS was 9, indicating mild urinary symptoms. His SHIM was 6, indicating he has severe erectile dysfunction. A complete review of systems is obtained and is otherwise negative.    PHYSICAL EXAM:  Wt Readings from Last 3 Encounters:  07/23/22 251 lb 8 oz (114.1 kg)  07/20/22 256 lb 1.6 oz (116.2 kg)  05/14/22 251 lb 9.6 oz (114.1 kg)   Temp Readings from Last 3 Encounters:  07/23/22 97.6 F (36.4 C) (Oral)  06/09/22 98.1 F (36.7 C) (Oral)  05/29/21 97.7 F (36.5 C)   BP Readings from Last 3 Encounters:  07/23/22 (!) 163/76  07/20/22 (!) 147/94  06/22/22 136/68   Pulse Readings from Last 3 Encounters:  07/23/22 80  07/20/22 73  06/22/22 61    /10  In general this is a well appearing African American male in no acute distress. He's alert and oriented x4 and appropriate throughout  the examination. Cardiopulmonary assessment is  negative for acute distress, and he exhibits normal effort.     KPS = 80  100 - Normal; no complaints; no evidence of disease. 90   - Able to carry on normal activity; minor signs or symptoms of disease. 80   - Normal activity with effort; some signs or symptoms of disease. 38   - Cares for self; unable to carry on normal activity or to do active work. 60   - Requires occasional assistance, but is able to care for most of his personal needs. 50   - Requires considerable assistance and frequent medical care. 31   - Disabled; requires special care and assistance. 69   - Severely disabled; hospital admission is indicated although death not imminent. 20   - Very sick; hospital admission necessary; active supportive treatment necessary. 10   - Moribund; fatal processes progressing rapidly. 0     - Dead  Karnofsky DA, Abelmann [place], Craver LS and Burchenal JH [phone]) The use of the nitrogen mustards in the palliative treatment of carcinoma: with particular reference to bronchogenic carcinoma Cancer 1 634-56  LABORATORY DATA:  Lab Results  Component Value Date   WBC 3.4 (L) 06/28/2020   HGB 13.2 06/28/2020   HCT 40.3 06/28/2020   MCV 98.3 06/28/2020   PLT 145 (L) 06/28/2020   Lab Results  Component Value Date   NA 142 07/30/2021   K 4.4 07/30/2021   CL 103 07/30/2021   CO2 25 07/30/2021   Lab Results  Component Value Date   ALT 24 07/30/2021   AST 23 07/30/2021   ALKPHOS 71 07/30/2021   BILITOT 0.4 07/30/2021     RADIOGRAPHY: DG Chest 2 View  Result Date: 07/08/2022 CLINICAL DATA:  Cough EXAM: CHEST - 2 VIEW COMPARISON:  Chest radiograph 08/07/2019; PET-CT August 14, 2019 FINDINGS: Stable cardiac and mediastinal contours. Bibasilar heterogeneous opacities. Blunting of the costophrenic angles bilaterally. Thoracic spine degenerative changes. Thoracic spinal hardware. IMPRESSION: 1. Blunting of the costophrenic angles bilaterally may represent trace effusions. 2. Bibasilar  atelectasis. Electronically Signed   By: Lovey Newcomer M.D.   On: 07/08/2022 15:23      IMPRESSION/PLAN: 1. 71 y.o. gentleman with Stage T1c adenocarcinoma of the prostate with Gleason Score of 4+3, and PSA of 5.3. We discussed the patient's workup and outlined the nature of prostate cancer in this setting. The patient's T stage, Gleason's score, and PSA put him into the unfavorable intermediate risk group. Accordingly, he is eligible for a variety of potential treatment options including prostatectomy, brachytherapy, or 5.5 weeks of external radiation +/- ST-ADT. We discussed the available radiation techniques, and focused on the details and logistics of delivery. We discussed and outlined the risks, benefits, short and long-term effects associated with radiotherapy and compared and contrasted these with prostatectomy. We discussed the role of SpaceOAR gel in reducing the rectal toxicity associated with radiotherapy. We also detailed the role of ADT in the treatment of unfavorable intermediate risk prostate cancer but in light of his current symptoms with easy fatigability, DM and heart failure, we feel that the risks outweigh any possible small benefit.  He was encouraged to ask questions that were answered to his stated satisfaction.  At the conclusion of our conversation, the patient is interested in moving forward with 5.5 weeks of external beam therapy without ADT. We will share our discussion with Dr. Felipa Eth and make arrangements for fiducial markers and SpaceOAR gel placement, first available, prior to simulation,  to reduce rectal toxicity from radiotherapy. The patient appears to have a good understanding of his disease and our treatment recommendations which are of curative intent and is in agreement with the stated plan.  Therefore, we will move forward with treatment planning accordingly, in anticipation of beginning IMRT in the near future.  He will need help with transportation to and from his  daily treatments so we will connect him with the transportation assistance team here in the cancer center.  2. Increased shortness of breath with activities and easy fatigability.  Given his history of dyastolic dysfunction and autoimmune hemolytic anemia, I have advised that he follow up with his cardiologist and medical oncology. He thinks he is overdue for a follow up with hematology/oncology anyway. I have forwarded a copy of my note to Dr. Harl Bowie and sent an in-basket message to Faythe Casa, NP requesting they reach out to the patient to determine recommended evaluation/management of his symptoms.  We personally spent 70 minutes in this encounter including chart review, reviewing radiological studies, meeting face-to-face with the patient, entering orders and completing documentation.    Nicholos Johns, PA-C    Tyler Pita, MD  [facility] Direct Dial: [phone]  Fax: [phone] [email or web address]  Skype  [email or web address]

## 2022-07-23 NOTE — Telephone Encounter (Signed)
Brayton Layman is calling from Dr. Johny Shears office with the cancer center stating that the patient was complaining of weakness and increased SOB on exertion. She also reports the patient stated he recently got off medication for a bacterial infection. Dr. Johny Shears PA sent Dr. Harl Bowie a message regarding this. Please advise.

## 2022-07-23 NOTE — Telephone Encounter (Signed)
Patient last seen by Cardiology 04/2021, was supposed to return in 6 months-March 2023.It looks like he has apt made for 09/18/2022   I will forward to Sam Rayburn for review of PA note from Dr.Manning's office.

## 2022-07-24 ENCOUNTER — Telehealth: Payer: Self-pay

## 2022-07-24 NOTE — Telephone Encounter (Signed)
[name] called with a referral for marker placement with Dr. Felipa Eth.  I spoke to the patient and he would like to say local and elected to be scheduled with Dr. Alyson Ingles for this procedure.  Can you please reach out to shirley to confirm referral.

## 2022-07-24 NOTE — Telephone Encounter (Signed)
I relayed Dr.Branch's message and patient agrees to call Dr.Fusco.

## 2022-07-24 NOTE — Telephone Encounter (Signed)
Needs to see pcp, weakness and SOB could originate from several possible causes not specifically cardiac. If pcp feels on there evaluation its a cardiac issue then could arrange earlier appt  Zandra Abts MD

## 2022-07-24 NOTE — Telephone Encounter (Signed)
Attempted to call pt- No answer/No voicemail.

## 2022-07-26 DIAGNOSIS — Z191 Hormone sensitive malignancy status: Secondary | ICD-10-CM | POA: Diagnosis not present

## 2022-07-29 NOTE — Telephone Encounter (Signed)
Enid Derry called to check on status of marker placement.  Please call her back directly at  [phone].  Thanks, Helene Kelp

## 2022-07-30 ENCOUNTER — Ambulatory Visit: Payer: Medicare Other | Attending: Student | Admitting: Student

## 2022-07-30 ENCOUNTER — Encounter: Payer: Self-pay | Admitting: Student

## 2022-07-30 VITALS — BP 136/82 | HR 71 | Ht 71.0 in | Wt 243.0 lb

## 2022-07-30 DIAGNOSIS — I5032 Chronic diastolic (congestive) heart failure: Secondary | ICD-10-CM

## 2022-07-30 DIAGNOSIS — I10 Essential (primary) hypertension: Secondary | ICD-10-CM

## 2022-07-30 DIAGNOSIS — I4892 Unspecified atrial flutter: Secondary | ICD-10-CM | POA: Diagnosis not present

## 2022-07-30 DIAGNOSIS — R5383 Other fatigue: Secondary | ICD-10-CM | POA: Diagnosis not present

## 2022-07-30 DIAGNOSIS — E785 Hyperlipidemia, unspecified: Secondary | ICD-10-CM | POA: Diagnosis not present

## 2022-07-30 DIAGNOSIS — R0609 Other forms of dyspnea: Secondary | ICD-10-CM | POA: Diagnosis not present

## 2022-07-30 MED ORDER — FUROSEMIDE 20 MG PO TABS: 20.0000 mg | ORAL_TABLET | Freq: Every day | ORAL | 2 refills | Status: DC | PRN

## 2022-07-30 NOTE — Patient Instructions (Signed)
Medication Instructions:  Your physician recommends that you continue on your current medications as directed. Please refer to the Current Medication list given to you today.   I refilled your Lasix    Labwork: CBC,BMET,Iron,Ferritin,BNP  Testing/Procedures: Your physician has requested that you have an echocardiogram. Echocardiography is a painless test that uses sound waves to create images of your heart. It provides your doctor with information about the size and shape of your heart and how well your heart's chambers and valves are working. This procedure takes approximately one hour. There are no restrictions for this procedure. Please do NOT wear cologne, perfume, aftershave, or lotions (deodorant is allowed). Please arrive 15 minutes prior to your appointment time.   Follow-Up: Keep apt already scheduled with Dr.Branch  Any Other Special Instructions Will Be Listed Below (If Applicable).  If you need a refill on your cardiac medications before your next appointment, please call your pharmacy.

## 2022-07-30 NOTE — Progress Notes (Signed)
Cardiology Office Note    Date:  07/30/2022   ID:  CURT OATIS, DOB 01-Jun-1951, MRN [id number]  PCP:  Redmond School, MD  Cardiologist: Carlyle Dolly, MD    Chief Complaint  Patient presents with   Follow-up    Dyspnea on Exertion and Fatigue    History of Present Illness:    [name] is a 71 y.o. male with past medical history of paroxysmal atrial flutter (diagnosed in 08/2017), HFpEF, diffuse large B-cell lymphoma (in remission), HTN, Type 2 DM and recently diagnosed prostate cancer who presents to the office today for evaluation of worsening dyspnea on exertion and fatigue.  He was last examined by Dr. Harl Bowie in 04/2021 denied any recent palpitations at that time. He did report occasional chest discomfort which would occur at rest but no association with exertion. He was continued on his current cardiac medications at that time including Eliquis 5 mg twice daily, Lasix 20 mg PRN, Lopressor 25 mg twice daily and Crestor 40 mg daily.  [facility] contacted our office earlier this month reporting the patient was reporting worsening fatigue and dyspnea on exertion. Dr. Harl Bowie recommended that he follow-up with his PCP initially and then arrange Cardiology follow-up if indicated going forward.  In talking with the patient and his wife today, he reports having worsening dyspnea on exertion last month and feels like this was secondary to weather changes at that time. Says his dyspnea has improved over the past few weeks. He does report generalized fatigue and his wife reports this resembles when he had anemia in the past. He has not had routine labs by his PCP since 01/2022. He does report having chronic lower extremity edema but no specific orthopnea or PND. No recent exertional chest pain or persistent palpitations. He does remain on Eliquis for anticoagulation with no recent melena, hematochezia or hematuria.   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   autologous stem cell transplant 12/2005   Obesity, morbid (more than 100 lbs over ideal weight or BMI > 40) ([place])    Venous insufficiency 04/29/2011    Past Surgical History:  Procedure Laterality Date   BACK SURGERY  2006   T6 vertebrae removed/titanium placed   CATARACT EXTRACTION W/PHACO Right 03/31/2019   Procedure: CATARACT EXTRACTION PHACO AND INTRAOCULAR LENS PLACEMENT ([place]);  Surgeon: Baruch Goldmann, MD;  Location: [facility];  Service: Ophthalmology;  Laterality: Right;  CDE: 3.21   CATARACT EXTRACTION W/PHACO Left 04/14/2019   Procedure: CATARACT EXTRACTION PHACO AND INTRAOCULAR LENS PLACEMENT ([facility]);  Surgeon: Baruch Goldmann, MD;  Location: [facility];  Service: Ophthalmology;  Laterality: Left;  left - pt knows to arrive at 7:45, CDE: 3.55   COLONOSCOPY  11/24/2004   Polyps in the left colon ablated/removed as described above.  Two submucosal lesions consistent with lipomas as described above not  manipulated./ Normal rectum   COLONOSCOPY  06/20/2012   MULTIPLE RECTAL AND COLONIC POLYPS   COLONOSCOPY N/A 03/08/2015   RMR: Capacious, redundant colon. Multiple colonic and rectosigmoid polyps removed. ablated as described above. colonic lipoma abnormal appearing terminal ileum likely a variant of  normal). Howeverwith history  biopsies obtained.    ESOPHAGOGASTRODUODENOSCOPY N/A 03/08/2015   RMR: Hiatal hernia Polypoid gastric mucosa with multiple gastric polyps. largest polyp removed via snare polypectomgy hemostasis clip placed at base. Status post gastric biopsy. Status post video capsule placement.    GIVENS CAPSULE STUDY N/A 03/08/2015   Procedure: GIVENS CAPSULE STUDY;  Surgeon: Daneil Dolin, MD;  Location: [facility];  Service: Endoscopy;  Laterality: N/A;   LIMBAL STEM CELL TRANSPLANT     LUNG BIOPSY  6/06   MULTIPLE TOOTH EXTRACTIONS  04/2005   port a cath placement     PORT-A-CATH REMOVAL  09/08/2012   Procedure: REMOVAL PORT-A-CATH;  Surgeon: Melrose Nakayama, MD;  Location: [facility];  Service: Thoracic;  Laterality: N/A;    Current Medications: Outpatient Medications Prior to Visit  Medication Sig Dispense Refill   apixaban (ELIQUIS) 5 MG TABS tablet Take 1 tablet (5 mg total) by mouth 2 (two) times daily. 60 tablet 11   B-D ULTRAFINE III SHORT PEN 31G X 8 MM MISC USE EVERY DAY 100 each 3   Blood Glucose Monitoring Suppl (ONE TOUCH ULTRA 2) w/Device KIT USE TO TEST BLOOD SUGAR ONCE D UTD     clotrimazole-betamethasone (LOTRISONE) cream Apply 1 application topically 2 (two) times daily as needed (leg infection).      ergocalciferol (VITAMIN D2) 1.25 MG (50000 UT) capsule Take 1 capsule (50,000 Units total) by mouth once a week. 16 capsule 3   folic acid (FOLVITE) 1 MG tablet TAKE 1 TABLET(1 MG) BY MOUTH DAILY 30 tablet 4   insulin degludec (TRESIBA FLEXTOUCH) 100 UNIT/ML FlexTouch Pen Inject 30 Units into the skin at bedtime. 30 mL 3   Lancets (ONETOUCH DELICA PLUS [id number]) MISC TEST TWICE DAILY 300 each 3   metFORMIN (GLUCOPHAGE) 500 MG tablet TAKE 1 TABLET(500 MG) BY MOUTH TWICE DAILY WITH A MEAL 180 tablet 3   metoprolol tartrate (LOPRESSOR) 25 MG tablet Take 1 tablet (25 mg total) by mouth 2 (two) times daily. 180 tablet 3   ONETOUCH ULTRA test strip TEST TWICE DAILY 100 strip 3   rosuvastatin (CRESTOR) 40 MG tablet Take 40 mg by mouth daily.     vitamin B-12 (CYANOCOBALAMIN) 1000 MCG tablet Take 2 tablets (2,000 mcg total) by mouth daily. 90 tablet 3   furosemide (LASIX) 20 MG tablet Take 20 mg by mouth daily as needed for fluid or edema.   0   No facility-administered medications prior to visit.     Allergies:   Penicillins   Social History   Socioeconomic History    Marital status: Married    Spouse name: Not on file   Number of children: 2   Years of education: Not on file   Highest education level: Not on file  Occupational History   Occupation: disability due to back    Employer: UNEMPLOYED  Tobacco Use   Smoking status: Former    Packs/day: 0.50    Years: 15.00    Total pack years: 7.50    Types: Cigarettes    Quit date: 11/11/2002    Years since quitting: 19.7   Smokeless tobacco: Never  Vaping Use   Vaping Use: Never used  Substance and Sexual Activity   Alcohol use: No    Alcohol/week: 0.0 standard drinks of alcohol   Drug use: No   Sexual activity: Yes    Birth control/protection: None  Other Topics Concern   Not on file  Social History Narrative  Married   No regular exercise   Social Determinants of Health   Financial Resource Strain: Low Risk  (06/28/2020)   Overall Financial Resource Strain (CARDIA)    Difficulty of Paying Living Expenses: Not hard at all  Food Insecurity: No Food Insecurity (06/28/2020)   Hunger Vital Sign    Worried About Running Out of Food in the Last Year: Never true    Ran Out of Food in the Last Year: Never true  Transportation Needs: No Transportation Needs (06/28/2020)   PRAPARE - Hydrologist (Medical): No    Lack of Transportation (Non-Medical): No  Physical Activity: Inactive (06/28/2020)   Exercise Vital Sign    Days of Exercise per Week: 0 days    Minutes of Exercise per Session: 0 min  Stress: No Stress Concern Present (06/28/2020)   [place]    Feeling of Stress : Not at all  Social Connections: Moderately Integrated (06/28/2020)   Social Connection and Isolation Panel [NHANES]    Frequency of Communication with Friends and Family: More than three times a week    Frequency of Social Gatherings with Friends and Family: Twice a week    Attends Religious Services: More than 4 times per year     Active Member of [employer] or Organizations: No    Attends Music therapist: Never    Marital Status: Married     Family History:  The patient's family history includes Cancer in his father and mother; Hyperlipidemia in his mother; Hypertension in his father and mother.   Review of Systems:    Please see the history of present illness.     All other systems reviewed and are otherwise negative except as noted above.   Physical Exam:    VS:  BP 136/82   Pulse 71   Ht _0  (1.803 m)   Wt 243 lb (110.2 kg)   SpO2 99%   BMI 33.89 kg/m    General: Well developed, well nourished,male appearing in no acute distress. Head: Normocephalic, atraumatic. Neck: No carotid bruits. JVD not elevated.  Lungs: Respirations regular and unlabored, without wheezes or rales.  Heart: Irregularly irregular. No S3 or S4. No murmur, no rubs, or gallops appreciated. Abdomen: Appears non-distended. No obvious abdominal masses. Msk:  Strength and tone appear normal for age. No obvious joint deformities or effusions. Extremities: No clubbing or cyanosis. Chronic appearing lymphedema. Distal pedal pulses are 2+ bilaterally. Neuro: Alert and oriented X 3. Moves all extremities spontaneously. No focal deficits noted. Psych:  Responds to questions appropriately with a normal affect. Skin: No rashes or lesions noted  Wt Readings from Last 3 Encounters:  07/30/22 243 lb (110.2 kg)  07/23/22 251 lb 8 oz (114.1 kg)  07/20/22 256 lb 1.6 oz (116.2 kg)     Studies/Labs Reviewed:   EKG:  EKG is ordered today. The EKG ordered today demonstrates rate-controlled atrial flutter, HR 71 with PVC's.   Recent Labs: No results found for requested labs within last 365 days.   Lipid Panel    Component Value Date/Time   CHOL 133 07/30/2021 0923   TRIG 105 07/30/2021 0923   HDL 49 07/30/2021 0923   CHOLHDL 2.7 07/30/2021 0923   LDLCALC 65 07/30/2021 0923    Additional studies/ records that were reviewed  today include:   NST: 03/2015 There was no ST segment deviation noted during stress. Defect 1: There is a small defect  of moderate severity present in the basal inferior location. Due to soft tissue attenuation artifact. This is a low risk study. The left ventricular ejection fraction is normal (55-65%).  Echocardiogram: 08/2017 Study Conclusions   - Procedure narrative: Transthoracic echocardiography. Image    quality was adequate. Intravenous contrast (Definity) was    administered.  - Left ventricle: The cavity size was normal. Wall thickness was    increased in a pattern of mild LVH. Systolic function was normal.    The estimated ejection fraction was in the range of 60% to 65%.    Wall motion was normal; there were no regional wall motion    abnormalities. The study is not technically sufficient to allow    evaluation of LV diastolic function due to atrial flutter.  - Left atrium: The atrium was mildly dilated.  - Systemic veins: IVC is dilated with normal respiratory variation.    Estimated CVP 8 mmHg.   Assessment:    1. Dyspnea on exertion   2. Other fatigue   3. Atrial flutter with controlled response ([place])   4. Essential hypertension   5. Hyperlipidemia LDL goal <70      Plan:   In order of problems listed above:  1. HFpEF/Dyspnea on Exertion/Fatigue - He reports worsening dyspnea on exertion last month but reports symptoms improved after being started on antibiotics by his PCP.  However, he continues to have worsening fatigue. - Given no recent routine labs since symptom onset, will recheck labs to include a CBC, BMET, BNP, Iron level and Ferritin. I am concerned this could represent recurrent anemia as he reports symptoms resemble when he was diagnosed with this in the past. - He does have chronic lower extremity edema which has been felt to be secondary to venous insufficiency. Was previously taking Lasix 20 mg daily but has been without this. Will restart Lasix  20 mg daily and obtain follow-up labs next week. Will also update an echocardiogram for reassessment of any structural abnormalities.  2. Persistent Atrial Flutter - He denies any recent palpitations and heart rate is well-controlled in the 70's during today's visit. Continue Lopressor 25 mg twice daily for rate control. - No reports of active bleeding. Will check repeat labs as discussed above. Remains on Eliquis 5 mg twice daily for anticoagulation.  3. HTN - His BP was at 136/82 during today's visit. Continue Lopressor 25 mg twice daily and will restart Lasix as discussed above.  4. HLD - Followed by his PCP. He remains on Crestor 40 mg daily.  Medication Adjustments/Labs and Tests Ordered: Current medicines are reviewed at length with the patient today.  Concerns regarding medicines are outlined above.  Medication changes, Labs and Tests ordered today are listed in the Patient Instructions below. Patient Instructions  Medication Instructions:  Your physician recommends that you continue on your current medications as directed. Please refer to the Current Medication list given to you today.   I refilled your Lasix    Labwork: CBC,BMET,Iron,Ferritin,BNP  Testing/Procedures: Your physician has requested that you have an echocardiogram. Echocardiography is a painless test that uses sound waves to create images of your heart. It provides your doctor with information about the size and shape of your heart and how well your heart's chambers and valves are working. This procedure takes approximately one hour. There are no restrictions for this procedure. Please do NOT wear cologne, perfume, aftershave, or lotions (deodorant is allowed). Please arrive 15 minutes prior to your appointment time.  Follow-Up: Keep apt already scheduled with Dr.Branch  Any Other Special Instructions Will Be Listed Below (If Applicable).  If you need a refill on your cardiac medications before your next  appointment, please call your pharmacy.    Signed, Erma Heritage, PA-C  07/30/2022 5:03 PM    [facility] [address] [place], [place] [address] Phone: [phone] Fax: [phone]

## 2022-08-06 ENCOUNTER — Other Ambulatory Visit (HOSPITAL_COMMUNITY)
Admission: RE | Admit: 2022-08-06 | Discharge: 2022-08-06 | Disposition: A | Discharge status: Home / Self Care | Payer: Medicare Other | Source: Ambulatory Visit | Attending: Student | Admitting: Student

## 2022-08-06 ENCOUNTER — Ambulatory Visit (HOSPITAL_COMMUNITY)
Admission: RE | Admit: 2022-08-06 | Discharge: 2022-08-06 | Disposition: A | Discharge status: Home / Self Care | Payer: Medicare Other | Source: Ambulatory Visit | Attending: Student | Admitting: Student

## 2022-08-06 DIAGNOSIS — R0609 Other forms of dyspnea: Secondary | ICD-10-CM

## 2022-08-06 LAB — ECHOCARDIOGRAM COMPLETE
Area-P 1/2: 4.49 cm2
S' Lateral: 2.9 cm

## 2022-08-06 LAB — BASIC METABOLIC PANEL
Anion gap: 7 (ref 5–15)
BUN: 10 mg/dL (ref 8–23)
CO2: 27 mmol/L (ref 22–32)
Calcium: 8.9 mg/dL (ref 8.9–10.3)
Chloride: 107 mmol/L (ref 98–111)
Creatinine, Ser: 1.44 mg/dL — ABNORMAL HIGH (ref 0.61–1.24)
GFR, Estimated: 52 mL/min — ABNORMAL LOW (ref >60)
Glucose, Bld: 90 mg/dL (ref 70–99)
Potassium: 3.4 mmol/L — ABNORMAL LOW (ref 3.5–5.1)
Sodium: 141 mmol/L (ref 135–145)

## 2022-08-06 LAB — CBC
HCT: 37 % — ABNORMAL LOW (ref 39.0–52.0)
Hemoglobin: 11.7 g/dL — ABNORMAL LOW (ref 13.0–17.0)
MCH: 31.1 pg (ref 26.0–34.0)
MCHC: 31.6 g/dL (ref 30.0–36.0)
MCV: 98.4 fL (ref 80.0–100.0)
Platelets: 151 10*3/uL (ref 150–400)
RBC: 3.76 MIL/uL — ABNORMAL LOW (ref 4.22–5.81)
RDW: 14.7 % (ref 11.5–15.5)
WBC: 4 10*3/uL (ref 4.0–10.5)
nRBC: 0 % (ref 0.0–0.2)

## 2022-08-06 LAB — BRAIN NATRIURETIC PEPTIDE: B Natriuretic Peptide: 140 pg/mL — ABNORMAL HIGH (ref 0.0–100.0)

## 2022-08-06 MED ORDER — PERFLUTREN LIPID MICROSPHERE
1.0000 mL | INTRAVENOUS | Status: DC | PRN
  Administered 2022-08-06: 3 mL via INTRAVENOUS

## 2022-08-06 NOTE — Progress Notes (Signed)
*  PRELIMINARY RESULTS* Echocardiogram 2D Echocardiogram has been performed with Definity.  Samuel Germany 08/06/2022, 3:14 PM

## 2022-08-06 NOTE — Telephone Encounter (Signed)
Scheduled office visit with Dr. Alyson Ingles on 01/02 to discuss fiducial markers. Patient aware of appt time/date.

## 2022-08-07 ENCOUNTER — Telehealth: Payer: Self-pay

## 2022-08-07 MED ORDER — POTASSIUM CHLORIDE ER 10 MEQ PO TBCR: EXTENDED_RELEASE_TABLET | ORAL | 1 refills | Status: DC

## 2022-08-07 NOTE — Telephone Encounter (Signed)
Results of echo and lab work discussed with patient. I will forward results to pcp.

## 2022-08-07 NOTE — Telephone Encounter (Signed)
-----   Message from Erma Heritage, [place] sent at 08/07/2022  7:39 AM EST ----- Please let the patient know his hemoglobin is slightly down at 11.7. They were not able to check a specific ferritin or iron level as the specimen was hemolyzed so would recommend this be rechecked by [place] or his PCP in the near future. His fluid level is slightly elevated at 140 (normal less than 100). Kidney function overall stable when compared to prior values but potassium is low at 3.4. Would continue with Lasix as discussed of his office visit but would recommend taking K-dur 10 mEq daily on the days he takes Lasix.   # Also has echo results.

## 2022-08-11 ENCOUNTER — Encounter: Payer: Self-pay | Admitting: Nurse Practitioner

## 2022-08-11 ENCOUNTER — Ambulatory Visit: Payer: Medicare Other | Admitting: Nurse Practitioner

## 2022-08-11 VITALS — BP 162/78 | HR 72 | Ht 71.0 in | Wt 245.2 lb

## 2022-08-11 DIAGNOSIS — E559 Vitamin D deficiency, unspecified: Secondary | ICD-10-CM | POA: Diagnosis not present

## 2022-08-11 DIAGNOSIS — Z794 Long term (current) use of insulin: Secondary | ICD-10-CM

## 2022-08-11 DIAGNOSIS — N1831 Chronic kidney disease, stage 3a: Secondary | ICD-10-CM | POA: Diagnosis not present

## 2022-08-11 DIAGNOSIS — E782 Mixed hyperlipidemia: Secondary | ICD-10-CM | POA: Diagnosis not present

## 2022-08-11 DIAGNOSIS — I10 Essential (primary) hypertension: Secondary | ICD-10-CM

## 2022-08-11 DIAGNOSIS — E1122 Type 2 diabetes mellitus with diabetic chronic kidney disease: Secondary | ICD-10-CM | POA: Diagnosis not present

## 2022-08-11 LAB — POCT GLYCOSYLATED HEMOGLOBIN (HGB A1C): Hemoglobin A1C: 4.9 % (ref 4.0–5.6)

## 2022-08-11 MED ORDER — TRESIBA FLEXTOUCH 100 UNIT/ML ~~LOC~~ SOPN: 20.0000 [IU] | PEN_INJECTOR | Freq: Every day | SUBCUTANEOUS | 3 refills | Status: DC

## 2022-08-11 NOTE — Progress Notes (Signed)
08/11/2022, 9:54 AM  Endocrinology follow-up note     Subjective:    Patient ID: [name], male    DOB: [birth year].  [name] is being seen in follow-up after he was seen in consultation for management of currently uncontrolled symptomatic diabetes requested by  Redmond School, MD.   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/2005   autologous stem cell transplant 12/2005   Obesity, morbid (more than 100 lbs over ideal weight or BMI > 40) ([place])    Venous insufficiency 04/29/2011    Past Surgical History:  Procedure Laterality Date   BACK SURGERY  2006   T6 vertebrae removed/titanium placed   CATARACT EXTRACTION W/PHACO Right 03/31/2019   Procedure: CATARACT EXTRACTION PHACO AND INTRAOCULAR LENS PLACEMENT ([place]);  Surgeon: Baruch Goldmann, MD;  Location: [facility];  Service: Ophthalmology;  Laterality: Right;  CDE: 3.21   CATARACT EXTRACTION W/PHACO Left 04/14/2019   Procedure: CATARACT EXTRACTION PHACO AND INTRAOCULAR LENS PLACEMENT ([facility]);  Surgeon: Baruch Goldmann, MD;  Location: [facility];  Service: Ophthalmology;  Laterality: Left;  left - pt knows to arrive at 7:45, CDE: 3.55   COLONOSCOPY  11/24/2004   Polyps in the left colon ablated/removed as described above.  Two submucosal lesions consistent with lipomas as described above not  manipulated./ Normal rectum   COLONOSCOPY  06/20/2012   MULTIPLE RECTAL AND COLONIC POLYPS   COLONOSCOPY N/A 03/08/2015   RMR: Capacious, redundant colon. Multiple colonic and rectosigmoid polyps removed. ablated as described above. colonic lipoma abnormal appearing terminal ileum likely  a variant of normal). Howeverwith history  biopsies obtained.    ESOPHAGOGASTRODUODENOSCOPY N/A 03/08/2015   RMR: Hiatal hernia Polypoid gastric mucosa with multiple gastric polyps. largest polyp removed via snare polypectomgy hemostasis clip placed at base. Status post gastric biopsy. Status post video capsule placement.    GIVENS CAPSULE STUDY N/A 03/08/2015   Procedure: GIVENS CAPSULE STUDY;  Surgeon: Daneil Dolin, MD;  Location: [facility];  Service: Endoscopy;  Laterality: N/A;   LIMBAL STEM CELL TRANSPLANT     LUNG BIOPSY  6/06   MULTIPLE TOOTH EXTRACTIONS  04/2005   port a cath placement     PORT-A-CATH REMOVAL  09/08/2012   Procedure: REMOVAL PORT-A-CATH;  Surgeon: Melrose Nakayama, MD;  Location[facility];  Service: Thoracic;  Laterality: N/A;    Social History   Socioeconomic History   Marital status: Married    Spouse name: Not on file   Number of children: 2   Years of education: Not on file   Highest education level: Not on file  Occupational History   Occupation: disability due to back    Employer: UNEMPLOYED  Tobacco Use   Smoking status: Former    Packs/day: 0.50    Years: 15.00    Total pack years:  7.50    Types: Cigarettes    Quit date: 11/11/2002    Years since quitting: 19.7   Smokeless tobacco: Never  Vaping Use   Vaping Use: Never used  Substance and Sexual Activity   Alcohol use: No    Alcohol/week: 0.0 standard drinks of alcohol   Drug use: No   Sexual activity: Yes    Birth control/protection: None  Other Topics Concern   Not on file  Social History Narrative   Married   No regular exercise   Social Determinants of Health   Financial Resource Strain: Low Risk  (06/28/2020)   Overall Financial Resource Strain (CARDIA)    Difficulty of Paying Living Expenses: Not hard at all  Food Insecurity: No Food Insecurity (06/28/2020)   Hunger Vital Sign    Worried About Running Out of Food in the Last Year: Never true    Ran Out of Food in the Last  Year: Never true  Transportation Needs: No Transportation Needs (06/28/2020)   PRAPARE - Hydrologist (Medical): No    Lack of Transportation (Non-Medical): No  Physical Activity: Inactive (06/28/2020)   Exercise Vital Sign    Days of Exercise per Week: 0 days    Minutes of Exercise per Session: 0 min  Stress: No Stress Concern Present (06/28/2020)   [place]    Feeling of Stress : Not at all  Social Connections: Moderately Integrated (06/28/2020)   Social Connection and Isolation Panel [NHANES]    Frequency of Communication with Friends and Family: More than three times a week    Frequency of Social Gatherings with Friends and Family: Twice a week    Attends Religious Services: More than 4 times per year    Active Member of [employer] or Organizations: No    Attends Archivist Meetings: Never    Marital Status: Married    Family History  Problem Relation Age of Onset   Cancer Mother        lung   Hypertension Mother    Hyperlipidemia Mother    Cancer Father        prostate   Hypertension Father    Colon cancer Neg Hx    Diabetes Neg Hx     Outpatient Encounter Medications as of 08/11/2022  Medication Sig   apixaban (ELIQUIS) 5 MG TABS tablet Take 1 tablet (5 mg total) by mouth 2 (two) times daily.   B-D ULTRAFINE III SHORT PEN 31G X 8 MM MISC USE EVERY DAY   Blood Glucose Monitoring Suppl (ONE TOUCH ULTRA 2) w/Device KIT USE TO TEST BLOOD SUGAR ONCE D UTD   clotrimazole-betamethasone (LOTRISONE) cream Apply 1 application topically 2 (two) times daily as needed (leg infection).    ergocalciferol (VITAMIN D2) 1.25 MG (50000 UT) capsule Take 1 capsule (50,000 Units total) by mouth once a week.   folic acid (FOLVITE) 1 MG tablet TAKE 1 TABLET(1 MG) BY MOUTH DAILY   furosemide (LASIX) 20 MG tablet Take 1 tablet (20 mg total) by mouth daily as needed for fluid or edema.   Lancets  (ONETOUCH DELICA PLUS [id number]) MISC TEST TWICE DAILY   metFORMIN (GLUCOPHAGE) 500 MG tablet TAKE 1 TABLET(500 MG) BY MOUTH TWICE DAILY WITH A MEAL   ONETOUCH ULTRA test strip TEST TWICE DAILY   potassium chloride (KLOR-CON) 10 MEQ tablet Take 10 meq on days you need to take Lasix   rosuvastatin (CRESTOR) 40 MG  tablet Take 40 mg by mouth daily.   vitamin B-12 (CYANOCOBALAMIN) 1000 MCG tablet Take 2 tablets (2,000 mcg total) by mouth daily.   [DISCONTINUED] insulin degludec (TRESIBA FLEXTOUCH) 100 UNIT/ML FlexTouch Pen Inject 30 Units into the skin at bedtime.   insulin degludec (TRESIBA FLEXTOUCH) 100 UNIT/ML FlexTouch Pen Inject 20 Units into the skin at bedtime.   metoprolol tartrate (LOPRESSOR) 25 MG tablet Take 1 tablet (25 mg total) by mouth 2 (two) times daily.   No facility-administered encounter medications on file as of 08/11/2022.    ALLERGIES: Allergies  Allergen Reactions   Penicillins Rash    Has patient had a PCN reaction causing immediate rash, facial/tongue/throat swelling, SOB or lightheadedness with hypotension: Unknown Has patient had a PCN reaction causing severe rash involving mucus membranes or skin necrosis: Unknown Has patient had a PCN reaction that required hospitalization: Unknown Has patient had a PCN reaction occurring within the last 10 years: No If all of the above answers are "NO", then may proceed with Cephalosporin use.     VACCINATION STATUS: Immunization History  Administered Date(s) Administered   Fluad Quad(high Dose 65+) 08/04/2019, 06/28/2020   Influenza Split 06/08/2012   Influenza,inj,Quad PF,6+ Mos 07/14/2013, 06/20/2015   Influenza-Unspecified 07/15/2011, 06/02/2012   Moderna Sars-Covid-2 Vaccination 11/03/2019, 12/05/2019    Diabetes He presents for his follow-up diabetic visit. He has type 2 diabetes mellitus. Onset time: He was diagnosed in March 2021 after at least 2 years of exposure to steroids related to his B-cell lymphoma which  required autologous stem cell transplant bone marrow. His disease course has been improving. There are no hypoglycemic associated symptoms. Pertinent negatives for hypoglycemia include no confusion, headaches, pallor or seizures. Pertinent negatives for diabetes include no blurred vision, no chest pain, no fatigue, no polydipsia, no polyphagia, no polyuria and no weakness. There are no hypoglycemic complications. Symptoms are stable. Diabetic complications include nephropathy and PVD. Risk factors for coronary artery disease include diabetes mellitus, dyslipidemia, family history, male sex, obesity, tobacco exposure and sedentary lifestyle. Current diabetic treatment includes insulin injections and oral agent (monotherapy). He is compliant with treatment most of the time. His weight is decreasing steadily. He is following a generally healthy diet. When asked about meal planning, he reported none. He has not had a previous visit with a dietitian. He rarely participates in exercise. His home blood glucose trend is fluctuating minimally. His breakfast blood glucose range is generally 90-110 mg/dl. His bedtime blood glucose range is generally 110-130 mg/dl. (He presents today with his meter and logs showing at target glycemic profile overall.  His POCT A1c today is 4.9%, improving from last visit of 5.3%.  He does note his morning readings are trending downward and concerning that he is in the 80s quite frequently.  He does not have any significant hypoglycemia documented.  He will start radiation therapy for prostate cancer at the beginning of the year.) An ACE inhibitor/angiotensin II receptor blocker is not being taken. He sees a podiatrist.Eye exam is current.  Hyperlipidemia This is a chronic problem. The current episode started more than 1 year ago. The problem is controlled. Recent lipid tests were reviewed and are normal. Exacerbating diseases include chronic renal disease, diabetes and obesity. Factors  aggravating his hyperlipidemia include beta blockers and fatty foods. Pertinent negatives include no chest pain, myalgias or shortness of breath. Current antihyperlipidemic treatment includes statins. The current treatment provides moderate improvement of lipids. Compliance problems include adherence to exercise and adherence to diet.  Risk  factors for coronary artery disease include dyslipidemia, diabetes mellitus, family history, obesity, male sex, hypertension and a sedentary lifestyle.  Hypertension This is a chronic problem. The current episode started more than 1 year ago. The problem has been resolved since onset. The problem is controlled. Pertinent negatives include no blurred vision, chest pain, headaches, neck pain, palpitations or shortness of breath. There are no associated agents to hypertension. Risk factors for coronary artery disease include dyslipidemia, diabetes mellitus, male gender, obesity, sedentary lifestyle, smoking/tobacco exposure and family history. Past treatments include beta blockers and diuretics. The current treatment provides mild improvement. Compliance problems include diet and exercise.  Hypertensive end-organ damage includes kidney disease and PVD. Identifiable causes of hypertension include chronic renal disease.    Review of systems  Constitutional: + stable body weight,  current Body mass index is 34.2 kg/m. , no fatigue, no subjective hyperthermia, no subjective hypothermia Eyes: no blurry vision, no xerophthalmia ENT: no sore throat, no nodules palpated in throat, no dysphagia/odynophagia, no hoarseness Cardiovascular: no chest pain, no shortness of breath, no palpitations, + leg swelling with leaking- wears compression stockings Respiratory: no cough, no shortness of breath Gastrointestinal: no nausea/vomiting/diarrhea Musculoskeletal: no muscle/joint aches, walks with cane Skin: no rashes, no hyperemia Neurological: no tremors, no numbness, no tingling, no  dizziness Psychiatric: no depression, no anxiety  Objective:    BP (!) 162/78 (BP Location: Right Arm, Patient Position: Sitting, Cuff Size: Large) Comment: Patient states that he has not had his BP medication  Pulse 72   Ht 5' 11" (1.803 m)   Wt 245 lb 3.2 oz (111.2 kg)   BMI 34.20 kg/m   Wt Readings from Last 3 Encounters:  08/11/22 245 lb 3.2 oz (111.2 kg)  07/30/22 243 lb (110.2 kg)  07/23/22 251 lb 8 oz (114.1 kg)    BP Readings from Last 3 Encounters:  08/11/22 (!) 162/78  08/06/22 (!) 152/83  07/30/22 136/82    Physical Exam- Limited  Constitutional:  Body mass index is 34.2 kg/m. , not in acute distress, normal state of mind Eyes:  EOMI, no exophthalmos Musculoskeletal: no gross deformities, strength intact in all four extremities, no gross restriction of joint movements, walks with cane Skin:  no rashes, no hyperemia Neurological: no tremor with outstretched hands    CMP ( most recent) CMP     Component Value Date/Time   NA 141 08/06/2022 1433   NA 142 07/30/2021 0923   K 3.4 (L) 08/06/2022 1433   CL 107 08/06/2022 1433   CO2 27 08/06/2022 1433   GLUCOSE 90 08/06/2022 1433   BUN 10 08/06/2022 1433   BUN 18 07/30/2021 0923   CREATININE 1.44 (H) 08/06/2022 1433   CALCIUM 8.9 08/06/2022 1433   PROT 6.3 07/30/2021 0923   ALBUMIN 3.9 07/30/2021 0923   AST 23 07/30/2021 0923   ALT 24 07/30/2021 0923   ALKPHOS 71 07/30/2021 0923   BILITOT 0.4 07/30/2021 0923   GFRNONAA 52 (L) 08/06/2022 1433   GFRAA 73 07/02/2020 0909    Diabetic Labs (most recent): Lab Results  Component Value Date   HGBA1C 4.9 08/11/2022   HGBA1C 5.3 02/09/2022   HGBA1C 5.1 08/11/2021    Lab Results  Component Value Date   TSH 2.650 07/30/2021   TSH 2.760 07/02/2020   TSH 1.122 09/08/2017   FREET4 1.12 07/30/2021   FREET4 1.06 07/02/2020      Assessment & Plan:   1) Uncontrolled type 2 diabetes mellitus with hyperglycemia (HCC)  -  [name] has currently  uncontrolled symptomatic type 2 DM since  71 years of age.  He presents today with his meter and logs showing at target glycemic profile overall.  His POCT A1c today is 4.9%, improving from last visit of 5.3%.  He does note his morning readings are trending downward and concerning that he is in the 80s quite frequently.  He does not have any significant hypoglycemia documented.  He will start radiation therapy for prostate cancer at the beginning of the year.  - Recent labs reviewed.  - I had a long discussion with him about the progressive nature of diabetes and the pathology behind its complications. -his diabetes is complicated by obesity/sedentary life, peripheral arterial disease with venous stasis and he remains at a high risk for more acute and chronic complications which include CAD, CVA, CKD, retinopathy, and neuropathy. These are all discussed in detail with him.  - Nutritional counseling repeated at each appointment due to patients tendency to fall back in to old habits.  - The patient admits there is a room for improvement in their diet and drink choices. -  Suggestion is made for the patient to avoid simple carbohydrates from their diet including Cakes, Sweet Desserts / Pastries, Ice Cream, Soda (diet and regular), Sweet Tea, Candies, Chips, Cookies, Sweet Pastries, Store Bought Juices, Alcohol in Excess of 1-2 drinks a day, Artificial Sweeteners, Coffee Creamer, and "Sugar-free" Products. This will help patient to have stable blood glucose profile and potentially avoid unintended weight gain.   - I encouraged the patient to switch to unprocessed or minimally processed complex starch and increased protein intake (animal or plant source), fruits, and vegetables.   - Patient is advised to stick to a routine mealtimes to eat 3 meals a day and avoid unnecessary snacks (to snack only to correct hypoglycemia).  - I have approached him with the following individualized plan to manage  his  diabetes and patient agrees:   -Given his presentation with controlled glycemic profile, he will not need bolus insulin at this time.    -Given his tightening glycemic profile, will lower his Mal [place]ab[place] to 20 units SQ nightly (may be able to take him off in the future).  He can continue Metformin 500 mg po twice daily with meals (renal function stable at this time).  -He is encouraged to continue monitoring blood glucose levels twice daily, before breakfast and before bed and report blood glucose levels less than 70 or greater than 200 for 3 tests in a row. He is not interested in CGM at this time.  - he is not a candidate for TZDs, SGLT2 inhibitors due to compromised peripheral circulation.    - he will be considered for incretin therapy as appropriate next visit.  - Specific targets for  A1c;  LDL, HDL,  and Triglycerides were discussed with the patient.  2) Blood Pressure /Hypertension:  His blood pressure is not controlled to target but he has yet to take his medications this morning.  He is advised to continue Lasix 20 mg po daily as needed for fluid, and Metoprolol 25 mg po twice daily.  He is advised to adopt a low sodium diet.  3) Lipids/Hyperlipidemia:  His most recent lipid panel from 07/30/21 shows controlled LDL of 65.  He is advised to continue Crestor 40 mg po daily at bedtime.  Side effects and precautions discussed with him.    4)  Weight/Diet:  His Body mass index is 34.2 kg/m.  -  clearly complicating his diabetes care.   he is  a candidate for weight loss. I discussed with him the fact that loss of 5 - 10% of his  current body weight will have the most impact on his diabetes management.  Exercise, and detailed carbohydrates information provided  -  detailed on discharge instructions.  5) Chronic Care/Health Maintenance: -he is on Statin medications and is encouraged to initiate and continue to follow up with Ophthalmology, Dentist,  Podiatrist at least yearly or according to  recommendations, and advised to stay away from smoking. I have recommended yearly flu vaccine and pneumonia vaccine at least every 5 years; moderate intensity exercise for up to 150 minutes weekly; and  sleep for at least 7 hours a day.  - he is advised to maintain close follow up with Redmond School, MD for primary care needs, as well as his other providers for optimal and coordinated care.     I spent 30 minutes in the care of the patient today including review of labs from [place], Lipids, Thyroid Function, Hematology (current and previous including abstractions from other facilities); face-to-face time discussing  his blood glucose readings/logs, discussing hypoglycemia and hyperglycemia episodes and symptoms, medications doses, his options of short and long term treatment based on the latest standards of care / guidelines;  discussion about incorporating lifestyle medicine;  and documenting the encounter. Risk reduction counseling performed per USPSTF guidelines to reduce obesity and cardiovascular risk factors.     Please refer to Patient Instructions for Blood Glucose Monitoring and Insulin/Medications Dosing Guide"  in media tab for additional information. Please  also refer to " Patient Self Inventory" in the Media  tab for reviewed elements of pertinent patient history.  [name] participated in the discussions, expressed understanding, and voiced agreement with the above plans.  All questions were answered to his satisfaction. he is encouraged to contact clinic should he have any questions or concerns prior to his return visit.   Follow up plan: - Return in about 6 months (around 02/10/2023) for Diabetes F/U with A1c in office, No previsit labs, Bring meter and logs.    [name], [facility] [address] [place], [place] [address] Phone: [phone] Fax: [phone]  08/11/2022, 9:54 AM

## 2022-08-18 ENCOUNTER — Other Ambulatory Visit (HOSPITAL_COMMUNITY): Payer: Self-pay | Admitting: Hematology

## 2022-08-18 DIAGNOSIS — E559 Vitamin D deficiency, unspecified: Secondary | ICD-10-CM

## 2022-08-25 ENCOUNTER — Ambulatory Visit: Payer: Medicare Other | Admitting: Urology

## 2022-08-25 ENCOUNTER — Encounter: Payer: Self-pay | Admitting: Urology

## 2022-08-25 VITALS — BP 157/84 | HR 74

## 2022-08-25 DIAGNOSIS — C61 Malignant neoplasm of prostate: Secondary | ICD-10-CM | POA: Diagnosis not present

## 2022-08-25 NOTE — H&P (View-Only) (Signed)
 08/25/2022 1:31 PM   [name] 10/02/1950 [id number]  Referring provider: Fusco, Lawrence, MD [address] [place],  [place] [address]  Prostate cancer   HPI: [name] is a 71yo here for followup for prostate cancer. Hew met with Dr. Manning and has elected to proceed with IMRT without ADT. IPSS 8 QOL 1 on no BPH therapy. Is is on Eliquis. No other complaints today   PMH: 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   autologous stem cell transplant 12/2005   Obesity, morbid (more than 100 lbs over ideal weight or BMI > 40) (HCC)    Venous insufficiency 04/29/2011    Surgical History: Past Surgical History:  Procedure Laterality Date   BACK SURGERY  2006   T6 vertebrae removed/titanium placed   CATARACT EXTRACTION W/PHACO Right 03/31/2019   Procedure: CATARACT EXTRACTION PHACO AND INTRAOCULAR LENS PLACEMENT (IOC);  Surgeon: Wrzosek, James, MD;  Location: [facility];  Service: Ophthalmology;  Laterality: Right;  CDE: 3.21   CATARACT EXTRACTION W/PHACO Left 04/14/2019   Procedure: CATARACT EXTRACTION PHACO AND INTRAOCULAR LENS PLACEMENT (IOC);  Surgeon: Wrzosek, James, MD;  Location: [facility];  Service: Ophthalmology;  Laterality: Left;  left - pt knows to arrive at 7:45, CDE: 3.55   COLONOSCOPY  11/24/2004   Polyps in the left colon ablated/removed as described above.  Two submucosal lesions consistent with lipomas as described above not  manipulated./ Normal rectum   COLONOSCOPY  06/20/2012   MULTIPLE RECTAL AND COLONIC POLYPS   COLONOSCOPY N/A 03/08/2015   RMR: Capacious, redundant colon. Multiple colonic and rectosigmoid polyps removed. ablated as described above. colonic lipoma  abnormal appearing terminal ileum likely a variant of normal). Howeverwith history  biopsies obtained.    ESOPHAGOGASTRODUODENOSCOPY N/A 03/08/2015   RMR: Hiatal hernia Polypoid gastric mucosa with multiple gastric polyps. largest polyp removed via snare polypectomgy hemostasis clip placed at base. Status post gastric biopsy. Status post video capsule placement.    GIVENS CAPSULE STUDY N/A 03/08/2015   Procedure: GIVENS CAPSULE STUDY;  Surgeon: Robert M Rourk, MD;  [facility];  Service: Endoscopy;  Laterality: N/A;   LIMBAL STEM CELL TRANSPLANT     LUNG BIOPSY  6/06   MULTIPLE TOOTH EXTRACTIONS  04/2005   port a cath placement     PORT-A-CATH REMOVAL  09/08/2012   Procedure: REMOVAL PORT-A-CATH;  Surgeon: Steven C Hendrickson, MD;  [facility]: [facility];  Service: Thoracic;  Laterality: N/A;    Home Medications:  Allergies as of 08/25/2022       Reactions   Penicillins Rash   Has patient had a PCN reaction causing immediate rash, facial/tongue/throat swelling, SOB or lightheadedness with hypotension: Unknown Has patient had a PCN reaction causing severe rash involving mucus membranes or skin necrosis: Unknown Has patient had a PCN reaction that required hospitalization: Unknown Has patient had a PCN reaction occurring within the last 10 years: No If all of the above answers are "NO", then may proceed with Cephalosporin use.        Medication List        Accurate as of August 25, 2022    1:31 PM. If you have any questions, ask your nurse or doctor.          apixaban 5 MG Tabs tablet Commonly known as: Eliquis Take 1 tablet (5 mg total) by mouth 2 (two) times daily.   B-D ULTRAFINE III SHORT PEN 31G X 8 MM Misc Generic drug: Insulin Pen Needle USE EVERY DAY   clotrimazole-betamethasone cream Commonly known as: LOTRISONE Apply 1 application topically 2 (two) times daily as needed (leg infection).   cyanocobalamin 1000 MCG tablet Commonly known as: VITAMIN B12 Take 2  tablets (2,000 mcg total) by mouth daily.   folic acid 1 MG tablet Commonly known as: FOLVITE TAKE 1 TABLET(1 MG) BY MOUTH DAILY   furosemide 20 MG tablet Commonly known as: LASIX Take 1 tablet (20 mg total) by mouth daily as needed for fluid or edema.   metFORMIN 500 MG tablet Commonly known as: GLUCOPHAGE TAKE 1 TABLET(500 MG) BY MOUTH TWICE DAILY WITH A MEAL   metoprolol tartrate 25 MG tablet Commonly known as: LOPRESSOR Take 1 tablet (25 mg total) by mouth 2 (two) times daily.   ONE TOUCH ULTRA 2 w/Device Kit USE TO TEST BLOOD SUGAR ONCE D UTD   OneTouch Delica Plus [id number] Misc TEST TWICE DAILY   OneTouch Ultra test strip Generic drug: glucose blood TEST TWICE DAILY   potassium chloride 10 MEQ tablet Commonly known as: KLOR-CON Take 10 meq on days you need to take Lasix   rosuvastatin 40 MG tablet Commonly known as: CRESTOR Take 40 mg by mouth daily.   Tresiba FlexTouch 100 UNIT/ML FlexTouch Pen Generic drug: insulin degludec Inject 20 Units into the skin at bedtime.   Vitamin D (Ergocalciferol) 1.25 MG (50000 UNIT) Caps capsule Commonly known as: DRISDOL TAKE 1 CAPSULE BY MOUTH ONCE WEEKLY        Allergies:  Allergies  Allergen Reactions   Penicillins Rash    Has patient had a PCN reaction causing immediate rash, facial/tongue/throat swelling, SOB or lightheadedness with hypotension: Unknown Has patient had a PCN reaction causing severe rash involving mucus membranes or skin necrosis: Unknown Has patient had a PCN reaction that required hospitalization: Unknown Has patient had a PCN reaction occurring within the last 10 years: No If all of the above answers are "NO", then may proceed with Cephalosporin use.     Family History: Family History  Problem Relation Age of Onset   Cancer Mother        lung   Hypertension Mother    Hyperlipidemia Mother    Cancer Father        prostate   Hypertension Father    Colon cancer Neg Hx    Diabetes Neg  Hx     Social History:  reports that he quit smoking about 19 years ago. His smoking use included cigarettes. He has a 7.50 pack-year smoking history. He has never used smokeless tobacco. He reports that he does not drink alcohol and does not use drugs.  ROS: All other review of systems were reviewed and are negative except what is noted above in HPI  Physical Exam: BP (!) 157/84   Pulse 74   Constitutional:  Alert and oriented, No acute distress. HEENT: [place] AT, moist mucus membranes.  Trachea midline, no masses. Cardiovascular: No clubbing, cyanosis, or edema. Respiratory: Normal respiratory effort, no increased work of breathing. GI: Abdomen is soft, nontender, nondistended, no abdominal masses GU: No CVA tenderness.  Lymph: No cervical or inguinal lymphadenopathy. Skin: No rashes, bruises   or suspicious lesions. Neurologic: Grossly intact, no focal deficits, moving all 4 extremities. Psychiatric: Normal mood and affect.  Laboratory Data: Lab Results  Component Value Date   WBC 4.0 08/06/2022   HGB 11.7 (L) 08/06/2022   HCT 37.0 (L) 08/06/2022   MCV 98.4 08/06/2022   PLT 151 08/06/2022    Lab Results  Component Value Date   CREATININE 1.44 (H) 08/06/2022    No results found for: "PSA"  No results found for: "TESTOSTERONE"  Lab Results  Component Value Date   HGBA1C 4.9 08/11/2022    Urinalysis    Component Value Date/Time   COLORURINE AMBER (A) 08/02/2019 2010   APPEARANCEUR Clear 07/20/2022 1417   LABSPEC 1.018 08/02/2019 2010   PHURINE 5.0 08/02/2019 2010   GLUCOSEU Negative 07/20/2022 1417   HGBUR MODERATE (A) 08/02/2019 2010   BILIRUBINUR Negative 07/20/2022 1417   KETONESUR 5 (A) 08/02/2019 2010   PROTEINUR 3+ (A) 07/20/2022 1417   PROTEINUR 30 (A) 08/02/2019 2010   NITRITE Negative 07/20/2022 1417   NITRITE NEGATIVE 08/02/2019 2010   LEUKOCYTESUR Negative 07/20/2022 1417   LEUKOCYTESUR NEGATIVE 08/02/2019 2010    Lab Results  Component Value  Date   LABMICR See below: 07/20/2022   WBCUA 0-5 07/20/2022   LABEPIT 0-10 07/20/2022   BACTERIA None seen 07/20/2022    Pertinent Imaging:  No results found for this or any previous visit.  Results for orders placed during the hospital encounter of 10/24/08  US VenoUS Imaging Bilateral  Narrative Clinical Data: Elevated D-dimer  BILATERAL LOWER EXTREMITY VENOUS DUPLEX ULTRASOUND  Technique: Gray-scale sonography with graded compression, as well as color Doppler and duplex ultrasound, were performed to evaluate the deep venous system of both lower extremities from the level of the common femoral vein through the popliteal and proximal calf veins. Spectral Doppler was utilized to evaluate flow at rest and with distal augmentation maneuvers.  Comparison: None  Findings:  Normal compressibility of  bilateral common femoral, superficial femoral, and popliteal veins is demonstrated, as well as the visualized proximal calf veins.  No filling defects to suggest DVT on grayscale or color Doppler imaging.  Doppler waveforms show normal direction of venous flow, normal respiratory phasicity and response to augmentation.  IMPRESSION: No evidence of bilateral lower extremity deep vein thrombosis.  Provider: Cynthia Carpenter  No results found for this or any previous visit.  No results found for this or any previous visit.  Results for orders placed during the hospital encounter of 05/18/17  US RENAL  Narrative CLINICAL DATA:  Acute kidney injury. Admitted for pneumonia. History of hypertension, lymphoma, diabetes.  EXAM: RENAL / URINARY TRACT ULTRASOUND COMPLETE  COMPARISON:  CT chest, abdomen and pelvis May 18, 2017  FINDINGS: Right Kidney:  Length: 12.8 cm. Echogenicity within normal limits. No mass or hydronephrosis visualized.  Left Kidney:  Length: 11 cm. Echogenicity within normal limits. No mass or hydronephrosis  visualized.  Bladder:  Decompressed.  Incidental LEFT pleural effusion as seen on yesterday's CT chest.  IMPRESSION: Normal renal ultrasound.   Electronically Signed By: Courtnay  Bloomer M.D. On: 05/19/2017 13:46  No valid procedures specified. No results found for this or any previous visit.  No results found for this or any previous visit.   Assessment & Plan:    1. Prostate cancer (HCC); PSA 5.3; Gleason grade group 1, 2, 3; unfavorable intermediate risk; T1cNx Mx I discussed the natural history of unfavorable intermediate risk prostate cancer with the patient and the various treatment   options including active surveillance, RALP, IMRT, brachytherapy, cryotherapy, HIFU and ADT. After discussing the options the patient elects for IMRt. We will schedule for fiducial markers with SPaceOAR. Risks/benefits/alternatives discussed - Urinalysis, Routine w reflex microscopic   No follow-ups on file.  Jamoni Broadfoot, MD  [facility] [place]   

## 2022-08-25 NOTE — Patient Instructions (Signed)

## 2022-08-25 NOTE — Progress Notes (Signed)
08/25/2022 1:31 PM   [name] [birth year] [id number]  Referring provider: Redmond School, MD [address] [place],  [place] [address]  Prostate cancer   HPI: [name] is a 72yo here for followup for prostate cancer. Hew met with Dr. Tammi Klippel and has elected to proceed with IMRT without ADT. IPSS 8 QOL 1 on no BPH therapy. Is is on Eliquis. No other complaints today   PMH: Past Medical History:  Diagnosis Date   Asthma    as child   Diabetes mellitus without complication ([place])    DLBCL (diffuse large B cell lymphoma) ([place]) 02/28/2009   Edema    Heart failure, diastolic, chronic ([place])    patient denies   Hyperlipidemia, mixed    Hypertension    IDA (iron deficiency anemia)    Large cell lymphoma ([place]) 01/2005   autologous stem cell transplant 12/2005   Obesity, morbid (more than 100 lbs over ideal weight or BMI > 40) ([place])    Venous insufficiency 04/29/2011    Surgical History: Past Surgical History:  Procedure Laterality Date   BACK SURGERY  2006   T6 vertebrae removed/titanium placed   CATARACT EXTRACTION W/PHACO Right 03/31/2019   Procedure: CATARACT EXTRACTION PHACO AND INTRAOCULAR LENS PLACEMENT ([place]);  Surgeon: Baruch Goldmann, MD;  Location: [facility];  Service: Ophthalmology;  Laterality: Right;  CDE: 3.21   CATARACT EXTRACTION W/PHACO Left 04/14/2019   Procedure: CATARACT EXTRACTION PHACO AND INTRAOCULAR LENS PLACEMENT (IOC);  Surgeon: Baruch Goldmann, MD;  Location: [facility];  Service: Ophthalmology;  Laterality: Left;  left - pt knows to arrive at 7:45, CDE: 3.55   COLONOSCOPY  11/24/2004   Polyps in the left colon ablated/removed as described above.  Two submucosal lesions consistent with lipomas as described above not  manipulated./ Normal rectum   COLONOSCOPY  06/20/2012   MULTIPLE RECTAL AND COLONIC POLYPS   COLONOSCOPY N/A 03/08/2015   RMR: Capacious, redundant colon. Multiple colonic and rectosigmoid polyps removed. ablated as described above. colonic lipoma  abnormal appearing terminal ileum likely a variant of normal). Howeverwith history  biopsies obtained.    ESOPHAGOGASTRODUODENOSCOPY N/A 03/08/2015   RMR: Hiatal hernia Polypoid gastric mucosa with multiple gastric polyps. largest polyp removed via snare polypectomgy hemostasis clip placed at base. Status post gastric biopsy. Status post video capsule placement.    GIVENS CAPSULE STUDY N/A 03/08/2015   Procedure: GIVENS CAPSULE STUDY;  Surgeon: Daneil Dolin, MD;  [facility];  Service: Endoscopy;  Laterality: N/A;   LIMBAL STEM CELL TRANSPLANT     LUNG BIOPSY  6/06   MULTIPLE TOOTH EXTRACTIONS  04/2005   port a cath placement     PORT-A-CATH REMOVAL  09/08/2012   Procedure: REMOVAL PORT-A-CATH;  Surgeon: Melrose Nakayama, MD;  Location: [facility];  Service: Thoracic;  Laterality: N/A;    Home Medications:  Allergies as of 08/25/2022       Reactions   Penicillins Rash   Has patient had a PCN reaction causing immediate rash, facial/tongue/throat swelling, SOB or lightheadedness with hypotension: Unknown Has patient had a PCN reaction causing severe rash involving mucus membranes or skin necrosis: Unknown Has patient had a PCN reaction that required hospitalization: Unknown Has patient had a PCN reaction occurring within the last 10 years: No If all of the above answers are "NO", then may proceed with Cephalosporin use.        Medication List        Accurate as of August 25, 2022  1:31 PM. If you have any questions, ask your nurse or doctor.          apixaban 5 MG Tabs tablet Commonly known as: Eliquis Take 1 tablet (5 mg total) by mouth 2 (two) times daily.   B-D ULTRAFINE III SHORT PEN 31G X 8 MM Misc Generic drug: Insulin Pen Needle USE EVERY DAY   clotrimazole-betamethasone cream Commonly known as: LOTRISONE Apply 1 application topically 2 (two) times daily as needed (leg infection).   cyanocobalamin 1000 MCG tablet Commonly known as: VITAMIN B12 Take 2  tablets (2,000 mcg total) by mouth daily.   folic acid 1 MG tablet Commonly known as: FOLVITE TAKE 1 TABLET(1 MG) BY MOUTH DAILY   furosemide 20 MG tablet Commonly known as: LASIX Take 1 tablet (20 mg total) by mouth daily as needed for fluid or edema.   metFORMIN 500 MG tablet Commonly known as: GLUCOPHAGE TAKE 1 TABLET(500 MG) BY MOUTH TWICE DAILY WITH A MEAL   metoprolol tartrate 25 MG tablet Commonly known as: LOPRESSOR Take 1 tablet (25 mg total) by mouth 2 (two) times daily.   ONE TOUCH ULTRA 2 w/Device Kit USE TO TEST BLOOD SUGAR ONCE D UTD   OneTouch Delica Plus [id number] Misc TEST TWICE DAILY   OneTouch Ultra test strip Generic drug: glucose blood TEST TWICE DAILY   potassium chloride 10 MEQ tablet Commonly known as: KLOR-CON Take 10 meq on days you need to take Lasix   rosuvastatin 40 MG tablet Commonly known as: CRESTOR Take 40 mg by mouth daily.   Tyler Aas FlexTouch 100 UNIT/ML FlexTouch Pen Generic drug: insulin degludec Inject 20 Units into the skin at bedtime.   Vitamin D (Ergocalciferol) 1.25 MG (50000 UNIT) Caps capsule Commonly known as: DRISDOL TAKE 1 CAPSULE BY MOUTH ONCE WEEKLY        Allergies:  Allergies  Allergen Reactions   Penicillins Rash    Has patient had a PCN reaction causing immediate rash, facial/tongue/throat swelling, SOB or lightheadedness with hypotension: Unknown Has patient had a PCN reaction causing severe rash involving mucus membranes or skin necrosis: Unknown Has patient had a PCN reaction that required hospitalization: Unknown Has patient had a PCN reaction occurring within the last 10 years: No If all of the above answers are "NO", then may proceed with Cephalosporin use.     Family History: Family History  Problem Relation Age of Onset   Cancer Mother        lung   Hypertension Mother    Hyperlipidemia Mother    Cancer Father        prostate   Hypertension Father    Colon cancer Neg Hx    Diabetes Neg  Hx     Social History:  reports that he quit smoking about 19 years ago. His smoking use included cigarettes. He has a 7.50 pack-year smoking history. He has never used smokeless tobacco. He reports that he does not drink alcohol and does not use drugs.  ROS: All other review of systems were reviewed and are negative except what is noted above in HPI  Physical Exam: BP (!) 157/84   Pulse 74   Constitutional:  Alert and oriented, No acute distress. HEENT: [place] AT, moist mucus membranes.  Trachea midline, no masses. Cardiovascular: No clubbing, cyanosis, or edema. Respiratory: Normal respiratory effort, no increased work of breathing. GI: Abdomen is soft, nontender, nondistended, no abdominal masses GU: No CVA tenderness.  Lymph: No cervical or inguinal lymphadenopathy. Skin: No rashes, bruises  or suspicious lesions. Neurologic: Grossly intact, no focal deficits, moving all 4 extremities. Psychiatric: Normal mood and affect.  Laboratory Data: Lab Results  Component Value Date   WBC 4.0 08/06/2022   HGB 11.7 (L) 08/06/2022   HCT 37.0 (L) 08/06/2022   MCV 98.4 08/06/2022   PLT 151 08/06/2022    Lab Results  Component Value Date   CREATININE 1.44 (H) 08/06/2022    No results found for: "PSA"  No results found for: "TESTOSTERONE"  Lab Results  Component Value Date   HGBA1C 4.9 08/11/2022    Urinalysis    Component Value Date/Time   COLORURINE AMBER (A) 08/02/2019 2010   APPEARANCEUR Clear 07/20/2022 1417   LABSPEC 1.018 08/02/2019 2010   PHURINE 5.0 08/02/2019 2010   GLUCOSEU Negative 07/20/2022 1417   HGBUR MODERATE (A) 08/02/2019 2010   BILIRUBINUR Negative 07/20/2022 1417   KETONESUR 5 (A) 08/02/2019 2010   PROTEINUR 3+ (A) 07/20/2022 1417   PROTEINUR 30 (A) 08/02/2019 2010   NITRITE Negative 07/20/2022 1417   NITRITE NEGATIVE 08/02/2019 2010   LEUKOCYTESUR Negative 07/20/2022 1417   LEUKOCYTESUR NEGATIVE 08/02/2019 2010    Lab Results  Component Value  Date   LABMICR See below: 07/20/2022   WBCUA 0-5 07/20/2022   LABEPIT 0-10 07/20/2022   BACTERIA None seen 07/20/2022    Pertinent Imaging:  No results found for this or any previous visit.  Results for orders placed during the hospital encounter of 10/24/08  US VenoUS Imaging Bilateral  Narrative Clinical Data: Elevated D-dimer  BILATERAL LOWER EXTREMITY VENOUS DUPLEX ULTRASOUND  Technique: Gray-scale sonography with graded compression, as well as color Doppler and duplex ultrasound, were performed to evaluate the deep venous system of both lower extremities from the level of the common femoral vein through the popliteal and proximal calf veins. Spectral Doppler was utilized to evaluate flow at rest and with distal augmentation maneuvers.  Comparison: None  Findings:  Normal compressibility of  bilateral common femoral, superficial femoral, and popliteal veins is demonstrated, as well as the visualized proximal calf veins.  No filling defects to suggest DVT on grayscale or color Doppler imaging.  Doppler waveforms show normal direction of venous flow, normal respiratory phasicity and response to augmentation.  IMPRESSION: No evidence of bilateral lower extremity deep vein thrombosis.  Provider: Eldridge Scot  No results found for this or any previous visit.  No results found for this or any previous visit.  Results for orders placed during the hospital encounter of 05/18/17  US RENAL  Narrative CLINICAL DATA:  Acute kidney injury. Admitted for pneumonia. History of hypertension, lymphoma, diabetes.  EXAM: RENAL / URINARY TRACT ULTRASOUND COMPLETE  COMPARISON:  CT chest, abdomen and pelvis May 18, 2017  FINDINGS: Right Kidney:  Length: 12.8 cm. Echogenicity within normal limits. No mass or hydronephrosis visualized.  Left Kidney:  Length: 11 cm. Echogenicity within normal limits. No mass or hydronephrosis  visualized.  Bladder:  Decompressed.  Incidental LEFT pleural effusion as seen on yesterday's CT chest.  IMPRESSION: Normal renal ultrasound.   Electronically Signed By: Elon Alas M.D. On: 05/19/2017 13:46  No valid procedures specified. No results found for this or any previous visit.  No results found for this or any previous visit.   Assessment & Plan:    1. Prostate cancer ([place]); PSA 5.3; Gleason grade group 1, 2, 3; unfavorable intermediate risk; T1cNx Mx I discussed the natural history of unfavorable intermediate risk prostate cancer with the patient and the various treatment  options including active surveillance, RALP, IMRT, brachytherapy, cryotherapy, HIFU and ADT. After discussing the options the patient elects for IMRt. We will schedule for fiducial markers with SPaceOAR. Risks/benefits/alternatives discussed - Urinalysis, Routine w reflex microscopic   No follow-ups on file.  Nicolette Bang, MD  [facility] [place]

## 2022-08-26 ENCOUNTER — Telehealth: Payer: Self-pay

## 2022-08-26 ENCOUNTER — Telehealth: Payer: Self-pay | Admitting: *Deleted

## 2022-08-26 DIAGNOSIS — I700 Atherosclerosis of aorta: Secondary | ICD-10-CM

## 2022-08-26 NOTE — Telephone Encounter (Signed)
   Patient Name: [name]  DOB: August 13, 1951 MRN: [id number]  Primary Cardiologist: Carlyle Dolly, MD  Chart reviewed as part of pre-operative protocol coverage.   Pharmacy team please advise on hold time for patient's upcoming urologic procedure for Fiducial Markers with Space Oar.  Thank you  Mable Fill, Marissa Nestle, NP 08/26/2022, 12:06 PM

## 2022-08-26 NOTE — Telephone Encounter (Signed)
   Pre-operative Risk Assessment    Patient Name: [name]  DOB: 26-Oct-1950 MRN: [id number]      Request for Surgical Clearance    Procedure:   FIDUCIAL MARKERS WITH SPACE OAR  Date of Surgery:  Clearance TBD                                 Surgeon:  DR. PATRICK MCKENZIE Surgeon's Group or Practice Name:  [facility] Phone number:  [phone] Fax number:  [phone]   Type of Clearance Requested:   - Pharmacy:  Hold Apixaban (Eliquis) X'S 2 DAYS   Type of Anesthesia:  General    Additional requests/questions:    Signed, Jeanann Lewandowsky   08/26/2022, 12:01 PM

## 2022-08-26 NOTE — Telephone Encounter (Signed)
I spoke with [name]. We have discussed possible surgery dates and future dates were agreed upon by all parties. Patient given information about surgery date, what to expect pre-operatively and post operatively.  We are awaiting cardiology clearance for eliquis hold- once obtained we will schedule.    We discussed that a pre-op nurse will be calling to set up the pre-op visit that will take place prior to surgery. Informed patient that our office will communicate any additional care to be provided after surgery.    Patients questions or concerns were discussed during our call. Advised to call our office should there be any additional information, questions or concerns that arise. Patient verbalized understanding.

## 2022-08-27 ENCOUNTER — Telehealth: Payer: Self-pay | Admitting: *Deleted

## 2022-08-27 ENCOUNTER — Other Ambulatory Visit: Payer: Self-pay

## 2022-08-27 DIAGNOSIS — C61 Malignant neoplasm of prostate: Secondary | ICD-10-CM

## 2022-08-27 DIAGNOSIS — I700 Atherosclerosis of aorta: Secondary | ICD-10-CM | POA: Insufficient documentation

## 2022-08-27 NOTE — Telephone Encounter (Signed)
CALLED PATIENT TO INFORM OF FID. MARKER AND SPACE OAR PLACEMENT ON 09-01-22 AND HIS SIM APPT. ON 09-03-22- ARRIVAL TIME- 7:45 AM @ [facility], INFORMED PATIENT TO ARRIVE WITH A FULL BLADDER, SPOKE WITH PATIENT AND HE IS AWARE OF THESE APPTS. AND THE INSTRUCTIONS

## 2022-08-27 NOTE — Telephone Encounter (Signed)
Patient with diagnosis of aflutter on Eliquis for anticoagulation.    Procedure: FIDUCIAL MARKERS WITH SPACE OAR  Date of procedure: TBD  CHA2DS2-VASc Score = 5  This indicates a 7.2% annual risk of stroke. The patient's score is based upon: CHF History: 1 HTN History: 1 Diabetes History: 1 Stroke History: 0 Vascular Disease History: 1 Age Score: 1 Gender Score: 0   CrCl 9mL/min using adjusted body weight Platelet count 151K  Per office protocol, patient can hold Eliquis for 2 days prior to procedure as requested.  .  **This guidance is not considered finalized until pre-operative APP has relayed final recommendations.**

## 2022-08-27 NOTE — Telephone Encounter (Signed)
I spoke with [name]. We have discussed possible surgery dates and 09/01/2022 was agreed upon by all parties. Patient given information about surgery date, what to expect pre-operatively and post operatively.    We discussed that a pre-op nurse will be calling to set up the pre-op visit that will take place prior to surgery. Informed patient that our office will communicate any additional care to be provided after surgery.    Patients questions or concerns were discussed during our call. Advised to call our office should there be any additional information, questions or concerns that arise. Patient verbalized understanding.    [name] understands he will hold his Eliquis 2 days prior to his surgery date starting on Sunday 08/30/2022.  [facility] insurance pending at present time.

## 2022-08-27 NOTE — Telephone Encounter (Signed)
   Name: [name]  DOB: [birth year]  MRN: [id number]   Primary Cardiologist: Carlyle Dolly, MD  Chart reviewed as part of pre-operative protocol coverage.   We have been contacted for guidance to hold eliquis for fiducial marker placement and spaceOAR.   Per our pharmD: Per office protocol, patient can hold Eliquis for 2 days prior to procedure as requested.   I will route this recommendation to the requesting party via Epic fax function and remove from pre-op pool. Please call with questions.  Tami Lin Javeion Cannedy, PA 08/27/2022, 7:51 AM

## 2022-08-28 ENCOUNTER — Telehealth: Payer: Self-pay | Admitting: *Deleted

## 2022-08-28 ENCOUNTER — Ambulatory Visit: Payer: Medicare Other | Admitting: Radiation Oncology

## 2022-08-28 NOTE — Telephone Encounter (Signed)
OR scheduling conflict for 54/49/2010 New OR case date for 09/08/2022- confirmed with patient.  Patient understands Eliquis hold 2 days prior to scheduled 09/08/2022 appt.

## 2022-08-28 NOTE — Telephone Encounter (Signed)
Called patient to inform that fid. markers and space oar have been moved to 09-08-22 and his sim has been moved to 09-10-22 - arrival time- 12:45 pm @ [place], informed patient to arrive with a full bladder, spoke with patient and he is aware of these appts. and the instructions

## 2022-09-03 ENCOUNTER — Ambulatory Visit: Payer: Medicare Other | Admitting: Radiation Oncology

## 2022-09-04 ENCOUNTER — Encounter (HOSPITAL_COMMUNITY)
Admission: RE | Admit: 2022-09-04 | Discharge: 2022-09-04 | Disposition: A | Discharge status: Home / Self Care | Payer: Medicare Other | Source: Ambulatory Visit | Attending: Urology | Admitting: Urology

## 2022-09-04 ENCOUNTER — Other Ambulatory Visit (HOSPITAL_COMMUNITY): Payer: Medicare Other

## 2022-09-07 ENCOUNTER — Other Ambulatory Visit: Payer: Self-pay | Admitting: Urology

## 2022-09-07 DIAGNOSIS — C61 Malignant neoplasm of prostate: Secondary | ICD-10-CM

## 2022-09-08 ENCOUNTER — Encounter (HOSPITAL_COMMUNITY): Payer: Self-pay | Admitting: Urology

## 2022-09-08 ENCOUNTER — Encounter (HOSPITAL_COMMUNITY)
Admission: RE | Disposition: A | Discharge status: Home / Self Care | Payer: Self-pay | Source: Home / Self Care | Attending: Urology

## 2022-09-08 ENCOUNTER — Ambulatory Visit (HOSPITAL_COMMUNITY)
Admission: RE | Admit: 2022-09-08 | Discharge: 2022-09-08 | Disposition: A | Discharge status: Home / Self Care | Payer: Medicare Other | Source: Ambulatory Visit | Attending: Urology | Admitting: Urology

## 2022-09-08 ENCOUNTER — Ambulatory Visit (HOSPITAL_COMMUNITY): Payer: Medicare Other | Admitting: Anesthesiology

## 2022-09-08 ENCOUNTER — Ambulatory Visit (HOSPITAL_COMMUNITY)
Admission: RE | Admit: 2022-09-08 | Discharge: 2022-09-08 | Disposition: A | Discharge status: Home / Self Care | Payer: Medicare Other | Attending: Urology | Admitting: Urology

## 2022-09-08 ENCOUNTER — Ambulatory Visit (HOSPITAL_BASED_OUTPATIENT_CLINIC_OR_DEPARTMENT_OTHER): Payer: Medicare Other | Admitting: Anesthesiology

## 2022-09-08 DIAGNOSIS — J45909 Unspecified asthma, uncomplicated: Secondary | ICD-10-CM | POA: Diagnosis not present

## 2022-09-08 DIAGNOSIS — I10 Essential (primary) hypertension: Secondary | ICD-10-CM | POA: Diagnosis not present

## 2022-09-08 DIAGNOSIS — E119 Type 2 diabetes mellitus without complications: Secondary | ICD-10-CM | POA: Insufficient documentation

## 2022-09-08 DIAGNOSIS — C61 Malignant neoplasm of prostate: Secondary | ICD-10-CM

## 2022-09-08 DIAGNOSIS — E1165 Type 2 diabetes mellitus with hyperglycemia: Secondary | ICD-10-CM

## 2022-09-08 DIAGNOSIS — Z87891 Personal history of nicotine dependence: Secondary | ICD-10-CM

## 2022-09-08 DIAGNOSIS — Z6834 Body mass index (BMI) 34.0-34.9, adult: Secondary | ICD-10-CM | POA: Insufficient documentation

## 2022-09-08 DIAGNOSIS — Z7901 Long term (current) use of anticoagulants: Secondary | ICD-10-CM | POA: Insufficient documentation

## 2022-09-08 DIAGNOSIS — Z794 Long term (current) use of insulin: Secondary | ICD-10-CM | POA: Diagnosis not present

## 2022-09-08 DIAGNOSIS — Z7984 Long term (current) use of oral hypoglycemic drugs: Secondary | ICD-10-CM | POA: Diagnosis not present

## 2022-09-08 HISTORY — PX: SPACE OAR INSTILLATION: SHX6769

## 2022-09-08 HISTORY — PX: GOLD SEED IMPLANT: SHX6343

## 2022-09-08 LAB — GLUCOSE, CAPILLARY
Glucose-Capillary: 84 mg/dL (ref 70–99)
Glucose-Capillary: 60 mg/dL — ABNORMAL LOW (ref 70–99)
Glucose-Capillary: 65 mg/dL — ABNORMAL LOW (ref 70–99)
Glucose-Capillary: 109 mg/dL — ABNORMAL HIGH (ref 70–99)

## 2022-09-08 SURGERY — INSERTION, GOLD SEEDS
Anesthesia: General | Site: Prostate

## 2022-09-08 MED ORDER — SODIUM CHLORIDE (PF) 0.9 % IJ SOLN
INTRAMUSCULAR | Status: AC
  Filled 2022-09-08: qty 20

## 2022-09-08 MED ORDER — FENTANYL CITRATE (PF) 100 MCG/2ML IJ SOLN
INTRAMUSCULAR | Status: DC | PRN
  Administered 2022-09-08: 50 ug via INTRAVENOUS

## 2022-09-08 MED ORDER — STERILE WATER FOR IRRIGATION IR SOLN
Status: DC | PRN
  Administered 2022-09-08: 500 mL

## 2022-09-08 MED ORDER — SODIUM CHLORIDE (PF) 0.9 % IJ SOLN
INTRAMUSCULAR | Status: DC | PRN
  Administered 2022-09-08: 10 mL

## 2022-09-08 MED ORDER — CHLORHEXIDINE GLUCONATE 0.12 % MT SOLN
15.0000 mL | Freq: Once | OROMUCOSAL | Status: AC
  Administered 2022-09-08: 15 mL via OROMUCOSAL

## 2022-09-08 MED ORDER — DEXTROSE 50 % IV SOLN
INTRAVENOUS | Status: AC
  Filled 2022-09-08: qty 50

## 2022-09-08 MED ORDER — ORAL CARE MOUTH RINSE: 15.0000 mL | Freq: Once | OROMUCOSAL | Status: AC

## 2022-09-08 MED ORDER — DEXTROSE 50 % IV SOLN
0.5000 | Freq: Once | INTRAVENOUS | Status: AC
  Administered 2022-09-08: 25 mL via INTRAVENOUS

## 2022-09-08 MED ORDER — PROPOFOL 10 MG/ML IV BOLUS
INTRAVENOUS | Status: DC | PRN
  Administered 2022-09-08: 170 mg via INTRAVENOUS

## 2022-09-08 MED ORDER — FENTANYL CITRATE PF 50 MCG/ML IJ SOSY: 25.0000 ug | PREFILLED_SYRINGE | INTRAMUSCULAR | Status: DC | PRN

## 2022-09-08 MED ORDER — OXYCODONE HCL 5 MG PO TABS: 5.0000 mg | ORAL_TABLET | Freq: Once | ORAL | Status: DC | PRN

## 2022-09-08 MED ORDER — LIDOCAINE HCL (CARDIAC) PF 50 MG/5ML IV SOSY
PREFILLED_SYRINGE | INTRAVENOUS | Status: DC | PRN
  Administered 2022-09-08: 100 mg via INTRAVENOUS

## 2022-09-08 MED ORDER — TRAMADOL HCL 50 MG PO TABS: 50.0000 mg | ORAL_TABLET | Freq: Four times a day (QID) | ORAL | 0 refills | Status: DC | PRN

## 2022-09-08 MED ORDER — OXYCODONE HCL 5 MG/5ML PO SOLN: 5.0000 mg | Freq: Once | ORAL | Status: DC | PRN

## 2022-09-08 MED ORDER — ONDANSETRON HCL 4 MG/2ML IJ SOLN: 4.0000 mg | Freq: Once | INTRAMUSCULAR | Status: DC | PRN

## 2022-09-08 MED ORDER — LACTATED RINGERS IV SOLN: INTRAVENOUS | Status: DC

## 2022-09-08 MED ORDER — CEFAZOLIN SODIUM-DEXTROSE 2-4 GM/100ML-% IV SOLN
2.0000 g | INTRAVENOUS | Status: AC
  Administered 2022-09-08: 2 g via INTRAVENOUS
  Filled 2022-09-08: qty 100

## 2022-09-08 SURGICAL SUPPLY — 21 items
COVER BACK TABLE 60X90IN (DRAPES) ×2 IMPLANT
DRAPE EENT ADH APERT 31X51 STR (DRAPES) ×2 IMPLANT
DRAPE LEGGINS SURG 28X43 STRL (DRAPES) ×2 IMPLANT
DRAPE U-SHAPE 47X51 STRL (DRAPES) ×2 IMPLANT
DRSG TEGADERM 8X12 (GAUZE/BANDAGES/DRESSINGS) ×2 IMPLANT
GAUZE SPONGE 4X4 12PLY STRL (GAUZE/BANDAGES/DRESSINGS) ×4 IMPLANT
GLOVE BIO SURGEON STRL SZ8 (GLOVE) ×2 IMPLANT
GLOVE BIOGEL PI IND STRL 7.0 (GLOVE) ×4 IMPLANT
GLOVE BIOGEL PI IND STRL 8 (GLOVE) ×2 IMPLANT
GOWN STRL REUS W/TWL LRG LVL3 (GOWN DISPOSABLE) ×2 IMPLANT
IMPL SPACEOAR SYSTEM 10ML (Spacer) ×2 IMPLANT
IMPLANT SPACEOAR SYSTEM 10ML (Spacer) ×2 IMPLANT
KIT TURNOVER CYSTO (KITS) ×2 IMPLANT
MARKER GOLD PRELOAD 1.2X3 (Urological Implant) ×2 IMPLANT
PAD ARMBOARD 7.5X6 YLW CONV (MISCELLANEOUS) ×4 IMPLANT
SEED GOLD PRELOAD 1.2X3 (Urological Implant) ×2 IMPLANT
SOL PREP POV-IOD 4OZ 10% (MISCELLANEOUS) ×2 IMPLANT
SYR 10ML LL (SYRINGE) ×2 IMPLANT
TOWEL NATURAL 4PK STERILE (DISPOSABLE) ×2 IMPLANT
UNDERPAD 30X36 HEAVY ABSORB (UNDERPADS AND DIAPERS) ×2 IMPLANT
WATER STERILE IRR 500ML POUR (IV SOLUTION) ×2 IMPLANT

## 2022-09-08 NOTE — Transfer of Care (Signed)
Immediate Anesthesia Transfer of Care Note  Patient: [name]  Procedure(s) Performed: GOLD SEED IMPLANT (Prostate) SPACE OAR INSTILLATION (Perineum)  Patient Location: PACU  Anesthesia Type:General  Level of Consciousness: awake  Airway & Oxygen Therapy: Patient Spontanous Breathing  Post-op Assessment: Report given to RN  Post vital signs: Reviewed and stable  Last Vitals:  Vitals Value Taken Time  BP 139/59 09/08/22 1130  Temp    Pulse 99 09/08/22 1134  Resp 16 09/08/22 1134  SpO2 97 % 09/08/22 1134  Vitals shown include unvalidated device data.  Last Pain:  Vitals:   09/08/22 0904  TempSrc: Oral  PainSc: 0-No pain      Patients Stated Pain Goal: 5 (09/08/22 0904)  Complications: No notable events documented.

## 2022-09-08 NOTE — Anesthesia Preprocedure Evaluation (Signed)
Anesthesia Evaluation  Patient identified by MRN, date of birth, ID band Patient awake    Reviewed: Allergy & Precautions, H&P , NPO status , Patient's Chart, lab work & pertinent test results, reviewed documented beta blocker date and time   Airway Mallampati: II  TM Distance: >3 FB Neck ROM: full    Dental no notable dental hx.    Pulmonary neg pulmonary ROS, asthma , pneumonia, former smoker   Pulmonary exam normal breath sounds clear to auscultation       Cardiovascular Exercise Tolerance: Good hypertension, negative cardio ROS  Rhythm:regular Rate:Normal     Neuro/Psych negative neurological ROS  negative psych ROS   GI/Hepatic negative GI ROS, Neg liver ROS,,,  Endo/Other  diabetes, Type 2  Morbid obesity  Renal/GU Renal diseasenegative Renal ROS  negative genitourinary   Musculoskeletal   Abdominal   Peds  Hematology negative hematology ROS (+) Blood dyscrasia, anemia   Anesthesia Other Findings   Reproductive/Obstetrics negative OB ROS                             Anesthesia Physical Anesthesia Plan  ASA: 3  Anesthesia Plan: General and General LMA   Post-op Pain Management:    Induction:   PONV Risk Score and Plan: Ondansetron  Airway Management Planned:   Additional Equipment:   Intra-op Plan:   Post-operative Plan:   Informed Consent: I have reviewed the patients History and Physical, chart, labs and discussed the procedure including the risks, benefits and alternatives for the proposed anesthesia with the patient or authorized representative who has indicated his/her understanding and acceptance.     Dental Advisory Given  Plan Discussed with: CRNA  Anesthesia Plan Comments:        Anesthesia Quick Evaluation

## 2022-09-08 NOTE — Interval H&P Note (Signed)
History and Physical Interval Note:  09/08/2022 10:17 AM  [name]  has presented today for surgery, with the diagnosis of prostate cancer.  The various methods of treatment have been discussed with the patient and family. After consideration of risks, benefits and other options for treatment, the patient has consented to  Procedure(s): GOLD SEED IMPLANT (N/A) SPACE OAR INSTILLATION (N/A) as a surgical intervention.  The patient's history has been reviewed, patient examined, no change in status, stable for surgery.  I have reviewed the patient's chart and labs.  Questions were answered to the patient's satisfaction.     Wilkie Aye

## 2022-09-08 NOTE — Op Note (Signed)
PRE-OPERATIVE DIAGNOSIS:  Adenocarcinoma of the prostate  POST-OPERATIVE DIAGNOSIS:  Same  PROCEDURE: 1. Prostate Ultrasound 2. Placement of fiducial marks 3. Placement of SpaceOAR  SURGEON:  Surgeon(s): Wilkie Aye, MD  ANESTHESIA:  General  EBL:  Minimal  DRAINS: none  FINDINGS:  15.3cc gland on [place]. No hyperechoic or anechoic areas oin the prostate  INDICATION: [name] is a 72 year old with a history of T1c prostate cancer who is scheduled to undergo IMRT. He wishes to have fiducial markers and SpaceOAR placed prior to IMRT to decrease rectal toxicity.  Description of procedure: After informed consent the patient was brought to the major OR, placed on the table and administered general anesthesia. He was then moved to the modified lithotomy position with his perineum perpendicular to the floor. His perineum and genitalia were then sterilely prepped. An official timeout was then performed. The transrectal ultrasound probe was placed in the rectum and affixed to the stand. He was then sterilely draped.  A transrectal ultrasound of the prostate was performed.  Lidocaine  was not  instilled using ultrasound guidance into the junction of each seminal vesicle of the prostate.  3 Gold markers were placed into the prostate using the standard template and ultrasound guidance.  Accurate placement of the markers was confirmed.  We then proceeded to mix the SpaceOAR using the kit supplied from the manufacturer. Once this was complete we placed a sinal needle into the perirectal fat between the rectum and the prostate. Once this was accomplished we injected 2cc of normal saline to hydrodissect the plain. We then instilled the the SpaceOAR through the spinal needle and noted good distribution in the perirectal fat.   The patient was awakened and taken to recovery room in stable and satisfactory condition. He tolerated procedure well and there were no intraoperative complications.  CONDITION:  Stable, extubated, transferred to PACU  PLAN: The patient is to be discharged home and he will start IMRT in the next 2-3 weeks

## 2022-09-08 NOTE — Anesthesia Procedure Notes (Signed)
Procedure Name: LMA Insertion Date/Time: 09/08/2022 10:50 AM  Performed by: Moshe Salisbury, CRNAPre-anesthesia Checklist: Patient identified, Patient being monitored, Emergency Drugs available, Timeout performed and Suction available Patient Re-evaluated:Patient Re-evaluated prior to induction Oxygen Delivery Method: Circle System Utilized Preoxygenation: Pre-oxygenation with 100% oxygen Induction Type: IV induction Ventilation: Mask ventilation without difficulty LMA: LMA inserted LMA Size: 5.0 Number of attempts: 1 Placement Confirmation: positive ETCO2 and breath sounds checked- equal and bilateral

## 2022-09-09 ENCOUNTER — Telehealth: Payer: Self-pay | Admitting: *Deleted

## 2022-09-09 NOTE — Progress Notes (Signed)
  Radiation Oncology         ([phone] ________________________________  Name: [name] MRN: [id number]  Date: 09/10/2022  DOB: May 23, 1951  SIMULATION AND TREATMENT PLANNING NOTE    ICD-10-CM   1. Malignant neoplasm of prostate (HCC)  C61       DIAGNOSIS:  72 y.o. gentleman with Stage T1c adenocarcinoma of the prostate with Gleason score of 4+3, and PSA of 5.3.   NARRATIVE:  The patient was brought to the CT Simulation planning suite.  Identity was confirmed.  All relevant records and images related to the planned course of therapy were reviewed.  The patient freely provided informed written consent to proceed with treatment after reviewing the details related to the planned course of therapy. The consent form was witnessed and verified by the simulation staff.  Then, the patient was set-up in a stable reproducible supine position for radiation therapy.  A vacuum lock pillow device was custom fabricated to position his legs in a reproducible immobilized position.  Then, I performed a urethrogram under sterile conditions to identify the prostatic apex.  CT images were obtained.  Surface markings were placed.  The CT images were loaded into the planning software.  Then the prostate target and avoidance structures including the rectum, bladder, bowel and hips were contoured.  Treatment planning then occurred.  The radiation prescription was entered and confirmed.  A total of one complex treatment devices was fabricated. I have requested : Intensity Modulated Radiotherapy (IMRT) is medically necessary for this case for the following reason:  Rectal sparing.Marland Kitchen  PLAN:  The patient will receive 70 Gy in 28 fractions.  ________________________________  Artist Pais Kathrynn Running, M.D.

## 2022-09-09 NOTE — Anesthesia Postprocedure Evaluation (Signed)
Anesthesia Post Note  Patient: Jeffie Pollock  Procedure(s) Performed: GOLD SEED IMPLANT (Prostate) SPACE OAR INSTILLATION (Perineum)  Patient location during evaluation: Phase II Anesthesia Type: General Level of consciousness: awake Pain management: pain level controlled Vital Signs Assessment: post-procedure vital signs reviewed and stable Respiratory status: spontaneous breathing and respiratory function stable Cardiovascular status: blood pressure returned to baseline and stable Postop Assessment: no headache and no apparent nausea or vomiting Anesthetic complications: no Comments: Late entry   No notable events documented.   Last Vitals:  Vitals:   09/08/22 1245 09/08/22 1259  BP: (!) 155/69 (!) 150/67  Pulse: (!) 38 (!) 48  Resp: 19 19  Temp:  37 C  SpO2: 97% 98%    Last Pain:  Vitals:   09/09/22 1532  TempSrc:   PainSc: 0-No pain                 Windell Norfolk

## 2022-09-09 NOTE — Telephone Encounter (Signed)
CALLED PATIENT TO REMIND OF SIM APPT. FOR 09-10-22- ARRIVAL TIME- 12:45 PM @ [facility], INFORMED PATIENT TO ARRIVE WITH A FULL BLADDER, SPOKE WITH PATIENT AND HE IS AWARE OF THIS APPT. AND THE INSTRUCTIONS

## 2022-09-09 NOTE — Anesthesia Postprocedure Evaluation (Signed)
Anesthesia Post Note  Patient: [name]  Procedure(s) Performed: GOLD SEED IMPLANT (Prostate) SPACE OAR INSTILLATION (Perineum)  Patient location during evaluation: Phase II Anesthesia Type: General Level of consciousness: awake Pain management: pain level controlled Vital Signs Assessment: post-procedure vital signs reviewed and stable Respiratory status: spontaneous breathing and respiratory function stable Cardiovascular status: blood pressure returned to baseline and stable Postop Assessment: no headache and no apparent nausea or vomiting Anesthetic complications: no Comments: Late entry   No notable events documented.   Last Vitals:  Vitals:   09/08/22 1245 09/08/22 1259  BP: (!) 155/69 (!) 150/67  Pulse: (!) 38 (!) 48  Resp: 19 19  Temp:  37 C  SpO2: 97% 98%    Last Pain:  Vitals:   09/09/22 1532  TempSrc:   PainSc: 0-No pain                 Windell Norfolk

## 2022-09-10 ENCOUNTER — Ambulatory Visit
Admission: RE | Admit: 2022-09-10 | Discharge: 2022-09-10 | Disposition: A | Discharge status: Home / Self Care | Payer: Medicare Other | Source: Ambulatory Visit | Attending: Radiation Oncology | Admitting: Radiation Oncology

## 2022-09-10 DIAGNOSIS — C61 Malignant neoplasm of prostate: Secondary | ICD-10-CM | POA: Insufficient documentation

## 2022-09-10 DIAGNOSIS — Z510 Encounter for antineoplastic radiation therapy: Secondary | ICD-10-CM | POA: Diagnosis not present

## 2022-09-10 DIAGNOSIS — Z191 Hormone sensitive malignancy status: Secondary | ICD-10-CM | POA: Diagnosis not present

## 2022-09-11 NOTE — Progress Notes (Signed)
Called and introduced myself to the patient as the prostate nurse navigator.  Patient verbalized at his recent CT Simulation that he could benefit from transportation services.  RN coordinated with our transportation coordinator to ensure this will be evaluated and patient will be contacted early next week.  I gave him my direct number and asked him to call me with questions or concerns.  Verbalized understanding.   Plan of care in progress.

## 2022-09-14 ENCOUNTER — Encounter (HOSPITAL_COMMUNITY): Payer: Self-pay | Admitting: Urology

## 2022-09-15 DIAGNOSIS — Z191 Hormone sensitive malignancy status: Secondary | ICD-10-CM | POA: Diagnosis not present

## 2022-09-15 DIAGNOSIS — Z510 Encounter for antineoplastic radiation therapy: Secondary | ICD-10-CM | POA: Diagnosis not present

## 2022-09-16 ENCOUNTER — Other Ambulatory Visit (HOSPITAL_COMMUNITY): Payer: Self-pay | Admitting: Physician Assistant

## 2022-09-18 ENCOUNTER — Encounter: Payer: Self-pay | Admitting: Cardiology

## 2022-09-18 ENCOUNTER — Ambulatory Visit: Payer: Medicare Other | Attending: Cardiology | Admitting: Cardiology

## 2022-09-18 VITALS — BP 138/80 | HR 79 | Ht 71.0 in | Wt 244.0 lb

## 2022-09-18 DIAGNOSIS — I4892 Unspecified atrial flutter: Secondary | ICD-10-CM | POA: Diagnosis not present

## 2022-09-18 DIAGNOSIS — I10 Essential (primary) hypertension: Secondary | ICD-10-CM | POA: Diagnosis not present

## 2022-09-18 DIAGNOSIS — D6869 Other thrombophilia: Secondary | ICD-10-CM

## 2022-09-18 NOTE — Progress Notes (Signed)
Clinical Summary Mr. Brockel is a 72 y.o.male seen today for follow up of the following medical problems.      1. Aflutter  - no palpitations - no bleeding on eliquis.    2. Chest discomfort - occurs once a month - dull pain, left sided. 1/10 in severity. Usually comes on at rest - lasts a few minutes. Better with deep breathing.  - 2016 nuclear stress test no ischemia   -denies any symptoms.    3. Chronic diastolic HF Jan 1027 echo LVEF 25-36%, indet diastolic function - 64/4034 LVEF 55-60%, grade II dd - swelling at times, resolves with prn lasix.    - chronic LE edema more so related to venous relfux, working to wear compression stockings.  - has lasix as needed, takes daily. Last Cr had trended up to 1.4   - ongoing edema, up and down.  - has prn lasix, takes about 2-3 times a week. - SOB is up and down, overall stable over time.     4. LE edema - 2021 had signs of venous reflux by [place] - 2023 [place]: right venous reflux several veins - has compression stockings.  - followed by vacular   5. Diffuse large B cell lymphoma - prior bone marrow transplant - history of hemolytic anemia   6. HTN - has not taken meds yet today -    SH: takes care of grandson during the day, 61 yo.  Past Medical History:  Diagnosis Date   Asthma    as child   Diabetes mellitus without complication ([place])    DLBCL (diffuse large B cell lymphoma) ([place]) 02/28/2009   Edema    Heart failure, diastolic, chronic ([place])    patient denies   Hyperlipidemia, mixed    Hypertension    IDA (iron deficiency anemia)    Large cell lymphoma ([place]) 01/2005   autologous stem cell transplant 12/2005   Obesity, morbid (more than 100 lbs over ideal weight or BMI > 40) (HCC)    Venous insufficiency 04/29/2011     Allergies  Allergen Reactions   Penicillins Rash    Has patient had a PCN reaction causing immediate rash, facial/tongue/throat swelling, SOB or lightheadedness with hypotension: Unknown Has  patient had a PCN reaction causing severe rash involving mucus membranes or skin necrosis: Unknown Has patient had a PCN reaction that required hospitalization: Unknown Has patient had a PCN reaction occurring within the last 10 years: No If all of the above answers are "NO", then may proceed with Cephalosporin use.      Current Outpatient Medications  Medication Sig Dispense Refill   apixaban (ELIQUIS) 5 MG TABS tablet Take 1 tablet (5 mg total) by mouth 2 (two) times daily. 60 tablet 11   B-D ULTRAFINE III SHORT PEN 31G X 8 MM MISC USE EVERY DAY 100 each 3   Blood Glucose Monitoring Suppl (ONE TOUCH ULTRA 2) w/Device KIT USE TO TEST BLOOD SUGAR ONCE D UTD     clotrimazole-betamethasone (LOTRISONE) cream Apply 1 application topically 2 (two) times daily as needed (leg infection).      folic acid (FOLVITE) 1 MG tablet TAKE 1 TABLET(1 MG) BY MOUTH DAILY 30 tablet 4   furosemide (LASIX) 20 MG tablet Take 1 tablet (20 mg total) by mouth daily as needed for fluid or edema. 90 tablet 2   insulin degludec (TRESIBA FLEXTOUCH) 100 UNIT/ML FlexTouch Pen Inject 20 Units into the skin at bedtime. 18 mL 3  Lancets (ONETOUCH DELICA PLUS [id number]) MISC TEST TWICE DAILY 300 each 3   metFORMIN (GLUCOPHAGE) 500 MG tablet TAKE 1 TABLET(500 MG) BY MOUTH TWICE DAILY WITH A MEAL 180 tablet 3   metoprolol tartrate (LOPRESSOR) 25 MG tablet Take 1 tablet (25 mg total) by mouth 2 (two) times daily. 180 tablet 3   ONETOUCH ULTRA test strip TEST TWICE DAILY 100 strip 3   potassium chloride (KLOR-CON) 10 MEQ tablet Take 10 meq on days you need to take Lasix 90 tablet 1   rosuvastatin (CRESTOR) 40 MG tablet Take 40 mg by mouth daily.     traMADol (ULTRAM) 50 MG tablet Take 1 tablet (50 mg total) by mouth every 6 (six) hours as needed. 15 tablet 0   vitamin B-12 (CYANOCOBALAMIN) 1000 MCG tablet Take 2 tablets (2,000 mcg total) by mouth daily. 90 tablet 3   Vitamin D, Ergocalciferol, (DRISDOL) 1.25 MG (50000 UNIT) CAPS  capsule TAKE 1 CAPSULE BY MOUTH ONCE WEEKLY 16 capsule 3   No current facility-administered medications for this visit.     Past Surgical History:  Procedure Laterality Date   BACK SURGERY  2006   T6 vertebrae removed/titanium placed   CATARACT EXTRACTION W/PHACO Right 03/31/2019   Procedure: CATARACT EXTRACTION PHACO AND INTRAOCULAR LENS PLACEMENT ([place]);  Surgeon: Baruch Goldmann, MD;  Location: [facility];  Service: Ophthalmology;  Laterality: Right;  CDE: 3.21   CATARACT EXTRACTION W/PHACO Left 04/14/2019   Procedure: CATARACT EXTRACTION PHACO AND INTRAOCULAR LENS PLACEMENT ([facility]);  Surgeon: Baruch Goldmann, MD;  Location: [facility];  Service: Ophthalmology;  Laterality: Left;  left - pt knows to arrive at 7:45, CDE: 3.55   COLONOSCOPY  11/24/2004   Polyps in the left colon ablated/removed as described above.  Two submucosal lesions consistent with lipomas as described above not  manipulated./ Normal rectum   COLONOSCOPY  06/20/2012   MULTIPLE RECTAL AND COLONIC POLYPS   COLONOSCOPY N/A 03/08/2015   RMR: Capacious, redundant colon. Multiple colonic and rectosigmoid polyps removed. ablated as described above. colonic lipoma abnormal appearing terminal ileum likely a variant of normal). Howeverwith history  biopsies obtained.    ESOPHAGOGASTRODUODENOSCOPY N/A 03/08/2015   RMR: Hiatal hernia Polypoid gastric mucosa with multiple gastric polyps. largest polyp removed via snare polypectomgy hemostasis clip placed at base. Status post gastric biopsy. Status post video capsule placement.    GIVENS CAPSULE STUDY N/A 03/08/2015   Procedure: GIVENS CAPSULE STUDY;  Surgeon: Daneil Dolin, MD;  Location: [facility];  Service: Endoscopy;  Laterality: N/A;   GOLD SEED IMPLANT N/A 09/08/2022   Procedure: GOLD SEED IMPLANT;  Surgeon: Cleon Gustin, MD;  Location: [facility];  Service: Urology;  Laterality: N/A;   LIMBAL STEM CELL TRANSPLANT     LUNG BIOPSY  6/06   MULTIPLE TOOTH EXTRACTIONS  04/2005   port a  cath placement     PORT-A-CATH REMOVAL  09/08/2012   Procedure: REMOVAL PORT-A-CATH;  Surgeon: Melrose Nakayama, MD;  Location: [facility] [facility];  Service: Thoracic;  Laterality: N/A;   SPACE OAR INSTILLATION N/A 09/08/2022   Procedure: SPACE OAR INSTILLATION;  Surgeon: Cleon Gustin, MD;  Location: [facility];  Service: Urology;  Laterality: N/A;     Allergies  Allergen Reactions   Penicillins Rash    Has patient had a PCN reaction causing immediate rash, facial/tongue/throat swelling, SOB or lightheadedness with hypotension: Unknown Has patient had a PCN reaction causing severe rash involving mucus membranes or skin necrosis: Unknown Has patient had a PCN  reaction that required hospitalization: Unknown Has patient had a PCN reaction occurring within the last 10 years: No If all of the above answers are "NO", then may proceed with Cephalosporin use.       Family History  Problem Relation Age of Onset   Cancer Mother        lung   Hypertension Mother    Hyperlipidemia Mother    Cancer Father        prostate   Hypertension Father    Colon cancer Neg Hx    Diabetes Neg Hx      Social History Mr. Allnutt reports that he quit smoking about 19 years ago. His smoking use included cigarettes. He has a 7.50 pack-year smoking history. He has never used smokeless tobacco. Mr. Weldon reports no history of alcohol use.   Review of Systems CONSTITUTIONAL: No weight loss, fever, chills, weakness or fatigue.  HEENT: Eyes: No visual loss, blurred vision, double vision or yellow sclerae.No hearing loss, sneezing, congestion, runny nose or sore throat.  SKIN: No rash or itching.  CARDIOVASCULAR: per hpi RESPIRATORY: No shortness of breath, cough or sputum.  GASTROINTESTINAL: No anorexia, nausea, vomiting or diarrhea. No abdominal pain or blood.  GENITOURINARY: No burning on urination, no polyuria NEUROLOGICAL: No headache, dizziness, syncope, paralysis, ataxia, numbness or tingling in the  extremities. No change in bowel or bladder control.  MUSCULOSKELETAL: No muscle, back pain, joint pain or stiffness.  LYMPHATICS: No enlarged nodes. No history of splenectomy.  PSYCHIATRIC: No history of depression or anxiety.  ENDOCRINOLOGIC: No reports of sweating, cold or heat intolerance. No polyuria or polydipsia.  Marland Kitchen   Physical Examination Today's Vitals   09/18/22 1347  BP: 138/80  Pulse: 79  SpO2: 98%  Weight: 244 lb (110.7 kg)  Height: 5\' 11"  (1.803 m)   Body mass index is 34.03 kg/m.  Gen: resting comfortably, no acute distress HEENT: no scleral icterus, pupils equal round and reactive, no palptable cervical adenopathy,  CV: RRR, no m/r/g, no jvd Resp: Clear to auscultation bilaterally GI: abdomen is soft, non-tender, non-distended, normal bowel sounds, no hepatosplenomegaly MSK: extremities are warm, 1+ bilateral nonpitting edema Skin: warm, no rash Neuro:  no focal deficits Psych: appropriate affect   Diagnostic Studies  Nuclear stress test 03/27/15:   There was no ST segment deviation noted during stress. Defect 1: There is a small defect of moderate severity present in the basal inferior location. Due to soft tissue attenuation artifact. This is a low risk study. The left ventricular ejection fraction is normal (55-65%).      Echo 09/07/17:   - Procedure narrative: Transthoracic echocardiography. Image    quality was adequate. Intravenous contrast (Definity) was    administered.  - Left ventricle: The cavity size was normal. Wall thickness was    increased in a pattern of mild LVH. Systolic function was normal.    The estimated ejection fraction was in the range of 60% to 65%.    Wall motion was normal; there were no regional wall motion    abnormalities. The study is not technically sufficient to allow    evaluation of LV diastolic function due to atrial flutter.  - Left atrium: The atrium was mildly dilated.  - Systemic veins: IVC is dilated with  normal respiratory variation.    Estimated CVP 8 mmHg.       07/2022 echo IMPRESSIONS     1. Left ventricular ejection fraction, by estimation, is 55 to 60%. The  left  ventricle has normal function. The left ventricle has no regional  wall motion abnormalities. There is mild concentric left ventricular  hypertrophy. Left ventricular diastolic  parameters are consistent with Grade II diastolic dysfunction  (pseudonormalization).   2. Right ventricular systolic function is normal. The right ventricular  size is normal. There is normal pulmonary artery systolic pressure. The  estimated right ventricular systolic pressure is 71.9 mmHg.   3. Left atrial size was moderately dilated.   4. The mitral valve is grossly normal. Mild mitral valve regurgitation.   5. The aortic valve is tricuspid. Aortic valve regurgitation is not  visualized.   6. The inferior vena cava is dilated in size with >50% respiratory  variability, suggesting right atrial pressure of 8 mmHg.    Assessment and Plan  1. Aflutter/acquired thrombophilia - no recent symptoms, continue current meds including eliquis for stroke prevention     2. Chronic diastolic HF - a lot of his chronic LE edema is more related to venous reflux - euvoelmic from HF standpoint, continue prn lasix   3. HTN - essentially right at goal, monitor at this time. If trends uprward would need to adjust meds      Arnoldo Lenis, M.D.

## 2022-09-18 NOTE — Patient Instructions (Signed)
Medication Instructions:  Your physician recommends that you continue on your current medications as directed. Please refer to the Current Medication list given to you today.   Labwork: None  Testing/Procedures: None  Follow-Up: Follow up with Dr. Branch in 6 months.   Any Other Special Instructions Will Be Listed Below (If Applicable).     If you need a refill on your cardiac medications before your next appointment, please call your pharmacy.  

## 2022-09-18 NOTE — Progress Notes (Signed)
Patient has been contacted by transportation and was provided contact information if he decided transportation is needed.  Patient voiced he has transportation for upcoming appointments, but if needed for future appointments will contact transportation department.

## 2022-09-21 ENCOUNTER — Ambulatory Visit
Admission: RE | Admit: 2022-09-21 | Discharge: 2022-09-21 | Disposition: A | Discharge status: Home / Self Care | Payer: Medicare Other | Source: Ambulatory Visit | Attending: Radiation Oncology | Admitting: Radiation Oncology

## 2022-09-21 ENCOUNTER — Other Ambulatory Visit: Payer: Self-pay

## 2022-09-21 DIAGNOSIS — Z191 Hormone sensitive malignancy status: Secondary | ICD-10-CM | POA: Diagnosis not present

## 2022-09-21 DIAGNOSIS — Z510 Encounter for antineoplastic radiation therapy: Secondary | ICD-10-CM | POA: Diagnosis not present

## 2022-09-21 LAB — RAD ONC ARIA SESSION SUMMARY
Course Elapsed Days: 0
Plan Fractions Treated to Date: 1
Plan Prescribed Dose Per Fraction: 2.5 Gy
Plan Total Fractions Prescribed: 28
Plan Total Prescribed Dose: 70 Gy
Reference Point Dosage Given to Date: 2.5 Gy
Reference Point Session Dosage Given: 2.5 Gy
Session Number: 1

## 2022-09-22 ENCOUNTER — Ambulatory Visit (INDEPENDENT_AMBULATORY_CARE_PROVIDER_SITE_OTHER): Payer: Medicare Other | Admitting: Urology

## 2022-09-22 ENCOUNTER — Ambulatory Visit
Admission: RE | Admit: 2022-09-22 | Discharge: 2022-09-22 | Disposition: A | Discharge status: Home / Self Care | Payer: Medicare Other | Source: Ambulatory Visit | Attending: Radiation Oncology | Admitting: Radiation Oncology

## 2022-09-22 ENCOUNTER — Encounter: Payer: Self-pay | Admitting: Urology

## 2022-09-22 ENCOUNTER — Other Ambulatory Visit: Payer: Self-pay

## 2022-09-22 VITALS — BP 134/74 | HR 76

## 2022-09-22 DIAGNOSIS — R350 Frequency of micturition: Secondary | ICD-10-CM | POA: Diagnosis not present

## 2022-09-22 DIAGNOSIS — Z191 Hormone sensitive malignancy status: Secondary | ICD-10-CM | POA: Diagnosis not present

## 2022-09-22 DIAGNOSIS — C61 Malignant neoplasm of prostate: Secondary | ICD-10-CM

## 2022-09-22 DIAGNOSIS — Z510 Encounter for antineoplastic radiation therapy: Secondary | ICD-10-CM | POA: Diagnosis not present

## 2022-09-22 LAB — RAD ONC ARIA SESSION SUMMARY
Course Elapsed Days: 1
Plan Fractions Treated to Date: 2
Plan Prescribed Dose Per Fraction: 2.5 Gy
Plan Total Fractions Prescribed: 28
Plan Total Prescribed Dose: 70 Gy
Reference Point Dosage Given to Date: 5 Gy
Reference Point Session Dosage Given: 2.5 Gy
Session Number: 2

## 2022-09-22 NOTE — Patient Instructions (Signed)

## 2022-09-22 NOTE — Progress Notes (Signed)
09/22/2022 1:58 PM   [name] 1951-07-03 [id number]  Referring provider: Redmond School, MD [address] [place],  [place] [address]  Followup prostate cancer and BPH   HPI: [name] is a 72yo here for followup for BPH and prostate cancer. He is doing well after fiducial markers and [employer]. He started IMRT yesterday. IPSS 5 QOL 1 on no BPH therapy   PMH: Past Medical History:  Diagnosis Date   Asthma    as child   Diabetes mellitus without complication ([place])    DLBCL (diffuse large B cell lymphoma) ([place]) 02/28/2009   Edema    Heart failure, diastolic, chronic ([place])    patient denies   Hyperlipidemia, mixed    Hypertension    IDA (iron deficiency anemia)    Large cell lymphoma ([place]) 01/2005   autologous stem cell transplant 12/2005   Obesity, morbid (more than 100 lbs over ideal weight or BMI > 40) ([place])    Venous insufficiency 04/29/2011    Surgical History: Past Surgical History:  Procedure Laterality Date   BACK SURGERY  2006   T6 vertebrae removed/titanium placed   CATARACT EXTRACTION W/PHACO Right 03/31/2019   Procedure: CATARACT EXTRACTION PHACO AND INTRAOCULAR LENS PLACEMENT ([place]);  Surgeon: Frank Goldmann, MD;  Location: [facility];  Service: Ophthalmology;  Laterality: Right;  CDE: 3.21   CATARACT EXTRACTION W/PHACO Left 04/14/2019   Procedure: CATARACT EXTRACTION PHACO AND INTRAOCULAR LENS PLACEMENT (IOC);  Surgeon: Frank Goldmann, MD;  Location: [facility];  Service: Ophthalmology;  Laterality: Left;  left - pt knows to arrive at 7:45, CDE: 3.55   COLONOSCOPY  11/24/2004   Polyps in the left colon ablated/removed as described above.  Two submucosal lesions consistent with lipomas as described above not  manipulated./ Normal rectum   COLONOSCOPY  06/20/2012   MULTIPLE RECTAL AND COLONIC POLYPS   COLONOSCOPY N/A 03/08/2015   RMR: Capacious, redundant colon. Multiple colonic and rectosigmoid polyps removed. ablated as described above. colonic lipoma abnormal  appearing terminal ileum likely a variant of normal). Howeverwith history  biopsies obtained.    ESOPHAGOGASTRODUODENOSCOPY N/A 03/08/2015   RMR: Hiatal hernia Polypoid gastric mucosa with multiple gastric polyps. largest polyp removed via snare polypectomgy hemostasis clip placed at base. Status post gastric biopsy. Status post video capsule placement.    GIVENS CAPSULE STUDY N/A 03/08/2015   Procedure: GIVENS CAPSULE STUDY;  Surgeon: Frank Dolin, MD;  [facility];  Service: Endoscopy;  Laterality: N/A;   GOLD SEED IMPLANT N/A 09/08/2022   Procedure: GOLD SEED IMPLANT;  Surgeon: Frank Gustin, MD;  Location: [facility];  Service: Urology;  Laterality: N/A;   LIMBAL STEM CELL TRANSPLANT     LUNG BIOPSY  6/06   MULTIPLE TOOTH EXTRACTIONS  04/2005   port a cath placement     PORT-A-CATH REMOVAL  09/08/2012   Procedure: REMOVAL PORT-A-CATH;  Surgeon: Frank Nakayama, MD;  Location: [facility];  Service: Thoracic;  Laterality: N/A;   SPACE OAR INSTILLATION N/A 09/08/2022   Procedure: SPACE OAR INSTILLATION;  Surgeon: Frank Gustin, MD;  Location: [facility];  Service: Urology;  Laterality: N/A;    Home Medications:  Allergies as of 09/22/2022       Reactions   Penicillins Rash   Has patient had a PCN reaction causing immediate rash, facial/tongue/throat swelling, SOB or lightheadedness with hypotension: Unknown Has patient had a PCN reaction causing severe rash involving mucus membranes or skin necrosis: Unknown Has patient had a PCN reaction  that required hospitalization: Unknown Has patient had a PCN reaction occurring within the last 10 years: No If all of the above answers are "NO", then may proceed with Cephalosporin use.        Medication List        Accurate as of September 22, 2022  1:58 PM. If you have any questions, ask your nurse or doctor.          apixaban 5 MG Tabs tablet Commonly known as: Eliquis Take 1 tablet (5 mg total) by mouth 2 (two) times  daily.   B-D ULTRAFINE III SHORT PEN 31G X 8 MM Misc Generic drug: Insulin Pen Needle USE EVERY DAY   clotrimazole-betamethasone cream Commonly known as: LOTRISONE Apply 1 application topically 2 (two) times daily as needed (leg infection).   cyanocobalamin 1000 MCG tablet Commonly known as: VITAMIN B12 Take 2 tablets (2,000 mcg total) by mouth daily.   folic acid 1 MG tablet Commonly known as: FOLVITE TAKE 1 TABLET(1 MG) BY MOUTH DAILY   furosemide 20 MG tablet Commonly known as: LASIX Take 1 tablet (20 mg total) by mouth daily as needed for fluid or edema.   metFORMIN 500 MG tablet Commonly known as: GLUCOPHAGE TAKE 1 TABLET(500 MG) BY MOUTH TWICE DAILY WITH A MEAL   metoprolol tartrate 25 MG tablet Commonly known as: LOPRESSOR Take 1 tablet (25 mg total) by mouth 2 (two) times daily.   ONE TOUCH ULTRA 2 w/Device Kit USE TO TEST BLOOD SUGAR ONCE D UTD   OneTouch Delica Plus [id number] Misc TEST TWICE DAILY   OneTouch Ultra test strip Generic drug: glucose blood TEST TWICE DAILY   potassium chloride 10 MEQ tablet Commonly known as: KLOR-CON Take 10 meq on days you need to take Lasix What changed: how much to take   rosuvastatin 40 MG tablet Commonly known as: CRESTOR Take 40 mg by mouth daily.   traMADol 50 MG tablet Commonly known as: Ultram Take 1 tablet (50 mg total) by mouth every 6 (six) hours as needed.   [name] FlexTouch 100 UNIT/ML FlexTouch Pen Generic drug: insulin degludec Inject 20 Units into the skin at bedtime.   Vitamin D (Ergocalciferol) 1.25 MG (50000 UNIT) Caps capsule Commonly known as: DRISDOL TAKE 1 CAPSULE BY MOUTH ONCE WEEKLY        Allergies:  Allergies  Allergen Reactions   Penicillins Rash    Has patient had a PCN reaction causing immediate rash, facial/tongue/throat swelling, SOB or lightheadedness with hypotension: Unknown Has patient had a PCN reaction causing severe rash involving mucus membranes or skin necrosis:  Unknown Has patient had a PCN reaction that required hospitalization: Unknown Has patient had a PCN reaction occurring within the last 10 years: No If all of the above answers are "NO", then may proceed with Cephalosporin use.     Family History: Family History  Problem Relation Age of Onset   Cancer Mother        lung   Hypertension Mother    Hyperlipidemia Mother    Cancer Father        prostate   Hypertension Father    Colon cancer Neg Hx    Diabetes Neg Hx     Social History:  reports that he quit smoking about 19 years ago. His smoking use included cigarettes. He has a 7.50 pack-year smoking history. He has never used smokeless tobacco. He reports that he does not drink alcohol and does not use drugs.  ROS: All other review  of systems were reviewed and are negative except what is noted above in HPI  Physical Exam: BP 134/74   Pulse 76   Constitutional:  Alert and oriented, No acute distress. HEENT: [place] AT, moist mucus membranes.  Trachea midline, no masses. Cardiovascular: No clubbing, cyanosis, or edema. Respiratory: Normal respiratory effort, no increased work of breathing. GI: Abdomen is soft, nontender, nondistended, no abdominal masses GU: No CVA tenderness.  Lymph: No cervical or inguinal lymphadenopathy. Skin: No rashes, bruises or suspicious lesions. Neurologic: Grossly intact, no focal deficits, moving all 4 extremities. Psychiatric: Normal mood and affect.  Laboratory Data: Lab Results  Component Value Date   WBC 4.0 08/06/2022   HGB 11.7 (L) 08/06/2022   HCT 37.0 (L) 08/06/2022   MCV 98.4 08/06/2022   PLT 151 08/06/2022    Lab Results  Component Value Date   CREATININE 1.44 (H) 08/06/2022    No results found for: "PSA"  No results found for: "TESTOSTERONE"  Lab Results  Component Value Date   HGBA1C 4.9 08/11/2022    Urinalysis    Component Value Date/Time   COLORURINE AMBER (A) 08/02/2019 2010   APPEARANCEUR Clear 07/20/2022 1417    LABSPEC 1.018 08/02/2019 2010   PHURINE 5.0 08/02/2019 2010   GLUCOSEU Negative 07/20/2022 1417   HGBUR MODERATE (A) 08/02/2019 2010   BILIRUBINUR Negative 07/20/2022 1417   KETONESUR 5 (A) 08/02/2019 2010   PROTEINUR 3+ (A) 07/20/2022 1417   PROTEINUR 30 (A) 08/02/2019 2010   NITRITE Negative 07/20/2022 1417   NITRITE NEGATIVE 08/02/2019 2010   LEUKOCYTESUR Negative 07/20/2022 1417   LEUKOCYTESUR NEGATIVE 08/02/2019 2010    Lab Results  Component Value Date   LABMICR See below: 07/20/2022   WBCUA 0-5 07/20/2022   LABEPIT 0-10 07/20/2022   BACTERIA None seen 07/20/2022    Pertinent Imaging:  No results found for this or any previous visit.  Results for orders placed during the hospital encounter of 10/24/08  US VenoUS Imaging Bilateral  Narrative Clinical Data: Elevated D-dimer  BILATERAL LOWER EXTREMITY VENOUS DUPLEX ULTRASOUND  Technique: Gray-scale sonography with graded compression, as well as color Doppler and duplex ultrasound, were performed to evaluate the deep venous system of both lower extremities from the level of the common femoral vein through the popliteal and proximal calf veins. Spectral Doppler was utilized to evaluate flow at rest and with distal augmentation maneuvers.  Comparison: None  Findings:  Normal compressibility of  bilateral common femoral, superficial femoral, and popliteal veins is demonstrated, as well as the visualized proximal calf veins.  No filling defects to suggest DVT on grayscale or color Doppler imaging.  Doppler waveforms show normal direction of venous flow, normal respiratory phasicity and response to augmentation.  IMPRESSION: No evidence of bilateral lower extremity deep vein thrombosis.  Provider: Eldridge Scot  No results found for this or any previous visit.  No results found for this or any previous visit.  Results for orders placed during the hospital encounter of 05/18/17  US  RENAL  Narrative CLINICAL DATA:  Acute kidney injury. Admitted for pneumonia. History of hypertension, lymphoma, diabetes.  EXAM: RENAL / URINARY TRACT ULTRASOUND COMPLETE  COMPARISON:  CT chest, abdomen and pelvis May 18, 2017  FINDINGS: Right Kidney:  Length: 12.8 cm. Echogenicity within normal limits. No mass or hydronephrosis visualized.  Left Kidney:  Length: 11 cm. Echogenicity within normal limits. No mass or hydronephrosis visualized.  Bladder:  Decompressed.  Incidental LEFT pleural effusion as seen on yesterday's CT chest.  IMPRESSION: Normal renal ultrasound.   Electronically Signed By: Elon Alas M.D. On: 05/19/2017 13:46  No valid procedures specified. No results found for this or any previous visit.  No results found for this or any previous visit.   Assessment & Plan:    1. Prostate cancer ([place]) Followup 3 months with PSA - Urinalysis, Routine w reflex microscopic  2. Urinary frequency -Followup 1 month for symptoms check    No follow-ups on file.  Nicolette Bang, MD  [facility] [place]

## 2022-09-23 ENCOUNTER — Ambulatory Visit: Admission: RE | Admit: 2022-09-23 | Payer: Medicare Other | Source: Ambulatory Visit

## 2022-09-24 ENCOUNTER — Ambulatory Visit
Admission: RE | Admit: 2022-09-24 | Discharge: 2022-09-24 | Disposition: A | Discharge status: Home / Self Care | Payer: Medicare Other | Source: Ambulatory Visit | Attending: Radiation Oncology | Admitting: Radiation Oncology

## 2022-09-24 ENCOUNTER — Other Ambulatory Visit: Payer: Self-pay

## 2022-09-24 ENCOUNTER — Other Ambulatory Visit: Payer: Self-pay | Admitting: *Deleted

## 2022-09-24 DIAGNOSIS — C61 Malignant neoplasm of prostate: Secondary | ICD-10-CM | POA: Insufficient documentation

## 2022-09-24 DIAGNOSIS — Z510 Encounter for antineoplastic radiation therapy: Secondary | ICD-10-CM | POA: Insufficient documentation

## 2022-09-24 DIAGNOSIS — Z191 Hormone sensitive malignancy status: Secondary | ICD-10-CM | POA: Diagnosis not present

## 2022-09-24 LAB — RAD ONC ARIA SESSION SUMMARY
Course Elapsed Days: 3
Plan Fractions Treated to Date: 3
Plan Prescribed Dose Per Fraction: 2.5 Gy
Plan Total Fractions Prescribed: 28
Plan Total Prescribed Dose: 70 Gy
Reference Point Dosage Given to Date: 7.5 Gy
Reference Point Session Dosage Given: 2.5 Gy
Session Number: 3

## 2022-09-24 MED ORDER — FOLIC ACID 1 MG PO TABS: ORAL_TABLET | ORAL | 4 refills | Status: DC

## 2022-09-25 ENCOUNTER — Ambulatory Visit: Payer: Medicare Other

## 2022-09-25 ENCOUNTER — Other Ambulatory Visit: Payer: Self-pay

## 2022-09-25 ENCOUNTER — Ambulatory Visit
Admission: RE | Admit: 2022-09-25 | Discharge: 2022-09-25 | Disposition: A | Discharge status: Home / Self Care | Payer: Medicare Other | Source: Ambulatory Visit | Attending: Radiation Oncology | Admitting: Radiation Oncology

## 2022-09-25 DIAGNOSIS — Z510 Encounter for antineoplastic radiation therapy: Secondary | ICD-10-CM | POA: Diagnosis not present

## 2022-09-25 DIAGNOSIS — Z191 Hormone sensitive malignancy status: Secondary | ICD-10-CM | POA: Diagnosis not present

## 2022-09-25 LAB — RAD ONC ARIA SESSION SUMMARY
Course Elapsed Days: 4
Plan Fractions Treated to Date: 4
Plan Prescribed Dose Per Fraction: 2.5 Gy
Plan Total Fractions Prescribed: 28
Plan Total Prescribed Dose: 70 Gy
Reference Point Dosage Given to Date: 10 Gy
Reference Point Session Dosage Given: 2.5 Gy
Session Number: 4

## 2022-09-28 ENCOUNTER — Other Ambulatory Visit: Payer: Self-pay

## 2022-09-28 ENCOUNTER — Ambulatory Visit
Admission: RE | Admit: 2022-09-28 | Discharge: 2022-09-28 | Disposition: A | Discharge status: Home / Self Care | Payer: Medicare Other | Source: Ambulatory Visit | Attending: Radiation Oncology | Admitting: Radiation Oncology

## 2022-09-28 DIAGNOSIS — Z191 Hormone sensitive malignancy status: Secondary | ICD-10-CM | POA: Diagnosis not present

## 2022-09-28 DIAGNOSIS — Z510 Encounter for antineoplastic radiation therapy: Secondary | ICD-10-CM | POA: Diagnosis not present

## 2022-09-28 LAB — RAD ONC ARIA SESSION SUMMARY
Course Elapsed Days: 7
Plan Fractions Treated to Date: 5
Plan Prescribed Dose Per Fraction: 2.5 Gy
Plan Total Fractions Prescribed: 28
Plan Total Prescribed Dose: 70 Gy
Reference Point Dosage Given to Date: 12.5 Gy
Reference Point Session Dosage Given: 2.5 Gy
Session Number: 5

## 2022-09-29 ENCOUNTER — Ambulatory Visit
Admission: RE | Admit: 2022-09-29 | Discharge: 2022-09-29 | Disposition: A | Discharge status: Home / Self Care | Payer: Medicare Other | Source: Ambulatory Visit | Attending: Radiation Oncology | Admitting: Radiation Oncology

## 2022-09-29 ENCOUNTER — Other Ambulatory Visit: Payer: Self-pay

## 2022-09-29 DIAGNOSIS — Z191 Hormone sensitive malignancy status: Secondary | ICD-10-CM | POA: Diagnosis not present

## 2022-09-29 DIAGNOSIS — Z510 Encounter for antineoplastic radiation therapy: Secondary | ICD-10-CM | POA: Diagnosis not present

## 2022-09-29 LAB — RAD ONC ARIA SESSION SUMMARY
Course Elapsed Days: 8
Plan Fractions Treated to Date: 6
Plan Prescribed Dose Per Fraction: 2.5 Gy
Plan Total Fractions Prescribed: 28
Plan Total Prescribed Dose: 70 Gy
Reference Point Dosage Given to Date: 15 Gy
Reference Point Session Dosage Given: 2.5 Gy
Session Number: 6

## 2022-09-30 ENCOUNTER — Ambulatory Visit
Admission: RE | Admit: 2022-09-30 | Discharge: 2022-09-30 | Disposition: A | Discharge status: Home / Self Care | Payer: Medicare Other | Source: Ambulatory Visit | Attending: Radiation Oncology | Admitting: Radiation Oncology

## 2022-09-30 ENCOUNTER — Other Ambulatory Visit: Payer: Self-pay

## 2022-09-30 DIAGNOSIS — Z191 Hormone sensitive malignancy status: Secondary | ICD-10-CM | POA: Diagnosis not present

## 2022-09-30 DIAGNOSIS — Z510 Encounter for antineoplastic radiation therapy: Secondary | ICD-10-CM | POA: Diagnosis not present

## 2022-09-30 LAB — RAD ONC ARIA SESSION SUMMARY
Course Elapsed Days: 9
Plan Fractions Treated to Date: 7
Plan Prescribed Dose Per Fraction: 2.5 Gy
Plan Total Fractions Prescribed: 28
Plan Total Prescribed Dose: 70 Gy
Reference Point Dosage Given to Date: 17.5 Gy
Reference Point Session Dosage Given: 2.5 Gy
Session Number: 7

## 2022-10-01 ENCOUNTER — Ambulatory Visit
Admission: RE | Admit: 2022-10-01 | Discharge: 2022-10-01 | Disposition: A | Discharge status: Home / Self Care | Payer: Medicare Other | Source: Ambulatory Visit | Attending: Radiation Oncology | Admitting: Radiation Oncology

## 2022-10-01 ENCOUNTER — Other Ambulatory Visit: Payer: Self-pay

## 2022-10-01 ENCOUNTER — Encounter (HOSPITAL_COMMUNITY): Payer: Self-pay | Admitting: Hematology

## 2022-10-01 DIAGNOSIS — Z510 Encounter for antineoplastic radiation therapy: Secondary | ICD-10-CM | POA: Diagnosis not present

## 2022-10-01 DIAGNOSIS — Z191 Hormone sensitive malignancy status: Secondary | ICD-10-CM | POA: Diagnosis not present

## 2022-10-01 LAB — RAD ONC ARIA SESSION SUMMARY
Course Elapsed Days: 10
Plan Fractions Treated to Date: 8
Plan Prescribed Dose Per Fraction: 2.5 Gy
Plan Total Fractions Prescribed: 28
Plan Total Prescribed Dose: 70 Gy
Reference Point Dosage Given to Date: 20 Gy
Reference Point Session Dosage Given: 2.5 Gy
Session Number: 8

## 2022-10-02 ENCOUNTER — Ambulatory Visit: Admission: RE | Admit: 2022-10-02 | Payer: Medicare Other | Source: Ambulatory Visit

## 2022-10-02 ENCOUNTER — Other Ambulatory Visit: Payer: Self-pay

## 2022-10-05 ENCOUNTER — Ambulatory Visit: Payer: Medicare Other

## 2022-10-06 ENCOUNTER — Ambulatory Visit: Payer: Medicare Other

## 2022-10-06 ENCOUNTER — Telehealth: Payer: Self-pay

## 2022-10-06 NOTE — Telephone Encounter (Signed)
RN called [name] to see how he was doing since he has canceled radiation treatment the past 3 days.  [name] explained that he wasn't feeling well on 10/02/2022 when he woke up was having diarrhea, fatigue and nausea.  Then over the weekend started coughing but denies fever or vomiting. Reports some SOB with exertion, coughing up mucus no blood.  Reports no appetite but is trying to stay hydrated as best he can.  RN advised [name] to try Imodium AD to help with the diarrhea, increase water intake, and try eating light something like broth and crackers.  If able to get to his PCP to check in just to rule out COVID, RSV, or flu.  [name] verbalized understanding and stated he would do what he can.  [name] appreciated the call and said he will let staff know tomorrow how he's feeling.  RN expressed the importance of not missing too many treatments and he agreed said he will try.

## 2022-10-07 ENCOUNTER — Ambulatory Visit: Payer: Medicare Other

## 2022-10-08 ENCOUNTER — Ambulatory Visit
Admission: RE | Admit: 2022-10-08 | Discharge: 2022-10-08 | Disposition: A | Discharge status: Home / Self Care | Payer: Medicare Other | Source: Ambulatory Visit | Attending: Radiation Oncology | Admitting: Radiation Oncology

## 2022-10-08 ENCOUNTER — Other Ambulatory Visit: Payer: Self-pay

## 2022-10-08 DIAGNOSIS — Z191 Hormone sensitive malignancy status: Secondary | ICD-10-CM | POA: Diagnosis not present

## 2022-10-08 DIAGNOSIS — Z510 Encounter for antineoplastic radiation therapy: Secondary | ICD-10-CM | POA: Diagnosis not present

## 2022-10-08 LAB — RAD ONC ARIA SESSION SUMMARY
Course Elapsed Days: 17
Plan Fractions Treated to Date: 9
Plan Prescribed Dose Per Fraction: 2.5 Gy
Plan Total Fractions Prescribed: 28
Plan Total Prescribed Dose: 70 Gy
Reference Point Dosage Given to Date: 22.5 Gy
Reference Point Session Dosage Given: 2.5 Gy
Session Number: 9

## 2022-10-09 ENCOUNTER — Ambulatory Visit: Admission: RE | Admit: 2022-10-09 | Payer: Medicare Other | Source: Ambulatory Visit

## 2022-10-09 ENCOUNTER — Ambulatory Visit
Admission: RE | Admit: 2022-10-09 | Discharge: 2022-10-09 | Disposition: A | Discharge status: Home / Self Care | Payer: Medicare Other | Source: Ambulatory Visit | Attending: Radiation Oncology | Admitting: Radiation Oncology

## 2022-10-09 ENCOUNTER — Other Ambulatory Visit: Payer: Self-pay

## 2022-10-09 DIAGNOSIS — Z510 Encounter for antineoplastic radiation therapy: Secondary | ICD-10-CM | POA: Diagnosis not present

## 2022-10-09 DIAGNOSIS — Z191 Hormone sensitive malignancy status: Secondary | ICD-10-CM | POA: Diagnosis not present

## 2022-10-09 LAB — RAD ONC ARIA SESSION SUMMARY
Course Elapsed Days: 18
Plan Fractions Treated to Date: 10
Plan Prescribed Dose Per Fraction: 2.5 Gy
Plan Total Fractions Prescribed: 28
Plan Total Prescribed Dose: 70 Gy
Reference Point Dosage Given to Date: 25 Gy
Reference Point Session Dosage Given: 2.5 Gy
Session Number: 10

## 2022-10-12 ENCOUNTER — Other Ambulatory Visit: Payer: Self-pay

## 2022-10-12 ENCOUNTER — Ambulatory Visit
Admission: RE | Admit: 2022-10-12 | Discharge: 2022-10-12 | Disposition: A | Discharge status: Home / Self Care | Payer: Medicare Other | Source: Ambulatory Visit | Attending: Radiation Oncology | Admitting: Radiation Oncology

## 2022-10-12 DIAGNOSIS — Z191 Hormone sensitive malignancy status: Secondary | ICD-10-CM | POA: Diagnosis not present

## 2022-10-12 DIAGNOSIS — Z510 Encounter for antineoplastic radiation therapy: Secondary | ICD-10-CM | POA: Diagnosis not present

## 2022-10-12 LAB — RAD ONC ARIA SESSION SUMMARY
Course Elapsed Days: 21
Plan Fractions Treated to Date: 11
Plan Prescribed Dose Per Fraction: 2.5 Gy
Plan Total Fractions Prescribed: 28
Plan Total Prescribed Dose: 70 Gy
Reference Point Dosage Given to Date: 27.5 Gy
Reference Point Session Dosage Given: 2.5 Gy
Session Number: 11

## 2022-10-13 ENCOUNTER — Other Ambulatory Visit: Payer: Self-pay

## 2022-10-13 ENCOUNTER — Ambulatory Visit
Admission: RE | Admit: 2022-10-13 | Discharge: 2022-10-13 | Disposition: A | Discharge status: Home / Self Care | Payer: Medicare Other | Source: Ambulatory Visit | Attending: Radiation Oncology | Admitting: Radiation Oncology

## 2022-10-13 DIAGNOSIS — Z191 Hormone sensitive malignancy status: Secondary | ICD-10-CM | POA: Diagnosis not present

## 2022-10-13 DIAGNOSIS — Z510 Encounter for antineoplastic radiation therapy: Secondary | ICD-10-CM | POA: Diagnosis not present

## 2022-10-13 LAB — RAD ONC ARIA SESSION SUMMARY
Course Elapsed Days: 22
Plan Fractions Treated to Date: 12
Plan Prescribed Dose Per Fraction: 2.5 Gy
Plan Total Fractions Prescribed: 28
Plan Total Prescribed Dose: 70 Gy
Reference Point Dosage Given to Date: 30 Gy
Reference Point Session Dosage Given: 2.5 Gy
Session Number: 12

## 2022-10-14 ENCOUNTER — Other Ambulatory Visit: Payer: Self-pay

## 2022-10-14 ENCOUNTER — Ambulatory Visit
Admission: RE | Admit: 2022-10-14 | Discharge: 2022-10-14 | Disposition: A | Discharge status: Home / Self Care | Payer: Medicare Other | Source: Ambulatory Visit | Attending: Radiation Oncology | Admitting: Radiation Oncology

## 2022-10-14 DIAGNOSIS — Z191 Hormone sensitive malignancy status: Secondary | ICD-10-CM | POA: Diagnosis not present

## 2022-10-14 DIAGNOSIS — Z510 Encounter for antineoplastic radiation therapy: Secondary | ICD-10-CM | POA: Diagnosis not present

## 2022-10-14 LAB — RAD ONC ARIA SESSION SUMMARY
Course Elapsed Days: 23
Plan Fractions Treated to Date: 13
Plan Prescribed Dose Per Fraction: 2.5 Gy
Plan Total Fractions Prescribed: 28
Plan Total Prescribed Dose: 70 Gy
Reference Point Dosage Given to Date: 32.5 Gy
Reference Point Session Dosage Given: 2.5 Gy
Session Number: 13

## 2022-10-15 ENCOUNTER — Other Ambulatory Visit: Payer: Self-pay

## 2022-10-15 ENCOUNTER — Ambulatory Visit
Admission: RE | Admit: 2022-10-15 | Discharge: 2022-10-15 | Disposition: A | Discharge status: Home / Self Care | Payer: Medicare Other | Source: Ambulatory Visit | Attending: Radiation Oncology | Admitting: Radiation Oncology

## 2022-10-15 DIAGNOSIS — Z191 Hormone sensitive malignancy status: Secondary | ICD-10-CM | POA: Diagnosis not present

## 2022-10-15 DIAGNOSIS — Z510 Encounter for antineoplastic radiation therapy: Secondary | ICD-10-CM | POA: Diagnosis not present

## 2022-10-15 LAB — RAD ONC ARIA SESSION SUMMARY
Course Elapsed Days: 24
Plan Fractions Treated to Date: 14
Plan Prescribed Dose Per Fraction: 2.5 Gy
Plan Total Fractions Prescribed: 28
Plan Total Prescribed Dose: 70 Gy
Reference Point Dosage Given to Date: 35 Gy
Reference Point Session Dosage Given: 2.5 Gy
Session Number: 14

## 2022-10-16 ENCOUNTER — Other Ambulatory Visit: Payer: Self-pay

## 2022-10-16 ENCOUNTER — Ambulatory Visit
Admission: RE | Admit: 2022-10-16 | Discharge: 2022-10-16 | Disposition: A | Discharge status: Home / Self Care | Payer: Medicare Other | Source: Ambulatory Visit | Attending: Radiation Oncology | Admitting: Radiation Oncology

## 2022-10-16 DIAGNOSIS — Z510 Encounter for antineoplastic radiation therapy: Secondary | ICD-10-CM | POA: Diagnosis not present

## 2022-10-16 DIAGNOSIS — Z191 Hormone sensitive malignancy status: Secondary | ICD-10-CM | POA: Diagnosis not present

## 2022-10-16 LAB — RAD ONC ARIA SESSION SUMMARY
Course Elapsed Days: 25
Plan Fractions Treated to Date: 15
Plan Prescribed Dose Per Fraction: 2.5 Gy
Plan Total Fractions Prescribed: 28
Plan Total Prescribed Dose: 70 Gy
Reference Point Dosage Given to Date: 37.5 Gy
Reference Point Session Dosage Given: 2.5 Gy
Session Number: 15

## 2022-10-19 ENCOUNTER — Ambulatory Visit
Admission: RE | Admit: 2022-10-19 | Discharge: 2022-10-19 | Disposition: A | Discharge status: Home / Self Care | Payer: Medicare Other | Source: Ambulatory Visit | Attending: Radiation Oncology | Admitting: Radiation Oncology

## 2022-10-19 ENCOUNTER — Other Ambulatory Visit: Payer: Self-pay

## 2022-10-19 DIAGNOSIS — Z510 Encounter for antineoplastic radiation therapy: Secondary | ICD-10-CM | POA: Diagnosis not present

## 2022-10-19 DIAGNOSIS — Z191 Hormone sensitive malignancy status: Secondary | ICD-10-CM | POA: Diagnosis not present

## 2022-10-19 LAB — RAD ONC ARIA SESSION SUMMARY
Course Elapsed Days: 28
Plan Fractions Treated to Date: 16
Plan Prescribed Dose Per Fraction: 2.5 Gy
Plan Total Fractions Prescribed: 28
Plan Total Prescribed Dose: 70 Gy
Reference Point Dosage Given to Date: 40 Gy
Reference Point Session Dosage Given: 2.5 Gy
Session Number: 16

## 2022-10-20 ENCOUNTER — Ambulatory Visit
Admission: RE | Admit: 2022-10-20 | Discharge: 2022-10-20 | Disposition: A | Discharge status: Home / Self Care | Payer: Medicare Other | Source: Ambulatory Visit | Attending: Radiation Oncology | Admitting: Radiation Oncology

## 2022-10-20 ENCOUNTER — Other Ambulatory Visit: Payer: Self-pay

## 2022-10-20 DIAGNOSIS — Z191 Hormone sensitive malignancy status: Secondary | ICD-10-CM | POA: Diagnosis not present

## 2022-10-20 DIAGNOSIS — Z510 Encounter for antineoplastic radiation therapy: Secondary | ICD-10-CM | POA: Diagnosis not present

## 2022-10-20 LAB — RAD ONC ARIA SESSION SUMMARY
Course Elapsed Days: 29
Plan Fractions Treated to Date: 17
Plan Prescribed Dose Per Fraction: 2.5 Gy
Plan Total Fractions Prescribed: 28
Plan Total Prescribed Dose: 70 Gy
Reference Point Dosage Given to Date: 42.5 Gy
Reference Point Session Dosage Given: 2.5 Gy
Session Number: 17

## 2022-10-21 ENCOUNTER — Ambulatory Visit
Admission: RE | Admit: 2022-10-21 | Discharge: 2022-10-21 | Disposition: A | Discharge status: Home / Self Care | Payer: Medicare Other | Source: Ambulatory Visit | Attending: Radiation Oncology | Admitting: Radiation Oncology

## 2022-10-21 ENCOUNTER — Other Ambulatory Visit: Payer: Self-pay

## 2022-10-21 DIAGNOSIS — Z191 Hormone sensitive malignancy status: Secondary | ICD-10-CM | POA: Diagnosis not present

## 2022-10-21 DIAGNOSIS — Z510 Encounter for antineoplastic radiation therapy: Secondary | ICD-10-CM | POA: Diagnosis not present

## 2022-10-21 LAB — RAD ONC ARIA SESSION SUMMARY
Course Elapsed Days: 30
Plan Fractions Treated to Date: 18
Plan Prescribed Dose Per Fraction: 2.5 Gy
Plan Total Fractions Prescribed: 28
Plan Total Prescribed Dose: 70 Gy
Reference Point Dosage Given to Date: 45 Gy
Reference Point Session Dosage Given: 2.5 Gy
Session Number: 18

## 2022-10-22 ENCOUNTER — Ambulatory Visit
Admission: RE | Admit: 2022-10-22 | Discharge: 2022-10-22 | Disposition: A | Discharge status: Home / Self Care | Payer: Medicare Other | Source: Ambulatory Visit | Attending: Radiation Oncology | Admitting: Radiation Oncology

## 2022-10-22 ENCOUNTER — Other Ambulatory Visit: Payer: Self-pay

## 2022-10-22 DIAGNOSIS — Z191 Hormone sensitive malignancy status: Secondary | ICD-10-CM | POA: Diagnosis not present

## 2022-10-22 DIAGNOSIS — Z510 Encounter for antineoplastic radiation therapy: Secondary | ICD-10-CM | POA: Diagnosis not present

## 2022-10-22 LAB — RAD ONC ARIA SESSION SUMMARY
Course Elapsed Days: 31
Plan Fractions Treated to Date: 19
Plan Prescribed Dose Per Fraction: 2.5 Gy
Plan Total Fractions Prescribed: 28
Plan Total Prescribed Dose: 70 Gy
Reference Point Dosage Given to Date: 47.5 Gy
Reference Point Session Dosage Given: 2.5 Gy
Session Number: 19

## 2022-10-23 ENCOUNTER — Ambulatory Visit
Admission: RE | Admit: 2022-10-23 | Discharge: 2022-10-23 | Disposition: A | Discharge status: Home / Self Care | Payer: Medicare Other | Source: Ambulatory Visit | Attending: Radiation Oncology | Admitting: Radiation Oncology

## 2022-10-23 ENCOUNTER — Other Ambulatory Visit: Payer: Self-pay

## 2022-10-23 ENCOUNTER — Ambulatory Visit: Payer: Medicare Other

## 2022-10-23 DIAGNOSIS — Z191 Hormone sensitive malignancy status: Secondary | ICD-10-CM | POA: Diagnosis not present

## 2022-10-23 DIAGNOSIS — Z510 Encounter for antineoplastic radiation therapy: Secondary | ICD-10-CM | POA: Insufficient documentation

## 2022-10-23 DIAGNOSIS — C61 Malignant neoplasm of prostate: Secondary | ICD-10-CM | POA: Insufficient documentation

## 2022-10-23 LAB — RAD ONC ARIA SESSION SUMMARY
Course Elapsed Days: 32
Plan Fractions Treated to Date: 20
Plan Prescribed Dose Per Fraction: 2.5 Gy
Plan Total Fractions Prescribed: 28
Plan Total Prescribed Dose: 70 Gy
Reference Point Dosage Given to Date: 50 Gy
Reference Point Session Dosage Given: 2.5 Gy
Session Number: 20

## 2022-10-26 ENCOUNTER — Other Ambulatory Visit: Payer: Self-pay

## 2022-10-26 ENCOUNTER — Ambulatory Visit
Admission: RE | Admit: 2022-10-26 | Discharge: 2022-10-26 | Disposition: A | Discharge status: Home / Self Care | Payer: Medicare Other | Source: Ambulatory Visit | Attending: Radiation Oncology | Admitting: Radiation Oncology

## 2022-10-26 DIAGNOSIS — Z510 Encounter for antineoplastic radiation therapy: Secondary | ICD-10-CM | POA: Diagnosis not present

## 2022-10-26 DIAGNOSIS — Z191 Hormone sensitive malignancy status: Secondary | ICD-10-CM | POA: Diagnosis not present

## 2022-10-26 LAB — RAD ONC ARIA SESSION SUMMARY
Course Elapsed Days: 35
Plan Fractions Treated to Date: 21
Plan Prescribed Dose Per Fraction: 2.5 Gy
Plan Total Fractions Prescribed: 28
Plan Total Prescribed Dose: 70 Gy
Reference Point Dosage Given to Date: 52.5 Gy
Reference Point Session Dosage Given: 2.5 Gy
Session Number: 21

## 2022-10-27 ENCOUNTER — Other Ambulatory Visit: Payer: Self-pay

## 2022-10-27 ENCOUNTER — Ambulatory Visit
Admission: RE | Admit: 2022-10-27 | Discharge: 2022-10-27 | Disposition: A | Discharge status: Home / Self Care | Payer: Medicare Other | Source: Ambulatory Visit | Attending: Radiation Oncology | Admitting: Radiation Oncology

## 2022-10-27 DIAGNOSIS — Z510 Encounter for antineoplastic radiation therapy: Secondary | ICD-10-CM | POA: Diagnosis not present

## 2022-10-27 DIAGNOSIS — Z191 Hormone sensitive malignancy status: Secondary | ICD-10-CM | POA: Diagnosis not present

## 2022-10-27 LAB — RAD ONC ARIA SESSION SUMMARY
Course Elapsed Days: 36
Plan Fractions Treated to Date: 22
Plan Prescribed Dose Per Fraction: 2.5 Gy
Plan Total Fractions Prescribed: 28
Plan Total Prescribed Dose: 70 Gy
Reference Point Dosage Given to Date: 55 Gy
Reference Point Session Dosage Given: 2.5 Gy
Session Number: 22

## 2022-10-28 ENCOUNTER — Ambulatory Visit
Admission: RE | Admit: 2022-10-28 | Discharge: 2022-10-28 | Disposition: A | Discharge status: Home / Self Care | Payer: Medicare Other | Source: Ambulatory Visit | Attending: Radiation Oncology | Admitting: Radiation Oncology

## 2022-10-28 ENCOUNTER — Other Ambulatory Visit: Payer: Self-pay

## 2022-10-28 ENCOUNTER — Ambulatory Visit: Payer: Medicare Other | Admitting: Urology

## 2022-10-28 ENCOUNTER — Ambulatory Visit: Payer: Medicare Other

## 2022-10-28 VITALS — BP 186/76 | HR 98

## 2022-10-28 DIAGNOSIS — Z191 Hormone sensitive malignancy status: Secondary | ICD-10-CM | POA: Diagnosis not present

## 2022-10-28 DIAGNOSIS — Z510 Encounter for antineoplastic radiation therapy: Secondary | ICD-10-CM | POA: Diagnosis not present

## 2022-10-28 DIAGNOSIS — R339 Retention of urine, unspecified: Secondary | ICD-10-CM

## 2022-10-28 DIAGNOSIS — C61 Malignant neoplasm of prostate: Secondary | ICD-10-CM | POA: Diagnosis not present

## 2022-10-28 LAB — RAD ONC ARIA SESSION SUMMARY
Course Elapsed Days: 37
Plan Fractions Treated to Date: 23
Plan Prescribed Dose Per Fraction: 2.5 Gy
Plan Total Fractions Prescribed: 28
Plan Total Prescribed Dose: 70 Gy
Reference Point Dosage Given to Date: 57.5 Gy
Reference Point Session Dosage Given: 2.5 Gy
Session Number: 23

## 2022-10-28 NOTE — Progress Notes (Unsigned)
10/28/2022 1:43 PM   [name] Jul 20, 1951 [id number]  Referring provider: Redmond School, MD [address] [place],  [place] [address]  No chief complaint on file.   HPI:  He has nocturia 1-2x. PVR 275cc. Urine stream intermittently weak.  He has 5 IMRT treatments remaining.   PMH: Past Medical History:  Diagnosis Date   Asthma    as child   Diabetes mellitus without complication ([place])    DLBCL (diffuse large B cell lymphoma) ([place]) 02/28/2009   Edema    Heart failure, diastolic, chronic ([place])    patient denies   Hyperlipidemia, mixed    Hypertension    IDA (iron deficiency anemia)    Large cell lymphoma ([place]) 01/2005   autologous stem cell transplant 12/2005   Obesity, morbid (more than 100 lbs over ideal weight or BMI > 40) ([place])    Venous insufficiency 04/29/2011    Surgical History: Past Surgical History:  Procedure Laterality Date   BACK SURGERY  2006   T6 vertebrae removed/titanium placed   CATARACT EXTRACTION W/PHACO Right 03/31/2019   Procedure: CATARACT EXTRACTION PHACO AND INTRAOCULAR LENS PLACEMENT ([place]);  Surgeon: Baruch Goldmann, MD;  [facility];  Service: Ophthalmology;  Laterality: Right;  CDE: 3.21   CATARACT EXTRACTION W/PHACO Left 04/14/2019   Procedure: CATARACT EXTRACTION PHACO AND INTRAOCULAR LENS PLACEMENT (IOC);  Surgeon: Baruch Goldmann, MD;  Location: [facility];  Service: Ophthalmology;  Laterality: Left;  left - pt knows to arrive at 7:45, CDE: 3.55   COLONOSCOPY  11/24/2004   Polyps in the left colon ablated/removed as described above.  Two submucosal lesions consistent with lipomas as described above not  manipulated./ Normal rectum   COLONOSCOPY  06/20/2012   MULTIPLE RECTAL AND COLONIC POLYPS   COLONOSCOPY N/A 03/08/2015   RMR: Capacious, redundant colon. Multiple colonic and rectosigmoid polyps removed. ablated as described above. colonic lipoma abnormal appearing terminal ileum likely a variant of normal). Howeverwith history  biopsies  obtained.    ESOPHAGOGASTRODUODENOSCOPY N/A 03/08/2015   RMR: Hiatal hernia Polypoid gastric mucosa with multiple gastric polyps. largest polyp removed via snare polypectomgy hemostasis clip placed at base. Status post gastric biopsy. Status post video capsule placement.    GIVENS CAPSULE STUDY N/A 03/08/2015   Procedure: GIVENS CAPSULE STUDY;  Surgeon: Daneil Dolin, MD;  [facility];  Service: Endoscopy;  Laterality: N/A;   GOLD SEED IMPLANT N/A 09/08/2022   Procedure: GOLD SEED IMPLANT;  Surgeon: Cleon Gustin, MD;  Location: [facility];  Service: Urology;  Laterality: N/A;   LIMBAL STEM CELL TRANSPLANT     LUNG BIOPSY  6/06   MULTIPLE TOOTH EXTRACTIONS  04/2005   port a cath placement     PORT-A-CATH REMOVAL  09/08/2012   Procedure: REMOVAL PORT-A-CATH;  Surgeon: Melrose Nakayama, MD;  Location: [facility];  Service: Thoracic;  Laterality: N/A;   SPACE OAR INSTILLATION N/A 09/08/2022   Procedure: SPACE OAR INSTILLATION;  Surgeon: Cleon Gustin, MD;  Location: [facility];  Service: Urology;  Laterality: N/A;    Home Medications:  Allergies as of 10/28/2022       Reactions   Penicillins Rash   Has patient had a PCN reaction causing immediate rash, facial/tongue/throat swelling, SOB or lightheadedness with hypotension: Unknown Has patient had a PCN reaction causing severe rash involving mucus membranes or skin necrosis: Unknown Has patient had a PCN reaction that required hospitalization: Unknown Has patient had a PCN reaction occurring within the last 10 years:  No If all of the above answers are "NO", then may proceed with Cephalosporin use.        Medication List        Accurate as of October 28, 2022  1:43 PM. If you have any questions, ask your nurse or doctor.          apixaban 5 MG Tabs tablet Commonly known as: Eliquis Take 1 tablet (5 mg total) by mouth 2 (two) times daily.   B-D ULTRAFINE III SHORT PEN 31G X 8 MM Misc Generic drug: Insulin Pen Needle USE  EVERY DAY   clotrimazole-betamethasone cream Commonly known as: LOTRISONE Apply 1 application topically 2 (two) times daily as needed (leg infection).   cyanocobalamin 1000 MCG tablet Commonly known as: VITAMIN B12 Take 2 tablets (2,000 mcg total) by mouth daily.   folic acid 1 MG tablet Commonly known as: FOLVITE TAKE 1 TABLET(1 MG) BY MOUTH DAILY   furosemide 20 MG tablet Commonly known as: LASIX Take 1 tablet (20 mg total) by mouth daily as needed for fluid or edema.   metFORMIN 500 MG tablet Commonly known as: GLUCOPHAGE TAKE 1 TABLET(500 MG) BY MOUTH TWICE DAILY WITH A MEAL   metoprolol tartrate 25 MG tablet Commonly known as: LOPRESSOR Take 1 tablet (25 mg total) by mouth 2 (two) times daily.   ONE TOUCH ULTRA 2 w/Device Kit USE TO TEST BLOOD SUGAR ONCE D UTD   OneTouch Delica Plus 0000000 Misc TEST TWICE DAILY   OneTouch Ultra test strip Generic drug: glucose blood TEST TWICE DAILY   potassium chloride 10 MEQ tablet Commonly known as: KLOR-CON Take 10 meq on days you need to take Lasix What changed: how much to take   rosuvastatin 40 MG tablet Commonly known as: CRESTOR Take 40 mg by mouth daily.   traMADol 50 MG tablet Commonly known as: Ultram Take 1 tablet (50 mg total) by mouth every 6 (six) hours as needed.   Tyler Aas FlexTouch 100 UNIT/ML FlexTouch Pen Generic drug: insulin degludec Inject 20 Units into the skin at bedtime.   Vitamin D (Ergocalciferol) 1.25 MG (50000 UNIT) Caps capsule Commonly known as: DRISDOL TAKE 1 CAPSULE BY MOUTH ONCE WEEKLY        Allergies:  Allergies  Allergen Reactions   Penicillins Rash    Has patient had a PCN reaction causing immediate rash, facial/tongue/throat swelling, SOB or lightheadedness with hypotension: Unknown Has patient had a PCN reaction causing severe rash involving mucus membranes or skin necrosis: Unknown Has patient had a PCN reaction that required hospitalization: Unknown Has patient had  a PCN reaction occurring within the last 10 years: No If all of the above answers are "NO", then may proceed with Cephalosporin use.     Family History: Family History  Problem Relation Age of Onset   Cancer Mother        lung   Hypertension Mother    Hyperlipidemia Mother    Cancer Father        prostate   Hypertension Father    Colon cancer Neg Hx    Diabetes Neg Hx     Social History:  reports that he quit smoking about 19 years ago. His smoking use included cigarettes. He has a 7.50 pack-year smoking history. He has never used smokeless tobacco. He reports that he does not drink alcohol and does not use drugs.  ROS: All other review of systems were reviewed and are negative except what is noted above in HPI  Physical  Exam: BP (!) 186/76   Pulse 98   Constitutional:  Alert and oriented, No acute distress. HEENT: [place] AT, moist mucus membranes.  Trachea midline, no masses. Cardiovascular: No clubbing, cyanosis, or edema. Respiratory: Normal respiratory effort, no increased work of breathing. GI: Abdomen is soft, nontender, nondistended, no abdominal masses GU: No CVA tenderness.  Lymph: No cervical or inguinal lymphadenopathy. Skin: No rashes, bruises or suspicious lesions. Neurologic: Grossly intact, no focal deficits, moving all 4 extremities. Psychiatric: Normal mood and affect.  Laboratory Data: Lab Results  Component Value Date   WBC 4.0 08/06/2022   HGB 11.7 (L) 08/06/2022   HCT 37.0 (L) 08/06/2022   MCV 98.4 08/06/2022   PLT 151 08/06/2022    Lab Results  Component Value Date   CREATININE 1.44 (H) 08/06/2022    No results found for: "PSA"  No results found for: "TESTOSTERONE"  Lab Results  Component Value Date   HGBA1C 4.9 08/11/2022    Urinalysis    Component Value Date/Time   COLORURINE AMBER (A) 08/02/2019 2010   APPEARANCEUR Clear 07/20/2022 1417   LABSPEC 1.018 08/02/2019 2010   PHURINE 5.0 08/02/2019 2010   GLUCOSEU Negative 07/20/2022  1417   HGBUR MODERATE (A) 08/02/2019 2010   BILIRUBINUR Negative 07/20/2022 1417   KETONESUR 5 (A) 08/02/2019 2010   PROTEINUR 3+ (A) 07/20/2022 1417   PROTEINUR 30 (A) 08/02/2019 2010   NITRITE Negative 07/20/2022 1417   NITRITE NEGATIVE 08/02/2019 2010   LEUKOCYTESUR Negative 07/20/2022 1417   LEUKOCYTESUR NEGATIVE 08/02/2019 2010    Lab Results  Component Value Date   LABMICR See below: 07/20/2022   WBCUA 0-5 07/20/2022   LABEPIT 0-10 07/20/2022   BACTERIA None seen 07/20/2022    Pertinent Imaging: *** No results found for this or any previous visit.  Results for orders placed during the hospital encounter of 10/24/08  US VenoUS Imaging Bilateral  Narrative Clinical Data: Elevated D-dimer  BILATERAL LOWER EXTREMITY VENOUS DUPLEX ULTRASOUND  Technique: Gray-scale sonography with graded compression, as well as color Doppler and duplex ultrasound, were performed to evaluate the deep venous system of both lower extremities from the level of the common femoral vein through the popliteal and proximal calf veins. Spectral Doppler was utilized to evaluate flow at rest and with distal augmentation maneuvers.  Comparison: None  Findings:  Normal compressibility of  bilateral common femoral, superficial femoral, and popliteal veins is demonstrated, as well as the visualized proximal calf veins.  No filling defects to suggest DVT on grayscale or color Doppler imaging.  Doppler waveforms show normal direction of venous flow, normal respiratory phasicity and response to augmentation.  IMPRESSION: No evidence of bilateral lower extremity deep vein thrombosis.  Provider: Eldridge Scot  No results found for this or any previous visit.  No results found for this or any previous visit.  Results for orders placed during the hospital encounter of 05/18/17  US RENAL  Narrative CLINICAL DATA:  Acute kidney injury. Admitted for pneumonia. History of hypertension,  lymphoma, diabetes.  EXAM: RENAL / URINARY TRACT ULTRASOUND COMPLETE  COMPARISON:  CT chest, abdomen and pelvis May 18, 2017  FINDINGS: Right Kidney:  Length: 12.8 cm. Echogenicity within normal limits. No mass or hydronephrosis visualized.  Left Kidney:  Length: 11 cm. Echogenicity within normal limits. No mass or hydronephrosis visualized.  Bladder:  Decompressed.  Incidental LEFT pleural effusion as seen on yesterday's CT chest.  IMPRESSION: Normal renal ultrasound.   Electronically Signed By: Elon Alas M.D. On: 05/19/2017  13:46  No valid procedures specified. No results found for this or any previous visit.  No results found for this or any previous visit.   Assessment & Plan:    1. Prostate cancer ([place]) -followup 3 months with PSA - Urinalysis, Routine w reflex microscopic - BLADDER SCAN AMB NON-IMAGING  2. Incomplete emptying of bladder -patient defers therapy at thist ime. Patient instructed to double void   No follow-ups on file.  Nicolette Bang, MD  [facility] [place]

## 2022-10-28 NOTE — Progress Notes (Unsigned)
post void residual= 275 ml

## 2022-10-29 ENCOUNTER — Other Ambulatory Visit: Payer: Self-pay

## 2022-10-29 ENCOUNTER — Ambulatory Visit
Admission: RE | Admit: 2022-10-29 | Discharge: 2022-10-29 | Disposition: A | Discharge status: Home / Self Care | Payer: Medicare Other | Source: Ambulatory Visit | Attending: Radiation Oncology | Admitting: Radiation Oncology

## 2022-10-29 ENCOUNTER — Ambulatory Visit: Payer: Medicare Other

## 2022-10-29 ENCOUNTER — Encounter: Payer: Self-pay | Admitting: Urology

## 2022-10-29 DIAGNOSIS — Z191 Hormone sensitive malignancy status: Secondary | ICD-10-CM | POA: Diagnosis not present

## 2022-10-29 DIAGNOSIS — Z510 Encounter for antineoplastic radiation therapy: Secondary | ICD-10-CM | POA: Diagnosis not present

## 2022-10-29 LAB — RAD ONC ARIA SESSION SUMMARY
Course Elapsed Days: 38
Plan Fractions Treated to Date: 24
Plan Prescribed Dose Per Fraction: 2.5 Gy
Plan Total Fractions Prescribed: 28
Plan Total Prescribed Dose: 70 Gy
Reference Point Dosage Given to Date: 60 Gy
Reference Point Session Dosage Given: 2.5 Gy
Session Number: 24

## 2022-10-29 NOTE — Patient Instructions (Signed)

## 2022-10-30 ENCOUNTER — Other Ambulatory Visit: Payer: Self-pay

## 2022-10-30 ENCOUNTER — Ambulatory Visit: Payer: Medicare Other

## 2022-10-30 ENCOUNTER — Ambulatory Visit
Admission: RE | Admit: 2022-10-30 | Discharge: 2022-10-30 | Disposition: A | Discharge status: Home / Self Care | Payer: Medicare Other | Source: Ambulatory Visit | Attending: Radiation Oncology | Admitting: Radiation Oncology

## 2022-10-30 DIAGNOSIS — Z191 Hormone sensitive malignancy status: Secondary | ICD-10-CM | POA: Diagnosis not present

## 2022-10-30 DIAGNOSIS — Z510 Encounter for antineoplastic radiation therapy: Secondary | ICD-10-CM | POA: Diagnosis not present

## 2022-10-30 LAB — RAD ONC ARIA SESSION SUMMARY
Course Elapsed Days: 39
Plan Fractions Treated to Date: 25
Plan Prescribed Dose Per Fraction: 2.5 Gy
Plan Total Fractions Prescribed: 28
Plan Total Prescribed Dose: 70 Gy
Reference Point Dosage Given to Date: 62.5 Gy
Reference Point Session Dosage Given: 2.5 Gy
Session Number: 25

## 2022-11-02 ENCOUNTER — Other Ambulatory Visit: Payer: Self-pay

## 2022-11-02 ENCOUNTER — Ambulatory Visit
Admission: RE | Admit: 2022-11-02 | Discharge: 2022-11-02 | Disposition: A | Discharge status: Home / Self Care | Payer: Medicare Other | Source: Ambulatory Visit | Attending: Radiation Oncology | Admitting: Radiation Oncology

## 2022-11-02 DIAGNOSIS — Z510 Encounter for antineoplastic radiation therapy: Secondary | ICD-10-CM | POA: Diagnosis not present

## 2022-11-02 DIAGNOSIS — Z191 Hormone sensitive malignancy status: Secondary | ICD-10-CM | POA: Diagnosis not present

## 2022-11-02 LAB — RAD ONC ARIA SESSION SUMMARY
Course Elapsed Days: 42
Plan Fractions Treated to Date: 26
Plan Prescribed Dose Per Fraction: 2.5 Gy
Plan Total Fractions Prescribed: 28
Plan Total Prescribed Dose: 70 Gy
Reference Point Dosage Given to Date: 65 Gy
Reference Point Session Dosage Given: 2.5 Gy
Session Number: 26

## 2022-11-03 ENCOUNTER — Other Ambulatory Visit: Payer: Self-pay

## 2022-11-03 ENCOUNTER — Ambulatory Visit
Admission: RE | Admit: 2022-11-03 | Discharge: 2022-11-03 | Disposition: A | Discharge status: Home / Self Care | Payer: Medicare Other | Source: Ambulatory Visit | Attending: Radiation Oncology | Admitting: Radiation Oncology

## 2022-11-03 DIAGNOSIS — Z510 Encounter for antineoplastic radiation therapy: Secondary | ICD-10-CM | POA: Diagnosis not present

## 2022-11-03 DIAGNOSIS — Z191 Hormone sensitive malignancy status: Secondary | ICD-10-CM | POA: Diagnosis not present

## 2022-11-03 LAB — RAD ONC ARIA SESSION SUMMARY
Course Elapsed Days: 43
Plan Fractions Treated to Date: 27
Plan Prescribed Dose Per Fraction: 2.5 Gy
Plan Total Fractions Prescribed: 28
Plan Total Prescribed Dose: 70 Gy
Reference Point Dosage Given to Date: 67.5 Gy
Reference Point Session Dosage Given: 2.5 Gy
Session Number: 27

## 2022-11-04 ENCOUNTER — Other Ambulatory Visit: Payer: Self-pay

## 2022-11-04 ENCOUNTER — Encounter: Payer: Self-pay | Admitting: Urology

## 2022-11-04 ENCOUNTER — Ambulatory Visit
Admission: RE | Admit: 2022-11-04 | Discharge: 2022-11-04 | Disposition: A | Discharge status: Home / Self Care | Payer: Medicare Other | Source: Ambulatory Visit | Attending: Radiation Oncology | Admitting: Radiation Oncology

## 2022-11-04 DIAGNOSIS — Z191 Hormone sensitive malignancy status: Secondary | ICD-10-CM | POA: Diagnosis not present

## 2022-11-04 DIAGNOSIS — Z510 Encounter for antineoplastic radiation therapy: Secondary | ICD-10-CM | POA: Diagnosis not present

## 2022-11-04 LAB — RAD ONC ARIA SESSION SUMMARY
Course Elapsed Days: 44
Plan Fractions Treated to Date: 28
Plan Prescribed Dose Per Fraction: 2.5 Gy
Plan Total Fractions Prescribed: 28
Plan Total Prescribed Dose: 70 Gy
Reference Point Dosage Given to Date: 70 Gy
Reference Point Session Dosage Given: 2.5 Gy
Session Number: 28

## 2022-11-05 DIAGNOSIS — C61 Malignant neoplasm of prostate: Secondary | ICD-10-CM

## 2022-11-05 NOTE — Progress Notes (Signed)
Patient was a RadOnc Consult on 07/23/2022 for his stage T1c adenocarcinoma of the prostate with Gleason score of 4+3, and PSA of 5.3. Patient proceed with treatment recommendations of 5.5 weeks of external beam therapy without ADT and had his final radiation treatment on 11/04/2022.   Patient is scheduled for a post treatment nurse call on 12/08/2022 and has his first post treatment PSA on 02/03/2023 with Dr. Alyson Ingles.

## 2022-11-10 NOTE — Progress Notes (Signed)
RN attempted to contact patient since completing treatment to review any navigation needs.   No answer, no option to leave voicemail.  Will attempt again at a later time.

## 2022-11-12 ENCOUNTER — Other Ambulatory Visit: Payer: Self-pay | Admitting: *Deleted

## 2022-11-12 DIAGNOSIS — I872 Venous insufficiency (chronic) (peripheral): Secondary | ICD-10-CM

## 2022-11-13 NOTE — Progress Notes (Signed)
Patient was a RadOnc Consult on 07/23/2022 for his stage T1c adenocarcinoma of the prostate with Gleason score of 4+3, and PSA of 5.3. Patient proceed with treatment recommendations of 5.5 weeks of external beam therapy without ADT and had his final radiation treatment on 11/04/2022.   Patient is scheduled for a post treatment nurse call on 12/08/2022 and has his first post treatment PSA on 02/03/2023 and visit with Dr. Alyson Ingles.  RN spoke with patient and reviewed upcoming appointments and educated on importance of PSA monitoring through urology.  Verbalized understanding and agreement.

## 2022-11-19 ENCOUNTER — Encounter (HOSPITAL_COMMUNITY): Payer: Self-pay | Admitting: Hematology

## 2022-11-24 ENCOUNTER — Other Ambulatory Visit: Payer: Self-pay | Admitting: Nurse Practitioner

## 2022-11-26 ENCOUNTER — Ambulatory Visit (HOSPITAL_COMMUNITY): Payer: Medicare Other

## 2022-11-26 ENCOUNTER — Ambulatory Visit: Payer: Medicare Other | Admitting: Vascular Surgery

## 2022-12-08 ENCOUNTER — Ambulatory Visit
Admission: RE | Admit: 2022-12-08 | Discharge: 2022-12-08 | Disposition: A | Discharge status: Home / Self Care | Payer: Medicare Other | Source: Ambulatory Visit | Attending: Radiation Oncology | Admitting: Radiation Oncology

## 2022-12-08 NOTE — Progress Notes (Signed)
  Radiation Oncology         [phone] ________________________________  Name: [name] MRN: [id number]  Date of Service: 12/08/2022  DOB: [birth year]  Post Treatment Telephone Note  Diagnosis:  Malignant neoplasm of prostate (HCC) (as documented in provider EOT note)   Pre Treatment IPSS Score: 9 (as documented in the provider consult note)   The patient was available for call today.   Symptoms of fatigue have improved since completing therapy.  Symptoms of bladder changes have improved since completing therapy. Current symptoms include none, and medications for bladder symptoms include none.  Symptoms of bowel changes have improved since completing therapy. Current symptoms include none, and medications for bowel symptoms include none.     Post Treatment IPSS Score: IPSS Questionnaire (AUA-7): Over the past month.   1)  How often have you had a sensation of not emptying your bladder completely after you finish urinating?  2 - Less than half the time  2)  How often have you had to urinate again less than two hours after you finished urinating? 2 - Less than half the time  3)  How often have you found you stopped and started again several times when you urinated?  1 - Less than 1 time in 5  4) How difficult have you found it to postpone urination?  1 - Less than 1 time in 5  5) How often have you had a weak urinary stream?  3 - About half the time  6) How often have you had to push or strain to begin urination?  2 - Less than half the time  7) How many times did you most typically get up to urinate from the time you went to bed until the time you got up in the morning?  2 - 2 times  Total score:  13. Which indicates moderate symptoms  0-7 mildly symptomatic   8-19 moderately symptomatic   20-35 severely symptomatic    Patient states "He has self-acquired a new urologist and does not know the urologist's name or any of the office information." He was counseled that PSA  levels will be drawn in the urology office, and was reassured that additional time is expected to improve bowel and bladder symptoms. He was encouraged to call back with concerns or questions regarding radiation.  This concludes the interview.   Ruel Favors, LPN

## 2022-12-11 NOTE — Progress Notes (Signed)
                                                                                                                                                             Patient Name: [name] MRN: [id number] DOB: 07/03/1951 Referring Physician: Di Kindle Date of Service: 11/04/2022 [facility], [place]                                                        End Of Treatment Note  Diagnoses: C61-Malignant neoplasm of prostate  Cancer Staging: 72 y.o. gentleman with Stage T1c adenocarcinoma of the prostate with Gleason score of 4+3, and PSA of 5.3.   Intent: Curative  Radiation Treatment Dates: 09/21/2022 through 11/04/2022 Site Technique Total Dose (Gy) Dose per Fx (Gy) Completed Fx Beam Energies  Prostate: Prostate IMRT 70/70 2.5 28/28 6X   Narrative: The patient tolerated radiation therapy relatively well.  He reported modest fatigue, increased nocturia, increased urinary frequency and occasional dysuria at the start of his stream.  He denied any gross hematuria, straining to void, abdominal pain or diarrhea.  Plan: The patient will receive a call in about one month from the radiation oncology department. He will continue follow up with his urologist, Dr. Ronne Binning, as well.  ------------------------------------------------   Margaretmary Dys, MD [facility]  Radiation Oncology Direct Dial: [phone]  Fax: [phone] [email or web address]  Skype  LinkedIn

## 2022-12-21 ENCOUNTER — Inpatient Hospital Stay: Payer: Medicare Other | Attending: Adult Health | Admitting: *Deleted

## 2022-12-21 ENCOUNTER — Encounter: Payer: Self-pay | Admitting: *Deleted

## 2022-12-21 ENCOUNTER — Telehealth: Payer: Self-pay

## 2022-12-21 DIAGNOSIS — C61 Malignant neoplasm of prostate: Secondary | ICD-10-CM

## 2022-12-21 NOTE — Progress Notes (Signed)
SCP reviewed and completed. 

## 2022-12-21 NOTE — Telephone Encounter (Signed)
Called patient after speaking with Nateo from the cancer center. Patient is aware to get PSA labs prior to office visit. PSA lab appointment has been made for patient.  Reminder have been sent to patient for both office visit and labs.Patient voiced understanding

## 2023-01-07 ENCOUNTER — Ambulatory Visit (HOSPITAL_COMMUNITY)
Admission: RE | Admit: 2023-01-07 | Discharge: 2023-01-07 | Disposition: A | Discharge status: Home / Self Care | Payer: Medicare Other | Source: Ambulatory Visit | Attending: Vascular Surgery | Admitting: Vascular Surgery

## 2023-01-07 ENCOUNTER — Encounter (HOSPITAL_COMMUNITY): Payer: Self-pay | Admitting: Hematology

## 2023-01-07 ENCOUNTER — Ambulatory Visit: Payer: Medicare Other | Admitting: Vascular Surgery

## 2023-01-07 ENCOUNTER — Encounter: Payer: Self-pay | Admitting: Vascular Surgery

## 2023-01-07 VITALS — BP 146/87 | HR 79 | Temp 98.0°F | Resp 20 | Ht 71.0 in | Wt 230.0 lb

## 2023-01-07 DIAGNOSIS — I872 Venous insufficiency (chronic) (peripheral): Secondary | ICD-10-CM

## 2023-01-07 MED ORDER — HEPARIN SODIUM (PORCINE) 1000 UNIT/ML IJ SOLN
INTRAMUSCULAR | Status: AC
  Filled 2023-01-07: qty 10

## 2023-01-07 MED ORDER — LIDOCAINE HCL (PF) 1 % IJ SOLN
INTRAMUSCULAR | Status: AC
  Filled 2023-01-07: qty 30

## 2023-01-07 NOTE — Progress Notes (Addendum)
REASON FOR VISIT:   Follow-up of combined chronic venous insufficiency and lymphedema.  MEDICAL ISSUES:   COMBINED CHRONIC VENOUS INSUFFICIENCY AND LYMPHEDEMA: This patient has profound swelling in both lower extremities.  He has hyperkeratosis and lymphorrhea bilaterally.  He has significant hyperpigmentation.  Based on his exam he has evidence of combined chronic venous insufficiency and lymphedema.  He does have deep and superficial venous reflux on the left.  It looks like he may be a candidate at some point for laser ablation of the left small saphenous vein, however currently his skin is so compromised I do not think this could be done until the swelling and induration are under better control.  He has been elevating his legs intermittently during the day but I think he needs to be much more aggressive with this.  He has been using compressive therapy also.  He tries to exercise but this is somewhat limited by the swelling. He has tried 4 weeks of elevation, exercise, and compression which have not resulted in significant reduction in swelling.  He is also undergoing radiation therapy for prostate cancer.  I encouraged him to avoid prolonged sitting and standing.  Currently he has CEAP C5 venous disease (healed ulcers).  However I think he is at very high risk for recurrent ulceration given the severity of his swelling.  I had a long discussion with him about the importance of spending lots of time with his legs elevated and we have discussed the proper positioning for this.  In addition I have recommended that he obtain a BioTAB half leg device bilaterally so that he can properly elevate his legs while he is using the pneumatic compression device.  I plan on seeing him back in 3 months.  Will get follow-up venous reflux testing on the right as it has been well over a year since his last study on the right.    HPI:   [name]. is a pleasant 72 y.o. male who I last saw on 09/03/2021 with  combined chronic venous insufficiency and lymphedema.  We have discussed conservative measures previously.  He does try to elevate his legs intermittently during the day.  Has been trying to exercise but his activity is somewhat limited by his leg swelling.  He does not use compressive therapy.  He wears compression stockings.  He denies any previous history of DVT.  Recently he has noted significant seepage of fluid from both legs but more significantly on the left side.  He has not noted any recent ulcers.  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   autologous stem cell transplant 12/2005   Obesity, morbid (more than 100 lbs over ideal weight or BMI > 40) (HCC)    Venous insufficiency 04/29/2011    Family History  Problem Relation Age of Onset   Cancer Mother        lung   Hypertension Mother    Hyperlipidemia Mother    Cancer Father        prostate   Hypertension Father    Colon cancer Neg Hx    Diabetes Neg  Hx     SOCIAL HISTORY: Social History   Tobacco Use   Smoking status: Former    Packs/day: 0.50    Years: 15.00    Additional pack years: 0.00    Total pack years: 7.50    Types: Cigarettes    Quit date: 11/11/2002    Years since quitting: 20.1   Smokeless tobacco: Never  Substance Use Topics   Alcohol use: No    Alcohol/week: 0.0 standard drinks of alcohol    Allergies  Allergen Reactions   Penicillins Rash    Has patient had a PCN reaction causing immediate rash, facial/tongue/throat swelling, SOB or lightheadedness with hypotension: Unknown Has patient had a PCN reaction causing severe rash involving mucus membranes or skin necrosis: Unknown Has patient had a  PCN reaction that required hospitalization: Unknown Has patient had a PCN reaction occurring within the last 10 years: No If all of the above answers are "NO", then may proceed with Cephalosporin use.     Current Outpatient Medications  Medication Sig Dispense Refill   apixaban (ELIQUIS) 5 MG TABS tablet Take 1 tablet (5 mg total) by mouth 2 (two) times daily. 60 tablet 11   B-D ULTRAFINE III SHORT PEN 31G X 8 MM MISC USE EVERY DAY 100 each 3   Blood Glucose Monitoring Suppl (ONE TOUCH ULTRA 2) w/Device KIT USE TO TEST BLOOD SUGAR ONCE D UTD     folic acid (FOLVITE) 1 MG tablet TAKE 1 TABLET(1 MG) BY MOUTH DAILY 30 tablet 4   furosemide (LASIX) 20 MG tablet Take 1 tablet (20 mg total) by mouth daily as needed for fluid or edema. 90 tablet 2   insulin degludec (TRESIBA FLEXTOUCH) 100 UNIT/ML FlexTouch Pen Inject 20 Units into the skin at bedtime. 18 mL 3   Lancets (ONETOUCH DELICA PLUS LANCET33G) MISC USE TO TEST TWICE DAILY 300 each 3   metFORMIN (GLUCOPHAGE) 500 MG tablet TAKE 1 TABLET(500 MG) BY MOUTH TWICE DAILY WITH A MEAL 180 tablet 3   ONETOUCH ULTRA test strip TEST TWICE DAILY 100 strip 3   potassium chloride (KLOR-CON) 10 MEQ tablet Take 10 meq on days you need to take Lasix (Patient taking differently: 10 mEq. Take 10 meq on days you need to take Lasix) 90 tablet 1   rosuvastatin (CRESTOR) 40 MG tablet Take 40 mg by mouth daily.     vitamin B-12 (CYANOCOBALAMIN) 1000 MCG tablet Take 2 tablets (2,000 mcg total) by mouth daily. 90 tablet 3   Vitamin D, Ergocalciferol, (DRISDOL) 1.25 MG (50000 UNIT) CAPS capsule TAKE 1 CAPSULE BY MOUTH ONCE WEEKLY 16 capsule 3   clotrimazole-betamethasone (LOTRISONE) cream Apply 1 application topically 2 (two) times daily as needed (leg infection).  (Patient not taking: Reported on 12/21/2022)     metoprolol tartrate (LOPRESSOR) 25 MG tablet Take 1 tablet (25 mg total) by mouth 2 (two) times daily. 180 tablet 3   No current facility-administered  medications for this visit.    REVIEW OF SYSTEMS:  [X]  denotes positive finding, [ ]  denotes negative finding Cardiac  Comments:  Chest pain or chest pressure:    Shortness of breath upon exertion:    Short of breath when lying flat:    Irregular heart rhythm:        Vascular    Pain in calf, thigh, or hip brought on by ambulation:    Pain in feet at night that wakes you up from your sleep:     Blood clot in your  veins:    Leg swelling:  x       Pulmonary    Oxygen at home:    Productive cough:     Wheezing:         Neurologic    Sudden weakness in arms or legs:     Sudden numbness in arms or legs:     Sudden onset of difficulty speaking or slurred speech:    Temporary loss of vision in one eye:     Problems with dizziness:         Gastrointestinal    Blood in stool:     Vomited blood:         Genitourinary    Burning when urinating:     Blood in urine:        Psychiatric    Major depression:         Hematologic    Bleeding problems:    Problems with blood clotting too easily:        Skin    Rashes or ulcers:        Constitutional    Fever or chills:     PHYSICAL EXAM:   Vitals:   01/07/23 1116  BP: (!) 146/87  Pulse: 79  Resp: 20  Temp: 98 F (36.7 C)  SpO2: 98%  Weight: 230 lb (104.3 kg)  Height: 5\' 11"  (1.803 m)    GENERAL: The patient is a well-nourished male, in no acute distress. The vital signs are documented above. CARDIAC: There is a regular rate and rhythm.  VASCULAR: He has a biphasic dorsalis pedis and posterior tibial signal bilaterally.  It is difficult to palpate pedal pulses because of his significant leg swelling. He has extensive bilateral lower extremity swelling with lymphorrhea and significant hyperkeratosis.  He has hyperpigmentation bilaterally also.     PULMONARY: There is good air exchange bilaterally without wheezing or rales. ABDOMEN: Soft and non-tender with normal pitched bowel sounds.  MUSCULOSKELETAL: There are  no major deformities or cyanosis. NEUROLOGIC: No focal weakness or paresthesias are detected. SKIN: There are no ulcers or rashes noted. PSYCHIATRIC: The patient has a normal affect.  DATA:    VENOUS DUPLEX: I have independently interpreted his venous duplex scan today.  This was of the left lower extremity only.  There was no evidence of DVT.  There was deep venous reflux in the common femoral vein, femoral vein, and popliteal vein.  There was reflux in the great saphenous vein from the saphenofemoral junction to the mid thigh but the vein was fairly small with diameters ranging from 3-4.4 mm.  The vein exited the fascia at this level.  There was reflux in the small saphenous vein from the saphenous popliteal junction to the mid calf.  Diameters ranged from 5.7-6 mm.  The results of the study are summarized in the diagram below.    Waverly Ferrari [facility] [phone]

## 2023-01-14 ENCOUNTER — Other Ambulatory Visit: Payer: Self-pay

## 2023-01-14 DIAGNOSIS — I872 Venous insufficiency (chronic) (peripheral): Secondary | ICD-10-CM

## 2023-01-27 ENCOUNTER — Other Ambulatory Visit: Payer: Medicare Other

## 2023-01-27 DIAGNOSIS — C61 Malignant neoplasm of prostate: Secondary | ICD-10-CM

## 2023-01-28 LAB — PSA: Prostate Specific Ag, Serum: 1.6 ng/mL (ref 0.0–4.0)

## 2023-02-03 ENCOUNTER — Ambulatory Visit: Payer: Medicare Other | Admitting: Urology

## 2023-02-03 VITALS — BP 112/68 | HR 128 | Ht 71.0 in | Wt 230.0 lb

## 2023-02-03 DIAGNOSIS — R339 Retention of urine, unspecified: Secondary | ICD-10-CM | POA: Diagnosis not present

## 2023-02-03 DIAGNOSIS — C61 Malignant neoplasm of prostate: Secondary | ICD-10-CM

## 2023-02-03 NOTE — Progress Notes (Signed)
02/03/2023 10:21 AM   [name] Dec 21, 1950 [id number]  Referring provider: [name], MD [address] [place],  [place] [address]  Followup prostate cancer and BPH   HPI: [name] is a 72yo here for followup for prostate cancer and BPH. PSA decreased to 1.6. IPSS 10 QOl 2 on no therapy. Urine stream has improved since last visit. Nocturia 1x. No other complaints today   PMH: 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   autologous stem cell transplant 12/2005   Obesity, morbid (more than 100 lbs over ideal weight or BMI > 40) (HCC)    Venous insufficiency 04/29/2011    Surgical History: Past Surgical History:  Procedure Laterality Date   BACK SURGERY  2006   T6 vertebrae removed/titanium placed   CATARACT EXTRACTION W/PHACO Right 03/31/2019   Procedure: CATARACT EXTRACTION PHACO AND INTRAOCULAR LENS PLACEMENT (IOC);  Surgeon: Fabio Pierce, MD;  Location: [facility];  Service: Ophthalmology;  Laterality: Right;  CDE: 3.21   CATARACT EXTRACTION W/PHACO Left 04/14/2019   Procedure: CATARACT EXTRACTION PHACO AND INTRAOCULAR LENS PLACEMENT (IOC);  Surgeon: Fabio Pierce, MD;  Location: [facility];  Service: Ophthalmology;  Laterality: Left;  left - pt knows to arrive at 7:45, CDE: 3.55   COLONOSCOPY  11/24/2004   Polyps in the left colon ablated/removed as described above.  Two submucosal lesions consistent with lipomas as described above not  manipulated./ Normal rectum   COLONOSCOPY  06/20/2012   MULTIPLE RECTAL AND COLONIC POLYPS   COLONOSCOPY N/A 03/08/2015   RMR: Capacious, redundant colon. Multiple colonic and rectosigmoid polyps removed. ablated as described above.  colonic lipoma abnormal appearing terminal ileum likely a variant of normal). Howeverwith history  biopsies obtained.    ESOPHAGOGASTRODUODENOSCOPY N/A 03/08/2015   RMR: Hiatal hernia Polypoid gastric mucosa with multiple gastric polyps. largest polyp removed via snare polypectomgy hemostasis clip placed at base. Status post gastric biopsy. Status post video capsule placement.    GIVENS CAPSULE STUDY N/A 03/08/2015   Procedure: GIVENS CAPSULE STUDY;  Surgeon: Corbin Ade, MD;  Location: [facility];  Service: Endoscopy;  Laterality: N/A;   GOLD SEED IMPLANT N/A 09/08/2022   Procedure: GOLD SEED IMPLANT;  Surgeon: Malen Gauze, MD;  Location: [facility];  Service: Urology;  Laterality: N/A;   LIMBAL STEM CELL TRANSPLANT     LUNG BIOPSY  6/06   MULTIPLE TOOTH EXTRACTIONS  04/2005   port a cath placement     PORT-A-CATH REMOVAL  09/08/2012   Procedure: REMOVAL PORT-A-CATH;  Surgeon: Loreli Slot, MD;  Location: [facility] OR;  Service: Thoracic;  Laterality: N/A;   SPACE OAR INSTILLATION N/A 09/08/2022   Procedure: SPACE OAR INSTILLATION;  Surgeon: Malen Gauze, MD;  Location: [facility];  Service: Urology;  Laterality: N/A;    Home Medications:  Allergies as of 02/03/2023       Reactions   Penicillins Rash   Has patient had a PCN reaction causing immediate rash, facial/tongue/throat swelling, SOB or lightheadedness with hypotension: Unknown Has patient had a PCN reaction causing severe rash involving mucus membranes or skin necrosis: Unknown Has patient  had a PCN reaction that required hospitalization: Unknown Has patient had a PCN reaction occurring within the last 10 years: No If all of the above answers are "NO", then may proceed with Cephalosporin use.        Medication List        Accurate as of February 03, 2023 10:21 AM. If you have any questions, ask your nurse or doctor.          apixaban 5 MG Tabs tablet Commonly known as: Eliquis Take 1 tablet (5 mg total) by mouth  2 (two) times daily.   B-D ULTRAFINE III SHORT PEN 31G X 8 MM Misc Generic drug: Insulin Pen Needle USE EVERY DAY   clotrimazole-betamethasone cream Commonly known as: LOTRISONE Apply 1 application  topically 2 (two) times daily as needed (leg infection).   cyanocobalamin 1000 MCG tablet Commonly known as: VITAMIN B12 Take 2 tablets (2,000 mcg total) by mouth daily.   folic acid 1 MG tablet Commonly known as: FOLVITE TAKE 1 TABLET(1 MG) BY MOUTH DAILY   furosemide 20 MG tablet Commonly known as: LASIX Take 1 tablet (20 mg total) by mouth daily as needed for fluid or edema.   metFORMIN 500 MG tablet Commonly known as: GLUCOPHAGE TAKE 1 TABLET(500 MG) BY MOUTH TWICE DAILY WITH A MEAL   metoprolol tartrate 25 MG tablet Commonly known as: LOPRESSOR Take 1 tablet (25 mg total) by mouth 2 (two) times daily.   ONE TOUCH ULTRA 2 w/Device Kit USE TO TEST BLOOD SUGAR ONCE D UTD   OneTouch Delica Plus Lancet33G Misc USE TO TEST TWICE DAILY   OneTouch Ultra test strip Generic drug: glucose blood TEST TWICE DAILY   potassium chloride 10 MEQ tablet Commonly known as: KLOR-CON Take 10 meq on days you need to take Lasix What changed: how much to take   rosuvastatin 40 MG tablet Commonly known as: CRESTOR Take 40 mg by mouth daily.   Evaristo Bury FlexTouch 100 UNIT/ML FlexTouch Pen Generic drug: insulin degludec Inject 20 Units into the skin at bedtime.   Vitamin D (Ergocalciferol) 1.25 MG (50000 UNIT) Caps capsule Commonly known as: DRISDOL TAKE 1 CAPSULE BY MOUTH ONCE WEEKLY        Allergies:  Allergies  Allergen Reactions   Penicillins Rash    Has patient had a PCN reaction causing immediate rash, facial/tongue/throat swelling, SOB or lightheadedness with hypotension: Unknown Has patient had a PCN reaction causing severe rash involving mucus membranes or skin necrosis: Unknown Has patient had a PCN reaction that required hospitalization: Unknown Has patient had a PCN  reaction occurring within the last 10 years: No If all of the above answers are "NO", then may proceed with Cephalosporin use.     Family History: Family History  Problem Relation Age of Onset   Cancer Mother        lung   Hypertension Mother    Hyperlipidemia Mother    Cancer Father        prostate   Hypertension Father    Colon cancer Neg Hx    Diabetes Neg Hx     Social History:  reports that he quit smoking about 20 years ago. His smoking use included cigarettes. He has a 7.50 pack-year smoking history. He has never used smokeless tobacco. He reports that he does not drink alcohol and does not use drugs.  ROS: All other review of systems were reviewed and are negative except what is noted above in HPI  Physical Exam: BP  112/68   Pulse (!) 128   Ht 5\' 11"  (1.803 m)   Wt 230 lb (104.3 kg)   BMI 32.08 kg/m   Constitutional:  Alert and oriented, No acute distress. HEENT: [place] AT, moist mucus membranes.  Trachea midline, no masses. Cardiovascular: No clubbing, cyanosis, or edema. Respiratory: Normal respiratory effort, no increased work of breathing. GI: Abdomen is soft, nontender, nondistended, no abdominal masses GU: No CVA tenderness.  Lymph: No cervical or inguinal lymphadenopathy. Skin: No rashes, bruises or suspicious lesions. Neurologic: Grossly intact, no focal deficits, moving all 4 extremities. Psychiatric: Normal mood and affect.  Laboratory Data: Lab Results  Component Value Date   WBC 4.0 08/06/2022   HGB 11.7 (L) 08/06/2022   HCT 37.0 (L) 08/06/2022   MCV 98.4 08/06/2022   PLT 151 08/06/2022    Lab Results  Component Value Date   CREATININE 1.44 (H) 08/06/2022    No results found for: "PSA"  No results found for: "TESTOSTERONE"  Lab Results  Component Value Date   HGBA1C 4.9 08/11/2022    Urinalysis    Component Value Date/Time   COLORURINE AMBER (A) 08/02/2019 2010   APPEARANCEUR Clear 07/20/2022 1417   LABSPEC 1.018 08/02/2019 2010    PHURINE 5.0 08/02/2019 2010   GLUCOSEU Negative 07/20/2022 1417   HGBUR MODERATE (A) 08/02/2019 2010   BILIRUBINUR Negative 07/20/2022 1417   KETONESUR 5 (A) 08/02/2019 2010   PROTEINUR 3+ (A) 07/20/2022 1417   PROTEINUR 30 (A) 08/02/2019 2010   NITRITE Negative 07/20/2022 1417   NITRITE NEGATIVE 08/02/2019 2010   LEUKOCYTESUR Negative 07/20/2022 1417   LEUKOCYTESUR NEGATIVE 08/02/2019 2010    Lab Results  Component Value Date   LABMICR See below: 07/20/2022   WBCUA 0-5 07/20/2022   LABEPIT 0-10 07/20/2022   BACTERIA None seen 07/20/2022    Pertinent Imaging:  No results found for this or any previous visit.  Results for orders placed during the hospital encounter of 10/24/08  US VenoUS Imaging Bilateral  Narrative Clinical Data: Elevated D-dimer  BILATERAL LOWER EXTREMITY VENOUS DUPLEX ULTRASOUND  Technique: Gray-scale sonography with graded compression, as well as color Doppler and duplex ultrasound, were performed to evaluate the deep venous system of both lower extremities from the level of the common femoral vein through the popliteal and proximal calf veins. Spectral Doppler was utilized to evaluate flow at rest and with distal augmentation maneuvers.  Comparison: None  Findings:  Normal compressibility of  bilateral common femoral, superficial femoral, and popliteal veins is demonstrated, as well as the visualized proximal calf veins.  No filling defects to suggest DVT on grayscale or color Doppler imaging.  Doppler waveforms show normal direction of venous flow, normal respiratory phasicity and response to augmentation.  IMPRESSION: No evidence of bilateral lower extremity deep vein thrombosis.  Provider: Morley Kos  No results found for this or any previous visit.  No results found for this or any previous visit.  Results for orders placed during the hospital encounter of 05/18/17  US RENAL  Narrative CLINICAL DATA:  Acute kidney  injury. Admitted for pneumonia. History of hypertension, lymphoma, diabetes.  EXAM: RENAL / URINARY TRACT ULTRASOUND COMPLETE  COMPARISON:  CT chest, abdomen and pelvis May 18, 2017  FINDINGS: Right Kidney:  Length: 12.8 cm. Echogenicity within normal limits. No mass or hydronephrosis visualized.  Left Kidney:  Length: 11 cm. Echogenicity within normal limits. No mass or hydronephrosis visualized.  Bladder:  Decompressed.  Incidental LEFT pleural effusion as seen on yesterday's CT  chest.  IMPRESSION: Normal renal ultrasound.   Electronically Signed By: Awilda Metro M.D. On: 05/19/2017 13:46  No valid procedures specified. No results found for this or any previous visit.  No results found for this or any previous visit.   Assessment & Plan:    1. Prostate cancer (HCC) -followup 3 months with PSA  2. Incomplete emptying of bladder -resolved   No follow-ups on file.  Wilkie Aye, MD  [facility]

## 2023-02-03 NOTE — Patient Instructions (Signed)

## 2023-02-09 ENCOUNTER — Encounter: Payer: Self-pay | Admitting: Urology

## 2023-02-10 ENCOUNTER — Ambulatory Visit: Payer: Medicare Other | Admitting: Nurse Practitioner

## 2023-02-10 ENCOUNTER — Encounter: Payer: Self-pay | Admitting: Nurse Practitioner

## 2023-02-10 VITALS — BP 113/74 | HR 76 | Ht 71.0 in | Wt 225.8 lb

## 2023-02-10 DIAGNOSIS — E782 Mixed hyperlipidemia: Secondary | ICD-10-CM

## 2023-02-10 DIAGNOSIS — Z794 Long term (current) use of insulin: Secondary | ICD-10-CM | POA: Diagnosis not present

## 2023-02-10 DIAGNOSIS — E1122 Type 2 diabetes mellitus with diabetic chronic kidney disease: Secondary | ICD-10-CM

## 2023-02-10 DIAGNOSIS — N1831 Chronic kidney disease, stage 3a: Secondary | ICD-10-CM

## 2023-02-10 DIAGNOSIS — I10 Essential (primary) hypertension: Secondary | ICD-10-CM | POA: Diagnosis not present

## 2023-02-10 DIAGNOSIS — Z7984 Long term (current) use of oral hypoglycemic drugs: Secondary | ICD-10-CM

## 2023-02-10 DIAGNOSIS — E559 Vitamin D deficiency, unspecified: Secondary | ICD-10-CM | POA: Diagnosis not present

## 2023-02-10 LAB — POCT GLYCOSYLATED HEMOGLOBIN (HGB A1C): Hemoglobin A1C: 5.5 % (ref 4.0–5.6)

## 2023-02-10 NOTE — Progress Notes (Signed)
02/10/2023, 9:55 AM  Endocrinology follow-up note     Subjective:    Patient ID: Frank Leff., male    DOB: 03-15-1951.  Frank Leff. is being seen in follow-up after he was seen in consultation for management of currently uncontrolled symptomatic diabetes requested by  [name], MD.   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   autologous stem cell transplant 12/2005   Obesity, morbid (more than 100 lbs over ideal weight or BMI > 40) (HCC)    Venous insufficiency 04/29/2011    Past Surgical History:  Procedure Laterality Date   BACK SURGERY  2006   T6 vertebrae removed/titanium placed   CATARACT EXTRACTION W/PHACO Right 03/31/2019   Procedure: CATARACT EXTRACTION PHACO AND INTRAOCULAR LENS PLACEMENT (IOC);  Surgeon: Fabio Pierce, MD;  Location: [facility];  Service: Ophthalmology;  Laterality: Right;  CDE: 3.21   CATARACT EXTRACTION W/PHACO Left 04/14/2019   Procedure: CATARACT EXTRACTION PHACO AND INTRAOCULAR LENS PLACEMENT (IOC);  Surgeon: Fabio Pierce, MD;  Location: [facility];  Service: Ophthalmology;  Laterality: Left;  left - pt knows to arrive at 7:45, CDE: 3.55   COLONOSCOPY  11/24/2004   Polyps in the left colon ablated/removed as described above.  Two submucosal lesions consistent with lipomas as described above not  manipulated./ Normal rectum   COLONOSCOPY  06/20/2012   MULTIPLE RECTAL AND COLONIC POLYPS   COLONOSCOPY N/A 03/08/2015   RMR: Capacious, redundant colon. Multiple colonic and rectosigmoid polyps removed. ablated as described above. colonic lipoma abnormal appearing terminal ileum  likely a variant of normal). Howeverwith history  biopsies obtained.    ESOPHAGOGASTRODUODENOSCOPY N/A 03/08/2015   RMR: Hiatal hernia Polypoid gastric mucosa with multiple gastric polyps. largest polyp removed via snare polypectomgy hemostasis clip placed at base. Status post gastric biopsy. Status post video capsule placement.    GIVENS CAPSULE STUDY N/A 03/08/2015   Procedure: GIVENS CAPSULE STUDY;  Surgeon: Corbin Ade, MD;  Location: [facility];  Service: Endoscopy;  Laterality: N/A;   GOLD SEED IMPLANT N/A 09/08/2022   Procedure: GOLD SEED IMPLANT;  Surgeon: Malen Gauze, MD;  Location: [facility];  Service: Urology;  Laterality: N/A;   LIMBAL STEM CELL TRANSPLANT     LUNG BIOPSY  6/06   MULTIPLE TOOTH EXTRACTIONS  04/2005   port a cath placement     PORT-A-CATH REMOVAL  09/08/2012   Procedure: REMOVAL PORT-A-CATH;  Surgeon: Loreli Slot, MD;  Location: [facility];  Service: Thoracic;  Laterality: N/A;   SPACE OAR INSTILLATION N/A 09/08/2022   Procedure: SPACE OAR INSTILLATION;  Surgeon: Malen Gauze, MD;  Location: [facility];  Service: Urology;  Laterality: N/A;    Social History   Socioeconomic History   Marital status: Married    Spouse name: Not on file  Number of children: 2   Years of education: Not on file   Highest education level: Not on file  Occupational History   Occupation: disability due to back    Employer: UNEMPLOYED  Tobacco Use   Smoking status: Former    Packs/day: 0.50    Years: 15.00    Additional pack years: 0.00    Total pack years: 7.50    Types: Cigarettes    Quit date: 11/11/2002    Years since quitting: 20.2   Smokeless tobacco: Never  Vaping Use   Vaping Use: Never used  Substance and Sexual Activity   Alcohol use: No    Alcohol/week: 0.0 standard drinks of alcohol   Drug use: No   Sexual activity: Yes    Birth control/protection: None  Other Topics Concern   Not on file  Social History Narrative   Married   No regular  exercise   Social Determinants of Health   Financial Resource Strain: Low Risk  (06/28/2020)   Overall Financial Resource Strain (CARDIA)    Difficulty of Paying Living Expenses: Not hard at all  Food Insecurity: No Food Insecurity (06/28/2020)   Hunger Vital Sign    Worried About Running Out of Food in the Last Year: Never true    Ran Out of Food in the Last Year: Never true  Transportation Needs: No Transportation Needs (06/28/2020)   PRAPARE - Administrator, Civil Service (Medical): No    Lack of Transportation (Non-Medical): No  Physical Activity: Inactive (06/28/2020)   Exercise Vital Sign    Days of Exercise per Week: 0 days    Minutes of Exercise per Session: 0 min  Stress: No Stress Concern Present (06/28/2020)   [employer] of Occupational Health - Occupational Stress Questionnaire    Feeling of Stress : Not at all  Social Connections: Moderately Integrated (06/28/2020)   Social Connection and Isolation Panel [NHANES]    Frequency of Communication with Friends and Family: More than three times a week    Frequency of Social Gatherings with Friends and Family: Twice a week    Attends Religious Services: More than 4 times per year    Active Member of [employer] or Organizations: No    Attends Banker Meetings: Never    Marital Status: Married    Family History  Problem Relation Age of Onset   Cancer Mother        lung   Hypertension Mother    Hyperlipidemia Mother    Cancer Father        prostate   Hypertension Father    Colon cancer Neg Hx    Diabetes Neg Hx     Outpatient Encounter Medications as of 02/10/2023  Medication Sig   apixaban (ELIQUIS) 5 MG TABS tablet Take 1 tablet (5 mg total) by mouth 2 (two) times daily.   B-D ULTRAFINE III SHORT PEN 31G X 8 MM MISC USE EVERY DAY   Blood Glucose Monitoring Suppl (ONE TOUCH ULTRA 2) w/Device KIT USE TO TEST BLOOD SUGAR ONCE D UTD   clotrimazole-betamethasone (LOTRISONE) cream Apply 1  application  topically 2 (two) times daily as needed (leg infection).   folic acid (FOLVITE) 1 MG tablet TAKE 1 TABLET(1 MG) BY MOUTH DAILY   furosemide (LASIX) 20 MG tablet Take 1 tablet (20 mg total) by mouth daily as needed for fluid or edema.   insulin degludec (TRESIBA FLEXTOUCH) 100 UNIT/ML FlexTouch Pen Inject 20 Units into the skin at  bedtime.   Lancets (ONETOUCH DELICA PLUS LANCET33G) MISC USE TO TEST TWICE DAILY   metoprolol tartrate (LOPRESSOR) 25 MG tablet Take 1 tablet (25 mg total) by mouth 2 (two) times daily.   ONETOUCH ULTRA test strip TEST TWICE DAILY   potassium chloride (KLOR-CON) 10 MEQ tablet Take 10 meq on days you need to take Lasix (Patient taking differently: 10 mEq. Take 10 meq on days you need to take Lasix)   rosuvastatin (CRESTOR) 40 MG tablet Take 40 mg by mouth daily.   vitamin B-12 (CYANOCOBALAMIN) 1000 MCG tablet Take 2 tablets (2,000 mcg total) by mouth daily.   Vitamin D, Ergocalciferol, (DRISDOL) 1.25 MG (50000 UNIT) CAPS capsule TAKE 1 CAPSULE BY MOUTH ONCE WEEKLY   [DISCONTINUED] metFORMIN (GLUCOPHAGE) 500 MG tablet TAKE 1 TABLET(500 MG) BY MOUTH TWICE DAILY WITH A MEAL   No facility-administered encounter medications on file as of 02/10/2023.    ALLERGIES: Allergies  Allergen Reactions   Penicillins Rash    Has patient had a PCN reaction causing immediate rash, facial/tongue/throat swelling, SOB or lightheadedness with hypotension: Unknown Has patient had a PCN reaction causing severe rash involving mucus membranes or skin necrosis: Unknown Has patient had a PCN reaction that required hospitalization: Unknown Has patient had a PCN reaction occurring within the last 10 years: No If all of the above answers are "NO", then may proceed with Cephalosporin use.     VACCINATION STATUS: Immunization History  Administered Date(s) Administered   Fluad Quad(high Dose 65+) 08/04/2019, 06/28/2020   Influenza Split 06/08/2012   Influenza,inj,Quad PF,6+ Mos  07/14/2013, 06/20/2015   Influenza-Unspecified 07/15/2011, 06/02/2012   Moderna Sars-Covid-2 Vaccination 11/03/2019, 12/05/2019    Diabetes He presents for his follow-up diabetic visit. He has type 2 diabetes mellitus. Onset time: He was diagnosed in March 2021 after at least 2 years of exposure to steroids related to his B-cell lymphoma which required autologous stem cell transplant bone marrow. His disease course has been stable. There are no hypoglycemic associated symptoms. Pertinent negatives for hypoglycemia include no confusion, headaches, pallor or seizures. Pertinent negatives for diabetes include no blurred vision, no chest pain, no fatigue, no polydipsia, no polyphagia, no polyuria and no weakness. There are no hypoglycemic complications. Symptoms are stable. Diabetic complications include nephropathy and PVD. Risk factors for coronary artery disease include diabetes mellitus, dyslipidemia, family history, male sex, obesity, tobacco exposure and sedentary lifestyle. Current diabetic treatment includes insulin injections and oral agent (monotherapy). He is compliant with treatment most of the time. His weight is decreasing steadily. He is following a generally healthy diet. When asked about meal planning, he reported none. He has not had a previous visit with a dietitian. He rarely participates in exercise. His home blood glucose trend is fluctuating minimally. His breakfast blood glucose range is generally 110-130 mg/dl. His bedtime blood glucose range is generally 130-140 mg/dl. (He presents today with his meter and logs showing at target glycemic profile overall.  His POCT A1c today is 5.5%, increasing from last visit but still well within the normal range.  A1c may be skewed due to anemia.  Analysis of his meter shows 7-day average of 140, 14-day average of 138, 30-day average of 139.  He had radiation for prostate cancer and notes his energy level has been wiped out.) An ACE  inhibitor/angiotensin II receptor blocker is not being taken. He sees a podiatrist.Eye exam is current.  Hyperlipidemia This is a chronic problem. The current episode started more than 1 year ago. The  problem is controlled. Recent lipid tests were reviewed and are normal. Exacerbating diseases include chronic renal disease, diabetes and obesity. Factors aggravating his hyperlipidemia include beta blockers and fatty foods. Pertinent negatives include no chest pain, myalgias or shortness of breath. Current antihyperlipidemic treatment includes statins. The current treatment provides moderate improvement of lipids. Compliance problems include adherence to exercise and adherence to diet.  Risk factors for coronary artery disease include dyslipidemia, diabetes mellitus, family history, obesity, male sex, hypertension and a sedentary lifestyle.  Hypertension This is a chronic problem. The current episode started more than 1 year ago. The problem has been resolved since onset. The problem is controlled. Pertinent negatives include no blurred vision, chest pain, headaches, neck pain, palpitations or shortness of breath. There are no associated agents to hypertension. Risk factors for coronary artery disease include dyslipidemia, diabetes mellitus, male gender, obesity, sedentary lifestyle, smoking/tobacco exposure and family history. Past treatments include beta blockers and diuretics. The current treatment provides mild improvement. Compliance problems include diet and exercise.  Hypertensive end-organ damage includes kidney disease and PVD. Identifiable causes of hypertension include chronic renal disease.    Review of systems  Constitutional: + decreasing body weight,  current Body mass index is 31.49 kg/m. , + fatigue, no subjective hyperthermia, no subjective hypothermia Eyes: no blurry vision, no xerophthalmia ENT: no sore throat, no nodules palpated in throat, no dysphagia/odynophagia, no  hoarseness Cardiovascular: no chest pain, no shortness of breath, no palpitations, + leg swelling with leaking- wears compression stockings- seeing specialist Respiratory: no cough, no shortness of breath Gastrointestinal: no nausea/vomiting/diarrhea Musculoskeletal: no muscle/joint aches, walks with cane Skin: no rashes, no hyperemia Neurological: no tremors, no numbness, no tingling, no dizziness Psychiatric: no depression, no anxiety  Objective:    BP 113/74 (BP Location: Right Arm, Patient Position: Sitting, Cuff Size: Large)   Pulse 76   Ht 5\' 11"  (1.803 m)   Wt 225 lb 12.8 oz (102.4 kg)   BMI 31.49 kg/m   Wt Readings from Last 3 Encounters:  02/10/23 225 lb 12.8 oz (102.4 kg)  02/03/23 230 lb (104.3 kg)  01/07/23 230 lb (104.3 kg)    BP Readings from Last 3 Encounters:  02/10/23 113/74  02/03/23 112/68  01/07/23 (!) 146/87    Physical Exam- Limited  Constitutional:  Body mass index is 31.49 kg/m. , not in acute distress, normal state of mind Eyes:  EOMI, no exophthalmos Musculoskeletal: no gross deformities, strength intact in all four extremities, no gross restriction of joint movements, walks with cane Skin:  no rashes, no hyperemia Neurological: no tremor with outstretched hands   Diabetic Foot Exam - Simple   No data filed     CMP ( most recent) CMP     Component Value Date/Time   NA 141 08/06/2022 1433   NA 142 07/30/2021 0923   K 3.4 (L) 08/06/2022 1433   CL 107 08/06/2022 1433   CO2 27 08/06/2022 1433   GLUCOSE 90 08/06/2022 1433   BUN 10 08/06/2022 1433   BUN 18 07/30/2021 0923   CREATININE 1.44 (H) 08/06/2022 1433   CALCIUM 8.9 08/06/2022 1433   PROT 6.3 07/30/2021 0923   ALBUMIN 3.9 07/30/2021 0923   AST 23 07/30/2021 0923   ALT 24 07/30/2021 0923   ALKPHOS 71 07/30/2021 0923   BILITOT 0.4 07/30/2021 0923   GFRNONAA 52 (L) 08/06/2022 1433   GFRAA 73 07/02/2020 0909    Diabetic Labs (most recent): Lab Results  Component Value Date  HGBA1C 5.5 02/10/2023   HGBA1C 4.9 08/11/2022   HGBA1C 5.3 02/09/2022    Lab Results  Component Value Date   TSH 2.650 07/30/2021   TSH 2.760 07/02/2020   TSH 1.122 09/08/2017   FREET4 1.12 07/30/2021   FREET4 1.06 07/02/2020      Assessment & Plan:   1) Uncontrolled type 2 diabetes mellitus with hyperglycemia (HCC)  - Frank Leff. has currently uncontrolled symptomatic type 2 DM since  72 years of age.  He presents today with his meter and logs showing at target glycemic profile overall.  His POCT A1c today is 5.5%, increasing from last visit but still well within the normal range.  A1c may be skewed due to anemia.  Analysis of his meter shows 7-day average of 140, 14-day average of 138, 30-day average of 139.  He had radiation for prostate cancer and notes his energy level has been wiped out.  - Recent labs reviewed.  - I had a long discussion with him about the progressive nature of diabetes and the pathology behind its complications. -his diabetes is complicated by obesity/sedentary life, peripheral arterial disease with venous stasis and he remains at a high risk for more acute and chronic complications which include CAD, CVA, CKD, retinopathy, and neuropathy. These are all discussed in detail with him.  - Nutritional counseling repeated at each appointment due to patients tendency to fall back in to old habits.  - The patient admits there is a room for improvement in their diet and drink choices. -  Suggestion is made for the patient to avoid simple carbohydrates from their diet including Cakes, Sweet Desserts / Pastries, Ice Cream, Soda (diet and regular), Sweet Tea, Candies, Chips, Cookies, Sweet Pastries, Store Bought Juices, Alcohol in Excess of 1-2 drinks a day, Artificial Sweeteners, Coffee Creamer, and "Sugar-free" Products. This will help patient to have stable blood glucose profile and potentially avoid unintended weight gain.   - I encouraged the patient to switch  to unprocessed or minimally processed complex starch and increased protein intake (animal or plant source), fruits, and vegetables.   - Patient is advised to stick to a routine mealtimes to eat 3 meals a day and avoid unnecessary snacks (to snack only to correct hypoglycemia).  - I have approached him with the following individualized plan to manage  his diabetes and patient agrees:   -Given his presentation with controlled glycemic profile, he will not need bolus insulin at this time.    -He is advised to continue his Tresiba 20 units SQ nightly but stop his Metformin altogether at this time (will take some pressure off kidneys and de-escalate his treatment some).   -He is encouraged to continue monitoring blood glucose levels twice daily, before breakfast and before bed and report blood glucose levels less than 70 or greater than 200 for 3 tests in a row. He is not interested in CGM at this time.  - he is not a candidate for TZDs, SGLT2 inhibitors due to compromised peripheral circulation.    - he will be considered for incretin therapy as appropriate next visit.  - Specific targets for  A1c;  LDL, HDL,  and Triglycerides were discussed with the patient.  2) Blood Pressure /Hypertension:  His blood pressure is controlled to target.  He is advised to continue Lasix 20 mg po daily as needed for fluid, and Metoprolol 25 mg po twice daily.  He is advised to adopt a low sodium diet.  3) Lipids/Hyperlipidemia:  His most recent lipid panel from 07/30/21 shows controlled LDL of 65.  He is advised to continue Crestor 40 mg po daily at bedtime.  Side effects and precautions discussed with him.    4)  Weight/Diet:  His Body mass index is 31.49 kg/m.  -   clearly complicating his diabetes care.   he is  a candidate for weight loss. I discussed with him the fact that loss of 5 - 10% of his  current body weight will have the most impact on his diabetes management.  Exercise, and detailed carbohydrates  information provided  -  detailed on discharge instructions.  5) Chronic Care/Health Maintenance: -he is on Statin medications and is encouraged to initiate and continue to follow up with Ophthalmology, Dentist,  Podiatrist at least yearly or according to recommendations, and advised to stay away from smoking. I have recommended yearly flu vaccine and pneumonia vaccine at least every 5 years; moderate intensity exercise for up to 150 minutes weekly; and  sleep for at least 7 hours a day.  - he is advised to maintain close follow up with [name], MD for primary care needs, as well as his other providers for optimal and coordinated care.     I spent  22  minutes in the care of the patient today including review of labs from CMP, Lipids, Thyroid Function, Hematology (current and previous including abstractions from other facilities); face-to-face time discussing  his blood glucose readings/logs, discussing hypoglycemia and hyperglycemia episodes and symptoms, medications doses, his options of short and long term treatment based on the latest standards of care / guidelines;  discussion about incorporating lifestyle medicine;  and documenting the encounter. Risk reduction counseling performed per USPSTF guidelines to reduce obesity and cardiovascular risk factors.     Please refer to Patient Instructions for Blood Glucose Monitoring and Insulin/Medications Dosing Guide"  in media tab for additional information. Please  also refer to " Patient Self Inventory" in the Media  tab for reviewed elements of pertinent patient history.  Frank Leff. participated in the discussions, expressed understanding, and voiced agreement with the above plans.  All questions were answered to his satisfaction. he is encouraged to contact clinic should he have any questions or concerns prior to his return visit.   Follow up plan: - Return in about 4 months (around 06/12/2023) for Diabetes F/U with A1c in office,  No previsit labs, Bring meter and logs.    Ronny Bacon, [facility] [address] [place], [place] [address] Phone: [phone] Fax: [phone]  02/10/2023, 9:55 AM

## 2023-02-17 DIAGNOSIS — I890 Lymphedema, not elsewhere classified: Secondary | ICD-10-CM | POA: Diagnosis not present

## 2023-02-26 ENCOUNTER — Other Ambulatory Visit: Payer: Self-pay | Admitting: Nurse Practitioner

## 2023-03-02 ENCOUNTER — Other Ambulatory Visit: Payer: Self-pay

## 2023-03-02 MED ORDER — BD PEN NEEDLE SHORT U/F 31G X 8 MM MISC: 3 refills | Status: DC

## 2023-03-17 DIAGNOSIS — I10 Essential (primary) hypertension: Secondary | ICD-10-CM | POA: Diagnosis not present

## 2023-03-17 DIAGNOSIS — Z0001 Encounter for general adult medical examination with abnormal findings: Secondary | ICD-10-CM | POA: Diagnosis not present

## 2023-03-17 DIAGNOSIS — G9332 Myalgic encephalomyelitis/chronic fatigue syndrome: Secondary | ICD-10-CM | POA: Diagnosis not present

## 2023-03-17 DIAGNOSIS — I4892 Unspecified atrial flutter: Secondary | ICD-10-CM | POA: Diagnosis not present

## 2023-03-17 DIAGNOSIS — C8580 Other specified types of non-Hodgkin lymphoma, unspecified site: Secondary | ICD-10-CM | POA: Diagnosis not present

## 2023-03-17 DIAGNOSIS — E1140 Type 2 diabetes mellitus with diabetic neuropathy, unspecified: Secondary | ICD-10-CM | POA: Diagnosis not present

## 2023-03-17 DIAGNOSIS — E1129 Type 2 diabetes mellitus with other diabetic kidney complication: Secondary | ICD-10-CM | POA: Diagnosis not present

## 2023-03-17 DIAGNOSIS — D5919 Other autoimmune hemolytic anemia: Secondary | ICD-10-CM | POA: Diagnosis not present

## 2023-03-17 DIAGNOSIS — I4891 Unspecified atrial fibrillation: Secondary | ICD-10-CM | POA: Diagnosis not present

## 2023-04-05 DIAGNOSIS — Z0001 Encounter for general adult medical examination with abnormal findings: Secondary | ICD-10-CM | POA: Diagnosis not present

## 2023-04-05 DIAGNOSIS — G9332 Myalgic encephalomyelitis/chronic fatigue syndrome: Secondary | ICD-10-CM | POA: Diagnosis not present

## 2023-04-05 DIAGNOSIS — E559 Vitamin D deficiency, unspecified: Secondary | ICD-10-CM | POA: Diagnosis not present

## 2023-04-05 DIAGNOSIS — E1140 Type 2 diabetes mellitus with diabetic neuropathy, unspecified: Secondary | ICD-10-CM | POA: Diagnosis not present

## 2023-04-21 ENCOUNTER — Ambulatory Visit: Payer: Medicare Other | Admitting: Vascular Surgery

## 2023-04-21 ENCOUNTER — Encounter: Payer: Self-pay | Admitting: Vascular Surgery

## 2023-04-21 ENCOUNTER — Ambulatory Visit (HOSPITAL_COMMUNITY)
Admission: RE | Admit: 2023-04-21 | Discharge: 2023-04-21 | Disposition: A | Discharge status: Home / Self Care | Payer: Medicare Other | Source: Ambulatory Visit | Attending: Vascular Surgery | Admitting: Vascular Surgery

## 2023-04-21 VITALS — BP 114/66 | HR 52 | Temp 97.8°F | Resp 18 | Ht 71.0 in | Wt 223.0 lb

## 2023-04-21 DIAGNOSIS — I872 Venous insufficiency (chronic) (peripheral): Secondary | ICD-10-CM | POA: Insufficient documentation

## 2023-04-21 DIAGNOSIS — I83813 Varicose veins of bilateral lower extremities with pain: Secondary | ICD-10-CM

## 2023-04-21 NOTE — Progress Notes (Signed)
REASON FOR VISIT:   Follow-up of combined chronic venous insufficiency and lymphedema.  MEDICAL ISSUES:   COMBINED CHRONIC VENOUS INSUFFICIENCY AND LYMPHEDEMA: I think the patient's swelling in both legs has improved some.  He has been elevating his legs, using his compression stockings, and using his lymphedema pump for 2 hours a day.  Currently he has no open ulcers.  He has CEAP C5 venous disease (healed ulcers).  His reflux study on the right shows predominantly deep venous reflux.  He has some reflux in the superficial system as documented below but this does not appear to be significant as the veins are not significantly dilated.  On the left side he does have significant dilation of the small saphenous vein which has reflux.  I think he would be a good candidate for laser ablation of the left small saphenous vein.  I think this would help lower venous pressure and lower his risk of recurrent venous ulcers and potentially help some with his swelling.  Currently has multiple issues and trying to get his wife to appointments and so he will call when he is able to schedule this.  Just so he is not lost to follow-up I have scheduled follow-up visit in 6 months in case we do not hear from them.   HPI:   [name]. is a pleasant 72 y.o. male who I last saw on 01/07/2023 with combined chronic venous insufficiency and lymphedema.  He had profound swelling of both lower extremities with hyperkeratosis and lymphorrhea bilaterally.  He had significant hyperpigmentation.  He had deep and superficial venous reflux on the left.  I felt that he might be a candidate for laser ablation of the left small saphenous vein however at that time his skin was compromised so I did not think we could consider this until the swelling and induration were under better control.  On the left side the small saphenous vein had reflux down to the mid calf and was dilated up to 6 mm.  Since I saw the patient last, he  obtained a lymphedema pump 3 to 4 months ago.  He has been using this for 1 hour twice a day.  He tells me however that his legs are dependent when he is using it.  He has been elevating his legs and using compression therapy.  He thinks the swelling has improved since he has been using the lymphedema pump.  He is on Eliquis.  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   autologous stem cell transplant 12/2005   Obesity, morbid (more than 100 lbs over ideal weight or BMI > 40) (HCC)    Venous insufficiency 04/29/2011    Family History  Problem Relation Age of Onset   Cancer Mother        lung   Hypertension Mother    Hyperlipidemia Mother    Cancer Father        prostate   Hypertension Father    Colon cancer Neg Hx    Diabetes Neg Hx     SOCIAL HISTORY: Social History   Tobacco Use  Smoking status: Former    Current packs/day: 0.00    Average packs/day: 0.5 packs/day for 15.0 years (7.5 ttl pk-yrs)    Types: Cigarettes    Start date: 11/11/1987    Quit date: 11/11/2002    Years since quitting: 20.4   Smokeless tobacco: Never  Substance Use Topics   Alcohol use: No    Alcohol/week: 0.0 standard drinks of alcohol    Allergies  Allergen Reactions   Penicillins Rash    Has patient had a PCN reaction causing immediate rash, facial/tongue/throat swelling, SOB or lightheadedness with hypotension: Unknown Has patient had a PCN reaction causing severe rash involving mucus membranes or skin necrosis: Unknown Has patient had a PCN reaction that required hospitalization: Unknown Has patient had a PCN reaction occurring within the last 10 years: No If all of the above answers are  "NO", then may proceed with Cephalosporin use.     Current Outpatient Medications  Medication Sig Dispense Refill   apixaban (ELIQUIS) 5 MG TABS tablet Take 1 tablet (5 mg total) by mouth 2 (two) times daily. 60 tablet 11   Blood Glucose Monitoring Suppl (ONE TOUCH ULTRA 2) w/Device KIT USE TO TEST BLOOD SUGAR ONCE D UTD     folic acid (FOLVITE) 1 MG tablet TAKE 1 TABLET(1 MG) BY MOUTH DAILY 30 tablet 4   furosemide (LASIX) 20 MG tablet Take 1 tablet (20 mg total) by mouth daily as needed for fluid or edema. 90 tablet 2   insulin degludec (TRESIBA FLEXTOUCH) 100 UNIT/ML FlexTouch Pen Inject 20 Units into the skin at bedtime. 18 mL 3   Insulin Pen Needle (B-D ULTRAFINE III SHORT PEN) 31G X 8 MM MISC USE TO INJECT MEDICATION EVERY DAY E11.65 100 each 3   Lancets (ONETOUCH DELICA PLUS LANCET33G) MISC USE TO TEST TWICE DAILY 300 each 3   ONETOUCH ULTRA test strip TEST TWICE DAILY 100 strip 3   potassium chloride (KLOR-CON) 10 MEQ tablet Take 10 meq on days you need to take Lasix (Patient taking differently: 10 mEq. Take 10 meq on days you need to take Lasix) 90 tablet 1   rosuvastatin (CRESTOR) 40 MG tablet Take 40 mg by mouth daily.     vitamin B-12 (CYANOCOBALAMIN) 1000 MCG tablet Take 2 tablets (2,000 mcg total) by mouth daily. 90 tablet 3   Vitamin D, Ergocalciferol, (DRISDOL) 1.25 MG (50000 UNIT) CAPS capsule TAKE 1 CAPSULE BY MOUTH ONCE WEEKLY 16 capsule 3   clotrimazole-betamethasone (LOTRISONE) cream Apply 1 application  topically 2 (two) times daily as needed (leg infection). (Patient not taking: Reported on 04/21/2023)     metoprolol tartrate (LOPRESSOR) 25 MG tablet Take 1 tablet (25 mg total) by mouth 2 (two) times daily. 180 tablet 3   No current facility-administered medications for this visit.    REVIEW OF SYSTEMS:  [X]  denotes positive finding, [ ]  denotes negative finding Cardiac  Comments:  Chest pain or chest pressure:    Shortness of breath upon exertion:    Short of  breath when lying flat:    Irregular heart rhythm:        Vascular    Pain in calf, thigh, or hip brought on by ambulation:    Pain in feet at night that wakes you up from your sleep:     Blood clot in your veins:    Leg swelling:  x       Pulmonary    Oxygen at home:    Productive cough:  Wheezing:         Neurologic    Sudden weakness in arms or legs:     Sudden numbness in arms or legs:     Sudden onset of difficulty speaking or slurred speech:    Temporary loss of vision in one eye:     Problems with dizziness:         Gastrointestinal    Blood in stool:     Vomited blood:         Genitourinary    Burning when urinating:     Blood in urine:        Psychiatric    Major depression:         Hematologic    Bleeding problems:    Problems with blood clotting too easily:        Skin    Rashes or ulcers: x       Constitutional    Fever or chills:     PHYSICAL EXAM:   Vitals:   04/21/23 1444  BP: 114/66  Pulse: (!) 52  Resp: 18  Temp: 97.8 F (36.6 C)  TempSrc: Temporal  SpO2: 100%  Weight: 223 lb (101.2 kg)  Height: 5\' 11"  (1.803 m)    GENERAL: The patient is a well-nourished male, in no acute distress. The vital signs are documented above. CARDIAC: There is a regular rate and rhythm.  VASCULAR: He has palpable dorsalis pedis pulses. I did look at his left small saphenous vein again with the SonoSite.  He has significant reflux and this vein which is significantly dilated up to 6 mm.  I do not see a vein of Giacomini. He continues to have significant hyperkeratosis and hyperpigmentation bilaterally.           PULMONARY: There is good air exchange bilaterally without wheezing or rales. ABDOMEN: Soft and non-tender with normal pitched bowel sounds.  MUSCULOSKELETAL: There are no major deformities or cyanosis. NEUROLOGIC: No focal weakness or paresthesias are detected. SKIN: There are no ulcers or rashes noted. PSYCHIATRIC: The patient has a  normal affect.  DATA:    VENOUS DUPLEX: I have independently interpreted his venous duplex scan today.  This was of the right lower extremity only.  There was no evidence of DVT.  There was deep venous reflux in the common femoral vein, femoral vein, and popliteal vein.  There was superficial venous reflux in the right great saphenous vein in the proximal thigh however the vein was not especially dilated.  There was also reflux in the right small saphenous vein but again the vein was not especially dilated.  The results of the study are summarized in the diagram below.       Waverly Ferrari [facility] [phone]

## 2023-04-23 ENCOUNTER — Other Ambulatory Visit: Payer: Self-pay | Admitting: Physician Assistant

## 2023-04-23 DIAGNOSIS — D649 Anemia, unspecified: Secondary | ICD-10-CM | POA: Diagnosis not present

## 2023-04-27 ENCOUNTER — Other Ambulatory Visit: Payer: Medicare Other

## 2023-04-27 DIAGNOSIS — C61 Malignant neoplasm of prostate: Secondary | ICD-10-CM

## 2023-04-29 LAB — PSA: Prostate Specific Ag, Serum: 0.9 ng/mL (ref 0.0–4.0)

## 2023-05-03 ENCOUNTER — Ambulatory Visit: Payer: Medicare Other | Admitting: Urology

## 2023-05-03 VITALS — BP 96/59 | HR 58

## 2023-05-03 DIAGNOSIS — N138 Other obstructive and reflux uropathy: Secondary | ICD-10-CM | POA: Diagnosis not present

## 2023-05-03 DIAGNOSIS — N401 Enlarged prostate with lower urinary tract symptoms: Secondary | ICD-10-CM | POA: Diagnosis not present

## 2023-05-03 DIAGNOSIS — R351 Nocturia: Secondary | ICD-10-CM

## 2023-05-03 DIAGNOSIS — C61 Malignant neoplasm of prostate: Secondary | ICD-10-CM

## 2023-05-03 LAB — URINALYSIS, ROUTINE W REFLEX MICROSCOPIC
Bilirubin, UA: NEGATIVE
Glucose, UA: NEGATIVE
Ketones, UA: NEGATIVE
Leukocytes,UA: NEGATIVE
Nitrite, UA: NEGATIVE
Specific Gravity, UA: 1.03 (ref 1.005–1.030)
Urobilinogen, Ur: 1 mg/dL (ref 0.2–1.0)
pH, UA: 6 (ref 5.0–7.5)

## 2023-05-03 LAB — MICROSCOPIC EXAMINATION: Bacteria, UA: NONE SEEN

## 2023-05-03 NOTE — Patient Instructions (Signed)

## 2023-05-03 NOTE — Progress Notes (Unsigned)
05/03/2023 10:59 AM   [name] September 06, 1950 [id number]  Referring provider: [name], MD [address] [place],  [place] [address]  No chief complaint on file.   HPI: PSA decreased to 0.9 from 1.6. IPSS 0 QOl 0 on no therapy.   PMH: 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   autologous stem cell transplant 12/2005   Obesity, morbid (more than 100 lbs over ideal weight or BMI > 40) (HCC)    Venous insufficiency 04/29/2011    Surgical History: Past Surgical History:  Procedure Laterality Date   BACK SURGERY  2006   T6 vertebrae removed/titanium placed   CATARACT EXTRACTION W/PHACO Right 03/31/2019   Procedure: CATARACT EXTRACTION PHACO AND INTRAOCULAR LENS PLACEMENT (IOC);  Surgeon: Fabio Pierce, MD;  Location: [facility];  Service: Ophthalmology;  Laterality: Right;  CDE: 3.21   CATARACT EXTRACTION W/PHACO Left 04/14/2019   Procedure: CATARACT EXTRACTION PHACO AND INTRAOCULAR LENS PLACEMENT (IOC);  Surgeon: Fabio Pierce, MD;  Location: [facility];  Service: Ophthalmology;  Laterality: Left;  left - pt knows to arrive at 7:45, CDE: 3.55   COLONOSCOPY  11/24/2004   Polyps in the left colon ablated/removed as described above.  Two submucosal lesions consistent with lipomas as described above not  manipulated./ Normal rectum   COLONOSCOPY  06/20/2012   MULTIPLE RECTAL AND COLONIC POLYPS   COLONOSCOPY N/A 03/08/2015   RMR: Capacious, redundant colon. Multiple colonic and rectosigmoid polyps removed. ablated as described above. colonic lipoma abnormal appearing terminal ileum likely a variant of normal). Howeverwith history  biopsies obtained.    ESOPHAGOGASTRODUODENOSCOPY  N/A 03/08/2015   RMR: Hiatal hernia Polypoid gastric mucosa with multiple gastric polyps. largest polyp removed via snare polypectomgy hemostasis clip placed at base. Status post gastric biopsy. Status post video capsule placement.    GIVENS CAPSULE STUDY N/A 03/08/2015   Procedure: GIVENS CAPSULE STUDY;  Surgeon: Corbin Ade, MD;  Location: [facility];  Service: Endoscopy;  Laterality: N/A;   GOLD SEED IMPLANT N/A 09/08/2022   Procedure: GOLD SEED IMPLANT;  Surgeon: Malen Gauze, MD;  Location: [facility];  Service: Urology;  Laterality: N/A;   LIMBAL STEM CELL TRANSPLANT     LUNG BIOPSY  6/06   MULTIPLE TOOTH EXTRACTIONS  04/2005   port a cath placement     PORT-A-CATH REMOVAL  09/08/2012   Procedure: REMOVAL PORT-A-CATH;  Surgeon: Loreli Slot, MD;  Location: [facility] OR;  Service: Thoracic;  Laterality: N/A;   SPACE OAR INSTILLATION N/A 09/08/2022   Procedure: SPACE OAR INSTILLATION;  Surgeon: Malen Gauze, MD;  Location: [facility];  Service: Urology;  Laterality: N/A;    Home Medications:  Allergies as of 05/03/2023       Reactions   Penicillins Rash   Has patient had a PCN reaction causing immediate rash, facial/tongue/throat swelling, SOB or lightheadedness with hypotension: Unknown Has patient had a PCN reaction causing severe rash involving mucus membranes or skin necrosis: Unknown Has patient had a PCN reaction that required hospitalization: Unknown Has patient had a PCN reaction occurring within the last 10 years: No If all of  the above answers are "NO", then may proceed with Cephalosporin use.        Medication List        Accurate as of May 03, 2023 10:59 AM. If you have any questions, ask your nurse or doctor.          apixaban 5 MG Tabs tablet Commonly known as: Eliquis Take 1 tablet (5 mg total) by mouth 2 (two) times daily.   B-D ULTRAFINE III SHORT PEN 31G X 8 MM Misc Generic drug: Insulin Pen Needle USE TO INJECT MEDICATION EVERY DAY  E11.65   clotrimazole-betamethasone cream Commonly known as: LOTRISONE Apply 1 application  topically 2 (two) times daily as needed (leg infection).   cyanocobalamin 1000 MCG tablet Commonly known as: VITAMIN B12 Take 2 tablets (2,000 mcg total) by mouth daily.   folic acid 1 MG tablet Commonly known as: FOLVITE TAKE 1 TABLET(1 MG) BY MOUTH DAILY   furosemide 20 MG tablet Commonly known as: LASIX Take 1 tablet (20 mg total) by mouth daily as needed for fluid or edema.   metoprolol tartrate 25 MG tablet Commonly known as: LOPRESSOR Take 1 tablet (25 mg total) by mouth 2 (two) times daily.   ONE TOUCH ULTRA 2 w/Device Kit USE TO TEST BLOOD SUGAR ONCE D UTD   OneTouch Delica Plus Lancet33G Misc USE TO TEST TWICE DAILY   OneTouch Ultra test strip Generic drug: glucose blood TEST TWICE DAILY   potassium chloride 10 MEQ tablet Commonly known as: KLOR-CON Take 10 meq on days you need to take Lasix What changed: how much to take   rosuvastatin 40 MG tablet Commonly known as: CRESTOR Take 40 mg by mouth daily.   Evaristo Bury FlexTouch 100 UNIT/ML FlexTouch Pen Generic drug: insulin degludec Inject 20 Units into the skin at bedtime.   Vitamin D (Ergocalciferol) 1.25 MG (50000 UNIT) Caps capsule Commonly known as: DRISDOL TAKE 1 CAPSULE BY MOUTH ONCE WEEKLY        Allergies:  Allergies  Allergen Reactions   Penicillins Rash    Has patient had a PCN reaction causing immediate rash, facial/tongue/throat swelling, SOB or lightheadedness with hypotension: Unknown Has patient had a PCN reaction causing severe rash involving mucus membranes or skin necrosis: Unknown Has patient had a PCN reaction that required hospitalization: Unknown Has patient had a PCN reaction occurring within the last 10 years: No If all of the above answers are "NO", then may proceed with Cephalosporin use.     Family History: Family History  Problem Relation Age of Onset   Cancer Mother         lung   Hypertension Mother    Hyperlipidemia Mother    Cancer Father        prostate   Hypertension Father    Colon cancer Neg Hx    Diabetes Neg Hx     Social History:  reports that he quit smoking about 20 years ago. His smoking use included cigarettes. He started smoking about 35 years ago. He has a 7.5 pack-year smoking history. He has never used smokeless tobacco. He reports that he does not drink alcohol and does not use drugs.  ROS: All other review of systems were reviewed and are negative except what is noted above in HPI  Physical Exam: BP (!) 96/59   Pulse (!) 58   Constitutional:  Alert and oriented, No acute distress. HEENT: [place] AT, moist mucus membranes.  Trachea midline, no masses. Cardiovascular: No clubbing, cyanosis, or edema.  Respiratory: Normal respiratory effort, no increased work of breathing. GI: Abdomen is soft, nontender, nondistended, no abdominal masses GU: No CVA tenderness.  Lymph: No cervical or inguinal lymphadenopathy. Skin: No rashes, bruises or suspicious lesions. Neurologic: Grossly intact, no focal deficits, moving all 4 extremities. Psychiatric: Normal mood and affect.  Laboratory Data: Lab Results  Component Value Date   WBC 4.0 08/06/2022   HGB 11.7 (L) 08/06/2022   HCT 37.0 (L) 08/06/2022   MCV 98.4 08/06/2022   PLT 151 08/06/2022    Lab Results  Component Value Date   CREATININE 1.44 (H) 08/06/2022    No results found for: "PSA"  No results found for: "TESTOSTERONE"  Lab Results  Component Value Date   HGBA1C 5.5 02/10/2023    Urinalysis    Component Value Date/Time   COLORURINE AMBER (A) 08/02/2019 2010   APPEARANCEUR Clear 07/20/2022 1417   LABSPEC 1.018 08/02/2019 2010   PHURINE 5.0 08/02/2019 2010   GLUCOSEU Negative 07/20/2022 1417   HGBUR MODERATE (A) 08/02/2019 2010   BILIRUBINUR Negative 07/20/2022 1417   KETONESUR 5 (A) 08/02/2019 2010   PROTEINUR 3+ (A) 07/20/2022 1417   PROTEINUR 30 (A) 08/02/2019 2010    NITRITE Negative 07/20/2022 1417   NITRITE NEGATIVE 08/02/2019 2010   LEUKOCYTESUR Negative 07/20/2022 1417   LEUKOCYTESUR NEGATIVE 08/02/2019 2010    Lab Results  Component Value Date   LABMICR See below: 07/20/2022   WBCUA 0-5 07/20/2022   LABEPIT 0-10 07/20/2022   BACTERIA None seen 07/20/2022    Pertinent Imaging: *** No results found for this or any previous visit.  Results for orders placed during the hospital encounter of 10/24/08  US VenoUS Imaging Bilateral  Narrative Clinical Data: Elevated D-dimer  BILATERAL LOWER EXTREMITY VENOUS DUPLEX ULTRASOUND  Technique: Gray-scale sonography with graded compression, as well as color Doppler and duplex ultrasound, were performed to evaluate the deep venous system of both lower extremities from the level of the common femoral vein through the popliteal and proximal calf veins. Spectral Doppler was utilized to evaluate flow at rest and with distal augmentation maneuvers.  Comparison: None  Findings:  Normal compressibility of  bilateral common femoral, superficial femoral, and popliteal veins is demonstrated, as well as the visualized proximal calf veins.  No filling defects to suggest DVT on grayscale or color Doppler imaging.  Doppler waveforms show normal direction of venous flow, normal respiratory phasicity and response to augmentation.  IMPRESSION: No evidence of bilateral lower extremity deep vein thrombosis.  Provider: Morley Kos  No results found for this or any previous visit.  No results found for this or any previous visit.  Results for orders placed during the hospital encounter of 05/18/17  US RENAL  Narrative CLINICAL DATA:  Acute kidney injury. Admitted for pneumonia. History of hypertension, lymphoma, diabetes.  EXAM: RENAL / URINARY TRACT ULTRASOUND COMPLETE  COMPARISON:  CT chest, abdomen and pelvis May 18, 2017  FINDINGS: Right Kidney:  Length: 12.8 cm. Echogenicity  within normal limits. No mass or hydronephrosis visualized.  Left Kidney:  Length: 11 cm. Echogenicity within normal limits. No mass or hydronephrosis visualized.  Bladder:  Decompressed.  Incidental LEFT pleural effusion as seen on yesterday's CT chest.  IMPRESSION: Normal renal ultrasound.   Electronically Signed By: Awilda Metro M.D. On: 05/19/2017 13:46  No valid procedures specified. No results found for this or any previous visit.  No results found for this or any previous visit.   Assessment & Plan:    1. Prostate  cancer (HCC) -followup 6 monthsw ith PSA - Urinalysis, Routine w reflex microscopic  2. Benign prostatic hyperplasia with urinary obstruction Patient defers therapy at this time  3. Nocturia ***   No follow-ups on file.  Wilkie Aye, MD  [facility] [place]

## 2023-05-04 ENCOUNTER — Encounter: Payer: Self-pay | Admitting: Urology

## 2023-05-05 ENCOUNTER — Ambulatory Visit: Payer: Medicare Other | Admitting: Cardiology

## 2023-05-05 NOTE — Progress Notes (Deleted)
Clinical Summary [name] is a 72 y.o.male  seen today for follow up of the following medical problems.      1. Aflutter   - no palpitations - no bleeding on eliquis.    2. Chest discomfort - occurs once a month - dull pain, left sided. 1/10 in severity. Usually comes on at rest - lasts a few minutes. Better with deep breathing.  - 2016 nuclear stress test no ischemia   -denies any symptoms.    3. Chronic diastolic HF Jan 2019 echo LVEF 60-65%, indet diastolic function - 07/2022 LVEF 55-60%, grade II dd - swelling at times, resolves with prn lasix.    - chronic LE edema more so related to venous relfux, working to wear compression stockings.  - has lasix as needed, takes daily. Last Cr had trended up to 1.4     - ongoing edema, up and down.  - has prn lasix, takes about 2-3 times a week. - SOB is up and down, overall stable over time.      4. LE edema - 2021 had signs of venous reflux by [place] - 2023 [place]: right venous reflux several veins - has compression stockings.  - followed by [facility],from there note combined lymphedema and venous insufficiency. Possible ablation candidate for reflux     5. Diffuse large B cell lymphoma - prior bone marrow transplant - history of hemolytic anemia   6. HTN - has not taken meds yet today -   7.Prostate cancer - followed by urology   SH: takes care of grandson during the day, 46 yo.  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   autologous stem cell transplant 12/2005   Obesity, morbid (more than 100 lbs over ideal weight or BMI > 40) (HCC)    Venous insufficiency 04/29/2011     Allergies  Allergen Reactions    Penicillins Rash    Has patient had a PCN reaction causing immediate rash, facial/tongue/throat swelling, SOB or lightheadedness with hypotension: Unknown Has patient had a PCN reaction causing severe rash involving mucus membranes or skin necrosis: Unknown Has patient had a PCN reaction that required hospitalization: Unknown Has patient had a PCN reaction occurring within the last 10 years: No If all of the above answers are "NO", then may proceed with Cephalosporin use.      Current Outpatient Medications  Medication Sig Dispense Refill   apixaban (ELIQUIS) 5 MG TABS tablet Take 1 tablet (5 mg total) by mouth 2 (two) times daily. 60 tablet 11   Blood Glucose Monitoring Suppl (ONE TOUCH ULTRA 2) w/Device KIT USE TO TEST BLOOD SUGAR ONCE D UTD     clotrimazole-betamethasone (LOTRISONE) cream Apply 1 application  topically 2 (two) times daily as needed (leg infection). (Patient not taking: Reported on 04/21/2023)     folic acid (FOLVITE) 1 MG tablet TAKE 1 TABLET(1 MG) BY MOUTH DAILY 30 tablet 4   furosemide (LASIX) 20 MG tablet Take 1 tablet (20 mg total) by mouth daily as needed for fluid or edema. 90 tablet 2   insulin degludec (TRESIBA FLEXTOUCH) 100 UNIT/ML  FlexTouch Pen Inject 20 Units into the skin at bedtime. 18 mL 3   Insulin Pen Needle (B-D ULTRAFINE III SHORT PEN) 31G X 8 MM MISC USE TO INJECT MEDICATION EVERY DAY E11.65 100 each 3   Lancets (ONETOUCH DELICA PLUS LANCET33G) MISC USE TO TEST TWICE DAILY 300 each 3   metoprolol tartrate (LOPRESSOR) 25 MG tablet Take 1 tablet (25 mg total) by mouth 2 (two) times daily. 180 tablet 3   ONETOUCH ULTRA test strip TEST TWICE DAILY 100 strip 3   potassium chloride (KLOR-CON) 10 MEQ tablet Take 10 meq on days you need to take Lasix (Patient taking differently: 10 mEq. Take 10 meq on days you need to take Lasix) 90 tablet 1   rosuvastatin (CRESTOR) 40 MG tablet Take 40 mg by mouth daily.     vitamin B-12 (CYANOCOBALAMIN) 1000 MCG tablet Take 2  tablets (2,000 mcg total) by mouth daily. 90 tablet 3   Vitamin D, Ergocalciferol, (DRISDOL) 1.25 MG (50000 UNIT) CAPS capsule TAKE 1 CAPSULE BY MOUTH ONCE WEEKLY 16 capsule 3   No current facility-administered medications for this visit.     Past Surgical History:  Procedure Laterality Date   BACK SURGERY  2006   T6 vertebrae removed/titanium placed   CATARACT EXTRACTION W/PHACO Right 03/31/2019   Procedure: CATARACT EXTRACTION PHACO AND INTRAOCULAR LENS PLACEMENT (IOC);  Surgeon: Fabio Pierce, MD;  Location: [facility];  Service: Ophthalmology;  Laterality: Right;  CDE: 3.21   CATARACT EXTRACTION W/PHACO Left 04/14/2019   Procedure: CATARACT EXTRACTION PHACO AND INTRAOCULAR LENS PLACEMENT (IOC);  Surgeon: Fabio Pierce, MD;  Location: [facility];  Service: Ophthalmology;  Laterality: Left;  left - pt knows to arrive at 7:45, CDE: 3.55   COLONOSCOPY  11/24/2004   Polyps in the left colon ablated/removed as described above.  Two submucosal lesions consistent with lipomas as described above not  manipulated./ Normal rectum   COLONOSCOPY  06/20/2012   MULTIPLE RECTAL AND COLONIC POLYPS   COLONOSCOPY N/A 03/08/2015   RMR: Capacious, redundant colon. Multiple colonic and rectosigmoid polyps removed. ablated as described above. colonic lipoma abnormal appearing terminal ileum likely a variant of normal). Howeverwith history  biopsies obtained.    ESOPHAGOGASTRODUODENOSCOPY N/A 03/08/2015   RMR: Hiatal hernia Polypoid gastric mucosa with multiple gastric polyps. largest polyp removed via snare polypectomgy hemostasis clip placed at base. Status post gastric biopsy. Status post video capsule placement.    GIVENS CAPSULE STUDY N/A 03/08/2015   Procedure: GIVENS CAPSULE STUDY;  Surgeon: Corbin Ade, MD;  Location: [facility];  Service: Endoscopy;  Laterality: N/A;   GOLD SEED IMPLANT N/A 09/08/2022   Procedure: GOLD SEED IMPLANT;  Surgeon: Malen Gauze, MD;  Location: [facility];  Service: Urology;   Laterality: N/A;   LIMBAL STEM CELL TRANSPLANT     LUNG BIOPSY  6/06   MULTIPLE TOOTH EXTRACTIONS  04/2005   port a cath placement     PORT-A-CATH REMOVAL  09/08/2012   Procedure: REMOVAL PORT-A-CATH;  Surgeon: Loreli Slot, MD;  Location: [facility];  Service: Thoracic;  Laterality: N/A;   SPACE OAR INSTILLATION N/A 09/08/2022   Procedure: SPACE OAR INSTILLATION;  Surgeon: Malen Gauze, MD;  Location: [facility];  Service: Urology;  Laterality: N/A;     Allergies  Allergen Reactions   Penicillins Rash    Has patient had a PCN reaction causing immediate rash, facial/tongue/throat swelling, SOB or lightheadedness with hypotension: Unknown Has patient had a PCN reaction causing severe rash  involving mucus membranes or skin necrosis: Unknown Has patient had a PCN reaction that required hospitalization: Unknown Has patient had a PCN reaction occurring within the last 10 years: No If all of the above answers are "NO", then may proceed with Cephalosporin use.       Family History  Problem Relation Age of Onset   Cancer Mother        lung   Hypertension Mother    Hyperlipidemia Mother    Cancer Father        prostate   Hypertension Father    Colon cancer Neg Hx    Diabetes Neg Hx      Social History [name] reports that he quit smoking about 20 years ago. His smoking use included cigarettes. He started smoking about 35 years ago. He has a 7.5 pack-year smoking history. He has never used smokeless tobacco. [name] reports no history of alcohol use.   Review of Systems CONSTITUTIONAL: No weight loss, fever, chills, weakness or fatigue.  HEENT: Eyes: No visual loss, blurred vision, double vision or yellow sclerae.No hearing loss, sneezing, congestion, runny nose or sore throat.  SKIN: No rash or itching.  CARDIOVASCULAR:  RESPIRATORY: No shortness of breath, cough or sputum.  GASTROINTESTINAL: No anorexia, nausea, vomiting or diarrhea. No abdominal pain or blood.   GENITOURINARY: No burning on urination, no polyuria NEUROLOGICAL: No headache, dizziness, syncope, paralysis, ataxia, numbness or tingling in the extremities. No change in bowel or bladder control.  MUSCULOSKELETAL: No muscle, back pain, joint pain or stiffness.  LYMPHATICS: No enlarged nodes. No history of splenectomy.  PSYCHIATRIC: No history of depression or anxiety.  ENDOCRINOLOGIC: No reports of sweating, cold or heat intolerance. No polyuria or polydipsia.  Marland Kitchen   Physical Examination There were no vitals filed for this visit. There were no vitals filed for this visit.  Gen: resting comfortably, no acute distress HEENT: no scleral icterus, pupils equal round and reactive, no palptable cervical adenopathy,  CV Resp: Clear to auscultation bilaterally GI: abdomen is soft, non-tender, non-distended, normal bowel sounds, no hepatosplenomegaly MSK: extremities are warm, no edema.  Skin: warm, no rash Neuro:  no focal deficits Psych: appropriate affect   Diagnostic Studies Nuclear stress test 03/27/15:   There was no ST segment deviation noted during stress. Defect 1: There is a small defect of moderate severity present in the basal inferior location. Due to soft tissue attenuation artifact. This is a low risk study. The left ventricular ejection fraction is normal (55-65%).      Echo 09/07/17:   - Procedure narrative: Transthoracic echocardiography. Image    quality was adequate. Intravenous contrast (Definity) was    administered.  - Left ventricle: The cavity size was normal. Wall thickness was    increased in a pattern of mild LVH. Systolic function was normal.    The estimated ejection fraction was in the range of 60% to 65%.    Wall motion was normal; there were no regional wall motion    abnormalities. The study is not technically sufficient to allow    evaluation of LV diastolic function due to atrial flutter.  - Left atrium: The atrium was mildly dilated.  - Systemic  veins: IVC is dilated with normal respiratory variation.    Estimated CVP 8 mmHg.       07/2022 echo IMPRESSIONS     1. Left ventricular ejection fraction, by estimation, is 55 to 60%. The  left ventricle has normal function. The left ventricle has  no regional  wall motion abnormalities. There is mild concentric left ventricular  hypertrophy. Left ventricular diastolic  parameters are consistent with Grade II diastolic dysfunction  (pseudonormalization).   2. Right ventricular systolic function is normal. The right ventricular  size is normal. There is normal pulmonary artery systolic pressure. The  estimated right ventricular systolic pressure is 27.4 mmHg.   3. Left atrial size was moderately dilated.   4. The mitral valve is grossly normal. Mild mitral valve regurgitation.   5. The aortic valve is tricuspid. Aortic valve regurgitation is not  visualized.   6. The inferior vena cava is dilated in size with >50% respiratory  variability, suggesting right atrial pressure of 8 mmHg.     Assessment and Plan   1. Aflutter/acquired thrombophilia - no recent symptoms, continue current meds including eliquis for stroke prevention     2. Chronic diastolic HF - a lot of his chronic LE edema is more related to venous reflux - euvoelmic from HF standpoint, continue prn lasix   3. HTN - essentially right at goal, monitor at this time. If trends uprward would need to adjust meds     Antoine Poche, M.D., F.A.C.C.

## 2023-05-11 NOTE — Progress Notes (Signed)
Letter sent.

## 2023-06-16 ENCOUNTER — Ambulatory Visit: Payer: Medicare Other | Admitting: Nurse Practitioner

## 2023-06-16 ENCOUNTER — Encounter: Payer: Self-pay | Admitting: Nurse Practitioner

## 2023-06-16 VITALS — BP 102/70 | HR 80 | Ht 71.0 in | Wt 207.2 lb

## 2023-06-16 DIAGNOSIS — E1122 Type 2 diabetes mellitus with diabetic chronic kidney disease: Secondary | ICD-10-CM | POA: Diagnosis not present

## 2023-06-16 DIAGNOSIS — Z7984 Long term (current) use of oral hypoglycemic drugs: Secondary | ICD-10-CM

## 2023-06-16 DIAGNOSIS — E782 Mixed hyperlipidemia: Secondary | ICD-10-CM | POA: Diagnosis not present

## 2023-06-16 DIAGNOSIS — Z794 Long term (current) use of insulin: Secondary | ICD-10-CM | POA: Diagnosis not present

## 2023-06-16 DIAGNOSIS — E559 Vitamin D deficiency, unspecified: Secondary | ICD-10-CM

## 2023-06-16 DIAGNOSIS — I10 Essential (primary) hypertension: Secondary | ICD-10-CM

## 2023-06-16 DIAGNOSIS — N1831 Chronic kidney disease, stage 3a: Secondary | ICD-10-CM

## 2023-06-16 LAB — POCT GLYCOSYLATED HEMOGLOBIN (HGB A1C): Hemoglobin A1C: 4.6 % (ref 4.0–5.6)

## 2023-06-16 NOTE — Progress Notes (Signed)
06/16/2023, 11:02 AM  Endocrinology follow-up note     Subjective:    Patient ID: Frank Leff., male    DOB: [birth year].  Frank Leff. is being seen in follow-up after he was seen in consultation for management of currently uncontrolled symptomatic diabetes requested by  [name], MD.   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   autologous stem cell transplant 12/2005   Obesity, morbid (more than 100 lbs over ideal weight or BMI > 40) (HCC)    Venous insufficiency 04/29/2011    Past Surgical History:  Procedure Laterality Date   BACK SURGERY  2006   T6 vertebrae removed/titanium placed   CATARACT EXTRACTION W/PHACO Right 03/31/2019   Procedure: CATARACT EXTRACTION PHACO AND INTRAOCULAR LENS PLACEMENT (IOC);  Surgeon: Fabio Pierce, MD;  Location: [facility];  Service: Ophthalmology;  Laterality: Right;  CDE: 3.21   CATARACT EXTRACTION W/PHACO Left 04/14/2019   Procedure: CATARACT EXTRACTION PHACO AND INTRAOCULAR LENS PLACEMENT (IOC);  Surgeon: Fabio Pierce, MD;  Location: [facility];  Service: Ophthalmology;  Laterality: Left;  left - pt knows to arrive at 7:45, CDE: 3.55   COLONOSCOPY  11/24/2004   Polyps in the left colon ablated/removed as described above.  Two submucosal lesions consistent with lipomas as described above not  manipulated./ Normal rectum   COLONOSCOPY  06/20/2012   MULTIPLE RECTAL AND COLONIC POLYPS   COLONOSCOPY N/A 03/08/2015   RMR: Capacious, redundant colon. Multiple colonic and rectosigmoid polyps removed. ablated as described above. colonic lipoma abnormal appearing terminal  ileum likely a variant of normal). Howeverwith history  biopsies obtained.    ESOPHAGOGASTRODUODENOSCOPY N/A 03/08/2015   RMR: Hiatal hernia Polypoid gastric mucosa with multiple gastric polyps. largest polyp removed via snare polypectomgy hemostasis clip placed at base. Status post gastric biopsy. Status post video capsule placement.    GIVENS CAPSULE STUDY N/A 03/08/2015   Procedure: GIVENS CAPSULE STUDY;  Surgeon: Corbin Ade, MD;  Location: [facility];  Service: Endoscopy;  Laterality: N/A;   GOLD SEED IMPLANT N/A 09/08/2022   Procedure: GOLD SEED IMPLANT;  Surgeon: Malen Gauze, MD;  Location: [facility];  Service: Urology;  Laterality: N/A;   LIMBAL STEM CELL TRANSPLANT     LUNG BIOPSY  6/06   MULTIPLE TOOTH EXTRACTIONS  04/2005   port a cath placement     PORT-A-CATH REMOVAL  09/08/2012   Procedure: REMOVAL PORT-A-CATH;  Surgeon: Loreli Slot, MD;  Location: [facility] OR;  Service: Thoracic;  Laterality: N/A;   SPACE OAR INSTILLATION N/A 09/08/2022   Procedure: SPACE OAR INSTILLATION;  Surgeon: Malen Gauze, MD;  Location: [facility];  Service: Urology;  Laterality: N/A;    Social History   Socioeconomic History   Marital status: Married    Spouse name: Not on file  Number of children: 2   Years of education: Not on file   Highest education level: Not on file  Occupational History   Occupation: disability due to back    Employer: UNEMPLOYED  Tobacco Use   Smoking status: Former    Current packs/day: 0.00    Average packs/day: 0.5 packs/day for 15.0 years (7.5 ttl pk-yrs)    Types: Cigarettes    Start date: 11/11/1987    Quit date: 11/11/2002    Years since quitting: 20.6   Smokeless tobacco: Never  Vaping Use   Vaping status: Never Used  Substance and Sexual Activity   Alcohol use: No    Alcohol/week: 0.0 standard drinks of alcohol   Drug use: No   Sexual activity: Yes    Birth control/protection: None  Other Topics Concern   Not on file  Social History  Narrative   Married   No regular exercise   Social Determinants of Health   Financial Resource Strain: Low Risk  (06/28/2020)   Overall Financial Resource Strain (CARDIA)    Difficulty of Paying Living Expenses: Not hard at all  Food Insecurity: No Food Insecurity (06/28/2020)   Hunger Vital Sign    Worried About Running Out of Food in the Last Year: Never true    Ran Out of Food in the Last Year: Never true  Transportation Needs: No Transportation Needs (06/28/2020)   PRAPARE - Administrator, Civil Service (Medical): No    Lack of Transportation (Non-Medical): No  Physical Activity: Inactive (06/28/2020)   Exercise Vital Sign    Days of Exercise per Week: 0 days    Minutes of Exercise per Session: 0 min  Stress: No Stress Concern Present (06/28/2020)   [employer] of Occupational Health - Occupational Stress Questionnaire    Feeling of Stress : Not at all  Social Connections: Moderately Integrated (06/28/2020)   Social Connection and Isolation Panel [NHANES]    Frequency of Communication with Friends and Family: More than three times a week    Frequency of Social Gatherings with Friends and Family: Twice a week    Attends Religious Services: More than 4 times per year    Active Member of [employer] or Organizations: No    Attends Banker Meetings: Never    Marital Status: Married    Family History  Problem Relation Age of Onset   Cancer Mother        lung   Hypertension Mother    Hyperlipidemia Mother    Cancer Father        prostate   Hypertension Father    Colon cancer Neg Hx    Diabetes Neg Hx     Outpatient Encounter Medications as of 06/16/2023  Medication Sig   apixaban (ELIQUIS) 5 MG TABS tablet Take 1 tablet (5 mg total) by mouth 2 (two) times daily.   Blood Glucose Monitoring Suppl (ONE TOUCH ULTRA 2) w/Device KIT USE TO TEST BLOOD SUGAR ONCE D UTD   FEROSUL 325 (65 Fe) MG tablet Take 325 mg by mouth 2 (two) times daily.   folic acid  (FOLVITE) 1 MG tablet TAKE 1 TABLET(1 MG) BY MOUTH DAILY   furosemide (LASIX) 20 MG tablet Take 1 tablet (20 mg total) by mouth daily as needed for fluid or edema.   Lancets (ONETOUCH DELICA PLUS LANCET33G) MISC USE TO TEST TWICE DAILY   ONETOUCH ULTRA test strip TEST TWICE DAILY   potassium chloride (KLOR-CON) 10 MEQ tablet Take 10  meq on days you need to take Lasix (Patient taking differently: 10 mEq. Take 10 meq on days you need to take Lasix)   rosuvastatin (CRESTOR) 40 MG tablet Take 40 mg by mouth daily.   vitamin B-12 (CYANOCOBALAMIN) 1000 MCG tablet Take 2 tablets (2,000 mcg total) by mouth daily.   Vitamin D, Ergocalciferol, (DRISDOL) 1.25 MG (50000 UNIT) CAPS capsule TAKE 1 CAPSULE BY MOUTH ONCE WEEKLY   [DISCONTINUED] insulin degludec (TRESIBA FLEXTOUCH) 100 UNIT/ML FlexTouch Pen Inject 20 Units into the skin at bedtime.   [DISCONTINUED] Insulin Pen Needle (B-D ULTRAFINE III SHORT PEN) 31G X 8 MM MISC USE TO INJECT MEDICATION EVERY DAY E11.65   clotrimazole-betamethasone (LOTRISONE) cream Apply 1 application  topically 2 (two) times daily as needed (leg infection). (Patient not taking: Reported on 04/21/2023)   metoprolol tartrate (LOPRESSOR) 25 MG tablet Take 1 tablet (25 mg total) by mouth 2 (two) times daily.   No facility-administered encounter medications on file as of 06/16/2023.    ALLERGIES: Allergies  Allergen Reactions   Penicillins Rash    Has patient had a PCN reaction causing immediate rash, facial/tongue/throat swelling, SOB or lightheadedness with hypotension: Unknown Has patient had a PCN reaction causing severe rash involving mucus membranes or skin necrosis: Unknown Has patient had a PCN reaction that required hospitalization: Unknown Has patient had a PCN reaction occurring within the last 10 years: No If all of the above answers are "NO", then may proceed with Cephalosporin use.     VACCINATION STATUS: Immunization History  Administered Date(s) Administered    Fluad Quad(high Dose 65+) 08/04/2019, 06/28/2020   Influenza Split 06/08/2012   Influenza,inj,Quad PF,6+ Mos 07/14/2013, 06/20/2015   Influenza-Unspecified 07/15/2011, 06/02/2012   Moderna Sars-Covid-2 Vaccination 11/03/2019, 12/05/2019    Diabetes He presents for his follow-up diabetic visit. He has type 2 diabetes mellitus. Onset time: He was diagnosed in March 2021 after at least 2 years of exposure to steroids related to his B-cell lymphoma which required autologous stem cell transplant bone marrow. His disease course has been stable. There are no hypoglycemic associated symptoms. Pertinent negatives for hypoglycemia include no confusion, headaches, pallor or seizures. Associated symptoms include weakness and weight loss. Pertinent negatives for diabetes include no blurred vision, no chest pain, no fatigue, no polydipsia, no polyphagia and no polyuria. There are no hypoglycemic complications. Symptoms are stable. Diabetic complications include nephropathy and PVD. Risk factors for coronary artery disease include diabetes mellitus, dyslipidemia, family history, male sex, obesity, tobacco exposure and sedentary lifestyle. Current diabetic treatment includes insulin injections. He is compliant with treatment most of the time. His weight is decreasing steadily. He is following a generally healthy diet. When asked about meal planning, he reported none. He has not had a previous visit with a dietitian. He rarely participates in exercise. His home blood glucose trend is decreasing steadily. His breakfast blood glucose range is generally 90-110 mg/dl. His bedtime blood glucose range is generally 90-110 mg/dl. His overall blood glucose range is 90-110 mg/dl. (He presents today with his meter and logs showing tight glycemic profile overall.  His POCT A1c today is 4.6%, improving from last visit of 5.5%.  A1c may be skewed due to anemia.  Analysis of his meter shows 7-day average of 108, 14-day average of 105,  30-day average of 113.  He had radiation for prostate cancer and notes his energy level has been wiped out.  He continues to lose weight and recently lost his father.) An ACE inhibitor/angiotensin II receptor blocker  is not being taken. He sees a podiatrist.Eye exam is current.  Hyperlipidemia This is a chronic problem. The current episode started more than 1 year ago. The problem is controlled. Recent lipid tests were reviewed and are normal. Exacerbating diseases include chronic renal disease, diabetes and obesity. Factors aggravating his hyperlipidemia include beta blockers and fatty foods. Pertinent negatives include no chest pain, myalgias or shortness of breath. Current antihyperlipidemic treatment includes statins. The current treatment provides moderate improvement of lipids. Compliance problems include adherence to exercise and adherence to diet.  Risk factors for coronary artery disease include dyslipidemia, diabetes mellitus, family history, obesity, male sex, hypertension and a sedentary lifestyle.  Hypertension This is a chronic problem. The current episode started more than 1 year ago. The problem has been resolved since onset. The problem is controlled. Pertinent negatives include no blurred vision, chest pain, headaches, neck pain, palpitations or shortness of breath. There are no associated agents to hypertension. Risk factors for coronary artery disease include dyslipidemia, diabetes mellitus, male gender, obesity, sedentary lifestyle, smoking/tobacco exposure and family history. Past treatments include beta blockers and diuretics. The current treatment provides mild improvement. Compliance problems include diet and exercise.  Hypertensive end-organ damage includes kidney disease and PVD. Identifiable causes of hypertension include chronic renal disease.    Review of systems  Constitutional: + rapidly decreasing body weight (nearly 20 lbs in last 2 months),  current Body mass index is 28.9  kg/m. , + fatigue, no subjective hyperthermia, no subjective hypothermia Eyes: no blurry vision, no xerophthalmia ENT: no sore throat, no nodules palpated in throat, no dysphagia/odynophagia, no hoarseness Cardiovascular: no chest pain, no shortness of breath, no palpitations, + leg swelling Respiratory: no cough, no shortness of breath Gastrointestinal: no nausea/vomiting/diarrhea Musculoskeletal: no muscle/joint aches, walks with cane Skin: no rashes, no hyperemia Neurological: no tremors, no numbness, no tingling, no dizziness Psychiatric: no depression, no anxiety  Objective:    BP 102/70 (BP Location: Right Arm, Patient Position: Sitting, Cuff Size: Large)   Pulse 80   Ht 5\' 11"  (1.803 m)   Wt 207 lb 3.2 oz (94 kg)   BMI 28.90 kg/m   Wt Readings from Last 3 Encounters:  06/16/23 207 lb 3.2 oz (94 kg)  04/21/23 223 lb (101.2 kg)  02/10/23 225 lb 12.8 oz (102.4 kg)    BP Readings from Last 3 Encounters:  06/16/23 102/70  05/03/23 (!) 96/59  04/21/23 114/66    Physical Exam- Limited  Constitutional:  Body mass index is 28.9 kg/m. , not in acute distress, normal state of mind Eyes:  EOMI, no exophthalmos Musculoskeletal: no gross deformities, strength intact in all four extremities, no gross restriction of joint movements, walks with cane Skin:  no rashes, no hyperemia Neurological: no tremor with outstretched hands   Diabetic Foot Exam - Simple   No data filed     CMP ( most recent) CMP     Component Value Date/Time   NA 141 08/06/2022 1433   NA 142 07/30/2021 0923   K 3.4 (L) 08/06/2022 1433   CL 107 08/06/2022 1433   CO2 27 08/06/2022 1433   GLUCOSE 90 08/06/2022 1433   BUN 10 08/06/2022 1433   BUN 18 07/30/2021 0923   CREATININE 1.44 (H) 08/06/2022 1433   CALCIUM 8.9 08/06/2022 1433   PROT 6.3 07/30/2021 0923   ALBUMIN 3.9 07/30/2021 0923   AST 23 07/30/2021 0923   ALT 24 07/30/2021 0923   ALKPHOS 71 07/30/2021 0923   BILITOT 0.4  07/30/2021 0923    GFRNONAA 52 (L) 08/06/2022 1433   GFRAA 73 07/02/2020 0909    Diabetic Labs (most recent): Lab Results  Component Value Date   HGBA1C 4.6 06/16/2023   HGBA1C 5.5 02/10/2023   HGBA1C 4.9 08/11/2022    Lab Results  Component Value Date   TSH 2.650 07/30/2021   TSH 2.760 07/02/2020   TSH 1.122 09/08/2017   FREET4 1.12 07/30/2021   FREET4 1.06 07/02/2020      Assessment & Plan:   1) Controlled type 2 diabetes mellitus with CKD stage 3a (HCC)  - Frank Leff. has currently uncontrolled symptomatic type 2 DM since  72 years of age.  He presents today with his meter and logs showing tight glycemic profile overall.  His POCT A1c today is 4.6%, improving from last visit of 5.5%.  A1c may be skewed due to anemia.  Analysis of his meter shows 7-day average of 108, 14-day average of 105, 30-day average of 113.  He had radiation for prostate cancer and notes his energy level has been wiped out.  He continues to lose weight and recently lost his father.  - Recent labs reviewed.  - I had a long discussion with him about the progressive nature of diabetes and the pathology behind its complications. -his diabetes is complicated by obesity/sedentary life, peripheral arterial disease with venous stasis and he remains at a high risk for more acute and chronic complications which include CAD, CVA, CKD, retinopathy, and neuropathy. These are all discussed in detail with him.  - Nutritional counseling repeated at each appointment due to patients tendency to fall back in to old habits.  - The patient admits there is a room for improvement in their diet and drink choices. -  Suggestion is made for the patient to avoid simple carbohydrates from their diet including Cakes, Sweet Desserts / Pastries, Ice Cream, Soda (diet and regular), Sweet Tea, Candies, Chips, Cookies, Sweet Pastries, Store Bought Juices, Alcohol in Excess of 1-2 drinks a day, Artificial Sweeteners, Coffee Creamer, and "Sugar-free"  Products. This will help patient to have stable blood glucose profile and potentially avoid unintended weight gain.   - I encouraged the patient to switch to unprocessed or minimally processed complex starch and increased protein intake (animal or plant source), fruits, and vegetables.   - Patient is advised to stick to a routine mealtimes to eat 3 meals a day and avoid unnecessary snacks (to snack only to correct hypoglycemia).  - I have approached him with the following individualized plan to manage  his diabetes and patient agrees:   Given his tightening glycemic profile.  Will stop his [place] today.    -He is encouraged to continue monitoring blood glucose levels once daily 2-3 times per week,  report blood glucose levels less than 70 or greater than 200 for 3 tests in a row.   - he is not a candidate for TZDs, SGLT2 inhibitors due to compromised peripheral circulation.    - Specific targets for  A1c;  LDL, HDL,  and Triglycerides were discussed with the patient.  2) Blood Pressure /Hypertension:  His blood pressure is controlled to target- tight today.  He is advised to continue Lasix 20 mg po daily as needed for fluid, and Metoprolol 25 mg po twice daily.  He is advised to adopt a low sodium diet.  He has follow up with his PCP coming up.  I think this is a great idea given his recent weakness  and rapid weight loss.  3) Lipids/Hyperlipidemia:  His most recent lipid panel from 07/30/21 shows controlled LDL of 65.  He is advised to continue Crestor 40 mg po daily at bedtime.  Side effects and precautions discussed with him.    4)  Weight/Diet:  His Body mass index is 28.9 kg/m.  -   clearly complicating his diabetes care.   he is  a candidate for weight loss. I discussed with him the fact that loss of 5 - 10% of his  current body weight will have the most impact on his diabetes management.  Exercise, and detailed carbohydrates information provided  -  detailed on discharge  instructions.  5) Chronic Care/Health Maintenance: -he is on Statin medications and is encouraged to initiate and continue to follow up with Ophthalmology, Dentist,  Podiatrist at least yearly or according to recommendations, and advised to stay away from smoking. I have recommended yearly flu vaccine and pneumonia vaccine at least every 5 years; moderate intensity exercise for up to 150 minutes weekly; and  sleep for at least 7 hours a day.  - he is advised to maintain close follow up with [name], MD for primary care needs, as well as his other providers for optimal and coordinated care.     I spent  44  minutes in the care of the patient today including review of labs from CMP, Lipids, Thyroid Function, Hematology (current and previous including abstractions from other facilities); face-to-face time discussing  his blood glucose readings/logs, discussing hypoglycemia and hyperglycemia episodes and symptoms, medications doses, his options of short and long term treatment based on the latest standards of care / guidelines;  discussion about incorporating lifestyle medicine;  and documenting the encounter. Risk reduction counseling performed per USPSTF guidelines to reduce obesity and cardiovascular risk factors.     Please refer to Patient Instructions for Blood Glucose Monitoring and Insulin/Medications Dosing Guide"  in media tab for additional information. Please  also refer to " Patient Self Inventory" in the Media  tab for reviewed elements of pertinent patient history.  Frank Leff. participated in the discussions, expressed understanding, and voiced agreement with the above plans.  All questions were answered to his satisfaction. he is encouraged to contact clinic should he have any questions or concerns prior to his return visit.   Follow up plan: - Return in about 3 months (around 09/16/2023) for Diabetes F/U with A1c in office, No previsit labs.    Ronny Bacon,  [facility] [address] [place], [place] [address] Phone: [phone] Fax: [phone]  06/16/2023, 11:02 AM

## 2023-07-01 ENCOUNTER — Emergency Department (HOSPITAL_COMMUNITY): Payer: Medicare Other

## 2023-07-01 ENCOUNTER — Other Ambulatory Visit: Payer: Self-pay

## 2023-07-01 ENCOUNTER — Inpatient Hospital Stay (HOSPITAL_COMMUNITY)
Admission: EM | Admit: 2023-07-01 | Discharge: 2023-07-15 | DRG: 327 | Disposition: A | Discharge status: Skilled Nursing Facility | Payer: Medicare Other | Attending: Internal Medicine | Admitting: Internal Medicine

## 2023-07-01 ENCOUNTER — Encounter (HOSPITAL_COMMUNITY): Payer: Self-pay | Admitting: Radiology

## 2023-07-01 DIAGNOSIS — K311 Adult hypertrophic pyloric stenosis: Principal | ICD-10-CM | POA: Diagnosis present

## 2023-07-01 DIAGNOSIS — E1122 Type 2 diabetes mellitus with diabetic chronic kidney disease: Secondary | ICD-10-CM | POA: Diagnosis present

## 2023-07-01 DIAGNOSIS — N1831 Chronic kidney disease, stage 3a: Secondary | ICD-10-CM | POA: Diagnosis present

## 2023-07-01 DIAGNOSIS — Z880 Allergy status to penicillin: Secondary | ICD-10-CM

## 2023-07-01 DIAGNOSIS — R279 Unspecified lack of coordination: Secondary | ICD-10-CM | POA: Diagnosis not present

## 2023-07-01 DIAGNOSIS — Z8042 Family history of malignant neoplasm of prostate: Secondary | ICD-10-CM

## 2023-07-01 DIAGNOSIS — I722 Aneurysm of renal artery: Secondary | ICD-10-CM | POA: Diagnosis not present

## 2023-07-01 DIAGNOSIS — E782 Mixed hyperlipidemia: Secondary | ICD-10-CM | POA: Diagnosis present

## 2023-07-01 DIAGNOSIS — Z8249 Family history of ischemic heart disease and other diseases of the circulatory system: Secondary | ICD-10-CM

## 2023-07-01 DIAGNOSIS — Z9484 Stem cells transplant status: Secondary | ICD-10-CM | POA: Diagnosis not present

## 2023-07-01 DIAGNOSIS — T18128A Food in esophagus causing other injury, initial encounter: Secondary | ICD-10-CM | POA: Diagnosis not present

## 2023-07-01 DIAGNOSIS — E11649 Type 2 diabetes mellitus with hypoglycemia without coma: Secondary | ICD-10-CM | POA: Diagnosis not present

## 2023-07-01 DIAGNOSIS — I82612 Acute embolism and thrombosis of superficial veins of left upper extremity: Secondary | ICD-10-CM | POA: Diagnosis not present

## 2023-07-01 DIAGNOSIS — I700 Atherosclerosis of aorta: Secondary | ICD-10-CM | POA: Diagnosis present

## 2023-07-01 DIAGNOSIS — C163 Malignant neoplasm of pyloric antrum: Principal | ICD-10-CM | POA: Diagnosis present

## 2023-07-01 DIAGNOSIS — K2950 Unspecified chronic gastritis without bleeding: Secondary | ICD-10-CM | POA: Diagnosis not present

## 2023-07-01 DIAGNOSIS — Z7401 Bed confinement status: Secondary | ICD-10-CM | POA: Diagnosis not present

## 2023-07-01 DIAGNOSIS — I4430 Unspecified atrioventricular block: Secondary | ICD-10-CM | POA: Diagnosis not present

## 2023-07-01 DIAGNOSIS — R600 Localized edema: Secondary | ICD-10-CM | POA: Diagnosis not present

## 2023-07-01 DIAGNOSIS — K259 Gastric ulcer, unspecified as acute or chronic, without hemorrhage or perforation: Secondary | ICD-10-CM | POA: Diagnosis not present

## 2023-07-01 DIAGNOSIS — I4891 Unspecified atrial fibrillation: Secondary | ICD-10-CM | POA: Diagnosis not present

## 2023-07-01 DIAGNOSIS — C481 Malignant neoplasm of specified parts of peritoneum: Secondary | ICD-10-CM | POA: Diagnosis not present

## 2023-07-01 DIAGNOSIS — I447 Left bundle-branch block, unspecified: Secondary | ICD-10-CM | POA: Diagnosis present

## 2023-07-01 DIAGNOSIS — Z79899 Other long term (current) drug therapy: Secondary | ICD-10-CM

## 2023-07-01 DIAGNOSIS — Z6827 Body mass index (BMI) 27.0-27.9, adult: Secondary | ICD-10-CM | POA: Diagnosis not present

## 2023-07-01 DIAGNOSIS — Z7189 Other specified counseling: Secondary | ICD-10-CM | POA: Diagnosis not present

## 2023-07-01 DIAGNOSIS — K3189 Other diseases of stomach and duodenum: Secondary | ICD-10-CM

## 2023-07-01 DIAGNOSIS — M79602 Pain in left arm: Secondary | ICD-10-CM | POA: Diagnosis not present

## 2023-07-01 DIAGNOSIS — R54 Age-related physical debility: Secondary | ICD-10-CM | POA: Diagnosis not present

## 2023-07-01 DIAGNOSIS — R627 Adult failure to thrive: Secondary | ICD-10-CM | POA: Diagnosis not present

## 2023-07-01 DIAGNOSIS — C786 Secondary malignant neoplasm of retroperitoneum and peritoneum: Secondary | ICD-10-CM | POA: Diagnosis not present

## 2023-07-01 DIAGNOSIS — E440 Moderate protein-calorie malnutrition: Secondary | ICD-10-CM | POA: Diagnosis present

## 2023-07-01 DIAGNOSIS — J45909 Unspecified asthma, uncomplicated: Secondary | ICD-10-CM | POA: Diagnosis present

## 2023-07-01 DIAGNOSIS — I4892 Unspecified atrial flutter: Secondary | ICD-10-CM | POA: Diagnosis present

## 2023-07-01 DIAGNOSIS — Z860100 Personal history of colon polyps, unspecified: Secondary | ICD-10-CM

## 2023-07-01 DIAGNOSIS — Z4682 Encounter for fitting and adjustment of non-vascular catheter: Secondary | ICD-10-CM | POA: Diagnosis not present

## 2023-07-01 DIAGNOSIS — K31A19 Gastric intestinal metaplasia without dysplasia, unspecified site: Secondary | ICD-10-CM | POA: Diagnosis not present

## 2023-07-01 DIAGNOSIS — Z515 Encounter for palliative care: Secondary | ICD-10-CM | POA: Diagnosis not present

## 2023-07-01 DIAGNOSIS — N179 Acute kidney failure, unspecified: Secondary | ICD-10-CM | POA: Diagnosis not present

## 2023-07-01 DIAGNOSIS — T508X5A Adverse effect of diagnostic agents, initial encounter: Secondary | ICD-10-CM | POA: Diagnosis not present

## 2023-07-01 DIAGNOSIS — C8330 Diffuse large B-cell lymphoma, unspecified site: Secondary | ICD-10-CM | POA: Diagnosis not present

## 2023-07-01 DIAGNOSIS — E119 Type 2 diabetes mellitus without complications: Secondary | ICD-10-CM | POA: Diagnosis not present

## 2023-07-01 DIAGNOSIS — Z8546 Personal history of malignant neoplasm of prostate: Secondary | ICD-10-CM

## 2023-07-01 DIAGNOSIS — K219 Gastro-esophageal reflux disease without esophagitis: Secondary | ICD-10-CM | POA: Diagnosis not present

## 2023-07-01 DIAGNOSIS — I10 Essential (primary) hypertension: Secondary | ICD-10-CM | POA: Diagnosis not present

## 2023-07-01 DIAGNOSIS — C169 Malignant neoplasm of stomach, unspecified: Secondary | ICD-10-CM | POA: Diagnosis not present

## 2023-07-01 DIAGNOSIS — Z8572 Personal history of non-Hodgkin lymphomas: Secondary | ICD-10-CM

## 2023-07-01 DIAGNOSIS — Z7901 Long term (current) use of anticoagulants: Secondary | ICD-10-CM

## 2023-07-01 DIAGNOSIS — Z87891 Personal history of nicotine dependence: Secondary | ICD-10-CM

## 2023-07-01 DIAGNOSIS — Z923 Personal history of irradiation: Secondary | ICD-10-CM

## 2023-07-01 DIAGNOSIS — M6281 Muscle weakness (generalized): Secondary | ICD-10-CM | POA: Diagnosis not present

## 2023-07-01 DIAGNOSIS — K5900 Constipation, unspecified: Secondary | ICD-10-CM | POA: Diagnosis not present

## 2023-07-01 DIAGNOSIS — Z801 Family history of malignant neoplasm of trachea, bronchus and lung: Secondary | ICD-10-CM

## 2023-07-01 DIAGNOSIS — I130 Hypertensive heart and chronic kidney disease with heart failure and stage 1 through stage 4 chronic kidney disease, or unspecified chronic kidney disease: Secondary | ICD-10-CM | POA: Diagnosis not present

## 2023-07-01 DIAGNOSIS — R112 Nausea with vomiting, unspecified: Secondary | ICD-10-CM | POA: Diagnosis not present

## 2023-07-01 DIAGNOSIS — C164 Malignant neoplasm of pylorus: Secondary | ICD-10-CM | POA: Diagnosis not present

## 2023-07-01 DIAGNOSIS — Z8349 Family history of other endocrine, nutritional and metabolic diseases: Secondary | ICD-10-CM

## 2023-07-01 DIAGNOSIS — I5032 Chronic diastolic (congestive) heart failure: Secondary | ICD-10-CM | POA: Diagnosis present

## 2023-07-01 DIAGNOSIS — R634 Abnormal weight loss: Secondary | ICD-10-CM | POA: Diagnosis not present

## 2023-07-01 DIAGNOSIS — R5381 Other malaise: Secondary | ICD-10-CM | POA: Diagnosis not present

## 2023-07-01 DIAGNOSIS — R933 Abnormal findings on diagnostic imaging of other parts of digestive tract: Secondary | ICD-10-CM | POA: Diagnosis not present

## 2023-07-01 DIAGNOSIS — Z9889 Other specified postprocedural states: Secondary | ICD-10-CM | POA: Diagnosis not present

## 2023-07-01 LAB — COMPREHENSIVE METABOLIC PANEL
ALT: 10 U/L (ref 0–44)
AST: 17 U/L (ref 15–41)
Albumin: 3.4 g/dL — ABNORMAL LOW (ref 3.5–5.0)
Alkaline Phosphatase: 54 U/L (ref 38–126)
Anion gap: 15 (ref 5–15)
BUN: 11 mg/dL (ref 8–23)
CO2: 23 mmol/L (ref 22–32)
Calcium: 9.2 mg/dL (ref 8.9–10.3)
Chloride: 101 mmol/L (ref 98–111)
Creatinine, Ser: 1.34 mg/dL — ABNORMAL HIGH (ref 0.61–1.24)
GFR, Estimated: 57 mL/min — ABNORMAL LOW (ref >60)
Glucose, Bld: 86 mg/dL (ref 70–99)
Potassium: 3.9 mmol/L (ref 3.5–5.1)
Sodium: 139 mmol/L (ref 135–145)
Total Bilirubin: 1 mg/dL (ref <1.2)
Total Protein: 6.3 g/dL — ABNORMAL LOW (ref 6.5–8.1)

## 2023-07-01 LAB — URINALYSIS, ROUTINE W REFLEX MICROSCOPIC
Bilirubin Urine: NEGATIVE
Glucose, UA: NEGATIVE mg/dL
Ketones, ur: 20 mg/dL — AB
Leukocytes,Ua: NEGATIVE
Nitrite: NEGATIVE
Protein, ur: 100 mg/dL — AB
Specific Gravity, Urine: 1.01 (ref 1.005–1.030)
pH: 5 (ref 5.0–8.0)

## 2023-07-01 LAB — CBC WITH DIFFERENTIAL/PLATELET
Abs Immature Granulocytes: 0 10*3/uL (ref 0.00–0.07)
Basophils Absolute: 0 10*3/uL (ref 0.0–0.1)
Basophils Relative: 1 %
Eosinophils Absolute: 0.2 10*3/uL (ref 0.0–0.5)
Eosinophils Relative: 5 %
HCT: 36.5 % — ABNORMAL LOW (ref 39.0–52.0)
Hemoglobin: 11.5 g/dL — ABNORMAL LOW (ref 13.0–17.0)
Immature Granulocytes: 0 %
Lymphocytes Relative: 11 %
Lymphs Abs: 0.4 10*3/uL — ABNORMAL LOW (ref 0.7–4.0)
MCH: 30.3 pg (ref 26.0–34.0)
MCHC: 31.5 g/dL (ref 30.0–36.0)
MCV: 96.1 fL (ref 80.0–100.0)
Monocytes Absolute: 0.5 10*3/uL (ref 0.1–1.0)
Monocytes Relative: 12 %
Neutro Abs: 2.7 10*3/uL (ref 1.7–7.7)
Neutrophils Relative %: 71 %
Platelets: 181 10*3/uL (ref 150–400)
RBC: 3.8 MIL/uL — ABNORMAL LOW (ref 4.22–5.81)
RDW: 16.1 % — ABNORMAL HIGH (ref 11.5–15.5)
WBC: 3.8 10*3/uL — ABNORMAL LOW (ref 4.0–10.5)
nRBC: 0 % (ref 0.0–0.2)

## 2023-07-01 LAB — LIPASE, BLOOD: Lipase: 34 U/L (ref 11–51)

## 2023-07-01 MED ORDER — ALBUTEROL SULFATE (2.5 MG/3ML) 0.083% IN NEBU: 2.5000 mg | INHALATION_SOLUTION | RESPIRATORY_TRACT | Status: DC | PRN

## 2023-07-01 MED ORDER — HYDRALAZINE HCL 20 MG/ML IJ SOLN: 5.0000 mg | INTRAMUSCULAR | Status: DC | PRN

## 2023-07-01 MED ORDER — KCL IN DEXTROSE-NACL 20-5-0.45 MEQ/L-%-% IV SOLN
INTRAVENOUS | Status: AC
  Filled 2023-07-01 (×2): qty 1000

## 2023-07-01 MED ORDER — ACETAMINOPHEN 325 MG PO TABS: 650.0000 mg | ORAL_TABLET | Freq: Four times a day (QID) | ORAL | Status: DC | PRN

## 2023-07-01 MED ORDER — ACETAMINOPHEN 650 MG RE SUPP: 650.0000 mg | Freq: Four times a day (QID) | RECTAL | Status: DC | PRN

## 2023-07-01 MED ORDER — IOHEXOL 300 MG/ML  SOLN
100.0000 mL | Freq: Once | INTRAMUSCULAR | Status: AC | PRN
  Administered 2023-07-01: 100 mL via INTRAVENOUS

## 2023-07-01 NOTE — ED Notes (Signed)
Called [facility] to bring potassium from main pharmacy

## 2023-07-01 NOTE — ED Provider Triage Note (Signed)
Emergency Medicine Provider Triage Evaluation Note  [name] , a 72 y.o. male  was evaluated in triage.  Pt complains of weight loss x 2 to 3 weeks, decreased appetite, nausea, and abdominal discomfort.  States that he feels constipated has not had a good bowel movement in several days.  Feels excessive fullness when he eats.  Food also makes him feel like he wants to throw up.  Endorses 13 pound weight loss and 2 to 3 weeks.  Feels as though his stomach "bubbles" from his abdomen into his chest.  Review of Systems  Positive: Abdominal discomfort, weight loss, nausea, decreased bowel movements Negative: Fever, chest pain, shortness of breath  Physical Exam  BP 136/66   Pulse (!) 42   Temp 98.9 F (37.2 C) (Oral)   Resp 18   Ht 5\' 11"  (1.803 m)   Wt 93.9 kg   SpO2 98%   BMI 28.87 kg/m  Gen:   Awake, no distress   Resp:  Normal effort  MSK:   Moves extremities without difficulty  Other:  Abd soft, no guarding or rebound  Medical Decision Making  Medically screening exam initiated at 2:45 PM.  Appropriate orders placed.  [name]. was informed that the remainder of the evaluation will be completed by another provider, this initial triage assessment does not replace that evaluation, and the importance of remaining in the ED until their evaluation is complete.     Pauline Aus, PA-C 07/01/23 1450

## 2023-07-01 NOTE — ED Notes (Signed)
ED TO INPATIENT HANDOFF REPORT  ED Nurse Name and Phone #: Efraim Kaufmann, RN  S Name/Age/Gender [name]. 72 y.o. male Room/Bed: APA10/APA10  Code Status   Code Status: Full Code  Home/SNF/Other Home Patient oriented to: self, place, time, and situation Is this baseline? Yes   Triage Complete: Triage complete  Chief Complaint Gastric outlet obstruction [K31.1]  Triage Note Pt states that for the past month he has not been able to have a good bowel movement and when he eats he feels like it stops just above his stomach which makes him nauseated. Pt states he is just losing too much weight too fast.   Allergies Allergies  Allergen Reactions   Penicillins Rash    Level of Care/Admitting Diagnosis ED Disposition     ED Disposition  Admit   Condition  --   Comment  Hospital Area: [facility] [[id number]]  Level of Care: Med-Surg [16]  Covid Evaluation: Asymptomatic - no recent exposure (last 10 days) testing not required  Diagnosis: Gastric outlet obstruction [952841]  Admitting Physician: Chiquita Loth  Attending Physician: Randol Kern, DAWOOD S [[id number]]  Certification:: I certify this patient will need inpatient services for at least 2 midnights  Expected Medical Readiness: 07/03/2023          B Medical/Surgery History 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   autologous stem cell transplant 12/2005   Obesity, morbid (more than 100 lbs over ideal weight or BMI > 40) (HCC)    Venous insufficiency 04/29/2011   Past Surgical History:  Procedure Laterality Date   BACK SURGERY  2006   T6 vertebrae removed/titanium placed   CATARACT EXTRACTION  W/PHACO Right 03/31/2019   Procedure: CATARACT EXTRACTION PHACO AND INTRAOCULAR LENS PLACEMENT (IOC);  Surgeon: Fabio Pierce, MD;  Location: [facility];  Service: Ophthalmology;  Laterality: Right;  CDE: 3.21   CATARACT EXTRACTION W/PHACO Left 04/14/2019   Procedure: CATARACT EXTRACTION PHACO AND INTRAOCULAR LENS PLACEMENT (IOC);  Surgeon: Fabio Pierce, MD;  Location: [facility];  Service: Ophthalmology;  Laterality: Left;  left - pt knows to arrive at 7:45, CDE: 3.55   COLONOSCOPY  11/24/2004   Polyps in the left colon ablated/removed as described above.  Two submucosal lesions consistent with lipomas as described above not  manipulated./ Normal rectum   COLONOSCOPY  06/20/2012   MULTIPLE RECTAL AND COLONIC POLYPS   COLONOSCOPY N/A 03/08/2015   RMR: Capacious, redundant colon. Multiple colonic and rectosigmoid polyps removed. ablated as described above. colonic lipoma abnormal appearing terminal ileum likely a variant of normal). Howeverwith history  biopsies obtained.    ESOPHAGOGASTRODUODENOSCOPY N/A 03/08/2015   RMR: Hiatal hernia Polypoid gastric mucosa with multiple gastric polyps. largest polyp removed via snare polypectomgy hemostasis clip placed at base. Status post gastric biopsy. Status post video capsule placement.    GIVENS CAPSULE STUDY N/A 03/08/2015   Procedure: GIVENS CAPSULE STUDY;  Surgeon: Corbin Ade, MD;  Location: [facility];  Service: Endoscopy;  Laterality: N/A;   GOLD SEED IMPLANT N/A 09/08/2022   Procedure: GOLD SEED IMPLANT;  Surgeon: Malen Gauze, MD;  Location: [facility];  Service: Urology;  Laterality: N/A;   LIMBAL STEM CELL TRANSPLANT     LUNG BIOPSY  6/06   MULTIPLE TOOTH EXTRACTIONS  04/2005   port a cath placement     PORT-A-CATH REMOVAL  09/08/2012   Procedure: REMOVAL PORT-A-CATH;  Surgeon: Loreli Slot, MD;  Location: [facility] OR;  Service: Thoracic;  Laterality: N/A;   SPACE OAR INSTILLATION N/A 09/08/2022   Procedure: SPACE OAR INSTILLATION;  Surgeon:  Malen Gauze, MD;  Location: [facility];  Service: Urology;  Laterality: N/A;     A IV Location/Drains/Wounds Patient Lines/Drains/Airways Status     Active Line/Drains/Airways     Name Placement date Placement time Site Days   Peripheral IV 07/01/23 22 G 1" Anterior;Proximal;Right Forearm 07/01/23  1734  Forearm  less than 1            Intake/Output Last 24 hours No intake or output data in the 24 hours ending 07/01/23 2138  Labs/Imaging Results for orders placed or performed during the hospital encounter of 07/01/23 (from the past 48 hour(s))  CBC with Differential     Status: Abnormal   Collection Time: 07/01/23  2:51 PM  Result Value Ref Range   WBC 3.8 (L) 4.0 - 10.5 K/uL   RBC 3.80 (L) 4.22 - 5.81 MIL/uL   Hemoglobin 11.5 (L) 13.0 - 17.0 g/dL   HCT 16.1 (L) 09.6 - 04.5 %   MCV 96.1 80.0 - 100.0 fL   MCH 30.3 26.0 - 34.0 pg   MCHC 31.5 30.0 - 36.0 g/dL   RDW 40.9 (H) 81.1 - 91.4 %   Platelets 181 150 - 400 K/uL   nRBC 0.0 0.0 - 0.2 %   Neutrophils Relative % 71 %   Neutro Abs 2.7 1.7 - 7.7 K/uL   Lymphocytes Relative 11 %   Lymphs Abs 0.4 (L) 0.7 - 4.0 K/uL   Monocytes Relative 12 %   Monocytes Absolute 0.5 0.1 - 1.0 K/uL   Eosinophils Relative 5 %   Eosinophils Absolute 0.2 0.0 - 0.5 K/uL   Basophils Relative 1 %   Basophils Absolute 0.0 0.0 - 0.1 K/uL   Immature Granulocytes 0 %   Abs Immature Granulocytes 0.00 0.00 - 0.07 K/uL    Comment: Performed at [facility], [address] [place], [place] [address]  Comprehensive metabolic panel     Status: Abnormal   Collection Time: 07/01/23  2:51 PM  Result Value Ref Range   Sodium 139 135 - 145 mmol/L   Potassium 3.9 3.5 - 5.1 mmol/L   Chloride 101 98 - 111 mmol/L   CO2 23 22 - 32 mmol/L   Glucose, Bld 86 70 - 99 mg/dL    Comment: Glucose reference range applies only to samples taken after fasting for at least 8 hours.   BUN 11 8 - 23 mg/dL   Creatinine, Ser 6.21 (H) 0.61 - 1.24 mg/dL   Calcium 9.2  8.9 - 30.8 mg/dL   Total Protein 6.3 (L) 6.5 - 8.1 g/dL   Albumin 3.4 (L) 3.5 - 5.0 g/dL   AST 17 15 - 41 U/L   ALT 10 0 - 44 U/L   Alkaline Phosphatase 54 38 - 126 U/L   Total Bilirubin 1.0 <1.2 mg/dL   GFR, Estimated 57 (L) >60 mL/min    Comment: (NOTE) Calculated using the CKD-EPI Creatinine Equation (2021)    Anion gap 15 5 - 15    Comment: Performed at [facility],  [address], [place], [place] [address]  Lipase, blood     Status: None   Collection Time: 07/01/23  2:56 PM  Result Value Ref Range   Lipase 34 11 - 51 U/L    Comment: Performed at [facility], [address], [place], [place] [address]  Urinalysis, Routine w reflex microscopic -Urine, Clean Catch     Status: Abnormal   Collection Time: 07/01/23  7:20 PM  Result Value Ref Range   Color, Urine YELLOW YELLOW   APPearance HAZY (A) CLEAR   Specific Gravity, Urine 1.010 1.005 - 1.030   pH 5.0 5.0 - 8.0   Glucose, UA NEGATIVE NEGATIVE mg/dL   Hgb urine dipstick SMALL (A) NEGATIVE   Bilirubin Urine NEGATIVE NEGATIVE   Ketones, ur 20 (A) NEGATIVE mg/dL   Protein, ur 401 (A) NEGATIVE mg/dL   Nitrite NEGATIVE NEGATIVE   Leukocytes,Ua NEGATIVE NEGATIVE   RBC / HPF 0-5 0 - 5 RBC/hpf   WBC, UA 0-5 0 - 5 WBC/hpf   Bacteria, UA RARE (A) NONE SEEN   Squamous Epithelial / HPF 6-10 0 - 5 /HPF   Mucus PRESENT    Hyaline Casts, UA PRESENT     Comment: Performed at [facility], [address] [place], [place] [address]   CT ABDOMEN PELVIS W CONTRAST  Result Date: 07/01/2023 CLINICAL DATA:  Abnormal bowel movements for 1 month, nausea, unintentional weight loss, prior history of lymphoma EXAM: CT ABDOMEN AND PELVIS WITH CONTRAST TECHNIQUE: Multidetector CT imaging of the abdomen and pelvis was performed using the standard protocol following bolus administration of intravenous contrast. RADIATION DOSE REDUCTION: This exam was performed according to the departmental dose-optimization program which includes automated exposure  control, adjustment of the mA and/or kV according to patient size and/or use of iterative reconstruction technique. CONTRAST:  OMNIPAQUE IOHEXOL 300 MG/ML  SOLN COMPARISON:  08/04/2019 FINDINGS: Lower chest: There are small bilateral pleural effusions. New bilateral pleural calcifications could reflect sequela of prior pleurodesis or trauma. No acute airspace disease. Chronic scarring at the right lung base. Hepatobiliary: No focal liver abnormality is seen. No gallstones, gallbladder wall thickening, or biliary dilatation. Pancreas: Unremarkable. No pancreatic ductal dilatation or surrounding inflammatory changes. Spleen: Normal in size without focal abnormality. Adrenals/Urinary Tract: Adrenal glands are unremarkable. Kidneys are normal, without renal calculi, focal lesion, or hydronephrosis. Bladder is unremarkable. Stomach/Bowel: There is irregular wall thickening of the gastric antrum and pylorus, measuring up to 19 mm in maximal thickness. This likely results in an element of gastric outlet obstruction, with distension of the proximal stomach. Differential would include gastric lymphoma versus adenocarcinoma. Endoscopy is recommended for further evaluation. No other signs of bowel obstruction or ileus. Mild fecal retention throughout the colon. Vascular/Lymphatic: Numerous subcentimeter lymph nodes surround the gastric antral wall thickening, measuring up to 9 mm in short axis reference image 37/2. Additionally, there are numerous mesenteric nodules throughout the right upper quadrant which could reflect additional mesenteric adenopathy versus peritoneal carcinomatosis. Largest area measures 2.4 x 2.9 cm reference image 39/2. There is diffuse atherosclerosis of the aorta and its branches. Incidental 1 cm aneurysm extending inferiorly from the main right renal artery, reference image 58/4, not significantly changed. Reproductive: Prostate is unremarkable. Fiduciary markers are seen within the prostate.  Other: Trace free fluid within the lower pelvis. No free intraperitoneal gas. No abdominal wall hernia. Musculoskeletal: There are no acute or destructive bony abnormalities. Postsurgical changes are again noted within the midthoracic spine. Reconstructed images demonstrate no additional findings.  IMPRESSION: 1. New irregular mural thickening throughout the gastric antrum, concerning for gastric carcinoma versus gastric lymphoma. Endoscopy is recommended for further evaluation. The mural thickening likely results in an element of gastric outlet obstruction, with distension of the proximal stomach. 2. Numerous subcentimeter lymph nodes surrounding the thickened gastric antrum, as well as numerous mesenteric nodules throughout the right upper quadrant, consistent with lymphadenopathy and carcinomatosis. 3. Small bilateral pleural effusions, with new pleural calcifications bilaterally likely sequela of interval pleurodesis or trauma. 4. 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 5.  Aortic Atherosclerosis (ICD10-I70.0). Electronically Signed   By: Sharlet Salina M.D.   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,   R        Signed and Held   Signed and Held  CBC  Tomorrow morning,   R        Signed and Held            Vitals/Pain Today's Vitals   07/01/23 1945 07/01/23 2135 07/01/23 2136 07/01/23 2136  BP:  (!) 147/128    Pulse: 80 79 79   Resp:  20 20   Temp:    97.7 F (36.5 C)  TempSrc:    Oral  SpO2: 97% 98% 98%   Weight:      Height:      PainSc:        Isolation Precautions No active isolations  Medications Medications  dextrose 5 % and 0.45 % NaCl with KCl 20 mEq/L infusion (has no administration in time range)  iohexol (OMNIPAQUE) 300 MG/ML solution 100 mL (100 mLs Intravenous Contrast Given 07/01/23 1751)    Mobility walks  with device     Focused Assessments GI   R Recommendations: See Admitting Provider Note  Report given to:   Additional Notes: .

## 2023-07-01 NOTE — H&P (Addendum)
TRH H&P   Patient Demographics:    [name], is a 72 y.o. male  MRN: [id number]   DOB - Feb 27, 1951  Admit Date - 07/01/2023  Outpatient Primary MD for the patient is [name], MD  Referring MD/NP/PA: PA Tammy  Patient coming from: Home  Chief Complaint  Patient presents with   Constipation      HPI:    [name]  is a 72 y.o. male,  with medical history significant for hypertension, atrial flutter on Eliquis, diastolic heart failure, diabetes mellitus type 2, diffuse large B-cell lymphoma status post stem cell transplant 2007, iron deficiency anemia, recent diagnosis of prostate cancer, status postradiation . -Patient presents to ED secondary to complaints of early satiety, bloating, poor appetite and oral intake, belching, nausea, but no vomiting, as well he does report bloating, he denies fever, chills, he reports significant weight loss due to poor oral intake from his GI symptoms. -In ED workup significant for creatinine at 1.34 which is around her baseline, hemoglobin stable at 11.5, white blood cell count at 3.8, patient had CT abdomen pelvis done which was significant for irregular thickening throughout the gastric antrum concerning for gastric carcinoma versus gastric lymphoma, with an element of gastric outlet obstruction with surrounding lymphadenopathy, possible carcinomatosis.   Review of systems:      A full 10 point Review of Systems was done, except as stated above, all other Review of Systems were negative.   With Past History of the following :    Past Medical History:  Diagnosis Date   Asthma    as child   Diabetes mellitus without complication (HCC)    DLBCL (diffuse large B cell lymphoma) (HCC) 02/28/2009   Edema    Heart failure, diastolic, chronic (HCC)    patient denies   Hyperlipidemia, mixed    Hypertension    IDA (iron deficiency  anemia)    Large cell lymphoma (HCC) 01/2005   autologous stem cell transplant 12/2005   Obesity, morbid (more than 100 lbs over ideal weight or BMI > 40) (HCC)    Venous insufficiency 04/29/2011      Past Surgical History:  Procedure Laterality Date   BACK SURGERY  2006   T6 vertebrae removed/titanium placed   CATARACT EXTRACTION W/PHACO Right 03/31/2019   Procedure: CATARACT EXTRACTION PHACO AND INTRAOCULAR LENS PLACEMENT (IOC);  Surgeon: Fabio Pierce, MD;  Location: [facility];  Service: Ophthalmology;  Laterality: Right;  CDE: 3.21   CATARACT EXTRACTION W/PHACO Left 04/14/2019   Procedure: CATARACT EXTRACTION PHACO AND INTRAOCULAR LENS PLACEMENT (IOC);  Surgeon: Fabio Pierce, MD;  Location: [facility];  Service: Ophthalmology;  Laterality: Left;  left - pt knows to arrive at 7:45, CDE: 3.55   COLONOSCOPY  11/24/2004   Polyps in the left colon ablated/removed as described above.  Two submucosal lesions consistent with lipomas as described above not  manipulated./ Normal  rectum   COLONOSCOPY  06/20/2012   MULTIPLE RECTAL AND COLONIC POLYPS   COLONOSCOPY N/A 03/08/2015   RMR: Capacious, redundant colon. Multiple colonic and rectosigmoid polyps removed. ablated as described above. colonic lipoma abnormal appearing terminal ileum likely a variant of normal). Howeverwith history  biopsies obtained.    ESOPHAGOGASTRODUODENOSCOPY N/A 03/08/2015   RMR: Hiatal hernia Polypoid gastric mucosa with multiple gastric polyps. largest polyp removed via snare polypectomgy hemostasis clip placed at base. Status post gastric biopsy. Status post video capsule placement.    GIVENS CAPSULE STUDY N/A 03/08/2015   Procedure: GIVENS CAPSULE STUDY;  Surgeon: Corbin Ade, MD;  Location: [facility];  Service: Endoscopy;  Laterality: N/A;   GOLD SEED IMPLANT N/A 09/08/2022   Procedure: GOLD SEED IMPLANT;  Surgeon: Malen Gauze, MD;  Location: [facility];  Service: Urology;  Laterality: N/A;   LIMBAL STEM CELL TRANSPLANT      LUNG BIOPSY  6/06   MULTIPLE TOOTH EXTRACTIONS  04/2005   port a cath placement     PORT-A-CATH REMOVAL  09/08/2012   Procedure: REMOVAL PORT-A-CATH;  Surgeon: Loreli Slot, MD;  Location: [facility];  Service: Thoracic;  Laterality: N/A;   SPACE OAR INSTILLATION N/A 09/08/2022   Procedure: SPACE OAR INSTILLATION;  Surgeon: Malen Gauze, MD;  Location: [facility];  Service: Urology;  Laterality: N/A;      Social History:     Social History   Tobacco Use   Smoking status: Former    Current packs/day: 0.00    Average packs/day: 0.5 packs/day for 15.0 years (7.5 ttl pk-yrs)    Types: Cigarettes    Start date: 11/11/1987    Quit date: 11/11/2002    Years since quitting: 20.6   Smokeless tobacco: Never  Substance Use Topics   Alcohol use: No    Alcohol/week: 0.0 standard drinks of alcohol        Family History :     Family History  Problem Relation Age of Onset   Cancer Mother        lung   Hypertension Mother    Hyperlipidemia Mother    Cancer Father        prostate   Hypertension Father    Colon cancer Neg Hx    Diabetes Neg Hx       Home Medications:   Prior to Admission medications   Medication Sig Start Date End Date Taking? Authorizing Provider  apixaban (ELIQUIS) 5 MG TABS tablet Take 1 tablet (5 mg total) by mouth 2 (two) times daily. 01/24/18  Yes Laqueta Linden, MD  FEROSUL 325 (65 Fe) MG tablet Take 325 mg by mouth 2 (two) times daily. 04/27/23  Yes [provider]  folic acid (FOLVITE) 1 MG tablet TAKE 1 TABLET(1 MG) BY MOUTH DAILY 04/26/23  Yes Halter, Renda Rolls, NP  furosemide (LASIX) 20 MG tablet Take 1 tablet (20 mg total) by mouth daily as needed for fluid or edema. 07/30/22  Yes Strader, [place] M, PA-C  potassium chloride (KLOR-CON) 10 MEQ tablet Take 10 meq on days you need to take Lasix Patient taking differently: 10 mEq. Take 10 meq on days you need to take Lasix 08/07/22  Yes Strader, [place] M, PA-C  rosuvastatin (CRESTOR) 40  MG tablet Take 40 mg by mouth daily. 05/04/19  Yes [provider]  vitamin B-12 (CYANOCOBALAMIN) 1000 MCG tablet Take 2 tablets (2,000 mcg total) by mouth daily. 06/05/21  Yes Doreatha Massed, MD  Vitamin D,  Ergocalciferol, (DRISDOL) 1.25 MG (50000 UNIT) CAPS capsule TAKE 1 CAPSULE BY MOUTH ONCE WEEKLY 08/18/22  Yes Doreatha Massed, MD  Blood Glucose Monitoring Suppl (ONE TOUCH ULTRA 2) w/Device KIT USE TO TEST BLOOD SUGAR ONCE D UTD 05/25/19   [provider]  Lancets (ONETOUCH DELICA PLUS LANCET33G) MISC USE TO TEST TWICE DAILY 11/24/22   Dani Gobble, NP  [facility] ULTRA test strip TEST TWICE DAILY 11/22/20   Roma Kayser, MD     Allergies:     Allergies  Allergen Reactions   Penicillins Rash     Physical Exam:   Vitals  Blood pressure (!) 142/85, pulse 80, temperature 98.9 F (37.2 C), temperature source Oral, resp. rate 17, height 5\' 11"  (1.803 m), weight 93.9 kg, SpO2 97%.   1. General Elderly male, laying in bed, no apparent distress  2. Normal affect and insight, Not Suicidal or Homicidal, Awake Alert, Oriented X 3.  3. No F.N deficits, ALL C.Nerves Intact, Strength 5/5 all 4 extremities, Sensation intact all 4 extremities, Plantars down going.  4. Ears and Eyes appear Normal, Conjunctivae clear, PERRLA. Moist Oral Mucosa.  5. Supple Neck, No JVD, No cervical lymphadenopathy appriciated, No Carotid Bruits.  6. Symmetrical Chest wall movement, Good air movement bilaterally, CTAB.  7. RRR, No Gallops, Rubs or Murmurs, No Parasternal Heave.  8. Positive Bowel Sounds, Abdomen Soft, No tenderness, No organomegaly appriciated,No rebound -guarding or rigidity.  9.  No Cyanosis, Normal Skin Turgor, No Skin Rash or Bruise.  10. Good muscle tone,  joints appear normal , no effusions, Normal ROM.     CBC Recent Labs  Lab 07/01/23 1451  WBC 3.8*  HGB 11.5*  HCT 36.5*  PLT 181  MCV 96.1  MCH 30.3  MCHC 31.5  RDW 16.1*   LYMPHSABS 0.4*  MONOABS 0.5  EOSABS 0.2  BASOSABS 0.0   ------------------------------------------------------------------------------------------------------------------  Chemistries  Recent Labs  Lab 07/01/23 1451  NA 139  K 3.9  CL 101  CO2 23  GLUCOSE 86  BUN 11  CREATININE 1.34*  CALCIUM 9.2  AST 17  ALT 10  ALKPHOS 54  BILITOT 1.0   ------------------------------------------------------------------------------------------------------------------ estimated creatinine clearance is 59.1 mL/min (A) (by C-G formula based on SCr of 1.34 mg/dL (H)). ------------------------------------------------------------------------------------------------------------------ No results for input(s): "TSH", "T4TOTAL", "T3FREE", "THYROIDAB" in the last 72 hours.  Invalid input(s): "FREET3"  Coagulation profile No results for input(s): "INR", "PROTIME" in the last 168 hours. ------------------------------------------------------------------------------------------------------------------- No results for input(s): "DDIMER" in the last 72 hours. -------------------------------------------------------------------------------------------------------------------  Cardiac Enzymes No results for input(s): "CKMB", "TROPONINI", "MYOGLOBIN" in the last 168 hours.  Invalid input(s): "CK" ------------------------------------------------------------------------------------------------------------------    Component Value Date/Time   BNP 140.0 (H) 08/06/2022 1433     ---------------------------------------------------------------------------------------------------------------  Urinalysis    Component Value Date/Time   COLORURINE YELLOW 07/01/2023 1920   APPEARANCEUR HAZY (A) 07/01/2023 1920   APPEARANCEUR Clear 05/03/2023 1056   LABSPEC 1.010 07/01/2023 1920   PHURINE 5.0 07/01/2023 1920   GLUCOSEU NEGATIVE 07/01/2023 1920   HGBUR SMALL (A) 07/01/2023 1920   BILIRUBINUR NEGATIVE  07/01/2023 1920   BILIRUBINUR Negative 05/03/2023 1056   KETONESUR 20 (A) 07/01/2023 1920   PROTEINUR 100 (A) 07/01/2023 1920   NITRITE NEGATIVE 07/01/2023 1920   LEUKOCYTESUR NEGATIVE 07/01/2023 1920    ----------------------------------------------------------------------------------------------------------------   Imaging Results:    CT ABDOMEN PELVIS W CONTRAST  Result Date: 07/01/2023 CLINICAL DATA:  Abnormal bowel movements for 1 month, nausea, unintentional weight loss, prior history of lymphoma EXAM:  CT ABDOMEN AND PELVIS WITH CONTRAST TECHNIQUE: Multidetector CT imaging of the abdomen and pelvis was performed using the standard protocol following bolus administration of intravenous contrast. RADIATION DOSE REDUCTION: This exam was performed according to the departmental dose-optimization program which includes automated exposure control, adjustment of the mA and/or kV according to patient size and/or use of iterative reconstruction technique. CONTRAST:  OMNIPAQUE IOHEXOL 300 MG/ML  SOLN COMPARISON:  08/04/2019 FINDINGS: Lower chest: There are small bilateral pleural effusions. New bilateral pleural calcifications could reflect sequela of prior pleurodesis or trauma. No acute airspace disease. Chronic scarring at the right lung base. Hepatobiliary: No focal liver abnormality is seen. No gallstones, gallbladder wall thickening, or biliary dilatation. Pancreas: Unremarkable. No pancreatic ductal dilatation or surrounding inflammatory changes. Spleen: Normal in size without focal abnormality. Adrenals/Urinary Tract: Adrenal glands are unremarkable. Kidneys are normal, without renal calculi, focal lesion, or hydronephrosis. Bladder is unremarkable. Stomach/Bowel: There is irregular wall thickening of the gastric antrum and pylorus, measuring up to 19 mm in maximal thickness. This likely results in an element of gastric outlet obstruction, with distension of the proximal stomach. Differential  would include gastric lymphoma versus adenocarcinoma. Endoscopy is recommended for further evaluation. No other signs of bowel obstruction or ileus. Mild fecal retention throughout the colon. Vascular/Lymphatic: Numerous subcentimeter lymph nodes surround the gastric antral wall thickening, measuring up to 9 mm in short axis reference image 37/2. Additionally, there are numerous mesenteric nodules throughout the right upper quadrant which could reflect additional mesenteric adenopathy versus peritoneal carcinomatosis. Largest area measures 2.4 x 2.9 cm reference image 39/2. There is diffuse atherosclerosis of the aorta and its branches. Incidental 1 cm aneurysm extending inferiorly from the main right renal artery, reference image 58/4, not significantly changed. Reproductive: Prostate is unremarkable. Fiduciary markers are seen within the prostate. Other: Trace free fluid within the lower pelvis. No free intraperitoneal gas. No abdominal wall hernia. Musculoskeletal: There are no acute or destructive bony abnormalities. Postsurgical changes are again noted within the midthoracic spine. Reconstructed images demonstrate no additional findings. IMPRESSION: 1. New irregular mural thickening throughout the gastric antrum, concerning for gastric carcinoma versus gastric lymphoma. Endoscopy is recommended for further evaluation. The mural thickening likely results in an element of gastric outlet obstruction, with distension of the proximal stomach. 2. Numerous subcentimeter lymph nodes surrounding the thickened gastric antrum, as well as numerous mesenteric nodules throughout the right upper quadrant, consistent with lymphadenopathy and carcinomatosis. 3. Small bilateral pleural effusions, with new pleural calcifications bilaterally likely sequela of interval pleurodesis or trauma. 4. 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 5.  Aortic Atherosclerosis (ICD10-I70.0). Electronically Signed    By: Sharlet Salina M.D.   On: 07/01/2023 20:12      Assessment & Plan:    Principal Problem:   Gastric outlet obstruction Active Problems:   Mixed hyperlipidemia   DIASTOLIC HEART FAILURE, CHRONIC   Essential hypertension, benign   Aortic atherosclerosis (HCC)    Gastric output obstruction concern for gastric malignancy Nearly satiety/weight loss Failure to thrive -CT abdomen pelvis with concern of gastric outlet obstruction, with possible malignancy and lymphadenopathy -patient will be kept n.p.o. as unable tolerate any oral intake and plan for endoscopy by GI tomorrow as ED discussed with Dr. [name] -Will hold Eliquis, last dose was this morning -Continue with IV fluid  History of autoimmune hemolytic anemia -Hgb  is stable  Bilateral pleural effusion -Patient is currently on room air, but this may need further workup if  malignancy is confirmed if this is related to malignancy.  History of prostate cancer -Patient received radiation therapy earlier this year, per him and his wife report his PSA has been within normal limit, he is following with urology   History of Diffuse large B-cell lymphoma: Diagnosed in November 2006, treated with R-CHOP chemotherapy with incomplete response.  He is followed by Dr [name].   Atrial flutter on Eliquis, rate controlled - Will hold Eliquis, as well need EGD -Does not appear to be on any AV blocking agents.   DM2, not on insulin -He will be n.p.o., will just monitor CBG and if needed will start on sliding scale   HLD Resume statin when able to take oral   DVT Prophylaxis SCD, Eliquis on Hold  AM Labs Ordered, also please review Full Orders  Family Communication: Admission, patients condition and plan of care including tests being ordered have been discussed with the patient and wife by phone* who indicate understanding and agree with the plan and Code Status.  Code Status full code  Likely DC to home  Condition  GUARDED  Consults called: GI called by ED  Admission status: Inpatient  Time spent in minutes : 70 minutes   Huey Bienenstock M.D on 07/01/2023 at 9:34 PM   [facility] - Office  [phone]

## 2023-07-01 NOTE — Plan of Care (Signed)
  Problem: Education: Goal: Knowledge of General Education information will improve Description Including pain rating scale, medication(s)/side effects and non-pharmacologic comfort measures Outcome: Progressing   Problem: Health Behavior/Discharge Planning: Goal: Ability to manage health-related needs will improve Outcome: Progressing   

## 2023-07-01 NOTE — ED Triage Notes (Signed)
Pt states that for the past month he has not been able to have a good bowel movement and when he eats he feels like it stops just above his stomach which makes him nauseated. Pt states he is just losing too much weight too fast.

## 2023-07-01 NOTE — ED Provider Notes (Signed)
**[name]** Frank [name]   CSN: [id number] Arrival date & time: 07/01/23  1421     History  Chief Complaint  Patient presents with   Constipation    Frank [name]. is a 72 y.o. male.  71 year old male with history of DM, large cell lymphoma, HTN, HLD, anemia, asthma presents with complaint of vomiting, loss of appetite, weight loss, generalized weakness. States he is concerned that everything he eats sits in his stomach too long, sometimes has vomiting, has had decrease in stool output. Last bowel movement was today but was less than usual. Has been losing weight but unable to say how much. Went to his urologist and endocrinologist, metformin and insulin were stopped. He was told to go to the ER for quicker work up. No fevers, no recent falls. No other complaints or concerns.        Home Medications Prior to Admission medications   Medication Sig Start Date End Date Taking? Authorizing Provider  apixaban (ELIQUIS) 5 MG TABS tablet Take 1 tablet (5 mg total) by mouth 2 (two) times daily. 01/24/18   Laqueta Linden, MD  Blood Glucose Monitoring Suppl (ONE TOUCH ULTRA 2) w/Device KIT USE TO TEST BLOOD SUGAR ONCE D UTD 05/25/19   [provider]  clotrimazole-betamethasone (LOTRISONE) cream Apply 1 application  topically 2 (two) times daily as needed (leg infection). Patient not taking: Reported on 04/21/2023 09/15/18   [provider]  FEROSUL 325 (65 Fe) MG tablet Take 325 mg by mouth 2 (two) times daily. 04/27/23   [provider]  folic acid (FOLVITE) 1 MG tablet TAKE 1 TABLET(1 MG) BY MOUTH DAILY 04/26/23   Mauro Kaufmann, NP  furosemide (LASIX) 20 MG tablet Take 1 tablet (20 mg total) by mouth daily as needed for fluid or edema. 07/30/22   Strader, [place] M, PA-C  Lancets (ONETOUCH DELICA PLUS LANCET33G) MISC USE TO TEST TWICE DAILY 11/24/22   Dani Gobble, NP  metoprolol tartrate (LOPRESSOR) 25 MG  tablet Take 1 tablet (25 mg total) by mouth 2 (two) times daily. 09/23/17 02/10/23  Strader, Lennart Pall, PA-C  ONETOUCH ULTRA test strip TEST TWICE DAILY 11/22/20   Roma Kayser, MD  potassium chloride (KLOR-CON) 10 MEQ tablet Take 10 meq on days you need to take Lasix Patient taking differently: 10 mEq. Take 10 meq on days you need to take Lasix 08/07/22   Strader, [place] M, PA-C  rosuvastatin (CRESTOR) 40 MG tablet Take 40 mg by mouth daily. 05/04/19   [provider]  vitamin B-12 (CYANOCOBALAMIN) 1000 MCG tablet Take 2 tablets (2,000 mcg total) by mouth daily. 06/05/21   Doreatha Massed, MD  Vitamin D, Ergocalciferol, (DRISDOL) 1.25 MG (50000 UNIT) CAPS capsule TAKE 1 CAPSULE BY MOUTH ONCE WEEKLY 08/18/22   Doreatha Massed, MD      Allergies    Penicillins    Review of Systems   Review of Systems Negative except as per HPI Physical Exam Updated Vital Signs BP 136/66   Pulse (!) 42   Temp 98.9 F (37.2 C) (Oral)   Resp 18   Ht 5\' 11"  (1.803 m)   Wt 93.9 kg   SpO2 98%   BMI 28.87 kg/m  Physical Exam Vitals and nursing [name] reviewed.  Constitutional:      General: He is not in acute distress.    Appearance: He is well-developed. He is not diaphoretic.  HENT:  Head: Normocephalic and atraumatic.  Pulmonary:     Effort: Pulmonary effort is normal.  Abdominal:     Palpations: Abdomen is soft.     Tenderness: There is no abdominal tenderness.  Musculoskeletal:     Right lower leg: Edema present.     Left lower leg: Edema present.  Skin:    General: Skin is warm and dry.     Findings: No erythema or rash.  Neurological:     Mental Status: He is alert and oriented to person, place, and time.  Psychiatric:        Behavior: Behavior normal.     ED Results / Procedures / Treatments   Labs (all labs ordered are listed, but only abnormal results are displayed) Labs Reviewed  CBC WITH DIFFERENTIAL/PLATELET - Abnormal; Notable for the following  components:      Result Value   WBC 3.8 (*)    RBC 3.80 (*)    Hemoglobin 11.5 (*)    HCT 36.5 (*)    RDW 16.1 (*)    Lymphs Abs 0.4 (*)    All other components within normal limits  COMPREHENSIVE METABOLIC PANEL - Abnormal; Notable for the following components:   Creatinine, Ser 1.34 (*)    Total Protein 6.3 (*)    Albumin 3.4 (*)    GFR, Estimated 57 (*)    All other components within normal limits  LIPASE, BLOOD  URINALYSIS, ROUTINE W REFLEX MICROSCOPIC    EKG None  Radiology No results found.  Procedures Procedures    Medications Ordered in ED Medications  iohexol (OMNIPAQUE) 300 MG/ML solution 100 mL (100 mLs Intravenous Contrast Given 07/01/23 1751)    ED Course/ Medical Decision Making/ A&P                                 Medical Decision Making Amount and/or Complexity of Data Reviewed Labs: ordered. Radiology: ordered.  Risk Prescription drug management.   This patient presents to the ED for concern of generalized weakness, vomiting, weight loss, this involves an extensive number of treatment options, and is a complaint that carries with it a high risk of complications and morbidity.  The differential diagnosis includes but not limited to mass, hiatal hernia, electrolyte/metabolic abnormality    Co morbidities that complicate the patient evaluation  Large B cell lymphoma, HLD, DM, anemia, prostate CA   Additional history obtained:  External records from outside source obtained and reviewed including prior labs on file for comparison    Lab Tests:  I Ordered, and personally interpreted labs.  The pertinent results include:  CBC without significant changes. CMP with mildly elevated Cr, similar to prior on file. Lipase WNL   Imaging Studies ordered:  I ordered imaging studies including CT a/p  Results pending at time of sign out to oncoming provider.    Problem List / ED Course / Critical interventions / Medication management  72 yo male  with complaint as above. Bilateral lower ext edema reported to be at baseline. Abd soft and non tender. Labs without significant findings. CT pending at time of sign out to on coming provider.  I have reviewed the patients home medicines and have made adjustments as needed   Social Determinants of Health:  Has PCP   Test / Admission - Considered:  Disposition pending at time of sign out to oncoming provider.          Final  Clinical Impression(s) / ED Diagnoses Final diagnoses:  None    Rx / DC Orders ED Discharge Orders     None         Frank Fend, PA-C 07/01/23 1846    Jacalyn Lefevre, MD 07/02/23 1553

## 2023-07-01 NOTE — ED Provider Notes (Signed)
Patient signed out to me by Army Melia, PA-C pending CT results  Patient here with abdominal pain, nausea and weight loss.  Symptoms for 2 to 3 weeks.  Feels as though he is constipated as he has not had a "good bowel movement" in several days.  No fever or chills.  No dysuria denies any black or bloody stools.  See previous provider note for complete H&P.  CT abdomen and pelvis was ordered for further evaluation of symptoms.   CT abdomen and pelvis results concerning for gastric cancer.  Will consult with GI  Spoke with GI, Dr. [name] recommends hospital admit, patient be n.p.o. for aspiration risk.and Plan for endoscopy tomorrow  Discussed with [facility] hospitalist, Dr. Randol Kern who agrees to admit   CT ABDOMEN PELVIS W CONTRAST  Result Date: 07/01/2023 CLINICAL DATA:  Abnormal bowel movements for 1 month, nausea, unintentional weight loss, prior history of lymphoma EXAM: CT ABDOMEN AND PELVIS WITH CONTRAST TECHNIQUE: Multidetector CT imaging of the abdomen and pelvis was performed using the standard protocol following bolus administration of intravenous contrast. RADIATION DOSE REDUCTION: This exam was performed according to the departmental dose-optimization program which includes automated exposure control, adjustment of the mA and/or kV according to patient size and/or use of iterative reconstruction technique. CONTRAST:  OMNIPAQUE IOHEXOL 300 MG/ML  SOLN COMPARISON:  08/04/2019 FINDINGS: Lower chest: There are small bilateral pleural effusions. New bilateral pleural calcifications could reflect sequela of prior pleurodesis or trauma. No acute airspace disease. Chronic scarring at the right lung base. Hepatobiliary: No focal liver abnormality is seen. No gallstones, gallbladder wall thickening, or biliary dilatation. Pancreas: Unremarkable. No pancreatic ductal dilatation or surrounding inflammatory changes. Spleen: Normal in size without focal abnormality. Adrenals/Urinary Tract: Adrenal  glands are unremarkable. Kidneys are normal, without renal calculi, focal lesion, or hydronephrosis. Bladder is unremarkable. Stomach/Bowel: There is irregular wall thickening of the gastric antrum and pylorus, measuring up to 19 mm in maximal thickness. This likely results in an element of gastric outlet obstruction, with distension of the proximal stomach. Differential would include gastric lymphoma versus adenocarcinoma. Endoscopy is recommended for further evaluation. No other signs of bowel obstruction or ileus. Mild fecal retention throughout the colon. Vascular/Lymphatic: Numerous subcentimeter lymph nodes surround the gastric antral wall thickening, measuring up to 9 mm in short axis reference image 37/2. Additionally, there are numerous mesenteric nodules throughout the right upper quadrant which could reflect additional mesenteric adenopathy versus peritoneal carcinomatosis. Largest area measures 2.4 x 2.9 cm reference image 39/2. There is diffuse atherosclerosis of the aorta and its branches. Incidental 1 cm aneurysm extending inferiorly from the main right renal artery, reference image 58/4, not significantly changed. Reproductive: Prostate is unremarkable. Fiduciary markers are seen within the prostate. Other: Trace free fluid within the lower pelvis. No free intraperitoneal gas. No abdominal wall hernia. Musculoskeletal: There are no acute or destructive bony abnormalities. Postsurgical changes are again noted within the midthoracic spine. Reconstructed images demonstrate no additional findings. IMPRESSION: 1. New irregular mural thickening throughout the gastric antrum, concerning for gastric carcinoma versus gastric lymphoma. Endoscopy is recommended for further evaluation. The mural thickening likely results in an element of gastric outlet obstruction, with distension of the proximal stomach. 2. Numerous subcentimeter lymph nodes surrounding the thickened gastric antrum, as well as numerous  mesenteric nodules throughout the right upper quadrant, consistent with lymphadenopathy and carcinomatosis. 3. Small bilateral pleural effusions, with new pleural calcifications bilaterally likely sequela of interval pleurodesis or trauma. 4. 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 5.  Aortic Atherosclerosis (ICD10-I70.0). Electronically Signed   By: Sharlet Salina M.D.   On: 07/01/2023 20:12      Pauline Aus, PA-C 07/01/23 2100    Jacalyn Lefevre, MD 07/02/23 1553

## 2023-07-02 ENCOUNTER — Encounter (HOSPITAL_COMMUNITY)
Admission: EM | Disposition: A | Discharge status: Skilled Nursing Facility | Payer: Self-pay | Source: Home / Self Care | Attending: Internal Medicine

## 2023-07-02 ENCOUNTER — Other Ambulatory Visit: Payer: Self-pay

## 2023-07-02 ENCOUNTER — Inpatient Hospital Stay (HOSPITAL_COMMUNITY): Payer: Medicare Other | Admitting: Anesthesiology

## 2023-07-02 ENCOUNTER — Encounter (HOSPITAL_COMMUNITY): Payer: Self-pay | Admitting: Internal Medicine

## 2023-07-02 DIAGNOSIS — I5032 Chronic diastolic (congestive) heart failure: Secondary | ICD-10-CM | POA: Diagnosis not present

## 2023-07-02 DIAGNOSIS — R112 Nausea with vomiting, unspecified: Secondary | ICD-10-CM | POA: Diagnosis not present

## 2023-07-02 DIAGNOSIS — R634 Abnormal weight loss: Secondary | ICD-10-CM

## 2023-07-02 DIAGNOSIS — C164 Malignant neoplasm of pylorus: Secondary | ICD-10-CM

## 2023-07-02 DIAGNOSIS — I10 Essential (primary) hypertension: Secondary | ICD-10-CM

## 2023-07-02 DIAGNOSIS — E440 Moderate protein-calorie malnutrition: Secondary | ICD-10-CM | POA: Insufficient documentation

## 2023-07-02 DIAGNOSIS — T18128A Food in esophagus causing other injury, initial encounter: Secondary | ICD-10-CM

## 2023-07-02 DIAGNOSIS — K2950 Unspecified chronic gastritis without bleeding: Secondary | ICD-10-CM | POA: Diagnosis not present

## 2023-07-02 DIAGNOSIS — K311 Adult hypertrophic pyloric stenosis: Secondary | ICD-10-CM | POA: Diagnosis not present

## 2023-07-02 DIAGNOSIS — R933 Abnormal findings on diagnostic imaging of other parts of digestive tract: Secondary | ICD-10-CM

## 2023-07-02 DIAGNOSIS — K3189 Other diseases of stomach and duodenum: Secondary | ICD-10-CM | POA: Diagnosis not present

## 2023-07-02 DIAGNOSIS — C163 Malignant neoplasm of pyloric antrum: Secondary | ICD-10-CM | POA: Diagnosis not present

## 2023-07-02 HISTORY — PX: ESOPHAGOGASTRODUODENOSCOPY (EGD) WITH PROPOFOL: SHX5813

## 2023-07-02 HISTORY — PX: BIOPSY: SHX5522

## 2023-07-02 LAB — BASIC METABOLIC PANEL
Anion gap: 10 (ref 5–15)
BUN: 11 mg/dL (ref 8–23)
CO2: 26 mmol/L (ref 22–32)
Calcium: 8.6 mg/dL — ABNORMAL LOW (ref 8.9–10.3)
Chloride: 102 mmol/L (ref 98–111)
Creatinine, Ser: 1.21 mg/dL (ref 0.61–1.24)
GFR, Estimated: >60 mL/min (ref >60)
Glucose, Bld: 87 mg/dL (ref 70–99)
Potassium: 3.8 mmol/L (ref 3.5–5.1)
Sodium: 138 mmol/L (ref 135–145)

## 2023-07-02 LAB — CBC
HCT: 31.8 % — ABNORMAL LOW (ref 39.0–52.0)
Hemoglobin: 10.1 g/dL — ABNORMAL LOW (ref 13.0–17.0)
MCH: 30.8 pg (ref 26.0–34.0)
MCHC: 31.8 g/dL (ref 30.0–36.0)
MCV: 97 fL (ref 80.0–100.0)
Platelets: 154 10*3/uL (ref 150–400)
RBC: 3.28 MIL/uL — ABNORMAL LOW (ref 4.22–5.81)
RDW: 16.1 % — ABNORMAL HIGH (ref 11.5–15.5)
WBC: 3.8 10*3/uL — ABNORMAL LOW (ref 4.0–10.5)
nRBC: 0 % (ref 0.0–0.2)

## 2023-07-02 LAB — GLUCOSE, CAPILLARY
Glucose-Capillary: 101 mg/dL — ABNORMAL HIGH (ref 70–99)
Glucose-Capillary: 111 mg/dL — ABNORMAL HIGH (ref 70–99)
Glucose-Capillary: 119 mg/dL — ABNORMAL HIGH (ref 70–99)
Glucose-Capillary: 77 mg/dL (ref 70–99)
Glucose-Capillary: 82 mg/dL (ref 70–99)
Glucose-Capillary: 84 mg/dL (ref 70–99)
Glucose-Capillary: 90 mg/dL (ref 70–99)

## 2023-07-02 LAB — PHOSPHORUS: Phosphorus: 2.6 mg/dL (ref 2.5–4.6)

## 2023-07-02 SURGERY — ESOPHAGOGASTRODUODENOSCOPY (EGD) WITH PROPOFOL
Anesthesia: General

## 2023-07-02 MED ORDER — MIDAZOLAM HCL 2 MG/2ML IJ SOLN
INTRAMUSCULAR | Status: DC | PRN
  Administered 2023-07-02: 2 mg via INTRAVENOUS

## 2023-07-02 MED ORDER — PROPOFOL 10 MG/ML IV BOLUS
INTRAVENOUS | Status: DC | PRN
  Administered 2023-07-02: 120 mg via INTRAVENOUS

## 2023-07-02 MED ORDER — ONDANSETRON HCL 4 MG/2ML IJ SOLN
INTRAMUSCULAR | Status: DC | PRN
Start: 2023-07-02 — End: 2023-07-02
  Administered 2023-07-02: 4 mg via INTRAVENOUS

## 2023-07-02 MED ORDER — SODIUM CHLORIDE 0.9 % IV SOLN: INTRAVENOUS | Status: DC

## 2023-07-02 MED ORDER — SUCCINYLCHOLINE CHLORIDE 200 MG/10ML IV SOSY
PREFILLED_SYRINGE | INTRAVENOUS | Status: DC | PRN
Start: 2023-07-02 — End: 2023-07-02
  Administered 2023-07-02: 160 mg via INTRAVENOUS

## 2023-07-02 MED ORDER — LIDOCAINE HCL (CARDIAC) PF 100 MG/5ML IV SOSY
PREFILLED_SYRINGE | INTRAVENOUS | Status: DC | PRN
  Administered 2023-07-02: 50 mg via INTRAVENOUS

## 2023-07-02 MED ORDER — FENTANYL CITRATE (PF) 100 MCG/2ML IJ SOLN
INTRAMUSCULAR | Status: AC
  Filled 2023-07-02: qty 2

## 2023-07-02 MED ORDER — MIDAZOLAM HCL 2 MG/2ML IJ SOLN
INTRAMUSCULAR | Status: AC
  Filled 2023-07-02: qty 2

## 2023-07-02 MED ORDER — LACTATED RINGERS IV SOLN: INTRAVENOUS | Status: DC | PRN

## 2023-07-02 MED ORDER — PHENYLEPHRINE HCL (PRESSORS) 10 MG/ML IV SOLN
INTRAVENOUS | Status: DC | PRN
  Administered 2023-07-02 (×5): 160 ug via INTRAVENOUS

## 2023-07-02 MED ORDER — FENTANYL CITRATE (PF) 100 MCG/2ML IJ SOLN
INTRAMUSCULAR | Status: DC | PRN
  Administered 2023-07-02: 100 ug via INTRAVENOUS

## 2023-07-02 NOTE — TOC CM/SW Note (Signed)
Transition of Care [facility]) - Inpatient Brief Assessment   Patient Details  Name: [name]. MRN: [id number] Date of Birth: [birth year]  Transition of Care [facility]) CM/SW Contact:    Villa Herb, LCSWA Phone Number: 07/02/2023, 11:40 AM   Clinical Narrative: Transition of Care Department [facility]) has reviewed patient and no TOC needs have been identified at this time. We will continue to monitor patient advancement through interdisciplinary progression rounds. If new patient transition needs arise, please place a TOC consult.  Transition of Care Asessment: Insurance and Status: Insurance coverage has been reviewed Patient has primary care physician: Yes Home environment has been reviewed: from home Prior level of function:: independent Prior/Current Home Services: No current home services Social Determinants of Health Reivew: SDOH reviewed no interventions necessary Readmission risk has been reviewed: Yes Transition of care needs: no transition of care needs at this time

## 2023-07-02 NOTE — Op Note (Signed)
[facility] Patient Name: [name] Procedure Date: 07/02/2023 9:51 AM MRN: [id number] Date of Birth: 30-Nov-1950 Attending MD: Sanjuan Dame , MD, [id number] CSN: [id number] Age: 72 Admit Type: Inpatient Procedure:                Upper GI endoscopy Indications:              Epigastric abdominal pain, Abnormal CT of the GI                            tract, Suspected gastric outlet obstruction, Nausea                            with vomiting Providers:                Sanjuan Dame, MD, Sheran Fava, Volanda Napoleon., Technician Referring MD:              Medicines:                Monitored Anesthesia Care Complications:            No immediate complications. Estimated Blood Loss:     Estimated blood loss was minimal. Procedure:                Pre-Anesthesia Assessment:                           - Prior to the procedure, a History and Physical                            was performed, and patient medications and                            allergies were reviewed. The patient's tolerance of                            previous anesthesia was also reviewed. The risks                            and benefits of the procedure and the sedation                            options and risks were discussed with the patient.                            All questions were answered, and informed consent                            was obtained. Prior Anticoagulants: The patient has                            taken Eliquis (apixaban), last dose was 2 days  prior to procedure. ASA Grade Assessment: III - A                            patient with severe systemic disease. After                            reviewing the risks and benefits, the patient was                            deemed in satisfactory condition to undergo the                            procedure.                           After obtaining informed consent, the endoscope  was                            passed under direct vision. Throughout the                            procedure, the patient's blood pressure, pulse, and                            oxygen saturations were monitored continuously. The                            [id number]-H190 ([id number]) scope was introduced through the                            mouth, and advanced to the second part of duodenum.                            The upper GI endoscopy was accomplished without                            difficulty. The patient tolerated the procedure                            well. Scope In: 11:03:45 AM Scope Out: 11:18:13 AM Total Procedure Duration: 0 hours 14 minutes 28 seconds  Findings:      Food was found in the entire esophagus.      A large amount of food (residue) was found in the entire examined       stomach. A 16 Fr nasogastric tube was placed through the nares into the       esophagus. Under endoscopic guidance, the tube was advanced into the       stomach. Placement was confirmed by scope visualization.      Diffuse severe inflammation characterized by nodularity was found in the       entire examined stomach. Biopsies were taken with a cold forceps for       histology.      A large, fungating, infiltrative and ulcerated, circumferential mass       with no bleeding and stigmata of recent bleeding  was found in the       gastric antrum and at the pylorus. Biopsies were taken with a cold       forceps for histology.      The duodenal bulb and second portion of the duodenum were normal. Impression:               - Food in the esophagus.                           - A large amount of food (residue) in the stomach                            precluding visualization on retroflexion                           - Gastritis. Biopsied.                           - Large circumferential fungating malignant gastric                            tumor at the pylorus and in the gastric antrum.                             Able to traversed with adult endoscope . Biopsied.                           - Normal duodenal bulb and second portion of the                            duodenum.                           - Feeding tube placement was successfully performed. Moderate Sedation:      Per Anesthesia Care Recommendation:           Keep Nasogastric tube to intermittent Low wall                            suction                           Aspiration precautions                           Follow up biopsy results                           Keep NPO                           Surgical consult for gastric mass with Gastric                            outlet obstruction Procedure Code(s):        --- Professional ---  82956, Esophagogastroduodenoscopy, flexible,                            transoral; with insertion of intraluminal tube or                            catheter                           43239, Esophagogastroduodenoscopy, flexible,                            transoral; with biopsy, single or multiple Diagnosis Code(s):        --- Professional ---                           O13.086V, Food in esophagus causing other injury,                            initial encounter                           C16.4, Malignant neoplasm of pylorus                           C16.3, Malignant neoplasm of pyloric antrum                           R10.13, Epigastric pain                           R11.2, Nausea with vomiting, unspecified                           R93.3, Abnormal findings on diagnostic imaging of                            other parts of digestive tract CPT copyright 2022 [employer]. All rights reserved. The codes documented in this report are preliminary and upon coder review may  be revised to meet current compliance requirements. Sanjuan Dame, MD Sanjuan Dame, MD 07/02/2023 11:40:42 AM This report has been signed electronically. Number of Addenda: 0

## 2023-07-02 NOTE — Transfer of Care (Signed)
Immediate Anesthesia Transfer of Care Note  Patient: [name].  Procedure(s) Performed: ESOPHAGOGASTRODUODENOSCOPY (EGD) WITH PROPOFOL BIOPSY  Patient Location: PACU  Anesthesia Type:General  Level of Consciousness: awake, drowsy, and patient cooperative  Airway & Oxygen Therapy: Patient Spontanous Breathing and Patient connected to nasal cannula oxygen  Post-op Assessment: Report given to RN, Post -op Vital signs reviewed and stable, and Patient moving all extremities X 4  Post vital signs: Reviewed and stable  Last Vitals:  Vitals Value Taken Time  BP 142/67 07/02/23 1137  Temp    Pulse 84 07/02/23 1138  Resp 17 07/02/23 1138  SpO2 98 % 07/02/23 1138  Vitals shown include unfiled device data.  Last Pain:  Vitals:   07/02/23 1103  TempSrc:   PainSc: 0-No pain      Patients Stated Pain Goal: 5 (07/02/23 1008)  Complications: No notable events documented.

## 2023-07-02 NOTE — H&P (View-Only) (Signed)
Gastroenterology Consult   Referring Provider: No ref. provider found Primary Care Physician:  [name], MD Primary Gastroenterologist:  previously Dr. Jena Gauss in 2017  Patient ID: [name].; [id number]; [birth year]   Admit date: 07/01/2023  LOS: 1 day   Date of Consultation: 07/02/2023  Reason for Consultation: gastric wall thickening, concern for gastric mass, nausea/vomiting weight loss.   History of Present Illness   [name]. is a 72 y.o. year old male with history of HTN, aflutter on Eliquis, diastolic heart failure, type 2 diabetes, diffuse large B-cell lymphoma s/p stem cell transplant in 2007, IDA, prostate cancer s/p radiation from January to March who presented to the ED with complaints of early satiety, weight loss, bloating, regurgitation, nausea, and poor p.o. intake. Imaging with irregular gastric wall thickening and lymphadenopathy concerning for gastric carcinoma vs gastric lymphoma. GI consulted for further evaluation.   ED course: Labs -WBC 3.8, hemoglobin 11.5, creatinine 1.34 (close to baseline).  Normal lipase.  UA with rare bacteria and presence of proteinuria and ketonuria.  Given IV fluids CT A/P with contrast: New irregular mural thickening throughout the gastric and symptoms concerning for gastric carcinoma versus gastric lymphoma, endoscopy recommended for further evaluation (mural thickening could be causing possible gastric outlet obstruction given proximal stomach distention), numerous subcentimeter lymph nodes surrounding thickened gastric antrum and numerous mesenteric nodules throughout RUQ consistent with lymphadenopathy and carcinomatosis.  Also with bilateral small pleural effusions   Consult: For the last 2 days he has not had much intake given bloating and dry heaving. Due to this he has also not taken many of his daily medications. Has been having stomach issues since after all his prostate procedures. He received radiation from  Jan-March. He may have had some of these symptoms around April/May but thought it was reflux. Had not been on any PPI. Denies reflux symptoms currently. Tried to cut back on some greasy foods etc. He states he intermittently has had regurgitation of foods, it it happened a couple times while driving. Has been nauseas at times and has had some troubles with bowel movements. Stools began to get dark a few times within the last couple months, possibly black. He stays away from NSAIDs per patient. Denies tobacco use and alcohol use. Denies overt abdominal pain. Has had boating and discomfort. Reports lots of belching and then that leads to the regurgitation. Denies esophageal dysphagia. He repots about 2 months ago he was about 240 and then appointment before last he was 220 and then states he was 208lbs and he states since hospital admission they told him he was at 199 lbs.   Wt Readings from Last 3 Encounters:  07/01/23 90.5 kg  06/16/23 94 kg  04/21/23 101.2 kg  07/01/23         244lbs (110.7kg)   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   autologous stem cell transplant 12/2005   Obesity, morbid (more than 100 lbs over ideal weight or BMI > 40) (HCC)    Venous insufficiency 04/29/2011    Past Surgical History:  Procedure Laterality Date   BACK SURGERY  2006   T6 vertebrae removed/titanium placed   CATARACT EXTRACTION W/PHACO Right 03/31/2019   Procedure: CATARACT EXTRACTION PHACO AND INTRAOCULAR LENS PLACEMENT (IOC);  Surgeon: Fabio Pierce, MD;  Location: [facility];  Service: Ophthalmology;  Laterality: Right;  CDE: 3.21   CATARACT EXTRACTION W/PHACO Left 04/14/2019   Procedure: CATARACT  EXTRACTION PHACO AND INTRAOCULAR LENS PLACEMENT (IOC);  Surgeon: Fabio Pierce, MD;  Location: [facility];  Service: Ophthalmology;  Laterality: Left;  left - pt knows to arrive at 7:45, CDE: 3.55   COLONOSCOPY  11/24/2004   Polyps in the left colon ablated/removed as described above.  Two submucosal lesions consistent with lipomas as described above not  manipulated./ Normal rectum   COLONOSCOPY  06/20/2012   MULTIPLE RECTAL AND COLONIC POLYPS   COLONOSCOPY N/A 03/08/2015   RMR: Capacious, redundant colon. Multiple colonic and rectosigmoid polyps removed. ablated as described above. colonic lipoma abnormal appearing terminal ileum likely a variant of normal). Howeverwith history  biopsies obtained.    ESOPHAGOGASTRODUODENOSCOPY N/A 03/08/2015   RMR: Hiatal hernia Polypoid gastric mucosa with multiple gastric polyps. largest polyp removed via snare polypectomgy hemostasis clip placed at base. Status post gastric biopsy. Status post video capsule placement.    GIVENS CAPSULE STUDY N/A 03/08/2015   Procedure: GIVENS CAPSULE STUDY;  Surgeon: Corbin Ade, MD;  Location: [facility];  Service: Endoscopy;  Laterality: N/A;   GOLD SEED IMPLANT N/A 09/08/2022   Procedure: GOLD SEED IMPLANT;  Surgeon: Malen Gauze, MD;  Location: [facility];  Service: Urology;  Laterality: N/A;   LIMBAL STEM CELL TRANSPLANT     LUNG BIOPSY  6/06   MULTIPLE TOOTH EXTRACTIONS  04/2005   port a cath placement     PORT-A-CATH REMOVAL  09/08/2012   Procedure: REMOVAL PORT-A-CATH;  Surgeon: Loreli Slot, MD;  Location: [facility] OR;  Service: Thoracic;  Laterality: N/A;   SPACE OAR INSTILLATION N/A 09/08/2022   Procedure: SPACE OAR INSTILLATION;  Surgeon: Malen Gauze, MD;  Location: [facility];  Service: Urology;  Laterality: N/A;    Prior to Admission medications   Medication Sig Start Date End Date Taking? Authorizing Provider  apixaban (ELIQUIS) 5 MG TABS tablet Take 1 tablet (5 mg total) by mouth 2 (two) times  daily. 01/24/18  Yes Laqueta Linden, MD  FEROSUL 325 (65 Fe) MG tablet Take 325 mg by mouth 2 (two) times daily. 04/27/23  Yes [provider]  folic acid (FOLVITE) 1 MG tablet TAKE 1 TABLET(1 MG) BY MOUTH DAILY 04/26/23  Yes Macintyre, Renda Rolls, NP  furosemide (LASIX) 20 MG tablet Take 1 tablet (20 mg total) by mouth daily as needed for fluid or edema. 07/30/22  Yes Strader, [place] M, PA-C  potassium chloride (KLOR-CON) 10 MEQ tablet Take 10 meq on days you need to take Lasix Patient taking differently: 10 mEq. Take 10 meq on days you need to take Lasix 08/07/22  Yes Strader, [place] M, PA-C  rosuvastatin (CRESTOR) 40 MG tablet Take 40 mg by mouth daily. 05/04/19  Yes [provider]  vitamin B-12 (CYANOCOBALAMIN) 1000 MCG tablet Take 2 tablets (2,000 mcg total) by mouth daily. 06/05/21  Yes Doreatha Massed, MD  Vitamin D, Ergocalciferol, (DRISDOL) 1.25 MG (50000 UNIT) CAPS capsule TAKE 1 CAPSULE BY MOUTH ONCE WEEKLY 08/18/22  Yes Doreatha Massed, MD  Blood Glucose Monitoring Suppl (ONE TOUCH ULTRA 2) w/Device KIT USE TO TEST BLOOD SUGAR ONCE D UTD 05/25/19   [provider]  Lancets [facility] DELICA PLUS  [id number]) MISC USE TO TEST TWICE DAILY 11/24/22   Dani Gobble, NP  [facility] ULTRA test strip TEST TWICE DAILY 11/22/20   Roma Kayser, MD    Current Facility-Administered Medications  Medication Dose Route Frequency Provider Last Rate Last Admin   acetaminophen (TYLENOL) tablet 650 mg  650 mg Oral Q6H PRN Elgergawy, Leana Roe, MD       Or   acetaminophen (TYLENOL) suppository 650 mg  650 mg Rectal Q6H PRN Elgergawy, Leana Roe, MD       albuterol (PROVENTIL) (2.5 MG/3ML) 0.083% nebulizer solution 2.5 mg  2.5 mg Nebulization Q2H PRN Elgergawy, Leana Roe, MD       dextrose 5 % and 0.45 % NaCl with KCl 20 mEq/L infusion   Intravenous Continuous Elgergawy, Leana Roe, MD 75 mL/hr at 07/02/23 0311 Infusion Verify at 07/02/23 0311   hydrALAZINE (APRESOLINE)  injection 5 mg  5 mg Intravenous Q4H PRN Elgergawy, Leana Roe, MD        Allergies as of 07/01/2023 - Review Complete 07/01/2023  Allergen Reaction Noted   Penicillins Rash 04/29/2011    Family History  Problem Relation Age of Onset   Cancer Mother        lung   Hypertension Mother    Hyperlipidemia Mother    Cancer Father        prostate   Hypertension Father    Colon cancer Neg Hx    Diabetes Neg Hx     Social History   Socioeconomic History   Marital status: Married    Spouse name: Not on file   Number of children: 2   Years of education: Not on file   Highest education level: Not on file  Occupational History   Occupation: disability due to back    Employer: UNEMPLOYED  Tobacco Use   Smoking status: Former    Current packs/day: 0.00    Average packs/day: 0.5 packs/day for 15.0 years (7.5 ttl pk-yrs)    Types: Cigarettes    Start date: 11/11/1987    Quit date: 11/11/2002    Years since quitting: 20.6   Smokeless tobacco: Never  Vaping Use   Vaping status: Never Used  Substance and Sexual Activity   Alcohol use: No    Alcohol/week: 0.0 standard drinks of alcohol   Drug use: No   Sexual activity: Yes    Birth control/protection: None  Other Topics Concern   Not on file  Social History Narrative   Married   No regular exercise   Social Determinants of Health   Financial Resource Strain: Low Risk  (06/28/2020)   Overall Financial Resource Strain (CARDIA)    Difficulty of Paying Living Expenses: Not hard at all  Food Insecurity: No Food Insecurity (07/01/2023)   Hunger Vital Sign    Worried About Running Out of Food in the Last Year: Never true    Ran Out of Food in the Last Year: Never true  Transportation Needs: No Transportation Needs (07/01/2023)   PRAPARE - Administrator, Civil Service (Medical): No    Lack of Transportation (Non-Medical): No  Physical Activity: Inactive (06/28/2020)   Exercise Vital Sign    Days of Exercise per Week: 0  days    Minutes of Exercise per Session: 0 min  Stress: No Stress Concern Present (06/28/2020)   [employer] of Occupational Health - Occupational Stress Questionnaire    Feeling of Stress : Not at all  Social Connections: Moderately Integrated (06/28/2020)  Social Advertising account executive [NHANES]    Frequency of Communication with Friends and Family: More than three times a week    Frequency of Social Gatherings with Friends and Family: Twice a week    Attends Religious Services: More than 4 times per year    Active Member of [employer] or Organizations: No    Attends Banker Meetings: Never    Marital Status: Married  Catering manager Violence: Not At Risk (07/01/2023)   Humiliation, Afraid, Rape, and Kick questionnaire    Fear of Current or Ex-Partner: No    Emotionally Abused: No    Physically Abused: No    Sexually Abused: No     Review of Systems   Gen: + weight loss, fatigues, weakness, lack of appetite.  Denies any fever, chills. CV: Denies chest pain, heart palpitations, syncope, edema  Resp: + shortness of breath. Denies cough, wheezing, coughing up blood, and pleurisy. GI: see HPI GU : + nocturia. Denies urinary burning, blood in urine, and urinary incontinence. MS: Denies joint pain, limitation of movement, swelling, cramps, and atrophy.  Derm: Denies rash, itching, dry skin, hives. Psych: Denies depression, anxiety, memory loss, hallucinations, and confusion. Heme: Denies bruising or bleeding Neuro:  Denies any headaches, dizziness, paresthesias, shaking  Physical Exam   Vital Signs in last 24 hours: Temp:  [97.6 F (36.4 C)-98.9 F (37.2 C)] 98.3 F (36.8 C) (11/08 0617) Pulse Rate:  [42-97] 83 (11/08 0617) Resp:  [16-20] 18 (11/08 0617) BP: (136-168)/(63-128) 142/64 (11/08 0617) SpO2:  [92 %-100 %] 100 % (11/08 0617) Weight:  [90.5 kg-93.9 kg] 90.5 kg (11/07 2216) Last BM Date : 06/30/23  General:   Alert,  pleasant and cooperative in  NAD Head:  Normocephalic and atraumatic. Eyes:  Sclera clear, no icterus.   Conjunctiva pink. Ears:  Normal auditory acuity. Neck:  Supple; no masses Lungs:  Clear throughout to auscultation.   No wheezes, crackles, or rhonchi. No acute distress. Heart:  Regular rate and rhythm; no murmurs, clicks, rubs,  or gallops. Abdomen:  Soft, nontender and nondistended. No masses, hepatosplenomegaly or hernias noted. Normal bowel sounds, without guarding, and without rebound.   Rectal: deferred Msk:  Symmetrical without gross deformities. Normal posture. Extremities:  + 1 edema, mild erythema Neurologic:  Alert and  oriented x4. Skin:  dry flaky skin to BLE with some erythema.  Psych:  Alert and cooperative. Normal mood and affect.  Intake/Output from previous day: 11/07 0701 - 11/08 0700 In: 338.4 [I.V.:338.4] Out: 125 [Urine:125] Intake/Output this shift: No intake/output data recorded.  Wt Readings from Last 3 Encounters:  07/01/23 90.5 kg  06/16/23 94 kg  04/21/23 101.2 kg   Labs/Studies   Recent Labs Recent Labs    07/01/23 1451 07/02/23 0420  WBC 3.8* 3.8*  HGB 11.5* 10.1*  HCT 36.5* 31.8*  PLT 181 154   BMET Recent Labs    07/01/23 1451 07/02/23 0420  NA 139 138  K 3.9 3.8  CL 101 102  CO2 23 26  GLUCOSE 86 87  BUN 11 11  CREATININE 1.34* 1.21  CALCIUM 9.2 8.6*   LFT Recent Labs    07/01/23 1451  PROT 6.3*  ALBUMIN 3.4*  AST 17  ALT 10  ALKPHOS 54  BILITOT 1.0   PT/INR No results for input(s): "LABPROT", "INR" in the last 72 hours. Hepatitis Panel No results for input(s): "HEPBSAG", "HCVAB", "HEPAIGM", "HEPBIGM" in the last 72 hours. C-Diff No results for input(s): "CDIFFTOX" in the  last 72 hours.  Radiology/Studies CT ABDOMEN PELVIS W CONTRAST  Result Date: 07/01/2023 CLINICAL DATA:  Abnormal bowel movements for 1 month, nausea, unintentional weight loss, prior history of lymphoma EXAM: CT ABDOMEN AND PELVIS WITH CONTRAST TECHNIQUE:  Multidetector CT imaging of the abdomen and pelvis was performed using the standard protocol following bolus administration of intravenous contrast. RADIATION DOSE REDUCTION: This exam was performed according to the departmental dose-optimization program which includes automated exposure control, adjustment of the mA and/or kV according to patient size and/or use of iterative reconstruction technique. CONTRAST:  OMNIPAQUE IOHEXOL 300 MG/ML  SOLN COMPARISON:  08/04/2019 FINDINGS: Lower chest: There are small bilateral pleural effusions. New bilateral pleural calcifications could reflect sequela of prior pleurodesis or trauma. No acute airspace disease. Chronic scarring at the right lung base. Hepatobiliary: No focal liver abnormality is seen. No gallstones, gallbladder wall thickening, or biliary dilatation. Pancreas: Unremarkable. No pancreatic ductal dilatation or surrounding inflammatory changes. Spleen: Normal in size without focal abnormality. Adrenals/Urinary Tract: Adrenal glands are unremarkable. Kidneys are normal, without renal calculi, focal lesion, or hydronephrosis. Bladder is unremarkable. Stomach/Bowel: There is irregular wall thickening of the gastric antrum and pylorus, measuring up to 19 mm in maximal thickness. This likely results in an element of gastric outlet obstruction, with distension of the proximal stomach. Differential would include gastric lymphoma versus adenocarcinoma. Endoscopy is recommended for further evaluation. No other signs of bowel obstruction or ileus. Mild fecal retention throughout the colon. Vascular/Lymphatic: Numerous subcentimeter lymph nodes surround the gastric antral wall thickening, measuring up to 9 mm in short axis reference image 37/2. Additionally, there are numerous mesenteric nodules throughout the right upper quadrant which could reflect additional mesenteric adenopathy versus peritoneal carcinomatosis. Largest area measures 2.4 x 2.9 cm reference image  39/2. There is diffuse atherosclerosis of the aorta and its branches. Incidental 1 cm aneurysm extending inferiorly from the main right renal artery, reference image 58/4, not significantly changed. Reproductive: Prostate is unremarkable. Fiduciary markers are seen within the prostate. Other: Trace free fluid within the lower pelvis. No free intraperitoneal gas. No abdominal wall hernia. Musculoskeletal: There are no acute or destructive bony abnormalities. Postsurgical changes are again noted within the midthoracic spine. Reconstructed images demonstrate no additional findings. IMPRESSION: 1. New irregular mural thickening throughout the gastric antrum, concerning for gastric carcinoma versus gastric lymphoma. Endoscopy is recommended for further evaluation. The mural thickening likely results in an element of gastric outlet obstruction, with distension of the proximal stomach. 2. Numerous subcentimeter lymph nodes surrounding the thickened gastric antrum, as well as numerous mesenteric nodules throughout the right upper quadrant, consistent with lymphadenopathy and carcinomatosis. 3. Small bilateral pleural effusions, with new pleural calcifications bilaterally likely sequela of interval pleurodesis or trauma. 4. 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 5.  Aortic Atherosclerosis (ICD10-I70.0). Electronically Signed   By: Sharlet Salina M.D.   On: 07/01/2023 20:12     Assessment   [name]. is a 72 y.o. year old male with history of HTN, aflutter on Eliquis, diastolic heart failure, type 2 diabetes, diffuse large B-cell lymphoma s/p stem cell transplant in 2007, IDA, prostate cancer s/p radiation from January to March who presented to the ED with complaints of early satiety, weight loss, bloating, regurgitation, nausea, and poor p.o. intake. Imaging with irregular gastric wall thickening and lymphadenopathy concerning for gastric carcinoma vs gastric lymphoma. GI  consulted for further evaluation.   CT with gastric wall thickening/concern for GOO, weight loss,  nausea, poor [email or web address]: Patient with significant history of lymphoma as well as prostate cancer.  Has been having ongoing symptoms of nausea, regurgitation, weight loss secondary to poor p.o. intake, and bloating without significant abdominal distention and possible melena intermittently over the last several months.  Per review of chart and patient's report he has lost about 45lbs since January.  He has underwent radiation for recent prostate cancer and is following with urology.  Mild decrease in GFR this admission but he has been given IV fluids, drop in hg to 10.1 likely hemodilution although he has reported some intermittent dark stools over the last few months. Symptoms and CT findings concerning for gastric outlet obstruction possibly secondary to gastric lymphoma vs gastric adenocarcinoma. Will plan to perform EGD today with Dr. [name]. He has had one dose of Eliquis within the last 72 hours.   Plan / Recommendations   NPO EGD today with Dr. [name].  Hold Eliquis Possible clear liquid diet after procedure pending findings Further recommendations to follow     07/02/2023, 9:02 AM Brooke Bonito, MSN, FNP-BC, AGACNP-BC [facility]

## 2023-07-02 NOTE — Anesthesia Preprocedure Evaluation (Signed)
Anesthesia Evaluation    Airway        Dental   Pulmonary former smoker          Cardiovascular hypertension,      Neuro/Psych    GI/Hepatic   Endo/Other  diabetes    Renal/GU      Musculoskeletal   Abdominal   Peds  Hematology   Anesthesia Other Findings   Reproductive/Obstetrics                             Anesthesia Physical Anesthesia Plan  ASA: 3  Anesthesia Plan: General   Post-op Pain Management:    Induction:   PONV Risk Score and Plan: 2  Airway Management Planned: Simple Face Mask  Additional Equipment:   Intra-op Plan:   Post-operative Plan:   Informed Consent: I have reviewed the patients History and Physical, chart, labs and discussed the procedure including the risks, benefits and alternatives for the proposed anesthesia with the patient or authorized representative who has indicated his/her understanding and acceptance.     Dental advisory given  Plan Discussed with: CRNA and Anesthesiologist  Anesthesia Plan Comments:        Anesthesia Quick Evaluation

## 2023-07-02 NOTE — Hospital Course (Signed)
72 y.o. male,  with medical history significant for hypertension, atrial flutter on Eliquis, diastolic heart failure, diabetes mellitus type 2, diffuse large B-cell lymphoma status post stem cell transplant 2007, iron deficiency anemia, recent diagnosis of prostate cancer, status postradiation. -Patient presents to ED secondary to complaints of early satiety, bloating, poor appetite and oral intake, belching, nausea, but no vomiting, as well he does report bloating, he denies fever, chills, he reports significant weight loss due to poor oral intake from his GI symptoms. -In ED workup significant for creatinine at 1.34 which is around her baseline, hemoglobin stable at 11.5, white blood cell count at 3.8, patient had CT abdomen pelvis done which was significant for irregular thickening throughout the gastric antrum concerning for gastric carcinoma versus gastric lymphoma, with an element of gastric outlet obstruction with surrounding lymphadenopathy, possible carcinomatosis.

## 2023-07-02 NOTE — Progress Notes (Signed)
Patient underwent EGD under propofol sedation.  Tolerated the procedure adequately.   FINDINGS:  - Food in the esophagus.  - A large amount of food ( residue) in the stomach precluding visualization on retroflexion  - Gastritis. Biopsied.  - Large circumferential fungating malignant gastric tumor at the pylorus and in the gastric antrum. Able to traversed with adult endoscope . Biopsied.  - Normal duodenal bulb and second portion of the duodenum.  - Feeding tube placement was successfully performed for decompression  RECOMMENDATIONS  -Keep Nasogastric tube to intermittent Low wall suction  -Aspiration precautions  -Follow up biopsy results  -Keep NPO  -Surgical consult for gastric mass with Gastric outlet obstruction  Vista Lawman, MD Gastroenterology and Hepatology [facility]

## 2023-07-02 NOTE — Interval H&P Note (Signed)
History and Physical Interval Note:  07/02/2023 10:32 AM  [name].  has presented today for surgery, with the diagnosis of gastric wall thickening, possible gastric mass, bloating, regurgitation.  The various methods of treatment have been discussed with the patient and family. After consideration of risks, benefits and other options for treatment, the patient has consented to  Procedure(s): ESOPHAGOGASTRODUODENOSCOPY (EGD) WITH PROPOFOL (N/A) as a surgical intervention.  The patient's history has been reviewed, patient examined, no change in status, stable for surgery.  I have reviewed the patient's chart and labs.  Questions were answered to the patient's satisfaction.     [name]

## 2023-07-02 NOTE — Plan of Care (Signed)
  Problem: Education: Goal: Knowledge of General Education information will improve Description Including pain rating scale, medication(s)/side effects and non-pharmacologic comfort measures Outcome: Progressing   Problem: Health Behavior/Discharge Planning: Goal: Ability to manage health-related needs will improve Outcome: Progressing   

## 2023-07-02 NOTE — Progress Notes (Signed)
Initial Nutrition Assessment  DOCUMENTATION CODES:   Non-severe (moderate) malnutrition in context of acute illness/injury  INTERVENTION:   Given moderate malnutrition, recommend start TPN if NPO status expected to exceed 2 more days.   NUTRITION DIAGNOSIS:   Moderate Malnutrition related to acute illness (GOO) as evidenced by mild muscle depletion, mild fat depletion.  GOAL:   Patient will meet greater than or equal to 90% of their needs  MONITOR:   Diet advancement  REASON FOR ASSESSMENT:   Malnutrition Screening Tool    ASSESSMENT:   72 yo male admitted with GOO with concern for gastric malignancy. PMH includes iron deficiency anemia, prostate cancer, B-cell lymphoma, DM-2, HLD, HTN, HF.  S/P EGD this morning. Found to have gastritis and malignant gastric tumor at the pylorus and in the gastric antrum. Tumor was biopsied. NG tube was placed for decompression, currently to LIS. Surgical consult has been ordered. Plans to remain NPO for now.   Spoke with patient at bedside. He reports that he has been able to eat small amounts, but it mostly comes back up for several months. He endorses a lot of weight loss. Weight history reviewed. Patient with 11% weight loss within the past 3 months.   Labs reviewed.  CBG: 101-84  Medications reviewed. IVF: D5 1/2NS with 20 mEq/L KCl at 75 ml/h.   Patient meets criteria for moderate malnutrition, given mild depletion of muscle and subcutaneous fat mass.  NUTRITION - FOCUSED PHYSICAL EXAM:  Flowsheet Row Most Recent Value  Orbital Region No depletion  Upper Arm Region Mild depletion  Thoracic and Lumbar Region No depletion  Buccal Region Mild depletion  Temple Region No depletion  Clavicle Bone Region Mild depletion  Clavicle and Acromion Bone Region Mild depletion  Scapular Bone Region Mild depletion  Dorsal Hand No depletion  Patellar Region No depletion  Anterior Thigh Region No depletion  Posterior Calf Region No  depletion  Edema (RD Assessment) Moderate  Hair Reviewed  Eyes Reviewed  Mouth Reviewed  Skin Reviewed  Nails Reviewed       Diet Order:   Diet Order             Diet NPO time specified  Diet effective now                   EDUCATION NEEDS:   No education needs have been identified at this time  Skin:  Skin Assessment: Reviewed RN Assessment  Last BM:  11/6  Height:   Ht Readings from Last 1 Encounters:  07/01/23 5\' 11"  (1.803 m)    Weight:   Wt Readings from Last 1 Encounters:  07/01/23 90.5 kg    Ideal Body Weight:  78.2 kg  BMI:  Body mass index is 27.83 kg/m.  Estimated Nutritional Needs:   Kcal:  2250-2500  Protein:  120-140 gm  Fluid:  2.3-2.5 L   Gabriel Rainwater RD, LDN, CNSC Please refer to Amion for contact information.

## 2023-07-02 NOTE — Anesthesia Procedure Notes (Signed)
Procedure Name: Intubation Date/Time: 07/02/2023 11:00 AM  Performed by: Shanon Payor, CRNAPre-anesthesia Checklist: Patient identified, Emergency Drugs available, Suction available, Patient being monitored and Timeout performed Patient Re-evaluated:Patient Re-evaluated prior to induction Oxygen Delivery Method: Circle system utilized Preoxygenation: Pre-oxygenation with 100% oxygen Induction Type: IV induction, Rapid sequence and Cricoid Pressure applied Laryngoscope Size: Mac and 3 Grade View: Grade I Tube size: 7.5 mm Number of attempts: 1 Airway Equipment and Method: Stylet Placement Confirmation: ETT inserted through vocal cords under direct vision, positive ETCO2, CO2 detector and breath sounds checked- equal and bilateral Secured at: 23 cm Tube secured with: Tape Dental Injury: Teeth and Oropharynx as per pre-operative assessment

## 2023-07-02 NOTE — Progress Notes (Signed)
PROGRESS NOTE   [name].  ZOX:[id number] DOB: 10/23/1950 DOA: 07/01/2023 PCP: [name], MD   Chief Complaint  Patient presents with   Constipation   Level of care: Med-Surg  Brief Admission History:  72 y.o. male,  with medical history significant for hypertension, atrial flutter on Eliquis, diastolic heart failure, diabetes mellitus type 2, diffuse large B-cell lymphoma status post stem cell transplant 2007, iron deficiency anemia, recent diagnosis of prostate cancer, status postradiation. -Patient presents to ED secondary to complaints of early satiety, bloating, poor appetite and oral intake, belching, nausea, but no vomiting, as well he does report bloating, he denies fever, chills, he reports significant weight loss due to poor oral intake from his GI symptoms. -In ED workup significant for creatinine at 1.34 which is around her baseline, hemoglobin stable at 11.5, white blood cell count at 3.8, patient had CT abdomen pelvis done which was significant for irregular thickening throughout the gastric antrum concerning for gastric carcinoma versus gastric lymphoma, with an element of gastric outlet obstruction with surrounding lymphadenopathy, possible carcinomatosis.   Assessment and Plan:  Gastric output obstruction due to gastric malignancy Nearly satiety/weight loss Failure to thrive -CT abdomen pelvis with concern of gastric outlet obstruction, with possible malignancy and lymphadenopathy -patient will be kept n.p.o. endoscopy by GI with Dr. [name] -Will hold apixaban for now pending surgery consultatoin -Continue with IV fluid -likely will need TPN for nutrition in next couple of days   History of autoimmune hemolytic anemia -Hgb  is stable   Bilateral pleural effusion -Patient is currently on room air, but this may need further workup if malignancy is confirmed if this is related to malignancy.   History of prostate cancer -Patient received radiation therapy  earlier this year, per him and his wife report his PSA has been within normal limit, he is following with urology   History of Diffuse large B-cell lymphoma: Diagnosed in November 2006, treated with R-CHOP chemotherapy with incomplete response.  He is followed by Dr [name].    Atrial flutter on Eliquis, rate controlled - Will hold Eliquis, as well need EGD -Does not appear to be on any AV blocking agents.   DM2, not on insulin -He will be n.p.o., will just monitor CBG and if needed will start on sliding scale   HLD Resume statin when able to take oral    DVT prophylaxis: SCD Code Status: Full  Family Communication: discussed with wife at length on phone 11/8 Disposition: TBD   Consultants:  GI:  Procedures:  EGD 07/02/23 Dr. [name] FINDINGS:  - Food in the esophagus.  - A large amount of food ( residue) in the stomach precluding visualization on retroflexion  - Gastritis. Biopsied.  - Large circumferential fungating malignant gastric tumor at the pylorus and in the gastric antrum. Able to traversed with adult endoscope . Biopsied.  - Normal duodenal bulb and second portion of the duodenum.  - Feeding tube placement was successfully performed for decompression  Antimicrobials:    Subjective: Pt has no appetite.  Denies pain or discomfort.   Objective: Vitals:   07/02/23 1145 07/02/23 1200 07/02/23 1234 07/02/23 1626  BP: 135/67 128/66 (!) 142/77 135/67  Pulse:  80 80 81  Resp: (!) 21 19 16 20   Temp:   (!) 97.5 F (36.4 C)   TempSrc:   Oral   SpO2:  100% 95% 100%  Weight:      Height:        Intake/Output Summary (  Last 24 hours) at 07/02/2023 1748 Last data filed at 07/02/2023 1132 Gross per 24 hour  Intake 738.39 ml  Output 125 ml  Net 613.39 ml   Filed Weights   07/01/23 1427 07/01/23 2216  Weight: 93.9 kg 90.5 kg   Examination:  General exam: Appears calm and comfortable  Respiratory system: Clear to auscultation. Respiratory effort  normal. Cardiovascular system: normal S1 & S2 heard. No JVD, murmurs, rubs, gallops or clicks. No pedal edema. Gastrointestinal system: Abdomen is nondistended, soft and nontender. No organomegaly or masses felt. Normal bowel sounds heard. Central nervous system: Alert and oriented. No focal neurological deficits. Extremities: Symmetric 5 x 5 power. Skin: No rashes, lesions or ulcers. Psychiatry: Judgement and insight appear normal. Mood & affect appropriate.   Data Reviewed: I have personally reviewed following labs and imaging studies  CBC: Recent Labs  Lab 07/01/23 1451 07/02/23 0420  WBC 3.8* 3.8*  NEUTROABS 2.7  --   HGB 11.5* 10.1*  HCT 36.5* 31.8*  MCV 96.1 97.0  PLT 181 154    Basic Metabolic Panel: Recent Labs  Lab 07/01/23 1451 07/02/23 0420  NA 139 138  K 3.9 3.8  CL 101 102  CO2 23 26  GLUCOSE 86 87  BUN 11 11  CREATININE 1.34* 1.21  CALCIUM 9.2 8.6*  PHOS  --  2.6    CBG: Recent Labs  Lab 07/02/23 0004 07/02/23 0407 07/02/23 0725 07/02/23 1000 07/02/23 1620  GLUCAP 77 82 101* 84 90    No results found for this or any previous visit (from the past 240 hour(s)).   Radiology Studies: CT ABDOMEN PELVIS W CONTRAST  Result Date: 07/01/2023 CLINICAL DATA:  Abnormal bowel movements for 1 month, nausea, unintentional weight loss, prior history of lymphoma EXAM: CT ABDOMEN AND PELVIS WITH CONTRAST TECHNIQUE: Multidetector CT imaging of the abdomen and pelvis was performed using the standard protocol following bolus administration of intravenous contrast. RADIATION DOSE REDUCTION: This exam was performed according to the departmental dose-optimization program which includes automated exposure control, adjustment of the mA and/or kV according to patient size and/or use of iterative reconstruction technique. CONTRAST:  OMNIPAQUE IOHEXOL 300 MG/ML  SOLN COMPARISON:  08/04/2019 FINDINGS: Lower chest: There are small bilateral pleural effusions. New bilateral  pleural calcifications could reflect sequela of prior pleurodesis or trauma. No acute airspace disease. Chronic scarring at the right lung base. Hepatobiliary: No focal liver abnormality is seen. No gallstones, gallbladder wall thickening, or biliary dilatation. Pancreas: Unremarkable. No pancreatic ductal dilatation or surrounding inflammatory changes. Spleen: Normal in size without focal abnormality. Adrenals/Urinary Tract: Adrenal glands are unremarkable. Kidneys are normal, without renal calculi, focal lesion, or hydronephrosis. Bladder is unremarkable. Stomach/Bowel: There is irregular wall thickening of the gastric antrum and pylorus, measuring up to 19 mm in maximal thickness. This likely results in an element of gastric outlet obstruction, with distension of the proximal stomach. Differential would include gastric lymphoma versus adenocarcinoma. Endoscopy is recommended for further evaluation. No other signs of bowel obstruction or ileus. Mild fecal retention throughout the colon. Vascular/Lymphatic: Numerous subcentimeter lymph nodes surround the gastric antral wall thickening, measuring up to 9 mm in short axis reference image 37/2. Additionally, there are numerous mesenteric nodules throughout the right upper quadrant which could reflect additional mesenteric adenopathy versus peritoneal carcinomatosis. Largest area measures 2.4 x 2.9 cm reference image 39/2. There is diffuse atherosclerosis of the aorta and its branches. Incidental 1 cm aneurysm extending inferiorly from the main right renal artery, reference image  58/4, not significantly changed. Reproductive: Prostate is unremarkable. Fiduciary markers are seen within the prostate. Other: Trace free fluid within the lower pelvis. No free intraperitoneal gas. No abdominal wall hernia. Musculoskeletal: There are no acute or destructive bony abnormalities. Postsurgical changes are again noted within the midthoracic spine. Reconstructed images demonstrate  no additional findings. IMPRESSION: 1. New irregular mural thickening throughout the gastric antrum, concerning for gastric carcinoma versus gastric lymphoma. Endoscopy is recommended for further evaluation. The mural thickening likely results in an element of gastric outlet obstruction, with distension of the proximal stomach. 2. Numerous subcentimeter lymph nodes surrounding the thickened gastric antrum, as well as numerous mesenteric nodules throughout the right upper quadrant, consistent with lymphadenopathy and carcinomatosis. 3. Small bilateral pleural effusions, with new pleural calcifications bilaterally likely sequela of interval pleurodesis or trauma. 4. 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 5.  Aortic Atherosclerosis (ICD10-I70.0). Electronically Signed   By: Sharlet Salina M.D.   On: 07/01/2023 20:12    Scheduled Meds: Continuous Infusions:  dextrose 5 % and 0.45 % NaCl with KCl 20 mEq/L 75 mL/hr at 07/02/23 1429     LOS: 1 day   Time spent: 56 mins  Ovie Eastep Laural Benes, MD How to contact the [facility] Attending or Consulting provider 7A - 7P or covering provider during after hours 7P -7A, for this patient?  Check the care team in [facility] and look for a) attending/consulting [facility] provider listed and b) the [facility] team listed Log into [email or web address] to find provider on call.  Locate the [facility] provider you are looking for under [facility] and page to a number that you can be directly reached. If you still have difficulty reaching the provider, please page the [facility] (Director on Call) for the Hospitalists listed on amion for assistance.  07/02/2023, 5:48 PM

## 2023-07-02 NOTE — Consult Note (Signed)
Gastroenterology Consult   Referring Provider: No ref. provider found Primary Care Physician:  [name], MD Primary Gastroenterologist:  previously Dr. Jena Gauss in 2017  Patient ID: [name].; [id number]; [birth year]   Admit date: 07/01/2023  LOS: 1 day   Date of Consultation: 07/02/2023  Reason for Consultation: gastric wall thickening, concern for gastric mass, nausea/vomiting weight loss.   History of Present Illness   [name]. is a 72 y.o. year old male with history of HTN, aflutter on Eliquis, diastolic heart failure, type 2 diabetes, diffuse large B-cell lymphoma s/p stem cell transplant in 2007, IDA, prostate cancer s/p radiation from January to March who presented to the ED with complaints of early satiety, weight loss, bloating, regurgitation, nausea, and poor p.o. intake. Imaging with irregular gastric wall thickening and lymphadenopathy concerning for gastric carcinoma vs gastric lymphoma. GI consulted for further evaluation.   ED course: Labs -WBC 3.8, hemoglobin 11.5, creatinine 1.34 (close to baseline).  Normal lipase.  UA with rare bacteria and presence of proteinuria and ketonuria.  Given IV fluids CT A/P with contrast: New irregular mural thickening throughout the gastric and symptoms concerning for gastric carcinoma versus gastric lymphoma, endoscopy recommended for further evaluation (mural thickening could be causing possible gastric outlet obstruction given proximal stomach distention), numerous subcentimeter lymph nodes surrounding thickened gastric antrum and numerous mesenteric nodules throughout RUQ consistent with lymphadenopathy and carcinomatosis.  Also with bilateral small pleural effusions   Consult: For the last 2 days he has not had much intake given bloating and dry heaving. Due to this he has also not taken many of his daily medications. Has been having stomach issues since after all his prostate procedures. He received radiation from  Jan-March. He may have had some of these symptoms around April/May but thought it was reflux. Had not been on any PPI. Denies reflux symptoms currently. Tried to cut back on some greasy foods etc. He states he intermittently has had regurgitation of foods, it it happened a couple times while driving. Has been nauseas at times and has had some troubles with bowel movements. Stools began to get dark a few times within the last couple months, possibly black. He stays away from NSAIDs per patient. Denies tobacco use and alcohol use. Denies overt abdominal pain. Has had boating and discomfort. Reports lots of belching and then that leads to the regurgitation. Denies esophageal dysphagia. He repots about 2 months ago he was about 240 and then appointment before last he was 220 and then states he was 208lbs and he states since hospital admission they told him he was at 199 lbs.   Wt Readings from Last 3 Encounters:  07/01/23 90.5 kg  06/16/23 94 kg  04/21/23 101.2 kg  07/01/23         244lbs (110.7kg)   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   autologous stem cell transplant 12/2005   Obesity, morbid (more than 100 lbs over ideal weight or BMI > 40) (HCC)    Venous insufficiency 04/29/2011    Past Surgical History:  Procedure Laterality Date   BACK SURGERY  2006   T6 vertebrae removed/titanium placed   CATARACT EXTRACTION W/PHACO Right 03/31/2019   Procedure: CATARACT EXTRACTION PHACO AND INTRAOCULAR LENS PLACEMENT (IOC);  Surgeon: Fabio Pierce, MD;  Location: [facility];  Service: Ophthalmology;  Laterality: Right;  CDE: 3.21   CATARACT EXTRACTION W/PHACO Left 04/14/2019   Procedure: CATARACT  EXTRACTION PHACO AND INTRAOCULAR LENS PLACEMENT (IOC);  Surgeon: Fabio Pierce, MD;  Location: [facility];  Service: Ophthalmology;  Laterality: Left;  left - pt knows to arrive at 7:45, CDE: 3.55   COLONOSCOPY  11/24/2004   Polyps in the left colon ablated/removed as described above.  Two submucosal lesions consistent with lipomas as described above not  manipulated./ Normal rectum   COLONOSCOPY  06/20/2012   MULTIPLE RECTAL AND COLONIC POLYPS   COLONOSCOPY N/A 03/08/2015   RMR: Capacious, redundant colon. Multiple colonic and rectosigmoid polyps removed. ablated as described above. colonic lipoma abnormal appearing terminal ileum likely a variant of normal). Howeverwith history  biopsies obtained.    ESOPHAGOGASTRODUODENOSCOPY N/A 03/08/2015   RMR: Hiatal hernia Polypoid gastric mucosa with multiple gastric polyps. largest polyp removed via snare polypectomgy hemostasis clip placed at base. Status post gastric biopsy. Status post video capsule placement.    GIVENS CAPSULE STUDY N/A 03/08/2015   Procedure: GIVENS CAPSULE STUDY;  Surgeon: Corbin Ade, MD;  Location: [facility];  Service: Endoscopy;  Laterality: N/A;   GOLD SEED IMPLANT N/A 09/08/2022   Procedure: GOLD SEED IMPLANT;  Surgeon: Malen Gauze, MD;  Location: [facility];  Service: Urology;  Laterality: N/A;   LIMBAL STEM CELL TRANSPLANT     LUNG BIOPSY  6/06   MULTIPLE TOOTH EXTRACTIONS  04/2005   port a cath placement     PORT-A-CATH REMOVAL  09/08/2012   Procedure: REMOVAL PORT-A-CATH;  Surgeon: Loreli Slot, MD;  Location: [facility] OR;  Service: Thoracic;  Laterality: N/A;   SPACE OAR INSTILLATION N/A 09/08/2022   Procedure: SPACE OAR INSTILLATION;  Surgeon: Malen Gauze, MD;  Location: [facility];  Service: Urology;  Laterality: N/A;    Prior to Admission medications   Medication Sig Start Date End Date Taking? Authorizing Provider  apixaban (ELIQUIS) 5 MG TABS tablet Take 1 tablet (5 mg total) by mouth 2 (two) times  daily. 01/24/18  Yes Laqueta Linden, MD  FEROSUL 325 (65 Fe) MG tablet Take 325 mg by mouth 2 (two) times daily. 04/27/23  Yes [provider]  folic acid (FOLVITE) 1 MG tablet TAKE 1 TABLET(1 MG) BY MOUTH DAILY 04/26/23  Yes Macintyre, Renda Rolls, NP  furosemide (LASIX) 20 MG tablet Take 1 tablet (20 mg total) by mouth daily as needed for fluid or edema. 07/30/22  Yes Strader, [place] M, PA-C  potassium chloride (KLOR-CON) 10 MEQ tablet Take 10 meq on days you need to take Lasix Patient taking differently: 10 mEq. Take 10 meq on days you need to take Lasix 08/07/22  Yes Strader, [place] M, PA-C  rosuvastatin (CRESTOR) 40 MG tablet Take 40 mg by mouth daily. 05/04/19  Yes [provider]  vitamin B-12 (CYANOCOBALAMIN) 1000 MCG tablet Take 2 tablets (2,000 mcg total) by mouth daily. 06/05/21  Yes Doreatha Massed, MD  Vitamin D, Ergocalciferol, (DRISDOL) 1.25 MG (50000 UNIT) CAPS capsule TAKE 1 CAPSULE BY MOUTH ONCE WEEKLY 08/18/22  Yes Doreatha Massed, MD  Blood Glucose Monitoring Suppl (ONE TOUCH ULTRA 2) w/Device KIT USE TO TEST BLOOD SUGAR ONCE D UTD 05/25/19   [provider]  Lancets [facility] DELICA PLUS  LANCET33G) MISC USE TO TEST TWICE DAILY 11/24/22   Dani Gobble, NP  [facility] ULTRA test strip TEST TWICE DAILY 11/22/20   Roma Kayser, MD    Current Facility-Administered Medications  Medication Dose Route Frequency Provider Last Rate Last Admin   acetaminophen (TYLENOL) tablet 650 mg  650 mg Oral Q6H PRN Elgergawy, Leana Roe, MD       Or   acetaminophen (TYLENOL) suppository 650 mg  650 mg Rectal Q6H PRN Elgergawy, Leana Roe, MD       albuterol (PROVENTIL) (2.5 MG/3ML) 0.083% nebulizer solution 2.5 mg  2.5 mg Nebulization Q2H PRN Elgergawy, Leana Roe, MD       dextrose 5 % and 0.45 % NaCl with KCl 20 mEq/L infusion   Intravenous Continuous Elgergawy, Leana Roe, MD 75 mL/hr at 07/02/23 0311 Infusion Verify at 07/02/23 0311   hydrALAZINE (APRESOLINE)  injection 5 mg  5 mg Intravenous Q4H PRN Elgergawy, Leana Roe, MD        Allergies as of 07/01/2023 - Review Complete 07/01/2023  Allergen Reaction Noted   Penicillins Rash 04/29/2011    Family History  Problem Relation Age of Onset   Cancer Mother        lung   Hypertension Mother    Hyperlipidemia Mother    Cancer Father        prostate   Hypertension Father    Colon cancer Neg Hx    Diabetes Neg Hx     Social History   Socioeconomic History   Marital status: Married    Spouse name: Not on file   Number of children: 2   Years of education: Not on file   Highest education level: Not on file  Occupational History   Occupation: disability due to back    Employer: UNEMPLOYED  Tobacco Use   Smoking status: Former    Current packs/day: 0.00    Average packs/day: 0.5 packs/day for 15.0 years (7.5 ttl pk-yrs)    Types: Cigarettes    Start date: 11/11/1987    Quit date: 11/11/2002    Years since quitting: 20.6   Smokeless tobacco: Never  Vaping Use   Vaping status: Never Used  Substance and Sexual Activity   Alcohol use: No    Alcohol/week: 0.0 standard drinks of alcohol   Drug use: No   Sexual activity: Yes    Birth control/protection: None  Other Topics Concern   Not on file  Social History Narrative   Married   No regular exercise   Social Determinants of Health   Financial Resource Strain: Low Risk  (06/28/2020)   Overall Financial Resource Strain (CARDIA)    Difficulty of Paying Living Expenses: Not hard at all  Food Insecurity: No Food Insecurity (07/01/2023)   Hunger Vital Sign    Worried About Running Out of Food in the Last Year: Never true    Ran Out of Food in the Last Year: Never true  Transportation Needs: No Transportation Needs (07/01/2023)   PRAPARE - Administrator, Civil Service (Medical): No    Lack of Transportation (Non-Medical): No  Physical Activity: Inactive (06/28/2020)   Exercise Vital Sign    Days of Exercise per Week: 0  days    Minutes of Exercise per Session: 0 min  Stress: No Stress Concern Present (06/28/2020)   [employer] of Occupational Health - Occupational Stress Questionnaire    Feeling of Stress : Not at all  Social Connections: Moderately Integrated (06/28/2020)  Social Advertising account executive [NHANES]    Frequency of Communication with Friends and Family: More than three times a week    Frequency of Social Gatherings with Friends and Family: Twice a week    Attends Religious Services: More than 4 times per year    Active Member of [employer] or Organizations: No    Attends Banker Meetings: Never    Marital Status: Married  Catering manager Violence: Not At Risk (07/01/2023)   Humiliation, Afraid, Rape, and Kick questionnaire    Fear of Current or Ex-Partner: No    Emotionally Abused: No    Physically Abused: No    Sexually Abused: No     Review of Systems   Gen: + weight loss, fatigues, weakness, lack of appetite.  Denies any fever, chills. CV: Denies chest pain, heart palpitations, syncope, edema  Resp: + shortness of breath. Denies cough, wheezing, coughing up blood, and pleurisy. GI: see HPI GU : + nocturia. Denies urinary burning, blood in urine, and urinary incontinence. MS: Denies joint pain, limitation of movement, swelling, cramps, and atrophy.  Derm: Denies rash, itching, dry skin, hives. Psych: Denies depression, anxiety, memory loss, hallucinations, and confusion. Heme: Denies bruising or bleeding Neuro:  Denies any headaches, dizziness, paresthesias, shaking  Physical Exam   Vital Signs in last 24 hours: Temp:  [97.6 F (36.4 C)-98.9 F (37.2 C)] 98.3 F (36.8 C) (11/08 0617) Pulse Rate:  [42-97] 83 (11/08 0617) Resp:  [16-20] 18 (11/08 0617) BP: (136-168)/(63-128) 142/64 (11/08 0617) SpO2:  [92 %-100 %] 100 % (11/08 0617) Weight:  [90.5 kg-93.9 kg] 90.5 kg (11/07 2216) Last BM Date : 06/30/23  General:   Alert,  pleasant and cooperative in  NAD Head:  Normocephalic and atraumatic. Eyes:  Sclera clear, no icterus.   Conjunctiva pink. Ears:  Normal auditory acuity. Neck:  Supple; no masses Lungs:  Clear throughout to auscultation.   No wheezes, crackles, or rhonchi. No acute distress. Heart:  Regular rate and rhythm; no murmurs, clicks, rubs,  or gallops. Abdomen:  Soft, nontender and nondistended. No masses, hepatosplenomegaly or hernias noted. Normal bowel sounds, without guarding, and without rebound.   Rectal: deferred Msk:  Symmetrical without gross deformities. Normal posture. Extremities:  + 1 edema, mild erythema Neurologic:  Alert and  oriented x4. Skin:  dry flaky skin to BLE with some erythema.  Psych:  Alert and cooperative. Normal mood and affect.  Intake/Output from previous day: 11/07 0701 - 11/08 0700 In: 338.4 [I.V.:338.4] Out: 125 [Urine:125] Intake/Output this shift: No intake/output data recorded.  Wt Readings from Last 3 Encounters:  07/01/23 90.5 kg  06/16/23 94 kg  04/21/23 101.2 kg   Labs/Studies   Recent Labs Recent Labs    07/01/23 1451 07/02/23 0420  WBC 3.8* 3.8*  HGB 11.5* 10.1*  HCT 36.5* 31.8*  PLT 181 154   BMET Recent Labs    07/01/23 1451 07/02/23 0420  NA 139 138  K 3.9 3.8  CL 101 102  CO2 23 26  GLUCOSE 86 87  BUN 11 11  CREATININE 1.34* 1.21  CALCIUM 9.2 8.6*   LFT Recent Labs    07/01/23 1451  PROT 6.3*  ALBUMIN 3.4*  AST 17  ALT 10  ALKPHOS 54  BILITOT 1.0   PT/INR No results for input(s): "LABPROT", "INR" in the last 72 hours. Hepatitis Panel No results for input(s): "HEPBSAG", "HCVAB", "HEPAIGM", "HEPBIGM" in the last 72 hours. C-Diff No results for input(s): "CDIFFTOX" in the  last 72 hours.  Radiology/Studies CT ABDOMEN PELVIS W CONTRAST  Result Date: 07/01/2023 CLINICAL DATA:  Abnormal bowel movements for 1 month, nausea, unintentional weight loss, prior history of lymphoma EXAM: CT ABDOMEN AND PELVIS WITH CONTRAST TECHNIQUE:  Multidetector CT imaging of the abdomen and pelvis was performed using the standard protocol following bolus administration of intravenous contrast. RADIATION DOSE REDUCTION: This exam was performed according to the departmental dose-optimization program which includes automated exposure control, adjustment of the mA and/or kV according to patient size and/or use of iterative reconstruction technique. CONTRAST:  OMNIPAQUE IOHEXOL 300 MG/ML  SOLN COMPARISON:  08/04/2019 FINDINGS: Lower chest: There are small bilateral pleural effusions. New bilateral pleural calcifications could reflect sequela of prior pleurodesis or trauma. No acute airspace disease. Chronic scarring at the right lung base. Hepatobiliary: No focal liver abnormality is seen. No gallstones, gallbladder wall thickening, or biliary dilatation. Pancreas: Unremarkable. No pancreatic ductal dilatation or surrounding inflammatory changes. Spleen: Normal in size without focal abnormality. Adrenals/Urinary Tract: Adrenal glands are unremarkable. Kidneys are normal, without renal calculi, focal lesion, or hydronephrosis. Bladder is unremarkable. Stomach/Bowel: There is irregular wall thickening of the gastric antrum and pylorus, measuring up to 19 mm in maximal thickness. This likely results in an element of gastric outlet obstruction, with distension of the proximal stomach. Differential would include gastric lymphoma versus adenocarcinoma. Endoscopy is recommended for further evaluation. No other signs of bowel obstruction or ileus. Mild fecal retention throughout the colon. Vascular/Lymphatic: Numerous subcentimeter lymph nodes surround the gastric antral wall thickening, measuring up to 9 mm in short axis reference image 37/2. Additionally, there are numerous mesenteric nodules throughout the right upper quadrant which could reflect additional mesenteric adenopathy versus peritoneal carcinomatosis. Largest area measures 2.4 x 2.9 cm reference image  39/2. There is diffuse atherosclerosis of the aorta and its branches. Incidental 1 cm aneurysm extending inferiorly from the main right renal artery, reference image 58/4, not significantly changed. Reproductive: Prostate is unremarkable. Fiduciary markers are seen within the prostate. Other: Trace free fluid within the lower pelvis. No free intraperitoneal gas. No abdominal wall hernia. Musculoskeletal: There are no acute or destructive bony abnormalities. Postsurgical changes are again noted within the midthoracic spine. Reconstructed images demonstrate no additional findings. IMPRESSION: 1. New irregular mural thickening throughout the gastric antrum, concerning for gastric carcinoma versus gastric lymphoma. Endoscopy is recommended for further evaluation. The mural thickening likely results in an element of gastric outlet obstruction, with distension of the proximal stomach. 2. Numerous subcentimeter lymph nodes surrounding the thickened gastric antrum, as well as numerous mesenteric nodules throughout the right upper quadrant, consistent with lymphadenopathy and carcinomatosis. 3. Small bilateral pleural effusions, with new pleural calcifications bilaterally likely sequela of interval pleurodesis or trauma. 4. 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 5.  Aortic Atherosclerosis (ICD10-I70.0). Electronically Signed   By: Sharlet Salina M.D.   On: 07/01/2023 20:12     Assessment   [name]. is a 72 y.o. year old male with history of HTN, aflutter on Eliquis, diastolic heart failure, type 2 diabetes, diffuse large B-cell lymphoma s/p stem cell transplant in 2007, IDA, prostate cancer s/p radiation from January to March who presented to the ED with complaints of early satiety, weight loss, bloating, regurgitation, nausea, and poor p.o. intake. Imaging with irregular gastric wall thickening and lymphadenopathy concerning for gastric carcinoma vs gastric lymphoma. GI  consulted for further evaluation.   CT with gastric wall thickening/concern for GOO, weight loss,  nausea, poor [email or web address]: Patient with significant history of lymphoma as well as prostate cancer.  Has been having ongoing symptoms of nausea, regurgitation, weight loss secondary to poor p.o. intake, and bloating without significant abdominal distention and possible melena intermittently over the last several months.  Per review of chart and patient's report he has lost about 45lbs since January.  He has underwent radiation for recent prostate cancer and is following with urology.  Mild decrease in GFR this admission but he has been given IV fluids, drop in hg to 10.1 likely hemodilution although he has reported some intermittent dark stools over the last few months. Symptoms and CT findings concerning for gastric outlet obstruction possibly secondary to gastric lymphoma vs gastric adenocarcinoma. Will plan to perform EGD today with Dr. [name]. He has had one dose of Eliquis within the last 72 hours.   Plan / Recommendations   NPO EGD today with Dr. [name].  Hold Eliquis Possible clear liquid diet after procedure pending findings Further recommendations to follow     07/02/2023, 9:02 AM Brooke Bonito, MSN, FNP-BC, AGACNP-BC [facility]

## 2023-07-03 DIAGNOSIS — I700 Atherosclerosis of aorta: Secondary | ICD-10-CM

## 2023-07-03 DIAGNOSIS — K3189 Other diseases of stomach and duodenum: Secondary | ICD-10-CM | POA: Diagnosis not present

## 2023-07-03 DIAGNOSIS — K311 Adult hypertrophic pyloric stenosis: Secondary | ICD-10-CM | POA: Diagnosis not present

## 2023-07-03 LAB — COMPREHENSIVE METABOLIC PANEL
ALT: 10 U/L (ref 0–44)
AST: 13 U/L — ABNORMAL LOW (ref 15–41)
Albumin: 2.7 g/dL — ABNORMAL LOW (ref 3.5–5.0)
Alkaline Phosphatase: 45 U/L (ref 38–126)
Anion gap: 7 (ref 5–15)
BUN: 7 mg/dL — ABNORMAL LOW (ref 8–23)
CO2: 26 mmol/L (ref 22–32)
Calcium: 8.4 mg/dL — ABNORMAL LOW (ref 8.9–10.3)
Chloride: 104 mmol/L (ref 98–111)
Creatinine, Ser: 1.22 mg/dL (ref 0.61–1.24)
GFR, Estimated: >60 mL/min (ref >60)
Glucose, Bld: 91 mg/dL (ref 70–99)
Potassium: 3.8 mmol/L (ref 3.5–5.1)
Sodium: 137 mmol/L (ref 135–145)
Total Bilirubin: 0.7 mg/dL (ref <1.2)
Total Protein: 5 g/dL — ABNORMAL LOW (ref 6.5–8.1)

## 2023-07-03 LAB — GLUCOSE, CAPILLARY
Glucose-Capillary: 95 mg/dL (ref 70–99)
Glucose-Capillary: 105 mg/dL — ABNORMAL HIGH (ref 70–99)
Glucose-Capillary: 79 mg/dL (ref 70–99)
Glucose-Capillary: 97 mg/dL (ref 70–99)
Glucose-Capillary: 91 mg/dL (ref 70–99)
Glucose-Capillary: 94 mg/dL (ref 70–99)

## 2023-07-03 LAB — CBC
HCT: 31.6 % — ABNORMAL LOW (ref 39.0–52.0)
Hemoglobin: 9.7 g/dL — ABNORMAL LOW (ref 13.0–17.0)
MCH: 29.8 pg (ref 26.0–34.0)
MCHC: 30.7 g/dL (ref 30.0–36.0)
MCV: 97.2 fL (ref 80.0–100.0)
Platelets: 153 10*3/uL (ref 150–400)
RBC: 3.25 MIL/uL — ABNORMAL LOW (ref 4.22–5.81)
RDW: 16.2 % — ABNORMAL HIGH (ref 11.5–15.5)
WBC: 4.6 10*3/uL (ref 4.0–10.5)
nRBC: 0 % (ref 0.0–0.2)

## 2023-07-03 LAB — PHOSPHORUS: Phosphorus: 2.6 mg/dL (ref 2.5–4.6)

## 2023-07-03 LAB — MAGNESIUM: Magnesium: 1.9 mg/dL (ref 1.7–2.4)

## 2023-07-03 MED ORDER — METOPROLOL TARTRATE 5 MG/5ML IV SOLN
2.5000 mg | Freq: Four times a day (QID) | INTRAVENOUS | Status: DC
  Administered 2023-07-03 – 2023-07-12 (×35): 2.5 mg via INTRAVENOUS
  Filled 2023-07-03 (×35): qty 5

## 2023-07-03 MED ORDER — BOOST / RESOURCE BREEZE PO LIQD CUSTOM
1.0000 | Freq: Three times a day (TID) | ORAL | Status: DC
  Administered 2023-07-03 – 2023-07-05 (×5): 1 via ORAL

## 2023-07-03 NOTE — Progress Notes (Signed)
Patient running sinus tach 150s per telemetry monitor while getting up to urinate.EKG obtained patient running AFIB RVR with PVCs. Dr. Carren Rang notified no new orders.

## 2023-07-03 NOTE — Progress Notes (Signed)
Katrinka Blazing, M.D. Gastroenterology & Hepatology   Interval History:  Patient reports feeling well.  Denies any nausea, vomiting, fever or chills.  No abdominal pain. Nursing staff reach me at 4 AM stating that the patient had accidentally pulled his NG tube out.  He only had 20 cc drainage during the night.  Inpatient Medications:  Current Facility-Administered Medications:    acetaminophen (TYLENOL) tablet 650 mg, 650 mg, Oral, Q6H PRN **OR** acetaminophen (TYLENOL) suppository 650 mg, 650 mg, Rectal, Q6H PRN, Elgergawy, Dawood S, MD   albuterol (PROVENTIL) (2.5 MG/3ML) 0.083% nebulizer solution 2.5 mg, 2.5 mg, Nebulization, Q2H PRN, Elgergawy, Leana Roe, MD   hydrALAZINE (APRESOLINE) injection 5 mg, 5 mg, Intravenous, Q4H PRN, Elgergawy, Leana Roe, MD   metoprolol tartrate (LOPRESSOR) injection 2.5 mg, 2.5 mg, Intravenous, Q6H, Johnson, Clanford L, MD, 2.5 mg at 07/03/23 0833   I/O    Intake/Output Summary (Last 24 hours) at 07/03/2023 1002 Last data filed at 07/03/2023 0015 Gross per 24 hour  Intake 400 ml  Output 170 ml  Net 230 ml     Physical Exam: Temp:  [97.5 F (36.4 C)-98.5 F (36.9 C)] 98.5 F (36.9 C) (11/09 0831) Pulse Rate:  [50-97] 95 (11/09 0831) Resp:  [16-21] 17 (11/09 0528) BP: (119-144)/(57-77) 128/69 (11/09 0831) SpO2:  [95 %-100 %] 99 % (11/09 0831)  Temp (24hrs), Avg:98.1 F (36.7 C), Min:97.5 F (36.4 C), Max:98.5 F (36.9 C)  GENERAL: The patient is AO x3, in no acute distress. HEENT: Head is normocephalic and atraumatic. EOMI are intact. Mouth is well hydrated and without lesions. NECK: Supple. No masses LUNGS: Clear to auscultation. No presence of rhonchi/wheezing/rales. Adequate chest expansion HEART: RRR, normal s1 and s2. ABDOMEN: Soft, nontender, no guarding, no peritoneal signs, and nondistended. BS +. No masses. EXTREMITIES: Without any cyanosis, clubbing, rash, lesions or edema. NEUROLOGIC: AOx3, no focal motor deficit. SKIN: no  jaundice, no rashes  Laboratory Data: CBC:     Component Value Date/Time   WBC 4.6 07/03/2023 0456   RBC 3.25 (L) 07/03/2023 0456   HGB 9.7 (L) 07/03/2023 0456   HCT 31.6 (L) 07/03/2023 0456   PLT 153 07/03/2023 0456   MCV 97.2 07/03/2023 0456   MCH 29.8 07/03/2023 0456   MCHC 30.7 07/03/2023 0456   RDW 16.2 (H) 07/03/2023 0456   LYMPHSABS 0.4 (L) 07/01/2023 1451   MONOABS 0.5 07/01/2023 1451   EOSABS 0.2 07/01/2023 1451   BASOSABS 0.0 07/01/2023 1451   COAG:  Lab Results  Component Value Date   INR 1.24 05/18/2017    BMP:     Latest Ref Rng & Units 07/03/2023    4:56 AM 07/02/2023    4:20 AM 07/01/2023    2:51 PM  BMP  Glucose 70 - 99 mg/dL 91  87  86   BUN 8 - 23 mg/dL 7  11  11    Creatinine 0.61 - 1.24 mg/dL 1.61  0.96  0.45   Sodium 135 - 145 mmol/L 137  138  139   Potassium 3.5 - 5.1 mmol/L 3.8  3.8  3.9   Chloride 98 - 111 mmol/L 104  102  101   CO2 22 - 32 mmol/L 26  26  23    Calcium 8.9 - 10.3 mg/dL 8.4  8.6  9.2     HEPATIC:     Latest Ref Rng & Units 07/03/2023    4:56 AM 07/01/2023    2:51 PM 07/30/2021    9:23 AM  Hepatic  Function  Total Protein 6.5 - 8.1 g/dL 5.0  6.3  6.3   Albumin 3.5 - 5.0 g/dL 2.7  3.4  3.9   AST 15 - 41 U/L 13  17  23    ALT 0 - 44 U/L 10  10  24    Alk Phosphatase 38 - 126 U/L 45  54  71   Total Bilirubin <1.2 mg/dL 0.7  1.0  0.4     CARDIAC:  Lab Results  Component Value Date   CKTOTAL 69 08/02/2019   TROPONINI <0.03 05/19/2017      Imaging: I personally reviewed and interpreted the available labs, imaging and endoscopic files.   Assessment/Plan: Briefly, this is a 72 year old male with a history of A-fib on Eliquis, diastolic heart failure, hypertension, diffuse large B-cell lymphoma status post stem cell transplant 2007, prostate cancer status post radiation, who came to the hospital after presenting worsening satiety, nausea, vomiting and weight loss.  Patient has presented worsening of his symptoms for the last 2 days  and he was not able to have adequate oral intake.  Underwent a CT of the abdomen pelvis with IV contrast that showed diffuse mural thickening throughout the antrum with possible adjacent gastric L obstruction which was concerning for gastric cancer versus lymphoma.  There was presence of numerous subcentimeter lymph nodes surrounding the gastric antrum, as well as mesenteric nodules throughout the right upper quadrant suggestive of lymphadenopathy and carcinomatosis.  Due to this, the patient underwent an EGD yesterday which showed food in the esophagus and stomach, for which an NG tube was placed.  There was noted Arity throughout the stomach which was biopsied.  There was a large fungating ulcerated mass in the gastric antrum and pylorus causing partial outlet obstruction which was biopsied.  Normal duodenum.  Pathology is pending.  I discussed with the patient the findings during the most recent endoscopy and the fact that the mass is concerning for a malignancy.  It is unclear if this is a primary gastric cancer or lymphoma, but it appears that he has concern for lymph node and peritoneal involvement based on imaging.  Regarding the next steps on his treatment, we will need to wait until we get the results of the pathology to determine if he will be a candidate for oncologic definitive treatment.  Unfortunately, the gastric mass is causing a partial gastric outlet obstruction and is limiting his food intake.  General surgery was consulted for evaluation -discussed the case with Dr. Robyne Peers -will wait for final biopsy results to determine the type of intervention the patient will benefit most from.  We both agreed that as the patient had some patency through his pylorus, he can be on clear liquids for now.  Once pathology is back, may need to reach oncology to have a close follow-up as outpatient.  -Follow pathology result -Clear liquid diet -General Surgery recommendations appreciated -Will need  oncology follow-up once pathology is back -Consider CT of the chest with IV contrast for staging  Katrinka Blazing, MD Gastroenterology and Hepatology [facility] Gastroenterology

## 2023-07-03 NOTE — Progress Notes (Signed)
Patient presenting with no nausea or vomiting, after he pulled his NG out. Only 20mL total output while NG was in. Spoke with Dr. Boykin Peek on call GI, to determine if tube is to be left out or placed back, per Dr. Levon Hedger leave tube out, will pass along information to oncoming nurse.

## 2023-07-03 NOTE — Progress Notes (Signed)
PROGRESS NOTE   [name].  AVW:[id number] DOB: [birth year] DOA: 07/01/2023 PCP: [name], MD   Chief Complaint  Patient presents with   Constipation   Level of care: Med-Surg  Brief Admission History:  72 y.o. male,  with medical history significant for hypertension, atrial flutter on Eliquis, diastolic heart failure, diabetes mellitus type 2, diffuse large B-cell lymphoma status post stem cell transplant 2007, iron deficiency anemia, recent diagnosis of prostate cancer, status postradiation. -Patient presents to ED secondary to complaints of early satiety, bloating, poor appetite and oral intake, belching, nausea, but no vomiting, as well he does report bloating, he denies fever, chills, he reports significant weight loss due to poor oral intake from his GI symptoms. -In ED workup significant for creatinine at 1.34 which is around her baseline, hemoglobin stable at 11.5, white blood cell count at 3.8, patient had CT abdomen pelvis done which was significant for irregular thickening throughout the gastric antrum concerning for gastric carcinoma versus gastric lymphoma, with an element of gastric outlet obstruction with surrounding lymphadenopathy, possible carcinomatosis.   Assessment and Plan:  Gastric output obstruction due to gastric malignancy Nearly satiety/weight loss Failure to thrive -CT abdomen pelvis with concern of gastric outlet obstruction, with possible malignancy and lymphadenopathy -patient will be kept n.p.o. endoscopy by GI with Dr. [name] -Will hold apixaban for now pending surgery recommendations -Continue with clear liquids for now    History of autoimmune hemolytic anemia -Hgb  is stable   Bilateral pleural effusion -Patient is currently on room air, but this may need further workup if malignancy is confirmed if this is related to malignancy.   History of prostate cancer -Patient received radiation therapy earlier this year, per him and his wife  report his PSA has been within normal limit, he is following with urology   History of Diffuse large B-cell lymphoma: Diagnosed in November 2006, treated with R-CHOP chemotherapy with incomplete response.  He is followed by Dr [name].    Atrial flutter on Eliquis, rate controlled - Will hold Eliquis, as well need EGD -Does not appear to be on any AV blocking agents.   DM2, not on insulin -He will be n.p.o., will just monitor CBG and if needed will start on sliding scale   HLD Resume statin when able to take oral  DVT prophylaxis: SCD Code Status: Full  Family Communication: discussed with wife at length on phone 11/8 Disposition: TBD   Consultants:  GI:  Procedures:  EGD 07/02/23 Dr. [name] FINDINGS:  - Food in the esophagus.  - A large amount of food ( residue) in the stomach precluding visualization on retroflexion  - Gastritis. Biopsied.  - Large circumferential fungating malignant gastric tumor at the pylorus and in the gastric antrum. Able to traversed with adult endoscope . Biopsied.  - Normal duodenal bulb and second portion of the duodenum.  - Feeding tube placement was successfully performed for decompression  Antimicrobials:    Subjective: Pt reports that he is not vomiting.    Objective: Vitals:   07/03/23 0528 07/03/23 0831 07/03/23 1217 07/03/23 1415  BP: (!) 119/57 128/69 127/73 116/67  Pulse: 97 95 82 81  Resp: 17     Temp: 98.5 F (36.9 C) 98.5 F (36.9 C)  99 F (37.2 C)  TempSrc: Oral Oral  Oral  SpO2: 97% 99% 100% 98%  Weight:      Height:        Intake/Output Summary (Last 24 hours) at 07/03/2023 1617 Last  data filed at 07/03/2023 0015 Gross per 24 hour  Intake --  Output 170 ml  Net -170 ml   Filed Weights   07/01/23 1427 07/01/23 2216  Weight: 93.9 kg 90.5 kg   Examination:  General exam: Appears calm and comfortable  Respiratory system: Clear to auscultation. Respiratory effort normal. Cardiovascular system: normal S1 & S2  heard. No JVD, murmurs, rubs, gallops or clicks. No pedal edema. Gastrointestinal system: Abdomen is nondistended, soft and nontender. No organomegaly or masses felt. Normal bowel sounds heard. Central nervous system: Alert and oriented. No focal neurological deficits. Extremities: Symmetric 5 x 5 power. Skin: No rashes, lesions or ulcers. Psychiatry: Judgement and insight appear normal. Mood & affect appropriate.   Data Reviewed: I have personally reviewed following labs and imaging studies  CBC: Recent Labs  Lab 07/01/23 1451 07/02/23 0420 07/03/23 0456  WBC 3.8* 3.8* 4.6  NEUTROABS 2.7  --   --   HGB 11.5* 10.1* 9.7*  HCT 36.5* 31.8* 31.6*  MCV 96.1 97.0 97.2  PLT 181 154 153    Basic Metabolic Panel: Recent Labs  Lab 07/01/23 1451 07/02/23 0420 07/03/23 0456  NA 139 138 137  K 3.9 3.8 3.8  CL 101 102 104  CO2 23 26 26   GLUCOSE 86 87 91  BUN 11 11 7*  CREATININE 1.34* 1.21 1.22  CALCIUM 9.2 8.6* 8.4*  MG  --   --  1.9  PHOS  --  2.6 2.6    CBG: Recent Labs  Lab 07/02/23 2109 07/02/23 2350 07/03/23 0521 07/03/23 0705 07/03/23 1111  GLUCAP 119* 111* 95 105* 79    No results found for this or any previous visit (from the past 240 hour(s)).   Radiology Studies: CT ABDOMEN PELVIS W CONTRAST  Result Date: 07/01/2023 CLINICAL DATA:  Abnormal bowel movements for 1 month, nausea, unintentional weight loss, prior history of lymphoma EXAM: CT ABDOMEN AND PELVIS WITH CONTRAST TECHNIQUE: Multidetector CT imaging of the abdomen and pelvis was performed using the standard protocol following bolus administration of intravenous contrast. RADIATION DOSE REDUCTION: This exam was performed according to the departmental dose-optimization program which includes automated exposure control, adjustment of the mA and/or kV according to patient size and/or use of iterative reconstruction technique. CONTRAST:  OMNIPAQUE IOHEXOL 300 MG/ML  SOLN COMPARISON:  08/04/2019 FINDINGS:  Lower chest: There are small bilateral pleural effusions. New bilateral pleural calcifications could reflect sequela of prior pleurodesis or trauma. No acute airspace disease. Chronic scarring at the right lung base. Hepatobiliary: No focal liver abnormality is seen. No gallstones, gallbladder wall thickening, or biliary dilatation. Pancreas: Unremarkable. No pancreatic ductal dilatation or surrounding inflammatory changes. Spleen: Normal in size without focal abnormality. Adrenals/Urinary Tract: Adrenal glands are unremarkable. Kidneys are normal, without renal calculi, focal lesion, or hydronephrosis. Bladder is unremarkable. Stomach/Bowel: There is irregular wall thickening of the gastric antrum and pylorus, measuring up to 19 mm in maximal thickness. This likely results in an element of gastric outlet obstruction, with distension of the proximal stomach. Differential would include gastric lymphoma versus adenocarcinoma. Endoscopy is recommended for further evaluation. No other signs of bowel obstruction or ileus. Mild fecal retention throughout the colon. Vascular/Lymphatic: Numerous subcentimeter lymph nodes surround the gastric antral wall thickening, measuring up to 9 mm in short axis reference image 37/2. Additionally, there are numerous mesenteric nodules throughout the right upper quadrant which could reflect additional mesenteric adenopathy versus peritoneal carcinomatosis. Largest area measures 2.4 x 2.9 cm reference image 39/2. There is  diffuse atherosclerosis of the aorta and its branches. Incidental 1 cm aneurysm extending inferiorly from the main right renal artery, reference image 58/4, not significantly changed. Reproductive: Prostate is unremarkable. Fiduciary markers are seen within the prostate. Other: Trace free fluid within the lower pelvis. No free intraperitoneal gas. No abdominal wall hernia. Musculoskeletal: There are no acute or destructive bony abnormalities. Postsurgical changes are  again noted within the midthoracic spine. Reconstructed images demonstrate no additional findings. IMPRESSION: 1. New irregular mural thickening throughout the gastric antrum, concerning for gastric carcinoma versus gastric lymphoma. Endoscopy is recommended for further evaluation. The mural thickening likely results in an element of gastric outlet obstruction, with distension of the proximal stomach. 2. Numerous subcentimeter lymph nodes surrounding the thickened gastric antrum, as well as numerous mesenteric nodules throughout the right upper quadrant, consistent with lymphadenopathy and carcinomatosis. 3. Small bilateral pleural effusions, with new pleural calcifications bilaterally likely sequela of interval pleurodesis or trauma. 4. 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 5.  Aortic Atherosclerosis (ICD10-I70.0). Electronically Signed   By: Sharlet Salina M.D.   On: 07/01/2023 20:12    Scheduled Meds:  feeding supplement  1 Container Oral TID BM   metoprolol tartrate  2.5 mg Intravenous Q6H   Continuous Infusions:   LOS: 2 days   Time spent: 40 mins  Henya Aguallo Laural Benes, MD How to contact the [facility] Attending or Consulting provider 7A - 7P or covering provider during after hours 7P -7A, for this patient?  Check the care team in [facility] and look for a) attending/consulting [facility] provider listed and b) the [facility] team listed Log into [email or web address] to find provider on call.  Locate the [facility] provider you are looking for under [facility] and page to a number that you can be directly reached. If you still have difficulty reaching the provider, please page the [facility] (Director on Call) for the Hospitalists listed on amion for assistance.  07/03/2023, 4:17 PM

## 2023-07-03 NOTE — Consult Note (Signed)
[facility] Consult  Reason for Consult: Gastric mass with gastric outlet obstruction Referring Physician: Dr. Laural Benes  Chief Complaint   Constipation     HPI: Frank Deppe. is a 72 y.o. male who was admitted to the hospital with gastric outlet obstruction and concern for a gastric malignancy.  He has been having early satiety, weight loss, nausea, vomiting, and failure to thrive.  He believes that the symptoms have been going on for about 6 months.  He has a past medical history significant for atrial fibrillation on Eliquis, diastolic heart failure, hypertension, diffuse large B-cell lymphoma status post stem cell transplant in 2007, and prostate cancer status post radiation.  He denies any history of abdominal surgeries.  He underwent a CT of the abdomen and pelvis which demonstrated a new irregular mural thickening throughout the gastric antrum, concerning for gastric carcinoma versus gastric lymphoma, numerous subcentimeter lymph nodes surrounding the thickened gastric antrum as well as numerous mesenteric nodules throughout the right upper quadrant, consistent with lymphadenopathy and carcinomatosis.  His blood work is within normal limits.  He underwent an EGD yesterday, which demonstrated a large amount of food residue within the stomach, severe inflammation with nodularity of the entire stomach, a large fungating infiltrative and ulcerated circumferential mass of the gastric antrum and pylorus, and patent pylorus with normal first and second portions of the duodenum.  NG tube was placed, and patient was made NPO.  His NG tube subsequently fell out overnight.  Patient currently was able to drink some liquids, though he still feels distended in his upper abdomen.  He denies significant pain.  Past Medical History:  Diagnosis Date   Asthma    as child   Diabetes mellitus without complication (HCC)    DLBCL (diffuse large B cell lymphoma) (HCC) 02/28/2009   Edema     Heart failure, diastolic, chronic (HCC)    patient denies   Hyperlipidemia, mixed    Hypertension    IDA (iron deficiency anemia)    Large cell lymphoma (HCC) 01/2005   autologous stem cell transplant 12/2005   Obesity, morbid (more than 100 lbs over ideal weight or BMI > 40) (HCC)    Venous insufficiency 04/29/2011    Past Surgical History:  Procedure Laterality Date   BACK SURGERY  2006   T6 vertebrae removed/titanium placed   CATARACT EXTRACTION W/PHACO Right 03/31/2019   Procedure: CATARACT EXTRACTION PHACO AND INTRAOCULAR LENS PLACEMENT (IOC);  Surgeon: Fabio Pierce, MD;  Location: [facility];  Service: Ophthalmology;  Laterality: Right;  CDE: 3.21   CATARACT EXTRACTION W/PHACO Left 04/14/2019   Procedure: CATARACT EXTRACTION PHACO AND INTRAOCULAR LENS PLACEMENT (IOC);  Surgeon: Fabio Pierce, MD;  Location: [facility];  Service: Ophthalmology;  Laterality: Left;  left - pt knows to arrive at 7:45, CDE: 3.55   COLONOSCOPY  11/24/2004   Polyps in the left colon ablated/removed as described above.  Two submucosal lesions consistent with lipomas as described above not  manipulated./ Normal rectum   COLONOSCOPY  06/20/2012   MULTIPLE RECTAL AND COLONIC POLYPS   COLONOSCOPY N/A 03/08/2015   RMR: Capacious, redundant colon. Multiple colonic and rectosigmoid polyps removed. ablated as described above. colonic lipoma abnormal appearing terminal ileum likely a variant of normal). Howeverwith history  biopsies obtained.    ESOPHAGOGASTRODUODENOSCOPY N/A 03/08/2015   RMR: Hiatal hernia Polypoid gastric mucosa with multiple gastric polyps. largest polyp removed via snare polypectomgy hemostasis clip placed at base. Status post gastric biopsy. Status post video capsule placement.  GIVENS CAPSULE STUDY N/A 03/08/2015   Procedure: GIVENS CAPSULE STUDY;  Surgeon: Corbin Ade, MD;  Location: [facility];  Service: Endoscopy;  Laterality: N/A;   GOLD SEED IMPLANT N/A 09/08/2022   Procedure: GOLD SEED  IMPLANT;  Surgeon: Malen Gauze, MD;  Location: [facility];  Service: Urology;  Laterality: N/A;   LIMBAL STEM CELL TRANSPLANT     LUNG BIOPSY  6/06   MULTIPLE TOOTH EXTRACTIONS  04/2005   port a cath placement     PORT-A-CATH REMOVAL  09/08/2012   Procedure: REMOVAL PORT-A-CATH;  Surgeon: Loreli Slot, MD;  Location: [facility] OR;  Service: Thoracic;  Laterality: N/A;   SPACE OAR INSTILLATION N/A 09/08/2022   Procedure: SPACE OAR INSTILLATION;  Surgeon: Malen Gauze, MD;  Location: [facility];  Service: Urology;  Laterality: N/A;    Family History  Problem Relation Age of Onset   Cancer Mother        lung   Hypertension Mother    Hyperlipidemia Mother    Cancer Father        prostate   Hypertension Father    Colon cancer Neg Hx    Diabetes Neg Hx     Social History   Tobacco Use   Smoking status: Former    Current packs/day: 0.00    Average packs/day: 0.5 packs/day for 15.0 years (7.5 ttl pk-yrs)    Types: Cigarettes    Start date: 11/11/1987    Quit date: 11/11/2002    Years since quitting: 20.6   Smokeless tobacco: Never  Vaping Use   Vaping status: Never Used  Substance Use Topics   Alcohol use: No    Alcohol/week: 0.0 standard drinks of alcohol   Drug use: No    Medications: I have reviewed the patient's current medications.  Allergies  Allergen Reactions   Penicillins Rash     ROS:  Pertinent items are noted in HPI.  Blood pressure 128/69, pulse 95, temperature 98.5 F (36.9 C), temperature source Oral, resp. rate 17, height 5\' 11"  (1.803 m), weight 90.5 kg, SpO2 99%. Physical Exam Vitals reviewed.  Constitutional:      Appearance: Normal appearance.  HENT:     Head: Normocephalic and atraumatic.  Eyes:     Extraocular Movements: Extraocular movements intact.     Pupils: Pupils are equal, round, and reactive to light.  Cardiovascular:     Rate and Rhythm: Normal rate.  Pulmonary:     Effort: Pulmonary effort is normal.  Abdominal:      Comments: Abdomen soft, mild distention, no percussion tenderness, nontender to palpation; no rigidity, guarding, rebound tenderness  Musculoskeletal:        General: Normal range of motion.     Cervical back: Normal range of motion.  Skin:    General: Skin is warm and dry.  Neurological:     General: No focal deficit present.     Mental Status: He is alert and oriented to person, place, and time.  Psychiatric:        Mood and Affect: Mood normal.        Behavior: Behavior normal.     Results: Results for orders placed or performed during the hospital encounter of 07/01/23 (from the past 48 hour(s))  CBC with Differential     Status: Abnormal   Collection Time: 07/01/23  2:51 PM  Result Value Ref Range   WBC 3.8 (L) 4.0 - 10.5 K/uL   RBC 3.80 (L) 4.22 - 5.81  MIL/uL   Hemoglobin 11.5 (L) 13.0 - 17.0 g/dL   HCT 88.4 (L) 16.6 - 06.3 %   MCV 96.1 80.0 - 100.0 fL   MCH 30.3 26.0 - 34.0 pg   MCHC 31.5 30.0 - 36.0 g/dL   RDW 01.6 (H) 01.0 - 93.2 %   Platelets 181 150 - 400 K/uL   nRBC 0.0 0.0 - 0.2 %   Neutrophils Relative % 71 %   Neutro Abs 2.7 1.7 - 7.7 K/uL   Lymphocytes Relative 11 %   Lymphs Abs 0.4 (L) 0.7 - 4.0 K/uL   Monocytes Relative 12 %   Monocytes Absolute 0.5 0.1 - 1.0 K/uL   Eosinophils Relative 5 %   Eosinophils Absolute 0.2 0.0 - 0.5 K/uL   Basophils Relative 1 %   Basophils Absolute 0.0 0.0 - 0.1 K/uL   Immature Granulocytes 0 %   Abs Immature Granulocytes 0.00 0.00 - 0.07 K/uL    Comment: Performed at [facility], [address] [place], [place] [address]  Comprehensive metabolic panel     Status: Abnormal   Collection Time: 07/01/23  2:51 PM  Result Value Ref Range   Sodium 139 135 - 145 mmol/L   Potassium 3.9 3.5 - 5.1 mmol/L   Chloride 101 98 - 111 mmol/L   CO2 23 22 - 32 mmol/L   Glucose, Bld 86 70 - 99 mg/dL    Comment: Glucose reference range applies only to samples taken after fasting for at least 8 hours.   BUN 11 8 - 23 mg/dL   Creatinine,  Ser 2.20 (H) 0.61 - 1.24 mg/dL   Calcium 9.2 8.9 - 25.4 mg/dL   Total Protein 6.3 (L) 6.5 - 8.1 g/dL   Albumin 3.4 (L) 3.5 - 5.0 g/dL   AST 17 15 - 41 U/L   ALT 10 0 - 44 U/L   Alkaline Phosphatase 54 38 - 126 U/L   Total Bilirubin 1.0 <1.2 mg/dL   GFR, Estimated 57 (L) >60 mL/min    Comment: (NOTE) Calculated using the CKD-EPI Creatinine Equation (2021)    Anion gap 15 5 - 15    Comment: Performed at [facility], [address], [place], [place] [address]  Lipase, blood     Status: None   Collection Time: 07/01/23  2:56 PM  Result Value Ref Range   Lipase 34 11 - 51 U/L    Comment: Performed at [facility], [address], [place], [place] [address]  Urinalysis, Routine w reflex microscopic -Urine, Clean Catch     Status: Abnormal   Collection Time: 07/01/23  7:20 PM  Result Value Ref Range   Color, Urine YELLOW YELLOW   APPearance HAZY (A) CLEAR   Specific Gravity, Urine 1.010 1.005 - 1.030   pH 5.0 5.0 - 8.0   Glucose, UA NEGATIVE NEGATIVE mg/dL   Hgb urine dipstick SMALL (A) NEGATIVE   Bilirubin Urine NEGATIVE NEGATIVE   Ketones, ur 20 (A) NEGATIVE mg/dL   Protein, ur 315 (A) NEGATIVE mg/dL   Nitrite NEGATIVE NEGATIVE   Leukocytes,Ua NEGATIVE NEGATIVE   RBC / HPF 0-5 0 - 5 RBC/hpf   WBC, UA 0-5 0 - 5 WBC/hpf   Bacteria, UA RARE (A) NONE SEEN   Squamous Epithelial / HPF 6-10 0 - 5 /HPF   Mucus PRESENT    Hyaline Casts, UA PRESENT     Comment: Performed at [facility], [address] [place], [place] [address]  Glucose, capillary  Status: None   Collection Time: 07/02/23 12:04 AM  Result Value Ref Range   Glucose-Capillary 77 70 - 99 mg/dL    Comment: Glucose reference range applies only to samples taken after fasting for at least 8 hours.  Glucose, capillary     Status: None   Collection Time: 07/02/23  4:07 AM  Result Value Ref Range   Glucose-Capillary 82 70 - 99 mg/dL    Comment: Glucose reference range applies only to samples taken after fasting for at  least 8 hours.  Basic metabolic panel     Status: Abnormal   Collection Time: 07/02/23  4:20 AM  Result Value Ref Range   Sodium 138 135 - 145 mmol/L   Potassium 3.8 3.5 - 5.1 mmol/L   Chloride 102 98 - 111 mmol/L   CO2 26 22 - 32 mmol/L   Glucose, Bld 87 70 - 99 mg/dL    Comment: Glucose reference range applies only to samples taken after fasting for at least 8 hours.   BUN 11 8 - 23 mg/dL   Creatinine, Ser 8.46 0.61 - 1.24 mg/dL   Calcium 8.6 (L) 8.9 - 10.3 mg/dL   GFR, Estimated >96 >29 mL/min    Comment: (NOTE) Calculated using the CKD-EPI Creatinine Equation (2021)    Anion gap 10 5 - 15    Comment: Performed at [facility], [address], [place], [place] [address]  CBC     Status: Abnormal   Collection Time: 07/02/23  4:20 AM  Result Value Ref Range   WBC 3.8 (L) 4.0 - 10.5 K/uL   RBC 3.28 (L) 4.22 - 5.81 MIL/uL   Hemoglobin 10.1 (L) 13.0 - 17.0 g/dL   HCT 32.4 (L) 40.1 - 02.7 %   MCV 97.0 80.0 - 100.0 fL   MCH 30.8 26.0 - 34.0 pg   MCHC 31.8 30.0 - 36.0 g/dL   RDW 25.3 (H) 66.4 - 40.3 %   Platelets 154 150 - 400 K/uL   nRBC 0.0 0.0 - 0.2 %    Comment: Performed at [facility], [address], [place], [place] [address]  Phosphorus     Status: None   Collection Time: 07/02/23  4:20 AM  Result Value Ref Range   Phosphorus 2.6 2.5 - 4.6 mg/dL    Comment: Performed at [facility], [address] [place], [place] [address]  Glucose, capillary     Status: Abnormal   Collection Time: 07/02/23  7:25 AM  Result Value Ref Range   Glucose-Capillary 101 (H) 70 - 99 mg/dL    Comment: Glucose reference range applies only to samples taken after fasting for at least 8 hours.   Comment 1 Notify RN    Comment 2 Document in Chart   Glucose, capillary     Status: None   Collection Time: 07/02/23 10:00 AM  Result Value Ref Range   Glucose-Capillary 84 70 - 99 mg/dL    Comment: Glucose reference range applies only to samples taken after fasting for at least 8 hours.  Glucose,  capillary     Status: None   Collection Time: 07/02/23  4:20 PM  Result Value Ref Range   Glucose-Capillary 90 70 - 99 mg/dL    Comment: Glucose reference range applies only to samples taken after fasting for at least 8 hours.   Comment 1 Notify RN    Comment 2 Document in Chart   Glucose, capillary     Status: Abnormal   Collection Time: 07/02/23  9:09 PM  Result Value Ref Range   Glucose-Capillary 119 (H) 70 - 99 mg/dL    Comment: Glucose reference range applies only to samples taken after fasting for at least 8 hours.  Glucose, capillary     Status: Abnormal   Collection Time: 07/02/23 11:50 PM  Result Value Ref Range   Glucose-Capillary 111 (H) 70 - 99 mg/dL    Comment: Glucose reference range applies only to samples taken after fasting for at least 8 hours.  CBC     Status: Abnormal   Collection Time: 07/03/23  4:56 AM  Result Value Ref Range   WBC 4.6 4.0 - 10.5 K/uL   RBC 3.25 (L) 4.22 - 5.81 MIL/uL   Hemoglobin 9.7 (L) 13.0 - 17.0 g/dL   HCT 78.2 (L) 95.6 - 21.3 %   MCV 97.2 80.0 - 100.0 fL   MCH 29.8 26.0 - 34.0 pg   MCHC 30.7 30.0 - 36.0 g/dL   RDW 08.6 (H) 57.8 - 46.9 %   Platelets 153 150 - 400 K/uL   nRBC 0.0 0.0 - 0.2 %    Comment: Performed at [facility], [address] [place], [place] [address]  Comprehensive metabolic panel     Status: Abnormal   Collection Time: 07/03/23  4:56 AM  Result Value Ref Range   Sodium 137 135 - 145 mmol/L   Potassium 3.8 3.5 - 5.1 mmol/L   Chloride 104 98 - 111 mmol/L   CO2 26 22 - 32 mmol/L   Glucose, Bld 91 70 - 99 mg/dL    Comment: Glucose reference range applies only to samples taken after fasting for at least 8 hours.   BUN 7 (L) 8 - 23 mg/dL   Creatinine, Ser 8.41 0.61 - 1.24 mg/dL   Calcium 8.4 (L) 8.9 - 10.3 mg/dL   Total Protein 5.0 (L) 6.5 - 8.1 g/dL   Albumin 2.7 (L) 3.5 - 5.0 g/dL   AST 13 (L) 15 - 41 U/L   ALT 10 0 - 44 U/L   Alkaline Phosphatase 45 38 - 126 U/L   Total Bilirubin 0.7 <1.2 mg/dL   GFR,  Estimated >32 >44 mL/min    Comment: (NOTE) Calculated using the CKD-EPI Creatinine Equation (2021)    Anion gap 7 5 - 15    Comment: Performed at [facility], [address], [place], [place] [address]  Magnesium     Status: None   Collection Time: 07/03/23  4:56 AM  Result Value Ref Range   Magnesium 1.9 1.7 - 2.4 mg/dL    Comment: Performed at [facility], [address] [place], [place] [address]  Phosphorus     Status: None   Collection Time: 07/03/23  4:56 AM  Result Value Ref Range   Phosphorus 2.6 2.5 - 4.6 mg/dL    Comment: Performed at [facility], [address], [place], [place] [address]  Glucose, capillary     Status: None   Collection Time: 07/03/23  5:21 AM  Result Value Ref Range   Glucose-Capillary 95 70 - 99 mg/dL    Comment: Glucose reference range applies only to samples taken after fasting for at least 8 hours.  Glucose, capillary     Status: Abnormal   Collection Time: 07/03/23  7:05 AM  Result Value Ref Range   Glucose-Capillary 105 (H) 70 - 99 mg/dL    Comment: Glucose reference range applies only to samples taken after fasting for at least 8 hours.  CT ABDOMEN PELVIS W CONTRAST  Result Date: 07/01/2023 CLINICAL DATA:  Abnormal bowel movements for 1 month, nausea, unintentional weight loss, prior history of lymphoma EXAM: CT ABDOMEN AND PELVIS WITH CONTRAST TECHNIQUE: Multidetector CT imaging of the abdomen and pelvis was performed using the standard protocol following bolus administration of intravenous contrast. RADIATION DOSE REDUCTION: This exam was performed according to the departmental dose-optimization program which includes automated exposure control, adjustment of the mA and/or kV according to patient size and/or use of iterative reconstruction technique. CONTRAST:  OMNIPAQUE IOHEXOL 300 MG/ML  SOLN COMPARISON:  08/04/2019 FINDINGS: Lower chest: There are small bilateral pleural effusions. New bilateral pleural calcifications could reflect  sequela of prior pleurodesis or trauma. No acute airspace disease. Chronic scarring at the right lung base. Hepatobiliary: No focal liver abnormality is seen. No gallstones, gallbladder wall thickening, or biliary dilatation. Pancreas: Unremarkable. No pancreatic ductal dilatation or surrounding inflammatory changes. Spleen: Normal in size without focal abnormality. Adrenals/Urinary Tract: Adrenal glands are unremarkable. Kidneys are normal, without renal calculi, focal lesion, or hydronephrosis. Bladder is unremarkable. Stomach/Bowel: There is irregular wall thickening of the gastric antrum and pylorus, measuring up to 19 mm in maximal thickness. This likely results in an element of gastric outlet obstruction, with distension of the proximal stomach. Differential would include gastric lymphoma versus adenocarcinoma. Endoscopy is recommended for further evaluation. No other signs of bowel obstruction or ileus. Mild fecal retention throughout the colon. Vascular/Lymphatic: Numerous subcentimeter lymph nodes surround the gastric antral wall thickening, measuring up to 9 mm in short axis reference image 37/2. Additionally, there are numerous mesenteric nodules throughout the right upper quadrant which could reflect additional mesenteric adenopathy versus peritoneal carcinomatosis. Largest area measures 2.4 x 2.9 cm reference image 39/2. There is diffuse atherosclerosis of the aorta and its branches. Incidental 1 cm aneurysm extending inferiorly from the main right renal artery, reference image 58/4, not significantly changed. Reproductive: Prostate is unremarkable. Fiduciary markers are seen within the prostate. Other: Trace free fluid within the lower pelvis. No free intraperitoneal gas. No abdominal wall hernia. Musculoskeletal: There are no acute or destructive bony abnormalities. Postsurgical changes are again noted within the midthoracic spine. Reconstructed images demonstrate no additional findings. IMPRESSION:  1. New irregular mural thickening throughout the gastric antrum, concerning for gastric carcinoma versus gastric lymphoma. Endoscopy is recommended for further evaluation. The mural thickening likely results in an element of gastric outlet obstruction, with distension of the proximal stomach. 2. Numerous subcentimeter lymph nodes surrounding the thickened gastric antrum, as well as numerous mesenteric nodules throughout the right upper quadrant, consistent with lymphadenopathy and carcinomatosis. 3. Small bilateral pleural effusions, with new pleural calcifications bilaterally likely sequela of interval pleurodesis or trauma. 4. 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 5.  Aortic Atherosclerosis (ICD10-I70.0). Electronically Signed   By: Sharlet Salina M.D.   On: 07/01/2023 20:12     Assessment & Plan:  [name]. is a 72 y.o. male who was admitted with a gastric outlet obstruction, and concern for gastric malignancy.  Imaging and blood work evaluated by myself.  -I explained to the patient that his EGD did demonstrate concern for a gastric malignancy.  I recommend that we wait for the final pathology before I perform any surgical interventions.  I explained to the patient, that he will need further workup to evaluate whether he will need chemotherapy/radiation prior to any surgical interventions. -We further discussed that he may require an operation during this admission  to provide him a route through which he can receive appropriate nutrition such as a J-tube or a GJ tube.  Again, will want to await final pathology before making a decision about how to proceed -Given that pylorus was patent, I am okay with clear liquids, but I did explain to the patient that if he begins to have severe nausea and vomiting, NG tube will need to be replaced -Fluids per primary team -Patient will need oncology evaluation once pathology has resulted -Appreciate GI and hospitalist  recommendations  All questions were answered to the satisfaction of the patient.  -- Theophilus Kinds, DO [facility] [address] [place], [place] [address] [phone] (office)

## 2023-07-03 NOTE — Anesthesia Postprocedure Evaluation (Signed)
Anesthesia Post Note  Patient: [name].  Procedure(s) Performed: ESOPHAGOGASTRODUODENOSCOPY (EGD) WITH PROPOFOL BIOPSY  Patient location during evaluation: Phase II Anesthesia Type: General Level of consciousness: awake Pain management: pain level controlled Vital Signs Assessment: post-procedure vital signs reviewed and stable Respiratory status: spontaneous breathing and respiratory function stable Cardiovascular status: blood pressure returned to baseline and stable Postop Assessment: no headache and no apparent nausea or vomiting Anesthetic complications: no Comments: Late entry   No notable events documented.   Last Vitals:  Vitals:   07/03/23 0528 07/03/23 0831  BP: (!) 119/57 128/69  Pulse: 97 95  Resp: 17   Temp: 36.9 C 36.9 C  SpO2: 97% 99%    Last Pain:  Vitals:   07/03/23 0831  TempSrc: Oral  PainSc:                  Windell Norfolk

## 2023-07-03 NOTE — Progress Notes (Signed)
Patient pulled NG tube out, Dr. Carren Rang notified, no new orders at this time.

## 2023-07-04 DIAGNOSIS — K311 Adult hypertrophic pyloric stenosis: Secondary | ICD-10-CM | POA: Diagnosis not present

## 2023-07-04 DIAGNOSIS — K3189 Other diseases of stomach and duodenum: Secondary | ICD-10-CM | POA: Diagnosis not present

## 2023-07-04 DIAGNOSIS — I5032 Chronic diastolic (congestive) heart failure: Secondary | ICD-10-CM | POA: Diagnosis not present

## 2023-07-04 DIAGNOSIS — E782 Mixed hyperlipidemia: Secondary | ICD-10-CM

## 2023-07-04 DIAGNOSIS — I700 Atherosclerosis of aorta: Secondary | ICD-10-CM | POA: Diagnosis not present

## 2023-07-04 LAB — COMPREHENSIVE METABOLIC PANEL
ALT: 9 U/L (ref 0–44)
AST: 12 U/L — ABNORMAL LOW (ref 15–41)
Albumin: 2.7 g/dL — ABNORMAL LOW (ref 3.5–5.0)
Alkaline Phosphatase: 44 U/L (ref 38–126)
Anion gap: 8 (ref 5–15)
BUN: 7 mg/dL — ABNORMAL LOW (ref 8–23)
CO2: 26 mmol/L (ref 22–32)
Calcium: 8.2 mg/dL — ABNORMAL LOW (ref 8.9–10.3)
Chloride: 103 mmol/L (ref 98–111)
Creatinine, Ser: 1.25 mg/dL — ABNORMAL HIGH (ref 0.61–1.24)
GFR, Estimated: >60 mL/min (ref >60)
Glucose, Bld: 104 mg/dL — ABNORMAL HIGH (ref 70–99)
Potassium: 3.4 mmol/L — ABNORMAL LOW (ref 3.5–5.1)
Sodium: 137 mmol/L (ref 135–145)
Total Bilirubin: 0.8 mg/dL (ref <1.2)
Total Protein: 4.9 g/dL — ABNORMAL LOW (ref 6.5–8.1)

## 2023-07-04 LAB — GLUCOSE, CAPILLARY
Glucose-Capillary: 101 mg/dL — ABNORMAL HIGH (ref 70–99)
Glucose-Capillary: 73 mg/dL (ref 70–99)
Glucose-Capillary: 85 mg/dL (ref 70–99)
Glucose-Capillary: 90 mg/dL (ref 70–99)
Glucose-Capillary: 87 mg/dL (ref 70–99)
Glucose-Capillary: 79 mg/dL (ref 70–99)

## 2023-07-04 LAB — CBC
HCT: 31.9 % — ABNORMAL LOW (ref 39.0–52.0)
Hemoglobin: 9.8 g/dL — ABNORMAL LOW (ref 13.0–17.0)
MCH: 30.1 pg (ref 26.0–34.0)
MCHC: 30.7 g/dL (ref 30.0–36.0)
MCV: 97.9 fL (ref 80.0–100.0)
Platelets: 149 10*3/uL — ABNORMAL LOW (ref 150–400)
RBC: 3.26 MIL/uL — ABNORMAL LOW (ref 4.22–5.81)
RDW: 15.9 % — ABNORMAL HIGH (ref 11.5–15.5)
WBC: 3.7 10*3/uL — ABNORMAL LOW (ref 4.0–10.5)
nRBC: 0 % (ref 0.0–0.2)

## 2023-07-04 MED ORDER — ONDANSETRON HCL 4 MG/2ML IJ SOLN
4.0000 mg | Freq: Four times a day (QID) | INTRAMUSCULAR | Status: DC | PRN
  Administered 2023-07-04 – 2023-07-06 (×2): 4 mg via INTRAVENOUS
  Filled 2023-07-04: qty 2

## 2023-07-04 MED ORDER — PROCHLORPERAZINE EDISYLATE 10 MG/2ML IJ SOLN
10.0000 mg | INTRAMUSCULAR | Status: DC | PRN
  Administered 2023-07-12: 10 mg via INTRAVENOUS
  Filled 2023-07-04: qty 2

## 2023-07-04 MED ORDER — ONDANSETRON HCL 4 MG/2ML IJ SOLN
INTRAMUSCULAR | Status: AC
  Filled 2023-07-04: qty 2

## 2023-07-04 MED ORDER — SODIUM CHLORIDE 0.9 % IV SOLN: INTRAVENOUS | Status: DC

## 2023-07-04 NOTE — Progress Notes (Signed)
Katrinka Blazing, M.D. Gastroenterology & Hepatology   Interval History:  No acute events overnight.  Patient reports that he has felt fullness while attempting to drink liquid diet but has not vomited or regurgitated any liquids.  No abdominal pain, abdominal distention, fever or chills.  Inpatient Medications:  Current Facility-Administered Medications:    acetaminophen (TYLENOL) tablet 650 mg, 650 mg, Oral, Q6H PRN **OR** acetaminophen (TYLENOL) suppository 650 mg, 650 mg, Rectal, Q6H PRN, Elgergawy, Dawood S, MD   albuterol (PROVENTIL) (2.5 MG/3ML) 0.083% nebulizer solution 2.5 mg, 2.5 mg, Nebulization, Q2H PRN, Elgergawy, Leana Roe, MD   feeding supplement (BOOST / RESOURCE BREEZE) liquid 1 Container, 1 Container, Oral, TID BM, Johnson, Clanford L, MD, 1 Container at 07/03/23 1930   hydrALAZINE (APRESOLINE) injection 5 mg, 5 mg, Intravenous, Q4H PRN, Elgergawy, Leana Roe, MD   metoprolol tartrate (LOPRESSOR) injection 2.5 mg, 2.5 mg, Intravenous, Q6H, Johnson, Clanford L, MD, 2.5 mg at 07/04/23 0537   I/O    Intake/Output Summary (Last 24 hours) at 07/04/2023 1023 Last data filed at 07/03/2023 1700 Gross per 24 hour  Intake 240 ml  Output --  Net 240 ml     Physical Exam: Temp:  [98 F (36.7 C)-99 F (37.2 C)] 98.3 F (36.8 C) (11/10 0334) Pulse Rate:  [80-82] 80 (11/10 0537) Resp:  [17-20] 17 (11/10 0334) BP: (116-129)/(66-73) 126/66 (11/10 0537) SpO2:  [93 %-100 %] 97 % (11/10 0537)  Temp (24hrs), Avg:98.4 F (36.9 C), Min:98 F (36.7 C), Max:99 F (37.2 C)  GENERAL: The patient is AO x3, in no acute distress. HEENT: Head is normocephalic and atraumatic. EOMI are intact. Mouth is well hydrated and without lesions. NECK: Supple. No masses LUNGS: Clear to auscultation. No presence of rhonchi/wheezing/rales. Adequate chest expansion HEART: RRR, normal s1 and s2. ABDOMEN: Soft, nontender, no guarding, no peritoneal signs, and nondistended. BS +. No masses. EXTREMITIES:  Without any cyanosis, clubbing, rash, lesions or edema. NEUROLOGIC: AOx3, no focal motor deficit. SKIN: no jaundice, no rashes  Laboratory Data: CBC:     Component Value Date/Time   WBC 3.7 (L) 07/04/2023 0425   RBC 3.26 (L) 07/04/2023 0425   HGB 9.8 (L) 07/04/2023 0425   HCT 31.9 (L) 07/04/2023 0425   PLT 149 (L) 07/04/2023 0425   MCV 97.9 07/04/2023 0425   MCH 30.1 07/04/2023 0425   MCHC 30.7 07/04/2023 0425   RDW 15.9 (H) 07/04/2023 0425   LYMPHSABS 0.4 (L) 07/01/2023 1451   MONOABS 0.5 07/01/2023 1451   EOSABS 0.2 07/01/2023 1451   BASOSABS 0.0 07/01/2023 1451   COAG:  Lab Results  Component Value Date   INR 1.24 05/18/2017    BMP:     Latest Ref Rng & Units 07/04/2023    4:25 AM 07/03/2023    4:56 AM 07/02/2023    4:20 AM  BMP  Glucose 70 - 99 mg/dL 409  91  87   BUN 8 - 23 mg/dL 7  7  11    Creatinine 0.61 - 1.24 mg/dL 8.11  9.14  7.82   Sodium 135 - 145 mmol/L 137  137  138   Potassium 3.5 - 5.1 mmol/L 3.4  3.8  3.8   Chloride 98 - 111 mmol/L 103  104  102   CO2 22 - 32 mmol/L 26  26  26    Calcium 8.9 - 10.3 mg/dL 8.2  8.4  8.6     HEPATIC:     Latest Ref Rng & Units 07/04/2023  4:25 AM 07/03/2023    4:56 AM 07/01/2023    2:51 PM  Hepatic Function  Total Protein 6.5 - 8.1 g/dL 4.9  5.0  6.3   Albumin 3.5 - 5.0 g/dL 2.7  2.7  3.4   AST 15 - 41 U/L 12  13  17    ALT 0 - 44 U/L 9  10  10    Alk Phosphatase 38 - 126 U/L 44  45  54   Total Bilirubin <1.2 mg/dL 0.8  0.7  1.0     CARDIAC:  Lab Results  Component Value Date   CKTOTAL 69 08/02/2019   TROPONINI <0.03 05/19/2017      Imaging: I personally reviewed and interpreted the available labs, imaging and endoscopic files.   Assessment/Plan: 72 year old male with a history of A-fib on Eliquis, diastolic heart failure, hypertension, diffuse large B-cell lymphoma status post stem cell transplant 2007, prostate cancer status post radiation, who came to the hospital after presenting worsening satiety,  nausea, vomiting and weight loss.  Patient has presented worsening of his symptoms for the last 2 days and he was not able to have adequate oral intake.  Underwent a CT of the abdomen pelvis with IV contrast that showed diffuse mural thickening throughout the antrum with possible adjacent gastric L obstruction which was concerning for gastric cancer versus lymphoma.  There was presence of numerous subcentimeter lymph nodes surrounding the gastric antrum, as well as mesenteric nodules throughout the right upper quadrant suggestive of lymphadenopathy and carcinomatosis.   Due to this, the patient underwent an EGD during the current admission which showed food in the esophagus and stomach, for which an NG tube was placed.  There was noted nodularity throughout the stomach which was biopsied.  There was a large fungating ulcerated mass in the gastric antrum and pylorus causing partial gastric outlet obstruction which was biopsied.  Normal duodenum.  Pathology is pending.   I discussed with the patient the findings during the most recent endoscopy and the fact that the mass is concerning for a malignancy.  It is unclear if this is a primary gastric cancer or lymphoma, but it appears that he has concern for lymph node and peritoneal involvement based on imaging.  Regarding the next steps on his treatment, we will need to wait until we get the results of the pathology to determine if he will be a candidate for oncologic definitive treatment.  Unfortunately, the gastric mass is causing a partial gastric outlet obstruction and is limiting his food intake.  General surgery was consulted for evaluation -discussed the case with Dr. Robyne Peers -will wait for final biopsy results to determine the type of intervention the patient will benefit most from.  We both agreed that as the patient had some patency through his pylorus, he can continue clear liquids.  Once pathology is back, may need to reach oncology to have a close  follow-up as outpatient.   -Follow pathology result -Clear liquid diet -General Surgery recommendations appreciated -Will need oncology follow-up once pathology is back -Consider CT of the chest with IV contrast for staging - GI service will sign-off, please call us back if you have any more questions.   Katrinka Blazing, MD Gastroenterology and Hepatology [facility] Gastroenterology

## 2023-07-04 NOTE — Progress Notes (Signed)
Patient vomited x 1 copious amount of brown liquid, notified Dr Laural Benes , patient has had very poor appetite today and poor PO intake , he hasn't ate all day even after encouragement from staff. His skin looks dry, IV fluid Normal saline started at 70cc/hour, given zofran through IV for nausea and vomiting     07/04/23 1537  Output (mL)  Emesis 250 mL  Emesis Characteristics  Emesis Appearance Manson Passey

## 2023-07-04 NOTE — Plan of Care (Signed)
  Problem: Education: Goal: Knowledge of General Education information will improve Description: Including pain rating scale, medication(s)/side effects and non-pharmacologic comfort measures Outcome: Progressing   Problem: Activity: Goal: Risk for activity intolerance will decrease Outcome: Progressing   Problem: Coping: Goal: Level of anxiety will decrease Outcome: Progressing   

## 2023-07-04 NOTE — Progress Notes (Signed)
PROGRESS NOTE   [name].  NUU:[id number] DOB: 07/19/1951 DOA: 07/01/2023 PCP: [name], MD   Chief Complaint  Patient presents with   Constipation   Level of care: Med-Surg  Brief Admission History:  72 y.o. male,  with medical history significant for hypertension, atrial flutter on Eliquis, diastolic heart failure, diabetes mellitus type 2, diffuse large B-cell lymphoma status post stem cell transplant 2007, iron deficiency anemia, recent diagnosis of prostate cancer, status postradiation. -Patient presents to ED secondary to complaints of early satiety, bloating, poor appetite and oral intake, belching, nausea, but no vomiting, as well he does report bloating, he denies fever, chills, he reports significant weight loss due to poor oral intake from his GI symptoms. -In ED workup significant for creatinine at 1.34 which is around her baseline, hemoglobin stable at 11.5, white blood cell count at 3.8, patient had CT abdomen pelvis done which was significant for irregular thickening throughout the gastric antrum concerning for gastric carcinoma versus gastric lymphoma, with an element of gastric outlet obstruction with surrounding lymphadenopathy, possible carcinomatosis.   Assessment and Plan:  Gastric output obstruction due to gastric malignancy Nearly satiety/weight loss Failure to thrive -CT abdomen pelvis with concern of gastric outlet obstruction, with possible malignancy and lymphadenopathy -patient will be kept n.p.o. endoscopy by GI with Dr. [name] -Will hold apixaban for now pending surgery recommendations -Continue with clear liquids for now    History of autoimmune hemolytic anemia -Hgb  is stable   Bilateral pleural effusion -Patient is currently on room air, but this may need further workup if malignancy is confirmed if this is related to malignancy.   History of prostate cancer -Patient received radiation therapy earlier this year, per him and his wife  report his PSA has been within normal limit, he is following with urology   History of Diffuse large B-cell lymphoma: Diagnosed in November 2006, treated with R-CHOP chemotherapy with incomplete response.  He is followed by Dr [name].    Atrial flutter on Eliquis, rate controlled - Will hold Eliquis pending surgery recommendations - currently not on AV blocking agents.   DM2, not on insulin -monitoring   CBG (last 3)  Recent Labs    07/04/23 0410 07/04/23 0713 07/04/23 1112  GLUCAP 101* 85 90   HLD Resume statin when able to take oral  DVT prophylaxis: SCD Code Status: Full  Family Communication: discussed with wife at length on phone 11/8 Disposition: TBD   Consultants:  GI:  Procedures:  EGD 07/02/23 Dr. [name] FINDINGS:  - Food in the esophagus.  - A large amount of food ( residue) in the stomach precluding visualization on retroflexion  - Gastritis. Biopsied.  - Large circumferential fungating malignant gastric tumor at the pylorus and in the gastric antrum. Able to traversed with adult endoscope . Biopsied.  - Normal duodenal bulb and second portion of the duodenum.  - Feeding tube placement was successfully performed for decompression  Antimicrobials:    Subjective: He is having abdominal fullness but seems to be tolerating clear liquids diet    Objective: Vitals:   07/03/23 2307 07/04/23 0334 07/04/23 0537 07/04/23 1205  BP: 129/73 117/66 126/66 129/73  Pulse: 81 82 80 80  Resp:  17    Temp:  98.3 F (36.8 C)    TempSrc:  Oral    SpO2: 100% 98% 97%   Weight:      Height:        Intake/Output Summary (Last 24 hours) at 07/04/2023  1232 Last data filed at 07/03/2023 1700 Gross per 24 hour  Intake 240 ml  Output --  Net 240 ml   Filed Weights   07/01/23 1427 07/01/23 2216  Weight: 93.9 kg 90.5 kg   Examination:  General exam: Appears calm and comfortable  Respiratory system: Clear to auscultation. Respiratory effort normal. Cardiovascular  system: normal S1 & S2 heard. No JVD, murmurs, rubs, gallops or clicks. No pedal edema. Gastrointestinal system: Abdomen is mildly distended, soft and nontender. No organomegaly or masses felt. Normal bowel sounds heard. Central nervous system: Alert and oriented. No focal neurological deficits. Extremities: Symmetric 5 x 5 power. Skin: No rashes, lesions or ulcers. Psychiatry: Judgement and insight appear normal. Mood & affect appropriate.   Data Reviewed: I have personally reviewed following labs and imaging studies  CBC: Recent Labs  Lab 07/01/23 1451 07/02/23 0420 07/03/23 0456 07/04/23 0425  WBC 3.8* 3.8* 4.6 3.7*  NEUTROABS 2.7  --   --   --   HGB 11.5* 10.1* 9.7* 9.8*  HCT 36.5* 31.8* 31.6* 31.9*  MCV 96.1 97.0 97.2 97.9  PLT 181 154 153 149*    Basic Metabolic Panel: Recent Labs  Lab 07/01/23 1451 07/02/23 0420 07/03/23 0456 07/04/23 0425  NA 139 138 137 137  K 3.9 3.8 3.8 3.4*  CL 101 102 104 103  CO2 23 26 26 26   GLUCOSE 86 87 91 104*  BUN 11 11 7* 7*  CREATININE 1.34* 1.21 1.22 1.25*  CALCIUM 9.2 8.6* 8.4* 8.2*  MG  --   --  1.9  --   PHOS  --  2.6 2.6  --     CBG: Recent Labs  Lab 07/03/23 2316 07/04/23 0335 07/04/23 0410 07/04/23 0713 07/04/23 1112  GLUCAP 94 73 101* 85 90    No results found for this or any previous visit (from the past 240 hour(s)).   Radiology Studies: No results found.  Scheduled Meds:  feeding supplement  1 Container Oral TID BM   metoprolol tartrate  2.5 mg Intravenous Q6H   Continuous Infusions:   LOS: 3 days   Time spent: 37 mins  Rolfe Hartsell Laural Benes, MD How to contact the [facility] Attending or Consulting provider 7A - 7P or covering provider during after hours 7P -7A, for this patient?  Check the care team in [facility] and look for a) attending/consulting TRH provider listed and b) the [facility] team listed Log into [email or web address] to find provider on call.  Locate the [facility] provider you are looking for under [facility]  and page to a number that you can be directly reached. If you still have difficulty reaching the provider, please page the [facility] (Director on Call) for the Hospitalists listed on amion for assistance.  07/04/2023, 12:32 PM

## 2023-07-05 ENCOUNTER — Encounter (HOSPITAL_COMMUNITY): Payer: Self-pay | Admitting: Internal Medicine

## 2023-07-05 DIAGNOSIS — K3189 Other diseases of stomach and duodenum: Secondary | ICD-10-CM | POA: Diagnosis not present

## 2023-07-05 DIAGNOSIS — E440 Moderate protein-calorie malnutrition: Secondary | ICD-10-CM | POA: Diagnosis not present

## 2023-07-05 DIAGNOSIS — Z7189 Other specified counseling: Secondary | ICD-10-CM

## 2023-07-05 DIAGNOSIS — K311 Adult hypertrophic pyloric stenosis: Secondary | ICD-10-CM | POA: Diagnosis not present

## 2023-07-05 DIAGNOSIS — Z515 Encounter for palliative care: Secondary | ICD-10-CM

## 2023-07-05 DIAGNOSIS — I700 Atherosclerosis of aorta: Secondary | ICD-10-CM | POA: Diagnosis not present

## 2023-07-05 DIAGNOSIS — C169 Malignant neoplasm of stomach, unspecified: Secondary | ICD-10-CM | POA: Diagnosis not present

## 2023-07-05 LAB — CBC
HCT: 31.4 % — ABNORMAL LOW (ref 39.0–52.0)
Hemoglobin: 9.8 g/dL — ABNORMAL LOW (ref 13.0–17.0)
MCH: 30.4 pg (ref 26.0–34.0)
MCHC: 31.2 g/dL (ref 30.0–36.0)
MCV: 97.5 fL (ref 80.0–100.0)
Platelets: 153 10*3/uL (ref 150–400)
RBC: 3.22 MIL/uL — ABNORMAL LOW (ref 4.22–5.81)
RDW: 15.7 % — ABNORMAL HIGH (ref 11.5–15.5)
WBC: 3.8 10*3/uL — ABNORMAL LOW (ref 4.0–10.5)
nRBC: 0 % (ref 0.0–0.2)

## 2023-07-05 LAB — COMPREHENSIVE METABOLIC PANEL
ALT: 7 U/L (ref 0–44)
AST: 13 U/L — ABNORMAL LOW (ref 15–41)
Albumin: 2.6 g/dL — ABNORMAL LOW (ref 3.5–5.0)
Alkaline Phosphatase: 40 U/L (ref 38–126)
Anion gap: 8 (ref 5–15)
BUN: 8 mg/dL (ref 8–23)
CO2: 25 mmol/L (ref 22–32)
Calcium: 8.1 mg/dL — ABNORMAL LOW (ref 8.9–10.3)
Chloride: 106 mmol/L (ref 98–111)
Creatinine, Ser: 1.21 mg/dL (ref 0.61–1.24)
GFR, Estimated: >60 mL/min (ref >60)
Glucose, Bld: 80 mg/dL (ref 70–99)
Potassium: 3.8 mmol/L (ref 3.5–5.1)
Sodium: 139 mmol/L (ref 135–145)
Total Bilirubin: 1 mg/dL (ref <1.2)
Total Protein: 5 g/dL — ABNORMAL LOW (ref 6.5–8.1)

## 2023-07-05 LAB — GLUCOSE, CAPILLARY
Glucose-Capillary: 63 mg/dL — ABNORMAL LOW (ref 70–99)
Glucose-Capillary: 70 mg/dL (ref 70–99)
Glucose-Capillary: 71 mg/dL (ref 70–99)
Glucose-Capillary: 85 mg/dL (ref 70–99)
Glucose-Capillary: 85 mg/dL (ref 70–99)
Glucose-Capillary: 88 mg/dL (ref 70–99)
Glucose-Capillary: 89 mg/dL (ref 70–99)

## 2023-07-05 MED ORDER — DEXTROSE-SODIUM CHLORIDE 5-0.9 % IV SOLN: INTRAVENOUS | Status: AC

## 2023-07-05 NOTE — Plan of Care (Signed)

## 2023-07-05 NOTE — Plan of Care (Signed)
Rested well during the night. No pain medication needed during the night.  Problem: Education: Goal: Knowledge of General Education information will improve Description: Including pain rating scale, medication(s)/side effects and non-pharmacologic comfort measures Outcome: Progressing   Problem: Health Behavior/Discharge Planning: Goal: Ability to manage health-related needs will improve Outcome: Progressing   Problem: Clinical Measurements: Goal: Ability to maintain clinical measurements within normal limits will improve Outcome: Progressing Goal: Will remain free from infection Outcome: Progressing Goal: Diagnostic test results will improve Outcome: Progressing Goal: Respiratory complications will improve Outcome: Progressing Goal: Cardiovascular complication will be avoided Outcome: Progressing   Problem: Activity: Goal: Risk for activity intolerance will decrease Outcome: Progressing   Problem: Coping: Goal: Level of anxiety will decrease Outcome: Progressing   Problem: Elimination: Goal: Will not experience complications related to bowel motility Outcome: Progressing Goal: Will not experience complications related to urinary retention Outcome: Progressing   Problem: Pain Management: Goal: General experience of comfort will improve Outcome: Progressing   Problem: Safety: Goal: Ability to remain free from injury will improve Outcome: Progressing   Problem: Skin Integrity: Goal: Risk for impaired skin integrity will decrease Outcome: Progressing   Problem: Nutrition: Goal: Adequate nutrition will be maintained Outcome: Not Progressing

## 2023-07-05 NOTE — Progress Notes (Addendum)
PROGRESS NOTE   [name].  YNW:[id number] DOB: 06-27-1951 DOA: 07/01/2023 PCP: [name], MD   Chief Complaint  Patient presents with   Constipation   Level of care: Med-Surg  Brief Admission History:  72 y.o. male,  with medical history significant for hypertension, atrial flutter on Eliquis, diastolic heart failure, diabetes mellitus type 2, diffuse large B-cell lymphoma status post stem cell transplant 2007, iron deficiency anemia, recent diagnosis of prostate cancer, status postradiation. -Patient presents to ED secondary to complaints of early satiety, bloating, poor appetite and oral intake, belching, nausea, but no vomiting, as well he does report bloating, he denies fever, chills, he reports significant weight loss due to poor oral intake from his GI symptoms. -In ED workup significant for creatinine at 1.34 which is around her baseline, hemoglobin stable at 11.5, white blood cell count at 3.8, patient had CT abdomen pelvis done which was significant for irregular thickening throughout the gastric antrum concerning for gastric carcinoma versus gastric lymphoma, with an element of gastric outlet obstruction with surrounding lymphadenopathy, possible carcinomatosis.   Assessment and Plan:  Gastric output obstruction due to gastric malignancy Early satiety/weight loss Failure to thrive -CT abdomen pelvis with concern of gastric outlet obstruction, with possible malignancy and lymphadenopathy -patient will be kept n.p.o. endoscopy by GI with Dr. 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    History of autoimmune hemolytic anemia -Hgb  is stable   Bilateral pleural effusion -Patient is currently on room air, but this  may need further workup if malignancy is confirmed if this is related to malignancy.   History of prostate cancer -Patient received radiation therapy earlier this year, per him and his wife report his PSA has been within normal limit, he is following with urology   History of Diffuse large B-cell lymphoma: Diagnosed in November 2006, treated with R-CHOP chemotherapy with incomplete response.  He is followed by Dr [name].    Atrial flutter on Eliquis, rate controlled - Will hold Eliquis pending surgery recommendations - currently not on AV blocking agents.   DM2, not on insulin -monitoring   CBG (last 3)  Recent Labs    07/05/23 0357 07/05/23 0744 07/05/23 1110  GLUCAP 88 71 70   HLD Resume statin when able to take oral  DVT prophylaxis: SCD Code Status: Full  Family Communication: discussed with wife at length on phone 11/8 Disposition: TBD   Consultants:  GI:  Procedures:  EGD 07/02/23 Dr. [name] FINDINGS:  - Food in the esophagus.  - A large amount of food ( residue) in the stomach precluding visualization on retroflexion  - Gastritis. Biopsied.  - Large circumferential fungating malignant gastric tumor at the pylorus and in the gastric antrum. Able to traversed with adult endoscope . Biopsied.  - Normal duodenal bulb and second portion of the duodenum.  - Feeding tube placement was successfully performed for decompression  Antimicrobials:    Subjective: No further emesis since yesterday     Objective: Vitals:   07/04/23 2100 07/04/23 2312 07/05/23 0355 07/05/23 0615  BP: (!) 120/95 132/74 130/74 128/77  Pulse: 80 80 81   Resp: 19 18 19    Temp: 98.4 F (36.9 C) 98.1 F (36.7 C) 98.6 F (37 C)   TempSrc: Oral Oral Oral   SpO2: 98%  98% 96%   Weight:      Height:        Intake/Output Summary (Last 24 hours) at 07/05/2023 1245 Last data filed at 07/05/2023 0900 Gross per 24 hour  Intake 1717.7 ml  Output 600 ml  Net 1117.7 ml   Filed Weights    07/01/23 1427 07/01/23 2216  Weight: 93.9 kg 90.5 kg   Examination:  General exam: Appears calm and comfortable  Respiratory system: Clear to auscultation. Respiratory effort normal. Cardiovascular system: normal S1 & S2 heard. No JVD, murmurs, rubs, gallops or clicks. No pedal edema. Gastrointestinal system: Abdomen is mildly distended, soft and nontender. No organomegaly or masses felt. Normal bowel sounds heard. Central nervous system: Alert and oriented. No focal neurological deficits. Extremities: Symmetric 5 x 5 power. Skin: No rashes, lesions or ulcers. Psychiatry: Judgement and insight appear normal. Mood & affect appropriate.   Data Reviewed: I have personally reviewed following labs and imaging studies  CBC: Recent Labs  Lab 07/01/23 1451 07/02/23 0420 07/03/23 0456 07/04/23 0425 07/05/23 0416  WBC 3.8* 3.8* 4.6 3.7* 3.8*  NEUTROABS 2.7  --   --   --   --   HGB 11.5* 10.1* 9.7* 9.8* 9.8*  HCT 36.5* 31.8* 31.6* 31.9* 31.4*  MCV 96.1 97.0 97.2 97.9 97.5  PLT 181 154 153 149* 153    Basic Metabolic Panel: Recent Labs  Lab 07/01/23 1451 07/02/23 0420 07/03/23 0456 07/04/23 0425 07/05/23 0416  NA 139 138 137 137 139  K 3.9 3.8 3.8 3.4* 3.8  CL 101 102 104 103 106  CO2 23 26 26 26 25   GLUCOSE 86 87 91 104* 80  BUN 11 11 7* 7* 8  CREATININE 1.34* 1.21 1.22 1.25* 1.21  CALCIUM 9.2 8.6* 8.4* 8.2* 8.1*  MG  --   --  1.9  --   --   PHOS  --  2.6 2.6  --   --     CBG: Recent Labs  Lab 07/04/23 2109 07/05/23 0006 07/05/23 0357 07/05/23 0744 07/05/23 1110  GLUCAP 79 85 88 71 70    No results found for this or any previous visit (from the past 240 hour(s)).   Radiology Studies: No results found.  Scheduled Meds:  feeding supplement  1 Container Oral TID BM   metoprolol tartrate  2.5 mg Intravenous Q6H   Continuous Infusions:  sodium chloride 70 mL/hr at 07/05/23 0624    LOS: 4 days   Time spent: 47 mins  Gerasimos Plotts Laural Benes, MD How to contact  the [facility] Attending or Consulting provider 7A - 7P or covering provider during after hours 7P -7A, for this patient?  Check the care team in [facility] and look for a) attending/consulting TR[facility] provider listed and b) the [facility] team listed Log into [email or web address] to find provider on call.  Locate the [facility] provider you are looking for under [facility] and page to a number that you can be directly reached. If you still have difficulty reaching the provider, please page the [facility] (Director on Call) for the Hospitalists listed on amion for assistance.  07/05/2023, 12:45 PM

## 2023-07-05 NOTE — Consult Note (Signed)
Consultation Note Date: 07/05/2023   Patient Name: [name].  DOB: [birth year]  MRN: [id number]  Age / Sex: 72 y.o., male  PCP: [name], MD Referring Physician: Cleora Fleet, MD  Reason for Consultation: Establishing goals of care  HPI/Patient Profile: 72 y.o. male  with past medical history of HTN, A-flutter on Eliquis, diastolic heart failure, DM2, diffuse large B-cell lymphoma status post stem cell transplant 2007, iron deficiency anemia, recent diagnosis of prostate cancer with radiation, asthma, history of back surgery in 2006, cataract surgery and 2020 right and left, admitted on 07/01/2023 with gastric outlet obstruction due to gastric malignancy, failure to thrive.   Clinical Assessment and Goals of Care: I have reviewed medical records including EPIC notes, labs and imaging, received report from RN, assessed the patient.  [name] is lying quietly in bed.  He appears relatively healthy, but with poor dentition.  He is alert and oriented, able to make his needs known.  There is no family at bedside at this time.  We meet at the bedside to discuss diagnosis prognosis, GOC, EOL wishes, disposition and options.  I introduced Palliative Medicine as specialized medical care for people living with serious illness. It focuses on providing relief from the symptoms and stress of a serious illness. The goal is to improve quality of life for both the patient and the family.  We discussed a brief life review of the patient.  [name] states that he and his wife have been married for 40 years.  He has been living independently at home prior to this illness.    We then focused on their current illness.  Overall, [name] seems knowledgeable about his acute health concerns.  During our visit, hospitalist, Dr. Laural Benes arrives and states that results have returned and he does in fact have  adenocarcinoma.  Dr. Laural Benes shares that he has been in contact with Dr. Ellin Saba at the cancer center here at [facility].  Hopefully, Dr. Ellin Saba will be able to see [name] this afternoon.  I encourage [name] to ask his family to be present.  At this point [name] would like to return home if possible following up with oncology outpatient.  Advanced directives, concepts specific to code status, artifical feeding and hydration, and rehospitalization were considered and discussed.  We talked about the concept of "treat the treatable, but allow a natural passing.  I shared that what we want at 21 is often different than what we want at 51, than what we want if we lived to 80.  He at this point continue full scope/full code.  I encouraged him to consider how long he would want life support.  Palliative Care services outpatient were explained and offered.  [name] ask about palliative care.  He states that his father recently died with palliative/hospice services.  We talked about the differences between palliative and hospice care.  We talked about the benefits of relationship building with outpatient palliative services.  He shares that  he will consider.  Discussed the importance of continued conversation with family and the medical providers regarding overall plan of care and treatment options, ensuring decisions are within the context of the patient's values and GOCs. Questions and concerns were addressed.  The patient was encouraged to call with questions or concerns.  PMT will continue to support holistically.  Face-to-face visit with attending and transition of care team.  Conference with attending, bedside nursing staff, transition of care team related to patient condition, needs, goals of care, disposition.   HCPOA  NEXT OF KIN -wife of 40 years, [name].    SUMMARY OF RECOMMENDATIONS   At this point continue to treat the treatable. To meet with oncology this  afternoon, encouraged him to ask family to be present PMT to follow Goal is to return home and seek oncology treatment outpatient    Code Status/Advance Care Planning: Full code  Symptom Management:  Per hospitalist, no additional needs at this time.  Palliative Prophylaxis:  Frequent Pain Assessment and Oral Care  Additional Recommendations (Limitations, Scope, Preferences): Full Scope Treatment  Psycho-social/Spiritual:  Desire for further Chaplaincy support:no Additional Recommendations: Caregiving  Support/Resources  Prognosis:  Unable to determine based on outcomes and oncology recommendations/treatments offered.   Discharge Planning: Anticipate home with oncology outpatient      Primary Diagnoses: Present on Admission:  Gastric outlet obstruction  Aortic atherosclerosis (HCC)  DIASTOLIC HEART FAILURE, CHRONIC  Essential hypertension, benign  Mixed hyperlipidemia   I have reviewed the medical record, interviewed the patient and family, and examined the patient. The following aspects are pertinent.  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   autologous stem cell transplant 12/2005   Obesity, morbid (more than 100 lbs over ideal weight or BMI > 40) (HCC)    Venous insufficiency 04/29/2011   Social History   Socioeconomic History   Marital status: Married    Spouse name: Not on file   Number of children: 2   Years of education: Not on file   Highest education level: Not on file  Occupational History   Occupation: disability due to back    Employer: UNEMPLOYED  Tobacco Use   Smoking status: Former    Current packs/day: 0.00    Average packs/day: 0.5  packs/day for 15.0 years (7.5 ttl pk-yrs)    Types: Cigarettes    Start date: 11/11/1987    Quit date: 11/11/2002    Years since quitting: 20.6   Smokeless tobacco: Never  Vaping Use   Vaping status: Never Used  Substance and Sexual Activity   Alcohol use: No    Alcohol/week: 0.0 standard drinks of alcohol   Drug use: No   Sexual activity: Yes    Birth control/protection: None  Other Topics Concern   Not on file  Social History Narrative   Married   No regular exercise   Social Determinants of Health   Financial Resource Strain: Low Risk  (06/28/2020)   Overall Financial Resource Strain (CARDIA)    Difficulty of Paying Living Expenses: Not hard at all  Food Insecurity: No Food Insecurity (07/01/2023)   Hunger Vital Sign  Worried About Programme researcher, broadcasting/film/video in the Last Year: Never true    Ran Out of Food in the Last Year: Never true  Transportation Needs: No Transportation Needs (07/01/2023)   PRAPARE - Administrator, Civil Service (Medical): No    Lack of Transportation (Non-Medical): No  Physical Activity: Inactive (06/28/2020)   Exercise Vital Sign    Days of Exercise per Week: 0 days    Minutes of Exercise per Session: 0 min  Stress: No Stress Concern Present (06/28/2020)   [employer] of Occupational Health - Occupational Stress Questionnaire    Feeling of Stress : Not at all  Social Connections: Moderately Integrated (06/28/2020)   Social Connection and Isolation Panel [NHANES]    Frequency of Communication with Friends and Family: More than three times a week    Frequency of Social Gatherings with Friends and Family: Twice a week    Attends Religious Services: More than 4 times per year    Active Member of [employer] or Organizations: No    Attends Engineer, structural: Never    Marital Status: Married   Family History  Problem Relation Age of Onset   Cancer Mother        lung   Hypertension Mother    Hyperlipidemia Mother    Cancer Father         prostate   Hypertension Father    Colon cancer Neg Hx    Diabetes Neg Hx    Scheduled Meds:  feeding supplement  1 Container Oral TID BM   metoprolol tartrate  2.5 mg Intravenous Q6H   Continuous Infusions:  sodium chloride 70 mL/hr at 07/05/23 0624   PRN Meds:.acetaminophen **OR** acetaminophen, albuterol, hydrALAZINE, ondansetron (ZOFRAN) IV, prochlorperazine Medications Prior to Admission:  Prior to Admission medications   Medication Sig Start Date End Date Taking? Authorizing Provider  apixaban (ELIQUIS) 5 MG TABS tablet Take 1 tablet (5 mg total) by mouth 2 (two) times daily. 01/24/18  Yes Laqueta Linden, MD  FEROSUL 325 (65 Fe) MG tablet Take 325 mg by mouth 2 (two) times daily. 04/27/23  Yes [provider]  folic acid (FOLVITE) 1 MG tablet TAKE 1 TABLET(1 MG) BY MOUTH DAILY 04/26/23  Yes Cunliffe, Renda Rolls, NP  furosemide (LASIX) 20 MG tablet Take 1 tablet (20 mg total) by mouth daily as needed for fluid or edema. 07/30/22  Yes Strader, [place] M, PA-C  potassium chloride (KLOR-CON) 10 MEQ tablet Take 10 meq on days you need to take Lasix Patient taking differently: 10 mEq. Take 10 meq on days you need to take Lasix 08/07/22  Yes Strader, [place] M, PA-C  rosuvastatin (CRESTOR) 40 MG tablet Take 40 mg by mouth daily. 05/04/19  Yes [provider]  vitamin B-12 (CYANOCOBALAMIN) 1000 MCG tablet Take 2 tablets (2,000 mcg total) by mouth daily. 06/05/21  Yes Doreatha Massed, MD  Vitamin D, Ergocalciferol, (DRISDOL) 1.25 MG (50000 UNIT) CAPS capsule TAKE 1 CAPSULE BY MOUTH ONCE WEEKLY 08/18/22  Yes Doreatha Massed, MD  Blood Glucose Monitoring Suppl (ONE TOUCH ULTRA 2) w/Device KIT USE TO TEST BLOOD SUGAR ONCE D UTD 05/25/19   [provider]  Lancets (ONETOUCH DELICA PLUS LANCET33G) MISC USE TO TEST TWICE DAILY 11/24/22   Dani Gobble, NP  [facility] ULTRA test strip TEST TWICE DAILY 11/22/20   Roma Kayser, MD   Allergies  Allergen  Reactions   Penicillins Rash   Review of Systems  Unable to perform  ROS: Other    Physical Exam Vitals and nursing note reviewed.  Constitutional:      General: He is not in acute distress.    Appearance: He is not ill-appearing.  HENT:     Head: Normocephalic and atraumatic.     Mouth/Throat:     Mouth: Mucous membranes are moist.  Cardiovascular:     Rate and Rhythm: Normal rate.  Pulmonary:     Effort: Pulmonary effort is normal. No respiratory distress.  Musculoskeletal:        General: No swelling.  Skin:    General: Skin is warm and dry.  Neurological:     Mental Status: He is alert and oriented to person, place, and time.  Psychiatric:        Mood and Affect: Mood normal.        Behavior: Behavior normal.     Vital Signs: BP 124/82 (BP Location: Left Arm)   Pulse 76   Temp 98.2 F (36.8 C) (Oral)   Resp 16   Ht 5\' 11"  (1.803 m)   Wt 90.5 kg   SpO2 100%   BMI 27.83 kg/m  Pain Scale: 0-10   Pain Score: 0-No pain   SpO2: SpO2: 100 % O2 Device:SpO2: 100 % O2 Flow Rate: .O2 Flow Rate (L/min): 2 L/min  IO: Intake/output summary:  Intake/Output Summary (Last 24 hours) at 07/05/2023 1353 Last data filed at 07/05/2023 0900 Gross per 24 hour  Intake 1717.7 ml  Output 600 ml  Net 1117.7 ml    LBM: Last BM Date : 07/01/23 Baseline Weight: Weight: 93.9 kg Most recent weight: Weight: 90.5 kg     Palliative Assessment/Data:     Time In: 1300 Time Out: 1355 Time Total: 55 minutes  Greater than 50%  of this time was spent counseling and coordinating care related to the above assessment and plan.  Signed by: Katheran Awe, NP   Please contact Palliative Medicine Team phone at [phone] for questions and concerns.  For individual provider: See Loretha Stapler

## 2023-07-05 NOTE — Progress Notes (Signed)
Mobility Specialist Progress Note:    07/05/23 0930  Mobility  Activity Ambulated with assistance in hallway;Transferred from bed to chair  Level of Assistance Contact guard assist, steadying assist  Assistive Device Cane  Distance Ambulated (ft) 170 ft  Range of Motion/Exercises Active;All extremities  Activity Response Tolerated well  Mobility Referral Yes  $Mobility charge 1 Mobility  Mobility Specialist Start Time (ACUTE ONLY) 0930  Mobility Specialist Stop Time (ACUTE ONLY) 0950  Mobility Specialist Time Calculation (min) (ACUTE ONLY) 20 min   Pt received in bed, agreeable to mobility. Required CGA to stand and ambulate with cane. Tolerated well, asx throughout. Returned pt to room, left in chair. NT in room, all needs met.   Lawerance Bach Mobility Specialist Please contact via Special educational needs teacher or  Rehab office at [phone]

## 2023-07-05 NOTE — Consult Note (Signed)
[facility] Consultation Oncology  Name: [name].      MRN: [id number]    Location: A339/A339-01  Date: 07/05/2023 Time:5:22 PM   REFERRING PHYSICIAN: Dr. Laural Benes    DIAGNOSIS: Gastric adenocarcinoma  HISTORY OF PRESENT ILLNESS: [name] is a pleasant 72 year old male seen in consultation today at the request of Dr. Laural Benes for newly diagnosed gastric adenocarcinoma.  Patient has been having intermittent vomiting for the last 2 months and last about 38 pounds.  He presented to the ER on 07/01/2023 when CT of the abdomen and pelvis showed mural thickening throughout the stomach with possible gastric outlet obstruction, lymphadenopathy concerning for gastric carcinoma versus lymphoma.  He underwent EGD on 07/02/2023 which showed gastric antral mass.  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  He is currently on clear liquid diet.  His wife, daughter and sister are at bedside.  He has history of DLBCL diagnosed in 2006, treated with R-CHOP with recurrence of lymphoma in 2007, s/p stem cell transplant in May 2007 followed by right X. Mab maintenance completed in 2009.  Family history is significant for mother with lung cancer, father with prostate cancer, brother with cancer in the back, both grandmothers with breast cancer.  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     PAST SURGICAL HISTORY Past Surgical History:  Procedure Laterality Date   BACK SURGERY  2006   T6 vertebrae removed/titanium placed   CATARACT EXTRACTION W/PHACO Right 03/31/2019   Procedure: CATARACT EXTRACTION PHACO AND INTRAOCULAR LENS PLACEMENT (IOC);  Surgeon: Fabio Pierce, MD;  Location: [facility];  Service: Ophthalmology;  Laterality: Right;  CDE: 3.21   CATARACT EXTRACTION W/PHACO Left 04/14/2019   Procedure: CATARACT EXTRACTION PHACO AND INTRAOCULAR LENS PLACEMENT (IOC);  Surgeon: Fabio Pierce, MD;  Location: [facility];  Service: Ophthalmology;  Laterality: Left;  left - pt knows to arrive at 7:45, CDE: 3.55   COLONOSCOPY  11/24/2004   Polyps in the left colon ablated/removed as described above.  Two submucosal lesions consistent with lipomas as described above not  manipulated./ Normal rectum   COLONOSCOPY  06/20/2012   MULTIPLE RECTAL AND COLONIC POLYPS   COLONOSCOPY N/A 03/08/2015   RMR: Capacious, redundant colon. Multiple colonic and rectosigmoid polyps removed. ablated as described above. colonic lipoma abnormal appearing terminal ileum likely a variant of normal). Howeverwith history  biopsies obtained.    ESOPHAGOGASTRODUODENOSCOPY N/A 03/08/2015   RMR: Hiatal hernia Polypoid gastric mucosa with multiple gastric polyps. largest polyp removed via snare polypectomgy hemostasis clip placed at base. Status post gastric biopsy. Status post video capsule placement.    GIVENS CAPSULE STUDY N/A 03/08/2015   Procedure: GIVENS CAPSULE STUDY;  Surgeon: Corbin Ade, MD;  Location: [facility];  Service: Endoscopy;  Laterality: N/A;   GOLD SEED IMPLANT N/A 09/08/2022   Procedure: GOLD SEED IMPLANT;  Surgeon: Malen Gauze, MD;  Location: [facility];  Service: Urology;  Laterality: N/A;   LIMBAL STEM CELL TRANSPLANT     LUNG BIOPSY  6/06   MULTIPLE TOOTH EXTRACTIONS  04/2005   port a cath placement     PORT-A-CATH REMOVAL  09/08/2012   Procedure: REMOVAL  PORT-A-CATH;  Surgeon: Loreli Slot, MD;  Location: [facility] OR;  Service: Thoracic;  Laterality: N/A;   SPACE OAR INSTILLATION N/A 09/08/2022   Procedure: SPACE OAR INSTILLATION;  Surgeon: Malen Gauze, MD;  Location: [facility];  Service: Urology;  Laterality: N/A;    FAMILY HISTORY: Family History  Problem Relation Age of Onset   Cancer Mother        lung   Hypertension Mother    Hyperlipidemia Mother    Cancer Father        prostate   Hypertension Father    Colon cancer Neg Hx    Diabetes Neg Hx     SOCIAL HISTORY:  reports that he quit smoking about 20 years ago. His smoking use included cigarettes. He started smoking about 35 years ago. He has a 7.5 pack-year smoking history. He has never used smokeless tobacco. He reports that he does not drink alcohol and does not use drugs.  PERFORMANCE STATUS: The patient's performance status is 2 - Symptomatic, <50% confined to bed  PHYSICAL EXAM: Most Recent Vital Signs: Blood pressure 124/82, pulse 76, temperature 98.2 F (36.8 C), temperature source Oral, resp. rate 16, height 5\' 11"  (1.803 m), weight 199 lb 8.3 oz (90.5 kg), SpO2 100%. BP 124/82 (BP Location: Left Arm)   Pulse 76   Temp 98.2 F (36.8 C) (Oral)   Resp 16   Ht 5\' 11"  (1.803 m)   Wt 199 lb 8.3 oz (90.5 kg)   SpO2 100%   BMI 27.83 kg/m  General appearance: alert, cooperative, and appears stated age Lungs: clear to auscultation bilaterally Heart: regular rate and rhythm Abdomen:  Soft, nontender, no masses palpable Extremities:  No edema or cyanosis Neurologic: Grossly normal  LABORATORY DATA:  Results for orders placed or performed during the hospital encounter of 07/01/23 (from the past 48 hour(s))  Glucose, capillary     Status: None   Collection Time: 07/03/23  7:26 PM  Result Value Ref Range   Glucose-Capillary 91 70 - 99 mg/dL    Comment: Glucose reference range applies only to samples taken after fasting for at least 8 hours.  Glucose,  capillary     Status: None   Collection Time: 07/03/23 11:16 PM  Result Value Ref Range   Glucose-Capillary 94 70 - 99 mg/dL    Comment: Glucose reference range applies only to samples taken after fasting for at least 8 hours.  Glucose, capillary     Status: None   Collection Time: 07/04/23  3:35 AM  Result Value Ref Range   Glucose-Capillary 73 70 - 99 mg/dL    Comment: Glucose reference range applies only to samples taken after fasting for at least 8 hours.  Glucose, capillary     Status: Abnormal   Collection Time: 07/04/23  4:10 AM  Result Value Ref Range   Glucose-Capillary 101 (H) 70 - 99 mg/dL    Comment: Glucose reference range applies only to samples taken after fasting for at least 8 hours.  CBC     Status: Abnormal   Collection Time: 07/04/23  4:25 AM  Result Value Ref Range   WBC 3.7 (L) 4.0 - 10.5 K/uL   RBC 3.26 (L) 4.22 - 5.81 MIL/uL   Hemoglobin 9.8 (L) 13.0 - 17.0 g/dL   HCT 40.9 (L) 81.1 - 91.4 %   MCV 97.9 80.0 - 100.0 fL   MCH 30.1 26.0 - 34.0 pg   MCHC 30.7 30.0 - 36.0 g/dL   RDW 78.2 (H) 95.6 - 21.3 %   Platelets 149 (L) 150 - 400 K/uL   nRBC 0.0 0.0 - 0.2 %    Comment: Performed at [facility], [address], [place], [place] [address]  Comprehensive metabolic panel     Status: Abnormal   Collection Time: 07/04/23  4:25 AM  Result Value Ref Range   Sodium 137 135 - 145 mmol/L   Potassium 3.4 (L) 3.5 - 5.1 mmol/L   Chloride 103 98 - 111 mmol/L   CO2 26 22 - 32 mmol/L   Glucose, Bld 104 (H) 70 - 99 mg/dL    Comment: Glucose reference range applies only to samples taken after fasting for at least 8 hours.   BUN 7 (L) 8 - 23 mg/dL   Creatinine, Ser 8.46 (H) 0.61 - 1.24 mg/dL   Calcium 8.2 (L) 8.9 - 10.3 mg/dL   Total Protein 4.9 (L) 6.5 - 8.1 g/dL   Albumin 2.7 (L) 3.5 - 5.0 g/dL   AST 12 (L) 15 - 41 U/L   ALT 9 0 - 44 U/L   Alkaline Phosphatase 44 38 - 126 U/L   Total Bilirubin 0.8 <1.2 mg/dL   GFR, Estimated >96 >29 mL/min    Comment:  (NOTE) Calculated using the CKD-EPI Creatinine Equation (2021)    Anion gap 8 5 - 15    Comment: Performed at [facility], [address] [place], [place] [address]  Glucose, capillary     Status: None   Collection Time: 07/04/23  7:13 AM  Result Value Ref Range   Glucose-Capillary 85 70 - 99 mg/dL    Comment: Glucose reference range applies only to samples taken after fasting for at least 8 hours.  Glucose, capillary     Status: None   Collection Time: 07/04/23 11:12 AM  Result Value Ref Range   Glucose-Capillary 90 70 - 99 mg/dL    Comment: Glucose reference range applies only to samples taken after fasting for at least 8 hours.  Glucose, capillary     Status: None   Collection Time: 07/04/23  4:39 PM  Result Value Ref Range   Glucose-Capillary 87 70 - 99 mg/dL    Comment: Glucose reference range applies only to samples taken after fasting for at least 8 hours.   Comment 1 Notify RN    Comment 2 Document in Chart   Glucose, capillary     Status: None   Collection Time: 07/04/23  9:09 PM  Result Value Ref Range   Glucose-Capillary 79 70 - 99 mg/dL    Comment: Glucose reference range applies only to samples taken after fasting for at least 8 hours.  Glucose, capillary     Status: None   Collection Time: 07/05/23 12:06 AM  Result Value Ref Range   Glucose-Capillary 85 70 - 99 mg/dL    Comment: Glucose reference range applies only to samples taken after fasting for at least 8 hours.  Glucose, capillary     Status: None   Collection Time: 07/05/23  3:57 AM  Result Value Ref Range   Glucose-Capillary  88 70 - 99 mg/dL    Comment: Glucose reference range applies only to samples taken after fasting for at least 8 hours.  CBC     Status: Abnormal   Collection Time: 07/05/23  4:16 AM  Result Value Ref Range   WBC 3.8 (L) 4.0 - 10.5 K/uL   RBC 3.22 (L) 4.22 - 5.81 MIL/uL   Hemoglobin 9.8 (L) 13.0 - 17.0 g/dL   HCT 78.2 (L) 95.6 - 21.3 %   MCV 97.5 80.0 - 100.0 fL   MCH 30.4 26.0 -  34.0 pg   MCHC 31.2 30.0 - 36.0 g/dL   RDW 08.6 (H) 57.8 - 46.9 %   Platelets 153 150 - 400 K/uL   nRBC 0.0 0.0 - 0.2 %    Comment: Performed at [facility], [address], [place], [place] [address]  Comprehensive metabolic panel     Status: Abnormal   Collection Time: 07/05/23  4:16 AM  Result Value Ref Range   Sodium 139 135 - 145 mmol/L   Potassium 3.8 3.5 - 5.1 mmol/L   Chloride 106 98 - 111 mmol/L   CO2 25 22 - 32 mmol/L   Glucose, Bld 80 70 - 99 mg/dL    Comment: Glucose reference range applies only to samples taken after fasting for at least 8 hours.   BUN 8 8 - 23 mg/dL   Creatinine, Ser 8.41 0.61 - 1.24 mg/dL   Calcium 8.1 (L) 8.9 - 10.3 mg/dL   Total Protein 5.0 (L) 6.5 - 8.1 g/dL   Albumin 2.6 (L) 3.5 - 5.0 g/dL   AST 13 (L) 15 - 41 U/L   ALT 7 0 - 44 U/L   Alkaline Phosphatase 40 38 - 126 U/L   Total Bilirubin 1.0 <1.2 mg/dL   GFR, Estimated >32 >44 mL/min    Comment: (NOTE) Calculated using the CKD-EPI Creatinine Equation (2021)    Anion gap 8 5 - 15    Comment: Performed at [facility], [address] [place], [place] [address]  Glucose, capillary     Status: None   Collection Time: 07/05/23  7:44 AM  Result Value Ref Range   Glucose-Capillary 71 70 - 99 mg/dL    Comment: Glucose reference range applies only to samples taken after fasting for at least 8 hours.  Glucose, capillary     Status: None   Collection Time: 07/05/23 11:10 AM  Result Value Ref Range   Glucose-Capillary 70 70 - 99 mg/dL    Comment: Glucose reference range applies only to samples taken after fasting for at least 8 hours.  Glucose, capillary     Status: Abnormal   Collection Time: 07/05/23  4:04 PM  Result Value Ref Range   Glucose-Capillary 63 (L) 70 - 99 mg/dL    Comment: Glucose reference range applies only to samples taken after fasting for at least 8 hours.  Glucose, capillary     Status: None   Collection Time: 07/05/23  4:47 PM  Result Value Ref Range   Glucose-Capillary 89  70 - 99 mg/dL    Comment: Glucose reference range applies only to samples taken after fasting for at least 8 hours.   Comment 1 Notify RN    Comment 2 Document in Chart       RADIOGRAPHY: No results found.      ASSESSMENT and PLAN:  1.  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  Numerous mesenteric nodules throughout the right upper quadrant which could reflect adenopathy versus peritoneal carcinomatosis.  Largest area measures 2.4 x 2.9 cm.  Numerous subcentimeter lymph nodes surround the gastric antral wall thickening measuring up to 9 mm in short axis. - EGD (07/02/2023): Large fungating infiltrative and ulcerated circumferential mass in the gastric antrum and at the pylorus.  Able to be transversed with adult endoscope.  Duodenal bulb and second part were normal. - Pathology (07/02/2023): Invasive adenocarcinoma with signet ring cell features. - I have discussed scan findings and pathology with the patient and his family in detail. - He wants to have treatments done for his new diagnosis of gastric cancer.  We need further imaging in the form of PET scan after his discharge.  This will help [place] confirm if the mesenteric nodules are malignant.  At that point, we will treat him as an advanced gastric cancer.  We will also send NGS testing on the biopsy to aid treatments. - He will follow-up with me in the office after discharge.  2.  Gastric outlet obstruction: - Recommend surgical palliation with gastrojejunostomy.  If not feasible, J-tube placement is an option.  All questions were answered. The patient knows to call the clinic with any problems, questions or concerns. We can certainly see the patient much sooner if necessary.    Doreatha Massed

## 2023-07-05 NOTE — Progress Notes (Signed)
Gastric mass biopsy results consistent with Invasive adenocarcinoma with signet ring cell features.   Patient is currently admitted ;Communicated to the hospitalist taking care of the patient .  Recommend surgery and oncology follow up  ==================  A. STOMACH MASS, BIOPSY:       Invasive adenocarcinoma with signet ring cell features.        Background gastric mucosa with chronic active gastritis, ulceration  and intestinal metaplasia.       Immunohistochemical stain for H. pylori will be performed on part B  and will be reported in an addendum.   B. STOMACH, RANDOM, BIOPSY:       Gastric mucosa with chronic active gastritis and extensive  intestinal metaplasia.       Immunohistochemical stain for H. pylori will be performed will be  reported in an addendum.       Negative for dysplasia or malignancy.

## 2023-07-06 ENCOUNTER — Other Ambulatory Visit: Payer: Self-pay

## 2023-07-06 DIAGNOSIS — C169 Malignant neoplasm of stomach, unspecified: Secondary | ICD-10-CM

## 2023-07-06 DIAGNOSIS — Z515 Encounter for palliative care: Secondary | ICD-10-CM | POA: Diagnosis not present

## 2023-07-06 DIAGNOSIS — C61 Malignant neoplasm of prostate: Secondary | ICD-10-CM

## 2023-07-06 DIAGNOSIS — Z7189 Other specified counseling: Secondary | ICD-10-CM | POA: Diagnosis not present

## 2023-07-06 DIAGNOSIS — K311 Adult hypertrophic pyloric stenosis: Secondary | ICD-10-CM | POA: Diagnosis not present

## 2023-07-06 DIAGNOSIS — K3189 Other diseases of stomach and duodenum: Secondary | ICD-10-CM | POA: Diagnosis not present

## 2023-07-06 DIAGNOSIS — I700 Atherosclerosis of aorta: Secondary | ICD-10-CM | POA: Diagnosis not present

## 2023-07-06 LAB — GLUCOSE, CAPILLARY
Glucose-Capillary: 101 mg/dL — ABNORMAL HIGH (ref 70–99)
Glucose-Capillary: 102 mg/dL — ABNORMAL HIGH (ref 70–99)
Glucose-Capillary: 105 mg/dL — ABNORMAL HIGH (ref 70–99)
Glucose-Capillary: 118 mg/dL — ABNORMAL HIGH (ref 70–99)
Glucose-Capillary: 124 mg/dL — ABNORMAL HIGH (ref 70–99)
Glucose-Capillary: 92 mg/dL (ref 70–99)
Glucose-Capillary: 95 mg/dL (ref 70–99)

## 2023-07-06 LAB — CBC
HCT: 31.5 % — ABNORMAL LOW (ref 39.0–52.0)
Hemoglobin: 9.8 g/dL — ABNORMAL LOW (ref 13.0–17.0)
MCH: 30.3 pg (ref 26.0–34.0)
MCHC: 31.1 g/dL (ref 30.0–36.0)
MCV: 97.5 fL (ref 80.0–100.0)
Platelets: 155 10*3/uL (ref 150–400)
RBC: 3.23 MIL/uL — ABNORMAL LOW (ref 4.22–5.81)
RDW: 15.9 % — ABNORMAL HIGH (ref 11.5–15.5)
WBC: 4 10*3/uL (ref 4.0–10.5)
nRBC: 0 % (ref 0.0–0.2)

## 2023-07-06 LAB — COMPREHENSIVE METABOLIC PANEL
ALT: 8 U/L (ref 0–44)
AST: 12 U/L — ABNORMAL LOW (ref 15–41)
Albumin: 2.5 g/dL — ABNORMAL LOW (ref 3.5–5.0)
Alkaline Phosphatase: 42 U/L (ref 38–126)
Anion gap: 8 (ref 5–15)
BUN: 10 mg/dL (ref 8–23)
CO2: 26 mmol/L (ref 22–32)
Calcium: 8.1 mg/dL — ABNORMAL LOW (ref 8.9–10.3)
Chloride: 106 mmol/L (ref 98–111)
Creatinine, Ser: 1.31 mg/dL — ABNORMAL HIGH (ref 0.61–1.24)
GFR, Estimated: 58 mL/min — ABNORMAL LOW (ref >60)
Glucose, Bld: 98 mg/dL (ref 70–99)
Potassium: 3.7 mmol/L (ref 3.5–5.1)
Sodium: 140 mmol/L (ref 135–145)
Total Bilirubin: 0.7 mg/dL (ref <1.2)
Total Protein: 5 g/dL — ABNORMAL LOW (ref 6.5–8.1)

## 2023-07-06 LAB — SURGICAL PATHOLOGY

## 2023-07-06 MED ORDER — POLYETHYLENE GLYCOL 3350 17 G PO PACK
17.0000 g | PACK | Freq: Every day | ORAL | Status: DC
  Administered 2023-07-06 – 2023-07-08 (×2): 17 g via ORAL
  Filled 2023-07-06 (×2): qty 1

## 2023-07-06 MED ORDER — BISACODYL 10 MG RE SUPP: 10.0000 mg | Freq: Every day | RECTAL | Status: DC | PRN

## 2023-07-06 MED ORDER — CHLORHEXIDINE GLUCONATE CLOTH 2 % EX PADS: 6.0000 | MEDICATED_PAD | Freq: Once | CUTANEOUS | Status: DC

## 2023-07-06 MED ORDER — SIMETHICONE 80 MG PO CHEW
80.0000 mg | CHEWABLE_TABLET | Freq: Four times a day (QID) | ORAL | Status: DC | PRN
  Administered 2023-07-06: 80 mg via ORAL
  Filled 2023-07-06: qty 1

## 2023-07-06 NOTE — Care Management Important Message (Signed)
Important Message  Patient Details  Name: [name]. MRN: [id number] Date of Birth: 19-Apr-1951   Important Message Given:  Yes - Medicare IM     Corey Harold 07/06/2023, 2:09 PM

## 2023-07-06 NOTE — Progress Notes (Signed)
Palliative: [name] is lying quietly in bed.  Overall, he appears in moderate health.  He greets me, making and somewhat keeping eye contact.  He is alert and oriented, able to make his needs known.  There is no family at bedside at this time.  We talk about [name] visit from oncologist yesterday.  He shares that his family was able to be present for this visit.  We talk about the plan.  He tells me that he has not seen the general surgeon today.  We talked about the possibility of G/J-tube for feeding.  He tells me that he threw up earlier and this is left him feeling weak.  We talked about the benefits of artificial feeding while he is having treatment for his gastric cancer.  We talked about plan for outpatient PET scan and following up with trusted oncologist, Dr. Ellin Saba.  [name] tells me that he prefers to return to his own home.  He shares that he feels that he is strong enough and would be able to make it to his follow-ups.  All questions answered, no concerns at this time.  Conference with attending, bedside nursing staff, transition of care team related to patient condition, needs, goals of care, disposition.  Plan: At this point continue full scope/full code.  Awaiting input from general surgery for artificial feeding while seeking treatment for gastric cancer.  Follow-up outpatient for PET scan/oncological treatment plan.  50 minutes Lillia Carmel, NP Palliative medicine team Team phone [phone]

## 2023-07-06 NOTE — Progress Notes (Signed)
Informed Consent for Gastrojujenostomy signed by patient and witnessed by this RN and placed in patients medical record

## 2023-07-06 NOTE — Progress Notes (Addendum)
[facility] Progress Note  Subjective: Patient seen and examined.  He is resting comfortably in bed.  He is tolerating his clear liquids without nausea and vomiting.  He denies any abdominal pain.  Objective: Vital signs in last 24 hours: Temp:  [98.2 F (36.8 C)-98.5 F (36.9 C)] 98.5 F (36.9 C) (11/12 0349) Pulse Rate:  [74-80] 74 (11/12 1200) Resp:  [16-20] 20 (11/12 1200) BP: (119-148)/(75-86) 145/75 (11/12 1200) SpO2:  [100 %] 100 % (11/12 1200) Last BM Date : 07/01/23  Intake/Output from previous day: 11/11 0701 - 11/12 0700 In: 1836.9 [P.O.:720; I.V.:1116.9] Out: 450 [Urine:450] Intake/Output this shift: Total I/O In: 0  Out: 500 [Emesis/NG output:500]  General appearance: alert, cooperative, and no distress GI: Abdomen soft, nondistended, no percussion tenderness, non tender to palpation; no rigidity, guarding, or rebound tenderness  Lab Results:  Recent Labs    07/05/23 0416 07/06/23 0421  WBC 3.8* 4.0  HGB 9.8* 9.8*  HCT 31.4* 31.5*  PLT 153 155   BMET Recent Labs    07/05/23 0416 07/06/23 0421  NA 139 140  K 3.8 3.7  CL 106 106  CO2 25 26  GLUCOSE 80 98  BUN 8 10  CREATININE 1.21 1.31*  CALCIUM 8.1* 8.1*   PT/INR No results for input(s): "LABPROT", "INR" in the last 72 hours.  Studies/Results: No results found.  Anti-infectives: Anti-infectives (From admission, onward)    None       Assessment/Plan:  Frank Barkman. is a 72 y.o. male who was admitted with a gastric outlet obstruction, and concern for gastric malignancy.     -We discussed that his final pathology has come back and it demonstrates gastric invasive adenocarcinoma with signet ring cell features -Patient was seen by Dr. Ellin Saba, who is recommending a palliative gastrojejunostomy so that the patient can continue to eat.  Patient is not considered a curative candidate secondary to mesenteric nodes/nodules per Dr. Kirtland Bouchard -The risks and benefits of  gastrojejunostomy were discussed with the patient, including but not limited to bleeding, infection, injury to surrounding structures, need for additional procedures, and leak.  After careful consideration, [name], Frank Hageman. has decided to proceed with surgery -Plan for surgery tomorrow afternoon -Fluids per primary team -Okay for clear liquids today. NPO at midnight -Will order prophylactic antibiotics -Continue to hold Eliquis -Appreciate GI and hospitalist recommendations   LOS: 5 days    [name] 07/06/2023

## 2023-07-06 NOTE — Progress Notes (Signed)
PROGRESS NOTE   [name].  ZOX:[id number] DOB: [birth year] DOA: 07/01/2023 PCP: Frank Nevins, MD   Chief Complaint  Patient presents with   Constipation   Level of care: Med-Surg  Brief Admission History:  72 y.o. male,  with medical history significant for hypertension, atrial flutter on Eliquis, diastolic heart failure, diabetes mellitus type 2, diffuse large B-cell lymphoma status post stem cell transplant 2007, iron deficiency anemia, recent diagnosis of prostate cancer, status postradiation. -Patient presents to ED secondary to complaints of early satiety, bloating, poor appetite and oral intake, belching, nausea, but no vomiting, as well he does report bloating, he denies fever, chills, he reports significant weight loss due to poor oral intake from his GI symptoms. -In ED workup significant for creatinine at 1.34 which is around her baseline, hemoglobin stable at 11.5, white blood cell count at 3.8, patient had CT abdomen pelvis done which was significant for irregular thickening throughout the gastric antrum concerning for gastric carcinoma versus gastric lymphoma, with an element of gastric outlet obstruction with surrounding lymphadenopathy, possible carcinomatosis.   Assessment and Plan:  Gastric output obstruction due to gastric malignancy Early satiety/weight loss Failure to thrive -CT abdomen pelvis with concern of gastric outlet obstruction, with malignancy and lymphadenopathy -s/p endoscopy by GI with Dr. 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  -Dr Ellin Saba  met with patient and family to discuss treatment plan on 11/11.  Pt wants to pursue chemotherapy.    History of autoimmune hemolytic anemia -Hgb  is stable   Bilateral pleural effusion -Patient is currently on room air, but this may need further workup if malignancy is confirmed if this is related to malignancy.   History of prostate cancer -Patient received radiation therapy earlier this year, per him and his wife report his PSA has been within normal limit, he is following with urology   History of Diffuse large B-cell lymphoma: Diagnosed in November 2006, treated with R-CHOP chemotherapy with incomplete response.  He is followed by Dr [name].    Atrial flutter on Eliquis, rate controlled - Will hold Eliquis pending surgery recommendations - currently not on AV blocking agents.   DM2, not on insulin -monitoring   CBG (last 3)  Recent Labs    07/06/23 0350 07/06/23 0728 07/06/23 1115  GLUCAP 101* 95 118*   HLD Resume statin when able to take oral consistently or has G tube in place   DVT prophylaxis: SCD Code Status: Full  Family Communication: discussed with wife at length 11/8 Disposition: TBD   Consultants:  GI:  Procedures:  EGD 07/02/23 Dr. Tasia [name] FINDINGS:  - Food in the esophagus.  - A large amount of food (residue) in the stomach precluding visualization on retroflexion  - Gastritis. Biopsied.  - Large circumferential fungating malignant gastric tumor at the pylorus and in the gastric antrum. Able to traversed with adult endoscope . Biopsied.  - Normal duodenal bulb and second portion of the duodenum.  - Feeding tube placement was successfully performed for decompression  Antimicrobials:    Subjective: Pt says he vomiting a large amount yesterday evening.  Not in any pain right now.  Objective: Vitals:   07/05/23 2004 07/05/23 2252 07/06/23 0349 07/06/23 1200  BP: 133/77 (!) 141/80 (!) 148/82 (!) 145/75  Pulse: 78 80 79 74  Resp: 18  18 20    Temp: 98.2 F (36.8 C)  98.5 F (36.9 C)   TempSrc:      SpO2: 100%  100% 100%  Weight:      Height:        Intake/Output Summary (Last 24 hours) at 07/06/2023 1350 Last data filed at 07/06/2023 1333 Gross per 24 hour  Intake 2076.88 ml  Output 800 ml  Net 1276.88 ml   Filed Weights   07/01/23 1427 07/01/23 2216  Weight: 93.9 kg 90.5 kg   Examination:  General exam: Appears calm and comfortable  Respiratory system: Clear to auscultation. Respiratory effort normal. Cardiovascular system: normal S1 & S2 heard. No JVD, murmurs, rubs, gallops or clicks. No pedal edema. Gastrointestinal system: Abdomen is mildly distended, soft and nontender. No organomegaly or masses felt. Normal bowel sounds heard. Central nervous system: Alert and oriented. No focal neurological deficits. Extremities: Symmetric 5 x 5 power. Skin: No rashes, lesions or ulcers. Psychiatry: Judgement and insight appear normal. Mood & affect appropriate.   Data Reviewed: I have personally reviewed following labs and imaging studies  CBC: Recent Labs  Lab 07/01/23 1451 07/02/23 0420 07/03/23 0456 07/04/23 0425 07/05/23 0416 07/06/23 0421  WBC 3.8* 3.8* 4.6 3.7* 3.8* 4.0  NEUTROABS 2.7  --   --   --   --   --   HGB 11.5* 10.1* 9.7* 9.8* 9.8* 9.8*  HCT 36.5* 31.8* 31.6* 31.9* 31.4* 31.5*  MCV 96.1 97.0 97.2 97.9 97.5 97.5  PLT 181 154 153 149* 153 155    Basic Metabolic Panel: Recent Labs  Lab 07/02/23 0420 07/03/23 0456 07/04/23 0425 07/05/23 0416 07/06/23 0421  NA 138 137 137 139 140  K 3.8 3.8 3.4* 3.8 3.7  CL 102 104 103 106 106  CO2 26 26 26 25 26   GLUCOSE 87 91 104* 80 98  BUN 11 7* 7* 8 10  CREATININE 1.21 1.22 1.25* 1.21 1.31*  CALCIUM 8.6* 8.4* 8.2* 8.1* 8.1*  MG  --  1.9  --   --   --   PHOS 2.6 2.6  --   --   --     CBG: Recent Labs  Lab 07/05/23 2005 07/06/23 0004 07/06/23 0350 07/06/23 0728 07/06/23 1115  GLUCAP 85 92 101* 95 118*   No results found for this or any  previous visit (from the past 240 hour(s)).   Radiology Studies: No results found.  Scheduled Meds:  feeding supplement  1 Container Oral TID BM   metoprolol tartrate  2.5 mg Intravenous Q6H   polyethylene glycol  17 g Oral Daily   Continuous Infusions:  dextrose 5 % and 0.9 % NaCl 50 mL/hr at 07/06/23 0323    LOS: 5 days   Time spent: 49 mins  Yeny Schmoll Laural Benes, MD How to contact the [facility] Attending or Consulting provider 7A - 7P or covering provider during after hours 7P -7A, for this patient?  Check the care team in [facility] and look for a) attending/consulting [facility] provider listed and b) the [facility] Med team listed Log into [email or web address] to find provider on call.  Locate the [facility] provider you are looking for under [facility] and page to a number that you can be directly reached. If you still have difficulty reaching the provider, please page the [facility] (Director on Call)  for the Hospitalists listed on amion for assistance.  07/06/2023, 1:50 PM

## 2023-07-06 NOTE — Progress Notes (Signed)
Mobility Specialist Progress Note:    07/06/23 1320  Mobility  Activity Refused mobility   Pt politely refused mobility d/t fatigue and nausea. All needs me.t   Lawerance Bach Mobility Specialist Please contact via SecureChat or  Rehab office at [phone]

## 2023-07-06 NOTE — Progress Notes (Addendum)
Initial Nutrition Assessment  DOCUMENTATION CODES:   Non-severe (moderate) malnutrition in context of acute illness/injury  INTERVENTION:   When GJ tube placed and ready to use, recommend: Jevity 1.5 at 25 ml/h, increase by 10 ml every 8 hours to goal rate of 65 ml/h (1560 ml per day) Prosource TF20 60 ml once daily  Provides 2420 kcal, 120 gm protein, 1186 ml free water daily.  Free water flushes 200 ml every 4 hours for a total of 2386 ml free water daily.  Monitor magnesium, potassium, and phosphorus BID for at least 3 days, MD to replete as needed, as pt is at risk for refeeding syndrome given minimal intake for several months with 11% weight loss and moderate malnutrition.  NUTRITION DIAGNOSIS:   Moderate Malnutrition related to acute illness (GOO) as evidenced by mild muscle depletion, mild fat depletion.  Ongoing   GOAL:   Patient will meet greater than or equal to 90% of their needs  Unmet on clear liquids  MONITOR:   Diet advancement  REASON FOR ASSESSMENT:   Malnutrition Screening Tool    ASSESSMENT:   72 yo male admitted with GOO with concern for gastric malignancy. PMH includes iron deficiency anemia, prostate cancer, B-cell lymphoma, DM-2, HLD, HTN, HF.  Pathology positive for invasive gastric adenocarcinoma with signet ring cell features. PET scan planned as an outpatient. Patient is currently on clear liquids with Boost Breeze TID. Intake of meals and supplements is minimal. 250 ml emesis documented 11/10. Surgery has been consulted for gastrojejunostomy vs. J-tube placement.   Labs reviewed.  CBG: 92-101-95-118  Medications reviewed. IVF: D5 NS at 50 ml/h.   Diet Order:   Diet Order             Diet clear liquid Fluid consistency: Thin  Diet effective now                   EDUCATION NEEDS:   No education needs have been identified at this time  Skin:  Skin Assessment: Reviewed RN Assessment  Last BM:  11/7  Height:   Ht  Readings from Last 1 Encounters:  07/01/23 5\' 11"  (1.803 m)    Weight:   Wt Readings from Last 1 Encounters:  07/01/23 90.5 kg    Ideal Body Weight:  78.2 kg  BMI:  Body mass index is 27.83 kg/m.  Estimated Nutritional Needs:   Kcal:  2250-2500  Protein:  120-140 gm  Fluid:  2.3-2.5 L   Gabriel Rainwater RD, LDN, CNSC Please refer to Amion for contact information.

## 2023-07-06 NOTE — Plan of Care (Signed)

## 2023-07-07 ENCOUNTER — Inpatient Hospital Stay (HOSPITAL_COMMUNITY): Payer: Medicare Other | Admitting: Certified Registered"

## 2023-07-07 ENCOUNTER — Encounter (HOSPITAL_COMMUNITY)
Admission: EM | Disposition: A | Discharge status: Skilled Nursing Facility | Payer: Self-pay | Source: Home / Self Care | Attending: Internal Medicine

## 2023-07-07 ENCOUNTER — Other Ambulatory Visit: Payer: Self-pay

## 2023-07-07 DIAGNOSIS — E782 Mixed hyperlipidemia: Secondary | ICD-10-CM | POA: Diagnosis not present

## 2023-07-07 DIAGNOSIS — C169 Malignant neoplasm of stomach, unspecified: Secondary | ICD-10-CM

## 2023-07-07 DIAGNOSIS — I5032 Chronic diastolic (congestive) heart failure: Secondary | ICD-10-CM | POA: Diagnosis not present

## 2023-07-07 DIAGNOSIS — K3189 Other diseases of stomach and duodenum: Secondary | ICD-10-CM | POA: Diagnosis not present

## 2023-07-07 DIAGNOSIS — K311 Adult hypertrophic pyloric stenosis: Secondary | ICD-10-CM | POA: Diagnosis not present

## 2023-07-07 HISTORY — PX: GASTROJEJUNOSTOMY: SHX1697

## 2023-07-07 LAB — GLUCOSE, CAPILLARY
Glucose-Capillary: 106 mg/dL — ABNORMAL HIGH (ref 70–99)
Glucose-Capillary: 107 mg/dL — ABNORMAL HIGH (ref 70–99)
Glucose-Capillary: 109 mg/dL — ABNORMAL HIGH (ref 70–99)
Glucose-Capillary: 113 mg/dL — ABNORMAL HIGH (ref 70–99)
Glucose-Capillary: 123 mg/dL — ABNORMAL HIGH (ref 70–99)
Glucose-Capillary: 73 mg/dL (ref 70–99)
Glucose-Capillary: 83 mg/dL (ref 70–99)
Glucose-Capillary: 92 mg/dL (ref 70–99)

## 2023-07-07 LAB — SURGICAL PCR SCREEN
MRSA, PCR: NEGATIVE
Staphylococcus aureus: NEGATIVE

## 2023-07-07 LAB — COMPREHENSIVE METABOLIC PANEL
ALT: 8 U/L (ref 0–44)
AST: 11 U/L — ABNORMAL LOW (ref 15–41)
Albumin: 2.5 g/dL — ABNORMAL LOW (ref 3.5–5.0)
Alkaline Phosphatase: 41 U/L (ref 38–126)
Anion gap: 4 — ABNORMAL LOW (ref 5–15)
BUN: 9 mg/dL (ref 8–23)
CO2: 25 mmol/L (ref 22–32)
Calcium: 7.9 mg/dL — ABNORMAL LOW (ref 8.9–10.3)
Chloride: 109 mmol/L (ref 98–111)
Creatinine, Ser: 1.78 mg/dL — ABNORMAL HIGH (ref 0.61–1.24)
GFR, Estimated: 40 mL/min — ABNORMAL LOW (ref >60)
Glucose, Bld: 94 mg/dL (ref 70–99)
Potassium: 3.5 mmol/L (ref 3.5–5.1)
Sodium: 138 mmol/L (ref 135–145)
Total Bilirubin: 0.6 mg/dL (ref <1.2)
Total Protein: 4.8 g/dL — ABNORMAL LOW (ref 6.5–8.1)

## 2023-07-07 LAB — CBC
HCT: 31.7 % — ABNORMAL LOW (ref 39.0–52.0)
Hemoglobin: 10 g/dL — ABNORMAL LOW (ref 13.0–17.0)
MCH: 30.5 pg (ref 26.0–34.0)
MCHC: 31.5 g/dL (ref 30.0–36.0)
MCV: 96.6 fL (ref 80.0–100.0)
Platelets: 147 10*3/uL — ABNORMAL LOW (ref 150–400)
RBC: 3.28 MIL/uL — ABNORMAL LOW (ref 4.22–5.81)
RDW: 16 % — ABNORMAL HIGH (ref 11.5–15.5)
WBC: 3.7 10*3/uL — ABNORMAL LOW (ref 4.0–10.5)
nRBC: 0 % (ref 0.0–0.2)

## 2023-07-07 LAB — MAGNESIUM: Magnesium: 1.7 mg/dL (ref 1.7–2.4)

## 2023-07-07 SURGERY — GASTROJEJUNOSTOMY
Anesthesia: General | Site: Abdomen

## 2023-07-07 MED ORDER — PHENYLEPHRINE 80 MCG/ML (10ML) SYRINGE FOR IV PUSH (FOR BLOOD PRESSURE SUPPORT)
PREFILLED_SYRINGE | INTRAVENOUS | Status: DC | PRN
Start: 2023-07-07 — End: 2023-07-07
  Administered 2023-07-07 (×3): 160 ug via INTRAVENOUS
  Administered 2023-07-07: 80 ug via INTRAVENOUS

## 2023-07-07 MED ORDER — OXYCODONE HCL 5 MG PO TABS
5.0000 mg | ORAL_TABLET | ORAL | Status: DC | PRN
  Administered 2023-07-07: 5 mg
  Filled 2023-07-07: qty 1

## 2023-07-07 MED ORDER — FENTANYL CITRATE PF 50 MCG/ML IJ SOSY
PREFILLED_SYRINGE | INTRAMUSCULAR | Status: AC
  Filled 2023-07-07: qty 1

## 2023-07-07 MED ORDER — LACTATED RINGERS IV SOLN: INTRAVENOUS | Status: DC

## 2023-07-07 MED ORDER — LACTATED RINGERS IV SOLN: INTRAVENOUS | Status: DC | PRN

## 2023-07-07 MED ORDER — DEXAMETHASONE SODIUM PHOSPHATE 10 MG/ML IJ SOLN
INTRAMUSCULAR | Status: DC | PRN
  Administered 2023-07-07: 10 mg via INTRAVENOUS

## 2023-07-07 MED ORDER — PROPOFOL 10 MG/ML IV BOLUS
INTRAVENOUS | Status: AC
  Filled 2023-07-07: qty 20

## 2023-07-07 MED ORDER — ORAL CARE MOUTH RINSE: 15.0000 mL | Freq: Once | OROMUCOSAL | Status: AC

## 2023-07-07 MED ORDER — BUPIVACAINE HCL (PF) 0.5 % IJ SOLN
INTRAMUSCULAR | Status: DC | PRN
  Administered 2023-07-07: 30 mL

## 2023-07-07 MED ORDER — LIDOCAINE HCL (CARDIAC) PF 100 MG/5ML IV SOSY
PREFILLED_SYRINGE | INTRAVENOUS | Status: DC | PRN
  Administered 2023-07-07: 60 mg via INTRAVENOUS

## 2023-07-07 MED ORDER — ACETAMINOPHEN 500 MG PO TABS
1000.0000 mg | ORAL_TABLET | Freq: Four times a day (QID) | ORAL | Status: DC
  Administered 2023-07-07 – 2023-07-12 (×16): 1000 mg
  Filled 2023-07-07 (×17): qty 2

## 2023-07-07 MED ORDER — SUCCINYLCHOLINE CHLORIDE 200 MG/10ML IV SOSY
PREFILLED_SYRINGE | INTRAVENOUS | Status: DC | PRN
  Administered 2023-07-07: 100 mg via INTRAVENOUS

## 2023-07-07 MED ORDER — DEXTROSE 50 % IV SOLN
INTRAVENOUS | Status: AC
  Filled 2023-07-07: qty 50

## 2023-07-07 MED ORDER — SUCCINYLCHOLINE CHLORIDE 200 MG/10ML IV SOSY
PREFILLED_SYRINGE | INTRAVENOUS | Status: AC
  Filled 2023-07-07: qty 10

## 2023-07-07 MED ORDER — ONDANSETRON HCL 4 MG/2ML IJ SOLN
INTRAMUSCULAR | Status: AC
  Filled 2023-07-07: qty 2

## 2023-07-07 MED ORDER — CEFAZOLIN SODIUM-DEXTROSE 2-4 GM/100ML-% IV SOLN
INTRAVENOUS | Status: AC
  Filled 2023-07-07: qty 100

## 2023-07-07 MED ORDER — ALBUMIN HUMAN 5 % IV SOLN
INTRAVENOUS | Status: AC
  Filled 2023-07-07: qty 250

## 2023-07-07 MED ORDER — TRIPLE ANTIBIOTIC 3.5-400-5000 EX OINT
TOPICAL_OINTMENT | CUTANEOUS | Status: AC
  Filled 2023-07-07: qty 2

## 2023-07-07 MED ORDER — DEXTROSE 50 % IV SOLN
25.0000 mL | Freq: Once | INTRAVENOUS | Status: AC
  Administered 2023-07-07: 25 mL via INTRAVENOUS

## 2023-07-07 MED ORDER — LIDOCAINE HCL (PF) 2 % IJ SOLN
INTRAMUSCULAR | Status: AC
  Filled 2023-07-07: qty 5

## 2023-07-07 MED ORDER — FENTANYL CITRATE (PF) 100 MCG/2ML IJ SOLN
INTRAMUSCULAR | Status: AC
  Filled 2023-07-07: qty 2

## 2023-07-07 MED ORDER — FENTANYL CITRATE PF 50 MCG/ML IJ SOSY
25.0000 ug | PREFILLED_SYRINGE | INTRAMUSCULAR | Status: DC | PRN
  Administered 2023-07-07: 50 ug via INTRAVENOUS

## 2023-07-07 MED ORDER — ROCURONIUM BROMIDE 10 MG/ML (PF) SYRINGE
PREFILLED_SYRINGE | INTRAVENOUS | Status: AC
  Filled 2023-07-07: qty 10

## 2023-07-07 MED ORDER — PHENYLEPHRINE 80 MCG/ML (10ML) SYRINGE FOR IV PUSH (FOR BLOOD PRESSURE SUPPORT)
PREFILLED_SYRINGE | INTRAVENOUS | Status: AC
Start: 2023-07-07 — End: ?
  Filled 2023-07-07: qty 10

## 2023-07-07 MED ORDER — CHLORHEXIDINE GLUCONATE 0.12 % MT SOLN
15.0000 mL | Freq: Once | OROMUCOSAL | Status: AC
  Administered 2023-07-07: 15 mL via OROMUCOSAL

## 2023-07-07 MED ORDER — OXYCODONE HCL 5 MG PO TABS: 5.0000 mg | ORAL_TABLET | Freq: Once | ORAL | Status: DC | PRN

## 2023-07-07 MED ORDER — SUGAMMADEX SODIUM 200 MG/2ML IV SOLN
INTRAVENOUS | Status: DC | PRN
  Administered 2023-07-07: 200 mg via INTRAVENOUS

## 2023-07-07 MED ORDER — MORPHINE SULFATE (PF) 2 MG/ML IV SOLN: 1.0000 mg | INTRAVENOUS | Status: DC | PRN

## 2023-07-07 MED ORDER — OXYCODONE HCL 5 MG/5ML PO SOLN: 5.0000 mg | Freq: Once | ORAL | Status: DC | PRN

## 2023-07-07 MED ORDER — SODIUM CHLORIDE 0.9 % IR SOLN
Status: DC | PRN
  Administered 2023-07-07 (×3): 1000 mL

## 2023-07-07 MED ORDER — ALBUMIN HUMAN 5 % IV SOLN: INTRAVENOUS | Status: DC | PRN

## 2023-07-07 MED ORDER — PROPOFOL 10 MG/ML IV BOLUS
INTRAVENOUS | Status: DC | PRN
Start: 2023-07-07 — End: 2023-07-07
  Administered 2023-07-07: 120 mg via INTRAVENOUS

## 2023-07-07 MED ORDER — PHENYLEPHRINE HCL-NACL 20-0.9 MG/250ML-% IV SOLN
INTRAVENOUS | Status: DC | PRN
Start: 2023-07-07 — End: 2023-07-07
  Administered 2023-07-07: 50 ug/min via INTRAVENOUS

## 2023-07-07 MED ORDER — CEFAZOLIN SODIUM-DEXTROSE 2-4 GM/100ML-% IV SOLN
2.0000 g | INTRAVENOUS | Status: AC
Start: 2023-07-07 — End: 2023-07-07
  Administered 2023-07-07: 2 g via INTRAVENOUS

## 2023-07-07 MED ORDER — POTASSIUM CHLORIDE IN NACL 20-0.9 MEQ/L-% IV SOLN
INTRAVENOUS | Status: AC
Start: 2023-07-07 — End: 2023-07-08

## 2023-07-07 MED ORDER — DEXAMETHASONE SODIUM PHOSPHATE 10 MG/ML IJ SOLN
INTRAMUSCULAR | Status: AC
  Filled 2023-07-07: qty 1

## 2023-07-07 MED ORDER — ONDANSETRON HCL 4 MG/2ML IJ SOLN
INTRAMUSCULAR | Status: DC | PRN
  Administered 2023-07-07: 4 mg via INTRAVENOUS

## 2023-07-07 MED ORDER — MAGNESIUM SULFATE IN D5W 1-5 GM/100ML-% IV SOLN: 1.0000 g | Freq: Once | INTRAVENOUS | Status: DC

## 2023-07-07 MED ORDER — PHENYLEPHRINE HCL-NACL 20-0.9 MG/250ML-% IV SOLN
INTRAVENOUS | Status: AC
Start: 2023-07-07 — End: ?
  Filled 2023-07-07: qty 250

## 2023-07-07 MED ORDER — FENTANYL CITRATE (PF) 100 MCG/2ML IJ SOLN
INTRAMUSCULAR | Status: DC | PRN
  Administered 2023-07-07 (×3): 50 ug via INTRAVENOUS

## 2023-07-07 MED ORDER — ROCURONIUM BROMIDE 10 MG/ML (PF) SYRINGE
PREFILLED_SYRINGE | INTRAVENOUS | Status: DC | PRN
  Administered 2023-07-07: 10 mg via INTRAVENOUS
  Administered 2023-07-07: 40 mg via INTRAVENOUS
  Administered 2023-07-07: 10 mg via INTRAVENOUS
  Administered 2023-07-07: 40 mg via INTRAVENOUS

## 2023-07-07 MED ORDER — BUPIVACAINE HCL (PF) 0.5 % IJ SOLN
INTRAMUSCULAR | Status: AC
Start: 2023-07-07 — End: ?
  Filled 2023-07-07: qty 30

## 2023-07-07 SURGICAL SUPPLY — 49 items
APPLICATOR COTTON TIP 6 STRL (MISCELLANEOUS) IMPLANT
APPLICATOR COTTON TIP 6IN STRL (MISCELLANEOUS) ×1
CHLORAPREP W/TINT 26 (MISCELLANEOUS) ×1 IMPLANT
CLOTH BEACON ORANGE TIMEOUT ST (SAFETY) ×1 IMPLANT
COVER LIGHT HANDLE STERIS (MISCELLANEOUS) ×2 IMPLANT
DRAPE WARM FLUID 44X44 (DRAPES) ×1 IMPLANT
DRSG OPSITE POSTOP 4X10 (GAUZE/BANDAGES/DRESSINGS) ×1 IMPLANT
DRSG OPSITE POSTOP 4X8 (GAUZE/BANDAGES/DRESSINGS) IMPLANT
ELECT REM PT RETURN 9FT ADLT (ELECTROSURGICAL) ×1
ELECTRODE REM PT RTRN 9FT ADLT (ELECTROSURGICAL) ×1 IMPLANT
GLOVE BIOGEL PI IND STRL 6.5 (GLOVE) ×1 IMPLANT
GLOVE BIOGEL PI IND STRL 7.0 (GLOVE) ×2 IMPLANT
GLOVE BIOGEL PI IND STRL 8 (GLOVE) IMPLANT
GLOVE ECLIPSE 6.5 STRL STRAW (GLOVE) ×1 IMPLANT
GLOVE SURG SS PI 7.5 STRL IVOR (GLOVE) ×2 IMPLANT
GLOVE SURG SS PI 8.0 STRL IVOR (GLOVE) IMPLANT
GOWN STRL REUS W/TWL LRG LVL3 (GOWN DISPOSABLE) ×3 IMPLANT
GOWN STRL REUS W/TWL XL LVL3 (GOWN DISPOSABLE) IMPLANT
HANDLE SUCTION POOLE (INSTRUMENTS) IMPLANT
INST SET MAJOR GENERAL (KITS) ×1 IMPLANT
KIT TURNOVER KIT A (KITS) ×1 IMPLANT
LIGASURE IMPACT 36 18CM CVD LR (INSTRUMENTS) ×1 IMPLANT
MANIFOLD NEPTUNE II (INSTRUMENTS) ×1 IMPLANT
NDL HYPO 18GX1.5 BLUNT FILL (NEEDLE) ×1 IMPLANT
NEEDLE HYPO 18GX1.5 BLUNT FILL (NEEDLE) ×1
NEEDLE HYPO 22GX1.5 SAFETY (NEEDLE) ×1 IMPLANT
NS IRRIG 1000ML POUR BTL (IV SOLUTION) ×2 IMPLANT
PACK MAJOR ABDOMINAL (CUSTOM PROCEDURE TRAY) ×1 IMPLANT
PAD ARMBOARD 7.5X6 YLW CONV (MISCELLANEOUS) ×1 IMPLANT
PENCIL SMOKE EVACUATOR (MISCELLANEOUS) ×1 IMPLANT
POSITIONER HEAD 8X9X4 ADT (SOFTGOODS) ×1 IMPLANT
RELOAD PROXIMATE 75MM BLUE (ENDOMECHANICALS) ×1
RELOAD STAPLE 75 3.8 BLU REG (ENDOMECHANICALS) ×3 IMPLANT
RETRACTOR WND ALEXIS 18 MED (MISCELLANEOUS) IMPLANT
RETRACTOR WND ALEXIS-O 25 LRG (MISCELLANEOUS) IMPLANT
RETRACTOR WOUND ALXS 18CM MED (MISCELLANEOUS) IMPLANT
RTRCTR WOUND ALEXIS 18CM MED (MISCELLANEOUS)
RTRCTR WOUND ALEXIS O 18CM MED (MISCELLANEOUS) ×1
RTRCTR WOUND ALEXIS O 25CM LRG (MISCELLANEOUS)
SET BASIN LINEN APH (SET/KITS/TRAYS/PACK) ×1 IMPLANT
SPONGE T-LAP 18X18 ~~LOC~~+RFID (SPONGE) ×1 IMPLANT
STAPLER GUN LINEAR PROX 60 (STAPLE) ×2 IMPLANT
STAPLER PROXIMATE 75MM BLUE (STAPLE) ×1 IMPLANT
STAPLER VISISTAT (STAPLE) IMPLANT
SUCTION POOLE HANDLE (INSTRUMENTS) ×2
SUT NOVA NAB GS-26 0 60 (SUTURE) ×2 IMPLANT
SUT SILK 3 0 SH CR/8 (SUTURE) ×1 IMPLANT
SUT VICRYL 0 UR6 27IN ABS (SUTURE) IMPLANT
TRAY FOLEY SLVR 16FR LF STAT (SET/KITS/TRAYS/PACK) ×1 IMPLANT

## 2023-07-07 NOTE — Anesthesia Procedure Notes (Signed)
Procedure Name: Intubation Date/Time: 07/07/2023 11:44 AM  Performed by: Oletha Cruel, CRNAPre-anesthesia Checklist: Patient identified, Emergency Drugs available, Suction available and Patient being monitored Patient Re-evaluated:Patient Re-evaluated prior to induction Oxygen Delivery Method: Circle system utilized Preoxygenation: Pre-oxygenation with 100% oxygen Induction Type: IV induction and Rapid sequence Laryngoscope Size: Mac and 4 Grade View: Grade II Tube type: Oral Tube size: 7.0 mm Number of attempts: 1 Airway Equipment and Method: Stylet Placement Confirmation: ETT inserted through vocal cords under direct vision, positive ETCO2, breath sounds checked- equal and bilateral and CO2 detector Secured at: 22 cm Tube secured with: Tape Dental Injury: Teeth and Oropharynx as per pre-operative assessment  Comments: Preoxygenated prior to induction. RSI. Atraumatic intubation. Teeth and lips remain in preoperative condition.

## 2023-07-07 NOTE — Anesthesia Postprocedure Evaluation (Signed)
Anesthesia Post Note  Patient: Prentice Ohman.  Procedure(s) Performed: GASTROJEJUNOSTOMY (Abdomen)  Patient location during evaluation: PACU Anesthesia Type: General Level of consciousness: awake and alert Pain management: pain level controlled Vital Signs Assessment: post-procedure vital signs reviewed and stable Respiratory status: spontaneous breathing, nonlabored ventilation, respiratory function stable and patient connected to nasal cannula oxygen Cardiovascular status: blood pressure returned to baseline and stable Postop Assessment: no apparent nausea or vomiting Anesthetic complications: no   There were no known notable events for this encounter.   Last Vitals:  Vitals:   07/07/23 1445 07/07/23 1500  BP:  (!) 149/84  Pulse: 78 78  Resp: 17 16  Temp: (!) 36.3 C 36.6 C  SpO2: 99% 100%    Last Pain:  Vitals:   07/07/23 1000  TempSrc:   PainSc: 0-No pain                 Sorayah Schrodt L Anarie Kalish

## 2023-07-07 NOTE — Progress Notes (Signed)
[facility] Progress Note  Day of Surgery  Subjective: Patient seen and examined.  He is sitting comfortably in chair at the side of bed.  He has no acute complaints at this time.  He has no new questions about surgery.  Objective: Vital signs in last 24 hours: Temp:  [98.5 F (36.9 C)] 98.5 F (36.9 C) (11/13 0354) Pulse Rate:  [73-78] 78 (11/13 0354) Resp:  [18-20] 18 (11/13 0354) BP: (128-145)/(75-84) 130/76 (11/13 0354) SpO2:  [100 %] 100 % (11/13 0354) Last BM Date : 06/30/23  Intake/Output from previous day: 11/12 0701 - 11/13 0700 In: 480 [P.O.:480] Out: 700 [Urine:200; Emesis/NG output:500] Intake/Output this shift: No intake/output data recorded.  General appearance: alert, cooperative, and no distress GI: Abdomen soft, nondistended, no percussion tenderness, nontender to palpation; no rigidity, guarding, rebound tenderness  Lab Results:  Recent Labs    07/06/23 0421 07/07/23 0418  WBC 4.0 3.7*  HGB 9.8* 10.0*  HCT 31.5* 31.7*  PLT 155 147*   BMET Recent Labs    07/06/23 0421 07/07/23 0418  NA 140 138  K 3.7 3.5  CL 106 109  CO2 26 25  GLUCOSE 98 94  BUN 10 9  CREATININE 1.31* 1.78*  CALCIUM 8.1* 7.9*   PT/INR No results for input(s): "LABPROT", "INR" in the last 72 hours.  Studies/Results: No results found.  Anti-infectives: Anti-infectives (From admission, onward)    Start     Dose/Rate Route Frequency Ordered Stop   07/07/23 1045  ceFAZolin (ANCEF) IVPB 2g/100 mL premix        2 g 200 mL/hr over 30 Minutes Intravenous On call to O.R. 07/07/23 1032 07/08/23 0559   07/07/23 1035  ceFAZolin (ANCEF) 2-4 GM/100ML-% IVPB       Note to Pharmacy: Lillia Mountain: cabinet override      07/07/23 1035 07/07/23 2244       Assessment/Plan:  Patient is a 72 year old male who was admitted with gastric outlet obstruction and concern for gastric malignancy.  -Plan for palliative gastrojejunostomy today -NPO -Fluids per  primary team -Continue to hold Eliquis, will likely hold Eliquis for 2 days postoperatively -Prophylactic antibiotics ordered -Patient will have NG tube in place postoperatively which will remain in place for at least 2 days with plan for an upper GI on Friday versus over the weekend -Further recommendations to follow surgery -Appreciate hospitalist and oncology recommendations   LOS: 6 days    Nalin Mazzocco A Wylee Dorantes 07/07/2023

## 2023-07-07 NOTE — Anesthesia Preprocedure Evaluation (Addendum)
Anesthesia Evaluation    Airway Mallampati: II  TM Distance: >3 FB Neck ROM: Full    Dental  (+) Dental Advisory Given, Missing, Edentulous Upper, Poor Dentition All but 2 teeth missing lower and those look terrible and are in the front:   Pulmonary asthma , pneumonia, former smoker   Pulmonary exam normal breath sounds clear to auscultation       Cardiovascular hypertension, +CHF  Normal cardiovascular exam Rhythm:Regular Rate:Normal  Diastolic heart failure   Neuro/Psych    GI/Hepatic   Endo/Other  diabetes, Type 2    Renal/GU      Musculoskeletal   Abdominal Normal abdominal exam  (+)   Peds  Hematology  (+) Blood dyscrasia, anemia   Anesthesia Other Findings Lymphoma  Reproductive/Obstetrics                             Anesthesia Physical Anesthesia Plan  ASA: 3  Anesthesia Plan: General   Post-op Pain Management:    Induction: Intravenous  PONV Risk Score and Plan: Ondansetron and Dexamethasone  Airway Management Planned: Oral ETT  Additional Equipment: None  Intra-op Plan:   Post-operative Plan: Extubation in OR  Informed Consent: I have reviewed the patients History and Physical, chart, labs and discussed the procedure including the risks, benefits and alternatives for the proposed anesthesia with the patient or authorized representative who has indicated his/her understanding and acceptance.     Dental advisory given  Plan Discussed with: CRNA  Anesthesia Plan Comments:        Anesthesia Quick Evaluation

## 2023-07-07 NOTE — Progress Notes (Addendum)
PROGRESS NOTE  [name]. WUJ:[id number] DOB: [birth year] DOA: 07/01/2023 PCP: [name], MD  Brief History:  72 y.o. male,  with medical history significant for hypertension, atrial flutter on Eliquis, diastolic heart failure, diabetes mellitus type 2, diffuse large B-cell lymphoma status post stem cell transplant 2007, iron deficiency anemia, recent diagnosis of prostate cancer, status postradiation. -Patient presents to ED secondary to complaints of early satiety, bloating, poor appetite and oral intake, belching, nausea, but no vomiting, as well he does report bloating, he denies fever, chills, he reports significant weight loss due to poor oral intake from his GI symptoms. -In ED workup significant for creatinine at 1.34 which is around her baseline, hemoglobin stable at 11.5, white blood cell count at 3.8, patient had CT abdomen pelvis done which was significant for irregular thickening throughout the gastric antrum concerning for gastric carcinoma versus gastric lymphoma, with an element of gastric outlet obstruction with surrounding lymphadenopathy, possible carcinomatosis.   Assessment/Plan: Gastric output obstruction due to gastric malignancy Early satiety/weight loss Failure to thrive -CT abdomen pelvis with concern of gastric outlet obstruction, with malignancy and lymphadenopathy -s/p endoscopy by GI with Dr. 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  -Dr Ellin Saba met with patient and family to discuss treatment plan on 11/11.  Pt wants to pursue chemotherapy.   -07/07/23--plan for gastrojejunostomy   History of autoimmune hemolytic anemia -Hgb  is stable   Bilateral pleural effusion -Patient is currently on room air.   History of prostate cancer -Patient received radiation therapy earlier this year, per him and his wife report his PSA has been within normal limit, he is following with urology   History of Diffuse large B-cell lymphoma: Diagnosed in November 2006, treated with R-CHOP chemotherapy with incomplete response.  He is followed by Dr [name].    Atrial flutter on Eliquis, rate controlled - Will hold Eliquis pending surgery recommendations - PTA not on AV blocking agents. - continue IV lopressor for now that was started on 11/9   DM2, not on insulin -no longer on therapy   02/12/20 A1C 8.2 -06/16/23 A1C 4.6   HLD Resume statin when able to take oral consistently or has G tube in place   CKD 3a  -baseline creatinine 1.2-1.4  Essential HTN -previously on metoprolol        Family Communication:   spouse updated 11/13  Consultants:  general surgery  Code Status:  FULL   DVT Prophylaxis:  apixaban on hold   Procedures: As Listed in Progress Note Above  Antibiotics: None       Subjective: Patient denies fevers, chills, headache, chest pain, dyspnea, nausea, vomiting, diarrhea, abdominal pain, dysuria, hematuria, hematochezia, and melena.   Objective: Vitals:   07/06/23 1200 07/06/23 1752 07/06/23 1952 07/07/23 0354  BP: (!) 145/75 128/77 129/84 130/76  Pulse: 74 77 73 78  Resp: 20  18 18   Temp:   98.5 F (36.9 C) 98.5 F (36.9 C)  TempSrc:      SpO2: 100%  100% 100%  Weight:      Height:        Intake/Output  Summary (Last 24 hours) at 07/07/2023 0931 Last data filed at 07/07/2023 0300 Gross per 24 hour  Intake 480 ml  Output 200 ml  Net 280 ml   Weight change:  Exam:  General:  Pt is alert, follows commands appropriately, not in acute distress HEENT: No icterus, No thrush, No neck  mass, [place]/AT Cardiovascular: RRR, S1/S2, no rubs, no gallops Respiratory: fine bibasilar crackles. No wheeze Abdomen: Soft/+BS, non tender, non distended, no guarding Extremities: No edema, No lymphangitis, No petechiae, No rashes, no synovitis   Data Reviewed: I have personally reviewed following labs and imaging studies Basic Metabolic Panel: Recent Labs  Lab 07/02/23 0420 07/03/23 0456 07/04/23 0425 07/05/23 0416 07/06/23 0421 07/07/23 0418  NA 138 137 137 139 140 138  K 3.8 3.8 3.4* 3.8 3.7 3.5  CL 102 104 103 106 106 109  CO2 26 26 26 25 26 25   GLUCOSE 87 91 104* 80 98 94  BUN 11 7* 7* 8 10 9   CREATININE 1.21 1.22 1.25* 1.21 1.31* 1.78*  CALCIUM 8.6* 8.4* 8.2* 8.1* 8.1* 7.9*  MG  --  1.9  --   --   --  1.7  PHOS 2.6 2.6  --   --   --   --    Liver Function Tests: Recent Labs  Lab 07/03/23 0456 07/04/23 0425 07/05/23 0416 07/06/23 0421 07/07/23 0418  AST 13* 12* 13* 12* 11*  ALT 10 9 7 8 8   ALKPHOS 45 44 40 42 41  BILITOT 0.7 0.8 1.0 0.7 0.6  PROT 5.0* 4.9* 5.0* 5.0* 4.8*  ALBUMIN 2.7* 2.7* 2.6* 2.5* 2.5*   Recent Labs  Lab 07/01/23 1456  LIPASE 34   No results for input(s): "AMMONIA" in the last 168 hours. Coagulation Profile: No results for input(s): "INR", "PROTIME" in the last 168 hours. CBC: Recent Labs  Lab 07/01/23 1451 07/02/23 0420 07/03/23 0456 07/04/23 0425 07/05/23 0416 07/06/23 0421 07/07/23 0418  WBC 3.8*   < > 4.6 3.7* 3.8* 4.0 3.7*  NEUTROABS 2.7  --   --   --   --   --   --   HGB 11.5*   < > 9.7* 9.8* 9.8* 9.8* 10.0*  HCT 36.5*   < > 31.6* 31.9* 31.4* 31.5* 31.7*  MCV 96.1   < > 97.2 97.9 97.5 97.5 96.6  PLT 181   < > 153 149* 153 155 147*   < > = values in this interval not displayed.   Cardiac Enzymes: No results for input(s): "CKTOTAL", "CKMB", "CKMBINDEX", "TROPONINI" in the last 168 hours. BNP: Invalid input(s): "POCBNP" CBG: Recent Labs  Lab 07/06/23 1635 07/06/23 1953 07/06/23 2338 07/07/23 0355 07/07/23 0734   GLUCAP 102* 105* 124* 107* 92   HbA1C: No results for input(s): "HGBA1C" in the last 72 hours. Urine analysis:    Component Value Date/Time   COLORURINE YELLOW 07/01/2023 1920   APPEARANCEUR HAZY (A) 07/01/2023 1920   APPEARANCEUR Clear 05/03/2023 1056   LABSPEC 1.010 07/01/2023 1920   PHURINE 5.0 07/01/2023 1920   GLUCOSEU NEGATIVE 07/01/2023 1920   HGBUR SMALL (A) 07/01/2023 1920   BILIRUBINUR NEGATIVE 07/01/2023 1920   BILIRUBINUR Negative 05/03/2023 1056   KETONESUR 20 (A) 07/01/2023 1920   PROTEINUR 100 (A) 07/01/2023 1920   NITRITE NEGATIVE 07/01/2023 1920   LEUKOCYTESUR NEGATIVE 07/01/2023 1920   Sepsis Labs: @LABRCNTIP (procalcitonin:4,lacticidven:4) ) Recent Results (from the past 240 hour(s))  Surgical pcr screen     Status: None   Collection Time:  07/06/23  8:17 PM   Specimen: Nasal Mucosa; Nasal Swab  Result Value Ref Range Status   MRSA, PCR NEGATIVE NEGATIVE Final   Staphylococcus aureus NEGATIVE NEGATIVE Final    Comment: (NOTE) The Xpert SA Assay (FDA approved for NASAL specimens in patients 42 years of age and older), is one component of a comprehensive surveillance program. It is not intended to diagnose infection nor to guide or monitor treatment. Performed at [facility], [address] [place], [place] [address]      Scheduled Meds:  Chlorhexidine Gluconate Cloth  6 each Topical Once   And   Chlorhexidine Gluconate Cloth  6 each Topical Once   feeding supplement  1 Container Oral TID BM   metoprolol tartrate  2.5 mg Intravenous Q6H   polyethylene glycol  17 g Oral Daily   Continuous Infusions:  Procedures/Studies: CT ABDOMEN PELVIS W CONTRAST  Result Date: 07/01/2023 CLINICAL DATA:  Abnormal bowel movements for 1 month, nausea, unintentional weight loss, prior history of lymphoma EXAM: CT ABDOMEN AND PELVIS WITH CONTRAST TECHNIQUE: Multidetector CT imaging of the abdomen and pelvis was performed using the standard protocol following bolus  administration of intravenous contrast. RADIATION DOSE REDUCTION: This exam was performed according to the departmental dose-optimization program which includes automated exposure control, adjustment of the mA and/or kV according to patient size and/or use of iterative reconstruction technique. CONTRAST:  OMNIPAQUE IOHEXOL 300 MG/ML  SOLN COMPARISON:  08/04/2019 FINDINGS: Lower chest: There are small bilateral pleural effusions. New bilateral pleural calcifications could reflect sequela of prior pleurodesis or trauma. No acute airspace disease. Chronic scarring at the right lung base. Hepatobiliary: No focal liver abnormality is seen. No gallstones, gallbladder wall thickening, or biliary dilatation. Pancreas: Unremarkable. No pancreatic ductal dilatation or surrounding inflammatory changes. Spleen: Normal in size without focal abnormality. Adrenals/Urinary Tract: Adrenal glands are unremarkable. Kidneys are normal, without renal calculi, focal lesion, or hydronephrosis. Bladder is unremarkable. Stomach/Bowel: There is irregular wall thickening of the gastric antrum and pylorus, measuring up to 19 mm in maximal thickness. This likely results in an element of gastric outlet obstruction, with distension of the proximal stomach. Differential would include gastric lymphoma versus adenocarcinoma. Endoscopy is recommended for further evaluation. No other signs of bowel obstruction or ileus. Mild fecal retention throughout the colon. Vascular/Lymphatic: Numerous subcentimeter lymph nodes surround the gastric antral wall thickening, measuring up to 9 mm in short axis reference image 37/2. Additionally, there are numerous mesenteric nodules throughout the right upper quadrant which could reflect additional mesenteric adenopathy versus peritoneal carcinomatosis. Largest area measures 2.4 x 2.9 cm reference image 39/2. There is diffuse atherosclerosis of the aorta and its branches. Incidental 1 cm aneurysm extending  inferiorly from the main right renal artery, reference image 58/4, not significantly changed. Reproductive: Prostate is unremarkable. Fiduciary markers are seen within the prostate. Other: Trace free fluid within the lower pelvis. No free intraperitoneal gas. No abdominal wall hernia. Musculoskeletal: There are no acute or destructive bony abnormalities. Postsurgical changes are again noted within the midthoracic spine. Reconstructed images demonstrate no additional findings. IMPRESSION: 1. New irregular mural thickening throughout the gastric antrum, concerning for gastric carcinoma versus gastric lymphoma. Endoscopy is recommended for further evaluation. The mural thickening likely results in an element of gastric outlet obstruction, with distension of the proximal stomach. 2. Numerous subcentimeter lymph nodes surrounding the thickened gastric antrum, as well as numerous mesenteric nodules throughout the right upper quadrant, consistent with lymphadenopathy and carcinomatosis. 3. Small bilateral pleural effusions, with  new pleural calcifications bilaterally likely sequela of interval pleurodesis or trauma. 4. 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 5.  Aortic Atherosclerosis (ICD10-I70.0). Electronically Signed   By: Sharlet Salina M.D.   On: 07/01/2023 20:12    Catarina Hartshorn, DO  [facility]  If 7PM-7AM, please contact night-coverage [email or web address] Password [email or web address] 07/07/2023, 9:31 AM   LOS: 6 days

## 2023-07-07 NOTE — Progress Notes (Signed)
[facility]  Called to speak with the patient's wife to update her on the plans for surgery.  Explained that our plan is to perform a gastrojejunostomy to bypass the mass in her husband's stomach.  We are not planning to do any excision of the cancer at this time.  He will need to undergo further treatment (chemotherapy and radiation) per oncology prior to any surgical resections of his cancer.  Plan for surgery tomorrow afternoon at 1 PM.  All of her questions were answered to her expressed satisfaction.  Theophilus Kinds, DO [facility] [address] [place], [place] [address] [phone] (office)

## 2023-07-07 NOTE — Plan of Care (Signed)
  Problem: Education: Goal: Knowledge of General Education information will improve Description Including pain rating scale, medication(s)/side effects and non-pharmacologic comfort measures Outcome: Progressing   Problem: Health Behavior/Discharge Planning: Goal: Ability to manage health-related needs will improve Outcome: Progressing   

## 2023-07-07 NOTE — Progress Notes (Signed)
[facility]  Spoke with the patient's wife on the phone.  I explained that he tolerated the procedure without difficulty.  He has an NG tube in place, which will stay in place until at least Friday or Saturday when he undergoes an upper GI study to evaluate for a leak.  I further explained that I noted 1 area that could have been a metastatic deposit in the mesentery of the small bowel, and I removed this area and sent to pathology for evaluation.  He will be in the hospital until we are able to remove the NG tube and verify that he can tolerate a diet.  All questions were answered to her expressed satisfaction.  Plan: -Return to bed on floor -Ng to LIS -NPO -PRN pain control and antiemetics -IV fluids per primary team -Appreciate hospitalist recommendations  Theophilus Kinds, DO [facility] [address] [place], [place] [address] [phone] (office)

## 2023-07-07 NOTE — Progress Notes (Signed)
Patient returned from procedure NG tube intact to Right Nare , hooked to low intermittent suction, surgical incision midline, honeycomb dressing clean, dry intact. No pain at this time  07/07/23 1500  Vitals  Temp 97.8 F (36.6 C)  BP (!) 149/84  MAP (mmHg) 108  Pulse Rate 78  Resp 16  Level of Consciousness  Level of Consciousness Alert  MEWS COLOR  MEWS Score Color Green  Oxygen Therapy  SpO2 100 %  O2 Device Room Air  MEWS Score  MEWS Temp 0  MEWS Systolic 0  MEWS Pulse 0  MEWS RR 0  MEWS LOC 0  MEWS Score 0

## 2023-07-07 NOTE — Progress Notes (Signed)
Palliative: Chart review completed.  Mr. [name] was off the floor for most of the day for GJ tube placement.   PMT to follow.  No charge Lillia Carmel, NP Palliative medicine team Team phone [phone]

## 2023-07-07 NOTE — Op Note (Addendum)
[facility] Operative Note  07/07/23  Preoperative Diagnosis: Gastric adenocarcinoma, gastric outlet obstruction   Postoperative Diagnosis: Same   Procedure(s) Performed: Gastrojejunostomy   Surgeon: Theophilus Kinds, DO    Assistants: Berneda Rose, RNFA; Lelon Frohlich, RNFA   Anesthesia: General endotracheal   Anesthesiologist: Ronelle Nigh, MD    Specimens: Mesenteric deposit   Estimated Blood Loss: Minimal   Blood Replacement: None    Complications: None   Wound Class: Contaminated   Operative Indications: Patient is a 72 year old male who was admitted with concern for gastric outlet obstruction and a possible gastric mass.  He underwent an EGD which demonstrated a circumferential gastric mass in the antrum of the stomach, and pathology was consistent with gastric adenocarcinoma.  After discussions with oncology, decision was made to proceed with a palliative gastrojejunostomy so that the patient could continue to eat while receiving treatment.  Patient is agreeable to surgery.  All risks and benefits of performing this procedure were discussed with the patient including pain, infection, bleeding, damage to the surrounding structures, need for more procedures or surgery, and anastomotic leak. The patient voiced understanding of the procedure, all questions were sought and answered, and consent was obtained.  Findings: -Mass involving the gastric antrum with nodularity along the serosa  -Small bowel mesenteric deposit, removed and sent to pathology -No palpable nodularity of the liver and no peritoneal deposits noted on inspection of the abdomen -Distended stomach -Gastrojejunostomy in place at completion of procedure   Procedure: The patient was taken to the operating room and placed supine. General endotracheal anesthesia was induced. Intravenous antibiotics were administered per protocol.  An nasogastric tube positioned to decompress the stomach.   The abdomen was prepared and draped in the usual sterile fashion.  A time-out was completed verifying correct patient, procedure, site, positioning, and implant(s) and/or special equipment prior to beginning this procedure  A vertical midline incision was made from just below the xiphoid to above the umbilicus.  This was deepened through the subcutaneous tissues, and hemostasis was achieved with electrocautery.  The linea alba was identified, grasped, elevated, and incised, and the peritoneal cavity was entered.  Care was taken not to injure the abdominal contents below.  The abdomen was explored.  The patient was noted to have a mass involving the antrum of the stomach with nodularity of the overlying serosa.  There were no palpable nodules on the liver, and no peritoneal deposits visualized.  When running the small bowel, a small mesenteric deposit versus scar was noted.  This area was removed and sent to pathology for evaluation.  The small bowel was run from the ligament of Treitz down to the terminal ileum to verify the appropriate location for the gastrojejunostomy.  A loop of jejunum about 20 to 30 cm distal to the ligament of Treitz was brought adjacent to the greater curvature of the stomach and noted to lie without tension.  The jejunum and gastric wall were approximated with 2 stay sutures of 3-0 silk.  A gastrotomy and a jejunostomy were made, and a linear cutting stapler was inserted, closed, and fired.  The staple line was inspected for hemostasis.  Pool sucker was inserted into the gastric side to remove any retained particles of food.  The enterotomy was then closed in a 2 layer suture closure of 3-0 V-loc with full-thickness bites, and 3-0 silk in a Lembert fashion.  Two 3-0 silk crotch stitches were placed.  The gastrojejunostomy was noted to be widely patent.  The  abdomen was then irrigated with warm saline until the fluid returned clear.  NG tube was verified to be in appropriate position  within the stomach at the completion of the case.  Hemostasis was noted.  The fascia was then closed with 0 PDS from the superior and inferior aspects of the incision.  The incision was localized with Marcaine.  Hemostasis was noted again.  The incision was closed with skin staples and dressed with a honeycomb dressing.  Final inspection revealed acceptable hemostasis. All counts were correct at the end of the case. The patient was awakened from anesthesia and extubated without complication.  The patient went to the PACU in stable condition.   Theophilus Kinds, DO  [facility] [address] [place], [place] [address] [phone] (office)

## 2023-07-07 NOTE — Transfer of Care (Signed)
Immediate Anesthesia Transfer of Care Note  Patient: [name].  Procedure(s) Performed: GASTROJEJUNOSTOMY (Abdomen)  Patient Location: PACU  Anesthesia Type:General  Level of Consciousness: drowsy and patient cooperative  Airway & Oxygen Therapy: Patient Spontanous Breathing and Patient connected to nasal cannula oxygen  Post-op Assessment: Report given to RN and Post -op Vital signs reviewed and stable  Post vital signs: Reviewed and stable   Last Vitals:  Vitals Value Taken Time  BP 123/82 07/07/23 1345  Temp 36.4 C 07/07/23 1345  Pulse 76 07/07/23 1349  Resp 12 07/07/23 1349  SpO2 99 % 07/07/23 1349  Vitals shown include unfiled device data.  Last Pain:  Vitals:   07/07/23 1000  TempSrc:   PainSc: 0-No pain      Patients Stated Pain Goal: 5 (07/02/23 1200)  Complications: No notable events documented.

## 2023-07-08 ENCOUNTER — Encounter (HOSPITAL_COMMUNITY): Payer: Self-pay | Admitting: Gastroenterology

## 2023-07-08 DIAGNOSIS — N1831 Chronic kidney disease, stage 3a: Secondary | ICD-10-CM | POA: Diagnosis not present

## 2023-07-08 DIAGNOSIS — N179 Acute kidney failure, unspecified: Secondary | ICD-10-CM | POA: Diagnosis not present

## 2023-07-08 DIAGNOSIS — K311 Adult hypertrophic pyloric stenosis: Secondary | ICD-10-CM | POA: Diagnosis not present

## 2023-07-08 DIAGNOSIS — C169 Malignant neoplasm of stomach, unspecified: Secondary | ICD-10-CM | POA: Diagnosis not present

## 2023-07-08 LAB — CBC
HCT: 34.8 % — ABNORMAL LOW (ref 39.0–52.0)
Hemoglobin: 10.6 g/dL — ABNORMAL LOW (ref 13.0–17.0)
MCH: 29.9 pg (ref 26.0–34.0)
MCHC: 30.5 g/dL (ref 30.0–36.0)
MCV: 98 fL (ref 80.0–100.0)
Platelets: 155 10*3/uL (ref 150–400)
RBC: 3.55 MIL/uL — ABNORMAL LOW (ref 4.22–5.81)
RDW: 15.9 % — ABNORMAL HIGH (ref 11.5–15.5)
WBC: 8 10*3/uL (ref 4.0–10.5)
nRBC: 0 % (ref 0.0–0.2)

## 2023-07-08 LAB — GLUCOSE, CAPILLARY
Glucose-Capillary: 106 mg/dL — ABNORMAL HIGH (ref 70–99)
Glucose-Capillary: 107 mg/dL — ABNORMAL HIGH (ref 70–99)
Glucose-Capillary: 107 mg/dL — ABNORMAL HIGH (ref 70–99)
Glucose-Capillary: 82 mg/dL (ref 70–99)
Glucose-Capillary: 85 mg/dL (ref 70–99)

## 2023-07-08 LAB — MAGNESIUM: Magnesium: 1.7 mg/dL (ref 1.7–2.4)

## 2023-07-08 LAB — BASIC METABOLIC PANEL
Anion gap: 11 (ref 5–15)
BUN: 12 mg/dL (ref 8–23)
CO2: 23 mmol/L (ref 22–32)
Calcium: 8.1 mg/dL — ABNORMAL LOW (ref 8.9–10.3)
Chloride: 106 mmol/L (ref 98–111)
Creatinine, Ser: 1.96 mg/dL — ABNORMAL HIGH (ref 0.61–1.24)
GFR, Estimated: 36 mL/min — ABNORMAL LOW (ref >60)
Glucose, Bld: 109 mg/dL — ABNORMAL HIGH (ref 70–99)
Potassium: 4.4 mmol/L (ref 3.5–5.1)
Sodium: 140 mmol/L (ref 135–145)

## 2023-07-08 MED ORDER — SIMETHICONE 80 MG PO CHEW: 80.0000 mg | CHEWABLE_TABLET | Freq: Four times a day (QID) | ORAL | Status: DC | PRN

## 2023-07-08 MED ORDER — SODIUM CHLORIDE 0.9 % IV SOLN: INTRAVENOUS | Status: DC

## 2023-07-08 MED ORDER — POLYETHYLENE GLYCOL 3350 17 G PO PACK
17.0000 g | PACK | Freq: Every day | ORAL | Status: DC
  Administered 2023-07-09 – 2023-07-11 (×3): 17 g
  Filled 2023-07-08 (×4): qty 1

## 2023-07-08 NOTE — Progress Notes (Signed)
PROGRESS NOTE  [name]. LKG:[id number] DOB: [birth year] DOA: 07/01/2023 PCP: [name], MD  Brief History:  72 y.o. male,  with medical history significant for hypertension, atrial flutter on Eliquis, diastolic heart failure, diabetes mellitus type 2, diffuse large B-cell lymphoma status post stem cell transplant 2007, iron deficiency anemia, recent diagnosis of prostate cancer, status postradiation. -Patient presents to ED secondary to complaints of early satiety, bloating, poor appetite and oral intake, belching, nausea, but no vomiting, as well he does report bloating, he denies fever, chills, he reports significant weight loss due to poor oral intake from his GI symptoms. -In ED workup significant for creatinine at 1.34 which is around her baseline, hemoglobin stable at 11.5, white blood cell count at 3.8, patient had CT abdomen pelvis done which was significant for irregular thickening throughout the gastric antrum concerning for gastric carcinoma versus gastric lymphoma, with an element of gastric outlet obstruction with surrounding lymphadenopathy, possible carcinomatosis.   Assessment/Plan: Gastric output obstruction due to gastric malignancy Early satiety/weight loss Failure to thrive -CT abdomen pelvis with concern of gastric outlet obstruction, with malignancy and lymphadenopathy -s/p endoscopy by GI with Dr. 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  -Dr Ellin Saba met with patient and family to discuss treatment plan on 11/11.  Pt wants to pursue chemotherapy.  -07/07/23--gastrojejunostomy--Dr. Pappayliou -holding apixaban until ok with  surgery -plan for UGI study in 2-3 days  Acute on chronic renal failure--CKD 3a  -baseline creatinine 1.2-1.4 -suspect a component of contrast nephropathy -continue IVF for now since pt is npo -obtain UA   History of autoimmune hemolytic anemia -Hgb  is stable   Bilateral pleural effusion -Patient is currently on room air.   History of prostate cancer -Patient received radiation therapy earlier this year, per him and his wife report his PSA has been within normal limit, he is following with urology   History of Diffuse large B-cell lymphoma: Diagnosed in November 2006, treated with R-CHOP chemotherapy with incomplete response.  He is followed by Dr [name].    Atrial flutter on Eliquis, rate controlled - hold apixaban till ok with surgery - PTA not on AV blocking agents. - continue IV lopressor for now that was started on 11/9   DM2, not on insulin -no longer on therapy   02/12/20 A1C 8.2 -06/16/23 A1C 4.6   HLD Resume statin when able to take oral consistently     Essential HTN -previously on metoprolol             Family Communication:   spouse updated 11/14   Consultants:  general surgery   Code Status:  FULL    DVT Prophylaxis:  apixaban on hold     Procedures: As Listed in Progress Note Above   Antibiotics: None       Subjective: Pt complains of abd pain but is controlled.  Denies f/c, cp, sob, n/v.  Passing flatus, no BM  Objective: Vitals:   07/07/23 1700 07/07/23 1952 07/08/23 0403 07/08/23 1618  BP:  (!) 143/79 133/86 (!) 143/89  Pulse: 85 80 79 79  Resp:  20 16 17   Temp:  98.5 F (36.9 C)    TempSrc:  SpO2:  100% 100% 100%  Weight:      Height:        Intake/Output Summary (Last 24 hours) at 07/08/2023 1826 Last data filed at 07/08/2023 1700 Gross per 24 hour  Intake 860.18 ml  Output 250 ml  Net 610.18 ml   Weight change:  Exam:  General:  Pt is alert, follows commands appropriately, not in acute  distress HEENT: No icterus, No thrush, No neck mass, [place]/AT Cardiovascular: RRR, S1/S2, no rubs, no gallops Respiratory: bibasilar crackles. No wheeze Abdomen: Soft/+BS, + tender, non distended, no guarding Extremities: No edema, No lymphangitis, No petechiae, No rashes, no synovitis   Data Reviewed: I have personally reviewed following labs and imaging studies Basic Metabolic Panel: Recent Labs  Lab 07/02/23 0420 07/03/23 0456 07/04/23 0425 07/05/23 0416 07/06/23 0421 07/07/23 0418 07/08/23 0443  NA 138 137 137 139 140 138 140  K 3.8 3.8 3.4* 3.8 3.7 3.5 4.4  CL 102 104 103 106 106 109 106  CO2 26 26 26 25 26 25 23   GLUCOSE 87 91 104* 80 98 94 109*  BUN 11 7* 7* 8 10 9 12   CREATININE 1.21 1.22 1.25* 1.21 1.31* 1.78* 1.96*  CALCIUM 8.6* 8.4* 8.2* 8.1* 8.1* 7.9* 8.1*  MG  --  1.9  --   --   --  1.7 1.7  PHOS 2.6 2.6  --   --   --   --   --    Liver Function Tests: Recent Labs  Lab 07/03/23 0456 07/04/23 0425 07/05/23 0416 07/06/23 0421 07/07/23 0418  AST 13* 12* 13* 12* 11*  ALT 10 9 7 8 8   ALKPHOS 45 44 40 42 41  BILITOT 0.7 0.8 1.0 0.7 0.6  PROT 5.0* 4.9* 5.0* 5.0* 4.8*  ALBUMIN 2.7* 2.7* 2.6* 2.5* 2.5*   No results for input(s): "LIPASE", "AMYLASE" in the last 168 hours. No results for input(s): "AMMONIA" in the last 168 hours. Coagulation Profile: No results for input(s): "INR", "PROTIME" in the last 168 hours. CBC: Recent Labs  Lab 07/04/23 0425 07/05/23 0416 07/06/23 0421 07/07/23 0418 07/08/23 0443  WBC 3.7* 3.8* 4.0 3.7* 8.0  HGB 9.8* 9.8* 9.8* 10.0* 10.6*  HCT 31.9* 31.4* 31.5* 31.7* 34.8*  MCV 97.9 97.5 97.5 96.6 98.0  PLT 149* 153 155 147* 155   Cardiac Enzymes: No results for input(s): "CKTOTAL", "CKMB", "CKMBINDEX", "TROPONINI" in the last 168 hours. BNP: Invalid input(s): "POCBNP" CBG: Recent Labs  Lab 07/07/23 2330 07/08/23 0404 07/08/23 0732 07/08/23 1106 07/08/23 1617  GLUCAP 109* 106* 107* 107* 82   HbA1C: No results for  input(s): "HGBA1C" in the last 72 hours. Urine analysis:    Component Value Date/Time   COLORURINE YELLOW 07/01/2023 1920   APPEARANCEUR HAZY (A) 07/01/2023 1920   APPEARANCEUR Clear 05/03/2023 1056   LABSPEC 1.010 07/01/2023 1920   PHURINE 5.0 07/01/2023 1920   GLUCOSEU NEGATIVE 07/01/2023 1920   HGBUR SMALL (A) 07/01/2023 1920   BILIRUBINUR NEGATIVE 07/01/2023 1920   BILIRUBINUR Negative 05/03/2023 1056   KETONESUR 20 (A) 07/01/2023 1920   PROTEINUR 100 (A) 07/01/2023 1920   NITRITE NEGATIVE 07/01/2023 1920   LEUKOCYTESUR NEGATIVE 07/01/2023 1920   Sepsis Labs: @LABRCNTIP (procalcitonin:4,lacticidven:4) ) Recent Results (from the past 240 hour(s))  Surgical pcr screen     Status: None   Collection Time: 07/06/23  8:17 PM   Specimen: Nasal Mucosa; Nasal Swab  Result Value Ref Range Status   MRSA, PCR NEGATIVE NEGATIVE Final   Staphylococcus  aureus NEGATIVE NEGATIVE Final    Comment: (NOTE) The Xpert SA Assay (FDA approved for NASAL specimens in patients 53 years of age and older), is one component of a comprehensive surveillance program. It is not intended to diagnose infection nor to guide or monitor treatment. Performed at [facility], [address], [place], [place] [address]      Scheduled Meds:  acetaminophen  1,000 mg Per Tube Q6H   metoprolol tartrate  2.5 mg Intravenous Q6H   [START ON 07/09/2023] polyethylene glycol  17 g Per Tube Daily   Continuous Infusions:  magnesium sulfate bolus IVPB      Procedures/Studies: CT ABDOMEN PELVIS W CONTRAST  Result Date: 07/01/2023 CLINICAL DATA:  Abnormal bowel movements for 1 month, nausea, unintentional weight loss, prior history of lymphoma EXAM: CT ABDOMEN AND PELVIS WITH CONTRAST TECHNIQUE: Multidetector CT imaging of the abdomen and pelvis was performed using the standard protocol following bolus administration of intravenous contrast. RADIATION DOSE REDUCTION: This exam was performed according to the departmental  dose-optimization program which includes automated exposure control, adjustment of the mA and/or kV according to patient size and/or use of iterative reconstruction technique. CONTRAST:  OMNIPAQUE IOHEXOL 300 MG/ML  SOLN COMPARISON:  08/04/2019 FINDINGS: Lower chest: There are small bilateral pleural effusions. New bilateral pleural calcifications could reflect sequela of prior pleurodesis or trauma. No acute airspace disease. Chronic scarring at the right lung base. Hepatobiliary: No focal liver abnormality is seen. No gallstones, gallbladder wall thickening, or biliary dilatation. Pancreas: Unremarkable. No pancreatic ductal dilatation or surrounding inflammatory changes. Spleen: Normal in size without focal abnormality. Adrenals/Urinary Tract: Adrenal glands are unremarkable. Kidneys are normal, without renal calculi, focal lesion, or hydronephrosis. Bladder is unremarkable. Stomach/Bowel: There is irregular wall thickening of the gastric antrum and pylorus, measuring up to 19 mm in maximal thickness. This likely results in an element of gastric outlet obstruction, with distension of the proximal stomach. Differential would include gastric lymphoma versus adenocarcinoma. Endoscopy is recommended for further evaluation. No other signs of bowel obstruction or ileus. Mild fecal retention throughout the colon. Vascular/Lymphatic: Numerous subcentimeter lymph nodes surround the gastric antral wall thickening, measuring up to 9 mm in short axis reference image 37/2. Additionally, there are numerous mesenteric nodules throughout the right upper quadrant which could reflect additional mesenteric adenopathy versus peritoneal carcinomatosis. Largest area measures 2.4 x 2.9 cm reference image 39/2. There is diffuse atherosclerosis of the aorta and its branches. Incidental 1 cm aneurysm extending inferiorly from the main right renal artery, reference image 58/4, not significantly changed. Reproductive: Prostate is  unremarkable. Fiduciary markers are seen within the prostate. Other: Trace free fluid within the lower pelvis. No free intraperitoneal gas. No abdominal wall hernia. Musculoskeletal: There are no acute or destructive bony abnormalities. Postsurgical changes are again noted within the midthoracic spine. Reconstructed images demonstrate no additional findings. IMPRESSION: 1. New irregular mural thickening throughout the gastric antrum, concerning for gastric carcinoma versus gastric lymphoma. Endoscopy is recommended for further evaluation. The mural thickening likely results in an element of gastric outlet obstruction, with distension of the proximal stomach. 2. Numerous subcentimeter lymph nodes surrounding the thickened gastric antrum, as well as numerous mesenteric nodules throughout the right upper quadrant, consistent with lymphadenopathy and carcinomatosis. 3. Small bilateral pleural effusions, with new pleural calcifications bilaterally likely sequela of interval pleurodesis or trauma. 4. 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 5.  Aortic Atherosclerosis (ICD10-I70.0). Electronically Signed   By: Maxwell Caul.D.  On: 07/01/2023 20:12    Catarina Hartshorn, DO  [facility]  If 7PM-7AM, please contact night-coverage [email or web address] Password [email or web address] 07/08/2023, 6:26 PM   LOS: 7 days

## 2023-07-08 NOTE — Plan of Care (Signed)
  Problem: Coping: Goal: Level of anxiety will decrease Outcome: Progressing   Problem: Pain Management: Goal: General experience of comfort will improve Outcome: Progressing   Problem: Safety: Goal: Ability to remain free from injury will improve Outcome: Progressing

## 2023-07-08 NOTE — Progress Notes (Addendum)
[facility] Progress Note  1 Day Post-Op  Subjective: Patient seen and examined.  He is resting comfortably in bed.  NG tube remains in place with 700 cc in the canister.  He denies abdominal pain, nausea, and vomiting.  He denies any flatus or bowel movement since surgery.  Objective: Vital signs in last 24 hours: Temp:  [97.4 F (36.3 C)-98.5 F (36.9 C)] 98.5 F (36.9 C) (11/13 1952) Pulse Rate:  [74-85] 79 (11/14 0403) Resp:  [16-25] 16 (11/14 0403) BP: (123-149)/(57-86) 133/86 (11/14 0403) SpO2:  [99 %-100 %] 100 % (11/14 0403) Last BM Date : 06/30/23  Intake/Output from previous day: 11/13 0701 - 11/14 0700 In: 2410.2 [I.V.:2060.2; IV Piggyback:350] Out: 830 [Urine:260; Emesis/NG output:50; Blood:20] Intake/Output this shift: No intake/output data recorded.  General appearance: alert, cooperative, and no distress Nose: NG tube in place with bilious output in canister GI: Abdomen soft, nondistended, no percussion tenderness, nontender to palpation; no rigidity, guarding, rebound tenderness; midline incision C/D/I with honeycomb dressing in place  Lab Results:  Recent Labs    07/07/23 0418 07/08/23 0443  WBC 3.7* 8.0  HGB 10.0* 10.6*  HCT 31.7* 34.8*  PLT 147* 155   BMET Recent Labs    07/07/23 0418 07/08/23 0443  NA 138 140  K 3.5 4.4  CL 109 106  CO2 25 23  GLUCOSE 94 109*  BUN 9 12  CREATININE 1.78* 1.96*  CALCIUM 7.9* 8.1*   PT/INR No results for input(s): "LABPROT", "INR" in the last 72 hours.  Studies/Results: No results found.  Anti-infectives: Anti-infectives (From admission, onward)    Start     Dose/Rate Route Frequency Ordered Stop   07/07/23 1045  ceFAZolin (ANCEF) IVPB 2g/100 mL premix        2 g 200 mL/hr over 30 Minutes Intravenous On call to O.R. 07/07/23 1032 07/07/23 1209   07/07/23 1035  ceFAZolin (ANCEF) 2-4 GM/100ML-% IVPB       Note to Pharmacy: Lillia Mountain: cabinet override      07/07/23 1035  07/07/23 1219       Assessment/Plan:  Patient is a 72 year old male who was admitted with gastric outlet obstruction and concern for gastric malignancy.  Patient is status post palliative gastrojejunostomy on 11/13.  -NG to LIS.  Will maintain NG tube until able to get upper GI 2 to 3 days postoperatively -NPO -Fluids per primary team -Plan to hold Eliquis until Saturday -PRN pain control and antiemetics -Spoke with patient's wife on the phone to update her.  I explained that he is not a candidate for surgical resection of his cancer, which is why we did a palliative gastrojejunostomy.  He will need to undergo cancer treatment per oncology.  All questions were answered to her expressed satisfaction -Appreciate hospitalist and oncology recommendations   LOS: 7 days    Desta Bujak A Zane Samson 07/08/2023

## 2023-07-08 NOTE — Progress Notes (Signed)
Palliative: Chart review completed.  [name] had successful palliative gastrojejunostomy tube placement yesterday secondary to gastric outlet obstruction.  He is stable today.  It is anticipated that the NG tube will remain in place until he is able to have an upper GI evaluation in 2 to 3 days postop.   Plan:  At this point goals are set up for full scope/full code.  Time for outcomes.  Initially, he had wanted to return home but it remains to be seen, his functional status, as he gets closer for discharge.  PMT to follow.  No charge Frank Carmel, NP Palliative medicine team Team phone [phone]

## 2023-07-09 DIAGNOSIS — N179 Acute kidney failure, unspecified: Secondary | ICD-10-CM | POA: Diagnosis not present

## 2023-07-09 DIAGNOSIS — I5032 Chronic diastolic (congestive) heart failure: Secondary | ICD-10-CM | POA: Diagnosis not present

## 2023-07-09 DIAGNOSIS — E782 Mixed hyperlipidemia: Secondary | ICD-10-CM | POA: Diagnosis not present

## 2023-07-09 DIAGNOSIS — K311 Adult hypertrophic pyloric stenosis: Secondary | ICD-10-CM | POA: Diagnosis not present

## 2023-07-09 LAB — URINALYSIS, COMPLETE (UACMP) WITH MICROSCOPIC
Bacteria, UA: NONE SEEN
Bilirubin Urine: NEGATIVE
Glucose, UA: NEGATIVE mg/dL
Ketones, ur: 5 mg/dL — AB
Leukocytes,Ua: NEGATIVE
Nitrite: NEGATIVE
Protein, ur: 100 mg/dL — AB
Specific Gravity, Urine: 1.02 (ref 1.005–1.030)
pH: 5 (ref 5.0–8.0)

## 2023-07-09 LAB — CBC
HCT: 31.3 % — ABNORMAL LOW (ref 39.0–52.0)
Hemoglobin: 9.6 g/dL — ABNORMAL LOW (ref 13.0–17.0)
MCH: 30.4 pg (ref 26.0–34.0)
MCHC: 30.7 g/dL (ref 30.0–36.0)
MCV: 99.1 fL (ref 80.0–100.0)
Platelets: 145 10*3/uL — ABNORMAL LOW (ref 150–400)
RBC: 3.16 MIL/uL — ABNORMAL LOW (ref 4.22–5.81)
RDW: 16 % — ABNORMAL HIGH (ref 11.5–15.5)
WBC: 6.5 10*3/uL (ref 4.0–10.5)
nRBC: 0 % (ref 0.0–0.2)

## 2023-07-09 LAB — BASIC METABOLIC PANEL
Anion gap: 10 (ref 5–15)
BUN: 15 mg/dL (ref 8–23)
CO2: 24 mmol/L (ref 22–32)
Calcium: 8.3 mg/dL — ABNORMAL LOW (ref 8.9–10.3)
Chloride: 107 mmol/L (ref 98–111)
Creatinine, Ser: 1.8 mg/dL — ABNORMAL HIGH (ref 0.61–1.24)
GFR, Estimated: 40 mL/min — ABNORMAL LOW (ref >60)
Glucose, Bld: 82 mg/dL (ref 70–99)
Potassium: 4 mmol/L (ref 3.5–5.1)
Sodium: 141 mmol/L (ref 135–145)

## 2023-07-09 LAB — GLUCOSE, CAPILLARY
Glucose-Capillary: 79 mg/dL (ref 70–99)
Glucose-Capillary: 68 mg/dL — ABNORMAL LOW (ref 70–99)
Glucose-Capillary: 115 mg/dL — ABNORMAL HIGH (ref 70–99)
Glucose-Capillary: 75 mg/dL (ref 70–99)
Glucose-Capillary: 74 mg/dL (ref 70–99)

## 2023-07-09 MED ORDER — DEXTROSE 50 % IV SOLN: 12.5000 g | INTRAVENOUS | Status: AC

## 2023-07-09 MED ORDER — DEXTROSE 50 % IV SOLN
INTRAVENOUS | Status: AC
  Filled 2023-07-09: qty 50

## 2023-07-09 NOTE — Progress Notes (Signed)
PROGRESS NOTE  [name]. QIO:[id number] DOB: 1951/07/21 DOA: 07/01/2023 PCP: [name], MD  Brief History:  72 y.o. male,  with medical history significant for hypertension, atrial flutter on Eliquis, diastolic heart failure, diabetes mellitus type 2, diffuse large B-cell lymphoma status post stem cell transplant 2007, iron deficiency anemia, recent diagnosis of prostate cancer, status postradiation. -Patient presents to ED secondary to complaints of early satiety, bloating, poor appetite and oral intake, belching, nausea, but no vomiting, as well he does report bloating, he denies fever, chills, he reports significant weight loss due to poor oral intake from his GI symptoms. -In ED workup significant for creatinine at 1.34 which is around her baseline, hemoglobin stable at 11.5, white blood cell count at 3.8, patient had CT abdomen pelvis done which was significant for irregular thickening throughout the gastric antrum concerning for gastric carcinoma versus gastric lymphoma, with an element of gastric outlet obstruction with surrounding lymphadenopathy, possible carcinomatosis.   Assessment/Plan: Gastric output obstruction due to gastric adenocarcinoma Early satiety/weight loss Failure to thrive -07/01/23 CT abdomen pelvis with concern of gastric outlet obstruction, with malignancy and lymphadenopathy -s/p endoscopy by GI with Dr. 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  -Dr Ellin Saba met with patient and family to discuss treatment plan on 11/11.  Pt wants to pursue chemotherapy.  -07/07/23--gastrojejunostomy--Dr. Pappayliou -holding apixaban until ok  with surgery -plan for UGI study 11/18   Acute on chronic renal failure--CKD 3a  -baseline creatinine 1.2-1.4 -suspect a component of contrast nephropathy -continue IVF for now since pt is npo -obtain UA   History of autoimmune hemolytic anemia -Hgb  is stable   Bilateral pleural effusion -Patient is currently on room air.   History of prostate cancer -Patient received radiation therapy earlier this year, per him and his wife report his PSA has been within normal limit, he is following with urology   History of Diffuse large B-cell lymphoma: Diagnosed in November 2006, treated with R-CHOP chemotherapy with incomplete response.  He is followed by Dr [name].    Atrial flutter on Eliquis, rate controlled - hold apixaban till ok with surgery - PTA not on AV blocking agents. - continue IV lopressor for now that was started on 11/9   DM2, not on insulin -no longer on therapy   02/12/20 A1C 8.2 -06/16/23 A1C 4.6   HLD Resume statin when able to take oral consistently     Essential HTN -previously on metoprolol             Family Communication:   spouse updated 11/14, left VM on 11/15   Consultants:  general surgery   Code Status:  FULL    DVT Prophylaxis:  apixaban on hold     Procedures: As Listed in Progress Note Above   Antibiotics: None   ote Above  Antibiotics: None      Subjective: He has some abdominal pain but it is controlled.  Denies f/c, cp, sob, n/v/d, +passing flatus, no BM  Objective: Vitals:   07/08/23 1920 07/09/23 0050 07/09/23 0318 07/09/23 1209  BP: (!) 140/75 (!) 144/72 139/84 128/63  Pulse: 79 80 79 78  Resp: 18   18  Temp:  97.8 F (36.6 C)  97.6 F (36.4 C) 97.9 F (36.6 C)  TempSrc: Oral  Oral   SpO2: 100%   100%  Weight:      Height:        Intake/Output Summary (Last 24 hours) at 07/09/2023 1621 Last data filed at 07/09/2023 1254 Gross per 24 hour  Intake 823.75 ml  Output 300 ml  Net 523.75 ml   Weight  change:  Exam:  General:  Pt is alert, follows commands appropriately, not in acute distress HEENT: No icterus, No thrush, No neck mass, [place]/AT Cardiovascular: RRR, S1/S2, no rubs, no gallops Respiratory: bibasilar crackles. No wheeze Abdomen: Soft/+BS, mild central tender, non distended, no guarding Extremities: trace LE edema, No lymphangitis, No petechiae, No rashes, no synovitis   Data Reviewed: I have personally reviewed following labs and imaging studies Basic Metabolic Panel: Recent Labs  Lab 07/03/23 0456 07/04/23 0425 07/05/23 0416 07/06/23 0421 07/07/23 0418 07/08/23 0443 07/09/23 0406  NA 137   < > 139 140 138 140 141  K 3.8   < > 3.8 3.7 3.5 4.4 4.0  CL 104   < > 106 106 109 106 107  CO2 26   < > 25 26 25 23 24   GLUCOSE 91   < > 80 98 94 109* 82  BUN 7*   < > 8 10 9 12 15   CREATININE 1.22   < > 1.21 1.31* 1.78* 1.96* 1.80*  CALCIUM 8.4*   < > 8.1* 8.1* 7.9* 8.1* 8.3*  MG 1.9  --   --   --  1.7 1.7  --   PHOS 2.6  --   --   --   --   --   --    < > = values in this interval not displayed.   Liver Function Tests: Recent Labs  Lab 07/03/23 0456 07/04/23 0425 07/05/23 0416 07/06/23 0421 07/07/23 0418  AST 13* 12* 13* 12* 11*  ALT 10 9 7 8 8   ALKPHOS 45 44 40 42 41  BILITOT 0.7 0.8 1.0 0.7 0.6  PROT 5.0* 4.9* 5.0* 5.0* 4.8*  ALBUMIN 2.7* 2.7* 2.6* 2.5* 2.5*   No results for input(s): "LIPASE", "AMYLASE" in the last 168 hours. No results for input(s): "AMMONIA" in the last 168 hours. Coagulation Profile: No results for input(s): "INR", "PROTIME" in the last 168 hours. CBC: Recent Labs  Lab 07/05/23 0416 07/06/23 0421 07/07/23 0418 07/08/23 0443 07/09/23 0406  WBC 3.8* 4.0 3.7* 8.0 6.5  HGB 9.8* 9.8* 10.0* 10.6* 9.6*  HCT 31.4* 31.5* 31.7* 34.8* 31.3*  MCV 97.5 97.5 96.6 98.0 99.1  PLT 153 155 147* 155 145*   Cardiac Enzymes: No results for input(s): "CKTOTAL", "CKMB", "CKMBINDEX", "TROPONINI" in the last 168 hours. BNP: Invalid input(s):  "POCBNP" CBG: Recent Labs  Lab 07/08/23 1617 07/08/23 2123 07/09/23 0806 07/09/23 1126 07/09/23 1214  GLUCAP 82 85 79 68* 115*   HbA1C: No results for input(s): "HGBA1C" in the last 72 hours. Urine analysis:    Component Value Date/Time   COLORURINE YELLOW 07/01/2023 1920   APPEARANCEUR HAZY (A) 07/01/2023 1920   APPEARANCEUR Clear 05/03/2023 1056   LABSPEC 1.010 07/01/2023 1920   PHURINE 5.0 07/01/2023 1920   GLUCOSEU NEGATIVE 07/01/2023 1920   HGBUR SMALL (A) 07/01/2023 1920   BILIRUBINUR NEGATIVE 07/01/2023 1920   BILIRUBINUR Negative 05/03/2023 1056   KETONESUR 20 (A) 07/01/2023 1920   PROTEINUR 100 (A) 07/01/2023 1920   NITRITE NEGATIVE 07/01/2023 1920  LEUKOCYTESUR NEGATIVE 07/01/2023 1920   Sepsis Labs: @LABRCNTIP (procalcitonin:4,lacticidven:4) ) Recent Results (from the past 240 hour(s))  Surgical pcr screen     Status: None   Collection Time: 07/06/23  8:17 PM   Specimen: Nasal Mucosa; Nasal Swab  Result Value Ref Range Status   MRSA, PCR NEGATIVE NEGATIVE Final   Staphylococcus aureus NEGATIVE NEGATIVE Final    Comment: (NOTE) The Xpert SA Assay (FDA approved for NASAL specimens in patients 1 years of age and older), is one component of a comprehensive surveillance program. It is not intended to diagnose infection nor to guide or monitor treatment. Performed at [facility], [address], [place], [place] [address]      Scheduled Meds:  acetaminophen  1,000 mg Per Tube Q6H   metoprolol tartrate  2.5 mg Intravenous Q6H   polyethylene glycol  17 g Per Tube Daily   Continuous Infusions:  sodium chloride 75 mL/hr at 07/09/23 1146   magnesium sulfate bolus IVPB      Procedures/Studies: CT ABDOMEN PELVIS W CONTRAST  Result Date: 07/01/2023 CLINICAL DATA:  Abnormal bowel movements for 1 month, nausea, unintentional weight loss, prior history of lymphoma EXAM: CT ABDOMEN AND PELVIS WITH CONTRAST TECHNIQUE: Multidetector CT imaging of the abdomen and  pelvis was performed using the standard protocol following bolus administration of intravenous contrast. RADIATION DOSE REDUCTION: This exam was performed according to the departmental dose-optimization program which includes automated exposure control, adjustment of the mA and/or kV according to patient size and/or use of iterative reconstruction technique. CONTRAST:  OMNIPAQUE IOHEXOL 300 MG/ML  SOLN COMPARISON:  08/04/2019 FINDINGS: Lower chest: There are small bilateral pleural effusions. New bilateral pleural calcifications could reflect sequela of prior pleurodesis or trauma. No acute airspace disease. Chronic scarring at the right lung base. Hepatobiliary: No focal liver abnormality is seen. No gallstones, gallbladder wall thickening, or biliary dilatation. Pancreas: Unremarkable. No pancreatic ductal dilatation or surrounding inflammatory changes. Spleen: Normal in size without focal abnormality. Adrenals/Urinary Tract: Adrenal glands are unremarkable. Kidneys are normal, without renal calculi, focal lesion, or hydronephrosis. Bladder is unremarkable. Stomach/Bowel: There is irregular wall thickening of the gastric antrum and pylorus, measuring up to 19 mm in maximal thickness. This likely results in an element of gastric outlet obstruction, with distension of the proximal stomach. Differential would include gastric lymphoma versus adenocarcinoma. Endoscopy is recommended for further evaluation. No other signs of bowel obstruction or ileus. Mild fecal retention throughout the colon. Vascular/Lymphatic: Numerous subcentimeter lymph nodes surround the gastric antral wall thickening, measuring up to 9 mm in short axis reference image 37/2. Additionally, there are numerous mesenteric nodules throughout the right upper quadrant which could reflect additional mesenteric adenopathy versus peritoneal carcinomatosis. Largest area measures 2.4 x 2.9 cm reference image 39/2. There is diffuse atherosclerosis of  the aorta and its branches. Incidental 1 cm aneurysm extending inferiorly from the main right renal artery, reference image 58/4, not significantly changed. Reproductive: Prostate is unremarkable. Fiduciary markers are seen within the prostate. Other: Trace free fluid within the lower pelvis. No free intraperitoneal gas. No abdominal wall hernia. Musculoskeletal: There are no acute or destructive bony abnormalities. Postsurgical changes are again noted within the midthoracic spine. Reconstructed images demonstrate no additional findings. IMPRESSION: 1. New irregular mural thickening throughout the gastric antrum, concerning for gastric carcinoma versus gastric lymphoma. Endoscopy is recommended for further evaluation. The mural thickening likely results in an element of gastric outlet obstruction, with distension of the proximal stomach. 2. Numerous subcentimeter lymph nodes surrounding  the thickened gastric antrum, as well as numerous mesenteric nodules throughout the right upper quadrant, consistent with lymphadenopathy and carcinomatosis. 3. Small bilateral pleural effusions, with new pleural calcifications bilaterally likely sequela of interval pleurodesis or trauma. 4. 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 5.  Aortic Atherosclerosis (ICD10-I70.0). Electronically Signed   By: Sharlet Salina M.D.   On: 07/01/2023 20:12    Catarina Hartshorn, DO  [facility]  If 7PM-7AM, please contact night-coverage [email or web address] Password [email or web address] 07/09/2023, 4:21 PM   LOS: 8 days

## 2023-07-09 NOTE — Progress Notes (Signed)
   07/09/23 1130  Spiritual Encounters  Type of Visit Initial  Care provided to: Patient  Reason for visit Routine spiritual support  OnCall Visit No   I met with the patient, [name], listening as he shared his story. He hopes he is well enough to leave soon. He also expects family to visit today. [name] was looking forward to seeing his wife daughter and grandchild. But, he said it will be even better when he goes home.  As I departed I wished him continued recovery and return to home.   Valerie Roys [facility] [phone]

## 2023-07-09 NOTE — Plan of Care (Signed)

## 2023-07-09 NOTE — Progress Notes (Signed)
[facility] Progress Note  2 Days Post-Op  Subjective: Patient seen and examined.  He is resting comfortably in bed.  He has no complaints at this time.  He confirms passing flatus without any bowel movements since surgery.  NG tube remains in place with 200-300 cc in canister.  Objective: Vital signs in last 24 hours: Temp:  [97.6 F (36.4 C)-97.8 F (36.6 C)] 97.6 F (36.4 C) (11/15 0318) Pulse Rate:  [79-80] 79 (11/15 0318) Resp:  [17-18] 18 (11/14 1920) BP: (139-144)/(72-89) 139/84 (11/15 0318) SpO2:  [100 %] 100 % (11/14 1920) Last BM Date : 06/30/23  Intake/Output from previous day: 11/14 0701 - 11/15 0700 In: -  Out: 300 [Urine:150; Emesis/NG output:150] Intake/Output this shift: No intake/output data recorded.  General appearance: alert, cooperative, and no distress Nose: NG tube remains in place with output in canister GI: Abdomen soft, nondistended, no percussion tenderness, mild incisional tenderness to palpation; rigidity, guarding, rebound tenderness; midline incision C/C/I with honeycomb dressing in place  Lab Results:  Recent Labs    07/08/23 0443 07/09/23 0406  WBC 8.0 6.5  HGB 10.6* 9.6*  HCT 34.8* 31.3*  PLT 155 145*   BMET Recent Labs    07/08/23 0443 07/09/23 0406  NA 140 141  K 4.4 4.0  CL 106 107  CO2 23 24  GLUCOSE 109* 82  BUN 12 15  CREATININE 1.96* 1.80*  CALCIUM 8.1* 8.3*   PT/INR No results for input(s): "LABPROT", "INR" in the last 72 hours.  Studies/Results: No results found.  Anti-infectives: Anti-infectives (From admission, onward)    Start     Dose/Rate Route Frequency Ordered Stop   07/07/23 1045  ceFAZolin (ANCEF) IVPB 2g/100 mL premix        2 g 200 mL/hr over 30 Minutes Intravenous On call to O.R. 07/07/23 1032 07/07/23 1209   07/07/23 1035  ceFAZolin (ANCEF) 2-4 GM/100ML-% IVPB       Note to Pharmacy: Lillia Mountain: cabinet override      07/07/23 1035 07/07/23 1219        Assessment/Plan:  Patient is a 72 year old male who was admitted with gastric outlet obstruction and concern for gastric malignancy.  Patient is status post palliative gastrojejunostomy on 11/13.   -Ideally, would obtain upper GI over the weekend.  However, we do not have a radiologist in-house over the weekend.  Will plan for upper GI on Monday -NG to LIS.   -NPO -Fluids per primary team -Plan to hold Eliquis until Saturday -PRN pain control and antiemetics -Appreciate hospitalist and oncology recommendations   LOS: 8 days    Jaquarious Grey A Veron Senner 07/09/2023

## 2023-07-10 ENCOUNTER — Inpatient Hospital Stay (HOSPITAL_COMMUNITY): Payer: Medicare Other

## 2023-07-10 DIAGNOSIS — N179 Acute kidney failure, unspecified: Secondary | ICD-10-CM | POA: Diagnosis not present

## 2023-07-10 DIAGNOSIS — I5032 Chronic diastolic (congestive) heart failure: Secondary | ICD-10-CM | POA: Diagnosis not present

## 2023-07-10 DIAGNOSIS — K311 Adult hypertrophic pyloric stenosis: Secondary | ICD-10-CM | POA: Diagnosis not present

## 2023-07-10 DIAGNOSIS — C169 Malignant neoplasm of stomach, unspecified: Secondary | ICD-10-CM | POA: Diagnosis not present

## 2023-07-10 LAB — BASIC METABOLIC PANEL
Anion gap: 10 (ref 5–15)
BUN: 15 mg/dL (ref 8–23)
CO2: 24 mmol/L (ref 22–32)
Calcium: 8.1 mg/dL — ABNORMAL LOW (ref 8.9–10.3)
Chloride: 109 mmol/L (ref 98–111)
Creatinine, Ser: 1.32 mg/dL — ABNORMAL HIGH (ref 0.61–1.24)
GFR, Estimated: 58 mL/min — ABNORMAL LOW (ref >60)
Glucose, Bld: 77 mg/dL (ref 70–99)
Potassium: 3.9 mmol/L (ref 3.5–5.1)
Sodium: 143 mmol/L (ref 135–145)

## 2023-07-10 LAB — GLUCOSE, CAPILLARY
Glucose-Capillary: 100 mg/dL — ABNORMAL HIGH (ref 70–99)
Glucose-Capillary: 68 mg/dL — ABNORMAL LOW (ref 70–99)
Glucose-Capillary: 69 mg/dL — ABNORMAL LOW (ref 70–99)
Glucose-Capillary: 75 mg/dL (ref 70–99)
Glucose-Capillary: 77 mg/dL (ref 70–99)
Glucose-Capillary: 89 mg/dL (ref 70–99)
Glucose-Capillary: 92 mg/dL (ref 70–99)
Glucose-Capillary: 92 mg/dL (ref 70–99)

## 2023-07-10 LAB — MAGNESIUM: Magnesium: 1.8 mg/dL (ref 1.7–2.4)

## 2023-07-10 MED ORDER — MAGNESIUM SULFATE 2 GM/50ML IV SOLN
2.0000 g | Freq: Once | INTRAVENOUS | Status: AC
  Administered 2023-07-10: 2 g via INTRAVENOUS
  Filled 2023-07-10: qty 50

## 2023-07-10 MED ORDER — DEXTROSE-SODIUM CHLORIDE 5-0.9 % IV SOLN: INTRAVENOUS | Status: AC

## 2023-07-10 MED ORDER — DEXTROSE 50 % IV SOLN
12.5000 g | INTRAVENOUS | Status: AC
  Administered 2023-07-10: 12.5 g via INTRAVENOUS
  Filled 2023-07-10: qty 50

## 2023-07-10 NOTE — Progress Notes (Signed)
Notified on call provider of venous duplex result.

## 2023-07-10 NOTE — Progress Notes (Signed)
[facility]  No major issues. Left arm looks swollen. I have never seen him before.   BP 137/76 (BP Location: Left Arm)   Pulse 79   Temp 98.4 F (36.9 C) (Axillary)   Resp 18   Ht 5\' 11"  (1.803 m)   Wt 90.5 kg   SpO2 100%   BMI 27.83 kg/m  Left arm swollen > right arm Abdomen soft, honeycomb in place, minimal staining,appropriately tender   Patient s/p palliative gastrojejunostomy on 11/13 for GOO. Doing fair. Left arm looks swollen, I have ordered US to see if DVT in LUE NPO NG to LIS UGI Monday  Frank Greenhouse, MD [facility] [address] [place], [place] [address] [phone] (office)

## 2023-07-10 NOTE — Progress Notes (Signed)
Patient had a hypoglycemic episode, blood glucose of 69, also had an episode of hypoglycemia yesterday. Doctor Tat notified. No new orders at this time.

## 2023-07-10 NOTE — Progress Notes (Signed)
Notified by nurse regarding Superficial thrombus on duplex. At this point, no treatment necessary except warm or cold compresses per patient's preference. Resume anticoagulation as soon as possible.

## 2023-07-10 NOTE — Progress Notes (Signed)
PROGRESS NOTE  [name]. RUE:[id number] DOB: [birth year] DOA: 07/01/2023 PCP: [name], MD  Brief History:  72 y.o. male,  with medical history significant for hypertension, atrial flutter on Eliquis, diastolic heart failure, diabetes mellitus type 2, diffuse large B-cell lymphoma status post stem cell transplant 2007, iron deficiency anemia, recent diagnosis of prostate cancer, status postradiation. -Patient presents to ED secondary to complaints of early satiety, bloating, poor appetite and oral intake, belching, nausea, but no vomiting, as well he does report bloating, he denies fever, chills, he reports significant weight loss due to poor oral intake from his GI symptoms. -In ED workup significant for creatinine at 1.34 which is around her baseline, hemoglobin stable at 11.5, white blood cell count at 3.8, patient had CT abdomen pelvis done which was significant for irregular thickening throughout the gastric antrum concerning for gastric carcinoma versus gastric lymphoma, with an element of gastric outlet obstruction with surrounding lymphadenopathy, possible carcinomatosis.   Assessment/Plan: Gastric output obstruction due to gastric adenocarcinoma Early satiety/weight loss Failure to thrive -07/01/23 CT abdomen pelvis with concern of gastric outlet obstruction, with malignancy and lymphadenopathy -11/8 endoscopy by GI with Dr. 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  -Dr Ellin Saba met with patient and family to discuss treatment plan on 11/11.  Pt wants to pursue chemotherapy.  -07/07/23--gastrojejunostomy--Dr. Pappayliou -holding apixaban until ok  with surgery -plan for UGI study 11/18   Acute on chronic renal failure--CKD 3a  -baseline creatinine 1.2-1.4 -suspect a component of contrast nephropathy -continue IVF for now since pt is npo -obtain UA 100 protein, 11-20 WBC  Hypoglycemia -due to npo status -add D5W to NS -am cortisol   History of autoimmune hemolytic anemia -Hgb  is stable   Bilateral pleural effusion -Patient is currently on room air.   History of prostate cancer -Patient received radiation therapy earlier this year, per him and his wife report his PSA has been within normal limit, he is following with urology   History of Diffuse large B-cell lymphoma: Diagnosed in November 2006, treated with R-CHOP chemotherapy with incomplete response.  He is followed by Dr [name].    Atrial flutter on Eliquis, rate controlled - hold apixaban till ok with surgery - PTA not on AV blocking agents. - continue IV lopressor for now that was started on 11/9   DM2, not on insulin -no longer on therapy   02/12/20 A1C 8.2 -06/16/23 A1C 4.6   HLD Resume statin when able to take oral consistently   Essential HTN -previously on metoprolol   Left arm edema -venous duplex         Family Communication:   spouse updated 11/15   Consultants:  general surgery   Code Status:  FULL    DVT Prophylaxis:  apixaban on hold     Procedures: As Listed in Progress Note Above   Antibiotics: None           Subjective: Patient denies fevers, chills, headache, chest pain, dyspnea, nausea, vomiting, diarrhea, abdominal pain, dysuria, hematuria, hematochezia, and melena.   Objective: Vitals:   07/09/23 0318 07/09/23 1209 07/09/23 1935 07/10/23 0433  BP: 139/84 128/63 118/71 137/76  Pulse: 79 78 79  79  Resp:  18 18 18   Temp: 97.6 F (36.4 C) 97.9 F (36.6 C) 98.4 F (36.9 C) 98.4 F (36.9 C)  TempSrc: Oral  Oral Axillary  SpO2:  100% 100% 100%  Weight:      Height:        Intake/Output Summary (Last  24 hours) at 07/10/2023 1301 Last data filed at 07/10/2023 0900 Gross per 24 hour  Intake 0 ml  Output 400 ml  Net -400 ml   Weight change:  Exam:  General:  Pt is alert, follows commands appropriately, not in acute distress HEENT: No icterus, No thrush, No neck mass, [place]/AT Cardiovascular: RRR, S1/S2, no rubs, no gallops Respiratory: bibasilar rales. No wheeze Abdomen: Soft/+BS, non tender, non distended, no guarding Extremities: 1 + LUE edema, No lymphangitis, No petechiae, No rashes, no synovitis   Data Reviewed: I have personally reviewed following labs and imaging studies Basic Metabolic Panel: Recent Labs  Lab 07/06/23 0421 07/07/23 0418 07/08/23 0443 07/09/23 0406 07/10/23 0416  NA 140 138 140 141 143  K 3.7 3.5 4.4 4.0 3.9  CL 106 109 106 107 109  CO2 26 25 23 24 24   GLUCOSE 98 94 109* 82 77  BUN 10 9 12 15 15   CREATININE 1.31* 1.78* 1.96* 1.80* 1.32*  CALCIUM 8.1* 7.9* 8.1* 8.3* 8.1*  MG  --  1.7 1.7  --  1.8   Liver Function Tests: Recent Labs  Lab 07/04/23 0425 07/05/23 0416 07/06/23 0421 07/07/23 0418  AST 12* 13* 12* 11*  ALT 9 7 8 8   ALKPHOS 44 40 42 41  BILITOT 0.8 1.0 0.7 0.6  PROT 4.9* 5.0* 5.0* 4.8*  ALBUMIN 2.7* 2.6* 2.5* 2.5*   No results for input(s): "LIPASE", "AMYLASE" in the last 168 hours. No results for input(s): "AMMONIA" in the last 168 hours. Coagulation Profile: No results for input(s): "INR", "PROTIME" in the last 168 hours. CBC: Recent Labs  Lab 07/05/23 0416 07/06/23 0421 07/07/23 0418 07/08/23 0443 07/09/23 0406  WBC 3.8* 4.0 3.7* 8.0 6.5  HGB 9.8* 9.8* 10.0* 10.6* 9.6*  HCT 31.4* 31.5* 31.7* 34.8* 31.3*  MCV 97.5 97.5 96.6 98.0 99.1  PLT 153 155 147* 155 145*   Cardiac Enzymes: No results for input(s): "CKTOTAL", "CKMB", "CKMBINDEX", "TROPONINI" in the last 168 hours. BNP: Invalid input(s): "POCBNP" CBG: Recent Labs  Lab 07/10/23 0019 07/10/23 0435 07/10/23 0727 07/10/23 0817 07/10/23 1105  GLUCAP 68*  77 69* 92 75   HbA1C: No results for input(s): "HGBA1C" in the last 72 hours. Urine analysis:    Component Value Date/Time   COLORURINE YELLOW 07/09/2023 1840   APPEARANCEUR HAZY (A) 07/09/2023 1840   APPEARANCEUR Clear 05/03/2023 1056   LABSPEC 1.020 07/09/2023 1840   PHURINE 5.0 07/09/2023 1840   GLUCOSEU NEGATIVE 07/09/2023 1840   HGBUR SMALL (A) 07/09/2023 1840   BILIRUBINUR NEGATIVE 07/09/2023 1840   BILIRUBINUR Negative 05/03/2023 1056   KETONESUR 5 (A) 07/09/2023 1840   PROTEINUR 100 (A) 07/09/2023 1840   NITRITE NEGATIVE 07/09/2023 1840   LEUKOCYTESUR NEGATIVE 07/09/2023 1840   Sepsis Labs: @LABRCNTIP (procalcitonin:4,lacticidven:4) ) Recent Results (from the past 240 hour(s))  Surgical pcr screen     Status: None   Collection Time: 07/06/23  8:17 PM   Specimen: Nasal Mucosa; Nasal Swab  Result Value Ref Range Status   MRSA, PCR NEGATIVE NEGATIVE Final   Staphylococcus aureus NEGATIVE NEGATIVE Final    Comment: (NOTE) The Xpert SA Assay (FDA approved for NASAL specimens in  patients 33 years of age and older), is one component of a comprehensive surveillance program. It is not intended to diagnose infection nor to guide or monitor treatment. Performed at [facility], [address] [place], [place] [address]      Scheduled Meds:  acetaminophen  1,000 mg Per Tube Q6H   metoprolol tartrate  2.5 mg Intravenous Q6H   polyethylene glycol  17 g Per Tube Daily   Continuous Infusions:  dextrose 5 % and 0.9 % NaCl 75 mL/hr at 07/10/23 1213   magnesium sulfate bolus IVPB      Procedures/Studies: CT ABDOMEN PELVIS W CONTRAST  Result Date: 07/01/2023 CLINICAL DATA:  Abnormal bowel movements for 1 month, nausea, unintentional weight loss, prior history of lymphoma EXAM: CT ABDOMEN AND PELVIS WITH CONTRAST TECHNIQUE: Multidetector CT imaging of the abdomen and pelvis was performed using the standard protocol following bolus administration of intravenous contrast.  RADIATION DOSE REDUCTION: This exam was performed according to the departmental dose-optimization program which includes automated exposure control, adjustment of the mA and/or kV according to patient size and/or use of iterative reconstruction technique. CONTRAST:  OMNIPAQUE IOHEXOL 300 MG/ML  SOLN COMPARISON:  08/04/2019 FINDINGS: Lower chest: There are small bilateral pleural effusions. New bilateral pleural calcifications could reflect sequela of prior pleurodesis or trauma. No acute airspace disease. Chronic scarring at the right lung base. Hepatobiliary: No focal liver abnormality is seen. No gallstones, gallbladder wall thickening, or biliary dilatation. Pancreas: Unremarkable. No pancreatic ductal dilatation or surrounding inflammatory changes. Spleen: Normal in size without focal abnormality. Adrenals/Urinary Tract: Adrenal glands are unremarkable. Kidneys are normal, without renal calculi, focal lesion, or hydronephrosis. Bladder is unremarkable. Stomach/Bowel: There is irregular wall thickening of the gastric antrum and pylorus, measuring up to 19 mm in maximal thickness. This likely results in an element of gastric outlet obstruction, with distension of the proximal stomach. Differential would include gastric lymphoma versus adenocarcinoma. Endoscopy is recommended for further evaluation. No other signs of bowel obstruction or ileus. Mild fecal retention throughout the colon. Vascular/Lymphatic: Numerous subcentimeter lymph nodes surround the gastric antral wall thickening, measuring up to 9 mm in short axis reference image 37/2. Additionally, there are numerous mesenteric nodules throughout the right upper quadrant which could reflect additional mesenteric adenopathy versus peritoneal carcinomatosis. Largest area measures 2.4 x 2.9 cm reference image 39/2. There is diffuse atherosclerosis of the aorta and its branches. Incidental 1 cm aneurysm extending inferiorly from the main right renal artery,  reference image 58/4, not significantly changed. Reproductive: Prostate is unremarkable. Fiduciary markers are seen within the prostate. Other: Trace free fluid within the lower pelvis. No free intraperitoneal gas. No abdominal wall hernia. Musculoskeletal: There are no acute or destructive bony abnormalities. Postsurgical changes are again noted within the midthoracic spine. Reconstructed images demonstrate no additional findings. IMPRESSION: 1. New irregular mural thickening throughout the gastric antrum, concerning for gastric carcinoma versus gastric lymphoma. Endoscopy is recommended for further evaluation. The mural thickening likely results in an element of gastric outlet obstruction, with distension of the proximal stomach. 2. Numerous subcentimeter lymph nodes surrounding the thickened gastric antrum, as well as numerous mesenteric nodules throughout the right upper quadrant, consistent with lymphadenopathy and carcinomatosis. 3. Small bilateral pleural effusions, with new pleural calcifications bilaterally likely sequela of interval pleurodesis or trauma. 4. 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 5.  Aortic Atherosclerosis (ICD10-I70.0). Electronically Signed   By: Sharlet Salina M.D.   On: 07/01/2023 20:12    Onalee Hua  Frank Keplinger, DO  [facility]  If 7PM-7AM, please contact night-coverage [email or web address] Password [email or web address] 07/10/2023, 1:01 PM   LOS: 9 days

## 2023-07-11 DIAGNOSIS — K311 Adult hypertrophic pyloric stenosis: Secondary | ICD-10-CM | POA: Diagnosis not present

## 2023-07-11 DIAGNOSIS — N1831 Chronic kidney disease, stage 3a: Secondary | ICD-10-CM | POA: Diagnosis not present

## 2023-07-11 DIAGNOSIS — N179 Acute kidney failure, unspecified: Secondary | ICD-10-CM | POA: Diagnosis not present

## 2023-07-11 DIAGNOSIS — C169 Malignant neoplasm of stomach, unspecified: Secondary | ICD-10-CM | POA: Diagnosis not present

## 2023-07-11 LAB — BASIC METABOLIC PANEL
Anion gap: 10 (ref 5–15)
BUN: 11 mg/dL (ref 8–23)
CO2: 25 mmol/L (ref 22–32)
Calcium: 8 mg/dL — ABNORMAL LOW (ref 8.9–10.3)
Chloride: 106 mmol/L (ref 98–111)
Creatinine, Ser: 1.09 mg/dL (ref 0.61–1.24)
GFR, Estimated: >60 mL/min (ref >60)
Glucose, Bld: 117 mg/dL — ABNORMAL HIGH (ref 70–99)
Potassium: 3.7 mmol/L (ref 3.5–5.1)
Sodium: 141 mmol/L (ref 135–145)

## 2023-07-11 LAB — GLUCOSE, CAPILLARY
Glucose-Capillary: 106 mg/dL — ABNORMAL HIGH (ref 70–99)
Glucose-Capillary: 119 mg/dL — ABNORMAL HIGH (ref 70–99)
Glucose-Capillary: 124 mg/dL — ABNORMAL HIGH (ref 70–99)
Glucose-Capillary: 124 mg/dL — ABNORMAL HIGH (ref 70–99)
Glucose-Capillary: 92 mg/dL (ref 70–99)

## 2023-07-11 LAB — MAGNESIUM: Magnesium: 1.9 mg/dL (ref 1.7–2.4)

## 2023-07-11 LAB — PHOSPHORUS: Phosphorus: 1.7 mg/dL — ABNORMAL LOW (ref 2.5–4.6)

## 2023-07-11 LAB — CORTISOL-AM, BLOOD: Cortisol - AM: 17.6 ug/dL (ref 6.7–22.6)

## 2023-07-11 MED ORDER — APIXABAN 5 MG PO TABS
5.0000 mg | ORAL_TABLET | Freq: Two times a day (BID) | ORAL | Status: DC
  Administered 2023-07-11 – 2023-07-12 (×2): 5 mg via ORAL
  Filled 2023-07-11 (×2): qty 1

## 2023-07-11 NOTE — Progress Notes (Signed)
PROGRESS NOTE  [name]. BJY:[id number] DOB: 27-Feb-1951 DOA: 07/01/2023 PCP: [name], MD  Brief History:  72 y.o. male,  with medical history significant for hypertension, atrial flutter on Eliquis, diastolic heart failure, diabetes mellitus type 2, diffuse large B-cell lymphoma status post stem cell transplant 2007, iron deficiency anemia, recent diagnosis of prostate cancer, status postradiation. -Patient presents to ED secondary to complaints of early satiety, bloating, poor appetite and oral intake, belching, nausea, but no vomiting, as well he does report bloating, he denies fever, chills, he reports significant weight loss due to poor oral intake from his GI symptoms. -In ED workup significant for creatinine at 1.34 which is around her baseline, hemoglobin stable at 11.5, white blood cell count at 3.8, patient had CT abdomen pelvis done which was significant for irregular thickening throughout the gastric antrum concerning for gastric carcinoma versus gastric lymphoma, with an element of gastric outlet obstruction with surrounding lymphadenopathy, possible carcinomatosis.   Assessment/Plan: Gastric output obstruction due to gastric adenocarcinoma Early satiety/weight loss Failure to thrive -07/01/23 CT abdomen pelvis with concern of gastric outlet obstruction, with malignancy and lymphadenopathy -11/8 endoscopy by GI with Dr. 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  -Dr Ellin Saba met with patient and family to discuss treatment plan on 11/11.  Pt wants to pursue chemotherapy.  -07/07/23--gastrojejunostomy--Dr. Pappayliou -restart apixaban  11/17 -plan for UGI study 11/18   Acute on chronic renal failure--CKD 3a  -baseline creatinine 1.2-1.4 -suspect a component of contrast nephropathy -continue IVF for now since pt is npo -improved with IVF   Hypoglycemia -due to npo status -add D5W to NS -am cortisol--17.6  Superficial vein thrombus--cephalic vein -local care   History of autoimmune hemolytic anemia -Hgb  is stable   Bilateral pleural effusion -Patient is currently on room air.   History of prostate cancer -Patient received radiation therapy earlier this year, per him and his wife report his PSA has been within normal limit, he is following with urology   History of Diffuse large B-cell lymphoma: Diagnosed in November 2006, treated with R-CHOP chemotherapy with incomplete response.  He is followed by Dr [name].    Atrial flutter on Eliquis, rate controlled - restart apixaban 11/17 - PTA not on AV blocking agents. - continue IV lopressor for now that was started on 11/9   DM2, not on insulin -no longer on therapy   02/12/20 A1C 8.2 -06/16/23 A1C 4.6   HLD Resume statin when able to take oral consistently   Essential HTN -previously on metoprolol -IV lopressor until able to tolerate po         Family Communication:   sister updated 11/17   Consultants:  general surgery   Code Status:  FULL    DVT Prophylaxis:  apixaban on hold     Procedures: As Listed in Progress Note Above   Antibiotics: None        Subjective: Pt had 3 BMs.  Denies f,c, cp, sob, n/v/d, abd pain  Objective: Vitals:   07/10/23 1927 07/11/23 0354 07/11/23 1235 07/11/23 1642  BP: 128/85 (!) 150/75 134/81 (!) 148/83  Pulse: 72 81 82 81  Resp: 20 20    Temp:  98.4 F (36.9 C) 98.7 F (37.1 C)    TempSrc: Oral Oral    SpO2:  100% 100%   Weight:      Height:        Intake/Output Summary (Last 24 hours) at 07/11/2023 1736 Last data filed at 07/11/2023 1017 Gross per 24 hour  Intake 0 ml  Output 1050  ml  Net -1050 ml   Weight change:  Exam:  General:  Pt is alert, follows commands appropriately, not in acute distress HEENT: No icterus, No thrush, No neck mass, [place]/AT Cardiovascular: RRR, S1/S2, no rubs, no gallops Respiratory: bibasilar rales.  No wheeze Abdomen: Soft/+BS, non tender, non distended, no guarding Extremities: Nonpitting edema, No lymphangitis, No petechiae, No rashes, no synovitis   Data Reviewed: I have personally reviewed following labs and imaging studies Basic Metabolic Panel: Recent Labs  Lab 07/07/23 0418 07/08/23 0443 07/09/23 0406 07/10/23 0416 07/11/23 0431  NA 138 140 141 143 141  K 3.5 4.4 4.0 3.9 3.7  CL 109 106 107 109 106  CO2 25 23 24 24 25   GLUCOSE 94 109* 82 77 117*  BUN 9 12 15 15 11   CREATININE 1.78* 1.96* 1.80* 1.32* 1.09  CALCIUM 7.9* 8.1* 8.3* 8.1* 8.0*  MG 1.7 1.7  --  1.8 1.9  PHOS  --   --   --   --  1.7*   Liver Function Tests: Recent Labs  Lab 07/05/23 0416 07/06/23 0421 07/07/23 0418  AST 13* 12* 11*  ALT 7 8 8   ALKPHOS 40 42 41  BILITOT 1.0 0.7 0.6  PROT 5.0* 5.0* 4.8*  ALBUMIN 2.6* 2.5* 2.5*   No results for input(s): "LIPASE", "AMYLASE" in the last 168 hours. No results for input(s): "AMMONIA" in the last 168 hours. Coagulation Profile: No results for input(s): "INR", "PROTIME" in the last 168 hours. CBC: Recent Labs  Lab 07/05/23 0416 07/06/23 0421 07/07/23 0418 07/08/23 0443 07/09/23 0406  WBC 3.8* 4.0 3.7* 8.0 6.5  HGB 9.8* 9.8* 10.0* 10.6* 9.6*  HCT 31.4* 31.5* 31.7* 34.8* 31.3*  MCV 97.5 97.5 96.6 98.0 99.1  PLT 153 155 147* 155 145*   Cardiac Enzymes: No results for input(s): "CKTOTAL", "CKMB", "CKMBINDEX", "TROPONINI" in the last 168 hours. BNP: Invalid input(s): "POCBNP" CBG: Recent Labs  Lab 07/10/23 2338 07/11/23 0334 07/11/23 0739 07/11/23 1210 07/11/23 1628  GLUCAP 100* 106* 119* 124* 124*   HbA1C: No results for input(s): "HGBA1C" in the last 72 hours. Urine analysis:     Component Value Date/Time   COLORURINE YELLOW 07/09/2023 1840   APPEARANCEUR HAZY (A) 07/09/2023 1840   APPEARANCEUR Clear 05/03/2023 1056   LABSPEC 1.020 07/09/2023 1840   PHURINE 5.0 07/09/2023 1840   GLUCOSEU NEGATIVE 07/09/2023 1840   HGBUR SMALL (A) 07/09/2023 1840   BILIRUBINUR NEGATIVE 07/09/2023 1840   BILIRUBINUR Negative 05/03/2023 1056   KETONESUR 5 (A) 07/09/2023 1840   PROTEINUR 100 (A) 07/09/2023 1840   NITRITE NEGATIVE 07/09/2023 1840   LEUKOCYTESUR NEGATIVE 07/09/2023 1840   Sepsis Labs: @LABRCNTIP (procalcitonin:4,lacticidven:4) ) Recent Results (from the past 240 hour(s))  Surgical pcr screen     Status: None   Collection Time: 07/06/23  8:17 PM   Specimen: Nasal Mucosa; Nasal Swab  Result Value Ref Range Status   MRSA, PCR NEGATIVE NEGATIVE Final   Staphylococcus aureus NEGATIVE NEGATIVE Final    Comment: (NOTE) The Xpert SA Assay (FDA approved for NASAL specimens in patients 47 years of age and older), is one component of  a comprehensive surveillance program. It is not intended to diagnose infection nor to guide or monitor treatment. Performed at [facility], [address], [place], [place] [address]      Scheduled Meds:  acetaminophen  1,000 mg Per Tube Q6H   apixaban  5 mg Oral BID   metoprolol tartrate  2.5 mg Intravenous Q6H   polyethylene glycol  17 g Per Tube Daily   Continuous Infusions:  Procedures/Studies: US Venous Img Upper Uni Left (DVT)  Result Date: 07/10/2023 CLINICAL DATA:  Left upper extremity pain and edema. Evaluate for DVT. EXAM: LEFT UPPER EXTREMITY VENOUS DOPPLER ULTRASOUND TECHNIQUE: Gray-scale sonography with graded compression, as well as color Doppler and duplex ultrasound were performed to evaluate the upper extremity deep venous system from the level of the subclavian vein and including the jugular, axillary, basilic, radial, ulnar and upper cephalic vein. Spectral Doppler was utilized to evaluate flow at rest and with  distal augmentation maneuvers. COMPARISON:  None Available. FINDINGS: Contralateral Subclavian Vein: Respiratory phasicity is normal and symmetric with the symptomatic side. No evidence of thrombus. Normal compressibility. Internal Jugular Vein: No evidence of thrombus. Normal compressibility, respiratory phasicity and response to augmentation. Subclavian Vein: No evidence of thrombus. Normal compressibility, respiratory phasicity and response to augmentation. Axillary Vein: No evidence of thrombus. Normal compressibility, respiratory phasicity and response to augmentation. Cephalic Vein: While the cephalic vein appears patent at the level of the left upper arm (image 23), there is hypoechoic occlusive thrombus within the cephalic vein at the level of the antecubital fossa (image 26). The cephalic vein appears patent at the level of the wrist (image 27). Basilic Vein: No evidence of thrombus. Normal compressibility, respiratory phasicity and response to augmentation. Brachial Veins: No evidence of thrombus. Normal compressibility, respiratory phasicity and response to augmentation. Radial Veins: No evidence of thrombus. Normal compressibility, respiratory phasicity and response to augmentation. Ulnar Veins: No evidence of thrombus. Normal compressibility, respiratory phasicity and response to augmentation. Other Findings: There is a moderate amount of subcutaneous edema at the level of the left forearm (images 32 through 34). IMPRESSION: 1. Occlusive thrombus within the cephalic vein at the level of the antecubital fossa. 2. No evidence of DVT within the left upper extremity. Electronically Signed   By: Simonne Come M.D.   On: 07/10/2023 16:22   CT ABDOMEN PELVIS W CONTRAST  Result Date: 07/01/2023 CLINICAL DATA:  Abnormal bowel movements for 1 month, nausea, unintentional weight loss, prior history of lymphoma EXAM: CT ABDOMEN AND PELVIS WITH CONTRAST TECHNIQUE: Multidetector CT imaging of the abdomen and pelvis  was performed using the standard protocol following bolus administration of intravenous contrast. RADIATION DOSE REDUCTION: This exam was performed according to the departmental dose-optimization program which includes automated exposure control, adjustment of the mA and/or kV according to patient size and/or use of iterative reconstruction technique. CONTRAST:  OMNIPAQUE IOHEXOL 300 MG/ML  SOLN COMPARISON:  08/04/2019 FINDINGS: Lower chest: There are small bilateral pleural effusions. New bilateral pleural calcifications could reflect sequela of prior pleurodesis or trauma. No acute airspace disease. Chronic scarring at the right lung base. Hepatobiliary: No focal liver abnormality is seen. No gallstones, gallbladder wall thickening, or biliary dilatation. Pancreas: Unremarkable. No pancreatic ductal dilatation or surrounding inflammatory changes. Spleen: Normal in size without focal abnormality. Adrenals/Urinary Tract: Adrenal glands are unremarkable. Kidneys are normal, without renal calculi, focal lesion, or hydronephrosis. Bladder is unremarkable. Stomach/Bowel: There is irregular wall thickening of the gastric antrum and pylorus, measuring up to 19 mm in  maximal thickness. This likely results in an element of gastric outlet obstruction, with distension of the proximal stomach. Differential would include gastric lymphoma versus adenocarcinoma. Endoscopy is recommended for further evaluation. No other signs of bowel obstruction or ileus. Mild fecal retention throughout the colon. Vascular/Lymphatic: Numerous subcentimeter lymph nodes surround the gastric antral wall thickening, measuring up to 9 mm in short axis reference image 37/2. Additionally, there are numerous mesenteric nodules throughout the right upper quadrant which could reflect additional mesenteric adenopathy versus peritoneal carcinomatosis. Largest area measures 2.4 x 2.9 cm reference image 39/2. There is diffuse atherosclerosis of the aorta  and its branches. Incidental 1 cm aneurysm extending inferiorly from the main right renal artery, reference image 58/4, not significantly changed. Reproductive: Prostate is unremarkable. Fiduciary markers are seen within the prostate. Other: Trace free fluid within the lower pelvis. No free intraperitoneal gas. No abdominal wall hernia. Musculoskeletal: There are no acute or destructive bony abnormalities. Postsurgical changes are again noted within the midthoracic spine. Reconstructed images demonstrate no additional findings. IMPRESSION: 1. New irregular mural thickening throughout the gastric antrum, concerning for gastric carcinoma versus gastric lymphoma. Endoscopy is recommended for further evaluation. The mural thickening likely results in an element of gastric outlet obstruction, with distension of the proximal stomach. 2. Numerous subcentimeter lymph nodes surrounding the thickened gastric antrum, as well as numerous mesenteric nodules throughout the right upper quadrant, consistent with lymphadenopathy and carcinomatosis. 3. Small bilateral pleural effusions, with new pleural calcifications bilaterally likely sequela of interval pleurodesis or trauma. 4. 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 5.  Aortic Atherosclerosis (ICD10-I70.0). Electronically Signed   By: Sharlet Salina M.D.   On: 07/01/2023 20:12    Frank Hartshorn, [name]  [facility]  If 7PM-7AM, please contact night-coverage [email or web address] Password [email or web address] 07/11/2023, 5:36 PM   LOS: 10 days

## 2023-07-11 NOTE — Progress Notes (Signed)
[facility]  [place] LUE with superficial thrombus. Patient report 3-4 Bms.   BP 134/81 (BP Location: Right Arm)   Pulse 82   Temp 98.7 F (37.1 C) (Oral)   Resp 20   Ht 5\' 11"  (1.803 m)   Wt 90.5 kg   SpO2 100%   BMI 27.83 kg/m  Soft, honeycomb c/d/I, appropriately tender   Patient s/p gastrojejunostomy for cancer.  NG, NPO Dr. Robyne Peers note had said hold Eliquis until Saturday so can restart that.  UGI tomorrow.   Algis Greenhouse, MD [facility] [address] [place] [place], [place] [address] [phone] (office)

## 2023-07-12 ENCOUNTER — Encounter (HOSPITAL_COMMUNITY): Payer: Self-pay | Admitting: Surgery

## 2023-07-12 ENCOUNTER — Inpatient Hospital Stay (HOSPITAL_COMMUNITY)
Admit: 2023-07-12 | Discharge: 2023-07-12 | Disposition: A | Discharge status: Home / Self Care | Payer: Medicare Other | Attending: Surgery

## 2023-07-12 DIAGNOSIS — N179 Acute kidney failure, unspecified: Secondary | ICD-10-CM | POA: Diagnosis not present

## 2023-07-12 DIAGNOSIS — I5032 Chronic diastolic (congestive) heart failure: Secondary | ICD-10-CM | POA: Diagnosis not present

## 2023-07-12 DIAGNOSIS — E440 Moderate protein-calorie malnutrition: Secondary | ICD-10-CM | POA: Diagnosis not present

## 2023-07-12 DIAGNOSIS — K311 Adult hypertrophic pyloric stenosis: Secondary | ICD-10-CM | POA: Diagnosis not present

## 2023-07-12 LAB — GLUCOSE, CAPILLARY
Glucose-Capillary: 98 mg/dL (ref 70–99)
Glucose-Capillary: 81 mg/dL (ref 70–99)
Glucose-Capillary: 102 mg/dL — ABNORMAL HIGH (ref 70–99)
Glucose-Capillary: 94 mg/dL (ref 70–99)

## 2023-07-12 LAB — MOLECULAR PATHOLOGY

## 2023-07-12 LAB — SURGICAL PATHOLOGY

## 2023-07-12 MED ORDER — ACETAMINOPHEN 500 MG PO TABS
1000.0000 mg | ORAL_TABLET | Freq: Four times a day (QID) | ORAL | Status: DC
  Administered 2023-07-12 – 2023-07-14 (×6): 1000 mg via ORAL
  Filled 2023-07-12 (×9): qty 2

## 2023-07-12 MED ORDER — POLYETHYLENE GLYCOL 3350 17 G PO PACK
17.0000 g | PACK | Freq: Every day | ORAL | Status: DC
  Administered 2023-07-13: 17 g via ORAL
  Filled 2023-07-12 (×2): qty 1

## 2023-07-12 MED ORDER — METOPROLOL TARTRATE 5 MG/5ML IV SOLN
2.5000 mg | Freq: Four times a day (QID) | INTRAVENOUS | Status: DC
  Administered 2023-07-12 – 2023-07-15 (×12): 2.5 mg via INTRAVENOUS
  Filled 2023-07-12 (×15): qty 5

## 2023-07-12 MED ORDER — APIXABAN 5 MG PO TABS
5.0000 mg | ORAL_TABLET | Freq: Two times a day (BID) | ORAL | Status: DC
  Administered 2023-07-12 – 2023-07-15 (×6): 5 mg via ORAL
  Filled 2023-07-12 (×7): qty 1

## 2023-07-12 MED ORDER — SIMETHICONE 80 MG PO CHEW: 80.0000 mg | CHEWABLE_TABLET | Freq: Four times a day (QID) | ORAL | Status: DC | PRN

## 2023-07-12 MED ORDER — DIATRIZOATE MEGLUMINE & SODIUM 66-10 % PO SOLN
150.0000 mL | Freq: Once | ORAL | Status: AC
  Administered 2023-07-12: 150 mL
  Filled 2023-07-12: qty 150

## 2023-07-12 MED ORDER — OXYCODONE HCL 5 MG PO TABS: 5.0000 mg | ORAL_TABLET | ORAL | Status: DC | PRN

## 2023-07-12 MED ORDER — APIXABAN 5 MG PO TABS: 5.0000 mg | ORAL_TABLET | Freq: Two times a day (BID) | ORAL | Status: DC

## 2023-07-12 MED ORDER — ACETAMINOPHEN 500 MG PO TABS
1000.0000 mg | ORAL_TABLET | Freq: Four times a day (QID) | ORAL | Status: DC
  Administered 2023-07-12: 1000 mg
  Filled 2023-07-12 (×2): qty 2

## 2023-07-12 MED ORDER — POTASSIUM PHOSPHATES 15 MMOLE/5ML IV SOLN
30.0000 mmol | Freq: Once | INTRAVENOUS | Status: AC
  Administered 2023-07-12: 30 mmol via INTRAVENOUS
  Filled 2023-07-12: qty 10

## 2023-07-12 NOTE — Plan of Care (Signed)
  Problem: Education: Goal: Knowledge of General Education information will improve Description Including pain rating scale, medication(s)/side effects and non-pharmacologic comfort measures Outcome: Progressing   Problem: Health Behavior/Discharge Planning: Goal: Ability to manage health-related needs will improve Outcome: Progressing   

## 2023-07-12 NOTE — Progress Notes (Signed)
Mobility Specialist Progress Note:    07/12/23 1554  Mobility  Activity Transferred from bed to chair  Level of Assistance +2 (takes two people) (pt requested)  Assistive Device [employer] Ambulated (ft) 4 ft  Range of Motion/Exercises Active;All extremities  Activity Response Tolerated well  Mobility Referral Yes  $Mobility charge 1 Mobility  Mobility Specialist Start Time (ACUTE ONLY) 1520  Mobility Specialist Stop Time (ACUTE ONLY) 1540  Mobility Specialist Time Calculation (min) (ACUTE ONLY) 20 min   Pt received in bed, agreeable to mobility. Requested +2 to transfer bed to chair with cane. Tolerated well, asx throughout. Left pt in chair, alarm on. Call bell in reach, all needs met.   Lawerance Bach Mobility Specialist Please contact via Special educational needs teacher or  Rehab office at [phone]

## 2023-07-12 NOTE — Progress Notes (Signed)
Removed NG tube per order, pt tolerated well. Will continue to monitor.

## 2023-07-12 NOTE — Progress Notes (Signed)
PROGRESS NOTE  [name]. NWG:[id number] DOB: [birth year] DOA: 07/01/2023 PCP: [name], MD  Brief History:  72 y.o. male,  with medical history significant for hypertension, atrial flutter on Eliquis, diastolic heart failure, diabetes mellitus type 2, diffuse large B-cell lymphoma status post stem cell transplant 2007, iron deficiency anemia, recent diagnosis of prostate cancer, status postradiation. -Patient presents to ED secondary to complaints of early satiety, bloating, poor appetite and oral intake, belching, nausea, but no vomiting, as well he does report bloating, he denies fever, chills, he reports significant weight loss due to poor oral intake from his GI symptoms. -In ED workup significant for creatinine at 1.34 which is around her baseline, hemoglobin stable at 11.5, white blood cell count at 3.8, patient had CT abdomen pelvis done which was significant for irregular thickening throughout the gastric antrum concerning for gastric carcinoma versus gastric lymphoma, with an element of gastric outlet obstruction with surrounding lymphadenopathy, possible carcinomatosis.   Assessment/Plan: Gastric output obstruction due to gastric adenocarcinoma Early satiety/weight loss Failure to thrive -07/01/23 CT abdomen pelvis with concern of gastric outlet obstruction, with malignancy and lymphadenopathy -11/8 endoscopy by GI with Dr. 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  -Dr Ellin Saba met with patient and family to discuss treatment plan on 11/11.  Pt wants to pursue chemotherapy.  -07/07/23--gastrojejunostomy--Dr. Pappayliou -restart apixaban  11/17 -UGI study 11/18---Patent gastrojejunostomy anastomosis without evidence of stricture or contrast extravasation --11/18--start clear liquid diet   Acute on chronic renal failure--CKD 3a  -baseline creatinine 1.2-1.4 -suspect a component of contrast nephropathy -continue IVF for now since pt is npo -improved with IVF   Hypoglycemia -due to npo status -add D5W to NS -am cortisol--17.6   Superficial vein thrombus--cephalic vein -local care   History of autoimmune hemolytic anemia -Hgb  is stable   Bilateral pleural effusion -Patient is currently on room air.   History of prostate cancer -Patient received radiation therapy earlier this year, per him and his wife report his PSA has been within normal limit, he is following with urology   History of Diffuse large B-cell lymphoma: Diagnosed in November 2006, treated with R-CHOP chemotherapy with incomplete response.  He is followed by Dr [name].    Atrial flutter on Eliquis, rate controlled - restart apixaban 11/17 - PTA not on AV blocking agents. - continue IV lopressor for now that was started on 11/9   DM2, not on insulin -no longer on therapy   02/12/20 A1C 8.2 -06/16/23 A1C 4.6   HLD Resume statin when able to take oral consistently   Essential HTN -previously on metoprolol -IV lopressor until able to tolerate po   Hypophosphatemia -repeted       Family Communication:   sister updated 11/17   Consultants:  general surgery   Code Status:  FULL    DVT Prophylaxis:  apixaban on hold     Procedures: As Listed in Progress Note Above   Antibiotics: None        Subjective:  Patient denies fevers, chills, headache, chest pain, dyspnea, nausea, vomiting, diarrhea, abdominal pain, dysuria, hematuria, hematochezia, and melena.  Objective: Vitals:   07/11/23 2013 07/12/23 0245 07/12/23 1315 07/12/23 1726  BP: 130/81 (!) 141/88 (!) 142/76 107/67  Pulse: 83 83 85 (!) 54  Resp: 18 18 20     Temp: (!) 97.5 F (36.4 C) (!) 97.3 F (36.3 C) 98.2 F (36.8 C)   TempSrc: Oral Oral Oral   SpO2: 100% 100% 100%   Weight:      Height:        Intake/Output Summary (Last 24 hours) at 07/12/2023 1747 Last data filed at 07/12/2023 1550 Gross per 24 hour  Intake 363.16 ml  Output 950 ml  Net -586.84 ml   Weight change:  Exam:  General:  Pt is alert, follows commands appropriately, not in acute distress HEENT: No icterus, No thrush, No neck mass, [place]/AT Cardiovascular: RRR, S1/S2, no rubs, no gallops Respiratory: CTA bilaterally, no wheezing, no crackles, no rhonchi Abdomen: Soft/+BS, non tender, non distended, no guarding Extremities: No edema, No lymphangitis, No petechiae, No rashes, no synovitis   Data Reviewed: I have personally reviewed following labs and imaging studies Basic Metabolic Panel: Recent Labs  Lab 07/07/23 0418 07/08/23 0443 07/09/23 0406 07/10/23 0416 07/11/23 0431  NA 138 140 141 143 141  K 3.5 4.4 4.0 3.9 3.7  CL 109 106 107 109 106  CO2 25 23 24 24 25   GLUCOSE 94 109* 82 77 117*  BUN 9 12 15 15 11   CREATININE 1.78* 1.96* 1.80* 1.32* 1.09  CALCIUM 7.9* 8.1* 8.3* 8.1* 8.0*  MG 1.7 1.7  --  1.8 1.9  PHOS  --   --   --   --  1.7*   Liver Function Tests: Recent Labs  Lab 07/06/23 0421 07/07/23 0418  AST 12* 11*  ALT 8 8  ALKPHOS 42 41  BILITOT 0.7 0.6  PROT 5.0* 4.8*  ALBUMIN 2.5* 2.5*   No results for input(s): "LIPASE", "AMYLASE" in the last 168 hours. No results for input(s): "AMMONIA" in the last 168 hours. Coagulation Profile: No results for input(s): "INR", "PROTIME" in the last 168 hours. CBC: Recent Labs  Lab 07/06/23 0421 07/07/23 0418 07/08/23 0443 07/09/23 0406  WBC 4.0 3.7* 8.0 6.5  HGB 9.8* 10.0* 10.6* 9.6*  HCT 31.5* 31.7* 34.8* 31.3*  MCV 97.5 96.6 98.0 99.1  PLT 155 147* 155 145*   Cardiac Enzymes: No results for input(s): "CKTOTAL", "CKMB", "CKMBINDEX", "TROPONINI" in the last 168 hours. BNP: Invalid  input(s): "POCBNP" CBG: Recent Labs  Lab 07/11/23 1628 07/11/23 2231 07/12/23 0246 07/12/23 0756 07/12/23 1650  GLUCAP 124* 92 98 81 102*   HbA1C: No results for input(s): "HGBA1C" in the last 72 hours. Urine analysis:    Component Value Date/Time   COLORURINE YELLOW 07/09/2023 1840   APPEARANCEUR HAZY (A) 07/09/2023 1840   APPEARANCEUR Clear 05/03/2023 1056   LABSPEC 1.020 07/09/2023 1840   PHURINE 5.0 07/09/2023 1840   GLUCOSEU NEGATIVE 07/09/2023 1840   HGBUR SMALL (A) 07/09/2023 1840   BILIRUBINUR NEGATIVE 07/09/2023 1840   BILIRUBINUR Negative 05/03/2023 1056   KETONESUR 5 (A) 07/09/2023 1840   PROTEINUR 100 (A) 07/09/2023 1840   NITRITE NEGATIVE 07/09/2023 1840   LEUKOCYTESUR NEGATIVE 07/09/2023 1840   Sepsis Labs: @LABRCNTIP (procalcitonin:4,lacticidven:4) ) Recent Results (from the past 240 hour(s))  Surgical pcr screen     Status: None   Collection Time: 07/06/23  8:17 PM   Specimen: Nasal Mucosa; Nasal Swab  Result Value Ref Range Status   MRSA, PCR NEGATIVE NEGATIVE Final   Staphylococcus aureus NEGATIVE NEGATIVE Final    Comment: (NOTE) The Xpert SA Assay (FDA approved  for NASAL specimens in patients 38 years of age and older), is one component of a comprehensive surveillance program. It is not intended to diagnose infection nor to guide or monitor treatment. Performed at [facility], [address], [place], [place] [address]      Scheduled Meds:  acetaminophen  1,000 mg Oral Q6H   apixaban  5 mg Oral BID   metoprolol tartrate  2.5 mg Intravenous Q6H   [START ON 07/13/2023] polyethylene glycol  17 g Oral Daily   Continuous Infusions:  Procedures/Studies: DG UGI W SINGLE CM (SOL OR THIN BA)  Result Date: 07/12/2023 CLINICAL DATA:  History of gastric outlet obstruction, post surgical creation of a gastrojejunostomy. EXAM: DG UGI W SINGLE CM TECHNIQUE: Single contrast examination was then performed using water-soluble contrast. FLUOROSCOPY: 2  minutes (33.5 mGy) COMPARISON:  CT abdomen and pelvis-07/01/2023 FINDINGS: Patient was positioned supine on the fluoroscopy table Preprocedural spot fluoroscopic image was obtained of the upper abdomen demonstrate enteric tube tip and side port project over the expected location of the gastric fundus. Midline skin staples. Water-soluble contrast was then administered via the nasogastric tube with opacification of the gastric lumen. There is a moderate-to-large amount of reflux noted to the level of the mid/distal esophagus. Ultimately, there is passage of contrast from the stomach lumen through the anastomosis with the opacification of the small bowel. Passage of contrast was improved by positioning on the fluoroscopy table at a 45 degree angle. No discrete areas of contrast extravasation are identified. There is minimal passage of contrast through the native gastric antrum to the level of the descending portion of the duodenum. IMPRESSION: 1. Patent gastrojejunostomy anastomosis without evidence of stricture or contrast extravasation. Passage of contrast through the anastomosis with sluggish though improved with positioning of the patient had a 45 degree angle. 2. Moderate-to-large amount of reflux to the level of the mid esophagus. Electronically Signed   By: Simonne Come M.D.   On: 07/12/2023 12:01   US Venous Img Upper Uni Left (DVT)  Result Date: 07/10/2023 CLINICAL DATA:  Left upper extremity pain and edema. Evaluate for DVT. EXAM: LEFT UPPER EXTREMITY VENOUS DOPPLER ULTRASOUND TECHNIQUE: Gray-scale sonography with graded compression, as well as color Doppler and duplex ultrasound were performed to evaluate the upper extremity deep venous system from the level of the subclavian vein and including the jugular, axillary, basilic, radial, ulnar and upper cephalic vein. Spectral Doppler was utilized to evaluate flow at rest and with distal augmentation maneuvers. COMPARISON:  None Available. FINDINGS:  Contralateral Subclavian Vein: Respiratory phasicity is normal and symmetric with the symptomatic side. No evidence of thrombus. Normal compressibility. Internal Jugular Vein: No evidence of thrombus. Normal compressibility, respiratory phasicity and response to augmentation. Subclavian Vein: No evidence of thrombus. Normal compressibility, respiratory phasicity and response to augmentation. Axillary Vein: No evidence of thrombus. Normal compressibility, respiratory phasicity and response to augmentation. Cephalic Vein: While the cephalic vein appears patent at the level of the left upper arm (image 23), there is hypoechoic occlusive thrombus within the cephalic vein at the level of the antecubital fossa (image 26). The cephalic vein appears patent at the level of the wrist (image 27). Basilic Vein: No evidence of thrombus. Normal compressibility, respiratory phasicity and response to augmentation. Brachial Veins: No evidence of thrombus. Normal compressibility, respiratory phasicity and response to augmentation. Radial Veins: No evidence of thrombus. Normal compressibility, respiratory phasicity and response to augmentation. Ulnar Veins: No evidence of thrombus. Normal compressibility, respiratory phasicity and response to augmentation.  Other Findings: There is a moderate amount of subcutaneous edema at the level of the left forearm (images 32 through 34). IMPRESSION: 1. Occlusive thrombus within the cephalic vein at the level of the antecubital fossa. 2. No evidence of DVT within the left upper extremity. Electronically Signed   By: Simonne Come M.D.   On: 07/10/2023 16:22   CT ABDOMEN PELVIS W CONTRAST  Result Date: 07/01/2023 CLINICAL DATA:  Abnormal bowel movements for 1 month, nausea, unintentional weight loss, prior history of lymphoma EXAM: CT ABDOMEN AND PELVIS WITH CONTRAST TECHNIQUE: Multidetector CT imaging of the abdomen and pelvis was performed using the standard protocol following bolus  administration of intravenous contrast. RADIATION DOSE REDUCTION: This exam was performed according to the departmental dose-optimization program which includes automated exposure control, adjustment of the mA and/or kV according to patient size and/or use of iterative reconstruction technique. CONTRAST:  OMNIPAQUE IOHEXOL 300 MG/ML  SOLN COMPARISON:  08/04/2019 FINDINGS: Lower chest: There are small bilateral pleural effusions. New bilateral pleural calcifications could reflect sequela of prior pleurodesis or trauma. No acute airspace disease. Chronic scarring at the right lung base. Hepatobiliary: No focal liver abnormality is seen. No gallstones, gallbladder wall thickening, or biliary dilatation. Pancreas: Unremarkable. No pancreatic ductal dilatation or surrounding inflammatory changes. Spleen: Normal in size without focal abnormality. Adrenals/Urinary Tract: Adrenal glands are unremarkable. Kidneys are normal, without renal calculi, focal lesion, or hydronephrosis. Bladder is unremarkable. Stomach/Bowel: There is irregular wall thickening of the gastric antrum and pylorus, measuring up to 19 mm in maximal thickness. This likely results in an element of gastric outlet obstruction, with distension of the proximal stomach. Differential would include gastric lymphoma versus adenocarcinoma. Endoscopy is recommended for further evaluation. No other signs of bowel obstruction or ileus. Mild fecal retention throughout the colon. Vascular/Lymphatic: Numerous subcentimeter lymph nodes surround the gastric antral wall thickening, measuring up to 9 mm in short axis reference image 37/2. Additionally, there are numerous mesenteric nodules throughout the right upper quadrant which could reflect additional mesenteric adenopathy versus peritoneal carcinomatosis. Largest area measures 2.4 x 2.9 cm reference image 39/2. There is diffuse atherosclerosis of the aorta and its branches. Incidental 1 cm aneurysm extending  inferiorly from the main right renal artery, reference image 58/4, not significantly changed. Reproductive: Prostate is unremarkable. Fiduciary markers are seen within the prostate. Other: Trace free fluid within the lower pelvis. No free intraperitoneal gas. No abdominal wall hernia. Musculoskeletal: There are no acute or destructive bony abnormalities. Postsurgical changes are again noted within the midthoracic spine. Reconstructed images demonstrate no additional findings. IMPRESSION: 1. New irregular mural thickening throughout the gastric antrum, concerning for gastric carcinoma versus gastric lymphoma. Endoscopy is recommended for further evaluation. The mural thickening likely results in an element of gastric outlet obstruction, with distension of the proximal stomach. 2. Numerous subcentimeter lymph nodes surrounding the thickened gastric antrum, as well as numerous mesenteric nodules throughout the right upper quadrant, consistent with lymphadenopathy and carcinomatosis. 3. Small bilateral pleural effusions, with new pleural calcifications bilaterally likely sequela of interval pleurodesis or trauma. 4. 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 5.  Aortic Atherosclerosis (ICD10-I70.0). Electronically Signed   By: Sharlet Salina M.D.   On: 07/01/2023 20:12    Frank Hartshorn, DO  [facility]  If 7PM-7AM, please contact night-coverage [email or web address] Password [email or web address] 07/12/2023, 5:47 PM   LOS: 11 days

## 2023-07-12 NOTE — Progress Notes (Signed)
[facility] Progress Note  5 Days Post-Op  Subjective: Patient seen and examined.  He is resting comfortably in bed.  He has no complaints of abdominal pain.  He confirms passing flatus.  Objective: Vital signs in last 24 hours: Temp:  [97.3 F (36.3 C)-97.5 F (36.4 C)] 97.3 F (36.3 C) (11/18 0245) Pulse Rate:  [81-83] 83 (11/18 0245) Resp:  [18] 18 (11/18 0245) BP: (130-148)/(81-88) 141/88 (11/18 0245) SpO2:  [100 %] 100 % (11/18 0245) Last BM Date : 06/30/23  Intake/Output from previous day: 11/17 0701 - 11/18 0700 In: 80 [NG/GT:80] Out: 550 [Urine:250; Emesis/NG output:300] Intake/Output this shift: No intake/output data recorded.  General appearance: alert, cooperative, and no distress GI: Abdomen soft, nondistended, no percussion tenderness, nontender to palpation; no rigidity, guarding, or rebound tenderness; midline incision C/D/I with skin staples in place  Lab Results:  No results for input(s): "WBC", "HGB", "HCT", "PLT" in the last 72 hours. BMET Recent Labs    07/10/23 0416 07/11/23 0431  NA 143 141  K 3.9 3.7  CL 109 106  CO2 24 25  GLUCOSE 77 117*  BUN 15 11  CREATININE 1.32* 1.09  CALCIUM 8.1* 8.0*   PT/INR No results for input(s): "LABPROT", "INR" in the last 72 hours.  Studies/Results: US Venous Img Upper Uni Left (DVT)  Result Date: 07/10/2023 CLINICAL DATA:  Left upper extremity pain and edema. Evaluate for DVT. EXAM: LEFT UPPER EXTREMITY VENOUS DOPPLER ULTRASOUND TECHNIQUE: Gray-scale sonography with graded compression, as well as color Doppler and duplex ultrasound were performed to evaluate the upper extremity deep venous system from the level of the subclavian vein and including the jugular, axillary, basilic, radial, ulnar and upper cephalic vein. Spectral Doppler was utilized to evaluate flow at rest and with distal augmentation maneuvers. COMPARISON:  None Available. FINDINGS: Contralateral Subclavian Vein: Respiratory  phasicity is normal and symmetric with the symptomatic side. No evidence of thrombus. Normal compressibility. Internal Jugular Vein: No evidence of thrombus. Normal compressibility, respiratory phasicity and response to augmentation. Subclavian Vein: No evidence of thrombus. Normal compressibility, respiratory phasicity and response to augmentation. Axillary Vein: No evidence of thrombus. Normal compressibility, respiratory phasicity and response to augmentation. Cephalic Vein: While the cephalic vein appears patent at the level of the left upper arm (image 23), there is hypoechoic occlusive thrombus within the cephalic vein at the level of the antecubital fossa (image 26). The cephalic vein appears patent at the level of the wrist (image 27). Basilic Vein: No evidence of thrombus. Normal compressibility, respiratory phasicity and response to augmentation. Brachial Veins: No evidence of thrombus. Normal compressibility, respiratory phasicity and response to augmentation. Radial Veins: No evidence of thrombus. Normal compressibility, respiratory phasicity and response to augmentation. Ulnar Veins: No evidence of thrombus. Normal compressibility, respiratory phasicity and response to augmentation. Other Findings: There is a moderate amount of subcutaneous edema at the level of the left forearm (images 32 through 34). IMPRESSION: 1. Occlusive thrombus within the cephalic vein at the level of the antecubital fossa. 2. No evidence of DVT within the left upper extremity. Electronically Signed   By: Simonne Come M.D.   On: 07/10/2023 16:22    Anti-infectives: Anti-infectives (From admission, onward)    Start     Dose/Rate Route Frequency Ordered Stop   07/07/23 1045  ceFAZolin (ANCEF) IVPB 2g/100 mL premix        2 g 200 mL/hr over 30 Minutes Intravenous On call to O.R. 07/07/23 1032 07/07/23 1209   07/07/23 1035  ceFAZolin (ANCEF) 2-4 GM/100ML-% IVPB       Note to Pharmacy: Lillia Mountain: cabinet override       07/07/23 1035 07/07/23 1219       Assessment/Plan:  Patient is a 72 year old male who was admitted with gastric outlet obstruction and concern for gastric malignancy.  Patient is status post palliative gastrojejunostomy on 11/13.   -Plan for UGI today.  Further recommendations to follow imaging -NG to LIS -NPO -Fluids per primary team -Continue Eliquis -PRN pain control and antiemetics -Appreciate hospitalist and oncology recommendations   LOS: 11 days    [name] A Khilynn Borntreger 07/12/2023

## 2023-07-13 DIAGNOSIS — N1831 Chronic kidney disease, stage 3a: Secondary | ICD-10-CM | POA: Diagnosis not present

## 2023-07-13 DIAGNOSIS — N179 Acute kidney failure, unspecified: Secondary | ICD-10-CM | POA: Diagnosis not present

## 2023-07-13 DIAGNOSIS — K311 Adult hypertrophic pyloric stenosis: Secondary | ICD-10-CM | POA: Diagnosis not present

## 2023-07-13 DIAGNOSIS — Z7189 Other specified counseling: Secondary | ICD-10-CM | POA: Diagnosis not present

## 2023-07-13 DIAGNOSIS — C169 Malignant neoplasm of stomach, unspecified: Secondary | ICD-10-CM | POA: Diagnosis not present

## 2023-07-13 DIAGNOSIS — Z515 Encounter for palliative care: Secondary | ICD-10-CM | POA: Diagnosis not present

## 2023-07-13 LAB — PHOSPHORUS: Phosphorus: 2.1 mg/dL — ABNORMAL LOW (ref 2.5–4.6)

## 2023-07-13 LAB — CBC
HCT: 34.8 % — ABNORMAL LOW (ref 39.0–52.0)
Hemoglobin: 10.7 g/dL — ABNORMAL LOW (ref 13.0–17.0)
MCH: 29.9 pg (ref 26.0–34.0)
MCHC: 30.7 g/dL (ref 30.0–36.0)
MCV: 97.2 fL (ref 80.0–100.0)
Platelets: 120 10*3/uL — ABNORMAL LOW (ref 150–400)
RBC: 3.58 MIL/uL — ABNORMAL LOW (ref 4.22–5.81)
RDW: 15.4 % (ref 11.5–15.5)
WBC: 7.2 10*3/uL (ref 4.0–10.5)
nRBC: 0 % (ref 0.0–0.2)

## 2023-07-13 LAB — GLUCOSE, CAPILLARY
Glucose-Capillary: 101 mg/dL — ABNORMAL HIGH (ref 70–99)
Glucose-Capillary: 104 mg/dL — ABNORMAL HIGH (ref 70–99)
Glucose-Capillary: 87 mg/dL (ref 70–99)
Glucose-Capillary: 97 mg/dL (ref 70–99)

## 2023-07-13 LAB — BASIC METABOLIC PANEL
Anion gap: 11 (ref 5–15)
BUN: 11 mg/dL (ref 8–23)
CO2: 25 mmol/L (ref 22–32)
Calcium: 8.3 mg/dL — ABNORMAL LOW (ref 8.9–10.3)
Chloride: 104 mmol/L (ref 98–111)
Creatinine, Ser: 1.02 mg/dL (ref 0.61–1.24)
GFR, Estimated: >60 mL/min (ref >60)
Glucose, Bld: 95 mg/dL (ref 70–99)
Potassium: 3.8 mmol/L (ref 3.5–5.1)
Sodium: 140 mmol/L (ref 135–145)

## 2023-07-13 LAB — MAGNESIUM: Magnesium: 1.6 mg/dL — ABNORMAL LOW (ref 1.7–2.4)

## 2023-07-13 MED ORDER — POTASSIUM PHOSPHATES 15 MMOLE/5ML IV SOLN
30.0000 mmol | Freq: Once | INTRAVENOUS | Status: AC
  Administered 2023-07-14: 30 mmol via INTRAVENOUS
  Filled 2023-07-13: qty 10

## 2023-07-13 MED ORDER — ENSURE ENLIVE PO LIQD
237.0000 mL | Freq: Three times a day (TID) | ORAL | Status: DC
  Administered 2023-07-13 – 2023-07-15 (×5): 237 mL via ORAL

## 2023-07-13 MED ORDER — POLYVINYL ALCOHOL 1.4 % OP SOLN: 1.0000 [drp] | OPHTHALMIC | Status: DC | PRN

## 2023-07-13 MED ORDER — PROCHLORPERAZINE EDISYLATE 10 MG/2ML IJ SOLN
5.0000 mg | Freq: Three times a day (TID) | INTRAMUSCULAR | Status: DC
  Administered 2023-07-14 – 2023-07-15 (×6): 5 mg via INTRAVENOUS
  Filled 2023-07-13 (×6): qty 2

## 2023-07-13 MED ORDER — MAGNESIUM SULFATE 2 GM/50ML IV SOLN
2.0000 g | Freq: Once | INTRAVENOUS | Status: AC
  Administered 2023-07-13: 2 g via INTRAVENOUS
  Filled 2023-07-13: qty 50

## 2023-07-13 NOTE — Progress Notes (Signed)
Has been sitting in chair this since this morning after walking with PT.  Had bm in toilet and for lunch ate jello, drank tea, half of ensure and half cup water with miralax.  Also, ate few bites of broth.  No C/O nausea.  Staples to abdomen dry and intact with no signs of infection. IV metoprolol not given at 1200 due to BP 98/60 manually

## 2023-07-13 NOTE — Evaluation (Signed)
Physical Therapy Evaluation Patient Details Name: [name]. MRN: [id number] DOB: 06-27-1951 Today's Date: 07/13/2023  History of Present Illness  Aarn Blocker  is a 72 y.o. male,  with medical history significant for hypertension, atrial flutter on Eliquis, diastolic heart failure, diabetes mellitus type 2, diffuse large B-cell lymphoma status post stem cell transplant 2007, iron deficiency anemia, recent diagnosis of prostate cancer, status postradiation .  -Patient presents to ED secondary to complaints of early satiety, bloating, poor appetite and oral intake, belching, nausea, but no vomiting, as well he does report bloating, he denies fever, chills, he reports significant weight loss due to poor oral intake from his GI symptoms.  -In ED workup significant for creatinine at 1.34 which is around her baseline, hemoglobin stable at 11.5, white blood cell count at 3.8, patient had CT abdomen pelvis done which was significant for irregular thickening throughout the gastric antrum concerning for gastric carcinoma versus gastric lymphoma, with an element of gastric outlet obstruction with surrounding lymphadenopathy, possible carcinomatosis.   Clinical Impression  Patient demonstrates slow labored movement for sitting up at bedside, had difficulty using SPC standing due to weakness, flexed trunk and had to use RW for safety.  Patient demonstrated slow labored movement for transferring to/from commode having to use rails, and had near loss of balance walking back to chair due to fatigue and c/o generalized weakness. Patient tolerated sitting up in chair after therapy - nursing staff notified.  Patient will benefit from continued skilled physical therapy in hospital and recommended venue below to increase strength, balance, endurance for safe ADLs and gait.           If plan is discharge home, recommend the following: A lot of help with bathing/dressing/bathroom;A lot of help with walking and/or  transfers;Help with stairs or ramp for entrance;Assistance with cooking/housework   Can travel by private vehicle   Yes    Equipment Recommendations None recommended by PT  Recommendations for Other Services       Functional Status Assessment Patient has had a recent decline in their functional status and demonstrates the ability to make significant improvements in function in a reasonable and predictable amount of time.     Precautions / Restrictions Precautions Precautions: Fall Restrictions Weight Bearing Restrictions: No      Mobility  Bed Mobility Overal bed mobility: Needs Assistance Bed Mobility: Supine to Sit     Supine to sit: Min assist     General bed mobility comments: increased time, labored movement    Transfers Overall transfer level: Needs assistance Equipment used: Rolling walker (2 wheels), Straight cane Transfers: Sit to/from Stand, Bed to chair/wheelchair/BSC Sit to Stand: Min assist, Mod assist   Step pivot transfers: Min assist, Mod assist       General transfer comment: had much diffiuclty completing sit to stand using SPC, had to lean on nearby objects for support, safer using RW    Ambulation/Gait Ambulation/Gait assistance: Min assist, Mod assist Gait Distance (Feet): 20 Feet Assistive device: Rolling walker (2 wheels) Gait Pattern/deviations: Decreased step length - right, Decreased step length - left, Decreased stride length, Trunk flexed Gait velocity: slow     General Gait Details: slow labored movement with flexed trunk and easily fatigue walking to bathroom, once fatigued became very unsteady on feet with near loss of balance using RW  Stairs            Wheelchair Mobility     Tilt Bed    Modified Rankin (  Stroke Patients Only)       Balance Overall balance assessment: Needs assistance Sitting-balance support: Feet supported, No upper extremity supported Sitting balance-Leahy Scale: Fair Sitting balance -  Comments: fair/good seated at EOB   Standing balance support: Reliant on assistive device for balance, During functional activity, Bilateral upper extremity supported Standing balance-Leahy Scale: Poor Standing balance comment: fair/poor using RW                             Pertinent Vitals/Pain Pain Assessment Pain Assessment: Faces Faces Pain Scale: Hurts a little bit Pain Location: stomach Pain Descriptors / Indicators: Aching Pain Intervention(s): Limited activity within patient's tolerance, Monitored during session, Repositioned    Home Living Family/patient expects to be discharged to:: Private residence Living Arrangements: Spouse/significant other Available Help at Discharge: Family;Available 24 hours/day Type of Home: Apartment Home Access: Level entry       Home Layout: One level Home Equipment: Pharmacist, hospital (2 wheels);Cane - single point;Grab bars - tub/shower      Prior Function Prior Level of Function : Independent/Modified Independent;Driving             Mobility Comments: household and short distanced community ambulator using SPC, RW PRN ADLs Comments: Independent     Extremity/Trunk Assessment   Upper Extremity Assessment Upper Extremity Assessment: Generalized weakness    Lower Extremity Assessment Lower Extremity Assessment: Generalized weakness    Cervical / Trunk Assessment Cervical / Trunk Assessment: Kyphotic  Communication   Communication Communication: No apparent difficulties  Cognition Arousal: Alert Behavior During Therapy: WFL for tasks assessed/performed Overall Cognitive Status: Within Functional Limits for tasks assessed                                          General Comments      Exercises     Assessment/Plan    PT Assessment Patient needs continued PT services  PT Problem List Decreased strength;Decreased activity tolerance;Decreased balance;Decreased mobility        PT Treatment Interventions DME instruction;Gait training;Stair training;Functional mobility training;Therapeutic activities;Therapeutic exercise;Balance training;Patient/family education    PT Goals (Current goals can be found in the Care Plan section)  Acute Rehab PT Goals Patient Stated Goal: return home with family to assist PT Goal Formulation: With patient Time For Goal Achievement: 07/27/23 Potential to Achieve Goals: Good    Frequency Min 3X/week     Co-evaluation               AM-PAC PT "6 Clicks" Mobility  Outcome Measure Help needed turning from your back to your side while in a flat bed without using bedrails?: A Little Help needed moving from lying on your back to sitting on the side of a flat bed without using bedrails?: A Little Help needed moving to and from a bed to a chair (including a wheelchair)?: A Lot Help needed standing up from a chair using your arms (e.g., wheelchair or bedside chair)?: A Little Help needed to walk in hospital room?: A Lot Help needed climbing 3-5 steps with a railing? : A Lot 6 Click Score: 15    End of Session   Activity Tolerance: Patient tolerated treatment well;Patient limited by fatigue Patient left: in chair;with call bell/phone within reach Nurse Communication: Mobility status PT Visit Diagnosis: Unsteadiness on feet (R26.81);Other abnormalities of gait and mobility (R26.89);Muscle  weakness (generalized) (M62.81)    Time: 1308-6578 PT Time Calculation (min) (ACUTE ONLY): 30 min   Charges:   PT Evaluation $PT Eval Moderate Complexity: 1 Mod PT Treatments $Therapeutic Activity: 23-37 mins PT General Charges $$ ACUTE PT VISIT: 1 Visit         2:52 PM, 07/13/23 Ocie Bob, MPT Physical Therapist with [facility] [phone] office [phone] mobile phone

## 2023-07-13 NOTE — Progress Notes (Signed)
Palliative:   Mr. [name] is sitting up in the Coulter chair in his room.  He greets me, making and mostly keeping eye contact.  He appears stable overall.  He is alert and oriented, able to make his needs known.  There is no family at bedside at this time.  We talked about his acute health conditions and the treatment plan.  He shares that he just had another, larger bowel movement.  You shared that he was concerned as he was a little dizzy when he got up.  We talk about his recent surgery, poor by mouth intake for several days.  I encouraged him to take things slowly.  We talked about further testing that may be implemented.  Mr. [name] shares that although he was a little dizzy he still prefers to return home if possible, but understands that he may need short-term rehab.Marland Kitchen  He states that he feels like his wife can help care for him once he is able to return home.  He states he would have follow-up with his trusted oncologist, Dr. Ellin Saba outpatient. No Conference with attending, surgery, bedside nursing staff, transition of care team related to patient condition, needs, goals of care, disposition.  Plan: At this point continue full scope/full code.  Home with home health if needed/qualified.  Follow-up with trusted oncologist, Dr. Ellin Saba, outpatient.  Requesting ambulance ride home.  50 minutes  Lillia Carmel, NP Palliative medicine team Team phone [phone]

## 2023-07-13 NOTE — TOC Progression Note (Signed)
Transition of Care (TOC) - Progression Note    Patient Details  Name: [name]. MRN: [id number] Date of Birth: 08/05/1951  Transition of Care [facility]) CM/SW Contact  Villa Herb, [place] Phone Number: 07/13/2023, 12:14 PM  Clinical Narrative:    CSW updated that PT is recommending SNF for pt at D/C. CSW spoke with pt and spouse and they are agreeable to a SNF referral at this time. CSW to complete referral and send out to local facilities. TOC to follow.   Expected Discharge Plan: Home/Self Care Barriers to Discharge: Continued Medical Work up  Expected Discharge Plan and Services                                               Social Determinants of Health (SDOH) Interventions SDOH Screenings   Food Insecurity: No Food Insecurity (07/01/2023)  Housing: Low Risk  (07/01/2023)  Transportation Needs: No Transportation Needs (07/01/2023)  Utilities: Not At Risk (07/01/2023)  Alcohol Screen: Low Risk  (06/28/2020)  Depression (PHQ2-9): Low Risk  (05/01/2020)  Financial Resource Strain: Low Risk  (06/28/2020)  Physical Activity: Inactive (06/28/2020)  Social Connections: Moderately Integrated (06/28/2020)  Stress: No Stress Concern Present (06/28/2020)  Tobacco Use: Medium Risk (07/05/2023)    Readmission Risk Interventions     No data to display

## 2023-07-13 NOTE — Progress Notes (Signed)
[facility] Progress Note  6 Days Post-Op  Subjective: Patient seen and examined.  He is resting comfortably in bed.  He only had a small amount of his clear liquids with some coffee, tea, and water.  He confirms passing flatus and had a small bowel movement last night.  Objective: Vital signs in last 24 hours: Temp:  [97.5 F (36.4 C)-98.4 F (36.9 C)] 98.4 F (36.9 C) (11/19 0401) Pulse Rate:  [51-85] 85 (11/19 0401) Resp:  [18-20] 19 (11/19 0401) BP: (92-142)/(58-76) 119/66 (11/19 0401) SpO2:  [100 %] 100 % (11/19 0401) Last BM Date : 07/12/23  Intake/Output from previous day: 11/18 0701 - 11/19 0700 In: 494.6 [IV Piggyback:494.6] Out: 650 [Urine:150; Emesis/NG output:500] Intake/Output this shift: No intake/output data recorded.  General appearance: alert, cooperative, and no distress GI: Abdomen soft, nondistended, no percussion tenderness, mild incisional TTP; no rigidity, guarding, or rebound tenderness; midline incision C/D/I with skin staples in place  Lab Results:  Recent Labs    07/13/23 0442  WBC 7.2  HGB 10.7*  HCT 34.8*  PLT 120*   BMET Recent Labs    07/11/23 0431 07/13/23 0442  NA 141 140  K 3.7 3.8  CL 106 104  CO2 25 25  GLUCOSE 117* 95  BUN 11 11  CREATININE 1.09 1.02  CALCIUM 8.0* 8.3*   PT/INR No results for input(s): "LABPROT", "INR" in the last 72 hours.  Studies/Results: DG UGI W SINGLE CM (SOL OR THIN BA)  Result Date: 07/12/2023 CLINICAL DATA:  History of gastric outlet obstruction, post surgical creation of a gastrojejunostomy. EXAM: DG UGI W SINGLE CM TECHNIQUE: Single contrast examination was then performed using water-soluble contrast. FLUOROSCOPY: 2 minutes (33.5 mGy) COMPARISON:  CT abdomen and pelvis-07/01/2023 FINDINGS: Patient was positioned supine on the fluoroscopy table Preprocedural spot fluoroscopic image was obtained of the upper abdomen demonstrate enteric tube tip and side port project over the  expected location of the gastric fundus. Midline skin staples. Water-soluble contrast was then administered via the nasogastric tube with opacification of the gastric lumen. There is a moderate-to-large amount of reflux noted to the level of the mid/distal esophagus. Ultimately, there is passage of contrast from the stomach lumen through the anastomosis with the opacification of the small bowel. Passage of contrast was improved by positioning on the fluoroscopy table at a 45 degree angle. No discrete areas of contrast extravasation are identified. There is minimal passage of contrast through the native gastric antrum to the level of the descending portion of the duodenum. IMPRESSION: 1. Patent gastrojejunostomy anastomosis without evidence of stricture or contrast extravasation. Passage of contrast through the anastomosis with sluggish though improved with positioning of the patient had a 45 degree angle. 2. Moderate-to-large amount of reflux to the level of the mid esophagus. Electronically Signed   By: Simonne Come M.D.   On: 07/12/2023 12:01    Anti-infectives: Anti-infectives (From admission, onward)    Start     Dose/Rate Route Frequency Ordered Stop   07/07/23 1045  ceFAZolin (ANCEF) IVPB 2g/100 mL premix        2 g 200 mL/hr over 30 Minutes Intravenous On call to O.R. 07/07/23 1032 07/07/23 1209   07/07/23 1035  ceFAZolin (ANCEF) 2-4 GM/100ML-% IVPB       Note to Pharmacy: Lillia Mountain: cabinet override      07/07/23 1035 07/07/23 1219       Assessment/Plan:  Patient is a 72 year old male who was admitted with gastric outlet  obstruction and concern for gastric malignancy.  Patient is status post palliative gastrojejunostomy on 11/13.   -UGI on 11/18 demonstrated patency of gastrojejunostomy without evidence of extravasation -Continue clear liquids.  Explained that I want him to take in some more clear liquids prior to advancing his diet.  If he tolerates clears for lunch, can advance  to full liquids for dinner -Continue bowel regimen.  Explained that he has not had much intake in the last week, so he may not have a larger bowel movement until he has increased oral intake -Fluids per primary team -Continue Eliquis -PRN pain control and antiemetics -Appreciate hospitalist and oncology recommendations  Pathology from mesenteric deposit: A. 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   Note: Confirmatory immunohistochemical stains were performed which show  positivity for keratin cocktail and weak CDX2.  S100 and CD68 highlight  associated fat necrosis.  CK20 and CK7 are both weakly positive.    LOS: 12 days    Jerriann Schrom A Karrin Eisenmenger 07/13/2023

## 2023-07-13 NOTE — NC FL2 (Signed)
[place] MEDICAID FL2 LEVEL OF CARE FORM     IDENTIFICATION  Patient Name: [name]. Birthdate: 1950-11-27 Sex: male Admission Date (Current Location): 07/01/2023  [place] and [place] Number:  [employer] and Address:  [facility],  [address] [place] [address]      Provider Number: [id number]  Attending Physician Name and Address:  Catarina Hartshorn, MD  Relative Name and Phone Number:       Current Level of Care: Hospital Recommended Level of Care: Skilled Nursing Facility Prior Approval Number:    Date Approved/Denied:   PASRR Number: [id number]  Discharge Plan: SNF    Current Diagnoses: Patient Active Problem List   Diagnosis Date Noted   Acute renal failure superimposed on stage 3a chronic kidney disease (HCC) 07/08/2023   Gastric adenocarcinoma (HCC) 07/05/2023   Gastric mass 07/02/2023   Malnutrition of moderate degree 07/02/2023   Gastric outlet obstruction 07/01/2023   Aortic atherosclerosis (HCC) 08/27/2022   Malignant neoplasm of prostate (HCC) 06/22/2022   Elevated PSA 05/14/2022   Vitamin D deficiency 01/25/2020   Uncontrolled type 2 diabetes mellitus with hyperglycemia (HCC) 12/14/2019   Cellulitis of lower extremity 12/14/2019   Autoimmune hemolytic anemia due to IgG (HCC)    Symptomatic anemia    Anemia 08/02/2019   Macrocytic anemia 08/02/2019   Atrial flutter (HCC) 09/07/2017   Bilateral pneumonia 09/07/2017   Acute respiratory failure with hypoxia (HCC) 05/19/2017   AKI (acute kidney injury) (HCC)    Sepsis due to pneumonia (HCC) 05/18/2017   Type 2 diabetes mellitus with complication, without long-term current use of insulin (HCC) 02/19/2017   Essential hypertension, benign 04/03/2015   IDA (iron deficiency anemia)    Mucosal abnormality of stomach    Hx of adenomatous colonic polyps 06/13/2012   Status post autologous bone marrow transplantation (HCC) 07/15/2011   Venous insufficiency 04/29/2011   DLBCL  (diffuse large B cell lymphoma) (HCC) 02/28/2009   Mixed hyperlipidemia 02/28/2009   Class 2 severe obesity due to excess calories with serious comorbidity and body mass index (BMI) of 36.0 to 36.9 in adult [facility]) 02/28/2009   DIASTOLIC HEART FAILURE, CHRONIC 02/28/2009   EDEMA 02/28/2009    Orientation RESPIRATION BLADDER Height & Weight     Self, Time, Situation, Place  O2 (2L) Continent Weight: 199 lb 8.3 oz (90.5 kg) Height:  5\' 11"  (180.3 cm)  BEHAVIORAL SYMPTOMS/MOOD NEUROLOGICAL BOWEL NUTRITION STATUS      Continent    AMBULATORY STATUS COMMUNICATION OF NEEDS Skin   Extensive Assist Verbally Normal                       Personal Care Assistance Level of Assistance  Bathing, Feeding, Dressing Bathing Assistance: Limited assistance Feeding assistance: Independent Dressing Assistance: Limited assistance     Functional Limitations Info  Sight, Speech, Hearing Sight Info: Impaired Hearing Info: Impaired Speech Info: Adequate    SPECIAL CARE FACTORS FREQUENCY  PT (By licensed PT), OT (By licensed OT)     PT Frequency: 5 times weekly OT Frequency: 5 times weekly            Contractures Contractures Info: Not present    Additional Factors Info  Code Status, Allergies Code Status Info: FULL Allergies Info: Penicillins           Current Medications (07/13/2023):  This is the current hospital active medication list Current Facility-Administered Medications  Medication Dose Route Frequency Provider Last Rate Last  Admin   acetaminophen (TYLENOL) tablet 1,000 mg  1,000 mg Oral Q6H Tat, Onalee Hua, MD   1,000 mg at 07/13/23 0538   albuterol (PROVENTIL) (2.5 MG/3ML) 0.083% nebulizer solution 2.5 mg  2.5 mg Nebulization Q2H PRN Pappayliou, [name] A, DO       apixaban (ELIQUIS) tablet 5 mg  5 mg Oral BID Tat, Onalee Hua, MD   5 mg at 07/13/23 0854   bisacodyl (DULCOLAX) suppository 10 mg  10 mg Rectal Daily PRN Pappayliou, [name] A, DO       feeding supplement (ENSURE  ENLIVE / ENSURE PLUS) liquid 237 mL  237 mL Oral TID BM Pappayliou, [name] A, DO       hydrALAZINE (APRESOLINE) injection 5 mg  5 mg Intravenous Q4H PRN Pappayliou, [name] A, DO       metoprolol tartrate (LOPRESSOR) injection 2.5 mg  2.5 mg Intravenous Q6H Tat, Onalee Hua, MD   2.5 mg at 07/13/23 0538   morphine (PF) 2 MG/ML injection 1 mg  1 mg Intravenous Q3H PRN Pappayliou, [name] A, DO       oxyCODONE (Oxy IR/ROXICODONE) immediate release tablet 5 mg  5 mg Oral Q4H PRN Tat, David, MD       polyethylene glycol (MIRALAX / GLYCOLAX) packet 17 g  17 g Oral Daily Tat, David, MD   17 g at 07/13/23 4098   polyvinyl alcohol (LIQUIFILM TEARS) 1.4 % ophthalmic solution 1 drop  1 drop Both Eyes PRN Tat, Onalee Hua, MD       prochlorperazine (COMPAZINE) injection 10 mg  10 mg Intravenous Q4H PRN Pappayliou, [name] A, DO   10 mg at 07/12/23 1144   simethicone (MYLICON) chewable tablet 80 mg  80 mg Oral QID PRN Catarina Hartshorn, MD         Discharge Medications: Please see discharge summary for a list of discharge medications.  Relevant Imaging Results:  Relevant Lab Results:   Additional Information SSN: [id number] [place]

## 2023-07-13 NOTE — Progress Notes (Signed)
Ate pudding and half of thick soup with no complaints.

## 2023-07-13 NOTE — Progress Notes (Signed)
Nutrition Follow-up  DOCUMENTATION CODES:   Non-severe (moderate) malnutrition in context of acute illness/injury  INTERVENTION:   Diet advancement per MD. Ensure Enlive po TID, each supplement provides 350 kcal and 20 grams of protein.  NUTRITION DIAGNOSIS:   Moderate Malnutrition related to acute illness (GOO) as evidenced by mild muscle depletion, mild fat depletion.  Ongoing   GOAL:   Patient will meet greater than or equal to 90% of their needs  Unmet on clear liquids  MONITOR:   Diet advancement  REASON FOR ASSESSMENT:   Malnutrition Screening Tool    ASSESSMENT:   72 yo male admitted with GOO with concern for gastric malignancy. PMH includes iron deficiency anemia, prostate cancer, B-cell lymphoma, DM-2, HLD, HTN, HF.  S/P palliative gastrojejunostomy tube placement 11/13. Diet advanced to clear liquids yesterday. Patient has been consuming small amounts and has not had any N/V. He had a small BM yesterday. Patient states that the broths are too salty, so he hasn't been drinking them. He has mostly been drinking coffee and water. He requested cream soups and Ensure supplements. Per discussion with surgeon, plans to allow patient one more meal of clear liquids (lunch), then advance to full liquids for dinner today, but okay for patient to have Ensure supplements now.   Labs reviewed.  CBG: [id number]  Medications reviewed and include Miralax  Diet Order:   Diet Order             Diet clear liquid Fluid consistency: Thin  Diet effective now                   EDUCATION NEEDS:   No education needs have been identified at this time  Skin:  Skin Assessment: Reviewed RN Assessment  Last BM:  11/18  Height:   Ht Readings from Last 1 Encounters:  07/01/23 5\' 11"  (1.803 m)    Weight:   Wt Readings from Last 1 Encounters:  07/01/23 90.5 kg    Ideal Body Weight:  78.2 kg  BMI:  Body mass index is 27.83 kg/m.  Estimated Nutritional Needs:    Kcal:  2250-2500  Protein:  120-140 gm  Fluid:  2.3-2.5 L   Gabriel Rainwater RD, LDN, CNSC Please refer to Amion for contact information.

## 2023-07-13 NOTE — Plan of Care (Signed)
  Problem: Acute Rehab PT Goals(only PT should resolve) Goal: Pt Will Go Supine/Side To Sit Outcome: Progressing Flowsheets (Taken 07/13/2023 1454) Pt will go Supine/Side to Sit:  with contact guard assist  with minimal assist Goal: Patient Will Transfer Sit To/From Stand Outcome: Progressing Flowsheets (Taken 07/13/2023 1454) Patient will transfer sit to/from stand:  with contact guard assist  with minimal assist Goal: Pt Will Transfer Bed To Chair/Chair To Bed Outcome: Progressing Flowsheets (Taken 07/13/2023 1454) Pt will Transfer Bed to Chair/Chair to Bed:  with contact guard assist  with min assist Goal: Pt Will Ambulate Outcome: Progressing Flowsheets (Taken 07/13/2023 1454) Pt will Ambulate:  50 feet  with contact guard assist  with minimal assist  with rolling walker   2:54 PM, 07/13/23 [name], MPT Physical Therapist with [facility] [phone] office [phone] mobile phone

## 2023-07-13 NOTE — Progress Notes (Signed)
PROGRESS NOTE  [name]. VQM:[id number] DOB: Apr 05, 1951 DOA: 07/01/2023 PCP: [name], MD  Brief History:  72 y.o. male,  with medical history significant for hypertension, atrial flutter on Eliquis, diastolic heart failure, diabetes mellitus type 2, diffuse large B-cell lymphoma status post stem cell transplant 2007, iron deficiency anemia, recent diagnosis of prostate cancer, status postradiation. -Patient presents to ED secondary to complaints of early satiety, bloating, poor appetite and oral intake, belching, nausea, but no vomiting, as well he does report bloating, he denies fever, chills, he reports significant weight loss due to poor oral intake from his GI symptoms. -In ED workup significant for creatinine at 1.34 which is around her baseline, hemoglobin stable at 11.5, white blood cell count at 3.8, patient had CT abdomen pelvis done which was significant for irregular thickening throughout the gastric antrum concerning for gastric carcinoma versus gastric lymphoma, with an element of gastric outlet obstruction with surrounding lymphadenopathy, possible carcinomatosis.   Assessment/Plan: Gastric output obstruction due to gastric adenocarcinoma Early satiety/weight loss Failure to thrive -07/01/23 CT abdomen pelvis with concern of gastric outlet obstruction, with malignancy and lymphadenopathy -11/8 endoscopy by GI with Dr. 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  -Dr Ellin Saba met with patient and family to discuss treatment plan on 11/11.  Pt wants to pursue chemotherapy.  -07/07/23--gastrojejunostomy--Dr.  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  -restart apixaban 11/17 -UGI study 11/18---Patent gastrojejunostomy anastomosis without evidence of stricture or contrast extravasation --11/18--start clear liquid diet>>full liquids on 11/19   Acute on chronic renal failure--CKD 3a  -baseline creatinine 1.1-1.4 -suspect a component of contrast nephropathy -continued IVF when pt was npo -improved with IVF   Hypoglycemia -due to npo status -added D5W to NS -am cortisol--17.6 -now on diet>>monitor CBGs   Superficial vein thrombus--cephalic vein -local care   History of autoimmune hemolytic anemia -Hgb  is stable   Bilateral pleural effusion -Patient is currently on room air.   History of prostate cancer -Patient received radiation therapy earlier this year, per him and his wife report his PSA has been within normal limit, he is following with urology   History of Diffuse large B-cell lymphoma: Diagnosed in November 2006, treated with R-CHOP chemotherapy with incomplete response.  He is followed by Dr [name].    Atrial flutter on Eliquis, rate controlled - restart apixaban 11/17 - PTA not on AV blocking agents. - continue IV lopressor for now that was started on 11/9   DM2, not on insulin -no longer on therapy   02/12/20 A1C 8.2 -06/16/23 A1C 4.6   HLD Resume statin when able to take oral consistently   Essential HTN -previously on metoprolol -IV lopressor until able to tolerate po   Hypophosphatemia -repeted       Family Communication:   sister and spouse updated 11/19   Consultants:  general surgery   Code Status:  FULL    DVT Prophylaxis:  apixaban on hold     Procedures: As Listed in Progress Note Above   Antibiotics: None      Subjective: Pt had 2 BMs today.  Denies emesis.  Feels nauseous.  Denies f/c, cp, sob,  abd pain  Objective: Vitals:   07/12/23 2342 07/13/23 0401 07/13/23  1225 07/13/23 1550  BP: 128/73 119/66 98/60 130/68  Pulse: 84 85 95 73  Resp:  19 16   Temp:  98.4 F (36.9 C) (!) 97.5 F (36.4 C)   TempSrc:  Oral    SpO2:  100%    Weight:      Height:        Intake/Output Summary (Last 24 hours) at 07/13/2023 1620 Last data filed at 07/13/2023 0900 Gross per 24 hour  Intake 331.4 ml  Output --  Net 331.4 ml   Weight change:  Exam:  General:  Pt is alert, follows commands appropriately, not in acute distress HEENT: No icterus, No thrush, No neck mass, [place]/AT Cardiovascular: RRR, S1/S2, no rubs, no gallops Respiratory: CTA bilaterally, no wheezing, no crackles, no rhonchi Abdomen: Soft/+BS, non tender, non distended, no guarding Extremities: trace LE edema, No lymphangitis, No petechiae, No rashes, no synovitis   Data Reviewed: I have personally reviewed following labs and imaging studies Basic Metabolic Panel: Recent Labs  Lab 07/07/23 0418 07/08/23 0443 07/09/23 0406 07/10/23 0416 07/11/23 0431 07/13/23 0442  NA 138 140 141 143 141 140  K 3.5 4.4 4.0 3.9 3.7 3.8  CL 109 106 107 109 106 104  CO2 25 23 24 24 25 25   GLUCOSE 94 109* 82 77 117* 95  BUN 9 12 15 15 11 11   CREATININE 1.78* 1.96* 1.80* 1.32* 1.09 1.02  CALCIUM 7.9* 8.1* 8.3* 8.1* 8.0* 8.3*  MG 1.7 1.7  --  1.8 1.9 1.6*  PHOS  --   --   --   --  1.7* 2.1*   Liver Function Tests: Recent Labs  Lab 07/07/23 0418  AST 11*  ALT 8  ALKPHOS 41  BILITOT 0.6  PROT 4.8*  ALBUMIN 2.5*   No results for input(s): "LIPASE", "AMYLASE" in the last 168 hours. No results for input(s): "AMMONIA" in the last 168 hours. Coagulation Profile: No results for input(s): "INR", "PROTIME" in the last 168 hours. CBC: Recent Labs  Lab 07/07/23 0418 07/08/23 0443 07/09/23 0406 07/13/23 0442  WBC 3.7* 8.0 6.5 7.2  HGB 10.0* 10.6* 9.6* 10.7*  HCT 31.7* 34.8* 31.3* 34.8*  MCV 96.6 98.0 99.1 97.2  PLT 147* 155 145* 120*   Cardiac Enzymes: No results for input(s): "CKTOTAL",  "CKMB", "CKMBINDEX", "TROPONINI" in the last 168 hours. BNP: Invalid input(s): "POCBNP" CBG: Recent Labs  Lab 07/12/23 2001 07/13/23 0000 07/13/23 0400 07/13/23 0722 07/13/23 1114  GLUCAP 94 97 101* 87 104*   HbA1C: No results for input(s): "HGBA1C" in the last 72 hours. Urine analysis:    Component Value Date/Time   COLORURINE YELLOW 07/09/2023 1840   APPEARANCEUR HAZY (A) 07/09/2023 1840   APPEARANCEUR Clear 05/03/2023 1056   LABSPEC 1.020 07/09/2023 1840   PHURINE 5.0 07/09/2023 1840   GLUCOSEU NEGATIVE 07/09/2023 1840   HGBUR SMALL (A) 07/09/2023 1840   BILIRUBINUR NEGATIVE 07/09/2023 1840   BILIRUBINUR Negative 05/03/2023 1056   KETONESUR 5 (A) 07/09/2023 1840   PROTEINUR 100 (A) 07/09/2023 1840   NITRITE NEGATIVE 07/09/2023 1840   LEUKOCYTESUR NEGATIVE 07/09/2023 1840   Sepsis Labs: @LABRCNTIP (procalcitonin:4,lacticidven:4) ) Recent Results (from the past 240 hour(s))  Surgical pcr screen     Status: None   Collection Time: 07/06/23  8:17 PM   Specimen: Nasal Mucosa; Nasal Swab  Result Value Ref Range Status   MRSA, PCR NEGATIVE NEGATIVE Final   Staphylococcus aureus NEGATIVE  NEGATIVE Final    Comment: (NOTE) The Xpert SA Assay (FDA approved for NASAL specimens in patients 57 years of age and older), is one component of a comprehensive surveillance program. It is not intended to diagnose infection nor to guide or monitor treatment. Performed at [facility], [address], [place], [place] [address]      Scheduled Meds:  acetaminophen  1,000 mg Oral Q6H   apixaban  5 mg Oral BID   feeding supplement  237 mL Oral TID BM   metoprolol tartrate  2.5 mg Intravenous Q6H   polyethylene glycol  17 g Oral Daily   Continuous Infusions:  Procedures/Studies: DG UGI W SINGLE CM (SOL OR THIN BA)  Result Date: 07/12/2023 CLINICAL DATA:  History of gastric outlet obstruction, post surgical creation of a gastrojejunostomy. EXAM: DG UGI W SINGLE CM TECHNIQUE:  Single contrast examination was then performed using water-soluble contrast. FLUOROSCOPY: 2 minutes (33.5 mGy) COMPARISON:  CT abdomen and pelvis-07/01/2023 FINDINGS: Patient was positioned supine on the fluoroscopy table Preprocedural spot fluoroscopic image was obtained of the upper abdomen demonstrate enteric tube tip and side port project over the expected location of the gastric fundus. Midline skin staples. Water-soluble contrast was then administered via the nasogastric tube with opacification of the gastric lumen. There is a moderate-to-large amount of reflux noted to the level of the mid/distal esophagus. Ultimately, there is passage of contrast from the stomach lumen through the anastomosis with the opacification of the small bowel. Passage of contrast was improved by positioning on the fluoroscopy table at a 45 degree angle. No discrete areas of contrast extravasation are identified. There is minimal passage of contrast through the native gastric antrum to the level of the descending portion of the duodenum. IMPRESSION: 1. Patent gastrojejunostomy anastomosis without evidence of stricture or contrast extravasation. Passage of contrast through the anastomosis with sluggish though improved with positioning of the patient had a 45 degree angle. 2. Moderate-to-large amount of reflux to the level of the mid esophagus. Electronically Signed   By: Simonne Come M.D.   On: 07/12/2023 12:01   US Venous Img Upper Uni Left (DVT)  Result Date: 07/10/2023 CLINICAL DATA:  Left upper extremity pain and edema. Evaluate for DVT. EXAM: LEFT UPPER EXTREMITY VENOUS DOPPLER ULTRASOUND TECHNIQUE: Gray-scale sonography with graded compression, as well as color Doppler and duplex ultrasound were performed to evaluate the upper extremity deep venous system from the level of the subclavian vein and including the jugular, axillary, basilic, radial, ulnar and upper cephalic vein. Spectral Doppler was utilized to evaluate flow at  rest and with distal augmentation maneuvers. COMPARISON:  None Available. FINDINGS: Contralateral Subclavian Vein: Respiratory phasicity is normal and symmetric with the symptomatic side. No evidence of thrombus. Normal compressibility. Internal Jugular Vein: No evidence of thrombus. Normal compressibility, respiratory phasicity and response to augmentation. Subclavian Vein: No evidence of thrombus. Normal compressibility, respiratory phasicity and response to augmentation. Axillary Vein: No evidence of thrombus. Normal compressibility, respiratory phasicity and response to augmentation. Cephalic Vein: While the cephalic vein appears patent at the level of the left upper arm (image 23), there is hypoechoic occlusive thrombus within the cephalic vein at the level of the antecubital fossa (image 26). The cephalic vein appears patent at the level of the wrist (image 27). Basilic Vein: No evidence of thrombus. Normal compressibility, respiratory phasicity and response to augmentation. Brachial Veins: No evidence of thrombus. Normal compressibility, respiratory phasicity and response to augmentation. Radial Veins: No evidence of thrombus. Normal compressibility,  respiratory phasicity and response to augmentation. Ulnar Veins: No evidence of thrombus. Normal compressibility, respiratory phasicity and response to augmentation. Other Findings: There is a moderate amount of subcutaneous edema at the level of the left forearm (images 32 through 34). IMPRESSION: 1. Occlusive thrombus within the cephalic vein at the level of the antecubital fossa. 2. No evidence of DVT within the left upper extremity. Electronically Signed   By: Simonne Come M.D.   On: 07/10/2023 16:22   CT ABDOMEN PELVIS W CONTRAST  Result Date: 07/01/2023 CLINICAL DATA:  Abnormal bowel movements for 1 month, nausea, unintentional weight loss, prior history of lymphoma EXAM: CT ABDOMEN AND PELVIS WITH CONTRAST TECHNIQUE: Multidetector CT imaging of the  abdomen and pelvis was performed using the standard protocol following bolus administration of intravenous contrast. RADIATION DOSE REDUCTION: This exam was performed according to the departmental dose-optimization program which includes automated exposure control, adjustment of the mA and/or kV according to patient size and/or use of iterative reconstruction technique. CONTRAST:  OMNIPAQUE IOHEXOL 300 MG/ML  SOLN COMPARISON:  08/04/2019 FINDINGS: Lower chest: There are small bilateral pleural effusions. New bilateral pleural calcifications could reflect sequela of prior pleurodesis or trauma. No acute airspace disease. Chronic scarring at the right lung base. Hepatobiliary: No focal liver abnormality is seen. No gallstones, gallbladder wall thickening, or biliary dilatation. Pancreas: Unremarkable. No pancreatic ductal dilatation or surrounding inflammatory changes. Spleen: Normal in size without focal abnormality. Adrenals/Urinary Tract: Adrenal glands are unremarkable. Kidneys are normal, without renal calculi, focal lesion, or hydronephrosis. Bladder is unremarkable. Stomach/Bowel: There is irregular wall thickening of the gastric antrum and pylorus, measuring up to 19 mm in maximal thickness. This likely results in an element of gastric outlet obstruction, with distension of the proximal stomach. Differential would include gastric lymphoma versus adenocarcinoma. Endoscopy is recommended for further evaluation. No other signs of bowel obstruction or ileus. Mild fecal retention throughout the colon. Vascular/Lymphatic: Numerous subcentimeter lymph nodes surround the gastric antral wall thickening, measuring up to 9 mm in short axis reference image 37/2. Additionally, there are numerous mesenteric nodules throughout the right upper quadrant which could reflect additional mesenteric adenopathy versus peritoneal carcinomatosis. Largest area measures 2.4 x 2.9 cm reference image 39/2. There is diffuse  atherosclerosis of the aorta and its branches. Incidental 1 cm aneurysm extending inferiorly from the main right renal artery, reference image 58/4, not significantly changed. Reproductive: Prostate is unremarkable. Fiduciary markers are seen within the prostate. Other: Trace free fluid within the lower pelvis. No free intraperitoneal gas. No abdominal wall hernia. Musculoskeletal: There are no acute or destructive bony abnormalities. Postsurgical changes are again noted within the midthoracic spine. Reconstructed images demonstrate no additional findings. IMPRESSION: 1. New irregular mural thickening throughout the gastric antrum, concerning for gastric carcinoma versus gastric lymphoma. Endoscopy is recommended for further evaluation. The mural thickening likely results in an element of gastric outlet obstruction, with distension of the proximal stomach. 2. Numerous subcentimeter lymph nodes surrounding the thickened gastric antrum, as well as numerous mesenteric nodules throughout the right upper quadrant, consistent with lymphadenopathy and carcinomatosis. 3. Small bilateral pleural effusions, with new pleural calcifications bilaterally likely sequela of interval pleurodesis or trauma. 4. 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 5.  Aortic Atherosclerosis (ICD10-I70.0). Electronically Signed   By: Sharlet Salina M.D.   On: 07/01/2023 20:12    Catarina Hartshorn, DO  [facility]  If 7PM-7AM, please contact night-coverage [email or web address] Password [email or web address] 07/13/2023, 4:20 PM   LOS:  12 days

## 2023-07-13 NOTE — Progress Notes (Signed)
   07/13/23 1633  Spiritual Encounters  Type of Visit Initial  Care provided to: Pt and family  Referral source Chaplain team  Reason for visit Routine spiritual support  OnCall Visit No  Spiritual Framework  Presenting Themes Goals in life/care;Coping tools;Courage hope and growth;Meaning/purpose/sources of inspiration  Values/beliefs Christian Faith  Community/Connection Family;Faith community  Strengths faith, hope, genuinly positive outlook, strong spousaul support  Needs/Challenges/Barriers recent grief  Patient Stress Factors Exhausted;Loss  Family Stress Factors None identified  Interventions  Spiritual Care Interventions Made Established relationship of care and support;Reflective listening;Narrative/life review;Explored values/beliefs/practices/strengths;Prayer  Intervention Outcomes  Outcomes Connection to spiritual care   Chaplain visited with Pt and his wife for approximately 30 minutes.  Facilitated health history review and assessed that  Pt is in a place of confidence about his current health crisis.  Chaplain did discvoer that Pt has suffered 2 recent losses, one of them his father.  Pt understands that he has set the grieving process aside in order to address his helath goals for the time being.  He understands that the grief will need to be dealt with when his health is not in such a precarious place.  Chaplain prayed with Pt when prayer was requested.  No further spiritual care needs at this time.  Chaplain Raelene Bott [email or web address]

## 2023-07-14 DIAGNOSIS — I700 Atherosclerosis of aorta: Secondary | ICD-10-CM | POA: Diagnosis not present

## 2023-07-14 DIAGNOSIS — K311 Adult hypertrophic pyloric stenosis: Secondary | ICD-10-CM | POA: Diagnosis not present

## 2023-07-14 DIAGNOSIS — I10 Essential (primary) hypertension: Secondary | ICD-10-CM

## 2023-07-14 DIAGNOSIS — K3189 Other diseases of stomach and duodenum: Secondary | ICD-10-CM | POA: Diagnosis not present

## 2023-07-14 DIAGNOSIS — I5032 Chronic diastolic (congestive) heart failure: Secondary | ICD-10-CM | POA: Diagnosis not present

## 2023-07-14 LAB — BASIC METABOLIC PANEL
Anion gap: 10 (ref 5–15)
BUN: 14 mg/dL (ref 8–23)
CO2: 25 mmol/L (ref 22–32)
Calcium: 8.4 mg/dL — ABNORMAL LOW (ref 8.9–10.3)
Chloride: 104 mmol/L (ref 98–111)
Creatinine, Ser: 1.23 mg/dL (ref 0.61–1.24)
GFR, Estimated: >60 mL/min (ref >60)
Glucose, Bld: 97 mg/dL (ref 70–99)
Potassium: 3.5 mmol/L (ref 3.5–5.1)
Sodium: 139 mmol/L (ref 135–145)

## 2023-07-14 LAB — GLUCOSE, CAPILLARY
Glucose-Capillary: 105 mg/dL — ABNORMAL HIGH (ref 70–99)
Glucose-Capillary: 103 mg/dL — ABNORMAL HIGH (ref 70–99)
Glucose-Capillary: 104 mg/dL — ABNORMAL HIGH (ref 70–99)
Glucose-Capillary: 99 mg/dL (ref 70–99)
Glucose-Capillary: 104 mg/dL — ABNORMAL HIGH (ref 70–99)
Glucose-Capillary: 105 mg/dL — ABNORMAL HIGH (ref 70–99)

## 2023-07-14 LAB — MAGNESIUM: Magnesium: 2.1 mg/dL (ref 1.7–2.4)

## 2023-07-14 NOTE — Plan of Care (Signed)
°  Problem: Clinical Measurements: Goal: Ability to maintain clinical measurements within normal limits will improve Outcome: Not Progressing   Problem: Nutrition: Goal: Adequate nutrition will be maintained Outcome: Not Progressing   Problem: Safety: Goal: Ability to remain free from injury will improve Outcome: Not Progressing

## 2023-07-14 NOTE — Progress Notes (Signed)
Physical Therapy Treatment Patient Details Name: [name]. MRN: [id number] DOB: 1951/05/13 Today's Date: 07/14/2023   History of Present Illness Stephanie Touhey  is a 71 y.o. male,  with medical history significant for hypertension, atrial flutter on Eliquis, diastolic heart failure, diabetes mellitus type 2, diffuse large B-cell lymphoma status post stem cell transplant 2007, iron deficiency anemia, recent diagnosis of prostate cancer, status postradiation .  -Patient presents to ED secondary to complaints of early satiety, bloating, poor appetite and oral intake, belching, nausea, but no vomiting, as well he does report bloating, he denies fever, chills, he reports significant weight loss due to poor oral intake from his GI symptoms.  -In ED workup significant for creatinine at 1.34 which is around her baseline, hemoglobin stable at 11.5, white blood cell count at 3.8, patient had CT abdomen pelvis done which was significant for irregular thickening throughout the gastric antrum concerning for gastric carcinoma versus gastric lymphoma, with an element of gastric outlet obstruction with surrounding lymphadenopathy, possible carcinomatosis.    PT Comments  Patient demonstrates slow labored movement for sitting up at bedside requiring increased time for scooting to EOB, fair/good return for completing BLE ROM/strengthening exercises while seated at bedside and limited to a few side steps due c/o increased fatigue and weakness after having sat up in chair in AM.  Patient put back to bed with Mod assist to reposition.  Patient will benefit from continued skilled physical therapy in hospital and recommended venue below to increase strength, balance, endurance for safe ADLs and gait.       If plan is discharge home, recommend the following: A lot of help with bathing/dressing/bathroom;A lot of help with walking and/or transfers;Help with stairs or ramp for entrance;Assistance with cooking/housework   Can  travel by private vehicle     Yes  Equipment Recommendations  None recommended by PT    Recommendations for Other Services       Precautions / Restrictions Precautions Precautions: Fall Restrictions Weight Bearing Restrictions: No     Mobility  Bed Mobility Overal bed mobility: Needs Assistance Bed Mobility: Supine to Sit, Sit to Supine     Supine to sit: Min assist Sit to supine: Mod assist   General bed mobility comments: required assistance for scooting to EOB and moving legs when getting back to bed    Transfers Overall transfer level: Needs assistance Equipment used: Rolling walker (2 wheels) Transfers: Sit to/from Stand Sit to Stand: Mod assist           General transfer comment: Increased time, labored movement due to c/o fatigue and weakness    Ambulation/Gait Ambulation/Gait assistance: Mod assist Gait Distance (Feet): 3 Feet Assistive device: Rolling walker (2 wheels) Gait Pattern/deviations: Decreased step length - right, Decreased step length - left, Decreased stride length, Trunk flexed Gait velocity: slow     General Gait Details: limited to a few slow labored side steps at bedside due to c/o fatigue, weakness   Stairs             Wheelchair Mobility     Tilt Bed    Modified Rankin (Stroke Patients Only)       Balance Overall balance assessment: Needs assistance Sitting-balance support: Feet supported, No upper extremity supported Sitting balance-Leahy Scale: Fair Sitting balance - Comments: fair/good seated at EOB   Standing balance support: Reliant on assistive device for balance, During functional activity, Bilateral upper extremity supported Standing balance-Leahy Scale: Poor Standing balance comment: fair/poor using RW  Cognition Arousal: Alert Behavior During Therapy: WFL for tasks assessed/performed Overall Cognitive Status: Within Functional Limits for tasks assessed                                           Exercises General Exercises - Lower Extremity Long Arc Quad: Seated, AROM, Strengthening, Both, 10 reps Hip Flexion/Marching: Seated, AROM, Strengthening, Both, 10 reps Toe Raises: Seated, AROM, Strengthening, Both, 10 reps Heel Raises: Seated, AROM, Strengthening, Both, 10 reps    General Comments        Pertinent Vitals/Pain Pain Assessment Pain Assessment: Faces Faces Pain Scale: Hurts a little bit Pain Location: stomach Pain Descriptors / Indicators: Aching Pain Intervention(s): Limited activity within patient's tolerance, Monitored during session, Repositioned    Home Living                          Prior Function            PT Goals (current goals can now be found in the care plan section) Acute Rehab PT Goals Patient Stated Goal: return home with family to assist PT Goal Formulation: With patient Time For Goal Achievement: 07/27/23 Potential to Achieve Goals: Good Progress towards PT goals: Progressing toward goals    Frequency    Min 3X/week      PT Plan      Co-evaluation              AM-PAC PT "6 Clicks" Mobility   Outcome Measure  Help needed turning from your back to your side while in a flat bed without using bedrails?: A Little Help needed moving from lying on your back to sitting on the side of a flat bed without using bedrails?: A Little Help needed moving to and from a bed to a chair (including a wheelchair)?: A Lot Help needed standing up from a chair using your arms (e.g., wheelchair or bedside chair)?: A Lot Help needed to walk in hospital room?: A Lot Help needed climbing 3-5 steps with a railing? : A Lot 6 Click Score: 14    End of Session   Activity Tolerance: Patient tolerated treatment well;Patient limited by fatigue Patient left: in bed;with call bell/phone within reach Nurse Communication: Mobility status PT Visit Diagnosis: Unsteadiness on feet (R26.81);Other  abnormalities of gait and mobility (R26.89);Muscle weakness (generalized) (M62.81)     Time: 5284-1324 PT Time Calculation (min) (ACUTE ONLY): 31 min  Charges:    $Therapeutic Exercise: 8-22 mins $Therapeutic Activity: 8-22 mins PT General Charges $$ ACUTE PT VISIT: 1 Visit                     3:00 PM, 07/14/23 Ocie Bob, MPT Physical Therapist with [facility] [phone] office [phone] mobile phone

## 2023-07-14 NOTE — Progress Notes (Signed)
PROGRESS NOTE  [name]. WUJ:[id number] DOB: 02-04-1951 DOA: 07/01/2023 PCP: [name], MD  Brief History:  No notes on file   Assessment/Plan: Gastric output obstruction due to gastric adenocarcinoma Early satiety/weight loss Failure to thrive -07/01/23 CT abdomen pelvis with concern of gastric outlet obstruction, with malignancy and lymphadenopathy -11/8 endoscopy by GI with Dr. 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  -Dr Ellin Saba met with patient and family to discuss treatment plan on 11/11.  Pt wants to pursue chemotherapy.  -07/07/23--gastrojejunostomy--Dr. 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 -Per general surgery continue full liquid diet until outpatient follow-up. -Discharge in the next 24 to 48 hours.   Acute on chronic renal failure--CKD 3a  -baseline creatinine 1.1-1.4 -suspect a component of contrast nephropathy -Renal function has improved, stabilized and back to baseline. -Creatinine/renal function trend intermittently.   Hypoglycemia -Stabilized and corrected -Patient cortisol 17.6 -CBGs. -   Superficial vein thrombus--cephalic vein -Continue local care -Patient is chronically on Eliquis.   History of autoimmune hemolytic anemia -Hgb  is stable -Follow trend.  No overt bleeding appreciated.   Bilateral pleural effusion -Patient is  currently on room air. -Good saturation appreciated.  Denies shortness of breath.   History of prostate cancer -Patient received radiation therapy earlier this year, per him and his wife report his PSA has been within normal limit, he is following with urology as an outpatient.   History of Diffuse large B-cell lymphoma: Diagnosed in November 2006, treated with R-CHOP chemotherapy with incomplete response.  He is followed by Dr [name].    Atrial flutter on Eliquis, rate controlled -Continue Lopressor -Continue Eliquis.   DM2, not on insulin -no longer on therapy   02/12/20 A1C 8.2 -Most recent A1C 4.6 in October 2024 -Continue to follow CBG fluctuation, not requiring therapy at the moment.   HLD -Planning to resume statin when able to take oral consistently   Essential HTN -Continue metoprolol; IV at the moment until fully demonstrating good tolerance of oral intake.   Hypophosphatemia -Continue to follow ultralights trend And further replete as needed       Family Communication:   sister and spouse updated 11/19   Consultants:  general surgery   Code Status:  FULL    DVT Prophylaxis:  apixaban on hold     Procedures: As Listed in Progress Note Above   Antibiotics: None  Subjective: No chest pain, no nausea, no vomiting.  Patient reports feeling improved and just generally weak.  Positive bowel movements.  Tolerating full liquid diet.  Objective: Vitals:   07/13/23 2332 07/14/23 0330 07/14/23 1212 07/14/23 1326  BP: (!) 141/70 139/70 127/68 125/67  Pulse: 82 80  83  Resp: 18 18  18   Temp: 97.8 F (36.6 C) 97.7 F (36.5 C)  (!) 97.5 F (36.4 C)  TempSrc: Oral Oral  Oral  SpO2: 100% 99%  100%  Weight:      Height:        Intake/Output Summary (Last 24 hours) at  07/14/2023 1704 Last data filed at 07/14/2023 1500 Gross per 24 hour  Intake 579.25 ml  Output 400 ml  Net 179.25 ml   Weight change:   Exam: General exam: Alert, awake, oriented x 3;  afebrile, no chest pain, no nausea, no vomiting.  Reports tolerating full liquid diet and having bowel movements. Respiratory system: Good saturation on room air; no using accessory muscle. Cardiovascular system: No rubs, no gallops, no JVD. Gastrointestinal system: Abdomen is nondistended, soft and nontender.  Positive bowel sounds appreciated. Central nervous system: Alert and oriented. No focal neurological deficits. Extremities: No cyanosis or clubbing; trace to 1+ edema appreciated bilaterally (appears to be chronic and at baseline). Skin: No petechiae.  Midline incision in his abdomen appears to be clean dry and intact.  Skin staples in place no suppuration. Psychiatry: Judgement and insight appear normal. Mood & affect appropriate.    Data Reviewed: I have personally reviewed following labs and imaging studies  Basic Metabolic Panel: Recent Labs  Lab 07/08/23 0443 07/09/23 0406 07/10/23 0416 07/11/23 0431 07/13/23 0442 07/14/23 0407  NA 140 141 143 141 140 139  K 4.4 4.0 3.9 3.7 3.8 3.5  CL 106 107 109 106 104 104  CO2 23 24 24 25 25 25   GLUCOSE 109* 82 77 117* 95 97  BUN 12 15 15 11 11 14   CREATININE 1.96* 1.80* 1.32* 1.09 1.02 1.23  CALCIUM 8.1* 8.3* 8.1* 8.0* 8.3* 8.4*  MG 1.7  --  1.8 1.9 1.6* 2.1  PHOS  --   --   --  1.7* 2.1*  --    CBC: Recent Labs  Lab 07/08/23 0443 07/09/23 0406 07/13/23 0442  WBC 8.0 6.5 7.2  HGB 10.6* 9.6* 10.7*  HCT 34.8* 31.3* 34.8*  MCV 98.0 99.1 97.2  PLT 155 145* 120*   CBG: Recent Labs  Lab 07/13/23 1922 07/13/23 2329 07/14/23 0320 07/14/23 0729 07/14/23 1112  GLUCAP 103* 104* 99 104* 105*   Urine analysis:    Component Value Date/Time   COLORURINE YELLOW 07/09/2023 1840   APPEARANCEUR HAZY (A) 07/09/2023 1840   APPEARANCEUR Clear 05/03/2023 1056   LABSPEC 1.020 07/09/2023 1840   PHURINE 5.0 07/09/2023 1840   GLUCOSEU NEGATIVE 07/09/2023 1840   HGBUR SMALL (A) 07/09/2023 1840   BILIRUBINUR NEGATIVE 07/09/2023  1840   BILIRUBINUR Negative 05/03/2023 1056   KETONESUR 5 (A) 07/09/2023 1840   PROTEINUR 100 (A) 07/09/2023 1840   NITRITE NEGATIVE 07/09/2023 1840   LEUKOCYTESUR NEGATIVE 07/09/2023 1840   Sepsis Labs: @LABRCNTIP (procalcitonin:4,lacticidven:4) ) Recent Results (from the past 240 hour(s))  Surgical pcr screen     Status: None   Collection Time: 07/06/23  8:17 PM   Specimen: Nasal Mucosa; Nasal Swab  Result Value Ref Range Status   MRSA, PCR NEGATIVE NEGATIVE Final   Staphylococcus aureus NEGATIVE NEGATIVE Final    Comment: (NOTE) The Xpert SA Assay (FDA approved for NASAL specimens in patients 58 years of age and older), is one component of a comprehensive surveillance program. It is not intended to diagnose infection nor to guide or monitor treatment. Performed at [facility], [address], [place], [place] [address]      Scheduled Meds:  acetaminophen  1,000 mg Oral Q6H   apixaban  5 mg Oral BID   feeding supplement  237 mL Oral TID BM   metoprolol tartrate  2.5 mg Intravenous Q6H   polyethylene glycol  17 g Oral Daily   prochlorperazine  5 mg Intravenous TID AC  Continuous Infusions:  Procedures/Studies: DG UGI W SINGLE CM (SOL OR THIN BA)  Result Date: 07/12/2023 CLINICAL DATA:  History of gastric outlet obstruction, post surgical creation of a gastrojejunostomy. EXAM: DG UGI W SINGLE CM TECHNIQUE: Single contrast examination was then performed using water-soluble contrast. FLUOROSCOPY: 2 minutes (33.5 mGy) COMPARISON:  CT abdomen and pelvis-07/01/2023 FINDINGS: Patient was positioned supine on the fluoroscopy table Preprocedural spot fluoroscopic image was obtained of the upper abdomen demonstrate enteric tube tip and side port project over the expected location of the gastric fundus. Midline skin staples. Water-soluble contrast was then administered via the nasogastric tube with opacification of the gastric lumen. There is a moderate-to-large amount of reflux noted  to the level of the mid/distal esophagus. Ultimately, there is passage of contrast from the stomach lumen through the anastomosis with the opacification of the small bowel. Passage of contrast was improved by positioning on the fluoroscopy table at a 45 degree angle. No discrete areas of contrast extravasation are identified. There is minimal passage of contrast through the native gastric antrum to the level of the descending portion of the duodenum. IMPRESSION: 1. Patent gastrojejunostomy anastomosis without evidence of stricture or contrast extravasation. Passage of contrast through the anastomosis with sluggish though improved with positioning of the patient had a 45 degree angle. 2. Moderate-to-large amount of reflux to the level of the mid esophagus. Electronically Signed   By: Simonne Come M.D.   On: 07/12/2023 12:01   US Venous Img Upper Uni Left (DVT)  Result Date: 07/10/2023 CLINICAL DATA:  Left upper extremity pain and edema. Evaluate for DVT. EXAM: LEFT UPPER EXTREMITY VENOUS DOPPLER ULTRASOUND TECHNIQUE: Gray-scale sonography with graded compression, as well as color Doppler and duplex ultrasound were performed to evaluate the upper extremity deep venous system from the level of the subclavian vein and including the jugular, axillary, basilic, radial, ulnar and upper cephalic vein. Spectral Doppler was utilized to evaluate flow at rest and with distal augmentation maneuvers. COMPARISON:  None Available. FINDINGS: Contralateral Subclavian Vein: Respiratory phasicity is normal and symmetric with the symptomatic side. No evidence of thrombus. Normal compressibility. Internal Jugular Vein: No evidence of thrombus. Normal compressibility, respiratory phasicity and response to augmentation. Subclavian Vein: No evidence of thrombus. Normal compressibility, respiratory phasicity and response to augmentation. Axillary Vein: No evidence of thrombus. Normal compressibility, respiratory phasicity and response to  augmentation. Cephalic Vein: While the cephalic vein appears patent at the level of the left upper arm (image 23), there is hypoechoic occlusive thrombus within the cephalic vein at the level of the antecubital fossa (image 26). The cephalic vein appears patent at the level of the wrist (image 27). Basilic Vein: No evidence of thrombus. Normal compressibility, respiratory phasicity and response to augmentation. Brachial Veins: No evidence of thrombus. Normal compressibility, respiratory phasicity and response to augmentation. Radial Veins: No evidence of thrombus. Normal compressibility, respiratory phasicity and response to augmentation. Ulnar Veins: No evidence of thrombus. Normal compressibility, respiratory phasicity and response to augmentation. Other Findings: There is a moderate amount of subcutaneous edema at the level of the left forearm (images 32 through 34). IMPRESSION: 1. Occlusive thrombus within the cephalic vein at the level of the antecubital fossa. 2. No evidence of DVT within the left upper extremity. Electronically Signed   By: Simonne Come M.D.   On: 07/10/2023 16:22   CT ABDOMEN PELVIS W CONTRAST  Result Date: 07/01/2023 CLINICAL DATA:  Abnormal bowel movements for 1 month, nausea, unintentional weight loss, prior history of lymphoma  EXAM: CT ABDOMEN AND PELVIS WITH CONTRAST TECHNIQUE: Multidetector CT imaging of the abdomen and pelvis was performed using the standard protocol following bolus administration of intravenous contrast. RADIATION DOSE REDUCTION: This exam was performed according to the departmental dose-optimization program which includes automated exposure control, adjustment of the mA and/or kV according to patient size and/or use of iterative reconstruction technique. CONTRAST:  OMNIPAQUE IOHEXOL 300 MG/ML  SOLN COMPARISON:  08/04/2019 FINDINGS: Lower chest: There are small bilateral pleural effusions. New bilateral pleural calcifications could reflect sequela of prior  pleurodesis or trauma. No acute airspace disease. Chronic scarring at the right lung base. Hepatobiliary: No focal liver abnormality is seen. No gallstones, gallbladder wall thickening, or biliary dilatation. Pancreas: Unremarkable. No pancreatic ductal dilatation or surrounding inflammatory changes. Spleen: Normal in size without focal abnormality. Adrenals/Urinary Tract: Adrenal glands are unremarkable. Kidneys are normal, without renal calculi, focal lesion, or hydronephrosis. Bladder is unremarkable. Stomach/Bowel: There is irregular wall thickening of the gastric antrum and pylorus, measuring up to 19 mm in maximal thickness. This likely results in an element of gastric outlet obstruction, with distension of the proximal stomach. Differential would include gastric lymphoma versus adenocarcinoma. Endoscopy is recommended for further evaluation. No other signs of bowel obstruction or ileus. Mild fecal retention throughout the colon. Vascular/Lymphatic: Numerous subcentimeter lymph nodes surround the gastric antral wall thickening, measuring up to 9 mm in short axis reference image 37/2. Additionally, there are numerous mesenteric nodules throughout the right upper quadrant which could reflect additional mesenteric adenopathy versus peritoneal carcinomatosis. Largest area measures 2.4 x 2.9 cm reference image 39/2. There is diffuse atherosclerosis of the aorta and its branches. Incidental 1 cm aneurysm extending inferiorly from the main right renal artery, reference image 58/4, not significantly changed. Reproductive: Prostate is unremarkable. Fiduciary markers are seen within the prostate. Other: Trace free fluid within the lower pelvis. No free intraperitoneal gas. No abdominal wall hernia. Musculoskeletal: There are no acute or destructive bony abnormalities. Postsurgical changes are again noted within the midthoracic spine. Reconstructed images demonstrate no additional findings. IMPRESSION: 1. New irregular  mural thickening throughout the gastric antrum, concerning for gastric carcinoma versus gastric lymphoma. Endoscopy is recommended for further evaluation. The mural thickening likely results in an element of gastric outlet obstruction, with distension of the proximal stomach. 2. Numerous subcentimeter lymph nodes surrounding the thickened gastric antrum, as well as numerous mesenteric nodules throughout the right upper quadrant, consistent with lymphadenopathy and carcinomatosis. 3. Small bilateral pleural effusions, with new pleural calcifications bilaterally likely sequela of interval pleurodesis or trauma. 4. 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 5.  Aortic Atherosclerosis (ICD10-I70.0). Electronically Signed   By: Sharlet Salina M.D.   On: 07/01/2023 20:12    Vassie Loll, MD  [facility]  If 7PM-7AM, please contact night-coverage [email or web address] Password [email or web address] 07/14/2023, 5:04 PM   LOS: 13 days

## 2023-07-14 NOTE — Progress Notes (Signed)
[facility] Progress Note  7 Days Post-Op  Subjective: Patient seen and examined.  He resting comfortably in a chair.  He is tolerating his full liquid diet without nausea and vomiting.  He confirms passing flatus and has had multiple bowel movements in the last 24 hours with his most recent one being this morning.  Objective: Vital signs in last 24 hours: Temp:  [97.5 F (36.4 C)-98.2 F (36.8 C)] 97.7 F (36.5 C) (11/20 0330) Pulse Rate:  [42-95] 80 (11/20 0330) Resp:  [16-18] 18 (11/20 0330) BP: (98-141)/(60-72) 139/70 (11/20 0330) SpO2:  [99 %-100 %] 99 % (11/20 0330) Last BM Date : 07/14/23  Intake/Output from previous day: 11/19 0701 - 11/20 0700 In: 720 [P.O.:720] Out: 100 [Urine:100] Intake/Output this shift: No intake/output data recorded.  General appearance: alert, cooperative, and no distress GI: Abdomen soft, nondistended, no percussion tenderness, mild incisional TTP; no rigidity, guarding, or rebound tenderness; midline incision C/D/I with skin staples in place  Lab Results:  Recent Labs    07/13/23 0442  WBC 7.2  HGB 10.7*  HCT 34.8*  PLT 120*   BMET Recent Labs    07/13/23 0442 07/14/23 0407  NA 140 139  K 3.8 3.5  CL 104 104  CO2 25 25  GLUCOSE 95 97  BUN 11 14  CREATININE 1.02 1.23  CALCIUM 8.3* 8.4*   PT/INR No results for input(s): "LABPROT", "INR" in the last 72 hours.  Studies/Results: DG UGI W SINGLE CM (SOL OR THIN BA)  Result Date: 07/12/2023 CLINICAL DATA:  History of gastric outlet obstruction, post surgical creation of a gastrojejunostomy. EXAM: DG UGI W SINGLE CM TECHNIQUE: Single contrast examination was then performed using water-soluble contrast. FLUOROSCOPY: 2 minutes (33.5 mGy) COMPARISON:  CT abdomen and pelvis-07/01/2023 FINDINGS: Patient was positioned supine on the fluoroscopy table Preprocedural spot fluoroscopic image was obtained of the upper abdomen demonstrate enteric tube tip and side port  project over the expected location of the gastric fundus. Midline skin staples. Water-soluble contrast was then administered via the nasogastric tube with opacification of the gastric lumen. There is a moderate-to-large amount of reflux noted to the level of the mid/distal esophagus. Ultimately, there is passage of contrast from the stomach lumen through the anastomosis with the opacification of the small bowel. Passage of contrast was improved by positioning on the fluoroscopy table at a 45 degree angle. No discrete areas of contrast extravasation are identified. There is minimal passage of contrast through the native gastric antrum to the level of the descending portion of the duodenum. IMPRESSION: 1. Patent gastrojejunostomy anastomosis without evidence of stricture or contrast extravasation. Passage of contrast through the anastomosis with sluggish though improved with positioning of the patient had a 45 degree angle. 2. Moderate-to-large amount of reflux to the level of the mid esophagus. Electronically Signed   By: Simonne Come M.D.   On: 07/12/2023 12:01    Anti-infectives: Anti-infectives (From admission, onward)    Start     Dose/Rate Route Frequency Ordered Stop   07/07/23 1045  ceFAZolin (ANCEF) IVPB 2g/100 mL premix        2 g 200 mL/hr over 30 Minutes Intravenous On call to O.R. 07/07/23 1032 07/07/23 1209   07/07/23 1035  ceFAZolin (ANCEF) 2-4 GM/100ML-% IVPB       Note to Pharmacy: Lillia Mountain: cabinet override      07/07/23 1035 07/07/23 1219      Pathology from mesenteric deposit: A. 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   Note: Confirmatory immunohistochemical stains were performed which show  positivity for keratin cocktail and weak CDX2.  S100 and CD68 highlight  associated fat necrosis.  CK20 and CK7 are both weakly positive.    Assessment/Plan:  Patient is a 72 year old male who was admitted with gastric outlet obstruction and concern for gastric malignancy.  Patient is status post palliative gastrojejunostomy on 11/13.   -UGI on 11/18 demonstrated patency of gastrojejunostomy without evidence of extravasation -Continue full liquid diet.  Will need to be on full liquid diet for 2-3 additional weeks until follow up with me in clinic -Continue bowel regimen and continue to monitor for bowel movements -Continue Eliquis -PRN pain control and antiemetics -Appreciate hospitalist and oncology recommendations -Patient will likely be stable for discharge from a general surgery standpoint in the next 24-48 hours   LOS: 13 days    Dolan Xia A Ulonda Klosowski 07/14/2023

## 2023-07-14 NOTE — Progress Notes (Addendum)
Palliative: Chart review completed.  Went to see [name] but he was sleeping very soundly and did not answer when I called his name.  Elected to let him rest.  Face-to-face conference with transition of care team.  No needs at this time.  Conference with attending, bedside nursing staff, transition of care team related to patient condition, needs, goals of care, disposition.  Plan:   At this point continue full scope/full code.  Short-term rehab, anticipate less than 2 weeks needed at [facility], follow-up with trusted oncologist, Dr. Ellin Saba, outpatient.     No charge  Lillia Carmel, NP Palliative medicine team Team phone [phone]

## 2023-07-14 NOTE — Consult Note (Signed)
[facility] Liaison Note  07/14/2023  [name] 01-23-1951 [id number]  Location: RN Hospital Liaison screened the patient remotely at [facility].  Insurance: [employer]   Frank H Zeplin Gianelli. is a 72 y.o. male who is a Primary Care Patient of [name], MD-[facility]. The patient was screened for readmission hospitalization with noted high risk score for unplanned readmission risk with 1 IP in 6 months.  The patient was assessed for potential Care Management service needs for post hospital transition for care coordination. Review of patient's electronic medical record reveals patient was admitted with Gastric outlet obstruction. Pt will discharged to SNF level of care.This facility will continue to address pt's ongoing needs post hospital discharge.   [facility] Care Management/Population Health does not replace or interfere with any arrangements made by the Inpatient Transition of Care team.   For questions contact:   Frank Cousin, RN, [facility] Liaison [place]   [facility], Population Health Office Hours MTWF  8:00 am-6:00 pm Direct Dial: [phone] mobile [phone] [Office toll free line] Office Hours are M-F 8:30 - 5 pm [name]

## 2023-07-14 NOTE — TOC Progression Note (Signed)
Transition of Care (TOC) - Progression Note    Patient Details  Name: [name]. MRN: [id number] Date of Birth: 10/20/1950  Transition of Care [facility]) CM/SW Contact  Villa Herb, [place] Phone Number: 07/14/2023, 12:45 PM  Clinical Narrative:    CSW spoke with pts wife to review bed offers. [facility] only option in [place], states she has had family there in the past. Requested that CSW speak with pt. CSW spoke to pt at bedside he is agreeable to SNF at [facility]. Insurance auth to be started. TOC to follow.   Expected Discharge Plan: Home/Self Care Barriers to Discharge: Continued Medical Work up  Expected Discharge Plan and Services                                               Social Determinants of Health (SDOH) Interventions SDOH Screenings   Food Insecurity: No Food Insecurity (07/01/2023)  Housing: Low Risk  (07/01/2023)  Transportation Needs: No Transportation Needs (07/01/2023)  Utilities: Not At Risk (07/01/2023)  Alcohol Screen: Low Risk  (06/28/2020)  Depression (PHQ2-9): Low Risk  (05/01/2020)  Financial Resource Strain: Low Risk  (06/28/2020)  Physical Activity: Inactive (06/28/2020)  Social Connections: Moderately Integrated (06/28/2020)  Stress: No Stress Concern Present (06/28/2020)  Tobacco Use: Medium Risk (07/05/2023)    Readmission Risk Interventions     No data to display

## 2023-07-14 NOTE — Plan of Care (Signed)

## 2023-07-15 DIAGNOSIS — I5021 Acute systolic (congestive) heart failure: Secondary | ICD-10-CM | POA: Diagnosis not present

## 2023-07-15 DIAGNOSIS — I82612 Acute embolism and thrombosis of superficial veins of left upper extremity: Secondary | ICD-10-CM | POA: Diagnosis not present

## 2023-07-15 DIAGNOSIS — I509 Heart failure, unspecified: Secondary | ICD-10-CM | POA: Diagnosis not present

## 2023-07-15 DIAGNOSIS — J690 Pneumonitis due to inhalation of food and vomit: Secondary | ICD-10-CM | POA: Diagnosis not present

## 2023-07-15 DIAGNOSIS — Z7401 Bed confinement status: Secondary | ICD-10-CM | POA: Diagnosis not present

## 2023-07-15 DIAGNOSIS — R0682 Tachypnea, not elsewhere classified: Secondary | ICD-10-CM | POA: Diagnosis not present

## 2023-07-15 DIAGNOSIS — I4901 Ventricular fibrillation: Secondary | ICD-10-CM | POA: Diagnosis not present

## 2023-07-15 DIAGNOSIS — N179 Acute kidney failure, unspecified: Secondary | ICD-10-CM | POA: Diagnosis not present

## 2023-07-15 DIAGNOSIS — E119 Type 2 diabetes mellitus without complications: Secondary | ICD-10-CM | POA: Diagnosis not present

## 2023-07-15 DIAGNOSIS — D6832 Hemorrhagic disorder due to extrinsic circulating anticoagulants: Secondary | ICD-10-CM | POA: Diagnosis not present

## 2023-07-15 DIAGNOSIS — C169 Malignant neoplasm of stomach, unspecified: Secondary | ICD-10-CM | POA: Diagnosis not present

## 2023-07-15 DIAGNOSIS — R638 Other symptoms and signs concerning food and fluid intake: Secondary | ICD-10-CM | POA: Diagnosis not present

## 2023-07-15 DIAGNOSIS — Z66 Do not resuscitate: Secondary | ICD-10-CM | POA: Diagnosis not present

## 2023-07-15 DIAGNOSIS — R0989 Other specified symptoms and signs involving the circulatory and respiratory systems: Secondary | ICD-10-CM | POA: Diagnosis not present

## 2023-07-15 DIAGNOSIS — Z980 Intestinal bypass and anastomosis status: Secondary | ICD-10-CM | POA: Diagnosis not present

## 2023-07-15 DIAGNOSIS — M6281 Muscle weakness (generalized): Secondary | ICD-10-CM | POA: Diagnosis not present

## 2023-07-15 DIAGNOSIS — I97711 Intraoperative cardiac arrest during other surgery: Secondary | ICD-10-CM | POA: Diagnosis not present

## 2023-07-15 DIAGNOSIS — D649 Anemia, unspecified: Secondary | ICD-10-CM | POA: Diagnosis not present

## 2023-07-15 DIAGNOSIS — G9341 Metabolic encephalopathy: Secondary | ICD-10-CM | POA: Diagnosis not present

## 2023-07-15 DIAGNOSIS — Z515 Encounter for palliative care: Secondary | ICD-10-CM | POA: Diagnosis not present

## 2023-07-15 DIAGNOSIS — E782 Mixed hyperlipidemia: Secondary | ICD-10-CM | POA: Diagnosis not present

## 2023-07-15 DIAGNOSIS — I5032 Chronic diastolic (congestive) heart failure: Secondary | ICD-10-CM | POA: Diagnosis not present

## 2023-07-15 DIAGNOSIS — E440 Moderate protein-calorie malnutrition: Secondary | ICD-10-CM | POA: Diagnosis not present

## 2023-07-15 DIAGNOSIS — C800 Disseminated malignant neoplasm, unspecified: Secondary | ICD-10-CM | POA: Diagnosis not present

## 2023-07-15 DIAGNOSIS — A419 Sepsis, unspecified organism: Secondary | ICD-10-CM | POA: Diagnosis not present

## 2023-07-15 DIAGNOSIS — I462 Cardiac arrest due to underlying cardiac condition: Secondary | ICD-10-CM | POA: Diagnosis not present

## 2023-07-15 DIAGNOSIS — I10 Essential (primary) hypertension: Secondary | ICD-10-CM | POA: Diagnosis not present

## 2023-07-15 DIAGNOSIS — D62 Acute posthemorrhagic anemia: Secondary | ICD-10-CM | POA: Diagnosis not present

## 2023-07-15 DIAGNOSIS — E118 Type 2 diabetes mellitus with unspecified complications: Secondary | ICD-10-CM | POA: Diagnosis not present

## 2023-07-15 DIAGNOSIS — J9601 Acute respiratory failure with hypoxia: Secondary | ICD-10-CM | POA: Diagnosis not present

## 2023-07-15 DIAGNOSIS — K921 Melena: Secondary | ICD-10-CM | POA: Diagnosis not present

## 2023-07-15 DIAGNOSIS — Z711 Person with feared health complaint in whom no diagnosis is made: Secondary | ICD-10-CM | POA: Diagnosis not present

## 2023-07-15 DIAGNOSIS — R5381 Other malaise: Secondary | ICD-10-CM | POA: Diagnosis not present

## 2023-07-15 DIAGNOSIS — R627 Adult failure to thrive: Secondary | ICD-10-CM | POA: Diagnosis not present

## 2023-07-15 DIAGNOSIS — E1122 Type 2 diabetes mellitus with diabetic chronic kidney disease: Secondary | ICD-10-CM | POA: Diagnosis not present

## 2023-07-15 DIAGNOSIS — Z743 Need for continuous supervision: Secondary | ICD-10-CM | POA: Diagnosis not present

## 2023-07-15 DIAGNOSIS — I5022 Chronic systolic (congestive) heart failure: Secondary | ICD-10-CM | POA: Diagnosis not present

## 2023-07-15 DIAGNOSIS — Z9484 Stem cells transplant status: Secondary | ICD-10-CM | POA: Diagnosis not present

## 2023-07-15 DIAGNOSIS — R279 Unspecified lack of coordination: Secondary | ICD-10-CM | POA: Diagnosis not present

## 2023-07-15 DIAGNOSIS — I130 Hypertensive heart and chronic kidney disease with heart failure and stage 1 through stage 4 chronic kidney disease, or unspecified chronic kidney disease: Secondary | ICD-10-CM | POA: Diagnosis not present

## 2023-07-15 DIAGNOSIS — I4892 Unspecified atrial flutter: Secondary | ICD-10-CM | POA: Diagnosis not present

## 2023-07-15 DIAGNOSIS — I82890 Acute embolism and thrombosis of other specified veins: Secondary | ICD-10-CM | POA: Diagnosis not present

## 2023-07-15 DIAGNOSIS — R578 Other shock: Secondary | ICD-10-CM | POA: Diagnosis not present

## 2023-07-15 DIAGNOSIS — D61818 Other pancytopenia: Secondary | ICD-10-CM | POA: Diagnosis not present

## 2023-07-15 DIAGNOSIS — K254 Chronic or unspecified gastric ulcer with hemorrhage: Secondary | ICD-10-CM | POA: Diagnosis not present

## 2023-07-15 DIAGNOSIS — Z789 Other specified health status: Secondary | ICD-10-CM | POA: Diagnosis not present

## 2023-07-15 DIAGNOSIS — R0902 Hypoxemia: Secondary | ICD-10-CM | POA: Diagnosis not present

## 2023-07-15 DIAGNOSIS — Z7189 Other specified counseling: Secondary | ICD-10-CM | POA: Diagnosis not present

## 2023-07-15 DIAGNOSIS — C8330 Diffuse large B-cell lymphoma, unspecified site: Secondary | ICD-10-CM | POA: Diagnosis not present

## 2023-07-15 DIAGNOSIS — K922 Gastrointestinal hemorrhage, unspecified: Secondary | ICD-10-CM | POA: Diagnosis not present

## 2023-07-15 DIAGNOSIS — I469 Cardiac arrest, cause unspecified: Secondary | ICD-10-CM | POA: Diagnosis not present

## 2023-07-15 DIAGNOSIS — D591 Autoimmune hemolytic anemia, unspecified: Secondary | ICD-10-CM | POA: Diagnosis not present

## 2023-07-15 DIAGNOSIS — R531 Weakness: Secondary | ICD-10-CM | POA: Diagnosis not present

## 2023-07-15 DIAGNOSIS — K311 Adult hypertrophic pyloric stenosis: Secondary | ICD-10-CM | POA: Diagnosis not present

## 2023-07-15 DIAGNOSIS — E46 Unspecified protein-calorie malnutrition: Secondary | ICD-10-CM | POA: Diagnosis not present

## 2023-07-15 DIAGNOSIS — R579 Shock, unspecified: Secondary | ICD-10-CM | POA: Diagnosis not present

## 2023-07-15 DIAGNOSIS — N1831 Chronic kidney disease, stage 3a: Secondary | ICD-10-CM | POA: Diagnosis not present

## 2023-07-15 DIAGNOSIS — C163 Malignant neoplasm of pyloric antrum: Secondary | ICD-10-CM | POA: Diagnosis not present

## 2023-07-15 DIAGNOSIS — I482 Chronic atrial fibrillation, unspecified: Secondary | ICD-10-CM | POA: Diagnosis not present

## 2023-07-15 LAB — GLUCOSE, CAPILLARY
Glucose-Capillary: 98 mg/dL (ref 70–99)
Glucose-Capillary: 96 mg/dL (ref 70–99)
Glucose-Capillary: 97 mg/dL (ref 70–99)
Glucose-Capillary: 98 mg/dL (ref 70–99)
Glucose-Capillary: 73 mg/dL (ref 70–99)
Glucose-Capillary: 103 mg/dL — ABNORMAL HIGH (ref 70–99)

## 2023-07-15 MED ORDER — METOPROLOL SUCCINATE ER 50 MG PO TB24: 50.0000 mg | ORAL_TABLET | Freq: Every day | ORAL | Status: DC

## 2023-07-15 MED ORDER — OXYCODONE HCL 5 MG PO TABS: 5.0000 mg | ORAL_TABLET | Freq: Four times a day (QID) | ORAL | 0 refills | Status: DC | PRN

## 2023-07-15 MED ORDER — PROCHLORPERAZINE MALEATE 10 MG PO TABS: 10.0000 mg | ORAL_TABLET | Freq: Four times a day (QID) | ORAL | Status: DC | PRN

## 2023-07-15 MED ORDER — BISACODYL 10 MG RE SUPP: 10.0000 mg | Freq: Every day | RECTAL | Status: DC | PRN

## 2023-07-15 MED ORDER — SIMETHICONE 80 MG PO CHEW: 80.0000 mg | CHEWABLE_TABLET | Freq: Four times a day (QID) | ORAL | Status: DC | PRN

## 2023-07-15 MED ORDER — ENSURE ENLIVE PO LIQD: 237.0000 mL | Freq: Three times a day (TID) | ORAL | Status: DC

## 2023-07-15 MED ORDER — METOPROLOL SUCCINATE ER 25 MG PO TB24: 25.0000 mg | ORAL_TABLET | Freq: Every day | ORAL | Status: DC

## 2023-07-15 MED ORDER — POLYETHYLENE GLYCOL 3350 17 G PO PACK: 17.0000 g | PACK | Freq: Every day | ORAL | Status: DC

## 2023-07-15 NOTE — Discharge Instructions (Signed)
Surgery Discharge Instructions  Activity  Resume light activity. No heavy lifting over 10 lbs or strenuous exercise.  Fluids and Diet Full liquid diet until your follow up visit  Medications  If you have not had a bowel movement in 24 hours, take 2 tablespoons over the counter Milk of mag.             You May resume your blood thinners.  Operative Site  You have skin staples in place that will be removed at your follow up visit. Ok to English as a second language teacher. Keep wound clean and dry. No baths or swimming. No lifting more than 10 pounds.  Contact Information: If you have questions or concerns, please call our office, [phone], Monday- Thursday 8AM-5PM and Friday 8AM-12Noon.  If it is after hours or on the weekend, please call [facility] Main Number, [phone], and ask to speak to the surgeon on call for Dr. Robyne Peers at [facility].   SPECIFIC COMPLICATIONS TO WATCH FOR: Inability to urinate Fever over 101? F by mouth Nausea and vomiting lasting longer than 24 hours. Pain not relieved by medication ordered Swelling around the operative site Increased redness, warmth, hardness, around operative area Numbness, tingling, or cold fingers or toes Blood -soaked dressing, (small amounts of oozing may be normal) Increasing and progressive drainage from surgical area or exam site

## 2023-07-15 NOTE — Progress Notes (Signed)
Palliative:   Mr. [name] is sitting up in the [place] chair in his room.  He appears chronically ill and somewhat frail.  He greets me, making and mostly keeping eye contact.  He is alert and oriented, able to make his needs known.  There is no family at bedside at this time.  We talked about his acute health concerns.  Overall he is knowledgeable about the treatment plan.  He is agreeable to go to short-term rehab at [facility].  He shares that he still feels quite weak.  We talked about his recent surgery, cancer diagnosis, and limited nutrition for the last few weeks.  I encouraged him to do exercises on his own hourly.  We talked about following up with his trusted oncologist, Dr. Ellin Saba, on an outpatient basis.  Conference with attending, bedside nursing staff, transition of care team related to patient condition, needs, goals of care, disposition.  Plan: Continue to treat the treatable.  Remains full scope/full code at this time.  Short-term rehab at [facility] with ultimate goal of returning home.  Follow-up with trusted oncologist, Dr. Ellin Saba, outpatient.  Would benefit from outpatient palliative services, considering.  35 minutes  Lillia Carmel, NP Palliative medicine team Team phone [phone]

## 2023-07-15 NOTE — TOC Transition Note (Signed)
Transition of Care [facility]) - CM/SW Discharge Note   Patient Details  Name: [name]. MRN: [id number] Date of Birth: 1950/10/22  Transition [facility]) CM/SW Contact:  Villa Herb, LCSWA Phone Number: 07/15/2023, 1:59 PM   Clinical Narrative:    CSW updated that pts insurance Berkley Harvey has been approved fro SNF. CSW spoke to [place] at [facility] who states that they can accept pt today. CSW updated pts wife of plan for D/C today. CSW provided RN with room and report numbers. Med necessity completed and sent to floor for RN. EMS called for transport. TOC signing off.   Final next level of care: Skilled Nursing Facility Barriers to Discharge: Barriers Resolved   Patient Goals and CMS Choice CMS Medicare.gov Compare Post Acute Care list provided to:: Patient Choice offered to / list presented to : Patient, Spouse  Discharge Placement                  Patient to be transferred to facility by: EMS Name of family member notified: wife Patient and family notified of of transfer: 07/15/23  Discharge Plan and Services Additional resources added to the After Visit Summary for                                       Social Determinants of Health (SDOH) Interventions SDOH Screenings   Food Insecurity: No Food Insecurity (07/01/2023)  Housing: Low Risk  (07/01/2023)  Transportation Needs: No Transportation Needs (07/01/2023)  Utilities: Not At Risk (07/01/2023)  Alcohol Screen: Low Risk  (06/28/2020)  Depression (PHQ2-9): Low Risk  (05/01/2020)  Financial Resource Strain: Low Risk  (06/28/2020)  Physical Activity: Inactive (06/28/2020)  Social Connections: Moderately Integrated (06/28/2020)  Stress: No Stress Concern Present (06/28/2020)  Tobacco Use: Medium Risk (07/05/2023)     Readmission Risk Interventions     No data to display

## 2023-07-15 NOTE — Discharge Summary (Signed)
Physician Discharge Summary   Patient: [name]. MRN: [id number] DOB: 09/01/1950  Admit date:     07/01/2023  Discharge date: 07/15/23  Discharge Physician: Vassie Loll   PCP: [name], MD   Recommendations at discharge:  Repeat basic metabolic panel to follow electrolytes renal function Repeat CBC to follow hemoglobin trend/stability Patient to follow-up with general surgery and oncology service as instructed Reassess blood pressure and adjust antihypertensive regimen as needed.  Discharge Diagnoses: Principal Problem:   Gastric outlet obstruction Active Problems:   Mixed hyperlipidemia   DIASTOLIC HEART FAILURE, CHRONIC   Essential hypertension, benign   Aortic atherosclerosis (HCC)   Gastric mass   Malnutrition of moderate degree   Gastric adenocarcinoma (HCC)   Acute renal failure superimposed on stage 3a chronic kidney disease [facility])  Hospital Course:  [name]  is a 72 y.o. male,  with medical history significant for hypertension, atrial flutter on Eliquis, diastolic heart failure, diabetes mellitus type 2, diffuse large B-cell lymphoma status post stem cell transplant 2007, iron deficiency anemia, recent diagnosis of prostate cancer, status postradiation . -Patient presents to ED secondary to complaints of early satiety, bloating, poor appetite and oral intake, belching, nausea, but no vomiting, as well he does report bloating, he denies fever, chills, he reports significant weight loss due to poor oral intake from his GI symptoms. -In ED workup significant for creatinine at 1.34 which is around her baseline, hemoglobin stable at 11.5, white blood cell count at 3.8, patient had CT abdomen pelvis done which was significant for irregular thickening throughout the gastric antrum concerning for gastric carcinoma versus gastric lymphoma, with an element of gastric outlet obstruction with surrounding lymphadenopathy, possible carcinomatosis.  Assessment and  Plan: Gastric output obstruction due to gastric adenocarcinoma Early satiety/weight loss Failure to thrive -07/01/23 CT abdomen pelvis with concern of gastric outlet obstruction, with malignancy and lymphadenopathy -11/8 endoscopy by GI with Dr. 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  -Dr Ellin Saba met with patient and family to discuss treatment plan on 11/11.  Pt wants to pursue chemotherapy.  -07/07/23--gastrojejunostomy--Dr. 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 -Per general surgery continue full liquid diet until outpatient follow-up. -Discharge to a skilled nursing facility for further care and rehabilitation.   Acute on chronic renal failure--CKD 3a  -baseline creatinine 1.1-1.4 -suspect a component of contrast nephropathy -Renal function has improved, stabilized and back to baseline. -Continue to follow creatinine/renal function trend intermittently.   Hypoglycemia -Stabilized and corrected -Patient cortisol 17.6 -Continue to follow CBGs intermittently. -Patient tolerating full liquid diet and so far no further episode of hypoglycemia.   Superficial vein thrombus--cephalic vein -Continue local care -Patient will continue treatment with Eliquis.   History of autoimmune hemolytic anemia -Hgb  is stable -Follow trend.  No overt bleeding appreciated.   Bilateral pleural  effusion -Patient is currently on room air. -Good saturation appreciated.  Denies shortness of breath.   History of prostate cancer -Patient received radiation therapy earlier this year, per him and his wife report his PSA has been within normal  limit. -Continue patient follow-up with urology service.   History of Diffuse large B-cell lymphoma: -Diagnosed in November 2006, treated with R-CHOP chemotherapy with incomplete response.   -Continue outpatient follow-up with oncology service (Dr. Ellin Saba).      Atrial flutter on Eliquis, rate controlled -Continue Lopressor -Continue Eliquis.   DM2, not on insulin -no longer on therapy   02/12/20 A1C 8.2 -Most recent A1C 4.6 in October 2024 -Continue to follow CBG fluctuation intermittently, not requiring therapy at the moment.   HLD -Continue statin.   Essential HTN -Continue the use of metoprolol -Follow heart healthy diet   Hypophosphatemia -Repleted -Follow electrolytes trend and further replete as needed.  Moderate malnutrition in the setting of acute illness -Continue feeding supplements -Maintain adequate hydration.  Consultants: General Surgery, oncology service Procedures performed: See below for x-ray reports; status post gastrojejunostomy 07/07/2023. Disposition: Skilled nursing facility Diet recommendation: Full liquid diet.  DISCHARGE MEDICATION: Allergies as of 07/15/2023       Reactions   Penicillins Rash        Medication List     STOP taking these medications    cyanocobalamin 1000 MCG tablet Commonly known as: VITAMIN B12   FeroSul 325 (65 Fe) MG tablet Generic drug: ferrous sulfate   folic acid 1 MG tablet Commonly known as: FOLVITE       TAKE these medications    apixaban 5 MG Tabs tablet Commonly known as: Eliquis Take 1 tablet (5 mg total) by mouth 2 (two) times daily.   bisacodyl 10 MG suppository Commonly known as: DULCOLAX Place 1 suppository (10 mg total) rectally daily  as needed for moderate constipation.   feeding supplement Liqd Take 237 mLs by mouth 3 (three) times daily between meals.   furosemide 20 MG tablet Commonly known as: LASIX Take 1 tablet (20 mg total) by mouth daily as needed for fluid or edema.   metoprolol succinate 25 MG 24 hr tablet Commonly known as: Toprol XL Take 1 tablet (25 mg total) by mouth daily. Take with or immediately following a meal.   ONE TOUCH ULTRA 2 w/Device Kit USE TO TEST BLOOD SUGAR ONCE D UTD   OneTouch Delica Plus Lancet33G Misc USE TO TEST TWICE DAILY   OneTouch Ultra test strip Generic drug: glucose blood TEST TWICE DAILY   oxyCODONE 5 MG immediate release tablet Commonly known as: Oxy IR/ROXICODONE Take 1 tablet (5 mg total) by mouth every 6 (six) hours as needed for severe pain (pain score 7-10).   polyethylene glycol 17 g packet Commonly known as: MIRALAX / GLYCOLAX Take 17 g by mouth daily. Start taking on: July 16, 2023   potassium chloride 10 MEQ tablet Commonly known as: KLOR-CON Take 10 meq on days you need to take Lasix What changed: how much to take   prochlorperazine 10 MG tablet Commonly known as: COMPAZINE Take 1 tablet (10 mg total) by mouth every 6 (six) hours as needed for nausea or vomiting.   rosuvastatin 40 MG tablet Commonly known as: CRESTOR Take 40 mg by mouth daily.   simethicone 80 MG chewable tablet Commonly known as: MYLICON Chew 1 tablet (80 mg total) by mouth every 6 (six) hours as needed for flatulence (obstipation).   Vitamin D (Ergocalciferol) 1.25 MG (50000 UNIT) Caps capsule Commonly known as: DRISDOL TAKE 1 CAPSULE BY MOUTH ONCE WEEKLY        Contact information for follow-up providers     Pappayliou, [name] A, DO. Go on 07/27/2023.   Specialty:  General Surgery Why: Go to your scheduled appointment on 12/3 at 2:45 PM Contact information: [address] [place] [facility] [address] [phone]         [name], MD. Schedule an  appointment as soon as possible for a visit in 10 day(s).   Specialty: Internal Medicine Why: After discharge from the skilled nursing facility. Contact information: [address] [place] [address] [phone]              Contact information for after-discharge care     Destination     [facility] .   Service: Skilled Nursing Contact information: [address] [place] [address] [phone]                    Discharge Exam: Ceasar Mons Weights   07/01/23 1427 07/01/23 2216  Weight: 93.9 kg 90.5 kg   General exam: Alert, awake, oriented x 3; afebrile, no chest pain, no nausea, no vomiting.  Reports tolerating full liquid diet and having bowel movements. Respiratory system: Good saturation on room air; no using accessory muscle. Cardiovascular system: No rubs, no gallops, no JVD. Gastrointestinal system: Abdomen is nondistended, soft and nontender.  Positive bowel sounds appreciated. Central nervous system: Alert and oriented. No focal neurological deficits. Extremities: No cyanosis or clubbing; trace to 1+ edema appreciated bilaterally (appears to be chronic and at baseline). Skin: No petechiae.  Midline incision in his abdomen appears to be clean dry and intact.  Skin staples in place no suppuration. Psychiatry: Judgement and insight appear normal. Mood & affect appropriate.   Condition at discharge: Stable and improved.  The results of significant diagnostics from this hospitalization (including imaging, microbiology, ancillary and laboratory) are listed below for reference.   Imaging Studies: DG UGI W SINGLE CM (SOL OR THIN BA)  Result Date: 07/12/2023 CLINICAL DATA:  History of gastric outlet obstruction, post surgical creation of a gastrojejunostomy. EXAM: DG UGI W SINGLE CM TECHNIQUE: Single contrast examination was then performed using water-soluble contrast. FLUOROSCOPY: 2 minutes  (33.5 mGy) COMPARISON:  CT abdomen and pelvis-07/01/2023 FINDINGS: Patient was positioned supine on the fluoroscopy table Preprocedural spot fluoroscopic image was obtained of the upper abdomen demonstrate enteric tube tip and side port project over the expected location of the gastric fundus. Midline skin staples. Water-soluble contrast was then administered via the nasogastric tube with opacification of the gastric lumen. There is a moderate-to-large amount of reflux noted to the level of the mid/distal esophagus. Ultimately, there is passage of contrast from the stomach lumen through the anastomosis with the opacification of the small bowel. Passage of contrast was improved by positioning on the fluoroscopy table at a 45 degree angle. No discrete areas of contrast extravasation are identified. There is minimal passage of contrast through the native gastric antrum to the level of the descending portion of the duodenum. IMPRESSION: 1. Patent gastrojejunostomy anastomosis without evidence of stricture or contrast extravasation. Passage of contrast through the anastomosis with sluggish though improved with positioning of the patient had a 45 degree angle. 2. Moderate-to-large amount of reflux to the level of the mid esophagus. Electronically Signed   By: Simonne Come M.D.   On: 07/12/2023 12:01   US Venous Img Upper Uni Left (DVT)  Result Date: 07/10/2023 CLINICAL DATA:  Left upper extremity pain and edema. Evaluate for DVT. EXAM: LEFT UPPER EXTREMITY VENOUS DOPPLER ULTRASOUND TECHNIQUE: Gray-scale sonography with graded compression, as well as color Doppler and duplex ultrasound were performed  to evaluate the upper extremity deep venous system from the level of the subclavian vein and including the jugular, axillary, basilic, radial, ulnar and upper cephalic vein. Spectral Doppler was utilized to evaluate flow at rest and with distal augmentation maneuvers. COMPARISON:  None Available. FINDINGS: Contralateral  Subclavian Vein: Respiratory phasicity is normal and symmetric with the symptomatic side. No evidence of thrombus. Normal compressibility. Internal Jugular Vein: No evidence of thrombus. Normal compressibility, respiratory phasicity and response to augmentation. Subclavian Vein: No evidence of thrombus. Normal compressibility, respiratory phasicity and response to augmentation. Axillary Vein: No evidence of thrombus. Normal compressibility, respiratory phasicity and response to augmentation. Cephalic Vein: While the cephalic vein appears patent at the level of the left upper arm (image 23), there is hypoechoic occlusive thrombus within the cephalic vein at the level of the antecubital fossa (image 26). The cephalic vein appears patent at the level of the wrist (image 27). Basilic Vein: No evidence of thrombus. Normal compressibility, respiratory phasicity and response to augmentation. Brachial Veins: No evidence of thrombus. Normal compressibility, respiratory phasicity and response to augmentation. Radial Veins: No evidence of thrombus. Normal compressibility, respiratory phasicity and response to augmentation. Ulnar Veins: No evidence of thrombus. Normal compressibility, respiratory phasicity and response to augmentation. Other Findings: There is a moderate amount of subcutaneous edema at the level of the left forearm (images 32 through 34). IMPRESSION: 1. Occlusive thrombus within the cephalic vein at the level of the antecubital fossa. 2. No evidence of DVT within the left upper extremity. Electronically Signed   By: Simonne Come M.D.   On: 07/10/2023 16:22   CT ABDOMEN PELVIS W CONTRAST  Result Date: 07/01/2023 CLINICAL DATA:  Abnormal bowel movements for 1 month, nausea, unintentional weight loss, prior history of lymphoma EXAM: CT ABDOMEN AND PELVIS WITH CONTRAST TECHNIQUE: Multidetector CT imaging of the abdomen and pelvis was performed using the standard protocol following bolus administration of  intravenous contrast. RADIATION DOSE REDUCTION: This exam was performed according to the departmental dose-optimization program which includes automated exposure control, adjustment of the mA and/or kV according to patient size and/or use of iterative reconstruction technique. CONTRAST:  OMNIPAQUE IOHEXOL 300 MG/ML  SOLN COMPARISON:  08/04/2019 FINDINGS: Lower chest: There are small bilateral pleural effusions. New bilateral pleural calcifications could reflect sequela of prior pleurodesis or trauma. No acute airspace disease. Chronic scarring at the right lung base. Hepatobiliary: No focal liver abnormality is seen. No gallstones, gallbladder wall thickening, or biliary dilatation. Pancreas: Unremarkable. No pancreatic ductal dilatation or surrounding inflammatory changes. Spleen: Normal in size without focal abnormality. Adrenals/Urinary Tract: Adrenal glands are unremarkable. Kidneys are normal, without renal calculi, focal lesion, or hydronephrosis. Bladder is unremarkable. Stomach/Bowel: There is irregular wall thickening of the gastric antrum and pylorus, measuring up to 19 mm in maximal thickness. This likely results in an element of gastric outlet obstruction, with distension of the proximal stomach. Differential would include gastric lymphoma versus adenocarcinoma. Endoscopy is recommended for further evaluation. No other signs of bowel obstruction or ileus. Mild fecal retention throughout the colon. Vascular/Lymphatic: Numerous subcentimeter lymph nodes surround the gastric antral wall thickening, measuring up to 9 mm in short axis reference image 37/2. Additionally, there are numerous mesenteric nodules throughout the right upper quadrant which could reflect additional mesenteric adenopathy versus peritoneal carcinomatosis. Largest area measures 2.4 x 2.9 cm reference image 39/2. There is diffuse atherosclerosis of the aorta and its branches. Incidental 1 cm aneurysm extending inferiorly from the  main right renal artery, reference image  58/4, not significantly changed. Reproductive: Prostate is unremarkable. Fiduciary markers are seen within the prostate. Other: Trace free fluid within the lower pelvis. No free intraperitoneal gas. No abdominal wall hernia. Musculoskeletal: There are no acute or destructive bony abnormalities. Postsurgical changes are again noted within the midthoracic spine. Reconstructed images demonstrate no additional findings. IMPRESSION: 1. New irregular mural thickening throughout the gastric antrum, concerning for gastric carcinoma versus gastric lymphoma. Endoscopy is recommended for further evaluation. The mural thickening likely results in an element of gastric outlet obstruction, with distension of the proximal stomach. 2. Numerous subcentimeter lymph nodes surrounding the thickened gastric antrum, as well as numerous mesenteric nodules throughout the right upper quadrant, consistent with lymphadenopathy and carcinomatosis. 3. Small bilateral pleural effusions, with new pleural calcifications bilaterally likely sequela of interval pleurodesis or trauma. 4. 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 5.  Aortic Atherosclerosis (ICD10-I70.0). Electronically Signed   By: Sharlet Salina M.D.   On: 07/01/2023 20:12    Microbiology: Results for orders placed or performed during the hospital encounter of 07/01/23  Surgical pcr screen     Status: None   Collection Time: 07/06/23  8:17 PM   Specimen: Nasal Mucosa; Nasal Swab  Result Value Ref Range Status   MRSA, PCR NEGATIVE NEGATIVE Final   Staphylococcus aureus NEGATIVE NEGATIVE Final    Comment: (NOTE) The Xpert SA Assay (FDA approved for NASAL specimens in patients 31 years of age and older), is one component of a comprehensive surveillance program. It is not intended to diagnose infection nor to guide or monitor treatment. Performed at [facility], [address] [place], [place]  [address]     Labs: CBC: Recent Labs  Lab 07/09/23 0406 07/13/23 0442  WBC 6.5 7.2  HGB 9.6* 10.7*  HCT 31.3* 34.8*  MCV 99.1 97.2  PLT 145* 120*   Basic Metabolic Panel: Recent Labs  Lab 07/09/23 0406 07/10/23 0416 07/11/23 0431 07/13/23 0442 07/14/23 0407  NA 141 143 141 140 139  K 4.0 3.9 3.7 3.8 3.5  CL 107 109 106 104 104  CO2 24 24 25 25 25   GLUCOSE 82 77 117* 95 97  BUN 15 15 11 11 14   CREATININE 1.80* 1.32* 1.09 1.02 1.23  CALCIUM 8.3* 8.1* 8.0* 8.3* 8.4*  MG  --  1.8 1.9 1.6* 2.1  PHOS  --   --  1.7* 2.1*  --     CBG: Recent Labs  Lab 07/14/23 2000 07/14/23 2303 07/15/23 0432 07/15/23 0723 07/15/23 1109  GLUCAP 96 97 98 73 103*    Discharge time spent: greater than 30 minutes.  Signed: Vassie Loll, MD [facility] 07/15/2023

## 2023-07-15 NOTE — Progress Notes (Signed)
Physical Therapy Treatment Patient Details Name: [name]. MRN: [id number] DOB: 05/31/1951 Today's Date: 07/15/2023   History of Present Illness [name]  is a 72 y.o. male,  with medical history significant for hypertension, atrial flutter on Eliquis, diastolic heart failure, diabetes mellitus type 2, diffuse large B-cell lymphoma status post stem cell transplant 2007, iron deficiency anemia, recent diagnosis of prostate cancer, status postradiation .  -Patient presents to ED secondary to complaints of early satiety, bloating, poor appetite and oral intake, belching, nausea, but no vomiting, as well he does report bloating, he denies fever, chills, he reports significant weight loss due to poor oral intake from his GI symptoms.  -In ED workup significant for creatinine at 1.34 which is around her baseline, hemoglobin stable at 11.5, white blood cell count at 3.8, patient had CT abdomen pelvis done which was significant for irregular thickening throughout the gastric antrum concerning for gastric carcinoma versus gastric lymphoma, with an element of gastric outlet obstruction with surrounding lymphadenopathy, possible carcinomatosis.    PT Comments  Pt sitting on EOB with RN in room following bowel movement in bed.  Transfer training with cueing for handplacement to assist with standing, used RW for stability to assist with balance.  Pt able to stand while RN cleans with reports of fatigue.  Able to ambulate front of bed to chair with no LOB, slow labored movements.  Pt left in chair with call bell within reach and chair alarm set.  No reports of pain.  Completed LE strengthening exercises in chair.    If plan is discharge home, recommend the following:     Can travel by private vehicle        Equipment Recommendations       Recommendations for Other Services       Precautions / Restrictions Precautions Precautions: Fall Restrictions Weight Bearing Restrictions: No      Mobility  Bed Mobility               General bed mobility comments: Pt sitting on EOB upon therapist entrance.  Reports of bowel movement in bed.  RN in room to assist with cleaning during transfer to standing.    Transfers Overall transfer level: Needs assistance Equipment used: Rolling walker (2 wheels) Transfers: Sit to/from Stand Sit to Stand: Mod assist           General transfer comment: Labored movements, increased time, cueing for hand placement to assist with standing, increased fatigue with standing for RN to clean following bowel movement    Ambulation/Gait Ambulation/Gait assistance: Mod assist Gait Distance (Feet): 3 Feet Assistive device: Rolling walker (2 wheels) Gait Pattern/deviations: Decreased step length - right, Decreased step length - left, Decreased stride length, Trunk flexed Gait velocity: slow     General Gait Details: limited to a few slow labored side steps at bedside due to c/o fatigue, weakness   Stairs             Wheelchair Mobility     Tilt Bed    Modified Rankin (Stroke Patients Only)       Balance                                            Cognition Arousal: Alert Behavior During Therapy: WFL for tasks assessed/performed Overall Cognitive Status: Within Functional Limits for tasks assessed  Exercises General Exercises - Lower Extremity Long Arc Quad: Seated, AROM, Strengthening, Both, 10 reps Hip ABduction/ADduction: AROM, Strengthening, Both, 10 reps, Seated Hip Flexion/Marching: Seated, AROM, Strengthening, Both, 10 reps Toe Raises: Seated, AROM, Strengthening, Both, 10 reps Heel Raises: Seated, AROM, Strengthening, Both, 10 reps    General Comments        Pertinent Vitals/Pain Pain Assessment Pain Assessment: No/denies pain    Home Living                          Prior Function            PT Goals  (current goals can now be found in the care plan section)      Frequency           PT Plan      Co-evaluation              AM-PAC PT "6 Clicks" Mobility   Outcome Measure  Help needed turning from your back to your side while in a flat bed without using bedrails?: A Little Help needed moving from lying on your back to sitting on the side of a flat bed without using bedrails?: A Lot Help needed moving to and from a bed to a chair (including a wheelchair)?: A Lot Help needed standing up from a chair using your arms (e.g., wheelchair or bedside chair)?: A Lot Help needed to walk in hospital room?: A Lot Help needed climbing 3-5 steps with a railing? : A Lot 6 Click Score: 13    End of Session Equipment Utilized During Treatment: Gait belt Activity Tolerance: Patient tolerated treatment well;Patient limited by fatigue Patient left: in chair;with call bell/phone within reach;with chair alarm set Nurse Communication: Mobility status       Time: 4696-2952 PT Time Calculation (min) (ACUTE ONLY): 21 min  Charges:    $Therapeutic Activity: 8-22 mins PT General Charges $$ ACUTE PT VISIT: 1 Visit                     Becky Sax, LPTA/CLT; CBIS [phone] Juel Burrow 07/15/2023, 10:53 AM

## 2023-07-15 NOTE — Progress Notes (Signed)
Patient remains alert and oriented. Vital signs stable as charted. Night time medications given prior to departure. Notified wife that patient was picked up by EMS to go to skilled nursing facility. Patient stated to me his goal is to leave by next Saturday and be back home with his wife.

## 2023-07-15 NOTE — Care Management Important Message (Signed)
Important Message  Patient Details  Name: [name]. MRN: [id number] Date of Birth: May 03, 1951   Important Message Given:  Yes - Medicare IM (reviewed letter by phone, no additonal copy needed)     Corey Harold 07/15/2023, 2:26 PM

## 2023-07-15 NOTE — Progress Notes (Signed)
[facility] Progress Note  8 Days Post-Op  Subjective: Patient seen and examined.  He is resting comfortably in bed.  He is tolerating his full liquid diet without nausea and vomiting.  He confirms passing flatus and is also having bowel movements.  His biggest complaint is that he feels weak.  He denies abdominal pain  Objective: Vital signs in last 24 hours: Temp:  [97.5 F (36.4 C)-98.2 F (36.8 C)] 98.2 F (36.8 C) (11/21 0429) Pulse Rate:  [83] 83 (11/21 0429) Resp:  [18-19] 19 (11/21 0429) BP: (118-127)/(65-74) 123/67 (11/21 0429) SpO2:  [99 %-100 %] 99 % (11/21 0429) Last BM Date : 07/14/23  Intake/Output from previous day: 11/20 0701 - 11/21 0700 In: 862.7 [P.O.:300; IV Piggyback:562.7] Out: 300 [Urine:300] Intake/Output this shift: No intake/output data recorded.  General appearance: alert, cooperative, and no distress GI: Abdomen soft, nondistended, no percussion tenderness, nontender to palpation; no rigidity, guarding, or rebound tenderness; midline incision C/D/I with skin staples in place  Lab Results:  Recent Labs    07/13/23 0442  WBC 7.2  HGB 10.7*  HCT 34.8*  PLT 120*   BMET Recent Labs    07/13/23 0442 07/14/23 0407  NA 140 139  K 3.8 3.5  CL 104 104  CO2 25 25  GLUCOSE 95 97  BUN 11 14  CREATININE 1.02 1.23  CALCIUM 8.3* 8.4*   PT/INR No results for input(s): "LABPROT", "INR" in the last 72 hours.  Studies/Results: No results found.  Anti-infectives: Anti-infectives (From admission, onward)    Start     Dose/Rate Route Frequency Ordered Stop   07/07/23 1045  ceFAZolin (ANCEF) IVPB 2g/100 mL premix        2 g 200 mL/hr over 30 Minutes Intravenous On call to O.R. 07/07/23 1032 07/07/23 1209   07/07/23 1035  ceFAZolin (ANCEF) 2-4 GM/100ML-% IVPB       Note to Pharmacy: Lillia Mountain: cabinet override      07/07/23 1035 07/07/23 1219      Pathology from mesenteric deposit: A. 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   Note: Confirmatory immunohistochemical stains were performed which show  positivity for keratin cocktail and weak CDX2.  S100 and CD68 highlight  associated fat necrosis.  CK20 and CK7 are both weakly positive.   Assessment/Plan:  Patient is a 72 year old male who was admitted with gastric outlet obstruction and concern for gastric malignancy.  Patient is status post palliative gastrojejunostomy on 11/13.   -UGI on 11/18 demonstrated patency of gastrojejunostomy without evidence of extravasation -Continue full liquid diet.  Will need to be on full liquid diet for 2-3 additional weeks until follow up with me in clinic -Follow up schedule for 12/3 at 2:45 PM -Continue bowel regimen and continue to monitor for bowel movements -Continue Eliquis -PRN pain control and antiemetics -Appreciate hospitalist and oncology recommendations -Stable for discharge from a surgical standpoint   LOS: 14 days    Adelayde Minney A Jolicia Delira 07/15/2023

## 2023-07-16 DIAGNOSIS — I509 Heart failure, unspecified: Secondary | ICD-10-CM | POA: Diagnosis not present

## 2023-07-16 DIAGNOSIS — E46 Unspecified protein-calorie malnutrition: Secondary | ICD-10-CM | POA: Diagnosis not present

## 2023-07-16 DIAGNOSIS — I10 Essential (primary) hypertension: Secondary | ICD-10-CM | POA: Diagnosis not present

## 2023-07-16 DIAGNOSIS — E119 Type 2 diabetes mellitus without complications: Secondary | ICD-10-CM | POA: Diagnosis not present

## 2023-07-16 DIAGNOSIS — K311 Adult hypertrophic pyloric stenosis: Secondary | ICD-10-CM | POA: Diagnosis not present

## 2023-07-16 DIAGNOSIS — R5381 Other malaise: Secondary | ICD-10-CM | POA: Diagnosis not present

## 2023-07-19 ENCOUNTER — Other Ambulatory Visit: Payer: Self-pay

## 2023-07-19 ENCOUNTER — Inpatient Hospital Stay (HOSPITAL_COMMUNITY)
Admission: EM | Admit: 2023-07-19 | Discharge: 2023-08-25 | DRG: 374 | Disposition: E | Payer: Medicare Other | Attending: Internal Medicine | Admitting: Internal Medicine

## 2023-07-19 ENCOUNTER — Encounter (HOSPITAL_COMMUNITY): Payer: Self-pay

## 2023-07-19 DIAGNOSIS — Z4682 Encounter for fitting and adjustment of non-vascular catheter: Secondary | ICD-10-CM | POA: Diagnosis not present

## 2023-07-19 DIAGNOSIS — R627 Adult failure to thrive: Secondary | ICD-10-CM | POA: Diagnosis not present

## 2023-07-19 DIAGNOSIS — E860 Dehydration: Secondary | ICD-10-CM | POA: Diagnosis present

## 2023-07-19 DIAGNOSIS — R0682 Tachypnea, not elsewhere classified: Secondary | ICD-10-CM | POA: Diagnosis not present

## 2023-07-19 DIAGNOSIS — I469 Cardiac arrest, cause unspecified: Secondary | ICD-10-CM

## 2023-07-19 DIAGNOSIS — Z9484 Stem cells transplant status: Secondary | ICD-10-CM | POA: Diagnosis not present

## 2023-07-19 DIAGNOSIS — Z87891 Personal history of nicotine dependence: Secondary | ICD-10-CM

## 2023-07-19 DIAGNOSIS — N179 Acute kidney failure, unspecified: Secondary | ICD-10-CM | POA: Diagnosis not present

## 2023-07-19 DIAGNOSIS — C163 Malignant neoplasm of pyloric antrum: Secondary | ICD-10-CM | POA: Diagnosis not present

## 2023-07-19 DIAGNOSIS — I130 Hypertensive heart and chronic kidney disease with heart failure and stage 1 through stage 4 chronic kidney disease, or unspecified chronic kidney disease: Secondary | ICD-10-CM | POA: Diagnosis not present

## 2023-07-19 DIAGNOSIS — D62 Acute posthemorrhagic anemia: Secondary | ICD-10-CM | POA: Diagnosis not present

## 2023-07-19 DIAGNOSIS — Z515 Encounter for palliative care: Secondary | ICD-10-CM | POA: Diagnosis not present

## 2023-07-19 DIAGNOSIS — R578 Other shock: Secondary | ICD-10-CM | POA: Diagnosis not present

## 2023-07-19 DIAGNOSIS — Z7189 Other specified counseling: Secondary | ICD-10-CM | POA: Diagnosis not present

## 2023-07-19 DIAGNOSIS — E870 Hyperosmolality and hypernatremia: Secondary | ICD-10-CM | POA: Diagnosis present

## 2023-07-19 DIAGNOSIS — I462 Cardiac arrest due to underlying cardiac condition: Secondary | ICD-10-CM | POA: Diagnosis not present

## 2023-07-19 DIAGNOSIS — J90 Pleural effusion, not elsewhere classified: Secondary | ICD-10-CM | POA: Diagnosis not present

## 2023-07-19 DIAGNOSIS — R0989 Other specified symptoms and signs involving the circulatory and respiratory systems: Secondary | ICD-10-CM | POA: Diagnosis not present

## 2023-07-19 DIAGNOSIS — Z880 Allergy status to penicillin: Secondary | ICD-10-CM

## 2023-07-19 DIAGNOSIS — Z980 Intestinal bypass and anastomosis status: Secondary | ICD-10-CM

## 2023-07-19 DIAGNOSIS — Z8546 Personal history of malignant neoplasm of prostate: Secondary | ICD-10-CM

## 2023-07-19 DIAGNOSIS — R1310 Dysphagia, unspecified: Secondary | ICD-10-CM | POA: Diagnosis not present

## 2023-07-19 DIAGNOSIS — G9341 Metabolic encephalopathy: Secondary | ICD-10-CM | POA: Diagnosis not present

## 2023-07-19 DIAGNOSIS — A419 Sepsis, unspecified organism: Secondary | ICD-10-CM | POA: Diagnosis not present

## 2023-07-19 DIAGNOSIS — R531 Weakness: Secondary | ICD-10-CM | POA: Diagnosis not present

## 2023-07-19 DIAGNOSIS — E861 Hypovolemia: Secondary | ICD-10-CM | POA: Diagnosis present

## 2023-07-19 DIAGNOSIS — Z789 Other specified health status: Secondary | ICD-10-CM | POA: Diagnosis not present

## 2023-07-19 DIAGNOSIS — I82612 Acute embolism and thrombosis of superficial veins of left upper extremity: Secondary | ICD-10-CM | POA: Diagnosis present

## 2023-07-19 DIAGNOSIS — C800 Disseminated malignant neoplasm, unspecified: Secondary | ICD-10-CM | POA: Diagnosis present

## 2023-07-19 DIAGNOSIS — I10 Essential (primary) hypertension: Secondary | ICD-10-CM | POA: Diagnosis not present

## 2023-07-19 DIAGNOSIS — Z7901 Long term (current) use of anticoagulants: Secondary | ICD-10-CM

## 2023-07-19 DIAGNOSIS — D61818 Other pancytopenia: Secondary | ICD-10-CM | POA: Diagnosis not present

## 2023-07-19 DIAGNOSIS — Z8572 Personal history of non-Hodgkin lymphomas: Secondary | ICD-10-CM

## 2023-07-19 DIAGNOSIS — I480 Paroxysmal atrial fibrillation: Secondary | ICD-10-CM

## 2023-07-19 DIAGNOSIS — I97711 Intraoperative cardiac arrest during other surgery: Secondary | ICD-10-CM | POA: Diagnosis not present

## 2023-07-19 DIAGNOSIS — D6832 Hemorrhagic disorder due to extrinsic circulating anticoagulants: Secondary | ICD-10-CM | POA: Diagnosis not present

## 2023-07-19 DIAGNOSIS — E440 Moderate protein-calorie malnutrition: Secondary | ICD-10-CM | POA: Diagnosis not present

## 2023-07-19 DIAGNOSIS — I482 Chronic atrial fibrillation, unspecified: Secondary | ICD-10-CM | POA: Diagnosis not present

## 2023-07-19 DIAGNOSIS — J9601 Acute respiratory failure with hypoxia: Secondary | ICD-10-CM | POA: Diagnosis not present

## 2023-07-19 DIAGNOSIS — Z79899 Other long term (current) drug therapy: Secondary | ICD-10-CM

## 2023-07-19 DIAGNOSIS — R0902 Hypoxemia: Secondary | ICD-10-CM | POA: Diagnosis not present

## 2023-07-19 DIAGNOSIS — E118 Type 2 diabetes mellitus with unspecified complications: Secondary | ICD-10-CM | POA: Diagnosis not present

## 2023-07-19 DIAGNOSIS — K311 Adult hypertrophic pyloric stenosis: Secondary | ICD-10-CM | POA: Diagnosis present

## 2023-07-19 DIAGNOSIS — Z8042 Family history of malignant neoplasm of prostate: Secondary | ICD-10-CM

## 2023-07-19 DIAGNOSIS — K922 Gastrointestinal hemorrhage, unspecified: Secondary | ICD-10-CM | POA: Diagnosis not present

## 2023-07-19 DIAGNOSIS — Z8249 Family history of ischemic heart disease and other diseases of the circulatory system: Secondary | ICD-10-CM

## 2023-07-19 DIAGNOSIS — C169 Malignant neoplasm of stomach, unspecified: Secondary | ICD-10-CM | POA: Diagnosis present

## 2023-07-19 DIAGNOSIS — I5021 Acute systolic (congestive) heart failure: Secondary | ICD-10-CM | POA: Diagnosis not present

## 2023-07-19 DIAGNOSIS — I5032 Chronic diastolic (congestive) heart failure: Secondary | ICD-10-CM

## 2023-07-19 DIAGNOSIS — D591 Autoimmune hemolytic anemia, unspecified: Secondary | ICD-10-CM | POA: Diagnosis not present

## 2023-07-19 DIAGNOSIS — I4901 Ventricular fibrillation: Secondary | ICD-10-CM | POA: Diagnosis not present

## 2023-07-19 DIAGNOSIS — Z743 Need for continuous supervision: Secondary | ICD-10-CM | POA: Diagnosis not present

## 2023-07-19 DIAGNOSIS — R638 Other symptoms and signs concerning food and fluid intake: Secondary | ICD-10-CM | POA: Diagnosis not present

## 2023-07-19 DIAGNOSIS — D649 Anemia, unspecified: Secondary | ICD-10-CM | POA: Diagnosis not present

## 2023-07-19 DIAGNOSIS — I82890 Acute embolism and thrombosis of other specified veins: Secondary | ICD-10-CM | POA: Diagnosis not present

## 2023-07-19 DIAGNOSIS — Z66 Do not resuscitate: Secondary | ICD-10-CM | POA: Diagnosis present

## 2023-07-19 DIAGNOSIS — K254 Chronic or unspecified gastric ulcer with hemorrhage: Secondary | ICD-10-CM | POA: Diagnosis not present

## 2023-07-19 DIAGNOSIS — E876 Hypokalemia: Secondary | ICD-10-CM | POA: Diagnosis present

## 2023-07-19 DIAGNOSIS — J45909 Unspecified asthma, uncomplicated: Secondary | ICD-10-CM | POA: Diagnosis not present

## 2023-07-19 DIAGNOSIS — I5022 Chronic systolic (congestive) heart failure: Secondary | ICD-10-CM | POA: Diagnosis not present

## 2023-07-19 DIAGNOSIS — I4892 Unspecified atrial flutter: Secondary | ICD-10-CM

## 2023-07-19 DIAGNOSIS — Z452 Encounter for adjustment and management of vascular access device: Secondary | ICD-10-CM | POA: Diagnosis not present

## 2023-07-19 DIAGNOSIS — I493 Ventricular premature depolarization: Secondary | ICD-10-CM | POA: Diagnosis not present

## 2023-07-19 DIAGNOSIS — Z923 Personal history of irradiation: Secondary | ICD-10-CM

## 2023-07-19 DIAGNOSIS — R000 Tachycardia, unspecified: Secondary | ICD-10-CM | POA: Diagnosis present

## 2023-07-19 DIAGNOSIS — R0602 Shortness of breath: Secondary | ICD-10-CM | POA: Diagnosis not present

## 2023-07-19 DIAGNOSIS — Z711 Person with feared health complaint in whom no diagnosis is made: Secondary | ICD-10-CM | POA: Diagnosis not present

## 2023-07-19 DIAGNOSIS — J690 Pneumonitis due to inhalation of food and vomit: Secondary | ICD-10-CM | POA: Diagnosis not present

## 2023-07-19 DIAGNOSIS — N1831 Chronic kidney disease, stage 3a: Secondary | ICD-10-CM | POA: Diagnosis present

## 2023-07-19 DIAGNOSIS — L89322 Pressure ulcer of left buttock, stage 2: Secondary | ICD-10-CM | POA: Diagnosis present

## 2023-07-19 DIAGNOSIS — R54 Age-related physical debility: Secondary | ICD-10-CM | POA: Diagnosis present

## 2023-07-19 DIAGNOSIS — Z860100 Personal history of colon polyps, unspecified: Secondary | ICD-10-CM

## 2023-07-19 DIAGNOSIS — K5900 Constipation, unspecified: Secondary | ICD-10-CM | POA: Diagnosis not present

## 2023-07-19 DIAGNOSIS — R918 Other nonspecific abnormal finding of lung field: Secondary | ICD-10-CM | POA: Diagnosis not present

## 2023-07-19 DIAGNOSIS — E1122 Type 2 diabetes mellitus with diabetic chronic kidney disease: Secondary | ICD-10-CM | POA: Diagnosis not present

## 2023-07-19 DIAGNOSIS — K921 Melena: Secondary | ICD-10-CM | POA: Diagnosis not present

## 2023-07-19 DIAGNOSIS — Z6828 Body mass index (BMI) 28.0-28.9, adult: Secondary | ICD-10-CM

## 2023-07-19 DIAGNOSIS — Z931 Gastrostomy status: Secondary | ICD-10-CM

## 2023-07-19 DIAGNOSIS — J984 Other disorders of lung: Secondary | ICD-10-CM | POA: Diagnosis not present

## 2023-07-19 DIAGNOSIS — Z83438 Family history of other disorder of lipoprotein metabolism and other lipidemia: Secondary | ICD-10-CM

## 2023-07-19 DIAGNOSIS — E782 Mixed hyperlipidemia: Secondary | ICD-10-CM | POA: Diagnosis present

## 2023-07-19 DIAGNOSIS — D5919 Other autoimmune hemolytic anemia: Secondary | ICD-10-CM | POA: Diagnosis present

## 2023-07-19 DIAGNOSIS — Z801 Family history of malignant neoplasm of trachea, bronchus and lung: Secondary | ICD-10-CM

## 2023-07-19 DIAGNOSIS — I2510 Atherosclerotic heart disease of native coronary artery without angina pectoris: Secondary | ICD-10-CM | POA: Diagnosis present

## 2023-07-19 DIAGNOSIS — T45515A Adverse effect of anticoagulants, initial encounter: Secondary | ICD-10-CM | POA: Diagnosis present

## 2023-07-19 DIAGNOSIS — I5042 Chronic combined systolic (congestive) and diastolic (congestive) heart failure: Secondary | ICD-10-CM | POA: Diagnosis present

## 2023-07-19 DIAGNOSIS — R571 Hypovolemic shock: Secondary | ICD-10-CM | POA: Diagnosis present

## 2023-07-19 DIAGNOSIS — I447 Left bundle-branch block, unspecified: Secondary | ICD-10-CM | POA: Diagnosis not present

## 2023-07-19 DIAGNOSIS — R579 Shock, unspecified: Secondary | ICD-10-CM | POA: Diagnosis not present

## 2023-07-19 LAB — BASIC METABOLIC PANEL
Anion gap: 10 (ref 5–15)
BUN: 20 mg/dL (ref 8–23)
CO2: 26 mmol/L (ref 22–32)
Calcium: 8.5 mg/dL — ABNORMAL LOW (ref 8.9–10.3)
Chloride: 106 mmol/L (ref 98–111)
Creatinine, Ser: 1.12 mg/dL (ref 0.61–1.24)
GFR, Estimated: >60 mL/min (ref >60)
Glucose, Bld: 109 mg/dL — ABNORMAL HIGH (ref 70–99)
Potassium: 3.7 mmol/L (ref 3.5–5.1)
Sodium: 142 mmol/L (ref 135–145)

## 2023-07-19 LAB — GLUCOSE, CAPILLARY
Glucose-Capillary: 68 mg/dL — ABNORMAL LOW (ref 70–99)
Glucose-Capillary: 73 mg/dL (ref 70–99)
Glucose-Capillary: 82 mg/dL (ref 70–99)

## 2023-07-19 LAB — CBC WITH DIFFERENTIAL/PLATELET
Abs Immature Granulocytes: 0.04 10*3/uL (ref 0.00–0.07)
Basophils Absolute: 0 10*3/uL (ref 0.0–0.1)
Basophils Relative: 1 %
Eosinophils Absolute: 0.2 10*3/uL (ref 0.0–0.5)
Eosinophils Relative: 3 %
HCT: 19.9 % — ABNORMAL LOW (ref 39.0–52.0)
Hemoglobin: 6.2 g/dL — CL (ref 13.0–17.0)
Immature Granulocytes: 1 %
Lymphocytes Relative: 8 %
Lymphs Abs: 0.5 10*3/uL — ABNORMAL LOW (ref 0.7–4.0)
MCH: 31.6 pg (ref 26.0–34.0)
MCHC: 31.2 g/dL (ref 30.0–36.0)
MCV: 101.5 fL — ABNORMAL HIGH (ref 80.0–100.0)
Monocytes Absolute: 0.5 10*3/uL (ref 0.1–1.0)
Monocytes Relative: 8 %
Neutro Abs: 5.2 10*3/uL (ref 1.7–7.7)
Neutrophils Relative %: 79 %
Platelets: 234 10*3/uL (ref 150–400)
RBC: 1.96 MIL/uL — ABNORMAL LOW (ref 4.22–5.81)
RDW: 16.5 % — ABNORMAL HIGH (ref 11.5–15.5)
WBC: 6.4 10*3/uL (ref 4.0–10.5)
nRBC: 0.6 % — ABNORMAL HIGH (ref 0.0–0.2)

## 2023-07-19 LAB — PREPARE RBC (CROSSMATCH)

## 2023-07-19 LAB — POC OCCULT BLOOD, ED: Fecal Occult Bld: POSITIVE — AB

## 2023-07-19 MED ORDER — ENSURE ENLIVE PO LIQD: 237.0000 mL | Freq: Three times a day (TID) | ORAL | Status: DC

## 2023-07-19 MED ORDER — SODIUM CHLORIDE 0.9% IV SOLUTION: Freq: Once | INTRAVENOUS | Status: DC

## 2023-07-19 MED ORDER — POTASSIUM CHLORIDE CRYS ER 20 MEQ PO TBCR
40.0000 meq | EXTENDED_RELEASE_TABLET | Freq: Once | ORAL | Status: AC
  Administered 2023-07-19: 40 meq via ORAL
  Filled 2023-07-19: qty 2

## 2023-07-19 MED ORDER — PANTOPRAZOLE SODIUM 40 MG IV SOLR: 40.0000 mg | Freq: Once | INTRAVENOUS | Status: DC

## 2023-07-19 MED ORDER — TORSEMIDE 20 MG PO TABS: 20.0000 mg | ORAL_TABLET | Freq: Every day | ORAL | Status: DC

## 2023-07-19 MED ORDER — ONDANSETRON HCL 4 MG/2ML IJ SOLN
4.0000 mg | Freq: Four times a day (QID) | INTRAMUSCULAR | Status: DC | PRN
  Administered 2023-07-21 – 2023-07-24 (×2): 4 mg via INTRAVENOUS
  Filled 2023-07-19 (×2): qty 2

## 2023-07-19 MED ORDER — SODIUM CHLORIDE 0.45 % IV SOLN: INTRAVENOUS | Status: DC

## 2023-07-19 MED ORDER — ACETAMINOPHEN 325 MG PO TABS: 650.0000 mg | ORAL_TABLET | Freq: Four times a day (QID) | ORAL | Status: DC | PRN

## 2023-07-19 MED ORDER — ONDANSETRON HCL 4 MG PO TABS: 4.0000 mg | ORAL_TABLET | Freq: Four times a day (QID) | ORAL | Status: DC | PRN

## 2023-07-19 MED ORDER — ACETAMINOPHEN 650 MG RE SUPP: 650.0000 mg | Freq: Four times a day (QID) | RECTAL | Status: DC | PRN

## 2023-07-19 MED ORDER — EMPTY CONTAINERS FLEXIBLE MISC
900.0000 mg | Freq: Once | Status: AC
  Administered 2023-07-19: 900 mg via INTRAVENOUS
  Filled 2023-07-19: qty 90

## 2023-07-19 MED ORDER — LACTATED RINGERS IV SOLN: INTRAVENOUS | Status: DC

## 2023-07-19 MED ORDER — PANTOPRAZOLE SODIUM 40 MG IV SOLR
80.0000 mg | Freq: Once | INTRAVENOUS | Status: AC
  Administered 2023-07-19: 80 mg via INTRAVENOUS
  Filled 2023-07-19: qty 20

## 2023-07-19 MED ORDER — PANTOPRAZOLE SODIUM 40 MG IV SOLR
40.0000 mg | Freq: Two times a day (BID) | INTRAVENOUS | Status: DC
  Administered 2023-07-20 – 2023-08-01 (×23): 40 mg via INTRAVENOUS
  Filled 2023-07-19 (×24): qty 10

## 2023-07-19 MED ORDER — POLYETHYLENE GLYCOL 3350 17 G PO PACK: 17.0000 g | PACK | Freq: Every day | ORAL | Status: DC | PRN

## 2023-07-19 NOTE — Assessment & Plan Note (Signed)
Currently with swelling to left upper extremity compared to right, recent venous Doppler 11/16 shows occlusive thrombus within the cephalic vein at the level of the antecubital fossa, no evidence of DVT.  -Hold Eliquis for now

## 2023-07-19 NOTE — ED Notes (Signed)
Spoke with Chelsea in the lab regarding the wait for pt blood and was informed that there is a past of antibodies in his blood and so that he is a "list crossmatch" so that is why we are having to get blood from outside. Forwarded information to [employer], 2A nurse receiving pt.

## 2023-07-19 NOTE — Progress Notes (Signed)
07/19/2023 at 2023: CBG 68. Patient receiving first unit of PRBCs. Patient alert, oriented, skin dry. Patient denies feeling shaky or anxious. 240 ml of juice provided.   07/19/2023 at 2059: CBG 82.  Patient given another 240 mls of juice with his medications.

## 2023-07-19 NOTE — H&P (Addendum)
History and Physical    [name]. UEA:[id number] DOB: Jun 08, 1951 DOA: 07/19/2023  PCP: [name], MD   Patient coming from: [facility]  I have personally briefly reviewed patient's old medical records in [facility]  Chief Complaint: Low hemoglobin  HPI: [name]. is a 72 y.o. male with medical history significant for Atria Flutter, autoimmune hemolytic anemia, CKD 3, diastolic CHF, hypertension, gastric mass, diabetes mellitus. Patient was sent to the ED reports of low hemoglobin.  Patient reports he has been having black stools since leaving the hospital.  No vomiting no blood in stools.  No abdominal pain.  He was sent to the ED with reports of hemoglobin of 5.4.  Recent hospitalization 11/7- 11/21 for gastric outlet obstruction due to gastric adenocarcinoma.  EGD 11/8 revealed a large circumferential fungating malignant gastric tumor at the pylorus and in the gastric antrum.  Patient had palliative gastrojejunostomy 11/13.    ED Course: Heart rate 80s.  Blood pressure systolic 114-122.  Hemoglobin 6.2. PRBC ordered for transfusion.  Delay in getting blood due to presence of antibodies. IV Protonix 80 mg given. Kcentra given. GI consulted. EDP talked to Dr. Robyne Peers, general surgeon, does not feel blood los is originating from the anastomosis.  Review of Systems: As per HPI all other systems reviewed and negative.  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   autologous stem cell transplant 12/2005   Obesity, morbid (more than 100 lbs over ideal weight or BMI > 40) (HCC)    Venous insufficiency 04/29/2011    Past Surgical History:  Procedure  Laterality Date   BACK SURGERY  2006   T6 vertebrae removed/titanium placed   BIOPSY  07/02/2023   Procedure: BIOPSY;  Surgeon: Franky Macho, MD;  Location: [facility];  Service: Endoscopy;;   CATARACT EXTRACTION W/PHACO Right 03/31/2019   Procedure: CATARACT EXTRACTION PHACO AND INTRAOCULAR LENS PLACEMENT (IOC);  Surgeon: Fabio Pierce, MD;  Location: [facility] ORS;  Service: Ophthalmology;  Laterality: Right;  CDE: 3.21   CATARACT EXTRACTION W/PHACO Left 04/14/2019   Procedure: CATARACT EXTRACTION PHACO AND INTRAOCULAR LENS PLACEMENT (IOC);  Surgeon: Fabio Pierce, MD;  Location: [facility];  Service: Ophthalmology;  Laterality: Left;  left - pt knows to arrive at 7:45, CDE: 3.55   COLONOSCOPY  11/24/2004   Polyps in the left colon ablated/removed as described above.  Two submucosal lesions consistent with lipomas as described above not  manipulated./ Normal rectum   COLONOSCOPY  06/20/2012   MULTIPLE RECTAL AND COLONIC POLYPS   COLONOSCOPY N/A 03/08/2015   RMR: Capacious, redundant colon. Multiple colonic and rectosigmoid polyps removed. ablated as described above. colonic lipoma abnormal appearing terminal ileum likely a variant of normal). Howeverwith history  biopsies obtained.    ESOPHAGOGASTRODUODENOSCOPY N/A 03/08/2015   RMR: Hiatal hernia Polypoid gastric mucosa with multiple gastric polyps. largest polyp removed via snare polypectomgy hemostasis clip placed at base. Status post gastric biopsy. Status post video capsule placement.    ESOPHAGOGASTRODUODENOSCOPY (EGD) WITH PROPOFOL N/A 07/02/2023   Procedure: ESOPHAGOGASTRODUODENOSCOPY (EGD) WITH PROPOFOL;  Surgeon: Franky Macho, MD;  Location: [facility];  Service: Endoscopy;  Laterality: N/A;   GASTROJEJUNOSTOMY N/A 07/07/2023   Procedure: GASTROJEJUNOSTOMY;  Surgeon: Lewie Chamber, DO;  Location: [facility];  Service: General;  Laterality: N/A;   GIVENS CAPSULE STUDY N/A 03/08/2015   Procedure: GIVENS CAPSULE STUDY;  Surgeon:  Corbin Ade, MD;  [facility];  Service: Endoscopy;  Laterality: N/A;   GOLD SEED IMPLANT N/A 09/08/2022   Procedure: GOLD SEED IMPLANT;  Surgeon: Malen Gauze, MD;  Location: [facility];  Service: Urology;  Laterality: N/A;   LIMBAL STEM CELL TRANSPLANT     LUNG BIOPSY  6/06   MULTIPLE TOOTH EXTRACTIONS  04/2005   port a cath placement     PORT-A-CATH REMOVAL  09/08/2012   Procedure: REMOVAL PORT-A-CATH;  Surgeon: Loreli Slot, MD;  Location: [facility];  Service: Thoracic;  Laterality: N/A;   SPACE OAR INSTILLATION N/A 09/08/2022   Procedure: SPACE OAR INSTILLATION;  Surgeon: Malen Gauze, MD;  Location: [facility];  Service: Urology;  Laterality: N/A;     reports that he quit smoking about 20 years ago. His smoking use included cigarettes. He started smoking about 35 years ago. He has a 7.5 pack-year smoking history. He has never used smokeless tobacco. He reports that he does not drink alcohol and does not use drugs.  Allergies  Allergen Reactions   Penicillins Rash    Family History  Problem Relation Age of Onset   Cancer Mother        lung   Hypertension Mother    Hyperlipidemia Mother    Cancer Father        prostate   Hypertension Father    Colon cancer Neg Hx    Diabetes Neg Hx     Prior to Admission medications   Medication Sig Start Date End Date Taking? Authorizing Provider  apixaban (ELIQUIS) 5 MG TABS tablet Take 1 tablet (5 mg total) by mouth 2 (two) times daily. 01/24/18   Laqueta Linden, MD  bisacodyl (DULCOLAX) 10 MG suppository Place 1 suppository (10 mg total) rectally daily as needed for moderate constipation. 07/15/23   Vassie Loll, MD  Blood Glucose Monitoring Suppl (ONE TOUCH ULTRA 2) w/Device KIT USE TO TEST BLOOD SUGAR ONCE D UTD 05/25/19   [provider]  feeding supplement (ENSURE ENLIVE / ENSURE PLUS) LIQD Take 237 mLs by mouth 3 (three) times daily between meals. 07/15/23   Vassie Loll, MD  furosemide (LASIX)  20 MG tablet Take 1 tablet (20 mg total) by mouth daily as needed for fluid or edema. 07/30/22   Strader, [place] M, PA-C  Lancets (ONETOUCH DELICA PLUS LANCET33G) MISC USE TO TEST TWICE DAILY 11/24/22   Dani Gobble, NP  metoprolol succinate (TOPROL XL) 25 MG 24 hr tablet Take 1 tablet (25 mg total) by mouth daily. Take with or immediately following a meal. 07/15/23 07/14/24  Vassie Loll, MD  [facility] ULTRA test strip TEST TWICE DAILY 11/22/20   Roma Kayser, MD  oxyCODONE (OXY IR/ROXICODONE) 5 MG immediate release tablet Take 1 tablet (5 mg total) by mouth every 6 (six) hours as needed for severe pain (pain score 7-10). 07/15/23   Vassie Loll, MD  polyethylene glycol (MIRALAX / GLYCOLAX) 17 g packet Take 17 g by mouth daily. 07/16/23   Vassie Loll, MD  potassium chloride (KLOR-CON) 10 MEQ tablet Take 10 meq on days you need to take Lasix Patient taking differently: 10 mEq. Take 10 meq on days you  need to take Lasix 08/07/22   Strader, [place] M, PA-C  prochlorperazine (COMPAZINE) 10 MG tablet Take 1 tablet (10 mg total) by mouth every 6 (six) hours as needed for nausea or vomiting. 07/15/23   Vassie Loll, MD  rosuvastatin (CRESTOR) 40 MG tablet Take 40 mg by mouth daily. 05/04/19   [provider]  simethicone (MYLICON) 80 MG chewable tablet Chew 1 tablet (80 mg total) by mouth every 6 (six) hours as needed for flatulence (obstipation). 07/15/23   Vassie Loll, MD  torsemide (DEMADEX) 20 MG tablet Take 20 mg by mouth daily. 07/17/23   [provider]  Vitamin D, Ergocalciferol, (DRISDOL) 1.25 MG (50000 UNIT) CAPS capsule TAKE 1 CAPSULE BY MOUTH ONCE WEEKLY 08/18/22   Doreatha Massed, MD    Physical Exam: Vitals:   07/19/23 1329 07/19/23 1330  BP: (!) 122/59   Pulse: 88   Resp: 14   Temp: 98.1 F (36.7 C)   TempSrc: Oral   SpO2: 100%   Weight:  90.5 kg  Height:  5\' 11"  (1.803 m)    Constitutional: NAD, calm, comfortable Vitals:    07/19/23 1329 07/19/23 1330  BP: (!) 122/59   Pulse: 88   Resp: 14   Temp: 98.1 F (36.7 C)   TempSrc: Oral   SpO2: 100%   Weight:  90.5 kg  Height:  5\' 11"  (1.803 m)   Eyes: PERRL, lids and conjunctivae normal ENMT: Mucous membranes are moist. Posterior pharynx clear of any exudate or lesions.poor dentition. Neck: normal, supple, no masses, no thyromegaly Respiratory: clear to auscultation bilaterally, no wheezing, no crackles. Normal respiratory effort. No accessory muscle use.  Cardiovascular: Regular rate and rhythm, no murmurs / rubs / gallops. No extremity edema. 2+ pedal pulses. No carotid bruits.  Abdomen: no tenderness, no masses palpated. No hepatosplenomegaly. Bowel sounds positive.  Healing surgical incision to epigastric region, sutures present. Musculoskeletal: Left upper extremity bigger than right upper extremity with swelling. No joint deformity upper and lower extremities. Good ROM, no contractures. Normal muscle tone.  Skin: no rashes, lesions, ulcers. No induration Neurologic: No facial symmetry, speech fluent, moving extremities spontaneously. Psychiatric: Normal judgment and insight. Alert and oriented x 3. Normal mood.   Labs on Admission: I have personally reviewed following labs and imaging studies  CBC: Recent Labs  Lab 07/13/23 0442 07/19/23 1338  WBC 7.2 6.4  NEUTROABS  --  5.2  HGB 10.7* 6.2*  HCT 34.8* 19.9*  MCV 97.2 101.5*  PLT 120* 234   Basic Metabolic Panel: Recent Labs  Lab 07/13/23 0442 07/14/23 0407 07/19/23 1338  NA 140 139 142  K 3.8 3.5 3.7  CL 104 104 106  CO2 25 25 26   GLUCOSE 95 97 109*  BUN 11 14 20   CREATININE 1.02 1.23 1.12  CALCIUM 8.3* 8.4* 8.5*  MG 1.6* 2.1  --   PHOS 2.1*  --   --    CBG: Recent Labs  Lab 07/14/23 2000 07/14/23 2303 07/15/23 0432 07/15/23 0723 07/15/23 1109  GLUCAP 96 97 98 73 103*   Urine analysis:    Component Value Date/Time   COLORURINE YELLOW 07/09/2023 1840   APPEARANCEUR HAZY  (A) 07/09/2023 1840   APPEARANCEUR Clear 05/03/2023 1056   LABSPEC 1.020 07/09/2023 1840   PHURINE 5.0 07/09/2023 1840   GLUCOSEU NEGATIVE 07/09/2023 1840   HGBUR SMALL (A) 07/09/2023 1840   BILIRUBINUR NEGATIVE 07/09/2023 1840   BILIRUBINUR Negative 05/03/2023 1056   KETONESUR 5 (A) 07/09/2023 1840  PROTEINUR 100 (A) 07/09/2023 1840   NITRITE NEGATIVE 07/09/2023 1840   LEUKOCYTESUR NEGATIVE 07/09/2023 1840    Radiological Exams on Admission: No results found.  EKG: None    Assessment/Plan Principal Problem:   GI bleed Active Problems:   Superficial vein thrombosis   DIASTOLIC HEART FAILURE, CHRONIC   Essential hypertension, benign   Type 2 diabetes mellitus with complication, without long-term current use of insulin (HCC)   Atrial flutter (HCC)   Gastric outlet obstruction   Gastric adenocarcinoma (HCC)  Assessment and Plan: * GI bleed Presenting with melena over the past few days, hemoglobin down to 6.4, from baseline 9-10.  On anticoagulation with Eliquis.  No hematochezia or hematemesis.  Delay in getting blood due to presence of antibodies. Theodoro Parma and ANDEXXA given in ED -Transfuse 2 units PRBC - N/s 75cc/hr x 12hrs -IV Protonix 80 given, continue 40 twice daily -N.p.o. midnight -Resume torsemide in a.m.  Superficial vein thrombosis Currently with swelling to left upper extremity compared to right, recent venous Doppler 11/16 shows occlusive thrombus within the cephalic vein at the level of the antecubital fossa, no evidence of DVT.  -Hold Eliquis for now  Gastric adenocarcinoma (HCC) During his recent hospitalization, presented with gastric outlet obstruction, EGD revealed large circumferential fungating malignant gastric tumor at the pylorus and in the gastric antrum-consistent with invasive adenocarcinoma with signet ring cell features. -Dr. Ellin Saba consulted, patient wants to pursue chemotherapy -Gastrojejunostomy 11/13 by Dr. Robyne Peers, UGI study  11/18---Patent gastrojejunostomy anastomosis without evidence of stricture or contrast extravasation. -Currently on soft diet  Atrial flutter (HCC) Rate controlled and on anticoagulation with Eliquis. -Hold Eliquis for now pending GI workup -Hold metoprolol in setting of GI bleed  Type 2 diabetes mellitus with complication, without long-term current use of insulin (HCC) A1c 4.6.  Controlled.  Not on medication. - monitor for now   Essential hypertension, benign Stable. -Hold metoprolol and diuretics in the setting of GI bleed  DIASTOLIC HEART FAILURE, CHRONIC Stable and compensated.  Echo 07/2022-EF of 55 to 60%, grade 2 DD. -Resume torsemide 20 mg daily in a.m.    DVT prophylaxis: SCDS Code Status: FULL-confirmed with patient and spouse at bedside Family Communication: Spouse at bedside Disposition Plan: ~ 2 days Consults called: GI, Gen Surg Admission status: INpt Tele I certify that at the point of admission it is my clinical judgment that the patient will require inpatient hospital care spanning beyond 2 midnights from the point of admission due to high intensity of service, high risk for further deterioration and high frequency of surveillance required.    Author: Onnie Boer, MD 07/19/2023 6:16 PM  For on call review [email or web address].

## 2023-07-19 NOTE — Assessment & Plan Note (Addendum)
Stable and compensated.  Echo 07/2022-EF of 55 to 60%, grade 2 DD. -Resume torsemide 20 mg daily in a.m.

## 2023-07-19 NOTE — Consult Note (Cosign Needed Addendum)
Gastroenterology Consult   Referring Provider: [facility] Primary Care Physician:  [name], MD Primary Gastroenterologist:  Dr. Jena Gauss   Patient ID: Jacaleb Puhr.; [id number]; 10/28/1950   Admit date: 07/19/2023  LOS: 0 days   Date of Consultation: 07/19/2023  Reason for Consultation:  Acute on chronic anemia   History of Present Illness   Stace Clohessy. is a 72 y.o. year old male with history of HTN, aflutter on Eliquis, CHF, DM, diffuse large B-cell lymphoma s/p stem cell transplant in 2007, IDA, prostate cancer s/p radiation, recent diagnosis of gastric cancer with gastric outlet obstruction in early Nov 2024 s/p palliative gastrojejunostomy on 07/07/23, occlusive thrombus in left cephalic vein diagnosed on Jul 10, 2023 while inpatient, presenting here from [facility] due to acute on chronic anemia and melena. He has been on Eliquis BID with last dose this morning per rehab facility record.   Wife at bedside. Patient notes fatigue. No N/V. No abdominal pain. Limited historian. He has been on clear liquids since discharge from [facility] on 11/21.   Hgb 6.2 in the ED. Hgb actually in 5 range per labs from [place].  Received Andexxa. Heme positive stool. Currently receiving first of 3 units PRBCs.   Dr. Robyne Peers aware of patient and will see tomorrow. Does not feel acute blood loss emanating from anastomosis.   Office visit with Oncology upcoming on 12/2. At time of surgery, he did have a mesenteric deposit s/p biopsy with path reporting metastatic poorly differentiated carcinoma.      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   autologous stem cell  transplant 12/2005   Obesity, morbid (more than 100 lbs over ideal weight or BMI > 40) (HCC)    Venous insufficiency 04/29/2011    Past Surgical History:  Procedure Laterality Date   BACK SURGERY  2006   T6 vertebrae removed/titanium placed   BIOPSY  07/02/2023   Procedure: BIOPSY;  Surgeon: Franky Macho, MD;  [facility];  Service: Endoscopy;;   CATARACT EXTRACTION W/PHACO Right 03/31/2019   Procedure: CATARACT EXTRACTION PHACO AND INTRAOCULAR LENS PLACEMENT (IOC);  Surgeon: Fabio Pierce, MD;  Location: [facility];  Service: Ophthalmology;  Laterality: Right;  CDE: 3.21   CATARACT EXTRACTION W/PHACO Left 04/14/2019   Procedure: CATARACT EXTRACTION PHACO AND INTRAOCULAR LENS PLACEMENT (IOC);  Surgeon: Fabio Pierce, MD;  Location: [facility];  Service: Ophthalmology;  Laterality: Left;  left - pt knows to arrive at 7:45, CDE: 3.55   COLONOSCOPY  11/24/2004   Polyps in the left colon ablated/removed as described above.  Two submucosal lesions consistent with lipomas as described above not  manipulated./ Normal rectum   COLONOSCOPY  06/20/2012   MULTIPLE RECTAL AND COLONIC POLYPS   COLONOSCOPY N/A 03/08/2015   RMR: Capacious, redundant colon. Multiple colonic and rectosigmoid polyps removed. ablated as described above. colonic lipoma abnormal appearing terminal ileum likely a variant of normal). Howeverwith history  biopsies obtained.    ESOPHAGOGASTRODUODENOSCOPY N/A 03/08/2015   RMR: Hiatal hernia Polypoid gastric mucosa with multiple gastric polyps. largest polyp removed via snare polypectomgy hemostasis  clip placed at base. Status post gastric biopsy. Status post video capsule placement.    ESOPHAGOGASTRODUODENOSCOPY (EGD) WITH PROPOFOL N/A 07/02/2023   Procedure: ESOPHAGOGASTRODUODENOSCOPY (EGD) WITH PROPOFOL;  Surgeon: Franky Macho, MD;  [facility];  Service: Endoscopy;  Laterality: N/A;   GASTROJEJUNOSTOMY N/A 07/07/2023   Procedure: GASTROJEJUNOSTOMY;  Surgeon:  Lewie Chamber, DO;  Location: [facility];  Service: General;  Laterality: N/A;   GIVENS CAPSULE STUDY N/A 03/08/2015   Procedure: GIVENS CAPSULE STUDY;  Surgeon: Corbin Ade, MD;  [facility];  Service: Endoscopy;  Laterality: N/A;   GOLD SEED IMPLANT N/A 09/08/2022   Procedure: GOLD SEED IMPLANT;  Surgeon: Malen Gauze, MD;  Location: [facility];  Service: Urology;  Laterality: N/A;   LIMBAL STEM CELL TRANSPLANT     LUNG BIOPSY  6/06   MULTIPLE TOOTH EXTRACTIONS  04/2005   port a cath placement     PORT-A-CATH REMOVAL  09/08/2012   Procedure: REMOVAL PORT-A-CATH;  Surgeon: Loreli Slot, MD;  Location: [facility] OR;  Service: Thoracic;  Laterality: N/A;   SPACE OAR INSTILLATION N/A 09/08/2022   Procedure: SPACE OAR INSTILLATION;  Surgeon: Malen Gauze, MD;  Location: [facility];  Service: Urology;  Laterality: N/A;    Prior to Admission medications   Medication Sig Start Date End Date Taking? Authorizing Provider  apixaban (ELIQUIS) 5 MG TABS tablet Take 1 tablet (5 mg total) by mouth 2 (two) times daily. 01/24/18   Laqueta Linden, MD  bisacodyl (DULCOLAX) 10 MG suppository Place 1 suppository (10 mg total) rectally daily as needed for moderate constipation. 07/15/23   Vassie Loll, MD  Blood Glucose Monitoring Suppl (ONE TOUCH ULTRA 2) w/Device KIT USE TO TEST BLOOD SUGAR ONCE D UTD 05/25/19   [provider]  feeding supplement (ENSURE ENLIVE / ENSURE PLUS) LIQD Take 237 mLs by mouth 3 (three) times daily between meals. 07/15/23   Vassie Loll, MD  furosemide (LASIX) 20 MG tablet Take 1 tablet (20 mg total) by mouth daily as needed for fluid or edema. 07/30/22   Strader, [place] M, PA-C  Lancets (ONETOUCH DELICA PLUS LANCET33G) MISC USE TO TEST TWICE DAILY 11/24/22   Dani Gobble, NP  metoprolol succinate (TOPROL XL) 25 MG 24 hr tablet Take 1 tablet (25 mg total) by mouth daily. Take with or immediately following a meal. 07/15/23 07/14/24  Vassie Loll, MD  [facility] ULTRA test strip TEST TWICE DAILY 11/22/20   Roma Kayser, MD  oxyCODONE (OXY IR/ROXICODONE) 5 MG immediate release tablet Take 1 tablet (5 mg total) by mouth every 6 (six) hours as needed for severe pain (pain score 7-10). 07/15/23   Vassie Loll, MD  polyethylene glycol (MIRALAX / GLYCOLAX) 17 g packet Take 17 g by mouth daily. 07/16/23   Vassie Loll, MD  potassium chloride (KLOR-CON) 10 MEQ tablet Take 10 meq on days you need to take Lasix Patient taking differently: 10 mEq. Take 10 meq on days you need to take Lasix 08/07/22   [place] Ouch, [place] M, PA-C  prochlorperazine (COMPAZINE) 10 MG tablet Take 1 tablet (10 mg total) by mouth every 6 (six) hours as needed for nausea or vomiting. 07/15/23   Vassie Loll, MD  rosuvastatin (CRESTOR) 40 MG tablet Take 40 mg by mouth daily. 05/04/19   [provider]  simethicone (MYLICON) 80 MG chewable tablet Chew 1 tablet (80 mg total) by mouth every 6 (six) hours as needed for flatulence (obstipation). 07/15/23  Vassie Loll, MD  Vitamin D, Ergocalciferol, (DRISDOL) 1.25 MG (50000 UNIT) CAPS capsule TAKE 1 CAPSULE BY MOUTH ONCE WEEKLY 08/18/22   Doreatha Massed, MD    Current Facility-Administered Medications  Medication Dose Route Frequency Provider Last Rate Last Admin   0.9 %  sodium chloride infusion (Manually program via Guardrails IV Fluids)   Intravenous Once Beatty, Celeste A, PA-C       coag fact Xa recombinant (ANDEXXA) low dose infusion 900 mg  900 mg Intravenous Once Bethann Berkshire, MD       Current Outpatient Medications  Medication Sig Dispense Refill   apixaban (ELIQUIS) 5 MG TABS tablet Take 1 tablet (5 mg total) by mouth 2 (two) times daily. 60 tablet 11   bisacodyl (DULCOLAX) 10 MG suppository Place 1 suppository (10 mg total) rectally daily as needed for moderate constipation.     Blood Glucose Monitoring Suppl (ONE TOUCH ULTRA 2) w/Device KIT USE TO TEST BLOOD SUGAR ONCE D UTD      feeding supplement (ENSURE ENLIVE / ENSURE PLUS) LIQD Take 237 mLs by mouth 3 (three) times daily between meals.     furosemide (LASIX) 20 MG tablet Take 1 tablet (20 mg total) by mouth daily as needed for fluid or edema. 90 tablet 2   Lancets (ONETOUCH DELICA PLUS LANCET33G) MISC USE TO TEST TWICE DAILY 300 each 3   metoprolol succinate (TOPROL XL) 25 MG 24 hr tablet Take 1 tablet (25 mg total) by mouth daily. Take with or immediately following a meal.     ONETOUCH ULTRA test strip TEST TWICE DAILY 100 strip 3   oxyCODONE (OXY IR/ROXICODONE) 5 MG immediate release tablet Take 1 tablet (5 mg total) by mouth every 6 (six) hours as needed for severe pain (pain score 7-10). 30 tablet 0   polyethylene glycol (MIRALAX / GLYCOLAX) 17 g packet Take 17 g by mouth daily.     potassium chloride (KLOR-CON) 10 MEQ tablet Take 10 meq on days you need to take Lasix (Patient taking differently: 10 mEq. Take 10 meq on days you need to take Lasix) 90 tablet 1   prochlorperazine (COMPAZINE) 10 MG tablet Take 1 tablet (10 mg total) by mouth every 6 (six) hours as needed for nausea or vomiting.     rosuvastatin (CRESTOR) 40 MG tablet Take 40 mg by mouth daily.     simethicone (MYLICON) 80 MG chewable tablet Chew 1 tablet (80 mg total) by mouth every 6 (six) hours as needed for flatulence (obstipation).     Vitamin D, Ergocalciferol, (DRISDOL) 1.25 MG (50000 UNIT) CAPS capsule TAKE 1 CAPSULE BY MOUTH ONCE WEEKLY 16 capsule 3    Allergies as of 07/19/2023 - Review Complete 07/19/2023  Allergen Reaction Noted   Penicillins Rash 04/29/2011    Family History  Problem Relation Age of Onset   Cancer Mother        lung   Hypertension Mother    Hyperlipidemia Mother    Cancer Father        prostate   Hypertension Father    Colon cancer Neg Hx    Diabetes Neg Hx     Social History   Socioeconomic History   Marital status: Married    Spouse name: Not on file   Number of children: 2   Years of education: Not  on file   Highest education level: Not on file  Occupational History   Occupation: disability due to back    Employer: UNEMPLOYED  Tobacco Use  Smoking status: Former    Current packs/day: 0.00    Average packs/day: 0.5 packs/day for 15.0 years (7.5 ttl pk-yrs)    Types: Cigarettes    Start date: 11/11/1987    Quit date: 11/11/2002    Years since quitting: 20.6   Smokeless tobacco: Never  Vaping Use   Vaping status: Never Used  Substance and Sexual Activity   Alcohol use: No    Alcohol/week: 0.0 standard drinks of alcohol   Drug use: No   Sexual activity: Yes    Birth control/protection: None  Other Topics Concern   Not on file  Social History Narrative   Married   No regular exercise   Social Determinants of Health   Financial Resource Strain: Low Risk  (06/28/2020)   Overall Financial Resource Strain (CARDIA)    Difficulty of Paying Living Expenses: Not hard at all  Food Insecurity: No Food Insecurity (07/01/2023)   Hunger Vital Sign    Worried About Running Out of Food in the Last Year: Never true    Ran Out of Food in the Last Year: Never true  Transportation Needs: No Transportation Needs (07/01/2023)   PRAPARE - Administrator, Civil Service (Medical): No    Lack of Transportation (Non-Medical): No  Physical Activity: Inactive (06/28/2020)   Exercise Vital Sign    Days of Exercise per Week: 0 days    Minutes of Exercise per Session: 0 min  Stress: No Stress Concern Present (06/28/2020)   [employer] of Occupational Health - Occupational Stress Questionnaire    Feeling of Stress : Not at all  Social Connections: Moderately Integrated (06/28/2020)   Social Connection and Isolation Panel [NHANES]    Frequency of Communication with Friends and Family: More than three times a week    Frequency of Social Gatherings with Friends and Family: Twice a week    Attends Religious Services: More than 4 times per year    Active Member of [employer] or Organizations:  No    Attends Banker Meetings: Never    Marital Status: Married  Catering manager Violence: Not At Risk (07/01/2023)   Humiliation, Afraid, Rape, and Kick questionnaire    Fear of Current or Ex-Partner: No    Emotionally Abused: No    Physically Abused: No    Sexually Abused: No     Review of Systems   Gen: Denies any fever, chills, loss of appetite, change in weight or weight loss CV: Denies chest pain, heart palpitations, syncope, edema  Resp: Denies shortness of breath with rest, cough, wheezing, coughing up blood, and pleurisy. GI: Denies vomiting blood, jaundice, and fecal incontinence.   Denies dysphagia or odynophagia. GU : Denies urinary burning, blood in urine, urinary frequency, and urinary incontinence. MS: Denies joint pain, limitation of movement, swelling, cramps, and atrophy.  Derm: Denies rash, itching, dry skin, hives. Psych: Denies depression, anxiety, memory loss, hallucinations, and confusion. Heme: Denies bruising or bleeding Neuro:  Denies any headaches, dizziness, paresthesias, shaking  Physical Exam   Vital Signs in last 24 hours: Temp:  [98.1 F (36.7 C)] 98.1 F (36.7 C) (11/25 1329) Pulse Rate:  [88] 88 (11/25 1329) Resp:  [14] 14 (11/25 1329) BP: (122)/(59) 122/59 (11/25 1329) SpO2:  [100 %] 100 % (11/25 1329) Weight:  [90.5 kg] 90.5 kg (11/25 1330)    General:   Alert, frail-appearing, weak Head:  Normocephalic and atraumatic. Mouth:  poor dentition  Neck:  Supple; no masses Lungs:  Clear throughout  to auscultation.    Heart:  S1 S2 present  Abdomen:  Soft, nontender and nondistended. No masses, hepatosplenomegaly or hernias noted. Normal bowel sounds, without guarding, and without rebound.  Midline incision with staples are C/D/I Rectal: deferred   Msk:  Symmetrical without gross deformities. Normal posture. Extremities:  With LUE edema  Neurologic:  Alert and  oriented x4. Psych:  Alert and cooperative. Normal mood and  affect.  Intake/Output from previous day: No intake/output data recorded. Intake/Output this shift: No intake/output data recorded.   Labs/Studies   Recent Labs Recent Labs    07/19/23 1338  WBC 6.4  HGB 6.2*  HCT 19.9*  PLT 234   BMET Recent Labs    07/19/23 1338  NA 142  K 3.7  CL 106  CO2 26  GLUCOSE 109*  BUN 20  CREATININE 1.12  CALCIUM 8.5*     Radiology/Studies No results found.   Assessment   Deanglo Trumbauer. is a 72 y.o. year old male  withhistory of HTN, aflutter on Eliquis, CHF, DM, diffuse large B-cell lymphoma s/p stem cell transplant in 2007, IDA, prostate cancer s/p radiation, recent diagnosis of gastric cancer with gastric outlet obstruction in early Nov 2024 s/p palliative gastrojejunostomy on 07/07/23, occlusive thrombus in left cephalic vein diagnosed on Jul 10, 2023 while inpatient, presenting here from [facility] due to acute on chronic anemia and melena in setting of Eliquis.  Hgb 5 as outpatient and 6.2 on presentation to ED, currently receiving 1st of 3 units PRBCs. Suspect acute blood loss anemia due to known gastric mass in setting of ongoing anticoagulation. Although s/p gastrojejunostomy, the mass is still accessible as only the pylorus was bypassed and no division of stomach.   Received reversal agent for Eliquis today. Will allow clear liquids. Evaluate again in am. Timing of EGD to be determined. Limited options available endoscopically as will only provide short-term management (e.g. hemospray). Patient will likely continue to have ongoing blood loss with known mass and has upcoming Oncology appt on 12/2, which will be very beneficial. Less likely bleeding from anastomosis. Appreciate surgery input.     Plan / Recommendations    Clear liquid diet NPO at midnight PPI BID Timing of EGD to be determined Recommend palliative consultation while inpatient.      07/19/2023, 2:53 PM  Gelene Mink, PhD, ANP-BC [facility]

## 2023-07-19 NOTE — Assessment & Plan Note (Addendum)
Presenting with melena over the past few days, hemoglobin down to 6.4, from baseline 9-10.  On anticoagulation with Eliquis.  No hematochezia or hematemesis.  Delay in getting blood due to presence of antibodies. Theodoro Parma and ANDEXXA given in ED -Transfuse 2 units PRBC - N/s 75cc/hr x 12hrs -IV Protonix 80 given, continue 40 twice daily -N.p.o. midnight -Resume torsemide in a.m.

## 2023-07-19 NOTE — ED Triage Notes (Signed)
Pt BIB ems from [place] for low hemoglobin. Per facility pt had labs done yesterday and hemoglobin showed 5.4, HCT 16. Facility sent over for confirmation and blood administration.

## 2023-07-19 NOTE — Assessment & Plan Note (Addendum)
A1c 4.6.  Controlled.  Not on medication. - monitor for now

## 2023-07-19 NOTE — Assessment & Plan Note (Signed)
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 -Dr. Ellin Saba consulted, patient wants to pursue chemotherapy -Gastrojejunostomy 11/13 by Dr. Robyne Peers, UGI study 11/18---Patent gastrojejunostomy anastomosis without evidence of stricture or contrast extravasation. -Currently on soft diet

## 2023-07-19 NOTE — Assessment & Plan Note (Addendum)
Rate controlled and on anticoagulation with Eliquis. -Hold Eliquis for now pending GI workup -Hold metoprolol in setting of GI bleed

## 2023-07-19 NOTE — ED Provider Notes (Signed)
[place] EMERGENCY DEPARTMENT AT [facility] Provider Note   CSN: [id number] Arrival date & time: 07/19/23  1313     History  Chief Complaint  Patient presents with   Low Hemoglobin    Frank Leff. is a 72 y.o. male. 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  He presents to the ER for evaluation of low hemoglobin from [facility].  He was just discharged from the hospital 4 days ago.  He had been admitted for gastric outlet obstruction.  He had an endoscopy with GI on 11/8 with finding of large circumferential malignant gastric tumor at the pylorus and had biopsy.  He was seen in the hospital by oncology, family was planning on pursuing chemotherapy.  He had a gastojeujenostomy 11/13, restarted his Eliquis on 11/17.  He was found to have a superficial vein thrombus in the cephalic vein during hospitalization as well.  He notes he is felt very weak since leaving the hospital, gets tired easily when doing his therapy at the rehab.  Not noticed any GI bleeding.  No vomiting or abdominal pain.  HPI     Home Medications Prior to Admission medications   Medication Sig Start Date End Date Taking? Authorizing Provider  apixaban (ELIQUIS) 5 MG TABS tablet Take 1 tablet (5 mg total) by mouth 2 (two) times daily. 01/24/18   Laqueta Linden, MD  bisacodyl (DULCOLAX) 10 MG suppository Place 1 suppository (10 mg total) rectally daily as needed for moderate constipation. 07/15/23   Vassie Loll, MD  Blood Glucose Monitoring Suppl (ONE TOUCH ULTRA 2) w/Device KIT USE TO TEST BLOOD SUGAR ONCE D UTD 05/25/19   [provider]  feeding  supplement (ENSURE ENLIVE / ENSURE PLUS) LIQD Take 237 mLs by mouth 3 (three) times daily between meals. 07/15/23   Vassie Loll, MD  furosemide (LASIX) 20 MG tablet Take 1 tablet (20 mg total) by mouth daily as needed for fluid or edema. 07/30/22   Strader, [place] M, PA-C  Lancets (ONETOUCH DELICA PLUS LANCET33G) MISC USE TO TEST TWICE DAILY 11/24/22   Dani Gobble, NP  metoprolol succinate (TOPROL XL) 25 MG 24 hr tablet Take 1 tablet (25 mg total) by mouth daily. Take with or immediately following a meal. 07/15/23 07/14/24  Vassie Loll, MD  [facility] ULTRA test strip TEST TWICE DAILY 11/22/20   Roma Kayser, MD  oxyCODONE (OXY IR/ROXICODONE) 5 MG immediate release tablet Take 1 tablet (5 mg total) by mouth every 6 (six) hours as needed for severe pain (pain score 7-10). 07/15/23   Vassie Loll, MD  polyethylene glycol (MIRALAX / GLYCOLAX) 17 g packet Take 17 g by mouth daily. 07/16/23   Vassie Loll, MD  potassium chloride (KLOR-CON) 10 MEQ tablet Take 10 meq on days you need to take Lasix Patient taking differently: 10 mEq. Take 10 meq on days you need to take Lasix 08/07/22   [place] Ouch, [place] M, PA-C  prochlorperazine (COMPAZINE) 10 MG tablet Take 1 tablet (10 mg total) by mouth every 6 (six) hours as needed for nausea or vomiting. 07/15/23   Vassie Loll, MD  rosuvastatin (CRESTOR) 40 MG tablet Take 40 mg by mouth daily. 05/04/19   [provider]  simethicone (MYLICON) 80 MG chewable tablet Chew 1 tablet (80 mg total) by mouth every 6 (six) hours as needed for flatulence (obstipation). 07/15/23   Vassie Loll, MD  torsemide (DEMADEX) 20 MG tablet Take 20 mg by mouth daily. 07/17/23   [provider]  Vitamin D, Ergocalciferol, (DRISDOL) 1.25 MG (50000 UNIT) CAPS capsule TAKE 1 CAPSULE BY MOUTH ONCE WEEKLY 08/18/22   Doreatha Massed, MD      Allergies    Penicillins    Review of Systems   Review of Systems  Physical Exam Updated Vital  Signs BP (!) 122/59 (BP Location: Right Arm)   Pulse 88   Temp 98.1 F (36.7 C) (Oral)   Resp 14   Ht 5\' 11"  (1.803 m)   Wt 90.5 kg   SpO2 100%   BMI 27.83 kg/m  Physical Exam Vitals and nursing note reviewed.  Constitutional:      General: He is not in acute distress.    Appearance: He is well-developed.  HENT:     Head: Normocephalic and atraumatic.  Eyes:     Conjunctiva/sclera: Conjunctivae normal.  Cardiovascular:     Rate and Rhythm: Normal rate and regular rhythm.     Heart sounds: No murmur heard. Pulmonary:     Effort: Pulmonary effort is normal. No respiratory distress.     Breath sounds: Normal breath sounds.  Abdominal:     Palpations: Abdomen is soft.     Tenderness: There is no abdominal tenderness.  Genitourinary:    Rectum: Guaiac result positive. No tenderness, external hemorrhoid or internal hemorrhoid.     Comments: Stool is melanotic Musculoskeletal:        General: No swelling.     Cervical back: Neck supple.  Skin:    General: Skin is warm and dry.     Capillary Refill: Capillary refill takes less than 2 seconds.  Neurological:     Mental Status: He is alert.  Psychiatric:        Mood and Affect: Mood normal.     ED Results / Procedures / Treatments   Labs (all labs ordered are listed, but only abnormal results are displayed) Labs Reviewed  CBC WITH DIFFERENTIAL/PLATELET - Abnormal; Notable for the following components:      Result Value   RBC 1.96 (*)    Hemoglobin 6.2 (*)    HCT 19.9 (*)    MCV 101.5 (*)    RDW 16.5 (*)    nRBC 0.6 (*)    Lymphs Abs 0.5 (*)    All other components within normal limits  BASIC METABOLIC PANEL - Abnormal; Notable for the following components:   Glucose, Bld 109 (*)    Calcium 8.5 (*)    All other components within normal limits  POC OCCULT BLOOD, ED - Abnormal; Notable for the following components:   Fecal Occult Bld POSITIVE (*)    All other components within normal limits  TYPE AND SCREEN   PREPARE RBC (CROSSMATCH)    EKG None  Radiology No results found.  Procedures .Critical Care  Performed by: Ma Rings, PA-C Authorized by: Ma Rings, PA-C   Critical care provider statement:    Critical care time (minutes):  35   Critical care was time spent personally by me on the following activities:  Development of treatment plan with patient or surrogate, discussions with consultants, evaluation of patient's response to treatment, examination of patient, ordering and review of laboratory studies, ordering and review of radiographic  studies, ordering and performing treatments and interventions, pulse oximetry, re-evaluation of patient's condition, review of old charts and obtaining history from patient or surrogate   Care discussed with: admitting provider       Medications Ordered in ED Medications  0.9 %  sodium chloride infusion (Manually program via Guardrails IV Fluids) (has no administration in time range)  pantoprazole (PROTONIX) injection 80 mg (80 mg Intravenous Given 07/19/23 1446)  coag fact Xa recombinant (ANDEXXA) low dose infusion 900 mg (900 mg Intravenous New Bag/Given 07/19/23 1511)    ED Course/ Medical Decision Making/ A&P                                 Medical Decision Making This patient presents to the ED for concern of low hemoglobin, this involves an extensive number of treatment options, and is a complaint that carries with it a high risk of complications and morbidity.  The differential diagnosis includes PUD, bleeding gastric cancer, lower GI bleed, variceal bleed, Iron deficiency anemia, other   Co morbidities that complicate the patient evaluation :  eliquis use, gastric cancer, iron deficiency anemia, other   Additional history obtained:  Additional history obtained from EMR External records from outside source obtained and reviewed including recent surgery note, discharge summary, labs   Lab Tests:  I Ordered, and  personally interpreted labs.  The pertinent results include:  hgb 6.2, guiac +, renal function at baseline      Cardiac Monitoring: / EKG:  The patient was maintained on a cardiac monitor.  I personally viewed and interpreted the cardiac monitored which showed an underlying rhythm of: atrial flutter on monitor rate controlled   Consultations Obtained:  I requested consultation with GI, spoke with NP Leota Sauers, they will see patient, make patient NPO and will scope tomorrow, agreeable with K Centra and blood transfusion I also spoke with Dr. Robyne Peers who will see patient tomorrow, but feels bleeding is not likely due to recent gastrojeujenostomy surgery, recommends tranfusion and GI consult Dr. Mariea Clonts consulted for admission   Problem List / ED Course / Critical interventions / Medication management  Anemia due to GI bleeding-patient is on Eliquis twice daily for atrial flutter.  Had the labs yesterday that showed hemoglobin was 5.  He denied any known GI bleeding but noted to have melanotic stool on exam that was guaiac positive.  Hemoglobin in the ED today is 6.2.  Last dose of Eliquis was at 9 AM.  He is not having abdominal pain or vomiting but does complain of generalized weakness and fatigue for the past several days.  Heart rate in room noted to be around 100-101, tachycardic due to his acute blood loss anemia.  He is agreeable with transfusion after discussion of risks and benefits and gave verbal consent for transfusion.  This is discussed need to reverse his Eliquis due to continued bleeding, especially given that there will be somewhat of a delay getting his blood here due to antibodies.  3 units of blood are on order and will be transfused.  His blood pressures remained stable.  He has a high risk of decompensation due to GI bleeding and significant anemia especially in setting of multiple comorbid medical conditions.  He is critically ill and needs to be hospitalized.  He was  reevaluated several times in the ER.  I ordered medication including Kcentra for reversal of Eliquis, PRBCs Reevaluation of the patient after  these medicines showed that the patient stayed the same I have reviewed the patients home medicines and have made adjustments as needed       Amount and/or Complexity of Data Reviewed Labs: ordered.           Final Clinical Impression(s) / ED Diagnoses Final diagnoses:  Gastrointestinal hemorrhage, unspecified gastrointestinal hemorrhage type  Anemia, unspecified type    Rx / DC Orders ED Discharge Orders     None         Josem Kaufmann 07/19/23 1612    Bethann Berkshire, MD 07/19/23 1655

## 2023-07-19 NOTE — Assessment & Plan Note (Signed)
Stable. -Hold metoprolol and diuretics in the setting of GI bleed

## 2023-07-19 NOTE — Plan of Care (Signed)
  Problem: Education: Goal: Knowledge of General Education information will improve Description: Including pain rating scale, medication(s)/side effects and non-pharmacologic comfort measures Outcome: Progressing   Problem: Nutrition: Goal: Adequate nutrition will be maintained Outcome: Not Progressing   Problem: Pain Management: Goal: General experience of comfort will improve Outcome: Progressing   Problem: Safety: Goal: Ability to remain free from injury will improve Outcome: Progressing

## 2023-07-20 ENCOUNTER — Inpatient Hospital Stay (HOSPITAL_COMMUNITY): Payer: Medicare Other | Admitting: Registered Nurse

## 2023-07-20 ENCOUNTER — Encounter (HOSPITAL_COMMUNITY): Admission: EM | Disposition: E | Payer: Self-pay | Source: Home / Self Care | Attending: Internal Medicine

## 2023-07-20 ENCOUNTER — Inpatient Hospital Stay (HOSPITAL_COMMUNITY): Payer: Medicare Other

## 2023-07-20 ENCOUNTER — Encounter (HOSPITAL_COMMUNITY): Payer: Self-pay | Admitting: Internal Medicine

## 2023-07-20 DIAGNOSIS — I4892 Unspecified atrial flutter: Secondary | ICD-10-CM | POA: Diagnosis not present

## 2023-07-20 DIAGNOSIS — I82890 Acute embolism and thrombosis of other specified veins: Secondary | ICD-10-CM | POA: Diagnosis not present

## 2023-07-20 DIAGNOSIS — R579 Shock, unspecified: Secondary | ICD-10-CM

## 2023-07-20 DIAGNOSIS — K922 Gastrointestinal hemorrhage, unspecified: Secondary | ICD-10-CM | POA: Diagnosis not present

## 2023-07-20 DIAGNOSIS — C163 Malignant neoplasm of pyloric antrum: Secondary | ICD-10-CM

## 2023-07-20 DIAGNOSIS — D62 Acute posthemorrhagic anemia: Secondary | ICD-10-CM | POA: Diagnosis not present

## 2023-07-20 DIAGNOSIS — J9601 Acute respiratory failure with hypoxia: Secondary | ICD-10-CM | POA: Diagnosis not present

## 2023-07-20 DIAGNOSIS — A419 Sepsis, unspecified organism: Secondary | ICD-10-CM | POA: Diagnosis not present

## 2023-07-20 DIAGNOSIS — Z515 Encounter for palliative care: Secondary | ICD-10-CM

## 2023-07-20 DIAGNOSIS — K921 Melena: Secondary | ICD-10-CM | POA: Diagnosis not present

## 2023-07-20 DIAGNOSIS — Z7189 Other specified counseling: Secondary | ICD-10-CM | POA: Diagnosis not present

## 2023-07-20 DIAGNOSIS — Z980 Intestinal bypass and anastomosis status: Secondary | ICD-10-CM

## 2023-07-20 HISTORY — PX: ESOPHAGOGASTRODUODENOSCOPY (EGD) WITH PROPOFOL: SHX5813

## 2023-07-20 HISTORY — PX: HEMOSTASIS CONTROL: SHX6838

## 2023-07-20 LAB — BASIC METABOLIC PANEL
Anion gap: 10 (ref 5–15)
BUN: 21 mg/dL (ref 8–23)
CO2: 27 mmol/L (ref 22–32)
Calcium: 8.3 mg/dL — ABNORMAL LOW (ref 8.9–10.3)
Chloride: 106 mmol/L (ref 98–111)
Creatinine, Ser: 1.34 mg/dL — ABNORMAL HIGH (ref 0.61–1.24)
GFR, Estimated: 57 mL/min — ABNORMAL LOW (ref >60)
Glucose, Bld: 93 mg/dL (ref 70–99)
Potassium: 4 mmol/L (ref 3.5–5.1)
Sodium: 143 mmol/L (ref 135–145)

## 2023-07-20 LAB — CBC WITH DIFFERENTIAL/PLATELET
Abs Immature Granulocytes: 0.16 10*3/uL — ABNORMAL HIGH (ref 0.00–0.07)
Basophils Absolute: 0.1 10*3/uL (ref 0.0–0.1)
Basophils Relative: 1 %
Eosinophils Absolute: 0.1 10*3/uL (ref 0.0–0.5)
Eosinophils Relative: 1 %
HCT: 31.1 % — ABNORMAL LOW (ref 39.0–52.0)
Hemoglobin: 9.9 g/dL — ABNORMAL LOW (ref 13.0–17.0)
Immature Granulocytes: 2 %
Lymphocytes Relative: 29 %
Lymphs Abs: 2.5 10*3/uL (ref 0.7–4.0)
MCH: 30.9 pg (ref 26.0–34.0)
MCHC: 31.8 g/dL (ref 30.0–36.0)
MCV: 97.2 fL (ref 80.0–100.0)
Monocytes Absolute: 0.7 10*3/uL (ref 0.1–1.0)
Monocytes Relative: 8 %
Neutro Abs: 5 10*3/uL (ref 1.7–7.7)
Neutrophils Relative %: 59 %
Platelets: 217 10*3/uL (ref 150–400)
RBC: 3.2 MIL/uL — ABNORMAL LOW (ref 4.22–5.81)
RDW: 17.8 % — ABNORMAL HIGH (ref 11.5–15.5)
WBC: 8.5 10*3/uL (ref 4.0–10.5)
nRBC: 1.9 % — ABNORMAL HIGH (ref 0.0–0.2)

## 2023-07-20 LAB — COMPREHENSIVE METABOLIC PANEL
ALT: 13 U/L (ref 0–44)
AST: 17 U/L (ref 15–41)
Albumin: 2.8 g/dL — ABNORMAL LOW (ref 3.5–5.0)
Alkaline Phosphatase: 52 U/L (ref 38–126)
Anion gap: 7 (ref 5–15)
BUN: 19 mg/dL (ref 8–23)
CO2: 25 mmol/L (ref 22–32)
Calcium: 8.2 mg/dL — ABNORMAL LOW (ref 8.9–10.3)
Chloride: 110 mmol/L (ref 98–111)
Creatinine, Ser: 1.41 mg/dL — ABNORMAL HIGH (ref 0.61–1.24)
GFR, Estimated: 53 mL/min — ABNORMAL LOW (ref >60)
Glucose, Bld: 92 mg/dL (ref 70–99)
Potassium: 3.7 mmol/L (ref 3.5–5.1)
Sodium: 142 mmol/L (ref 135–145)
Total Bilirubin: 1.1 mg/dL (ref <1.2)
Total Protein: 5.6 g/dL — ABNORMAL LOW (ref 6.5–8.1)

## 2023-07-20 LAB — GLUCOSE, CAPILLARY
Glucose-Capillary: 86 mg/dL (ref 70–99)
Glucose-Capillary: 85 mg/dL (ref 70–99)
Glucose-Capillary: 72 mg/dL (ref 70–99)
Glucose-Capillary: 92 mg/dL (ref 70–99)
Glucose-Capillary: 85 mg/dL (ref 70–99)
Glucose-Capillary: 109 mg/dL — ABNORMAL HIGH (ref 70–99)
Glucose-Capillary: 161 mg/dL — ABNORMAL HIGH (ref 70–99)

## 2023-07-20 LAB — BPAM RBC
Blood Product Expiration Date: 202501022359
Blood Product Expiration Date: 202501022359
ISSUE DATE / TIME: 202411252001
ISSUE DATE / TIME: 202411252323
ISSUE DATE / TIME: 202411252323
ISSUE DATE / TIME: 202501022359
Unit Type and Rh: 202412312359
Unit Type and Rh: 202501022359
Unit Type and Rh: 6200
Unit Type and Rh: 6200
Unit Type and Rh: 6200

## 2023-07-20 LAB — TYPE AND SCREEN
ABO/RH(D): A POS
Antibody Screen: NEGATIVE
Unit division: 0
Unit division: 0
Unit division: 0

## 2023-07-20 LAB — POCT I-STAT 7, (LYTES, BLD GAS, ICA,H+H)
Acid-base deficit: 4 mmol/L — ABNORMAL HIGH (ref 0.0–2.0)
Bicarbonate: 21.1 mmol/L (ref 20.0–28.0)
Calcium, Ion: 1.26 mmol/L (ref 1.15–1.40)
HCT: 26 % — ABNORMAL LOW (ref 39.0–52.0)
Hemoglobin: 8.8 g/dL — ABNORMAL LOW (ref 13.0–17.0)
O2 Saturation: 92 %
Patient temperature: 98.6
Potassium: 3.6 mmol/L (ref 3.5–5.1)
Sodium: 143 mmol/L (ref 135–145)
TCO2: 22 mmol/L (ref 22–32)
pCO2 arterial: 35.1 mm[Hg] (ref 32–48)
pH, Arterial: 7.386 (ref 7.35–7.45)
pO2, Arterial: 65 mm[Hg] — ABNORMAL LOW (ref 83–108)

## 2023-07-20 LAB — CBC
HCT: 26.2 % — ABNORMAL LOW (ref 39.0–52.0)
Hemoglobin: 8.1 g/dL — ABNORMAL LOW (ref 13.0–17.0)
MCH: 29.7 pg (ref 26.0–34.0)
MCHC: 30.9 g/dL (ref 30.0–36.0)
MCV: 96 fL (ref 80.0–100.0)
Platelets: 195 10*3/uL (ref 150–400)
RBC: 2.73 MIL/uL — ABNORMAL LOW (ref 4.22–5.81)
RDW: 17.5 % — ABNORMAL HIGH (ref 11.5–15.5)
WBC: 6.2 10*3/uL (ref 4.0–10.5)
nRBC: 0.6 % — ABNORMAL HIGH (ref 0.0–0.2)

## 2023-07-20 LAB — HEMOGLOBIN AND HEMATOCRIT, BLOOD
HCT: 25.1 % — ABNORMAL LOW (ref 39.0–52.0)
Hemoglobin: 7.9 g/dL — ABNORMAL LOW (ref 13.0–17.0)

## 2023-07-20 LAB — MRSA NEXT GEN BY PCR, NASAL: MRSA by PCR Next Gen: NOT DETECTED

## 2023-07-20 LAB — TROPONIN I (HIGH SENSITIVITY): Troponin I (High Sensitivity): 33 ng/L — ABNORMAL HIGH (ref <18)

## 2023-07-20 SURGERY — ESOPHAGOGASTRODUODENOSCOPY (EGD) WITH PROPOFOL
Anesthesia: General

## 2023-07-20 MED ORDER — FENTANYL CITRATE PF 50 MCG/ML IJ SOSY
25.0000 ug | PREFILLED_SYRINGE | INTRAMUSCULAR | Status: DC | PRN
  Administered 2023-07-21: 75 ug via INTRAVENOUS

## 2023-07-20 MED ORDER — EPINEPHRINE HCL 5 MG/250ML IV SOLN IN NS
0.5000 ug/min | INTRAVENOUS | Status: DC
  Filled 2023-07-20: qty 250

## 2023-07-20 MED ORDER — DOCUSATE SODIUM 50 MG/5ML PO LIQD
100.0000 mg | Freq: Two times a day (BID) | ORAL | Status: DC
Start: 2023-07-20 — End: 2023-07-24
  Administered 2023-07-22 – 2023-07-23 (×3): 100 mg
  Filled 2023-07-20 (×3): qty 10

## 2023-07-20 MED ORDER — DEXTROSE IN LACTATED RINGERS 5 % IV SOLN: INTRAVENOUS | Status: DC

## 2023-07-20 MED ORDER — FENTANYL CITRATE PF 50 MCG/ML IJ SOSY: 25.0000 ug | PREFILLED_SYRINGE | INTRAMUSCULAR | Status: DC | PRN

## 2023-07-20 MED ORDER — LACTATED RINGERS IV BOLUS
500.0000 mL | Freq: Once | INTRAVENOUS | Status: AC
  Administered 2023-07-20: 500 mL via INTRAVENOUS

## 2023-07-20 MED ORDER — ORAL CARE MOUTH RINSE
15.0000 mL | OROMUCOSAL | Status: DC
  Administered 2023-07-20 – 2023-07-21 (×11): 15 mL via OROMUCOSAL

## 2023-07-20 MED ORDER — FENTANYL CITRATE PF 50 MCG/ML IJ SOSY: 25.0000 ug | PREFILLED_SYRINGE | Freq: Once | INTRAMUSCULAR | Status: DC

## 2023-07-20 MED ORDER — METOPROLOL SUCCINATE ER 25 MG PO TB24: 25.0000 mg | ORAL_TABLET | Freq: Every day | ORAL | Status: DC

## 2023-07-20 MED ORDER — FENTANYL BOLUS VIA INFUSION
50.0000 ug | INTRAVENOUS | Status: DC | PRN
  Administered 2023-07-20 (×4): 50 ug via INTRAVENOUS

## 2023-07-20 MED ORDER — EPINEPHRINE PF 1 MG/ML IJ SOLN
INTRAMUSCULAR | Status: DC | PRN
  Administered 2023-07-20: .01 mg via INTRAVENOUS
  Administered 2023-07-20: 1 mg via INTRAVENOUS
  Administered 2023-07-20: .1 mg via INTRAVENOUS
  Administered 2023-07-20: .01 mg via INTRAVENOUS

## 2023-07-20 MED ORDER — MIDAZOLAM HCL 2 MG/2ML IJ SOLN
INTRAMUSCULAR | Status: AC
Start: 2023-07-20 — End: ?
  Filled 2023-07-20: qty 2

## 2023-07-20 MED ORDER — LACTATED RINGERS IV SOLN: INTRAVENOUS | Status: DC | PRN

## 2023-07-20 MED ORDER — ROCURONIUM BROMIDE 100 MG/10ML IV SOLN
INTRAVENOUS | Status: DC | PRN
  Administered 2023-07-20: 70 mg via INTRAVENOUS

## 2023-07-20 MED ORDER — LACTATED RINGERS IV BOLUS
500.0000 mL | INTRAVENOUS | Status: AC
  Administered 2023-07-20: 500 mL via INTRAVENOUS

## 2023-07-20 MED ORDER — LIDOCAINE HCL (CARDIAC) PF 100 MG/5ML IV SOSY
PREFILLED_SYRINGE | INTRAVENOUS | Status: DC | PRN
  Administered 2023-07-20: 80 mg via INTRAVENOUS

## 2023-07-20 MED ORDER — ORAL CARE MOUTH RINSE: 15.0000 mL | OROMUCOSAL | Status: DC | PRN

## 2023-07-20 MED ORDER — NOREPINEPHRINE 4 MG/250ML-% IV SOLN
0.0000 ug/min | INTRAVENOUS | Status: DC
  Administered 2023-07-21: 6 ug/min via INTRAVENOUS
  Filled 2023-07-20 (×3): qty 250

## 2023-07-20 MED ORDER — PROPOFOL 500 MG/50ML IV EMUL
INTRAVENOUS | Status: DC | PRN
  Administered 2023-07-20: 100 ug/kg/min via INTRAVENOUS

## 2023-07-20 MED ORDER — MIDAZOLAM HCL 5 MG/5ML IJ SOLN
INTRAMUSCULAR | Status: DC | PRN
  Administered 2023-07-20 (×2): 2 mg via INTRAVENOUS

## 2023-07-20 MED ORDER — DEXMEDETOMIDINE HCL IN NACL 400 MCG/100ML IV SOLN
0.0000 ug/kg/h | INTRAVENOUS | Status: DC
  Administered 2023-07-20: .1 ug/kg/h via INTRAVENOUS
  Filled 2023-07-20: qty 100

## 2023-07-20 MED ORDER — FENTANYL 2500MCG IN NS 250ML (10MCG/ML) PREMIX INFUSION
0.0000 ug/h | INTRAVENOUS | Status: DC
  Administered 2023-07-20: 25 ug/h via INTRAVENOUS
  Filled 2023-07-20: qty 250

## 2023-07-20 MED ORDER — VASOPRESSIN 20 UNITS/100 ML INFUSION FOR SHOCK
INTRAVENOUS | Status: AC
  Administered 2023-07-20: 0.03 [IU]/min via INTRAVENOUS
  Filled 2023-07-20: qty 100

## 2023-07-20 MED ORDER — NOREPINEPHRINE 4 MG/250ML-% IV SOLN
INTRAVENOUS | Status: AC
  Filled 2023-07-20: qty 250

## 2023-07-20 MED ORDER — IPRATROPIUM-ALBUTEROL 0.5-2.5 (3) MG/3ML IN SOLN
3.0000 mL | RESPIRATORY_TRACT | Status: DC | PRN
  Administered 2023-07-20: 3 mL via RESPIRATORY_TRACT
  Filled 2023-07-20: qty 3

## 2023-07-20 MED ORDER — EPINEPHRINE HCL 5 MG/250ML IV SOLN IN NS
INTRAVENOUS | Status: DC | PRN
  Administered 2023-07-20: 3 ug/min via INTRAVENOUS

## 2023-07-20 MED ORDER — POLYETHYLENE GLYCOL 3350 17 G PO PACK: 17.0000 g | PACK | Freq: Every day | ORAL | Status: DC

## 2023-07-20 MED ORDER — VASOPRESSIN 20 UNITS/100 ML INFUSION FOR SHOCK
0.0000 [IU]/min | INTRAVENOUS | Status: DC
  Administered 2023-07-21: 0.03 [IU]/min via INTRAVENOUS
  Filled 2023-07-20: qty 100

## 2023-07-20 MED ORDER — NOREPINEPHRINE 4 MG/250ML-% IV SOLN
INTRAVENOUS | Status: DC | PRN
  Administered 2023-07-20: 6 ug/min via INTRAVENOUS
  Administered 2023-07-20: 4 ug/min via INTRAVENOUS

## 2023-07-20 MED ORDER — CALCIUM CHLORIDE 10 % IV SOLN
INTRAVENOUS | Status: DC | PRN
  Administered 2023-07-20 (×2): .5 g via INTRAVENOUS

## 2023-07-20 MED ORDER — PHENYLEPHRINE 80 MCG/ML (10ML) SYRINGE FOR IV PUSH (FOR BLOOD PRESSURE SUPPORT)
PREFILLED_SYRINGE | INTRAVENOUS | Status: DC | PRN
  Administered 2023-07-20: 80 ug via INTRAVENOUS

## 2023-07-20 MED ORDER — CHLORHEXIDINE GLUCONATE CLOTH 2 % EX PADS
6.0000 | MEDICATED_PAD | Freq: Every day | CUTANEOUS | Status: DC
Start: 2023-07-20 — End: 2023-08-01
  Administered 2023-07-20 – 2023-07-31 (×12): 6 via TOPICAL

## 2023-07-20 MED ORDER — SODIUM CHLORIDE 0.9 % IV SOLN: INTRAVENOUS | Status: DC

## 2023-07-20 MED ORDER — PROPOFOL 10 MG/ML IV BOLUS
INTRAVENOUS | Status: DC | PRN
  Administered 2023-07-20: 50 mg via INTRAVENOUS
  Administered 2023-07-20: 40 mg via INTRAVENOUS

## 2023-07-20 MED ORDER — PHENYLEPHRINE HCL (PRESSORS) 10 MG/ML IV SOLN
INTRAVENOUS | Status: DC | PRN
  Administered 2023-07-20 (×2): 160 ug via INTRAVENOUS

## 2023-07-20 NOTE — Progress Notes (Signed)
eLink Physician-Brief Progress Note Patient Name: [name]. DOB: [birth year] MRN: [id number]   Date of Service  07/20/2023  HPI/Events of Note  Minimal urine output, CVP 6.  eICU Interventions  500 ml LR bolus ordered.        Migdalia Dk 07/20/2023, 11:42 PM

## 2023-07-20 NOTE — Anesthesia Procedure Notes (Signed)
Arterial Line Insertion Start/End11/26/2024 1:55 PM, 07/20/2023 4:00 PM Performed by: Marquis Buggy, CRNA, CRNA  Patient location: PACU. Emergency situation Patient sedated Right, radial was placed Catheter size: 20 G Hand hygiene performed   Attempts: 1 Procedure performed without using ultrasound guided technique. Following insertion, dressing applied and Biopatch. Post procedure assessment: normal  Patient tolerated the procedure well with no immediate complications.

## 2023-07-20 NOTE — Hospital Course (Signed)
72 y.o. male with medical history significant for Atria Flutter, autoimmune hemolytic anemia, CKD 3, diastolic CHF, hypertension, gastric mass, diabetes mellitus. Patient was sent to the ED reports of low hemoglobin.  Patient reports he has been having black stools since leaving the hospital.  No vomiting no blood in stools.  No abdominal pain.  He was sent to the ED with reports of hemoglobin of 5.4.   Recent hospitalization 11/7- 11/21 for gastric outlet obstruction due to gastric adenocarcinoma.  EGD 11/8 revealed a large circumferential fungating malignant gastric tumor at the pylorus and in the gastric antrum.  Patient had palliative gastrojejunostomy 11/13.     ED Course: Heart rate 80s.  Blood pressure systolic 114-122.  Hemoglobin 6.2. PRBC ordered for transfusion.  Delay in getting blood due to presence of antibodies. IV Protonix 80 mg given.  Kcentra given. GI consulted.  EDP talked to Dr. Robyne Peers, general surgeon, does not feel blood los is originating from the anastomosis.

## 2023-07-20 NOTE — Progress Notes (Signed)
PT Cancellation Note  Patient Details Name: [name]. MRN: [id number] DOB: 05-13-1951   Cancelled Treatment:    Reason Eval/Treat Not Completed: Patient at procedure or test/unavailable   Nelida Meuse 07/20/2023, 1:04 PM

## 2023-07-20 NOTE — Progress Notes (Signed)
[facility] Progress Note     Subjective: Patient seen and examined.  He was admitted to the hospital yesterday for a significant drop in his hemoglobin with melanotic stools noted at his facility.  Patient himself denies ever having any abdominal pain or issues related to the surgery.  He said he has had some bowel movements, but was unsure if they were bloody.  He is tolerating his full liquid diet at his facility without any significant issues.  In the ED, he was noted to have a hemoglobin of 6.2.  He received the reversal agent for Eliquis while in the emergency department.  His gastric cancer is thought to be the source of his bleeding.  He received 3 units of PRBCs and hemoglobin is 8.1 this morning.  He has normal vital signs and is not tachycardic.  Objective: Vital signs in last 24 hours: Temp:  [98 F (36.7 C)-99.4 F (37.4 C)] 98 F (36.7 C) (11/26 0713) Pulse Rate:  [77-88] 85 (11/26 0200) Resp:  [14-35] 15 (11/26 0200) BP: (101-127)/(51-71) 123/65 (11/26 0713) SpO2:  [87 %-100 %] 100 % (11/26 0200) Weight:  [90.5 kg] 90.5 kg (11/25 1330) Last BM Date : 07/19/23  Intake/Output from previous day: 11/25 0701 - 11/26 0700 In: 962.5 [I.V.:250; Blood:622.5; IV Piggyback:90] Out: -  Intake/Output this shift: No intake/output data recorded.  General appearance: alert, cooperative, and no distress GI: Abdomen soft, nondistended, no percussion tenderness, nontender to palpation; no rigidity, guarding, or rebound tenderness; midline incision healing with skin staples in place  Lab Results:  Recent Labs    07/19/23 1338 07/20/23 0214 07/20/23 0414  WBC 6.4  --  6.2  HGB 6.2* 7.9* 8.1*  HCT 19.9* 25.1* 26.2*  PLT 234  --  195   BMET Recent Labs    07/19/23 1338 07/20/23 0414  NA 142 143  K 3.7 4.0  CL 106 106  CO2 26 27  GLUCOSE 109* 93  BUN 20 21  CREATININE 1.12 1.34*  CALCIUM 8.5* 8.3*   PT/INR No results for input(s): "LABPROT", "INR" in  the last 72 hours.  Studies/Results: No results found.  Anti-infectives: Anti-infectives (From admission, onward)    None       Assessment/Plan:  Patient is a 72 year old male who was admitted with concern for bleeding gastric cancer.  He is status post palliative gastrojejunostomy on 11/13.  -I discussed the case with GI, and they are okay to proceed with EGD.  Would recommend trying not to over distend the stomach during procedure and to use CO2 to insufflate the stomach. -I discussed with the patient that proceeding with EGD should not cause significant issue from his recent surgery, however there is a small chance that some gas can leak out from the anastomotic site. -I suspect his gastric cancer is bleeding as he has been continuing to take his Eliquis.  Would recommend holding Eliquis, and patient will likely not be able to be on blood thinners in the future -Once patient is done with EGD, he is okay for full liquid diet from a surgical standpoint -If he remains in the hospital for another couple of days, I will plan to remove his skin staples.  Otherwise, I will plan to remove them at his follow-up visit with me on 12/3 -Care per GI and hospitalist   LOS: 1 day    Lavere Stork A Earnie Bechard 07/20/2023

## 2023-07-20 NOTE — H&P (Incomplete)
NAME:  Frank Hold., MRN:  [id number], DOB:  1950/08/26, LOS: 1 ADMISSION DATE:  07/19/2023, CONSULTATION DATE:  07/20/2023 REFERRING MD:  hospitalist [facility], CHIEF COMPLAINT:  cardiac arrest   History of Present Illness:  72 year old male with past medical history of CKD III, diastolic CHF, hypertension, hyperlipidemia, atrial flutter on Eliquis, diabetes, autoimmune hemolytic anemia, prostate cancer s/p radiation, DLBCL s/p stem cell transplant 2007, newer diagnosis of gastric mass who presented to ED on 07/19/23 with anemia. In ED hemoglobin 6.2, FOB positive. He received Eliquis reversal with Andexxa, 3UPRBC, protonix, GI consult and admitted to hospitalist. GI recommended proceeding with EGD which was performed on day on consultation 11/26. Unfortunately, during procedure, patient had cardiac arrest ~ 2 minutes with ROSC achieved. He was intubated and PCCM was consulted for transfer to [facility].   Regarding gastric mass. New diagnosis of gastric adenocarcinoma. He was admitted to hospital on 07/01/2023 for constipation, early satiety, bloating. Had CT showing circumferential pass. 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 Surgery was consulted and performed gastrojejunostomy 11/13 and he was discharged from hospital on 11/21 with scheduled follow up to Dr. Ellin Saba for initiation of chemotherapy. Additionally, while Eliquis held during procedures he was found to have superficial vein thrombus in cephalic vein. Eliquis was restarted on discharge.  Pertinent  Medical History  CKD III, diastolic CHF, hypertension, hyperlipidemia, atrial flutter on Eliquis, diabetes, autoimmune hemolytic anemia, prostate cancer s/p radiation, DLBCL s/p stem cell transplant 2007, newer diagnosis of invasive adenocarcinoma  Significant Hospital Events: Including procedures, antibiotic start and stop dates in addition to  other pertinent events   11/25: admitted for GI bleed from gastric adenocarcinoma. 3 U PRBC. GI consult  11/26: upper endoscopy with intra-procedure cardiac arrest ~ 2 minutes with ROSC. Intubated. Transferred to [facility] PCCM   Interim History / Subjective:  ***  Objective   Blood pressure 133/60, pulse 84, temperature 98.4 F (36.9 C), resp. rate 17, height 5\' 11"  (1.803 m), weight 90.5 kg, SpO2 100%.        Intake/Output Summary (Last 24 hours) at 07/20/2023 1510 Last data filed at 07/20/2023 0135 Gross per 24 hour  Intake 962.5 ml  Output --  Net 962.5 ml   Filed Weights   07/19/23 1330  Weight: 90.5 kg    Examination: General: *** HENT: *** Lungs: *** Cardiovascular: *** Abdomen: *** Extremities: *** Neuro: *** GU: ***  Resolved Hospital Problem list     Assessment & Plan:  Cardiac Arrest   Acute respiratory failure with hypoxia   Invasive gastric adenocarcinoma  GI bleed   Hypertension Hyperlipidemia   Atrial flutter anticoagulated on Eliquis   Diabetes   CKD III   Diastolic CHF    Best Practice (right click and "Reselect all SmartList Selections" daily)   Diet/type: {diet type:25684} DVT prophylaxis: {anticoagulation (Optional):25687} Pressure ulcer(s). {PRESSURE ULCERS:21468::"pressure injury stage *** WAS present on admission","pressure injury stage *** was NOT present on admission","pressure ulcer stage *** WAS present on admission","pressure ulcer stage *** was NOT present on admission","Clinically undetermined if the pressure injury was present on admission"} GI prophylaxis: {BM:84132} Lines: {Central Venous Access:25771} Foley:  {Central Venous Access:25691} Code Status:  {Code Status:26939} Last date of multidisciplinary goals of care discussion [***]  Labs   CBC: Recent Labs  Lab 07/19/23 1338 07/20/23 0214 07/20/23 0414  WBC 6.4  --  6.2  NEUTROABS 5.2  --   --  HGB 6.2* 7.9* 8.1*  HCT 19.9* 25.1* 26.2*  MCV 101.5*  --  96.0   PLT 234  --  195    Basic Metabolic Panel: Recent Labs  Lab 07/14/23 0407 07/19/23 1338 07/20/23 0414  NA 139 142 143  K 3.5 3.7 4.0  CL 104 106 106  CO2 25 26 27   GLUCOSE 97 109* 93  BUN 14 20 21   CREATININE 1.23 1.12 1.34*  CALCIUM 8.4* 8.5* 8.3*  MG 2.1  --   --    GFR: Estimated Creatinine Clearance: 58.2 mL/min (A) (by C-G formula based on SCr of 1.34 mg/dL (H)). Recent Labs  Lab 07/19/23 1338 07/20/23 0414  WBC 6.4 6.2    Liver Function Tests: No results for input(s): "AST", "ALT", "ALKPHOS", "BILITOT", "PROT", "ALBUMIN" in the last 168 hours. No results for input(s): "LIPASE", "AMYLASE" in the last 168 hours. No results for input(s): "AMMONIA" in the last 168 hours.  ABG    Component Value Date/Time   PHART 7.458 (H) 05/18/2017 1935   PCO2ART 31.5 (L) 05/18/2017 1935   PO2ART 68.6 (L) 05/18/2017 1935   HCO3 23.9 05/18/2017 1935   ACIDBASEDEF 1.3 05/18/2017 1935   O2SAT 94.0 05/18/2017 1935     Coagulation Profile: No results for input(s): "INR", "PROTIME" in the last 168 hours.  Cardiac Enzymes: No results for input(s): "CKTOTAL", "CKMB", "CKMBINDEX", "TROPONINI" in the last 168 hours.  HbA1C: Hemoglobin A1C  Date/Time Value Ref Range Status  06/16/2023 10:48 AM 4.6 4.0 - 5.6 % Final  02/10/2023 09:44 AM 5.5 4.0 - 5.6 % Final   HbA1c, POC (controlled diabetic range)  Date/Time Value Ref Range Status  02/09/2022 09:45 AM 5.3 0.0 - 7.0 % Final  08/11/2021 09:25 AM 5.1 0.0 - 7.0 % Final    CBG: Recent Labs  Lab 07/19/23 2023 07/19/23 2059 07/20/23 0213 07/20/23 0716 07/20/23 1252  GLUCAP 68* 82 86 85 72    Review of Systems:   As above  Past Medical History:  He,  has a past medical history of Asthma, Diabetes mellitus without complication (HCC), DLBCL (diffuse large B cell lymphoma) (HCC) (02/28/2009), Edema, Heart failure, diastolic, chronic (HCC), Hyperlipidemia, mixed, Hypertension, IDA (iron deficiency anemia), Large cell lymphoma  (HCC) (01/2005), Obesity, morbid (more than 100 lbs over ideal weight or BMI > 40) (HCC), and Venous insufficiency (04/29/2011).   Surgical History:   Past Surgical History:  Procedure Laterality Date   BACK SURGERY  2006   T6 vertebrae removed/titanium placed   BIOPSY  07/02/2023   Procedure: BIOPSY;  Surgeon: Franky Macho, MD;  Location: [facility];  Service: Endoscopy;;   CATARACT EXTRACTION W/PHACO Right 03/31/2019   Procedure: CATARACT EXTRACTION PHACO AND INTRAOCULAR LENS PLACEMENT (IOC);  Surgeon: Fabio Pierce, MD;  Location: [facility] ORS;  Service: Ophthalmology;  Laterality: Right;  CDE: 3.21   CATARACT EXTRACTION W/PHACO Left 04/14/2019   Procedure: CATARACT EXTRACTION PHACO AND INTRAOCULAR LENS PLACEMENT (IOC);  Surgeon: Fabio Pierce, MD;  Location: [facility];  Service: Ophthalmology;  Laterality: Left;  left - pt knows to arrive at 7:45, CDE: 3.55   COLONOSCOPY  11/24/2004   Polyps in the left colon ablated/removed as described above.  Two submucosal lesions consistent with lipomas as described above not  manipulated./ Normal rectum   COLONOSCOPY  06/20/2012   MULTIPLE RECTAL AND COLONIC POLYPS   COLONOSCOPY N/A 03/08/2015   RMR: Capacious, redundant colon. Multiple colonic and rectosigmoid polyps removed. ablated as described above. colonic lipoma abnormal appearing  terminal ileum likely a variant of normal). Howeverwith history  biopsies obtained.    ESOPHAGOGASTRODUODENOSCOPY N/A 03/08/2015   RMR: Hiatal hernia Polypoid gastric mucosa with multiple gastric polyps. largest polyp removed via snare polypectomgy hemostasis clip placed at base. Status post gastric biopsy. Status post video capsule placement.    ESOPHAGOGASTRODUODENOSCOPY (EGD) WITH PROPOFOL N/A 07/02/2023   Procedure: ESOPHAGOGASTRODUODENOSCOPY (EGD) WITH PROPOFOL;  Surgeon: Franky Macho, MD;  [facility];  Service: Endoscopy;  Laterality: N/A;   GASTROJEJUNOSTOMY N/A 07/07/2023   Procedure:  GASTROJEJUNOSTOMY;  Surgeon: Lewie Chamber, DO;  Location: [facility];  Service: General;  Laterality: N/A;   GIVENS CAPSULE STUDY N/A 03/08/2015   Procedure: GIVENS CAPSULE STUDY;  Surgeon: Corbin Ade, MD;  Location: [facility];  Service: Endoscopy;  Laterality: N/A;   GOLD SEED IMPLANT N/A 09/08/2022   Procedure: GOLD SEED IMPLANT;  Surgeon: Malen Gauze, MD;  Location: [facility];  Service: Urology;  Laterality: N/A;   LIMBAL STEM CELL TRANSPLANT     LUNG BIOPSY  6/06   MULTIPLE TOOTH EXTRACTIONS  04/2005   port a cath placement     PORT-A-CATH REMOVAL  09/08/2012   Procedure: REMOVAL PORT-A-CATH;  Surgeon: Loreli Slot, MD;  Location: [facility] OR;  Service: Thoracic;  Laterality: N/A;   SPACE OAR INSTILLATION N/A 09/08/2022   Procedure: SPACE OAR INSTILLATION;  Surgeon: Malen Gauze, MD;  Location: [facility];  Service: Urology;  Laterality: N/A;     Social History:   reports that he quit smoking about 20 years ago. His smoking use included cigarettes. He started smoking about 35 years ago. He has a 7.5 pack-year smoking history. He has never used smokeless tobacco. He reports that he does not drink alcohol and does not use drugs.   Family History:  His family history includes Cancer in his father and mother; Hyperlipidemia in his mother; Hypertension in his father and mother. There is no history of Colon cancer or Diabetes.   Allergies Allergies  Allergen Reactions   Penicillins Rash     Home Medications  Prior to Admission medications   Medication Sig Start Date End Date Taking? Authorizing Provider  apixaban (ELIQUIS) 5 MG TABS tablet Take 1 tablet (5 mg total) by mouth 2 (two) times daily. 01/24/18  Yes Laqueta Linden, MD  bisacodyl (DULCOLAX) 10 MG suppository Place 1 suppository (10 mg total) rectally daily as needed for moderate constipation. 07/15/23  Yes Vassie Loll, MD  feeding supplement (ENSURE ENLIVE / ENSURE PLUS) LIQD Take 237 mLs by mouth 3  (three) times daily between meals. Patient taking differently: Take 237 mLs by mouth 3 (three) times daily between meals. Recorded as "House Supplement" on [facility] 07/15/23  Yes Vassie Loll, MD  furosemide (LASIX) 20 MG tablet Take 1 tablet (20 mg total) by mouth daily as needed for fluid or edema. 07/30/22  Yes Strader, [place] M, PA-C  metoprolol succinate (TOPROL XL) 25 MG 24 hr tablet Take 1 tablet (25 mg total) by mouth daily. Take with or immediately following a meal. 07/15/23 07/14/24 Yes Vassie Loll, MD  oxyCODONE (OXY IR/ROXICODONE) 5 MG immediate release tablet Take 1 tablet (5 mg total) by mouth every 6 (six) hours as needed for severe pain (pain score 7-10). 07/15/23  Yes Vassie Loll, MD  polyethylene glycol (MIRALAX / GLYCOLAX) 17 g packet Take 17 g by mouth daily. 07/16/23  Yes Vassie Loll, MD  potassium chloride (KLOR-CON) 10 MEQ tablet Take 10 meq on  days you need to take Lasix Patient taking differently: Take 10 mEq by mouth daily as needed (when taking Lasix). Take 10 meq on days you need to take Lasix 08/07/22  Yes Strader, [place] M, PA-C  prochlorperazine (COMPAZINE) 10 MG tablet Take 1 tablet (10 mg total) by mouth every 6 (six) hours as needed for nausea or vomiting. 07/15/23  Yes Vassie Loll, MD  rosuvastatin (CRESTOR) 40 MG tablet Take 40 mg by mouth daily. 05/04/19  Yes [provider]  simethicone (MYLICON) 80 MG chewable tablet Chew 1 tablet (80 mg total) by mouth every 6 (six) hours as needed for flatulence (obstipation). 07/15/23  Yes Vassie Loll, MD  torsemide (DEMADEX) 20 MG tablet Take 20 mg by mouth daily. 07/17/23  Yes [provider]  Vitamin D, Ergocalciferol, (DRISDOL) 1.25 MG (50000 UNIT) CAPS capsule TAKE 1 CAPSULE BY MOUTH ONCE WEEKLY 08/18/22  Yes Doreatha Massed, MD  Blood Glucose Monitoring Suppl (ONE TOUCH ULTRA 2) w/Device KIT USE TO TEST BLOOD SUGAR ONCE D UTD 05/25/19   [provider]  Lancets (ONETOUCH DELICA  PLUS LANCET33G) MISC USE TO TEST TWICE DAILY 11/24/22   Dani Gobble, NP  [facility] ULTRA test strip TEST TWICE DAILY 11/22/20   Roma Kayser, MD     Critical care time: ***

## 2023-07-20 NOTE — H&P (View-Only) (Signed)
Subjective: Reports he is felling fairly well this morning. No nausea, vomiting, abdominal pain. Doesn't remember having any melena recently, but states he did have some a couple days ago. Per nurse, patient had 1 dark stool overnight.   Last dose of Eliquis morning of 11/25 Received Eliquis reversal yesterday.   Objective: Vital signs in last 24 hours: Temp:  [98 F (36.7 C)-99.4 F (37.4 C)] 98 F (36.7 C) (11/26 0713) Pulse Rate:  [77-88] 85 (11/26 0200) Resp:  [14-35] 15 (11/26 0200) BP: (101-127)/(51-71) 123/65 (11/26 0713) SpO2:  [87 %-100 %] 100 % (11/26 0200) Weight:  [90.5 kg] 90.5 kg (11/25 1330) Last BM Date : 07/19/23 General: Alert and oriented, pleasant, NAD.  Head:  Normocephalic and atraumatic. Eyes:  No icterus, sclera clear. Conjuctiva pink.  Abdomen:  Bowel sounds present, soft, non-tender, non-distended. Vertical incision site in mid upper abdomen with staples in place. No masses appreciated.  Msk:  Symmetrical without gross deformities. Normal posture. Extremities:  Without  edema. Neurologic:  Alert and  oriented x4, but seems to have slight confusion about current timeline of rehab vs hospital admission;  grossly normal neurologically. Psych:  Normal mood and affect.  Intake/Output from previous day: 11/25 0701 - 11/26 0700 In: 962.5 [I.V.:250; Blood:622.5; IV Piggyback:90] Out: -  Intake/Output this shift: No intake/output data recorded.  Lab Results: Recent Labs    07/19/23 1338 07/20/23 0214 07/20/23 0414  WBC 6.4  --  6.2  HGB 6.2* 7.9* 8.1*  HCT 19.9* 25.1* 26.2*  PLT 234  --  195   BMET Recent Labs    07/19/23 1338 07/20/23 0414  NA 142 143  K 3.7 4.0  CL 106 106  CO2 26 27  GLUCOSE 109* 93  BUN 20 21  CREATININE 1.12 1.34*  CALCIUM 8.5* 8.3*     Assessment: [name]. is a 72 y.o. year old male  withhistory of HTN, aflutter on Eliquis, CHF, DM, diffuse large B-cell lymphoma s/p stem cell transplant in 2007, IDA,  prostate cancer s/p radiation, recent diagnosis of gastric cancer with gastric outlet obstruction in early Nov 2024 s/p palliative gastrojejunostomy on 07/07/23 to allow patient to continue to eat while receiving treatment, developed occlusive thrombus in left cephalic vein on Jul 10, 2023 while inpatient, admitted 11/25 from [facility] due to acute on chronic anemia and melena in setting of Eliquis, last dose morning of 11/25.    Hgb 5 as outpatient and 6.2 on presentation to ED. Received reversal agent for Eliquis yesterday as well as 2 units PRBCs with Hgb improved to 8.1 today. Nurse reports 1 dark stool overnight.   Suspect acute blood loss anemia due to known gastric mass in setting of ongoing anticoagulation. Although s/p gastrojejunostomy, the mass is still accessible as only the pylorus was bypassed and no division of stomach. Limited options available endoscopically as we will only be able to provide short term management with hemospray. He has yet to see oncology outpatient (scheduled 12/12) and previously reported wanting to undergo treatment if able. I discussed this with him again today and he states that he wants to have treatment if it is possible as he feels like he has more life left to live. Palliative has been consulted this admission, but hasn't seen him yet. Regardless,  I wouldn't expect transition to comfort as he has yet to have the discussion with oncology regarding management options. Thus as this time, he is interested in having EGD for  bleeding control knowing that he is likely to bleed again.   Called to discuss with patient's wife as well. She is in agreement to proceed with EGD as well.    Plan: Proceed with upper endoscopy with propofol by Dr. [name]. The risks, benefits, and alternatives have been discussed with the patient and his wife in detail. The all parties state understanding and desires to proceed.  Continue IV pantoprazole 40 mg twice daily. Continue  monitor for overt GI bleeding. Monitor H&H and transfuse for hemoglobin less than 7. Further recommendations pending EGD.    LOS: 1 day    07/20/2023, 9:25 AM   Ermalinda Memos, PA-C [facility]

## 2023-07-20 NOTE — Progress Notes (Addendum)
Subjective: Reports he is felling fairly well this morning. No nausea, vomiting, abdominal pain. Doesn't remember having any melena recently, but states he did have some a couple days ago. Per nurse, patient had 1 dark stool overnight.   Last dose of Eliquis morning of 11/25 Received Eliquis reversal yesterday.   Objective: Vital signs in last 24 hours: Temp:  [98 F (36.7 C)-99.4 F (37.4 C)] 98 F (36.7 C) (11/26 0713) Pulse Rate:  [77-88] 85 (11/26 0200) Resp:  [14-35] 15 (11/26 0200) BP: (101-127)/(51-71) 123/65 (11/26 0713) SpO2:  [87 %-100 %] 100 % (11/26 0200) Weight:  [90.5 kg] 90.5 kg (11/25 1330) Last BM Date : 07/19/23 General: Alert and oriented, pleasant, NAD.  Head:  Normocephalic and atraumatic. Eyes:  No icterus, sclera clear. Conjuctiva pink.  Abdomen:  Bowel sounds present, soft, non-tender, non-distended. Vertical incision site in mid upper abdomen with staples in place. No masses appreciated.  Msk:  Symmetrical without gross deformities. Normal posture. Extremities:  Without  edema. Neurologic:  Alert and  oriented x4, but seems to have slight confusion about current timeline of rehab vs hospital admission;  grossly normal neurologically. Psych:  Normal mood and affect.  Intake/Output from previous day: 11/25 0701 - 11/26 0700 In: 962.5 [I.V.:250; Blood:622.5; IV Piggyback:90] Out: -  Intake/Output this shift: No intake/output data recorded.  Lab Results: Recent Labs    07/19/23 1338 07/20/23 0214 07/20/23 0414  WBC 6.4  --  6.2  HGB 6.2* 7.9* 8.1*  HCT 19.9* 25.1* 26.2*  PLT 234  --  195   BMET Recent Labs    07/19/23 1338 07/20/23 0414  NA 142 143  K 3.7 4.0  CL 106 106  CO2 26 27  GLUCOSE 109* 93  BUN 20 21  CREATININE 1.12 1.34*  CALCIUM 8.5* 8.3*     Assessment: [name]. is a 72 y.o. year old male  withhistory of HTN, aflutter on Eliquis, CHF, DM, diffuse large B-cell lymphoma s/p stem cell transplant in 2007, IDA,  prostate cancer s/p radiation, recent diagnosis of gastric cancer with gastric outlet obstruction in early Nov 2024 s/p palliative gastrojejunostomy on 07/07/23 to allow patient to continue to eat while receiving treatment, developed occlusive thrombus in left cephalic vein on Jul 10, 2023 while inpatient, admitted 11/25 from [facility] due to acute on chronic anemia and melena in setting of Eliquis, last dose morning of 11/25.    Hgb 5 as outpatient and 6.2 on presentation to ED. Received reversal agent for Eliquis yesterday as well as 2 units PRBCs with Hgb improved to 8.1 today. Nurse reports 1 dark stool overnight.   Suspect acute blood loss anemia due to known gastric mass in setting of ongoing anticoagulation. Although s/p gastrojejunostomy, the mass is still accessible as only the pylorus was bypassed and no division of stomach. Limited options available endoscopically as we will only be able to provide short term management with hemospray. He has yet to see oncology outpatient (scheduled 12/12) and previously reported wanting to undergo treatment if able. I discussed this with him again today and he states that he wants to have treatment if it is possible as he feels like he has more life left to live. Palliative has been consulted this admission, but hasn't seen him yet. Regardless,  I wouldn't expect transition to comfort as he has yet to have the discussion with oncology regarding management options. Thus as this time, he is interested in having EGD for  bleeding control knowing that he is likely to bleed again.   Called to discuss with patient's wife as well. She is in agreement to proceed with EGD as well.    Plan: Proceed with upper endoscopy with propofol by Dr. [name]. The risks, benefits, and alternatives have been discussed with the patient and his wife in detail. The all parties state understanding and desires to proceed.  Continue IV pantoprazole 40 mg twice daily. Continue  monitor for overt GI bleeding. Monitor H&H and transfuse for hemoglobin less than 7. Further recommendations pending EGD.    LOS: 1 day    07/20/2023, 9:25 AM   Ermalinda Memos, PA-C [facility]

## 2023-07-20 NOTE — Progress Notes (Signed)
PROGRESS NOTE   [name].  WGN:[id number] DOB: 20-Sep-1950 DOA: 07/19/2023 PCP: [name], MD   Chief Complaint  Patient presents with   Low Hemoglobin   Level of care: Telemetry  Brief Admission History:  72 y.o. male with medical history significant for Atria Flutter, autoimmune hemolytic anemia, CKD 3, diastolic CHF, hypertension, gastric mass, diabetes mellitus. Patient was sent to the ED reports of low hemoglobin.  Patient reports he has been having black stools since leaving the hospital.  No vomiting no blood in stools.  No abdominal pain.  He was sent to the ED with reports of hemoglobin of 5.4.   Recent hospitalization 11/7- 11/21 for gastric outlet obstruction due to gastric adenocarcinoma.  EGD 11/8 revealed a large circumferential fungating malignant gastric tumor at the pylorus and in the gastric antrum.  Patient had palliative gastrojejunostomy 11/13.     ED Course: Heart rate 80s.  Blood pressure systolic 114-122.  Hemoglobin 6.2. PRBC ordered for transfusion.  Delay in getting blood due to presence of antibodies. IV Protonix 80 mg given.  Kcentra given. GI consulted.  EDP talked to Dr. Robyne Peers, general surgeon, does not feel blood los is originating from the anastomosis.   Assessment and Plan:  Upper GI bleed Presented with melena over the past few days, hemoglobin down to 6.4, from baseline 9-10.  On anticoagulation with apixaban.  No hematochezia or hematemesis.   Delay in getting blood due to presence of antibodies. Theodoro Parma and ANDEXXA given in ED -Transfused 2 units PRBCs -IVF x 12 hrs completed  -IV Protonix 80 x 1 followed by 40 twice daily -N.p.o. for EGD followed by full liquids  Superficial vein thrombosis Currently with swelling to left upper extremity compared to right, recent venous Doppler 11/16 shows occlusive thrombus within the cephalic vein at the level of the antecubital fossa, no evidence of DVT.  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 -Dr. Ellin Saba consulted, patient wants to pursue chemotherapy -Gastrojejunostomy 11/13 by Dr. Robyne Peers, UGI study 11/18---Patent gastrojejunostomy anastomosis without evidence of stricture or contrast extravasation. -Dr. Robyne Peers came to see him 11/26; can have full liquid diet after EGD -skin staples will be removed by Dr. Robyne Peers if he is in hospital for a couple of days otherwise she will remove in office on 12/3 follow up appt.   -GI requested a palliative consult which is impending  Atrial flutter Rate controlled and on anticoagulation with apixaban. -Hold apixaban for now pending GI workup -resume metoprolol with hold parameters   Type 2 diabetes mellitus with complication, without long-term current use of insulin  A1c 4.6.  Diet-Controlled.  Not on medication.  Essential hypertension, benign Stable. -metoprolol resumed with hold parameters entered  DIASTOLIC HEART FAILURE, CHRONIC Stable and compensated.  Echo 07/2022-EF of 55 to 60%, grade 2 DD. -Resume torsemide 20 mg daily in a.m.  DVT prophylaxis: SCDs hold all chemical prophylaxis  Code Status: Full  Family Communication:  Disposition: anticipate return to [facility]    Consultants:  GI Palliative  Procedures:  EGD planned for 11/26:   Antimicrobials:    Subjective: Pt says he is feeling much less weak after blood transfusion completed.  He denies pain or discomfort at this time.  Objective: Vitals:   07/20/23 0135 07/20/23 0200 07/20/23 0424 07/20/23 0713  BP: 116/71 (!) 107/56 (!) 127/53 123/65  Pulse: 84 85    Resp: (!) 22 15    Temp: 99.1 F (37.3 C)   99.4 F (37.4 C) 98 F (36.7 C)  TempSrc: Oral  Axillary   SpO2: 98% 100%    Weight:      Height:        Intake/Output Summary (Last 24 hours) at 07/20/2023 1134 Last data filed at 07/20/2023 0135 Gross per 24 hour  Intake 962.5 ml  Output --  Net 962.5 ml   Filed Weights   07/19/23 1330  Weight: 90.5 kg   Examination:  General exam: Appears calm and comfortable  Respiratory system: Clear to auscultation. Respiratory effort normal. Cardiovascular system: normal S1 & S2 heard. No JVD, murmurs, rubs, gallops or clicks. No pedal edema. Gastrointestinal system: Abdomen is nondistended, soft and nontender. No organomegaly or masses felt. Normal bowel sounds heard. Central nervous system: Alert and oriented. No focal neurological deficits. Extremities: Symmetric 5 x 5 power. Skin: No rashes, lesions or ulcers. Psychiatry: Judgement and insight appear normal. Mood & affect appropriate.   Data Reviewed: I have personally reviewed following labs and imaging studies  CBC: Recent Labs  Lab 07/19/23 1338 07/20/23 0214 07/20/23 0414  WBC 6.4  --  6.2  NEUTROABS 5.2  --   --   HGB 6.2* 7.9* 8.1*  HCT 19.9* 25.1* 26.2*  MCV 101.5*  --  96.0  PLT 234  --  195    Basic Metabolic Panel: Recent Labs  Lab 07/14/23 0407 07/19/23 1338 07/20/23 0414  NA 139 142 143  K 3.5 3.7 4.0  CL 104 106 106  CO2 25 26 27   GLUCOSE 97 109* 93  BUN 14 20 21   CREATININE 1.23 1.12 1.34*  CALCIUM 8.4* 8.5* 8.3*  MG 2.1  --   --     CBG: Recent Labs  Lab 07/19/23 1959 07/19/23 2023 07/19/23 2059 07/20/23 0213 07/20/23 0716  GLUCAP 73 68* 82 86 85    No results found for this or any previous visit (from the past 240 hour(s)).   Radiology Studies: No results found.  Scheduled Meds:  sodium chloride   Intravenous Once   pantoprazole (PROTONIX) IV  40 mg Intravenous Q12H   torsemide  20 mg Oral Daily   Continuous Infusions:   LOS: 1 day   Time spent: 51 mins  Audery Wassenaar  Laural Benes, MD How to contact the [facility] Attending or Consulting provider 7A - 7P or covering provider during after hours 7P -7A, for this patient?  Check the care team in [facility] and look for a) attending/consulting [facility] provider listed and b) the [facility] team listed Log into [email or web address] to find provider on call.  Locate the [facility] provider you are looking for under [facility] and page to a number that you can be directly reached. If you still have difficulty reaching the provider, please page the [facility] (Director on Call) for the Hospitalists listed on amion for assistance.  07/20/2023, 11:34 AM

## 2023-07-20 NOTE — Anesthesia Preprocedure Evaluation (Addendum)
Anesthesia Evaluation    Airway Mallampati: II  TM Distance: >3 FB Neck ROM: Full    Dental  (+) Dental Advisory Given, Missing, Edentulous Upper, Poor Dentition All but 2 teeth missing lower and those look terrible and are in the front:   Pulmonary asthma , pneumonia, former smoker   Pulmonary exam normal breath sounds clear to auscultation       Cardiovascular hypertension, +CHF  Normal cardiovascular exam+ dysrhythmias Atrial Fibrillation  Rhythm:Regular Rate:Normal  Diastolic heart failure ( grade 2 dysfunction ).  EF OK   Neuro/Psych negative neurological ROS  negative psych ROS   GI/Hepatic negative GI ROS, Neg liver ROS,,,  Endo/Other  diabetes, Type 2    Renal/GU      Musculoskeletal   Abdominal Normal abdominal exam  (+)   Peds  Hematology  (+) Blood dyscrasia, anemia Hgb 10   Anesthesia Other Findings Lymphoma  Reproductive/Obstetrics                             Anesthesia Physical Anesthesia Plan  ASA: 3  Anesthesia Plan: General   Post-op Pain Management:    Induction: Intravenous  PONV Risk Score and Plan: Propofol infusion  Airway Management Planned: Nasal Cannula and Natural Airway  Additional Equipment: None  Intra-op Plan:   Post-operative Plan:   Informed Consent: I have reviewed the patients History and Physical, chart, labs and discussed the procedure including the risks, benefits and alternatives for the proposed anesthesia with the patient or authorized representative who has indicated his/her understanding and acceptance.     Dental advisory given  Plan Discussed with: CRNA  Anesthesia Plan Comments:        Anesthesia Quick Evaluation

## 2023-07-20 NOTE — Progress Notes (Signed)
Initial Nutrition Assessment  DOCUMENTATION CODES:   Non-severe (moderate) malnutrition in context of acute illness/injury  INTERVENTION:   When diet advanced, add: Chocolate Ensure Enlive po TID, each supplement provides 350 kcal and 20 grams of protein. Magic cup TID with meals, each supplement provides 290 kcal and 9 grams of protein. MVI with minerals daily.  NUTRITION DIAGNOSIS:   Moderate Malnutrition related to acute illness (recently diagnosed gastric cancer) as evidenced by mild muscle depletion, mild fat depletion.  GOAL:   Patient will meet greater than or equal to 90% of their needs  MONITOR:   Diet advancement  REASON FOR ASSESSMENT:   Malnutrition Screening Tool    ASSESSMENT:   72 yo male admitted with GI bleed. PMH includes gastric adenocarcinoma s/p recent palliative gastrojejunostomy, HTN, large B cell lymphoma, DM, HLD, iron deficiency anemia.  Patient states he has been on a liquid diet at the SNF PTA. He is receiving cream soups, broths, pudding, ice cream. He occasionally drinks an Ensure supplement. He likes chocolate flavored Ensure and Boost.   Since admission, patient has been NPO d/t GI bleed, which is suspected to be from his gastric cancer. Plans for EGD today. His hgb has increased with 3 units PRBCs given last night and this morning.   Labs reviewed. Hgb 8.1 <-- 7.9 <-- 6.2 CBG: 82-86-85  Medications reviewed and include protonix.  Weight history reviewed. Current weight was carried over from last admission. Prior to that, patient had 11% weight loss within 3 months. Patient meets criteria for moderate malnutrition, given mild depletion of muscle and subcutaneous fat mass.  NUTRITION - FOCUSED PHYSICAL EXAM:  Flowsheet Row Most Recent Value  Orbital Region Mild depletion  Upper Arm Region Mild depletion  Thoracic and Lumbar Region No depletion  Buccal Region Mild depletion  Temple Region Mild depletion  Clavicle Bone Region Mild  depletion  Clavicle and Acromion Bone Region Mild depletion  Scapular Bone Region Mild depletion  Dorsal Hand Mild depletion  Patellar Region Mild depletion  Anterior Thigh Region Mild depletion  Posterior Calf Region Mild depletion  Edema (RD Assessment) Mild  Hair Reviewed  Eyes Reviewed  Mouth Reviewed  Skin Reviewed  Nails Reviewed       Diet Order:   Diet Order             Diet NPO time specified  Diet effective midnight                   EDUCATION NEEDS:   No education needs have been identified at this time  Skin:  Skin Assessment: Reviewed RN Assessment  Last BM:  11/25  Height:   Ht Readings from Last 1 Encounters:  07/19/23 5\' 11"  (1.803 m)    Weight:   Wt Readings from Last 1 Encounters:  07/19/23 90.5 kg    Ideal Body Weight:  78.2 kg  BMI:  Body mass index is 27.83 kg/m.  Estimated Nutritional Needs:   Kcal:  2250-2500  Protein:  120-140 gm  Fluid:  2.3-2.5 L   Gabriel Rainwater RD, LDN, CNSC Please refer to Amion for contact information.

## 2023-07-20 NOTE — Interval H&P Note (Signed)
History and Physical Interval Note:  07/20/2023 12:34 PM  [name].  has presented today for surgery, with the diagnosis of Acute on chronic anemia, melena, gastric cancer, history of gastrojejunostomy.  The various methods of treatment have been discussed with the patient and family. After consideration of risks, benefits and other options for treatment, the patient has consented to  Procedure(s): ESOPHAGOGASTRODUODENOSCOPY (EGD) WITH PROPOFOL (N/A) as a surgical intervention.  The patient's history has been reviewed, patient examined, no change in status, stable for surgery.  I have reviewed the patient's chart and labs.  Questions were answered to the patient's satisfaction.    We will proceed with EGD as scheduled.    I thoroughly discussed with the patient the procedure, including the risks involved. Patient understands what the procedure involves including the benefits and any risks. Patient understands alternatives to the proposed procedure. Risks including (but not limited to) bleeding, tearing of the lining (perforation), rupture of adjacent organs, problems with heart and lung function, infection, and medication reactions. A small percentage of complications may require surgery, hospitalization, repeat endoscopic procedure, and/or transfusion.  Patient understood and agreed.   [name]

## 2023-07-20 NOTE — Significant Event (Addendum)
Patient underwent EGD with control of bleeding .  Total procedure time : 40 seconds  A bleeding gastric mass was encountered in the antrum during the procedure and hemostasis was achieved with Hemo-spray .   Upon withdrawal of the scope patient was suddenly found to be in Ventricular Fibrillation arrest  . Cardiac compressions were started immediately with ACLS protocol , requiring 2 min of CPR and epinephrine resulting in ROSC . No defibrillation needed as patient converted to rapid atrial flutter.  Patient was intubated and placed on ventilator . Patient was found to be hypotensive for which central line was placed by anesthesia attending ( Dr. Leta Jungling) and Gloriajean Dell was placed by Mallie Snooks CRNA.   I spoke with patient wife Arieh Frasier and sister in person and updated on the event . All questions were answered .[facility]  present bedside to transfer patient to [facility] .

## 2023-07-20 NOTE — Progress Notes (Signed)
During upper endoscopy procedure with IV propofol the patient suddenly went into either V fib or asystole.  Chest compressions started immediately and 1 mg epinephrine given. Within 2 minutes the rhythm converted to rapid Atrial flutter without any defibrillation.  Patient was intubated and started on ventilator. Heart rate 140 or so and gradually came down to around 120.  Needed blood pressure support with boluses of epinephrine decreasing from 100 micrograms to 20 micrograms.  Started norepinephrine and epinephrine infusions and placed a right internal jugular central line.  Gradually was able to decrease the rate of both infusions and patient became stable with heart rate of 92 in PACU. Radial arterial line placed in PACU and patient was transferred to [facility] ICU in stable condition on ventilator with dexmetatomidine infusion. Versed 4 mg given in OR as soon as the blood pressure would tolerate it.  CXR reviewed and appears normal.  12 lead ECG done.  Cardiac panel drawn.  Ronelle Nigh MD  ( anesthesiologist ).

## 2023-07-20 NOTE — TOC Initial Note (Addendum)
Transition of Care (TOC) - Initial/Assessment Note    Patient Details  Name: [name]. MRN: [id number] Date of Birth: 1951/06/15  Transition of Care [facility]) CM/SW Contact:    Karn Cassis, LCSW Phone Number: 07/20/2023, 8:01 AM  Clinical Narrative:  Pt admitted due to GI bleed. Assessment completed with pt's wife. Pt has been a resident at [facility] for about a week for short term rehab. Family plan for pt to return to [facility] at d/c. Wife aware will need new authorization. MD notified for PT eval. Per Debbie at [facility], okay to return. TOC will follow.                  Expected Discharge Plan: Skilled Nursing Facility Barriers to Discharge: Continued Medical Work up   Patient Goals and CMS Choice Patient states their goals for this hospitalization and ongoing recovery are:: return to SNF   Choice offered to / list presented to : Spouse [place] ownership interest in [facility].provided to::  (n/a)    Expected Discharge Plan and Services In-house Referral: Clinical Social Work   Post Acute Care Choice: Skilled Nursing Facility Living arrangements for the past 2 months: Skilled Nursing Facility                                      Prior Living Arrangements/Services Living arrangements for the past 2 months: Skilled Nursing Facility Lives with:: Facility Resident Patient language and need for interpreter reviewed:: Yes Do you feel safe going back to the place where you live?: Yes            Criminal Activity/Legal Involvement Pertinent to Current Situation/Hospitalization: No - Comment as needed  Activities of Daily Living   ADL Screening (condition at time of admission) Independently performs ADLs?: No Does the patient have a NEW difficulty with bathing/dressing/toileting/self-feeding that is expected to last >3 days?: Yes (Initiates electronic notice to provider for possible OT consult) Does the patient have a NEW  difficulty with getting in/out of bed, walking, or climbing stairs that is expected to last >3 days?: Yes (Initiates electronic notice to provider for possible PT consult) Does the patient have a NEW difficulty with communication that is expected to last >3 days?: No Is the patient deaf or have difficulty hearing?: No Does the patient have difficulty seeing, even when wearing glasses/contacts?: No Does the patient have difficulty concentrating, remembering, or making decisions?: No  Permission Sought/Granted   Permission granted to share information with : Yes, Verbal Permission Granted     Permission granted to share info w AGENCY: [facility]  Permission granted to share info w Relationship: SNF     Emotional Assessment         Alcohol / Substance Use: Not Applicable Psych Involvement: No (comment)  Admission diagnosis:  GI bleed [K92.2] Gastrointestinal hemorrhage, unspecified gastrointestinal hemorrhage type [K92.2] Anemia, unspecified type [D64.9] Patient Active Problem List   Diagnosis Date Noted   GI bleed 07/19/2023   Superficial vein thrombosis 07/19/2023   Acute renal failure superimposed on stage 3a chronic kidney disease (HCC) 07/08/2023   Gastric adenocarcinoma (HCC) 07/05/2023   Gastric mass 07/02/2023   Malnutrition of moderate degree 07/02/2023   Gastric outlet obstruction 07/01/2023   Aortic atherosclerosis (HCC) 08/27/2022   Malignant neoplasm of prostate (HCC) 06/22/2022   Elevated PSA 05/14/2022   Vitamin D deficiency 01/25/2020  Uncontrolled type 2 diabetes mellitus with hyperglycemia (HCC) 12/14/2019   Cellulitis of lower extremity 12/14/2019   Autoimmune hemolytic anemia due to IgG (HCC)    Symptomatic anemia    Anemia 08/02/2019   Macrocytic anemia 08/02/2019   Atrial flutter (HCC) 09/07/2017   Bilateral pneumonia 09/07/2017   Acute respiratory failure with hypoxia (HCC) 05/19/2017   AKI (acute kidney injury) (HCC)    Sepsis due to pneumonia  (HCC) 05/18/2017   Type 2 diabetes mellitus with complication, without long-term current use of insulin (HCC) 02/19/2017   Essential hypertension, benign 04/03/2015   IDA (iron deficiency anemia)    Mucosal abnormality of stomach    Hx of adenomatous colonic polyps 06/13/2012   Status post autologous bone marrow transplantation (HCC) 07/15/2011   Venous insufficiency 04/29/2011   DLBCL (diffuse large B cell lymphoma) (HCC) 02/28/2009   Mixed hyperlipidemia 02/28/2009   Class 2 severe obesity due to excess calories with serious comorbidity and body mass index (BMI) of 36.0 to 36.9 in adult [facility]) 02/28/2009   DIASTOLIC HEART FAILURE, CHRONIC 02/28/2009   EDEMA 02/28/2009   PCP:  [name], MD Pharmacy:   [facility] [id number] - [place], [place] - [address] AT [facility] OF [address] & [address] [place] [address] Phone: [phone] Fax: [phone]  [facility] - [place], [place] - [address] [place] [address] Phone: [phone] Fax: [phone]     Social Determinants of Health (SDOH) Social History: SDOH Screenings   Food Insecurity: No Food Insecurity (07/19/2023)  Housing: Low Risk  (07/19/2023)  Transportation Needs: No Transportation Needs (07/19/2023)  Utilities: Not At Risk (07/19/2023)  Alcohol Screen: Low Risk  (06/28/2020)  Depression (PHQ2-9): Low Risk  (05/01/2020)  Financial Resource Strain: Low Risk  (06/28/2020)  Physical Activity: Inactive (06/28/2020)  Social Connections: Moderately Integrated (06/28/2020)  Stress: No Stress Concern Present (06/28/2020)  Tobacco Use: Medium Risk (07/19/2023)   SDOH Interventions:     Readmission Risk Interventions    07/20/2023    7:47 AM  Readmission Risk Prevention Plan  Transportation Screening Complete  HRI or Home Care Consult Complete  Social Work Consult for Recovery Care Planning/Counseling Complete  Palliative Care Screening Not  Applicable  Medication Review Oceanographer) Complete

## 2023-07-20 NOTE — Anesthesia Procedure Notes (Signed)
Central Venous Catheter Insertion Performed by: Ronelle Nigh, MD, anesthesiologist Start/End11/26/2024 3:15 PM, 07/20/2023 3:21 PM Patient location: OR. Emergency situation Position: Trendelenburg Hand hygiene performed  and maximum sterile barriers used  Total catheter length 16. Central line was placed.Procedure performed using ultrasound guided technique. Attempts: 1 Following insertion, line sutured, dressing applied and Biopatch. Post procedure assessment: blood return through all ports  Patient tolerated the procedure well with no immediate complications.

## 2023-07-20 NOTE — Progress Notes (Signed)
NAME:  Man Mom., MRN:  [id number], DOB:  [birth year], LOS: 1 ADMISSION DATE:  07/19/2023, CONSULTATION DATE:  07/20/2023 REFERRING MD: [place] - [facility] , CHIEF COMPLAINT: Shock postcardiac arrest  History of Present Illness:  72 year old man who is received in transfer from [facility] for shock following a cardiac arrest during an endoscopy performed to investigate upper GI bleeding.  The patient had presented from an acute rehab facility with complaints of melanotic stools without vomiting, abdominal pain or frank hematochezia.  His hemoglobin was 5.4.  He was transfused, started on a Protonix infusion, Kcentra was given to reverse anticoagulation, and GI was consulted.  He had recently and hospitalized from 11/7 to 11/21 for a gastric outlet obstruction due to a gastric carcinoma for which he underwent a palliative gastric jejunostomy on 11/13.  He was then transferred from rehabilitation from which she was readmitted to hospital this time.  He has a history of atrial flutter for which she was anticoagulated, autoimmune hemolytic anemia, stage III kidney disease, diastolic heart failure hypertension, diabetes and a prior history of diffuse B-cell lymphoma, status post autologous stem cell transplant in 2007.  He had also recently been diagnosed with a right upper extremity superficial phlebitis.  He underwent upper endoscopy today revealed a large fungating and ulcerated circumferential mass in the gastric antrum.  Hemostatic spray was deployed with successful hemostasis.  During the procedure the patient suffered a brief cardiac arrest requiring 2 minutes of CPR and 1 dose of epinephrine he spontaneously converted to atrial flutter without defibrillation.  Heart rate eventually improved spontaneously.  He was started on norepinephrine infusions to support his blood pressure.  The procedure was completed and the patient was transferred on dexmedetomidine for sedation.  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   autologous stem cell transplant 12/2005   Obesity, morbid (more than 100 lbs over ideal weight or BMI > 40) (HCC)    Venous insufficiency 04/29/2011   Past Surgical History:  Procedure Laterality Date   BACK SURGERY  2006   T6 vertebrae removed/titanium placed   BIOPSY  07/02/2023   Procedure: BIOPSY;  Surgeon: Franky Macho, MD;  [facility];  Service: Endoscopy;;   CATARACT EXTRACTION W/PHACO Right 03/31/2019   Procedure: CATARACT EXTRACTION PHACO AND INTRAOCULAR LENS PLACEMENT (IOC);  Surgeon: Fabio Pierce, MD;  Location: [facility];  Service: Ophthalmology;  Laterality: Right;  CDE: 3.21   CATARACT EXTRACTION W/PHACO Left 04/14/2019   Procedure: CATARACT EXTRACTION PHACO AND INTRAOCULAR LENS PLACEMENT (IOC);  Surgeon: Fabio Pierce, MD;  Location: [facility];  Service: Ophthalmology;  Laterality: Left;  left - pt knows to arrive at 7:45, CDE: 3.55   COLONOSCOPY  11/24/2004   Polyps in the left colon ablated/removed as described above.  Two submucosal lesions consistent with lipomas as described above not  manipulated./ Normal rectum   COLONOSCOPY  06/20/2012   MULTIPLE RECTAL AND COLONIC POLYPS   COLONOSCOPY N/A 03/08/2015   RMR: Capacious, redundant colon. Multiple colonic and rectosigmoid polyps removed. ablated as described above. colonic lipoma abnormal appearing  terminal ileum likely a variant of normal). Howeverwith history  biopsies obtained.    ESOPHAGOGASTRODUODENOSCOPY N/A 03/08/2015   RMR: Hiatal hernia Polypoid gastric mucosa with multiple gastric polyps. largest polyp removed via snare polypectomgy hemostasis  clip placed at base. Status post gastric biopsy. Status post video capsule placement.    ESOPHAGOGASTRODUODENOSCOPY (EGD) WITH PROPOFOL N/A 07/02/2023   Procedure: ESOPHAGOGASTRODUODENOSCOPY (EGD) WITH PROPOFOL;  Surgeon: Franky Macho, MD;  [facility];  Service: Endoscopy;  Laterality: N/A;   GASTROJEJUNOSTOMY N/A 07/07/2023   Procedure: GASTROJEJUNOSTOMY;  Surgeon: Lewie Chamber, DO;  Location: [facility];  Service: General;  Laterality: N/A;   GIVENS CAPSULE STUDY N/A 03/08/2015   Procedure: GIVENS CAPSULE STUDY;  Surgeon: Corbin Ade, MD;  [facility];  Service: Endoscopy;  Laterality: N/A;   GOLD SEED IMPLANT N/A 09/08/2022   Procedure: GOLD SEED IMPLANT;  Surgeon: Malen Gauze, MD;  Location: [facility];  Service: Urology;  Laterality: N/A;   LIMBAL STEM CELL TRANSPLANT     LUNG BIOPSY  6/06   MULTIPLE TOOTH EXTRACTIONS  04/2005   port a cath placement     PORT-A-CATH REMOVAL  09/08/2012   Procedure: REMOVAL PORT-A-CATH;  Surgeon: Loreli Slot, MD;  [facility];  Service: Thoracic;  Laterality: N/A;   SPACE OAR INSTILLATION N/A 09/08/2022   Procedure: SPACE OAR INSTILLATION;  Surgeon: Malen Gauze, MD;  Location: [facility];  Service: Urology;  Laterality: N/A;   No current facility-administered medications on file prior to encounter.   Current Outpatient Medications on File Prior to Encounter  Medication Sig Dispense Refill   apixaban (ELIQUIS) 5 MG TABS tablet Take 1 tablet (5 mg total) by mouth 2 (two) times daily. 60 tablet 11   bisacodyl (DULCOLAX) 10 MG suppository Place 1 suppository (10 mg total) rectally daily as needed for moderate constipation.     feeding supplement (ENSURE ENLIVE / ENSURE PLUS) LIQD Take 237 mLs by mouth 3 (three) times daily between meals. (Patient taking differently: Take 237 mLs by mouth 3 (three) times daily between meals. Recorded as "House Supplement" on MAR)     furosemide (LASIX) 20 MG tablet Take 1  tablet (20 mg total) by mouth daily as needed for fluid or edema. 90 tablet 2   metoprolol succinate (TOPROL XL) 25 MG 24 hr tablet Take 1 tablet (25 mg total) by mouth daily. Take with or immediately following a meal.     oxyCODONE (OXY IR/ROXICODONE) 5 MG immediate release tablet Take 1 tablet (5 mg total) by mouth every 6 (six) hours as needed for severe pain (pain score 7-10). 30 tablet 0   polyethylene glycol (MIRALAX / GLYCOLAX) 17 g packet Take 17 g by mouth daily.     potassium chloride (KLOR-CON) 10 MEQ tablet Take 10 meq on days you need to take Lasix (Patient taking differently: Take 10 mEq by mouth daily as needed (when taking Lasix). Take 10 meq on days you need to take Lasix) 90 tablet 1   prochlorperazine (COMPAZINE) 10 MG tablet Take 1 tablet (10 mg total) by mouth every 6 (six) hours as needed for nausea or vomiting.     rosuvastatin (CRESTOR) 40 MG tablet Take 40 mg by mouth daily.     simethicone (MYLICON) 80 MG chewable tablet Chew 1 tablet (80 mg total) by mouth every 6 (six) hours as needed for flatulence (obstipation).     torsemide (DEMADEX) 20 MG tablet Take 20 mg by mouth daily.  Vitamin D, Ergocalciferol, (DRISDOL) 1.25 MG (50000 UNIT) CAPS capsule TAKE 1 CAPSULE BY MOUTH ONCE WEEKLY 16 capsule 3   Blood Glucose Monitoring Suppl (ONE TOUCH ULTRA 2) w/Device KIT USE TO TEST BLOOD SUGAR ONCE D UTD     Lancets (ONETOUCH DELICA PLUS LANCET33G) MISC USE TO TEST TWICE DAILY 300 each 3   ONETOUCH ULTRA test strip TEST TWICE DAILY 100 strip 3     Significant Hospital Events: Including procedures, antibiotic start and stop dates in addition to other pertinent events   11/25-admitted to [facility] with melena. 11/26-evidence of fungating mass with active bleeding in the gastric antrum.  Successful hemostasis with hemostatic spray. 11/26-transfer to [facility] following brief cardiac arrest during endoscopy requiring intubation initiation of vasopressors.  Arterial  line and right IJ central line placed.  Interim History / Subjective:  Epinephrine weaned off prior to transport.  Patient is on norepinephrine alone.  Nods that he is in pain.  Objective   Blood pressure (!) 86/52, pulse (!) 109, temperature 98.6 F (37 C), temperature source Axillary, resp. rate (!) 26, height 5\' 11"  (1.803 m), weight 88.1 kg, SpO2 (!) 66%.    Vent Mode: PRVC FiO2 (%):  [40 %-100 %] 40 % Set Rate:  [16 bmp] 16 bmp Vt Set:  [520 mL-600 mL] 600 mL PEEP:  [5 cmH20] 5 cmH20 Plateau Pressure:  [30 cmH20] 30 cmH20   Intake/Output Summary (Last 24 hours) at 07/20/2023 1830 Last data filed at 07/20/2023 1635 Gross per 24 hour  Intake 1672.5 ml  Output 250 ml  Net 1422.5 ml   Filed Weights   07/19/23 1330 07/20/23 1800  Weight: 90.5 kg 88.1 kg    Examination: General: Man of average build. HENT: Orally intubated.  No OG tube in place.  Dry mucosa. Lungs: Diffuse rhonchi and wheezes on the right side. Cardiovascular: No JVD.  Heart sounds are unremarkable.  No peripheral edema. Abdomen: Soft and nontender.  No palpable masses. Extremities: Left upper extremity swollen from known superficial phlebitis.  Venous stasis changes in both lower extremities but no pretibial edema. Neuro: Currently sedated.  Will not open eyes but will follow commands.  Responds by nodding appropriately to questions. GU: Foley catheter in place with small amount of yellow urine.  Point-of-care echocardiogram shows essentially normal LV systolic function.  Evaluation made more difficult by frequent PVCs.  RV function appears normal but chamber appears underfilled.  Was unable to visualize IVC.  Ancillary tests personally reviewed:  ABG: 7.38/35/65/92% Normal electrolytes, creatinine mildly elevated 1.41 Hemoglobin steady 8.8, normal platelet count 217. Chest x-ray shows ET tube in good position.  Mild increased interstitial infiltrates.  Assessment & Plan:  Critically ill due to mixed  hypovolemic and distributive shock following cardiac arrest Suspect patient was hypovolemic and became shocky with administration of propofol for endoscopy. Acute hypoxic respiratory failure with possible aspiration causing reactive airway disease. Upper GI bleed secondary to gastric tumor AKI Known gastric carcinoma Status post gastrojejunostomy Diabetes  Plan:  -Titrate norepinephrine, vasopressin to keep MAP greater than 65.  Peri-code shock appears to be resolving, and I would expect vasopressors to wean off readily. -Appears to be volume responsive.  Bolus IV fluids and continue maintenance IV fluids overnight.  Hold home diuretic.  Hold home Toprol-XL -Follow-up creatinine, likely prerenal AKI.  -Follow hemoglobin for further fluid resuscitation may reveal worse anemia.  Have sent type and screen. -Continue PPI. -Added bronchodilators.  Continue full ventilatory support overnight on current settings. -  Start fentanyl for pain and wean off Precedex if possible.  This will help with blood pressure.  Patient appears to made a good neurological recovery.  May be extubated will in AM. -Sliding scale insulin.  Best Practice (right click and "Reselect all SmartList Selections" daily)   Diet/type: NPO no gastric tube given recent GI bleed. DVT prophylaxis: SCDs Start: 07/19/23 1926   Pressure ulcer(s): not present on admission  GI prophylaxis: PPI Lines: Central line, Arterial Line, and yes and it is still needed Foley:  Yes, and it is still needed Code Status:  full code Last date of multidisciplinary goals of care discussion: Wife updated over the phone.  I explained to her that I thought that the arrest was likely due to combination of dehydration and the effects of anesthesia but that the patient was doing much better and I was hopeful that he would be potentially extubated will by morning so long as he did not show signs of rebleeding.   CRITICAL CARE Performed by: Lynnell Catalan   Total critical care time: 40 minutes  Critical care time was exclusive of separately billable procedures and treating other patients.  Critical care was necessary to treat or prevent imminent or life-threatening deterioration.  Critical care was time spent personally by me on the following activities: development of treatment plan with patient and/or surrogate as well as nursing, discussions with consultants, evaluation of patient's response to treatment, examination of patient, obtaining history from patient or surrogate, ordering and performing treatments and interventions, ordering and review of laboratory studies, ordering and review of radiographic studies, pulse oximetry, re-evaluation of patient's condition and participation in multidisciplinary rounds.  Lynnell Catalan, MD [facility] ICU Physician [facility] [place] [facility]  Pager: [phone] Mobile: [phone] After hours: [phone].

## 2023-07-20 NOTE — Anesthesia Procedure Notes (Signed)
Procedure Name: Intubation Date/Time: 07/20/2023 2:00 PM  Performed by: Marquis Buggy, CRNAPre-anesthesia Checklist: Patient identified, Emergency Drugs available, Suction available, Patient being monitored and Timeout performed Patient Re-evaluated:Patient Re-evaluated prior to induction Laryngoscope Size: Mac and 4 Grade View: Grade I Tube type: Oral Tube size: 7.5 mm Number of attempts: 1 Airway Equipment and Method: Stylet Placement Confirmation: ETT inserted through vocal cords under direct vision, positive ETCO2 and breath sounds checked- equal and bilateral Secured at: 22 cm Tube secured with: Tape

## 2023-07-20 NOTE — Op Note (Signed)
[facility] Patient Name: [name] Procedure Date: 07/20/2023 1:28 PM MRN: [id number] Date of Birth: 12-23-1950 Attending MD: Sanjuan Dame , MD, [id number] CSN: [id number] Age: 72 Admit Type: Inpatient Procedure:                Upper GI endoscopy Indications:              Acute post hemorrhagic anemia, Melena Providers:                Sanjuan Dame, MD, Nena Polio, RN, Elinor Parkinson Referring MD:              Medicines:                Monitored Anesthesia Care Complications:            Cardiopulmonary arrest Estimated Blood Loss:     Estimated blood loss was minimal. Estimated blood                            loss was minimal. Procedure:                Pre-Anesthesia Assessment:                           - Prior to the procedure, a History and Physical                            was performed, and patient medications and                            allergies were reviewed. The patient's tolerance of                            previous anesthesia was also reviewed. The risks                            and benefits of the procedure and the sedation                            options and risks were discussed with the patient.                            All questions were answered, and informed consent                            was obtained. Prior Anticoagulants: The patient has                            taken Eliquis (apixaban), last dose was 2 days                            prior to procedure. ASA Grade Assessment: IV - A                            patient with severe systemic disease that is a  constant threat to life. After reviewing the risks                            and benefits, the patient was deemed in                            satisfactory condition to undergo the procedure.                           After obtaining informed consent, the endoscope was                            passed under direct vision. Throughout the                             procedure, the patient's blood pressure, pulse, and                            oxygen saturations were monitored continuously. The                            GIF-H190 ([id number]) scope was introduced through the                            mouth, and advanced to the second part of duodenum.                            The upper GI endoscopy was accomplished without                            difficulty. The patient tolerated the procedure                            poorly due to the patient's cardiovascular                            instability. Scope In: 2:32:54 PM Scope Out: 2:38:34 PM Total Procedure Duration: 0 hours 5 minutes 40 seconds  Findings:      The examined esophagus was normal.      Evidence of a gastrojejunostomy was found in the stomach. This was       characterized by healthy appearing mucosa.      A large, fungating and ulcerated, circumferential mass with oozing       bleeding and stigmata of recent bleeding was found in the gastric       antrum. To stop active bleeding, hemostatic spray was deployed. A single       spray was applied. There was no bleeding at the end of the procedure.      The duodenal bulb and second portion of the duodenum were normal. Impression:               - Normal esophagus.                           - A gastrojejunostomy was found, characterized by  healthy appearing mucosa.                           - Malignant gastric tumor in the gastric antrum.                            Hemostatic spray applied.                           - Normal duodenal bulb and second portion of the                            duodenum.                           - No specimens collected. Moderate Sedation:      Per Anesthesia Care Recommendation:           -Patient intubated , placed central line/Aline and                            transferred to [facility] for management                           -Continue IV PPI BID                            -Spoke with patient wife Chadi Midyette and sister                            in person and updated on the event                           -Maintain 2 large bore IV lines                           -CBC q6 hours and transfuse to keep HBG>7                           -Further management is per ICU team Procedure Code(s):        --- Professional ---                           [phone], Esophagogastroduodenoscopy, flexible,                            transoral; with control of bleeding, any method Diagnosis Code(s):        --- Professional ---                           Z98.0, Intestinal bypass and anastomosis status                           C16.3, Malignant neoplasm of pyloric antrum                           D62, Acute posthemorrhagic anemia  K92.1, Melena (includes Hematochezia) CPT copyright 2022 American Medical Association. All rights reserved. The codes documented in this report are preliminary and upon coder review may  be revised to meet current compliance requirements. Sanjuan Dame, MD Sanjuan Dame, MD 07/20/2023 5:21:51 PM This report has been signed electronically. Number of Addenda: 0

## 2023-07-20 NOTE — Progress Notes (Addendum)
07/20/2023 3:15 PM  Called by Dr. [name] patient had a cardiac arrest during procedure today lasting 2 mins then ROSC, now intubated. Discussed case with critical care PCCM at [facility] Accepted for transfer by Dr. Francine Graven and noted limited treatment options available given actively bleeding tumor.  Prognosis guarded. Transfer orders to ICU placed.  Updated Carelink and bed placement.    Maryln Manuel, MD How to contact the [facility] Attending or Consulting provider 7A - 7P or covering provider during after hours 7P -7A, for this patient?  Check the care team in [facility] and look for a) attending/consulting [facility] provider listed and b) the [facility] team listed Log into [email or web address] and use [place]'s universal password to access. If you do not have the password, please contact the hospital operator. Locate the [facility] provider you are looking for under [facility] and page to a number that you can be directly reached. If you still have difficulty reaching the provider, please page the [facility] (Director on Call) for the Hospitalists listed on amion for assistance.

## 2023-07-20 NOTE — Addendum Note (Signed)
Addendum  created 07/20/23 1710 by Ronelle Nigh, MD   Intraprocedure Event edited

## 2023-07-20 NOTE — Consult Note (Signed)
Consultation Note Date: 07/20/2023   Patient Name: [name].  DOB: 1951/07/01  MRN: [id number]  Age / Sex: 72 y.o., male  PCP: [name], MD Referring Physician: Cleora Fleet, MD  Reason for Consultation: Establishing goals of care  HPI/Patient Profile: 72 y.o. male  with past medical history of a flutter, autoimmune hemolytic anemia, CKD 3, diastolic heart failure, HTN/HLD, newly diagnosed gastric mass, diabetes, recent hospital stay with discharged to [facility] for short-term rehab admitted on 07/19/2023 with GI bleed.  Hospitalization 11/7 - 11/21 for gastric outlet obstruction due to gastric adenocarcinoma with palliative gastrojejunostomy 11/13.  EGD revealed large circumferential fungating malignant gastric tumor.  Clinical Assessment and Goals of Care: I have reviewed medical records including EPIC notes, labs and imaging, received report from RN, assessed the patient.  Mr. Salama is lying quietly in bed.  He appears acutely/chronically ill and frail.  He is resting comfortably, but wakes easily.  He will briefly make but not keep eye contact.  He is alert and oriented, able to make his needs known.  We meet at the bedside to discuss diagnosis prognosis, GOC, EOL wishes, disposition and options. I introduced Palliative Medicine as specialized medical care for people living with serious illness. It focuses on providing relief from the symptoms and stress of a serious illness. The goal is to improve quality of life for both the patient and the family.  We focused on their current illness.  We talk about his hospital stay last week and short-term rehab.  We talk about his GI bleed and the plan for EGD today.  We talked about time for outcomes.  I share that we will follow-up tomorrow.  He is in agreement.  No questions at this time.  The natural disease trajectory and expectations at EOL  were discussed.  Discussed the importance of continued conversation with family and the medical providers regarding overall plan of care and treatment options, ensuring decisions are within the context of the patient's values and GOCs.  Questions and concerns were addressed.  The family was encouraged to call with questions or concerns.  PMT will continue to support holistically.  Conference with attending, bedside nursing staff, transition of care team related to patient condition, needs, goals of care, disposition.    HCPOA NEXT OF KIN -wife, Rayn Amacker.    SUMMARY OF RECOMMENDATIONS   At this point continue to treat the treatable EGD 11/26 Anticipate return to rehab at [facility] Encouraged outpatient palliative   Code Status/Advance Care Planning: Full code -needs further goals of care/CODE STATUS discussions  Symptom Management:  Per hospitalist, no additional needs at this time.  Palliative Prophylaxis:  Frequent Pain Assessment, Oral Care, and Turn Reposition  Additional Recommendations (Limitations, Scope, Preferences): Full Scope Treatment  Psycho-social/Spiritual:  Desire for further Chaplaincy support:no Additional Recommendations: Caregiving  Support/Resources and Education on Hospice  Prognosis:  Unable to determine, based on outcomes.  Guarded at this point.  6 months or less would not be surprising based on  acuity of health concerns and cancer.  Discharge Planning: To be determined, anticipate return to short-term rehab at [facility] with the ultimate goal of returning home.      Primary Diagnoses: Present on Admission:  GI bleed  Atrial flutter (HCC)  DIASTOLIC HEART FAILURE, CHRONIC  Essential hypertension, benign  Gastric adenocarcinoma (HCC)  Gastric outlet obstruction  Type 2 diabetes mellitus with complication, without long-term current use of insulin (HCC)  Superficial vein thrombosis  Malnutrition of moderate degree   I have  reviewed the medical record, interviewed the patient and family, and examined the patient. The following aspects are pertinent.  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   autologous stem cell transplant 12/2005   Obesity, morbid (more than 100 lbs over ideal weight or BMI > 40) (HCC)    Venous insufficiency 04/29/2011   Social History   Socioeconomic History   Marital status: Married    Spouse name: Not on file   Number of children: 2   Years of education: Not on file   Highest education level: Not on file  Occupational History   Occupation: disability due to back    Employer: UNEMPLOYED  Tobacco Use   Smoking status: Former    Current packs/day: 0.00    Average packs/day: 0.5 packs/day for 15.0 years (7.5 ttl pk-yrs)    Types: Cigarettes    Start date: 11/11/1987    Quit date: 11/11/2002    Years since quitting: 20.7   Smokeless tobacco: Never  Vaping Use   Vaping status: Never Used  Substance and Sexual Activity   Alcohol use: No    Alcohol/week: 0.0 standard drinks of alcohol   Drug use: No   Sexual activity: Yes    Birth control/protection: None  Other Topics Concern   Not on file  Social History Narrative   Married   No regular exercise   Social Determinants of Health   Financial Resource Strain: Low Risk  (06/28/2020)   Overall Financial Resource Strain (CARDIA)    Difficulty of Paying Living Expenses: Not hard at all  Food Insecurity: No Food Insecurity (07/19/2023)   Hunger Vital Sign    Worried About Running Out of Food in the Last Year: Never true    Ran Out of Food in the Last Year: Never true  Transportation Needs: No Transportation Needs (07/19/2023)    PRAPARE - Administrator, Civil Service (Medical): No    Lack of Transportation (Non-Medical): No  Physical Activity: Inactive (06/28/2020)   Exercise Vital Sign    Days of Exercise per Week: 0 days    Minutes of Exercise per Session: 0 min  Stress: No Stress Concern Present (06/28/2020)   [employer] of Occupational Health - Occupational Stress Questionnaire    Feeling of Stress : Not at all  Social Connections: Moderately Integrated (06/28/2020)   Social Connection and Isolation Panel [NHANES]    Frequency of Communication with Friends and Family: More than three times a week    Frequency of Social Gatherings with Friends and Family: Twice a week    Attends Religious Services: More than  4 times per year    Active Member of Clubs or Organizations: No    Attends Banker Meetings: Never    Marital Status: Married   Family History  Problem Relation Age of Onset   Cancer Mother        lung   Hypertension Mother    Hyperlipidemia Mother    Cancer Father        prostate   Hypertension Father    Colon cancer Neg Hx    Diabetes Neg Hx    Scheduled Meds:  [MAR Hold] sodium chloride   Intravenous Once   [MAR Hold] metoprolol succinate  25 mg Oral Daily   [MAR Hold] pantoprazole (PROTONIX) IV  40 mg Intravenous Q12H   [MAR Hold] polyethylene glycol  17 g Oral Daily   [MAR Hold] torsemide  20 mg Oral Daily   Continuous Infusions: PRN Meds:.[MAR Hold] acetaminophen **OR** [MAR Hold] acetaminophen, [MAR Hold] ondansetron **OR** [MAR Hold] ondansetron (ZOFRAN) IV Medications Prior to Admission:  Prior to Admission medications   Medication Sig Start Date End Date Taking? Authorizing Provider  apixaban (ELIQUIS) 5 MG TABS tablet Take 1 tablet (5 mg total) by mouth 2 (two) times daily. 01/24/18  Yes Laqueta Linden, MD  bisacodyl (DULCOLAX) 10 MG suppository Place 1 suppository (10 mg total) rectally daily as needed for moderate constipation. 07/15/23  Yes  Vassie Loll, MD  feeding supplement (ENSURE ENLIVE / ENSURE PLUS) LIQD Take 237 mLs by mouth 3 (three) times daily between meals. Patient taking differently: Take 237 mLs by mouth 3 (three) times daily between meals. Recorded as "House Supplement" on [facility] 07/15/23  Yes Vassie Loll, MD  furosemide (LASIX) 20 MG tablet Take 1 tablet (20 mg total) by mouth daily as needed for fluid or edema. 07/30/22  Yes Strader, [place] M, PA-C  metoprolol succinate (TOPROL XL) 25 MG 24 hr tablet Take 1 tablet (25 mg total) by mouth daily. Take with or immediately following a meal. 07/15/23 07/14/24 Yes Vassie Loll, MD  oxyCODONE (OXY IR/ROXICODONE) 5 MG immediate release tablet Take 1 tablet (5 mg total) by mouth every 6 (six) hours as needed for severe pain (pain score 7-10). 07/15/23  Yes Vassie Loll, MD  polyethylene glycol (MIRALAX / GLYCOLAX) 17 g packet Take 17 g by mouth daily. 07/16/23  Yes Vassie Loll, MD  potassium chloride (KLOR-CON) 10 MEQ tablet Take 10 meq on days you need to take Lasix Patient taking differently: Take 10 mEq by mouth daily as needed (when taking Lasix). Take 10 meq on days you need to take Lasix 08/07/22  Yes Strader, [place] M, PA-C  prochlorperazine (COMPAZINE) 10 MG tablet Take 1 tablet (10 mg total) by mouth every 6 (six) hours as needed for nausea or vomiting. 07/15/23  Yes Vassie Loll, MD  rosuvastatin (CRESTOR) 40 MG tablet Take 40 mg by mouth daily. 05/04/19  Yes [provider]  simethicone (MYLICON) 80 MG chewable tablet Chew 1 tablet (80 mg total) by mouth every 6 (six) hours as needed for flatulence (obstipation). 07/15/23  Yes Vassie Loll, MD  torsemide (DEMADEX) 20 MG tablet Take 20 mg by mouth daily. 07/17/23  Yes [provider]  Vitamin D, Ergocalciferol, (DRISDOL) 1.25 MG (50000 UNIT) CAPS capsule TAKE 1 CAPSULE BY MOUTH ONCE WEEKLY 08/18/22  Yes Doreatha Massed, MD  Blood Glucose Monitoring Suppl (ONE TOUCH ULTRA 2) w/Device  KIT USE TO TEST BLOOD SUGAR ONCE D UTD 05/25/19   [provider]  Lancets [facility]  DELICA PLUS LANCET33G) MISC USE TO TEST TWICE DAILY 11/24/22   Dani Gobble, NP  [facility] ULTRA test strip TEST TWICE DAILY 11/22/20   Roma Kayser, MD   Allergies  Allergen Reactions   Penicillins Rash   Review of Systems  Unable to perform ROS: Acuity of condition    Physical Exam Vitals and nursing note reviewed.  Constitutional:      General: He is not in acute distress.    Appearance: He is ill-appearing.  Cardiovascular:     Rate and Rhythm: Normal rate.  Pulmonary:     Effort: Pulmonary effort is normal. No respiratory distress.  Skin:    General: Skin is warm and dry.  Neurological:     Mental Status: He is alert and oriented to person, place, and time.  Psychiatric:        Mood and Affect: Mood normal.        Behavior: Behavior normal.     Vital Signs: BP 133/60 (BP Location: Right Arm)   Pulse 84   Temp 98.4 F (36.9 C)   Resp 17   Ht 5\' 11"  (1.803 m)   Wt 90.5 kg   SpO2 100%   BMI 27.83 kg/m  Pain Scale: 0-10   Pain Score: 0-No pain   SpO2: SpO2: 100 % O2 Device:SpO2: 100 % O2 Flow Rate: .   IO: Intake/output summary:  Intake/Output Summary (Last 24 hours) at 07/20/2023 1510 Last data filed at 07/20/2023 0135 Gross per 24 hour  Intake 962.5 ml  Output --  Net 962.5 ml    LBM: Last BM Date : 07/19/23 Baseline Weight: Weight: 90.5 kg Most recent weight: Weight: 90.5 kg     Palliative Assessment/Data:     Time In: 1145 Time Out: 1225 Time Total: 40 minutes Greater than 50%  of this time was spent counseling and coordinating care related to the above assessment and plan.  Signed by: Katheran Awe, NP   Please contact Palliative Medicine Team phone at [phone] for questions and concerns.  For individual provider: See Loretha Stapler

## 2023-07-20 NOTE — Anesthesia Postprocedure Evaluation (Signed)
Anesthesia Post Note  Patient: Saurabh Carmona.  Procedure(s) Performed: ESOPHAGOGASTRODUODENOSCOPY (EGD) WITH PROPOFOL HEMOSTASIS CONTROL  Patient location during evaluation: PACU Anesthesia Type: General Level of consciousness: sedated and patient remains intubated per anesthesia plan Vital Signs Assessment: post-procedure vital signs reviewed and stable Respiratory status: patient on ventilator - see flowsheet for VS Cardiovascular status: stable Anesthetic complications: yes Comments: Cardiac arrest intra-op with rapid resolution. Stable on norepinephrine and epinephrine infusions.  Central line and radial Art line placed.  Precedex infusion. Intubated on vent.  Transfer to [facility] ICU ASAP.  Cardiac panel drawn in OR.   Encounter Notable Events  Notable Event Outcome Phase Comment  Cardiac arrest N/A Intraprocedure V fib for 2 minutes with immediate chest compressions. Converted to rapid Atrial flutter with 1 mg epinephrine/. No defibrilation.  Transferring to [facility] intubated on norepinephrine and epinephrine infusions.  Central line and radial A line placed.     Last Vitals:  Vitals:   07/20/23 1630 07/20/23 1635  BP:    Pulse: 95 98  Resp: (!) 22 16  Temp:    SpO2: 100% 100%    Last Pain:  Vitals:   07/20/23 1429  TempSrc:   PainSc: 0-No pain                 Aylen Stradford L Yarnell Arvidson

## 2023-07-20 NOTE — NC FL2 (Signed)
[place] MEDICAID FL2 LEVEL OF CARE FORM     IDENTIFICATION  Patient Name: [name]. Birthdate: 1951/08/22 Sex: male Admission Date (Current Location): 07/19/2023  [place] and [place] Number:  [employer] and Address:  [facility],  [address], [place] [address]      Provider Number: [id number]  Attending Physician Name and Address:  Cleora Fleet, MD  Relative Name and Phone Number:       Current Level of Care: Hospital Recommended Level of Care: Skilled Nursing Facility Prior Approval Number:    Date Approved/Denied:   PASRR Number:    Discharge Plan: SNF    Current Diagnoses: Patient Active Problem List   Diagnosis Date Noted   GI bleed 07/19/2023   Superficial vein thrombosis 07/19/2023   Acute renal failure superimposed on stage 3a chronic kidney disease (HCC) 07/08/2023   Gastric adenocarcinoma (HCC) 07/05/2023   Gastric mass 07/02/2023   Malnutrition of moderate degree 07/02/2023   Gastric outlet obstruction 07/01/2023   Aortic atherosclerosis (HCC) 08/27/2022   Malignant neoplasm of prostate (HCC) 06/22/2022   Elevated PSA 05/14/2022   Vitamin D deficiency 01/25/2020   Uncontrolled type 2 diabetes mellitus with hyperglycemia (HCC) 12/14/2019   Cellulitis of lower extremity 12/14/2019   Autoimmune hemolytic anemia due to IgG (HCC)    Symptomatic anemia    Anemia 08/02/2019   Macrocytic anemia 08/02/2019   Atrial flutter (HCC) 09/07/2017   Bilateral pneumonia 09/07/2017   Acute respiratory failure with hypoxia (HCC) 05/19/2017   AKI (acute kidney injury) (HCC)    Sepsis due to pneumonia (HCC) 05/18/2017   Type 2 diabetes mellitus with complication, without long-term current use of insulin (HCC) 02/19/2017   Essential hypertension, benign 04/03/2015   IDA (iron deficiency anemia)    Mucosal abnormality of stomach    Hx of adenomatous colonic polyps 06/13/2012   Status post autologous bone marrow  transplantation (HCC) 07/15/2011   Venous insufficiency 04/29/2011   DLBCL (diffuse large B cell lymphoma) (HCC) 02/28/2009   Mixed hyperlipidemia 02/28/2009   Class 2 severe obesity due to excess calories with serious comorbidity and body mass index (BMI) of 36.0 to 36.9 in adult [facility]) 02/28/2009   DIASTOLIC HEART FAILURE, CHRONIC 02/28/2009   EDEMA 02/28/2009    Orientation RESPIRATION BLADDER Height & Weight     Self, Place, Situation, Time  Normal Incontinent Weight: 199 lb 8.3 oz (90.5 kg) Height:  5\' 11"  (180.3 cm)  BEHAVIORAL SYMPTOMS/MOOD NEUROLOGICAL BOWEL NUTRITION STATUS      Incontinent Diet (See d/c summary)  AMBULATORY STATUS COMMUNICATION OF NEEDS Skin   Extensive Assist Verbally Other (Comment) (skin tear left buttocks. Incontinence associated dermatitis.)                       Personal Care Assistance Level of Assistance  Bathing, Feeding, Dressing Bathing Assistance: Maximum assistance Feeding assistance: Limited assistance Dressing Assistance: Maximum assistance     Functional Limitations Info  Sight, Hearing, Speech Sight Info: Impaired Hearing Info: Impaired Speech Info: Adequate    SPECIAL CARE FACTORS FREQUENCY  PT (By licensed PT)     PT Frequency: 5x weekly              Contractures      Additional Factors Info  Code Status, Allergies Code Status Info: Full code Allergies Info: Penicillins           Current Medications (07/20/2023):  This is the current hospital active  medication list Current Facility-Administered Medications  Medication Dose Route Frequency Provider Last Rate Last Admin   0.9 %  sodium chloride infusion (Manually program via Guardrails IV Fluids)   Intravenous Once Beatty, Celeste A, PA-C   Stopping previously hung infusion at 07/19/23 2310   acetaminophen (TYLENOL) tablet 650 mg  650 mg Oral Q6H PRN Emokpae, Ejiroghene E, MD       Or   acetaminophen (TYLENOL) suppository 650 mg  650 mg Rectal Q6H PRN  Emokpae, Ejiroghene E, MD       lactated ringers infusion   Intravenous Continuous Emokpae, Ejiroghene E, MD       ondansetron (ZOFRAN) tablet 4 mg  4 mg Oral Q6H PRN Emokpae, Ejiroghene E, MD       Or   ondansetron (ZOFRAN) injection 4 mg  4 mg Intravenous Q6H PRN Emokpae, Ejiroghene E, MD       pantoprazole (PROTONIX) injection 40 mg  40 mg Intravenous Q12H Emokpae, Ejiroghene E, MD   40 mg at 07/20/23 0132   polyethylene glycol (MIRALAX / GLYCOLAX) packet 17 g  17 g Oral Daily PRN Emokpae, Ejiroghene E, MD       torsemide (DEMADEX) tablet 20 mg  20 mg Oral Daily Emokpae, Ejiroghene E, MD         Discharge Medications: Please see discharge summary for a list of discharge medications.  Relevant Imaging Results:  Relevant Lab Results:   Additional Information SSN: [id number] [place], [place]

## 2023-07-20 NOTE — Transfer of Care (Signed)
Immediate Anesthesia Transfer of Care Note  Patient: [name].  Procedure(s) Performed: ESOPHAGOGASTRODUODENOSCOPY (EGD) WITH PROPOFOL HEMOSTASIS CONTROL  Patient Location: PACU  Anesthesia Type:General  Level of Consciousness: Patient remains intubated per anesthesia plan  Airway & Oxygen Therapy: Patient remains intubated per anesthesia plan and Patient placed on Ventilator (see vital sign flow sheet for setting)  Post-op Assessment: Report given to RN and Post -op Vital signs reviewed and stable  Post vital signs: Reviewed and stable  Last Vitals:  Vitals Value Taken Time  BP 99/69 07/20/23 1605  Temp 36.4 C 07/20/23 1541  Pulse 114 07/20/23 1606  Resp 26 07/20/23 1606  SpO2 100 % 07/20/23 1606  Vitals shown include unfiled device data.  Last Pain:  Vitals:   07/20/23 1429  TempSrc:   PainSc: 0-No pain         Complications: No notable events documented.

## 2023-07-21 ENCOUNTER — Inpatient Hospital Stay (HOSPITAL_COMMUNITY): Payer: Medicare Other

## 2023-07-21 ENCOUNTER — Encounter (HOSPITAL_COMMUNITY): Payer: Self-pay | Admitting: Gastroenterology

## 2023-07-21 DIAGNOSIS — K922 Gastrointestinal hemorrhage, unspecified: Secondary | ICD-10-CM | POA: Diagnosis not present

## 2023-07-21 DIAGNOSIS — R579 Shock, unspecified: Secondary | ICD-10-CM | POA: Diagnosis not present

## 2023-07-21 DIAGNOSIS — A419 Sepsis, unspecified organism: Secondary | ICD-10-CM | POA: Diagnosis not present

## 2023-07-21 DIAGNOSIS — I469 Cardiac arrest, cause unspecified: Secondary | ICD-10-CM

## 2023-07-21 DIAGNOSIS — J9601 Acute respiratory failure with hypoxia: Secondary | ICD-10-CM | POA: Diagnosis not present

## 2023-07-21 LAB — ECHOCARDIOGRAM COMPLETE
AR max vel: 3.24 cm2
AV Peak grad: 7.4 mm[Hg]
Ao pk vel: 1.36 m/s
Area-P 1/2: 3.49 cm2
Height: 71 in
MV M vel: 4.89 m/s
MV Peak grad: 95.6 mm[Hg]
S' Lateral: 2.7 cm
Weight: 3220.48 [oz_av]

## 2023-07-21 LAB — CBC WITH DIFFERENTIAL/PLATELET
Basophils Relative: 0 10*3/uL (ref 0.0–0.1)
Eosinophils Absolute: 0 10*3/uL (ref 0.0–0.5)
Eosinophils Relative: 0 10*3/uL (ref 0.0–0.5)
HCT: 25.7 % — ABNORMAL LOW (ref 39.0–52.0)
Hemoglobin: 8.3 g/dL — ABNORMAL LOW (ref 13.0–17.0)
Lymphocytes Relative: 3 %
Lymphs Abs: 0.2 10*3/uL — ABNORMAL LOW (ref 0.7–4.0)
MCH: 29.4 pg (ref 26.0–34.0)
MCHC: 32.3 g/dL (ref 30.0–36.0)
MCV: 91.1 fL (ref 80.0–100.0)
Monocytes Absolute: 0 10*3/uL (ref 0.1–1.0)
Monocytes Relative: 0.6 10*3/uL (ref 0.1–1.0)
Monocytes Relative: 9 10*3/uL (ref 0.7–4.0)
Neutro Abs: 5.8 10*3/uL (ref 1.7–7.7)
Neutrophils Relative %: 87 %
Other: 0.05 10*3/uL (ref 0.00–0.07)
Platelets: 204 10*3/uL (ref 150–400)
RBC: 2.82 MIL/uL — ABNORMAL LOW (ref 4.22–5.81)
RDW: 17.2 % — ABNORMAL HIGH (ref 11.5–15.5)
Smear Review: 1 %
WBC: 6.7 10*3/uL (ref 4.0–10.5)
nRBC: 3.7 % — ABNORMAL HIGH (ref 0.0–0.2)

## 2023-07-21 LAB — BASIC METABOLIC PANEL
Anion gap: 10 (ref 5–15)
BUN: 20 mg/dL (ref 8–23)
CO2: 21 mmol/L — ABNORMAL LOW (ref 22–32)
Calcium: 8.3 mg/dL — ABNORMAL LOW (ref 8.9–10.3)
Chloride: 111 mmol/L (ref 98–111)
Creatinine, Ser: 1.63 mg/dL — ABNORMAL HIGH (ref 0.61–1.24)
GFR, Estimated: 45 mL/min — ABNORMAL LOW (ref >60)
Glucose, Bld: 203 mg/dL — ABNORMAL HIGH (ref 70–99)
Potassium: 3.7 mmol/L (ref 3.5–5.1)
Sodium: 142 mmol/L (ref 135–145)

## 2023-07-21 LAB — CK TOTAL AND CKMB (NOT AT ARMC)
CK, MB: 1.4 ng/mL (ref 0.5–5.0)
Total CK: 35 U/L — ABNORMAL LOW (ref 49–397)

## 2023-07-21 LAB — CBC
HCT: 23.9 % — ABNORMAL LOW (ref 39.0–52.0)
Hemoglobin: 7.7 g/dL — ABNORMAL LOW (ref 13.0–17.0)
MCH: 29.2 pg (ref 26.0–34.0)
MCHC: 32.2 g/dL (ref 30.0–36.0)
MCV: 90.5 fL (ref 80.0–100.0)
Platelets: 172 10*3/uL (ref 150–400)
RBC: 2.64 MIL/uL — ABNORMAL LOW (ref 4.22–5.81)
RDW: 17 % — ABNORMAL HIGH (ref 11.5–15.5)
WBC: 7.9 10*3/uL (ref 4.0–10.5)
nRBC: 2 % — ABNORMAL HIGH (ref 0.0–0.2)

## 2023-07-21 LAB — GLUCOSE, CAPILLARY
Glucose-Capillary: 204 mg/dL — ABNORMAL HIGH (ref 70–99)
Glucose-Capillary: 210 mg/dL — ABNORMAL HIGH (ref 70–99)
Glucose-Capillary: 192 mg/dL — ABNORMAL HIGH (ref 70–99)
Glucose-Capillary: 103 mg/dL — ABNORMAL HIGH (ref 70–99)
Glucose-Capillary: 101 mg/dL — ABNORMAL HIGH (ref 70–99)
Glucose-Capillary: 89 mg/dL (ref 70–99)

## 2023-07-21 LAB — HEMOGLOBIN AND HEMATOCRIT, BLOOD
HCT: 27.9 % — ABNORMAL LOW (ref 39.0–52.0)
Hemoglobin: 9 g/dL — ABNORMAL LOW (ref 13.0–17.0)

## 2023-07-21 LAB — MAGNESIUM: Magnesium: 1.4 mg/dL — ABNORMAL LOW (ref 1.7–2.4)

## 2023-07-21 LAB — TROPONIN I (HIGH SENSITIVITY): Troponin I (High Sensitivity): 67 ng/L — ABNORMAL HIGH (ref <18)

## 2023-07-21 LAB — PREPARE RBC (CROSSMATCH)

## 2023-07-21 LAB — BRAIN NATRIURETIC PEPTIDE: B Natriuretic Peptide: 388.1 pg/mL — ABNORMAL HIGH (ref 0.0–100.0)

## 2023-07-21 MED ORDER — ORAL CARE MOUTH RINSE: 15.0000 mL | OROMUCOSAL | Status: DC | PRN

## 2023-07-21 MED ORDER — MAGNESIUM SULFATE 50 % IJ SOLN
3.0000 g | Freq: Once | INTRAVENOUS | Status: AC
  Administered 2023-07-21: 3 g via INTRAVENOUS
  Filled 2023-07-21: qty 6

## 2023-07-21 MED ORDER — ORAL CARE MOUTH RINSE
15.0000 mL | OROMUCOSAL | Status: DC
Start: 2023-07-21 — End: 2023-07-21
  Administered 2023-07-21: 15 mL via OROMUCOSAL

## 2023-07-21 MED ORDER — INSULIN ASPART 100 UNIT/ML IJ SOLN
0.0000 [IU] | INTRAMUSCULAR | Status: DC
  Administered 2023-07-21: 3 [IU] via SUBCUTANEOUS

## 2023-07-21 MED ORDER — POTASSIUM CHLORIDE 10 MEQ/100ML IV SOLN
10.0000 meq | INTRAVENOUS | Status: DC
  Filled 2023-07-21: qty 100

## 2023-07-21 MED ORDER — PERFLUTREN LIPID MICROSPHERE
1.0000 mL | INTRAVENOUS | Status: AC | PRN
  Administered 2023-07-21: 10 mL via INTRAVENOUS

## 2023-07-21 MED ORDER — LACTATED RINGERS IV SOLN: INTRAVENOUS | Status: DC

## 2023-07-21 MED ORDER — SODIUM CHLORIDE 0.9% IV SOLUTION: Freq: Once | INTRAVENOUS | Status: AC

## 2023-07-21 MED ORDER — POTASSIUM CHLORIDE 10 MEQ/50ML IV SOLN
10.0000 meq | INTRAVENOUS | Status: AC
Start: 2023-07-21 — End: 2023-07-21
  Administered 2023-07-21 (×3): 10 meq via INTRAVENOUS
  Filled 2023-07-21 (×3): qty 50

## 2023-07-21 MED ORDER — MAGNESIUM SULFATE 4 GM/100ML IV SOLN
4.0000 g | Freq: Once | INTRAVENOUS | Status: DC
  Filled 2023-07-21: qty 100

## 2023-07-21 NOTE — Progress Notes (Signed)
PCCM Progress Note   Patient reassessed this afternoon, he is now awake and follow commands on a SBT trial. Will extubate now. RN and RT updated on plan.   Inari Shin D. Harris, NP-C [facility] Personal contact information can be found on Amion  If no contact or response made please call [phone] 07/21/2023, 2:06 PM

## 2023-07-21 NOTE — Progress Notes (Signed)
PCCM Progress Note   Spoke with GI regarding anticoagulation and nutrition plan.   Recommendation made to hold on anticoagulation until we have definitively proven that bleeding has stopped as patient remains at high risk for ongoing bleeding given characteristics of mass.   GI also stated that attempts to place [place] though gastrojejunostomy should be fine if/when needed.  Zeshan Sena D. Harris, NP-C [facility] Personal contact information can be found on Amion  If no contact or response made please call [phone] 07/21/2023, 11:07 AM

## 2023-07-21 NOTE — Progress Notes (Signed)
NAME:  Frank Paris., MRN:  [id number], DOB:  1951-07-26, LOS: 2 ADMISSION DATE:  07/19/2023, CONSULTATION DATE:  07/20/2023 REFERRING MD: [place] - [facility] , CHIEF COMPLAINT: Shock postcardiac arrest  History of Present Illness:  72 year old man who is received in transfer from [facility] for shock following a cardiac arrest during an endoscopy performed to investigate upper GI bleeding.  The patient had presented from an acute rehab facility with complaints of melanotic stools without vomiting, abdominal pain or frank hematochezia.  His hemoglobin was 5.4.  He was transfused, started on a Protonix infusion, Kcentra was given to reverse anticoagulation, and GI was consulted.  He had recently and hospitalized from 11/7 to 11/21 for a gastric outlet obstruction due to a gastric carcinoma for which he underwent a palliative gastric jejunostomy on 11/13.  He was then transferred from rehabilitation from which she was readmitted to hospital this time.  He has a history of atrial flutter for which she was anticoagulated, autoimmune hemolytic anemia, stage III kidney disease, diastolic heart failure hypertension, diabetes and a prior history of diffuse B-cell lymphoma, status post autologous stem cell transplant in 2007.  He had also recently been diagnosed with a right upper extremity superficial phlebitis.  He underwent upper endoscopy today revealed a large fungating and ulcerated circumferential mass in the gastric antrum.  Hemostatic spray was deployed with successful hemostasis.  During the procedure the patient suffered a brief cardiac arrest requiring 2 minutes of CPR and 1 dose of epinephrine he spontaneously converted to atrial flutter without defibrillation.  Heart rate eventually improved spontaneously.  He was started on norepinephrine infusions to support his blood pressure.  The procedure was completed and the patient was transferred on dexmedetomidine for sedation.  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 11/26-evidence of fungating mass with active bleeding in the gastric antrum.  Successful hemostasis with hemostatic spray. Transfer to [facility] following brief cardiac arrest during endoscopy requiring intubation initiation of vasopressors.  Arterial line and right IJ central line placed. 11/27 no acute events overnight remains on low-dose pressors hemoglobin slightly down trended to 7.7  Interim History / Subjective:  Sleepy on vent, will awaken and follow simple commands  Objective   Blood pressure (!) 112/26, pulse (!) 41, temperature 98.8 F (37.1 C), temperature source Axillary, resp. rate 16, height 5\' 11"  (1.803 m), weight 91.3 kg, SpO2 100%.    Vent Mode: PRVC FiO2 (%):  [40 %-100 %] 50 % Set Rate:  [16 bmp] 16 bmp Vt Set:  [520 mL-600 mL] 600 mL PEEP:  [5 cmH20] 5 cmH20 Plateau Pressure:  [27 cmH20-30 cmH20] 30 cmH20   Intake/Output Summary (Last 24 hours) at 07/21/2023 0744 Last data filed at 07/21/2023 0639 Gross per 24  hour  Intake 3675.37 ml  Output 400 ml  Net 3275.37 ml   Filed Weights   07/19/23 1330 07/20/23 1800 07/21/23 0445  Weight: 90.5 kg 88.1 kg 91.3 kg    Examination: General: Acute on chronic ill-appearing elderly male lying in bed on mechanical ventilation in no acute  distress HEENT: ETT, MM pink/moist, PERRL,  Neuro: Opens eyes to verbal stimuli, intermittently following commands on vent CV: s1s2 regular rate and rhythm, no murmur, rubs, or gallops,  PULM: Clear to auscultation bilaterally, no increased work of breathing, no added breath sounds, tolerating ventilator GI: soft, bowel sounds active in all 4 quadrants, non-tender, non-distended Extremities: warm/dry, no edema  Skin: no rashes or lesions  Resolved Problems:    Assessment & Plan:  Critically ill due to mixed hypovolemic and distributive shock following cardiac arrest -Suspect patient was hypovolemic and became shocky with administration of propofol for endoscopy. P: No signs of bleeding overnight but hemoglobin down trended to 7.7 transfuse additional unit of PRBCs today Follow cultures as below Continue pressors for MAP goal greater than 65  In hospital cardiac arrest  -During the procedure the patient suffered a brief cardiac arrest requiring 2 minutes of CPR and 1 dose of epinephrine he spontaneously converted to atrial flutter without defibrillation.   Atrial flutter anticoagulated at baseline with Eliquis HFpEF Essential hypertension P: Normothermia protocol Continuous telemetry Anticoagulation remains on hold Strict intake and output Daily weight Assess daily for need to diurese GDMT as able  Acute hypoxic respiratory failure with possible aspiration causing reactive airway disease. P: Continue ventilator support with lung protective strategies  Wean PEEP and FiO2 for sats greater than 90%. Head of bed elevated 30 degrees. Plateau pressures less than 30 cm H20.  Follow intermittent chest x-ray and ABG.   SAT/SBT as tolerated, mentation preclude extubation  Ensure adequate pulmonary hygiene  Follow cultures  VAP bundle in place  PAD protocol  Upper GI bleed secondary to gastric tumor Known gastric carcinoma status post gastrojejunostomy P: GI following,  appreciate assistance Supportive care for hemorrhagic shock as above Ongoing goals of care discussion Additional unit PRBC as above  Occlusive thrombus to left iliac vein -Intake, coagulated baseline with Eliquis P:  Resume anticoagulation when able   AKI -Creatinine 1.63 with GFR 45 as of 11/27 compared to creatinine 1.23 with GFR greater than 6011/28/24 P: Follow renal function  Monitor urine output Trend Bmet Avoid nephrotoxins Ensure nsure adequate renal perfusion  IV hydration  Diabetes P: SSI CBG goal 140-180 CBG checks every 4  At risk malnutrition P: Supplement protein once able to utilize GI tract  Goals of care Palliative care following, last assessment 11/26, day of endoscopy, with plans to continue treating the treatable.  Given life-threatening bleed and now cardiac arrest ongoing goals of care discussions will be important.   Best Practice (right click and "Reselect all SmartList Selections" daily)   Diet/type: NPO no gastric tube given recent GI bleed. DVT prophylaxis: SCDs Start: 07/19/23 1926  Pressure ulcer(s): not present on admission  GI prophylaxis: PPI Lines: Central line, Arterial Line, and yes and it is still needed Foley:  Yes, and it is still needed Code Status:  full code Last date of multidisciplinary goals of care discussion: Wife updated over the phone.  I explained to her that I thought that the arrest was likely due to combination of dehydration and the effects of anesthesia but that the patient was doing much better and I was hopeful that he  would be potentially extubated will by morning so long as he did not show signs of rebleeding.   CRITICAL CARE Performed by: Alonte Wulff D. Harris  Total critical care time: 38 minutes  Critical care time was exclusive of separately billable procedures and treating other patients.  Critical care was necessary to treat or prevent imminent or life-threatening deterioration.  Critical care was time  spent personally by me on the following activities: development of treatment plan with patient and/or surrogate as well as nursing, discussions with consultants, evaluation of patient's response to treatment, examination of patient, obtaining history from patient or surrogate, ordering and performing treatments and interventions, ordering and review of laboratory studies, ordering and review of radiographic studies, pulse oximetry, re-evaluation of patient's condition and participation in multidisciplinary rounds.  Naliyah Neth D. Harris, NP-C [facility] Personal contact information can be found on Amion  If no contact or response made please call [phone] 07/21/2023, 7:47 AM

## 2023-07-21 NOTE — Procedures (Signed)
Extubation Procedure Note  Patient Details:   Name: [name]. DOB: [birth year] MRN: [id number]   Airway Documentation:    Vent end date: 07/21/23 Vent end time: 1418   Evaluation  O2 sats: stable throughout Complications: No apparent complications Patient did tolerate procedure well. Bilateral Breath Sounds: Clear, Diminished   Yes Was was successfully extubated with no apparent complications. Audible cuffleak was heard prior extubation and no signs of stridor at this time. Pt is currently able to state name and location and is able to protect airway. RT will monitor as needed this point forward.    Jolayne Panther 07/21/2023, 2:18 PM

## 2023-07-21 NOTE — Progress Notes (Signed)
PT Cancellation Note  Patient Details Name: [name]. MRN: [id number] DOB: 03/15/1951   Cancelled Treatment:    Reason Eval/Treat Not Completed: Medical issues which prohibited therapy. Pt remains intubated, currently weaning. PT will follow up once the pt is extubated and stable.   Arlyss Gandy 07/21/2023, 12:38 PM

## 2023-07-21 NOTE — Progress Notes (Signed)
Nutrition Follow-up  DOCUMENTATION CODES:   Non-severe (moderate) malnutrition in context of acute illness/injury  INTERVENTION:   If unable to use GI tract today, recommend start TPN since patient is malnourished.   When able to place NG/OG tube or Cortrak, recommend begin enteral nutrition: Vital 1.5 at 25 ml/h, increase by 10 ml every 4-8 hours to goal rate of 65 ml/h (1560 ml per day) Prosource TF20 60 ml BID Provides 2500 kcal, 145 gm protein, 1192 ml free water daily  When parenteral or enteral nutrition is initiated, will need to monitor magnesium, potassium, and phosphorus BID for at least 3 days, MD to replete as needed, as pt is at risk for refeeding syndrome given malnutrition with recent minimal intake.  NUTRITION DIAGNOSIS:   Moderate Malnutrition related to acute illness (recently diagnosed gastric cancer) as evidenced by mild muscle depletion, mild fat depletion.  Ongoing   GOAL:   Patient will meet greater than or equal to 90% of their needs  Unmet  MONITOR:   Diet advancement  REASON FOR ASSESSMENT:   Malnutrition Screening Tool    ASSESSMENT:   72 yo male admitted with GI bleed. PMH includes gastric adenocarcinoma s/p recent palliative gastrojejunostomy, HTN, large B cell lymphoma, DM, HLD, iron deficiency anemia.  S/P EGD 11/26 which showed a large fungating, ulcerated mass in the gastric antrum. Hemostasis was obtained with hemostatic spray. Patient had a brief cardiac arrest, requiring CPR, intubation, and transfer to [facility]. He remains intubated this morning. Palliative care team is following. Noted poor prognosis. He does not have enteral access d/t recent GI bleed. Currently on normothermia protocol.   Patient is intubated on ventilator support MV: 9.3 L/min Temp (24hrs), Avg:98.6 F (37 C), Min:97.6 F (36.4 C), Max:99.5 F (37.5 C)   Labs reviewed.  CBG: [phone]  Medications reviewed and include fentanyl, vasopressin, levophed,  potassium chloride, magnesium sulfate, precedex, novolog, protonix. IVF: LR at 50 ml/h.  Diet Order:   Diet Order             Diet NPO time specified  Diet effective now                   EDUCATION NEEDS:   No education needs have been identified at this time  Skin:  Skin Assessment: Skin Integrity Issues: Skin Integrity Issues:: Stage II Stage II: L buttocks  Last BM:  11/25  Height:   Ht Readings from Last 1 Encounters:  07/20/23 5\' 11"  (1.803 m)    Weight:   Wt Readings from Last 1 Encounters:  07/21/23 91.3 kg    Ideal Body Weight:  78.2 kg  BMI:  Body mass index is 28.07 kg/m.  Estimated Nutritional Needs:   Kcal:  2250-2500  Protein:  125-145 gm  Fluid:  2.3-2.5 L   Gabriel Rainwater RD, LDN, CNSC Please refer to Amion for contact information.

## 2023-07-21 NOTE — Progress Notes (Signed)
   07/21/23 1137  Daily Weaning Assessment  Daily Assessment of Readiness to Wean Wean protocol criteria met (SBT performed)  SBT Method CPAP 5 cm H20 and PS 5 cm H20 (PS 10)  Weaning Start Time 1137   Pt was placed on CPAP/PS 10/5 40% mode on ventilator. Pt is tolerating well taking low Vte but maintaining adequate Mve at this time. MD aware.

## 2023-07-21 NOTE — Progress Notes (Signed)
Echocardiogram 2D Echocardiogram has been performed.  Lucendia Herrlich 07/21/2023, 10:00 AM

## 2023-07-22 ENCOUNTER — Other Ambulatory Visit: Payer: Self-pay | Admitting: Oncology

## 2023-07-22 DIAGNOSIS — E440 Moderate protein-calorie malnutrition: Secondary | ICD-10-CM

## 2023-07-22 DIAGNOSIS — K921 Melena: Secondary | ICD-10-CM | POA: Diagnosis not present

## 2023-07-22 LAB — RENAL FUNCTION PANEL
Albumin: 1.8 g/dL — ABNORMAL LOW (ref 3.5–5.0)
Anion gap: 7 (ref 5–15)
BUN: 27 mg/dL — ABNORMAL HIGH (ref 8–23)
CO2: 25 mmol/L (ref 22–32)
Calcium: 8.3 mg/dL — ABNORMAL LOW (ref 8.9–10.3)
Chloride: 110 mmol/L (ref 98–111)
Creatinine, Ser: 1.88 mg/dL — ABNORMAL HIGH (ref 0.61–1.24)
GFR, Estimated: 38 mL/min — ABNORMAL LOW (ref >60)
Glucose, Bld: 99 mg/dL (ref 70–99)
Phosphorus: 4.1 mg/dL (ref 2.5–4.6)
Potassium: 4.7 mmol/L (ref 3.5–5.1)
Sodium: 142 mmol/L (ref 135–145)

## 2023-07-22 LAB — MAGNESIUM: Magnesium: 2.8 mg/dL — ABNORMAL HIGH (ref 1.7–2.4)

## 2023-07-22 LAB — GLUCOSE, CAPILLARY
Glucose-Capillary: 97 mg/dL (ref 70–99)
Glucose-Capillary: 80 mg/dL (ref 70–99)
Glucose-Capillary: 85 mg/dL (ref 70–99)

## 2023-07-22 MED ORDER — FUROSEMIDE 10 MG/ML IJ SOLN
40.0000 mg | Freq: Once | INTRAMUSCULAR | Status: AC
  Administered 2023-07-22: 40 mg via INTRAVENOUS
  Filled 2023-07-22: qty 4

## 2023-07-22 NOTE — Plan of Care (Signed)
  Problem: Education: Goal: Knowledge of General Education information will improve Description: Including pain rating scale, medication(s)/side effects and non-pharmacologic comfort measures Outcome: Progressing   Problem: Health Behavior/Discharge Planning: Goal: Ability to manage health-related needs will improve Outcome: Progressing   Problem: Clinical Measurements: Goal: Ability to maintain clinical measurements within normal limits will improve Outcome: Progressing Goal: Will remain free from infection Outcome: Progressing Goal: Diagnostic test results will improve Outcome: Progressing Goal: Respiratory complications will improve Outcome: Progressing Goal: Cardiovascular complication will be avoided Outcome: Progressing   Problem: Activity: Goal: Risk for activity intolerance will decrease Outcome: Progressing   Problem: Nutrition: Goal: Adequate nutrition will be maintained Outcome: Progressing   Problem: Coping: Goal: Level of anxiety will decrease Outcome: Progressing   Problem: Elimination: Goal: Will not experience complications related to bowel motility Outcome: Progressing Goal: Will not experience complications related to urinary retention Outcome: Progressing   Problem: Pain Management: Goal: General experience of comfort will improve Outcome: Progressing   Problem: Safety: Goal: Ability to remain free from injury will improve Outcome: Progressing   Problem: Skin Integrity: Goal: Risk for impaired skin integrity will decrease Outcome: Progressing   Problem: Education: Goal: Ability to describe self-care measures that may prevent or decrease complications (Diabetes Survival Skills Education) will improve Outcome: Progressing Goal: Individualized Educational Video(s) Outcome: Progressing   Problem: Coping: Goal: Ability to adjust to condition or change in health will improve Outcome: Progressing   Problem: Fluid Volume: Goal: Ability to  maintain a balanced intake and output will improve Outcome: Progressing   Problem: Health Behavior/Discharge Planning: Goal: Ability to identify and utilize available resources and services will improve Outcome: Progressing Goal: Ability to manage health-related needs will improve Outcome: Progressing   Problem: Metabolic: Goal: Ability to maintain appropriate glucose levels will improve Outcome: Progressing   Problem: Nutritional: Goal: Maintenance of adequate nutrition will improve Outcome: Progressing Goal: Progress toward achieving an optimal weight will improve Outcome: Progressing   Problem: Skin Integrity: Goal: Risk for impaired skin integrity will decrease Outcome: Progressing   Problem: Tissue Perfusion: Goal: Adequacy of tissue perfusion will improve Outcome: Progressing   Problem: Activity: Goal: Ability to tolerate increased activity will improve Outcome: Progressing   Problem: Respiratory: Goal: Ability to maintain a clear airway and adequate ventilation will improve Outcome: Progressing   Problem: Role Relationship: Goal: Method of communication will improve Outcome: Progressing

## 2023-07-22 NOTE — Evaluation (Signed)
Clinical/Bedside Swallow Evaluation Patient Details  Name: [name]. MRN: [id number] Date of Birth: 1951-08-16  Today's Date: 07/22/2023 Time: SLP Start Time (ACUTE ONLY): 0840 SLP Stop Time (ACUTE ONLY): 0910 SLP Time Calculation (min) (ACUTE ONLY): 30 min  Past Medical History:  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   autologous stem cell transplant 12/2005   Obesity, morbid (more than 100 lbs over ideal weight or BMI > 40) (HCC)    Venous insufficiency 04/29/2011   Past Surgical History:  Past Surgical History:  Procedure Laterality Date   BACK SURGERY  2006   T6 vertebrae removed/titanium placed   BIOPSY  07/02/2023   Procedure: BIOPSY;  Surgeon: Franky Macho, MD;  [facility];  Service: Endoscopy;;   CATARACT EXTRACTION W/PHACO Right 03/31/2019   Procedure: CATARACT EXTRACTION PHACO AND INTRAOCULAR LENS PLACEMENT (IOC);  Surgeon: Fabio Pierce, MD;  Location: [facility];  Service: Ophthalmology;  Laterality: Right;  CDE: 3.21   CATARACT EXTRACTION W/PHACO Left 04/14/2019   Procedure: CATARACT EXTRACTION PHACO AND INTRAOCULAR LENS PLACEMENT (IOC);  Surgeon: Fabio Pierce, MD;  Location: [facility];  Service: Ophthalmology;  Laterality: Left;  left - pt knows to arrive at 7:45, CDE: 3.55   COLONOSCOPY  11/24/2004   Polyps in the left colon ablated/removed as described above.  Two submucosal lesions consistent with lipomas as described above not  manipulated./ Normal rectum   COLONOSCOPY  06/20/2012   MULTIPLE RECTAL AND COLONIC POLYPS   COLONOSCOPY N/A 03/08/2015   RMR: Capacious, redundant colon. Multiple colonic and rectosigmoid polyps removed. ablated as described  above. colonic lipoma abnormal appearing terminal ileum likely a variant of normal). Howeverwith history  biopsies obtained.    ESOPHAGOGASTRODUODENOSCOPY N/A 03/08/2015   RMR: Hiatal hernia Polypoid gastric mucosa with multiple gastric polyps. largest polyp removed via snare polypectomgy hemostasis clip placed at base. Status post gastric biopsy. Status post video capsule placement.    ESOPHAGOGASTRODUODENOSCOPY (EGD) WITH PROPOFOL N/A 07/02/2023   Procedure: ESOPHAGOGASTRODUODENOSCOPY (EGD) WITH PROPOFOL;  Surgeon: Franky Macho, MD;  [facility];  Service: Endoscopy;  Laterality: N/A;   ESOPHAGOGASTRODUODENOSCOPY (EGD) WITH PROPOFOL N/A 07/20/2023   Procedure: ESOPHAGOGASTRODUODENOSCOPY (EGD) WITH PROPOFOL;  Surgeon: Franky Macho, MD;  [facility];  Service: Endoscopy;  Laterality: N/A;   GASTROJEJUNOSTOMY N/A 07/07/2023   Procedure: GASTROJEJUNOSTOMY;  Surgeon: Lewie Chamber, DO;  Location: [facility];  Service: General;  Laterality: N/A;   GIVENS CAPSULE STUDY N/A 03/08/2015   Procedure: GIVENS CAPSULE STUDY;  Surgeon: Corbin Ade, MD;  [facility];  Service: Endoscopy;  Laterality: N/A;   GOLD SEED IMPLANT N/A 09/08/2022   Procedure: GOLD SEED IMPLANT;  Surgeon: Malen Gauze, MD;  Location: [facility];  Service: Urology;  Laterality: N/A;   HEMOSTASIS CONTROL  07/20/2023   Procedure: HEMOSTASIS CONTROL;  Surgeon: Franky Macho, MD;  [facility];  Service: Endoscopy;;   LIMBAL STEM CELL TRANSPLANT     LUNG BIOPSY  6/06   MULTIPLE TOOTH EXTRACTIONS  04/2005   port a cath placement     PORT-A-CATH  REMOVAL  09/08/2012   Procedure: REMOVAL PORT-A-CATH;  Surgeon: Loreli Slot, MD;  [facility];  Service: Thoracic;  Laterality: N/A;   SPACE OAR INSTILLATION N/A 09/08/2022   Procedure: SPACE OAR INSTILLATION;  Surgeon: Malen Gauze, MD;  Location: [facility];  Service: Urology;  Laterality: N/A;   HPI:  72 year  old man who remains critically ill following brief cardiac arrest during upper endoscopy for upper GI bleed from gastric malignancy.  Arrest likely due to combination of hypovolemia and anesthesia. Intubated 11/26-11/27. Pt vomited after extubation. RN concerned for regurgitation of bowel. Pt belching during swallow screen attempts.  CXR prior to extubation: Increasing vascular congestion and perihilar airspace disease,  with trace bilateral pleural effusions, consistent with worsening  volume status and pulmonary edema  EGD 11/26 which showed a large fungating, ulcerated mass in the gastric antrum. Hemostasis was obtained with hemostatic spray. Patient had a brief cardiac arrest, requiring CPR, intubation, and transfer to [facility]. UGI on 11/18 - There is a moderate-to-large amount of reflux noted to the level of  the mid/distal esophagus. History of gastric outlet obstruction, post surgical  creation of a gastrojejunostomy.    Assessment / Plan / Recommendation  Clinical Impression  Pt demonstrates signs of a multifactoral dysphagia involving deconditioning, cognition, and likely severe esophageal dysphagia and GI issues at baseline. Today pt takes only tiny sips of thin liquids or puree. Pt had to be asked to please take another sip after barely any intake. He orally held boluses, was observed to belch often. There were no immediate signs of aspiration such as coughing or wet vocal quality. SLP called wife who reported poor intake for up to a month prior to initial visit to ED three weeks ago due to weakness. Pt was taking in very little and vomiting often. Today pt is capable of taking sips of liquid, but not enough calories to address needs. He is at risk of regurgitation, vomiting and postprandial aspiration. Will initiate clear liquids, precautions posted at the head of the bed. SLP unlikely to be of great benefit in this situation. MD can advance or restrict diet based on pts overall function, primary needs  are medical in nature, no need for swallowing therapy. SLP Visit Diagnosis: Dysphagia, unspecified (R13.10)    Aspiration Risk  Risk for inadequate nutrition/hydration;Severe aspiration risk    Diet Recommendation Thin liquid    Liquid Administration via: Straw;Cup Medication Administration: Crushed with puree Supervision: Full supervision/cueing for compensatory strategies Postural Changes: Seated upright at 90 degrees;Remain upright for at least 30 minutes after po intake    Other  Recommendations Oral Care Recommendations: Oral care QID    Recommendations for follow up therapy are one component of a multi-disciplinary discharge planning process, led by the attending physician.  Recommendations may be updated based on patient status, additional functional criteria and insurance authorization.  Follow up Recommendations        Assistance Recommended at Discharge    Functional Status Assessment    Frequency and Duration            Prognosis        Swallow Study   General HPI: 72 year old man who remains critically ill following brief cardiac arrest during upper endoscopy for upper GI bleed from gastric malignancy.  Arrest likely due to combination of hypovolemia and anesthesia. Intubated 11/26-11/27. Pt vomited after extubation. RN concerned for regurgitation of bowel. Pt belching during swallow screen attempts.  CXR prior to extubation: Increasing  vascular congestion and perihilar airspace disease,  with trace bilateral pleural effusions, consistent with worsening  volume status and pulmonary edema  EGD 11/26 which showed a large fungating, ulcerated mass in the gastric antrum. Hemostasis was obtained with hemostatic spray. Patient had a brief cardiac arrest, requiring CPR, intubation, and transfer to [facility]. UGI on 11/18 - There is a moderate-to-large amount of reflux noted to the level of  the mid/distal esophagus. History of gastric outlet obstruction, post surgical  creation of a  gastrojejunostomy. Type of Study: Bedside Swallow Evaluation Previous Swallow Assessment: none Diet Prior to this Study: Thin liquids (Level 0) Temperature Spikes Noted: No Respiratory Status: Room air History of Recent Intubation: Yes Total duration of intubation (days): 2 days Date extubated: 07/21/23 Behavior/Cognition: Lethargic/Drowsy;Requires cueing Oral Cavity Assessment: Dry Oral Care Completed by SLP: Recent completion by staff Oral Cavity - Dentition: Missing dentition;Poor condition Vision: Impaired for self-feeding Self-Feeding Abilities: Total assist Patient Positioning: Upright in bed Baseline Vocal Quality: Low vocal intensity Volitional Cough: Cognitively unable to elicit Volitional Swallow: Unable to elicit    Oral/Motor/Sensory Function Overall Oral Motor/Sensory Function: Generalized oral weakness   Ice Chips Ice chips: Impaired Oral Phase Functional Implications: Prolonged oral transit   Thin Liquid Thin Liquid: Impaired Presentation: Straw Oral Phase Functional Implications: Oral holding    Nectar Thick Nectar Thick Liquid: Not tested   Honey Thick Honey Thick Liquid: Not tested   Puree Puree: Impaired Oral Phase Functional Implications: Oral holding   Solid     Solid: Not tested      [name], [name] 07/22/2023,10:06 AM

## 2023-07-22 NOTE — Progress Notes (Signed)
Daily Progress Note   Patient Name: [name].       Date: 07/22/2023 DOB: 14-May-1951  Age: 72 y.o. MRN#: [id number] Attending Physician: Cheri Fowler, MD Primary Care Physician: [name], MD Admit Date: 07/19/2023  Reason for Consultation/Follow-up: Establishing goals of care  Subjective: Medical records reviewed including progress notes, labs, imaging. Patient assessed at the bedside.  He is lethargic, answers some yes/no questions but has a hard time holding detailed discussion.  He does give consent to reach out to his wife today.  He recalls previously speaking with my colleague Lillia Carmel, NP.  I then called patient's wife and introduced the role of palliative medicine as an extra layer of support.  She had not previously participated in discussions patient had with PMT.  Explored her understanding of patient's acute illness and she feels she has a "pretty good" understanding, though she wanted to know more about what medications he was getting.  Reviewed this with her as well as his overall care plan.    Her main concern is patient's eating and she was glad to have discussion with SLP by phone today.  Upon reviewing patient's acute illness, I explained that while he is off vasopressor support, his heart function unfortunately remains a concern as well as several other comorbidities.  Emphasized the importance of anticipatory care planning and goals of care discussion.  She asked "his heart is getting stronger though right?" I provided with updates on yesterday's worsening echo with EF of 30 to 35% in comparison to echo with EF of 55 to 60% from December 2023.  She states that while she understands medical professionals are required to give this information/report, she has strong  belief in God and the power of his healing.  She awaits further miracles and relies heavily on her faith to cope through patient's acute illness.  I offered a spiritual care consult and she states that they did have a chaplain visit at [facility].  She will speak with patient further before proceeding with referral.  Explored her thoughts and feelings on CODE STATUS in light of patient's recent CPR/intubation.  Shared that some patients may feel they wanted to have attempted this once and should cardiopulmonary arrest occur again, that this is God's will and they would prefer to allow a peaceful death.  Patient's wife reflects on her conversations with him and that he has always wanted for the medical team to "do everything they can."  She wishes to continue with full code at this time.  Questions and concerns addressed.  Provided with PMT contact information.  Provided update on pending transfer out of the ICU.  PMT will continue to support holistically.   Length of Stay: 3   Physical Exam Vitals and nursing note reviewed.  Constitutional:      General: He is not in acute distress.    Appearance: He is ill-appearing.     Interventions: Nasal cannula in place.     Comments: 2L [place]  Cardiovascular:     Rate and Rhythm: Normal rate.  Pulmonary:     Effort: Tachypnea present.  Neurological:     Mental Status: He is easily aroused. He is lethargic.  Vital Signs: BP (!) 120/44 (BP Location: Right Leg)   Pulse (!) 101   Temp 98.8 F (37.1 C) (Axillary)   Resp (!) 22   Ht 5\' 11"  (1.803 m)   Wt 91.3 kg   SpO2 100%   BMI 28.07 kg/m  SpO2: SpO2: 100 % O2 Device: O2 Device: Nasal Cannula O2 Flow Rate: O2 Flow Rate (L/min): 3 L/min      Palliative Assessment/Data: 20%    Palliative Care Assessment & Plan   Patient Profile: 72 y.o. male  with past medical history of a flutter, autoimmune hemolytic anemia, CKD 3, diastolic heart failure, HTN/HLD, newly diagnosed gastric mass,  diabetes, recent hospital stay with discharged to [facility] for short-term rehab admitted on 07/19/2023 with GI bleed.  Hospitalization 11/7 - 11/21 for gastric outlet obstruction due to gastric adenocarcinoma with palliative gastrojejunostomy 11/13.  EGD revealed large circumferential fungating malignant gastric tumor.   Assessment: Goals of care conversation Distributive/hemorrhagic shock, resolved Status post in-hospital V-fib cardiac arrest postprocedure  Chronic HFrEF Acute hypoxic respiratory failure, was extubated yesterday  AKI, creatinine worsening  Recommendations/Plan: Continue full code/full scope treatment Patient is too lethargic to participate in GOC discussion today.  Patient's wife is very hopeful for miracles and recovery, noting that aggressive care is always what patient would have wanted in the past Psychosocial and emotional support provided PMT will continue to follow and support intermittently as needed   Prognosis: Poor long-term prognosis  Discharge Planning: To Be Determined  Care plan was discussed with patient, patient's wife, MD   MDM high         Danyka Merlin Jeni Salles, PA-C  Palliative Medicine Team Team phone # [phone]  Thank you for allowing the Palliative Medicine Team to assist in the care of this patient. Please utilize secure chat with additional questions, if there is no response within 30 minutes please call the above phone number.  Palliative Medicine Team providers are available by phone from 7am to 7pm daily and can be reached through the team cell phone.  Should this patient require assistance outside of these hours, please call the patient's attending physician.

## 2023-07-22 NOTE — Progress Notes (Signed)
NAME:  Frank Hoult., MRN:  [id number], DOB:  [birth year], LOS: 3 ADMISSION DATE:  07/19/2023, CONSULTATION DATE:  07/20/2023 REFERRING MD: [place] - [facility] , CHIEF COMPLAINT: Shock postcardiac arrest  History of Present Illness:  72 year old man who is received in transfer from [facility] for shock following a cardiac arrest during an endoscopy performed to investigate upper GI bleeding.  The patient had presented from an acute rehab facility with complaints of melanotic stools without vomiting, abdominal pain or frank hematochezia.  His hemoglobin was 5.4.  He was transfused, started on a Protonix infusion, Kcentra was given to reverse anticoagulation, and GI was consulted.  He had recently and hospitalized from 11/7 to 11/21 for a gastric outlet obstruction due to a gastric carcinoma for which he underwent a palliative gastric jejunostomy on 11/13.  He was then transferred from rehabilitation from which she was readmitted to hospital this time.  He has a history of atrial flutter for which she was anticoagulated, autoimmune hemolytic anemia, stage III kidney disease, diastolic heart failure hypertension, diabetes and a prior history of diffuse B-cell lymphoma, status post autologous stem cell transplant in 2007.  He had also recently been diagnosed with a right upper extremity superficial phlebitis.  He underwent upper endoscopy today revealed a large fungating and ulcerated circumferential mass in the gastric antrum.  Hemostatic spray was deployed with successful hemostasis.  During the procedure the patient suffered a brief cardiac arrest requiring 2 minutes of CPR and 1 dose of epinephrine he spontaneously converted to atrial flutter without defibrillation.  Heart rate eventually improved spontaneously.  He was started on norepinephrine infusions to support his blood pressure.  The procedure was completed and the patient was transferred on dexmedetomidine for sedation.  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   autologous stem cell transplant 12/2005   Obesity, morbid (more than 100 lbs over ideal weight or BMI > 40) (HCC)    Venous insufficiency 04/29/2011   Significant Hospital Events: Including procedures, antibiotic start and stop dates in addition to other pertinent events   11/25-admitted to [facility] with melena. 11/26-evidence of fungating mass with active bleeding in the gastric antrum.  Successful hemostasis with hemostatic spray. Transfer to [facility] following brief cardiac arrest during endoscopy requiring intubation initiation of vasopressors.  Arterial line and right IJ central line placed. 11/27 no acute events overnight remains on low-dose pressors hemoglobin slightly down trended to 7.7  Interim History / Subjective:  He is lethargic but opens eyes with vocal stimuli, following simple commands Remained afebrile Successfully extubated yesterday  Objective   Blood pressure (!) 120/44, pulse (!) 101, temperature 98.8 F (37.1 C), temperature source Axillary, resp. rate (!) 22, height 5\' 11"  (1.803 m), weight 91.3 kg, SpO2 100%.    Vent Mode: PSV;CPAP FiO2 (%):  [40 %] 40 % PEEP:  [5 cmH20] 5 cmH20 Pressure Support:  [10 cmH20] 10 cmH20   Intake/Output Summary (Last 24 hours) at 07/22/2023 0956 Last data filed at 07/22/2023 0800 Gross per 24 hour  Intake 1584.29 ml  Output 290  ml  Net 1294.29 ml   Filed Weights   07/19/23 1330 07/20/23 1800 07/21/23 0445  Weight: 90.5 kg 88.1 kg 91.3 kg    Examination: General: Acute on chronically ill-appearing elderly male, lying on the bed HEENT: [place]/AT, eyes  anicteric.  moist mucus membranes Neuro: Sleepy, opens eyes with vocal stimuli, following simple commands Chest: Coarse breath sounds, no wheezes or rhonchi Heart: Irregular, no murmurs or gallops Abdomen: Soft, nontender, nondistended, bowel sounds present Skin: No rash  Labs and images reviewed  Resolved Problems:    Assessment & Plan:  Status post in-hospital V-fib cardiac arrest postprocedure Combined distributive/hemorrhagic shock, resolved Continue telemetry monitoring Monitor electrolytes He is off vasopressor support Continue supportive care  Atrial flutter anticoagulated at baseline with Eliquis Chronic biventricular HFrEF Hypertension Patient remained in atrial flutter with controlled rate His echocardiogram showed EF 30% with diffuse hypokinesis also with reduced RV function Continue gentle diuresis Monitor intake and output Not ready for GDMT due to borderline low blood pressure  Acute hypoxic respiratory failure with possible aspiration pneumonitis causing reactive airway disease. Patient was successfully extubated yesterday, remained on 2 L nasal cannula oxygen Titrate with O2 sat goal 92% Encourage incentive spirometry  Upper GI bleed secondary to bleeding gastric tumor Known gastric carcinoma status post palliative gastrojejunostomy GI is following Continue Protonix H&H remained stable  Superficial venous thrombosis of left cephalic vein No good data to anticoagulate for SVT  AKI Serum creatinine continue to trend up Stop IV fluid Will try Lasix Avoid nephrotoxic agent Monitor intake and output  Diabetes type II Patient's last hemoglobin A1c is 4.6 Continue sliding scale insulin with CBG goal 140-180  Goals of care Palliative care following, last assessment 11/26, day of endoscopy, with plans to continue treating the treatable.  Given life-threatening bleed and now cardiac arrest ongoing goals of care discussions will be important.   Best  Practice (right click and "Reselect all SmartList Selections" daily)   Diet/type: Clear liquid diet DVT prophylaxis: SCDs Start: 07/19/23 1926  Pressure ulcer(s): not present on admission  GI prophylaxis: PPI Lines: Central line and arterial line, discontinue Foley: Yes, discontinue Code Status:  full code Last date of multidisciplinary goals of care discussion: 11/26: Wife updated over the phone.      Cheri Fowler, MD [facility] See Amion for pager If no response to pager, please call [phone] until 7pm After 7pm, Please call E-link [phone]

## 2023-07-23 ENCOUNTER — Inpatient Hospital Stay (HOSPITAL_COMMUNITY): Payer: Medicare Other

## 2023-07-23 DIAGNOSIS — I4892 Unspecified atrial flutter: Secondary | ICD-10-CM | POA: Diagnosis not present

## 2023-07-23 DIAGNOSIS — I5022 Chronic systolic (congestive) heart failure: Secondary | ICD-10-CM | POA: Diagnosis not present

## 2023-07-23 DIAGNOSIS — R0989 Other specified symptoms and signs involving the circulatory and respiratory systems: Secondary | ICD-10-CM

## 2023-07-23 DIAGNOSIS — I4901 Ventricular fibrillation: Secondary | ICD-10-CM

## 2023-07-23 DIAGNOSIS — I82890 Acute embolism and thrombosis of other specified veins: Secondary | ICD-10-CM | POA: Diagnosis not present

## 2023-07-23 DIAGNOSIS — D62 Acute posthemorrhagic anemia: Secondary | ICD-10-CM | POA: Diagnosis not present

## 2023-07-23 LAB — BASIC METABOLIC PANEL
Anion gap: 7 (ref 5–15)
BUN: 33 mg/dL — ABNORMAL HIGH (ref 8–23)
CO2: 27 mmol/L (ref 22–32)
Calcium: 8 mg/dL — ABNORMAL LOW (ref 8.9–10.3)
Chloride: 111 mmol/L (ref 98–111)
Creatinine, Ser: 1.96 mg/dL — ABNORMAL HIGH (ref 0.61–1.24)
GFR, Estimated: 36 mL/min — ABNORMAL LOW (ref >60)
Glucose, Bld: 93 mg/dL (ref 70–99)
Potassium: 4.3 mmol/L (ref 3.5–5.1)
Sodium: 145 mmol/L (ref 135–145)

## 2023-07-23 LAB — CBC WITH DIFFERENTIAL/PLATELET
Abs Immature Granulocytes: 0.01 10*3/uL (ref 0.00–0.07)
Basophils Absolute: 0 10*3/uL (ref 0.0–0.1)
Basophils Relative: 0 %
Eosinophils Absolute: 0 10*3/uL (ref 0.0–0.5)
Eosinophils Relative: 0 %
HCT: 24.5 % — ABNORMAL LOW (ref 39.0–52.0)
Hemoglobin: 7.4 g/dL — ABNORMAL LOW (ref 13.0–17.0)
Immature Granulocytes: 0 %
Lymphocytes Relative: 3 %
Lymphs Abs: 0.2 10*3/uL — ABNORMAL LOW (ref 0.7–4.0)
MCH: 28 pg (ref 26.0–34.0)
MCHC: 30.2 g/dL (ref 30.0–36.0)
MCV: 92.8 fL (ref 80.0–100.0)
Monocytes Absolute: 0.5 10*3/uL (ref 0.1–1.0)
Monocytes Relative: 9 %
Neutro Abs: 4.6 10*3/uL (ref 1.7–7.7)
Neutrophils Relative %: 88 %
Platelets: 107 10*3/uL — ABNORMAL LOW (ref 150–400)
RBC: 2.64 MIL/uL — ABNORMAL LOW (ref 4.22–5.81)
RDW: 18.4 % — ABNORMAL HIGH (ref 11.5–15.5)
WBC: 5.3 10*3/uL (ref 4.0–10.5)
nRBC: 0 % (ref 0.0–0.2)

## 2023-07-23 LAB — GLUCOSE, CAPILLARY
Glucose-Capillary: 73 mg/dL (ref 70–99)
Glucose-Capillary: 75 mg/dL (ref 70–99)
Glucose-Capillary: 77 mg/dL (ref 70–99)
Glucose-Capillary: 83 mg/dL (ref 70–99)
Glucose-Capillary: 83 mg/dL (ref 70–99)
Glucose-Capillary: 83 mg/dL (ref 70–99)
Glucose-Capillary: 86 mg/dL (ref 70–99)
Glucose-Capillary: 86 mg/dL (ref 70–99)

## 2023-07-23 LAB — CBC
HCT: 26.5 % — ABNORMAL LOW (ref 39.0–52.0)
Hemoglobin: 8 g/dL — ABNORMAL LOW (ref 13.0–17.0)
MCH: 28.2 pg (ref 26.0–34.0)
MCHC: 30.2 g/dL (ref 30.0–36.0)
MCV: 93.3 fL (ref 80.0–100.0)
Platelets: 105 10*3/uL — ABNORMAL LOW (ref 150–400)
RBC: 2.84 MIL/uL — ABNORMAL LOW (ref 4.22–5.81)
RDW: 18.2 % — ABNORMAL HIGH (ref 11.5–15.5)
WBC: 4.1 10*3/uL (ref 4.0–10.5)
nRBC: 0 % (ref 0.0–0.2)

## 2023-07-23 LAB — PHOSPHORUS: Phosphorus: 4.2 mg/dL (ref 2.5–4.6)

## 2023-07-23 LAB — MAGNESIUM: Magnesium: 2.9 mg/dL — ABNORMAL HIGH (ref 1.7–2.4)

## 2023-07-23 MED ORDER — BOOST / RESOURCE BREEZE PO LIQD CUSTOM
1.0000 | Freq: Three times a day (TID) | ORAL | Status: DC
  Administered 2023-07-30 (×3): 1 via ORAL

## 2023-07-23 MED ORDER — METRONIDAZOLE 500 MG/100ML IV SOLN
500.0000 mg | Freq: Two times a day (BID) | INTRAVENOUS | Status: DC
  Administered 2023-07-23 – 2023-07-28 (×11): 500 mg via INTRAVENOUS
  Filled 2023-07-23 (×12): qty 100

## 2023-07-23 MED ORDER — SODIUM CHLORIDE 0.9 % IV SOLN
2.0000 g | INTRAVENOUS | Status: DC
  Administered 2023-07-23 – 2023-07-28 (×6): 2 g via INTRAVENOUS
  Filled 2023-07-23 (×6): qty 20

## 2023-07-23 MED ORDER — FUROSEMIDE 10 MG/ML IJ SOLN
40.0000 mg | Freq: Once | INTRAMUSCULAR | Status: AC
  Administered 2023-07-23: 40 mg via INTRAVENOUS
  Filled 2023-07-23: qty 4

## 2023-07-23 MED ORDER — INSULIN ASPART 100 UNIT/ML IJ SOLN: 0.0000 [IU] | Freq: Three times a day (TID) | INTRAMUSCULAR | Status: DC

## 2023-07-23 NOTE — Evaluation (Signed)
Occupational Therapy Evaluation Patient Details Name: [name]. MRN: [id number] DOB: June 19, 1951 Today's Date: 07/23/2023   History of Present Illness Patient is a 72 y.o.  male who was recently diagnosed with gastric outlet obstruction  secondary to gastric adenocarcinoma-s/p palliative gastrojejunostomy on 11/13-presented to the hospital with upper GI bleeding in the setting of Eliquis for A-fib.  Initially admitted at [facility] he was stabilized-underwent EGD-which was complicated by V-fib arrest.  Patient was intubated and subsequently transferred to ICU here at [facility]   Clinical Impression   This 72 yo male admitted with above presents to acute OT with PLOF before last hospital admission of being independent to Mod I with basic ADLs and mobility. He was discharged to SNF and came back to hospital 3 day later. He currently is total A for all basic ADLs at bed level and requires 2 people to roll in bed. He is limited in his UE ROM and strength due to increased edema (propped up on 2 pillows per each arm before session end today). He will continue to benefit from acute OT with follow up from continued inpatient follow up therapy, <3 hours/day.        If plan is discharge home, recommend the following: Two people to help with walking and/or transfers;Two people to help with bathing/dressing/bathroom;Assistance with cooking/housework;Assist for transportation;Help with stairs or ramp for entrance;Assistance with feeding;Direct supervision/assist for financial management;Direct supervision/assist for medications management    Functional Status Assessment  Patient has had a recent decline in their functional status and demonstrates the ability to make significant improvements in function in a reasonable and predictable amount of time.  Equipment Recommendations  Other (comment) (TBD next venue)       Precautions / Restrictions Precautions Precautions: Fall Restrictions Weight Bearing  Restrictions: No             ADL either performed or assessed with clinical judgement   ADL Overall ADL's : Needs assistance/impaired Eating/Feeding: Total assistance;Bed level   Grooming: Total assistance;Bed level Grooming Details (indicate cue type and reason): pt did attempt to reach up and rub his chin with LUE but due to increased edema in arm above/below elbow joint he cannot get it flexed enough to reach his chin Upper Body Bathing: Total assistance;Bed level   Lower Body Bathing: Total assistance;Bed level   Upper Body Dressing : Total assistance;Bed level   Lower Body Dressing: Total assistance;Bed level                       Vision Baseline Vision/History: 1 Wears glasses (reading and driving but mainly reading per wife) Patient Visual Report: No change from baseline Additional Comments: could tell me the number of fingers I was holding up in all visual fields            Pertinent Vitals/Pain Pain Assessment Pain Assessment: No/denies pain     Extremity/Trunk Assessment Upper Extremity Assessment Upper Extremity Assessment: Generalized weakness;Right hand dominant;RUE deficits/detail;LUE deficits/detail RUE Deficits / Details: edematous mid forarm to upper arm; wife reports this started last admission; pt can A with raising arm up in air and holding with slow controlled lag RUE Coordination: decreased fine motor;decreased gross motor LUE Deficits / Details: edematous mid forarm to upper arm; wife reports this started last admission; pt can A with raising arm up in air and holding with slow controlled lag LUE Coordination: decreased gross motor;decreased fine motor      Communication Communication Communication: Difficulty communicating thoughts/reduced  clarity of speech;Difficulty following commands/understanding Following commands: Follows one step commands inconsistently;Follows one step commands with increased time Cueing Techniques: Tactile  cues;Gestural cues;Verbal cues;Visual cues   Cognition Arousal:  (eyes open, but staring off in to space a majority of time.) Behavior During Therapy: Flat affect                                   General Comments: Pt kept perseverating on 1943 (year he and wife were married)--this was the first question I asked him and he did not vary from this response until I asked him his last name;  reports the staring is because he is thinking and trying to understand what I am saying; he will regard speaker eye to eye contact when his name is called or when asked to look at speaker                Home Living Family/patient expects to be discharged to:: Skilled nursing facility   Available Help at Discharge: Family;Available 24 hours/day Type of Home: Apartment Home Access: Level entry     Home Layout: One level     Bathroom Shower/Tub: Sponge bathes at baseline;Tub/shower unit   Bathroom Toilet: Standard     Home Equipment: Pharmacist, hospital (2 wheels);Cane - single point;Grab bars - tub/shower   Additional Comments: Pt wife present and states that he was at [facility] for 3 days prior to readmission      Prior Functioning/Environment Prior Level of Function : Needs assist             Mobility Comments: Pt wife reports he had not  been up out of bed at SNF;  but was very weak and used SPC short household distances prior to last hospitalization ADLs Comments: Independent prior to previous admission.        OT Problem List: Decreased strength;Decreased range of motion;Decreased activity tolerance;Impaired balance (sitting and/or standing);Impaired vision/perception;Impaired UE functional use;Increased edema      OT Treatment/Interventions: Self-care/ADL training;DME and/or AE instruction;Balance training;Patient/family education;Therapeutic exercise    OT Goals(Current goals can be found in the care plan section) Acute Rehab OT Goals Patient Stated Goal:  Wife--for him to get better, to know why his arms are swollen and what is wrong with his eyes (staring) OT Goal Formulation: With patient/family Time For Goal Achievement: 08/06/23 Potential to Achieve Goals: Fair  OT Frequency: Min 1X/week       AM-PAC OT "6 Clicks" Daily Activity     Outcome Measure Help from another person eating meals?: Total Help from another person taking care of personal grooming?: Total Help from another person toileting, which includes using toliet, bedpan, or urinal?: Total Help from another person bathing (including washing, rinsing, drying)?: Total Help from another person to put on and taking off regular upper body clothing?: Total Help from another person to put on and taking off regular lower body clothing?: Total 6 Click Score: 6   End of Session Nurse Communication:  (per secure chat : wife feeding and wife to let RN know before she leaves to RN can reapply Bil hand mitts.)  Activity Tolerance: Patient tolerated treatment well Patient left: in bed;with call bell/phone within reach;with bed alarm set;with family/visitor present (in room feeding pateint (wife))  OT Visit Diagnosis: Other abnormalities of gait and mobility (R26.89);Muscle weakness (generalized) (M62.81);Cognitive communication deficit (R41.841)  Time: 4098-1191 OT Time Calculation (min): 17 min Charges:  OT General Charges $OT Visit: 1 Visit OT Evaluation $OT Eval Moderate Complexity: 1 Mod  Cathy L. OT Acute Rehabilitation Services Office [phone]    Evette Georges 07/23/2023, 3:40 PM

## 2023-07-23 NOTE — Progress Notes (Signed)
Staples removed from abdominal incision per MD order

## 2023-07-23 NOTE — Progress Notes (Signed)
Brief Note Per family/RN patient now more congested-weak/lethargic a while ago x 1  On exam  Lethargic-but awakens-speaks slowly No accumulation of secretions Lungs: transmitted upper airway sounds in all lung zones  Suspect Asp PNA  Plan Rocephin/flagyl IV dose of IV lasix Keep NPO CXR Pulmonary toileting as much as we can Spoke both spouse/daughter-then with daughter Charlene Brooke tenuous situation-he has multiple issues-very frail-at risk for further decompensation Have urged daughter to speak with spouse-DNR recommended-family discussion in progress.

## 2023-07-23 NOTE — Consult Note (Addendum)
Cardiology Consultation   Patient ID: [name]. MRN: [id number]; DOB: 1950-12-19  Admit date: 07/19/2023 Date of Consult: 07/23/2023  PCP:  [name], MD   [facility] Providers Cardiologist:  Dina Rich, MD       Patient Profile:   [name]. is a 72 y.o. male with a history of chronic diastolic CHF, persistent atrial flutter on Eliquis, pleural effusion s/p right thoracentesis in 07/2019, chronic lower extremity edema felt to largely be due to chronic venous insufficiency, recent left cephalic vein thrombus, hypertension, hyperlipidemia, type 2 diabetes mellitus, iron deficiency anemia, diffuse large B cell lymphoma, and recent gastric outlet obstruction secondary to gastric adenocarcinoma who is being seen 07/23/2023 for the evaluation of ventricular fibrillation arrest at the request of Dr. Jerral Ralph.  History of Present Illness:   [name] is a 72 year old male with the above history who is followed by Dr. Dina Rich. Patient is primarily followed by Dr. Wyline Mood for atrial flutter and chronic diastolic CHF. He also has chronic lower extremity edema felt to be due to chronic venous insufficiency for which he follows with Vascular Surgery. Prior Myoview in 03/2015 was low risk with a small defect due to soft tissue attenuation artifact. Last Echo in 07/2022 showed LVEF of 55-60% with normal wall motion, mild LVH, and grade 2 diastolic dysfunction as well as mild MR. Chronic venous doppler of right lower extremity in 03/2023 showed no evidence of DVT but common femora, mid femoral, popliteal, great saphenous, and small saphenous veins were all not competent.   He was last seen by Dr. Dina Rich in 08/2022 at which time he reported ongoing lower extremity edema but was otherwise stable from a cardiac standpoint.  Patient was recently admitted from 07/01/2023 to 07/15/2023 for gastric outlet obstruction after presenting with early satiety, bloating,  poor appetite and PO intake, and nausea. CT on admission showed irregular thickening throughout the gastric antrum concerning for gastric carcinoma vs gastric lymphoma with an element of gastric out obstruction. He underwent EGD on 11/8 which showed a large circumferential fungating malignant gastric tumor at the pylorus and in the gastric antrum. Biopsy was taken and showed invasive adenocarcinoma with signet ring cell features. UGI on 11/18 showed a patent gastrojejunostomy anastomosis without evidence of stricture or contrast extravasation. Outpatient follow-up with General Surgery and Oncology were recommended. General Surgery recommended an full liquid diet until outpatient follow-up.  Of note, upper extremity dopplers during admission also showed an occlusive thrombus within the left cephalic vein at the level of the antecubital fossa. Eliquis was restarted on 11/17. He was discharged to a SNF.  He presented back to the ED on 07/19/2023 for further evaluation of acute on chronic anemia with hemoglobin of 5.4. Upon arrival to the ED, patient was hemodynamically stable. Hemoglobin was 6.2. GI was consulted and patient was given 2 units of PRBCs. Patient underwent EGD on 11/26 which showed a bleeding gastric mass in the antrum. Hemostasis was achieved with hemo-spray. Upon withdrawal of the scope, patient went into ventricular fibrillation. CPR was started immediately with ACLS protocol. ROSC was obtained after 2 minutes of CPR and 1 dose of Epinephrine. He was intubated and started on Levophed and Vasopressin given hypotension. He was able to be extubated and pressors were able to be weaned off on 11/27. He was given another unit of PRBCs on 11/27. Echo on 11/27 showed LVEF of 30-35% with global hypokinesis, moderately reduced RV function with moderately elevated  PASP, severe left atrial enlargement, and moderate MR. Palliative Care was consulted and patient/ family would continue to like full scope of care.  Cardiology consulted for further evaluation of ventricular fibrillation arrest.  At the time of this evaluation, patient is resting comfortably in no acute distress. 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 His only complaint this morning is feeling a little confused. He was able to tell me his name, birthday, and place (hospital, city, state). However, he could not tell me what month or year it was or what brought him into the hospital. No family is at bedside. He denies any pain this morning. He denies any recent chest pain. He does describe some shortness of breath at rest recently but unclear when this started. He does have a wet sounding cough. He denies any lightheadedness/ dizziness or overt abnormal bleeding in urine or stools. It was difficult to get much else of a history given patient's confusion.  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   autologous stem cell transplant 12/2005   Obesity, morbid (more than 100 lbs over ideal weight or BMI > 40) (HCC)    Venous insufficiency 04/29/2011    Past Surgical History:  Procedure Laterality Date   BACK SURGERY  2006   T6 vertebrae removed/titanium placed   BIOPSY  07/02/2023   Procedure: BIOPSY;  Surgeon: Franky Macho, MD;  [facility];  Service: Endoscopy;;   CATARACT EXTRACTION W/PHACO Right 03/31/2019   Procedure: CATARACT EXTRACTION PHACO AND INTRAOCULAR LENS PLACEMENT (IOC);  Surgeon: Fabio Pierce, MD;  Location: [facility];  Service: Ophthalmology;  Laterality: Right;  CDE: 3.21   CATARACT EXTRACTION W/PHACO Left 04/14/2019   Procedure: CATARACT EXTRACTION PHACO AND INTRAOCULAR LENS  PLACEMENT (IOC);  Surgeon: Fabio Pierce, MD;  Location: [facility];  Service: Ophthalmology;  Laterality: Left;  left - pt knows to arrive at 7:45, CDE: 3.55   COLONOSCOPY  11/24/2004   Polyps in the left colon ablated/removed as described above.  Two submucosal lesions consistent with lipomas as described above not  manipulated./ Normal rectum   COLONOSCOPY  06/20/2012   MULTIPLE RECTAL AND COLONIC POLYPS   COLONOSCOPY N/A 03/08/2015   RMR: Capacious, redundant colon. Multiple colonic and rectosigmoid polyps removed. ablated as described above. colonic lipoma abnormal appearing terminal ileum likely a variant of normal). Howeverwith history  biopsies obtained.    ESOPHAGOGASTRODUODENOSCOPY N/A 03/08/2015   RMR: Hiatal hernia Polypoid gastric mucosa with multiple gastric polyps. largest polyp removed via snare polypectomgy hemostasis clip placed at base. Status post gastric biopsy. Status post video capsule placement.    ESOPHAGOGASTRODUODENOSCOPY (EGD) WITH PROPOFOL N/A 07/02/2023   Procedure: ESOPHAGOGASTRODUODENOSCOPY (EGD) WITH PROPOFOL;  Surgeon: Franky Macho, MD;  [facility];  Service: Endoscopy;  Laterality: N/A;   ESOPHAGOGASTRODUODENOSCOPY (EGD) WITH PROPOFOL N/A 07/20/2023   Procedure: ESOPHAGOGASTRODUODENOSCOPY (EGD) WITH PROPOFOL;  Surgeon: Franky Macho, MD;  [facility];  Service: Endoscopy;  Laterality: N/A;   GASTROJEJUNOSTOMY N/A 07/07/2023   Procedure: GASTROJEJUNOSTOMY;  Surgeon: Lewie Chamber, DO;  Location:  [facility];  Service: General;  Laterality: N/A;   GIVENS CAPSULE STUDY N/A 03/08/2015   Procedure: GIVENS CAPSULE STUDY;  Surgeon: Corbin Ade, MD;  [facility];  Service: Endoscopy;  Laterality: N/A;   GOLD SEED IMPLANT N/A 09/08/2022   Procedure: GOLD SEED IMPLANT;  Surgeon: Malen Gauze, MD;  Location: [facility];  Service: Urology;  Laterality: N/A;   HEMOSTASIS CONTROL  07/20/2023   Procedure: HEMOSTASIS CONTROL;   Surgeon: Franky Macho, MD;  [facility];  Service: Endoscopy;;   LIMBAL STEM CELL TRANSPLANT     LUNG BIOPSY  6/06   MULTIPLE TOOTH EXTRACTIONS  04/2005   port a cath placement     PORT-A-CATH REMOVAL  09/08/2012   Procedure: REMOVAL PORT-A-CATH;  Surgeon: Loreli Slot, MD;  [facility];  Service: Thoracic;  Laterality: N/A;   SPACE OAR INSTILLATION N/A 09/08/2022   Procedure: SPACE OAR INSTILLATION;  Surgeon: Malen Gauze, MD;  Location: [facility];  Service: Urology;  Laterality: N/A;     Home Medications:  Prior to Admission medications   Medication Sig Start Date End Date Taking? Authorizing Provider  apixaban (ELIQUIS) 5 MG TABS tablet Take 1 tablet (5 mg total) by mouth 2 (two) times daily. 01/24/18  Yes Laqueta Linden, MD  bisacodyl (DULCOLAX) 10 MG suppository Place 1 suppository (10 mg total) rectally daily as needed for moderate constipation. 07/15/23  Yes Vassie Loll, MD  feeding supplement (ENSURE ENLIVE / ENSURE PLUS) LIQD Take 237 mLs by mouth 3 (three) times daily between meals. Patient taking differently: Take 237 mLs by mouth 3 (three) times daily between meals. Recorded as "House Supplement" on [facility] 07/15/23  Yes Vassie Loll, MD  furosemide (LASIX) 20 MG tablet Take 1 tablet (20 mg total) by mouth daily as needed for fluid or edema. 07/30/22  Yes Strader, [place] M, PA-C  metoprolol succinate (TOPROL XL) 25 MG 24 hr tablet Take 1 tablet (25 mg total) by mouth daily. Take with or immediately following a meal. 07/15/23 07/14/24 Yes Vassie Loll, MD  oxyCODONE (OXY IR/ROXICODONE) 5 MG immediate release tablet Take 1 tablet (5 mg total) by mouth every 6 (six) hours as needed for severe pain (pain score 7-10). 07/15/23  Yes Vassie Loll, MD  polyethylene glycol (MIRALAX / GLYCOLAX) 17 g packet Take 17 g by mouth daily. 07/16/23  Yes Vassie Loll, MD  potassium chloride (KLOR-CON) 10 MEQ tablet Take 10 meq on days you need to take  Lasix Patient taking differently: Take 10 mEq by mouth daily as needed (when taking Lasix). Take 10 meq on days you need to take Lasix 08/07/22  Yes Strader, [place] M, PA-C  prochlorperazine (COMPAZINE) 10 MG tablet Take 1 tablet (10 mg total) by mouth every 6 (six) hours as needed for nausea or vomiting. 07/15/23  Yes Vassie Loll, MD  rosuvastatin (CRESTOR) 40 MG tablet Take 40 mg by mouth daily. 05/04/19  Yes [provider]  simethicone (MYLICON) 80 MG chewable tablet Chew 1 tablet (80 mg total) by mouth every 6 (six) hours as needed for flatulence (obstipation). 07/15/23  Yes Vassie Loll, MD  torsemide (DEMADEX) 20 MG tablet Take 20 mg by mouth daily. 07/17/23  Yes [provider]  Vitamin D, Ergocalciferol, (DRISDOL) 1.25 MG (50000 UNIT) CAPS capsule TAKE 1 CAPSULE BY MOUTH ONCE WEEKLY 08/18/22  Yes Doreatha Massed, MD  Blood Glucose Monitoring Suppl (ONE TOUCH ULTRA 2) w/Device KIT USE TO TEST BLOOD SUGAR ONCE D  UTD 05/25/19   [provider]  Lancets [facility] DELICA PLUS LANCET33G) MISC USE TO TEST TWICE DAILY 11/24/22   Dani Gobble, NP  [facility] ULTRA test strip TEST TWICE DAILY 11/22/20   Roma Kayser, MD    Inpatient Medications: Scheduled Meds:  Chlorhexidine Gluconate Cloth  6 each Topical Daily   docusate  100 mg Per Tube BID   insulin aspart  0-15 Units Subcutaneous Q4H   pantoprazole (PROTONIX) IV  40 mg Intravenous Q12H   Continuous Infusions:  PRN Meds: acetaminophen **OR** acetaminophen, ipratropium-albuterol, ondansetron **OR** ondansetron (ZOFRAN) IV, mouth rinse  Allergies:    Allergies  Allergen Reactions   Penicillins Rash    Social History:   Social History   Socioeconomic History   Marital status: Married    Spouse name: Not on file   Number of children: 2   Years of education: Not on file   Highest education level: Not on file  Occupational History   Occupation: disability due to back    Employer:  UNEMPLOYED  Tobacco Use   Smoking status: Former    Current packs/day: 0.00    Average packs/day: 0.5 packs/day for 15.0 years (7.5 ttl pk-yrs)    Types: Cigarettes    Start date: 11/11/1987    Quit date: 11/11/2002    Years since quitting: 20.7   Smokeless tobacco: Never  Vaping Use   Vaping status: Never Used  Substance and Sexual Activity   Alcohol use: No    Alcohol/week: 0.0 standard drinks of alcohol   Drug use: No   Sexual activity: Yes    Birth control/protection: None  Other Topics Concern   Not on file  Social History Narrative   Married   No regular exercise   Social Determinants of Health   Financial Resource Strain: Low Risk  (06/28/2020)   Overall Financial Resource Strain (CARDIA)    Difficulty of Paying Living Expenses: Not hard at all  Food Insecurity: No Food Insecurity (07/19/2023)   Hunger Vital Sign    Worried About Running Out of Food in the Last Year: Never true    Ran Out of Food in the Last Year: Never true  Transportation Needs: No Transportation Needs (07/19/2023)   PRAPARE - Administrator, Civil Service (Medical): No    Lack of Transportation (Non-Medical): No  Physical Activity: Inactive (06/28/2020)   Exercise Vital Sign    Days of Exercise per Week: 0 days    Minutes of Exercise per Session: 0 min  Stress: No Stress Concern Present (06/28/2020)   [employer] of Occupational Health - Occupational Stress Questionnaire    Feeling of Stress : Not at all  Social Connections: Moderately Integrated (06/28/2020)   Social Connection and Isolation Panel [NHANES]    Frequency of Communication with Friends and Family: More than three times a week    Frequency of Social Gatherings with Friends and Family: Twice a week    Attends Religious Services: More than 4 times per year    Active Member of [employer] or Organizations: No    Attends Banker Meetings: Never    Marital Status: Married  Catering manager Violence: Not At Risk  (07/19/2023)   Humiliation, Afraid, Rape, and Kick questionnaire    Fear of Current or Ex-Partner: No    Emotionally Abused: No    Physically Abused: No    Sexually Abused: No    Family History:   Family History  Problem Relation Age of  Onset   Cancer Mother        lung   Hypertension Mother    Hyperlipidemia Mother    Cancer Father        prostate   Hypertension Father    Colon cancer Neg Hx    Diabetes Neg Hx      ROS:  Please see the history of present illness.  Review of Systems  Unable to perform ROS: Mental status change (confusion after V.Fib arrest)   Physical Exam/Data:   Vitals:   07/22/23 1406 07/22/23 2006 07/23/23 0000 07/23/23 0400  BP: (!) 128/90 106/70 (!) 112/57 109/61  Pulse: (!) 104 93 (!) 102 95  Resp: (!) 24 (!) 21 (!) 21 (!) 21  Temp: 97.9 F (36.6 C) 99.1 F (37.3 C) 99.1 F (37.3 C) 99.3 F (37.4 C)  TempSrc: Oral Oral Axillary Axillary  SpO2:  96% 95% 94%  Weight:      Height:        Intake/Output Summary (Last 24 hours) at 07/23/2023 0737 Last data filed at 07/23/2023 0550 Gross per 24 hour  Intake 99.97 ml  Output 1300 ml  Net -1200.03 ml      07/21/2023    4:45 AM 07/20/2023    6:00 PM 07/19/2023    1:30 PM  Last 3 Weights  Weight (lbs) 201 lb 4.5 oz 194 lb 3.6 oz 199 lb 8.3 oz  Weight (kg) 91.3 kg 88.1 kg 90.5 kg     Body mass index is 28.07 kg/m.  General: 72 y.o. ill appearing African-American  male resting comfortably in no acute distress. HEENT: Normocephalic and atraumatic. Sclera clear.  Neck: Supple. Marland Kitchen No JVD. Heart: Tachycardic with irregularly irregular rhythm.  No murmurs, gallops, or rubs.  Lungs: No increased work of breathing. Decreased breaths sounds in bilateral bases but no wheezes, rhonchi, or rales appreciated. Abdomen: Soft, non-distended, and non-tender to palpation.  Extremities: Mild edema of bilateral upper extremities. No significant lower extremity edema. SCDs in place.  Skin: Warm and  dry. Neuro: Alert and oriented to name, birthday, and place. No focal deficits. Psych: Normal affect.   EKG:  The EKG was personally reviewed and demonstrates:  Initial EKG on 07/20/2023 showed a wide complex tachycardia, rate 117 bpm (suspect atrial flutter with LBBB). Repeat EKG on 07/20/2023 showed atrial flutter, rate 72 bpm, with LBBB. Telemetry:  Telemetry was personally reviewed and demonstrates:  Atrial flutter with rates mostly in the 90s to low100s.   Relevant CV Studies:  Echocardiogram 07/21/2023: Impressions: 1. Left ventricular ejection fraction, by estimation, is 30 to 35%. The left ventricle has moderately decreased function. The left ventricle  demonstrates global hypokinesis. Left ventricular diastolic function could not be evaluated.   2. Right ventricular systolic function is moderately reduced. The right ventricular size is normal. There is moderately elevated pulmonary artery systolic pressure. The estimated right ventricular systolic pressure is 53.9 mmHg.   3. Left atrial size was severely dilated.   4. The mitral valve is grossly normal. Moderate mitral valve regurgitation.   5. The aortic valve is tricuspid. Aortic valve regurgitation is not visualized. No aortic stenosis is present.   6. The inferior vena cava is dilated in size with <50% respiratory variability, suggesting right atrial pressure of 15 mmHg.   Comparison(s): Prior images reviewed side by side. The left ventricular function is significantly worse. The right ventricular systolic function  is significantly worse.   Laboratory Data:  High Sensitivity Troponin:   Recent Labs  Lab 07/20/23 1530 07/21/23 1201  TROPONINIHS 33* 67*     Chemistry Recent Labs  Lab 07/21/23 0057 07/22/23 0418 07/23/23 0411  NA 142 142 145  K 3.7 4.7 4.3  CL 111 110 111  CO2 21* 25 27  GLUCOSE 203* 99 93  BUN 20 27* 33*  CREATININE 1.63* 1.88* 1.96*  CALCIUM 8.3* 8.3* 8.0*  MG 1.4* 2.8* 2.9*  GFRNONAA 45*  38* 36*  ANIONGAP 10 7 7     Recent Labs  Lab 07/20/23 1507 07/22/23 0418  PROT 5.6*  --   ALBUMIN 2.8* 1.8*  AST 17  --   ALT 13  --   ALKPHOS 52  --   BILITOT 1.1  --    Lipids No results for input(s): "CHOL", "TRIG", "HDL", "LABVLDL", "LDLCALC", "CHOLHDL" in the last 168 hours.  Hematology Recent Labs  Lab 07/20/23 2218 07/21/23 0057 07/21/23 1409 07/23/23 0411  WBC 6.7 7.9  --  5.3  RBC 2.82* 2.64*  --  2.64*  HGB 8.3* 7.7* 9.0* 7.4*  HCT 25.7* 23.9* 27.9* 24.5*  MCV 91.1 90.5  --  92.8  MCH 29.4 29.2  --  28.0  MCHC 32.3 32.2  --  30.2  RDW 17.2* 17.0*  --  18.4*  PLT 204 172  --  107*   Thyroid No results for input(s): "TSH", "FREET4" in the last 168 hours.  BNP Recent Labs  Lab 07/20/23 2218  BNP 388.1*    DDimer No results for input(s): "DDIMER" in the last 168 hours.   Radiology/Studies:  ECHOCARDIOGRAM COMPLETE  Result Date: 07/21/2023    ECHOCARDIOGRAM REPORT   Patient Name:   [name]. Date of Exam: 07/21/2023 Medical Rec #:  [id number]        Height:       71.0 in Accession #:    [id number]       Weight:       201.3 lb Date of Birth:  06-01-1951        BSA:          2.114 m Patient Age:    72 years         BP:           112/26 mmHg Patient Gender: M                HR:           40 bpm. Exam Location:  Inpatient Procedure: 2D Echo, Cardiac Doppler, Color Doppler and Intracardiac            Opacification Agent Indications:    Cardiac arrest I46.9  History:        Patient has prior history of Echocardiogram examinations, most                 recent 08/06/2022. Arrythmias:Atrial Flutter; Risk                 Factors:Hypertension, Diabetes and Dyslipidemia.  Sonographer:    Lucendia Herrlich RCS Referring Phys: [id number] RAVI AGARWALA IMPRESSIONS  1. Left ventricular ejection fraction, by estimation, is 30 to 35%. The left ventricle has moderately decreased function. The left ventricle demonstrates global hypokinesis. Left ventricular diastolic function could  not be evaluated.  2. Right ventricular systolic function is moderately reduced. The right ventricular size is normal. There is moderately elevated pulmonary artery systolic pressure. The estimated right ventricular systolic pressure is 53.9 mmHg.  3. Left atrial size was severely dilated.  4. The  mitral valve is grossly normal. Moderate mitral valve regurgitation.  5. The aortic valve is tricuspid. Aortic valve regurgitation is not visualized. No aortic stenosis is present.  6. The inferior vena cava is dilated in size with <50% respiratory variability, suggesting right atrial pressure of 15 mmHg. Comparison(s): Prior images reviewed side by side. The left ventricular function is significantly worse. The right ventricular systolic function is significantly worse. FINDINGS  Left Ventricle: There is no left ventricular thrombus (Definity contrast was used). There is profound systolic dyssynchrony due to abnormal electrical activation (LBBB versus idioventricular rhythm). Left ventricular ejection fraction, by estimation, is  30 to 35%. The left ventricle has moderately decreased function. The left ventricle demonstrates global hypokinesis. Definity contrast agent was given IV to delineate the left ventricular endocardial borders. The left ventricular internal cavity size was normal in size. There is no left ventricular hypertrophy. Left ventricular diastolic function could not be evaluated due to atrial fibrillation. Left ventricular diastolic function could not be evaluated. Right Ventricle: The right ventricular size is normal. No increase in right ventricular wall thickness. Right ventricular systolic function is moderately reduced. There is moderately elevated pulmonary artery systolic pressure. The tricuspid regurgitant velocity is 3.12 m/s, and with an assumed right atrial pressure of 15 mmHg, the estimated right ventricular systolic pressure is 53.9 mmHg. Left Atrium: Left atrial size was severely dilated.  Right Atrium: Right atrial size was normal in size. Pericardium: There is no evidence of pericardial effusion. Mitral Valve: The mitral valve is grossly normal. Mild mitral annular calcification. Moderate mitral valve regurgitation. Tricuspid Valve: The tricuspid valve is normal in structure. Tricuspid valve regurgitation is mild. Aortic Valve: The aortic valve is tricuspid. Aortic valve regurgitation is not visualized. No aortic stenosis is present. Aortic valve peak gradient measures 7.4 mmHg. Pulmonic Valve: The pulmonic valve was grossly normal. Pulmonic valve regurgitation is trivial. No evidence of pulmonic stenosis. Aorta: The aortic root and ascending aorta are structurally normal, with no evidence of dilitation. Venous: The inferior vena cava is dilated in size with less than 50% respiratory variability, suggesting right atrial pressure of 15 mmHg. IAS/Shunts: The interatrial septum was not well visualized.  LEFT VENTRICLE PLAX 2D LVIDd:         4.00 cm   Diastology LVIDs:         2.70 cm   LV e' lateral:   8.94 cm/s LV PW:         1.10 cm   LV E/e' lateral: 11.3 LV IVS:        1.10 cm LVOT diam:     2.30 cm LV SV:         72 LV SV Index:   34 LVOT Area:     4.15 cm  RIGHT VENTRICLE         IVC TAPSE (M-mode): 0.9 cm  IVC diam: 2.60 cm LEFT ATRIUM              Index        RIGHT ATRIUM           Index LA diam:        5.30 cm  2.51 cm/m   RA Area:     12.50 cm LA Vol (A2C):   119.0 ml 56.28 ml/m  RA Volume:   23.40 ml  11.07 ml/m LA Vol (A4C):   105.0 ml 49.66 ml/m LA Biplane Vol: 117.0 ml 55.34 ml/m  AORTIC VALVE  PULMONIC VALVE AV Area (Vmax): 3.24 cm     PR End Diast Vel: 14.29 msec AV Vmax:        136.00 cm/s AV Peak Grad:   7.4 mmHg LVOT Vmax:      106.00 cm/s LVOT Vmean:     59.533 cm/s LVOT VTI:       0.174 m  AORTA Ao Root diam: 3.40 cm Ao Asc diam:  3.70 cm MITRAL VALVE                TRICUSPID VALVE MV Area (PHT): 3.49 cm     TR Peak grad:   38.9 mmHg MV Decel Time: 217 msec      TR Vmax:        312.00 cm/s MR Peak grad: 95.6 mmHg MR Vmax:      489.00 cm/s   SHUNTS MV E velocity: 100.77 cm/s  Systemic VTI:  0.17 m MV A velocity: 19.65 cm/s   Systemic Diam: 2.30 cm MV E/A ratio:  5.13 Mihai Croitoru MD Electronically signed by Thurmon Fair MD Signature Date/Time: 07/21/2023/3:49:10 PM    Final    DG CHEST PORT 1 VIEW  Result Date: 07/20/2023 CLINICAL DATA:  Intubated, asthma EXAM: PORTABLE CHEST 1 VIEW COMPARISON:  07/20/2023 FINDINGS: Single frontal view of the chest demonstrates endotracheal tube overlying tracheal air column, tip midway between thoracic inlet and carina. No change in position of the right internal jugular catheter, tip overlying the central mediastinum. Please correlate with catheter function, as intra arterial positioning cannot be excluded. Cardiac silhouette is unremarkable. Increased central vascular congestion and perihilar airspace disease consistent with worsening volume status. Trace bilateral pleural effusions. No pneumothorax. Postsurgical changes within the thoracic spine again noted. IMPRESSION: 1. Indeterminate positioning of the right internal jugular catheter, tip overlying the central mediastinum, unchanged since earlier exam. Intra arterial positioning of the catheter cannot be excluded, and correlation with catheter function is recommended. 2. Increasing vascular congestion and perihilar airspace disease, with trace bilateral pleural effusions, consistent with worsening volume status and pulmonary edema. Electronically Signed   By: Sharlet Salina M.D.   On: 07/20/2023 19:42   DG Chest Port 1 View  Result Date: 07/20/2023 CLINICAL DATA:  Cardiac arrest EXAM: PORTABLE CHEST 1 VIEW COMPARISON:  07/07/2022 FINDINGS: Endotracheal tube tip about 4.2 cm superior to the carina. Right neck central catheter, indeterminate position of tip which projects over the tracheal column. There are small bilateral effusions. Upper normal cardiac size with  aortic atherosclerosis. No pneumothorax IMPRESSION: 1. Endotracheal tube tip about 4.2 cm superior to the carina. 2. Right neck central catheter, indeterminate position of tip which projects over the tracheal column (?azygous? arterial). Repositioning is recommended 3. Small bilateral effusions. These results will be called to the ordering clinician or representative by the Radiologist Assistant, and communication documented in the PACS or [employer]. Electronically Signed   By: Jasmine Pang M.D.   On: 07/20/2023 17:18     Assessment and Plan:   Ventricular Fibrillation Arrest Patient was recently admitted earlier this month for gastric outlet obstruction secondary to newly gastric adenocarcinoma. He was readmitted on 07/19/2023 for acute anemia with hemoglobin as low as 5.4. He underwent EGD on 11/26 which showed a bleeding gastric mass in the antrum. Hemostasis was achieved with hemo-spray. Upon withdrawal of the scope, patient went into ventricular fibrillation. CPR was started immediately with ACLS protocol. ROSC was obtained after 2 minutes of CPR and 1 dose of Epinephrine. Post-ROSC EKG showed atrial flutter  with LBBB. High-sensitivity troponin 33.  Echo showed LVEF of 30-35% (down from 55-60% in 07/2022) with global hypokinesis. - Occasional PVCs noted on telemetry but no recurrent sustained ventricular arrhythmias. - Potassium as low as 3.6 this admission. Repleted and 4.3 today. - Magnesium as low as 1.4 this admission. Repleted and 2.9 today. - Can restart home Toprol-XL 25mg  when able to take PO medications. - Etiology possible due to hypovolemia. However, cannot rule out ischemia. He does have known coronary artery calcifications noted on chest CT from 2020 and a new LBBB noted on EKG earlier this month. However, he is not currently a good candidate for cardiac catheterization given other severe comorbidities including  ongoing anemia (hemoglobin back down to 7.4 today), recently  diagnosed gastric outlet syndrome secondary to gastric adenocarcinoma, renal function, and significant deconditioning.  Chronic HFrEF with Newly Reduced LV Function Echo this admission after V.Fib arrest showed LVEF of 30-35% (down from 55-60% in 07/2022) with global hypokinesis, moderately reduced RV function with moderately elevated PASP, severe left atrial enlargement, and moderate MR. BNP 388. Chest x-ray on 11/26 showed increasing vascular congestion and perihilar airspace disease with trace pleural effusions consistent with worsening volume status and pulmonary edema. He was given 1 dose of IV Lasix on 11/28.  - He has some mild upper extremity edema but otherwise does not appear significantly volume overloaded. - No need for additional IV Lasix right now. - GDMT limited by renal function. BP also soft but stable. No ARB/ ARNI or MRA right now given renal function. Can restart home Toprol-XL 25mg  daily when able to take PO medications.  - Continue to monitor daily weights, strict I/Os, and renal function.  Persistent Atrial Flutter Rates reasonably well controlled in the 90s to low 100s. - Can restart home Toprol-XL 25mg  daily when able to take PO medications. - On chronic anticoagulation with Eliquis at home. Currently on hold in setting of acute anemia secondary to upper GI bleed from gastric tumor. Will defer when it is safe to restart this to GI.  Left Cephalic Vein Thrombosis Diagnosed during recent admission earlier this month.  - Management per primary team.  AKI Creatinine was 1.12 on admission but has slowly been increasing. Creatinine up to 1.96 today. Likely secondary to hypovolemic shock and V.Fib Arrest. - Continue to monitor closely.   Otherwise, per primary team: - Acute hypoxic respiratory failure initially requiring intubation (extubated 11/27) - Possible aspiration pneumonitis  - Acute on chronic anemia/ upper GI bleed - Gastric outlet obstruction secondary to  gastric adenocarcinoma - Type 2 diabetes mellitus - Hyperlipidemia - Severe deconditioning   Risk Assessment/Risk Scores:    New York Heart Association (NYHA) Functional Class Difficult to assess functional status given confusion but suspect functional status is mainly limited by generalized weakness/ deconditioning rather than CHF  CHA2DS2-VASc Score = 5  This indicates a 7.2% annual risk of stroke. The patient's score is based upon: CHF History: 1 HTN History: 1 Diabetes History: 1 Stroke History: 0 Vascular Disease History: 1 Age Score: 1 Gender Score: 0  For questions or updates, please contact [facility] Please consult [email or web address] for contact info under   Signed, Smitty Knudsen  07/23/2023 7:37 AM  Patient seen and examined, note reviewed with the signed Advanced Practice Provider. I personally reviewed laboratory data, imaging studies and relevant notes. I independently examined the patient and formulated the important aspects of the plan. I have personally discussed the plan with the patient and/or family.  Comments or changes to the note/plan are indicated below.  Hx of recent Vfib arrest Chronic heart failure with reduced ejection fraction  Persistent atrial flutter  Acute kidney injury - cr appears to be worsening   This is an unfortunate situation with his newly depressed ejection fraction - etiology could be multifactorial including ischemic disease. The problem here is his current clinical picture - with ongoing acute blood loss anemia,worsening renal function he is not an ideal candidate for cardiac catheterization.   With recent bleeding his home Eliquis due to recent bleeding.   He will require GDMT - start Toprol xl , currently limited by his cr.   Cont current dose of statin.    Thomasene Ripple DO, MS [facility] Attending Cardiologist [facility]  [address] [place], [place] [address] [phone] Website:  [email or web address]

## 2023-07-23 NOTE — Progress Notes (Addendum)
PROGRESS NOTE        PATIENT DETAILS Name: [name]. Age: 72 y.o. Sex: male Date of Birth: 31-Dec-1950 Admit Date: 07/19/2023 Admitting Physician Ejiroghene Wendall Stade, MD YNW:GNFAO, Lyman Bishop, MD  Brief Summary: Patient is a 72 y.o.  male who was recently diagnosed with gastric outlet obstruction  secondary to gastric adenocarcinoma-s/p palliative gastrojejunostomy on 11/13-presented to the hospital with upper GI bleeding in the setting of Eliquis for A-fib.  Initially admitted at [facility] he was stabilized-underwent EGD-which was complicated by V-fib arrest.  Patient was intubated and subsequently transferred to ICU here at [facility].  Echocardiogram showed new systolic dysfunction-patient was stabilized-extubated and subsequently transferred to [facility] on 11/29.  See below for further details.  Significant events: 11/25>> admit to [facility]-upper GI bleed-acute blood loss anemia 11/26>> EGD-active bleeding-successful hemostasis-but had V-fib arrest-intubated-transferred to ICU [facility]. 11/27>> extubated 11/29>> transferred to [facility].  Significant studies: 11/27>> EF 30-35%, RV systolic function moderately reduced.  RVSP 53.9.  Significant microbiology data: None  Procedures: 11/26>> ZHY:QMVHQ, fungating and ulcerated, circumferential mass with oozing bleeding and stigmata of recent bleeding was found in the gastric antrum.  Hemostatic spray applied. 11/26-11/27>> ETT  Consults: GI Palliative care PCCM Cardiology  Subjective: Somewhat confused but able to be reoriented at times.  No family at bedside.  Objective: Vitals: Blood pressure 124/69, pulse (!) 105, temperature 97.8 F (36.6 C), temperature source Oral, resp. rate 20, height 5\' 11"  (1.803 m), weight 91.3 kg, SpO2 99%.   Exam: Gen Exam:Alert awake-not in any distress HEENT:atraumatic, normocephalic Chest: B/L clear to auscultation anteriorly CVS:S1S2 regular Abdomen:soft non tender, non distended.   Staples in place. Extremities:no edema Neurology: Non focal-but with generalized weakness Skin: no rash  Pertinent Labs/Radiology:    Latest Ref Rng & Units 07/23/2023    4:11 AM 07/21/2023    2:09 PM 07/21/2023   12:57 AM  CBC  WBC 4.0 - 10.5 K/uL 5.3   7.9   Hemoglobin 13.0 - 17.0 g/dL 7.4  9.0  7.7   Hematocrit 39.0 - 52.0 % 24.5  27.9  23.9   Platelets 150 - 400 K/uL 107   172     Lab Results  Component Value Date   NA 145 07/23/2023   K 4.3 07/23/2023   CL 111 07/23/2023   CO2 27 07/23/2023     Assessment/Plan: V-fib arrest Occurred during EGD 11/26 Echo with new onset HFrEF and RV systolic dysfunction Obtaining cardiology opinion but poor candidate for aggressive care at this point Ongoing goals of care discussion-see below.  Combined distributive/hemorrhagic shock Required pressors briefly Resolved BP stable  Acute hypoxic respiratory failure in the setting of V-fib arrest Extubated 11/27 Currently on 2 L of oxygen Mobilize Pulmonary toileting  New diagnosis of HFrEF Volume status reasonable As needed IV Lasix based on daily exam-holding today due to bump in creatinine. Await cardiology opinion  Chronic atrial fibrillation Telemetry monitoring Rate well-controlled off rate control agents At this point-he is not candidate for any further anticoagulation-risks outweigh any benefits  Superficial venous thrombus-left cephalic vein Supportive care Not a candidate for anticoagulation  HTN BP soft but stable Watch closely.  AKI Likely hemodynamically mediated Hold Lasix today Renal ultrasound Keep Foley in place-voiding trial over the next several days once renal function improves further. Avoid nephrotoxic agents Repeat labs tomorrow morning.  Acute metabolic encephalopathy Multifactorial  etiology-from critical illness-ICU delirium/AKI Moving all 4 extremities-somewhat redirectable Maintain delirium precautions.  Upper GI bleeding with acute  blood loss anemia Secondary to bleeding gastric malignancy-s/p EGD with hemostatic spray 11/26 Required 3 units of PRBC so far-last transfused 11/27 Hb levels fluctuating-but no overt bleeding noted Follow CBC closely and transfuse if significant drop/or evidence of overt bleeding Continue PPI twice daily  Recent diagnosis of gastric outlet obstruction secondary to gastric adenocarcinoma-s/p palliative gastrojejunostomy on 11/13-Dr. Pappayliou at [facility] Follow with Dr. Frances Maywood at [facility] Palliative care discussions ongoing-poor overall prognosis. Epic message sent to surgery at [facility] to see if we could discontinue his staples.  Addendum Discussed with Dr. Mikki Harbor to remove staples today  DM-2 (A1c 4.6 on 10/23) CBG stable on SSI  Recent Labs    07/23/23 0040 07/23/23 0605 07/23/23 0938  GLUCAP 86 83 83    Palliative care Poor overall prognosis Full code for now Discussed with daughter/spouse at bedside-they understand tenuous situation-poor overall prognosis. Palliative care following-await further discussions with family.  Nutrition Status: Nutrition Problem: Moderate Malnutrition Etiology: acute illness (recently diagnosed gastric cancer) Signs/Symptoms: mild muscle depletion, mild fat depletion Interventions: Ensure Enlive (each supplement provides 350kcal and 20 grams of protein), MVI  Pressure Ulcer: Agree with assessment and plan as outlined below Pressure Injury 07/20/23 Buttocks Left Stage 2 -  Partial thickness loss of dermis presenting as a shallow open injury with a red, pink wound bed without slough. (Active)  07/20/23 1845  Location: Buttocks  Location Orientation: Left  Staging: Stage 2 -  Partial thickness loss of dermis presenting as a shallow open injury with a red, pink wound bed without slough.  Wound Description (Comments):   Present on Admission: Yes  Dressing Type Foam - Lift dressing to assess site every shift 07/22/23 2006     BMI: Estimated body mass index is 28.07 kg/m as calculated from the following:   Height as of this encounter: 5\' 11"  (1.803 m).   Weight as of this encounter: 91.3 kg.   Code status:   Code Status: Full Code   DVT Prophylaxis: SCDs Start: 07/19/23 1926   Family Communication: Spouse/daughter at bedside   Disposition Plan: Status is: Inpatient Remains inpatient appropriate because: Severity of illness   Planned Discharge Destination:Skilled nursing facility   Diet: Diet Order             Diet clear liquid Room service appropriate? No; Fluid consistency: Thin  Diet effective now                     Antimicrobial agents: Anti-infectives (From admission, onward)    None        MEDICATIONS: Scheduled Meds:  Chlorhexidine Gluconate Cloth  6 each Topical Daily   docusate  100 mg Per Tube BID   insulin aspart  0-15 Units Subcutaneous Q4H   pantoprazole (PROTONIX) IV  40 mg Intravenous Q12H   Continuous Infusions: PRN Meds:.acetaminophen **OR** acetaminophen, ipratropium-albuterol, ondansetron **OR** ondansetron (ZOFRAN) IV, mouth rinse   I have personally reviewed following labs and imaging studies  LABORATORY DATA: CBC: Recent Labs  Lab 07/19/23 1338 07/20/23 0214 07/20/23 0414 07/20/23 1507 07/20/23 1840 07/20/23 2218 07/21/23 0057 07/21/23 1409 07/23/23 0411  WBC 6.4  --  6.2 8.5  --  6.7 7.9  --  5.3  NEUTROABS 5.2  --   --  5.0  --  5.8  --   --  4.6  HGB 6.2*   < > 8.1*  9.9* 8.8* 8.3* 7.7* 9.0* 7.4*  HCT 19.9*   < > 26.2* 31.1* 26.0* 25.7* 23.9* 27.9* 24.5*  MCV 101.5*  --  96.0 97.2  --  91.1 90.5  --  92.8  PLT 234  --  195 217  --  204 172  --  107*   < > = values in this interval not displayed.    Basic Metabolic Panel: Recent Labs  Lab 07/20/23 0414 07/20/23 1507 07/20/23 1840 07/21/23 0057 07/22/23 0418 07/23/23 0411  NA 143 142 143 142 142 145  K 4.0 3.7 3.6 3.7 4.7 4.3  CL 106 110  --  111 110 111  CO2 27 25  --   21* 25 27  GLUCOSE 93 92  --  203* 99 93  BUN 21 19  --  20 27* 33*  CREATININE 1.34* 1.41*  --  1.63* 1.88* 1.96*  CALCIUM 8.3* 8.2*  --  8.3* 8.3* 8.0*  MG  --   --   --  1.4* 2.8* 2.9*  PHOS  --   --   --   --  4.1 4.2    GFR: Estimated Creatinine Clearance: 39.9 mL/min (A) (by C-G formula based on SCr of 1.96 mg/dL (H)).  Liver Function Tests: Recent Labs  Lab 07/20/23 1507 07/22/23 0418  AST 17  --   ALT 13  --   ALKPHOS 52  --   BILITOT 1.1  --   PROT 5.6*  --   ALBUMIN 2.8* 1.8*   No results for input(s): "LIPASE", "AMYLASE" in the last 168 hours. No results for input(s): "AMMONIA" in the last 168 hours.  Coagulation Profile: No results for input(s): "INR", "PROTIME" in the last 168 hours.  Cardiac Enzymes: Recent Labs  Lab 07/20/23 1507  CKTOTAL 35*  CKMB 1.4    BNP (last 3 results) No results for input(s): "PROBNP" in the last 8760 hours.  Lipid Profile: No results for input(s): "CHOL", "HDL", "LDLCALC", "TRIG", "CHOLHDL", "LDLDIRECT" in the last 72 hours.  Thyroid Function Tests: No results for input(s): "TSH", "T4TOTAL", "FREET4", "T3FREE", "THYROIDAB" in the last 72 hours.  Anemia Panel: No results for input(s): "VITAMINB12", "FOLATE", "FERRITIN", "TIBC", "IRON", "RETICCTPCT" in the last 72 hours.  Urine analysis:    Component Value Date/Time   COLORURINE YELLOW 07/09/2023 1840   APPEARANCEUR HAZY (A) 07/09/2023 1840   APPEARANCEUR Clear 05/03/2023 1056   LABSPEC 1.020 07/09/2023 1840   PHURINE 5.0 07/09/2023 1840   GLUCOSEU NEGATIVE 07/09/2023 1840   HGBUR SMALL (A) 07/09/2023 1840   BILIRUBINUR NEGATIVE 07/09/2023 1840   BILIRUBINUR Negative 05/03/2023 1056   KETONESUR 5 (A) 07/09/2023 1840   PROTEINUR 100 (A) 07/09/2023 1840   NITRITE NEGATIVE 07/09/2023 1840   LEUKOCYTESUR NEGATIVE 07/09/2023 1840    Sepsis Labs: Lactic Acid, Venous    Component Value Date/Time   LATICACIDVEN 1.0 09/06/2017 2047    MICROBIOLOGY: Recent  Results (from the past 240 hour(s))  MRSA Next Gen by PCR, Nasal     Status: None   Collection Time: 07/20/23  6:07 PM   Specimen: Nasal Mucosa; Nasal Swab  Result Value Ref Range Status   MRSA by PCR Next Gen NOT DETECTED NOT DETECTED Final    Comment: (NOTE) The GeneXpert MRSA Assay (FDA approved for NASAL specimens only), is one component of a comprehensive MRSA colonization surveillance program. It is not intended to diagnose MRSA infection nor to guide or monitor treatment for MRSA infections. Test performance is not FDA approved  in patients less than 8 years old. Performed at [facility], [address] [place], [place] [address]     RADIOLOGY STUDIES/RESULTS: No results found.   LOS: 4 days   Jeoffrey Massed, MD  [facility]    To contact the attending provider between 7A-7P or the covering provider during after hours 7P-7A, please log into the web site [email or web address] and access using universal [place] password for that web site. If you do not have the password, please call the hospital operator.  07/23/2023, 10:03 AM

## 2023-07-23 NOTE — Progress Notes (Addendum)
Nutrition Follow-up  DOCUMENTATION CODES:   Non-severe (moderate) malnutrition in context of acute illness/injury  INTERVENTION:   Diet advancement per MD, consider advancing to full liquids for more kcal and protein.  Boost Breeze po TID, each supplement provides 250 kcal and 9 grams of protein.  If ongoing full scope care continues, need to consider starting nutrition support to help meet nutrition needs. TPN vs Cortrak tube for TF.  NUTRITION DIAGNOSIS:   Moderate Malnutrition related to acute illness (recently diagnosed gastric cancer) as evidenced by mild muscle depletion, mild fat depletion.  Ongoing   GOAL:   Patient will meet greater than or equal to 90% of their needs  Unmet   MONITOR:   Diet advancement  REASON FOR ASSESSMENT:   Malnutrition Screening Tool    ASSESSMENT:   72 yo male admitted with GI bleed. PMH includes gastric adenocarcinoma s/p recent palliative gastrojejunostomy, HTN, large B cell lymphoma, DM, HLD, iron deficiency anemia.  Patient was extubated 11/27. Now on clear liquids. Intake is minimal, not meeting nutrition needs. Patient somewhat confused today during RD visit.  Noted overall poor prognosis. Palliative care team following, family wants to continue full scope treatment.  Labs and medications reviewed.   Diet Order:   Diet Order             Diet clear liquid Room service appropriate? No; Fluid consistency: Thin  Diet effective now                   EDUCATION NEEDS:   No education needs have been identified at this time  Skin:  Skin Assessment: Skin Integrity Issues: Skin Integrity Issues:: Stage II Stage II: L buttocks  Last BM:  11/28  Height:   Ht Readings from Last 1 Encounters:  07/20/23 5\' 11"  (1.803 m)    Weight:   Wt Readings from Last 1 Encounters:  07/21/23 91.3 kg    Ideal Body Weight:  78.2 kg  BMI:  Body mass index is 28.07 kg/m.  Estimated Nutritional Needs:   Kcal:   2250-2500  Protein:  125-145 gm  Fluid:  2.3-2.5 L   Gabriel Rainwater RD, LDN, CNSC Please refer to Amion for contact information.

## 2023-07-23 NOTE — Evaluation (Signed)
Physical Therapy Evaluation Patient Details Name: [name]. MRN: [id number] DOB: 1950-11-11 Today's Date: 07/23/2023  History of Present Illness  Patient is a 72 y.o.  male who was recently diagnosed with gastric outlet obstruction  secondary to gastric adenocarcinoma-s/p palliative gastrojejunostomy on 11/13-presented to the hospital with upper GI bleeding in the setting of Eliquis for A-fib.  Initially admitted at [facility] he was stabilized-underwent EGD-which was complicated by V-fib arrest.  Patient was intubated and subsequently transferred to ICU here at [facility]  Clinical Impression  Pt presents with admitting diagnosis above. Pt today required +2 total A to chair. Pt wife present and reports that he was recently DC to SNF for 3 days however never received formal PT eval there. Prior to previous hospitalization wife reports that pt was very weak however got around with a SPC. Patient will benefit from continued inpatient follow up therapy, <3 hours/day. PT will continue to follow.       If plan is discharge home, recommend the following: Two people to help with walking and/or transfers;A lot of help with bathing/dressing/bathroom;Assistance with cooking/housework;Direct supervision/assist for medications management;Assist for transportation;Help with stairs or ramp for entrance;Supervision due to cognitive status   Can travel by private vehicle   No    Equipment Recommendations Other (comment) (Per accepting facility)  Recommendations for Other Services       Functional Status Assessment Patient has had a recent decline in their functional status and demonstrates the ability to make significant improvements in function in a reasonable and predictable amount of time.     Precautions / Restrictions Precautions Precautions: Fall Restrictions Weight Bearing Restrictions: No      Mobility  Bed Mobility Overal bed mobility: Needs Assistance Bed Mobility: Supine to Sit      Supine to sit: +2 for physical assistance, Total assist     General bed mobility comments: Via helicopter    Transfers Overall transfer level: Needs assistance Equipment used: 2 person hand held assist Transfers: Sit to/from Stand, Bed to chair/wheelchair/BSC Sit to Stand: +2 physical assistance, Total assist Stand pivot transfers: +2 physical assistance, Total assist         General transfer comment: +2 total for all mobility    Ambulation/Gait               General Gait Details: Unable  Stairs            Wheelchair Mobility     Tilt Bed    Modified Rankin (Stroke Patients Only)       Balance Overall balance assessment: Needs assistance Sitting-balance support: Feet supported, No upper extremity supported Sitting balance-Leahy Scale: Zero Sitting balance - Comments: Poor trunk control Postural control: Posterior lean, Right lateral lean Standing balance support: Bilateral upper extremity supported Standing balance-Leahy Scale: Zero Standing balance comment: zero balance                             Pertinent Vitals/Pain Pain Assessment Pain Assessment: No/denies pain    Home Living Family/patient expects to be discharged to:: Skilled nursing facility                   Additional Comments: Pt wife present and states that he was at SNF for 3 days    Prior Function Prior Level of Function : Needs assist             Mobility Comments: Pt wife reports he hadnt been  up at SNF but was very weak and used SPC short household distances. ADLs Comments: Independent prior to previous admission.     Extremity/Trunk Assessment   Upper Extremity Assessment Upper Extremity Assessment: Generalized weakness    Lower Extremity Assessment Lower Extremity Assessment: Generalized weakness    Cervical / Trunk Assessment Cervical / Trunk Assessment: Kyphotic  Communication   Communication Communication: Difficulty communicating  thoughts/reduced clarity of speech Cueing Techniques: Verbal cues;Tactile cues  Cognition Arousal: Lethargic Behavior During Therapy: Flat affect Overall Cognitive Status: Difficult to assess                                 General Comments: Pt was A&Ox2 reporting that we are at [employer] and the month is December.        General Comments General comments (skin integrity, edema, etc.): HR elevated to 141 during transfer.    Exercises     Assessment/Plan    PT Assessment Patient needs continued PT services  PT Problem List Decreased strength;Decreased activity tolerance;Decreased balance;Decreased mobility       PT Treatment Interventions DME instruction;Gait training;Stair training;Functional mobility training;Therapeutic activities;Therapeutic exercise;Balance training;Patient/family education    PT Goals (Current goals can be found in the Care Plan section)  Acute Rehab PT Goals PT Goal Formulation: Patient unable to participate in goal setting Time For Goal Achievement: 08/06/23 Potential to Achieve Goals: Poor    Frequency Min 1X/week     Co-evaluation               AM-PAC PT "6 Clicks" Mobility  Outcome Measure Help needed turning from your back to your side while in a flat bed without using bedrails?: Total Help needed moving from lying on your back to sitting on the side of a flat bed without using bedrails?: Total Help needed moving to and from a bed to a chair (including a wheelchair)?: Total Help needed standing up from a chair using your arms (e.g., wheelchair or bedside chair)?: Total Help needed to walk in hospital room?: Total Help needed climbing 3-5 steps with a railing? : Total 6 Click Score: 6    End of Session Equipment Utilized During Treatment: Oxygen Activity Tolerance: Patient limited by lethargy;Patient limited by fatigue Patient left: in chair;with call bell/phone within reach;with chair alarm set;with family/visitor  present;with restraints reapplied Nurse Communication: Mobility status;Need for lift equipment PT Visit Diagnosis: Other abnormalities of gait and mobility (R26.89)    Time: 1191-4782 PT Time Calculation (min) (ACUTE ONLY): 17 min   Charges:   PT Evaluation $PT Eval High Complexity: 1 High   PT General Charges $$ ACUTE PT VISIT: 1 Visit         Shela Nevin, PT, DPT Acute Rehab Services [phone]   Gladys Damme 07/23/2023, 12:42 PM

## 2023-07-23 NOTE — TOC Progression Note (Signed)
Transition of Care (TOC) - Progression Note    Patient Details  Name: [name]. MRN: [id number] Date of Birth: 1951-06-19  Transition of Care [facility]) CM/SW Contact  Mearl Latin, LCSW Phone Number: 07/23/2023, 11:48 AM  Clinical Narrative:    CSW following for PT eval and medical stability to begin insurance authorization for [facility].    Expected Discharge Plan: Skilled Nursing Facility Barriers to Discharge: Continued Medical Work up  Expected Discharge Plan and Services In-house Referral: Clinical Social Work   Post Acute Care Choice: Skilled Nursing Facility Living arrangements for the past 2 months: Skilled Nursing Facility                                       Social Determinants of Health (SDOH) Interventions SDOH Screenings   Food Insecurity: No Food Insecurity (07/19/2023)  Housing: Low Risk  (07/19/2023)  Transportation Needs: No Transportation Needs (07/19/2023)  Utilities: Not At Risk (07/19/2023)  Alcohol Screen: Low Risk  (06/28/2020)  Depression (PHQ2-9): Low Risk  (05/01/2020)  Financial Resource Strain: Low Risk  (06/28/2020)  Physical Activity: Inactive (06/28/2020)  Social Connections: Moderately Integrated (06/28/2020)  Stress: No Stress Concern Present (06/28/2020)  Tobacco Use: Medium Risk (07/20/2023)    Readmission Risk Interventions    07/20/2023    7:47 AM  Readmission Risk Prevention Plan  Transportation Screening Complete  HRI or Home Care Consult Complete  Social Work Consult for Recovery Care Planning/Counseling Complete  Palliative Care Screening Not Applicable  Medication Review Oceanographer) Complete

## 2023-07-24 DIAGNOSIS — K311 Adult hypertrophic pyloric stenosis: Secondary | ICD-10-CM

## 2023-07-24 DIAGNOSIS — K254 Chronic or unspecified gastric ulcer with hemorrhage: Secondary | ICD-10-CM

## 2023-07-24 DIAGNOSIS — I469 Cardiac arrest, cause unspecified: Secondary | ICD-10-CM | POA: Diagnosis not present

## 2023-07-24 DIAGNOSIS — C169 Malignant neoplasm of stomach, unspecified: Secondary | ICD-10-CM | POA: Diagnosis not present

## 2023-07-24 LAB — COMPREHENSIVE METABOLIC PANEL
ALT: 10 U/L (ref 0–44)
AST: 13 U/L — ABNORMAL LOW (ref 15–41)
Albumin: 1.9 g/dL — ABNORMAL LOW (ref 3.5–5.0)
Alkaline Phosphatase: 50 U/L (ref 38–126)
Anion gap: 10 (ref 5–15)
BUN: 37 mg/dL — ABNORMAL HIGH (ref 8–23)
CO2: 27 mmol/L (ref 22–32)
Calcium: 8.4 mg/dL — ABNORMAL LOW (ref 8.9–10.3)
Chloride: 111 mmol/L (ref 98–111)
Creatinine, Ser: 2.24 mg/dL — ABNORMAL HIGH (ref 0.61–1.24)
GFR, Estimated: 31 mL/min — ABNORMAL LOW (ref >60)
Glucose, Bld: 101 mg/dL — ABNORMAL HIGH (ref 70–99)
Potassium: 4 mmol/L (ref 3.5–5.1)
Sodium: 148 mmol/L — ABNORMAL HIGH (ref 135–145)
Total Bilirubin: 1 mg/dL (ref <1.2)
Total Protein: 4.6 g/dL — ABNORMAL LOW (ref 6.5–8.1)

## 2023-07-24 LAB — CBC
HCT: 26.1 % — ABNORMAL LOW (ref 39.0–52.0)
Hemoglobin: 8 g/dL — ABNORMAL LOW (ref 13.0–17.0)
MCH: 28.6 pg (ref 26.0–34.0)
MCHC: 30.7 g/dL (ref 30.0–36.0)
MCV: 93.2 fL (ref 80.0–100.0)
Platelets: 100 10*3/uL — ABNORMAL LOW (ref 150–400)
RBC: 2.8 MIL/uL — ABNORMAL LOW (ref 4.22–5.81)
RDW: 17.9 % — ABNORMAL HIGH (ref 11.5–15.5)
WBC: 3.3 10*3/uL — ABNORMAL LOW (ref 4.0–10.5)
nRBC: 0 % (ref 0.0–0.2)

## 2023-07-24 LAB — TYPE AND SCREEN
ABO/RH(D): A POS
Antibody Screen: NEGATIVE
Unit division: 0
Unit division: 0
Unit division: 0

## 2023-07-24 LAB — BPAM RBC
Blood Product Expiration Date: 202412262359
Blood Product Expiration Date: 202412312359
Blood Product Expiration Date: 202501022359
ISSUE DATE / TIME: 202411270915
Unit Type and Rh: 5100
Unit Type and Rh: 5100
Unit Type and Rh: 6200

## 2023-07-24 LAB — GLUCOSE, CAPILLARY
Glucose-Capillary: 118 mg/dL — ABNORMAL HIGH (ref 70–99)
Glucose-Capillary: 68 mg/dL — ABNORMAL LOW (ref 70–99)
Glucose-Capillary: 89 mg/dL (ref 70–99)
Glucose-Capillary: 90 mg/dL (ref 70–99)
Glucose-Capillary: 90 mg/dL (ref 70–99)
Glucose-Capillary: 95 mg/dL (ref 70–99)
Glucose-Capillary: 97 mg/dL (ref 70–99)

## 2023-07-24 LAB — MAGNESIUM: Magnesium: 2.4 mg/dL (ref 1.7–2.4)

## 2023-07-24 MED ORDER — DOCUSATE SODIUM 50 MG/5ML PO LIQD
100.0000 mg | Freq: Two times a day (BID) | ORAL | Status: DC
  Administered 2023-07-30: 100 mg via ORAL
  Filled 2023-07-24 (×5): qty 10

## 2023-07-24 MED ORDER — DEXTROSE 50 % IV SOLN
INTRAVENOUS | Status: AC
  Administered 2023-07-24: 12.5 g via INTRAVENOUS
  Filled 2023-07-24: qty 50

## 2023-07-24 MED ORDER — DEXTROSE 50 % IV SOLN: 12.5000 g | INTRAVENOUS | Status: AC

## 2023-07-24 NOTE — Plan of Care (Signed)

## 2023-07-24 NOTE — Progress Notes (Signed)
PROGRESS NOTE        PATIENT DETAILS Name: [name]. Age: 72 y.o. Sex: male Date of Birth: 1950-12-27 Admit Date: 07/19/2023 Admitting Physician Ejiroghene Wendall Stade, MD FTD:DUKGU, Lyman Bishop, MD  Brief Summary: Patient is a 72 y.o.  male who was recently diagnosed with gastric outlet obstruction  secondary to gastric adenocarcinoma-s/p palliative gastrojejunostomy on 11/13-presented to the hospital with upper GI bleeding in the setting of Eliquis for A-fib.  Initially admitted at [facility] he was stabilized-underwent EGD-which was complicated by V-fib arrest.  Patient was intubated and subsequently transferred to ICU here at [facility].  Echocardiogram showed new systolic dysfunction-patient was stabilized-extubated and subsequently transferred to [facility] on 11/29.  See below for further details.   Significant events: 11/25>> admit to [facility]-upper GI bleed-acute blood loss anemia 11/26>> EGD-active bleeding-successful hemostasis-but had V-fib arrest-intubated-transferred to ICU [facility]. 11/27>> extubated 11/29>> transferred to [facility].  Significant studies: 11/27>> EF 30-35%, RV systolic function moderately reduced.  RVSP 53.9.  Significant microbiology data: None  Procedures: 11/26>> RKY:HCWCB, fungating and ulcerated, circumferential mass with oozing bleeding and stigmata of recent bleeding was found in the gastric antrum.  Hemostatic spray applied. 11/26-11/27>> ETT  Consults: GI Palliative care PCCM Cardiology  Subjective: Answered all simple questions appropriately.  Patient is somewhat lethargic.  Nursing staff is at bedside.  Patient ate poorly. Objective: Vitals: Blood pressure 139/60, pulse (!) 108, temperature 99.4 F (37.4 C), temperature source Axillary, resp. rate (!) 26, height 5\' 11"  (1.803 m), weight 91.3 kg, SpO2 100%.   Exam: Gen Exam:Alert awake-not in any distress HEENT:atraumatic, normocephalic Chest: B/L clear to auscultation  anteriorly CVS:S1S2 regular Abdomen:soft non tender, non distended.  Staples in place. Extremities:no edema Neurology: Non focal-but with generalized weakness Skin: no rash  Pertinent Labs/Radiology:    Latest Ref Rng & Units 07/24/2023    4:08 AM 07/23/2023    2:58 PM 07/23/2023    4:11 AM  CBC  WBC 4.0 - 10.5 K/uL 3.3  4.1  5.3   Hemoglobin 13.0 - 17.0 g/dL 8.0  8.0  7.4   Hematocrit 39.0 - 52.0 % 26.1  26.5  24.5   Platelets 150 - 400 K/uL 100  105  107     Lab Results  Component Value Date   NA 148 (H) 07/24/2023   K 4.0 07/24/2023   CL 111 07/24/2023   CO2 27 07/24/2023     Assessment/Plan: V-fib arrest Occurred during EGD 11/26 Echo with new onset HFrEF and RV systolic dysfunction Obtaining cardiology opinion but poor candidate for aggressive care at this point Ongoing goals of care discussion-see below. -Palliative care evaluation  Combined distributive/hemorrhagic shock Required pressors briefly Resolved BP stable  Acute hypoxic respiratory failure in the setting of V-fib arrest Extubated 11/27 Currently on 2 L of oxygen Mobilize Pulmonary toileting -Patient had an event of being lethargic, increased oxygen requirement 11/29, started the patient on Rocephin, Flagyl treating as aspiration pneumonia  New diagnosis of HFrEF Volume status reasonable As needed IV Lasix based on daily exam-holding today due to bump in creatinine. Await cardiology opinion-poor candidate for any evaluation  Chronic atrial fibrillation Telemetry monitoring Rate well-controlled off rate control agents At this point-he is not candidate for any further anticoagulation-risks outweigh any benefits  Superficial venous thrombus-left cephalic vein Supportive care Not a candidate for anticoagulation  HTN BP soft but stable  Watch closely.  AKI Likely hemodynamically mediated Hold Lasix today Renal ultrasound Keep Foley in place-voiding trial over the next several days once  renal function improves further. Avoid nephrotoxic agents Patient has baseline creatinine of 1.09, trended up to 2.24 most likely from ATN  Acute metabolic encephalopathy Multifactorial etiology-from critical illness-ICU delirium/AKI Moving all 4 extremities-somewhat redirectable Maintain delirium precautions.  Upper GI bleeding with acute blood loss anemia Secondary to bleeding gastric malignancy-s/p EGD with hemostatic spray 11/26 Required 3 units of PRBC so far-last transfused 11/27 Hb levels fluctuating-but no overt bleeding noted Follow CBC closely and transfuse if significant drop/or evidence of overt bleeding Continue PPI twice daily  Recent diagnosis of gastric outlet obstruction secondary to gastric adenocarcinoma-s/p palliative gastrojejunostomy on 11/13-Dr. Pappayliou at [facility] Follow with Dr. Frances Maywood at [facility] Palliative care discussions ongoing-poor overall prognosis. Epic message sent to surgery at [facility] to see if we could discontinue his staples.  Addendum Discussed with Dr. Mikki Harbor to remove staples today  DM-2 (A1c 4.6 on 10/23) CBG stable on SSI  Recent Labs    07/24/23 0049 07/24/23 0348 07/24/23 0829  GLUCAP 118* 97 89    Palliative care Poor overall prognosis Full code for now Discussed with daughter/spouse at bedside-they understand tenuous situation-poor overall prognosis. Palliative care following-await further discussions with family.  Nutrition Status: Nutrition Problem: Moderate Malnutrition Etiology: acute illness (recently diagnosed gastric cancer) Signs/Symptoms: mild muscle depletion, mild fat depletion Interventions: Ensure Enlive (each supplement provides 350kcal and 20 grams of protein), MVI  Pressure Ulcer: Agree with assessment and plan as outlined below Pressure Injury 07/20/23 Buttocks Left Stage 2 -  Partial thickness loss of dermis presenting as a shallow open injury with a red, pink wound bed without  slough. (Active)  07/20/23 1845  Location: Buttocks  Location Orientation: Left  Staging: Stage 2 -  Partial thickness loss of dermis presenting as a shallow open injury with a red, pink wound bed without slough.  Wound Description (Comments):   Present on Admission: Yes  Dressing Type Foam - Lift dressing to assess site every shift 07/24/23 0000    BMI: Estimated body mass index is 28.07 kg/m as calculated from the following:   Height as of this encounter: 5\' 11"  (1.803 m).   Weight as of this encounter: 91.3 kg.   Code status:   Code Status: Full Code   DVT Prophylaxis: SCDs Start: 07/19/23 1926   Family Communication: Spouse/daughter at bedside   Disposition Plan: Status is: Inpatient Remains inpatient appropriate because: Severity of illness   Planned Discharge Destination:Skilled nursing facility   Diet: Diet Order             Diet NPO time specified Except for: Sips with Meds, Ice Chips  Diet effective now                     Antimicrobial agents: Anti-infectives (From admission, onward)    Start     Dose/Rate Route Frequency Ordered Stop   07/23/23 1845  cefTRIAXone (ROCEPHIN) 2 g in sodium chloride 0.9 % 100 mL IVPB        2 g 200 mL/hr over 30 Minutes Intravenous Every 24 hours 07/23/23 1756     07/23/23 1845  metroNIDAZOLE (FLAGYL) IVPB 500 mg        500 mg 100 mL/hr over 60 Minutes Intravenous Every 12 hours 07/23/23 1756          MEDICATIONS: Scheduled Meds:  Chlorhexidine Gluconate Cloth  6  each Topical Daily   docusate  100 mg Per Tube BID   feeding supplement  1 Container Oral TID BM   insulin aspart  0-6 Units Subcutaneous TID WC   pantoprazole (PROTONIX) IV  40 mg Intravenous Q12H   Continuous Infusions:  cefTRIAXone (ROCEPHIN)  IV 2 g (07/23/23 2136)   metronidazole 500 mg (07/23/23 2136)   PRN Meds:.acetaminophen **OR** acetaminophen, ipratropium-albuterol, ondansetron **OR** ondansetron (ZOFRAN) IV, mouth rinse   I have  personally reviewed following labs and imaging studies  LABORATORY DATA: CBC: Recent Labs  Lab 07/19/23 1338 07/20/23 0214 07/20/23 1507 07/20/23 1840 07/20/23 2218 07/21/23 0057 07/21/23 1409 07/23/23 0411 07/23/23 1458 07/24/23 0408  WBC 6.4   < > 8.5  --  6.7 7.9  --  5.3 4.1 3.3*  NEUTROABS 5.2  --  5.0  --  5.8  --   --  4.6  --   --   HGB 6.2*   < > 9.9*   < > 8.3* 7.7* 9.0* 7.4* 8.0* 8.0*  HCT 19.9*   < > 31.1*   < > 25.7* 23.9* 27.9* 24.5* 26.5* 26.1*  MCV 101.5*   < > 97.2  --  91.1 90.5  --  92.8 93.3 93.2  PLT 234   < > 217  --  204 172  --  107* 105* 100*   < > = values in this interval not displayed.    Basic Metabolic Panel: Recent Labs  Lab 07/20/23 1507 07/20/23 1840 07/21/23 0057 07/22/23 0418 07/23/23 0411 07/24/23 0408  NA 142 143 142 142 145 148*  K 3.7 3.6 3.7 4.7 4.3 4.0  CL 110  --  111 110 111 111  CO2 25  --  21* 25 27 27   GLUCOSE 92  --  203* 99 93 101*  BUN 19  --  20 27* 33* 37*  CREATININE 1.41*  --  1.63* 1.88* 1.96* 2.24*  CALCIUM 8.2*  --  8.3* 8.3* 8.0* 8.4*  MG  --   --  1.4* 2.8* 2.9* 2.4  PHOS  --   --   --  4.1 4.2  --     GFR: Estimated Creatinine Clearance: 35 mL/min (A) (by C-G formula based on SCr of 2.24 mg/dL (H)).  Liver Function Tests: Recent Labs  Lab 07/20/23 1507 07/22/23 0418 07/24/23 0408  AST 17  --  13*  ALT 13  --  10  ALKPHOS 52  --  50  BILITOT 1.1  --  1.0  PROT 5.6*  --  4.6*  ALBUMIN 2.8* 1.8* 1.9*   No results for input(s): "LIPASE", "AMYLASE" in the last 168 hours. No results for input(s): "AMMONIA" in the last 168 hours.  Coagulation Profile: No results for input(s): "INR", "PROTIME" in the last 168 hours.  Cardiac Enzymes: Recent Labs  Lab 07/20/23 1507  CKTOTAL 35*  CKMB 1.4    BNP (last 3 results) No results for input(s): "PROBNP" in the last 8760 hours.  Lipid Profile: No results for input(s): "CHOL", "HDL", "LDLCALC", "TRIG", "CHOLHDL", "LDLDIRECT" in the last 72  hours.  Thyroid Function Tests: No results for input(s): "TSH", "T4TOTAL", "FREET4", "T3FREE", "THYROIDAB" in the last 72 hours.  Anemia Panel: No results for input(s): "VITAMINB12", "FOLATE", "FERRITIN", "TIBC", "IRON", "RETICCTPCT" in the last 72 hours.  Urine analysis:    Component Value Date/Time   COLORURINE YELLOW 07/09/2023 1840   APPEARANCEUR HAZY (A) 07/09/2023 1840   APPEARANCEUR Clear 05/03/2023 1056   LABSPEC 1.020  07/09/2023 1840   PHURINE 5.0 07/09/2023 1840   GLUCOSEU NEGATIVE 07/09/2023 1840   HGBUR SMALL (A) 07/09/2023 1840   BILIRUBINUR NEGATIVE 07/09/2023 1840   BILIRUBINUR Negative 05/03/2023 1056   KETONESUR 5 (A) 07/09/2023 1840   PROTEINUR 100 (A) 07/09/2023 1840   NITRITE NEGATIVE 07/09/2023 1840   LEUKOCYTESUR NEGATIVE 07/09/2023 1840    Sepsis Labs: Lactic Acid, Venous    Component Value Date/Time   LATICACIDVEN 1.0 09/06/2017 2047    MICROBIOLOGY: Recent Results (from the past 240 hour(s))  MRSA Next Gen by PCR, Nasal     Status: None   Collection Time: 07/20/23  6:07 PM   Specimen: Nasal Mucosa; Nasal Swab  Result Value Ref Range Status   MRSA by PCR Next Gen NOT DETECTED NOT DETECTED Final    Comment: (NOTE) The GeneXpert MRSA Assay (FDA approved for NASAL specimens only), is one component of a comprehensive MRSA colonization surveillance program. It is not intended to diagnose MRSA infection nor to guide or monitor treatment for MRSA infections. Test performance is not FDA approved in patients less than 71 years old. Performed at [facility], [address] [place], [place] [address]     RADIOLOGY STUDIES/RESULTS: DG Chest Port 1V same Day  Result Date: 07/23/2023 CLINICAL DATA:  Shortness of breath. EXAM: PORTABLE CHEST 1 VIEW COMPARISON:  July 20, 2023. FINDINGS: Stable cardiomediastinal silhouette. Endotracheal tube and right internal jugular catheter have been removed. Mildly increased left perihilar and bibasilar  opacities are noted suggesting worsening edema or possibly inflammation. Small bilateral pleural effusions may be present. Status post surgical fusion of midthoracic spine. IMPRESSION: Endotracheal and right internal jugular catheter have been removed. Mildly increased bilateral lung opacities as noted above Electronically Signed   By: Lupita Raider M.D.   On: 07/23/2023 17:52   US RENAL  Result Date: 07/23/2023 CLINICAL DATA:  Acute kidney insufficiency EXAM: RENAL / URINARY TRACT ULTRASOUND COMPLETE COMPARISON:  None Available. FINDINGS: Right Kidney: Renal measurements: 10.1 x 4.4 x 5.0 cm = volume: 115.0 mL. Echogenicity within normal limits. No mass or hydronephrosis visualized. Left Kidney: Renal measurements: 10.0 x 4.4 x 4.4 cm = volume: 101.3 mL. Echogenicity within normal limits. No mass or hydronephrosis visualized. Bladder: Bladder is contracted with a Foley. Other: None. IMPRESSION: No collecting system dilatation.  Contracted urinary bladder. Electronically Signed   By: Karen Kays M.D.   On: 07/23/2023 14:54     LOS: 5 days   Winfrey Chillemi, MD  [facility]    To contact the attending provider between 7A-7P or the covering provider during after hours 7P-7A, please log into the web site [email or web address] and access using universal  password for that web site. If you do not have the password, please call the hospital operator.  07/24/2023, 8:55 AM

## 2023-07-24 NOTE — Plan of Care (Signed)

## 2023-07-24 NOTE — Evaluation (Signed)
SLP Cancellation Note  Patient Details Name: [name]. MRN: [id number] DOB: 07-Nov-1950   Cancelled treatment:       Reason Eval/Treat Not Completed: Other (comment) (From examining prior SLP note, his intake was very poor and pt is vomiting. Per EGD, he has an esophageal mass and gastric outlet obstruction and even if he has some pharyngeal dysphagia, IMO, his primary risk is esophageal/GI) SLP does not see role for this pt, Informed RN, who is in agreement.  Will sign off.   Rolena Infante, MS [facility] SLP Acute Rehab Services Office [phone]  Chales Abrahams 07/24/2023, 7:57 AM

## 2023-07-24 NOTE — Progress Notes (Signed)
Patient in yellow mews at start of shift

## 2023-07-24 NOTE — Progress Notes (Addendum)
Rounding Note    Patient Name: [name]. Date of Encounter: 07/24/2023   [facility] Cardiologist: Frank Rich, MD   Subjective   Weak, lethargic but arousebale this morning.   Inpatient Medications    Scheduled Meds:  Chlorhexidine Gluconate Cloth  6 each Topical Daily   docusate  100 mg Oral BID   feeding supplement  1 Container Oral TID BM   insulin aspart  0-6 Units Subcutaneous TID WC   pantoprazole (PROTONIX) IV  40 mg Intravenous Q12H   Continuous Infusions:  cefTRIAXone (ROCEPHIN)  IV 2 g (07/23/23 2136)   metronidazole 500 mg (07/24/23 0908)   PRN Meds: acetaminophen **OR** acetaminophen, ipratropium-albuterol, ondansetron **OR** ondansetron (ZOFRAN) IV, mouth rinse   Vital Signs    Vitals:   07/23/23 2200 07/24/23 0015 07/24/23 0400 07/24/23 0800  BP: (!) 111/57 (!) 120/57 (!) 149/63 139/60  Pulse: (!) 121 (!) 107 (!) 108 (!) 108  Resp: (!) 21 (!) 23 (!) 29 (!) 26  Temp: 98.9 F (37.2 C) 98.3 F (36.8 C) 99 F (37.2 C) 99.4 F (37.4 C)  TempSrc: Axillary Axillary Axillary Axillary  SpO2: 99% 99% 100%   Weight:      Height:        Intake/Output Summary (Last 24 hours) at 07/24/2023 0936 Last data filed at 07/24/2023 1914 Gross per 24 hour  Intake 200 ml  Output 1575 ml  Net -1375 ml      07/21/2023    4:45 AM 07/20/2023    6:00 PM 07/19/2023    1:30 PM  Last 3 Weights  Weight (lbs) 201 lb 4.5 oz 194 lb 3.6 oz 199 lb 8.3 oz  Weight (kg) 91.3 kg 88.1 kg 90.5 kg      Telemetry    Aflutter 100-110 - Personally Reviewed  ECG    N/a - Personally Reviewed  Physical Exam   GEN: No acute distress.   Neck: No JVD Cardiac: irregular, rate 105  Respiratory: Clear to auscultation bilaterally. GI: Soft, nontender, non-distended  MS: No edema; No deformity. Neuro:  Nonfocal  Psych: Normal affect   Labs    High Sensitivity Troponin:   Recent Labs  Lab 07/20/23 1530 07/21/23 1201  TROPONINIHS 33* 67*      Chemistry Recent Labs  Lab 07/20/23 1507 07/20/23 1840 07/22/23 0418 07/23/23 0411 07/24/23 0408  NA 142   < > 142 145 148*  K 3.7   < > 4.7 4.3 4.0  CL 110   < > 110 111 111  CO2 25   < > 25 27 27   GLUCOSE 92   < > 99 93 101*  BUN 19   < > 27* 33* 37*  CREATININE 1.41*   < > 1.88* 1.96* 2.24*  CALCIUM 8.2*   < > 8.3* 8.0* 8.4*  MG  --    < > 2.8* 2.9* 2.4  PROT 5.6*  --   --   --  4.6*  ALBUMIN 2.8*  --  1.8*  --  1.9*  AST 17  --   --   --  13*  ALT 13  --   --   --  10  ALKPHOS 52  --   --   --  50  BILITOT 1.1  --   --   --  1.0  GFRNONAA 53*   < > 38* 36* 31*  ANIONGAP 7   < > 7 7 10    < > = values  in this interval not displayed.    Lipids No results for input(s): "CHOL", "TRIG", "HDL", "LABVLDL", "LDLCALC", "CHOLHDL" in the last 168 hours.  Hematology Recent Labs  Lab 07/23/23 0411 07/23/23 1458 07/24/23 0408  WBC 5.3 4.1 3.3*  RBC 2.64* 2.84* 2.80*  HGB 7.4* 8.0* 8.0*  HCT 24.5* 26.5* 26.1*  MCV 92.8 93.3 93.2  MCH 28.0 28.2 28.6  MCHC 30.2 30.2 30.7  RDW 18.4* 18.2* 17.9*  PLT 107* 105* 100*   Thyroid No results for input(s): "TSH", "FREET4" in the last 168 hours.  BNP Recent Labs  Lab 07/20/23 2218  BNP 388.1*    DDimer No results for input(s): "DDIMER" in the last 168 hours.   Radiology    DG Chest Port 1V same Day  Result Date: 07/23/2023 CLINICAL DATA:  Shortness of breath. EXAM: PORTABLE CHEST 1 VIEW COMPARISON:  July 20, 2023. FINDINGS: Stable cardiomediastinal silhouette. Endotracheal tube and right internal jugular catheter have been removed. Mildly increased left perihilar and bibasilar opacities are noted suggesting worsening edema or possibly inflammation. Small bilateral pleural effusions may be present. Status post surgical fusion of midthoracic spine. IMPRESSION: Endotracheal and right internal jugular catheter have been removed. Mildly increased bilateral lung opacities as noted above Electronically Signed   By: Lupita Raider  M.D.   On: 07/23/2023 17:52   US RENAL  Result Date: 07/23/2023 CLINICAL DATA:  Acute kidney insufficiency EXAM: RENAL / URINARY TRACT ULTRASOUND COMPLETE COMPARISON:  None Available. FINDINGS: Right Kidney: Renal measurements: 10.1 x 4.4 x 5.0 cm = volume: 115.0 mL. Echogenicity within normal limits. No mass or hydronephrosis visualized. Left Kidney: Renal measurements: 10.0 x 4.4 x 4.4 cm = volume: 101.3 mL. Echogenicity within normal limits. No mass or hydronephrosis visualized. Bladder: Bladder is contracted with a Foley. Other: None. IMPRESSION: No collecting system dilatation.  Contracted urinary bladder. Electronically Signed   By: Karen Kays M.D.   On: 07/23/2023 14:54    Cardiac Studies     Patient Profile     [name]. is a 72 y.o. male with a history of chronic diastolic CHF, persistent atrial flutter on Eliquis, pleural effusion s/p right thoracentesis in 07/2019, chronic lower extremity edema felt to largely be due to chronic venous insufficiency, recent left cephalic vein thrombus, hypertension, hyperlipidemia, type 2 diabetes mellitus, iron deficiency anemia, diffuse large B cell lymphoma, and recent gastric outlet obstruction secondary to gastric adenocarcinoma who is being seen 07/23/2023 for the evaluation of ventricular fibrillation arrest at the request of Dr. Jerral Ralph.   Assessment & Plan    1.VF arrest - -admitted on 07/19/2023 for acute anemia with hemoglobin as low as 5.4. He underwent EGD on 11/26 which showed a bleeding gastric mass in the antrum. Hemostasis was achieved with hemo-spray. Upon withdrawal of the scope, patient went into ventricular fibrillation.   - ROSC within 2 minutes, post recovery EKG showed aflutter with LBBB. No significant trop rise - 06/2023 echo: LVEF 30-35%, global hypokinesis.  - with bleeding gastric mass, severe anemia not a cath candidate  - keep K at 4, Mag at 2 - start beta blocker when taking PO - consider lifevest at  discharge pending ongoing goals of care discussions.     2.HFrEF - 07/2022 echo: LVEF 55-60% - 06/2023 echo: LVEF 30-35%, global hypokinesis. This was post arrest - medical therapy limited by NPO status, renal dysfunction/AKI GFR 31 - no indication for diuretic at this time   3. Gastric adenocarcinoma/Gastric outlet obstruction/GI bleed -  admitted on 07/19/2023 for acute anemia with hemoglobin as low as 5.4. He underwent EGD on 11/26 which showed a bleeding gastric mass in the antrum.  -Required 3 units of PRBC so far-last transfused 11/27  - from primary team overall poor prognosis in talks with oncology. Palliative care involved  4.Persistent aflutter - Can restart home Toprol-XL 25mg  daily when able to take PO medications. - On chronic anticoagulation with Eliquis at home. Currently on hold in setting of acute anemia secondary to upper GI bleed from gastric tumor. Will defer when it is safe to restart this to GI. - rates low 100s reasonable, if consistently 120s or higher would start IV lopressor since he is NPO  For questions or updates, please contact [facility] Please consult [email or web address] for contact info under        Signed, Frank Rich, MD  07/24/2023, 9:36 AM

## 2023-07-25 DIAGNOSIS — C169 Malignant neoplasm of stomach, unspecified: Secondary | ICD-10-CM | POA: Diagnosis not present

## 2023-07-25 DIAGNOSIS — K921 Melena: Secondary | ICD-10-CM | POA: Diagnosis not present

## 2023-07-25 DIAGNOSIS — R627 Adult failure to thrive: Secondary | ICD-10-CM

## 2023-07-25 DIAGNOSIS — I5021 Acute systolic (congestive) heart failure: Secondary | ICD-10-CM

## 2023-07-25 LAB — GLUCOSE, CAPILLARY
Glucose-Capillary: 78 mg/dL (ref 70–99)
Glucose-Capillary: 76 mg/dL (ref 70–99)
Glucose-Capillary: 75 mg/dL (ref 70–99)
Glucose-Capillary: 73 mg/dL (ref 70–99)
Glucose-Capillary: 101 mg/dL — ABNORMAL HIGH (ref 70–99)
Glucose-Capillary: 85 mg/dL (ref 70–99)

## 2023-07-25 LAB — CBC WITH DIFFERENTIAL/PLATELET
Abs Immature Granulocytes: 0 10*3/uL (ref 0.00–0.07)
Basophils Absolute: 0 10*3/uL (ref 0.0–0.1)
Basophils Relative: 0 %
Eosinophils Absolute: 0 10*3/uL (ref 0.0–0.5)
Eosinophils Relative: 1 %
HCT: 26.6 % — ABNORMAL LOW (ref 39.0–52.0)
Hemoglobin: 8 g/dL — ABNORMAL LOW (ref 13.0–17.0)
Lymphocytes Relative: 6 %
Lymphs Abs: 0.2 10*3/uL — ABNORMAL LOW (ref 0.7–4.0)
MCH: 28.2 pg (ref 26.0–34.0)
MCHC: 30.1 g/dL (ref 30.0–36.0)
MCV: 93.7 fL (ref 80.0–100.0)
Monocytes Absolute: 0.2 10*3/uL (ref 0.1–1.0)
Monocytes Relative: 6 %
Neutro Abs: 2.9 10*3/uL (ref 1.7–7.7)
Neutrophils Relative %: 87 %
Platelets: 101 10*3/uL — ABNORMAL LOW (ref 150–400)
RBC: 2.84 MIL/uL — ABNORMAL LOW (ref 4.22–5.81)
RDW: 18 % — ABNORMAL HIGH (ref 11.5–15.5)
WBC: 3.3 10*3/uL — ABNORMAL LOW (ref 4.0–10.5)
nRBC: 0 % (ref 0.0–0.2)
nRBC: 0 /100{WBCs}

## 2023-07-25 LAB — BASIC METABOLIC PANEL
Anion gap: 13 (ref 5–15)
BUN: 41 mg/dL — ABNORMAL HIGH (ref 8–23)
CO2: 27 mmol/L (ref 22–32)
Calcium: 8.5 mg/dL — ABNORMAL LOW (ref 8.9–10.3)
Chloride: 114 mmol/L — ABNORMAL HIGH (ref 98–111)
Creatinine, Ser: 2.36 mg/dL — ABNORMAL HIGH (ref 0.61–1.24)
GFR, Estimated: 29 mL/min — ABNORMAL LOW (ref >60)
Glucose, Bld: 86 mg/dL (ref 70–99)
Potassium: 3.5 mmol/L (ref 3.5–5.1)
Sodium: 154 mmol/L — ABNORMAL HIGH (ref 135–145)

## 2023-07-25 LAB — TYPE AND SCREEN
ABO/RH(D): A POS
Antibody Screen: NEGATIVE

## 2023-07-25 LAB — BRAIN NATRIURETIC PEPTIDE: B Natriuretic Peptide: 293.9 pg/mL — ABNORMAL HIGH (ref 0.0–100.0)

## 2023-07-25 LAB — PHOSPHORUS: Phosphorus: 3.1 mg/dL (ref 2.5–4.6)

## 2023-07-25 LAB — MAGNESIUM: Magnesium: 2.4 mg/dL (ref 1.7–2.4)

## 2023-07-25 MED ORDER — DEXTROSE 5 % IV SOLN: INTRAVENOUS | Status: DC

## 2023-07-25 MED ORDER — METOPROLOL TARTRATE 5 MG/5ML IV SOLN: 5.0000 mg | Freq: Three times a day (TID) | INTRAVENOUS | Status: DC | PRN

## 2023-07-25 MED ORDER — CARVEDILOL 6.25 MG PO TABS
6.2500 mg | ORAL_TABLET | Freq: Two times a day (BID) | ORAL | Status: DC
  Administered 2023-07-25 – 2023-07-30 (×4): 6.25 mg via ORAL
  Filled 2023-07-25 (×9): qty 1

## 2023-07-25 NOTE — Progress Notes (Addendum)
NAME:  Frank Lieske., MRN:  [id number], DOB:  Jan 01, 1951, LOS: 6 ADMISSION DATE:  07/19/2023, CONSULTATION DATE:  07/20/2023 REFERRING MD: [place] - [facility] , CHIEF COMPLAINT: Shock postcardiac arrest  History of Present Illness:  72 year old man who is received in transfer from [facility] for shock following a cardiac arrest during an endoscopy performed to investigate upper GI bleeding.  The patient had presented from an acute rehab facility with complaints of melanotic stools without vomiting, abdominal pain or frank hematochezia.  His hemoglobin was 5.4.  He was transfused, started on a Protonix infusion, Kcentra was given to reverse anticoagulation, and GI was consulted.  He had recently and hospitalized from 11/7 to 11/21 for a gastric outlet obstruction due to a gastric carcinoma for which he underwent a palliative gastric jejunostomy on 11/13.  He was then transferred from rehabilitation from which she was readmitted to hospital this time.  He has a history of atrial flutter for which she was anticoagulated, autoimmune hemolytic anemia, stage III kidney disease, diastolic heart failure hypertension, diabetes and a prior history of diffuse B-cell lymphoma, status post autologous stem cell transplant in 2007.  He had also recently been diagnosed with a right upper extremity superficial phlebitis.  He underwent upper endoscopy today revealed a large fungating and ulcerated circumferential mass in the gastric antrum.  Hemostatic spray was deployed with successful hemostasis.  During the procedure the patient suffered a brief cardiac arrest requiring 2 minutes of CPR and 1 dose of epinephrine he spontaneously converted to atrial flutter without defibrillation.  Heart rate eventually improved spontaneously.  He was started on norepinephrine infusions to support his blood pressure.  The procedure was completed and the patient was transferred on dexmedetomidine for sedation.  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 11/26-evidence of fungating mass with active bleeding in the gastric antrum.  Successful hemostasis with hemostatic spray. Transfer to [facility] following brief cardiac arrest during endoscopy requiring intubation initiation of vasopressors.  Arterial line and right IJ central line placed. 11/27 no acute events overnight remains on low-dose pressors hemoglobin slightly down trended to 7.7  Interim History / Subjective:  Called back to reevaluate failure to thrive and general decline. More encephalopathic today. Family has fired palliative care and has not been receptive to suggestions that deescalating his care is appropriate. Oncology will be coming in today. No bleeding since leaving the ICU.   Objective   Blood pressure (!) 144/60, pulse (!) 116, temperature 99.4 F (37.4 C), temperature source Axillary, resp. rate 20, height 5\' 11"  (1.803 m), weight 91.3 kg, SpO2 96%.        Intake/Output Summary (Last 24 hours) at 07/25/2023 1307 Last data filed at 07/25/2023 0700 Gross per 24 hour  Intake  400 ml  Output 650 ml  Net -250 ml   Filed Weights   07/19/23 1330 07/20/23 1800 07/21/23 0445  Weight: 90.5 kg 88.1 kg 91.3 kg    Examination: General: ill appearing man lying in bed in NAD HEENT: [place]/AT, eyes  anicteric Neuro: Lethargic but able to arouse more to answer questions with verbal stimulation, but falls back asleep. Not oriented to place. Moving all extremities.  Chest: mild tachypnea, no accessory muscle use Heart: S1S2, tachycardic reg rhythm  Abdomen: soft, NT. Hard mass in LUQ Skin: no rashes  Na+ 154 K+ 3.5 H/H 41/2.36 BNP 293 WBC 3.3 H/H 8/26.6 Platelets 101   Resolved Problems:    Assessment & Plan:  Status post in-hospital V-fib cardiac arrest postprocedure Combined distributive/hemorrhagic shock, resolved Atrial flutter anticoagulated at baseline with Eliquis Chronic biventricular HFrEF Hypertension Acute hypoxic respiratory failure with possible aspiration pneumonitis causing reactive airway disease. Acute metabolic encephalopathy Upper GI bleed secondary to bleeding gastric tumor Known gastric carcinoma status post palliative gastrojejunostomy Superficial venous thrombosis of left cephalic vein AKI Diabetes type II Dehydration pancytopenia   -agree with IVF and supportive care -no acute ICU needs at this time -long term prognosis grim  I met with Ms. Meharg at bedside. [name] was not verbal during our encounter, but I spoke to his wife about his symptoms and ongoing hospital course. My concern is his encephalopathy is likely related to prolonged hospital stay and poor sleep. His advanced cancer is potentially also at play. I doubt this is medication-induced.    We had a lengthy discussion about her faith. She feels that she is not living her truth by not trying to speak positive outcomes into existence. She acknowledges that this made it hard to interact with the medical staff that have to give her negative news because it makes her feel very isolated. She described that she understands bodies break down, and I agreed that his is in this case. I related my concern that at times it feels that care we provide runs the risk of going against God's plan, and she  said she needed time to think about that because she has though that possibility before herself. I let her know that my role as a physician is to be transparent with her about what we are seeing with her husband-- I worry he is near the end of his life and has poor options. She appreciated the time during our conversation but is not ready to discuss changes to his care at this time.   Palliative care and primary team updated after this meeting.    Best Practice (right click and "Reselect all SmartList Selections" daily)   Diet/type: Clear liquid diet DVT prophylaxis: SCDs Start: 07/19/23 1926 Pressure ulcer(s): not present on admission  GI prophylaxis: PPI Lines: none Foley: Yes, discontinue Code Status:  full code Last date of multidisciplinary goals of care discussion: 11/26: Wife updated over the phone.      Steffanie Dunn, DO 07/25/23 3:26 PM [facility]  For contact information, see Amion. If no response to pager, please call PCCM consult pager. After hours, 7PM- 7AM, please call Elink.

## 2023-07-25 NOTE — Progress Notes (Addendum)
Rounding Note    Patient Name: [name]. Date of Encounter: 07/25/2023  [facility] Cardiologist: Dina Rich, MD   Subjective   Lethargic this morning  Inpatient Medications    Scheduled Meds:  Chlorhexidine Gluconate Cloth  6 each Topical Daily   docusate  100 mg Oral BID   feeding supplement  1 Container Oral TID BM   insulin aspart  0-6 Units Subcutaneous TID WC   pantoprazole (PROTONIX) IV  40 mg Intravenous Q12H   Continuous Infusions:  cefTRIAXone (ROCEPHIN)  IV Stopped (07/24/23 1815)   metronidazole 500 mg (07/25/23 3235)   PRN Meds: acetaminophen **OR** acetaminophen, ipratropium-albuterol, ondansetron **OR** ondansetron (ZOFRAN) IV, mouth rinse   Vital Signs    Vitals:   07/25/23 0045 07/25/23 0200 07/25/23 0300 07/25/23 0745  BP:   134/66 (!) 148/68  Pulse: (!) 110  (!) 109 (!) 103  Resp: (!) 23  (!) 28 (!) 23  Temp:   98.2 F (36.8 C) 99.4 F (37.4 C)  TempSrc:   Oral Axillary  SpO2: 93% 93% 99%   Weight:      Height:        Intake/Output Summary (Last 24 hours) at 07/25/2023 0908 Last data filed at 07/25/2023 0700 Gross per 24 hour  Intake 400 ml  Output 650 ml  Net -250 ml      07/21/2023    4:45 AM 07/20/2023    6:00 PM 07/19/2023    1:30 PM  Last 3 Weights  Weight (lbs) 201 lb 4.5 oz 194 lb 3.6 oz 199 lb 8.3 oz  Weight (kg) 91.3 kg 88.1 kg 90.5 kg      Telemetry    Aflutter low 100s - Personally Reviewed  ECG    N/a - Personally Reviewed  Physical Exam   GEN: lethargic   Neck: No JVD Cardiac: irregular, tachy 110 Respiratory: Clear to auscultation bilaterally. GI: Soft, nontender, non-distended  MS: No edema; No deformity. Neuro:  Nonfocal  Psych: Normal affect   Labs    High Sensitivity Troponin:   Recent Labs  Lab 07/20/23 1530 07/21/23 1201  TROPONINIHS 33* 67*     Chemistry Recent Labs  Lab 07/20/23 1507 07/20/23 1840 07/22/23 0418 07/23/23 0411 07/24/23 0408 07/25/23 0652   NA 142   < > 142 145 148* 154*  K 3.7   < > 4.7 4.3 4.0 3.5  CL 110   < > 110 111 111 114*  CO2 25   < > 25 27 27 27   GLUCOSE 92   < > 99 93 101* 86  BUN 19   < > 27* 33* 37* 41*  CREATININE 1.41*   < > 1.88* 1.96* 2.24* 2.36*  CALCIUM 8.2*   < > 8.3* 8.0* 8.4* 8.5*  MG  --    < > 2.8* 2.9* 2.4 2.4  PROT 5.6*  --   --   --  4.6*  --   ALBUMIN 2.8*  --  1.8*  --  1.9*  --   AST 17  --   --   --  13*  --   ALT 13  --   --   --  10  --   ALKPHOS 52  --   --   --  50  --   BILITOT 1.1  --   --   --  1.0  --   GFRNONAA 53*   < > 38* 36* 31* 29*  ANIONGAP 7   < >  7 7 10 13    < > = values in this interval not displayed.    Lipids No results for input(s): "CHOL", "TRIG", "HDL", "LABVLDL", "LDLCALC", "CHOLHDL" in the last 168 hours.  Hematology Recent Labs  Lab 07/23/23 1458 07/24/23 0408 07/25/23 0652  WBC 4.1 3.3* 3.3*  RBC 2.84* 2.80* 2.84*  HGB 8.0* 8.0* 8.0*  HCT 26.5* 26.1* 26.6*  MCV 93.3 93.2 93.7  MCH 28.2 28.6 28.2  MCHC 30.2 30.7 30.1  RDW 18.2* 17.9* 18.0*  PLT 105* 100* 101*   Thyroid No results for input(s): "TSH", "FREET4" in the last 168 hours.  BNP Recent Labs  Lab 07/20/23 2218 07/25/23 0652  BNP 388.1* 293.9*    DDimer No results for input(s): "DDIMER" in the last 168 hours.   Radiology    DG Chest Port 1V same Day  Result Date: 07/23/2023 CLINICAL DATA:  Shortness of breath. EXAM: PORTABLE CHEST 1 VIEW COMPARISON:  July 20, 2023. FINDINGS: Stable cardiomediastinal silhouette. Endotracheal tube and right internal jugular catheter have been removed. Mildly increased left perihilar and bibasilar opacities are noted suggesting worsening edema or possibly inflammation. Small bilateral pleural effusions may be present. Status post surgical fusion of midthoracic spine. IMPRESSION: Endotracheal and right internal jugular catheter have been removed. Mildly increased bilateral lung opacities as noted above Electronically Signed   By: Lupita Raider M.D.    On: 07/23/2023 17:52   US RENAL  Result Date: 07/23/2023 CLINICAL DATA:  Acute kidney insufficiency EXAM: RENAL / URINARY TRACT ULTRASOUND COMPLETE COMPARISON:  None Available. FINDINGS: Right Kidney: Renal measurements: 10.1 x 4.4 x 5.0 cm = volume: 115.0 mL. Echogenicity within normal limits. No mass or hydronephrosis visualized. Left Kidney: Renal measurements: 10.0 x 4.4 x 4.4 cm = volume: 101.3 mL. Echogenicity within normal limits. No mass or hydronephrosis visualized. Bladder: Bladder is contracted with a Foley. Other: None. IMPRESSION: No collecting system dilatation.  Contracted urinary bladder. Electronically Signed   By: Karen Kays M.D.   On: 07/23/2023 14:54    Cardiac Studies    Patient Profile     [name]. is a 72 y.o. male with a history of chronic diastolic CHF, persistent atrial flutter on Eliquis, pleural effusion s/p right thoracentesis in 07/2019, chronic lower extremity edema felt to largely be due to chronic venous insufficiency, recent left cephalic vein thrombus, hypertension, hyperlipidemia, type 2 diabetes mellitus, iron deficiency anemia, diffuse large B cell lymphoma, and recent gastric outlet obstruction secondary to gastric adenocarcinoma who is being seen 07/23/2023 for the evaluation of ventricular fibrillation arrest at the request of Dr. Jerral Ralph.   Assessment & Plan    1.VF arrest - -admitted on 07/19/2023 for acute anemia with hemoglobin as low as 5.4. He underwent EGD on 11/26 which showed a bleeding gastric mass in the antrum. Hemostasis was achieved with hemo-spray. Upon withdrawal of the scope, patient went into ventricular fibrillation.   - ROSC within 2 minutes, post recovery EKG showed aflutter with LBBB. No significant trop rise - 06/2023 echo: LVEF 30-35%, global hypokinesis.  - with bleeding gastric mass, severe anemia not a cath candidate   - keep K at 4, Mag at 2 - start beta blocker when taking PO - possible lifevest candidate at  discharge pending ongoing goals of care discussions during this admission       2.HFrEF - 07/2022 echo: LVEF 55-60% - 06/2023 echo: LVEF 30-35%, global hypokinesis. This was post arrest - medical therapy limited by NPO status with  gastric outlet obstruction, renal dysfunction/AKI GFR 29 - no indication for diuretic at this time     3. Gastric adenocarcinoma/Gastric outlet obstruction/GI bleed -admitted on 07/19/2023 for acute anemia with hemoglobin as low as 5.4. He underwent EGD on 11/26 which showed a bleeding gastric mass in the antrum.  -Required 3 units of PRBC so far-last transfused 11/27  - from primary team overall poor prognosis in talks with oncology. Palliative care involved   4.Persistent aflutter - Can restart home Toprol-XL 25mg  daily when able to take PO medications. - On chronic anticoagulation with Eliquis at home. Currently on hold in setting of acute anemia secondary to upper GI bleed from gastric tumor. Will defer when it is safe to restart this to GI. - rates low 100s reasonable, if consistently 120s or higher would start IV lopressor since he is NPO    From primary team ongoing talks with family and palliative care to consider hospice. Nothing acutely needed from cardiac standpoint at this time. We will follow peripherally for the time being, f/u results of goals of care talks. Not able to treat his heart failure medically as he cannot take pills with gastric obstruction, not a cath candidate with gastric cancer and recent GI bleed. Really no cardiac options for management at this time.     For questions or updates, please contact [facility] Please consult [email or web address] for contact info under        Signed, Dina Rich, MD  07/25/2023, 9:08 AM

## 2023-07-25 NOTE — Plan of Care (Signed)
  Problem: Education: Goal: Knowledge of General Education information will improve Description: Including pain rating scale, medication(s)/side effects and non-pharmacologic comfort measures Outcome: Progressing   Problem: Health Behavior/Discharge Planning: Goal: Ability to manage health-related needs will improve Outcome: Progressing   Problem: Clinical Measurements: Goal: Ability to maintain clinical measurements within normal limits will improve Outcome: Progressing Goal: Will remain free from infection Outcome: Progressing Goal: Diagnostic test results will improve Outcome: Progressing Goal: Respiratory complications will improve Outcome: Progressing Goal: Cardiovascular complication will be avoided Outcome: Progressing   Problem: Activity: Goal: Risk for activity intolerance will decrease Outcome: Progressing   Problem: Nutrition: Goal: Adequate nutrition will be maintained Outcome: Progressing   Problem: Coping: Goal: Level of anxiety will decrease Outcome: Progressing   Problem: Elimination: Goal: Will not experience complications related to bowel motility Outcome: Progressing Goal: Will not experience complications related to urinary retention Outcome: Progressing   Problem: Pain Management: Goal: General experience of comfort will improve Outcome: Progressing   Problem: Safety: Goal: Ability to remain free from injury will improve Outcome: Progressing   Problem: Skin Integrity: Goal: Risk for impaired skin integrity will decrease Outcome: Progressing   Problem: Education: Goal: Ability to describe self-care measures that may prevent or decrease complications (Diabetes Survival Skills Education) will improve Outcome: Progressing Goal: Individualized Educational Video(s) Outcome: Progressing   Problem: Coping: Goal: Ability to adjust to condition or change in health will improve Outcome: Progressing   Problem: Fluid Volume: Goal: Ability to  maintain a balanced intake and output will improve Outcome: Progressing   Problem: Health Behavior/Discharge Planning: Goal: Ability to identify and utilize available resources and services will improve Outcome: Progressing Goal: Ability to manage health-related needs will improve Outcome: Progressing   Problem: Metabolic: Goal: Ability to maintain appropriate glucose levels will improve Outcome: Progressing   Problem: Nutritional: Goal: Maintenance of adequate nutrition will improve Outcome: Progressing Goal: Progress toward achieving an optimal weight will improve Outcome: Progressing   Problem: Skin Integrity: Goal: Risk for impaired skin integrity will decrease Outcome: Progressing   Problem: Tissue Perfusion: Goal: Adequacy of tissue perfusion will improve Outcome: Progressing   Problem: Activity: Goal: Ability to tolerate increased activity will improve Outcome: Progressing   Problem: Respiratory: Goal: Ability to maintain a clear airway and adequate ventilation will improve Outcome: Progressing   Problem: Role Relationship: Goal: Method of communication will improve Outcome: Progressing

## 2023-07-25 NOTE — Progress Notes (Addendum)
PROGRESS NOTE        PATIENT DETAILS Name: [name]. Age: 72 y.o. Sex: male Date of Birth: [birth year] Admit Date: 07/19/2023 Admitting Physician Ejiroghene Wendall Stade, MD YNW:GNFAO, Lyman Bishop, MD  Brief Summary: Patient is a 72 y.o.  male who was recently diagnosed with gastric outlet obstruction  secondary to gastric adenocarcinoma-s/p palliative gastrojejunostomy on 11/13-presented to the hospital with upper GI bleeding in the setting of Eliquis for A-fib.  Initially admitted at [facility] he was stabilized-underwent EGD-which was complicated by V-fib arrest.  Patient was intubated and subsequently transferred to ICU here at [facility].  Echocardiogram showed new systolic dysfunction-patient was stabilized-extubated and subsequently transferred to [facility] on 11/29.  See below for further details.  Significant events: 11/25>> admit to [facility]-upper GI bleed-acute blood loss anemia 11/26>> EGD-active bleeding-successful hemostasis-but had V-fib arrest-intubated-transferred to ICU [facility]. 11/27>> extubated 11/29>> transferred to [facility].  Significant studies: 11/27>> EF 30-35%, RV systolic function moderately reduced.  RVSP 53.9.  Significant microbiology data: None  Procedures: 11/26>> ZHY:QMVHQ, fungating and ulcerated, circumferential mass with oozing bleeding and stigmata of recent bleeding was found in the gastric antrum.  Hemostatic spray applied. 11/26-11/27>> ETT  Consults: GI Palliative care PCCM Cardiology Oncology  Subjective: Seen in bed denies any headache chest or abdominal pain, no shortness of breath.  Objective: Vitals: Blood pressure (!) 156/66, pulse (!) 116, temperature 99.4 F (37.4 C), temperature source Axillary, resp. rate (!) 30, height 5\' 11"  (1.803 m), weight 91.3 kg, SpO2 96%.   Exam:  Awake, no focal deficits, extremely frail and weak, weak cough reflex [place].AT,PERRAL Supple Neck, No JVD,   Symmetrical Chest wall movement, Good air  movement bilaterally, CTAB RRR,No Gallops, Rubs or new Murmurs,  +ve B.Sounds, Abd Soft, No tenderness,   No Cyanosis, Clubbing or edema   Assessment/Plan:  V-fib arrest Occurred during EGD 11/26 Echo with new onset HFrEF and RV systolic dysfunction, EF 35%. Neurology following poor candidate for invasive procedure or testing due to underlying recent diagnosis of stage IV gastric cancer, continue Coreg, cannot give aspirin or Plavix due to recent GI bleed from the gastric cancer.  Monitor with supportive care.  Combined distributive/hemorrhagic shock Required pressors briefly Resolved Transfuse as needed try to keep hemoglobin above 7.5, monitor CBC  Acute hypoxic respiratory failure in the setting of V-fib arrest Extubated 11/27 Currently on 2 L of oxygen Mobilize Pulmonary toileting  New diagnosis of HFrEF EF now 30 to 35%. On Coreg, not on ACE inhibitor, ARB or Entresto due to AKI, cardiology on board, limited options for treatment kindly see #1 above.  Chronic atrial fibrillation [place] vas 2 score of greater than 4. Telemetry monitoring Rate well-controlled off rate control agents Placed on oral Coreg and IV Lopressor for rate control, continue to monitor.  Superficial venous thrombus-left cephalic vein Supportive care Not a candidate for anticoagulation, poor candidate for it due to recent GI bleed from the recently diagnosed gastric cancer.  HTN BP soft but stable Watch closely.  AKI with hypernatremia He is dehydrated, D5W, oral diet, avoid nephrotoxins, continue Foley for now  Acute metabolic encephalopathy Multifactorial etiology-from critical illness-ICU delirium/AKI Moving all 4 extremities-somewhat redirectable Maintain delirium precautions.  Dysphagia.  Due to encephalopathy and severe deconditioning.  Unable to take oral medications or diet, clear liquid diet if tolerated, speech therapy, gentle D5, poor candidate for NG tube  or PEG tube.     Upper GI  bleeding with acute blood loss anemia Secondary to bleeding gastric malignancy-s/p EGD with hemostatic spray 11/26 Required 3 units of PRBC so far-last transfused 11/27 Type screen repeated again on 07/25/2023 after IV fluids expected to drop, will likely require more transfusions in the next few days.  Continue PPI IV.  Recent diagnosis of gastric outlet obstruction secondary to gastric adenocarcinoma-s/p palliative gastrojejunostomy on 11/13-Dr. Pappayliou at [facility] Follow with Dr. Frances Maywood at [facility], palliative care also on board but family and patient wish all aggressive treatment, in my opinion he is a candidate for medical treatment directed towards comfort, he should be DNR but family and patient resistant.  Will request palliative care and ICU team both to evaluate the patient and give their opinion on his prognosis and line of care.   DM-2 (A1c 4.6 on 10/23) CBG stable on SSI  Recent Labs    07/25/23 0234 07/25/23 0644 07/25/23 0751  GLUCAP 78 76 75    Palliative care Poor overall prognosis Full code for now Discussed with daughter/spouse at bedside-they understand tenuous situation-poor overall prognosis. Wife and patient want to pursue aggressive measures.  Palliative care, oncology and PCCM will be involved to opine.  Nutrition Status: Nutrition Problem: Moderate Malnutrition Etiology: acute illness (recently diagnosed gastric cancer) Signs/Symptoms: mild muscle depletion, mild fat depletion Interventions: Ensure Enlive (each supplement provides 350kcal and 20 grams of protein), MVI  Pressure Ulcer: Agree with assessment and plan as outlined below Pressure Injury 07/20/23 Buttocks Left Stage 2 -  Partial thickness loss of dermis presenting as a shallow open injury with a red, pink wound bed without slough. (Active)  07/20/23 1845  Location: Buttocks  Location Orientation: Left  Staging: Stage 2 -  Partial thickness loss of dermis presenting as a  shallow open injury with a red, pink wound bed without slough.  Wound Description (Comments):   Present on Admission: Yes  Dressing Type Foam - Lift dressing to assess site every shift 07/25/23 0745    BMI: Estimated body mass index is 28.07 kg/m as calculated from the following:   Height as of this encounter: 5\' 11"  (1.803 m).   Weight as of this encounter: 91.3 kg.   Code status:   Code Status: Full Code   DVT Prophylaxis: SCDs Start: 07/19/23 1926   Family Communication: Spouse/daughter at bedside   Disposition Plan: Status is: Inpatient Remains inpatient appropriate because: Severity of illness   Planned Discharge Destination:Skilled nursing facility   Diet: Diet Order             Diet clear liquid Room service appropriate? Yes; Fluid consistency: Thin  Diet effective now                     Antimicrobial agents: Anti-infectives (From admission, onward)    Start     Dose/Rate Route Frequency Ordered Stop   07/23/23 1845  cefTRIAXone (ROCEPHIN) 2 g in sodium chloride 0.9 % 100 mL IVPB        2 g 200 mL/hr over 30 Minutes Intravenous Every 24 hours 07/23/23 1756     07/23/23 1845  metroNIDAZOLE (FLAGYL) IVPB 500 mg        500 mg 100 mL/hr over 60 Minutes Intravenous Every 12 hours 07/23/23 1756          MEDICATIONS: Scheduled Meds:  carvedilol  6.25 mg Oral BID WC   Chlorhexidine Gluconate Cloth  6 each Topical Daily  docusate  100 mg Oral BID   feeding supplement  1 Container Oral TID BM   insulin aspart  0-6 Units Subcutaneous TID WC   pantoprazole (PROTONIX) IV  40 mg Intravenous Q12H   Continuous Infusions:  cefTRIAXone (ROCEPHIN)  IV Stopped (07/24/23 1815)   dextrose     metronidazole 500 mg (07/25/23 1610)   PRN Meds:.acetaminophen **OR** acetaminophen, ipratropium-albuterol, metoprolol tartrate, ondansetron **OR** ondansetron (ZOFRAN) IV, mouth rinse   I have personally reviewed following labs and imaging studies  LABORATORY  DATA: CBC: Recent Labs  Lab 07/19/23 1338 07/20/23 0214 07/20/23 1507 07/20/23 1840 07/20/23 2218 07/21/23 0057 07/21/23 1409 07/23/23 0411 07/23/23 1458 07/24/23 0408 07/25/23 0652  WBC 6.4   < > 8.5  --  6.7 7.9  --  5.3 4.1 3.3* 3.3*  NEUTROABS 5.2  --  5.0  --  5.8  --   --  4.6  --   --  2.9  HGB 6.2*   < > 9.9*   < > 8.3* 7.7* 9.0* 7.4* 8.0* 8.0* 8.0*  HCT 19.9*   < > 31.1*   < > 25.7* 23.9* 27.9* 24.5* 26.5* 26.1* 26.6*  MCV 101.5*   < > 97.2  --  91.1 90.5  --  92.8 93.3 93.2 93.7  PLT 234   < > 217  --  204 172  --  107* 105* 100* 101*   < > = values in this interval not displayed.    Basic Metabolic Panel: Recent Labs  Lab 07/21/23 0057 07/22/23 0418 07/23/23 0411 07/24/23 0408 07/25/23 0652  NA 142 142 145 148* 154*  K 3.7 4.7 4.3 4.0 3.5  CL 111 110 111 111 114*  CO2 21* 25 27 27 27   GLUCOSE 203* 99 93 101* 86  BUN 20 27* 33* 37* 41*  CREATININE 1.63* 1.88* 1.96* 2.24* 2.36*  CALCIUM 8.3* 8.3* 8.0* 8.4* 8.5*  MG 1.4* 2.8* 2.9* 2.4 2.4  PHOS  --  4.1 4.2  --  3.1    GFR: Estimated Creatinine Clearance: 33.2 mL/min (A) (by C-G formula based on SCr of 2.36 mg/dL (H)).  Liver Function Tests: Recent Labs  Lab 07/20/23 1507 07/22/23 0418 07/24/23 0408  AST 17  --  13*  ALT 13  --  10  ALKPHOS 52  --  50  BILITOT 1.1  --  1.0  PROT 5.6*  --  4.6*  ALBUMIN 2.8* 1.8* 1.9*   Urine analysis:    Component Value Date/Time   COLORURINE YELLOW 07/09/2023 1840   APPEARANCEUR HAZY (A) 07/09/2023 1840   APPEARANCEUR Clear 05/03/2023 1056   LABSPEC 1.020 07/09/2023 1840   PHURINE 5.0 07/09/2023 1840   GLUCOSEU NEGATIVE 07/09/2023 1840   HGBUR SMALL (A) 07/09/2023 1840   BILIRUBINUR NEGATIVE 07/09/2023 1840   BILIRUBINUR Negative 05/03/2023 1056   KETONESUR 5 (A) 07/09/2023 1840   PROTEINUR 100 (A) 07/09/2023 1840   NITRITE NEGATIVE 07/09/2023 1840   LEUKOCYTESUR NEGATIVE 07/09/2023 1840    Sepsis Labs: Lactic Acid, Venous    Component Value  Date/Time   LATICACIDVEN 1.0 09/06/2017 2047    MICROBIOLOGY: Recent Results (from the past 240 hour(s))  MRSA Next Gen by PCR, Nasal     Status: None   Collection Time: 07/20/23  6:07 PM   Specimen: Nasal Mucosa; Nasal Swab  Result Value Ref Range Status   MRSA by PCR Next Gen NOT DETECTED NOT DETECTED Final    Comment: (NOTE) The GeneXpert MRSA Assay (FDA  approved for NASAL specimens only), is one component of a comprehensive MRSA colonization surveillance program. It is not intended to diagnose MRSA infection nor to guide or monitor treatment for MRSA infections. Test performance is not FDA approved in patients less than 23 years old. Performed at [facility], [address] [place], [place] [address]     RADIOLOGY STUDIES/RESULTS: DG Chest Port 1V same Day  Result Date: 07/23/2023 CLINICAL DATA:  Shortness of breath. EXAM: PORTABLE CHEST 1 VIEW COMPARISON:  July 20, 2023. FINDINGS: Stable cardiomediastinal silhouette. Endotracheal tube and right internal jugular catheter have been removed. Mildly increased left perihilar and bibasilar opacities are noted suggesting worsening edema or possibly inflammation. Small bilateral pleural effusions may be present. Status post surgical fusion of midthoracic spine. IMPRESSION: Endotracheal and right internal jugular catheter have been removed. Mildly increased bilateral lung opacities as noted above Electronically Signed   By: Lupita Raider M.D.   On: 07/23/2023 17:52   US RENAL  Result Date: 07/23/2023 CLINICAL DATA:  Acute kidney insufficiency EXAM: RENAL / URINARY TRACT ULTRASOUND COMPLETE COMPARISON:  None Available. FINDINGS: Right Kidney: Renal measurements: 10.1 x 4.4 x 5.0 cm = volume: 115.0 mL. Echogenicity within normal limits. No mass or hydronephrosis visualized. Left Kidney: Renal measurements: 10.0 x 4.4 x 4.4 cm = volume: 101.3 mL. Echogenicity within normal limits. No mass or hydronephrosis visualized. Bladder:  Bladder is contracted with a Foley. Other: None. IMPRESSION: No collecting system dilatation.  Contracted urinary bladder. Electronically Signed   By: Karen Kays M.D.   On: 07/23/2023 14:54     LOS: 6 days   Signature  -    Susa Raring M.D on 07/25/2023 at 11:20 AM   -  To page go to [email or web address]

## 2023-07-25 NOTE — Progress Notes (Signed)
Late entry  Hypoglycemic Event  CBG: 68  Treatment: D50 25 mL (12.5 gm)  Symptoms: None  Follow-up CBG: Time:0049 CBG Result: 118  Possible Reasons for Event: Inadequate meal intake  Comments/MD notified: Dr. Arlean Hopping made aware of. Treated patient per protocol.     [name]

## 2023-07-25 NOTE — Consult Note (Signed)
[facility]  [phone]   HEMATOLOGY/ONCOLOGY IN-PATIENT CONSULTATION NOTE   PATIENT NAME: [name].   MR#: [id number] DOB: 30-Mar-1951 CSN#: [id number]   DATE OF SERVICE: 07/25/2023  Requesting Physician: [facility]   Patient Care Team: [name], MD as PCP - General (Internal Medicine) Wyline Mood, Dorothe Pea, MD as PCP - Cardiology (Cardiology) Jena Gauss Gerrit Friends, MD (Gastroenterology) Doreatha Massed, MD as Consulting Physician (Hematology) Cherlyn Cushing, RN as Oncology Nurse Navigator McKenzie, Mardene Celeste, MD as Consulting Physician (Urology) Margaretmary Dys, MD as Consulting Physician (Radiation Oncology)  REASON FOR CONSULTATION:  Management decisions in a patient with recently diagnosed gastric carcinoma with carcinomatosis  HISTORY OF PRESENT ILLNESS  Frank Gradillas. is a 72 y.o. gentleman with history of DLBCL diagnosed in 2006, treated with R-CHOP with recurrence of lymphoma in 2007, s/p stem cell transplant in May 2007 followed by right X. Mab maintenance completed in 2009.  Past medical history is also significant for hypertension, atrial flutter, diastolic heart failure, type 2 diabetes mellitus, prostate cancer s/p radiation treatments.   He was recently diagnosed with gastric adenocarcinoma after he presented to ER on 07/01/2023 with complaints of nausea, vomiting, weight loss.  CT abdomen and pelvis on 07/01/2023 showed mural thickening throughout the stomach with possible gastric outlet obstruction, lymphadenopathy, mesenteric nodules concerning for carcinomatosis.  He underwent EGD on 07/02/2023 which showed gastric antral mass.  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  He was seen by our colleague Dr. Ellin Saba at [facility] during that hospitalization.  At that time recommendation was to get PET scan for staging and then consider systemic treatments.  Also recommended  gastrojejunostomy for palliation given gastric outlet obstruction.  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  He presented to the hospital again on 07/19/2023 for upper GI bleeding in the setting of Eliquis for A-fib.  Initially admitted at [facility] where he was stabilized.  Underwent EGD which was complicated with V-fib arrest.  Patient was intubated and subsequently transferred to ICU at [facility].  Echocardiogram showed new systolic dysfunction with LVEF of 30 to 35%.  Patient was stabilized and extubated and subsequently transferred to the floor on 07/23/2023.  Patient's family members wanted aggressive treatments including systemic chemotherapy.  This is despite suggestions by other specialties that he may not be a candidate for aggressive treatments given his multiple medical issues currently.  Since family wanted to have a discussion, our service was consulted earlier today for assistance.  Patient seen and evaluated. He was somnolent and could not contribute to history.  His wife was by the bedside and provided most of the history.  MEDICAL HISTORY 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   autologous stem cell transplant 12/2005   Obesity, morbid (more than 100 lbs over ideal weight or BMI > 40) (HCC)    Venous insufficiency 04/29/2011     SURGICAL HISTORY Past Surgical History:  Procedure Laterality Date   BACK SURGERY  2006   T6 vertebrae removed/titanium  placed   BIOPSY  07/02/2023   Procedure: BIOPSY;  Surgeon: Franky Macho, MD;  Location: [facility];   Service: Endoscopy;;   CATARACT EXTRACTION W/PHACO Right 03/31/2019   Procedure: CATARACT EXTRACTION PHACO AND INTRAOCULAR LENS PLACEMENT (IOC);  Surgeon: Fabio Pierce, MD;  Location: [facility];  Service: Ophthalmology;  Laterality: Right;  CDE: 3.21   CATARACT EXTRACTION W/PHACO Left 04/14/2019   Procedure: CATARACT EXTRACTION PHACO AND INTRAOCULAR LENS PLACEMENT (IOC);  Surgeon: Fabio Pierce, MD;  Location: [facility];  Service: Ophthalmology;  Laterality: Left;  left - pt knows to arrive at 7:45, CDE: 3.55   COLONOSCOPY  11/24/2004   Polyps in the left colon ablated/removed as described above.  Two submucosal lesions consistent with lipomas as described above not  manipulated./ Normal rectum   COLONOSCOPY  06/20/2012   MULTIPLE RECTAL AND COLONIC POLYPS   COLONOSCOPY N/A 03/08/2015   RMR: Capacious, redundant colon. Multiple colonic and rectosigmoid polyps removed. ablated as described above. colonic lipoma abnormal appearing terminal ileum likely a variant of normal). Howeverwith history  biopsies obtained.    ESOPHAGOGASTRODUODENOSCOPY N/A 03/08/2015   RMR: Hiatal hernia Polypoid gastric mucosa with multiple gastric polyps. largest polyp removed via snare polypectomgy hemostasis clip placed at base. Status post gastric biopsy. Status post video capsule placement.    ESOPHAGOGASTRODUODENOSCOPY (EGD) WITH PROPOFOL N/A 07/02/2023   Procedure: ESOPHAGOGASTRODUODENOSCOPY (EGD) WITH PROPOFOL;  Surgeon: Franky Macho, MD;  [facility];  Service: Endoscopy;  Laterality: N/A;   ESOPHAGOGASTRODUODENOSCOPY (EGD) WITH PROPOFOL N/A 07/20/2023   Procedure: ESOPHAGOGASTRODUODENOSCOPY (EGD) WITH PROPOFOL;  Surgeon: Franky Macho, MD;  [facility];  Service: Endoscopy;  Laterality: N/A;   GASTROJEJUNOSTOMY N/A 07/07/2023   Procedure: GASTROJEJUNOSTOMY;  Surgeon: Lewie Chamber, DO;  Location: [facility];  Service: General;  Laterality: N/A;   GIVENS CAPSULE STUDY N/A 03/08/2015    Procedure: GIVENS CAPSULE STUDY;  Surgeon: Corbin Ade, MD;  [facility];  Service: Endoscopy;  Laterality: N/A;   GOLD SEED IMPLANT N/A 09/08/2022   Procedure: GOLD SEED IMPLANT;  Surgeon: Malen Gauze, MD;  Location: [facility];  Service: Urology;  Laterality: N/A;   HEMOSTASIS CONTROL  07/20/2023   Procedure: HEMOSTASIS CONTROL;  Surgeon: Franky Macho, MD;  [facility];  Service: Endoscopy;;   LIMBAL STEM CELL TRANSPLANT     LUNG BIOPSY  6/06   MULTIPLE TOOTH EXTRACTIONS  04/2005   port a cath placement     PORT-A-CATH REMOVAL  09/08/2012   Procedure: REMOVAL PORT-A-CATH;  Surgeon: Loreli Slot, MD;  Location: [facility] OR;  Service: Thoracic;  Laterality: N/A;   SPACE OAR INSTILLATION N/A 09/08/2022   Procedure: SPACE OAR INSTILLATION;  Surgeon: Malen Gauze, MD;  Location: [facility];  Service: Urology;  Laterality: N/A;     ALLERGIES  Allergies  Allergen Reactions   Penicillins Rash    FAMILY HISTORY  Family History  Problem Relation Age of Onset   Cancer Mother        lung   Hypertension Mother    Hyperlipidemia Mother    Cancer Father        prostate   Hypertension Father    Colon cancer Neg Hx    Diabetes Neg Hx      SOCIAL HISTORY   Social History   Socioeconomic History   Marital status: Married    Spouse name: Not on file   Number of children: 2   Years of  education: Not on file   Highest education level: Not on file  Occupational History   Occupation: disability due to back    Employer: UNEMPLOYED  Tobacco Use   Smoking status: Former    Current packs/day: 0.00    Average packs/day: 0.5 packs/day for 15.0 years (7.5 ttl pk-yrs)    Types: Cigarettes    Start date: 11/11/1987    Quit date: 11/11/2002    Years since quitting: 20.7   Smokeless tobacco: Never  Vaping Use   Vaping status: Never Used  Substance and Sexual Activity   Alcohol use: No    Alcohol/week: 0.0 standard drinks of alcohol   Drug use: No    Sexual activity: Yes    Birth control/protection: None  Other Topics Concern   Not on file  Social History Narrative   Married   No regular exercise   Social Determinants of Health   Financial Resource Strain: Low Risk  (06/28/2020)   Overall Financial Resource Strain (CARDIA)    Difficulty of Paying Living Expenses: Not hard at all  Food Insecurity: No Food Insecurity (07/19/2023)   Hunger Vital Sign    Worried About Running Out of Food in the Last Year: Never true    Ran Out of Food in the Last Year: Never true  Transportation Needs: No Transportation Needs (07/19/2023)   PRAPARE - Administrator, Civil Service (Medical): No    Lack of Transportation (Non-Medical): No  Physical Activity: Inactive (06/28/2020)   Exercise Vital Sign    Days of Exercise per Week: 0 days    Minutes of Exercise per Session: 0 min  Stress: No Stress Concern Present (06/28/2020)   [employer] of Occupational Health - Occupational Stress Questionnaire    Feeling of Stress : Not at all  Social Connections: Moderately Integrated (06/28/2020)   Social Connection and Isolation Panel [NHANES]    Frequency of Communication with Friends and Family: More than three times a week    Frequency of Social Gatherings with Friends and Family: Twice a week    Attends Religious Services: More than 4 times per year    Active Member of [employer] or Organizations: No    Attends Banker Meetings: Never    Marital Status: Married  Catering manager Violence: Not At Risk (07/19/2023)   Humiliation, Afraid, Rape, and Kick questionnaire    Fear of Current or Ex-Partner: No    Emotionally Abused: No    Physically Abused: No    Sexually Abused: No    CURRENT MEDICATIONS   Current Outpatient Medications  Medication Instructions   apixaban (ELIQUIS) 5 mg, Oral, 2 times daily   bisacodyl (DULCOLAX) 10 mg, Rectal, Daily PRN   Blood Glucose Monitoring Suppl (ONE TOUCH ULTRA 2) w/Device KIT USE TO TEST  BLOOD SUGAR ONCE D UTD   feeding supplement (ENSURE ENLIVE / ENSURE PLUS) LIQD 237 mLs, Oral, 3 times daily between meals   furosemide (LASIX) 20 mg, Oral, Daily PRN   Lancets (ONETOUCH DELICA PLUS LANCET33G) MISC USE TO TEST TWICE DAILY   metoprolol succinate (TOPROL XL) 25 mg, Oral, Daily, Take with or immediately following a meal.   ONETOUCH ULTRA test strip TEST TWICE DAILY   oxyCODONE (OXY IR/ROXICODONE) 5 mg, Oral, Every 6 hours PRN   polyethylene glycol (MIRALAX / GLYCOLAX) 17 g, Oral, Daily   potassium chloride (KLOR-CON) 10 MEQ tablet Take 10 meq on days you need to take Lasix   prochlorperazine (COMPAZINE) 10 mg, Oral,  Every 6 hours PRN   rosuvastatin (CRESTOR) 40 mg, Oral, Daily   simethicone (MYLICON) 80 mg, Oral, Every 6 hours PRN   torsemide (DEMADEX) 20 mg, Oral, Daily   Vitamin D (Ergocalciferol) (DRISDOL) 50,000 Units, Oral, Weekly     REVIEW OF SYSTEMS   Review of Systems  Unable to perform ROS: Acuity of condition     PHYSICAL EXAMINATION  ECOG PERFORMANCE STATUS: 3 - Symptomatic, >50% confined to bed  Vitals:   07/25/23 1024 07/25/23 1200  BP: (!) 156/66 (!) 144/60  Pulse: (!) 116   Resp: (!) 30 20  Temp:    SpO2: 96%    Filed Weights   07/19/23 1330 07/20/23 1800 07/21/23 0445  Weight: 199 lb 8.3 oz (90.5 kg) 194 lb 3.6 oz (88.1 kg) 201 lb 4.5 oz (91.3 kg)    Physical Exam Constitutional:      Appearance: He is ill-appearing.  Cardiovascular:     Rate and Rhythm: Normal rate and regular rhythm.     Heart sounds: Normal heart sounds.  Pulmonary:     Effort: Pulmonary effort is normal.     Breath sounds: Normal breath sounds.  Abdominal:     General: There is no distension.      LABORATORY DATA:   I have reviewed the data as listed  Results for orders placed or performed during the hospital encounter of 07/19/23 (from the past 24 hour(s))  Glucose, capillary   Collection Time: 07/24/23  4:58 PM  Result Value Ref Range    Glucose-Capillary 90 70 - 99 mg/dL  Glucose, capillary   Collection Time: 07/24/23  9:43 PM  Result Value Ref Range   Glucose-Capillary 90 70 - 99 mg/dL  Glucose, capillary   Collection Time: 07/25/23  2:34 AM  Result Value Ref Range   Glucose-Capillary 78 70 - 99 mg/dL  Type and screen   Collection Time: 07/25/23  6:40 AM  Result Value Ref Range   ABO/RH(D) A POS    Antibody Screen NEG    Sample Expiration      07/28/2023,2359 Performed at [facility], [address], [place], [place] [address]   Glucose, capillary   Collection Time: 07/25/23  6:44 AM  Result Value Ref Range   Glucose-Capillary 76 70 - 99 mg/dL  CBC with Differential/Platelet   Collection Time: 07/25/23  6:52 AM  Result Value Ref Range   WBC 3.3 (L) 4.0 - 10.5 K/uL   RBC 2.84 (L) 4.22 - 5.81 MIL/uL   Hemoglobin 8.0 (L) 13.0 - 17.0 g/dL   HCT 60.4 (L) 54.0 - 98.1 %   MCV 93.7 80.0 - 100.0 fL   MCH 28.2 26.0 - 34.0 pg   MCHC 30.1 30.0 - 36.0 g/dL   RDW 19.1 (H) 47.8 - 29.5 %   Platelets 101 (L) 150 - 400 K/uL   nRBC 0.0 0.0 - 0.2 %   Neutrophils Relative % 87 %   Neutro Abs 2.9 1.7 - 7.7 K/uL   Lymphocytes Relative 6 %   Lymphs Abs 0.2 (L) 0.7 - 4.0 K/uL   Monocytes Relative 6 %   Monocytes Absolute 0.2 0.1 - 1.0 K/uL   Eosinophils Relative 1 %   Eosinophils Absolute 0.0 0.0 - 0.5 K/uL   Basophils Relative 0 %   Basophils Absolute 0.0 0.0 - 0.1 K/uL   nRBC 0 0 /100 WBC   Abs Immature Granulocytes 0.00 0.00 - 0.07 K/uL   Pappenheimer Bodies PRESENT  Polychromasia PRESENT   Brain natriuretic peptide   Collection Time: 07/25/23  6:52 AM  Result Value Ref Range   B Natriuretic Peptide 293.9 (H) 0.0 - 100.0 pg/mL  Basic metabolic panel   Collection Time: 07/25/23  6:52 AM  Result Value Ref Range   Sodium 154 (H) 135 - 145 mmol/L   Potassium 3.5 3.5 - 5.1 mmol/L   Chloride 114 (H) 98 - 111 mmol/L   CO2 27 22 - 32 mmol/L   Glucose, Bld 86 70 - 99 mg/dL   BUN 41 (H) 8 - 23 mg/dL    Creatinine, Ser 5.64 (H) 0.61 - 1.24 mg/dL   Calcium 8.5 (L) 8.9 - 10.3 mg/dL   GFR, Estimated 29 (L) >60 mL/min   Anion gap 13 5 - 15  Phosphorus   Collection Time: 07/25/23  6:52 AM  Result Value Ref Range   Phosphorus 3.1 2.5 - 4.6 mg/dL  Magnesium   Collection Time: 07/25/23  6:52 AM  Result Value Ref Range   Magnesium 2.4 1.7 - 2.4 mg/dL  Glucose, capillary   Collection Time: 07/25/23  7:51 AM  Result Value Ref Range   Glucose-Capillary 75 70 - 99 mg/dL  Glucose, capillary   Collection Time: 07/25/23 11:27 AM  Result Value Ref Range   Glucose-Capillary 73 70 - 99 mg/dL      RADIOGRAPHIC STUDIES:  I have personally reviewed the radiological images as listed and agree with the findings in the report.  DG Chest Port 1V same Day  Result Date: 07/23/2023 CLINICAL DATA:  Shortness of breath. EXAM: PORTABLE CHEST 1 VIEW COMPARISON:  July 20, 2023. FINDINGS: Stable cardiomediastinal silhouette. Endotracheal tube and right internal jugular catheter have been removed. Mildly increased left perihilar and bibasilar opacities are noted suggesting worsening edema or possibly inflammation. Small bilateral pleural effusions may be present. Status post surgical fusion of midthoracic spine. IMPRESSION: Endotracheal and right internal jugular catheter have been removed. Mildly increased bilateral lung opacities as noted above Electronically Signed   By: Lupita Raider M.D.   On: 07/23/2023 17:52   US RENAL  Result Date: 07/23/2023 CLINICAL DATA:  Acute kidney insufficiency EXAM: RENAL / URINARY TRACT ULTRASOUND COMPLETE COMPARISON:  None Available. FINDINGS: Right Kidney: Renal measurements: 10.1 x 4.4 x 5.0 cm = volume: 115.0 mL. Echogenicity within normal limits. No mass or hydronephrosis visualized. Left Kidney: Renal measurements: 10.0 x 4.4 x 4.4 cm = volume: 101.3 mL. Echogenicity within normal limits. No mass or hydronephrosis visualized. Bladder: Bladder is contracted with a Foley.  Other: None. IMPRESSION: No collecting system dilatation.  Contracted urinary bladder. Electronically Signed   By: Karen Kays M.D.   On: 07/23/2023 14:54   ECHOCARDIOGRAM COMPLETE  Result Date: 07/21/2023    ECHOCARDIOGRAM REPORT   Patient Name:   [name]. Date of Exam: 07/21/2023 Medical Rec #:  [id number]        Height:       71.0 in Accession #:    [id number]       Weight:       201.3 lb Date of Birth:  [birth year]        BSA:          2.114 m Patient Age:    72 years         BP:           112/26 mmHg Patient Gender: M  HR:           40 bpm. Exam Location:  Inpatient Procedure: 2D Echo, Cardiac Doppler, Color Doppler and Intracardiac            Opacification Agent Indications:    Cardiac arrest I46.9  History:        Patient has prior history of Echocardiogram examinations, most                 recent 08/06/2022. Arrythmias:Atrial Flutter; Risk                 Factors:Hypertension, Diabetes and Dyslipidemia.  Sonographer:    Lucendia Herrlich RCS Referring Phys: [id number] RAVI AGARWALA IMPRESSIONS  1. Left ventricular ejection fraction, by estimation, is 30 to 35%. The left ventricle has moderately decreased function. The left ventricle demonstrates global hypokinesis. Left ventricular diastolic function could not be evaluated.  2. Right ventricular systolic function is moderately reduced. The right ventricular size is normal. There is moderately elevated pulmonary artery systolic pressure. The estimated right ventricular systolic pressure is 53.9 mmHg.  3. Left atrial size was severely dilated.  4. The mitral valve is grossly normal. Moderate mitral valve regurgitation.  5. The aortic valve is tricuspid. Aortic valve regurgitation is not visualized. No aortic stenosis is present.  6. The inferior vena cava is dilated in size with <50% respiratory variability, suggesting right atrial pressure of 15 mmHg. Comparison(s): Prior images reviewed side by side. The left ventricular function is  significantly worse. The right ventricular systolic function is significantly worse. FINDINGS  Left Ventricle: There is no left ventricular thrombus (Definity contrast was used). There is profound systolic dyssynchrony due to abnormal electrical activation (LBBB versus idioventricular rhythm). Left ventricular ejection fraction, by estimation, is  30 to 35%. The left ventricle has moderately decreased function. The left ventricle demonstrates global hypokinesis. Definity contrast agent was given IV to delineate the left ventricular endocardial borders. The left ventricular internal cavity size was normal in size. There is no left ventricular hypertrophy. Left ventricular diastolic function could not be evaluated due to atrial fibrillation. Left ventricular diastolic function could not be evaluated. Right Ventricle: The right ventricular size is normal. No increase in right ventricular wall thickness. Right ventricular systolic function is moderately reduced. There is moderately elevated pulmonary artery systolic pressure. The tricuspid regurgitant velocity is 3.12 m/s, and with an assumed right atrial pressure of 15 mmHg, the estimated right ventricular systolic pressure is 53.9 mmHg. Left Atrium: Left atrial size was severely dilated. Right Atrium: Right atrial size was normal in size. Pericardium: There is no evidence of pericardial effusion. Mitral Valve: The mitral valve is grossly normal. Mild mitral annular calcification. Moderate mitral valve regurgitation. Tricuspid Valve: The tricuspid valve is normal in structure. Tricuspid valve regurgitation is mild. Aortic Valve: The aortic valve is tricuspid. Aortic valve regurgitation is not visualized. No aortic stenosis is present. Aortic valve peak gradient measures 7.4 mmHg. Pulmonic Valve: The pulmonic valve was grossly normal. Pulmonic valve regurgitation is trivial. No evidence of pulmonic stenosis. Aorta: The aortic root and ascending aorta are structurally  normal, with no evidence of dilitation. Venous: The inferior vena cava is dilated in size with less than 50% respiratory variability, suggesting right atrial pressure of 15 mmHg. IAS/Shunts: The interatrial septum was not well visualized.  LEFT VENTRICLE PLAX 2D LVIDd:         4.00 cm   Diastology LVIDs:         2.70 cm   LV e'  lateral:   8.94 cm/s LV PW:         1.10 cm   LV E/e' lateral: 11.3 LV IVS:        1.10 cm LVOT diam:     2.30 cm LV SV:         72 LV SV Index:   34 LVOT Area:     4.15 cm  RIGHT VENTRICLE         IVC TAPSE (M-mode): 0.9 cm  IVC diam: 2.60 cm LEFT ATRIUM              Index        RIGHT ATRIUM           Index LA diam:        5.30 cm  2.51 cm/m   RA Area:     12.50 cm LA Vol (A2C):   119.0 ml 56.28 ml/m  RA Volume:   23.40 ml  11.07 ml/m LA Vol (A4C):   105.0 ml 49.66 ml/m LA Biplane Vol: 117.0 ml 55.34 ml/m  AORTIC VALVE                 PULMONIC VALVE AV Area (Vmax): 3.24 cm     PR End Diast Vel: 14.29 msec AV Vmax:        136.00 cm/s AV Peak Grad:   7.4 mmHg LVOT Vmax:      106.00 cm/s LVOT Vmean:     59.533 cm/s LVOT VTI:       0.174 m  AORTA Ao Root diam: 3.40 cm Ao Asc diam:  3.70 cm MITRAL VALVE                TRICUSPID VALVE MV Area (PHT): 3.49 cm     TR Peak grad:   38.9 mmHg MV Decel Time: 217 msec     TR Vmax:        312.00 cm/s MR Peak grad: 95.6 mmHg MR Vmax:      489.00 cm/s   SHUNTS MV E velocity: 100.77 cm/s  Systemic VTI:  0.17 m MV A velocity: 19.65 cm/s   Systemic Diam: 2.30 cm MV E/A ratio:  5.13 Mihai Croitoru MD Electronically signed by Thurmon Fair MD Signature Date/Time: 07/21/2023/3:49:10 PM    Final    DG CHEST PORT 1 VIEW  Result Date: 07/20/2023 CLINICAL DATA:  Intubated, asthma EXAM: PORTABLE CHEST 1 VIEW COMPARISON:  07/20/2023 FINDINGS: Single frontal view of the chest demonstrates endotracheal tube overlying tracheal air column, tip midway between thoracic inlet and carina. No change in position of the right internal jugular catheter, tip  overlying the central mediastinum. Please correlate with catheter function, as intra arterial positioning cannot be excluded. Cardiac silhouette is unremarkable. Increased central vascular congestion and perihilar airspace disease consistent with worsening volume status. Trace bilateral pleural effusions. No pneumothorax. Postsurgical changes within the thoracic spine again noted. IMPRESSION: 1. Indeterminate positioning of the right internal jugular catheter, tip overlying the central mediastinum, unchanged since earlier exam. Intra arterial positioning of the catheter cannot be excluded, and correlation with catheter function is recommended. 2. Increasing vascular congestion and perihilar airspace disease, with trace bilateral pleural effusions, consistent with worsening volume status and pulmonary edema. Electronically Signed   By: Sharlet Salina M.D.   On: 07/20/2023 19:42   DG Chest Port 1 View  Result Date: 07/20/2023 CLINICAL DATA:  Cardiac arrest EXAM: PORTABLE CHEST 1 VIEW COMPARISON:  07/07/2022 FINDINGS: Endotracheal tube tip about 4.2 cm superior  to the carina. Right neck central catheter, indeterminate position of tip which projects over the tracheal column. There are small bilateral effusions. Upper normal cardiac size with aortic atherosclerosis. No pneumothorax IMPRESSION: 1. Endotracheal tube tip about 4.2 cm superior to the carina. 2. Right neck central catheter, indeterminate position of tip which projects over the tracheal column (?azygous? arterial). Repositioning is recommended 3. Small bilateral effusions. These results will be called to the ordering clinician or representative by the Radiologist Assistant, and communication documented in the PACS or [employer]. Electronically Signed   By: Jasmine Pang M.D.   On: 07/20/2023 17:18   DG UGI W SINGLE CM (SOL OR THIN BA)  Result Date: 07/12/2023 CLINICAL DATA:  History of gastric outlet obstruction, post surgical creation of a  gastrojejunostomy. EXAM: DG UGI W SINGLE CM TECHNIQUE: Single contrast examination was then performed using water-soluble contrast. FLUOROSCOPY: 2 minutes (33.5 mGy) COMPARISON:  CT abdomen and pelvis-07/01/2023 FINDINGS: Patient was positioned supine on the fluoroscopy table Preprocedural spot fluoroscopic image was obtained of the upper abdomen demonstrate enteric tube tip and side port project over the expected location of the gastric fundus. Midline skin staples. Water-soluble contrast was then administered via the nasogastric tube with opacification of the gastric lumen. There is a moderate-to-large amount of reflux noted to the level of the mid/distal esophagus. Ultimately, there is passage of contrast from the stomach lumen through the anastomosis with the opacification of the small bowel. Passage of contrast was improved by positioning on the fluoroscopy table at a 45 degree angle. No discrete areas of contrast extravasation are identified. There is minimal passage of contrast through the native gastric antrum to the level of the descending portion of the duodenum. IMPRESSION: 1. Patent gastrojejunostomy anastomosis without evidence of stricture or contrast extravasation. Passage of contrast through the anastomosis with sluggish though improved with positioning of the patient had a 45 degree angle. 2. Moderate-to-large amount of reflux to the level of the mid esophagus. Electronically Signed   By: Simonne Come M.D.   On: 07/12/2023 12:01   US Venous Img Upper Uni Left (DVT)  Result Date: 07/10/2023 CLINICAL DATA:  Left upper extremity pain and edema. Evaluate for DVT. EXAM: LEFT UPPER EXTREMITY VENOUS DOPPLER ULTRASOUND TECHNIQUE: Gray-scale sonography with graded compression, as well as color Doppler and duplex ultrasound were performed to evaluate the upper extremity deep venous system from the level of the subclavian vein and including the jugular, axillary, basilic, radial, ulnar and upper cephalic  vein. Spectral Doppler was utilized to evaluate flow at rest and with distal augmentation maneuvers. COMPARISON:  None Available. FINDINGS: Contralateral Subclavian Vein: Respiratory phasicity is normal and symmetric with the symptomatic side. No evidence of thrombus. Normal compressibility. Internal Jugular Vein: No evidence of thrombus. Normal compressibility, respiratory phasicity and response to augmentation. Subclavian Vein: No evidence of thrombus. Normal compressibility, respiratory phasicity and response to augmentation. Axillary Vein: No evidence of thrombus. Normal compressibility, respiratory phasicity and response to augmentation. Cephalic Vein: While the cephalic vein appears patent at the level of the left upper arm (image 23), there is hypoechoic occlusive thrombus within the cephalic vein at the level of the antecubital fossa (image 26). The cephalic vein appears patent at the level of the wrist (image 27). Basilic Vein: No evidence of thrombus. Normal compressibility, respiratory phasicity and response to augmentation. Brachial Veins: No evidence of thrombus. Normal compressibility, respiratory phasicity and response to augmentation. Radial Veins: No evidence of thrombus. Normal compressibility, respiratory phasicity and response to  augmentation. Ulnar Veins: No evidence of thrombus. Normal compressibility, respiratory phasicity and response to augmentation. Other Findings: There is a moderate amount of subcutaneous edema at the level of the left forearm (images 32 through 34). IMPRESSION: 1. Occlusive thrombus within the cephalic vein at the level of the antecubital fossa. 2. No evidence of DVT within the left upper extremity. Electronically Signed   By: Simonne Come M.D.   On: 07/10/2023 16:22   CT ABDOMEN PELVIS W CONTRAST  Result Date: 07/01/2023 CLINICAL DATA:  Abnormal bowel movements for 1 month, nausea, unintentional weight loss, prior history of lymphoma EXAM: CT ABDOMEN AND PELVIS WITH  CONTRAST TECHNIQUE: Multidetector CT imaging of the abdomen and pelvis was performed using the standard protocol following bolus administration of intravenous contrast. RADIATION DOSE REDUCTION: This exam was performed according to the departmental dose-optimization program which includes automated exposure control, adjustment of the mA and/or kV according to patient size and/or use of iterative reconstruction technique. CONTRAST:  OMNIPAQUE IOHEXOL 300 MG/ML  SOLN COMPARISON:  08/04/2019 FINDINGS: Lower chest: There are small bilateral pleural effusions. New bilateral pleural calcifications could reflect sequela of prior pleurodesis or trauma. No acute airspace disease. Chronic scarring at the right lung base. Hepatobiliary: No focal liver abnormality is seen. No gallstones, gallbladder wall thickening, or biliary dilatation. Pancreas: Unremarkable. No pancreatic ductal dilatation or surrounding inflammatory changes. Spleen: Normal in size without focal abnormality. Adrenals/Urinary Tract: Adrenal glands are unremarkable. Kidneys are normal, without renal calculi, focal lesion, or hydronephrosis. Bladder is unremarkable. Stomach/Bowel: There is irregular wall thickening of the gastric antrum and pylorus, measuring up to 19 mm in maximal thickness. This likely results in an element of gastric outlet obstruction, with distension of the proximal stomach. Differential would include gastric lymphoma versus adenocarcinoma. Endoscopy is recommended for further evaluation. No other signs of bowel obstruction or ileus. Mild fecal retention throughout the colon. Vascular/Lymphatic: Numerous subcentimeter lymph nodes surround the gastric antral wall thickening, measuring up to 9 mm in short axis reference image 37/2. Additionally, there are numerous mesenteric nodules throughout the right upper quadrant which could reflect additional mesenteric adenopathy versus peritoneal carcinomatosis. Largest area measures 2.4 x 2.9  cm reference image 39/2. There is diffuse atherosclerosis of the aorta and its branches. Incidental 1 cm aneurysm extending inferiorly from the main right renal artery, reference image 58/4, not significantly changed. Reproductive: Prostate is unremarkable. Fiduciary markers are seen within the prostate. Other: Trace free fluid within the lower pelvis. No free intraperitoneal gas. No abdominal wall hernia. Musculoskeletal: There are no acute or destructive bony abnormalities. Postsurgical changes are again noted within the midthoracic spine. Reconstructed images demonstrate no additional findings. IMPRESSION: 1. New irregular mural thickening throughout the gastric antrum, concerning for gastric carcinoma versus gastric lymphoma. Endoscopy is recommended for further evaluation. The mural thickening likely results in an element of gastric outlet obstruction, with distension of the proximal stomach. 2. Numerous subcentimeter lymph nodes surrounding the thickened gastric antrum, as well as numerous mesenteric nodules throughout the right upper quadrant, consistent with lymphadenopathy and carcinomatosis. 3. Small bilateral pleural effusions, with new pleural calcifications bilaterally likely sequela of interval pleurodesis or trauma. 4. 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 5.  Aortic Atherosclerosis (ICD10-I70.0). Electronically Signed   By: Sharlet Salina M.D.   On: 07/01/2023 20:12    ASSESSMENT & PLAN:   72 y.o. gentleman with history of DLBCL diagnosed in 2006, treated with R-CHOP with recurrence of lymphoma in 2007, s/p stem  cell transplant in May 2007 followed by right X. Mab maintenance completed in 2009.  Past medical history is also significant for hypertension, atrial flutter, diastolic heart failure, type 2 diabetes mellitus, prostate cancer s/p radiation treatments.   He was recently diagnosed with gastric adenocarcinoma after he presented to ER on 07/01/2023 with  complaints of nausea, vomiting, weight loss.  CT abdomen and pelvis on 07/01/2023 showed mural thickening throughout the stomach with possible gastric outlet obstruction, lymphadenopathy, mesenteric nodules concerning for carcinomatosis.  He underwent EGD on 07/02/2023 which showed gastric antral mass.  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  He was seen by our colleague Dr. Ellin Saba at [facility] during that hospitalization.  At that time recommendation was to get PET scan for staging and then consider systemic treatments.  Also recommended gastrojejunostomy for palliation given gastric outlet obstruction.  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  He presented to the hospital again on 07/19/2023 for upper GI bleeding in the setting of Eliquis for A-fib.  Initially admitted at [facility] where he was stabilized.  Underwent EGD which was complicated with V-fib arrest.  Patient was intubated and subsequently transferred to ICU at [facility].  Echocardiogram showed new systolic dysfunction with LVEF of 30 to 35%.  Patient was stabilized and extubated and subsequently transferred to the floor on 07/23/2023.  Our service was consulted today for management decisions, since patient's family members wanted to continue with aggressive treatment measures including systemic treatments for his gastric cancer.  Patient could not contribute to the history/interview today as he was very somnolent.  His wife was by the bedside and contributed to the discussion.  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  All treatment options would be palliative in nature and not curative in intent.  Given his recent issues, significant decline in overall health, performance status, I strongly recommended against systemic chemotherapy, as risks  significantly outweigh benefits.  Systemic chemotherapy is contraindicated with ECOG PS of at least 3 or worse.  Discussed best supportive care with option of hospice either at home or at nursing home.  Discussed what hospice would entail.  Recommended to reconsider his CODE STATUS and make him DNR.  Patient's wife stated that she will discuss with their kids and then make a decision.  She stated that she believes in miracles and has a strong faith in God and hoping that patient would make miraculous recovery.  I explained how less likely his medical turnaround is, given the circumstances and overall clinical picture but I am not sure if I could gain the trust of the patient's wife.  Rest of care as per primary team and other specialties.  Thanks for the opportunity to participate in the care of this patient. Please contact me if there are any questions.   Meryl Crutch, MD Medical Oncology and Hematology 07/25/2023 1:19 PM    This document was completed utilizing speech recognition software. Grammatical errors, random word insertions, pronoun errors, and incomplete sentences are an occasional consequence of this system due to software limitations, ambient noise, and hardware issues. Any formal questions or concerns about the content, text or information contained within the body of this dictation should be directly addressed to the provider for clarification.

## 2023-07-25 DEATH — deceased

## 2023-07-26 ENCOUNTER — Inpatient Hospital Stay: Payer: Medicare Other | Admitting: Hematology

## 2023-07-26 ENCOUNTER — Inpatient Hospital Stay (HOSPITAL_COMMUNITY): Payer: Medicare Other

## 2023-07-26 DIAGNOSIS — K921 Melena: Secondary | ICD-10-CM | POA: Diagnosis not present

## 2023-07-26 DIAGNOSIS — I469 Cardiac arrest, cause unspecified: Secondary | ICD-10-CM

## 2023-07-26 LAB — BASIC METABOLIC PANEL
Anion gap: 6 (ref 5–15)
BUN: 36 mg/dL — ABNORMAL HIGH (ref 8–23)
CO2: 28 mmol/L (ref 22–32)
Calcium: 7.5 mg/dL — ABNORMAL LOW (ref 8.9–10.3)
Chloride: 106 mmol/L (ref 98–111)
Creatinine, Ser: 2.15 mg/dL — ABNORMAL HIGH (ref 0.61–1.24)
GFR, Estimated: 32 mL/min — ABNORMAL LOW (ref >60)
Glucose, Bld: 540 mg/dL (ref 70–99)
Potassium: 3.1 mmol/L — ABNORMAL LOW (ref 3.5–5.1)
Sodium: 141 mmol/L (ref 135–145)

## 2023-07-26 LAB — CBC WITH DIFFERENTIAL/PLATELET
Abs Immature Granulocytes: 0.02 10*3/uL (ref 0.00–0.07)
Basophils Absolute: 0 10*3/uL (ref 0.0–0.1)
Basophils Relative: 0 %
Eosinophils Absolute: 0 10*3/uL (ref 0.0–0.5)
Eosinophils Relative: 1 %
HCT: 26.6 % — ABNORMAL LOW (ref 39.0–52.0)
Hemoglobin: 8.1 g/dL — ABNORMAL LOW (ref 13.0–17.0)
Immature Granulocytes: 1 %
Lymphocytes Relative: 7 %
Lymphs Abs: 0.3 10*3/uL — ABNORMAL LOW (ref 0.7–4.0)
MCH: 29.5 pg (ref 26.0–34.0)
MCHC: 30.5 g/dL (ref 30.0–36.0)
MCV: 96.7 fL (ref 80.0–100.0)
Monocytes Absolute: 0.5 10*3/uL (ref 0.1–1.0)
Monocytes Relative: 12 %
Neutro Abs: 3 10*3/uL (ref 1.7–7.7)
Neutrophils Relative %: 79 %
Platelets: 86 10*3/uL — ABNORMAL LOW (ref 150–400)
RBC: 2.75 MIL/uL — ABNORMAL LOW (ref 4.22–5.81)
RDW: 17.9 % — ABNORMAL HIGH (ref 11.5–15.5)
WBC: 3.8 10*3/uL — ABNORMAL LOW (ref 4.0–10.5)
nRBC: 0 % (ref 0.0–0.2)

## 2023-07-26 LAB — MAGNESIUM: Magnesium: 2.2 mg/dL (ref 1.7–2.4)

## 2023-07-26 LAB — GLUCOSE, CAPILLARY
Glucose-Capillary: 100 mg/dL — ABNORMAL HIGH (ref 70–99)
Glucose-Capillary: 110 mg/dL — ABNORMAL HIGH (ref 70–99)
Glucose-Capillary: 114 mg/dL — ABNORMAL HIGH (ref 70–99)
Glucose-Capillary: 115 mg/dL — ABNORMAL HIGH (ref 70–99)
Glucose-Capillary: 121 mg/dL — ABNORMAL HIGH (ref 70–99)
Glucose-Capillary: 124 mg/dL — ABNORMAL HIGH (ref 70–99)
Glucose-Capillary: 93 mg/dL (ref 70–99)

## 2023-07-26 LAB — BRAIN NATRIURETIC PEPTIDE: B Natriuretic Peptide: 351.2 pg/mL — ABNORMAL HIGH (ref 0.0–100.0)

## 2023-07-26 LAB — PHOSPHORUS: Phosphorus: 2.9 mg/dL (ref 2.5–4.6)

## 2023-07-26 MED ORDER — POTASSIUM CHLORIDE 10 MEQ/100ML IV SOLN
10.0000 meq | INTRAVENOUS | Status: AC
  Administered 2023-07-26 (×4): 10 meq via INTRAVENOUS
  Filled 2023-07-26 (×4): qty 100

## 2023-07-26 MED ORDER — POTASSIUM CHLORIDE 20 MEQ PO PACK: 40.0000 meq | PACK | Freq: Once | ORAL | Status: DC

## 2023-07-26 MED ORDER — FENTANYL CITRATE PF 50 MCG/ML IJ SOSY
12.5000 ug | PREFILLED_SYRINGE | Freq: Four times a day (QID) | INTRAMUSCULAR | Status: DC | PRN
  Administered 2023-07-26 – 2023-07-27 (×3): 12.5 ug via INTRAVENOUS
  Filled 2023-07-26 (×3): qty 1

## 2023-07-26 MED ORDER — POTASSIUM CHLORIDE 10 MEQ/100ML IV SOLN
10.0000 meq | INTRAVENOUS | Status: AC
  Administered 2023-07-26 (×2): 10 meq via INTRAVENOUS
  Filled 2023-07-26 (×2): qty 100

## 2023-07-26 MED ORDER — DEXTROSE 5 % IV SOLN: INTRAVENOUS | Status: AC

## 2023-07-26 NOTE — Progress Notes (Signed)
   07/26/23 1100  Spiritual Encounters  Type of Visit Initial  Care provided to: [facility] partners present during encounter Nurse  Referral source Clinical staff  Reason for visit Routine spiritual support  OnCall Visit No   Chaplain visited patient's spouse through referral from attending nurse. She is going through anticipatory grief and still holding on the hope that patient will get better. Family belongs to the [place] tradition and rely on her faith for strength and comfort. Chaplain asked guided questions to bring forth feelings and offered a word of prayer. No follow-up is needed at this time.

## 2023-07-26 NOTE — Progress Notes (Signed)
Heart Failure Navigator Progress Note  Assessed for Heart & Vascular TOC clinic readiness.  Patient does not meet criteria due ,. No TOC - poor prognosis and team recommending comfort measures, EF 30-35%.   Navigator will sign off at this time.   Rhae Hammock, BSN, Scientist, clinical (histocompatibility and immunogenetics) Only

## 2023-07-26 NOTE — Progress Notes (Signed)
Physical Therapy Treatment Patient Details Name: [name]. MRN: [id number] DOB: [birth year] Today's Date: 07/26/2023   History of Present Illness 72 y.o. male presents to outside hospital on 07/19/2023 with anemia and black stools. S/p EGD on 11/26 and experienced a Vfib arrest during procedure lasting 1 min with 2 rounds of CPR. Pt admitted to ICU for management of new HFrEF, RV systolic dysfunction, distributive/hemorrhagic shock, metabolic encephalopathy, and resp failure requiring intubation 11/26-11/27. Pt was recently hospitalized from 11/7-11/21 with gastric adenocarcinoma, undergoing gastrojejunostomy on 11/13. Additional PMH includes: aflutter, autoimmune hemolytic anemia, CKD III, diastolic HF, HTN, DM.    PT Comments  The pt was lethargic, but wife present and agreeable for PT to attempt session with goal of encouraging participation in ROM and exercises from bed-level. The pt was unable to assist with any repositioning in bed and unable to hold his head up when head of bed raised to 45 deg. The pt was able to complete small ROM for 1-2 reps with increased cues for movement at bilateral ankles and bilateral fingers/elbows, but movements at knee, hip, and shoulder were largely passive ROM at this time. The pt was able to state his name, his wifes name, and that we were at [facility], but reports most recent Holliday was "4th of July". OOB attempts deferred for today due to pt lethargy, will continue to need significant assistance and skilled PT to decrease caregiver burden. Will continue to follow acutely as it aligns with plan of care.    If plan is discharge home, recommend the following: Two people to help with walking and/or transfers;A lot of help with bathing/dressing/bathroom;Assistance with cooking/housework;Direct supervision/assist for medications management;Assist for transportation;Help with stairs or ramp for entrance;Supervision due to cognitive status   Can travel  by private vehicle     No  Equipment Recommendations  Other (comment) (Per accepting facility)    Recommendations for Other Services       Precautions / Restrictions Precautions Precautions: Fall Precaution Comments: watch HR Restrictions Weight Bearing Restrictions: No     Mobility  Bed Mobility Overal bed mobility: Needs Assistance             General bed mobility comments: totalA to reposition, pt dependent on posterior support of bed, unable to lift his head without assistance    Transfers                   General transfer comment: deferred due to lethargy and RN requesting to limit to bed-level exercises    Ambulation/Gait               General Gait Details: Unable     Balance Overall balance assessment: Needs assistance Sitting-balance support: Feet supported, No upper extremity supported Sitting balance-Leahy Scale: Zero Sitting balance - Comments: dependent on bed for support, unable to lift his head                                    Cognition Arousal: Lethargic Behavior During Therapy: Flat affect Overall Cognitive Status: Difficult to assess                                 General Comments: pt oriented to self, location ([facility]) but not to time (states most recent holliday was 4th of July). eyes closed for session until  directly cued to open then opened briefly. increased processing time.        Exercises General Exercises - Upper Extremity Shoulder Flexion: PROM, Right, 5 reps Elbow Flexion: AAROM, Right, 10 reps, Seated Elbow Extension: AROM, Right, 10 reps, Seated Digit Composite Flexion: AAROM, Both, 10 reps, Supine Composite Extension: AAROM, Both, 10 reps, Supine General Exercises - Lower Extremity Ankle Circles/Pumps: AAROM, Both, 10 reps, Supine Short Arc Quad: PROM, Both, 10 reps, Supine Heel Slides: PROM, Both, 10 reps, Supine Hip ABduction/ADduction: PROM, Both, 10 reps,  Supine    General Comments General comments (skin integrity, edema, etc.): HR to 105bpm, BP stable      Pertinent Vitals/Pain Pain Assessment Pain Assessment: Faces Faces Pain Scale: Hurts even more Pain Location: "everywhere" but especially neck and L arm Pain Descriptors / Indicators: Discomfort, Grimacing Pain Intervention(s): Limited activity within patient's tolerance, Monitored during session, Repositioned, Heat applied     PT Goals (current goals can now be found in the care plan section) Acute Rehab PT Goals Patient Stated Goal: per wife, to return home PT Goal Formulation: Patient unable to participate in goal setting Time For Goal Achievement: 08/06/23 Potential to Achieve Goals: Poor Progress towards PT goals: Not progressing toward goals - comment    Frequency    Min 1X/week       AM-PAC PT "6 Clicks" Mobility   Outcome Measure  Help needed turning from your back to your side while in a flat bed without using bedrails?: Total Help needed moving from lying on your back to sitting on the side of a flat bed without using bedrails?: Total Help needed moving to and from a bed to a chair (including a wheelchair)?: Total Help needed standing up from a chair using your arms (e.g., wheelchair or bedside chair)?: Total Help needed to walk in hospital room?: Total Help needed climbing 3-5 steps with a railing? : Total 6 Click Score: 6    End of Session Equipment Utilized During Treatment: Oxygen Activity Tolerance: Patient limited by lethargy;Patient limited by fatigue Patient left: with call bell/phone within reach;with family/visitor present;in bed;with bed alarm set Nurse Communication: Mobility status;Need for lift equipment PT Visit Diagnosis: Other abnormalities of gait and mobility (R26.89)     Time: 8841-6606 PT Time Calculation (min) (ACUTE ONLY): 32 min  Charges:    $Therapeutic Exercise: 23-37 mins PT General Charges $$ ACUTE PT VISIT: 1  Visit                     Vickki Muff, PT, DPT   Acute Rehabilitation Department Office [phone] Secure Chat Communication Preferred   Ronnie Derby 07/26/2023, 12:50 PM

## 2023-07-26 NOTE — Progress Notes (Signed)
Daily Progress Note   Patient Name: [name].       Date: 07/26/2023 DOB: 1951/02/25  Age: 72 y.o. MRN#: [id number] Attending Physician: Leroy Sea, MD Primary Care Physician: [name], MD Admit Date: 07/19/2023  Reason for Consultation/Follow-up: Establishing goals of care  Subjective: Medical records reviewed including progress notes, labs, imaging. Patient assessed at the bedside. He is quite lethargic, unable to keep his head up. His wife is present visiting. Discussed with RN and MD.  Created space and opportunity for wife's thoughts and feelings on patient's current illness, reviewing her recent conversations with the care team and ensuring that communication regarding the care plan, expectations, and prognosis has been well understood. She has difficulty recounting detailed information, stating "I couldn't tell you" and acknowledges that this has all been very taxing on her and her own health care complications. She understands that Square needs to have some strength to be able to use the provided medications and interventions for healing. She also understands that he is not strong enough for any chemotherapy. She continues to express her strong faith and belief that his healing has already been ordained by God. Emotional support and therapeutic listening was provided as she shared her journey with patient's worsening illness and various cancers since 2006. She states she has already spoken with chaplain today as well.  Patient's wife explains her belief that the physical body is only suffering what has already been experienced by Jesus on our behalf. She relies heavily on her faith in healing and worries about considering other options as being "contrary to the word of God." At  the same time, she would like to better understand the medical team's concerns and recommendations for DNR/comfort care. She requested a written resource and I provided her with [facility] [employer] booklet. She is agreeable to a follow up visit tomorrow to continue discussions after she has taken time to rest and review.  Questions and concerns addressed.  Provided with PMT contact information.   PMT will continue to support holistically.   Length of Stay: 7   Physical Exam Vitals and nursing note reviewed.  Constitutional:      General: He is not in acute distress.    Appearance: He is ill-appearing.     Interventions: Nasal cannula in place.     Comments: 2L [place]  Cardiovascular:     Rate and Rhythm: Normal rate.  Pulmonary:     Effort: Tachypnea present.  Neurological:     Mental Status: He is easily aroused. He is lethargic.            Vital Signs: BP 132/79 (BP Location: Right Arm)   Pulse (!) 106   Temp 97.7 F (36.5 C) (Axillary)   Resp (!) 29   Ht 5\' 11"  (1.803 m)   Wt 91.3 kg   SpO2 98%   BMI 28.07 kg/m  SpO2: SpO2: 98 % O2 Device: O2 Device: Nasal Cannula O2 Flow Rate: O2 Flow Rate (L/min): 2 L/min      Palliative Assessment/Data: 20%    Palliative Care Assessment & Plan   Patient Profile: 72 y.o. male  with past medical history of a  flutter, autoimmune hemolytic anemia, CKD 3, diastolic heart failure, HTN/HLD, newly diagnosed gastric mass, diabetes, recent hospital stay with discharged to [facility] for short-term rehab admitted on 07/19/2023 with GI bleed.  Hospitalization 11/7 - 11/21 for gastric outlet obstruction due to gastric adenocarcinoma with palliative gastrojejunostomy 11/13.  EGD revealed large circumferential fungating malignant gastric tumor.   Assessment: Goals of care conversation Distributive/hemorrhagic shock, resolved Status post in-hospital V-fib cardiac arrest postprocedure  Chronic HFrEF Acute hypoxic respiratory failure,  was extubated yesterday  AKI, creatinine worsening  Recommendations/Plan: Continue full code/full scope treatment Patient's wife remains hopeful and relies on her religious faith to guide medical decision-making process. She wishes to review additional resources today and is open to ongoing GOC discussions Psychosocial and emotional support provided PMT will continue to follow and support    Prognosis: Poor long-term prognosis  Discharge Planning: To Be Determined  Care plan was discussed with patient, patient's wife, MD, RN   MDM high         Adasyn Mcadams Jeni Salles, PA-C  Palliative Medicine Team Team phone # [phone]  Thank you for allowing the Palliative Medicine Team to assist in the care of this patient. Please utilize secure chat with additional questions, if there is no response within 30 minutes please call the above phone number.  Palliative Medicine Team providers are available by phone from 7am to 7pm daily and can be reached through the team cell phone.  Should this patient require assistance outside of these hours, please call the patient's attending physician.

## 2023-07-26 NOTE — Progress Notes (Signed)
PT Cancellation Note  Patient Details Name: [name]. MRN: [id number] DOB: [birth year]   Cancelled Treatment:    Reason Eval/Treat Not Completed: Medical issues which prohibited therapy this morning. Per discussion with RN, pt with elevated HR and irregular beats, the RN is hanging potassium and asked PT to check in later if pt more stable for mobility.   Vickki Muff, PT, DPT   Acute Rehabilitation Department Office [phone] Secure Chat Communication Preferred  Ronnie Derby 07/26/2023, 11:39 AM

## 2023-07-26 NOTE — Progress Notes (Signed)
PROGRESS NOTE        PATIENT DETAILS Name: [name]. Age: 72 y.o. Sex: male Date of Birth: 1951-07-12 Admit Date: 07/19/2023 Admitting Physician Ejiroghene Wendall Stade, MD ZOX:[id number], Lyman Bishop, MD  Brief Summary: Patient is a 72 y.o.  male who was recently diagnosed with gastric outlet obstruction  secondary to gastric adenocarcinoma-s/p palliative gastrojejunostomy on 11/13-presented to the hospital with upper GI bleeding in the setting of Eliquis for A-fib.  Initially admitted at [facility] he was stabilized-underwent EGD-which was complicated by V-fib arrest.  Patient was intubated and subsequently transferred to ICU here at [facility].  Echocardiogram showed new systolic dysfunction-patient was stabilized-extubated and subsequently transferred to [facility] on 11/29.  See below for further details.  Significant events: 11/25>> admit to [facility]-upper GI bleed-acute blood loss anemia 11/26>> EGD-active bleeding-successful hemostasis-but had V-fib arrest-intubated-transferred to ICU [facility]. 11/27>> extubated 11/29>> transferred to [facility].  Significant studies: 11/27>> EF 30-35%, RV systolic function moderately reduced.  RVSP 53.9.  Significant microbiology data: None  Procedures: 11/26>> VWU:JWJXB, fungating and ulcerated, circumferential mass with oozing bleeding and stigmata of recent bleeding was found in the gastric antrum.  Hemostatic spray applied. 11/26-11/27>> ETT  Consults: GI Palliative care PCCM Cardiology Oncology  Subjective:  Patient in bed, appears comfortable, denies any headache, no fever, no chest pain or pressure, no shortness of breath , no abdominal pain. No new focal weakness.   Objective: Vitals: Blood pressure 132/79, pulse (!) 106, temperature 97.7 F (36.5 C), temperature source Axillary, resp. rate (!) 29, height 5\' 11"  (1.803 m), weight 91.3 kg, SpO2 98%.   Exam:  Awake, no focal deficits, extremely frail and weak, weak cough  reflex [place].AT,PERRAL Supple Neck, No JVD,   Symmetrical Chest wall movement, Good air movement bilaterally, CTAB RRR,No Gallops, Rubs or new Murmurs,  +ve B.Sounds, Abd Soft, No tenderness,   No Cyanosis, Clubbing or edema   Assessment/Plan:  Upper GI bleeding with acute blood loss anemia Secondary to bleeding gastric malignancy-s/p EGD with hemostatic spray 11/26 Required 3 units of PRBC so far-last transfused 11/27 Type screen repeated again on 07/25/2023 after IV fluids expected to drop, will likely require more transfusions in the next few days.  Continue PPI IV.  Recent diagnosis of gastric outlet obstruction secondary to gastric adenocarcinoma-s/p palliative gastrojejunostomy on 11/13-Dr. Pappayliou at [facility] Follow with Dr. Frances Maywood at [facility], palliative care also on board but family and patient wish all aggressive treatment, in my opinion he is a candidate for medical treatment directed towards comfort, he should be DNR but family and patient resistant.    Case discussed with critical care Dr. Karie Fetch, oncologist Dr. Arlana Pouch & palliative care team in detail on 07/25/2023, all providers agree that patient has untreatable underlying condition with extremely poor prognosis, very poor functional status.  They all suggest pursuing comfort measures and hospice however patient's wife currently still wants to pursue aggressive care, does seem to have poor understanding of current situation.  Continue counseling.  Hypokalemia.  Replaced.    V-fib arrest Occurred during EGD 11/26 Echo with new onset HFrEF and RV systolic dysfunction, EF 35%. Neurology following poor candidate for invasive procedure or testing due to underlying recent diagnosis of stage IV gastric cancer, continue Coreg, cannot give aspirin or Plavix due to recent GI bleed from the gastric cancer.  Monitor with supportive care.  Combined  distributive/hemorrhagic shock Required pressors  briefly Resolved Transfuse as needed try to keep hemoglobin above 7.5, monitor CBC  Acute hypoxic respiratory failure in the setting of V-fib arrest Extubated 11/27 Currently on 2 L of oxygen Mobilize Pulmonary toileting  New diagnosis of HFrEF EF now 30 to 35%. On Coreg, not on ACE inhibitor, ARB or Entresto due to AKI, cardiology on board, limited options for treatment kindly see #1 above.  Try to give Coreg if he is able to swallow safely, keep electrolytes stable.  Chronic atrial fibrillation [place] vas 2 score of greater than 4. Telemetry monitoring Rate well-controlled off rate control agents Placed on oral Coreg and IV Lopressor for rate control, continue to monitor.  Superficial venous thrombus-left cephalic vein Supportive care Not a candidate for anticoagulation, poor candidate for it due to recent GI bleed from the recently diagnosed gastric cancer.  HTN BP soft but stable Watch closely.  AKI with hypernatremia He is dehydrated, D5W, oral diet, avoid nephrotoxins, continue Foley for now  Acute metabolic encephalopathy Multifactorial etiology-from critical illness-ICU delirium/AKI Moving all 4 extremities-somewhat redirectable Maintain delirium precautions.  Dysphagia.  Due to encephalopathy and severe deconditioning.  Unable to take oral medications or diet, clear liquid diet if tolerated, speech therapy, gentle D5, poor candidate for NG tube or PEG tube.    DM-2 (A1c 4.6 on 10/23) CBG stable on SSI  Recent Labs    07/26/23 0025 07/26/23 0614 07/26/23 0814  GLUCAP 100* 121* 110*    Palliative care Poor overall prognosis, Full code for now, Discussed with daughter/spouse at bedside-they understand tenuous situation-poor overall prognosis. Wife and patient want to pursue aggressive measures.  Palliative care, oncology and PCCM will be involved to opine.  Nutrition Status: Nutrition Problem: Moderate Malnutrition Etiology: acute illness (recently diagnosed  gastric cancer) Signs/Symptoms: mild muscle depletion, mild fat depletion Interventions: Ensure Enlive (each supplement provides 350kcal and 20 grams of protein), MVI  Pressure Ulcer: Agree with assessment and plan as outlined below Pressure Injury 07/20/23 Buttocks Left Stage 2 -  Partial thickness loss of dermis presenting as a shallow open injury with a red, pink wound bed without slough. (Active)  07/20/23 1845  Location: Buttocks  Location Orientation: Left  Staging: Stage 2 -  Partial thickness loss of dermis presenting as a shallow open injury with a red, pink wound bed without slough.  Wound Description (Comments):   Present on Admission: Yes  Dressing Type Foam - Lift dressing to assess site every shift 07/25/23 2000    BMI: Estimated body mass index is 28.07 kg/m as calculated from the following:   Height as of this encounter: 5\' 11"  (1.803 m).   Weight as of this encounter: 91.3 kg.   Code status:   Code Status: Full Code   DVT Prophylaxis: SCDs Start: 07/19/23 1926   Family Communication:   Spouse  at bedside on 07/25/2023, 07/26/2023  Called daughter [phone] on 07/26/2023 at 8:36 AM.  Message left   Disposition Plan: Status is: Inpatient Remains inpatient appropriate because: Severity of illness   Planned Discharge Destination:Skilled nursing facility   Diet: Diet Order             Diet clear liquid Room service appropriate? Yes; Fluid consistency: Thin  Diet effective now                     Antimicrobial agents: Anti-infectives (From admission, onward)    Start     Dose/Rate Route Frequency Ordered Stop  07/23/23 1845  cefTRIAXone (ROCEPHIN) 2 g in sodium chloride 0.9 % 100 mL IVPB        2 g 200 mL/hr over 30 Minutes Intravenous Every 24 hours 07/23/23 1756     07/23/23 1845  metroNIDAZOLE (FLAGYL) IVPB 500 mg        500 mg 100 mL/hr over 60 Minutes Intravenous Every 12 hours 07/23/23 1756          MEDICATIONS: Scheduled  Meds:  carvedilol  6.25 mg Oral BID WC   Chlorhexidine Gluconate Cloth  6 each Topical Daily   docusate  100 mg Oral BID   feeding supplement  1 Container Oral TID BM   insulin aspart  0-6 Units Subcutaneous TID WC   pantoprazole (PROTONIX) IV  40 mg Intravenous Q12H   Continuous Infusions:  cefTRIAXone (ROCEPHIN)  IV Stopped (07/25/23 1755)   dextrose 75 mL/hr at 07/26/23 0500   metronidazole 500 mg (07/26/23 0634)   potassium chloride     PRN Meds:.acetaminophen **OR** acetaminophen, ipratropium-albuterol, metoprolol tartrate, ondansetron **OR** ondansetron (ZOFRAN) IV, mouth rinse   I have personally reviewed following labs and imaging studies  LABORATORY DATA: CBC: Recent Labs  Lab 07/20/23 1507 07/20/23 1840 07/20/23 2218 07/21/23 0057 07/23/23 0411 07/23/23 1458 07/24/23 0408 07/25/23 0652 07/26/23 0441  WBC 8.5  --  6.7   < > 5.3 4.1 3.3* 3.3* 3.8*  NEUTROABS 5.0  --  5.8  --  4.6  --   --  2.9 3.0  HGB 9.9*   < > 8.3*   < > 7.4* 8.0* 8.0* 8.0* 8.1*  HCT 31.1*   < > 25.7*   < > 24.5* 26.5* 26.1* 26.6* 26.6*  MCV 97.2  --  91.1   < > 92.8 93.3 93.2 93.7 96.7  PLT 217  --  204   < > 107* 105* 100* 101* 86*   < > = values in this interval not displayed.    Basic Metabolic Panel: Recent Labs  Lab 07/22/23 0418 07/23/23 0411 07/24/23 0408 07/25/23 0652 07/26/23 0441  NA 142 145 148* 154* 141  K 4.7 4.3 4.0 3.5 3.1*  CL 110 111 111 114* 106  CO2 25 27 27 27 28   GLUCOSE 99 93 101* 86 540*  BUN 27* 33* 37* 41* 36*  CREATININE 1.88* 1.96* 2.24* 2.36* 2.15*  CALCIUM 8.3* 8.0* 8.4* 8.5* 7.5*  MG 2.8* 2.9* 2.4 2.4 2.2  PHOS 4.1 4.2  --  3.1 2.9    GFR: Estimated Creatinine Clearance: 36.4 mL/min (A) (by C-G formula based on SCr of 2.15 mg/dL (H)).  Liver Function Tests: Recent Labs  Lab 07/20/23 1507 07/22/23 0418 07/24/23 0408  AST 17  --  13*  ALT 13  --  10  ALKPHOS 52  --  50  BILITOT 1.1  --  1.0  PROT 5.6*  --  4.6*  ALBUMIN 2.8* 1.8* 1.9*    Urine analysis:    Component Value Date/Time   COLORURINE YELLOW 07/09/2023 1840   APPEARANCEUR HAZY (A) 07/09/2023 1840   APPEARANCEUR Clear 05/03/2023 1056   LABSPEC 1.020 07/09/2023 1840   PHURINE 5.0 07/09/2023 1840   GLUCOSEU NEGATIVE 07/09/2023 1840   HGBUR SMALL (A) 07/09/2023 1840   BILIRUBINUR NEGATIVE 07/09/2023 1840   BILIRUBINUR Negative 05/03/2023 1056   KETONESUR 5 (A) 07/09/2023 1840   PROTEINUR 100 (A) 07/09/2023 1840   NITRITE NEGATIVE 07/09/2023 1840   LEUKOCYTESUR NEGATIVE 07/09/2023 1840    Sepsis Labs: Lactic Acid,  Venous    Component Value Date/Time   LATICACIDVEN 1.0 09/06/2017 2047    MICROBIOLOGY: Recent Results (from the past 240 hour(s))  MRSA Next Gen by PCR, Nasal     Status: None   Collection Time: 07/20/23  6:07 PM   Specimen: Nasal Mucosa; Nasal Swab  Result Value Ref Range Status   MRSA by PCR Next Gen NOT DETECTED NOT DETECTED Final    Comment: (NOTE) The GeneXpert MRSA Assay (FDA approved for NASAL specimens only), is one component of a comprehensive MRSA colonization surveillance program. It is not intended to diagnose MRSA infection nor to guide or monitor treatment for MRSA infections. Test performance is not FDA approved in patients less than 66 years old. Performed at [facility], [address], [place], [place] [address]     RADIOLOGY STUDIES/RESULTS: DG Chest Port 1 View  Result Date: 07/26/2023 CLINICAL DATA:  Shortness of breath EXAM: PORTABLE CHEST 1 VIEW COMPARISON:  07/23/2023.  07/07/2022 FINDINGS: Normal heart size and mediastinal contours. Patchy opacity in the left mid and right lower lung, similar to 2023. Blunting of the lateral left costophrenic sulcus which could be pleural fluid or scarring. No pneumothorax. Borderline heart size is stable. Mediastinal contours distorted by leftward rotation. Midthoracic corpectomy. IMPRESSION: Pulmonary scarring without definite acute superimposed process when compared  to 2023. Cannot exclude infiltrate especially at the left mid lung. Electronically Signed   By: Tiburcio Pea M.D.   On: 07/26/2023 08:28     LOS: 7 days   Signature  -    Susa Raring M.D on 07/26/2023 at 8:36 AM   -  To page go to [email or web address]

## 2023-07-26 NOTE — Progress Notes (Signed)
Nutrition Follow-up  DOCUMENTATION CODES:   Non-severe (moderate) malnutrition in context of acute illness/injury  INTERVENTION:  Continue with current diet as tolerated. Continue  Boost Breeze po TID, each supplement provides 250 kcal and 9 grams of protein    NUTRITION DIAGNOSIS:   Moderate Malnutrition related to acute illness (recently diagnosed gastric cancer) as evidenced by mild muscle depletion, mild fat depletion.    GOAL:   Patient will meet greater than or equal to 90% of their needs    MONITOR:   Diet advancement  REASON FOR ASSESSMENT:   Malnutrition Screening Tool    ASSESSMENT:   72 yo male admitted with GI bleed. PMH includes gastric adenocarcinoma s/p recent palliative gastrojejunostomy, HTN, large B cell lymphoma, DM, HLD, iron deficiency anemia.  Continues to have declined oral intake. Per MD during rounds, case was discussed with critical care, oncologist, palliative on 12/01 all providers agree that pt is untreatable underlying conditions with extremely poor prognosis, very poor functional status with a suggestion of pursuing comfort measures. Wife is still wanting aggressive care.  Pt is not a good candidate for alternate feeding options. Nutrition support is at base line.  NUTRITION - FOCUSED PHYSICAL EXAM:  Flowsheet Row Most Recent Value  Orbital Region Mild depletion  Upper Arm Region Mild depletion  Thoracic and Lumbar Region No depletion  Buccal Region Mild depletion  Temple Region Mild depletion  Clavicle Bone Region Mild depletion  Clavicle and Acromion Bone Region Mild depletion  Scapular Bone Region Mild depletion  Dorsal Hand Mild depletion  Patellar Region Mild depletion  Anterior Thigh Region Mild depletion  Posterior Calf Region Mild depletion  Edema (RD Assessment) Mild  Hair Reviewed  Eyes Reviewed  Mouth Reviewed  Skin Reviewed  Nails Reviewed       Diet Order:   Diet Order             DIET SOFT Fluid  consistency: Thin  Diet effective now                   EDUCATION NEEDS:   No education needs have been identified at this time  Skin:  Skin Assessment: Skin Integrity Issues: Skin Integrity Issues:: Stage II Stage II: L buttocks  Last BM:  11/28  Height:   Ht Readings from Last 1 Encounters:  07/20/23 5\' 11"  (1.803 m)    Weight:   Wt Readings from Last 1 Encounters:  07/21/23 91.3 kg    Ideal Body Weight:  78.2 kg  BMI:  Body mass index is 28.07 kg/m.  Estimated Nutritional Needs:   Kcal:  2250-2500  Protein:  125-145 gm  Fluid:  2.3-2.5 L    Jamelle Haring RDN, LDN Clinical Dietitian  RDN pager # available on Amion

## 2023-07-26 NOTE — Care Management Important Message (Signed)
Important Message  Patient Details  Name: [name]. MRN: [id number] Date of Birth: 1950/09/14   Important Message Given:  Yes - Medicare IM     Dorena Bodo 07/26/2023, 3:55 PM

## 2023-07-26 NOTE — TOC Progression Note (Signed)
Transition of Care (TOC) - Progression Note    Patient Details  Name: [name]. MRN: [id number] Date of Birth: 02/24/1951  Transition of Care [facility]) CM/SW Contact  Mearl Latin, LCSW Phone Number: 07/26/2023, 2:57 PM  Clinical Narrative:    CSW continuing to follow.   Expected Discharge Plan: Skilled Nursing Facility Barriers to Discharge: Continued Medical Work up  Expected Discharge Plan and Services In-house Referral: Clinical Social Work   Post Acute Care Choice: Skilled Nursing Facility Living arrangements for the past 2 months: Skilled Nursing Facility                                       Social Determinants of Health (SDOH) Interventions SDOH Screenings   Food Insecurity: No Food Insecurity (07/19/2023)  Housing: Low Risk  (07/19/2023)  Transportation Needs: No Transportation Needs (07/19/2023)  Utilities: Not At Risk (07/19/2023)  Alcohol Screen: Low Risk  (06/28/2020)  Depression (PHQ2-9): Low Risk  (05/01/2020)  Financial Resource Strain: Low Risk  (06/28/2020)  Physical Activity: Inactive (06/28/2020)  Social Connections: Moderately Integrated (06/28/2020)  Stress: No Stress Concern Present (06/28/2020)  Tobacco Use: Medium Risk (07/20/2023)    Readmission Risk Interventions    07/20/2023    7:47 AM  Readmission Risk Prevention Plan  Transportation Screening Complete  HRI or Home Care Consult Complete  Social Work Consult for Recovery Care Planning/Counseling Complete  Palliative Care Screening Not Applicable  Medication Review Oceanographer) Complete

## 2023-07-26 NOTE — Progress Notes (Signed)
SLP Cancellation Note  Patient Details Name: [name]. MRN: [id number] DOB: 1951-04-06   Cancelled treatment:       Reason Eval/Treat Not Completed: Per prior SLP note (11/28), pt's intake was limited with frequent emesis PTA and he "is capable of taking sips of liquid, but not enough calories to address needs. He is at risk of regurgitation, vomiting and postprandial aspiration. SLP unlikely to be of great benefit in this situation. MD can advance or restrict diet based on pt's overall function, primary needs are medical in nature, no need for swallowing therapy". Recommend diet be advanced per MD discretion as pt is able to tolerate solids. Discussed with MD.    Gwynneth Aliment, M.A., CF-SLP Speech Language Pathology, Acute Rehabilitation Services  Secure Chat preferred [phone]  07/26/2023, 10:53 AM

## 2023-07-27 ENCOUNTER — Encounter: Payer: Medicare Other | Admitting: Surgery

## 2023-07-27 DIAGNOSIS — K921 Melena: Secondary | ICD-10-CM | POA: Diagnosis not present

## 2023-07-27 DIAGNOSIS — Z515 Encounter for palliative care: Secondary | ICD-10-CM | POA: Diagnosis not present

## 2023-07-27 DIAGNOSIS — Z66 Do not resuscitate: Secondary | ICD-10-CM

## 2023-07-27 DIAGNOSIS — R0682 Tachypnea, not elsewhere classified: Secondary | ICD-10-CM

## 2023-07-27 DIAGNOSIS — R638 Other symptoms and signs concerning food and fluid intake: Secondary | ICD-10-CM

## 2023-07-27 DIAGNOSIS — D649 Anemia, unspecified: Secondary | ICD-10-CM | POA: Diagnosis not present

## 2023-07-27 DIAGNOSIS — Z711 Person with feared health complaint in whom no diagnosis is made: Secondary | ICD-10-CM

## 2023-07-27 DIAGNOSIS — Z789 Other specified health status: Secondary | ICD-10-CM

## 2023-07-27 DIAGNOSIS — I480 Paroxysmal atrial fibrillation: Secondary | ICD-10-CM

## 2023-07-27 DIAGNOSIS — K922 Gastrointestinal hemorrhage, unspecified: Secondary | ICD-10-CM | POA: Diagnosis not present

## 2023-07-27 LAB — GLUCOSE, CAPILLARY
Glucose-Capillary: 91 mg/dL (ref 70–99)
Glucose-Capillary: 90 mg/dL (ref 70–99)
Glucose-Capillary: 121 mg/dL — ABNORMAL HIGH (ref 70–99)
Glucose-Capillary: 147 mg/dL — ABNORMAL HIGH (ref 70–99)
Glucose-Capillary: 123 mg/dL — ABNORMAL HIGH (ref 70–99)
Glucose-Capillary: 134 mg/dL — ABNORMAL HIGH (ref 70–99)

## 2023-07-27 LAB — BRAIN NATRIURETIC PEPTIDE: B Natriuretic Peptide: 195.9 pg/mL — ABNORMAL HIGH (ref 0.0–100.0)

## 2023-07-27 LAB — MAGNESIUM: Magnesium: 2.3 mg/dL (ref 1.7–2.4)

## 2023-07-27 LAB — CBC WITH DIFFERENTIAL/PLATELET
Abs Immature Granulocytes: 0.02 10*3/uL (ref 0.00–0.07)
Basophils Absolute: 0 10*3/uL (ref 0.0–0.1)
Basophils Relative: 0 %
Eosinophils Absolute: 0.1 10*3/uL (ref 0.0–0.5)
Eosinophils Relative: 2 %
HCT: 27.6 % — ABNORMAL LOW (ref 39.0–52.0)
Hemoglobin: 8.1 g/dL — ABNORMAL LOW (ref 13.0–17.0)
Immature Granulocytes: 0 %
Lymphocytes Relative: 7 %
Lymphs Abs: 0.3 10*3/uL — ABNORMAL LOW (ref 0.7–4.0)
MCH: 28 pg (ref 26.0–34.0)
MCHC: 29.3 g/dL — ABNORMAL LOW (ref 30.0–36.0)
MCV: 95.5 fL (ref 80.0–100.0)
Monocytes Absolute: 0.4 10*3/uL (ref 0.1–1.0)
Monocytes Relative: 9 %
Neutro Abs: 4 10*3/uL (ref 1.7–7.7)
Neutrophils Relative %: 82 %
Platelets: 83 10*3/uL — ABNORMAL LOW (ref 150–400)
RBC: 2.89 MIL/uL — ABNORMAL LOW (ref 4.22–5.81)
RDW: 17.4 % — ABNORMAL HIGH (ref 11.5–15.5)
WBC: 4.9 10*3/uL (ref 4.0–10.5)
nRBC: 0 % (ref 0.0–0.2)

## 2023-07-27 LAB — BASIC METABOLIC PANEL
Anion gap: 6 (ref 5–15)
BUN: 34 mg/dL — ABNORMAL HIGH (ref 8–23)
CO2: 29 mmol/L (ref 22–32)
Calcium: 8.2 mg/dL — ABNORMAL LOW (ref 8.9–10.3)
Chloride: 119 mmol/L — ABNORMAL HIGH (ref 98–111)
Creatinine, Ser: 2.02 mg/dL — ABNORMAL HIGH (ref 0.61–1.24)
GFR, Estimated: 35 mL/min — ABNORMAL LOW (ref >60)
Glucose, Bld: 95 mg/dL (ref 70–99)
Potassium: 3.8 mmol/L (ref 3.5–5.1)
Sodium: 154 mmol/L — ABNORMAL HIGH (ref 135–145)

## 2023-07-27 LAB — PHOSPHORUS: Phosphorus: 2.2 mg/dL — ABNORMAL LOW (ref 2.5–4.6)

## 2023-07-27 MED ORDER — HYDROMORPHONE HCL 1 MG/ML IJ SOLN
0.5000 mg | INTRAMUSCULAR | Status: DC | PRN
  Administered 2023-07-27: 0.5 mg via INTRAVENOUS
  Filled 2023-07-27: qty 0.5

## 2023-07-27 MED ORDER — POTASSIUM PHOSPHATES 15 MMOLE/5ML IV SOLN
30.0000 mmol | Freq: Once | INTRAVENOUS | Status: AC
  Administered 2023-07-27: 30 mmol via INTRAVENOUS
  Filled 2023-07-27: qty 10

## 2023-07-27 MED ORDER — DEXTROSE 5 % IV SOLN: INTRAVENOUS | Status: AC

## 2023-07-27 NOTE — Progress Notes (Signed)
OT Cancellation Note  Patient Details Name: [name]. MRN: [id number] DOB: [birth year]   Cancelled Treatment:    Reason Eval/Treat Not Completed: (P) Fatigue/lethargy limiting ability to participate, Pt had difficulty staying awake and participating after repeated attempts, will try again as able.  Alexis Goodell 07/27/2023, 2:08 PM

## 2023-07-27 NOTE — Progress Notes (Signed)
Daily Progress Note   Patient Name: [name].       Date: 07/27/2023 DOB: 10/27/1950  Age: 72 y.o. MRN#: [id number] Attending Physician: Leroy Sea, MD Primary Care Physician: [name], MD Admit Date: 07/19/2023  Reason for Consultation/Follow-up: Establishing goals of care  Subjective: I have reviewed medical records including EPIC notes, MAR, and labs. Received report from primary RN - no acute concerns. RN reports patient is lethargic, not following commands, non-verbal, not eating/drinking.  Went to visit patient at bedside - no family/visitors present. Patient is lying in bed asleep - did not attempt to wake to preserve comfort. No signs or non-verbal gestures of pain or discomfort noted. No respiratory distress or secretions; increased work of breathing noted. RR 32.  1:15 PM Called patient's spouse/[name] - emotional support provided. Therapeutic listening provided as she reflects on previous conversations with other providers, PMT provider yesterday, and their 26 year marriage. [name] indicates she is still processing the patient's current clinical status and diagnosis and continues to pray God will heal him. She does express after speaking with her children they are all on the same page that they do not want patient to suffer and are interested in maintaining his comfort while providing gentle treatments. She has read a couple of pages from the Hard Choices book provided to her yesterday and is hopeful to continue reading today.  Transition to full comfort care was again reviewed and recommended - [name] tells me "I can't give you an answer to that." She is not ready for patient's transition to full comfort care today. However, did discuss concern that patient  appears uncomfortable currently - reviewed assessment as noted above. Discussed starting medication to assist with discomfort while continuing current gentle treatments - she expresses being ok with dilaudid administration for pain/RR>25/dyspnea and expressed appreciation as she reiterated she didn't want him suffering.   [name] expressed concern about patient's poor oral intake - natural trajectory at end of life reviewed - she expressed understanding. Offered to review life expectancy - she declined.  Continued discussion regarding code status. [name] tells me she opts for initiation of DNR/DNI today. She "doesn't want any shocking or chest compressions that would crush his bones." She "doesn't want anything cruel to be done." [name] understands that he would not receive CPR, defibrillation, ACLS  medications, or intubation.   [name] is ok if I call her daughter to review information as noted above.  All questions and concerns addressed. Encouraged to call with questions and/or concerns. PMT number provided.  1:50 PM Attempted to call daughter/[name] - no answer - unable to leave VM.  Length of Stay: 8  Current Medications: Scheduled Meds:   carvedilol  6.25 mg Oral BID WC   Chlorhexidine Gluconate Cloth  6 each Topical Daily   docusate  100 mg Oral BID   feeding supplement  1 Container Oral TID BM   insulin aspart  0-6 Units Subcutaneous TID WC   pantoprazole (PROTONIX) IV  40 mg Intravenous Q12H    Continuous Infusions:  cefTRIAXone (ROCEPHIN)  IV 2 g (07/26/23 2000)   dextrose 110 mL/hr at 07/27/23 0807   metronidazole 500 mg (07/27/23 0548)   potassium PHOSPHATE IVPB (in mmol) 30 mmol (07/27/23 1011)    PRN Meds: acetaminophen **OR** acetaminophen, fentaNYL (SUBLIMAZE) injection, ipratropium-albuterol, metoprolol tartrate, ondansetron **OR** ondansetron (ZOFRAN) IV, mouth rinse  Physical Exam Vitals and nursing note reviewed.  Constitutional:      General: He is  not in acute distress.    Appearance: He is ill-appearing.  Pulmonary:     Effort: No respiratory distress.  Skin:    General: Skin is warm and dry.  Neurological:     Mental Status: He is lethargic.     Motor: Weakness present.             Vital Signs: BP 121/66 (BP Location: Right Arm)   Pulse (!) 109   Temp 98.6 F (37 C) (Axillary)   Resp 20   Ht 5\' 11"  (1.803 m)   Wt 91.5 kg   SpO2 91%   BMI 28.13 kg/m  SpO2: SpO2: 91 % O2 Device: O2 Device: Room Air O2 Flow Rate: O2 Flow Rate (L/min): 2 L/min  Intake/output summary:  Intake/Output Summary (Last 24 hours) at 07/27/2023 1255 Last data filed at 07/27/2023 0600 Gross per 24 hour  Intake 200 ml  Output 800 ml  Net -600 ml   LBM: Last BM Date : 07/24/23 Baseline Weight: Weight: 90.5 kg Most recent weight: Weight: 91.5 kg       Palliative Assessment/Data: PPS 10%      Patient Active Problem List   Diagnosis Date Noted   PAF (paroxysmal atrial fibrillation) (HCC) 07/27/2023   Cardiac arrest (HCC) 07/26/2023   Failure to thrive in adult 07/25/2023   ABLA (acute blood loss anemia) 07/20/2023   Acute post-hemorrhagic anemia 07/20/2023   GI bleed 07/19/2023   Superficial vein thrombosis 07/19/2023   Acute renal failure superimposed on stage 3a chronic kidney disease (HCC) 07/08/2023   Primary gastric signet ring cell carcinoma (HCC) 07/05/2023   Gastric mass 07/02/2023   Malnutrition of moderate degree 07/02/2023   Gastric outlet obstruction 07/01/2023   Aortic atherosclerosis (HCC) 08/27/2022   Malignant neoplasm of prostate (HCC) 06/22/2022   Elevated PSA 05/14/2022   Vitamin D deficiency 01/25/2020   Uncontrolled type 2 diabetes mellitus with hyperglycemia (HCC) 12/14/2019   Cellulitis of lower extremity 12/14/2019   Autoimmune hemolytic anemia due to IgG (HCC)    Symptomatic anemia    Anemia 08/02/2019   Macrocytic anemia 08/02/2019   Atrial flutter (HCC) 09/07/2017   Bilateral pneumonia 09/07/2017    Acute respiratory failure with hypoxia (HCC) 05/19/2017   AKI (acute kidney injury) (HCC)    Sepsis due to pneumonia (HCC) 05/18/2017   Type 2 diabetes  mellitus with complication, without long-term current use of insulin (HCC) 02/19/2017   Essential hypertension, benign 04/03/2015   IDA (iron deficiency anemia)    Mucosal abnormality of stomach    Hx of adenomatous colonic polyps 06/13/2012   Status post autologous bone marrow transplantation (HCC) 07/15/2011   Venous insufficiency 04/29/2011   DLBCL (diffuse large B cell lymphoma) (HCC) 02/28/2009   Mixed hyperlipidemia 02/28/2009   Class 2 severe obesity due to excess calories with serious comorbidity and body mass index (BMI) of 36.0 to 36.9 in adult [facility]) 02/28/2009   DIASTOLIC HEART FAILURE, CHRONIC 02/28/2009   EDEMA 02/28/2009    Palliative Care Assessment & Plan   Patient Profile: 72 y.o. male with past medical history of a flutter, autoimmune hemolytic anemia, CKD 3, diastolic heart failure, HTN/HLD, newly diagnosed gastric mass, diabetes, recent hospital stay with discharged to [facility] for short-term rehab admitted on 07/19/2023 with GI bleed. Hospitalization 11/7 - 11/21 for gastric outlet obstruction due to gastric adenocarcinoma with palliative gastrojejunostomy 11/13. EGD revealed large circumferential fungating malignant gastric tumor.   Assessment: Principal Problem:   GI bleed Active Problems:   DIASTOLIC HEART FAILURE, CHRONIC   Essential hypertension, benign   Type 2 diabetes mellitus with complication, without long-term current use of insulin (HCC)   Atrial flutter (HCC)   Gastric outlet obstruction   Malnutrition of moderate degree   Primary gastric signet ring cell carcinoma (HCC)   Superficial vein thrombosis   ABLA (acute blood loss anemia)   Acute post-hemorrhagic anemia   Failure to thrive in adult   Cardiac arrest (HCC)   PAF (paroxysmal atrial fibrillation) (HCC)  Concern about end of  life  Recommendations/Plan: Continue current gentle medical treatment Now DNR/DNI  Patient's wife is not yet ready for his transition to full comfort care; however, is open to focusing on his comfort while receiving current gentle treatments Started dilaudid q4h PRN severe pain/RR>25/dyspnea Recommend comfort care/hospice for this patient with stage IV gastric carcinoma, not a candidate for chemotherapy, and is approaching end of life if/when family agreeable PMT will continue to follow and support holistically  Goals of Care and Additional Recommendations: Limitations on Scope of Treatment: No Tracheostomy  Code Status:    Code Status Orders  (From admission, onward)           Start     Ordered   07/19/23 1610  Full code  Continuous       Question:  By:  Answer:  Consent: discussion documented in EHR   07/19/23 1611           Code Status History     Date Active Date Inactive Code Status Order ID Comments User Context   07/01/2023 2133 07/16/2023 0239 Full Code [id number]  Starleen Arms, MD ED   08/02/2019 1605 08/07/2019 1605 Full Code [id number]  Darlin Priestly, MD Inpatient   09/06/2017 2342 09/10/2017 1735 Full Code [id number]  Meredeth Ide, MD Inpatient   05/18/2017 2138 05/22/2017 0058 Full Code [id number]  Jonah Blue, MD Inpatient       Prognosis:  Days-week  Discharge Planning: Anticipate hospital death if family not agreeable to hospice care  Care plan was discussed with primary RN, patient's wife, Dr. Thedore Mins  Thank you for allowing the Palliative Medicine Team to assist in the care of this patient.   Total Time 50 minutes Prolonged Time Billed  no       Keyosha Tiedt Dwan Bolt, NP  Please contact Palliative Medicine Team  phone at [phone] for questions and concerns.   *Portions of this note are a verbal dictation therefore any spelling and/or grammatical errors are due to the "Dragon [employer] One" system interpretation.

## 2023-07-27 NOTE — Progress Notes (Signed)
PROGRESS NOTE        PATIENT DETAILS Name: [name]. Age: 72 y.o. Sex: male Date of Birth: Jan 22, 1951 Admit Date: 07/19/2023 Admitting Physician Ejiroghene Wendall Stade, MD WJX:BJYNW, Lyman Bishop, MD  Brief Summary: Patient is a 72 y.o.  male who was recently diagnosed with gastric outlet obstruction  secondary to gastric adenocarcinoma-s/p palliative gastrojejunostomy on 11/13-presented to the hospital with upper GI bleeding in the setting of Eliquis for A-fib.  Initially admitted at [facility] he was stabilized-underwent EGD-which was complicated by V-fib arrest.  Patient was intubated and subsequently transferred to ICU here at [facility].  Echocardiogram showed new systolic dysfunction-patient was stabilized-extubated and subsequently transferred to [facility] on 11/29.  See below for further details.  Significant events: 11/25>> admit to [facility]-upper GI bleed-acute blood loss anemia 11/26>> EGD-active bleeding-successful hemostasis-but had V-fib arrest-intubated-transferred to ICU [facility]. 11/27>> extubated 11/29>> transferred to [facility].  Significant studies: 11/27>> EF 30-35%, RV systolic function moderately reduced.  RVSP 53.9.  Significant microbiology data: None  Procedures: 11/26>> GNF:AOZHY, fungating and ulcerated, circumferential mass with oozing bleeding and stigmata of recent bleeding was found in the gastric antrum.  Hemostatic spray applied. 11/26-11/27>> ETT  Consults: GI Palliative care PCCM Cardiology Oncology  Subjective:  Patient in bed, appears comfortable but somnolent, very frail, denies any headache chest or abdominal pain.   Objective: Vitals: Blood pressure 122/68, pulse 98, temperature 98.5 F (36.9 C), temperature source Oral, resp. rate 20, height 5\' 11"  (1.803 m), weight 91.5 kg, SpO2 92%.   Exam:  Awake, no focal deficits, extremely frail and weak, weak cough reflex [place].AT,PERRAL Supple Neck, No JVD,   Symmetrical Chest wall  movement, Good air movement bilaterally, CTAB RRR,No Gallops, Rubs or new Murmurs,  +ve B.Sounds, Abd Soft, No tenderness,   No Cyanosis, Clubbing or edema   Assessment/Plan:  Upper GI bleeding with acute blood loss anemia Secondary to bleeding gastric malignancy-s/p EGD with hemostatic spray 11/26 Required 3 units of PRBC so far-last transfused 11/27 Type screen repeated again on 07/25/2023 after IV fluids expected to drop, will likely require more transfusions in the next few days.  Continue PPI IV.  Recent diagnosis of gastric outlet obstruction secondary to gastric adenocarcinoma-s/p palliative gastrojejunostomy on 11/13-Dr. Pappayliou at [facility] Follow with Dr. Frances Maywood at [facility], palliative care also on board but family and patient wish all aggressive treatment, in my opinion he is a candidate for medical treatment directed towards comfort, he should be DNR but family and patient resistant.    Case discussed with critical care Dr. Karie Fetch, oncologist Dr. Arlana Pouch & palliative care team in detail on 07/25/2023, all providers agree that patient has untreatable underlying condition with extremely poor prognosis, very poor functional status.  They all suggest pursuing comfort measures and hospice however patient's wife currently still wants to pursue aggressive care, does seem to have poor understanding of current situation.  Have a long discussion with patient's daughter on 07/27/2023 over the hospital room phone, she has a good understanding that her dad is not doing well and she will talk to her mother about future course of action, really told her that her dad/patient is currently dying and the best course of action is hospice.  Continue counseling.  Hypokalemia.  Replaced.    V-fib arrest Occurred during EGD 11/26 Echo with new onset HFrEF and RV systolic dysfunction, EF  35%. Neurology following poor candidate for invasive procedure or testing due to underlying recent  diagnosis of stage IV gastric cancer, continue Coreg, cannot give aspirin or Plavix due to recent GI bleed from the gastric cancer.  Monitor with supportive care.  Combined distributive/hemorrhagic shock Required pressors briefly Resolved Transfuse as needed try to keep hemoglobin above 7.5, monitor CBC  Acute hypoxic respiratory failure in the setting of V-fib arrest Extubated 11/27 Currently on 2 L of oxygen Mobilize Pulmonary toileting  New diagnosis of HFrEF EF now 30 to 35%. On Coreg, not on ACE inhibitor, ARB or Entresto due to AKI, cardiology on board, limited options for treatment kindly see #1 above.  Try to give Coreg if he is able to swallow safely, keep electrolytes stable.  Chronic atrial fibrillation [place] vas 2 score of greater than 4. Telemetry monitoring Rate well-controlled off rate control agents Placed on oral Coreg and IV Lopressor for rate control, continue to monitor.  Superficial venous thrombus-left cephalic vein Supportive care Not a candidate for anticoagulation, poor candidate for it due to recent GI bleed from the recently diagnosed gastric cancer.  HTN BP soft but stable Watch closely.  AKI with hypernatremia, dehydration due to poor oral intake. He is dehydrated, D5W, oral diet, avoid nephrotoxins, continue Foley for now.  Acute metabolic encephalopathy Multifactorial etiology-from critical illness-ICU delirium/AKI Moving all 4 extremities-somewhat redirectable Maintain delirium precautions.  Dysphagia.  Due to encephalopathy and severe deconditioning.  Unable to take oral medications or diet, clear liquid diet if tolerated, speech therapy, gentle D5, poor candidate for NG tube or PEG tube.    DM-2 (A1c 4.6 on 10/23) CBG stable on SSI  Recent Labs    07/26/23 2356 07/27/23 0341 07/27/23 0757  GLUCAP 93 91 90    Palliative care Poor overall prognosis, Full code for now, Discussed with daughter/spouse at bedside-they understand tenuous  situation-poor overall prognosis. Wife and patient want to pursue aggressive measures.  Palliative care, oncology and PCCM will be involved to opine.  Nutrition Status: Nutrition Problem: Moderate Malnutrition Etiology: acute illness (recently diagnosed gastric cancer) Signs/Symptoms: mild muscle depletion, mild fat depletion Interventions: Ensure Enlive (each supplement provides 350kcal and 20 grams of protein), MVI  Pressure Ulcer: Agree with assessment and plan as outlined below Pressure Injury 07/20/23 Buttocks Left Stage 2 -  Partial thickness loss of dermis presenting as a shallow open injury with a red, pink wound bed without slough. (Active)  07/20/23 1845  Location: Buttocks  Location Orientation: Left  Staging: Stage 2 -  Partial thickness loss of dermis presenting as a shallow open injury with a red, pink wound bed without slough.  Wound Description (Comments):   Present on Admission: Yes  Dressing Type Foam - Lift dressing to assess site every shift 07/26/23 2000    BMI: Estimated body mass index is 28.13 kg/m as calculated from the following:   Height as of this encounter: 5\' 11"  (1.803 m).   Weight as of this encounter: 91.5 kg.   Code status:   Code Status: Full Code   DVT Prophylaxis: SCDs Start: 07/19/23 1926   Family Communication:   Spouse  at bedside on 07/25/2023, 07/26/2023  Called daughter [phone] on 07/26/2023 at 8:36 AM.  Message left  Daughter bedside on 07/26/2023 in detail, she has a good understanding that her diet is not doing well and she will talk to her mother.     Disposition Plan: Status is: Inpatient Remains inpatient appropriate because: Severity of illness  Planned Discharge Destination:Skilled nursing facility   Diet: Diet Order             DIET SOFT Fluid consistency: Thin  Diet effective now                     Antimicrobial agents: Anti-infectives (From admission, onward)    Start     Dose/Rate Route  Frequency Ordered Stop   07/23/23 1845  cefTRIAXone (ROCEPHIN) 2 g in sodium chloride 0.9 % 100 mL IVPB        2 g 200 mL/hr over 30 Minutes Intravenous Every 24 hours 07/23/23 1756     07/23/23 1845  metroNIDAZOLE (FLAGYL) IVPB 500 mg        500 mg 100 mL/hr over 60 Minutes Intravenous Every 12 hours 07/23/23 1756          MEDICATIONS: Scheduled Meds:  carvedilol  6.25 mg Oral BID WC   Chlorhexidine Gluconate Cloth  6 each Topical Daily   docusate  100 mg Oral BID   feeding supplement  1 Container Oral TID BM   insulin aspart  0-6 Units Subcutaneous TID WC   pantoprazole (PROTONIX) IV  40 mg Intravenous Q12H   Continuous Infusions:  cefTRIAXone (ROCEPHIN)  IV 2 g (07/26/23 2000)   dextrose 110 mL/hr at 07/27/23 0807   metronidazole 500 mg (07/27/23 0548)   potassium PHOSPHATE IVPB (in mmol)     PRN Meds:.acetaminophen **OR** acetaminophen, fentaNYL (SUBLIMAZE) injection, ipratropium-albuterol, metoprolol tartrate, ondansetron **OR** ondansetron (ZOFRAN) IV, mouth rinse   I have personally reviewed following labs and imaging studies  LABORATORY DATA: CBC: Recent Labs  Lab 07/20/23 2218 07/21/23 0057 07/23/23 0411 07/23/23 1458 07/24/23 0408 07/25/23 0652 07/26/23 0441 07/27/23 0503  WBC 6.7   < > 5.3 4.1 3.3* 3.3* 3.8* 4.9  NEUTROABS 5.8  --  4.6  --   --  2.9 3.0 4.0  HGB 8.3*   < > 7.4* 8.0* 8.0* 8.0* 8.1* 8.1*  HCT 25.7*   < > 24.5* 26.5* 26.1* 26.6* 26.6* 27.6*  MCV 91.1   < > 92.8 93.3 93.2 93.7 96.7 95.5  PLT 204   < > 107* 105* 100* 101* 86* 83*   < > = values in this interval not displayed.    Basic Metabolic Panel: Recent Labs  Lab 07/22/23 0418 07/23/23 0411 07/24/23 0408 07/25/23 0652 07/26/23 0441 07/27/23 0503  NA 142 145 148* 154* 141 154*  K 4.7 4.3 4.0 3.5 3.1* 3.8  CL 110 111 111 114* 106 119*  CO2 25 27 27 27 28 29   GLUCOSE 99 93 101* 86 540* 95  BUN 27* 33* 37* 41* 36* 34*  CREATININE 1.88* 1.96* 2.24* 2.36* 2.15* 2.02*  CALCIUM  8.3* 8.0* 8.4* 8.5* 7.5* 8.2*  MG 2.8* 2.9* 2.4 2.4 2.2 2.3  PHOS 4.1 4.2  --  3.1 2.9 2.2*    GFR: Estimated Creatinine Clearance: 38.8 mL/min (A) (by C-G formula based on SCr of 2.02 mg/dL (H)).  Liver Function Tests: Recent Labs  Lab 07/20/23 1507 07/22/23 0418 07/24/23 0408  AST 17  --  13*  ALT 13  --  10  ALKPHOS 52  --  50  BILITOT 1.1  --  1.0  PROT 5.6*  --  4.6*  ALBUMIN 2.8* 1.8* 1.9*   Urine analysis:    Component Value Date/Time   COLORURINE YELLOW 07/09/2023 1840   APPEARANCEUR HAZY (A) 07/09/2023 1840   APPEARANCEUR Clear 05/03/2023 1056  LABSPEC 1.020 07/09/2023 1840   PHURINE 5.0 07/09/2023 1840   GLUCOSEU NEGATIVE 07/09/2023 1840   HGBUR SMALL (A) 07/09/2023 1840   BILIRUBINUR NEGATIVE 07/09/2023 1840   BILIRUBINUR Negative 05/03/2023 1056   KETONESUR 5 (A) 07/09/2023 1840   PROTEINUR 100 (A) 07/09/2023 1840   NITRITE NEGATIVE 07/09/2023 1840   LEUKOCYTESUR NEGATIVE 07/09/2023 1840    Sepsis Labs: Lactic Acid, Venous    Component Value Date/Time   LATICACIDVEN 1.0 09/06/2017 2047    MICROBIOLOGY: Recent Results (from the past 240 hour(s))  MRSA Next Gen by PCR, Nasal     Status: None   Collection Time: 07/20/23  6:07 PM   Specimen: Nasal Mucosa; Nasal Swab  Result Value Ref Range Status   MRSA by PCR Next Gen NOT DETECTED NOT DETECTED Final    Comment: (NOTE) The GeneXpert MRSA Assay (FDA approved for NASAL specimens only), is one component of a comprehensive MRSA colonization surveillance program. It is not intended to diagnose MRSA infection nor to guide or monitor treatment for MRSA infections. Test performance is not FDA approved in patients less than 11 years old. Performed at [facility], [address], [place], [place] [address]     RADIOLOGY STUDIES/RESULTS: DG Chest Port 1 View  Result Date: 07/26/2023 CLINICAL DATA:  Shortness of breath EXAM: PORTABLE CHEST 1 VIEW COMPARISON:  07/23/2023.  07/07/2022 FINDINGS:  Normal heart size and mediastinal contours. Patchy opacity in the left mid and right lower lung, similar to 2023. Blunting of the lateral left costophrenic sulcus which could be pleural fluid or scarring. No pneumothorax. Borderline heart size is stable. Mediastinal contours distorted by leftward rotation. Midthoracic corpectomy. IMPRESSION: Pulmonary scarring without definite acute superimposed process when compared to 2023. Cannot exclude infiltrate especially at the left mid lung. Electronically Signed   By: Tiburcio Pea M.D.   On: 07/26/2023 08:28     LOS: 8 days   Signature  -    Susa Raring M.D on 07/27/2023 at 9:17 AM   -  To page go to [email or web address]

## 2023-07-28 DIAGNOSIS — D649 Anemia, unspecified: Secondary | ICD-10-CM | POA: Diagnosis not present

## 2023-07-28 DIAGNOSIS — K921 Melena: Secondary | ICD-10-CM | POA: Diagnosis not present

## 2023-07-28 DIAGNOSIS — Z515 Encounter for palliative care: Secondary | ICD-10-CM | POA: Diagnosis not present

## 2023-07-28 DIAGNOSIS — K922 Gastrointestinal hemorrhage, unspecified: Secondary | ICD-10-CM | POA: Diagnosis not present

## 2023-07-28 DIAGNOSIS — I5021 Acute systolic (congestive) heart failure: Secondary | ICD-10-CM | POA: Diagnosis not present

## 2023-07-28 LAB — CBC WITH DIFFERENTIAL/PLATELET
Abs Immature Granulocytes: 0.03 10*3/uL (ref 0.00–0.07)
Basophils Absolute: 0 10*3/uL (ref 0.0–0.1)
Basophils Relative: 0 %
Eosinophils Absolute: 0.2 10*3/uL (ref 0.0–0.5)
Eosinophils Relative: 3 %
HCT: 25.8 % — ABNORMAL LOW (ref 39.0–52.0)
Hemoglobin: 7.8 g/dL — ABNORMAL LOW (ref 13.0–17.0)
Immature Granulocytes: 1 %
Lymphocytes Relative: 7 %
Lymphs Abs: 0.3 10*3/uL — ABNORMAL LOW (ref 0.7–4.0)
MCH: 29.3 pg (ref 26.0–34.0)
MCHC: 30.2 g/dL (ref 30.0–36.0)
MCV: 97 fL (ref 80.0–100.0)
Monocytes Absolute: 0.5 10*3/uL (ref 0.1–1.0)
Monocytes Relative: 10 %
Neutro Abs: 4 10*3/uL (ref 1.7–7.7)
Neutrophils Relative %: 79 %
Platelets: 86 10*3/uL — ABNORMAL LOW (ref 150–400)
RBC: 2.66 MIL/uL — ABNORMAL LOW (ref 4.22–5.81)
RDW: 17.6 % — ABNORMAL HIGH (ref 11.5–15.5)
WBC: 5 10*3/uL (ref 4.0–10.5)
nRBC: 0 % (ref 0.0–0.2)

## 2023-07-28 LAB — BASIC METABOLIC PANEL
Anion gap: 5 (ref 5–15)
BUN: 29 mg/dL — ABNORMAL HIGH (ref 8–23)
CO2: 29 mmol/L (ref 22–32)
Calcium: 7.7 mg/dL — ABNORMAL LOW (ref 8.9–10.3)
Chloride: 114 mmol/L — ABNORMAL HIGH (ref 98–111)
Creatinine, Ser: 1.76 mg/dL — ABNORMAL HIGH (ref 0.61–1.24)
GFR, Estimated: 41 mL/min — ABNORMAL LOW (ref >60)
Glucose, Bld: 136 mg/dL — ABNORMAL HIGH (ref 70–99)
Potassium: 3.7 mmol/L (ref 3.5–5.1)
Sodium: 148 mmol/L — ABNORMAL HIGH (ref 135–145)

## 2023-07-28 LAB — GLUCOSE, CAPILLARY
Glucose-Capillary: 133 mg/dL — ABNORMAL HIGH (ref 70–99)
Glucose-Capillary: 122 mg/dL — ABNORMAL HIGH (ref 70–99)
Glucose-Capillary: 115 mg/dL — ABNORMAL HIGH (ref 70–99)
Glucose-Capillary: 122 mg/dL — ABNORMAL HIGH (ref 70–99)
Glucose-Capillary: 115 mg/dL — ABNORMAL HIGH (ref 70–99)

## 2023-07-28 LAB — MAGNESIUM: Magnesium: 2.1 mg/dL (ref 1.7–2.4)

## 2023-07-28 LAB — BRAIN NATRIURETIC PEPTIDE: B Natriuretic Peptide: 224.1 pg/mL — ABNORMAL HIGH (ref 0.0–100.0)

## 2023-07-28 LAB — PHOSPHORUS: Phosphorus: 3.5 mg/dL (ref 2.5–4.6)

## 2023-07-28 MED ORDER — DEXTROSE 5 % IV SOLN: INTRAVENOUS | Status: AC

## 2023-07-28 NOTE — Progress Notes (Signed)
PROGRESS NOTE        PATIENT DETAILS Name: [name]. Age: 72 y.o. Sex: male Date of Birth: 12-05-1950 Admit Date: 07/19/2023 Admitting Physician Ejiroghene Wendall Stade, MD OZH:YQMVH, Lyman Bishop, MD  Brief Summary: Patient is a 72 y.o.  male who was recently diagnosed with gastric outlet obstruction  secondary to gastric adenocarcinoma-s/p palliative gastrojejunostomy on 11/13-presented to the hospital with upper GI bleeding in the setting of Eliquis for A-fib.  Initially admitted at [facility] he was stabilized-underwent EGD-which was complicated by V-fib arrest.  Patient was intubated and subsequently transferred to ICU here at [facility].  Echocardiogram showed new systolic dysfunction-patient was stabilized-extubated and subsequently transferred to [facility] on 11/29.  See below for further details.  Significant events: 11/25>> admit to [facility]-upper GI bleed-acute blood loss anemia 11/26>> EGD-active bleeding-successful hemostasis-but had V-fib arrest-intubated-transferred to ICU [facility]. 11/27>> extubated 11/29>> transferred to [facility].  Significant studies: 11/27>> EF 30-35%, RV systolic function moderately reduced.  RVSP 53.9.  Significant microbiology data: None  Procedures: 11/26>> QIO:NGEXB, fungating and ulcerated, circumferential mass with oozing bleeding and stigmata of recent bleeding was found in the gastric antrum.  Hemostatic spray applied. 11/26-11/27>> ETT  Consults: GI Palliative care PCCM Cardiology Oncology  Subjective:  Patient in bed, appears comfortable but somnolent, very frail, denies any headache chest or abdominal pain.   Objective: Vitals: Blood pressure 122/65, pulse 100, temperature 98 F (36.7 C), temperature source Oral, resp. rate 20, height 5\' 11"  (1.803 m), weight 91.5 kg, SpO2 100%.   Exam:  Awake, no focal deficits, extremely frail and weak, weak cough reflex [name],PERRAL Supple Neck, No JVD,   Symmetrical Chest wall  movement, Good air movement bilaterally, CTAB RRR,No Gallops, Rubs or new Murmurs,  +ve B.Sounds, Abd Soft, No tenderness,   No Cyanosis, Clubbing or edema   Assessment/Plan:  Upper GI bleeding with acute blood loss anemia Secondary to bleeding gastric malignancy-s/p EGD with hemostatic spray 11/26 Required 3 units of PRBC so far-last transfused 11/27 Type screen repeated again on 07/25/2023 after IV fluids expected to drop, will likely require more transfusions in the next few days.  Continue PPI IV.  Recent diagnosis of gastric outlet obstruction secondary to gastric adenocarcinoma-s/p palliative gastrojejunostomy on 11/13-Dr. Pappayliou at [facility] Follow with Dr. Frances Maywood at [facility], palliative care also on board but family and patient wish all aggressive treatment, in my opinion he is a candidate for medical treatment directed towards comfort, he should be DNR but family and patient resistant.    Case discussed with critical care Dr. Karie Fetch, oncologist Dr. Arlana Pouch & palliative care team in detail on 07/25/2023, all providers agree that patient has untreatable underlying condition with extremely poor prognosis, very poor functional status.  They all suggest pursuing comfort measures and hospice after much consultation wife is agreed for DNR DNI on 07/27/2023, continue medical treatment otherwise.  Have a long discussion with patient's daughter on 07/27/2023 over the hospital room phone, she has a good understanding that her dad is not doing well and she will talk to her mother about future course of action, really told her that her dad/patient is currently dying and the best course of action is hospice.  Continue counseling.  Hypokalemia.  Replaced.    Extremely poor oral intake, dysphagia, frail status, unable to participate with PT OT, dehydration and hypernatremia.  Again supportive care,  D5W and monitor.  Prognosis very poor.  Patient therapy following.  Remains at risk  for aspiration.  V-fib arrest Occurred during EGD 11/26 Echo with new onset HFrEF and RV systolic dysfunction, EF 35%. Neurology following poor candidate for invasive procedure or testing due to underlying recent diagnosis of stage IV gastric cancer, continue Coreg, cannot give aspirin or Plavix due to recent GI bleed from the gastric cancer.  Monitor with supportive care.  Combined distributive/hemorrhagic shock Required pressors briefly Resolved Transfuse as needed try to keep hemoglobin above 7.5, monitor CBC  Acute hypoxic respiratory failure in the setting of V-fib arrest Extubated 11/27 Currently on 2 L of oxygen Mobilize Pulmonary toileting  New diagnosis of HFrEF EF now 30 to 35%. On Coreg, not on ACE inhibitor, ARB or Entresto due to AKI, cardiology on board, limited options for treatment kindly see #1 above.  Try to give Coreg if he is able to swallow safely, keep electrolytes stable.  Chronic atrial fibrillation [place] vas 2 score of greater than 4. Telemetry monitoring Rate well-controlled off rate control agents Placed on oral Coreg and IV Lopressor for rate control, continue to monitor.  Superficial venous thrombus-left cephalic vein Supportive care Not a candidate for anticoagulation, poor candidate for it due to recent GI bleed from the recently diagnosed gastric cancer.  HTN BP soft but stable Watch closely.  AKI with hypernatremia, dehydration due to poor oral intake. He is dehydrated, D5W, oral diet, avoid nephrotoxins, continue Foley for now.  Acute metabolic encephalopathy Multifactorial etiology-from critical illness-ICU delirium/AKI Moving all 4 extremities-somewhat redirectable Maintain delirium precautions.  Dysphagia.  Due to encephalopathy and severe deconditioning.  Unable to take oral medications or diet, clear liquid diet if tolerated, speech therapy, gentle D5, poor candidate for NG tube or PEG tube.    DM-2 (A1c 4.6 on 10/23) CBG stable on  SSI  Recent Labs    07/27/23 2327 07/28/23 0345 07/28/23 0805  GLUCAP 134* 133* 122*    Palliative care Poor overall prognosis, Full code for now, Discussed with daughter/spouse at bedside-they understand tenuous situation-poor overall prognosis. Wife and patient want to pursue aggressive measures.  Palliative care, oncology and PCCM will be involved to opine.  Nutrition Status: Nutrition Problem: Moderate Malnutrition Etiology: acute illness (recently diagnosed gastric cancer) Signs/Symptoms: mild muscle depletion, mild fat depletion Interventions: Ensure Enlive (each supplement provides 350kcal and 20 grams of protein), MVI  Pressure Ulcer: Agree with assessment and plan as outlined below Pressure Injury 07/20/23 Buttocks Left Stage 2 -  Partial thickness loss of dermis presenting as a shallow open injury with a red, pink wound bed without slough. (Active)  07/20/23 1845  Location: Buttocks  Location Orientation: Left  Staging: Stage 2 -  Partial thickness loss of dermis presenting as a shallow open injury with a red, pink wound bed without slough.  Wound Description (Comments):   Present on Admission: Yes  Dressing Type Foam - Lift dressing to assess site every shift 07/28/23 0830    BMI: Estimated body mass index is 28.13 kg/m as calculated from the following:   Height as of this encounter: 5\' 11"  (1.803 m).   Weight as of this encounter: 91.5 kg.   Code status:   Code Status: Limited: Do not attempt resuscitation (DNR) -DNR-LIMITED -Do Not Intubate/DNI    DVT Prophylaxis: SCDs Start: 07/19/23 1926   Family Communication:   Spouse  at bedside on 07/25/2023, 07/26/2023  Called daughter [phone] on 07/26/2023 at 8:36 AM.  Message left  Daughter bedside on 07/26/2023 in detail, she has a good understanding that her diet is not doing well and she will talk to her mother.   Disposition Plan: Status is: Inpatient Remains inpatient appropriate because: Severity of  illness   Planned Discharge Destination:Skilled nursing facility   Diet: Diet Order             DIET SOFT Fluid consistency: Thin  Diet effective now                     Antimicrobial agents: Anti-infectives (From admission, onward)    Start     Dose/Rate Route Frequency Ordered Stop   07/23/23 1845  cefTRIAXone (ROCEPHIN) 2 g in sodium chloride 0.9 % 100 mL IVPB        2 g 200 mL/hr over 30 Minutes Intravenous Every 24 hours 07/23/23 1756     07/23/23 1845  metroNIDAZOLE (FLAGYL) IVPB 500 mg        500 mg 100 mL/hr over 60 Minutes Intravenous Every 12 hours 07/23/23 1756          MEDICATIONS: Scheduled Meds:  carvedilol  6.25 mg Oral BID WC   Chlorhexidine Gluconate Cloth  6 each Topical Daily   docusate  100 mg Oral BID   feeding supplement  1 Container Oral TID BM   insulin aspart  0-6 Units Subcutaneous TID WC   pantoprazole (PROTONIX) IV  40 mg Intravenous Q12H   Continuous Infusions:  cefTRIAXone (ROCEPHIN)  IV 2 g (07/27/23 1710)   dextrose     metronidazole 500 mg (07/28/23 0614)   PRN Meds:.acetaminophen **OR** acetaminophen, fentaNYL (SUBLIMAZE) injection, HYDROmorphone (DILAUDID) injection, ipratropium-albuterol, metoprolol tartrate, ondansetron **OR** ondansetron (ZOFRAN) IV, mouth rinse   I have personally reviewed following labs and imaging studies  LABORATORY DATA: CBC: Recent Labs  Lab 07/23/23 0411 07/23/23 1458 07/24/23 0408 07/25/23 0652 07/26/23 0441 07/27/23 0503 07/28/23 0438  WBC 5.3   < > 3.3* 3.3* 3.8* 4.9 5.0  NEUTROABS 4.6  --   --  2.9 3.0 4.0 4.0  HGB 7.4*   < > 8.0* 8.0* 8.1* 8.1* 7.8*  HCT 24.5*   < > 26.1* 26.6* 26.6* 27.6* 25.8*  MCV 92.8   < > 93.2 93.7 96.7 95.5 97.0  PLT 107*   < > 100* 101* 86* 83* 86*   < > = values in this interval not displayed.    Basic Metabolic Panel: Recent Labs  Lab 07/23/23 0411 07/24/23 0408 07/25/23 0652 07/26/23 0441 07/27/23 0503 07/28/23 0438  NA 145 148* 154* 141 154*  148*  K 4.3 4.0 3.5 3.1* 3.8 3.7  CL 111 111 114* 106 119* 114*  CO2 27 27 27 28 29 29   GLUCOSE 93 101* 86 540* 95 136*  BUN 33* 37* 41* 36* 34* 29*  CREATININE 1.96* 2.24* 2.36* 2.15* 2.02* 1.76*  CALCIUM 8.0* 8.4* 8.5* 7.5* 8.2* 7.7*  MG 2.9* 2.4 2.4 2.2 2.3 2.1  PHOS 4.2  --  3.1 2.9 2.2* 3.5    GFR: Estimated Creatinine Clearance: 44.5 mL/min (A) (by C-G formula based on SCr of 1.76 mg/dL (H)).  Liver Function Tests: Recent Labs  Lab 07/22/23 0418 07/24/23 0408  AST  --  13*  ALT  --  10  ALKPHOS  --  50  BILITOT  --  1.0  PROT  --  4.6*  ALBUMIN 1.8* 1.9*   Urine analysis:    Component Value Date/Time   COLORURINE YELLOW 07/09/2023 1840  APPEARANCEUR HAZY (A) 07/09/2023 1840   APPEARANCEUR Clear 05/03/2023 1056   LABSPEC 1.020 07/09/2023 1840   PHURINE 5.0 07/09/2023 1840   GLUCOSEU NEGATIVE 07/09/2023 1840   HGBUR SMALL (A) 07/09/2023 1840   BILIRUBINUR NEGATIVE 07/09/2023 1840   BILIRUBINUR Negative 05/03/2023 1056   KETONESUR 5 (A) 07/09/2023 1840   PROTEINUR 100 (A) 07/09/2023 1840   NITRITE NEGATIVE 07/09/2023 1840   LEUKOCYTESUR NEGATIVE 07/09/2023 1840    Sepsis Labs: Lactic Acid, Venous    Component Value Date/Time   LATICACIDVEN 1.0 09/06/2017 2047    MICROBIOLOGY: Recent Results (from the past 240 hour(s))  MRSA Next Gen by PCR, Nasal     Status: None   Collection Time: 07/20/23  6:07 PM   Specimen: Nasal Mucosa; Nasal Swab  Result Value Ref Range Status   MRSA by PCR Next Gen NOT DETECTED NOT DETECTED Final    Comment: (NOTE) The GeneXpert MRSA Assay (FDA approved for NASAL specimens only), is one component of a comprehensive MRSA colonization surveillance program. It is not intended to diagnose MRSA infection nor to guide or monitor treatment for MRSA infections. Test performance is not FDA approved in patients less than 24 years old. Performed at [facility], [address], [place], [place] [address]     RADIOLOGY  STUDIES/RESULTS: No results found.   LOS: 9 days   Signature  -    Susa Raring M.D on 07/28/2023 at 11:02 AM   -  To page go to [email or web address]

## 2023-07-28 NOTE — TOC Progression Note (Signed)
Transition of Care (TOC) - Progression Note    Patient Details  Name: [name]. MRN: [id number] Date of Birth: 1950/12/08  Transition of Care [facility]) CM/SW Contact  Mearl Latin, LCSW Phone Number: 07/28/2023, 2:34 PM  Clinical Narrative:    2:34 PM-CSW received request from RN to speak with spouse about discharge plan and facility options. Palliative in with spouse now so CSW will follow up with decision on whether or not to start comfort care. If patient is not able to participate in therapies, insurance will not cover SNF placement.    Expected Discharge Plan: Skilled Nursing Facility Barriers to Discharge: Continued Medical Work up  Expected Discharge Plan and Services In-house Referral: Clinical Social Work   Post Acute Care Choice: Skilled Nursing Facility Living arrangements for the past 2 months: Skilled Nursing Facility                                       Social Determinants of Health (SDOH) Interventions SDOH Screenings   Food Insecurity: No Food Insecurity (07/19/2023)  Housing: Low Risk  (07/19/2023)  Transportation Needs: No Transportation Needs (07/19/2023)  Utilities: Not At Risk (07/19/2023)  Alcohol Screen: Low Risk  (06/28/2020)  Depression (PHQ2-9): Low Risk  (05/01/2020)  Financial Resource Strain: Low Risk  (06/28/2020)  Physical Activity: Inactive (06/28/2020)  Social Connections: Moderately Integrated (06/28/2020)  Stress: No Stress Concern Present (06/28/2020)  Tobacco Use: Medium Risk (07/20/2023)    Readmission Risk Interventions    07/20/2023    7:47 AM  Readmission Risk Prevention Plan  Transportation Screening Complete  HRI or Home Care Consult Complete  Social Work Consult for Recovery Care Planning/Counseling Complete  Palliative Care Screening Not Applicable  Medication Review Oceanographer) Complete

## 2023-07-28 NOTE — Progress Notes (Signed)
Occupational Therapy Treatment Patient Details Name: [name]. MRN: [id number] DOB: [birth year] Today's Date: 07/28/2023   History of present illness 72 y.o. male presents to outside hospital on 07/19/2023 with anemia and black stools. S/p EGD on 11/26 and experienced a Vfib arrest during procedure lasting 1 min with 2 rounds of CPR. Pt admitted to ICU for management of new HFrEF, RV systolic dysfunction, distributive/hemorrhagic shock, metabolic encephalopathy, and resp failure requiring intubation 11/26-11/27. Pt was recently hospitalized from 11/7-11/21 with gastric adenocarcinoma, undergoing gastrojejunostomy on 11/13. Additional PMH includes: aflutter, autoimmune hemolytic anemia, CKD III, diastolic HF, HTN, DM.   OT comments  This 72 yo male admitted with above presents to acute OT today fully awake/alert and following all tasks asked of him. His RUE is less edematous than it was when last seen by myself and LUE seems more edematous. Pt will continue to benefit from acute OT with follow up from continued inpatient follow up therapy, <3 hours/day.       If plan is discharge home, recommend the following:  Two people to help with walking and/or transfers;Two people to help with bathing/dressing/bathroom;Assistance with cooking/housework;Assist for transportation;Help with stairs or ramp for entrance;Assistance with feeding;Direct supervision/assist for financial management;Direct supervision/assist for medications management   Equipment Recommendations  Other (comment) (TBD next venue)       Precautions / Restrictions Precautions Precautions: Fall Precaution Comments: watch HR Restrictions Weight Bearing Restrictions: No       Mobility Bed Mobility               General bed mobility comments: pt able to pull himself forward getting his back off the bed a couple of inches x2 by reaching and pulling on Bil lower rails while bed was in chair position           ADL  either performed or assessed with clinical judgement   ADL Overall ADL's : Needs assistance/impaired     Grooming: Wash/dry face;Set up;Bed level                                      Extremity/Trunk Assessment Upper Extremity Assessment RUE Deficits / Details: Edema better than last time he was seen (5 days ago), moving arm well, can place and hold for shoulder flexion RUE Coordination: decreased fine motor;decreased gross motor LUE Deficits / Details: whole arm edeamtous, pt able to do or initiate every movement asked of him LUE Coordination: decreased gross motor;decreased fine motor            Vision Baseline Vision/History: 1 Wears glasses (reading and driving but mainly for reading per wife) Patient Visual Report: No change from baseline            Cognition Arousal: Alert Behavior During Therapy: Flat affect                                   General Comments: Pt followed 75% of one step commands the first time        Exercises Other Exercises Other Exercises: Pt completed 10 reps (A/AAROM) of each supine in bed with Bil UEs: composite finger flexion/extension, forearm supination (limited)/pronation, wrist flexion/extension, elbow flexion/extension, and partial shoulder elevation (to ~90 degees, needed more A for this one compared others). Also LEs straight leg raises (the hardest for him), hip adduction/abduction, knee flexion/extension (  all LEs were AAROM)            Pertinent Vitals/ Pain       Pain Assessment Pain Assessment: Faces Faces Pain Scale: No hurt         Frequency  Min 1X/week        Progress Toward Goals  OT Goals(current goals can now be found in the care plan section)  Progress towards OT goals:  (more awake and alert today, following and trying all exercises asked of him)  Acute Rehab OT Goals Patient Stated Goal: Pt agreeable to work with therapy OT Goal Formulation: With patient/family Time For  Goal Achievement: 08/06/23 Potential to Achieve Goals: Fair         AM-PAC OT "6 Clicks" Daily Activity     Outcome Measure   Help from another person eating meals?: Total Help from another person taking care of personal grooming?: A Lot Help from another person toileting, which includes using toliet, bedpan, or urinal?: Total Help from another person bathing (including washing, rinsing, drying)?: Total Help from another person to put on and taking off regular upper body clothing?: Total Help from another person to put on and taking off regular lower body clothing?: Total 6 Click Score: 7    End of Session    OT Visit Diagnosis: Muscle weakness (generalized) (M62.81);Other symptoms and signs involving cognitive function   Activity Tolerance Patient tolerated treatment well   Patient Left in bed;with call bell/phone within reach;with bed alarm set;with family/visitor present           Time: 7829-5621 OT Time Calculation (min): 16 min  Charges: OT General Charges $OT Visit: 1 Visit OT Treatments $Therapeutic Exercise: 8-22 mins  Lindon Romp OT Acute Rehabilitation Services Office [phone]    Evette Georges 07/28/2023, 5:33 PM

## 2023-07-28 NOTE — Evaluation (Signed)
Clinical/Bedside Swallow Evaluation Patient Details  Name: [name]. MRN: [id number] Date of Birth: 03-Jun-1951  Today's Date: 07/28/2023 Time: SLP Start Time (ACUTE ONLY): 1355 SLP Stop Time (ACUTE ONLY): 1421 SLP Time Calculation (min) (ACUTE ONLY): 26 min  Past Medical History:  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   autologous stem cell transplant 12/2005   Obesity, morbid (more than 100 lbs over ideal weight or BMI > 40) (HCC)    Venous insufficiency 04/29/2011   Past Surgical History:  Past Surgical History:  Procedure Laterality Date   BACK SURGERY  2006   T6 vertebrae removed/titanium placed   BIOPSY  07/02/2023   Procedure: BIOPSY;  Surgeon: Franky Macho, MD;  [facility];  Service: Endoscopy;;   CATARACT EXTRACTION W/PHACO Right 03/31/2019   Procedure: CATARACT EXTRACTION PHACO AND INTRAOCULAR LENS PLACEMENT (IOC);  Surgeon: Fabio Pierce, MD;  Location: [facility];  Service: Ophthalmology;  Laterality: Right;  CDE: 3.21   CATARACT EXTRACTION W/PHACO Left 04/14/2019   Procedure: CATARACT EXTRACTION PHACO AND INTRAOCULAR LENS PLACEMENT (IOC);  Surgeon: Fabio Pierce, MD;  Location: [facility];  Service: Ophthalmology;  Laterality: Left;  left - pt knows to arrive at 7:45, CDE: 3.55   COLONOSCOPY  11/24/2004   Polyps in the left colon ablated/removed as described above.  Two submucosal lesions consistent with lipomas as described above not  manipulated./ Normal rectum   COLONOSCOPY  06/20/2012   MULTIPLE RECTAL AND COLONIC POLYPS   COLONOSCOPY N/A 03/08/2015   RMR: Capacious, redundant colon. Multiple colonic and rectosigmoid polyps removed. ablated as described  above. colonic lipoma abnormal appearing terminal ileum likely a variant of normal). Howeverwith history  biopsies obtained.    ESOPHAGOGASTRODUODENOSCOPY N/A 03/08/2015   RMR: Hiatal hernia Polypoid gastric mucosa with multiple gastric polyps. largest polyp removed via snare polypectomgy hemostasis clip placed at base. Status post gastric biopsy. Status post video capsule placement.    ESOPHAGOGASTRODUODENOSCOPY (EGD) WITH PROPOFOL N/A 07/02/2023   Procedure: ESOPHAGOGASTRODUODENOSCOPY (EGD) WITH PROPOFOL;  Surgeon: Franky Macho, MD;  [facility];  Service: Endoscopy;  Laterality: N/A;   ESOPHAGOGASTRODUODENOSCOPY (EGD) WITH PROPOFOL N/A 07/20/2023   Procedure: ESOPHAGOGASTRODUODENOSCOPY (EGD) WITH PROPOFOL;  Surgeon: Franky Macho, MD;  [facility];  Service: Endoscopy;  Laterality: N/A;   GASTROJEJUNOSTOMY N/A 07/07/2023   Procedure: GASTROJEJUNOSTOMY;  Surgeon: Lewie Chamber, DO;  Location: [facility];  Service: General;  Laterality: N/A;   GIVENS CAPSULE STUDY N/A 03/08/2015   Procedure: GIVENS CAPSULE STUDY;  Surgeon: Corbin Ade, MD;  [facility];  Service: Endoscopy;  Laterality: N/A;   GOLD SEED IMPLANT N/A 09/08/2022   Procedure: GOLD SEED IMPLANT;  Surgeon: Malen Gauze, MD;  Location: [facility];  Service: Urology;  Laterality: N/A;   HEMOSTASIS CONTROL  07/20/2023   Procedure: HEMOSTASIS CONTROL;  Surgeon: Franky Macho, MD;  [facility];  Service: Endoscopy;;   LIMBAL STEM CELL TRANSPLANT     LUNG BIOPSY  6/06   MULTIPLE TOOTH EXTRACTIONS  04/2005   port a cath placement     PORT-A-CATH  REMOVAL  09/08/2012   Procedure: REMOVAL PORT-A-CATH;  Surgeon: Loreli Slot, MD;  Location: [facility];  Service: Thoracic;  Laterality: N/A;   SPACE OAR INSTILLATION N/A 09/08/2022   Procedure: SPACE OAR INSTILLATION;  Surgeon: Malen Gauze, MD;  Location: [facility];  Service: Urology;  Laterality: N/A;   HPI:  72 year  old man initially seen by speech 11/28 and found to have  multifactoral dysphagia involving deconditioning, cognition, and likely severe esophageal dysphagia and GI issues at baseline. Clear liquid diet recommended and ST signed off. Pt had been upgraded to soft diet and ST reordered for appropriate diet. Per chart pt remains critically ill following brief cardiac arrest during upper endoscopy for upper GI bleed from gastric malignancy.  Arrest likely due to combination of hypovolemia and anesthesia. Intubated 11/26-11/27. Pt vomited after extubation. RN concerned for regurgitation of bowel. Pt belching during swallow screen attempts.  CXR prior to extubation: Increasing vascular congestion and perihilar airspace disease,  with trace bilateral pleural effusions, consistent with worsening  volume status and pulmonary edema  EGD 11/26 which showed a large fungating, ulcerated mass in the gastric antrum. Hemostasis was obtained with hemostatic spray. Patient had a brief cardiac arrest, requiring CPR, intubation, and transfer to [facility]. UGI on 11/18 - There is a moderate-to-large amount of reflux noted to the level of  the mid/distal esophagus. History of gastric outlet obstruction, post surgical  creation of a gastrojejunostomy.    Assessment / Plan / Recommendation  Clinical Impression  Pt known to ST services and apparently has not been eating/drinking much. He has known reflux seen on UGI and is deconditioned. Diet was clear liquids and upgraded to soft by MD. Pt exhibited intermittent signs of pharyngeal dysphagia with coughing with applesauce only with first 2 bites and 1-2 sips water. Signs of aspiration decreased as evaluation progressed. He demonstrated mild-mod prolonged oral formation and transit with puree and water via straw. Eructation present as he has history of reflux. Dentition is poor (one lower tooth and no upper) and deconditioning prevented attempts at solids. Recommend Dys 1 (puree), thin  liquids, crush meds and stay upright 45 min after meals. Discussed possibility of aspiration with wife and Palliative care NP (arrived at end of session) and liklihood of continued decreased po intake. Wife voiced she was pleased with how pt performed with SLP. ST will continue to see short term for continued education with pt and wife, appropriateness of diet which may not be able to be advanced given deconditioned state. SLP Visit Diagnosis: Dysphagia, unspecified (R13.10)    Aspiration Risk  Risk for inadequate nutrition/hydration;Mild aspiration risk;Moderate aspiration risk    Diet Recommendation Dysphagia 1 (Puree);Thin liquid    Liquid Administration via: Straw Medication Administration: Crushed with puree Supervision: Full supervision/cueing for compensatory strategies Compensations: Slow rate;Small sips/bites Postural Changes: Remain upright for at least 30 minutes after po intake;Seated upright at 90 degrees    Other  Recommendations Oral Care Recommendations: Oral care BID    Recommendations for follow up therapy are one component of a multi-disciplinary discharge planning process, led by the attending physician.  Recommendations may be updated based on patient status, additional functional criteria and insurance authorization.  Follow up Recommendations No SLP follow up      Assistance Recommended at Discharge Frequent or constant Supervision/Assistance  Functional Status Assessment Patient has had a recent decline in their functional status and demonstrates the ability to make significant improvements in function in a reasonable and predictable amount  of time.  Frequency and Duration min 2x/week  2 weeks       Prognosis Prognosis for improved oropharyngeal function: Fair      Swallow Study   General Date of Onset: 07/28/23 HPI: 72 year old man initially seen by speech 11/28 and found to have  multifactoral dysphagia involving deconditioning, cognition, and likely  severe esophageal dysphagia and GI issues at baseline. Clear liquid diet recommended and ST signed off. Pt had been upgraded to soft diet and ST reordered for appropriate diet. Per chart pt remains critically ill following brief cardiac arrest during upper endoscopy for upper GI bleed from gastric malignancy.  Arrest likely due to combination of hypovolemia and anesthesia. Intubated 11/26-11/27. Pt vomited after extubation. RN concerned for regurgitation of bowel. Pt belching during swallow screen attempts.  CXR prior to extubation: Increasing vascular congestion and perihilar airspace disease,  with trace bilateral pleural effusions, consistent with worsening  volume status and pulmonary edema  EGD 11/26 which showed a large fungating, ulcerated mass in the gastric antrum. Hemostasis was obtained with hemostatic spray. Patient had a brief cardiac arrest, requiring CPR, intubation, and transfer to [facility]. UGI on 11/18 - There is a moderate-to-large amount of reflux noted to the level of  the mid/distal esophagus. History of gastric outlet obstruction, post surgical  creation of a gastrojejunostomy. Type of Study: Bedside Swallow Evaluation Previous Swallow Assessment:  (see HPI) Diet Prior to this Study: Thin liquids (Level 0);Other (Comment) (soft) Temperature Spikes Noted: No Respiratory Status: Nasal cannula History of Recent Intubation: Yes Total duration of intubation (days): 2 days Date extubated: 07/21/23 Behavior/Cognition: Alert;Cooperative;Pleasant mood;Requires cueing Oral Cavity Assessment: Dry Oral Care Completed by SLP: No Oral Cavity - Dentition: Missing dentition;Poor condition (has one lower tooth poor condition and no upper dentition) Self-Feeding Abilities: Needs assist Patient Positioning: Upright in bed Baseline Vocal Quality: Normal Volitional Cough:  (mildly weak) Volitional Swallow: Able to elicit    Oral/Motor/Sensory Function Overall Oral Motor/Sensory Function: Generalized  oral weakness   Ice Chips Ice chips: Not tested   Thin Liquid Thin Liquid: Impaired Presentation: Straw Oral Phase Functional Implications: Prolonged oral transit Pharyngeal  Phase Impairments: Cough - Immediate    Nectar Thick Nectar Thick Liquid: Not tested   Honey Thick Honey Thick Liquid: Not tested   Puree Puree: Impaired Presentation: Spoon Oral Phase Functional Implications: Prolonged oral transit Pharyngeal Phase Impairments: Cough - Immediate   Solid     Solid: Not tested      Royce Macadamia 07/28/2023,3:03 PM

## 2023-07-28 NOTE — Progress Notes (Signed)
Daily Progress Note   Patient Name: [name].       Date: 07/28/2023 DOB: [birth year]  Age: 72 y.o. MRN#: [id number] Attending Physician: Leroy Sea, MD Primary Care Physician: [name], MD Admit Date: 07/19/2023  Reason for Consultation/Follow-up: Establishing goals of care  Subjective: I have reviewed medical records including EPIC notes, MAR, and labs. Received report from primary RN - no acute concerns. RN reports also discussing patient's poor prognosis with wife.  Went to visit patient at bedside - wife/[name] present. SLP had just finished evaluation and was providing wife education on recommended diet and concerns regarding possible aspiration and not eat/drinking enough to sustain himself long term. Patient was lying in bed awake, alert, oriented to self and part situation - he is lethargic and does not participate in GOC or discussions at bedside despite attempts at engaging him. No signs or non-verbal gestures of pain or discomfort noted. No respiratory distress, increased work of breathing, or secretions noted.   Emotional support provided to wife. [name] feels patient has made great improvements today and praises God for his blessings. Validated I was happy she could be present for his wakeful, meaningful moments. Reviewed patient's anticipated decline/poor prognosis in context of cancer and malnutrition. Attempted to discuss goals around continuing current gentle medical interventions but not escalating care in the event of anticipated decline - discussed in detail. Expressed that aggressive care would likely prolong suffering without realistic chance of recovery. Natural trajectory at EOL reviewed. [name] does not agree to this today; however, tells me she  wishes her family could be present to hear information. Offered and encouraged family meeting - she feels this would be very helpful. She would like her daughter, SIL, and niece present. [name] will call PMT with time family can meet - hopefully tomorrow 12/5 or Friday 12/6.  Chaplain support offered - family's pastor has been by and they decline.  All questions and concerns addressed. Encouraged to call with questions and/or concerns. PMT card provided.  Length of Stay: 9  Current Medications: Scheduled Meds:   carvedilol  6.25 mg Oral BID WC   Chlorhexidine Gluconate Cloth  6 each Topical Daily   docusate  100 mg Oral BID   feeding supplement  1 Container Oral TID BM   insulin aspart  0-6 Units Subcutaneous  TID WC   pantoprazole (PROTONIX) IV  40 mg Intravenous Q12H    Continuous Infusions:  cefTRIAXone (ROCEPHIN)  IV 2 g (07/27/23 1710)   dextrose 75 mL/hr at 07/28/23 1128   metronidazole 500 mg (07/28/23 0614)    PRN Meds: acetaminophen **OR** acetaminophen, fentaNYL (SUBLIMAZE) injection, HYDROmorphone (DILAUDID) injection, ipratropium-albuterol, metoprolol tartrate, ondansetron **OR** ondansetron (ZOFRAN) IV, mouth rinse  Physical Exam Vitals and nursing note reviewed.  Constitutional:      General: He is not in acute distress.    Appearance: He is ill-appearing.  Pulmonary:     Effort: No respiratory distress.  Skin:    General: Skin is warm and dry.  Neurological:     Mental Status: He is lethargic.     Motor: Weakness present.             Vital Signs: BP 109/65 (BP Location: Right Arm)   Pulse 89   Temp (!) 97.5 F (36.4 C) (Oral)   Resp (!) 24   Ht 5\' 11"  (1.803 m)   Wt 91.5 kg   SpO2 98%   BMI 28.13 kg/m  SpO2: SpO2: 98 % O2 Device: O2 Device: Nasal Cannula O2 Flow Rate: O2 Flow Rate (L/min): 2 L/min  Intake/output summary: No intake or output data in the 24 hours ending 07/28/23 1412 LBM: Last BM Date : 07/24/23 Baseline Weight: Weight: 90.5  kg Most recent weight: Weight: 91.5 kg  Palliative Assessment/Data: PPS 20%      Patient Active Problem List   Diagnosis Date Noted   PAF (paroxysmal atrial fibrillation) (HCC) 07/27/2023   Cardiac arrest (HCC) 07/26/2023   Failure to thrive in adult 07/25/2023   ABLA (acute blood loss anemia) 07/20/2023   Acute post-hemorrhagic anemia 07/20/2023   GI bleed 07/19/2023   Superficial vein thrombosis 07/19/2023   Acute renal failure superimposed on stage 3a chronic kidney disease (HCC) 07/08/2023   Primary gastric signet ring cell carcinoma (HCC) 07/05/2023   Gastric mass 07/02/2023   Malnutrition of moderate degree 07/02/2023   Gastric outlet obstruction 07/01/2023   Aortic atherosclerosis (HCC) 08/27/2022   Malignant neoplasm of prostate (HCC) 06/22/2022   Elevated PSA 05/14/2022   Vitamin D deficiency 01/25/2020   Uncontrolled type 2 diabetes mellitus with hyperglycemia (HCC) 12/14/2019   Cellulitis of lower extremity 12/14/2019   Autoimmune hemolytic anemia due to IgG (HCC)    Symptomatic anemia    Anemia 08/02/2019   Macrocytic anemia 08/02/2019   Atrial flutter (HCC) 09/07/2017   Bilateral pneumonia 09/07/2017   Acute respiratory failure with hypoxia (HCC) 05/19/2017   AKI (acute kidney injury) (HCC)    Sepsis due to pneumonia (HCC) 05/18/2017   Type 2 diabetes mellitus with complication, without long-term current use of insulin (HCC) 02/19/2017   Essential hypertension, benign 04/03/2015   IDA (iron deficiency anemia)    Mucosal abnormality of stomach    Hx of adenomatous colonic polyps 06/13/2012   Status post autologous bone marrow transplantation (HCC) 07/15/2011   Venous insufficiency 04/29/2011   DLBCL (diffuse large B cell lymphoma) (HCC) 02/28/2009   Mixed hyperlipidemia 02/28/2009   Class 2 severe obesity due to excess calories with serious comorbidity and body mass index (BMI) of 36.0 to 36.9 in adult [facility]) 02/28/2009   DIASTOLIC HEART FAILURE, CHRONIC  02/28/2009   EDEMA 02/28/2009    Palliative Care Assessment & Plan   Patient Profile: 72 y.o. male with past medical history of a flutter, autoimmune hemolytic anemia, CKD 3, diastolic heart failure, HTN/HLD, newly  diagnosed gastric mass, diabetes, recent hospital stay with discharged to [facility] for short-term rehab admitted on 07/19/2023 with GI bleed. Hospitalization 11/7 - 11/21 for gastric outlet obstruction due to gastric adenocarcinoma with palliative gastrojejunostomy 11/13. EGD revealed large circumferential fungating malignant gastric tumor.   Assessment: Principal Problem:   GI bleed Active Problems:   DIASTOLIC HEART FAILURE, CHRONIC   Essential hypertension, benign   Type 2 diabetes mellitus with complication, without long-term current use of insulin (HCC)   Atrial flutter (HCC)   Gastric outlet obstruction   Malnutrition of moderate degree   Primary gastric signet ring cell carcinoma (HCC)   Superficial vein thrombosis   ABLA (acute blood loss anemia)   Acute post-hemorrhagic anemia   Failure to thrive in adult   Cardiac arrest (HCC)   PAF (paroxysmal atrial fibrillation) (HCC)   Concern about end of life  Recommendations/Plan: Continue current gentle medical treatment. Wife would want escalation of care in the event of decline at this time despite recommendation against  Continue DNR/DNI as previously documented - durable DNR form completed and placed in shadow chart. Copy was made and will be scanned into Vynca/ACP tab Wife is open to family meeting - date/time TBD. Hopefully tomorrow 12/5 or Friday 12/6 Recommend comfort care/hospice for this patient with stage IV gastric carcinoma, not a candidate for chemotherapy, and is approaching end of life if/when family agreeable  PMT will continue to follow and support holistically  Goals of Care and Additional Recommendations: Limitations on Scope of Treatment: No Tracheostomy  Code Status:    Code Status  Orders  (From admission, onward)           Start     Ordered   07/27/23 1349  Do not attempt resuscitation (DNR)- Limited -Do Not Intubate (DNI)  (Code Status)  Continuous       Question Answer Comment  If pulseless and not breathing No CPR or chest compressions.   In Pre-Arrest Conditions (Patient Is Breathing and Has A Pulse) Do not intubate. Provide all appropriate non-invasive medical interventions. Avoid ICU transfer unless indicated or required.   Consent: Discussion documented in EHR or advanced directives reviewed      07/27/23 1348           Code Status History     Date Active Date Inactive Code Status Order ID Comments User Context   07/19/2023 1612 07/27/2023 1348 Full Code [id number]  Onnie Boer, MD ED   07/01/2023 2133 07/16/2023 0239 Full Code [id number]  Elgergawy, Leana Roe, MD ED   08/02/2019 1605 08/07/2019 1605 Full Code [id number]  Darlin Priestly, MD Inpatient   09/06/2017 2342 09/10/2017 1735 Full Code [id number]  Meredeth Ide, MD Inpatient   05/18/2017 2138 05/22/2017 0058 Full Code [id number]  Jonah Blue, MD Inpatient       Prognosis:  Days-weeks  Discharge Planning: To Be Determined  Care plan was discussed with primary RN, patient's wife, Dr. Thedore Mins, [facility]  Thank you for allowing the Palliative Medicine Team to assist in the care of this patient.   Total Time 51 minutes Prolonged Time Billed  no       Haskel Khan, NP  Please contact Palliative Medicine Team phone at [phone] for questions and concerns.   *Portions of this note are a verbal dictation therefore any spelling and/or grammatical errors are due to the "[employer] One" system interpretation.

## 2023-07-29 DIAGNOSIS — I5021 Acute systolic (congestive) heart failure: Secondary | ICD-10-CM | POA: Diagnosis not present

## 2023-07-29 LAB — CBC WITH DIFFERENTIAL/PLATELET
Abs Immature Granulocytes: 0.02 10*3/uL (ref 0.00–0.07)
Basophils Absolute: 0 10*3/uL (ref 0.0–0.1)
Basophils Relative: 0 %
Eosinophils Absolute: 0.1 10*3/uL (ref 0.0–0.5)
Eosinophils Relative: 3 %
HCT: 25.6 % — ABNORMAL LOW (ref 39.0–52.0)
Hemoglobin: 7.7 g/dL — ABNORMAL LOW (ref 13.0–17.0)
Immature Granulocytes: 0 %
Lymphocytes Relative: 7 %
Lymphs Abs: 0.4 10*3/uL — ABNORMAL LOW (ref 0.7–4.0)
MCH: 28.3 pg (ref 26.0–34.0)
MCHC: 30.1 g/dL (ref 30.0–36.0)
MCV: 94.1 fL (ref 80.0–100.0)
Monocytes Absolute: 0.4 10*3/uL (ref 0.1–1.0)
Monocytes Relative: 8 %
Neutro Abs: 4.1 10*3/uL (ref 1.7–7.7)
Neutrophils Relative %: 82 %
Platelets: 87 10*3/uL — ABNORMAL LOW (ref 150–400)
RBC: 2.72 MIL/uL — ABNORMAL LOW (ref 4.22–5.81)
RDW: 17.1 % — ABNORMAL HIGH (ref 11.5–15.5)
WBC: 5 10*3/uL (ref 4.0–10.5)
nRBC: 0 % (ref 0.0–0.2)

## 2023-07-29 LAB — BASIC METABOLIC PANEL
Anion gap: 9 (ref 5–15)
BUN: 25 mg/dL — ABNORMAL HIGH (ref 8–23)
CO2: 28 mmol/L (ref 22–32)
Calcium: 7.9 mg/dL — ABNORMAL LOW (ref 8.9–10.3)
Chloride: 108 mmol/L (ref 98–111)
Creatinine, Ser: 1.55 mg/dL — ABNORMAL HIGH (ref 0.61–1.24)
GFR, Estimated: 48 mL/min — ABNORMAL LOW (ref >60)
Glucose, Bld: 112 mg/dL — ABNORMAL HIGH (ref 70–99)
Potassium: 3.4 mmol/L — ABNORMAL LOW (ref 3.5–5.1)
Sodium: 145 mmol/L (ref 135–145)

## 2023-07-29 LAB — GLUCOSE, CAPILLARY
Glucose-Capillary: 87 mg/dL (ref 70–99)
Glucose-Capillary: 89 mg/dL (ref 70–99)
Glucose-Capillary: 90 mg/dL (ref 70–99)
Glucose-Capillary: 92 mg/dL (ref 70–99)
Glucose-Capillary: 94 mg/dL (ref 70–99)
Glucose-Capillary: 95 mg/dL (ref 70–99)

## 2023-07-29 LAB — BRAIN NATRIURETIC PEPTIDE: B Natriuretic Peptide: 151.3 pg/mL — ABNORMAL HIGH (ref 0.0–100.0)

## 2023-07-29 LAB — MAGNESIUM: Magnesium: 2 mg/dL (ref 1.7–2.4)

## 2023-07-29 LAB — PHOSPHORUS: Phosphorus: 2.6 mg/dL (ref 2.5–4.6)

## 2023-07-29 MED ORDER — POLYETHYLENE GLYCOL 3350 17 G PO PACK: 17.0000 g | PACK | Freq: Two times a day (BID) | ORAL | Status: DC

## 2023-07-29 MED ORDER — BISACODYL 10 MG RE SUPP
10.0000 mg | Freq: Every day | RECTAL | Status: DC | PRN
  Administered 2023-07-29: 10 mg via RECTAL
  Filled 2023-07-29: qty 1

## 2023-07-29 NOTE — Progress Notes (Signed)
Couldnot place new IV on the patient as patient's B/L arms are swollen.IV team suggested to have PICC line.Patient is due with his antibiotic and IVF.MD Susa Raring made aware.

## 2023-07-29 NOTE — Progress Notes (Signed)
Brief Palliative Medicine Progress Note:  Wife/[name] called PMT phone with request for family meeting tomorrow 12/6 at 3p. PMT will meet with patient's family for ongoing GOC.  Thank you for allowing PMT to assist in the care of this patient.  Rosalene Wardrop M. Katrinka Blazing [facility] Palliative Medicine Team Team Phone: [phone] NO CHARGE

## 2023-07-29 NOTE — Progress Notes (Signed)
PROGRESS NOTE        PATIENT DETAILS Name: [name]. Age: 72 y.o. Sex: male Date of Birth: [birth year] Admit Date: 07/19/2023 Admitting Physician Frank Wendall Stade, MD [id number], Frank Bishop, MD  Brief Summary: Patient is a 72 y.o.  male who was recently diagnosed with gastric outlet obstruction  secondary to gastric adenocarcinoma-s/p palliative gastrojejunostomy on 11/13-presented to the hospital with upper GI bleeding in the setting of Eliquis for A-fib.  Initially admitted at [facility] he was stabilized-underwent EGD-which was complicated by V-fib arrest.  Patient was intubated and subsequently transferred to ICU here at [facility].  Echocardiogram showed new systolic dysfunction-patient was stabilized-extubated and subsequently transferred to [facility] on 11/29.  See below for further details.  Significant events: 11/25>> admit to [facility]-upper GI bleed-acute blood loss anemia 11/26>> EGD-active bleeding-successful hemostasis-but had V-fib arrest-intubated-transferred to ICU [facility]. 11/27>> extubated 11/29>> transferred to [facility].  Significant studies: 11/27>> EF 30-35%, RV systolic function moderately reduced.  RVSP 53.9.  Significant microbiology data: None  Procedures: 11/26>> ONG:EXBMW, fungating and ulcerated, circumferential mass with oozing bleeding and stigmata of recent bleeding was found in the gastric antrum.  Hemostatic spray applied. 11/26-11/27>> ETT  Consults: GI Palliative care PCCM Cardiology Oncology  Subjective:  Patient in bed, appears comfortable but somnolent, very frail, denies any headache chest or abdominal pain.   Objective: Vitals: Blood pressure 120/63, pulse (!) 106, temperature 98.2 F (36.8 C), temperature source Oral, resp. rate (!) 24, height 5\' 11"  (1.803 m), weight 91.5 kg, SpO2 99%.   Exam:  Awake, no focal deficits, extremely frail and weak, weak cough reflex [place].AT,PERRAL Supple Neck, No JVD,   Symmetrical Chest wall  movement, Good air movement bilaterally, CTAB RRR,No Gallops, Rubs or new Murmurs,  +ve B.Sounds, Abd Soft, No tenderness,   No Cyanosis, Clubbing or edema   Assessment/Plan:  Upper GI bleeding with acute blood loss anemia Secondary to bleeding gastric malignancy-s/p EGD with hemostatic spray 11/26 Required 3 units of PRBC so far-last transfused 11/27 Type screen repeated again on 07/25/2023 after IV fluids expected to drop, will likely require more transfusions in the next few days.  Continue PPI IV.  Recent diagnosis of gastric outlet obstruction secondary to gastric adenocarcinoma-s/p palliative gastrojejunostomy on 11/13-Dr. Pappayliou at [facility] Follow with Dr. Frances [name] at [facility], palliative care also on board but family and patient wish all aggressive treatment, in my opinion he is a candidate for medical treatment directed towards comfort, he should be DNR but family and patient resistant.    Case discussed with critical care Dr. Karie [name], oncologist Dr. Arlana [name] & palliative care team in detail on 07/25/2023, all providers agree that patient has untreatable underlying condition with extremely poor prognosis, very poor functional status.  They all suggest pursuing comfort measures and hospice after much consultation wife is agreed for DNR DNI on 07/27/2023, continue medical treatment otherwise.  Have a long discussion with patient's daughter on 07/27/2023 over the hospital room phone, she has a good understanding that her dad is not doing well and she will talk to her mother about future course of action, really told her that her dad/patient is currently dying and the best course of action is hospice.  Continue counseling.  Family meeting with palliative care on 07/30/2023 likely will transition to comfort measures then.  Hypokalemia.  Replaced.    Extremely poor oral  intake, dysphagia, frail status, unable to participate with PT OT, dehydration and hypernatremia.  Again  supportive care, D5W and monitor.  Prognosis very poor.  Patient therapy following.  Remains at risk for aspiration.  V-fib arrest Occurred during EGD 11/26 Echo with new onset HFrEF and RV systolic dysfunction, EF 35%. Neurology following poor candidate for invasive procedure or testing due to underlying recent diagnosis of stage IV gastric cancer, continue Coreg, cannot give aspirin or Plavix due to recent GI bleed from the gastric cancer.  Monitor with supportive care.  Combined distributive/hemorrhagic shock Required pressors briefly Resolved Transfuse as needed try to keep hemoglobin above 7.5, monitor CBC  Acute hypoxic respiratory failure in the setting of V-fib arrest Extubated 11/27 Currently on 2 L of oxygen Mobilize Pulmonary toileting  New diagnosis of HFrEF EF now 30 to 35%. On Coreg, not on ACE inhibitor, ARB or Entresto due to AKI, cardiology on board, limited options for treatment kindly see #1 above.  Try to give Coreg if he is able to swallow safely, keep electrolytes stable.  Chronic atrial fibrillation [place] vas 2 score of greater than 4. Telemetry monitoring Rate well-controlled off rate control agents Placed on oral Coreg and IV Lopressor for rate control, continue to monitor.  Superficial venous thrombus-left cephalic vein Supportive care Not a candidate for anticoagulation, poor candidate for it due to recent GI bleed from the recently diagnosed gastric cancer.  HTN BP soft but stable Watch closely.  AKI with hypernatremia, dehydration due to poor oral intake. He is dehydrated, D5W, oral diet, avoid nephrotoxins, continue Foley for now.  Acute metabolic encephalopathy Multifactorial etiology-from critical illness-ICU delirium/AKI Moving all 4 extremities-somewhat redirectable Maintain delirium precautions.  Dysphagia.  Due to encephalopathy and severe deconditioning.  Unable to take oral medications or diet, clear liquid diet if tolerated, speech  therapy, gentle D5, poor candidate for NG tube or PEG tube.    DM-2 (A1c 4.6 on 10/23) CBG stable on SSI  Recent Labs    07/29/23 0018 07/29/23 0335 07/29/23 0805  GLUCAP 95 89 94    Palliative care Poor overall prognosis, Full code for now, Discussed with daughter/spouse at bedside-they understand tenuous situation-poor overall prognosis. Wife and patient want to pursue aggressive measures.  Palliative care, oncology and PCCM will be involved to opine.  Nutrition Status: Nutrition Problem: Moderate Malnutrition Etiology: acute illness (recently diagnosed gastric cancer) Signs/Symptoms: mild muscle depletion, mild fat depletion Interventions: Ensure Enlive (each supplement provides 350kcal and 20 grams of protein), MVI  Pressure Ulcer: Agree with assessment and plan as outlined below Pressure Injury 07/20/23 Buttocks Left Stage 2 -  Partial thickness loss of dermis presenting as a shallow open injury with a red, pink wound bed without slough. (Active)  07/20/23 1845  Location: Buttocks  Location Orientation: Left  Staging: Stage 2 -  Partial thickness loss of dermis presenting as a shallow open injury with a red, pink wound bed without slough.  Wound Description (Comments):   Present on Admission: Yes  Dressing Type Foam - Lift dressing to assess site every shift 07/29/23 0845    BMI: Estimated body mass index is 28.13 kg/m as calculated from the following:   Height as of this encounter: 5\' 11"  (1.803 m).   Weight as of this encounter: 91.5 kg.   Code status:   Code Status: Limited: Do not attempt resuscitation (DNR) -DNR-LIMITED -Do Not Intubate/DNI    DVT Prophylaxis: SCDs Start: 07/19/23 1926   Family Communication:   Spouse  at  bedside on 07/25/2023, 07/26/2023, 07/29/2023, awaiting palliative care meeting tomorrow.  Called daughter [phone] on 07/26/2023 at 8:36 AM.  Message left  Daughter bedside on 07/26/2023 in detail, she has a good understanding that her  diet is not doing well and she will talk to her mother.   Disposition Plan: Status is: Inpatient Remains inpatient appropriate because: Severity of illness   Planned Discharge Destination:Skilled nursing facility   Diet: Diet Order             DIET - DYS 1 Room service appropriate? No; Fluid consistency: Thin  Diet effective now                     Antimicrobial agents: Anti-infectives (From admission, onward)    Start     Dose/Rate Route Frequency Ordered Stop   07/23/23 1845  cefTRIAXone (ROCEPHIN) 2 g in sodium chloride 0.9 % 100 mL IVPB        2 g 200 mL/hr over 30 Minutes Intravenous Every 24 hours 07/23/23 1756     07/23/23 1845  metroNIDAZOLE (FLAGYL) IVPB 500 mg        500 mg 100 mL/hr over 60 Minutes Intravenous Every 12 hours 07/23/23 1756          MEDICATIONS: Scheduled Meds:  carvedilol  6.25 mg Oral BID WC   Chlorhexidine Gluconate Cloth  6 each Topical Daily   docusate  100 mg Oral BID   feeding supplement  1 Container Oral TID BM   insulin aspart  0-6 Units Subcutaneous TID WC   pantoprazole (PROTONIX) IV  40 mg Intravenous Q12H   Continuous Infusions:  cefTRIAXone (ROCEPHIN)  IV Stopped (07/28/23 1648)   dextrose 75 mL/hr at 07/29/23 0226   metronidazole Stopped (07/28/23 1826)   PRN Meds:.acetaminophen **OR** acetaminophen, bisacodyl, fentaNYL (SUBLIMAZE) injection, HYDROmorphone (DILAUDID) injection, ipratropium-albuterol, metoprolol tartrate, ondansetron **OR** ondansetron (ZOFRAN) IV, mouth rinse   I have personally reviewed following labs and imaging studies  LABORATORY DATA: CBC: Recent Labs  Lab 07/25/23 0652 07/26/23 0441 07/27/23 0503 07/28/23 0438 07/29/23 0457  WBC 3.3* 3.8* 4.9 5.0 5.0  NEUTROABS 2.9 3.0 4.0 4.0 4.1  HGB 8.0* 8.1* 8.1* 7.8* 7.7*  HCT 26.6* 26.6* 27.6* 25.8* 25.6*  MCV 93.7 96.7 95.5 97.0 94.1  PLT 101* 86* 83* 86* 87*    Basic Metabolic Panel: Recent Labs  Lab 07/25/23 0652 07/26/23 0441  07/27/23 0503 07/28/23 0438 07/29/23 0457  NA 154* 141 154* 148* 145  K 3.5 3.1* 3.8 3.7 3.4*  CL 114* 106 119* 114* 108  CO2 27 28 29 29 28   GLUCOSE 86 540* 95 136* 112*  BUN 41* 36* 34* 29* 25*  CREATININE 2.36* 2.15* 2.02* 1.76* 1.55*  CALCIUM 8.5* 7.5* 8.2* 7.7* 7.9*  MG 2.4 2.2 2.3 2.1 2.0  PHOS 3.1 2.9 2.2* 3.5 2.6    GFR: Estimated Creatinine Clearance: 50.6 mL/min (A) (by C-G formula based on SCr of 1.55 mg/dL (H)).  Liver Function Tests: Recent Labs  Lab 07/24/23 0408  AST 13*  ALT 10  ALKPHOS 50  BILITOT 1.0  PROT 4.6*  ALBUMIN 1.9*   Urine analysis:    Component Value Date/Time   COLORURINE YELLOW 07/09/2023 1840   APPEARANCEUR HAZY (A) 07/09/2023 1840   APPEARANCEUR Clear 05/03/2023 1056   LABSPEC 1.020 07/09/2023 1840   PHURINE 5.0 07/09/2023 1840   GLUCOSEU NEGATIVE 07/09/2023 1840   HGBUR SMALL (A) 07/09/2023 1840   BILIRUBINUR NEGATIVE 07/09/2023 1840   BILIRUBINUR  Negative 05/03/2023 1056   KETONESUR 5 (A) 07/09/2023 1840   PROTEINUR 100 (A) 07/09/2023 1840   NITRITE NEGATIVE 07/09/2023 1840   LEUKOCYTESUR NEGATIVE 07/09/2023 1840    Sepsis Labs: Lactic Acid, Venous    Component Value Date/Time   LATICACIDVEN 1.0 09/06/2017 2047    MICROBIOLOGY: Recent Results (from the past 240 hour(s))  MRSA Next Gen by PCR, Nasal     Status: None   Collection Time: 07/20/23  6:07 PM   Specimen: Nasal Mucosa; Nasal Swab  Result Value Ref Range Status   MRSA by PCR Next Gen NOT DETECTED NOT DETECTED Final    Comment: (NOTE) The GeneXpert MRSA Assay (FDA approved for NASAL specimens only), is one component of a comprehensive MRSA colonization surveillance program. It is not intended to diagnose MRSA infection nor to guide or monitor treatment for MRSA infections. Test performance is not FDA approved in patients less than 24 years old. Performed at [facility], [address], [place], [place] [address]     RADIOLOGY STUDIES/RESULTS: No  results found.   LOS: 10 days   Signature  -    Susa Raring M.D on 07/29/2023 at 10:58 AM   -  To page go to [email or web address]

## 2023-07-29 NOTE — Progress Notes (Signed)
Assessed w/ ultrasound for PIV;no suitable vein found.Pt arms are swollen. RN aware and will notify MD.

## 2023-07-29 NOTE — Plan of Care (Signed)
Problem: Education: Goal: Knowledge of General Education information will improve Description: Including pain rating scale, medication(s)/side effects and non-pharmacologic comfort measures 07/29/2023 1832 by Ellison Carwin, RN Outcome: Progressing 07/29/2023 1831 by Ellison Carwin, RN Outcome: Progressing   Problem: Health Behavior/Discharge Planning: Goal: Ability to manage health-related needs will improve 07/29/2023 1832 by Ellison Carwin, RN Outcome: Progressing 07/29/2023 1831 by Ellison Carwin, RN Outcome: Progressing   Problem: Clinical Measurements: Goal: Ability to maintain clinical measurements within normal limits will improve 07/29/2023 1832 by Ellison Carwin, RN Outcome: Progressing 07/29/2023 1831 by Ellison Carwin, RN Outcome: Progressing Goal: Will remain free from infection 07/29/2023 1832 by Ellison Carwin, RN Outcome: Progressing 07/29/2023 1831 by Ellison Carwin, RN Outcome: Progressing Goal: Diagnostic test results will improve 07/29/2023 1832 by Ellison Carwin, RN Outcome: Progressing 07/29/2023 1831 by Ellison Carwin, RN Outcome: Progressing Goal: Respiratory complications will improve 07/29/2023 1832 by Ellison Carwin, RN Outcome: Progressing 07/29/2023 1831 by Ellison Carwin, RN Outcome: Progressing Goal: Cardiovascular complication will be avoided 07/29/2023 1832 by Ellison Carwin, RN Outcome: Progressing 07/29/2023 1831 by Ellison Carwin, RN Outcome: Progressing   Problem: Activity: Goal: Risk for activity intolerance will decrease 07/29/2023 1832 by Ellison Carwin, RN Outcome: Progressing 07/29/2023 1831 by Ellison Carwin, RN Outcome: Progressing   Problem: Nutrition: Goal: Adequate nutrition will be maintained 07/29/2023 1832 by Ellison Carwin, RN Outcome: Progressing 07/29/2023 1831 by Ellison Carwin, RN Outcome: Progressing   Problem: Coping: Goal: Level of anxiety will decrease 07/29/2023 1832 by Ellison Carwin, RN Outcome: Progressing 07/29/2023 1831 by Ellison Carwin, RN Outcome: Progressing   Problem: Elimination: Goal: Will not experience complications related to bowel motility 07/29/2023 1832 by Ellison Carwin, RN Outcome: Progressing 07/29/2023 1831 by Ellison Carwin, RN Outcome: Progressing Goal: Will not experience complications related to urinary retention 07/29/2023 1832 by Ellison Carwin, RN Outcome: Progressing 07/29/2023 1831 by Ellison Carwin, RN Outcome: Progressing   Problem: Pain Management: Goal: General experience of comfort will improve 07/29/2023 1832 by Ellison Carwin, RN Outcome: Progressing 07/29/2023 1831 by Ellison Carwin, RN Outcome: Progressing   Problem: Safety: Goal: Ability to remain free from injury will improve 07/29/2023 1832 by Ellison Carwin, RN Outcome: Progressing 07/29/2023 1831 by Ellison Carwin, RN Outcome: Progressing   Problem: Skin Integrity: Goal: Risk for impaired skin integrity will decrease 07/29/2023 1832 by Ellison Carwin, RN Outcome: Progressing 07/29/2023 1831 by Ellison Carwin, RN Outcome: Progressing   Problem: Education: Goal: Ability to describe self-care measures that may prevent or decrease complications (Diabetes Survival Skills Education) will improve 07/29/2023 1832 by Ellison Carwin, RN Outcome: Progressing 07/29/2023 1831 by Ellison Carwin, RN Outcome: Progressing Goal: Individualized Educational Video(s) 07/29/2023 1832 by Ellison Carwin, RN Outcome: Progressing 07/29/2023 1831 by Ellison Carwin, RN Outcome: Progressing   Problem: Coping: Goal: Ability to adjust to condition or change in health will improve 07/29/2023 1832 by Ellison Carwin, RN Outcome: Progressing 07/29/2023 1831 by Ellison Carwin, RN Outcome: Progressing   Problem: Fluid Volume: Goal: Ability to maintain a balanced intake and output will improve 07/29/2023 1832 by Ellison Carwin, RN Outcome:  Progressing 07/29/2023 1831 by Ellison Carwin, RN Outcome: Progressing   Problem: Health Behavior/Discharge Planning: Goal: Ability to identify and utilize available resources and services will improve 07/29/2023 1832 by Ellison Carwin, RN Outcome: Progressing 07/29/2023 1831 by Ellison Carwin, RN Outcome: Progressing Goal: Ability to manage health-related needs will improve 07/29/2023 1832 by Ellison Carwin, RN Outcome: Progressing 07/29/2023 1831 by Ellison Carwin, RN Outcome: Progressing   Problem: Metabolic: Goal: Ability to maintain appropriate glucose levels will improve  07/29/2023 1832 by Ellison Carwin, RN Outcome: Progressing 07/29/2023 1831 by Ellison Carwin, RN Outcome: Progressing   Problem: Nutritional: Goal: Maintenance of adequate nutrition will improve 07/29/2023 1832 by Ellison Carwin, RN Outcome: Progressing 07/29/2023 1831 by Ellison Carwin, RN Outcome: Progressing Goal: Progress toward achieving an optimal weight will improve 07/29/2023 1832 by Ellison Carwin, RN Outcome: Progressing 07/29/2023 1831 by Ellison Carwin, RN Outcome: Progressing   Problem: Skin Integrity: Goal: Risk for impaired skin integrity will decrease 07/29/2023 1832 by Ellison Carwin, RN Outcome: Progressing 07/29/2023 1831 by Ellison Carwin, RN Outcome: Progressing   Problem: Tissue Perfusion: Goal: Adequacy of tissue perfusion will improve 07/29/2023 1832 by Ellison Carwin, RN Outcome: Progressing 07/29/2023 1831 by Ellison Carwin, RN Outcome: Progressing   Problem: Activity: Goal: Ability to tolerate increased activity will improve 07/29/2023 1832 by Ellison Carwin, RN Outcome: Progressing 07/29/2023 1831 by Ellison Carwin, RN Outcome: Progressing   Problem: Respiratory: Goal: Ability to maintain a clear airway and adequate ventilation will improve 07/29/2023 1832 by Ellison Carwin, RN Outcome: Progressing 07/29/2023 1831 by Ellison Carwin,  RN Outcome: Progressing   Problem: Role Relationship: Goal: Method of communication will improve 07/29/2023 1832 by Ellison Carwin, RN Outcome: Progressing 07/29/2023 1831 by Ellison Carwin, RN Outcome: Progressing   Problem: Education: Goal: Ability to identify signs and symptoms of gastrointestinal bleeding will improve Outcome: Progressing   Problem: Bowel/Gastric: Goal: Will show no signs and symptoms of gastrointestinal bleeding Outcome: Progressing   Problem: Fluid Volume: Goal: Will show no signs and symptoms of excessive bleeding Outcome: Progressing   Problem: Clinical Measurements: Goal: Complications related to the disease process, condition or treatment will be avoided or minimized Outcome: Progressing

## 2023-07-29 NOTE — Progress Notes (Signed)
Constipation: Chest nurse reported that patient did not any bowel movement since 07/24/2023.  Patient's wife at the bedside is very worried.  Given patient has dysphagia poor oral tolerance of the medication - Ordered bisacodyl suppository as needed.

## 2023-07-30 DIAGNOSIS — I5021 Acute systolic (congestive) heart failure: Secondary | ICD-10-CM | POA: Diagnosis not present

## 2023-07-30 LAB — CBC WITH DIFFERENTIAL/PLATELET
Abs Immature Granulocytes: 0.03 10*3/uL (ref 0.00–0.07)
Basophils Absolute: 0 10*3/uL (ref 0.0–0.1)
Basophils Relative: 0 %
Eosinophils Absolute: 0.1 10*3/uL (ref 0.0–0.5)
Eosinophils Relative: 2 %
HCT: 26.6 % — ABNORMAL LOW (ref 39.0–52.0)
Hemoglobin: 8.1 g/dL — ABNORMAL LOW (ref 13.0–17.0)
Immature Granulocytes: 1 %
Lymphocytes Relative: 8 %
Lymphs Abs: 0.4 10*3/uL — ABNORMAL LOW (ref 0.7–4.0)
MCH: 28.5 pg (ref 26.0–34.0)
MCHC: 30.5 g/dL (ref 30.0–36.0)
MCV: 93.7 fL (ref 80.0–100.0)
Monocytes Absolute: 0.5 10*3/uL (ref 0.1–1.0)
Monocytes Relative: 9 %
Neutro Abs: 4.5 10*3/uL (ref 1.7–7.7)
Neutrophils Relative %: 80 %
Platelets: 108 10*3/uL — ABNORMAL LOW (ref 150–400)
RBC: 2.84 MIL/uL — ABNORMAL LOW (ref 4.22–5.81)
RDW: 17.1 % — ABNORMAL HIGH (ref 11.5–15.5)
WBC: 5.6 10*3/uL (ref 4.0–10.5)
nRBC: 0.4 % — ABNORMAL HIGH (ref 0.0–0.2)

## 2023-07-30 LAB — GLUCOSE, CAPILLARY
Glucose-Capillary: 105 mg/dL — ABNORMAL HIGH (ref 70–99)
Glucose-Capillary: 67 mg/dL — ABNORMAL LOW (ref 70–99)
Glucose-Capillary: 72 mg/dL (ref 70–99)
Glucose-Capillary: 74 mg/dL (ref 70–99)
Glucose-Capillary: 75 mg/dL (ref 70–99)
Glucose-Capillary: 76 mg/dL (ref 70–99)
Glucose-Capillary: 80 mg/dL (ref 70–99)

## 2023-07-30 LAB — BASIC METABOLIC PANEL
Anion gap: 6 (ref 5–15)
BUN: 27 mg/dL — ABNORMAL HIGH (ref 8–23)
CO2: 29 mmol/L (ref 22–32)
Calcium: 8.3 mg/dL — ABNORMAL LOW (ref 8.9–10.3)
Chloride: 113 mmol/L — ABNORMAL HIGH (ref 98–111)
Creatinine, Ser: 1.76 mg/dL — ABNORMAL HIGH (ref 0.61–1.24)
GFR, Estimated: 41 mL/min — ABNORMAL LOW (ref >60)
Glucose, Bld: 82 mg/dL (ref 70–99)
Potassium: 3.8 mmol/L (ref 3.5–5.1)
Sodium: 148 mmol/L — ABNORMAL HIGH (ref 135–145)

## 2023-07-30 MED ORDER — DEXTROSE 5 % IV SOLN: INTRAVENOUS | Status: AC

## 2023-07-30 NOTE — Progress Notes (Signed)
Physical Therapy Treatment Patient Details Name: [name]. MRN: [id number] DOB: 12-Sep-1950 Today's Date: 07/30/2023   History of Present Illness 72 y.o. male presents to outside hospital on 07/19/2023 with anemia and black stools. S/p EGD on 11/26 and experienced a Vfib arrest during procedure lasting 1 min with 2 rounds of CPR. Pt admitted to ICU for management of new HFrEF, RV systolic dysfunction, distributive/hemorrhagic shock, metabolic encephalopathy, and resp failure requiring intubation 11/26-11/27. Pt was recently hospitalized from 11/7-11/21 with gastric adenocarcinoma, undergoing gastrojejunostomy on 11/13. Additional PMH includes: aflutter, autoimmune hemolytic anemia, CKD III, diastolic HF, HTN, DM.    PT Comments  Pt seen for PT tx with pt agreeable, OT arriving for co-tx. Pt requires total assist for rolling L<>R, total assist +2 for supine>sit. Pt tolerates sitting EOB ~3 minutes with as little as supervision for brief static sitting. Pt assisted to recliner via +2 lateral scoot with pt pleased to be sitting in recliner. Pt demonstrates global weakness & decreased activity tolerance. Will continue to follow pt acutely to progress mobility as able.    If plan is discharge home, recommend the following: Two people to help with walking and/or transfers;A lot of help with bathing/dressing/bathroom;Assistance with cooking/housework;Direct supervision/assist for medications management;Assist for transportation;Help with stairs or ramp for entrance;Supervision due to cognitive status   Can travel by private vehicle     No  Equipment Recommendations  Other (comment) (defer to next venue)    Recommendations for Other Services       Precautions / Restrictions Precautions Precautions: Fall Precaution Comments: watch HR Restrictions Weight Bearing Restrictions: No     Mobility  Bed Mobility Overal bed mobility: Needs Assistance Bed Mobility: Rolling, Supine to Sit Rolling:  Total assist, +2 for physical assistance, Used rails   Supine to sit: +2 for physical assistance, Total assist, HOB elevated          Transfers Overall transfer level: Needs assistance Equipment used: None Transfers: Bed to chair/wheelchair/BSC   Stand pivot transfers: Total assist        Lateral/Scoot Transfers: Total assist, +2 physical assistance, From elevated surface (bed>drop arm recliner on R)      Ambulation/Gait                   Stairs             Wheelchair Mobility     Tilt Bed    Modified Rankin (Stroke Patients Only)       Balance Overall balance assessment: Needs assistance   Sitting balance-Leahy Scale: Fair Sitting balance - Comments: Pt requires assistance but can sustain static sitting briefly with supervision without LOB.                                    Cognition Arousal: Alert Behavior During Therapy: Flat affect Overall Cognitive Status: Difficult to assess                                 General Comments: Extra time to follow simple commands inconsistently, closing eyes 2/2 fatigue during session, minimal verbal conversation from pt.        Exercises      General Comments        Pertinent Vitals/Pain Pain Assessment Pain Assessment: Faces Faces Pain Scale: Hurts little more Pain Location: knee Pain Descriptors / Indicators:  Discomfort, Grimacing Pain Intervention(s): Monitored during session, Limited activity within patient's tolerance, Repositioned    Home Living                          Prior Function            PT Goals (current goals can now be found in the care plan section) Acute Rehab PT Goals Patient Stated Goal: per wife, to return home PT Goal Formulation: Patient unable to participate in goal setting Time For Goal Achievement: 08/06/23 Potential to Achieve Goals: Poor Progress towards PT goals: Progressing toward goals    Frequency    Min  1X/week      PT Plan      Co-evaluation              AM-PAC PT "6 Clicks" Mobility   Outcome Measure  Help needed turning from your back to your side while in a flat bed without using bedrails?: Total Help needed moving from lying on your back to sitting on the side of a flat bed without using bedrails?: Total Help needed moving to and from a bed to a chair (including a wheelchair)?: Total Help needed standing up from a chair using your arms (e.g., wheelchair or bedside chair)?: Total Help needed to walk in hospital room?: Total Help needed climbing 3-5 steps with a railing? : Total 6 Click Score: 6    End of Session Equipment Utilized During Treatment: Oxygen Activity Tolerance: Patient limited by lethargy;Patient limited by fatigue;Patient tolerated treatment well Patient left: in chair;with call bell/phone within reach (in care of OT) Nurse Communication: Mobility status PT Visit Diagnosis: Other abnormalities of gait and mobility (R26.89);Muscle weakness (generalized) (M62.81);Difficulty in walking, not elsewhere classified (R26.2)     Time: 0930-1004 PT Time Calculation (min) (ACUTE ONLY): 34 min  Charges:    $Therapeutic Activity: 8-22 mins PT General Charges $$ ACUTE PT VISIT: 1 Visit                     Aleda Grana, PT, DPT 07/30/23, 10:10 AM   Sandi Mariscal 07/30/2023, 10:09 AM

## 2023-07-30 NOTE — Progress Notes (Signed)
Occupational Therapy Treatment Patient Details Name: [name]. MRN: [id number] DOB: 22-Jan-1951 Today's Date: 07/30/2023   History of present illness 72 y.o. male presents to outside hospital on 07/19/2023 with anemia and black stools. S/p EGD on 11/26 and experienced a Vfib arrest during procedure lasting 1 min with 2 rounds of CPR. Pt admitted to ICU for management of new HFrEF, RV systolic dysfunction, distributive/hemorrhagic shock, metabolic encephalopathy, and resp failure requiring intubation 11/26-11/27. Pt was recently hospitalized from 11/7-11/21 with gastric adenocarcinoma, undergoing gastrojejunostomy on 11/13. Additional PMH includes: aflutter, autoimmune hemolytic anemia, CKD III, diastolic HF, HTN, DM.   OT comments  This 72 yo male seen today with PT to see how he would do with mobility and OOB. He tolerated OOB via lateral scoot from bed to drop arm recliner with use of bed pad (total A +2). He was awake and alert ~50% of session. He is moving his arms and legs as he was last OT session. He does not have the strength or coordination to self feed himself and currently is not swallowing fluid or pureed food when given to him (made RN, NT, and SLP aware). We will continue to follow.      If plan is discharge home, recommend the following:  Two people to help with walking and/or transfers;Two people to help with bathing/dressing/bathroom;Assistance with cooking/housework;Assist for transportation;Help with stairs or ramp for entrance;Assistance with feeding;Direct supervision/assist for financial management;Direct supervision/assist for medications management   Equipment Recommendations  Other (comment) (TBD next venue)       Precautions / Restrictions Precautions Precautions: Fall Precaution Comments: watch HR Restrictions Weight Bearing Restrictions: No       Mobility Bed Mobility Overal bed mobility: Needs Assistance Bed Mobility: Rolling, Supine to Sit Rolling:  Total assist, +2 for physical assistance, Used rails   Supine to sit: +2 for physical assistance, Total assist, HOB elevated     General bed mobility comments: Pt can move arms and legs but not enough to really A with rolling    Transfers Overall transfer level: Needs assistance Equipment used: None Transfers: Bed to chair/wheelchair/BSC            Lateral/Scoot Transfers: Total assist, +2 physical assistance, From elevated surface (V)       Balance Overall balance assessment: Needs assistance   Sitting balance-Leahy Scale: Fair Sitting balance - Comments: Pt requires assistance but can sustain static sitting briefly with supervision without LOB.                                   ADL either performed or assessed with clinical judgement   ADL Overall ADL's : Needs assistance/impaired Eating/Feeding: Total assistance;Sitting Eating/Feeding Details (indicate cue type and reason): in recliner; does not have enough coordination or strength in arms to feed self; also not swallowing food that is put in his mouth--can spit it out.                                        Extremity/Trunk Assessment Upper Extremity Assessment RUE Deficits / Details: Edema better than last time he was seen (6 days ago), moving arm well, can place and hold for shoulder flexion; grossly 2/5 LUE Deficits / Details: whole arm edeamtous, pt able to do or initiate every movement asked of him; grossly 2/5  Vision Baseline Vision/History: 1 Wears glasses (reading and driving but mainly for reading per wife) Patient Visual Report: No change from baseline            Cognition Arousal: Alert, Lethargic (varied between awake and sleepy) Behavior During Therapy: Flat affect Overall Cognitive Status: Difficult to assess                                 General Comments: Extra time to follow simple commands inconsistently, closing eyes 2/2 fatigue  during session, minimal verbal conversation from pt.                   Pertinent Vitals/ Pain       Pain Assessment Pain Assessment: Faces Faces Pain Scale: Hurts little more Pain Location: knee Pain Descriptors / Indicators: Discomfort, Grimacing Pain Intervention(s): Limited activity within patient's tolerance, Monitored during session, Repositioned         Frequency  Min 1X/week        Progress Toward Goals  OT Goals(current goals can now be found in the care plan section)  Progress towards OT goals:  (slowly)  Acute Rehab OT Goals Patient Stated Goal: agreeable to wanting to get OOB OT Goal Formulation: With patient Time For Goal Achievement: 08/06/23 Potential to Achieve Goals: Fair  Plan      Co-evaluation    PT/OT/SLP Co-Evaluation/Treatment: Yes Reason for Co-Treatment: For patient/therapist safety;To address functional/ADL transfers PT goals addressed during session: Mobility/safety with mobility;Balance;Strengthening/ROM OT goals addressed during session: Strengthening/ROM;ADL's and self-care      AM-PAC OT "6 Clicks" Daily Activity     Outcome Measure   Help from another person eating meals?: Total Help from another person taking care of personal grooming?: Total Help from another person toileting, which includes using toliet, bedpan, or urinal?: Total Help from another person bathing (including washing, rinsing, drying)?: Total Help from another person to put on and taking off regular upper body clothing?: Total Help from another person to put on and taking off regular lower body clothing?: Total 6 Click Score: 6    End of Session    OT Visit Diagnosis: Muscle weakness (generalized) (M62.81);Other symptoms and signs involving cognitive function   Activity Tolerance Patient tolerated treatment well   Patient Left in chair;with call bell/phone within reach;with chair alarm set   Nurse Communication  (NT and RN--hold food until speech can  see him, he could not swallow food or drink for me)        Time: 2956-2130 OT Time Calculation (min): 37 min  Charges: OT General Charges $OT Visit: 1 Visit OT Treatments $Therapeutic Activity: 8-22 mins  Lindon Romp OT Acute Rehabilitation Services Office [phone]    Evette Georges 07/30/2023, 11:14 AM

## 2023-07-30 NOTE — Progress Notes (Signed)
PROGRESS NOTE        PATIENT DETAILS Name: [name]. Age: 72 y.o. Sex: male Date of Birth: 30-Dec-1950 Admit Date: 07/19/2023 Admitting Physician Ejiroghene Wendall Stade, MD OZH:YQMVH, Lyman Bishop, MD  Brief Summary: Patient is a 72 y.o.  male who was recently diagnosed with gastric outlet obstruction  secondary to gastric adenocarcinoma-s/p palliative gastrojejunostomy on 11/13-presented to the hospital with upper GI bleeding in the setting of Eliquis for A-fib.  Initially admitted at [facility] he was stabilized-underwent EGD-which was complicated by V-fib arrest.  Patient was intubated and subsequently transferred to ICU here at [facility].  Echocardiogram showed new systolic dysfunction-patient was stabilized-extubated and subsequently transferred to [facility] on 11/29.  See below for further details.  Significant events: 11/25>> admit to [facility]-upper GI bleed-acute blood loss anemia 11/26>> EGD-active bleeding-successful hemostasis-but had V-fib arrest-intubated-transferred to ICU [facility]. 11/27>> extubated 11/29>> [facility] to [facility].  Significant studies: 11/27>> EF 30-35%, RV systolic function moderately reduced.  RVSP 53.9.  Significant microbiology data: None  Procedures: 11/26>> QIO:NGEXB, fungating and ulcerated, circumferential mass with oozing bleeding and stigmata of recent bleeding was found in the gastric antrum.  Hemostatic spray applied. 11/26-11/27>> ETT  Consults: GI Palliative care PCCM Cardiology Oncology  Subjective:  Patient in bed, appears comfortable but somnolent, very frail, denies any headache chest or abdominal pain.   Objective: Vitals: Blood pressure 116/74, pulse (!) 105, temperature 97.6 F (36.4 C), temperature source Oral, resp. rate 18, height 5\' 11"  (1.803 m), weight 91.5 kg, SpO2 98%.   Exam:  Awake, no focal deficits, extremely frail and weak, weak cough reflex [place].AT,PERRAL Supple Neck, No JVD,   Symmetrical Chest wall  movement, Good air movement bilaterally, CTAB RRR,No Gallops, Rubs or new Murmurs,  +ve B.Sounds, Abd Soft, No tenderness,   No Cyanosis, Clubbing or edema   Assessment/Plan:  Upper GI bleeding with acute blood loss anemia Secondary to bleeding gastric malignancy-s/p EGD with hemostatic spray 11/26 Required 3 units of PRBC so far-last transfused 11/27 Type screen repeated again on 07/25/2023 after IV fluids expected to drop, will likely require more transfusions in the next few days.  Continue PPI IV.  Recent diagnosis of gastric outlet obstruction secondary to gastric adenocarcinoma-s/p palliative gastrojejunostomy on 11/13-Dr. Pappayliou at [facility] Follow with Dr. Frances Maywood at [facility], palliative care also on board but family and patient wish all aggressive treatment, in my opinion he is a candidate for medical treatment directed towards comfort, he should be DNR but family and patient resistant.    Case discussed with critical care Dr. Karie Fetch, oncologist Dr. Arlana Pouch & palliative care team in detail on 07/25/2023, all providers agree that patient has untreatable underlying condition with extremely poor prognosis, very poor functional status.  They all suggest pursuing comfort measures and hospice after much consultation wife is agreed for DNR DNI on 07/27/2023, continue medical treatment otherwise.  Have a long discussion with patient's daughter on 07/27/2023 over the hospital room phone, she has a good understanding that her dad is not doing well and she will talk to her mother about future course of action, really told her that her dad/patient is currently dying and the best course of action is hospice.  Continue counseling.  Family meeting with palliative care on 07/30/2023 likely will transition to comfort measures then.  Hypokalemia.  Replaced.    Extremely poor oral intake,  dysphagia, frail status, unable to participate with PT OT, dehydration and hypernatremia.  Again  supportive care, D5W and monitor.  Prognosis very poor.  Patient therapy following.  Remains at risk for aspiration.  V-fib arrest Occurred during EGD 11/26 Echo with new onset HFrEF and RV systolic dysfunction, EF 35%. Neurology following poor candidate for invasive procedure or testing due to underlying recent diagnosis of stage IV gastric cancer, continue Coreg, cannot give aspirin or Plavix due to recent GI bleed from the gastric cancer.  Monitor with supportive care.  Combined distributive/hemorrhagic shock Required pressors briefly Resolved Transfuse as needed try to keep hemoglobin above 7.5, monitor CBC  Acute hypoxic respiratory failure in the setting of V-fib arrest Extubated 11/27 Currently on 2 L of oxygen Mobilize Pulmonary toileting  New diagnosis of HFrEF EF now 30 to 35%. On Coreg, not on ACE inhibitor, ARB or Entresto due to AKI, cardiology on board, limited options for treatment kindly see #1 above.  Try to give Coreg if he is able to swallow safely, keep electrolytes stable.  Chronic atrial fibrillation [place] vas 2 score of greater than 4. Telemetry monitoring Rate well-controlled off rate control agents Placed on oral Coreg and IV Lopressor for rate control, continue to monitor.  Superficial venous thrombus-left cephalic vein Supportive care Not a candidate for anticoagulation, poor candidate for it due to recent GI bleed from the recently diagnosed gastric cancer.  HTN BP soft but stable Watch closely.  AKI with hypernatremia, dehydration due to poor oral intake. He is dehydrated, D5W, oral diet, avoid nephrotoxins, continue Foley for now.  Acute metabolic encephalopathy Multifactorial etiology-from critical illness-ICU delirium/AKI Moving all 4 extremities-somewhat redirectable Maintain delirium precautions.  Dysphagia.  Due to encephalopathy and severe deconditioning.  Unable to take oral medications or diet, clear liquid diet if tolerated, speech  therapy, gentle D5 PRN, poor candidate for NG tube or PEG tube.    DM-2 (A1c 4.6 on 10/23) CBG stable on SSI  Recent Labs    07/30/23 0006 07/30/23 0352 07/30/23 0751  GLUCAP 80 76 74    Palliative care Poor overall prognosis, Full code for now, Discussed with daughter/spouse at bedside-they understand tenuous situation-poor overall prognosis. Wife and patient want to pursue aggressive measures.  Palliative care, oncology and PCCM will be involved to opine.  Nutrition Status: Nutrition Problem: Moderate Malnutrition Etiology: acute illness (recently diagnosed gastric cancer) Signs/Symptoms: mild muscle depletion, mild fat depletion Interventions: Ensure Enlive (each supplement provides 350kcal and 20 grams of protein), MVI  Pressure Ulcer: Agree with assessment and plan as outlined below Pressure Injury 07/20/23 Buttocks Left Stage 2 -  Partial thickness loss of dermis presenting as a shallow open injury with a red, pink wound bed without slough. (Active)  07/20/23 1845  Location: Buttocks  Location Orientation: Left  Staging: Stage 2 -  Partial thickness loss of dermis presenting as a shallow open injury with a red, pink wound bed without slough.  Wound Description (Comments):   Present on Admission: Yes  Dressing Type Foam - Lift dressing to assess site every shift 07/30/23 0400    BMI: Estimated body mass index is 28.13 kg/m as calculated from the following:   Height as of this encounter: 5\' 11"  (1.803 m).   Weight as of this encounter: 91.5 kg.   Code status:   Code Status: Limited: Do not attempt resuscitation (DNR) -DNR-LIMITED -Do Not Intubate/DNI    DVT Prophylaxis: SCDs Start: 07/19/23 1926   Family Communication:   Spouse  at  bedside on 07/25/2023, 07/26/2023, 07/29/2023, awaiting palliative care meeting tomorrow.  Called daughter [phone] on 07/26/2023 at 8:36 AM.  Message left  Daughter bedside on 07/26/2023 in detail, she has a good understanding that  her diet is not doing well and she will talk to her mother.   Disposition Plan: Status is: Inpatient Remains inpatient appropriate because: Severity of illness   Planned Discharge Destination:Skilled nursing facility   Diet: Diet Order             DIET - DYS 1 Room service appropriate? No; Fluid consistency: Thin  Diet effective now                     Antimicrobial agents: Anti-infectives (From admission, onward)    Start     Dose/Rate Route Frequency Ordered Stop   07/23/23 1845  cefTRIAXone (ROCEPHIN) 2 g in sodium chloride 0.9 % 100 mL IVPB  Status:  Discontinued        2 g 200 mL/hr over 30 Minutes Intravenous Every 24 hours 07/23/23 1756 07/29/23 1100   07/23/23 1845  metroNIDAZOLE (FLAGYL) IVPB 500 mg  Status:  Discontinued        500 mg 100 mL/hr over 60 Minutes Intravenous Every 12 hours 07/23/23 1756 07/29/23 1100        MEDICATIONS: Scheduled Meds:  carvedilol  6.25 mg Oral BID WC   Chlorhexidine Gluconate Cloth  6 each Topical Daily   docusate  100 mg Oral BID   feeding supplement  1 Container Oral TID BM   insulin aspart  0-6 Units Subcutaneous TID WC   pantoprazole (PROTONIX) IV  40 mg Intravenous Q12H   Continuous Infusions:   PRN Meds:.acetaminophen **OR** acetaminophen, bisacodyl, fentaNYL (SUBLIMAZE) injection, HYDROmorphone (DILAUDID) injection, ipratropium-albuterol, metoprolol tartrate, ondansetron **OR** ondansetron (ZOFRAN) IV, mouth rinse   I have personally reviewed following labs and imaging studies  LABORATORY DATA: CBC: Recent Labs  Lab 07/26/23 0441 07/27/23 0503 07/28/23 0438 07/29/23 0457 07/30/23 0411  WBC 3.8* 4.9 5.0 5.0 5.6  NEUTROABS 3.0 4.0 4.0 4.1 4.5  HGB 8.1* 8.1* 7.8* 7.7* 8.1*  HCT 26.6* 27.6* 25.8* 25.6* 26.6*  MCV 96.7 95.5 97.0 94.1 93.7  PLT 86* 83* 86* 87* 108*    Basic Metabolic Panel: Recent Labs  Lab 07/25/23 0652 07/26/23 0441 07/27/23 0503 07/28/23 0438 07/29/23 0457 07/30/23 0411  NA  154* 141 154* 148* 145 148*  K 3.5 3.1* 3.8 3.7 3.4* 3.8  CL 114* 106 119* 114* 108 113*  CO2 27 28 29 29 28 29   GLUCOSE 86 540* 95 136* 112* 82  BUN 41* 36* 34* 29* 25* 27*  CREATININE 2.36* 2.15* 2.02* 1.76* 1.55* 1.76*  CALCIUM 8.5* 7.5* 8.2* 7.7* 7.9* 8.3*  MG 2.4 2.2 2.3 2.1 2.0  --   PHOS 3.1 2.9 2.2* 3.5 2.6  --     GFR: Estimated Creatinine Clearance: 44.5 mL/min (A) (by C-G formula based on SCr of 1.76 mg/dL (H)).  Liver Function Tests: Recent Labs  Lab 07/24/23 0408  AST 13*  ALT 10  ALKPHOS 50  BILITOT 1.0  PROT 4.6*  ALBUMIN 1.9*   Urine analysis:    Component Value Date/Time   COLORURINE YELLOW 07/09/2023 1840   APPEARANCEUR HAZY (A) 07/09/2023 1840   APPEARANCEUR Clear 05/03/2023 1056   LABSPEC 1.020 07/09/2023 1840   PHURINE 5.0 07/09/2023 1840   GLUCOSEU NEGATIVE 07/09/2023 1840   HGBUR SMALL (A) 07/09/2023 1840   BILIRUBINUR NEGATIVE 07/09/2023 1840  BILIRUBINUR Negative 05/03/2023 1056   KETONESUR 5 (A) 07/09/2023 1840   PROTEINUR 100 (A) 07/09/2023 1840   NITRITE NEGATIVE 07/09/2023 1840   LEUKOCYTESUR NEGATIVE 07/09/2023 1840    Sepsis Labs: Lactic Acid, Venous    Component Value Date/Time   LATICACIDVEN 1.0 09/06/2017 2047    MICROBIOLOGY: Recent Results (from the past 240 hour(s))  MRSA Next Gen by PCR, Nasal     Status: None   Collection Time: 07/20/23  6:07 PM   Specimen: Nasal Mucosa; Nasal Swab  Result Value Ref Range Status   MRSA by PCR Next Gen NOT DETECTED NOT DETECTED Final    Comment: (NOTE) The GeneXpert MRSA Assay (FDA approved for NASAL specimens only), is one component of a comprehensive MRSA colonization surveillance program. It is not intended to diagnose MRSA infection nor to guide or monitor treatment for MRSA infections. Test performance is not FDA approved in patients less than 63 years old. Performed at [facility], [address] [place], [place] [address]     RADIOLOGY STUDIES/RESULTS: No  results found.   LOS: 11 days   Signature  -    Susa Raring M.D on 07/30/2023 at 11:06 AM   -  To page go to [email or web address]

## 2023-07-30 NOTE — Progress Notes (Signed)
Speech Language Pathology Treatment: Dysphagia  Patient Details Name: [name]. MRN: [id number] DOB: 07/16/1951 Today's Date: 07/30/2023 Time: 4098-1191 SLP Time Calculation (min) (ACUTE ONLY): 14 min  Assessment / Plan / Recommendation Clinical Impression  Notified that pt was holding and unable to transit puree or juice with OT earlier today. Pt intermittently holds food and has since initial assessment and intermittently coughs as did during session 12/4 with suspicion of intermittent aspiration. RN stated he coughed earlier with her today several times. With SLP he demonstrated prolonged oral transit- he makes attempts to transit bolus but takes him longer to achieve. He may have delayed swallow initiation to pharynx as unable to determine this via observation. He did not cough or exhibit other signs of aspiration with this SLP today. Pt also has history of reflux to mid/distal esophagus per UGI 1/18. There are multiple factors that affect pt's efficiency of oropharyngeal swallow and ability to achieve adequate intake. Puree continues to be appropriate as do not feel he will be able to effectively masticate bolus and transit, continue thin. Remain in upright position after meals. SLP will continue to see short term as pt's ability to progress appears limited.    HPI HPI: 72 year old man initially seen by speech 11/28 and found to have  multifactoral dysphagia involving deconditioning, cognition, and likely severe esophageal dysphagia and GI issues at baseline. Clear liquid diet recommended and ST signed off. Pt had been upgraded to soft diet and ST reordered for appropriate diet. Per chart pt remains critically ill following brief cardiac arrest during upper endoscopy for upper GI bleed from gastric malignancy.  Arrest likely due to combination of hypovolemia and anesthesia. Intubated 11/26-11/27. Pt vomited after extubation. RN concerned for regurgitation of bowel. Pt belching during swallow  screen attempts.  CXR prior to extubation: Increasing vascular congestion and perihilar airspace disease,  with trace bilateral pleural effusions, consistent with worsening  volume status and pulmonary edema  EGD 11/26 which showed a large fungating, ulcerated mass in the gastric antrum. Hemostasis was obtained with hemostatic spray. Patient had a brief cardiac arrest, requiring CPR, intubation, and transfer to [facility]. UGI on 11/18 - There is a moderate-to-large amount of reflux noted to the level of  the mid/distal esophagus. History of gastric outlet obstruction, post surgical  creation of a gastrojejunostomy.      SLP Plan  Continue with current plan of care      Recommendations for follow up therapy are one component of a multi-disciplinary discharge planning process, led by the attending physician.  Recommendations may be updated based on patient status, additional functional criteria and insurance authorization.    Recommendations  Diet recommendations: Dysphagia 1 (puree);Thin liquid Liquids provided via: Straw Medication Administration: Crushed with puree Supervision: Staff to assist with self feeding;Full supervision/cueing for compensatory strategies Compensations: Slow rate;Small sips/bites Postural Changes and/or Swallow Maneuvers: Upright 30-60 min after meal;Seated upright 90 degrees                  Oral care BID   Frequent or constant Supervision/Assistance Dysphagia, unspecified (R13.10)     Continue with current plan of care     Royce Macadamia  07/30/2023, 2:05 PM

## 2023-07-30 NOTE — Plan of Care (Signed)
  Problem: Education: Goal: Knowledge of General Education information will improve Description: Including pain rating scale, medication(s)/side effects and non-pharmacologic comfort measures Outcome: Progressing   Problem: Health Behavior/Discharge Planning: Goal: Ability to manage health-related needs will improve Outcome: Progressing   Problem: Clinical Measurements: Goal: Ability to maintain clinical measurements within normal limits will improve Outcome: Progressing Goal: Will remain free from infection Outcome: Progressing Goal: Diagnostic test results will improve Outcome: Progressing Goal: Respiratory complications will improve Outcome: Progressing Goal: Cardiovascular complication will be avoided Outcome: Progressing   Problem: Activity: Goal: Risk for activity intolerance will decrease Outcome: Progressing   Problem: Nutrition: Goal: Adequate nutrition will be maintained Outcome: Progressing   Problem: Coping: Goal: Level of anxiety will decrease Outcome: Progressing   Problem: Elimination: Goal: Will not experience complications related to bowel motility Outcome: Progressing Goal: Will not experience complications related to urinary retention Outcome: Progressing   Problem: Pain Management: Goal: General experience of comfort will improve Outcome: Progressing   Problem: Safety: Goal: Ability to remain free from injury will improve Outcome: Progressing   Problem: Skin Integrity: Goal: Risk for impaired skin integrity will decrease Outcome: Progressing   Problem: Education: Goal: Ability to describe self-care measures that may prevent or decrease complications (Diabetes Survival Skills Education) will improve Outcome: Progressing Goal: Individualized Educational Video(s) Outcome: Progressing   Problem: Coping: Goal: Ability to adjust to condition or change in health will improve Outcome: Progressing   Problem: Fluid Volume: Goal: Ability to  maintain a balanced intake and output will improve Outcome: Progressing   Problem: Health Behavior/Discharge Planning: Goal: Ability to identify and utilize available resources and services will improve Outcome: Progressing Goal: Ability to manage health-related needs will improve Outcome: Progressing   Problem: Metabolic: Goal: Ability to maintain appropriate glucose levels will improve Outcome: Progressing   Problem: Nutritional: Goal: Maintenance of adequate nutrition will improve Outcome: Progressing Goal: Progress toward achieving an optimal weight will improve Outcome: Progressing   Problem: Skin Integrity: Goal: Risk for impaired skin integrity will decrease Outcome: Progressing   Problem: Tissue Perfusion: Goal: Adequacy of tissue perfusion will improve Outcome: Progressing   Problem: Activity: Goal: Ability to tolerate increased activity will improve Outcome: Progressing   Problem: Respiratory: Goal: Ability to maintain a clear airway and adequate ventilation will improve Outcome: Progressing   Problem: Role Relationship: Goal: Method of communication will improve Outcome: Progressing   Problem: Education: Goal: Ability to identify signs and symptoms of gastrointestinal bleeding will improve Outcome: Progressing   Problem: Bowel/Gastric: Goal: Will show no signs and symptoms of gastrointestinal bleeding Outcome: Progressing   Problem: Fluid Volume: Goal: Will show no signs and symptoms of excessive bleeding Outcome: Progressing   Problem: Clinical Measurements: Goal: Complications related to the disease process, condition or treatment will be avoided or minimized Outcome: Progressing

## 2023-07-30 NOTE — Progress Notes (Signed)
Daily Progress Note   Patient Name: [name].       Date: 07/30/2023 DOB: 02-15-1951  Age: 72 y.o. MRN#: [id number] Attending Physician: Leroy Sea, MD Primary Care Physician: [name], MD Admit Date: 07/19/2023  Reason for Consultation/Follow-up: Establishing goals of care  Subjective: No complaints, sitting up in chair - [place] and RN reports poor intake - attempts to feed him and many time he holds it in his mouth without swallowing however was able to take some meds in applesauce, loss of IV access - RN attempting to place PIV today  Length of Stay: 11  Current Medications: Scheduled Meds:   carvedilol  6.25 mg Oral BID WC   Chlorhexidine Gluconate Cloth  6 each Topical Daily   docusate  100 mg Oral BID   feeding supplement  1 Container Oral TID BM   insulin aspart  0-6 Units Subcutaneous TID WC   pantoprazole (PROTONIX) IV  40 mg Intravenous Q12H    Continuous Infusions:   PRN Meds: acetaminophen **OR** acetaminophen, bisacodyl, fentaNYL (SUBLIMAZE) injection, HYDROmorphone (DILAUDID) injection, ipratropium-albuterol, metoprolol tartrate, ondansetron **OR** ondansetron (ZOFRAN) IV, mouth rinse  Physical Exam Constitutional:      General: He is not in acute distress.    Appearance: He is ill-appearing.     Comments: Sitting in chair drowsy  Cardiovascular:     Rate and Rhythm: Tachycardia present.  Pulmonary:     Effort: Pulmonary effort is normal.  Skin:    General: Skin is warm and dry.  Neurological:     Mental Status: He is disoriented.             Vital Signs: BP 114/73 (BP Location: Right Arm)   Pulse (!) 127   Temp 97.7 F (36.5 C) (Oral)   Resp (!) 24   Ht 5\' 11"  (1.803 m)   Wt 91.5 kg   SpO2 100%   BMI 28.13  kg/m  SpO2: SpO2: 100 % O2 Device: O2 Device: Nasal Cannula O2 Flow Rate: O2 Flow Rate (L/min): 2 L/min  Intake/output summary:  Intake/Output Summary (Last 24 hours) at 07/30/2023 1413 Last data filed at 07/30/2023 1400 Gross per 24 hour  Intake 225.88 ml  Output 400 ml  Net -174.12 ml   LBM: Last BM Date : 07/29/23 Baseline Weight: Weight: 90.5 kg Most recent weight: Weight: 91.5 kg       Palliative Assessment/Data: PPS 40%      Patient Active Problem List   Diagnosis Date Noted   PAF (paroxysmal atrial fibrillation) (HCC) 07/27/2023   Cardiac arrest (HCC) 07/26/2023   Failure to thrive in adult 07/25/2023   ABLA (acute blood loss anemia) 07/20/2023   Acute post-hemorrhagic anemia 07/20/2023   GI bleed 07/19/2023   Superficial vein thrombosis 07/19/2023   Acute renal failure superimposed on stage 3a chronic kidney disease (HCC) 07/08/2023   Primary gastric signet ring cell carcinoma (HCC) 07/05/2023   Gastric mass 07/02/2023   Malnutrition of moderate degree 07/02/2023   Gastric outlet obstruction 07/01/2023   Aortic atherosclerosis (HCC) 08/27/2022   Malignant neoplasm of prostate (HCC) 06/22/2022   Elevated PSA 05/14/2022   Vitamin D  deficiency 01/25/2020   Uncontrolled type 2 diabetes mellitus with hyperglycemia (HCC) 12/14/2019   Cellulitis of lower extremity 12/14/2019   Autoimmune hemolytic anemia due to IgG (HCC)    Symptomatic anemia    Anemia 08/02/2019   Macrocytic anemia 08/02/2019   Atrial flutter (HCC) 09/07/2017   Bilateral pneumonia 09/07/2017   Acute respiratory failure with hypoxia (HCC) 05/19/2017   AKI (acute kidney injury) (HCC)    Sepsis due to pneumonia (HCC) 05/18/2017   Type 2 diabetes mellitus with complication, without long-term current use of insulin (HCC) 02/19/2017   Essential hypertension, benign 04/03/2015   IDA (iron deficiency anemia)    Mucosal abnormality of stomach    Hx of adenomatous colonic polyps 06/13/2012   Status  post autologous bone marrow transplantation (HCC) 07/15/2011   Venous insufficiency 04/29/2011   DLBCL (diffuse large B cell lymphoma) (HCC) 02/28/2009   Mixed hyperlipidemia 02/28/2009   Class 2 severe obesity due to excess calories with serious comorbidity and body mass index (BMI) of 36.0 to 36.9 in adult [facility]) 02/28/2009   DIASTOLIC HEART FAILURE, CHRONIC 02/28/2009   EDEMA 02/28/2009    Palliative Care Assessment & Plan   HPI: 72 y.o. male with past medical history of a flutter, autoimmune hemolytic anemia, CKD 3, diastolic heart failure, HTN/HLD, newly diagnosed gastric mass, diabetes, recent hospital stay with discharged to [facility] for short-term rehab admitted on 07/19/2023 with GI bleed. Hospitalization 11/7 - 11/21 for gastric outlet obstruction due to gastric adenocarcinoma with palliative gastrojejunostomy 11/13. EGD revealed large circumferential fungating malignant gastric tumor.   Assessment: Follow up today with family and patient. Assessment of patient - he is disoriented, no complaints. Poor PO intake per staff. No family at bedside. No PIV - RN plans to reattempt.   Spoke with wife - tells me meeting tomorrow would be better. She requests meeting 12/7 at 11 am. We review patient condition today. She has questions about skin breakdown and IV status - these were addressed. She understands need for more goals of care discussions but prefers to do this with more family present so we delayed further conversation until tomorrow.   Recommendations/Plan: DNR/DNI per previous conversations Planning for family meeting (to include spouse, daughter, and sister) tomorrow 12/7 at 20 AM per wife's request  Code Status: DNR  Care plan was discussed with patient's wife and nursing staff  Thank you for allowing the Palliative Medicine Team to assist in the care of this patient.   Total Time 40 minutes Prolonged Time Billed  no   Time spent includes: Detailed review of medical  records (labs, imaging, vital signs), medically appropriate exam, discussion with treatment team, counseling and educating patient, family and/or staff, documenting clinical information, medication management and coordination of care.     *Please note that this is a verbal dictation therefore any spelling or grammatical errors are due to the "Dragon Medical One" system interpretation.  Gerlean Ren, DNP, [facility] Palliative Medicine Team Team Phone # [phone]  Pager [phone]

## 2023-07-31 DIAGNOSIS — I5021 Acute systolic (congestive) heart failure: Secondary | ICD-10-CM | POA: Diagnosis not present

## 2023-07-31 LAB — GLUCOSE, CAPILLARY
Glucose-Capillary: 119 mg/dL — ABNORMAL HIGH (ref 70–99)
Glucose-Capillary: 121 mg/dL — ABNORMAL HIGH (ref 70–99)
Glucose-Capillary: 125 mg/dL — ABNORMAL HIGH (ref 70–99)
Glucose-Capillary: 130 mg/dL — ABNORMAL HIGH (ref 70–99)
Glucose-Capillary: 131 mg/dL — ABNORMAL HIGH (ref 70–99)

## 2023-07-31 MED ORDER — GLYCOPYRROLATE 0.2 MG/ML IJ SOLN
0.2000 mg | INTRAMUSCULAR | Status: DC | PRN
  Administered 2023-08-01: 0.2 mg via INTRAVENOUS
  Filled 2023-07-31: qty 1

## 2023-07-31 MED ORDER — HYDROMORPHONE HCL 1 MG/ML IJ SOLN
0.5000 mg | INTRAMUSCULAR | Status: DC | PRN
  Administered 2023-08-02: 0.5 mg via INTRAVENOUS
  Filled 2023-07-31: qty 0.5

## 2023-07-31 NOTE — Plan of Care (Signed)
  Problem: Education: Goal: Knowledge of General Education information will improve Description: Including pain rating scale, medication(s)/side effects and non-pharmacologic comfort measures Outcome: Progressing   Problem: Health Behavior/Discharge Planning: Goal: Ability to manage health-related needs will improve Outcome: Progressing   Problem: Clinical Measurements: Goal: Ability to maintain clinical measurements within normal limits will improve Outcome: Progressing Goal: Will remain free from infection Outcome: Progressing Goal: Diagnostic test results will improve Outcome: Progressing Goal: Respiratory complications will improve Outcome: Progressing Goal: Cardiovascular complication will be avoided Outcome: Progressing   Problem: Activity: Goal: Risk for activity intolerance will decrease Outcome: Progressing   Problem: Nutrition: Goal: Adequate nutrition will be maintained Outcome: Progressing   Problem: Coping: Goal: Level of anxiety will decrease Outcome: Progressing   Problem: Elimination: Goal: Will not experience complications related to bowel motility Outcome: Progressing Goal: Will not experience complications related to urinary retention Outcome: Progressing   Problem: Pain Management: Goal: General experience of comfort will improve Outcome: Progressing   Problem: Safety: Goal: Ability to remain free from injury will improve Outcome: Progressing   Problem: Skin Integrity: Goal: Risk for impaired skin integrity will decrease Outcome: Progressing   Problem: Education: Goal: Ability to describe self-care measures that may prevent or decrease complications (Diabetes Survival Skills Education) will improve Outcome: Progressing Goal: Individualized Educational Video(s) Outcome: Progressing   Problem: Coping: Goal: Ability to adjust to condition or change in health will improve Outcome: Progressing   Problem: Fluid Volume: Goal: Ability to  maintain a balanced intake and output will improve Outcome: Progressing   Problem: Health Behavior/Discharge Planning: Goal: Ability to identify and utilize available resources and services will improve Outcome: Progressing Goal: Ability to manage health-related needs will improve Outcome: Progressing   Problem: Metabolic: Goal: Ability to maintain appropriate glucose levels will improve Outcome: Progressing   Problem: Nutritional: Goal: Maintenance of adequate nutrition will improve Outcome: Progressing Goal: Progress toward achieving an optimal weight will improve Outcome: Progressing   Problem: Skin Integrity: Goal: Risk for impaired skin integrity will decrease Outcome: Progressing   Problem: Tissue Perfusion: Goal: Adequacy of tissue perfusion will improve Outcome: Progressing   Problem: Activity: Goal: Ability to tolerate increased activity will improve Outcome: Progressing   Problem: Respiratory: Goal: Ability to maintain a clear airway and adequate ventilation will improve Outcome: Progressing   Problem: Role Relationship: Goal: Method of communication will improve Outcome: Progressing   Problem: Education: Goal: Ability to identify signs and symptoms of gastrointestinal bleeding will improve Outcome: Progressing   Problem: Bowel/Gastric: Goal: Will show no signs and symptoms of gastrointestinal bleeding Outcome: Progressing   Problem: Fluid Volume: Goal: Will show no signs and symptoms of excessive bleeding Outcome: Progressing   Problem: Clinical Measurements: Goal: Complications related to the disease process, condition or treatment will be avoided or minimized Outcome: Progressing

## 2023-07-31 NOTE — Progress Notes (Addendum)
Daily Progress Note   Patient Name: [name].       Date: 07/31/2023 DOB: 13-Mar-1951  Age: 72 y.o. MRN#: [id number] Attending Physician: Leroy Sea, MD Primary Care Physician: [name], MD Admit Date: 07/19/2023  Reason for Consultation/Follow-up: Establishing goals of care  Subjective: Patient not interactive today.  Length of Stay: 12  Current Medications: Scheduled Meds:   carvedilol  6.25 mg Oral BID WC   Chlorhexidine Gluconate Cloth  6 each Topical Daily   docusate  100 mg Oral BID   feeding supplement  1 Container Oral TID BM   insulin aspart  0-6 Units Subcutaneous TID WC   pantoprazole (PROTONIX) IV  40 mg Intravenous Q12H    Continuous Infusions:  dextrose 50 mL/hr at 07/31/23 0627    PRN Meds: acetaminophen **OR** acetaminophen, bisacodyl, fentaNYL (SUBLIMAZE) injection, HYDROmorphone (DILAUDID) injection, ipratropium-albuterol, metoprolol tartrate, ondansetron **OR** ondansetron (ZOFRAN) IV, mouth rinse  Physical Exam Constitutional:      General: He is not in acute distress.    Appearance: He is ill-appearing.     Comments: Does not wake to voice or gentle touch  Pulmonary:     Effort: Pulmonary effort is normal.  Skin:    General: Skin is warm and dry.  Neurological:     Mental Status: He is disoriented.             Vital Signs: BP (!) 99/54 (BP Location: Right Arm)   Pulse 86   Temp 98.3 F (36.8 C) (Oral)   Resp (!) 21   Ht 5\' 11"  (1.803 m)   Wt 91.5 kg   SpO2 100%   BMI 28.13 kg/m  SpO2: SpO2: 100 % O2 Device: O2 Device: Nasal Cannula O2 Flow Rate: O2 Flow Rate (L/min): 2 L/min  Intake/output summary:  Intake/Output Summary (Last 24 hours) at 07/31/2023 1041 Last data filed at 07/31/2023 8657 Gross per 24 hour  Intake 639.86 ml   Output 450 ml  Net 189.86 ml   LBM: Last BM Date : 07/30/23 Baseline Weight: Weight: 90.5 kg Most recent weight: Weight: 91.5 kg       Palliative Assessment/Data: PPS 40%      Patient Active Problem List   Diagnosis Date Noted   PAF (paroxysmal atrial fibrillation) (HCC) 07/27/2023   Cardiac arrest (HCC) 07/26/2023   Failure to thrive in adult 07/25/2023   ABLA (acute blood loss anemia) 07/20/2023   Acute post-hemorrhagic anemia 07/20/2023   GI bleed 07/19/2023   Superficial vein thrombosis 07/19/2023   Acute renal failure superimposed on stage 3a chronic kidney disease (HCC) 07/08/2023   Primary gastric signet ring cell carcinoma (HCC) 07/05/2023   Gastric mass 07/02/2023   Malnutrition of moderate degree 07/02/2023   Gastric outlet obstruction 07/01/2023   Aortic atherosclerosis (HCC) 08/27/2022   Malignant neoplasm of prostate (HCC) 06/22/2022   Elevated PSA 05/14/2022   Vitamin D deficiency 01/25/2020   Uncontrolled type 2 diabetes mellitus with hyperglycemia (HCC) 12/14/2019   Cellulitis of lower extremity 12/14/2019   Autoimmune hemolytic anemia due to IgG (HCC)    Symptomatic anemia    Anemia 08/02/2019   Macrocytic anemia 08/02/2019   Atrial flutter (HCC) 09/07/2017  Bilateral pneumonia 09/07/2017   Acute respiratory failure with hypoxia (HCC) 05/19/2017   AKI (acute kidney injury) (HCC)    Sepsis due to pneumonia (HCC) 05/18/2017   Type 2 diabetes mellitus with complication, without long-term current use of insulin (HCC) 02/19/2017   Essential hypertension, benign 04/03/2015   IDA (iron deficiency anemia)    Mucosal abnormality of stomach    Hx of adenomatous colonic polyps 06/13/2012   Status post autologous bone marrow transplantation (HCC) 07/15/2011   Venous insufficiency 04/29/2011   DLBCL (diffuse large B cell lymphoma) (HCC) 02/28/2009   Mixed hyperlipidemia 02/28/2009   Class 2 severe obesity due to excess calories with serious comorbidity and  body mass index (BMI) of 36.0 to 36.9 in adult [facility]) 02/28/2009   DIASTOLIC HEART FAILURE, CHRONIC 02/28/2009   EDEMA 02/28/2009    Palliative Care Assessment & Plan   HPI: 72 y.o. male with past medical history of a flutter, autoimmune hemolytic anemia, CKD 3, diastolic heart failure, HTN/HLD, newly diagnosed gastric mass, diabetes, recent hospital stay with discharged to [facility] for short-term rehab admitted on 07/19/2023 with GI bleed. Hospitalization 11/7 - 11/21 for gastric outlet obstruction due to gastric adenocarcinoma with palliative gastrojejunostomy 11/13. EGD revealed large circumferential fungating malignant gastric tumor.   Assessment: Family meeting today in include patient's wife [name], daughter Elson Clan, and sister Windell Moulding.   I introduced Palliative Medicine as specialized medical care for people living with serious illness. It focuses on providing relief from the symptoms and stress of a serious illness. The goal is to improve quality of life for both the patient and the family.   Family shares they understand from the medical team that patient is not improving with current interventions and patient is not a candidate for chemotherapy.  We discuss possibility of transitioning focus from aggressive care to focusing on patient's comfort. Patient's daughter and sister readily agree however wife expresses concerns about what kind of care patient will receive. I explained comfort care focused on how the patient is looking and feeling. This would include management of any symptoms that may cause discomfort, pain, shortness of breath/air hunger, increased work of breathing, cough, nausea, agitation/restlessness, anxiety, and/or secretions etc. Symptoms would be managed with medications and other non-pharmacological interventions such as spiritual support if requested, repositioning, music therapy, or therapeutic listening. We discussed avoiding medical interventions that could cause  discomfort. Wife does request that while patient remains hospitalized he remains on oxygen and cardiac monitor.  We reviewed option of hospice care - family unable to care for hiim at home and so we specifically discussed care at a hospice facility. Family understands all care is focused on comfort. Wife understands patient will not remain on cardiac monitor at hospice facility. Family agreeable to hospice facility referral - they request [facility] in [place].    Recommendations/Plan: DNR/DNI per previous conversations Comfort care - somewhat modified as wife requests we continue oxygen and cardiac monitoring while hospitalized until he transitions to hospice facility, will also continue coreg, lopressor as these medications are also important to wife as we await hospice facility placement [facility] team alerted to family request for [facility] Patient comfortable during my assessment, has PRN dialudid available, will increase frequency - will add PRN robinul  Code Status: DNR  Care plan was discussed with patient's wife, sister, and daughter; Dr Thedore Mins; [facility]  Thank you for allowing the Palliative Medicine Team to assist in the care of this patient.   Total Time 60 minutes Prolonged  Time Billed  no  Time spent includes: Detailed review of medical records (labs, imaging, vital signs), medically appropriate exam, discussion with treatment team, counseling and educating patient, family and/or staff, documenting clinical information, medication management and coordination of care.     *Please note that this is a verbal dictation therefore any spelling or grammatical errors are due to the "Dragon Medical One" system interpretation.  Gerlean Ren, DNP, [facility] Palliative Medicine Team Team Phone # [phone]  Pager [phone]

## 2023-07-31 NOTE — Progress Notes (Signed)
PROGRESS NOTE        PATIENT DETAILS Name: [name]. Age: 72 y.o. Sex: male Date of Birth: [birth year] Admit Date: 07/19/2023 Admitting Physician Ejiroghene Wendall Stade, MD [id number], Lyman Bishop, MD  Brief Summary: Patient is a 72 y.o.  male who was recently diagnosed with gastric outlet obstruction  secondary to gastric adenocarcinoma-s/p palliative gastrojejunostomy on 11/13-presented to the hospital with upper GI bleeding in the setting of Eliquis for A-fib.  Initially admitted at [facility] he was stabilized-underwent EGD-which was complicated by V-fib arrest.  Patient was intubated and subsequently transferred to ICU here at [facility].  Echocardiogram showed new systolic dysfunction-patient was stabilized-extubated and subsequently transferred to [facility] on 11/29.  See below for further details.  Significant events: 11/25>> admit to [facility]-upper GI bleed-acute blood loss anemia 11/26>> EGD-active bleeding-successful hemostasis-but had V-fib arrest-intubated-transferred to ICU [facility]. 11/27>> extubated 11/29>> transferred to [facility].  Significant studies: 11/27>> EF 30-35%, RV systolic function moderately reduced.  RVSP 53.9.  Significant microbiology data: None  Procedures: 11/26>> [id number], fungating and ulcerated, circumferential mass with oozing bleeding and stigmata of recent bleeding was found in the gastric antrum.  Hemostatic spray applied. 11/26-11/27>> ETT  Consults: GI Palliative care PCCM Cardiology Oncology  Subjective:  Patient in bed, appears comfortable but somnolent, very frail, denies any headache chest or abdominal pain.   Objective: Vitals: Blood pressure (!) 99/54, pulse 86, temperature 98.3 F (36.8 C), temperature source Oral, resp. rate (!) 21, height 5\' 11"  (1.803 m), weight 91.5 kg, SpO2 100%.   Exam:  Awake, no focal deficits, extremely frail and weak, weak cough reflex [place].AT,PERRAL Supple Neck, No JVD,   Symmetrical Chest wall  movement, Good air movement bilaterally, CTAB RRR,No Gallops, Rubs or new Murmurs,  +ve B.Sounds, Abd Soft, No tenderness,   No Cyanosis, Clubbing or edema   Assessment/Plan:  Upper GI bleeding with acute blood loss anemia Secondary to bleeding gastric malignancy-s/p EGD with hemostatic spray 11/26 Required 3 units of PRBC so far-last transfused 11/27 Type screen repeated again on 07/25/2023 after IV fluids expected to drop, will likely require more transfusions in the next few days.  Continue PPI IV.  Recent diagnosis of gastric outlet obstruction secondary to gastric adenocarcinoma-s/p palliative gastrojejunostomy on 11/13-Dr. Pappayliou at [facility] Follow with Dr. Frances Maywood at [facility], palliative care also on board but family and patient wish all aggressive treatment, in my opinion he is a candidate for medical treatment directed towards comfort, he should be DNR but family and patient resistant.    Case discussed with critical care Dr. Karie Fetch, oncologist Dr. Arlana Pouch & palliative care team in detail on 07/25/2023, all providers agree that patient has untreatable underlying condition with extremely poor prognosis, very poor functional status.  They all suggest pursuing comfort measures and hospice after much consultation wife is agreed for DNR DNI on 07/27/2023, continue medical treatment otherwise.  Have a long discussion with patient's daughter on 07/27/2023 over the hospital room phone, she has a good understanding that her dad is not doing well and she will talk to her mother about future course of action, really told her that her dad/patient is currently dying and the best course of action is hospice.  Continue counseling.  Family meeting with palliative care on 07/31/2023 likely will transition to comfort measures then.  Hypokalemia.  Replaced.    Extremely poor oral  intake, dysphagia, frail status, unable to participate with PT OT, dehydration and hypernatremia.  Again  supportive care, D5W and monitor.  Prognosis very poor.  Patient therapy following.  Remains at risk for aspiration.  V-fib arrest Occurred during EGD 11/26 Echo with new onset HFrEF and RV systolic dysfunction, EF 35%. Neurology following poor candidate for invasive procedure or testing due to underlying recent diagnosis of stage IV gastric cancer, continue Coreg, cannot give aspirin or Plavix due to recent GI bleed from the gastric cancer.  Monitor with supportive care.  Combined distributive/hemorrhagic shock Required pressors briefly Resolved Transfuse as needed try to keep hemoglobin above 7.5, monitor CBC  Acute hypoxic respiratory failure in the setting of V-fib arrest Extubated 11/27 Currently on 2 L of oxygen Mobilize Pulmonary toileting  New diagnosis of HFrEF EF now 30 to 35%. On Coreg, not on ACE inhibitor, ARB or Entresto due to AKI, cardiology on board, limited options for treatment kindly see #1 above.  Try to give Coreg if he is able to swallow safely, keep electrolytes stable.  Chronic atrial fibrillation [place] vas 2 score of greater than 4. Telemetry monitoring Rate well-controlled off rate control agents Placed on oral Coreg and IV Lopressor for rate control, continue to monitor.  Superficial venous thrombus-left cephalic vein Supportive care Not a candidate for anticoagulation, poor candidate for it due to recent GI bleed from the recently diagnosed gastric cancer.  HTN BP soft but stable Watch closely.  AKI with hypernatremia, dehydration due to poor oral intake. He is dehydrated, D5W, oral diet, avoid nephrotoxins, continue Foley for now.  Acute metabolic encephalopathy Multifactorial etiology-from critical illness-ICU delirium/AKI Moving all 4 extremities-somewhat redirectable Maintain delirium precautions.  Dysphagia.  Due to encephalopathy and severe deconditioning.  Unable to take oral medications or diet, clear liquid diet if tolerated, speech  therapy, gentle D5 PRN, poor candidate for NG tube or PEG tube.    DM-2 (A1c 4.6 on 10/23) CBG stable on SSI  Recent Labs    07/31/23 0050 07/31/23 0404 07/31/23 0831  GLUCAP 121* 119* 130*    Palliative care Poor overall prognosis, Full code for now, Discussed with daughter/spouse at bedside-they understand tenuous situation-poor overall prognosis. Wife and patient want to pursue aggressive measures.  Palliative care, oncology and PCCM will be involved to opine.  Nutrition Status: Nutrition Problem: Moderate Malnutrition Etiology: acute illness (recently diagnosed gastric cancer) Signs/Symptoms: mild muscle depletion, mild fat depletion Interventions: Ensure Enlive (each supplement provides 350kcal and 20 grams of protein), MVI  Pressure Ulcer: Agree with assessment and plan as outlined below Pressure Injury 07/20/23 Buttocks Left Stage 2 -  Partial thickness loss of dermis presenting as a shallow open injury with a red, pink wound bed without slough. (Active)  07/20/23 1845  Location: Buttocks  Location Orientation: Left  Staging: Stage 2 -  Partial thickness loss of dermis presenting as a shallow open injury with a red, pink wound bed without slough.  Wound Description (Comments):   Present on Admission: Yes  Dressing Type Foam - Lift dressing to assess site every shift 07/30/23 2000    BMI: Estimated body mass index is 28.13 kg/m as calculated from the following:   Height as of this encounter: 5\' 11"  (1.803 m).   Weight as of this encounter: 91.5 kg.   Code status:   Code Status: Limited: Do not attempt resuscitation (DNR) -DNR-LIMITED -Do Not Intubate/DNI    DVT Prophylaxis: SCDs Start: 07/19/23 1926   Family Communication:   Spouse  at bedside on 07/25/2023, 07/26/2023, 07/29/2023, awaiting palliative care meeting tomorrow.  Called daughter [phone] on 07/26/2023 at 8:36 AM.  Message left  Daughter bedside on 07/26/2023 in detail, she has a good understanding  that her diet is not doing well and she will talk to her mother.   Disposition Plan: Status is: Inpatient Remains inpatient appropriate because: Severity of illness   Planned Discharge Destination:Skilled nursing facility   Diet: Diet Order             DIET - DYS 1 Room service appropriate? No; Fluid consistency: Thin  Diet effective now                     Antimicrobial agents: Anti-infectives (From admission, onward)    Start     Dose/Rate Route Frequency Ordered Stop   07/23/23 1845  cefTRIAXone (ROCEPHIN) 2 g in sodium chloride 0.9 % 100 mL IVPB  Status:  Discontinued        2 g 200 mL/hr over 30 Minutes Intravenous Every 24 hours 07/23/23 1756 07/29/23 1100   07/23/23 1845  metroNIDAZOLE (FLAGYL) IVPB 500 mg  Status:  Discontinued        500 mg 100 mL/hr over 60 Minutes Intravenous Every 12 hours 07/23/23 1756 07/29/23 1100        MEDICATIONS: Scheduled Meds:  carvedilol  6.25 mg Oral BID WC   Chlorhexidine Gluconate Cloth  6 each Topical Daily   docusate  100 mg Oral BID   feeding supplement  1 Container Oral TID BM   insulin aspart  0-6 Units Subcutaneous TID WC   pantoprazole (PROTONIX) IV  40 mg Intravenous Q12H   Continuous Infusions:  dextrose 50 mL/hr at 07/31/23 0627    PRN Meds:.acetaminophen **OR** acetaminophen, bisacodyl, fentaNYL (SUBLIMAZE) injection, HYDROmorphone (DILAUDID) injection, ipratropium-albuterol, metoprolol tartrate, ondansetron **OR** ondansetron (ZOFRAN) IV, mouth rinse   I have personally reviewed following labs and imaging studies  LABORATORY DATA: CBC: Recent Labs  Lab 07/26/23 0441 07/27/23 0503 07/28/23 0438 07/29/23 0457 07/30/23 0411  WBC 3.8* 4.9 5.0 5.0 5.6  NEUTROABS 3.0 4.0 4.0 4.1 4.5  HGB 8.1* 8.1* 7.8* 7.7* 8.1*  HCT 26.6* 27.6* 25.8* 25.6* 26.6*  MCV 96.7 95.5 97.0 94.1 93.7  PLT 86* 83* 86* 87* 108*    Basic Metabolic Panel: Recent Labs  Lab 07/25/23 0652 07/26/23 0441 07/27/23 0503  07/28/23 0438 07/29/23 0457 07/30/23 0411  NA 154* 141 154* 148* 145 148*  K 3.5 3.1* 3.8 3.7 3.4* 3.8  CL 114* 106 119* 114* 108 113*  CO2 27 28 29 29 28 29   GLUCOSE 86 540* 95 136* 112* 82  BUN 41* 36* 34* 29* 25* 27*  CREATININE 2.36* 2.15* 2.02* 1.76* 1.55* 1.76*  CALCIUM 8.5* 7.5* 8.2* 7.7* 7.9* 8.3*  MG 2.4 2.2 2.3 2.1 2.0  --   PHOS 3.1 2.9 2.2* 3.5 2.6  --     GFR: Estimated Creatinine Clearance: 44.5 mL/min (A) (by C-G formula based on SCr of 1.76 mg/dL (H)).  Liver Function Tests: No results for input(s): "AST", "ALT", "ALKPHOS", "BILITOT", "PROT", "ALBUMIN" in the last 168 hours.  Urine analysis:    Component Value Date/Time   COLORURINE YELLOW 07/09/2023 1840   APPEARANCEUR HAZY (A) 07/09/2023 1840   APPEARANCEUR Clear 05/03/2023 1056   LABSPEC 1.020 07/09/2023 1840   PHURINE 5.0 07/09/2023 1840   GLUCOSEU NEGATIVE 07/09/2023 1840   HGBUR SMALL (A) 07/09/2023 1840   BILIRUBINUR NEGATIVE 07/09/2023 1840  BILIRUBINUR Negative 05/03/2023 1056   KETONESUR 5 (A) 07/09/2023 1840   PROTEINUR 100 (A) 07/09/2023 1840   NITRITE NEGATIVE 07/09/2023 1840   LEUKOCYTESUR NEGATIVE 07/09/2023 1840    Sepsis Labs: Lactic Acid, Venous    Component Value Date/Time   LATICACIDVEN 1.0 09/06/2017 2047    MICROBIOLOGY: No results found for this or any previous visit (from the past 240 hour(s)).   RADIOLOGY STUDIES/RESULTS: No results found.   LOS: 12 days   Signature  -    Susa Raring M.D on 07/31/2023 at 9:15 AM   -  To page go to [email or web address]

## 2023-08-01 DIAGNOSIS — I5021 Acute systolic (congestive) heart failure: Secondary | ICD-10-CM | POA: Diagnosis not present

## 2023-08-01 DIAGNOSIS — K922 Gastrointestinal hemorrhage, unspecified: Secondary | ICD-10-CM | POA: Diagnosis not present

## 2023-08-01 DIAGNOSIS — K921 Melena: Secondary | ICD-10-CM | POA: Diagnosis not present

## 2023-08-01 DIAGNOSIS — D649 Anemia, unspecified: Secondary | ICD-10-CM | POA: Diagnosis not present

## 2023-08-01 DIAGNOSIS — Z515 Encounter for palliative care: Secondary | ICD-10-CM | POA: Diagnosis not present

## 2023-08-01 LAB — GLUCOSE, CAPILLARY
Glucose-Capillary: 104 mg/dL — ABNORMAL HIGH (ref 70–99)
Glucose-Capillary: 107 mg/dL — ABNORMAL HIGH (ref 70–99)
Glucose-Capillary: 116 mg/dL — ABNORMAL HIGH (ref 70–99)

## 2023-08-01 MED ORDER — BOOST / RESOURCE BREEZE PO LIQD CUSTOM: 1.0000 | ORAL | Status: DC | PRN

## 2023-08-01 MED ORDER — DEXTROSE 5 % IV SOLN
INTRAVENOUS | Status: AC
Start: 2023-08-01 — End: 2023-08-02

## 2023-08-01 NOTE — Progress Notes (Addendum)
PROGRESS NOTE        PATIENT DETAILS Name: [name]. Age: 72 y.o. Sex: male Date of Birth: 04-Mar-1951 Admit Date: 07/19/2023 Admitting Physician Frank Wendall Stade, MD [id number], Frank Bishop, MD  Brief Summary: Patient is a 72 y.o.  male who was recently diagnosed with gastric outlet obstruction  secondary to gastric adenocarcinoma-s/p palliative gastrojejunostomy on 11/13-presented to the hospital with upper GI bleeding in the setting of Eliquis for A-fib.  Initially admitted at [facility] he was stabilized-underwent EGD-which was complicated by V-fib arrest.  Patient was intubated and subsequently transferred to ICU here at [facility].  Echocardiogram showed new systolic dysfunction-patient was stabilized-extubated and subsequently transferred to [facility] on 11/29.  See below for further details.  Significant events: 11/25>> admit to [facility]-upper GI bleed-acute blood loss anemia 11/26>> EGD-active bleeding-successful hemostasis-but had V-fib arrest-intubated-transferred to ICU [facility]. 11/27>> extubated 11/29>> transferred to [facility].  Significant studies: 11/27>> EF 30-35%, RV systolic function moderately reduced.  RVSP 53.9.  Significant microbiology data: None  Procedures: 11/26>> [id number], fungating and ulcerated, circumferential mass with oozing bleeding and stigmata of recent bleeding was found in the gastric antrum.  Hemostatic spray applied. 11/26-11/27>> ETT  Consults: GI Palliative care PCCM Cardiology Oncology  Subjective:  Patient in bed, appears comfortable but somnolent, very frail, denies any headache chest or abdominal pain.   Objective: Vitals: Blood pressure (!) 97/59, pulse 90, temperature 98.6 F (37 C), temperature source Axillary, resp. rate 20, height 5\' 11"  (1.803 m), weight 91.5 kg, SpO2 100%.   Exam:  Awake, no focal deficits, extremely frail and weak, weak cough reflex [place].AT,PERRAL Supple Neck, No JVD,   Symmetrical Chest wall  movement, Good air movement bilaterally, CTAB RRR,No Gallops, Rubs or new Murmurs,  +ve B.Sounds, Abd Soft, No tenderness,   No Cyanosis, Clubbing or edema   Assessment/Plan:  Upper GI bleeding with acute blood loss anemia Secondary to bleeding gastric malignancy-s/p EGD with hemostatic spray 11/26 Required 3 units of PRBC so far-last transfused 11/27 Type screen repeated again on 07/25/2023 after IV fluids expected to drop, will likely require more transfusions in the next few days.  Continue PPI IV.  Recent diagnosis of gastric outlet obstruction secondary to gastric adenocarcinoma-s/p palliative gastrojejunostomy on 11/13-Dr. Pappayliou at [facility] Follow with Dr. Frances [name] at [facility], palliative care also on board but family and patient wish all aggressive treatment, in my opinion he is a candidate for medical treatment directed towards comfort, he should be DNR but family and patient resistant.    Case discussed with critical care Dr. Karie [name], oncologist Dr. Arlana [name] & palliative care team in detail on 07/25/2023, all providers agree that patient has untreatable underlying condition with extremely poor prognosis, very poor functional status.  They all suggest pursuing comfort measures and hospice after much consultation wife is agreed for DNR DNI on 07/27/2023, continue medical treatment otherwise.  Have a long discussion with patient's daughter on 07/27/2023 over the hospital room phone, she has a good understanding that her dad is not doing well and she will talk to her mother about future course of action, really told her that her dad/patient is currently dying and the best course of action is hospice.  Continue counseling.  Family meeting with palliative care on 07/31/2023.  Decision for residential hospice transfer.  Family request to continue telemonitoring and IV fluids while he is  in the hospital.  Hypokalemia.  Replaced.    Extremely poor oral intake, dysphagia, frail  status, unable to participate with PT OT, dehydration and hypernatremia.  Again supportive care, D5W and monitor.  Prognosis very poor.  Patient therapy following.  Remains at risk for aspiration.  V-fib arrest Occurred during EGD 11/26 Echo with new onset HFrEF and RV systolic dysfunction, EF 35%. Neurology following poor candidate for invasive procedure or testing due to underlying recent diagnosis of stage IV gastric cancer, continue Coreg, cannot give aspirin or Plavix due to recent GI bleed from the gastric cancer.  Monitor with supportive care.  Combined distributive/hemorrhagic shock Required pressors briefly Resolved Transfuse as needed try to keep hemoglobin above 7.5, monitor CBC  Acute hypoxic respiratory failure in the setting of V-fib arrest Extubated 11/27 Currently on 2 L of oxygen Mobilize Pulmonary toileting  New diagnosis of HFrEF EF now 30 to 35%. On Coreg, not on ACE inhibitor, ARB or Entresto due to AKI, cardiology on board, limited options for treatment kindly see #1 above.  Try to give Coreg if he is able to swallow safely, keep electrolytes stable.  Chronic atrial fibrillation [place] vas 2 score of greater than 4. Telemetry monitoring Rate well-controlled off rate control agents Placed on oral Coreg and IV Lopressor for rate control, continue to monitor.  Superficial venous thrombus-left cephalic vein Supportive care Not a candidate for anticoagulation, poor candidate for it due to recent GI bleed from the recently diagnosed gastric cancer.  HTN BP soft but stable Watch closely.  AKI with hypernatremia, dehydration due to poor oral intake. He is dehydrated, D5W, oral diet, avoid nephrotoxins, continue Foley for now.  Acute metabolic encephalopathy Multifactorial etiology-from critical illness-ICU delirium/AKI Moving all 4 extremities-somewhat redirectable Maintain delirium precautions.  Dysphagia.  Due to encephalopathy and severe deconditioning.   Unable to take oral medications or diet, clear liquid diet if tolerated, speech therapy, gentle D5 PRN, poor candidate for NG tube or PEG tube.    DM-2 (A1c 4.6 on 10/23) CBG stable on SSI  Recent Labs    08/01/23 0001 08/01/23 0443 08/01/23 0807  GLUCAP 107* 116* 104*    Palliative care Poor overall prognosis, Full code for now, Discussed with daughter/spouse at bedside-they understand tenuous situation-poor overall prognosis. Wife and patient want to pursue aggressive measures.  Palliative care, oncology and PCCM will be involved to opine.  Nutrition Status: Nutrition Problem: Moderate Malnutrition Etiology: acute illness (recently diagnosed gastric cancer) Signs/Symptoms: mild muscle depletion, mild fat depletion Interventions: Ensure Enlive (each supplement provides 350kcal and 20 grams of protein), MVI  Pressure Ulcer: Agree with assessment and plan as outlined below Pressure Injury 07/20/23 Buttocks Left Stage 2 -  Partial thickness loss of dermis presenting as a shallow open injury with a red, pink wound bed without slough. (Active)  07/20/23 1845  Location: Buttocks  Location Orientation: Left  Staging: Stage 2 -  Partial thickness loss of dermis presenting as a shallow open injury with a red, pink wound bed without slough.  Wound Description (Comments):   Present on Admission: Yes  Dressing Type Foam - Lift dressing to assess site every shift 08/01/23 0746    BMI: Estimated body mass index is 28.13 kg/m as calculated from the following:   Height as of this encounter: 5\' 11"  (1.803 m).   Weight as of this encounter: 91.5 kg.   Code status:   Code Status: Limited: Do not attempt resuscitation (DNR) -DNR-LIMITED -Do Not Intubate/DNI    DVT  Prophylaxis: SCDs Start: 07/19/23 1926   Family Communication:   Spouse  at bedside on 07/25/2023, 07/26/2023, 07/29/2023, awaiting palliative care meeting tomorrow.  Called daughter [phone] on 07/26/2023 at 8:36 AM.  Message  left  Daughter bedside on 07/26/2023 in detail, she has a good understanding that her diet is not doing well and she will talk to her mother.   Disposition Plan: Status is: Inpatient Remains inpatient appropriate because: Severity of illness   Planned Discharge Destination:Skilled nursing facility   Diet: Diet Order             DIET - DYS 1 Room service appropriate? No; Fluid consistency: Thin  Diet effective now                     Antimicrobial agents: Anti-infectives (From admission, onward)    Start     Dose/Rate Route Frequency Ordered Stop   07/23/23 1845  cefTRIAXone (ROCEPHIN) 2 g in sodium chloride 0.9 % 100 mL IVPB  Status:  Discontinued        2 g 200 mL/hr over 30 Minutes Intravenous Every 24 hours 07/23/23 1756 07/29/23 1100   07/23/23 1845  metroNIDAZOLE (FLAGYL) IVPB 500 mg  Status:  Discontinued        500 mg 100 mL/hr over 60 Minutes Intravenous Every 12 hours 07/23/23 1756 07/29/23 1100        MEDICATIONS: Scheduled Meds:  carvedilol  6.25 mg Oral BID WC   Chlorhexidine Gluconate Cloth  6 each Topical Daily   docusate  100 mg Oral BID   feeding supplement  1 Container Oral TID BM   insulin aspart  0-6 Units Subcutaneous TID WC   pantoprazole (PROTONIX) IV  40 mg Intravenous Q12H   Continuous Infusions:  dextrose 50 mL/hr at 08/01/23 0826    PRN Meds:.acetaminophen **OR** acetaminophen, bisacodyl, fentaNYL (SUBLIMAZE) injection, glycopyrrolate, HYDROmorphone (DILAUDID) injection, ipratropium-albuterol, metoprolol tartrate, ondansetron **OR** ondansetron (ZOFRAN) IV, mouth rinse   I have personally reviewed following labs and imaging studies  LABORATORY DATA: CBC: Recent Labs  Lab 07/26/23 0441 07/27/23 0503 07/28/23 0438 07/29/23 0457 07/30/23 0411  WBC 3.8* 4.9 5.0 5.0 5.6  NEUTROABS 3.0 4.0 4.0 4.1 4.5  HGB 8.1* 8.1* 7.8* 7.7* 8.1*  HCT 26.6* 27.6* 25.8* 25.6* 26.6*  MCV 96.7 95.5 97.0 94.1 93.7  PLT 86* 83* 86* 87* 108*     Basic Metabolic Panel: Recent Labs  Lab 07/26/23 0441 07/27/23 0503 07/28/23 0438 07/29/23 0457 07/30/23 0411  NA 141 154* 148* 145 148*  K 3.1* 3.8 3.7 3.4* 3.8  CL 106 119* 114* 108 113*  CO2 28 29 29 28 29   GLUCOSE 540* 95 136* 112* 82  BUN 36* 34* 29* 25* 27*  CREATININE 2.15* 2.02* 1.76* 1.55* 1.76*  CALCIUM 7.5* 8.2* 7.7* 7.9* 8.3*  MG 2.2 2.3 2.1 2.0  --   PHOS 2.9 2.2* 3.5 2.6  --     GFR: Estimated Creatinine Clearance: 44.5 mL/min (A) (by C-G formula based on SCr of 1.76 mg/dL (H)).  Liver Function Tests: No results for input(s): "AST", "ALT", "ALKPHOS", "BILITOT", "PROT", "ALBUMIN" in the last 168 hours.  Urine analysis:    Component Value Date/Time   COLORURINE YELLOW 07/09/2023 1840   APPEARANCEUR HAZY (A) 07/09/2023 1840   APPEARANCEUR Clear 05/03/2023 1056   LABSPEC 1.020 07/09/2023 1840   PHURINE 5.0 07/09/2023 1840   GLUCOSEU NEGATIVE 07/09/2023 1840   HGBUR SMALL (A) 07/09/2023 1840   BILIRUBINUR NEGATIVE 07/09/2023 1840  BILIRUBINUR Negative 05/03/2023 1056   KETONESUR 5 (A) 07/09/2023 1840   PROTEINUR 100 (A) 07/09/2023 1840   NITRITE NEGATIVE 07/09/2023 1840   LEUKOCYTESUR NEGATIVE 07/09/2023 1840    Sepsis Labs: Lactic Acid, Venous    Component Value Date/Time   LATICACIDVEN 1.0 09/06/2017 2047    MICROBIOLOGY: No results found for this or any previous visit (from the past 240 hour(s)).   RADIOLOGY STUDIES/RESULTS: No results found.   LOS: 13 days   Signature  -    Susa Raring M.D on 08/01/2023 at 9:58 AM   -  To page go to [email or web address]

## 2023-08-01 NOTE — TOC Progression Note (Addendum)
Transition of Care (TOC) - Progression Note    Patient Details  Name: [name]. MRN: [id number] Date of Birth: 1951-08-16  Transition of Care [facility]) CM/SW Contact  Eduard Roux, [place] Phone Number: 08/01/2023, 10:14 AM  Clinical Narrative:     TOC received consult - family agreeable and wants [facility]  ([facility]ally [facility])-   [place] to call daughter,[name]- per vm unable to complete call or leave message   CSW made referral and informed by intake RN will call CSW back.   TOC will continue to follow and assist with discharge planning.   Antony Blackbird, MSW, LCSW Clinical Social Worker    Expected Discharge Plan: Skilled Nursing Facility Barriers to Discharge: Continued Medical Work up  Expected Discharge Plan and Services In-house Referral: Clinical Social Work   Post Acute Care Choice: Skilled Nursing Facility Living arrangements for the past 2 months: Skilled Nursing Facility                                       Social Determinants of Health (SDOH) Interventions SDOH Screenings   Food Insecurity: No Food Insecurity (07/19/2023)  Housing: Low Risk  (07/19/2023)  Transportation Needs: No Transportation Needs (07/19/2023)  Utilities: Not At Risk (07/19/2023)  Alcohol Screen: Low Risk  (06/28/2020)  Depression (PHQ2-9): Low Risk  (05/01/2020)  Financial Resource Strain: Low Risk  (06/28/2020)  Physical Activity: Inactive (06/28/2020)  Social Connections: Moderately Integrated (06/28/2020)  Stress: No Stress Concern Present (06/28/2020)  Tobacco Use: Medium Risk (07/20/2023)    Readmission Risk Interventions    07/20/2023    7:47 AM  Readmission Risk Prevention Plan  Transportation Screening Complete  HRI or Home Care Consult Complete  Social Work Consult for Recovery Care Planning/Counseling Complete  Palliative Care Screening Not Applicable  Medication Review Oceanographer) Complete

## 2023-08-01 NOTE — Progress Notes (Signed)
Daily Progress Note   Patient Name: [name].       Date: 08/01/2023 DOB: [birth year]  Age: 72 y.o. MRN#: [id number] Attending Physician: Leroy Sea, MD Primary Care Physician: [name], MD Admit Date: 07/19/2023  Reason for Consultation/Follow-up: Non pain symptom management, Pain control, Psychosocial/spiritual support, and Terminal Care  Subjective: I have reviewed medical records including EPIC notes, MAR, and labs. Received report from primary RN - no acute concerns.   Went to visit patient at bedside - no family/visitors present. Patient was lying in bed asleep - I did not attempt to wake him to preserve comfort. No signs or non-verbal gestures of pain or discomfort noted. No respiratory distress or increased work of breathing; secretions noted. Patient has a very weak cough.    Requested RN provide dose of robinul.   Decreased IVF rate due to increased secretions and patient having a hard time coughing.  Noted patient has not required insulin coverage in several days and is no longer tolerating POs - discontinued insulin/CBG checks as well as PO medications.   TOC working on residential hospice placement.  Length of Stay: 13  Current Medications: Scheduled Meds:   carvedilol  6.25 mg Oral BID WC   Chlorhexidine Gluconate Cloth  6 each Topical Daily   docusate  100 mg Oral BID   feeding supplement  1 Container Oral TID BM   insulin aspart  0-6 Units Subcutaneous TID WC   pantoprazole (PROTONIX) IV  40 mg Intravenous Q12H    Continuous Infusions:  dextrose 50 mL/hr at 08/01/23 0826    PRN Meds: acetaminophen **OR** acetaminophen, bisacodyl, fentaNYL (SUBLIMAZE) injection, glycopyrrolate, HYDROmorphone (DILAUDID) injection, ipratropium-albuterol,  metoprolol tartrate, ondansetron **OR** ondansetron (ZOFRAN) IV, mouth rinse  Physical Exam Vitals and nursing note reviewed.  Constitutional:      General: He is not in acute distress.    Appearance: He is ill-appearing.  Pulmonary:     Effort: No respiratory distress.  Skin:    General: Skin is warm and dry.             Vital Signs: BP (!) 97/59 (BP Location: Right Arm)   Pulse 90   Temp 98.6 F (37 C) (Axillary)   Resp 20   Ht 5\' 11"  (1.803 m)   Wt 91.5 kg  SpO2 100%   BMI 28.13 kg/m  SpO2: SpO2: 100 % O2 Device: O2 Device: Nasal Cannula O2 Flow Rate: O2 Flow Rate (L/min): 2 L/min  Intake/output summary:  Intake/Output Summary (Last 24 hours) at 08/01/2023 0945 Last data filed at 08/01/2023 0831 Gross per 24 hour  Intake 855.23 ml  Output 250 ml  Net 605.23 ml   LBM: Last BM Date : 07/30/23 Baseline Weight: Weight: 90.5 kg Most recent weight: Weight: 91.5 kg       Palliative Assessment/Data: PPS 20%      Patient Active Problem List   Diagnosis Date Noted   PAF (paroxysmal atrial fibrillation) (HCC) 07/27/2023   Cardiac arrest (HCC) 07/26/2023   Failure to thrive in adult 07/25/2023   ABLA (acute blood loss anemia) 07/20/2023   Acute post-hemorrhagic anemia 07/20/2023   GI bleed 07/19/2023   Superficial vein thrombosis 07/19/2023   Acute renal failure superimposed on stage 3a chronic kidney disease (HCC) 07/08/2023   Primary gastric signet ring cell carcinoma (HCC) 07/05/2023   Gastric mass 07/02/2023   Malnutrition of moderate degree 07/02/2023   Gastric outlet obstruction 07/01/2023   Aortic atherosclerosis (HCC) 08/27/2022   Malignant neoplasm of prostate (HCC) 06/22/2022   Elevated PSA 05/14/2022   Vitamin D deficiency 01/25/2020   Uncontrolled type 2 diabetes mellitus with hyperglycemia (HCC) 12/14/2019   Cellulitis of lower extremity 12/14/2019   Autoimmune hemolytic anemia due to IgG (HCC)    Symptomatic anemia    Anemia 08/02/2019    Macrocytic anemia 08/02/2019   Atrial flutter (HCC) 09/07/2017   Bilateral pneumonia 09/07/2017   Acute respiratory failure with hypoxia (HCC) 05/19/2017   AKI (acute kidney injury) (HCC)    Sepsis due to pneumonia (HCC) 05/18/2017   Type 2 diabetes mellitus with complication, without long-term current use of insulin (HCC) 02/19/2017   Essential hypertension, benign 04/03/2015   IDA (iron deficiency anemia)    Mucosal abnormality of stomach    Hx of adenomatous colonic polyps 06/13/2012   Status post autologous bone marrow transplantation (HCC) 07/15/2011   Venous insufficiency 04/29/2011   DLBCL (diffuse large B cell lymphoma) (HCC) 02/28/2009   Mixed hyperlipidemia 02/28/2009   Class 2 severe obesity due to excess calories with serious comorbidity and body mass index (BMI) of 36.0 to 36.9 in adult [facility]) 02/28/2009   DIASTOLIC HEART FAILURE, CHRONIC 02/28/2009   EDEMA 02/28/2009    Palliative Care Assessment & Plan   Patient Profile: 72 y.o. male with past medical history of a flutter, autoimmune hemolytic anemia, CKD 3, diastolic heart failure, HTN/HLD, newly diagnosed gastric mass, diabetes, recent hospital stay with discharged to [facility] for short-term rehab admitted on 07/19/2023 with GI bleed. Hospitalization 11/7 - 11/21 for gastric outlet obstruction due to gastric adenocarcinoma with palliative gastrojejunostomy 11/13. EGD revealed large circumferential fungating malignant gastric tumor.   Assessment: Principal Problem:   GI bleed Active Problems:   DIASTOLIC HEART FAILURE, CHRONIC   Essential hypertension, benign   Type 2 diabetes mellitus with complication, without long-term current use of insulin (HCC)   Atrial flutter (HCC)   Gastric outlet obstruction   Malnutrition of moderate degree   Primary gastric signet ring cell carcinoma (HCC)   Superficial vein thrombosis   ABLA (acute blood loss anemia)   Acute post-hemorrhagic anemia   Failure to thrive in  adult   Cardiac arrest (HCC)   PAF (paroxysmal atrial fibrillation) (HCC)   Terminal care  Recommendations/Plan: Continue full comfort measures - order placed Continue DNR/DNI as  previously documented Transfer to [facility] pending confirmation of eligibility and bed availability  Continue oxygen and cardiac monitoring per family request Added orders for EOL symptom management and to reflect full comfort measures, as well as discontinued orders that were not focused on comfort Unrestricted visitation orders were placed per current [facility] E[facility] visitation policy  Nursing to provide frequent assessments and administer PRN medications as clinically necessary to ensure EOL comfort PMT will continue to follow and support holistically  Symptom Management Dilaudid PRN pain/dyspnea/increased work of breathing/RR>25 Tylenol PRN pain/fever Robinul PRN secretions Zofran PRN nausea/vomiting Liquifilm Tears PRN dry eye Duoneb PRN wheezing IVF rate decreased due to secretions    Goals of Care and Additional Recommendations: Limitations on Scope of Treatment: Full Comfort Care  Code Status:    Code Status Orders  (From admission, onward)           Start     Ordered   07/27/23 1349  Do not attempt resuscitation (DNR)- Limited -Do Not Intubate (DNI)  (Code Status)  Continuous       Question Answer Comment  If pulseless and not breathing No CPR or chest compressions.   In Pre-Arrest Conditions (Patient Is Breathing and Has A Pulse) Do not intubate. Provide all appropriate non-invasive medical interventions. Avoid ICU transfer unless indicated or required.   Consent: Discussion documented in EHR or advanced directives reviewed      07/27/23 1348           Code Status History     Date Active Date Inactive Code Status Order ID Comments User Context   07/19/2023 1612 07/27/2023 1348 Full Code [id number]  Onnie Boer, MD ED   07/01/2023 2133 07/16/2023 0239 Full  Code [id number]  Elgergawy, Leana Roe, MD ED   08/02/2019 1605 08/07/2019 1605 Full Code [id number]  Darlin Priestly, MD Inpatient   09/06/2017 2342 09/10/2017 1735 Full Code [id number]  Meredeth Ide, MD Inpatient   05/18/2017 2138 05/22/2017 0058 Full Code [id number]  Jonah Blue, MD Inpatient       Prognosis:  Hours - Days  Discharge Planning: Hospice facility vs hospital death  Care plan was discussed with primary RN  Thank you for allowing the Palliative Medicine Team to assist in the care of this patient.     Haskel Khan, NP  Please contact Palliative Medicine Team phone at [phone] for questions and concerns.   *Portions of this note are a verbal dictation therefore any spelling and/or grammatical errors are due to the "[employer] One" system interpretation.

## 2023-08-01 NOTE — Plan of Care (Addendum)
  Problem: Education: Goal: Knowledge of General Education information will improve Description: Including pain rating scale, medication(s)/side effects and non-pharmacologic comfort measures Outcome: Progressing   Problem: Health Behavior/Discharge Planning: Goal: Ability to manage health-related needs will improve Outcome: Progressing   Problem: Clinical Measurements: Goal: Ability to maintain clinical measurements within normal limits will improve Outcome: Progressing Goal: Will remain free from infection Outcome: Progressing Goal: Diagnostic test results will improve Outcome: Progressing Goal: Respiratory complications will improve Outcome: Progressing Goal: Cardiovascular complication will be avoided Outcome: Progressing   Problem: Activity: Goal: Risk for activity intolerance will decrease Outcome: Progressing   Problem: Nutrition: Goal: Adequate nutrition will be maintained Outcome: Progressing   Problem: Coping: Goal: Level of anxiety will decrease Outcome: Progressing   Problem: Elimination: Goal: Will not experience complications related to bowel motility Outcome: Progressing Goal: Will not experience complications related to urinary retention Outcome: Progressing   Problem: Pain Management: Goal: General experience of comfort will improve Outcome: Progressing   Problem: Safety: Goal: Ability to remain free from injury will improve Outcome: Progressing   Problem: Skin Integrity: Goal: Risk for impaired skin integrity will decrease Outcome: Progressing   Problem: Education: Goal: Ability to describe self-care measures that may prevent or decrease complications (Diabetes Survival Skills Education) will improve Outcome: Progressing Goal: Individualized Educational Video(s) Outcome: Progressing   Problem: Coping: Goal: Ability to adjust to condition or change in health will improve Outcome: Progressing   Problem: Fluid Volume: Goal: Ability to  maintain a balanced intake and output will improve Outcome: Progressing   Problem: Health Behavior/Discharge Planning: Goal: Ability to identify and utilize available resources and services will improve Outcome: Progressing Goal: Ability to manage health-related needs will improve Outcome: Progressing   Problem: Metabolic: Goal: Ability to maintain appropriate glucose levels will improve Outcome: Progressing   Problem: Nutritional: Goal: Maintenance of adequate nutrition will improve Outcome: Progressing Goal: Progress toward achieving an optimal weight will improve Outcome: Progressing   Problem: Skin Integrity: Goal: Risk for impaired skin integrity will decrease Outcome: Progressing   Problem: Tissue Perfusion: Goal: Adequacy of tissue perfusion will improve Outcome: Progressing   Problem: Activity: Goal: Ability to tolerate increased activity will improve Outcome: Progressing   Problem: Respiratory: Goal: Ability to maintain a clear airway and adequate ventilation will improve Outcome: Progressing   Problem: Role Relationship: Goal: Method of communication will improve Outcome: Progressing   Problem: Education: Goal: Ability to identify signs and symptoms of gastrointestinal bleeding will improve Outcome: Progressing   Problem: Bowel/Gastric: Goal: Will show no signs and symptoms of gastrointestinal bleeding Outcome: Progressing   Problem: Fluid Volume: Goal: Will show no signs and symptoms of excessive bleeding Outcome: Progressing   Problem: Clinical Measurements: Goal: Complications related to the disease process, condition or treatment will be avoided or minimized Outcome: Progressing

## 2023-08-01 NOTE — TOC Progression Note (Signed)
Transition of Care (TOC) - Progression Note    Patient Details  Name: [name]. MRN: [id number] Date of Birth: [birth year]  Transition of Care [facility]) CM/SW Contact  Eduard Roux, [place] Phone Number: 08/01/2023, 10:29 AM  Clinical Narrative:     Prudy Feeler w/ [facility]) currently does not have availability. Hospice will contact [facility] tomorrow and to confirm patient is appropriate for [facility].   TOC will continue to follow and assist with discharge planning.   Antony Blackbird, MSW, LCSW Clinical Social Worker     Expected Discharge Plan: Skilled Nursing Facility Barriers to Discharge: Continued Medical Work up  Expected Discharge Plan and Services In-house Referral: Clinical Social Work   Post Acute Care Choice: Skilled Nursing Facility Living arrangements for the past 2 months: Skilled Nursing Facility                                       Social Determinants of Health (SDOH) Interventions SDOH Screenings   Food Insecurity: No Food Insecurity (07/19/2023)  Housing: Low Risk  (07/19/2023)  Transportation Needs: No Transportation Needs (07/19/2023)  Utilities: Not At Risk (07/19/2023)  Alcohol Screen: Low Risk  (06/28/2020)  Depression (PHQ2-9): Low Risk  (05/01/2020)  Financial Resource Strain: Low Risk  (06/28/2020)  Physical Activity: Inactive (06/28/2020)  Social Connections: Moderately Integrated (06/28/2020)  Stress: No Stress Concern Present (06/28/2020)  Tobacco Use: Medium Risk (07/20/2023)    Readmission Risk Interventions    07/20/2023    7:47 AM  Readmission Risk Prevention Plan  Transportation Screening Complete  HRI or Home Care Consult Complete  Social Work Consult for Recovery Care Planning/Counseling Complete  Palliative Care Screening Not Applicable  Medication Review Oceanographer) Complete

## 2023-08-02 DIAGNOSIS — C169 Malignant neoplasm of stomach, unspecified: Secondary | ICD-10-CM | POA: Diagnosis not present

## 2023-08-02 LAB — GLUCOSE, CAPILLARY: Glucose-Capillary: 121 mg/dL — ABNORMAL HIGH (ref 70–99)

## 2023-08-03 ENCOUNTER — Encounter: Payer: Medicare Other | Admitting: Surgery

## 2023-08-10 DIAGNOSIS — C169 Malignant neoplasm of stomach, unspecified: Secondary | ICD-10-CM | POA: Diagnosis not present

## 2023-08-25 NOTE — Death Summary Note (Signed)
[name]                                                                                                                                                                                               [name], is a 73 y.o. male, DOB - [birth year], ZOX:[id number]  Admit date - August 15, 2023   Admitting Physician Ejiroghene Wendall Stade, MD  Outpatient Primary MD for the patient is [name], MD  LOS - 14  Chief Complaint  Patient presents with   Low Hemoglobin       Notification: [name], MD notified of death of 08/29/2023   Admit Date:  August 15, 2023  Date of Death: Date of Death: 08/29/2023  Time of Death: Time of Death: 0734  Length of Stay: 14    Pronounced by - RN  History of present illness:   [name]. is a 73 y.o. male with a history of - 73 y.o.  male who was recently diagnosed with gastric outlet obstruction  secondary to gastric adenocarcinoma-s/p palliative gastrojejunostomy on 11/13-presented to the hospital with upper GI bleeding in the setting of Eliquis for A-fib.  Initially admitted at [facility] he was stabilized-underwent EGD-which was complicated by V-fib arrest.  Patient was intubated and subsequently transferred to ICU here at [facility].  Echocardiogram showed new systolic dysfunction-patient was stabilized-extubated and subsequently transferred to [facility] on 11/29.  Despite appropriate treatment he continued to do poorly, he was seen by palliative care, oncology, cardiology, all the teams were of the opinion that he should be served best with comfort measures, family agreed for comfort approach few days ago, he was awaiting residential hospice bed.  He passed away on 08-29-19 4 in the morning.   Final Diagnoses:  Cause of death - gastric cancer  Signature  -    Susa Raring M.D on 08/29/23 at 7:48 AM   -  To page go to [email or web address]   Total clinical and  documentation time for today Under 30 minutes   Last [name]                           PROGRESS [name]        PATIENT DETAILS Name: [name]. Age: 73 y.o. Sex: male Date of Birth: 12-23-1950 Admit Date: 2023-08-15 Admitting Physician Ejiroghene Wendall Stade, MD WJX:BJYNW, Lyman Bishop, MD  Brief Summary: Patient is a 74 y.o.  male who was recently diagnosed with gastric outlet obstruction  secondary to gastric adenocarcinoma-s/p palliative gastrojejunostomy on 11/13-presented to the hospital with upper GI bleeding in the setting of  Eliquis for A-fib.  Initially admitted at [facility] he was stabilized-underwent EGD-which was complicated by V-fib arrest.  Patient was intubated and subsequently transferred to ICU here at [facility].  Echocardiogram showed new systolic dysfunction-patient was stabilized-extubated and subsequently transferred to [facility] on 11/29.  See below for further details.  Significant events: 11/25>> admit to [facility]-upper GI bleed-acute blood loss anemia 11/26>> EGD-active bleeding-successful hemostasis-but had V-fib arrest-intubated-transferred to ICU [facility]. 11/27>> extubated 11/29>> transferred to [facility].  Significant studies: 11/27>> EF 30-35%, RV systolic function moderately reduced.  RVSP 53.9.  Significant microbiology data: None  Procedures: 11/26>> VHQ:IONGE, fungating and ulcerated, circumferential mass with oozing bleeding and stigmata of recent bleeding was found in the gastric antrum.  Hemostatic spray applied. 11/26-11/27>> ETT  Consults: GI Palliative care PCCM Cardiology Oncology  Subjective:  Patient in bed, appears comfortable but somnolent, very frail, denies any headache chest or abdominal pain.   Objective: Vitals: Blood pressure (!) 94/52, pulse (!) 102, temperature 99.4 F (37.4 C), resp. rate (!) 23, height 5\' 11"  (1.803 m), weight 91.5 kg, SpO2 92%.   Exam:  Awake, no focal deficits, extremely frail and weak, weak cough  reflex [place].AT,PERRAL Supple Neck, No JVD,   Symmetrical Chest wall movement, Good air movement bilaterally, CTAB RRR,No Gallops, Rubs or new Murmurs,  +ve B.Sounds, Abd Soft, No tenderness,   No Cyanosis, Clubbing or edema   Assessment/Plan:  Upper GI bleeding with acute blood loss anemia Secondary to bleeding gastric malignancy-s/p EGD with hemostatic spray 11/26 Required 3 units of PRBC so far-last transfused 11/27 Type screen repeated again on 07/25/2023 after IV fluids expected to drop, will likely require more transfusions in the next few days.  Continue PPI IV.  Recent diagnosis of gastric outlet obstruction secondary to gastric adenocarcinoma-s/p palliative gastrojejunostomy on 11/13-Dr. Pappayliou at [facility] Follow with Dr. Frances Maywood at [facility], palliative care also on board but family and patient wish all aggressive treatment, in my opinion he is a candidate for medical treatment directed towards comfort, he should be DNR but family and patient resistant.    Case discussed with critical care Dr. Karie Fetch, oncologist Dr. Arlana Pouch & palliative care team in detail on 07/25/2023, all providers agree that patient has untreatable underlying condition with extremely poor prognosis, very poor functional status.  They all suggest pursuing comfort measures and hospice after much consultation wife is agreed for DNR DNI on 07/27/2023, continue medical treatment otherwise.  Have a long discussion with patient's daughter on 07/27/2023 over the hospital room phone, she has a good understanding that her dad is not doing well and she will talk to her mother about future course of action, really told her that her dad/patient is currently dying and the best course of action is hospice.  Continue counseling.  Family meeting with palliative care on 07/31/2023.  Decision for residential hospice transfer.  Family request to continue telemonitoring and IV fluids while he is in the  hospital.  Hypokalemia.  Replaced.    Extremely poor oral intake, dysphagia, frail status, unable to participate with PT OT, dehydration and hypernatremia.  Again supportive care, D5W and monitor.  Prognosis very poor.  Patient therapy following.  Remains at risk for aspiration.  V-fib arrest Occurred during EGD 11/26 Echo with new onset HFrEF and RV systolic dysfunction, EF 35%. Neurology following poor candidate for invasive procedure or testing due to underlying recent diagnosis of stage IV gastric cancer, continue Coreg, cannot give aspirin or Plavix due to recent GI bleed from the  gastric cancer.  Monitor with supportive care.  Combined distributive/hemorrhagic shock Required pressors briefly Resolved Transfuse as needed try to keep hemoglobin above 7.5, monitor CBC  Acute hypoxic respiratory failure in the setting of V-fib arrest Extubated 11/27 Currently on 2 L of oxygen Mobilize Pulmonary toileting  New diagnosis of HFrEF EF now 30 to 35%. On Coreg, not on ACE inhibitor, ARB or Entresto due to AKI, cardiology on board, limited options for treatment kindly see #1 above.  Try to give Coreg if he is able to swallow safely, keep electrolytes stable.  Chronic atrial fibrillation [place] vas 2 score of greater than 4. Telemetry monitoring Rate well-controlled off rate control agents Placed on oral Coreg and IV Lopressor for rate control, continue to monitor.  Superficial venous thrombus-left cephalic vein Supportive care Not a candidate for anticoagulation, poor candidate for it due to recent GI bleed from the recently diagnosed gastric cancer.  HTN BP soft but stable Watch closely.  AKI with hypernatremia, dehydration due to poor oral intake. He is dehydrated, D5W, oral diet, avoid nephrotoxins, continue Foley for now.  Acute metabolic encephalopathy Multifactorial etiology-from critical illness-ICU delirium/AKI Moving all 4 extremities-somewhat redirectable Maintain  delirium precautions.  Dysphagia.  Due to encephalopathy and severe deconditioning.  Unable to take oral medications or diet, clear liquid diet if tolerated, speech therapy, gentle D5 PRN, poor candidate for NG tube or PEG tube.    DM-2 (A1c 4.6 on 10/23) CBG stable on SSI  Recent Labs    08/01/23 0001 08/01/23 0443 08/01/23 0807  GLUCAP 107* 116* 104*    Palliative care Poor overall prognosis, Full code for now, Discussed with daughter/spouse at bedside-they understand tenuous situation-poor overall prognosis. Wife and patient want to pursue aggressive measures.  Palliative care, oncology and PCCM will be involved to opine.  Nutrition Status: Nutrition Problem: Moderate Malnutrition Etiology: acute illness (recently diagnosed gastric cancer) Signs/Symptoms: mild muscle depletion, mild fat depletion Interventions: Ensure Enlive (each supplement provides 350kcal and 20 grams of protein), MVI  Pressure Ulcer: Agree with assessment and plan as outlined below Pressure Injury 07/20/23 Buttocks Left Stage 2 -  Partial thickness loss of dermis presenting as a shallow open injury with a red, pink wound bed without slough. (Active)  07/20/23 1845  Location: Buttocks  Location Orientation: Left  Staging: Stage 2 -  Partial thickness loss of dermis presenting as a shallow open injury with a red, pink wound bed without slough.  Wound Description (Comments):   Present on Admission: Yes  Dressing Type Foam - Lift dressing to assess site every shift 08/01/23 2011    BMI: Estimated body mass index is 28.13 kg/m as calculated from the following:   Height as of this encounter: 5\' 11"  (1.803 m).   Weight as of this encounter: 91.5 kg.   Code status:   Code Status: Limited: Do not attempt resuscitation (DNR) -DNR-LIMITED -Do Not Intubate/DNI    DVT Prophylaxis:    Family Communication:   Spouse  at bedside on 07/25/2023, 07/26/2023, 07/29/2023, awaiting palliative care meeting  tomorrow.  Called daughter [phone] on 07/26/2023 at 8:36 AM.  Message left  Daughter bedside on 07/26/2023 in detail, she has a good understanding that her diet is not doing well and she will talk to her mother.   Disposition Plan: Status is: Inpatient Remains inpatient appropriate because: Severity of illness   Planned Discharge Destination:Skilled nursing facility   Diet: Diet Order             DIET -  DYS 1 Room service appropriate? No; Fluid consistency: Thin  Diet effective now                     Antimicrobial agents: Anti-infectives (From admission, onward)    Start     Dose/Rate Route Frequency Ordered Stop   07/23/23 1845  cefTRIAXone (ROCEPHIN) 2 g in sodium chloride 0.9 % 100 mL IVPB  Status:  Discontinued        2 g 200 mL/hr over 30 Minutes Intravenous Every 24 hours 07/23/23 1756 07/29/23 1100   07/23/23 1845  metroNIDAZOLE (FLAGYL) IVPB 500 mg  Status:  Discontinued        500 mg 100 mL/hr over 60 Minutes Intravenous Every 12 hours 07/23/23 1756 07/29/23 1100        MEDICATIONS: Scheduled Meds:  pantoprazole (PROTONIX) IV  40 mg Intravenous Q12H   Continuous Infusions:  dextrose 10 mL/hr at 08/01/23 1047    PRN Meds:.acetaminophen **OR** acetaminophen, bisacodyl, feeding supplement, glycopyrrolate, HYDROmorphone (DILAUDID) injection, ipratropium-albuterol, ondansetron **OR** ondansetron (ZOFRAN) IV, mouth rinse   I have personally reviewed following labs and imaging studies  LABORATORY DATA: CBC: Recent Labs  Lab 07/27/23 0503 07/28/23 0438 07/29/23 0457 07/30/23 0411  WBC 4.9 5.0 5.0 5.6  NEUTROABS 4.0 4.0 4.1 4.5  HGB 8.1* 7.8* 7.7* 8.1*  HCT 27.6* 25.8* 25.6* 26.6*  MCV 95.5 97.0 94.1 93.7  PLT 83* 86* 87* 108*    Basic Metabolic Panel: Recent Labs  Lab 07/27/23 0503 07/28/23 0438 07/29/23 0457 07/30/23 0411  NA 154* 148* 145 148*  K 3.8 3.7 3.4* 3.8  CL 119* 114* 108 113*  CO2 29 29 28 29   GLUCOSE 95 136* 112* 82   BUN 34* 29* 25* 27*  CREATININE 2.02* 1.76* 1.55* 1.76*  CALCIUM 8.2* 7.7* 7.9* 8.3*  MG 2.3 2.1 2.0  --   PHOS 2.2* 3.5 2.6  --     GFR: Estimated Creatinine Clearance: 44.5 mL/min (A) (by C-G formula based on SCr of 1.76 mg/dL (H)).  Liver Function Tests: No results for input(s): "AST", "ALT", "ALKPHOS", "BILITOT", "PROT", "ALBUMIN" in the last 168 hours.  Urine analysis:    Component Value Date/Time   COLORURINE YELLOW 07/09/2023 1840   APPEARANCEUR HAZY (A) 07/09/2023 1840   APPEARANCEUR Clear 05/03/2023 1056   LABSPEC 1.020 07/09/2023 1840   PHURINE 5.0 07/09/2023 1840   GLUCOSEU NEGATIVE 07/09/2023 1840   HGBUR SMALL (A) 07/09/2023 1840   BILIRUBINUR NEGATIVE 07/09/2023 1840   BILIRUBINUR Negative 05/03/2023 1056   KETONESUR 5 (A) 07/09/2023 1840   PROTEINUR 100 (A) 07/09/2023 1840   NITRITE NEGATIVE 07/09/2023 1840   LEUKOCYTESUR NEGATIVE 07/09/2023 1840    Sepsis Labs: Lactic Acid, Venous    Component Value Date/Time   LATICACIDVEN 1.0 09/06/2017 2047    MICROBIOLOGY: No results found for this or any previous visit (from the past 240 hour(s)).   RADIOLOGY STUDIES/RESULTS: No results found.   LOS: 14 days   Signature  -    Susa Raring M.D on 08/18/2023 at 7:48 AM   -  To page go to [email or web address]

## 2023-08-25 NOTE — Progress Notes (Signed)
MD Thedore Mins at bedside on phone notifying patients wife of TOD: 7:34am. [facility] bridge called.

## 2023-08-25 NOTE — Progress Notes (Signed)
Brief Palliative Medicine Progress Note:  Chart reviewed - noted patient passed away at 0734am today on full comfort measures. Family was notified.  Thank you for allowing PMT to assist in the care of this patient.  Giovanne Nickolson M. [facility] Palliative Medicine Team Team Phone: [phone] NO CHARGE

## 2023-08-25 NOTE — Plan of Care (Signed)
  Problem: Health Behavior/Discharge Planning: Goal: Ability to manage health-related needs will improve Outcome: Progressing   Problem: Coping: Goal: Level of anxiety will decrease Outcome: Progressing   Problem: Elimination: Goal: Will not experience complications related to urinary retention Outcome: Progressing   Problem: Safety: Goal: Ability to remain free from injury will improve Outcome: Progressing

## 2023-08-25 DEATH — deceased

## 2023-09-21 ENCOUNTER — Ambulatory Visit: Payer: Medicare Other | Admitting: Nurse Practitioner

## 2023-10-18 ENCOUNTER — Ambulatory Visit: Payer: Medicare Other | Admitting: Surgery

## 2023-11-01 ENCOUNTER — Other Ambulatory Visit: Payer: Medicare Other

## 2023-11-10 ENCOUNTER — Ambulatory Visit: Payer: Medicare Other | Admitting: Urology
